# Patient Record
Sex: Male | Born: 1950
Health system: Southern US, Community
[De-identification: ages and names within clinical notes are randomized; demographics above are authoritative.]

## PROBLEM LIST (undated history)

## (undated) DIAGNOSIS — H9319 Tinnitus, unspecified ear: Secondary | ICD-10-CM

## (undated) DIAGNOSIS — E0590 Thyrotoxicosis, unspecified without thyrotoxic crisis or storm: Secondary | ICD-10-CM

## (undated) DIAGNOSIS — R0600 Dyspnea, unspecified: Secondary | ICD-10-CM

## (undated) DIAGNOSIS — I4892 Unspecified atrial flutter: Secondary | ICD-10-CM

## (undated) DIAGNOSIS — Z923 Personal history of irradiation: Secondary | ICD-10-CM

## (undated) DIAGNOSIS — C801 Malignant (primary) neoplasm, unspecified: Secondary | ICD-10-CM

## (undated) DIAGNOSIS — I499 Cardiac arrhythmia, unspecified: Secondary | ICD-10-CM

## (undated) DIAGNOSIS — K219 Gastro-esophageal reflux disease without esophagitis: Secondary | ICD-10-CM

## (undated) DIAGNOSIS — G4700 Insomnia, unspecified: Secondary | ICD-10-CM

## (undated) DIAGNOSIS — E785 Hyperlipidemia, unspecified: Secondary | ICD-10-CM

## (undated) DIAGNOSIS — Z87442 Personal history of urinary calculi: Secondary | ICD-10-CM

## (undated) DIAGNOSIS — R200 Anesthesia of skin: Secondary | ICD-10-CM

## (undated) DIAGNOSIS — G43909 Migraine, unspecified, not intractable, without status migrainosus: Secondary | ICD-10-CM

## (undated) DIAGNOSIS — K589 Irritable bowel syndrome without diarrhea: Secondary | ICD-10-CM

## (undated) DIAGNOSIS — F419 Anxiety disorder, unspecified: Secondary | ICD-10-CM

## (undated) DIAGNOSIS — R42 Dizziness and giddiness: Secondary | ICD-10-CM

## (undated) DIAGNOSIS — M1990 Unspecified osteoarthritis, unspecified site: Secondary | ICD-10-CM

## (undated) DIAGNOSIS — I4891 Unspecified atrial fibrillation: Secondary | ICD-10-CM

## (undated) DIAGNOSIS — R7303 Prediabetes: Secondary | ICD-10-CM

## (undated) DIAGNOSIS — I10 Essential (primary) hypertension: Secondary | ICD-10-CM

## (undated) DIAGNOSIS — N400 Enlarged prostate without lower urinary tract symptoms: Secondary | ICD-10-CM

## (undated) DIAGNOSIS — T148XXA Other injury of unspecified body region, initial encounter: Secondary | ICD-10-CM

## (undated) DIAGNOSIS — K649 Unspecified hemorrhoids: Secondary | ICD-10-CM

## (undated) DIAGNOSIS — C61 Malignant neoplasm of prostate: Secondary | ICD-10-CM

## (undated) DIAGNOSIS — R079 Chest pain, unspecified: Secondary | ICD-10-CM

## (undated) DIAGNOSIS — R202 Paresthesia of skin: Secondary | ICD-10-CM

## (undated) HISTORY — DX: Insomnia, unspecified: G47.00

## (undated) HISTORY — PX: RADIOACTIVE SEED IMPLANT: SHX5150

## (undated) HISTORY — DX: Tinnitus, unspecified ear: H93.19

## (undated) HISTORY — DX: Unspecified atrial fibrillation: I48.91

## (undated) HISTORY — DX: Hyperlipidemia, unspecified: E78.5

## (undated) HISTORY — DX: Anxiety disorder, unspecified: F41.9

## (undated) HISTORY — DX: Irritable bowel syndrome, unspecified: K58.9

## (undated) HISTORY — DX: Migraine, unspecified, not intractable, without status migrainosus: G43.909

## (undated) HISTORY — DX: Essential (primary) hypertension: I10

## (undated) HISTORY — DX: Unspecified atrial flutter: I48.92

## (undated) HISTORY — DX: Unspecified osteoarthritis, unspecified site: M19.90

## (undated) HISTORY — DX: Chest pain, unspecified: R07.9

## (undated) HISTORY — DX: Personal history of irradiation: Z92.3

---

## 1980-07-24 HISTORY — PX: KIDNEY SURGERY: SHX687

## 2002-10-14 ENCOUNTER — Emergency Department (HOSPITAL_COMMUNITY): Admission: EM | Admit: 2002-10-14 | Discharge: 2002-10-14 | Payer: Self-pay

## 2007-03-19 ENCOUNTER — Ambulatory Visit (HOSPITAL_COMMUNITY): Admission: RE | Admit: 2007-03-19 | Discharge: 2007-03-19 | Payer: Self-pay | Admitting: Internal Medicine

## 2010-09-23 ENCOUNTER — Ambulatory Visit (INDEPENDENT_AMBULATORY_CARE_PROVIDER_SITE_OTHER): Payer: Self-pay | Admitting: Urology

## 2010-09-23 DIAGNOSIS — R972 Elevated prostate specific antigen [PSA]: Secondary | ICD-10-CM

## 2010-09-23 DIAGNOSIS — N529 Male erectile dysfunction, unspecified: Secondary | ICD-10-CM

## 2010-09-23 DIAGNOSIS — N400 Enlarged prostate without lower urinary tract symptoms: Secondary | ICD-10-CM

## 2010-10-10 ENCOUNTER — Encounter (HOSPITAL_COMMUNITY): Payer: Self-pay | Attending: Urology

## 2010-10-10 ENCOUNTER — Other Ambulatory Visit: Payer: Self-pay | Admitting: Urology

## 2010-10-10 DIAGNOSIS — Z01810 Encounter for preprocedural cardiovascular examination: Secondary | ICD-10-CM | POA: Insufficient documentation

## 2010-10-10 DIAGNOSIS — Z01812 Encounter for preprocedural laboratory examination: Secondary | ICD-10-CM | POA: Insufficient documentation

## 2010-10-10 LAB — BASIC METABOLIC PANEL
BUN: 10 mg/dL (ref 6–23)
CO2: 30 mEq/L (ref 19–32)
Calcium: 9.6 mg/dL (ref 8.4–10.5)
Chloride: 100 mEq/L (ref 96–112)
Creatinine, Ser: 0.95 mg/dL (ref 0.4–1.5)
GFR calc Af Amer: >60 mL/min (ref >60)
GFR calc non Af Amer: >60 mL/min (ref >60)
Glucose, Bld: 108 mg/dL — ABNORMAL HIGH (ref 70–99)
Potassium: 2.9 mEq/L — ABNORMAL LOW (ref 3.5–5.1)
Sodium: 142 mEq/L (ref 135–145)

## 2010-10-10 LAB — SURGICAL PCR SCREEN
MRSA, PCR: NEGATIVE
Staphylococcus aureus: NEGATIVE

## 2010-10-10 LAB — HEMOGLOBIN AND HEMATOCRIT, BLOOD
HCT: 42.8 % (ref 39.0–52.0)
Hemoglobin: 14.7 g/dL (ref 13.0–17.0)

## 2010-10-13 ENCOUNTER — Encounter (HOSPITAL_COMMUNITY): Payer: Self-pay | Attending: Urology

## 2010-10-13 ENCOUNTER — Other Ambulatory Visit: Payer: Self-pay | Admitting: Urology

## 2010-10-13 DIAGNOSIS — Z01812 Encounter for preprocedural laboratory examination: Secondary | ICD-10-CM | POA: Insufficient documentation

## 2010-10-13 DIAGNOSIS — R972 Elevated prostate specific antigen [PSA]: Secondary | ICD-10-CM | POA: Insufficient documentation

## 2010-10-13 LAB — POTASSIUM: Potassium: 3.1 mEq/L — ABNORMAL LOW (ref 3.5–5.1)

## 2010-10-14 ENCOUNTER — Other Ambulatory Visit: Payer: Self-pay | Admitting: Urology

## 2010-10-14 ENCOUNTER — Ambulatory Visit (HOSPITAL_COMMUNITY): Payer: Self-pay

## 2010-10-14 ENCOUNTER — Ambulatory Visit (HOSPITAL_COMMUNITY)
Admission: RE | Admit: 2010-10-14 | Discharge: 2010-10-14 | Disposition: A | Discharge status: Home / Self Care | Payer: Self-pay | Source: Ambulatory Visit | Attending: Urology | Admitting: Urology

## 2010-10-14 DIAGNOSIS — C61 Malignant neoplasm of prostate: Secondary | ICD-10-CM | POA: Insufficient documentation

## 2010-10-14 DIAGNOSIS — I10 Essential (primary) hypertension: Secondary | ICD-10-CM | POA: Insufficient documentation

## 2010-10-14 DIAGNOSIS — Z79899 Other long term (current) drug therapy: Secondary | ICD-10-CM | POA: Insufficient documentation

## 2010-10-14 DIAGNOSIS — R972 Elevated prostate specific antigen [PSA]: Secondary | ICD-10-CM | POA: Insufficient documentation

## 2010-10-14 DIAGNOSIS — Z7982 Long term (current) use of aspirin: Secondary | ICD-10-CM | POA: Insufficient documentation

## 2010-10-18 NOTE — Op Note (Signed)
  NAME:  Steven [name], Steven [name]                ACCOUNT NO.:  [id number]  MEDICAL RECORD NO.:  [id number]           PATIENT TYPE:  O  LOCATION:  DAYP                          FACILITY:  [facility]  PHYSICIAN:  Excell Seltzer. Annabell Howells, M.D.    DATE OF BIRTH:  [birth year]  DATE OF PROCEDURE:  10/14/2010 DATE OF DISCHARGE:                              OPERATIVE REPORT   PROCEDURE:  Prostate ultrasound with ultrasound-guided prostate biopsy.  PREOPERATIVE DIAGNOSIS:  Elevated PSA.  POSTOPERATIVE DIAGNOSIS:  Elevated PSA.  SURGEON:  Excell Seltzer. Annabell Howells, MD  ANESTHESIA:  MAC.  SPECIMEN:  Twelve prostate cores.  COMPLICATIONS:  None.  BLOOD LOSS:  Minimal.  INDICATIONS:  [name] is a 60 year old African American male with an elevated PSA of 4.73 who is to undergo an ultrasound-guided prostate biopsy.  FINDINGS OF PROCEDURE:  He was given preoperative antibiotics with Cipro 400 mg IV and gentamicin 160 mg IV.  He was taken to the operating room where he was placed in lithotomy position and MAC anesthesia was induced.  The ultrasound probe was assembled, imbricated and inserted. Scanning was performed.  The study revealed normal seminal vesicles. There was some minor cystic changes in the apical transitional zone but no other hypoechoic lesions or calcifications were noted.  He had only a small middle lobe.  His prostatic dimensions were at length of 3.95 cm, a height of 3.67 cm, and with 5.49 cm with a volume of 41.7 mL.  After completion of diagnostic scan, he underwent 12-core biopsies in the standard configuration with lateral and medial biopsies from the base, mid and apical prostate on each side.  There was minimal bleeding from the rectum associated with the biopsies and after completion of the biopsies, the probe was removed.  The patient was taken down from lithotomy position. His anesthetic was reversed.  He was made the recovery room in stable condition.  There were no complications.     Excell Seltzer.  Annabell Howells, M.D.     JJW/MEDQ  D:  10/14/2010  T:  10/14/2010  Job:  [id number]  cc:   [facility] Department  Electronically Signed by Bjorn Pippin M.D. on 10/18/2010 11:21:19 AM

## 2010-11-11 ENCOUNTER — Ambulatory Visit (INDEPENDENT_AMBULATORY_CARE_PROVIDER_SITE_OTHER): Payer: Self-pay | Admitting: Urology

## 2010-11-11 DIAGNOSIS — C61 Malignant neoplasm of prostate: Secondary | ICD-10-CM

## 2010-11-21 ENCOUNTER — Ambulatory Visit: Payer: Self-pay | Admitting: Radiation Oncology

## 2010-12-12 ENCOUNTER — Ambulatory Visit
Admission: RE | Admit: 2010-12-12 | Discharge: 2010-12-12 | Disposition: A | Discharge status: Home / Self Care | Payer: Self-pay | Source: Ambulatory Visit | Attending: Radiation Oncology | Admitting: Radiation Oncology

## 2010-12-12 DIAGNOSIS — C61 Malignant neoplasm of prostate: Secondary | ICD-10-CM | POA: Insufficient documentation

## 2010-12-12 DIAGNOSIS — M129 Arthropathy, unspecified: Secondary | ICD-10-CM | POA: Insufficient documentation

## 2010-12-12 DIAGNOSIS — Z7982 Long term (current) use of aspirin: Secondary | ICD-10-CM | POA: Insufficient documentation

## 2010-12-12 DIAGNOSIS — Z87891 Personal history of nicotine dependence: Secondary | ICD-10-CM | POA: Insufficient documentation

## 2010-12-12 DIAGNOSIS — I10 Essential (primary) hypertension: Secondary | ICD-10-CM | POA: Insufficient documentation

## 2010-12-12 DIAGNOSIS — Z8042 Family history of malignant neoplasm of prostate: Secondary | ICD-10-CM | POA: Insufficient documentation

## 2010-12-12 DIAGNOSIS — Z79899 Other long term (current) drug therapy: Secondary | ICD-10-CM | POA: Insufficient documentation

## 2011-04-10 ENCOUNTER — Ambulatory Visit
Admission: RE | Admit: 2011-04-10 | Discharge: 2011-04-10 | Disposition: A | Discharge status: Home / Self Care | Payer: Self-pay | Source: Ambulatory Visit | Attending: Radiation Oncology | Admitting: Radiation Oncology

## 2011-07-19 ENCOUNTER — Telehealth: Payer: Self-pay | Admitting: *Deleted

## 2011-07-19 NOTE — Telephone Encounter (Signed)
[name] from [facility] stating they were putting in gold seeds their office today, will have pt call Steven [name] to have follow up with [name] 4:37 PM

## 2011-07-21 ENCOUNTER — Telehealth: Payer: Self-pay | Admitting: *Deleted

## 2011-07-26 ENCOUNTER — Ambulatory Visit: Admission: RE | Admit: 2011-07-26 | Payer: Self-pay | Source: Ambulatory Visit

## 2011-07-26 ENCOUNTER — Ambulatory Visit: Admission: RE | Admit: 2011-07-26 | Payer: Self-pay | Source: Ambulatory Visit | Admitting: Radiation Oncology

## 2011-08-09 ENCOUNTER — Ambulatory Visit
Admission: RE | Admit: 2011-08-09 | Discharge: 2011-08-09 | Disposition: A | Discharge status: Home / Self Care | Payer: Self-pay | Source: Ambulatory Visit | Attending: Radiation Oncology | Admitting: Radiation Oncology

## 2011-08-09 DIAGNOSIS — F419 Anxiety disorder, unspecified: Secondary | ICD-10-CM

## 2011-08-09 DIAGNOSIS — F411 Generalized anxiety disorder: Secondary | ICD-10-CM | POA: Insufficient documentation

## 2011-08-09 DIAGNOSIS — C61 Malignant neoplasm of prostate: Secondary | ICD-10-CM

## 2011-08-09 MED ORDER — LORAZEPAM 1 MG PO TABS
1.0000 mg | ORAL_TABLET | Freq: Once | ORAL | Status: AC
  Administered 2011-08-09: 1 mg via SUBLINGUAL
  Filled 2011-08-09: qty 1

## 2011-08-09 NOTE — Progress Notes (Signed)
Met with patient to discuss RO billing.   Gave patient epp and another MCD application.  Patient stated he has applied previously, but was denied.  Patient is very concerned about bills. Will return paperwork on return visit 11/28/152.  Called Kathrin Penner, CSW, to meet with patient to discuss any further transportation options.

## 2011-08-09 NOTE — Progress Notes (Signed)
When pt was leaving at 1225, met pt at the elevator,  pt ambulating with  steady gait, alert oriented x 3, thanked me &  said"I got through that ok, that medication helped a lot, " son,&  wife at pts side,2:22 PM

## 2011-08-13 DIAGNOSIS — C61 Malignant neoplasm of prostate: Secondary | ICD-10-CM

## 2011-08-13 NOTE — Progress Notes (Signed)
DIAGNOSIS:  Prostate cancer.  NARRATIVE:  On August 08, 2010 Mr. Morikawa underwent treatment planning to begin radiation therapy directed at the prostate area.  The patient was placed on the CT simulator table. Prior to this, the patient was given 0.5 mg of Ativan sublingual in light of his anxiety.  The patient had setup of a customized alpha cradle for positioning purposes. Patient then had a rectal tube placed with contrast instilled into the rectal vault.  The patient also had a bladder catheterization with contrast instilled into the bladder and urethra.  The patient then proceeded to undergo CT scan in the treatment position.  Under virtual simulation, the patient's prostate was noted and outlined as well as the fiducial markers.  The bladder as well as rectum and femoral head and neck areas were also outlined.  Patient then had generation of a PTV 7800 cGy, which will encompass the prostate with an 8 mm margin except on the rectal interface that being 5 mm.  The patient will proceed with planning using IMRT (volumetric arc therapy).  TREATMENT PLAN:  The patient is to proceed with IMRT using the volumetric arc therapy on the TrueBeam STx.  The patient be treated with 2 RapidArc using 6 or 10 mg photons.  The patient will receive 40 treatments at 195 cGy per day for a cumulative dose of 7800 cGy.    ______________________________ Billie Lade, Ph.D., M.D. JDK/MEDQ  D:  08/13/2011  T:  08/13/2011  Job:  [id number]

## 2011-08-21 ENCOUNTER — Telehealth: Payer: Self-pay | Admitting: Radiation Oncology

## 2011-08-21 ENCOUNTER — Ambulatory Visit
Admission: RE | Admit: 2011-08-21 | Discharge: 2011-08-21 | Disposition: A | Discharge status: Home / Self Care | Payer: Self-pay | Source: Ambulatory Visit | Attending: Radiation Oncology | Admitting: Radiation Oncology

## 2011-08-21 NOTE — Telephone Encounter (Signed)
INDIGENT APPROVED 100%  Patient recvd a $50 gas card today. Pt has no insurance.  [facility]  [phone]

## 2011-08-22 ENCOUNTER — Ambulatory Visit
Admission: RE | Admit: 2011-08-22 | Discharge: 2011-08-22 | Disposition: A | Discharge status: Home / Self Care | Payer: Self-pay | Source: Ambulatory Visit | Attending: Radiation Oncology | Admitting: Radiation Oncology

## 2011-08-22 VITALS — Wt 208.2 lb

## 2011-08-22 DIAGNOSIS — C61 Malignant neoplasm of prostate: Secondary | ICD-10-CM

## 2011-08-22 NOTE — Progress Notes (Signed)
Post sim ed done, gave pt "Radiation and You" booklet. All questions answered.

## 2011-08-23 ENCOUNTER — Ambulatory Visit
Admission: RE | Admit: 2011-08-23 | Discharge: 2011-08-23 | Disposition: A | Discharge status: Home / Self Care | Payer: Self-pay | Source: Ambulatory Visit | Attending: Radiation Oncology | Admitting: Radiation Oncology

## 2011-08-23 NOTE — Progress Notes (Signed)
CC:   Excell Seltzer. Annabell Howells, M.D. Health Department [facility]  DIAGNOSIS:  Prostate cancer.  NARRATIVE:  Mr. Dotson is seen today for weekly assessment.  He has completed 2/40 planned treatments directed at the prostate area (390 cGy of a planned 7800 cGy).  The patient is tolerating his treatments well at this time without any side effects.  EXAMINATION:  The patient's weight is 208.2 pounds.  Lungs:  The lungs are clear.  Heart:  The heart has a regular rhythm and rate.  Abdomen: Soft and nontender with normal bowel sounds.  IMPRESSION AND PLAN:  The patient is tolerating his radiation treatments well.  His radiation fields are setting up accurately.  The patient's radiation chart was checked today.  Plan is to continue to a cumulative dose of 7800 cGy.    ______________________________ Billie Lade, Ph.D., M.D. JDK/MEDQ  D:  08/22/2011  T:  08/22/2011  Job:  [id number]

## 2011-08-24 ENCOUNTER — Ambulatory Visit
Admission: RE | Admit: 2011-08-24 | Discharge: 2011-08-24 | Disposition: A | Discharge status: Home / Self Care | Payer: Self-pay | Source: Ambulatory Visit | Attending: Radiation Oncology | Admitting: Radiation Oncology

## 2011-08-25 ENCOUNTER — Ambulatory Visit
Admission: RE | Admit: 2011-08-25 | Discharge: 2011-08-25 | Disposition: A | Discharge status: Home / Self Care | Payer: Self-pay | Source: Ambulatory Visit | Attending: Radiation Oncology | Admitting: Radiation Oncology

## 2011-08-28 ENCOUNTER — Ambulatory Visit
Admission: RE | Admit: 2011-08-28 | Discharge: 2011-08-28 | Disposition: A | Discharge status: Home / Self Care | Payer: Self-pay | Source: Ambulatory Visit | Attending: Radiation Oncology | Admitting: Radiation Oncology

## 2011-08-29 ENCOUNTER — Ambulatory Visit
Admission: RE | Admit: 2011-08-29 | Discharge: 2011-08-29 | Disposition: A | Discharge status: Home / Self Care | Payer: Self-pay | Source: Ambulatory Visit | Attending: Radiation Oncology | Admitting: Radiation Oncology

## 2011-08-29 ENCOUNTER — Encounter: Payer: Self-pay | Admitting: Radiation Oncology

## 2011-08-29 VITALS — BP 116/71 | HR 69 | Temp 97.3°F | Resp 20 | Wt 205.6 lb

## 2011-08-29 DIAGNOSIS — C61 Malignant neoplasm of prostate: Secondary | ICD-10-CM

## 2011-08-29 NOTE — Progress Notes (Signed)
Pt gets up 1 x night to void,  up at 5 am, then voids q 2 hours, not emptying bladder at at times completely, having trouble constipation, takes miralax daily prn, pt wanting b/p checked, wnl,  .now

## 2011-08-30 ENCOUNTER — Ambulatory Visit
Admission: RE | Admit: 2011-08-30 | Discharge: 2011-08-30 | Disposition: A | Discharge status: Home / Self Care | Payer: Self-pay | Source: Ambulatory Visit | Attending: Radiation Oncology | Admitting: Radiation Oncology

## 2011-08-30 NOTE — Progress Notes (Signed)
DIAGNOSIS:  Prostate cancer.  NARRATIVE:  Steven [name] is seen today for weekly assessment.  He has completed 7/40 planned treatments directed at the prostate area (1365 cGy of a planned 7800 cGy).  The patient is tolerating his treatments well.  He has his usual nocturia which varies between one and four times per evening.  The patient does sometimes sleep, however, 12 hours in light of his late shift schedule.  The patient denies any dysuria or bowel complaints.  He has actually had some intermittent mild constipation.  The patient denies any fatigue at this time.  PHYSICAL EXAMINATION:  Vital Signs:  The patient's temperature is 97.3, pulse 69, respirations 20, blood pressure is 116/71.  The patient's weight is 205.6 pounds.  The lungs are clear.  The heart has a regular rhythm and rate.  The abdomen is soft and nontender with normal bowel sounds.  IMPRESSION AND PLAN:  The patient is tolerating his treatments well at this time.  The patient's radiation fields are setting up accurately. The patient's radiation chart was checked today.  The plan is to continue with image-guided IMRT to a cumulative dose of 7800 cGy.    ______________________________ Billie Lade, Ph.D., M.D. JDK/MEDQ  D:  08/29/2011  T:  08/30/2011  Job:  [id number]

## 2011-08-31 ENCOUNTER — Ambulatory Visit
Admission: RE | Admit: 2011-08-31 | Discharge: 2011-08-31 | Disposition: A | Discharge status: Home / Self Care | Payer: Self-pay | Source: Ambulatory Visit | Attending: Radiation Oncology | Admitting: Radiation Oncology

## 2011-09-01 ENCOUNTER — Ambulatory Visit
Admission: RE | Admit: 2011-09-01 | Discharge: 2011-09-01 | Disposition: A | Discharge status: Home / Self Care | Payer: Self-pay | Source: Ambulatory Visit | Attending: Radiation Oncology | Admitting: Radiation Oncology

## 2011-09-04 ENCOUNTER — Ambulatory Visit
Admission: RE | Admit: 2011-09-04 | Discharge: 2011-09-04 | Disposition: A | Discharge status: Home / Self Care | Payer: Self-pay | Source: Ambulatory Visit | Attending: Radiation Oncology | Admitting: Radiation Oncology

## 2011-09-05 ENCOUNTER — Ambulatory Visit
Admission: RE | Admit: 2011-09-05 | Discharge: 2011-09-05 | Disposition: A | Discharge status: Home / Self Care | Payer: Self-pay | Source: Ambulatory Visit | Attending: Radiation Oncology | Admitting: Radiation Oncology

## 2011-09-05 ENCOUNTER — Encounter: Payer: Self-pay | Admitting: Radiation Oncology

## 2011-09-05 DIAGNOSIS — C61 Malignant neoplasm of prostate: Secondary | ICD-10-CM

## 2011-09-05 NOTE — Progress Notes (Signed)
[facility]    Radiation Oncology [address] [place]     Maryln Gottron, M.D. [place], [place] [address]               Billie Lade, M.D., Ph.D. Phone: [phone]      Molli Hazard A. Kathrynn Running, M.D. Fax: [phone]      Radene Gunning, M.D., Ph.D.         Lurline Hare, M.D.         Grayland Jack, M.D Weekly Treatment Management Note  Name: Steven [name]     MRN: [id number]        CSN: [id number] Date: 09/05/2011      DOB: 1951-05-26  CC: Steven Kelch, NP, NP         [name]    Status: Outpatient  Diagnosis: The encounter diagnosis was Prostate cancer.  Current Dose: 2340 cGy  Current Fraction: 12  Planned Dose: 7800  Narrative: Steven [name] was seen today for weekly treatment management. The chart was checked and KV/KV  were reviewed. Pt has noticed some bladder pressure with urination, mild increased frequency.  No bowel c/o's.  Continues to work at night.  Review of patient's allergies indicates no known allergies. No current outpatient prescriptions on file.   Labs:  Lab Results  Component Value Date   HGB 14.7 10/10/2010   HCT 42.8 10/10/2010   Lab Results  Component Value Date   CREATININE 0.95 10/10/2010   BUN 10 10/10/2010   NA 142 10/10/2010   K 3.1* 10/13/2010   CL 100 10/10/2010   CO2 30 10/10/2010   No results found for this basename: ALT, AST, GGT, ALK, PHOS, BILITOT    Physical Examination:  vitals were not taken for this visit.   Wt Readings from Last 3 Encounters:  09/05/11 201 lb (91.173 kg)  08/29/11 205 lb 9.6 oz (93.26 kg)  08/22/11 208 lb 3.2 oz (94.439 kg)     Lungs - Normal respiratory effort, chest expands symmetrically. Lungs are clear to auscultation, no crackles or wheezes.  Heart has regular rhythm and rate  Abdomen is soft and non tender with normal bowel sounds  Assessment:  Patient tolerating treatments well  Plan: Continue treatment per original radiation prescription

## 2011-09-05 NOTE — Progress Notes (Signed)
C/o mild pain in bladder area, kinda like a pressure.  frequency and urgency.

## 2011-09-06 ENCOUNTER — Ambulatory Visit
Admission: RE | Admit: 2011-09-06 | Discharge: 2011-09-06 | Disposition: A | Discharge status: Home / Self Care | Payer: Self-pay | Source: Ambulatory Visit | Attending: Radiation Oncology | Admitting: Radiation Oncology

## 2011-09-07 ENCOUNTER — Ambulatory Visit
Admission: RE | Admit: 2011-09-07 | Discharge: 2011-09-07 | Disposition: A | Discharge status: Home / Self Care | Payer: Self-pay | Source: Ambulatory Visit | Attending: Radiation Oncology | Admitting: Radiation Oncology

## 2011-09-08 ENCOUNTER — Ambulatory Visit
Admission: RE | Admit: 2011-09-08 | Discharge: 2011-09-08 | Disposition: A | Discharge status: Home / Self Care | Payer: Self-pay | Source: Ambulatory Visit | Attending: Radiation Oncology | Admitting: Radiation Oncology

## 2011-09-11 ENCOUNTER — Ambulatory Visit
Admission: RE | Admit: 2011-09-11 | Discharge: 2011-09-11 | Disposition: A | Discharge status: Home / Self Care | Payer: Self-pay | Source: Ambulatory Visit | Attending: Radiation Oncology | Admitting: Radiation Oncology

## 2011-09-12 ENCOUNTER — Ambulatory Visit
Admission: RE | Admit: 2011-09-12 | Discharge: 2011-09-12 | Disposition: A | Discharge status: Home / Self Care | Payer: Self-pay | Source: Ambulatory Visit | Attending: Radiation Oncology | Admitting: Radiation Oncology

## 2011-09-12 ENCOUNTER — Encounter: Payer: Self-pay | Admitting: Radiation Oncology

## 2011-09-12 VITALS — Wt 209.0 lb

## 2011-09-12 DIAGNOSIS — C61 Malignant neoplasm of prostate: Secondary | ICD-10-CM

## 2011-09-12 NOTE — Progress Notes (Signed)
Pt gets up at 3am to void and then q 2 hours,  No burning, stinging, just frequency,urgency, bowel mon=vements ok, still takes stool softner off and on, no other c/o's 4:00 PM

## 2011-09-12 NOTE — Progress Notes (Signed)
CC:   Steven [name]. Annabell Howells, M.D.  DIAGNOSIS:  Prostate cancer.  NARRATIVE:  [name] is seen today for weekly assessment. He has completed 3315 cGy of a planned 7800 cGy directed at the prostate area. The patient has started noticing some urinary frequency with no dysuria. In addition, he is having nocturia approximately every 2 hours.  Due to limited funds, I have asked the patient speak with Dr. Annabell Howells to see if there may be some samples he could obtain for his nocturia.  The patient continues to work his usual evening schedule.  The patient denies any hematuria, or bowel complaints at this time.  EXAMINATION:  The patient's weight is 209 pounds.  This seems to be stable since late January.  Examination of the lungs reveals them to be clear.  The heart has a regular rhythm and rate.  The abdomen is soft and nontender with normal bowel sounds.  IMPRESSION AND PLAN:  The patient is tolerating his treatments reasonably well except for issues as above.  The patient's radiation fields are setting up accurately.  The patient's radiation chart was checked today.  Plan is to continue with image-guided IMRT to a cumulative dose of 7800 cGy.    ______________________________ Steven [name], Ph.D., M.D. JDK/MEDQ  D:  09/12/2011  T:  09/12/2011  Job:  [id number]

## 2011-09-13 ENCOUNTER — Ambulatory Visit
Admission: RE | Admit: 2011-09-13 | Discharge: 2011-09-13 | Disposition: A | Discharge status: Home / Self Care | Payer: Self-pay | Source: Ambulatory Visit | Attending: Radiation Oncology | Admitting: Radiation Oncology

## 2011-09-14 ENCOUNTER — Ambulatory Visit
Admission: RE | Admit: 2011-09-14 | Discharge: 2011-09-14 | Disposition: A | Discharge status: Home / Self Care | Payer: Self-pay | Source: Ambulatory Visit | Attending: Radiation Oncology | Admitting: Radiation Oncology

## 2011-09-15 ENCOUNTER — Ambulatory Visit
Admission: RE | Admit: 2011-09-15 | Discharge: 2011-09-15 | Disposition: A | Discharge status: Home / Self Care | Payer: Self-pay | Source: Ambulatory Visit | Attending: Radiation Oncology | Admitting: Radiation Oncology

## 2011-09-15 NOTE — Telephone Encounter (Signed)
xxx

## 2011-09-18 ENCOUNTER — Ambulatory Visit
Admission: RE | Admit: 2011-09-18 | Discharge: 2011-09-18 | Disposition: A | Discharge status: Home / Self Care | Payer: Self-pay | Source: Ambulatory Visit | Attending: Radiation Oncology | Admitting: Radiation Oncology

## 2011-09-19 ENCOUNTER — Ambulatory Visit
Admission: RE | Admit: 2011-09-19 | Discharge: 2011-09-19 | Disposition: A | Discharge status: Home / Self Care | Payer: Self-pay | Source: Ambulatory Visit | Attending: Radiation Oncology | Admitting: Radiation Oncology

## 2011-09-19 ENCOUNTER — Encounter: Payer: Self-pay | Admitting: Radiation Oncology

## 2011-09-19 VITALS — Wt 208.1 lb

## 2011-09-19 DIAGNOSIS — C61 Malignant neoplasm of prostate: Secondary | ICD-10-CM

## 2011-09-19 NOTE — Progress Notes (Signed)
DIAGNOSIS:  Prostate cancer.  NARRATIVE:  Steven [name] was seen today for weekly assessment.  He has completed 22/40 planned treatments directed at the prostate gland (4290 cGy of a planned 7800 cGy).  The patient was having increased urinary frequency and urgency last week.  He did see Dr. Annabell Howells and was given a prescription for medicine to address this issue.  I have asked the patient to bring this in tomorrow for evaluation.  The patient feels this has helped some with his urinary frequency and urgency.  The patient has nocturia 1-2 times.  He denies any hematuria.  His medication is turning his urine a bluish color.  The patient has noticed some flare-up of his hemorrhoidal discomfort and started using Preparation H for this issue.  The patient denies any rectal bleeding. 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  PHYSICAL EXAMINATION:  The patient's weight is 208 pounds which is stable.  The lungs are clear.  The heart has regular rhythm and rate. Abdomen:  Soft, nontender with normal bowel sounds.  IMPRESSION AND PLAN:  The patient is tolerating his radiation treatments well at this time.  The patient's cone beam CT scans are showing accurate placement.  The patient's radiation chart was checked today. Plan is to continue with definitive course of radiation therapy to a cumulative dose of 7800 cGy using image-guided IMRT.    ______________________________ Billie Lade, Ph.D., M.D. JDK/MEDQ  D:  09/19/2011  T:  09/19/2011  Job:  [id number]

## 2011-09-19 NOTE — Progress Notes (Signed)
Here for routine weekly MD visit for prostate ca. Has completed 22 of 40 radiation treatments to pelvis. Nocturia x 2. Denies burning. Has occasional frequency and urgency and feeling of pressure on completion of urinating. Has started medication (urobel) not sure of spelling per Dr.Wrenn on 09/14/2011. Will bring in med tomorrow for proper documentation.

## 2011-09-20 ENCOUNTER — Ambulatory Visit
Admission: RE | Admit: 2011-09-20 | Discharge: 2011-09-20 | Disposition: A | Discharge status: Home / Self Care | Payer: Self-pay | Source: Ambulatory Visit | Attending: Radiation Oncology | Admitting: Radiation Oncology

## 2011-09-21 ENCOUNTER — Telehealth: Payer: Self-pay

## 2011-09-21 ENCOUNTER — Ambulatory Visit
Admission: RE | Admit: 2011-09-21 | Discharge: 2011-09-21 | Disposition: A | Discharge status: Home / Self Care | Payer: Self-pay | Source: Ambulatory Visit | Attending: Radiation Oncology | Admitting: Radiation Oncology

## 2011-09-22 ENCOUNTER — Ambulatory Visit
Admission: RE | Admit: 2011-09-22 | Discharge: 2011-09-22 | Disposition: A | Discharge status: Home / Self Care | Payer: Self-pay | Source: Ambulatory Visit | Attending: Radiation Oncology | Admitting: Radiation Oncology

## 2011-09-25 ENCOUNTER — Ambulatory Visit
Admission: RE | Admit: 2011-09-25 | Discharge: 2011-09-25 | Disposition: A | Discharge status: Home / Self Care | Payer: Self-pay | Source: Ambulatory Visit | Attending: Radiation Oncology | Admitting: Radiation Oncology

## 2011-09-26 ENCOUNTER — Ambulatory Visit
Admission: RE | Admit: 2011-09-26 | Discharge: 2011-09-26 | Disposition: A | Discharge status: Home / Self Care | Payer: Self-pay | Source: Ambulatory Visit | Attending: Radiation Oncology | Admitting: Radiation Oncology

## 2011-09-26 ENCOUNTER — Encounter: Payer: Self-pay | Admitting: Radiation Oncology

## 2011-09-26 ENCOUNTER — Telehealth: Payer: Self-pay

## 2011-09-26 VITALS — Wt 208.8 lb

## 2011-09-26 DIAGNOSIS — C61 Malignant neoplasm of prostate: Secondary | ICD-10-CM

## 2011-09-26 NOTE — Telephone Encounter (Signed)
e

## 2011-09-26 NOTE — Progress Notes (Signed)
Here for routine weekly Md visit for radiation of prostate.Has completed 27 of 40 treatments. Continues to have frequency through out day but has nocturia every 2.5 hours. Forgot to bring in medication. Denies pain.Bowels normal at least every morning.

## 2011-09-26 NOTE — Progress Notes (Signed)
DIAGNOSIS:  Prostate cancer.  NARRATIVE:  Mr. Staron was seen today for weekly assessment.  He has completed 5265 cGy of a planned 7800 cGy.  The patient does have some urinary frequency which has helped with his samples from Dr. Wilson Singer.  He has minimal dysuria at this time.  The patient is having more frequent bowel movements and has noticed some irritation to his hemorrhoids.  He is using Preparation H on occasion.  The patient denies any rectal bleeding or obvious hematuria.  The patient does have some fatigue but continues to work his usual schedule.  EXAMINATION:  Lungs:  Clear.  Heart:  Regular rhythm and rate.  Abdomen: Soft and nontender with normal bowel sounds.  IMPRESSION AND PLAN:  The patient is tolerating his radiation treatments reasonably well at this time.  The patient's radiation fields are setting up accurately.  The patient's radiation chart was checked today. Plan is to continue to a cumulative dose of 7800 cGy with image-guided IMRT.    ______________________________ Billie Lade, Ph.D., M.D. JDK/MEDQ  D:  09/26/2011  T:  09/26/2011  Job:  [id number]

## 2011-09-26 NOTE — Telephone Encounter (Signed)
error 

## 2011-09-27 ENCOUNTER — Ambulatory Visit
Admission: RE | Admit: 2011-09-27 | Discharge: 2011-09-27 | Disposition: A | Discharge status: Home / Self Care | Payer: Self-pay | Source: Ambulatory Visit | Attending: Radiation Oncology | Admitting: Radiation Oncology

## 2011-09-27 NOTE — Letter (Signed)
September 26, 2011  Faxed 09/27/2011/bjp  [facility]old[facility] Laws [facility] Fax #[phone]  NAME:  Steven [name], Steven [name] MRN:  [id number] DOB:  30-Oct-1950  Steven [name] is currently under my care for early stage prostate cancer (stage T1c).  Patient is receiving a curative course of radiation therapy extending over 8 weeks (40 treatments).  The patient is tolerating his treatments well and continues to work his usual schedule.  Steven [name] has a good prognosis and I am hopeful for cure with his planned course of radiation therapy.  I hope this information is helpful.  Please let me know if you have any additional questions.   Billie Lade, Ph.D., M.D.  JDK/MEDQ  D:  09/26/2011  T:  09/26/2011  Job:  [id number]

## 2011-09-28 ENCOUNTER — Ambulatory Visit
Admission: RE | Admit: 2011-09-28 | Discharge: 2011-09-28 | Disposition: A | Discharge status: Home / Self Care | Payer: Self-pay | Source: Ambulatory Visit | Attending: Radiation Oncology | Admitting: Radiation Oncology

## 2011-09-29 ENCOUNTER — Ambulatory Visit
Admission: RE | Admit: 2011-09-29 | Discharge: 2011-09-29 | Disposition: A | Discharge status: Home / Self Care | Payer: Self-pay | Source: Ambulatory Visit | Attending: Radiation Oncology | Admitting: Radiation Oncology

## 2011-10-02 ENCOUNTER — Ambulatory Visit
Admission: RE | Admit: 2011-10-02 | Discharge: 2011-10-02 | Disposition: A | Discharge status: Home / Self Care | Payer: Self-pay | Source: Ambulatory Visit | Attending: Radiation Oncology | Admitting: Radiation Oncology

## 2011-10-03 ENCOUNTER — Ambulatory Visit
Admission: RE | Admit: 2011-10-03 | Discharge: 2011-10-03 | Disposition: A | Discharge status: Home / Self Care | Payer: Self-pay | Source: Ambulatory Visit | Attending: Radiation Oncology | Admitting: Radiation Oncology

## 2011-10-03 ENCOUNTER — Encounter: Payer: Self-pay | Admitting: Radiation Oncology

## 2011-10-03 VITALS — Wt 206.4 lb

## 2011-10-03 DIAGNOSIS — C61 Malignant neoplasm of prostate: Secondary | ICD-10-CM

## 2011-10-03 NOTE — Progress Notes (Signed)
Pt reports nocturia x 2, daytime freq q 2hrs. Denies dysuria. BM's more freq, nl consistency. C/o painful hemorrhoids today, uses Prep H. Increased flatus.  Pt states he is occass having "general all over chest pains". He feels it may be "stress or gas". No hx heart issues, has had stress test in past WNL. Advised he seek medical attention if chest pains cont, worsen, and /or are associated w/tingling, numbness, other pain.

## 2011-10-03 NOTE — Progress Notes (Signed)
Weekly Management Note Current Dose: 62.4  Gy  Projected Dose: 78 Gy   Narrative:  The patient presents for routine under treatment assessment.  CBCT/MVCT images/Port film x-rays were reviewed.  The chart was checked. No dysuria. Some frequency but tolerable. 3-4 bm per day. Loose but no watery diarrhea.  Has had chest pressure at night Once every 2 weeks. Not present during day or when he is up and moving around.  No coronary history.  Wonders if it is due to mold he has in his bathroom. No chronic cough, hemotypsis or fevers.   Physical Findings: Weight: 206 lb 6.4 oz (93.622 kg). Alert awake. No distress  Impression:  The patient is tolerating radiation.  Plan:  Continue treatment as planned. Discuss increased ventilation of bathroom. No symptoms of chest infection. Continue treatment.

## 2011-10-04 ENCOUNTER — Ambulatory Visit
Admission: RE | Admit: 2011-10-04 | Discharge: 2011-10-04 | Disposition: A | Discharge status: Home / Self Care | Payer: Self-pay | Source: Ambulatory Visit | Attending: Radiation Oncology | Admitting: Radiation Oncology

## 2011-10-05 ENCOUNTER — Ambulatory Visit
Admission: RE | Admit: 2011-10-05 | Discharge: 2011-10-05 | Disposition: A | Discharge status: Home / Self Care | Payer: Self-pay | Source: Ambulatory Visit | Attending: Radiation Oncology | Admitting: Radiation Oncology

## 2011-10-06 ENCOUNTER — Ambulatory Visit: Payer: Self-pay

## 2011-10-06 ENCOUNTER — Ambulatory Visit
Admission: RE | Admit: 2011-10-06 | Discharge: 2011-10-06 | Disposition: A | Discharge status: Home / Self Care | Payer: Self-pay | Source: Ambulatory Visit | Attending: Radiation Oncology | Admitting: Radiation Oncology

## 2011-10-06 DIAGNOSIS — C61 Malignant neoplasm of prostate: Secondary | ICD-10-CM | POA: Insufficient documentation

## 2011-10-09 ENCOUNTER — Ambulatory Visit
Admission: RE | Admit: 2011-10-09 | Discharge: 2011-10-09 | Disposition: A | Discharge status: Home / Self Care | Payer: Self-pay | Source: Ambulatory Visit | Attending: Radiation Oncology | Admitting: Radiation Oncology

## 2011-10-09 DIAGNOSIS — F411 Generalized anxiety disorder: Secondary | ICD-10-CM | POA: Insufficient documentation

## 2011-10-10 ENCOUNTER — Ambulatory Visit
Admission: RE | Admit: 2011-10-10 | Discharge: 2011-10-10 | Disposition: A | Discharge status: Home / Self Care | Payer: Self-pay | Source: Ambulatory Visit | Attending: Radiation Oncology | Admitting: Radiation Oncology

## 2011-10-10 ENCOUNTER — Encounter: Payer: Self-pay | Admitting: Radiation Oncology

## 2011-10-10 VITALS — Wt 208.7 lb

## 2011-10-10 DIAGNOSIS — C61 Malignant neoplasm of prostate: Secondary | ICD-10-CM

## 2011-10-10 NOTE — Progress Notes (Signed)
[facility]    Radiation Oncology [address] [place]     Maryln Gottron, M.D. [place], [place] [address]               Billie Lade, M.D., Ph.D. Phone: [phone]      Molli Hazard A. Kathrynn Running, M.D. Fax: [phone]      Radene Gunning, M.D., Ph.D.         Lurline Hare, M.D.         Grayland Jack, M.D Weekly Treatment Management Note  Name: Steven [name]     MRN: [id number]        CSN: [id number] Date: 10/10/2011      DOB: 1951/03/04  CC: Steven Kelch, NP, NP         Steven [name]    Status: Outpatient  Diagnosis: The encounter diagnosis was Prostate cancer.  Current Dose: 7215 cGy  Current Fraction: 37  Planned Dose: 7800  Narrative: Steven [name] was seen today for weekly treatment management. The chart was checked and KV/KV  were reviewed. The the patient continues to tolerate his treatment well. Patient has nocturia approximately every 2 hours but sleeps well in between. Patient has increased bowel frequency but no problems with loose bowels or diarrhea. This continues to work full-time the evening shift.  Review of patient's allergies indicates no known allergies.  Current Outpatient Prescriptions  Medication Sig Dispense Refill  . amLODipine (NORVASC) 10 MG tablet Take 10 mg by mouth daily.      Marland Kitchen aspirin 81 MG tablet Take 81 mg by mouth daily.       Labs:  Lab Results  Component Value Date   HGB 14.7 10/10/2010   HCT 42.8 10/10/2010   Lab Results  Component Value Date   CREATININE 0.95 10/10/2010   BUN 10 10/10/2010   NA 142 10/10/2010   K 3.1* 10/13/2010   CL 100 10/10/2010   CO2 30 10/10/2010   No results found for this basename: ALT, AST, GGT, ALK, PHOS, BILITOT    Physical Examination:  weight is 208 lb 11.2 oz (94.666 kg).    Wt Readings from Last 3 Encounters:  10/10/11 208 lb 11.2 oz (94.666 kg)  10/03/11 206 lb 6.4 oz (93.622 kg)  09/26/11 208 lb 12.8 oz (94.711 kg)     Lungs - Normal respiratory effort, chest expands symmetrically.  Lungs are clear to auscultation, no crackles or wheezes.  Heart has regular rhythm and rate  Abdomen is soft and non tender with normal bowel sounds  Assessment:  Patient tolerating treatments well  Plan: Continue treatment per original radiation prescription

## 2011-10-10 NOTE — Progress Notes (Signed)
Pt alert and orient x 3, denies dysuria, but still gets up q 2 hours to void, states"every now and then he gets a pain on his left rib side,woke him up Saturday morning when he was asleep,lasted 10-12 seconds,"", It happens every now and then ", off urebel ,37/40 rad txs completed so far, not getting enough sleep 3:33 PM

## 2011-10-11 ENCOUNTER — Ambulatory Visit
Admission: RE | Admit: 2011-10-11 | Discharge: 2011-10-11 | Disposition: A | Discharge status: Home / Self Care | Payer: Self-pay | Source: Ambulatory Visit | Attending: Radiation Oncology | Admitting: Radiation Oncology

## 2011-10-12 ENCOUNTER — Ambulatory Visit
Admission: RE | Admit: 2011-10-12 | Discharge: 2011-10-12 | Disposition: A | Discharge status: Home / Self Care | Payer: Self-pay | Source: Ambulatory Visit | Attending: Radiation Oncology | Admitting: Radiation Oncology

## 2011-10-13 ENCOUNTER — Ambulatory Visit
Admission: RE | Admit: 2011-10-13 | Discharge: 2011-10-13 | Disposition: A | Discharge status: Home / Self Care | Payer: Self-pay | Source: Ambulatory Visit | Attending: Radiation Oncology | Admitting: Radiation Oncology

## 2011-11-03 NOTE — Progress Notes (Signed)
CC:   Steven [name]. Steven [name], M.D. Health Department [facility]  DIAGNOSIS:  Stage T1c Gleason 7 adenocarcinoma of the prostate.  INDICATION FOR THERAPY:  Definitive treatment.  TREATMENT DATES:  August 22, 2011, through October 13, 2011.  SITE/DOSE:  Prostate, 7800 cGy in 40 fractions (195 cGy per fraction).  ENERGY/FIELD:  The patient was treated with IMRT using the volumetric arc therapy treatments.  The patient was treated with 2 dynamic conformal arcs using 6 MV photons.  NARRATIVE:  [name] tolerated his treatments quite well.  He was able to continue working his full-time schedule in the evening hours after his treatment.  The patient had minimal urinary symptoms and bowel complaints during the course of his treatments.  FOLLOWUP APPOINTMENT:  One month.    ______________________________ Billie Lade, Ph.D., M.D. JDK/MEDQ  D:  11/02/2011  T:  11/03/2011  Job:  [id number]

## 2011-11-03 NOTE — Progress Notes (Signed)
DIAGNOSIS:  Prostate cancer.  NARRATIVE:  On August 18, 2011, Steven [name] had completion of his IMRT plan directed at the prostate area.  IMRT was chosen over conventional or conformal treatment to more accurately target the area of concern and to limit dose to normal surrounding critical structures.  Dose-volume histograms of the critical structures, as well as target areas are reviewed, accepted, and placed in the patient's chart.  Plan is for the patient to receive 40 treatments at 195 cGy per day for a cumulative dose of 7800 cGy.  6 MV photons will be used to deliver the patient's treatments.  The patient will be treated with 2 dynamic conformal arcs.    ______________________________ Billie Lade, Ph.D., M.D. JDK/MEDQ  D:  11/02/2011  T:  11/03/2011  Job:  [id number]

## 2011-11-06 NOTE — Procedures (Signed)
DIAGNOSIS:  Prostate cancer.  NARRATIVE:  On January 28th, Mr. Kazmierczak had construction of his IMRT device.  The patient will be treated with 2 dynamic conformal arcs using 6 MV photons.  This constitutes 1 IMRT device.    ______________________________ Billie Lade, Ph.D., M.D. JDK/MEDQ  D:  11/02/2011  T:  11/03/2011  Job:  [id number]

## 2011-11-10 ENCOUNTER — Encounter: Payer: Self-pay | Admitting: Radiation Oncology

## 2011-11-13 ENCOUNTER — Ambulatory Visit
Admission: RE | Admit: 2011-11-13 | Discharge: 2011-11-13 | Disposition: A | Discharge status: Home / Self Care | Payer: Self-pay | Source: Ambulatory Visit | Attending: Radiation Oncology | Admitting: Radiation Oncology

## 2011-11-13 ENCOUNTER — Encounter: Payer: Self-pay | Admitting: Radiation Oncology

## 2011-11-13 VITALS — BP 130/75 | HR 67 | Temp 98.2°F | Wt 204.9 lb

## 2011-11-13 DIAGNOSIS — C61 Malignant neoplasm of prostate: Secondary | ICD-10-CM

## 2011-11-13 NOTE — Progress Notes (Signed)
CC:   Steven [name]. Annabell Howells, M.D. Health Department [facility]  DIAGNOSIS:  Prostate cancer.  INTERVAL SINCE RADIATION THERAPY:  1 month.  NARRATIVE:  [name] comes in today for routine followup.  He completed IMRT back on October 13, 2011.  Clinically, the patient seems to be doing well.  He denies any significant urinary symptoms at this time. The patient continues to work full-time with [employer].  The patient has had some problems with hemorrhoidal discomfort and has been using Tucks as well as hemorrhoidal creams.  I have also given the patient a prescription for Anusol HC suppositories if he has a flare of this problem.  Overall, these issues seem to have improved over the past week or so.  The patient denies any significant rectal bleeding.  EXAMINATION:  Vital Signs:  Weight is down approximately 4 pounds since his weighing last month to 204 pounds.  Temperature 98.2, pulse 67, blood pressure is 130/75.  Examination of the neck and supraclavicular region reveals no evidence of adenopathy.  The axillary areas are free of adenopathy.  Examination of the lungs reveals them to be clear. Heart:  Regular rhythm and rate.  Examination the abdomen reveals it to be soft and nontender with normal bowel sounds.  There is no obvious hepatosplenomegaly.  Rectal exam is not performed in light of patient's recent completion of treatment.  IMPRESSION AND PLAN:  Clinically stable status post intensity- modulation/image-guided radiation therapy treatment for early stage prostate cancer.  I have recommended the patient follow up with Dr. Annabell Howells in 2 months for exam and PSA blood test.  Routine followup in Radiation Oncology in 6 months.    ______________________________ Billie Lade, Ph.D., M.D. JDK/MEDQ  D:  11/13/2011  T:  11/13/2011  Job:  [id number]

## 2011-11-13 NOTE — Progress Notes (Signed)
Here for routine follow up post completion of radiation to pelvis for prostate ca. Completed treatment October 13, 2011.Having occasional hemorrhoid discomfort and bleeding. Denies burning on urination. Sometimes has some hesitation and pressure on initiating stream. A little down today.Mother passed this past Saturday.

## 2011-11-15 ENCOUNTER — Encounter: Payer: Self-pay | Admitting: *Deleted

## 2011-11-30 NOTE — Progress Notes (Signed)
PATIENT CALLED TO SAY THAT HE WAS HAVING PAIN IN HIS HANDS AND PRETTY MUCH ALL OVER.  HE SAID [name] SOMETIMES IT WAS THROBBING AND SOMETIMES IT WAS LIKE SHARPE PAINS.  HE WAS TREATED FOR PROSTATE WHICH WAS EARLIE DISEASE PER DR. KINARD.  I SPOKE WITH DR. KINARD TO LET HIM KNOW WHAT WAS GOING ON WITH THIS PT.  HE SAID THAT HE DIDN'T FEEL THAT IT WAS FORM HIS PROSTATE CANCER BECAUSE IT WAS EARLY DISEASE.  HE SAID THAT THE PT NEEDED TO GO TO HIS PCP FOR THIS.  I CALLED HIM BACK AND RELAYED THIS TO HIM AND HE ASKED ME IF I THOUGHT IT WOULD BE OK TO TAKE ALEVE, I TOLD HIM THAT IT SHOULD HELP HIM, TO TRY THIS.  HE WAS GOOD WITH THIS AND THANKED ME.

## 2012-01-30 ENCOUNTER — Emergency Department (HOSPITAL_COMMUNITY)
Admission: EM | Admit: 2012-01-30 | Discharge: 2012-01-31 | Disposition: A | Discharge status: Home / Self Care | Payer: Self-pay | Attending: Emergency Medicine | Admitting: Emergency Medicine

## 2012-01-30 ENCOUNTER — Encounter (HOSPITAL_COMMUNITY): Payer: Self-pay | Admitting: Emergency Medicine

## 2012-01-30 DIAGNOSIS — R600 Localized edema: Secondary | ICD-10-CM

## 2012-01-30 DIAGNOSIS — R609 Edema, unspecified: Secondary | ICD-10-CM | POA: Insufficient documentation

## 2012-01-30 DIAGNOSIS — Z8546 Personal history of malignant neoplasm of prostate: Secondary | ICD-10-CM | POA: Insufficient documentation

## 2012-01-30 DIAGNOSIS — Z923 Personal history of irradiation: Secondary | ICD-10-CM | POA: Insufficient documentation

## 2012-01-30 DIAGNOSIS — M7989 Other specified soft tissue disorders: Secondary | ICD-10-CM | POA: Insufficient documentation

## 2012-01-30 DIAGNOSIS — I10 Essential (primary) hypertension: Secondary | ICD-10-CM | POA: Insufficient documentation

## 2012-01-30 HISTORY — DX: Malignant (primary) neoplasm, unspecified: C80.1

## 2012-01-30 NOTE — ED Notes (Signed)
Patient c/o bilateral foot and ankle swelling.  States has had this before, but not this bad.

## 2012-01-31 LAB — BASIC METABOLIC PANEL
BUN: 11 mg/dL (ref 6–23)
CO2: 27 mEq/L (ref 19–32)
Calcium: 9.2 mg/dL (ref 8.4–10.5)
Chloride: 105 mEq/L (ref 96–112)
Creatinine, Ser: 0.98 mg/dL (ref 0.50–1.35)
GFR calc Af Amer: >90 mL/min (ref >90)
GFR calc non Af Amer: 88 mL/min — ABNORMAL LOW (ref >90)
Glucose, Bld: 104 mg/dL — ABNORMAL HIGH (ref 70–99)
Potassium: 3.5 mEq/L (ref 3.5–5.1)
Sodium: 140 mEq/L (ref 135–145)

## 2012-01-31 MED ORDER — FUROSEMIDE 40 MG PO TABS
20.0000 mg | ORAL_TABLET | Freq: Once | ORAL | Status: AC
  Administered 2012-01-31: 20 mg via ORAL
  Filled 2012-01-31: qty 1

## 2012-01-31 MED ORDER — HYDROCHLOROTHIAZIDE 25 MG PO TABS: 25.0000 mg | ORAL_TABLET | Freq: Every day | ORAL | Status: DC

## 2012-01-31 MED ORDER — POTASSIUM CHLORIDE CRYS ER 20 MEQ PO TBCR: 20.0000 meq | EXTENDED_RELEASE_TABLET | Freq: Every day | ORAL | Status: DC

## 2012-01-31 NOTE — ED Provider Notes (Signed)
History     CSN: [id number]  Arrival date & time 01/30/12  2308   First MD Initiated Contact with Patient 01/31/12 0001      Chief Complaint  Patient presents with  . Foot Swelling  . Joint Swelling    (Consider location/radiation/quality/duration/timing/severity/associated sxs/prior treatment) HPI  Steven [name] is a 61 y.o. male with prostate cancer s/p radiation therapy  who presents to the Emergency Department complaining of bilateral foot and ankle edema that began two days ago and has become worse. He has a new job requiring he stand for 10 hours a day. He has had lower extremity edema in the past and was on HCTZ. He developed hypokalemia and his urologist took him off the diuretic 6 months ago.   [facility] Dr. Annabell Howells Oncologist [name]   Past Medical History  Diagnosis Date  . Hypertension   . Arthritis   . Cancer     History reviewed. No pertinent past surgical history.  No family history on file.  History  Substance Use Topics  . Smoking status: Former Smoker -- 1.0 packs/day for 20 years    Types: Cigarettes    Quit date: 07/13/2011  . Smokeless tobacco: Not on file  . Alcohol Use: Yes     rarely      Review of Systems  Constitutional: Negative for fever.       10 Systems reviewed and are negative for acute change except as noted in the HPI.  HENT: Negative for congestion.   Eyes: Negative for discharge and redness.  Respiratory: Negative for cough and shortness of breath.   Cardiovascular: Negative for chest pain.  Gastrointestinal: Negative for vomiting and abdominal pain.  Musculoskeletal: Negative for back pain.       Swelling of feet and ankles  Skin: Negative for rash.  Neurological: Negative for syncope, numbness and headaches.  Psychiatric/Behavioral:       No behavior change.    Allergies  Review of patient's allergies indicates no known allergies.  Home Medications   Current Outpatient Rx  Name Route Sig  Dispense Refill  . AMLODIPINE BESYLATE 10 MG PO TABS Oral Take 10 mg by mouth daily.    . ASPIRIN 81 MG PO TABS Oral Take 81 mg by mouth daily.    Marland Kitchen HYDROCORTISONE ACETATE 25 MG RE SUPP Rectal Place 25 mg rectally 2 (two) times daily. 1 supp per rectum bid x 5 days/2 refills qty 10    . HYDROCHLOROTHIAZIDE 25 MG PO TABS Oral Take 1 tablet (25 mg total) by mouth daily. 30 tablet 3  . POTASSIUM CHLORIDE CRYS ER 20 MEQ PO TBCR Oral Take 1 tablet (20 mEq total) by mouth daily. 20 tablet 0    BP 142/77  Pulse 60  Temp 98.2 F (36.8 C) (Oral)  Resp 16  Ht 5\' 6"  (1.676 m)  Wt 210 lb (95.255 kg)  BMI 33.89 kg/m2  SpO2 98%  Physical Exam  ED Course  Procedures (including critical care time) Results for orders placed during the hospital encounter of 01/30/12  BASIC METABOLIC PANEL      Component Value Range   Sodium 140  135 - 145 mEq/L   Potassium 3.5  3.5 - 5.1 mEq/L   Chloride 105  96 - 112 mEq/L   CO2 27  19 - 32 mEq/L   Glucose, Bld 104 (*) 70 - 99 mg/dL   BUN 11  6 - 23 mg/dL   Creatinine, Ser 8.29  0.50 - 1.35 mg/dL   Calcium 9.2  8.4 - 16.1 mg/dL   GFR calc non Af Amer 88 (*) >90 mL/min   GFR calc Af Amer >90  >90 mL/min      1. Edema extremities       MDM  Patient with h/o prostate cancer s/p radiation therapy here with bilateral foot and ankle edema. Given Lasix while here and Rx for HCTX and potassium. He is to follow up with PCP.Pt stable in ED with no significant deterioration in condition.The patient appears reasonably screened and/or stabilized for discharge and I doubt any other medical condition or other [facility] requiring further screening, evaluation, or treatment in the ED at this time prior to discharge.  MDM Reviewed: nursing note and vitals Interpretation: labs           Nicoletta Dress. Colon Branch, MD 01/31/12 [phone]

## 2012-02-23 ENCOUNTER — Encounter (HOSPITAL_COMMUNITY): Payer: Self-pay

## 2012-02-23 ENCOUNTER — Emergency Department (INDEPENDENT_AMBULATORY_CARE_PROVIDER_SITE_OTHER)
Admission: EM | Admit: 2012-02-23 | Discharge: 2012-02-23 | Disposition: A | Discharge status: Home / Self Care | Payer: Self-pay | Source: Home / Self Care | Attending: Emergency Medicine | Admitting: Emergency Medicine

## 2012-02-23 DIAGNOSIS — J029 Acute pharyngitis, unspecified: Secondary | ICD-10-CM

## 2012-02-23 LAB — POCT RAPID STREP A: Streptococcus, Group A Screen (Direct): NEGATIVE

## 2012-02-23 MED ORDER — FAMOTIDINE 20 MG PO TABS: 20.0000 mg | ORAL_TABLET | Freq: Two times a day (BID) | ORAL | Status: DC

## 2012-02-23 MED ORDER — PANTOPRAZOLE SODIUM 40 MG PO TBEC: 40.0000 mg | DELAYED_RELEASE_TABLET | Freq: Every day | ORAL | Status: DC

## 2012-02-23 MED ORDER — SUCRALFATE 1 GM/10ML PO SUSP: 1.0000 g | Freq: Four times a day (QID) | ORAL | Status: DC

## 2012-02-23 NOTE — ED Provider Notes (Signed)
History     CSN: [id number]  Arrival date & time 02/23/12  2440   First MD Initiated Contact with Patient 02/23/12 1919      Chief Complaint  Patient presents with  . Sore Throat    (Consider location/radiation/quality/duration/timing/severity/associated sxs/prior treatment) HPI Comments: Patient reports increased belching, sore throat which is worse at night when he lies down and in the morning for the past week. Has a cough at night. No wheezing, chest pain, shortness of breath.. Reports mild fatigue. No nausea, vomiting, fevers, drooling, trismus voice changes, sensation of throat swelling shut.. No abdominal pain, waterbrash. No known sick contacts. He has been taking Alka-Seltzer with significant relief. He is not a smoker.   ROS as noted in HPI. All other ROS negative.   Patient is a 61 y.o. male presenting with pharyngitis. The history is provided by the patient. No language interpreter was used.  Sore Throat This is a new problem. The current episode started more than 1 week ago. The problem occurs daily. The problem has not changed since onset.Pertinent negatives include no chest pain, no abdominal pain and no shortness of breath. Exacerbated by: llying down. Relieved by: alkaseltzer. He has tried ASA for the symptoms. The treatment provided moderate relief.    Past Medical History  Diagnosis Date  . Hypertension   . Arthritis   . Cancer     prostate s/p radiation Mar 2013    Past Surgical History  Procedure Date  . Kidney surgery     History reviewed. No pertinent family history.  History  Substance Use Topics  . Smoking status: Former Smoker -- 1.0 packs/day for 20 years    Types: Cigarettes    Quit date: 07/13/2011  . Smokeless tobacco: Not on file  . Alcohol Use: No     rarely      Review of Systems  Respiratory: Negative for shortness of breath.   Cardiovascular: Negative for chest pain.  Gastrointestinal: Negative for abdominal pain.     Allergies  Review of patient's allergies indicates no known allergies.  Home Medications   Current Outpatient Rx  Name Route Sig Dispense Refill  . AMLODIPINE BESYLATE 10 MG PO TABS Oral Take 10 mg by mouth daily.    . ASPIRIN 81 MG PO TABS Oral Take 81 mg by mouth daily.    . ASPIRIN EFFERVESCENT 325 MG PO TBEF Oral Take 325 mg by mouth every 6 (six) hours as needed.    Marland Kitchen HYDROCHLOROTHIAZIDE 25 MG PO TABS Oral Take 1 tablet (25 mg total) by mouth daily. 30 tablet 3  . POTASSIUM CHLORIDE CRYS ER 20 MEQ PO TBCR Oral Take 1 tablet (20 mEq total) by mouth daily. 20 tablet 0  . FAMOTIDINE 20 MG PO TABS Oral Take 1 tablet (20 mg total) by mouth 2 (two) times daily. 40 tablet 0  . HYDROCORTISONE ACETATE 25 MG RE SUPP Rectal Place 25 mg rectally 2 (two) times daily. 1 supp per rectum bid x 5 days/2 refills qty 10    . PANTOPRAZOLE SODIUM 40 MG PO TBEC Oral Take 1 tablet (40 mg total) by mouth daily. 20 tablet 0  . SUCRALFATE 1 GM/10ML PO SUSP Oral Take 10 mLs (1 g total) by mouth 4 (four) times daily. 10 mL before meals and before bedtime 240 mL 0    BP 140/73  Pulse 70  Temp 98.7 F (37.1 C) (Oral)  Resp 20  SpO2 100%  Physical Exam  Nursing note and vitals  reviewed. Constitutional: He is oriented to person, place, and time. He appears well-developed and well-nourished.  HENT:  Head: Normocephalic and atraumatic. No trismus in the jaw.  Right Ear: Tympanic membrane normal.  Left Ear: Tympanic membrane normal.  Nose: Nose normal. Right sinus exhibits no maxillary sinus tenderness and no frontal sinus tenderness. Left sinus exhibits no maxillary sinus tenderness and no frontal sinus tenderness.  Mouth/Throat: Uvula is midline and oropharynx is clear and moist. No uvula swelling. No oropharyngeal exudate.       Erythematous oropharynx. Tonsils normal.  Eyes: Conjunctivae and EOM are normal.  Neck: Normal range of motion.  Cardiovascular: Normal rate, regular rhythm and normal heart  sounds.   Pulmonary/Chest: Effort normal and breath sounds normal. No respiratory distress.  Abdominal: He exhibits no distension.  Musculoskeletal: Normal range of motion.  Lymphadenopathy:    He has no cervical adenopathy.  Neurological: He is alert and oriented to person, place, and time. Coordination normal.  Skin: Skin is warm and dry.  Psychiatric: He has a normal mood and affect. His behavior is normal. Judgment and thought content normal.    ED Course  Procedures (including critical care time)   Labs Reviewed  POCT RAPID STREP A ([facility] ONLY)   No results found.   1. Reflux pharyngitis     Results for orders placed during the hospital encounter of 02/23/12  POCT RAPID STREP A ([facility] ONLY)      Component Value Range   Streptococcus, Group A Screen (Direct) NEGATIVE  NEGATIVE     MDM  H&P most consistent with reflux pharyngitis. Will start on Carafate, protonix, Pepcid. Discussed signs and symptoms that should prompt his return. Patient agrees with plan.  Luiz Blare, MD 02/23/12 2152

## 2012-02-23 NOTE — ED Notes (Signed)
C/o sore throat, sneezing, cough (especially at night) for 1 week.  Denies fever

## 2012-05-10 ENCOUNTER — Encounter: Payer: Self-pay | Admitting: Radiation Oncology

## 2012-05-13 ENCOUNTER — Ambulatory Visit
Admission: RE | Admit: 2012-05-13 | Discharge: 2012-05-13 | Disposition: A | Discharge status: Home / Self Care | Payer: Self-pay | Source: Ambulatory Visit | Attending: Radiation Oncology | Admitting: Radiation Oncology

## 2012-05-13 ENCOUNTER — Encounter: Payer: Self-pay | Admitting: Radiation Oncology

## 2012-05-13 VITALS — BP 135/83 | HR 72 | Temp 97.5°F | Wt 206.1 lb

## 2012-05-13 DIAGNOSIS — C61 Malignant neoplasm of prostate: Secondary | ICD-10-CM

## 2012-05-13 NOTE — Progress Notes (Signed)
  Radiation Oncology         [phone] ________________________________  Name: Steven [name] MRN: [id number]  Date: 05/13/2012  DOB: 08-Sep-1950  Follow-Up Visit Note  CC: [name], NP  Anner Crete, MD  Diagnosis:   Prostate cancer  Interval Since Last Radiation:  7 months  Narrative:  The patient returns today for routine follow-up.  He seems to be be doing well at this time he continues to work his usual schedule. Patient denies any hematuria dysuria or rectal bleeding. He has nocturia x1.  According to the patient his PSA is down to approximately 2.                              ALLERGIES:   has no known allergies.  Meds: Current Outpatient Prescriptions  Medication Sig Dispense Refill  . amLODipine (NORVASC) 10 MG tablet Take 10 mg by mouth daily.      Marland Kitchen aspirin 81 MG tablet Take 81 mg by mouth daily.      . hydrochlorothiazide (HYDRODIURIL) 25 MG tablet Take 1 tablet (25 mg total) by mouth daily.  30 tablet  3  . sucralfate (CARAFATE) 1 GM/10ML suspension Take 10 mLs (1 g total) by mouth 4 (four) times daily. 10 mL before meals and before bedtime  240 mL  0    Physical Findings: The patient is in no acute distress. Patient is alert and oriented.  weight is 206 lb 1.6 oz (93.486 kg). His temperature is 97.5 F (36.4 C). His blood pressure is 135/83 and his pulse is 72. Marland Kitchen  No palpable cervical supraclavicular or axillary adenopathy. The lungs are clear to auscultation. The heart has a regular rhythm and rate. a prostate exam as not performed in light of upcoming exam by Dr. Annabell Howells.  Lab Findings: Lab Results  Component Value Date   HGB 14.7 10/10/2010   HCT 42.8 10/10/2010    @LASTCHEM @  Radiographic Findings: No results found.  Impression:  The patient is recovering from the effects of radiation.  Per reports his PSA is dropping nicely.  Plan:  When necessary followup. The patient will continue close followup with Dr.  Annabell Howells.  _____________________________________   Billie Lade, PhD, MD

## 2012-05-13 NOTE — Progress Notes (Signed)
Patient here completion of radiation October 13, 2011.Denies urinary burning.Has nocturia up to 1 times nightly.

## 2012-05-27 ENCOUNTER — Encounter: Payer: Self-pay | Admitting: Cardiovascular Disease

## 2012-05-29 ENCOUNTER — Encounter: Payer: Self-pay | Admitting: Cardiology

## 2012-05-30 ENCOUNTER — Encounter: Payer: Self-pay | Admitting: *Deleted

## 2012-05-30 ENCOUNTER — Encounter: Payer: Self-pay | Admitting: Cardiovascular Disease

## 2012-05-30 ENCOUNTER — Ambulatory Visit (INDEPENDENT_AMBULATORY_CARE_PROVIDER_SITE_OTHER): Payer: Self-pay | Admitting: Cardiovascular Disease

## 2012-05-30 VITALS — BP 120/70 | HR 72 | Ht 66.0 in | Wt 206.0 lb

## 2012-05-30 DIAGNOSIS — R002 Palpitations: Secondary | ICD-10-CM

## 2012-05-30 DIAGNOSIS — C61 Malignant neoplasm of prostate: Secondary | ICD-10-CM

## 2012-05-30 DIAGNOSIS — E785 Hyperlipidemia, unspecified: Secondary | ICD-10-CM | POA: Insufficient documentation

## 2012-05-30 DIAGNOSIS — R0989 Other specified symptoms and signs involving the circulatory and respiratory systems: Secondary | ICD-10-CM

## 2012-05-30 DIAGNOSIS — E669 Obesity, unspecified: Secondary | ICD-10-CM

## 2012-05-30 DIAGNOSIS — R0600 Dyspnea, unspecified: Secondary | ICD-10-CM

## 2012-05-30 DIAGNOSIS — R079 Chest pain, unspecified: Secondary | ICD-10-CM | POA: Insufficient documentation

## 2012-05-30 NOTE — Assessment & Plan Note (Signed)
Recently started on statin F/U labs 3 months [facility]

## 2012-05-30 NOTE — Assessment & Plan Note (Signed)
Refer to nutritionist for low carb diet.  Poor insight  Discussed meal portions, frequency and carb content

## 2012-05-30 NOTE — Assessment & Plan Note (Signed)
F/U Dr Annabell Howells  PSA down.  Symptoms improved

## 2012-05-30 NOTE — Assessment & Plan Note (Signed)
Seems functional echo as part of stress testing

## 2012-05-30 NOTE — Patient Instructions (Signed)
Your physician recommends that you schedule a follow-up appointment in: As needed   Your physician has requested that you have a stress echocardiogram. For further information please visit [email or web address]. Please follow instruction sheet as given.    

## 2012-05-30 NOTE — Assessment & Plan Note (Signed)
Atypical given dyspnea will f/u with stress echo

## 2012-05-30 NOTE — Progress Notes (Signed)
Patient ID: Steven [name], male   DOB: [birth year], 61 y.o.   MRN: [id number] 61 yo [facility]  Has HTN elevated lipids.  "Hot Flash" getting OOB in ana.m.  Recently put on doxycycline by Dr Margo Aye for skin issues.  Has "fluttering" in chest on occasion. Lasts minutes NO ppt factors no syncope.  Has some exertional dyspnea.  SSCP intermit last few months. Thought it is related to GERD and helped by antacid.  Some stress this past year with prostate CA.  Had seed implants and PSA down to 2 . F/U with Dr Annabell Howells December.  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  Exertional dyspnea.  Discussed low carb diet for obesity and he needs to see a nutritionist as he has poor insight.  Quit smoking a year ago  Reviewed labs  LDL 137 normal LFTls done 84/13/24  ROS: Denies fever, malais, weight loss, blurry vision, decreased visual acuity, cough, sputum, SOB, hemoptysis, pleuritic pain, palpitaitons, heartburn, abdominal pain, melena, lower extremity edema, claudication, or rash.  All other systems reviewed and negative   General: Affect appropriate Overweight black male HEENT: normal Neck supple with no adenopathy JVP normal no bruits no thyromegaly Lungs clear with no wheezing and good diaphragmatic motion Heart:  S1/S2 no murmur,rub, gallop or click PMI normal Abdomen: benighn, BS positve, no tenderness, no AAA no bruit.  No HSM or HJR Distal pulses intact with no bruits No edema Neuro non-focal Skin warm and dry No muscular weakness  Medications Current Outpatient Prescriptions  Medication Sig Dispense Refill  . amLODipine (NORVASC) 10 MG tablet Take 10 mg by mouth daily.      Marland Kitchen aspirin 81 MG tablet Take 81 mg by mouth daily.      . fish oil-omega-3 fatty acids 1000 MG capsule Take 2 g by mouth daily.      . metoprolol tartrate (LOPRESSOR) 25 MG tablet Take 25 mg by mouth 2 (two) times daily.      . minocycline (DYNACIN) 100 MG tablet Take 100 mg by mouth 2 (two) times daily.      .  pravastatin (PRAVACHOL) 20 MG tablet Take 20 mg by mouth daily.      . tadalafil (CIALIS) 10 MG tablet Take 10 mg by mouth daily as needed.        Allergies Review of patient's allergies indicates no known allergies.  Family History: No family history on file.  Social History: History   Social History  . Marital Status: Single    Spouse Name: N/A    Number of Children: N/A  . 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 Smoking status: Former Smoker -- 1.0 packs/day for 20 years    Types: Cigarettes    Quit date: 07/13/2011  . Smokeless tobacco: Not on file  . Alcohol Use: No     Comment: rarely  . Drug Use: No  . Sexually Active: Not on file   Other Topics Concern  . Not on file   Social History Narrative   Divorced    Electrocardiogram:  05/14/12  NSR rate 64 T wave inversion lead 3 otherwise normal  Assessment and Plan

## 2012-06-05 ENCOUNTER — Ambulatory Visit (HOSPITAL_COMMUNITY)
Admission: RE | Admit: 2012-06-05 | Discharge: 2012-06-05 | Disposition: A | Discharge status: Home / Self Care | Payer: Self-pay | Source: Ambulatory Visit | Attending: Cardiovascular Disease | Admitting: Cardiovascular Disease

## 2012-06-05 DIAGNOSIS — R002 Palpitations: Secondary | ICD-10-CM

## 2012-06-05 DIAGNOSIS — E785 Hyperlipidemia, unspecified: Secondary | ICD-10-CM

## 2012-06-05 DIAGNOSIS — R079 Chest pain, unspecified: Secondary | ICD-10-CM

## 2012-06-05 NOTE — Progress Notes (Signed)
Stress Lab Nurses Notes - Steven [name] 06/05/2012 Reason for doing test: Chest Pain and Dyspnea Type of test: Stress Echo Nurse performing test: Parke Poisson, RN Nuclear Medicine Tech: Not Applicable Echo Tech: Karrie Doffing MD performing test: R. Rothbart & Joni Reining NP Family MD: [name] NP Test explained and consent signed: yes IV started: No IV started Symptoms: Fatigue in legs & SOB Treatment/Intervention: None Reason test stopped: fatigue After recovery IV was: NA Patient to return to Nuc. Med at :NA Patient discharged: Home Patient's Condition upon discharge was: stable Comments: Unable to reach THR while on TM. Took Lopressor this AM. Unable to do ECHO.  During test peak BP 163/80 & HR 118.  Recovery BP 126/55 & HR 72.  Symptoms resolved in recovery. Steven [name]

## 2012-06-07 NOTE — CV Procedure (Signed)
Graded Exercise Test  Steven [name]  06/05/2012  Reason for doing test: Chest Pain and Dyspnea  Type of test: Stress Echo - echocardiographic portion not completed Nurse performing test: Steven Poisson, RN  Nuclear Medicine Tech: Not Applicable  Echo Tech: Steven Doffing  MD performing test: Steven [name] & Steven Reining NP  Family MD: [name] NP  Test explained and consent signed: yes  IV started: No IV started  Symptoms: Fatigue in legs & SOB  Treatment/Intervention: None  Reason test stopped: fatigue  After recovery IV was: NA  Patient to return to Nuc. Med at :NA  Patient discharged: Home  Patient's Condition upon discharge was: stable  Comments: Unable to reach THR while on TM. Took Lopressor this AM. Unable to do ECHO. During test peak BP 163/80 & HR 118. Recovery BP 126/55 & HR 72. Symptoms resolved in recovery.   Erskine Speed T  Graded Exercise Test-Interpretation      Graded exercise performed to a work load of 10.1 METs and a heart rate of 125, 78 % of age-predicted maximum.  Exercise discontinued due to dyspnea and fatigue; no chest discomfort reported.  Blood pressure increased from a resting value of 120/75 to 165/80 at peak exercise, a normal response.  No arrhythmias noted.  EKG: Sinus bradycardia at a rate of 54 bpm; minor J-point elevation; delayed R-wave progression Stress EKG:  Interpretation impaired by artifact. At peak exercise, there was insignificant upsloping ST segment depression; however, earlier in the test, computer average tracings suggested that borderline significant flat ST segment depression might be present. This likely is an artifact. Impression:  Probably negative submaximal graded exercise test.  Other findings as noted.   AFB Bing, M.D.

## 2012-06-11 ENCOUNTER — Telehealth: Payer: Self-pay | Admitting: *Deleted

## 2012-06-11 DIAGNOSIS — R079 Chest pain, unspecified: Secondary | ICD-10-CM

## 2012-06-11 DIAGNOSIS — R0600 Dyspnea, unspecified: Secondary | ICD-10-CM

## 2012-06-11 NOTE — Telephone Encounter (Signed)
..  left message to have patient return my call per MD RR gave verbal instruction that pt will need to have another STRESS ECHO performed per noted at 06-05-12 S.E. Testing the portion for SE was not performed as requested unfortunately  Called to advise apt date/time/instructions as follows:  Patient is rescheduled for 06/18/12. Needs to register @ 0930 @ Main Entrance of hospital. Please advise patient to not take his BP meds(Metoprolol ) that morning prior to test.

## 2012-06-12 ENCOUNTER — Encounter: Payer: Self-pay | Admitting: *Deleted

## 2012-06-12 NOTE — Telephone Encounter (Signed)
Mailed letter to pt address noted in chart with instructions to Stress Echo, advised to HOLD Metoprolol the morning of the test

## 2012-06-12 NOTE — Telephone Encounter (Signed)
.  left message to have patient return my call. Placed orders in chart for Stress Echo

## 2012-06-17 NOTE — Telephone Encounter (Signed)
Left detailed messages for pt on both mobile and home numbers as well as mailed pt instructions for testing scheduled tomorrow 06-18-12 at 10:30am, advised on vm today that it is imperative that the contact our office to clarify that he is aware of his test and if he is planning to attend.

## 2012-06-18 ENCOUNTER — Encounter (HOSPITAL_COMMUNITY): Payer: Self-pay | Admitting: Cardiology

## 2012-06-18 ENCOUNTER — Ambulatory Visit (HOSPITAL_COMMUNITY)
Admission: RE | Admit: 2012-06-18 | Discharge: 2012-06-18 | Disposition: A | Discharge status: Home / Self Care | Payer: Self-pay | Source: Ambulatory Visit | Attending: Cardiovascular Disease | Admitting: Cardiovascular Disease

## 2012-06-18 DIAGNOSIS — Z87891 Personal history of nicotine dependence: Secondary | ICD-10-CM | POA: Insufficient documentation

## 2012-06-18 DIAGNOSIS — R079 Chest pain, unspecified: Secondary | ICD-10-CM

## 2012-06-18 DIAGNOSIS — R072 Precordial pain: Secondary | ICD-10-CM | POA: Insufficient documentation

## 2012-06-18 DIAGNOSIS — R002 Palpitations: Secondary | ICD-10-CM

## 2012-06-18 NOTE — Progress Notes (Signed)
*  PRELIMINARY RESULTS* Echocardiogram Echocardiogram Stress Test has been performed.  Steven [name] 06/18/2012, 10:34 AM

## 2012-06-18 NOTE — Progress Notes (Signed)
Stress Lab Nurses Notes - Steven [name] 06/18/2012 Reason for doing test: Chest Pain and Palpitation Type of test: Stress Echo Nurse performing test: Parke Poisson, RN Nuclear Medicine Tech: Not Applicable Echo Tech: Karrie Doffing MD performing test: R. Rothbart Family MD: [facility] Test explained and consent signed: yes IV started: No IV started Symptoms: Fatigue in legs Treatment/Intervention: None Reason test stopped: reached target HR After recovery IV was: NA Patient to return to Nuc. Med at :NA Patient discharged: Home Patient's Condition upon discharge was: stable Comments: During test Peak BP 173/70 & HR 144.  Recovery BP 134/49 & HR 75.  Symptoms resolved in recovery. Steven [name]

## 2012-06-18 NOTE — Telephone Encounter (Signed)
Tried to call pt home phone and voicemail noted the mailbox was full and can not accept any messages at this time. Contacted Karena Addison in [employer] and was advised the pt did come in for the Echo this morning. Advised that the MD will result as the final report comes through and the pt will be notified

## 2012-07-05 ENCOUNTER — Ambulatory Visit (INDEPENDENT_AMBULATORY_CARE_PROVIDER_SITE_OTHER): Payer: Self-pay | Admitting: Urology

## 2012-07-05 DIAGNOSIS — C61 Malignant neoplasm of prostate: Secondary | ICD-10-CM

## 2012-08-03 ENCOUNTER — Telehealth (HOSPITAL_COMMUNITY): Payer: Self-pay | Admitting: Family Medicine

## 2012-08-03 ENCOUNTER — Encounter (HOSPITAL_COMMUNITY): Payer: Self-pay | Admitting: Emergency Medicine

## 2012-08-03 ENCOUNTER — Emergency Department (INDEPENDENT_AMBULATORY_CARE_PROVIDER_SITE_OTHER)
Admission: EM | Admit: 2012-08-03 | Discharge: 2012-08-03 | Disposition: A | Discharge status: Home / Self Care | Payer: Self-pay | Source: Home / Self Care | Attending: Family Medicine | Admitting: Family Medicine

## 2012-08-03 ENCOUNTER — Emergency Department (INDEPENDENT_AMBULATORY_CARE_PROVIDER_SITE_OTHER): Payer: Self-pay

## 2012-08-03 DIAGNOSIS — M545 Low back pain, unspecified: Secondary | ICD-10-CM

## 2012-08-03 DIAGNOSIS — M5380 Other specified dorsopathies, site unspecified: Secondary | ICD-10-CM

## 2012-08-03 DIAGNOSIS — G8929 Other chronic pain: Secondary | ICD-10-CM

## 2012-08-03 DIAGNOSIS — M62830 Muscle spasm of back: Secondary | ICD-10-CM

## 2012-08-03 LAB — POCT URINALYSIS DIP (DEVICE)
Bilirubin Urine: NEGATIVE
Glucose, UA: NEGATIVE mg/dL
Hgb urine dipstick: NEGATIVE
Ketones, ur: NEGATIVE mg/dL
Leukocytes, UA: NEGATIVE
Nitrite: NEGATIVE
Protein, ur: NEGATIVE mg/dL
Specific Gravity, Urine: 1.02 (ref 1.005–1.030)
Urobilinogen, UA: 1 mg/dL (ref 0.0–1.0)
pH: 7 (ref 5.0–8.0)

## 2012-08-03 MED ORDER — CELECOXIB 100 MG PO CAPS: 100.0000 mg | ORAL_CAPSULE | Freq: Two times a day (BID) | ORAL | Status: DC

## 2012-08-03 MED ORDER — CYCLOBENZAPRINE HCL 10 MG PO TABS: 10.0000 mg | ORAL_TABLET | Freq: Two times a day (BID) | ORAL | Status: DC | PRN

## 2012-08-03 MED ORDER — TRAMADOL HCL 50 MG PO TABS: 50.0000 mg | ORAL_TABLET | Freq: Four times a day (QID) | ORAL | Status: DC | PRN

## 2012-08-03 NOTE — ED Notes (Signed)
Arlys John from [facility] called stating that the pt could not afford the celebrex that cost $90... Per Dr. Alfonse Ras, switched to Nabumetone 500mg  BID x10 days; 20 to be dispensed w/no refills.

## 2012-08-03 NOTE — ED Provider Notes (Signed)
History     CSN: [id number]  Arrival date & time 08/03/12  1108   First MD Initiated Contact with Patient 08/03/12 1119      Chief Complaint  Patient presents with  . Back Pain    (Consider location/radiation/quality/duration/timing/severity/associated sxs/prior treatment) HPI Comments: 62 year old male with history of hypertension, prostate cancer (diagnosed and treated with radiation on 2012) among other comorbidities. Here complaining of recurrent low back pain for about a year. Reports that he feels his back stiff. Patient reports that he's been told he has scoliosis. Pain worse if heavy lifting, sitting or laying flat for long periods. States his pain gets better with walking and movement. Denies dysuria or hematuria. Denies chills or fever. Denies radiation to his legs. Patient for has not taken any medication for his symptoms and is being having exacerbation of his low back pain for the last 2-3 days.   Past Medical History  Diagnosis Date  . Hypertension   . Arthritis   . Cancer     prostate s/p radiation Mar 2013  . History of radiation therapy 08/22/11-10/13/11    prostate  . Chest pain   . Hyperlipemia     Past Surgical History  Procedure Date  . Kidney surgery     No family history on file.  History  Substance Use Topics  . Smoking status: Former Smoker -- 1.0 packs/day for 20 years    Types: Cigarettes    Quit date: 07/13/2011  . Smokeless tobacco: Not on file  . Alcohol Use: No     Comment: rarely      Review of Systems  Constitutional: Negative for fever, chills, fatigue and unexpected weight change.  Gastrointestinal: Negative for abdominal pain.  Genitourinary: Negative for dysuria and frequency.  Musculoskeletal: Positive for back pain.  Skin: Negative for rash.  Neurological: Negative for weakness and numbness.    Allergies  Review of patient's allergies indicates no known allergies.  Home Medications   Current Outpatient Rx  Name   Route  Sig  Dispense  Refill  . AMLODIPINE BESYLATE 10 MG PO TABS   Oral   Take 10 mg by mouth daily.         . ASPIRIN 81 MG PO TABS   Oral   Take 81 mg by mouth daily.         Marland Kitchen HYDROCHLOROTHIAZIDE 25 MG PO TABS   Oral   Take 25 mg by mouth daily.         . CELECOXIB 100 MG PO CAPS   Oral   Take 1 capsule (100 mg total) by mouth 2 (two) times daily with a meal. As needed   20 capsule   0   . CYCLOBENZAPRINE HCL 10 MG PO TABS   Oral   Take 1 tablet (10 mg total) by mouth 2 (two) times daily as needed for muscle spasms.   20 tablet   0   . OMEGA-3 FATTY ACIDS 1000 MG PO CAPS   Oral   Take 2 g by mouth daily.         Marland Kitchen METOPROLOL TARTRATE 25 MG PO TABS   Oral   Take 25 mg by mouth 2 (two) times daily.         Marland Kitchen MINOCYCLINE HCL 100 MG PO TABS   Oral   Take 100 mg by mouth 2 (two) times daily.         Marland Kitchen PRAVASTATIN SODIUM 20 MG PO TABS   Oral  Take 20 mg by mouth daily.         Marland Kitchen TADALAFIL 10 MG PO TABS   Oral   Take 10 mg by mouth daily as needed.         Marland Kitchen TRAMADOL HCL 50 MG PO TABS   Oral   Take 1 tablet (50 mg total) by mouth every 6 (six) hours as needed for pain.   15 tablet   0     BP 127/80  Pulse 71  Temp 98.4 F (36.9 C) (Oral)  Resp 18  SpO2 98%  Physical Exam  Nursing note and vitals reviewed. Constitutional: He is oriented to person, place, and time. He appears well-developed and well-nourished. No distress.  HENT:  Head: Normocephalic and atraumatic.  Cardiovascular: Normal heart sounds.   Pulmonary/Chest: Breath sounds normal.  Abdominal: Soft. There is no tenderness.       No CVT  Musculoskeletal:       Low spine scoliosis towards right side. Fair anterior flexion and posterior extension. Patient able to walk on tip toes and heels with no difficulty foot drop or pain exacerbation. No bone prominence tenderness. Tenderness to palpation in bilateral lumbar paravertebral muscles.  Negative straight leg test bilateral.  Intact sensation and symmetric + DTRs (rotullian and achillean) in low extremities.    Neurological: He is alert and oriented to person, place, and time.  Skin: No rash noted. He is not diaphoretic.    ED Course  Procedures (including critical care time)   Labs Reviewed  POCT URINALYSIS DIP (DEVICE)  CBC   Dg Lumbar Spine Complete  08/03/2012  *RADIOLOGY REPORT*  Clinical Data: Low back pain.  History of prostate cancer.  LUMBAR SPINE - COMPLETE 4+ VIEW  Comparison: None.  Findings: Dextroconvex thoracolumbar scoliosis is present with the apex at T12-L1.  There is no fracture or acute osseous abnormality. There are no pars defects identified.  Vertebral body height is preserved.  Lower thoracic spine DISH.  Intervertebral disc spaces appear preserved.  L4-L5 mild facet arthrosis.  Surgical clips and a present in the anatomic pelvis.  IMPRESSION: No acute osseous abnormality.  Mild lumbar spondylosis and a moderate dextroconvex thoracolumbar scoliosis.   Original Report Authenticated By: Andreas Newport, M.D.      1. Chronic low back pain   2. Back muscle spasm       MDM  X-rays consistent with scoliosis. No punched out or lytic lesions. Physical exam impress of muscle strain/spasms. Prescribed tramadol, Flexeril and Celebrex. Supportive care including rehabilitation exercises and red flags that should prompt his return to medical attention discussed with patient and provided in writing.       Sharin Grave, MD 08/05/12 [phone]

## 2012-08-03 NOTE — ED Notes (Signed)
Pt c/o back stiffness x1 year Doesn't hurt but it becomes stiff after sitting for long periods or laying down Back improves w/activitiy Denies: urinary problems; saw urologist last month and no problems  He is alert w/no signs of acute distress.

## 2012-11-29 ENCOUNTER — Encounter (HOSPITAL_COMMUNITY): Payer: Self-pay | Admitting: Emergency Medicine

## 2012-11-29 ENCOUNTER — Emergency Department (HOSPITAL_COMMUNITY)
Admission: EM | Admit: 2012-11-29 | Discharge: 2012-11-29 | Disposition: A | Discharge status: Home / Self Care | Payer: BC Managed Care – PPO | Attending: Emergency Medicine | Admitting: Emergency Medicine

## 2012-11-29 DIAGNOSIS — Z7982 Long term (current) use of aspirin: Secondary | ICD-10-CM | POA: Insufficient documentation

## 2012-11-29 DIAGNOSIS — Z8546 Personal history of malignant neoplasm of prostate: Secondary | ICD-10-CM | POA: Insufficient documentation

## 2012-11-29 DIAGNOSIS — I10 Essential (primary) hypertension: Secondary | ICD-10-CM | POA: Insufficient documentation

## 2012-11-29 DIAGNOSIS — K625 Hemorrhage of anus and rectum: Secondary | ICD-10-CM | POA: Insufficient documentation

## 2012-11-29 DIAGNOSIS — E785 Hyperlipidemia, unspecified: Secondary | ICD-10-CM | POA: Insufficient documentation

## 2012-11-29 DIAGNOSIS — Z79899 Other long term (current) drug therapy: Secondary | ICD-10-CM | POA: Insufficient documentation

## 2012-11-29 DIAGNOSIS — M129 Arthropathy, unspecified: Secondary | ICD-10-CM | POA: Insufficient documentation

## 2012-11-29 DIAGNOSIS — Z87891 Personal history of nicotine dependence: Secondary | ICD-10-CM | POA: Insufficient documentation

## 2012-11-29 LAB — CBC WITH DIFFERENTIAL/PLATELET
Basophils Absolute: 0 10*3/uL (ref 0.0–0.1)
Basophils Relative: 1 % (ref 0–1)
Eosinophils Absolute: 0.1 10*3/uL (ref 0.0–0.7)
Eosinophils Relative: 3 % (ref 0–5)
HCT: 42.1 % (ref 39.0–52.0)
Hemoglobin: 14.7 g/dL (ref 13.0–17.0)
Lymphocytes Relative: 50 % — ABNORMAL HIGH (ref 12–46)
Lymphs Abs: 2.1 10*3/uL (ref 0.7–4.0)
MCH: 29.6 pg (ref 26.0–34.0)
MCHC: 34.9 g/dL (ref 30.0–36.0)
MCV: 84.9 fL (ref 78.0–100.0)
Monocytes Absolute: 0.4 10*3/uL (ref 0.1–1.0)
Monocytes Relative: 8 % (ref 3–12)
Neutro Abs: 1.7 10*3/uL (ref 1.7–7.7)
Neutrophils Relative %: 39 % — ABNORMAL LOW (ref 43–77)
Platelets: 222 10*3/uL (ref 150–400)
RBC: 4.96 MIL/uL (ref 4.22–5.81)
RDW: 12.9 % (ref 11.5–15.5)
WBC: 4.3 10*3/uL (ref 4.0–10.5)

## 2012-11-29 MED ORDER — HYDROCORTISONE ACETATE 25 MG RE SUPP: 25.0000 mg | Freq: Three times a day (TID) | RECTAL | Status: DC

## 2012-11-29 NOTE — ED Provider Notes (Signed)
History     This chart was scribed for Donnetta Hutching, MD, MD by Jiles Prows, ED Scribe. The patient was seen in room APA04/APA04 and the patient's care was started at 7:45am.   CSN: [id number]  Arrival date & time 11/29/12  4098    Chief Complaint  Patient presents with  . Rectal Bleeding    The history is provided by the patient and medical records. No language interpreter was used.   HPI Comments: Steven [name] is a 62 y.o. male who presents to the Emergency Department complaining of intermittent, mild blood in stool beginning 1 day ago.  The stool was streaked with blood.  Pt has had approximately 4 total mild episodes within past 1.5 weeks. Pt denies fever, chills, nausea, vomiting, diarrhea, weakness, cough, SOB and any other pain. Pt has appointment with urologist Dr. Chauncy Lean in June for prostate cancer checkup.  Pt has been treated with radiation for this cancer.  Past Medical History  Diagnosis Date  . Hypertension   . Arthritis   . Cancer     prostate s/p radiation Mar 2013  . History of radiation therapy 08/22/11-10/13/11    prostate  . Chest pain   . Hyperlipemia     Past Surgical History  Procedure Laterality Date  . Kidney surgery      History reviewed. No pertinent family history.  History  Substance Use Topics  . Smoking status: Former Smoker -- 1.00 packs/day for 20 years    Types: Cigarettes    Quit date: 07/13/2011  . Smokeless tobacco: Not on file  . Alcohol Use: No     Comment: rarely      Review of Systems 10 Systems reviewed and all are negative for acute change except as noted in the HPI.  Allergies  Review of patient's allergies indicates no known allergies.  Home Medications   Current Outpatient Rx  Name  Route  Sig  Dispense  Refill  . amLODipine (NORVASC) 10 MG tablet   Oral   Take 10 mg by mouth daily.         Marland Kitchen aspirin 81 MG tablet   Oral   Take 81 mg by mouth daily.         . fish oil-omega-3 fatty acids 1000 MG capsule    Oral   Take 2 g by mouth daily.         . hydrochlorothiazide (HYDRODIURIL) 25 MG tablet   Oral   Take 25 mg by mouth daily.         . minocycline (DYNACIN) 100 MG tablet   Oral   Take 100 mg by mouth 2 (two) times daily.         . pravastatin (PRAVACHOL) 20 MG tablet   Oral   Take 20 mg by mouth daily.         . tadalafil (CIALIS) 10 MG tablet   Oral   Take 10 mg by mouth daily as needed.         . celecoxib (CELEBREX) 100 MG capsule   Oral   Take 1 capsule (100 mg total) by mouth 2 (two) times daily with a meal. As needed   20 capsule   0   . cyclobenzaprine (FLEXERIL) 10 MG tablet   Oral   Take 1 tablet (10 mg total) by mouth 2 (two) times daily as needed for muscle spasms.   20 tablet   0   . metoprolol tartrate (LOPRESSOR) 25 MG tablet  Oral   Take 25 mg by mouth 2 (two) times daily.         . traMADol (ULTRAM) 50 MG tablet   Oral   Take 1 tablet (50 mg total) by mouth every 6 (six) hours as needed for pain.   15 tablet   0     BP 160/91  Pulse 71  Temp(Src) 97.9 F (36.6 C) (Oral)  Resp 18  Ht 5\' 6"  (1.676 m)  Wt 210 lb (95.255 kg)  BMI 33.91 kg/m2  SpO2 100%  Physical Exam  Nursing note and vitals reviewed. Constitutional: He is oriented to person, place, and time. He appears well-developed and well-nourished.  HENT:  Head: Normocephalic and atraumatic.  Eyes: Conjunctivae and EOM are normal. Pupils are equal, round, and reactive to light.  Neck: Normal range of motion. Neck supple.  Cardiovascular: Normal rate, regular rhythm and normal heart sounds.   Pulmonary/Chest: Effort normal and breath sounds normal.  Abdominal: Soft. Bowel sounds are normal.  Genitourinary:  No masses.  No gross blood.  Hemoccult positive.  Musculoskeletal: Normal range of motion.  Neurological: He is alert and oriented to person, place, and time.  Skin: Skin is warm and dry.  Psychiatric: He has a normal mood and affect.    ED Course  Procedures  (including critical care time) DIAGNOSTIC STUDIES: Oxygen Saturation is 100% on RA, normal by my interpretation.    COORDINATION OF CARE: 7:51 AM Discussed ED treatment with pt including rectal exam and pt agrees.   Results for orders placed during the hospital encounter of 11/29/12  CBC WITH DIFFERENTIAL      Result Value Range   WBC 4.3  4.0 - 10.5 K/uL   RBC 4.96  4.22 - 5.81 MIL/uL   Hemoglobin 14.7  13.0 - 17.0 g/dL   HCT 16.1  09.6 - 04.5 %   MCV 84.9  78.0 - 100.0 fL   MCH 29.6  26.0 - 34.0 pg   MCHC 34.9  30.0 - 36.0 g/dL   RDW 40.9  81.1 - 91.4 %   Platelets 222  150 - 400 K/uL   Neutrophils Relative 39 (*) 43 - 77 %   Neutro Abs 1.7  1.7 - 7.7 K/uL   Lymphocytes Relative 50 (*) 12 - 46 %   Lymphs Abs 2.1  0.7 - 4.0 K/uL   Monocytes Relative 8  3 - 12 %   Monocytes Absolute 0.4  0.1 - 1.0 K/uL   Eosinophils Relative 3  0 - 5 %   Eosinophils Absolute 0.1  0.0 - 0.7 K/uL   Basophils Relative 1  0 - 1 %   Basophils Absolute 0.0  0.0 - 0.1 K/uL        No results found.   No diagnosis found.    MDM  Patient is hemodynamic stable.   Hemoglobin 14.7.     Rectal heme positive.     Referral to gastroenterology      I personally performed the services described in this documentation, which was scribed in my presence. The recorded information has been reviewed and is accurate.    Donnetta Hutching, MD 11/29/12 1112

## 2012-11-29 NOTE — ED Notes (Signed)
Pt resting in bed, awaiting disposition.  

## 2012-11-29 NOTE — ED Notes (Signed)
Rectal exam performed by dr.cook, results +, rn present throughout.

## 2012-11-29 NOTE — ED Notes (Signed)
Pt awaiting results, from md.  Dr. Adriana Simas notified.   Charge nurse aware.

## 2012-11-29 NOTE — ED Notes (Signed)
Pt c/o intermittent blood in stool over the last week. Pt states yesterday his stools were dark.

## 2012-11-29 NOTE — ED Notes (Signed)
Pt states he has noticed some blood in stool for the past few weeks. Noticed more dark blood yesterday in stool. Pt states no PCP & has never had a colonoscopy. Did undergo prostrate radiation last year.

## 2012-12-02 LAB — OCCULT BLOOD, POC DEVICE: Fecal Occult Bld: POSITIVE — AB

## 2012-12-10 ENCOUNTER — Encounter (HOSPITAL_COMMUNITY): Payer: Self-pay | Admitting: Pharmacy Technician

## 2012-12-10 ENCOUNTER — Ambulatory Visit (INDEPENDENT_AMBULATORY_CARE_PROVIDER_SITE_OTHER): Payer: BC Managed Care – PPO | Admitting: Gastroenterology

## 2012-12-10 ENCOUNTER — Encounter: Payer: Self-pay | Admitting: Gastroenterology

## 2012-12-10 ENCOUNTER — Other Ambulatory Visit: Payer: Self-pay | Admitting: Internal Medicine

## 2012-12-10 VITALS — BP 129/72 | HR 64 | Temp 97.8°F | Ht 66.0 in | Wt 215.2 lb

## 2012-12-10 DIAGNOSIS — K625 Hemorrhage of anus and rectum: Secondary | ICD-10-CM

## 2012-12-10 MED ORDER — PEG 3350-KCL-NA BICARB-NACL 420 G PO SOLR: 4000.0000 mL | ORAL | Status: DC

## 2012-12-10 NOTE — Progress Notes (Signed)
Cc PCP 

## 2012-12-10 NOTE — Assessment & Plan Note (Signed)
62 year old gentleman with intermittent rectal bleeding over the last several weeks. No prior colonoscopy. History of radiation treatments for prostate cancer and being in early 2013. Suspect benign anorectal bleeding versus proctitis. Differential also includes malignancy however. Recommend colonoscopy in the near future.  I have discussed the risks, alternatives, benefits with regards to but not limited to the risk of reaction to medication, bleeding, infection, perforation and the patient is agreeable to proceed. Written consent to be obtained.

## 2012-12-10 NOTE — Patient Instructions (Addendum)
1. We have scheduled you for a colonoscopy with Dr. Jena Gauss. Please see separate instructions.

## 2012-12-10 NOTE — Progress Notes (Signed)
Primary Care Physician:  Pershing Proud  Primary Gastroenterologist:  Roetta Sessions, MD  Chief Complaint  Patient presents with  . Rectal Bleeding    HPI:  Steven [name] is a 62 y.o. male here for recent small volume hematochezia over the last several weeks. Usually has BM QOD in past but for last 2-3 years will go more often. Now about two stools per day. No rectal pain. No abdominal pain. No heartburn. No dysphagia. No weight loss. Appetite good. Finished radiation for prostate cancer in 09/2011. Followed by Dr. Annabell Howells. No prior colonoscopy.  Current Outpatient Prescriptions  Medication Sig Dispense Refill  . amLODipine (NORVASC) 10 MG tablet Take 10 mg by mouth daily.      . fish oil-omega-3 fatty acids 1000 MG capsule Take 2 g by mouth daily.      . hydrochlorothiazide (HYDRODIURIL) 25 MG tablet Take 25 mg by mouth daily.      . hydrocortisone (ANUSOL-HC) 25 MG suppository Place 1 suppository (25 mg total) rectally 3 (three) times daily. For 7 days  20 suppository  1  . tadalafil (CIALIS) 10 MG tablet Take 10 mg by mouth daily as needed.       No current facility-administered medications for this visit.    Allergies as of 12/10/2012  . (No Known Allergies)    Past Medical History  Diagnosis Date  . Hypertension   . Arthritis   . Cancer     prostate s/p radiation Mar 2013  . History of radiation therapy 08/22/11-10/13/11    prostate  . Chest pain   . Hyperlipemia     Past Surgical History  Procedure Laterality Date  . Kidney surgery  1982    Family History  Problem Relation Age of Onset  . Colon cancer Neg Hx     History   Social History  . Marital Status: Single    Spouse Name: N/A    Number of Children: N/A  . 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 Smoking status: Former Smoker -- 1.00 packs/day for 20 years    Types: Cigarettes    Quit date: 07/13/2011  . Smokeless  tobacco: Not on file  . Alcohol Use: No     Comment: rarely  . Drug Use: No  . Sexually Active: Not on file   Other Topics Concern  . Not on file   Social History Narrative   Divorced      ROS:  General: Negative for anorexia, weight loss, fever, chills, fatigue, weakness. Eyes: Negative for vision changes.  ENT: Negative for hoarseness, difficulty swallowing , nasal congestion. CV: Negative for chest pain, angina, palpitations, dyspnea on exertion, peripheral edema.  Respiratory: Negative for dyspnea at rest, dyspnea on exertion, cough, sputum, wheezing.  GI: See history of present illness. GU:  Negative for dysuria, hematuria, urinary incontinence, urinary frequency, nocturnal urination.  MS: Negative for joint pain, low back pain.  Derm: Negative for rash or itching.  Neuro: Negative for weakness, abnormal sensation, seizure, frequent headaches, memory loss, confusion.  Psych: Negative for anxiety, depression, suicidal ideation, hallucinations.  Endo: Negative for unusual weight change.  Heme: Negative for bruising or bleeding. Allergy: Negative for rash or hives.    Physical Examination:  BP 129/72  Pulse 64  Temp(Src) 97.8 F (36.6 C) (Oral)  Ht 5\' 6"  (1.676 m)  Wt 215 lb 3.2 oz (97.614 kg)  BMI 34.75 kg/m2  General: Well-nourished, well-developed in no acute distress.  Head: Normocephalic, atraumatic.   Eyes: Conjunctiva pink, no icterus. Mouth: Oropharyngeal mucosa moist and pink , no lesions erythema or exudate. Neck: Supple without thyromegaly, masses, or lymphadenopathy.  Lungs: Clear to auscultation bilaterally.  Heart: Regular rate and rhythm, no murmurs rubs or gallops.  Abdomen: Bowel sounds are normal, nontender, nondistended, no hepatosplenomegaly or masses, no abdominal bruits or    hernia , no rebound or guarding.   Rectal: not performed Extremities: No lower extremity edema. No clubbing or deformities.  Neuro: Alert and oriented x 4 , grossly  normal neurologically.  Skin: Warm and dry, no rash or jaundice.   Psych: Alert and cooperative, normal mood and affect.  Labs: Lab Results  Component Value Date   WBC 4.3 11/29/2012   HGB 14.7 11/29/2012   HCT 42.1 11/29/2012   MCV 84.9 11/29/2012   PLT 222 11/29/2012     Imaging Studies: No results found.

## 2012-12-19 ENCOUNTER — Encounter (HOSPITAL_COMMUNITY): Payer: Self-pay

## 2012-12-19 ENCOUNTER — Encounter (HOSPITAL_COMMUNITY)
Admission: RE | Disposition: A | Discharge status: Home / Self Care | Payer: Self-pay | Source: Ambulatory Visit | Attending: Internal Medicine

## 2012-12-19 ENCOUNTER — Ambulatory Visit (HOSPITAL_COMMUNITY)
Admission: RE | Admit: 2012-12-19 | Discharge: 2012-12-19 | Disposition: A | Discharge status: Home / Self Care | Payer: BC Managed Care – PPO | Source: Ambulatory Visit | Attending: Internal Medicine | Admitting: Internal Medicine

## 2012-12-19 DIAGNOSIS — Y842 Radiological procedure and radiotherapy as the cause of abnormal reaction of the patient, or of later complication, without mention of misadventure at the time of the procedure: Secondary | ICD-10-CM | POA: Insufficient documentation

## 2012-12-19 DIAGNOSIS — K625 Hemorrhage of anus and rectum: Secondary | ICD-10-CM

## 2012-12-19 DIAGNOSIS — C61 Malignant neoplasm of prostate: Secondary | ICD-10-CM | POA: Insufficient documentation

## 2012-12-19 DIAGNOSIS — K6289 Other specified diseases of anus and rectum: Secondary | ICD-10-CM

## 2012-12-19 DIAGNOSIS — K921 Melena: Secondary | ICD-10-CM | POA: Insufficient documentation

## 2012-12-19 DIAGNOSIS — K648 Other hemorrhoids: Secondary | ICD-10-CM | POA: Insufficient documentation

## 2012-12-19 DIAGNOSIS — I10 Essential (primary) hypertension: Secondary | ICD-10-CM | POA: Insufficient documentation

## 2012-12-19 HISTORY — PX: COLONOSCOPY: SHX5424

## 2012-12-19 SURGERY — COLONOSCOPY
Anesthesia: Moderate Sedation

## 2012-12-19 MED ORDER — MEPERIDINE HCL 100 MG/ML IJ SOLN
INTRAMUSCULAR | Status: AC
  Filled 2012-12-19: qty 1

## 2012-12-19 MED ORDER — MEPERIDINE HCL 100 MG/ML IJ SOLN
INTRAMUSCULAR | Status: DC | PRN
  Administered 2012-12-19 (×2): 25 mg via INTRAVENOUS
  Administered 2012-12-19: 50 mg via INTRAVENOUS
  Administered 2012-12-19: 25 mg via INTRAVENOUS

## 2012-12-19 MED ORDER — MIDAZOLAM HCL 5 MG/5ML IJ SOLN
INTRAMUSCULAR | Status: DC | PRN
  Administered 2012-12-19: 1 mg via INTRAVENOUS
  Administered 2012-12-19: 2 mg via INTRAVENOUS
  Administered 2012-12-19 (×2): 1 mg via INTRAVENOUS
  Administered 2012-12-19: 2 mg via INTRAVENOUS

## 2012-12-19 MED ORDER — MIDAZOLAM HCL 5 MG/5ML IJ SOLN
INTRAMUSCULAR | Status: AC
  Filled 2012-12-19: qty 10

## 2012-12-19 MED ORDER — ONDANSETRON HCL 4 MG/2ML IJ SOLN
INTRAMUSCULAR | Status: DC | PRN
  Administered 2012-12-19: 4 mg via INTRAVENOUS

## 2012-12-19 MED ORDER — ONDANSETRON HCL 4 MG/2ML IJ SOLN
INTRAMUSCULAR | Status: AC
  Filled 2012-12-19: qty 2

## 2012-12-19 MED ORDER — STERILE WATER FOR IRRIGATION IR SOLN
Status: DC | PRN
  Administered 2012-12-19: 14:00:00

## 2012-12-19 MED ORDER — SODIUM CHLORIDE 0.9 % IV SOLN: INTRAVENOUS | Status: DC

## 2012-12-19 NOTE — Interval H&P Note (Signed)
History and Physical Interval Note:  12/19/2012 1:40 PM  Steven [name]  has presented today for surgery, with the diagnosis of RECTAL BLEEDING  The various methods of treatment have been discussed with the patient and family. After consideration of risks, benefits and other options for treatment, the patient has consented to  Procedure(s) with comments: COLONOSCOPY (N/A) - 12:45 as a surgical intervention .  The patient's history has been reviewed, patient examined, no change in status, stable for surgery.  I have reviewed the patient's chart and labs.  Questions were answered to the patient's satisfaction.     Eula Listen  Colonoscopy per plan.The risks, benefits, limitations, alternatives and imponderables have been reviewed with the patient. Questions have been answered. All parties are agreeable.

## 2012-12-19 NOTE — Op Note (Signed)
[facility] [address] [place], [address]   COLONOSCOPY PROCEDURE REPORT  PATIENT: [name], [name]  MR#:         [id number] BIRTHDATE: 1951/05/29 , 62  yrs. old GENDER: Male ENDOSCOPIST: R.  Roetta Sessions, MD FACP FACG REFERRED BY:  Terie Purser, P.A. PROCEDURE DATE:  12/19/2012 PROCEDURE:     Colonoscopy with bleeding controlled therapy  INDICATIONS: hematochezia  INFORMED CONSENT:  The risks, benefits, alternatives and imponderables including but not limited to bleeding, perforation as well as the possibility of a missed lesion have been reviewed.  The potential for biopsy, lesion removal, etc. have also been discussed.  Questions have been answered.  All parties agreeable. Please see the history and physical in the medical record for more information.  MEDICATIONS: Versed 7 mg IV and Demerol 125 mg IV in divided doses. Zofran 4 mg IV  DESCRIPTION OF PROCEDURE:  After a digital rectal exam was performed, the EC-[id number] ([id number])  colonoscope was advanced from the anus through the rectum and colon to the area of the cecum, ileocecal valve and appendiceal orifice.  The cecum was deeply intubated.  These structures were well-seen and photographed for the record.  From the level of the cecum and ileocecal valve, the scope was slowly and cautiously withdrawn.  The mucosal surfaces were carefully surveyed utilizing scope tip deflection to facilitate fold flattening as needed.  The scope was pulled down into the rectum where a thorough examination including retroflexion was performed.    FINDINGS:  neovascular changes involving the anterior rectal mucosa with active oozing. Changes consistent with radiation induced proctitis. Internal hemorrhoids..  Otherwise, the remainder of the rectum  appeared normal. Normal-appearing colonic mucosa.  THERAPEUTIC / DIAGNOSTIC MANEUVERS PERFORMED:  the bleeding neovascular vessels in the rectum were sealed with  multiple applications of the APC circular probe on rectal setting.  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 Normal appearing colon.  RECOMMENDATIONS:    Stool softener x1 month. Office followup with [place] in 6 weeks.   _______________________________ eSigned:  R. Roetta Sessions, MD FACP [facility] 12/19/2012 2:30 PM   CC:    PATIENT NAME:  Steven [name], Steven [name] MR#: [id number]

## 2012-12-19 NOTE — H&P (View-Only) (Signed)
Primary Care Physician:  JACKSON,SAMANTHA, PA-C  Primary Gastroenterologist:  Michael Rourk, MD  Chief Complaint  Patient presents with  . Rectal Bleeding    HPI:  Steven [name] is a 62 y.o. male here for recent small volume hematochezia over the last several weeks. Usually has BM QOD in past but for last 2-3 years will go more often. Now about two stools per day. No rectal pain. No abdominal pain. No heartburn. No dysphagia. No weight loss. Appetite good. Finished radiation for prostate cancer in 09/2011. Followed by Dr. Wrenn. No prior colonoscopy.  Current Outpatient Prescriptions  Medication Sig Dispense Refill  . amLODipine (NORVASC) 10 MG tablet Take 10 mg by mouth daily.      . fish oil-omega-3 fatty acids 1000 MG capsule Take 2 g by mouth daily.      . hydrochlorothiazide (HYDRODIURIL) 25 MG tablet Take 25 mg by mouth daily.      . hydrocortisone (ANUSOL-HC) 25 MG suppository Place 1 suppository (25 mg total) rectally 3 (three) times daily. For 7 days  20 suppository  1  . tadalafil (CIALIS) 10 MG tablet Take 10 mg by mouth daily as needed.       No current facility-administered medications for this visit.    Allergies as of 12/10/2012  . (No Known Allergies)    Past Medical History  Diagnosis Date  . Hypertension   . Arthritis   . Cancer     prostate s/p radiation Mar 2013  . History of radiation therapy 08/22/11-10/13/11    prostate  . Chest pain   . Hyperlipemia     Past Surgical History  Procedure Laterality Date  . Kidney surgery  1982    Family History  Problem Relation Age of Onset  . Colon cancer Neg Hx     History   Social History  . Marital Status: Single    Spouse Name: N/A    Number of Children: N/A  . 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 Smoking status: Former Smoker -- 1.00 packs/day for 20 years    Types: Cigarettes    Quit date: 07/13/2011  . Smokeless  tobacco: Not on file  . Alcohol Use: No     Comment: rarely  . Drug Use: No  . Sexually Active: Not on file   Other Topics Concern  . Not on file   Social History Narrative   Divorced      ROS:  General: Negative for anorexia, weight loss, fever, chills, fatigue, weakness. Eyes: Negative for vision changes.  ENT: Negative for hoarseness, difficulty swallowing , nasal congestion. CV: Negative for chest pain, angina, palpitations, dyspnea on exertion, peripheral edema.  Respiratory: Negative for dyspnea at rest, dyspnea on exertion, cough, sputum, wheezing.  GI: See history of present illness. GU:  Negative for dysuria, hematuria, urinary incontinence, urinary frequency, nocturnal urination.  MS: Negative for joint pain, low back pain.  Derm: Negative for rash or itching.  Neuro: Negative for weakness, abnormal sensation, seizure, frequent headaches, memory loss, confusion.  Psych: Negative for anxiety, depression, suicidal ideation, hallucinations.  Endo: Negative for unusual weight change.  Heme: Negative for bruising or bleeding. Allergy: Negative for rash or hives.    Physical Examination:  BP 129/72  Pulse 64  Temp(Src) 97.8 F (36.6 C) (Oral)  Ht 5' 6" (1.676 m)  Wt 215 lb 3.2 oz (97.614 kg)  BMI 34.75 kg/m2     General: Well-nourished, well-developed in no acute distress.  Head: Normocephalic, atraumatic.   Eyes: Conjunctiva pink, no icterus. Mouth: Oropharyngeal mucosa moist and pink , no lesions erythema or exudate. Neck: Supple without thyromegaly, masses, or lymphadenopathy.  Lungs: Clear to auscultation bilaterally.  Heart: Regular rate and rhythm, no murmurs rubs or gallops.  Abdomen: Bowel sounds are normal, nontender, nondistended, no hepatosplenomegaly or masses, no abdominal bruits or    hernia , no rebound or guarding.   Rectal: not performed Extremities: No lower extremity edema. No clubbing or deformities.  Neuro: Alert and oriented x 4 , grossly  normal neurologically.  Skin: Warm and dry, no rash or jaundice.   Psych: Alert and cooperative, normal mood and affect.  Labs: Lab Results  Component Value Date   WBC 4.3 11/29/2012   HGB 14.7 11/29/2012   HCT 42.1 11/29/2012   MCV 84.9 11/29/2012   PLT 222 11/29/2012     Imaging Studies: No results found.    

## 2012-12-20 ENCOUNTER — Encounter (HOSPITAL_COMMUNITY): Payer: Self-pay | Admitting: Internal Medicine

## 2013-01-03 ENCOUNTER — Ambulatory Visit (INDEPENDENT_AMBULATORY_CARE_PROVIDER_SITE_OTHER): Payer: BC Managed Care – PPO | Admitting: Urology

## 2013-01-03 DIAGNOSIS — C61 Malignant neoplasm of prostate: Secondary | ICD-10-CM

## 2013-01-03 DIAGNOSIS — N529 Male erectile dysfunction, unspecified: Secondary | ICD-10-CM

## 2013-01-29 ENCOUNTER — Encounter: Payer: Self-pay | Admitting: Internal Medicine

## 2013-01-30 ENCOUNTER — Encounter: Payer: Self-pay | Admitting: Gastroenterology

## 2013-01-30 ENCOUNTER — Ambulatory Visit (INDEPENDENT_AMBULATORY_CARE_PROVIDER_SITE_OTHER): Payer: BC Managed Care – PPO | Admitting: Gastroenterology

## 2013-01-30 VITALS — BP 132/79 | HR 68 | Temp 97.6°F | Ht 66.0 in | Wt 220.8 lb

## 2013-01-30 DIAGNOSIS — K627 Radiation proctitis: Secondary | ICD-10-CM | POA: Insufficient documentation

## 2013-01-30 DIAGNOSIS — K6289 Other specified diseases of anus and rectum: Secondary | ICD-10-CM

## 2013-01-30 MED ORDER — MESALAMINE 1000 MG RE SUPP: 1000.0000 mg | Freq: Every day | RECTAL | Status: DC

## 2013-01-30 NOTE — Progress Notes (Signed)
Cc PCP 

## 2013-01-30 NOTE — Progress Notes (Signed)
Primary Care Physician: Pershing Proud  Primary Gastroenterologist:  Roetta Sessions, MD   Chief Complaint  Patient presents with  . Follow-up    HPI: Steven [name] is a 62 y.o. male here for followup of recent colonoscopy. Colonoscopy was performed due to small volume hematochezia. 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 He also had internal hemorrhoids. He completed radiation therapy for prostate cancer back in March of 2013.  Has had about 3-4 episodes of small volume brbpr since the TCS with APC ablation. No melena. No straining or having hard stools. Some fecal urgency. Couple of stools per day. Used to have more constipation before. Denies any upper GI symptoms.  Current Outpatient Prescriptions  Medication Sig Dispense Refill  . amLODipine (NORVASC) 10 MG tablet Take 10 mg by mouth daily.      . fish oil-omega-3 fatty acids 1000 MG capsule Take 2 g by mouth daily.      . hydrochlorothiazide (HYDRODIURIL) 25 MG tablet Take 25 mg by mouth daily.      . tadalafil (CIALIS) 10 MG tablet Take 10 mg by mouth daily as needed for erectile dysfunction.        No current facility-administered medications for this visit.    Allergies as of 01/30/2013  . (No Known Allergies)    ROS:  General: Negative for anorexia, weight loss, fever, chills, fatigue, weakness. ENT: Negative for hoarseness, difficulty swallowing , nasal congestion. CV: Negative for chest pain, angina, palpitations, dyspnea on exertion, peripheral edema.  Respiratory: Negative for dyspnea at rest, dyspnea on exertion, cough, sputum, wheezing.  GI: See history of present illness. GU:  Negative for dysuria, hematuria, urinary incontinence, urinary frequency, nocturnal urination.  Endo: Negative for unusual weight change.    Physical Examination:   BP 132/79  Pulse 68  Temp(Src) 97.6 F (36.4 C) (Oral)  Ht 5\' 6"  (1.676 m)  Wt 220 lb 12.8 oz  (100.154 kg)  BMI 35.65 kg/m2  General: Well-nourished, well-developed in no acute distress.  Eyes: No icterus. Mouth: Oropharyngeal mucosa moist and pink , no lesions erythema or exudate. Lungs: Clear to auscultation bilaterally.  Heart: Regular rate and rhythm, no murmurs rubs or gallops.  Abdomen: Bowel sounds are normal, nontender, nondistended, no hepatosplenomegaly or masses, no abdominal bruits or hernia , no rebound or guarding.   Extremities: No lower extremity edema. No clubbing or deformities. Neuro: Alert and oriented x 4   Skin: Warm and dry, no jaundice.   Psych: Alert and cooperative, normal mood and affect.  Labs:  Lab Results  Component Value Date   WBC 4.3 11/29/2012   HGB 14.7 11/29/2012   HCT 42.1 11/29/2012   MCV 84.9 11/29/2012   PLT 222 11/29/2012    Imaging Studies: No results found.

## 2013-01-30 NOTE — Assessment & Plan Note (Signed)
Clinically improved. Bleeding has lessened, he does have some tenesmus. Trial of Canasa 1000 mg per rectum at bedtime for 30 days. Office visit as needed.

## 2013-01-30 NOTE — Patient Instructions (Addendum)
1. Place 1 suppository per rectum at bedtime for 30 days. Samples provided. Rebate card for name brand did Canasa provided. May be cheaper than generic with a rebate card, please check with your pharmacy. 2. Office visit as needed.

## 2013-02-26 ENCOUNTER — Telehealth: Payer: Self-pay

## 2013-02-26 NOTE — Telephone Encounter (Signed)
Pt aware. Samples at the front, pt will come by today to pick them up.  Susan,please schedule ov in 3 weeks.

## 2013-02-26 NOTE — Telephone Encounter (Signed)
Give two more weeks of Canasa suppositories samples. One PR at bedtime.  OV in 3 weeks to reassess. ?needs repeat APC.

## 2013-02-26 NOTE — Telephone Encounter (Signed)
Pt called- left voicemail- he used the canasa suppositories that [facility] gave him and went to get the rx and it was still $700.00. Pt cannot afford that. He also stated he was still having some bleeding at times. He wants to know what he should do.

## 2013-02-26 NOTE — Telephone Encounter (Signed)
Pt is aware of OV on 8/27 at 0945 with [facility] and appt card was given along with his samples

## 2013-02-26 NOTE — Telephone Encounter (Signed)
Pt said he would call back around 4 to check with JL if she has heard anything back from provider. He is at work and his cell phone is charging at the moment.

## 2013-03-18 ENCOUNTER — Telehealth: Payer: Self-pay

## 2013-03-18 NOTE — Telephone Encounter (Signed)
Pt came by the office and asked for samples of Canasa. I gave him one box of 3 ( 1000 mg) suppositories. He has appt with Tana Coast, PA tomorrow.

## 2013-03-19 ENCOUNTER — Encounter: Payer: Self-pay | Admitting: Gastroenterology

## 2013-03-19 ENCOUNTER — Ambulatory Visit (INDEPENDENT_AMBULATORY_CARE_PROVIDER_SITE_OTHER): Payer: BC Managed Care – PPO | Admitting: Gastroenterology

## 2013-03-19 VITALS — BP 128/75 | HR 64 | Temp 97.8°F | Ht 66.0 in | Wt 221.4 lb

## 2013-03-19 DIAGNOSIS — K625 Hemorrhage of anus and rectum: Secondary | ICD-10-CM

## 2013-03-19 DIAGNOSIS — K6289 Other specified diseases of anus and rectum: Secondary | ICD-10-CM

## 2013-03-19 DIAGNOSIS — K627 Radiation proctitis: Secondary | ICD-10-CM

## 2013-03-19 MED ORDER — MESALAMINE 1000 MG RE SUPP: 1000.0000 mg | RECTAL | Status: DC

## 2013-03-19 NOTE — Patient Instructions (Addendum)
1. Continue Canasa suppositories, every other night for the next 30 days. 2. If you continue to have bleeding and/or you decide to pursue APC treatment, please let me know. At this time, we cannot rule out bleeding due to your hemorrhoids.

## 2013-03-19 NOTE — Progress Notes (Signed)
CC'd to PCP 

## 2013-03-19 NOTE — Progress Notes (Signed)
Primary Care Physician: Pershing Proud  Primary Gastroenterologist:  Roetta Sessions, MD   Chief Complaint  Patient presents with  . Follow-up    HPI: Steven [name] is a 62 y.o. male here for followup visit. His last seen in July 2014. 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 On 12/19/2012 he underwent APC ablation. He has been on Canasa suppositories off-and-on since the last office visit. He also had internal hemorrhoids.  Used Canasa for two weeks. Could not afford RX. Given samples twice. Therapy was interrupted. Last 3-4 BMs no bleeding. Has had intermittent brbpr. No tenesmus. BM 1-2 per day. Occasional QOD. Formed. No abdominal pain/rectal pain.   Current Outpatient Prescriptions  Medication Sig Dispense Refill  . amLODipine (NORVASC) 10 MG tablet Take 10 mg by mouth daily.      Marland Kitchen aspirin 81 MG tablet Take 81 mg by mouth daily.      . hydrochlorothiazide (HYDRODIURIL) 25 MG tablet Take 25 mg by mouth daily.      . mesalamine (CANASA) 1000 MG suppository Place 1 suppository (1,000 mg total) rectally at bedtime.  30 suppository  1  . minocycline (DYNACIN) 100 MG tablet Take 100 mg by mouth 2 (two) times daily.      . simvastatin (ZOCOR) 10 MG tablet Take 20 mg by mouth at bedtime.       . tadalafil (CIALIS) 10 MG tablet Take 10 mg by mouth daily as needed for erectile dysfunction.        No current facility-administered medications for this visit.    Allergies as of 03/19/2013  . (No Known Allergies)    ROS:  General: Negative for anorexia, weight loss, fever, chills, fatigue, weakness. ENT: Negative for hoarseness, difficulty swallowing , nasal congestion. CV: Negative for chest pain, angina, palpitations, dyspnea on exertion, peripheral edema.  Respiratory: Negative for dyspnea at rest, dyspnea on exertion, cough, sputum, wheezing.  GI: See history of present illness. GU:  Negative for dysuria, hematuria, urinary incontinence,  urinary frequency, nocturnal urination.  Endo: Negative for unusual weight change.    Physical Examination:   BP 128/75  Pulse 64  Temp(Src) 97.8 F (36.6 C) (Oral)  Ht 5\' 6"  (1.676 m)  Wt 221 lb 6.4 oz (100.426 kg)  BMI 35.75 kg/m2  General: Well-nourished, well-developed in no acute distress.  Eyes: No icterus. Mouth: Oropharyngeal mucosa moist and pink , no lesions erythema or exudate. Lungs: Clear to auscultation bilaterally.  Heart: Regular rate and rhythm, no murmurs rubs or gallops.  Abdomen: Bowel sounds are normal, nontender, nondistended, no hepatosplenomegaly or masses, no abdominal bruits or hernia , no rebound or guarding.   Extremities: No lower extremity edema. No clubbing or deformities. Neuro: Alert and oriented x 4   Skin: Warm and dry, no jaundice.   Psych: Alert and cooperative, normal mood and affect.

## 2013-03-19 NOTE — Assessment & Plan Note (Signed)
Discussed at length with patient. Likely bleeding from proctitis but cannot exclude hemorrhoidal bleeding. Offered repeat APC treatment which would assist in making a diagnosis as well. Patient not interested at this time. Trial of Canasa 1 g per rectum every other day for 30 days. Unfortunately patient cannot afford the prescription and samples are limited. He will call if ongoing problems or if he decides to pursue APC

## 2013-07-11 ENCOUNTER — Ambulatory Visit: Payer: BC Managed Care – PPO | Admitting: Urology

## 2013-08-22 ENCOUNTER — Ambulatory Visit (INDEPENDENT_AMBULATORY_CARE_PROVIDER_SITE_OTHER): Payer: BC Managed Care – PPO | Admitting: Urology

## 2013-08-22 DIAGNOSIS — C61 Malignant neoplasm of prostate: Secondary | ICD-10-CM

## 2013-08-22 DIAGNOSIS — N529 Male erectile dysfunction, unspecified: Secondary | ICD-10-CM

## 2014-11-13 ENCOUNTER — Ambulatory Visit (INDEPENDENT_AMBULATORY_CARE_PROVIDER_SITE_OTHER): Payer: BC Managed Care – PPO | Admitting: Urology

## 2014-11-13 DIAGNOSIS — C61 Malignant neoplasm of prostate: Secondary | ICD-10-CM | POA: Diagnosis not present

## 2014-11-13 DIAGNOSIS — N5201 Erectile dysfunction due to arterial insufficiency: Secondary | ICD-10-CM | POA: Diagnosis not present

## 2014-11-23 ENCOUNTER — Other Ambulatory Visit (HOSPITAL_COMMUNITY): Payer: Self-pay | Admitting: Family Medicine

## 2014-11-23 ENCOUNTER — Ambulatory Visit (HOSPITAL_COMMUNITY)
Admission: RE | Admit: 2014-11-23 | Discharge: 2014-11-23 | Disposition: A | Discharge status: Home / Self Care | Payer: BC Managed Care – PPO | Source: Ambulatory Visit | Attending: Family Medicine | Admitting: Family Medicine

## 2014-11-23 DIAGNOSIS — R05 Cough: Secondary | ICD-10-CM

## 2014-11-23 DIAGNOSIS — R059 Cough, unspecified: Secondary | ICD-10-CM

## 2014-11-23 DIAGNOSIS — R0989 Other specified symptoms and signs involving the circulatory and respiratory systems: Secondary | ICD-10-CM | POA: Insufficient documentation

## 2015-05-14 ENCOUNTER — Ambulatory Visit: Payer: BC Managed Care – PPO | Admitting: Urology

## 2015-07-02 ENCOUNTER — Ambulatory Visit (INDEPENDENT_AMBULATORY_CARE_PROVIDER_SITE_OTHER): Payer: BC Managed Care – PPO | Admitting: Urology

## 2015-07-02 DIAGNOSIS — N5201 Erectile dysfunction due to arterial insufficiency: Secondary | ICD-10-CM

## 2015-07-02 DIAGNOSIS — C61 Malignant neoplasm of prostate: Secondary | ICD-10-CM | POA: Diagnosis not present

## 2015-07-02 DIAGNOSIS — R351 Nocturia: Secondary | ICD-10-CM

## 2015-08-17 ENCOUNTER — Encounter (HOSPITAL_COMMUNITY): Payer: Self-pay | Admitting: Emergency Medicine

## 2015-08-17 ENCOUNTER — Emergency Department (HOSPITAL_COMMUNITY): Payer: Worker's Compensation

## 2015-08-17 ENCOUNTER — Observation Stay (HOSPITAL_COMMUNITY)
Admission: EM | Admit: 2015-08-17 | Discharge: 2015-08-19 | Disposition: A | Discharge status: Home / Self Care | Payer: Worker's Compensation | Attending: Surgery | Admitting: Surgery

## 2015-08-17 DIAGNOSIS — W08XXXA Fall from other furniture, initial encounter: Secondary | ICD-10-CM | POA: Diagnosis not present

## 2015-08-17 DIAGNOSIS — M50323 Other cervical disc degeneration at C6-C7 level: Secondary | ICD-10-CM | POA: Diagnosis not present

## 2015-08-17 DIAGNOSIS — Y9389 Activity, other specified: Secondary | ICD-10-CM | POA: Diagnosis not present

## 2015-08-17 DIAGNOSIS — M1990 Unspecified osteoarthritis, unspecified site: Secondary | ICD-10-CM | POA: Diagnosis not present

## 2015-08-17 DIAGNOSIS — T148XXA Other injury of unspecified body region, initial encounter: Secondary | ICD-10-CM

## 2015-08-17 DIAGNOSIS — S52022B Displaced fracture of olecranon process without intraarticular extension of left ulna, initial encounter for open fracture type I or II: Secondary | ICD-10-CM | POA: Diagnosis not present

## 2015-08-17 DIAGNOSIS — I10 Essential (primary) hypertension: Secondary | ICD-10-CM | POA: Insufficient documentation

## 2015-08-17 DIAGNOSIS — M50322 Other cervical disc degeneration at C5-C6 level: Secondary | ICD-10-CM | POA: Insufficient documentation

## 2015-08-17 DIAGNOSIS — E669 Obesity, unspecified: Secondary | ICD-10-CM | POA: Diagnosis not present

## 2015-08-17 DIAGNOSIS — Z923 Personal history of irradiation: Secondary | ICD-10-CM | POA: Insufficient documentation

## 2015-08-17 DIAGNOSIS — S2249XA Multiple fractures of ribs, unspecified side, initial encounter for closed fracture: Secondary | ICD-10-CM

## 2015-08-17 DIAGNOSIS — Z6835 Body mass index (BMI) 35.0-35.9, adult: Secondary | ICD-10-CM | POA: Insufficient documentation

## 2015-08-17 DIAGNOSIS — E785 Hyperlipidemia, unspecified: Secondary | ICD-10-CM | POA: Insufficient documentation

## 2015-08-17 DIAGNOSIS — M47812 Spondylosis without myelopathy or radiculopathy, cervical region: Secondary | ICD-10-CM | POA: Insufficient documentation

## 2015-08-17 DIAGNOSIS — S2242XA Multiple fractures of ribs, left side, initial encounter for closed fracture: Principal | ICD-10-CM | POA: Insufficient documentation

## 2015-08-17 DIAGNOSIS — Z87891 Personal history of nicotine dependence: Secondary | ICD-10-CM | POA: Insufficient documentation

## 2015-08-17 DIAGNOSIS — S42402B Unspecified fracture of lower end of left humerus, initial encounter for open fracture: Secondary | ICD-10-CM | POA: Diagnosis present

## 2015-08-17 DIAGNOSIS — S2239XA Fracture of one rib, unspecified side, initial encounter for closed fracture: Secondary | ICD-10-CM | POA: Diagnosis present

## 2015-08-17 DIAGNOSIS — Z8546 Personal history of malignant neoplasm of prostate: Secondary | ICD-10-CM | POA: Diagnosis not present

## 2015-08-17 DIAGNOSIS — W19XXXA Unspecified fall, initial encounter: Secondary | ICD-10-CM | POA: Diagnosis present

## 2015-08-17 HISTORY — DX: Multiple fractures of ribs, unspecified side, initial encounter for closed fracture: S22.49XA

## 2015-08-17 HISTORY — DX: Other injury of unspecified body region, initial encounter: T14.8XXA

## 2015-08-17 LAB — CBC WITH DIFFERENTIAL/PLATELET
Basophils Absolute: 0 10*3/uL (ref 0.0–0.1)
Basophils Relative: 0 %
Eosinophils Absolute: 0.1 10*3/uL (ref 0.0–0.7)
Eosinophils Relative: 1 %
HCT: 45 % (ref 39.0–52.0)
Hemoglobin: 15.1 g/dL (ref 13.0–17.0)
Lymphocytes Relative: 22 %
Lymphs Abs: 1.7 10*3/uL (ref 0.7–4.0)
MCH: 29.2 pg (ref 26.0–34.0)
MCHC: 33.6 g/dL (ref 30.0–36.0)
MCV: 87 fL (ref 78.0–100.0)
Monocytes Absolute: 0.3 10*3/uL (ref 0.1–1.0)
Monocytes Relative: 4 %
Neutro Abs: 5.7 10*3/uL (ref 1.7–7.7)
Neutrophils Relative %: 73 %
Platelets: 226 10*3/uL (ref 150–400)
RBC: 5.17 MIL/uL (ref 4.22–5.81)
RDW: 12.9 % (ref 11.5–15.5)
WBC: 7.9 10*3/uL (ref 4.0–10.5)

## 2015-08-17 LAB — I-STAT CHEM 8, ED
BUN: 12 mg/dL (ref 6–20)
Calcium, Ion: 1.18 mmol/L (ref 1.13–1.30)
Chloride: 100 mmol/L — ABNORMAL LOW (ref 101–111)
Creatinine, Ser: 1 mg/dL (ref 0.61–1.24)
Glucose, Bld: 111 mg/dL — ABNORMAL HIGH (ref 65–99)
HCT: 47 % (ref 39.0–52.0)
Hemoglobin: 16 g/dL (ref 13.0–17.0)
Potassium: 3.1 mmol/L — ABNORMAL LOW (ref 3.5–5.1)
Sodium: 144 mmol/L (ref 135–145)
TCO2: 30 mmol/L (ref 0–100)

## 2015-08-17 LAB — COMPREHENSIVE METABOLIC PANEL
ALT: 23 U/L (ref 17–63)
AST: 25 U/L (ref 15–41)
Albumin: 4.3 g/dL (ref 3.5–5.0)
Alkaline Phosphatase: 68 U/L (ref 38–126)
Anion gap: 11 (ref 5–15)
BUN: 10 mg/dL (ref 6–20)
CO2: 29 mmol/L (ref 22–32)
Calcium: 9.8 mg/dL (ref 8.9–10.3)
Chloride: 104 mmol/L (ref 101–111)
Creatinine, Ser: 1.05 mg/dL (ref 0.61–1.24)
GFR calc Af Amer: >60 mL/min (ref >60)
GFR calc non Af Amer: >60 mL/min (ref >60)
Glucose, Bld: 117 mg/dL — ABNORMAL HIGH (ref 65–99)
Potassium: 3.2 mmol/L — ABNORMAL LOW (ref 3.5–5.1)
Sodium: 144 mmol/L (ref 135–145)
Total Bilirubin: 0.6 mg/dL (ref 0.3–1.2)
Total Protein: 7.6 g/dL (ref 6.5–8.1)

## 2015-08-17 MED ORDER — FENTANYL CITRATE (PF) 100 MCG/2ML IJ SOLN
75.0000 ug | Freq: Once | INTRAMUSCULAR | Status: AC
  Administered 2015-08-17: 75 ug via INTRAVENOUS
  Filled 2015-08-17: qty 2

## 2015-08-17 MED ORDER — SODIUM CHLORIDE 0.9% FLUSH: 3.0000 mL | INTRAVENOUS | Status: DC | PRN

## 2015-08-17 MED ORDER — IOHEXOL 300 MG/ML  SOLN
100.0000 mL | Freq: Once | INTRAMUSCULAR | Status: AC | PRN
  Administered 2015-08-17: 100 mL via INTRAVENOUS

## 2015-08-17 MED ORDER — ONDANSETRON HCL 4 MG/2ML IJ SOLN
4.0000 mg | Freq: Four times a day (QID) | INTRAMUSCULAR | Status: DC | PRN
  Administered 2015-08-18 – 2015-08-19 (×2): 4 mg via INTRAVENOUS
  Filled 2015-08-17 (×2): qty 2

## 2015-08-17 MED ORDER — LIDOCAINE HCL (PF) 1 % IJ SOLN
10.0000 mL | Freq: Once | INTRAMUSCULAR | Status: AC
  Administered 2015-08-17: 10 mL via INTRADERMAL
  Filled 2015-08-17: qty 10

## 2015-08-17 MED ORDER — ENOXAPARIN SODIUM 40 MG/0.4ML ~~LOC~~ SOLN
40.0000 mg | SUBCUTANEOUS | Status: DC
Start: 2015-08-18 — End: 2015-08-18

## 2015-08-17 MED ORDER — HYDROCHLOROTHIAZIDE 25 MG PO TABS
25.0000 mg | ORAL_TABLET | Freq: Every day | ORAL | Status: DC
  Administered 2015-08-18 – 2015-08-19 (×2): 25 mg via ORAL
  Filled 2015-08-17 (×2): qty 1

## 2015-08-17 MED ORDER — AMLODIPINE BESYLATE 10 MG PO TABS
10.0000 mg | ORAL_TABLET | Freq: Every day | ORAL | Status: DC
  Administered 2015-08-18 – 2015-08-19 (×2): 10 mg via ORAL
  Filled 2015-08-17 (×2): qty 1

## 2015-08-17 MED ORDER — HYDROMORPHONE HCL 1 MG/ML IJ SOLN
1.0000 mg | Freq: Once | INTRAMUSCULAR | Status: AC
  Administered 2015-08-17: 1 mg via INTRAVENOUS
  Filled 2015-08-17: qty 1

## 2015-08-17 MED ORDER — ONDANSETRON HCL 4 MG PO TABS: 4.0000 mg | ORAL_TABLET | Freq: Four times a day (QID) | ORAL | Status: DC | PRN

## 2015-08-17 MED ORDER — SODIUM CHLORIDE 0.9 % IV SOLN: 250.0000 mL | INTRAVENOUS | Status: DC | PRN

## 2015-08-17 MED ORDER — KETOROLAC TROMETHAMINE 15 MG/ML IJ SOLN
15.0000 mg | Freq: Once | INTRAMUSCULAR | Status: DC
  Administered 2015-08-17: 15 mg via INTRAVENOUS
  Filled 2015-08-17: qty 1

## 2015-08-17 MED ORDER — HYDROMORPHONE HCL 1 MG/ML IJ SOLN
1.0000 mg | INTRAMUSCULAR | Status: DC | PRN
  Administered 2015-08-18 (×2): 1 mg via INTRAVENOUS
  Filled 2015-08-17 (×2): qty 1

## 2015-08-17 MED ORDER — OXYCODONE HCL 5 MG PO TABS: 10.0000 mg | ORAL_TABLET | ORAL | Status: DC | PRN

## 2015-08-17 MED ORDER — SODIUM CHLORIDE 0.9% FLUSH
3.0000 mL | Freq: Two times a day (BID) | INTRAVENOUS | Status: DC
  Administered 2015-08-18 – 2015-08-19 (×2): 3 mL via INTRAVENOUS

## 2015-08-17 NOTE — ED Notes (Signed)
Pt brought to ED after falling at work from a moving table, pt got hit mostly on his left side, pt has small laceration behind his left ear, on his left elbow and is c/o 9/10 left side rib cage, respiration is clear at this time. VS BP 180 palpable, HR 72, respiration 26, SPO2 96% RA, CBG 96.

## 2015-08-17 NOTE — H&P (Signed)
History   Steven [name] is an 65 y.o. male.   Chief Complaint:  Chief Complaint  Patient presents with  . Fall  Pt fell off desk replacing ceiling tile.  Complains of left chest wall pain  And left elbow pain.  Denies abdominal pain,  Back pain or neck pain.    Fall This is a new problem. The current episode started today. The problem has been unchanged. Associated symptoms include chest pain. The treatment provided moderate relief.    Past Medical History  Diagnosis Date  . Hypertension   . Arthritis   . Cancer [facility])     prostate s/p radiation Mar 2013  . History of radiation therapy 08/22/11-10/13/11    prostate  . Chest pain   . Hyperlipemia     Past Surgical History  Procedure Laterality Date  . Kidney surgery  1982  . Colonoscopy N/A 12/19/2012    [id number] 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 Normal appearing colon    Family History  Problem Relation Age of Onset  . Colon cancer Neg Hx    Social History:  reports that he quit smoking about 4 years ago. His smoking use included Cigarettes. He has a 20 pack-year smoking history. He does not have any smokeless tobacco history on file. He reports that he does not drink alcohol or use illicit drugs.  Allergies  No Known Allergies  Home Medications   (Not in a hospital admission)  Trauma Course   Results for orders placed or performed during the hospital encounter of 08/17/15 (from the past 48 hour(s))  CBC with Differential     Status: None   Collection Time: 08/17/15  8:22 PM  Result Value Ref Range   WBC 7.9 4.0 - 10.5 K/uL   RBC 5.17 4.22 - 5.81 MIL/uL   Hemoglobin 15.1 13.0 - 17.0 g/dL   HCT 45.0 39.0 - 52.0 %   MCV 87.0 78.0 - 100.0 fL   MCH 29.2 26.0 - 34.0 pg   MCHC 33.6 30.0 - 36.0 g/dL   RDW 12.9 11.5 - 15.5 %   Platelets 226 150 - 400 K/uL   Neutrophils Relative % 73 %   Neutro Abs 5.7 1.7 - 7.7 K/uL   Lymphocytes Relative 22 %   Lymphs Abs 1.7 0.7 - 4.0 K/uL   Monocytes Relative 4 %   Monocytes Absolute 0.3 0.1 - 1.0 K/uL   Eosinophils Relative 1 %   Eosinophils Absolute 0.1 0.0 - 0.7 K/uL   Basophils Relative 0 %   Basophils Absolute 0.0 0.0 - 0.1 K/uL  Comprehensive metabolic panel     Status: Abnormal   Collection Time: 08/17/15  8:22 PM  Result Value Ref Range   Sodium 144 135 - 145 mmol/L   Potassium 3.2 (L) 3.5 - 5.1 mmol/L   Chloride 104 101 - 111 mmol/L   CO2 29 22 - 32 mmol/L   Glucose, Bld 117 (H) 65 - 99 mg/dL   BUN 10 6 - 20 mg/dL   Creatinine, Ser 1.05 0.61 - 1.24 mg/dL   Calcium 9.8 8.9 - 10.3 mg/dL   Total Protein 7.6 6.5 - 8.1 g/dL   Albumin 4.3 3.5 - 5.0 g/dL   AST 25 15 - 41 U/L   ALT 23 17 - 63 U/L   Alkaline Phosphatase 68 38 - 126 U/L   Total Bilirubin 0.6 0.3 - 1.2 mg/dL   GFR calc non Af Amer >  60 >60 mL/min   GFR calc Af Amer >60 >60 mL/min    Comment: (NOTE) The eGFR has been calculated using the CKD EPI equation. This calculation has not been validated in all clinical situations. eGFR's persistently <60 mL/min signify possible Chronic Kidney Disease.    Anion gap 11 5 - 15  I-Stat Chem 8, ED     Status: Abnormal   Collection Time: 08/17/15  8:24 PM  Result Value Ref Range   Sodium 144 135 - 145 mmol/L   Potassium 3.1 (L) 3.5 - 5.1 mmol/L   Chloride 100 (L) 101 - 111 mmol/L   BUN 12 6 - 20 mg/dL   Creatinine, Ser 1.00 0.61 - 1.24 mg/dL   Glucose, Bld 111 (H) 65 - 99 mg/dL   Calcium, Ion 1.18 1.13 - 1.30 mmol/L   TCO2 30 0 - 100 mmol/L   Hemoglobin 16.0 13.0 - 17.0 g/dL   HCT 47.0 39.0 - 52.0 %   Dg Chest 1 View  08/17/2015  CLINICAL DATA:  Status post fall from a table today with a blow to the left side. Pain. Initial encounter. EXAM: CHEST 1 VIEW COMPARISON:  PA and lateral chest 11/23/2014. FINDINGS: The lungs are clear. Heart size is normal. No pneumothorax or pleural effusion. No focal bony abnormality. Thoracic spondylosis noted. IMPRESSION: No acute disease.  Electronically Signed   By: Inge Rise M.D.   On: 08/17/2015 21:15   Dg Elbow Complete Left  08/17/2015  CLINICAL DATA:  Status post fall while standing on computer table. Left elbow pain, with laceration over the olecranon process. Initial encounter. EXAM: LEFT ELBOW - COMPLETE 3+ VIEW COMPARISON:  None. FINDINGS: There appears to be a minimally displaced fracture of the enthesophyte arising at the olecranon, with mild overlying soft swelling. Scattered soft tissue air reflects the associated laceration. The olecranon itself appears grossly intact. The visualized joint spaces are preserved. No significant joint effusion is identified. The soft tissues are unremarkable in appearance. IMPRESSION: Apparent minimally displaced fracture of the enthesophyte arising at the olecranon, with mild overlying soft tissue swelling. Scattered soft tissue air reflects the associated laceration. The olecranon itself appears grossly intact. Electronically Signed   By: Garald Balding M.D.   On: 08/17/2015 21:15   Ct Head Wo Contrast  08/17/2015  CLINICAL DATA:  65 year old male with acute head and cervical spine injury today with pain following fall. Initial encounter. EXAM: CT HEAD WITHOUT CONTRAST CT CERVICAL SPINE WITHOUT CONTRAST TECHNIQUE: Multidetector CT imaging of the head and cervical spine was performed following the standard protocol without intravenous contrast. Multiplanar CT image reconstructions of the cervical spine were also generated. COMPARISON:  None. FINDINGS: CT HEAD FINDINGS Mild generalized cerebral volume loss noted. No acute intracranial abnormalities are identified, including mass lesion or mass effect, hydrocephalus, extra-axial fluid collection, midline shift, hemorrhage, or acute infarction. The visualized bony calvarium is unremarkable. CT CERVICAL SPINE FINDINGS There is no evidence of acute fracture, subluxation or prevertebral soft tissue swelling. Mild degenerative disc disease and  spondylosis at C5-6 and C6-7 identified. The soft tissue structures are unremarkable. The lung apices are clear. IMPRESSION: No evidence of acute intracranial abnormality. No static evidence of acute injury to the cervical spine. Mild degenerative changes at C5-6 and C6-7. Electronically Signed   By: Margarette Canada M.D.   On: 08/17/2015 21:55   Ct Chest W Contrast  08/17/2015  CLINICAL DATA:  Patient fell out of a chair today. Pain in the left side of the abdomen  and in the ribs. EXAM: CT CHEST, ABDOMEN, AND PELVIS WITH CONTRAST TECHNIQUE: Multidetector CT imaging of the chest, abdomen and pelvis was performed following the standard protocol during bolus administration of intravenous contrast. CONTRAST:  161m OMNIPAQUE IOHEXOL 300 MG/ML  SOLN COMPARISON:  None. FINDINGS: CT CHEST FINDINGS Mediastinum/Lymph Nodes: No masses, pathologically enlarged lymph nodes, or other significant abnormality. Lungs/Pleura: Atelectasis in the lung bases. No focal consolidation. No pneumothorax. No pleural effusions. Musculoskeletal: Thoracolumbar scoliosis convex towards the right. Mild degenerative changes in the spine. Sternum appears intact. Nondisplaced fractures of the left posterior tenth, ninth, eighth, and seventh ribs. CT ABDOMEN PELVIS FINDINGS Hepatobiliary: Scattered sub cm low-attenuation lesions in the liver are too small to characterize but likely represent small cysts or hemangiomas. Cholelithiasis without inflammatory changes. No bile duct dilatation. Pancreas: No mass, inflammatory changes, or other significant abnormality. Spleen: Within normal limits in size and appearance. Adrenals/Urinary Tract: No masses identified. No evidence of hydronephrosis. Stomach/Bowel: No evidence of obstruction, inflammatory process, or abnormal fluid collections. Vascular/Lymphatic: No pathologically enlarged lymph nodes. No evidence of abdominal aortic aneurysm. Reproductive: No mass or other significant abnormality. Other: No  free air.  Abdominal wall musculature appears intact. Musculoskeletal:  No suspicious bone lesions identified. IMPRESSION: Multiple nondisplaced left rib fractures. Atelectasis in the left lung base. No evidence of acute mediastinal injury. No acute posttraumatic changes demonstrated in the abdomen or pelvis. No evidence of solid organ injury or bowel perforation. Nonspecific low-attenuation lesions in the liver are likely represent small cysts or hemangiomas. Electronically Signed   By: WLucienne CapersM.D.   On: 08/17/2015 22:06   Ct Cervical Spine Wo Contrast  08/17/2015  CLINICAL DATA:  65year old male with acute head and cervical spine injury today with pain following fall. Initial encounter. EXAM: CT HEAD WITHOUT CONTRAST CT CERVICAL SPINE WITHOUT CONTRAST TECHNIQUE: Multidetector CT imaging of the head and cervical spine was performed following the standard protocol without intravenous contrast. Multiplanar CT image reconstructions of the cervical spine were also generated. COMPARISON:  None. FINDINGS: CT HEAD FINDINGS Mild generalized cerebral volume loss noted. No acute intracranial abnormalities are identified, including mass lesion or mass effect, hydrocephalus, extra-axial fluid collection, midline shift, hemorrhage, or acute infarction. The visualized bony calvarium is unremarkable. CT CERVICAL SPINE FINDINGS There is no evidence of acute fracture, subluxation or prevertebral soft tissue swelling. Mild degenerative disc disease and spondylosis at C5-6 and C6-7 identified. The soft tissue structures are unremarkable. The lung apices are clear. IMPRESSION: No evidence of acute intracranial abnormality. No static evidence of acute injury to the cervical spine. Mild degenerative changes at C5-6 and C6-7. Electronically Signed   By: JMargarette CanadaM.D.   On: 08/17/2015 21:55   Ct Abdomen Pelvis W Contrast  08/17/2015  CLINICAL DATA:  Patient fell out of a chair today. Pain in the left side of the  abdomen and in the ribs. EXAM: CT CHEST, ABDOMEN, AND PELVIS WITH CONTRAST TECHNIQUE: Multidetector CT imaging of the chest, abdomen and pelvis was performed following the standard protocol during bolus administration of intravenous contrast. CONTRAST:  1071mOMNIPAQUE IOHEXOL 300 MG/ML  SOLN COMPARISON:  None. FINDINGS: CT CHEST FINDINGS Mediastinum/Lymph Nodes: No masses, pathologically enlarged lymph nodes, or other significant abnormality. Lungs/Pleura: Atelectasis in the lung bases. No focal consolidation. No pneumothorax. No pleural effusions. Musculoskeletal: Thoracolumbar scoliosis convex towards the right. Mild degenerative changes in the spine. Sternum appears intact. Nondisplaced fractures of the left posterior tenth, ninth, eighth, and seventh ribs. CT ABDOMEN PELVIS FINDINGS Hepatobiliary:  Scattered sub cm low-attenuation lesions in the liver are too small to characterize but likely represent small cysts or hemangiomas. Cholelithiasis without inflammatory changes. No bile duct dilatation. Pancreas: No mass, inflammatory changes, or other significant abnormality. Spleen: Within normal limits in size and appearance. Adrenals/Urinary Tract: No masses identified. No evidence of hydronephrosis. Stomach/Bowel: No evidence of obstruction, inflammatory process, or abnormal fluid collections. Vascular/Lymphatic: No pathologically enlarged lymph nodes. No evidence of abdominal aortic aneurysm. Reproductive: No mass or other significant abnormality. Other: No free air.  Abdominal wall musculature appears intact. Musculoskeletal:  No suspicious bone lesions identified. IMPRESSION: Multiple nondisplaced left rib fractures. Atelectasis in the left lung base. No evidence of acute mediastinal injury. No acute posttraumatic changes demonstrated in the abdomen or pelvis. No evidence of solid organ injury or bowel perforation. Nonspecific low-attenuation lesions in the liver are likely represent small cysts or  hemangiomas. Electronically Signed   By: Lucienne Capers M.D.   On: 08/17/2015 22:06    Review of Systems  Constitutional: Negative.   HENT: Negative.   Eyes: Negative.   Respiratory: Negative.   Cardiovascular: Positive for chest pain.  Gastrointestinal: Negative.   Genitourinary: Negative.   Musculoskeletal: Positive for falls.  Skin: Negative.   Neurological: Positive for dizziness and loss of consciousness.  Endo/Heme/Allergies: Does not bruise/bleed easily.  Psychiatric/Behavioral: Negative.     Blood pressure 132/77, pulse 73, temperature 97.9 F (36.6 C), temperature source Oral, resp. rate 16, height 5' 6"  (1.676 m), weight 100.245 kg (221 lb), SpO2 96 %. Physical Exam  Constitutional: He appears well-developed and well-nourished.  HENT:  Head: Normocephalic.  Hematoma left side of scalp  Eyes: EOM are normal. Pupils are equal, round, and reactive to light.  Neck: Normal range of motion and full passive range of motion without pain. Neck supple. No spinous process tenderness and no muscular tenderness present.  Cardiovascular: Normal rate, regular rhythm and intact distal pulses.   Pulses:      Radial pulses are 3+ on the right side, and 3+ on the left side.  Respiratory: Effort normal and breath sounds normal. He exhibits tenderness.  GI: Soft. Bowel sounds are normal. He exhibits mass. He exhibits no distension. There is no tenderness. There is no rebound and no guarding.  Musculoskeletal:       Arms: Neurological: He is alert. He has normal strength. He is not disoriented. No cranial nerve deficit or sensory deficit. GCS eye subscore is 4. GCS verbal subscore is 5. GCS motor subscore is 6.  Skin: Skin is warm and dry.     Assessment/Plan Multiple left rib fractures  Admit for pain control and pulmonary toilet Possible open left elbow fracture  EDP has called ortho to evaluate  Tiffny Gemmer A. 08/17/2015, 11:18 PM   Procedures

## 2015-08-17 NOTE — ED Provider Notes (Signed)
I saw and evaluated the patient, reviewed the resident's note and I agree with the findings and plan.   EKG Interpretation None      65 year old male presenting after mechanical fall off a desk onto his left side while trying to fix some ceiling tiles. Imaging significant for an enthesophyte fracture at olecranon. There is an associated laceration overlying it. Suspect may be open to it, but not sure if this would actually need formal washout. Will discuss with orthopedics. Multiple L sided rib factures. No pneumothorax. No hx of underlying lung diease, but former smoker with 20 pack year smoking history. Spleen ok. Has remained HD stable. H/H normal. No blood thinners. Rest of imaging ok. Will discuss with trauma.   Dg Chest 1 View  08/17/2015  CLINICAL DATA:  Status post fall from a table today with a blow to the left side. Pain. Initial encounter. EXAM: CHEST 1 VIEW COMPARISON:  PA and lateral chest 11/23/2014. FINDINGS: The lungs are clear. Heart size is normal. No pneumothorax or pleural effusion. No focal bony abnormality. Thoracic spondylosis noted. IMPRESSION: No acute disease. Electronically Signed   By: Inge Rise M.D.   On: 08/17/2015 21:15   Dg Elbow Complete Left  08/17/2015  CLINICAL DATA:  Status post fall while standing on computer table. Left elbow pain, with laceration over the olecranon process. Initial encounter. EXAM: LEFT ELBOW - COMPLETE 3+ VIEW COMPARISON:  None. FINDINGS: There appears to be a minimally displaced fracture of the enthesophyte arising at the olecranon, with mild overlying soft swelling. Scattered soft tissue air reflects the associated laceration. The olecranon itself appears grossly intact. The visualized joint spaces are preserved. No significant joint effusion is identified. The soft tissues are unremarkable in appearance. IMPRESSION: Apparent minimally displaced fracture of the enthesophyte arising at the olecranon, with mild overlying soft tissue  swelling. Scattered soft tissue air reflects the associated laceration. The olecranon itself appears grossly intact. Electronically Signed   By: Garald Balding M.D.   On: 08/17/2015 21:15   Ct Head Wo Contrast  08/17/2015  CLINICAL DATA:  65 year old male with acute head and cervical spine injury today with pain following fall. Initial encounter. EXAM: CT HEAD WITHOUT CONTRAST CT CERVICAL SPINE WITHOUT CONTRAST TECHNIQUE: Multidetector CT imaging of the head and cervical spine was performed following the standard protocol without intravenous contrast. Multiplanar CT image reconstructions of the cervical spine were also generated. COMPARISON:  None. FINDINGS: CT HEAD FINDINGS Mild generalized cerebral volume loss noted. No acute intracranial abnormalities are identified, including mass lesion or mass effect, hydrocephalus, extra-axial fluid collection, midline shift, hemorrhage, or acute infarction. The visualized bony calvarium is unremarkable. CT CERVICAL SPINE FINDINGS There is no evidence of acute fracture, subluxation or prevertebral soft tissue swelling. Mild degenerative disc disease and spondylosis at C5-6 and C6-7 identified. The soft tissue structures are unremarkable. The lung apices are clear. IMPRESSION: No evidence of acute intracranial abnormality. No static evidence of acute injury to the cervical spine. Mild degenerative changes at C5-6 and C6-7. Electronically Signed   By: Margarette Canada M.D.   On: 08/17/2015 21:55   Ct Chest W Contrast  08/17/2015  CLINICAL DATA:  Patient fell out of a chair today. Pain in the left side of the abdomen and in the ribs. EXAM: CT CHEST, ABDOMEN, AND PELVIS WITH CONTRAST TECHNIQUE: Multidetector CT imaging of the chest, abdomen and pelvis was performed following the standard protocol during bolus administration of intravenous contrast. CONTRAST:  173mL OMNIPAQUE IOHEXOL 300 MG/ML  SOLN COMPARISON:  None. FINDINGS: CT CHEST FINDINGS Mediastinum/Lymph Nodes: No  masses, pathologically enlarged lymph nodes, or other significant abnormality. Lungs/Pleura: Atelectasis in the lung bases. No focal consolidation. No pneumothorax. No pleural effusions. Musculoskeletal: Thoracolumbar scoliosis convex towards the right. Mild degenerative changes in the spine. Sternum appears intact. Nondisplaced fractures of the left posterior tenth, ninth, eighth, and seventh ribs. CT ABDOMEN PELVIS FINDINGS Hepatobiliary: Scattered sub cm low-attenuation lesions in the liver are too small to characterize but likely represent small cysts or hemangiomas. Cholelithiasis without inflammatory changes. No bile duct dilatation. Pancreas: No mass, inflammatory changes, or other significant abnormality. Spleen: Within normal limits in size and appearance. Adrenals/Urinary Tract: No masses identified. No evidence of hydronephrosis. Stomach/Bowel: No evidence of obstruction, inflammatory process, or abnormal fluid collections. Vascular/Lymphatic: No pathologically enlarged lymph nodes. No evidence of abdominal aortic aneurysm. Reproductive: No mass or other significant abnormality. Other: No free air.  Abdominal wall musculature appears intact. Musculoskeletal:  No suspicious bone lesions identified. IMPRESSION: Multiple nondisplaced left rib fractures. Atelectasis in the left lung base. No evidence of acute mediastinal injury. No acute posttraumatic changes demonstrated in the abdomen or pelvis. No evidence of solid organ injury or bowel perforation. Nonspecific low-attenuation lesions in the liver are likely represent small cysts or hemangiomas. Electronically Signed   By: Lucienne Capers M.D.   On: 08/17/2015 22:06   Ct Cervical Spine Wo Contrast  08/17/2015  CLINICAL DATA:  65 year old male with acute head and cervical spine injury today with pain following fall. Initial encounter. EXAM: CT HEAD WITHOUT CONTRAST CT CERVICAL SPINE WITHOUT CONTRAST TECHNIQUE: Multidetector CT imaging of the head and  cervical spine was performed following the standard protocol without intravenous contrast. Multiplanar CT image reconstructions of the cervical spine were also generated. COMPARISON:  None. FINDINGS: CT HEAD FINDINGS Mild generalized cerebral volume loss noted. No acute intracranial abnormalities are identified, including mass lesion or mass effect, hydrocephalus, extra-axial fluid collection, midline shift, hemorrhage, or acute infarction. The visualized bony calvarium is unremarkable. CT CERVICAL SPINE FINDINGS There is no evidence of acute fracture, subluxation or prevertebral soft tissue swelling. Mild degenerative disc disease and spondylosis at C5-6 and C6-7 identified. The soft tissue structures are unremarkable. The lung apices are clear. IMPRESSION: No evidence of acute intracranial abnormality. No static evidence of acute injury to the cervical spine. Mild degenerative changes at C5-6 and C6-7. Electronically Signed   By: Margarette Canada M.D.   On: 08/17/2015 21:55   Ct Abdomen Pelvis W Contrast  08/17/2015  CLINICAL DATA:  Patient fell out of a chair today. Pain in the left side of the abdomen and in the ribs. EXAM: CT CHEST, ABDOMEN, AND PELVIS WITH CONTRAST TECHNIQUE: Multidetector CT imaging of the chest, abdomen and pelvis was performed following the standard protocol during bolus administration of intravenous contrast. CONTRAST:  143mL OMNIPAQUE IOHEXOL 300 MG/ML  SOLN COMPARISON:  None. FINDINGS: CT CHEST FINDINGS Mediastinum/Lymph Nodes: No masses, pathologically enlarged lymph nodes, or other significant abnormality. Lungs/Pleura: Atelectasis in the lung bases. No focal consolidation. No pneumothorax. No pleural effusions. Musculoskeletal: Thoracolumbar scoliosis convex towards the right. Mild degenerative changes in the spine. Sternum appears intact. Nondisplaced fractures of the left posterior tenth, ninth, eighth, and seventh ribs. CT ABDOMEN PELVIS FINDINGS Hepatobiliary: Scattered sub cm  low-attenuation lesions in the liver are too small to characterize but likely represent small cysts or hemangiomas. Cholelithiasis without inflammatory changes. No bile duct dilatation. Pancreas: No mass, inflammatory changes, or other significant abnormality. Spleen: Within normal limits  in size and appearance. Adrenals/Urinary Tract: No masses identified. No evidence of hydronephrosis. Stomach/Bowel: No evidence of obstruction, inflammatory process, or abnormal fluid collections. Vascular/Lymphatic: No pathologically enlarged lymph nodes. No evidence of abdominal aortic aneurysm. Reproductive: No mass or other significant abnormality. Other: No free air.  Abdominal wall musculature appears intact. Musculoskeletal:  No suspicious bone lesions identified. IMPRESSION: Multiple nondisplaced left rib fractures. Atelectasis in the left lung base. No evidence of acute mediastinal injury. No acute posttraumatic changes demonstrated in the abdomen or pelvis. No evidence of solid organ injury or bowel perforation. Nonspecific low-attenuation lesions in the liver are likely represent small cysts or hemangiomas. Electronically Signed   By: Lucienne Capers M.D.   On: 08/17/2015 22:06    Virgel Manifold, MD 08/17/15 [id number]

## 2015-08-17 NOTE — ED Notes (Signed)
EDP suturing elbow laceration on his room.

## 2015-08-17 NOTE — ED Notes (Signed)
Patient transported to X-ray 

## 2015-08-17 NOTE — ED Provider Notes (Signed)
CSN: [id number]     Arrival date & time 08/17/15  1947 History   First MD Initiated Contact with Patient 08/17/15 1948     Chief Complaint  Patient presents with  . Fall     (Consider location/radiation/quality/duration/timing/severity/associated sxs/prior Treatment) Patient is a 65 y.o. male presenting with fall.  Fall Associated symptoms include chest pain. Pertinent negatives include no abdominal pain, chills, coughing, fever, headaches, nausea, rash or vomiting.  65 year old male with no significant past medical history was standing on top of a desk that wheels on it when he fell off, landing on his left side.  no LOC, is having severe left-sided chest wall pain that is sharp, non-radiating, worse with taking deep breath or movement as well as left elbow pain working this is a small cut.  Past Medical History  Diagnosis Date  . Hypertension   . Arthritis   . Cancer [facility])     prostate s/p radiation Mar 2013  . History of radiation therapy 08/22/11-10/13/11    prostate  . Chest pain   . Hyperlipemia    Past Surgical History  Procedure Laterality Date  . Kidney surgery  1982  . 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 Normal appearing colon   Family History  Problem Relation Age of Onset  . Colon cancer Neg Hx    Social History  Substance Use Topics  . Smoking status: Former Smoker -- 1.00 packs/day for 20 years    Types: Cigarettes    Quit date: 07/13/2011  . Smokeless tobacco: None     Comment: Quit smoking x 2 years  . Alcohol Use: No     Comment: rarely    Review of Systems  Constitutional: Negative for fever and chills.  Eyes: Negative for redness.  Respiratory: Negative for cough and shortness of breath.   Cardiovascular: Positive for chest pain.  Gastrointestinal: Negative for nausea, vomiting, abdominal pain and diarrhea.  Genitourinary: Negative for dysuria.   Skin: Negative for rash.  Neurological: Negative for headaches.  All other systems reviewed and are negative.     Allergies  Review of patient's allergies indicates no known allergies.  Home Medications   Prior to Admission medications   Medication Sig Start Date End Date Taking? Authorizing Provider  amLODipine (NORVASC) 10 MG tablet Take 10 mg by mouth daily.   Yes Historical Provider, MD  aspirin 81 MG tablet Take 81 mg by mouth daily.   Yes Historical Provider, MD  hydrochlorothiazide (HYDRODIURIL) 25 MG tablet Take 25 mg by mouth daily.   Yes Historical Provider, MD  ibuprofen (ADVIL,MOTRIN) 200 MG tablet Take 200 mg by mouth every 6 (six) hours as needed for headache, mild pain or moderate pain.   Yes Historical Provider, MD  Potassium (POTASSIMIN PO) Take 1 tablet by mouth daily.   Yes Historical Provider, MD  VITAMIN E PO Take 1 tablet by mouth daily.   Yes Historical Provider, MD  mesalamine (CANASA) 1000 MG suppository Place 1 suppository (1,000 mg total) rectally every other day. Patient not taking: Reported on 08/17/2015 03/19/13   Mahala Menghini, PA-C   BP 133/118 mmHg  Pulse 79  Temp(Src) 97.9 F (36.6 C) (Oral)  Resp 16  Ht 5\' 6"  (1.676 m)  Wt 100.245 kg  BMI 35.69 kg/m2  SpO2 96% Physical Exam  Constitutional: He is oriented to person, place, and time. No distress.  HENT:  Head: Normocephalic  and atraumatic.  Eyes: EOM are normal. Pupils are equal, round, and reactive to light.  Neck: Normal range of motion. Neck supple.  Cardiovascular: Normal rate.   Pulmonary/Chest: Effort normal. No respiratory distress. He exhibits tenderness (L).  Abdominal: Soft. There is no tenderness.  Musculoskeletal: Normal range of motion.  1.5 cm lac L elbow.  L hand nvi  Neurological: He is alert and oriented to person, place, and time.  Skin: No rash noted. He is not diaphoretic.  Psychiatric: He has a normal mood and affect.    ED Course  .Marland KitchenLaceration Repair Date/Time:  08/18/2015 1:03 AM Performed by: Jarome Matin Authorized by: Jarome Matin Consent: Verbal consent obtained. Risks and benefits: risks, benefits and alternatives were discussed Consent given by: patient Patient identity confirmed: verbally with patient Body area: upper extremity Location details: left elbow Laceration length: 1.5 cm Foreign bodies: no foreign bodies Tendon involvement: none Nerve involvement: none Anesthesia: local infiltration Local anesthetic: lidocaine 1% without epinephrine Anesthetic total: 3 ml Irrigation solution: saline Irrigation method: jet lavage Amount of cleaning: standard Debridement: none Degree of undermining: none Number of sutures: 4 Comments: Closed with vicryl rapide   (including critical care time) Labs Review Labs Reviewed  COMPREHENSIVE METABOLIC PANEL - Abnormal; Notable for the following:    Potassium 3.2 (*)    Glucose, Bld 117 (*)    All other components within normal limits  I-STAT CHEM 8, ED - Abnormal; Notable for the following:    Potassium 3.1 (*)    Chloride 100 (*)    Glucose, Bld 111 (*)    All other components within normal limits  CBC WITH DIFFERENTIAL/PLATELET  CBC  CREATININE, SERUM    Imaging Review Dg Chest 1 View  08/17/2015  CLINICAL DATA:  Status post fall from a table today with a blow to the left side. Pain. Initial encounter. EXAM: CHEST 1 VIEW COMPARISON:  PA and lateral chest 11/23/2014. FINDINGS: The lungs are clear. Heart size is normal. No pneumothorax or pleural effusion. No focal bony abnormality. Thoracic spondylosis noted. IMPRESSION: No acute disease. Electronically Signed   By: Inge Rise M.D.   On: 08/17/2015 21:15   Dg Elbow Complete Left  08/17/2015  CLINICAL DATA:  Status post fall while standing on computer table. Left elbow pain, with laceration over the olecranon process. Initial encounter. EXAM: LEFT ELBOW - COMPLETE 3+ VIEW COMPARISON:  None. FINDINGS: There appears to be a minimally  displaced fracture of the enthesophyte arising at the olecranon, with mild overlying soft swelling. Scattered soft tissue air reflects the associated laceration. The olecranon itself appears grossly intact. The visualized joint spaces are preserved. No significant joint effusion is identified. The soft tissues are unremarkable in appearance. IMPRESSION: Apparent minimally displaced fracture of the enthesophyte arising at the olecranon, with mild overlying soft tissue swelling. Scattered soft tissue air reflects the associated laceration. The olecranon itself appears grossly intact. Electronically Signed   By: Garald Balding M.D.   On: 08/17/2015 21:15   Ct Head Wo Contrast  08/17/2015  CLINICAL DATA:  65 year old male with acute head and cervical spine injury today with pain following fall. Initial encounter. EXAM: CT HEAD WITHOUT CONTRAST CT CERVICAL SPINE WITHOUT CONTRAST TECHNIQUE: Multidetector CT imaging of the head and cervical spine was performed following the standard protocol without intravenous contrast. Multiplanar CT image reconstructions of the cervical spine were also generated. COMPARISON:  None. FINDINGS: CT HEAD FINDINGS Mild generalized cerebral volume loss noted. No acute intracranial abnormalities are identified, including  mass lesion or mass effect, hydrocephalus, extra-axial fluid collection, midline shift, hemorrhage, or acute infarction. The visualized bony calvarium is unremarkable. CT CERVICAL SPINE FINDINGS There is no evidence of acute fracture, subluxation or prevertebral soft tissue swelling. Mild degenerative disc disease and spondylosis at C5-6 and C6-7 identified. The soft tissue structures are unremarkable. The lung apices are clear. IMPRESSION: No evidence of acute intracranial abnormality. No static evidence of acute injury to the cervical spine. Mild degenerative changes at C5-6 and C6-7. Electronically Signed   By: Margarette Canada M.D.   On: 08/17/2015 21:55   Ct Chest W  Contrast  08/17/2015  CLINICAL DATA:  Patient fell out of a chair today. Pain in the left side of the abdomen and in the ribs. EXAM: CT CHEST, ABDOMEN, AND PELVIS WITH CONTRAST TECHNIQUE: Multidetector CT imaging of the chest, abdomen and pelvis was performed following the standard protocol during bolus administration of intravenous contrast. CONTRAST:  157mL OMNIPAQUE IOHEXOL 300 MG/ML  SOLN COMPARISON:  None. FINDINGS: CT CHEST FINDINGS Mediastinum/Lymph Nodes: No masses, pathologically enlarged lymph nodes, or other significant abnormality. Lungs/Pleura: Atelectasis in the lung bases. No focal consolidation. No pneumothorax. No pleural effusions. Musculoskeletal: Thoracolumbar scoliosis convex towards the right. Mild degenerative changes in the spine. Sternum appears intact. Nondisplaced fractures of the left posterior tenth, ninth, eighth, and seventh ribs. CT ABDOMEN PELVIS FINDINGS Hepatobiliary: Scattered sub cm low-attenuation lesions in the liver are too small to characterize but likely represent small cysts or hemangiomas. Cholelithiasis without inflammatory changes. No bile duct dilatation. Pancreas: No mass, inflammatory changes, or other significant abnormality. Spleen: Within normal limits in size and appearance. Adrenals/Urinary Tract: No masses identified. No evidence of hydronephrosis. Stomach/Bowel: No evidence of obstruction, inflammatory process, or abnormal fluid collections. Vascular/Lymphatic: No pathologically enlarged lymph nodes. No evidence of abdominal aortic aneurysm. Reproductive: No mass or other significant abnormality. Other: No free air.  Abdominal wall musculature appears intact. Musculoskeletal:  No suspicious bone lesions identified. IMPRESSION: Multiple nondisplaced left rib fractures. Atelectasis in the left lung base. No evidence of acute mediastinal injury. No acute posttraumatic changes demonstrated in the abdomen or pelvis. No evidence of solid organ injury or bowel  perforation. Nonspecific low-attenuation lesions in the liver are likely represent small cysts or hemangiomas. Electronically Signed   By: Lucienne Capers M.D.   On: 08/17/2015 22:06   Ct Cervical Spine Wo Contrast  08/17/2015  CLINICAL DATA:  65 year old male with acute head and cervical spine injury today with pain following fall. Initial encounter. EXAM: CT HEAD WITHOUT CONTRAST CT CERVICAL SPINE WITHOUT CONTRAST TECHNIQUE: Multidetector CT imaging of the head and cervical spine was performed following the standard protocol without intravenous contrast. Multiplanar CT image reconstructions of the cervical spine were also generated. COMPARISON:  None. FINDINGS: CT HEAD FINDINGS Mild generalized cerebral volume loss noted. No acute intracranial abnormalities are identified, including mass lesion or mass effect, hydrocephalus, extra-axial fluid collection, midline shift, hemorrhage, or acute infarction. The visualized bony calvarium is unremarkable. CT CERVICAL SPINE FINDINGS There is no evidence of acute fracture, subluxation or prevertebral soft tissue swelling. Mild degenerative disc disease and spondylosis at C5-6 and C6-7 identified. The soft tissue structures are unremarkable. The lung apices are clear. IMPRESSION: No evidence of acute intracranial abnormality. No static evidence of acute injury to the cervical spine. Mild degenerative changes at C5-6 and C6-7. Electronically Signed   By: Margarette Canada M.D.   On: 08/17/2015 21:55   Ct Abdomen Pelvis W Contrast  08/17/2015  CLINICAL DATA:  Patient fell out of a chair today. Pain in the left side of the abdomen and in the ribs. EXAM: CT CHEST, ABDOMEN, AND PELVIS WITH CONTRAST TECHNIQUE: Multidetector CT imaging of the chest, abdomen and pelvis was performed following the standard protocol during bolus administration of intravenous contrast. CONTRAST:  132mL OMNIPAQUE IOHEXOL 300 MG/ML  SOLN COMPARISON:  None. FINDINGS: CT CHEST FINDINGS Mediastinum/Lymph  Nodes: No masses, pathologically enlarged lymph nodes, or other significant abnormality. Lungs/Pleura: Atelectasis in the lung bases. No focal consolidation. No pneumothorax. No pleural effusions. Musculoskeletal: Thoracolumbar scoliosis convex towards the right. Mild degenerative changes in the spine. Sternum appears intact. Nondisplaced fractures of the left posterior tenth, ninth, eighth, and seventh ribs. CT ABDOMEN PELVIS FINDINGS Hepatobiliary: Scattered sub cm low-attenuation lesions in the liver are too small to characterize but likely represent small cysts or hemangiomas. Cholelithiasis without inflammatory changes. No bile duct dilatation. Pancreas: No mass, inflammatory changes, or other significant abnormality. Spleen: Within normal limits in size and appearance. Adrenals/Urinary Tract: No masses identified. No evidence of hydronephrosis. Stomach/Bowel: No evidence of obstruction, inflammatory process, or abnormal fluid collections. Vascular/Lymphatic: No pathologically enlarged lymph nodes. No evidence of abdominal aortic aneurysm. Reproductive: No mass or other significant abnormality. Other: No free air.  Abdominal wall musculature appears intact. Musculoskeletal:  No suspicious bone lesions identified. IMPRESSION: Multiple nondisplaced left rib fractures. Atelectasis in the left lung base. No evidence of acute mediastinal injury. No acute posttraumatic changes demonstrated in the abdomen or pelvis. No evidence of solid organ injury or bowel perforation. Nonspecific low-attenuation lesions in the liver are likely represent small cysts or hemangiomas. Electronically Signed   By: Lucienne Capers M.D.   On: 08/17/2015 22:06   I have personally reviewed and evaluated these images and lab results as part of my medical decision-making.   EKG Interpretation None      MDM   Final diagnoses:  1.  Fall 2.  Multiple rib fractures, left 3.  Skin laceration    65 year old male no significant past  medical history, no history of lung disease, comes in after a mechanical fall. Exam as above obtained plain films of the left elbow will obtain full trauma intervention given his distress from his left-sided chest wall pain. We'll obtain CT head, C-spine, chest abdomen pelvis.  Imaging shows 4 left-sided rib fractures without any associated pneumothorax, he also has a enthesophyte avulsion fracture on his left olecranon appears to possibly be open, discussed with orthopedics feel that given the minor nature of this fracture they recommend bedside irrigation and closure, will irrigate at bedside and primarily close the wound. Given multiple rib fractures and the patient's age we'll consult trauma surgery.  Have closed elbow lac.  Trauma will admit for obs.  Jarome Matin, MD 08/18/15 [id number]  Virgel Manifold, MD 08/25/15 [phone]

## 2015-08-17 NOTE — ED Notes (Signed)
Pt still on XR.

## 2015-08-18 DIAGNOSIS — S42402B Unspecified fracture of lower end of left humerus, initial encounter for open fracture: Secondary | ICD-10-CM | POA: Diagnosis present

## 2015-08-18 DIAGNOSIS — W19XXXA Unspecified fall, initial encounter: Secondary | ICD-10-CM | POA: Diagnosis present

## 2015-08-18 HISTORY — DX: Unspecified fracture of lower end of left humerus, initial encounter for open fracture: S42.402B

## 2015-08-18 LAB — CBC
HCT: 40.1 % (ref 39.0–52.0)
Hemoglobin: 13.4 g/dL (ref 13.0–17.0)
MCH: 29 pg (ref 26.0–34.0)
MCHC: 33.4 g/dL (ref 30.0–36.0)
MCV: 86.8 fL (ref 78.0–100.0)
Platelets: 210 10*3/uL (ref 150–400)
RBC: 4.62 MIL/uL (ref 4.22–5.81)
RDW: 12.9 % (ref 11.5–15.5)
WBC: 8 10*3/uL (ref 4.0–10.5)

## 2015-08-18 LAB — CREATININE, SERUM
Creatinine, Ser: 0.88 mg/dL (ref 0.61–1.24)
GFR calc Af Amer: >60 mL/min (ref >60)
GFR calc non Af Amer: >60 mL/min (ref >60)

## 2015-08-18 MED ORDER — OXYCODONE HCL 5 MG PO TABS
5.0000 mg | ORAL_TABLET | ORAL | Status: DC | PRN
  Administered 2015-08-18: 10 mg via ORAL
  Administered 2015-08-18: 15 mg via ORAL
  Administered 2015-08-18 – 2015-08-19 (×3): 10 mg via ORAL
  Filled 2015-08-18 (×3): qty 2
  Filled 2015-08-18: qty 3
  Filled 2015-08-18: qty 2

## 2015-08-18 MED ORDER — ENOXAPARIN SODIUM 30 MG/0.3ML ~~LOC~~ SOLN
30.0000 mg | Freq: Two times a day (BID) | SUBCUTANEOUS | Status: DC
  Administered 2015-08-18 – 2015-08-19 (×3): 30 mg via SUBCUTANEOUS
  Filled 2015-08-18 (×3): qty 0.3

## 2015-08-18 MED ORDER — DOCUSATE SODIUM 100 MG PO CAPS
100.0000 mg | ORAL_CAPSULE | Freq: Two times a day (BID) | ORAL | Status: DC
  Administered 2015-08-18 – 2015-08-19 (×3): 100 mg via ORAL
  Filled 2015-08-18 (×3): qty 1

## 2015-08-18 MED ORDER — POLYETHYLENE GLYCOL 3350 17 G PO PACK
17.0000 g | PACK | Freq: Every day | ORAL | Status: DC
  Administered 2015-08-18 – 2015-08-19 (×2): 17 g via ORAL
  Filled 2015-08-18 (×2): qty 1

## 2015-08-18 MED ORDER — NAPROXEN 250 MG PO TABS
500.0000 mg | ORAL_TABLET | Freq: Two times a day (BID) | ORAL | Status: DC
  Administered 2015-08-18 – 2015-08-19 (×3): 500 mg via ORAL
  Filled 2015-08-18 (×3): qty 2

## 2015-08-18 MED ORDER — METHOCARBAMOL 750 MG PO TABS
750.0000 mg | ORAL_TABLET | Freq: Three times a day (TID) | ORAL | Status: DC
  Administered 2015-08-18 – 2015-08-19 (×4): 750 mg via ORAL
  Filled 2015-08-18 (×4): qty 1

## 2015-08-18 MED ORDER — HYDROMORPHONE HCL 1 MG/ML IJ SOLN: 0.5000 mg | INTRAMUSCULAR | Status: DC | PRN

## 2015-08-18 MED ORDER — BACITRACIN ZINC 500 UNIT/GM EX OINT
TOPICAL_OINTMENT | Freq: Two times a day (BID) | CUTANEOUS | Status: DC
  Administered 2015-08-18: 1 via TOPICAL
  Administered 2015-08-18: 12:00:00 via TOPICAL
  Administered 2015-08-19: 15.5556 via TOPICAL
  Filled 2015-08-18 (×2): qty 28.35

## 2015-08-18 NOTE — Progress Notes (Signed)
Patient ID: Steven [name], male   DOB: 08-May-1951, 65 y.o.   MRN: [id number]  LOS: 2 days  Subjective: Hurting, nothing unexpected.   Objective: Vital signs in last 24 hours: Temp:  [97.9 F (36.6 C)-99.3 F (37.4 C)] 99.3 F (37.4 C) (01/25 [id number]) Pulse Rate:  [69-84] 74 (01/25 0613) Resp:  [15-20] 18 (01/25 0613) BP: (119-162)/(65-118) 130/67 mmHg (01/25 0613) SpO2:  [95 %-100 %] 98 % (01/25 [id number]) Weight:  [100.245 kg (221 lb)] 100.245 kg (221 lb) (01/24 1956) Last BM Date: 08/17/15   IS: 1080ml   Laboratory  CBC  Recent Labs  08/17/15 2022 08/17/15 2024 08/18/15 0049  WBC 7.9  --  8.0  HGB 15.1 16.0 13.4  HCT 45.0 47.0 40.1  PLT 226  --  210   BMET  Recent Labs  08/17/15 2022 08/17/15 2024 08/18/15 0049  NA 144 144  --   K 3.2* 3.1*  --   CL 104 100*  --   CO2 29  --   --   GLUCOSE 117* 111*  --   BUN 10 12  --   CREATININE 1.05 1.00 0.88  CALCIUM 9.8  --   --     Physical Exam General appearance: alert and no distress Resp: clear to auscultation bilaterally Cardio: regular rate and rhythm   Assessment/Plan: Fall Multiple left rib fxs -- Pulmonary toilet Left elbow fx/laceration s/p I&D, closure -- Local care Multiple medical problems -- Home meds FEN -- Encourage orals for pain VTE -- SCD's, Lovenox Dispo -- PT/OT. Lives with fiancee who works so will need to be mod I to go home.    Lisette Abu, PA-C Pager: [phone] General Trauma PA Pager: [phone]  08/18/2015

## 2015-08-18 NOTE — Evaluation (Signed)
Physical Therapy Evaluation Patient Details Name: Steven [name] MRN: [id number] DOB: 1950/11/08 Today's Date: 08/18/2015   History of Present Illness  65 y.o. male who reports falling off a table at  work and landing on his left side. The patient has been diagnosed with multiple Lt rib fractures and Lt elbow laceration. PMH: HTN, prostate cancer, arthritis.   Clinical Impression  Pt admitted with above diagnosis. Pt currently with functional limitations due to the deficits listed below (see PT Problem List).  Pt will benefit from skilled PT to increase their independence and safety with mobility to allow discharge to the venue listed below.  At this time the patient is requiring min assist with bed mobility and min guard to supervision with transfers. Ambulation was performed initially with hand held assist and min assist X25 feet then X25 feet with rw and supervision. Unable to tolerated attempting stairs at this time. PT to continue to follow to progress mobility. Patient reports that they are trying to arrange for friends/family to be able to check on him while home to provide intermittent supervision.      Follow Up Recommendations No PT follow up;Supervision for mobility/OOB    Equipment Recommendations  Other (comment) (to be determined based on mobility progression. )    Recommendations for Other Services       Precautions / Restrictions Precautions Precautions: Other (comment) Precaution Comments: ribs Restrictions Weight Bearing Restrictions: No      Mobility  Bed Mobility Overal bed mobility: Needs Assistance Bed Mobility: Rolling;Sidelying to Sit Rolling: Supervision Sidelying to sit: Min assist       General bed mobility comments: cues for log roll technique to right.   Transfers Overall transfer level: Needs assistance Equipment used: None;Rolling walker (2 wheeled) Transfers: Sit to/from Stand Sit to Stand: Min guard;Supervision         General transfer  comment: First attempt performed with hand held assist and min physical assistance. Repeated with rw later in session and performed with supervision.   Ambulation/Gait Ambulation/Gait assistance: Supervision;Min guard Ambulation Distance (Feet): 50 Feet (25 feet X2) Assistive device: 1 person hand held assist;Rolling walker (2 wheeled) (first attempt with HHA, second with rw) Gait Pattern/deviations: Step-through pattern;Decreased step length - right;Decreased step length - left Gait velocity: decreased   General Gait Details: guarded pattern with bilateral short strides. Decreasing support as session progressed.   Stairs Stairs:  (not attempted)          Wheelchair Mobility    Modified Rankin (Stroke Patients Only)       Balance Overall balance assessment: Needs assistance Sitting-balance support: Feet supported Sitting balance-Leahy Scale: Good     Standing balance support: No upper extremity supported Standing balance-Leahy Scale: Fair Standing balance comment: static                             Pertinent Vitals/Pain Pain Assessment: 0-10 Pain Score: 6  Pain Location: Lt chest Pain Descriptors / Indicators: Sharp Pain Intervention(s): Limited activity within patient's tolerance;Monitored during session    Home Living Family/patient expects to be discharged to:: Private residence Living Arrangements: Spouse/significant other Available Help at Discharge: Friend(s);Family;Available PRN/intermittently Type of Home: Apartment Home Access: Stairs to enter   Entrance Stairs-Number of Steps: 3 or 6 (3 no rail or 6 with rail) Home Layout: One level Home Equipment: None Additional Comments: Fiance has to work during the day, trying to arrange for friends/family to stop in for  intermittant supervision.     Prior Function Level of Independence: Independent               Hand Dominance        Extremity/Trunk Assessment   Upper Extremity  Assessment:  (limited by chest pain)           Lower Extremity Assessment: Overall WFL for tasks assessed         Communication   Communication: No difficulties  Cognition Arousal/Alertness: Awake/alert Behavior During Therapy: WFL for tasks assessed/performed Overall Cognitive Status: Within Functional Limits for tasks assessed                      General Comments General comments (skin integrity, edema, etc.): Patient with improving mobility as session progress but patient remaining guarded throughout session.    Exercises        Assessment/Plan    PT Assessment Patient needs continued PT services  PT Diagnosis Difficulty walking   PT Problem List Decreased activity tolerance;Decreased mobility;Pain  PT Treatment Interventions DME instruction;Gait training;Stair training;Functional mobility training;Therapeutic activities;Therapeutic exercise;Patient/family education   PT Goals (Current goals can be found in the Care Plan section) Acute Rehab PT Goals Patient Stated Goal: be able to have less pain and move better PT Goal Formulation: With patient Time For Goal Achievement: 09/01/15 Potential to Achieve Goals: Good    Frequency Min 3X/week   Barriers to discharge Decreased caregiver support      Co-evaluation               End of Session Equipment Utilized During Treatment: Gait belt Activity Tolerance: Patient limited by fatigue Patient left: in chair;with call bell/phone within reach;with family/visitor present Nurse Communication: Mobility status;Precautions         Time: [id number] PT Time Calculation (min) (ACUTE ONLY): 44 min   Charges:   PT Evaluation $PT Eval Moderate Complexity: 1 Procedure PT Treatments $Gait Training: 8-22 mins $Therapeutic Activity: 8-22 mins   PT G Codes:        Cassell Clement, PT, CSCS Pager [phone] Office [phone]  08/18/2015, 4:09 PM

## 2015-08-19 ENCOUNTER — Encounter (HOSPITAL_COMMUNITY): Payer: Self-pay | Admitting: General Practice

## 2015-08-19 MED ORDER — DOCUSATE SODIUM 100 MG PO CAPS: 100.0000 mg | ORAL_CAPSULE | Freq: Two times a day (BID) | ORAL | Status: DC

## 2015-08-19 MED ORDER — OXYCODONE-ACETAMINOPHEN 7.5-325 MG PO TABS: 1.0000 | ORAL_TABLET | ORAL | Status: DC | PRN

## 2015-08-19 MED ORDER — POLYETHYLENE GLYCOL 3350 17 G PO PACK: 17.0000 g | PACK | Freq: Every day | ORAL | Status: DC

## 2015-08-19 MED ORDER — ONDANSETRON HCL 4 MG PO TABS: 4.0000 mg | ORAL_TABLET | Freq: Four times a day (QID) | ORAL | Status: DC | PRN

## 2015-08-19 MED ORDER — MAGNESIUM CITRATE PO SOLN: 1.0000 | Freq: Every day | ORAL | Status: DC | PRN

## 2015-08-19 MED ORDER — NAPROXEN 500 MG PO TABS
500.0000 mg | ORAL_TABLET | Freq: Two times a day (BID) | ORAL | Status: DC
Start: 2015-08-19 — End: 2017-08-02

## 2015-08-19 MED ORDER — METHOCARBAMOL 500 MG PO TABS: 1000.0000 mg | ORAL_TABLET | Freq: Four times a day (QID) | ORAL | Status: DC | PRN

## 2015-08-19 MED ORDER — METHOCARBAMOL 500 MG PO TABS: 1000.0000 mg | ORAL_TABLET | Freq: Four times a day (QID) | ORAL | Status: DC

## 2015-08-19 NOTE — Progress Notes (Signed)
Physical Therapy Treatment Patient Details Name: Steven [name] MRN: [id number] DOB: 10-11-1950 Today's Date: 08/19/2015    History of Present Illness 65 y.o. male who reports falling off a table at  work and landing on his left side. The patient has been diagnosed with multiple Lt rib fractures and Lt elbow laceration. PMH: HTN, prostate cancer, arthritis.     PT Comments    Pt making excellent progress with mobility; He is able to tol amb ~120' with supervision,  up/down 5 stairs, and pain improving with mobility as well;  Pt will need SPC for home use to improve gait pattern and stability  Follow Up Recommendations  No PT follow up;Supervision for mobility/OOB     Equipment Recommendations  Cane (straight cane)    Recommendations for Other Services       Precautions / Restrictions Restrictions Weight Bearing Restrictions: No    Mobility  Bed Mobility Overal bed mobility: Needs Assistance Bed Mobility: Rolling;Sidelying to Sit Rolling: Supervision Sidelying to sit: Min guard       General bed mobility comments: cues for rolling--pt does only partial roll, HOB lowered to 10*; min/guard to bring trunk to upright, incr time, effortful transition   Transfers Overall transfer level: Needs assistance Equipment used: None;Rolling walker (2 wheeled) Transfers: Sit to/from Stand Sit to Stand: Supervision;Modified independent (Device/Increase time)         General transfer comment: incr time, cues to push with R UE only for better pain control  Ambulation/Gait Ambulation/Gait assistance: Supervision Ambulation Distance (Feet): 120 Feet Assistive device: Rolling walker (2 wheeled);None (rail touch  for ~10') Gait Pattern/deviations: Step-through pattern;Decreased stride length     General Gait Details: initally guarded gait with decr step length bil, improved pattern and incr speed/confidence wtih incr distance; discussed use of cane being similiar to rail touch in  hallway and pt agrees this is appropriate   Stairs Stairs: Yes Stairs assistance: Supervision Stair Management: One rail Right;Step to pattern;Alternating pattern;Forwards;Sideways Number of Stairs: 5 General stair comments: cues for sequence, use of R handrail only   Wheelchair Mobility    Modified Rankin (Stroke Patients Only)       Balance             Standing balance-Leahy Scale: Fair Standing balance comment: pt is limited in reaching out of his BOS by pain             High level balance activites: Side stepping;Backward walking;Direction changes;Turns;Head turns (no LOB with above, close supervision to min/guard)      Cognition Arousal/Alertness: Awake/alert Behavior During Therapy: WFL for tasks assessed/performed Overall Cognitive Status: Within Functional Limits for tasks assessed                      Exercises      General Comments        Pertinent Vitals/Pain Pain Assessment: 0-10 Pain Score: 4  Pain Location: L sided chest Pain Descriptors / Indicators: Grimacing;Sore Pain Intervention(s): Limited activity within patient's tolerance;Monitored during session;Premedicated before session;Repositioned    Home Living                      Prior Function            PT Goals (current goals can now be found in the care plan section) Acute Rehab PT Goals Patient Stated Goal: be able to have less pain and move better PT Goal Formulation: With patient Time For Goal Achievement: 09/01/15  Potential to Achieve Goals: Good Progress towards PT goals: Progressing toward goals    Frequency  Min 3X/week    PT Plan Current plan remains appropriate    Co-evaluation             End of Session   Activity Tolerance: Patient tolerated treatment well Patient left: with call bell/phone within reach;Other (comment) (EOB)     Time: [id number] PT Time Calculation (min) (ACUTE ONLY): 22 min  Charges:  $Gait Training: 8-22  mins                    G Codes:      Steven [name] 09/07/2015, 9:36 AM

## 2015-08-19 NOTE — Care Management Note (Signed)
Case Management Note  Patient Details  Name: Steven [name] MRN: [id number] Date of Birth: 09/11/1950  Subjective/Objective:     Pt admitted on 08/17/15 s/p fall with multiple rib fractures.  PTA, pt independent of ADLS.  PT recommending no OP follow up, DME.                Action/Plan: Ordered cane and 3 in 1 BSC for home use from [facility], to be delivered to pt's room prior to dc.  [employer] recommended, but is not covered by insurance.  Advised to purchase at [facility] or hospital gift shop.   Family members at bedside to assist at dc.    Expected Discharge Date:   08/19/15               Expected Discharge Plan:  Home/Self Care  In-House Referral:     Discharge planning Services  CM Consult  Post Acute Care Choice:    Choice offered to:     DME Arranged:  3-N-1, Cane DME Agency:  [place]:    [place]:     Status of Service:  Completed, signed off  Medicare Important Message Given:    Date Medicare IM Given:    Medicare IM give by:    Date Additional Medicare IM Given:    Additional Medicare Important Message give by:     If discussed at [place] of Stay Meetings, dates discussed:    Additional Comments:  Reinaldo Raddle, RN, BSN  Trauma/Neuro ICU Case Manager [phone]

## 2015-08-19 NOTE — Progress Notes (Signed)
Patient ID: Steven [name], male   DOB: [birth year], 65 y.o.   MRN: [id number]  LOS: 3 days  Subjective: Doing better. Apprehensive about going home.   Objective: Vital signs in last 24 hours: Temp:  [98.1 F (36.7 C)-98.2 F (36.8 C)] 98.1 F (36.7 C) (01/25 2009) Pulse Rate:  [62-65] 62 (01/25 2009) Resp:  [18] 18 (01/25 2009) BP: (121-132)/(70-72) 132/70 mmHg (01/26 0800) SpO2:  [97 %-98 %] 98 % (01/25 2009) Last BM Date: 08/17/15   Physical Exam General appearance: alert and no distress Resp: clear to auscultation bilaterally Cardio: regular rate and rhythm GI: normal findings: bowel sounds normal and soft, non-tender   Assessment/Plan: Fall Multiple left rib fxs -- Pulmonary toilet Left elbow fx/laceration s/p I&D, closure -- Local care Multiple medical problems -- Home meds FEN -- Encourage orals for pain VTE -- SCD's, Lovenox Dispo -- PT/OT. Lives with fiancee who works so will need to be mod I to go home. I suspect that can be this afternoon after PT.    Lisette Abu, PA-C Pager: [phone] General Trauma PA Pager: [phone]  08/19/2015

## 2015-08-19 NOTE — Discharge Instructions (Signed)
No driving while taking oxycodone. ° °Increase activity as pain allows. °

## 2015-08-19 NOTE — Discharge Summary (Signed)
Physician Discharge Summary  Patient ID: Steven [name] MRN: [id number] DOB/AGE: [birth year] 65 y.o.  Admit date: 08/17/2015 Discharge date: 08/19/2015  Discharge Diagnoses Patient Active Problem List   Diagnosis Date Noted  . Fall 08/18/2015  . Open fracture of left elbow 08/18/2015  . Rib fractures 08/17/2015  . Radiation proctitis 01/30/2013  . Rectal bleeding 12/10/2012  . Chest pain 05/30/2012  . Elevated lipids 05/30/2012  . Palpitations 05/30/2012  . Dyspnea 05/30/2012  . Obese 05/30/2012  . Prostate cancer ([place]) 08/13/2011    Consultants None   Procedures 1/24 -- I&D and closure of left elbow laceration by Dr. Jarome Matin   HPI: Saquan was standing on a rolling desk when he slipped and fell, hitting the side of the desk and the floor with his chest. He complained of chest and left elbow pain. His workup included CT scans of the head, cervical spine, chest, abdomen, and pelvis as well as extremity x-rays which showed the above-mentioned injuries. Orthopedic surgery was consulted and just recommended I&D and closure of the elbow wound which was done in the ED. Trauma was asked to admit for pain control and pulmonary toilet.   Hospital Course: The patient's pain was brought under control with oral medications. He was mobilized with physical and occupational therapies and did well. They recommended discharge home with intermittent supervision. He was discharged there in good condition.     Medication List    STOP taking these medications        ibuprofen 200 MG tablet  Commonly known as:  ADVIL,MOTRIN     mesalamine 1000 MG suppository  Commonly known as:  CANASA      TAKE these medications        amLODipine 10 MG tablet  Commonly known as:  NORVASC  Take 10 mg by mouth daily.     aspirin 81 MG tablet  Take 81 mg by mouth daily.     docusate sodium 100 MG capsule  Commonly known as:  COLACE  Take 1 capsule (100 mg total) by mouth 2 (two) times daily.     hydrochlorothiazide 25 MG tablet  Commonly known as:  HYDRODIURIL  Take 25 mg by mouth daily.     magnesium citrate Soln  Take 296 mLs (1 Bottle total) by mouth daily as needed for severe constipation.     methocarbamol 500 MG tablet  Commonly known as:  ROBAXIN  Take 2 tablets (1,000 mg total) by mouth every 6 (six) hours as needed for muscle spasms.     naproxen 500 MG tablet  Commonly known as:  NAPROSYN  Take 1 tablet (500 mg total) by mouth 2 (two) times daily with a meal.     ondansetron 4 MG tablet  Commonly known as:  ZOFRAN  Take 1 tablet (4 mg total) by mouth every 6 (six) hours as needed for nausea.     oxyCODONE-acetaminophen 7.5-325 MG tablet  Commonly known as:  PERCOCET  Take 1-2 tablets by mouth every 4 (four) hours as needed.     polyethylene glycol packet  Commonly known as:  MIRALAX / GLYCOLAX  Take 17 g by mouth daily.     POTASSIMIN PO  Take 1 tablet by mouth daily.     VITAMIN E PO  Take 1 tablet by mouth daily.             Follow-up Information    Call [facility].   Why:  As needed  Contact information:   [address] [id number] [place] [phone]       Signed: Lisette Abu, PA-C Pager: [phone] General Trauma PA Pager: [phone] 08/19/2015, 3:28 PM

## 2015-08-19 NOTE — Progress Notes (Signed)
Occupational Therapy Evaluation/Discharge Patient Details Name: Steven [name] MRN: [id number] DOB: Nov 10, 1950 Today's Date: 08/19/2015    History of Present Illness 65 y.o. male who reports falling off a table at  work and landing on his left side. The patient has been diagnosed with multiple Lt rib fractures and Lt elbow laceration. PMH: HTN, prostate cancer, arthritis.    Clinical Impression   PTA, pt was independent with all ADLs and mobility. Pt completed all functional transfers at supervision level and educated pt on compensatory strategies for LB ADLs due to inability to bend without severe L chest pain. Educated pt on AE Management consultant) use, energy conservation, breathing techniques during pain, and fall prevention strategies as well. Pt plans to d/c home with intermittent assistance from daughter and friends. All education has been completed and pt has no further questions. OT signing off. Thank you for this referral.    Follow Up Recommendations  No OT follow up;Supervision - Intermittent    Equipment Recommendations  3 in 1 bedside comode;Other (comment) (Adaptive equipment - reacher)    Recommendations for Other Services       Precautions / Restrictions Precautions Precautions: Other (comment) Precaution Comments: ribs Restrictions Weight Bearing Restrictions: No      Mobility Bed Mobility Overal bed mobility: Needs Assistance Bed Mobility: Rolling;Sidelying to Sit;Sit to Supine Rolling: Supervision Sidelying to sit: Min guard   Sit to supine: Min guard   General bed mobility comments: HOB flat, no use of bedrails to simulate home environment. Attempted log roll to  exit bed with pt reporting increased L side pain. Pt able to enter bed not using log roll technique with min guard assist and reported less pain.  Transfers Overall transfer level: Needs assistance Equipment used: None Transfers: Sit to/from Stand Sit to Stand: Supervision         General transfer  comment: Supervision for safety. Increased time and effort due to L rib pain. Good demonstration only using RUE to push up from surfaces    Balance Overall balance assessment: Needs assistance Sitting-balance support: No upper extremity supported;Feet supported Sitting balance-Leahy Scale: Good Sitting balance - Comments: Slight limitations due to pain   Standing balance support: No upper extremity supported;During functional activity Standing balance-Leahy Scale: Good Standing balance comment: Maintained balance well without UE support - cannot reach outside of BOS due to pain                            ADL Overall ADL's : Needs assistance/impaired     Grooming: Wash/dry hands;Oral care;Wash/dry face;Modified independent;Standing   Upper Body Bathing: Supervision/ safety;Sitting   Lower Body Bathing: Supervison/ safety;Cueing for compensatory techniques;Sit to/from stand Lower Body Bathing Details (indicate cue type and reason): Able to cross ankle-over-knee to reach LB Upper Body Dressing : Supervision/safety;Sitting   Lower Body Dressing: Supervision/safety;Cueing for compensatory techniques;Sit to/from stand Lower Body Dressing Details (indicate cue type and reason): Able to cross ankle-over-knee to reach LB for socks, shoes, pants, and underwear Toilet Transfer: Supervision/safety;Ambulation;BSC Toilet Transfer Details (indicate cue type and reason): BSC over toilet Toileting- Clothing Manipulation and Hygiene: Supervision/safety;Sit to/from stand   Tub/ Shower Transfer: Tub transfer;Supervision/safety;Ambulation;3 in 1 Tub/Shower Transfer Details (indicate cue type and reason): 3in1 in tub Functional mobility during ADLs: Supervision/safety General ADL Comments: Practiced all bathroom transfers with supervision assist and use of 3in1 over toilet and in shower. Educated/practiced with reacher since pt unable to bend and reach down to retrieve  items due to severe L  rib pain. Educated pt on energy conservation, breathing techniques during pain, and fall prevention strategies.     Vision Vision Assessment?: No apparent visual deficits   Perception     Praxis      Pertinent Vitals/Pain Pain Assessment: 0-10 Pain Score: 7  Pain Location: L ribs Pain Descriptors / Indicators: Aching;Grimacing;Sore Pain Intervention(s): Limited activity within patient's tolerance;Monitored during session;Repositioned;Ice applied     Hand Dominance Right   Extremity/Trunk Assessment Upper Extremity Assessment Upper Extremity Assessment: Overall WFL for tasks assessed (Limited with LUE due to L rib pain)   Lower Extremity Assessment Lower Extremity Assessment: Overall WFL for tasks assessed   Cervical / Trunk Assessment Cervical / Trunk Assessment: Normal   Communication Communication Communication: No difficulties   Cognition Arousal/Alertness: Awake/alert Behavior During Therapy: WFL for tasks assessed/performed Overall Cognitive Status: Within Functional Limits for tasks assessed                     General Comments       Exercises       Shoulder Instructions      Home Living Family/patient expects to be discharged to:: Private residence Living Arrangements: Spouse/significant other Available Help at Discharge: Family;Friend(s);Available PRN/intermittently ("Someone will be there most of the time") Type of Home: Apartment Home Access: Stairs to enter Entrance Stairs-Number of Steps: 3 or 6 Entrance Stairs-Rails: Right Home Layout: One level     Bathroom Shower/Tub: Tub/shower unit;Curtain Shower/tub characteristics: Architectural technologist: Standard Bathroom Accessibility: Yes How Accessible: Accessible via walker Home Equipment: None   Additional Comments: Daughter works from home and will be able to stay during the day for 3-4 days. 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    OT Diagnosis: Acute pain   OT Problem List: Decreased strength;Decreased range of motion;Decreased activity tolerance;Impaired balance (sitting and/or standing);Decreased coordination;Decreased safety awareness;Decreased knowledge of use of DME or AE;Decreased knowledge of precautions;Pain   OT Treatment/Interventions:      OT Goals(Current goals can be found in the care plan section) Acute Rehab OT Goals Patient Stated Goal: be able to have less pain and move better OT Goal Formulation: With patient Time For Goal Achievement: 09/02/15 Potential to Achieve Goals: Good  OT Frequency:     Barriers to D/C:            Co-evaluation              End of Session Equipment Utilized During Treatment: Gait belt Nurse Communication: Mobility status;Precautions  Activity Tolerance: Patient tolerated treatment well Patient left: in bed;with call bell/phone within reach   Time: 1426-1514 OT Time Calculation (min): 48 min Charges:  OT General Charges $OT Visit: 1 Procedure OT Evaluation $OT Eval Moderate Complexity: 1 Procedure OT Treatments $Self Care/Home Management : 23-37 mins G-Codes: OT G-codes **NOT FOR INPATIENT CLASS** Functional Assessment Tool Used: clinical judgement Functional Limitation: Self care Self Care Current Status [id number]): At least 1 percent but less than 20 percent impaired, limited or restricted Self Care Goal Status [id number]): At least 1 percent but less than 20 percent impaired, limited or restricted Self Care Discharge Status [phone]): At least 1 percent but less than 20 percent impaired, limited or restricted  Redmond Baseman, OTR/L Pager: [phone] 08/19/2015, 3:29 PM

## 2015-08-23 ENCOUNTER — Telehealth (HOSPITAL_COMMUNITY): Payer: Self-pay | Admitting: Orthopedic Surgery

## 2015-08-24 NOTE — Telephone Encounter (Signed)
Answered general questions about recovery and return to work. He asked me to mail him copies of his AVS and worker's comp paperwork.

## 2015-08-24 NOTE — Progress Notes (Signed)
Late entry, missed G-code.   08/18/15 1614  PT G-Codes **NOT FOR INPATIENT CLASS**  Functional Assessment Tool Used clinical judgment  Functional Limitation Mobility: Walking and moving around  Mobility: Walking and Moving Around Current Status [id number]) CJ  Mobility: Walking and Moving Around Goal Status [id number]) CI  Cassell Clement, PT, CSCS Pager [phone] Office [phone]

## 2015-09-08 ENCOUNTER — Telehealth (HOSPITAL_COMMUNITY): Payer: Self-pay | Admitting: Orthopedic Surgery

## 2015-09-09 NOTE — Telephone Encounter (Signed)
Gave pt fax number at [facility] to fax paperwork. His main continued c/o is left elbow pain and swelling. I told him that if it persisted into March we should probably have him see someone.

## 2015-10-09 ENCOUNTER — Emergency Department (HOSPITAL_COMMUNITY)
Admission: EM | Admit: 2015-10-09 | Discharge: 2015-10-09 | Disposition: A | Discharge status: Home / Self Care | Payer: BC Managed Care – PPO | Attending: Emergency Medicine | Admitting: Emergency Medicine

## 2015-10-09 ENCOUNTER — Encounter (HOSPITAL_COMMUNITY): Payer: Self-pay

## 2015-10-09 DIAGNOSIS — E876 Hypokalemia: Secondary | ICD-10-CM | POA: Diagnosis not present

## 2015-10-09 DIAGNOSIS — Z87891 Personal history of nicotine dependence: Secondary | ICD-10-CM | POA: Diagnosis not present

## 2015-10-09 DIAGNOSIS — Z8546 Personal history of malignant neoplasm of prostate: Secondary | ICD-10-CM | POA: Insufficient documentation

## 2015-10-09 DIAGNOSIS — I10 Essential (primary) hypertension: Secondary | ICD-10-CM | POA: Insufficient documentation

## 2015-10-09 DIAGNOSIS — R509 Fever, unspecified: Secondary | ICD-10-CM | POA: Diagnosis present

## 2015-10-09 DIAGNOSIS — K529 Noninfective gastroenteritis and colitis, unspecified: Secondary | ICD-10-CM | POA: Insufficient documentation

## 2015-10-09 DIAGNOSIS — R0981 Nasal congestion: Secondary | ICD-10-CM | POA: Diagnosis not present

## 2015-10-09 DIAGNOSIS — E785 Hyperlipidemia, unspecified: Secondary | ICD-10-CM | POA: Diagnosis not present

## 2015-10-09 LAB — COMPREHENSIVE METABOLIC PANEL
ALT: 24 U/L (ref 17–63)
AST: 21 U/L (ref 15–41)
Albumin: 3.6 g/dL (ref 3.5–5.0)
Alkaline Phosphatase: 67 U/L (ref 38–126)
Anion gap: 7 (ref 5–15)
BUN: 12 mg/dL (ref 6–20)
CO2: 25 mmol/L (ref 22–32)
Calcium: 8.3 mg/dL — ABNORMAL LOW (ref 8.9–10.3)
Chloride: 103 mmol/L (ref 101–111)
Creatinine, Ser: 0.8 mg/dL (ref 0.61–1.24)
GFR calc Af Amer: >60 mL/min (ref >60)
GFR calc non Af Amer: >60 mL/min (ref >60)
Glucose, Bld: 115 mg/dL — ABNORMAL HIGH (ref 65–99)
Potassium: 2.7 mmol/L — CL (ref 3.5–5.1)
Sodium: 135 mmol/L (ref 135–145)
Total Bilirubin: 1.2 mg/dL (ref 0.3–1.2)
Total Protein: 7 g/dL (ref 6.5–8.1)

## 2015-10-09 LAB — CBC WITH DIFFERENTIAL/PLATELET
Basophils Absolute: 0 10*3/uL (ref 0.0–0.1)
Basophils Relative: 0 %
Eosinophils Absolute: 0 10*3/uL (ref 0.0–0.7)
Eosinophils Relative: 0 %
HCT: 41.8 % (ref 39.0–52.0)
Hemoglobin: 14.2 g/dL (ref 13.0–17.0)
Lymphocytes Relative: 14 %
Lymphs Abs: 0.6 10*3/uL — ABNORMAL LOW (ref 0.7–4.0)
MCH: 29 pg (ref 26.0–34.0)
MCHC: 34 g/dL (ref 30.0–36.0)
MCV: 85.5 fL (ref 78.0–100.0)
Monocytes Absolute: 0.3 10*3/uL (ref 0.1–1.0)
Monocytes Relative: 6 %
Neutro Abs: 3.4 10*3/uL (ref 1.7–7.7)
Neutrophils Relative %: 80 %
Platelets: 180 10*3/uL (ref 150–400)
RBC: 4.89 MIL/uL (ref 4.22–5.81)
RDW: 12.6 % (ref 11.5–15.5)
WBC: 4.3 10*3/uL (ref 4.0–10.5)

## 2015-10-09 MED ORDER — MAGNESIUM SULFATE 2 GM/50ML IV SOLN
2.0000 g | Freq: Once | INTRAVENOUS | Status: AC
  Administered 2015-10-09: 2 g via INTRAVENOUS
  Filled 2015-10-09: qty 50

## 2015-10-09 MED ORDER — POTASSIUM CHLORIDE CRYS ER 20 MEQ PO TBCR
60.0000 meq | EXTENDED_RELEASE_TABLET | Freq: Once | ORAL | Status: AC
  Administered 2015-10-09: 60 meq via ORAL
  Filled 2015-10-09: qty 3

## 2015-10-09 MED ORDER — POTASSIUM CHLORIDE CRYS ER 20 MEQ PO TBCR
40.0000 meq | EXTENDED_RELEASE_TABLET | Freq: Once | ORAL | Status: AC
  Administered 2015-10-09: 40 meq via ORAL
  Filled 2015-10-09: qty 2

## 2015-10-09 MED ORDER — LOPERAMIDE HCL 2 MG PO CAPS: 4.0000 mg | ORAL_CAPSULE | Freq: Four times a day (QID) | ORAL | Status: DC | PRN

## 2015-10-09 MED ORDER — ACETAMINOPHEN 500 MG PO TABS
1000.0000 mg | ORAL_TABLET | Freq: Once | ORAL | Status: AC
  Administered 2015-10-09: 1000 mg via ORAL
  Filled 2015-10-09: qty 2

## 2015-10-09 MED ORDER — CIPROFLOXACIN HCL 250 MG PO TABS
500.0000 mg | ORAL_TABLET | Freq: Once | ORAL | Status: AC
  Administered 2015-10-09: 500 mg via ORAL
  Filled 2015-10-09: qty 2

## 2015-10-09 MED ORDER — CIPROFLOXACIN HCL 500 MG PO TABS: 500.0000 mg | ORAL_TABLET | Freq: Once | ORAL | Status: DC

## 2015-10-09 NOTE — ED Notes (Signed)
Pt c/o fever that started this morning with diarrhea, no vomiting.  Pt also c/o headache and generalized pain

## 2015-10-09 NOTE — Discharge Instructions (Signed)
Take the antibiotics for the suspected colitis. Imodium to be taken at night time or for severe diarrhea causing dehydration.  Return to the ER if there is any signs of dehydration, bloody stools, fainting, palpitations of the heart.   Colitis Colitis is inflammation of the colon. Colitis may last a short time (acute) or it may last a long time (chronic). CAUSES This condition may be caused by:  Viruses.  Bacteria.  Reactions to medicine.  Certain autoimmune diseases, such as Crohn disease or ulcerative colitis. SYMPTOMS Symptoms of this condition include:  Diarrhea.  Passing bloody or tarry stool.  Pain.  Fever.  Vomiting.  Tiredness (fatigue).  Weight loss.  Bloating.  Sudden increase in abdominal pain.  Having fewer bowel movements than usual. DIAGNOSIS This condition is diagnosed with a stool test or a blood test. You may also have other tests, including X-rays, a CT scan, or a colonoscopy. TREATMENT Treatment may include:  Resting the bowel. This involves not eating or drinking for a period of time.  Fluids that are given through an IV tube.  Medicine for pain and diarrhea.  Antibiotic medicines.  Cortisone medicines.  Surgery. HOME CARE INSTRUCTIONS Eating and Drinking  Follow instructions from your health care provider about eating or drinking restrictions.  Drink enough fluid to keep your urine clear or pale yellow.  Work with a dietitian to determine which foods cause your condition to flare up.  Avoid foods that cause flare-ups.  Eat a well-balanced diet. Medicines  Take over-the-counter and prescription medicines only as told by your health care provider.  If you were prescribed an antibiotic medicine, take it as told by your health care provider. Do not stop taking the antibiotic even if you start to feel better. General Instructions  Keep all follow-up visits as told by your health care provider. This is important. SEEK MEDICAL  CARE IF:  Your symptoms do not go away.  You develop new symptoms. SEEK IMMEDIATE MEDICAL CARE IF:  You have a fever that does not go away with treatment.  You develop chills.  You have extreme weakness, fainting, or dehydration.  You have repeated vomiting.  You develop severe pain in your abdomen.  You pass bloody or tarry stool.   This information is not intended to replace advice given to you by your health care provider. Make sure you discuss any questions you have with your health care provider.   Document Released: 08/17/2004 Document Revised: 03/31/2015 Document Reviewed: 11/02/2014 Elsevier Interactive Patient Education 2016 Elsevier Inc. Dehydration, Adult Dehydration is a condition in which you do not have enough fluid or water in your body. It happens when you take in less fluid than you lose. Vital organs such as the kidneys, brain, and heart cannot function without a proper amount of fluids. Any loss of fluids from the body can cause dehydration.  Dehydration can range from mild to severe. This condition should be treated right away to help prevent it from becoming severe. CAUSES  This condition may be caused by:  Vomiting.  Diarrhea.  Excessive sweating, such as when exercising in hot or humid weather.  Not drinking enough fluid during strenuous exercise or during an illness.  Excessive urine output.  Fever.  Certain medicines. RISK FACTORS This condition is more likely to develop in:  People who are taking certain medicines that cause the body to lose excess fluid (diuretics).   People who have a chronic illness, such as diabetes, that may increase urination.  Older  adults.   People who live at high altitudes.   People who participate in endurance sports.  SYMPTOMS  Mild Dehydration  Thirst.  Dry lips.  Slightly dry mouth.  Dry, warm skin. Moderate Dehydration  Very dry mouth.   Muscle cramps.   Dark urine and decreased  urine production.   Decreased tear production.   Headache.   Light-headedness, especially when you stand up from a sitting position.  Severe Dehydration  Changes in skin.   Cold and clammy skin.   Skin does not spring back quickly when lightly pinched and released.   Changes in body fluids.   Extreme thirst.   No tears.   Not able to sweat when body temperature is high, such as in hot weather.   Minimal urine production.   Changes in vital signs.   Rapid, weak pulse (more than 100 beats per minute when you are sitting still).   Rapid breathing.   Low blood pressure.   Other changes.   Sunken eyes.   Cold hands and feet.   Confusion.  Lethargy and difficulty being awakened.  Fainting (syncope).   Short-term weight loss.   Unconsciousness. DIAGNOSIS  This condition may be diagnosed based on your symptoms. You may also have tests to determine how severe your dehydration is. These tests may include:   Urine tests.   Blood tests.  TREATMENT  Treatment for this condition depends on the severity. Mild or moderate dehydration can often be treated at home. Treatment should be started right away. Do not wait until dehydration becomes severe. Severe dehydration needs to be treated at the hospital. Treatment for Mild Dehydration  Drinking plenty of water to replace the fluid you have lost.   Replacing minerals in your blood (electrolytes) that you may have lost.  Treatment for Moderate Dehydration  Consuming oral rehydration solution (ORS). Treatment for Severe Dehydration  Receiving fluid through an IV tube.   Receiving electrolyte solution through a feeding tube that is passed through your nose and into your stomach (nasogastric tube or NG tube).  Correcting any abnormalities in electrolytes. HOME CARE INSTRUCTIONS   Drink enough fluid to keep your urine clear or pale yellow.   Drink water or fluid slowly by taking small  sips. You can also try sucking on ice cubes.  Have food or beverages that contain electrolytes. Examples include bananas and sports drinks.  Take over-the-counter and prescription medicines only as told by your health care provider.   Prepare ORS according to the manufacturer's instructions. Take sips of ORS every 5 minutes until your urine returns to normal.  If you have vomiting or diarrhea, continue to try to drink water, ORS, or both.   If you have diarrhea, avoid:   Beverages that contain caffeine.   Fruit juice.   Milk.   Carbonated soft drinks.  Do not take salt tablets. This can lead to the condition of having too much sodium in your body (hypernatremia).  SEEK MEDICAL CARE IF:  You cannot eat or drink without vomiting.  You have had moderate diarrhea during a period of more than 24 hours.  You have a fever. SEEK IMMEDIATE MEDICAL CARE IF:   You have extreme thirst.  You have severe diarrhea.  You have not urinated in 6-8 hours, or you have urinated only a small amount of very dark urine.  You have shriveled skin.  You are dizzy, confused, or both.   This information is not intended to replace advice given to you  by your health care provider. Make sure you discuss any questions you have with your health care provider.   Document Released: 07/10/2005 Document Revised: 03/31/2015 Document Reviewed: 11/25/2014 Elsevier Interactive Patient Education [employer].

## 2015-10-09 NOTE — ED Notes (Signed)
CRITICAL VALUE ALERT  Critical value received:  Potassium 2.7  Date of notification: 3/18  Time of notification: [id number]  Critical value read back:Yes.    Nurse who received alert:  S. Frye-Yarbrough  MD notified (1st page):  201  Time of first page:  201   Responding MD:  Kathrynn Humble

## 2015-10-09 NOTE — ED Notes (Signed)
Pharmacy called about Cipro RX written by Dr Kathrynn Humble last night. Dr Sabra Heck consulted and RX changed to BID x 10 days. Scott at [facility] notified of change, verified change with read-back verification.

## 2015-10-09 NOTE — ED Provider Notes (Signed)
CSN: [id number]     Arrival date & time 10/09/15  0015 History  By signing my name below, I, Helane Gunther, attest that this documentation has been prepared under the direction and in the presence of Varney Biles, MD. Electronically Signed: Helane Gunther, ED Scribe. 10/09/2015. 1:05 AM.    Chief Complaint  Patient presents with  . Fever   The history is provided by the patient. No language interpreter was used.   HPI Comments: Steven [name] is a 65 y.o. male former smoker with a PMHx of HTN, HLD, cancer, and arthritis who presents to the Emergency Department complaining of subjective fever onset this morning. He reports associated HA (minimal, behind the eyes), diarrhea (watery), loss of appetite, weakness, generalized myalgias, mild nasal congestion, feeling cold, and mild mid-abdominal pain. He states he has had more than 5 BM's today. He notes he has been taking pain medication for arthritis, but no antibiotics. He denies any recent hospital visits or unusual foods. He denies any recent trauma or sick contacts. Pt denies n/v, CP, cough, and SOB.  Past Medical History  Diagnosis Date  . Hypertension   . Arthritis   . Cancer [facility])     prostate s/p radiation Mar 2013  . History of radiation therapy 08/22/11-10/13/11    prostate  . Chest pain   . Hyperlipemia   . Fracture 08/17/2015    MULTIPLE RIB FRACTURES     FROM FALL    Past Surgical History  Procedure Laterality Date  . Kidney surgery  1982  . 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 Normal appearing colon   Family History  Problem Relation Age of Onset  . Colon cancer Neg Hx    Social History  Substance Use Topics  . Smoking status: Former Smoker -- 1.00 packs/day for 20 years    Types: Cigarettes    Quit date: 07/13/2011  . Smokeless tobacco: Current User     Comment: Quit smoking x 2 years    07/2015   SOMETIIMES i  USE VAPOR   . Alcohol Use: No     Comment: rarely    Review of Systems  Constitutional: Positive for fever and appetite change.  HENT: Positive for congestion.   Respiratory: Negative for cough and shortness of breath.   Cardiovascular: Negative for chest pain.  Gastrointestinal: Positive for abdominal pain and diarrhea. Negative for nausea and vomiting.  Musculoskeletal: Positive for myalgias.  Neurological: Positive for weakness and headaches.    Allergies  Review of patient's allergies indicates no known allergies.  Home Medications   Prior to Admission medications   Medication Sig Start Date End Date Taking? Authorizing Provider  amLODipine (NORVASC) 10 MG tablet Take 10 mg by mouth daily.    Historical Provider, MD  aspirin 81 MG tablet Take 81 mg by mouth daily.    Historical Provider, MD  ciprofloxacin (CIPRO) 500 MG tablet Take 1 tablet (500 mg total) by mouth once. 10/09/15   Varney Biles, MD  docusate sodium (COLACE) 100 MG capsule Take 1 capsule (100 mg total) by mouth 2 (two) times daily. 08/19/15   Lisette Abu, PA-C  hydrochlorothiazide (HYDRODIURIL) 25 MG tablet Take 25 mg by mouth daily.    Historical Provider, MD  loperamide (IMODIUM) 2 MG capsule Take 2 capsules (4 mg total) by mouth 4 (four) times daily as needed for diarrhea or loose stools. 10/09/15   Zorian Gunderman  Kathrynn Humble, MD  magnesium citrate SOLN Take 296 mLs (1 Bottle total) by mouth daily as needed for severe constipation. 08/19/15   Lisette Abu, PA-C  methocarbamol (ROBAXIN) 500 MG tablet Take 2 tablets (1,000 mg total) by mouth every 6 (six) hours as needed for muscle spasms. 08/19/15   Lisette Abu, PA-C  naproxen (NAPROSYN) 500 MG tablet Take 1 tablet (500 mg total) by mouth 2 (two) times daily with a meal. 08/19/15   Lisette Abu, PA-C  ondansetron (ZOFRAN) 4 MG tablet Take 1 tablet (4 mg total) by mouth every 6 (six) hours as needed for nausea. 08/19/15   Lisette Abu, PA-C   oxyCODONE-acetaminophen (PERCOCET) 7.5-325 MG tablet Take 1-2 tablets by mouth every 4 (four) hours as needed. 08/19/15   Lisette Abu, PA-C  polyethylene glycol [facility]) packet Take 17 g by mouth daily. 08/19/15   Lisette Abu, PA-C  Potassium (POTASSIMIN PO) Take 1 tablet by mouth daily.    Historical Provider, MD  VITAMIN E PO Take 1 tablet by mouth daily.    Historical Provider, MD   BP 122/64 mmHg  Pulse 98  Temp(Src) 101.8 F (38.8 C) (Oral)  Resp 20  Ht 5\' 6"  (1.676 m)  Wt 216 lb (97.977 kg)  BMI 34.88 kg/m2  SpO2 98% Physical Exam  Constitutional: He is oriented to person, place, and time. He appears well-developed and well-nourished.  HENT:  Head: Normocephalic and atraumatic.  Eyes: Conjunctivae are normal. Right eye exhibits no discharge. Left eye exhibits no discharge.  Cardiovascular: Normal rate, regular rhythm and normal heart sounds.   Pulmonary/Chest: Effort normal. No respiratory distress.  Abdominal: Soft. There is no tenderness.  Lymphadenopathy:    He has no cervical adenopathy.  Neurological: He is alert and oriented to person, place, and time. Coordination normal.  Skin: Skin is warm and dry. No rash noted. He is not diaphoretic. No erythema.  Psychiatric: He has a normal mood and affect.  Nursing note and vitals reviewed.   ED Course  Procedures  DIAGNOSTIC STUDIES: Oxygen Saturation is 98% on RA, normal by my interpretation.    COORDINATION OF CARE: 1:02 AM - Discussed plans to order diagnostic studies. Pt advised of plan for treatment and pt agrees. 3:00: results discussed. Pt will get antibiotics here - as he has a fever with > 5 loose BM. Stool studies ordered. 4:50 : potassium repleted and pt was asymptomatic with the low K. Mag given.  Labs Review Labs Reviewed  CBC WITH DIFFERENTIAL/PLATELET - Abnormal; Notable for the following:    Lymphs Abs 0.6 (*)    All other components within normal limits  COMPREHENSIVE METABOLIC  PANEL - Abnormal; Notable for the following:    Potassium 2.7 (*)    Glucose, Bld 115 (*)    Calcium 8.3 (*)    All other components within normal limits  GASTROINTESTINAL PANEL BY PCR, STOOL (REPLACES STOOL CULTURE)  URINALYSIS, ROUTINE W REFLEX MICROSCOPIC (NOT AT [facility])    Imaging Review No results found. I have personally reviewed and evaluated these images and lab results as part of my medical decision-making.   EKG Interpretation None      MDM   Final diagnoses:  Colitis, acute  Hypokalemia    I personally performed the services described in this documentation, which was scribed in my presence. The recorded information has been reviewed and is accurate.  Pt comes in with cc of diarrhea, fevers. Abd exam is non peritoneal.  No bloody stools. No recent antibiotics. Pt is immunocompetent. He has no significant abd tenderness, and has no emesis.  Will get basic labs and get stool studies. Will give antibiotics for presumed colitis for fevers and loose BM > 5 times.  K will be replaced. Mag to be given empirically in the setting of pretty low K that is likely due to diarrhea.  Strict ER return precautions have been discussed, and patient is agreeing with the plan and is comfortable with the workup done and the recommendations from the ER.   Varney Biles, MD 10/09/15 [phone]

## 2015-10-10 LAB — GASTROINTESTINAL PANEL BY PCR, STOOL (REPLACES STOOL CULTURE)
Adenovirus F40/41: NOT DETECTED
Astrovirus: NOT DETECTED
Campylobacter species: NOT DETECTED
Cryptosporidium: NOT DETECTED
Cyclospora cayetanensis: NOT DETECTED
E. coli O157: NOT DETECTED
Entamoeba histolytica: NOT DETECTED
Enteroaggregative E coli (EAEC): NOT DETECTED
Enteropathogenic E coli (EPEC): NOT DETECTED
Enterotoxigenic E coli (ETEC): NOT DETECTED
Giardia lamblia: NOT DETECTED
Norovirus GI/GII: DETECTED — AB
Plesimonas shigelloides: NOT DETECTED
Rotavirus A: NOT DETECTED
Salmonella species: NOT DETECTED
Sapovirus (I, II, IV, and V): NOT DETECTED
Shiga like toxin producing E coli (STEC): NOT DETECTED
Shigella/Enteroinvasive E coli (EIEC): NOT DETECTED
Vibrio cholerae: NOT DETECTED
Vibrio species: NOT DETECTED
Yersinia enterocolitica: NOT DETECTED

## 2015-10-10 NOTE — ED Notes (Signed)
Lab called and reports pt positive for norovirus.  Notified Dr. Thurnell Garbe.

## 2015-10-10 NOTE — ED Notes (Signed)
Notified pt he was positive for norovirus, instructed pt to wash hands frequently.

## 2016-03-03 ENCOUNTER — Ambulatory Visit (INDEPENDENT_AMBULATORY_CARE_PROVIDER_SITE_OTHER): Payer: BC Managed Care – PPO | Admitting: Urology

## 2016-03-03 DIAGNOSIS — Z8546 Personal history of malignant neoplasm of prostate: Secondary | ICD-10-CM | POA: Diagnosis not present

## 2016-03-03 DIAGNOSIS — N5201 Erectile dysfunction due to arterial insufficiency: Secondary | ICD-10-CM

## 2016-11-16 ENCOUNTER — Ambulatory Visit (INDEPENDENT_AMBULATORY_CARE_PROVIDER_SITE_OTHER): Payer: BC Managed Care – PPO | Admitting: Podiatry

## 2016-11-16 ENCOUNTER — Encounter: Payer: Self-pay | Admitting: Podiatry

## 2016-11-16 VITALS — Resp 16 | Ht 66.0 in | Wt 213.0 lb

## 2016-11-16 DIAGNOSIS — M7661 Achilles tendinitis, right leg: Secondary | ICD-10-CM

## 2016-11-16 DIAGNOSIS — L600 Ingrowing nail: Secondary | ICD-10-CM

## 2016-11-16 NOTE — Patient Instructions (Addendum)
Soak Instructions    THE DAY AFTER THE PROCEDURE  Place 1/4 cup of epsom salts in a quart of warm tap water.  Submerge your foot or feet with outer bandage intact for the initial soak; this will allow the bandage to become moist and wet for easy lift off.  Once you remove your bandage, continue to soak in the solution for 20 minutes.  This soak should be done twice a day.  Next, remove your foot or feet from solution, blot dry the affected area and cover.  You may use a band aid large enough to cover the area or use gauze and tape.  Apply other medications to the area as directed by the doctor such as polysporin neosporin.  IF YOUR SKIN BECOMES IRRITATED WHILE USING THESE INSTRUCTIONS, IT IS OKAY TO SWITCH TO  WHITE VINEGAR AND WATER. Or you may use antibacterial soap and water to keep the toe clean  Monitor for any signs/symptoms of infection. Call the office immediately if any occur or go directly to the emergency room. Call with any questions/concerns.    Long Term Care Instructions-Post Nail Surgery  You have had your ingrown toenail and root treated with a chemical.  This chemical causes a burn that will drain and ooze like a blister.  This can drain for 6-8 weeks or longer.  It is important to keep this area clean, covered, and follow the soaking instructions dispensed at the time of your surgery.  This area will eventually dry and form a scab.  Once the scab forms you no longer need to soak or apply a dressing.  If at any time you experience an increase in pain, redness, swelling, or drainage, you should contact the office as soon as possible.  Achilles Tendinitis  with Rehab Achilles tendinitis is a disorder of the Achilles tendon. The Achilles tendon connects the large calf muscles (Gastrocnemius and Soleus) to the heel bone (calcaneus). This tendon is sometimes called the heel cord. It is important for pushing-off and standing on your toes and is important for walking, running, or  jumping. Tendinitis is often caused by overuse and repetitive microtrauma. SYMPTOMS  Pain, tenderness, swelling, warmth, and redness may occur over the Achilles tendon even at rest.  Pain with pushing off, or flexing or extending the ankle.  Pain that is worsened after or during activity. CAUSES   Overuse sometimes seen with rapid increase in exercise programs or in sports requiring running and jumping.  Poor physical conditioning (strength and flexibility or endurance).  Running sports, especially training running down hills.  Inadequate warm-up before practice or play or failure to stretch before participation.  Injury to the tendon. PREVENTION   Warm up and stretch before practice or competition.  Allow time for adequate rest and recovery between practices and competition.  Keep up conditioning.  Keep up ankle and leg flexibility.  Improve or keep muscle strength and endurance.  Improve cardiovascular fitness.  Use proper technique.  Use proper equipment (shoes, skates).  To help prevent recurrence, taping, protective strapping, or an adhesive bandage may be recommended for several weeks after healing is complete. PROGNOSIS   Recovery may take weeks to several months to heal.  Longer recovery is expected if symptoms have been prolonged.  Recovery is usually quicker if the inflammation is due to a direct blow as compared with overuse or sudden strain. RELATED COMPLICATIONS   Healing time will be prolonged if the condition is not correctly treated. The injury must be   given plenty of time to heal.  Symptoms can reoccur if activity is resumed too soon.  Untreated, tendinitis may increase the risk of tendon rupture requiring additional time for recovery and possibly surgery. TREATMENT   The first treatment consists of rest anti-inflammatory medication, and ice to relieve the pain.  Stretching and strengthening exercises after resolution of pain will likely help  reduce the risk of recurrence. Referral to a physical therapist or athletic trainer for further evaluation and treatment may be helpful.  A walking boot or cast may be recommended to rest the Achilles tendon. This can help break the cycle of inflammation and microtrauma.  Arch supports (orthotics) may be prescribed or recommended by your caregiver as an adjunct to therapy and rest.  Surgery to remove the inflamed tendon lining or degenerated tendon tissue is rarely necessary and has shown less than predictable results. MEDICATION   Nonsteroidal anti-inflammatory medications, such as aspirin and ibuprofen, may be used for pain and inflammation relief. Do not take within 7 days before surgery. Take these as directed by your caregiver. Contact your caregiver immediately if any bleeding, stomach upset, or signs of allergic reaction occur. Other minor pain relievers, such as acetaminophen, may also be used.  Pain relievers may be prescribed as necessary by your caregiver. Do not take prescription pain medication for longer than 4 to 7 days. Use only as directed and only as much as you need.  Cortisone injections are rarely indicated. Cortisone injections may weaken tendons and predispose to rupture. It is better to give the condition more time to heal than to use them. HEAT AND COLD  Cold is used to relieve pain and reduce inflammation for acute and chronic Achilles tendinitis. Cold should be applied for 10 to 15 minutes every 2 to 3 hours for inflammation and pain and immediately after any activity that aggravates your symptoms. Use ice packs or an ice massage.  Heat may be used before performing stretching and strengthening activities prescribed by your caregiver. Use a heat pack or a warm soak. SEEK MEDICAL CARE IF:  Symptoms get worse or do not improve in 2 weeks despite treatment.  New, unexplained symptoms develop. Drugs used in treatment may produce side effects.  EXERCISES:  RANGE OF  MOTION (ROM) AND STRETCHING EXERCISES - Achilles Tendinitis  These exercises may help you when beginning to rehabilitate your injury. Your symptoms may resolve with or without further involvement from your physician, physical therapist or athletic trainer. While completing these exercises, remember:   Restoring tissue flexibility helps normal motion to return to the joints. This allows healthier, less painful movement and activity.  An effective stretch should be held for at least 30 seconds.  A stretch should never be painful. You should only feel a gentle lengthening or release in the stretched tissue.  STRETCH  Gastroc, Standing   Place hands on wall.  Extend right / left leg, keeping the front knee somewhat bent.  Slightly point your toes inward on your back foot.  Keeping your right / left heel on the floor and your knee straight, shift your weight toward the wall, not allowing your back to arch.  You should feel a gentle stretch in the right / left calf. Hold this position for 10 seconds. Repeat 3 times. Complete this stretch 2 times per day.  STRETCH  Soleus, Standing   Place hands on wall.  Extend right / left leg, keeping the other knee somewhat bent.  Slightly point your toes inward on   your back foot.  Keep your right / left heel on the floor, bend your back knee, and slightly shift your weight over the back leg so that you feel a gentle stretch deep in your back calf.  Hold this position for 10 seconds. Repeat 3 times. Complete this stretch 2 times per day.  STRETCH  Gastrocsoleus, Standing  Note: This exercise can place a lot of stress on your foot and ankle. Please complete this exercise only if specifically instructed by your caregiver.   Place the ball of your right / left foot on a step, keeping your other foot firmly on the same step.  Hold on to the wall or a rail for balance.  Slowly lift your other foot, allowing your body weight to press your heel down  over the edge of the step.  You should feel a stretch in your right / left calf.  Hold this position for 10 seconds.  Repeat this exercise with a slight bend in your knee. Repeat 3 times. Complete this stretch 2 times per day.   STRENGTHENING EXERCISES - Achilles Tendinitis These exercises may help you when beginning to rehabilitate your injury. They may resolve your symptoms with or without further involvement from your physician, physical therapist or athletic trainer. While completing these exercises, remember:   Muscles can gain both the endurance and the strength needed for everyday activities through controlled exercises.  Complete these exercises as instructed by your physician, physical therapist or athletic trainer. Progress the resistance and repetitions only as guided.  You may experience muscle soreness or fatigue, but the pain or discomfort you are trying to eliminate should never worsen during these exercises. If this pain does worsen, stop and make certain you are following the directions exactly. If the pain is still present after adjustments, discontinue the exercise until you can discuss the trouble with your clinician.  STRENGTH - Plantar-flexors   Sit with your right / left leg extended. Holding onto both ends of a rubber exercise band/tubing, loop it around the ball of your foot. Keep a slight tension in the band.  Slowly push your toes away from you, pointing them downward.  Hold this position for 10 seconds. Return slowly, controlling the tension in the band/tubing. Repeat 3 times. Complete this exercise 2 times per day.   STRENGTH - Plantar-flexors   Stand with your feet shoulder width apart. Steady yourself with a wall or table using as little support as needed.  Keeping your weight evenly spread over the width of your feet, rise up on your toes.*  Hold this position for 10 seconds. Repeat 3 times. Complete this exercise 2 times per day.  *If this is too  easy, shift your weight toward your right / left leg until you feel challenged. Ultimately, you may be asked to do this exercise with your right / left foot only.  STRENGTH  Plantar-flexors, Eccentric  Note: This exercise can place a lot of stress on your foot and ankle. Please complete this exercise only if specifically instructed by your caregiver.   Place the balls of your feet on a step. With your hands, use only enough support from a wall or rail to keep your balance.  Keep your knees straight and rise up on your toes.  Slowly shift your weight entirely to your right / left toes and pick up your opposite foot. Gently and with controlled movement, lower your weight through your right / left foot so that your heel drops below   the level of the step. You will feel a slight stretch in the back of your calf at the end position.  Use the healthy leg to help rise up onto the balls of both feet, then lower weight only on the right / left leg again. Build up to 15 repetitions. Then progress to 3 consecutive sets of 15 repetitions.*  After completing the above exercise, complete the same exercise with a slight knee bend (about 30 degrees). Again, build up to 15 repetitions. Then progress to 3 consecutive sets of 15 repetitions.* Perform this exercise 2 times per day.  *When you easily complete 3 sets of 15, your physician, physical therapist or athletic trainer may advise you to add resistance by wearing a backpack filled with additional weight.  STRENGTH - Plantar Flexors, Seated   Sit on a chair that allows your feet to rest flat on the ground. If necessary, sit at the edge of the chair.  Keeping your toes firmly on the ground, lift your right / left heel as far as you can without increasing any discomfort in your ankle. Repeat 3 times. Complete this exercise 2 times a day.  

## 2016-11-16 NOTE — Progress Notes (Signed)
   Subjective:    Patient ID: Steven [name], male    DOB: May 22, 1951, 66 y.o.   MRN: [id number]  HPI Chief Complaint  Patient presents with  . Nail Problem    Right foot; great toe-medial side; x1.5 months; Bilateral nail discoloration; nails need to get to checked for nail fungus      Review of Systems  Musculoskeletal: Positive for arthralgias.  All other systems reviewed and are negative.      Objective:   Physical Exam        Assessment & Plan:

## 2016-11-18 NOTE — Progress Notes (Signed)
Subjective:    Patient ID: Steven [name], male   DOB: 66 y.o.   MRN: [id number]   HPI patient presents with painful ingrown toenail deformity right hallux medial border that he's not been able to cut out himself and it's been present for several months and nail discoloration    Review of Systems  All other systems reviewed and are negative.       Objective:  Physical Exam  Cardiovascular: Intact distal pulses.   Musculoskeletal: Normal range of motion.  Neurological: He is alert.  Skin: Skin is warm.  Nursing note and vitals reviewed.  neurovascular status intact muscle strength was adequate range of motion within normal limits with patient found to have incurvated medial border of the right hallux that's painful when pressed and making shoe gear difficult. Patient is also noted to have yellow discoloration of adjacent nails with no other pathology noted     Assessment:     Inflammatory ingrown toenail deformity right hallux medial border and mycotic nail infection    Plan:    H&P conditions reviewed and recommended correction of ingrown toenail. Discussed fungus and other treatments we may consider including laser and we will focus on that later and I did explain risk of procedure. Today I infiltrated the right hallux 60 mg Xylocaine Marcaine mixture removed the medial border exposed matrix and applied phenol 3 applications 30 seconds followed by alcohol lavage and sterile dressing gave instructions on soaks and reappoint

## 2016-11-20 ENCOUNTER — Ambulatory Visit: Payer: BC Managed Care – PPO | Admitting: Podiatry

## 2016-12-01 ENCOUNTER — Ambulatory Visit (INDEPENDENT_AMBULATORY_CARE_PROVIDER_SITE_OTHER): Payer: BC Managed Care – PPO | Admitting: Urology

## 2016-12-01 DIAGNOSIS — Z8546 Personal history of malignant neoplasm of prostate: Secondary | ICD-10-CM

## 2016-12-01 DIAGNOSIS — N5201 Erectile dysfunction due to arterial insufficiency: Secondary | ICD-10-CM | POA: Diagnosis not present

## 2016-12-01 DIAGNOSIS — R350 Frequency of micturition: Secondary | ICD-10-CM

## 2017-01-19 ENCOUNTER — Ambulatory Visit: Payer: Self-pay | Admitting: Urology

## 2017-06-01 ENCOUNTER — Ambulatory Visit: Payer: BC Managed Care – PPO | Admitting: Urology

## 2017-06-01 DIAGNOSIS — Z8546 Personal history of malignant neoplasm of prostate: Secondary | ICD-10-CM

## 2017-06-01 DIAGNOSIS — N5201 Erectile dysfunction due to arterial insufficiency: Secondary | ICD-10-CM

## 2017-08-02 ENCOUNTER — Ambulatory Visit: Payer: BC Managed Care – PPO | Admitting: Nurse Practitioner

## 2017-08-02 ENCOUNTER — Encounter: Payer: Self-pay | Admitting: Nurse Practitioner

## 2017-08-02 VITALS — BP 146/82 | HR 62 | Temp 97.0°F | Ht 66.0 in | Wt 217.8 lb

## 2017-08-02 DIAGNOSIS — K649 Unspecified hemorrhoids: Secondary | ICD-10-CM | POA: Diagnosis not present

## 2017-08-02 DIAGNOSIS — K625 Hemorrhage of anus and rectum: Secondary | ICD-10-CM

## 2017-08-02 DIAGNOSIS — K5900 Constipation, unspecified: Secondary | ICD-10-CM

## 2017-08-02 MED ORDER — HYDROCORTISONE 2.5 % RE CREA: 1.0000 "application " | TOPICAL_CREAM | Freq: Two times a day (BID) | RECTAL | 1 refills | Status: DC | PRN

## 2017-08-02 NOTE — Progress Notes (Signed)
Primary Care Physician:  Scherrie Bateman Primary Gastroenterologist:  Dr. Gala Romney  Chief Complaint  Patient presents with  . Blood In Stools    3 weeks ago  . Constipation    Bm's 1-2 days if he takes miralax    HPI:   Steven [name] is a 67 y.o. male who presents with constipation and blood in stool.  The patient has not been seen by our office since 03/19/2013.  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  Also noted internal hemorrhoids.  Recommended a stool softener for 1 month and follow-up in 6 weeks in the outpatient setting.  At his last visit he had been noted to be on Canasa suppositories on and off since his previous visit.  He used to [employer] for 2 weeks but could not afford the prescription and rely on samples.  His last 3-4 bowel movements had no bleeding although intermittent bright red blood per rectum.  1-2 bowel movements a day which are formed with no rectal pain.  He was offered repeat APC treatment but was not interested in this at that time.  Trial of Canasa 1 g every other day for 30 days with attempt to you samples because he could not afford the prescription.  Today he states he's doing well overall. Had some rectal bleeding about 3 weeks ago for 1 episode, scant toilet tissue hematochezia. Was constipated at that time. No further bleeding. Stays constipated unless he takes MiraLAX. He seems like he doesn't want to use MiraLAX chronically but states his urologist recommended he continue it. If he doesn't use it he will have dry, hard stools and painful defecation. Has some increased gas on MiraLAX. Overall he feels it works well. He is wanting a one time treatment/pill to fix his constipation. Has some intermittent morning time abdominal pain before using the bathroom, pain relieves with bowel movement. Denies N/V, melena,  fever, chills, unintentional weight loss. Denies chest pain, dyspnea, dizziness, lightheadedness, syncope, near syncope. Denies any other upper or lower GI symptoms.  Past Medical History:  Diagnosis Date  . Arthritis   . Cancer [facility])    prostate s/p radiation Mar 2013  . Chest pain   . Fracture 08/17/2015   MULTIPLE RIB FRACTURES     FROM FALL   . History of radiation therapy 08/22/11-10/13/11   prostate  . Hyperlipemia   . Hypertension     Past Surgical History:  Procedure Laterality Date  . COLONOSCOPY N/A 12/19/2012   LZJ:QBHALP 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 Normal appearing colon  . KIDNEY SURGERY  1982    Current Outpatient Medications  Medication Sig Dispense Refill  . amLODipine (NORVASC) 10 MG tablet Take 10 mg by mouth daily.    Marland Kitchen aspirin 81 MG tablet Take 81 mg by mouth daily.    . hydrochlorothiazide (HYDRODIURIL) 25 MG tablet Take 25 mg by mouth daily.    . polyethylene glycol (MIRALAX / GLYCOLAX) packet Take 17 g by mouth daily. (Patient taking differently: Take 17 g by mouth as needed. )    . Potassium (POTASSIMIN PO) Take 1 tablet by mouth daily.    Marland Kitchen VITAMIN E PO Take 1 tablet by mouth daily.     No current facility-administered medications for this visit.     Allergies as of 08/02/2017  . (  No Known Allergies)    Family History  Problem Relation Age of Onset  . Colon cancer Neg Hx     Social History   Socioeconomic History  . Marital status: Single    Spouse name: Not on file  . Number of children: Not on file  . Years of education: Not on file  . Highest education level: Not on file  Social Needs  . Financial resource strain: Not on file  . Food insecurity - worry: Not on file  . Food insecurity - inability: Not on file  . Transportation needs - medical: Not on file  . Transportation needs - non-medical: Not on file  Occupational History  . Occupation: Custodian      Employer: [employer]  Tobacco Use  . Smoking status: Former Smoker    Packs/day: 1.00    Years: 20.00    Pack years: 20.00    Types: Cigarettes    Last attempt to quit: 07/13/2011    Years since quitting: 6.0  . Smokeless tobacco: Current User  . Tobacco comment: Quit smoking x 2 years    07/2015   SOMETIIMES i USE VAPOR   Substance and Sexual Activity  . Alcohol use: No    Comment: rarely  . Drug use: No  . Sexual activity: Not on file  Other Topics Concern  . Not on file  Social History Narrative   Divorced    Review of Systems: General: Negative for anorexia, weight loss, fever, chills, fatigue, weakness. ENT: Negative for hoarseness, difficulty swallowing , nasal congestion. CV: Negative for chest pain, angina, palpitations, dyspnea on exertion, peripheral edema.  Respiratory: Negative for dyspnea at rest, dyspnea on exertion, cough, sputum, wheezing.  GI: See history of present illness. MS: Negative for joint pain, low back pain.  Derm: Negative for rash or itching.  Endo: Negative for unusual weight change.  Heme: Negative for bruising or bleeding. Allergy: Negative for rash or hives.    Physical Exam: BP (!) 146/82   Pulse 62   Temp (!) 97 F (36.1 C) (Oral)   Ht 5\' 6"  (1.676 m)   Wt 217 lb 12.8 oz (98.8 kg)   BMI 35.15 kg/m  General:   Obese male. Alert and oriented. Pleasant and cooperative. Well-nourished and well-developed.  Head:  Normocephalic and atraumatic. Eyes:  Without icterus, sclera clear and conjunctiva pink.  Ears:  Normal auditory acuity. Cardiovascular:  S1, S2 present without murmurs appreciated. Extremities without clubbing or edema. Respiratory:  Clear to auscultation bilaterally. No wheezes, rales, or rhonchi. No distress.  Gastrointestinal:  +BS, rounded but soft, non-tender and non-distended. No HSM noted. No guarding or rebound. No masses appreciated.  Rectal:  Deferred  Musculoskalatal:  Symmetrical without gross  deformities. Neurologic:  Alert and oriented x4;  grossly normal neurologically. Psych:  Alert and cooperative. Normal mood and affect. Heme/Lymph/Immune: No excessive bruising noted.    08/02/2017 10:06 AM   Disclaimer: This note was dictated with voice recognition software. Similar sounding words can inadvertently be transcribed and may not be corrected upon review.

## 2017-08-02 NOTE — Patient Instructions (Addendum)
1. Continue taking MiraLAX every day. 2. I have sent in a prescription of Anusol rectal cream for when you have itching and irritation in your rectum.  You can use this for up to 10 days at a time as needed. 3. Return for follow-up as needed for any more rectal bleeding. 4. Call us if you have any questions or concerns.

## 2017-08-02 NOTE — Assessment & Plan Note (Signed)
Patient has chronic constipation.  His constipation is very well managed on MiraLAX.  However, he does not like taking it every day.  He is asking for a brief/one-time treatment to fix his constipation.  I discussed that there is generally no medications or treatments that can indefinitely fix constipation.  If MiraLAX manages his symptoms well continue taking MiraLAX.  This will prevent worsening constipation which seems to be the cause of his scant toilet tissue hematochezia.  Return for follow-up as needed for any further rectal bleeding.

## 2017-08-02 NOTE — Assessment & Plan Note (Signed)
History of radiation proctitis.  Does have constipation.  Has had one episode of scant toilet tissue hematochezia while significantly constipated.  He does not like taking MiraLAX every day.  Recommended he continue MiraLAX to prevent constipation as when he has not constipated he does not have any rectal bleeding.  Return for follow-up for any further rectal bleeding noted.  He is due for colonoscopy in 2024.

## 2017-08-02 NOTE — Assessment & Plan Note (Signed)
Patient notes intermittent itching and irritation in his rectum, specifically when he is constipated.  He likely has hemorrhoids as a possible source of his symptoms as well as his rectal bleeding scant toilet tissue hematochezia while significantly constipated.  I will send in Anusol rectal cream for him.  Follow-up as needed for any further rectal bleeding.

## 2017-08-02 NOTE — Progress Notes (Signed)
CC'D TO PCP °

## 2017-12-14 ENCOUNTER — Ambulatory Visit: Payer: BC Managed Care – PPO | Admitting: Urology

## 2017-12-14 DIAGNOSIS — R351 Nocturia: Secondary | ICD-10-CM

## 2017-12-14 DIAGNOSIS — N5201 Erectile dysfunction due to arterial insufficiency: Secondary | ICD-10-CM | POA: Diagnosis not present

## 2017-12-14 DIAGNOSIS — R9721 Rising PSA following treatment for malignant neoplasm of prostate: Secondary | ICD-10-CM | POA: Diagnosis not present

## 2017-12-14 DIAGNOSIS — Z8546 Personal history of malignant neoplasm of prostate: Secondary | ICD-10-CM

## 2018-02-08 ENCOUNTER — Ambulatory Visit: Payer: BC Managed Care – PPO | Admitting: Podiatry

## 2018-03-26 ENCOUNTER — Other Ambulatory Visit: Payer: Self-pay

## 2018-03-26 ENCOUNTER — Encounter (HOSPITAL_COMMUNITY): Payer: Self-pay

## 2018-03-26 ENCOUNTER — Emergency Department (HOSPITAL_COMMUNITY)
Admission: EM | Admit: 2018-03-26 | Discharge: 2018-03-26 | Disposition: A | Discharge status: Home / Self Care | Payer: BC Managed Care – PPO | Attending: Emergency Medicine | Admitting: Emergency Medicine

## 2018-03-26 DIAGNOSIS — Z7982 Long term (current) use of aspirin: Secondary | ICD-10-CM | POA: Diagnosis not present

## 2018-03-26 DIAGNOSIS — Z79899 Other long term (current) drug therapy: Secondary | ICD-10-CM | POA: Insufficient documentation

## 2018-03-26 DIAGNOSIS — Z87891 Personal history of nicotine dependence: Secondary | ICD-10-CM | POA: Diagnosis not present

## 2018-03-26 DIAGNOSIS — I10 Essential (primary) hypertension: Secondary | ICD-10-CM

## 2018-03-26 DIAGNOSIS — Z8546 Personal history of malignant neoplasm of prostate: Secondary | ICD-10-CM | POA: Diagnosis not present

## 2018-03-26 LAB — BASIC METABOLIC PANEL
Anion gap: 6 (ref 5–15)
BUN: 10 mg/dL (ref 8–23)
CO2: 32 mmol/L (ref 22–32)
Calcium: 9.2 mg/dL (ref 8.9–10.3)
Chloride: 104 mmol/L (ref 98–111)
Creatinine, Ser: 0.88 mg/dL (ref 0.61–1.24)
GFR calc Af Amer: >60 mL/min (ref >60)
GFR calc non Af Amer: >60 mL/min (ref >60)
Glucose, Bld: 104 mg/dL — ABNORMAL HIGH (ref 70–99)
Potassium: 3.1 mmol/L — ABNORMAL LOW (ref 3.5–5.1)
Sodium: 142 mmol/L (ref 135–145)

## 2018-03-26 LAB — CBC WITH DIFFERENTIAL/PLATELET
Basophils Absolute: 0 10*3/uL (ref 0.0–0.1)
Basophils Relative: 0 %
Eosinophils Absolute: 0.2 10*3/uL (ref 0.0–0.7)
Eosinophils Relative: 4 %
HCT: 42.4 % (ref 39.0–52.0)
Hemoglobin: 14.2 g/dL (ref 13.0–17.0)
Lymphocytes Relative: 43 %
Lymphs Abs: 2 10*3/uL (ref 0.7–4.0)
MCH: 29.5 pg (ref 26.0–34.0)
MCHC: 33.5 g/dL (ref 30.0–36.0)
MCV: 88 fL (ref 78.0–100.0)
Monocytes Absolute: 0.4 10*3/uL (ref 0.1–1.0)
Monocytes Relative: 9 %
Neutro Abs: 2 10*3/uL (ref 1.7–7.7)
Neutrophils Relative %: 44 %
Platelets: 220 10*3/uL (ref 150–400)
RBC: 4.82 MIL/uL (ref 4.22–5.81)
RDW: 13 % (ref 11.5–15.5)
WBC: 4.6 10*3/uL (ref 4.0–10.5)

## 2018-03-26 LAB — TROPONIN I: Troponin I: <0.03 ng/mL (ref <0.03)

## 2018-03-26 MED ORDER — POTASSIUM CHLORIDE CRYS ER 20 MEQ PO TBCR
40.0000 meq | EXTENDED_RELEASE_TABLET | Freq: Once | ORAL | Status: AC
  Administered 2018-03-26: 40 meq via ORAL
  Filled 2018-03-26: qty 2

## 2018-03-26 NOTE — Discharge Instructions (Signed)
Keep log of blood pressure once daily when not stressed or in pain. Take medications regularly and reassess with pcp.  Return for chest pain, shortness of breath, stroke symptoms or new / concerning symptoms.

## 2018-03-26 NOTE — ED Triage Notes (Signed)
Pt reports waking up and feeling a little dizzy, so he checked his  BP and it read 200/140. Pt says he feels a "little wobbly still, but feeling better and not as dizzy." Pt reports his Dr recently switched his BP medication from amlodipine to lisinopril. Pt says he took lisinopril yesterday, and took an amlodipine before coming to ED.

## 2018-04-02 NOTE — ED Provider Notes (Signed)
[facility] EMERGENCY DEPARTMENT Provider Note   CSN: [id number] Arrival date & time: 03/26/18  0451     History   Chief Complaint Chief Complaint  Patient presents with  . Hypertension    150/82    HPI Steven [name] is a 67 y.o. male.  Patient presents for evaluation from elevated blood pressure 678 systolic.  Patient felt a little dizzy however currently no significant symptoms.  Patient has seen his doctor recently and had a change his blood pressure medication from amlodipine to lisinopril and he feels he might have to go back.  Currently no chest pain or shortness of breath.  No stroke symptoms.  Patient has history of high blood pressure and past cigarette smoker.     Past Medical History:  Diagnosis Date  . Arthritis   . Cancer [facility])    prostate s/p radiation Mar 2013  . Chest pain   . Fracture 08/17/2015   MULTIPLE RIB FRACTURES     FROM FALL   . History of radiation therapy 08/22/11-10/13/11   prostate  . Hyperlipemia   . Hypertension     Patient Active Problem List   Diagnosis Date Noted  . Constipation 08/02/2017  . Hemorrhoids 08/02/2017  . Fall 08/18/2015  . Open fracture of left elbow 08/18/2015  . Rib fractures 08/17/2015  . Radiation proctitis 01/30/2013  . Rectal bleeding 12/10/2012  . Chest pain 05/30/2012  . Elevated lipids 05/30/2012  . Palpitations 05/30/2012  . Dyspnea 05/30/2012  . Obese 05/30/2012  . Prostate cancer ([place]) 08/13/2011    Past Surgical History:  Procedure Laterality Date  . COLONOSCOPY N/A 12/19/2012   LFY:BOFBPZ 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 Normal appearing colon  . KIDNEY SURGERY  1982        Home Medications    Prior to Admission medications   Medication Sig Start Date End Date Taking? Authorizing Provider  amLODipine (NORVASC) 10 MG tablet Take 10 mg by mouth daily.    [provider]  aspirin 81 MG tablet Take 81 mg by mouth  daily.    [provider]  hydrochlorothiazide (HYDRODIURIL) 25 MG tablet Take 25 mg by mouth daily.    [provider]  hydrocortisone (ANUSOL-HC) 2.5 % rectal cream Place 1 application rectally 2 (two) times daily as needed for hemorrhoids or anal itching. up to 10 days at a time for rectal symptoms 08/02/17   Carlis Stable, NP  polyethylene glycol (MIRALAX / GLYCOLAX) packet Take 17 g by mouth daily. Patient taking differently: Take 17 g by mouth as needed.  08/19/15   Lisette Abu, PA-C  Potassium (POTASSIMIN PO) Take 1 tablet by mouth daily.    [provider]  VITAMIN E PO Take 1 tablet by mouth daily.    [provider]    Family History Family History  Problem Relation Age of Onset  . Colon cancer Neg Hx     Social History Social History   Tobacco Use  . Smoking status: Former Smoker    Packs/day: 1.00    Years: 20.00    Pack years: 20.00    Types: Cigarettes    Last attempt to quit: 07/13/2011    Years since quitting: 6.7  . Smokeless tobacco: Current User  . Tobacco comment: Quit smoking x 2 years    07/2015   SOMETIIMES i USE VAPOR   Substance Use Topics  . Alcohol use: No  Comment: rarely  . Drug use: No     Allergies   Patient has no known allergies.   Review of Systems Review of Systems  Constitutional: Negative for chills and fever.  HENT: Negative for congestion.   Eyes: Negative for visual disturbance.  Respiratory: Negative for shortness of breath.   Cardiovascular: Negative for chest pain.  Gastrointestinal: Negative for abdominal pain and vomiting.  Genitourinary: Negative for dysuria and flank pain.  Musculoskeletal: Negative for back pain, neck pain and neck stiffness.  Skin: Negative for rash.  Neurological: Positive for dizziness. Negative for light-headedness and headaches.     Physical Exam Updated Vital Signs BP (!) 150/86 (BP Location: Right Arm)   Pulse (!) 52   Temp 98.1 F (36.7 C) (Oral)    Resp 14   Ht 5\' 6"  (1.676 m)   Wt 98.9 kg   SpO2 99%   BMI 35.19 kg/m   Physical Exam  Constitutional: He is oriented to person, place, and time. He appears well-developed and well-nourished.  HENT:  Head: Normocephalic and atraumatic.  Eyes: Conjunctivae are normal. Right eye exhibits no discharge. Left eye exhibits no discharge.  Neck: Normal range of motion. Neck supple. No tracheal deviation present.  Cardiovascular: Normal rate and regular rhythm.  Pulmonary/Chest: Effort normal and breath sounds normal.  Abdominal: Soft. He exhibits no distension. There is no tenderness. There is no guarding.  Musculoskeletal: He exhibits no edema.  Neurological: He is alert and oriented to person, place, and time. No cranial nerve deficit.  Skin: Skin is warm. No rash noted.  Psychiatric: He has a normal mood and affect.  Nursing note and vitals reviewed.    ED Treatments / Results  Labs (all labs ordered are listed, but only abnormal results are displayed) Labs Reviewed  BASIC METABOLIC PANEL - Abnormal; Notable for the following components:      Result Value   Potassium 3.1 (*)    Glucose, Bld 104 (*)    All other components within normal limits  CBC WITH DIFFERENTIAL/PLATELET  TROPONIN I    EKG EKG Interpretation  Date/Time:  Tuesday March 26 2018 06:27:28 EDT Ventricular Rate:  56 PR Interval:    QRS Duration: 96 QT Interval:  433 QTC Calculation: 418 R Axis:   16 Text Interpretation:  Sinus rhythm No previous ECGs available Confirmed by Ezequiel Essex [phone]) on 03/26/2018 6:40:51 AM   Radiology No results found.  Procedures Procedures (including critical care time)  Medications Ordered in ED Medications  potassium chloride SA (K-DUR,KLOR-CON) CR tablet 40 mEq (40 mEq Oral Given 03/26/18 0803)     Initial Impression / Assessment and Plan / ED Course  I have reviewed the triage vital signs and the nursing notes.  Pertinent labs & imaging results that  were available during my care of the patient were reviewed by me and considered in my medical decision making (see chart for details).    Patient presents with blood pressure more elevated than typical.  Patient blood pressure improved in the ER with observation.  Patient has screening blood work done including normal blood work/troponin, K mild low,  EKG no acute findings however no old to compare.  Patient comfortable to close outpatient follow-up. No stroke sxs, cp or sob.   Results and differential diagnosis were discussed with the patient/parent/guardian. Xrays were independently reviewed by myself.  Close follow up outpatient was discussed, comfortable with the plan.   Medications  potassium chloride SA (K-DUR,KLOR-CON) CR tablet 40 mEq (40  mEq Oral Given 03/26/18 0803)    Vitals:   03/26/18 0615 03/26/18 0630 03/26/18 0700 03/26/18 0841  BP:  (!) 151/75 (!) 142/74 (!) 150/86  Pulse: (!) 57 (!) 55 (!) 51 (!) 52  Resp:  19 (!) 28 14  Temp:      TempSrc:      SpO2: 100% 99% 98% 99%  Weight:      Height:        Final diagnoses:  Essential hypertension     Final Clinical Impressions(s) / ED Diagnoses   Final diagnoses:  Essential hypertension    ED Discharge Orders    None       Elnora Morrison, MD 04/02/18 0201

## 2018-06-14 ENCOUNTER — Ambulatory Visit: Payer: BC Managed Care – PPO | Admitting: Urology

## 2018-06-14 DIAGNOSIS — R9721 Rising PSA following treatment for malignant neoplasm of prostate: Secondary | ICD-10-CM | POA: Diagnosis not present

## 2018-06-14 DIAGNOSIS — N5201 Erectile dysfunction due to arterial insufficiency: Secondary | ICD-10-CM

## 2018-06-14 DIAGNOSIS — R351 Nocturia: Secondary | ICD-10-CM | POA: Diagnosis not present

## 2018-06-14 DIAGNOSIS — Z8546 Personal history of malignant neoplasm of prostate: Secondary | ICD-10-CM

## 2018-09-30 ENCOUNTER — Other Ambulatory Visit (HOSPITAL_COMMUNITY): Payer: Self-pay | Admitting: Internal Medicine

## 2018-09-30 ENCOUNTER — Ambulatory Visit (HOSPITAL_COMMUNITY)
Admission: RE | Admit: 2018-09-30 | Discharge: 2018-09-30 | Disposition: A | Discharge status: Home / Self Care | Payer: BC Managed Care – PPO | Source: Ambulatory Visit | Attending: Internal Medicine | Admitting: Internal Medicine

## 2018-09-30 DIAGNOSIS — M542 Cervicalgia: Secondary | ICD-10-CM

## 2018-12-20 ENCOUNTER — Ambulatory Visit (INDEPENDENT_AMBULATORY_CARE_PROVIDER_SITE_OTHER): Payer: BC Managed Care – PPO | Admitting: Urology

## 2018-12-20 DIAGNOSIS — N5201 Erectile dysfunction due to arterial insufficiency: Secondary | ICD-10-CM

## 2018-12-20 DIAGNOSIS — Z8546 Personal history of malignant neoplasm of prostate: Secondary | ICD-10-CM | POA: Diagnosis not present

## 2018-12-20 DIAGNOSIS — R9721 Rising PSA following treatment for malignant neoplasm of prostate: Secondary | ICD-10-CM

## 2019-02-11 ENCOUNTER — Telehealth: Payer: Self-pay | Admitting: Internal Medicine

## 2019-02-11 DIAGNOSIS — K649 Unspecified hemorrhoids: Secondary | ICD-10-CM

## 2019-02-11 MED ORDER — HYDROCORTISONE (PERIANAL) 2.5 % EX CREA: 1.0000 "application " | TOPICAL_CREAM | Freq: Two times a day (BID) | CUTANEOUS | 2 refills | Status: DC

## 2019-02-11 NOTE — Telephone Encounter (Signed)
Pt would like a refill on his Anusol Hc 2.5 % Cream sent to [facility].

## 2019-02-11 NOTE — Telephone Encounter (Signed)
Pt notified that Rx was sent into his pharmacy.

## 2019-02-11 NOTE — Telephone Encounter (Signed)
[phone]  PLEASE CALL PATIENT,  HE WANTS TO KNOW IF WE CAN CALL HIM IN SOMETHING FOR HIS HEMORRHOIDS,  HAVING IRRITATION  SAID HE USES [facility]

## 2019-02-11 NOTE — Telephone Encounter (Signed)
Rx sent to pharmacy   

## 2019-02-11 NOTE — Addendum Note (Signed)
Addended by: Gordy Levan, ERIC A on: 02/11/2019 12:44 PM   Modules accepted: Orders

## 2019-03-01 ENCOUNTER — Observation Stay (HOSPITAL_COMMUNITY)
Admission: EM | Admit: 2019-03-01 | Discharge: 2019-03-02 | Disposition: A | Discharge status: Home / Self Care | Payer: BC Managed Care – PPO | Attending: Internal Medicine | Admitting: Internal Medicine

## 2019-03-01 ENCOUNTER — Other Ambulatory Visit: Payer: Self-pay

## 2019-03-01 ENCOUNTER — Encounter (HOSPITAL_COMMUNITY): Payer: Self-pay | Admitting: Emergency Medicine

## 2019-03-01 ENCOUNTER — Emergency Department (HOSPITAL_COMMUNITY): Payer: BC Managed Care – PPO

## 2019-03-01 DIAGNOSIS — R7989 Other specified abnormal findings of blood chemistry: Secondary | ICD-10-CM

## 2019-03-01 DIAGNOSIS — Z20828 Contact with and (suspected) exposure to other viral communicable diseases: Secondary | ICD-10-CM | POA: Diagnosis not present

## 2019-03-01 DIAGNOSIS — I482 Chronic atrial fibrillation, unspecified: Secondary | ICD-10-CM | POA: Diagnosis present

## 2019-03-01 DIAGNOSIS — I4892 Unspecified atrial flutter: Secondary | ICD-10-CM | POA: Diagnosis present

## 2019-03-01 DIAGNOSIS — Z7982 Long term (current) use of aspirin: Secondary | ICD-10-CM | POA: Insufficient documentation

## 2019-03-01 DIAGNOSIS — I10 Essential (primary) hypertension: Secondary | ICD-10-CM | POA: Insufficient documentation

## 2019-03-01 DIAGNOSIS — I480 Paroxysmal atrial fibrillation: Principal | ICD-10-CM | POA: Insufficient documentation

## 2019-03-01 DIAGNOSIS — R778 Other specified abnormalities of plasma proteins: Secondary | ICD-10-CM

## 2019-03-01 DIAGNOSIS — E876 Hypokalemia: Secondary | ICD-10-CM | POA: Diagnosis present

## 2019-03-01 DIAGNOSIS — Z8546 Personal history of malignant neoplasm of prostate: Secondary | ICD-10-CM | POA: Diagnosis not present

## 2019-03-01 DIAGNOSIS — Z79899 Other long term (current) drug therapy: Secondary | ICD-10-CM | POA: Insufficient documentation

## 2019-03-01 DIAGNOSIS — R42 Dizziness and giddiness: Secondary | ICD-10-CM | POA: Diagnosis present

## 2019-03-01 DIAGNOSIS — F17210 Nicotine dependence, cigarettes, uncomplicated: Secondary | ICD-10-CM | POA: Insufficient documentation

## 2019-03-01 LAB — TROPONIN I (HIGH SENSITIVITY)
Troponin I (High Sensitivity): 56 ng/L — ABNORMAL HIGH (ref <18)
Troponin I (High Sensitivity): 74 ng/L — ABNORMAL HIGH (ref <18)

## 2019-03-01 LAB — CBC WITH DIFFERENTIAL/PLATELET
Abs Immature Granulocytes: 0.01 10*3/uL (ref 0.00–0.07)
Basophils Absolute: 0 10*3/uL (ref 0.0–0.1)
Basophils Relative: 1 %
Eosinophils Absolute: 0.1 10*3/uL (ref 0.0–0.5)
Eosinophils Relative: 1 %
HCT: 49.3 % (ref 39.0–52.0)
Hemoglobin: 15.9 g/dL (ref 13.0–17.0)
Immature Granulocytes: 0 %
Lymphocytes Relative: 36 %
Lymphs Abs: 1.8 10*3/uL (ref 0.7–4.0)
MCH: 28.8 pg (ref 26.0–34.0)
MCHC: 32.3 g/dL (ref 30.0–36.0)
MCV: 89.2 fL (ref 80.0–100.0)
Monocytes Absolute: 0.3 10*3/uL (ref 0.1–1.0)
Monocytes Relative: 6 %
Neutro Abs: 2.8 10*3/uL (ref 1.7–7.7)
Neutrophils Relative %: 56 %
Platelets: 229 10*3/uL (ref 150–400)
RBC: 5.53 MIL/uL (ref 4.22–5.81)
RDW: 12.8 % (ref 11.5–15.5)
WBC: 4.9 10*3/uL (ref 4.0–10.5)
nRBC: 0 % (ref 0.0–0.2)

## 2019-03-01 LAB — COMPREHENSIVE METABOLIC PANEL
ALT: 22 U/L (ref 0–44)
AST: 25 U/L (ref 15–41)
Albumin: 4.4 g/dL (ref 3.5–5.0)
Alkaline Phosphatase: 60 U/L (ref 38–126)
Anion gap: 11 (ref 5–15)
BUN: 10 mg/dL (ref 8–23)
CO2: 26 mmol/L (ref 22–32)
Calcium: 9.8 mg/dL (ref 8.9–10.3)
Chloride: 105 mmol/L (ref 98–111)
Creatinine, Ser: 0.93 mg/dL (ref 0.61–1.24)
GFR calc Af Amer: >60 mL/min (ref >60)
GFR calc non Af Amer: >60 mL/min (ref >60)
Glucose, Bld: 88 mg/dL (ref 70–99)
Potassium: 3.3 mmol/L — ABNORMAL LOW (ref 3.5–5.1)
Sodium: 142 mmol/L (ref 135–145)
Total Bilirubin: 1 mg/dL (ref 0.3–1.2)
Total Protein: 8 g/dL (ref 6.5–8.1)

## 2019-03-01 LAB — D-DIMER, QUANTITATIVE: D-Dimer, Quant: <0.27 ug/mL-FEU (ref 0.00–0.50)

## 2019-03-01 MED ORDER — ACETAMINOPHEN 325 MG PO TABS: 650.0000 mg | ORAL_TABLET | Freq: Four times a day (QID) | ORAL | Status: DC | PRN

## 2019-03-01 MED ORDER — POTASSIUM CHLORIDE IN NACL 20-0.9 MEQ/L-% IV SOLN
INTRAVENOUS | Status: DC
  Filled 2019-03-01: qty 1000

## 2019-03-01 MED ORDER — ACETAMINOPHEN 650 MG RE SUPP: 650.0000 mg | Freq: Four times a day (QID) | RECTAL | Status: DC | PRN

## 2019-03-01 NOTE — ED Provider Notes (Signed)
[facility] EMERGENCY DEPARTMENT Provider Note   CSN: [id number] Arrival date & time: 03/01/19  1524    History   Chief Complaint Chief Complaint  Patient presents with   Shortness of Breath    HPI Steven [name] is a 68 y.o. male.  Who presents emergency department chief complaint of lightheadedness and shortness of breath.  Patient states that he was "piddling around in the yard" this afternoon.  He sat down to help his brother thread a weedeater.  He states that when he sat up he suddenly felt very lightheaded like he was going to pass out and got very short of breath.  He says that the symptoms lasted for about 10 minutes.  He went inside to cool off, drink some cold water and put a cold rag on his forehead.  He states that this is is worried him and he came in.  He denies racing heart palpitations, melena, hematochezia.  He states that he was very hot sweaty and thought maybe he just got overheated.  He denies use of blood thinner.  He has a past medical history of prostate cancer, hypertension, hyper hyperlipidemia.     HPI  Past Medical History:  Diagnosis Date   Arthritis    Cancer [facility])    prostate s/p radiation Mar 2013   Chest pain    Fracture 08/17/2015   MULTIPLE RIB FRACTURES     FROM FALL    History of radiation therapy 08/22/11-10/13/11   prostate   Hyperlipemia    Hypertension     Patient Active Problem List   Diagnosis Date Noted   Constipation 08/02/2017   Hemorrhoids 08/02/2017   Fall 08/18/2015   Open fracture of left elbow 08/18/2015   Rib fractures 08/17/2015   Radiation proctitis 01/30/2013   Rectal bleeding 12/10/2012   Chest pain 05/30/2012   Elevated lipids 05/30/2012   Palpitations 05/30/2012   Dyspnea 05/30/2012   Obese 05/30/2012   Prostate cancer ([place]) 08/13/2011    Past Surgical History:  Procedure Laterality Date   COLONOSCOPY N/A 12/19/2012   XMI:WOEHOZ bleeding secondary to radiation induced proctitis  -  status post APC ablation; internal hemorrhoids. Normal appearing colon   KIDNEY SURGERY  1982        Home Medications    Prior to Admission medications   Medication Sig Start Date End Date Taking? Authorizing Provider  amLODipine (NORVASC) 10 MG tablet Take 10 mg by mouth daily.   Yes [provider]  atorvastatin (LIPITOR) 20 MG tablet Take 1 tablet by mouth daily. 12/17/18  Yes [provider]  hydrocortisone (ANUSOL-HC) 2.5 % rectal cream Place 1 application rectally 2 (two) times daily. Use up to 10 days at a time. 02/11/19  Yes Carlis Stable, NP  losartan-hydrochlorothiazide (HYZAAR) 100-25 MG tablet Take 1 tablet by mouth daily. 02/11/19  Yes [provider]  potassium chloride SA (K-DUR) 20 MEQ tablet Take 1 tablet by mouth daily. 10/29/18  Yes [provider]  VITAMIN E PO Take 1 tablet by mouth daily.   Yes [provider]  aspirin 81 MG tablet Take 81 mg by mouth daily.    [provider]  hydrochlorothiazide (HYDRODIURIL) 25 MG tablet Take 25 mg by mouth daily.    [provider]  polyethylene glycol (MIRALAX / GLYCOLAX) packet Take 17 g by mouth daily. Patient taking differently: Take 17 g by mouth as needed.  08/19/15   Lisette Abu, PA-C    Family History  Family History  Problem Relation Age of Onset   Colon cancer Neg Hx     Social History Social History   Tobacco Use   Smoking status: Current Every Day Smoker    Packs/day: 1.00    Years: 20.00    Pack years: 20.00    Types: Cigarettes   Smokeless tobacco: Former Systems developer   Tobacco comment: Quit smoking x 2 years    07/2015   SOMETIIMES i USE VAPOR   Substance Use Topics   Alcohol use: Yes    Comment: rarely   Drug use: No     Allergies   Patient has no known allergies.   Review of Systems Review of Systems  Ten systems reviewed and are negative for acute change, except as noted in the HPI.   Physical Exam Updated Vital Signs BP  (!) 150/87    Pulse 73    Temp 98.1 F (36.7 C) (Oral)    Resp 15    Ht 5\' 6"  (1.676 m)    Wt 98.9 kg    SpO2 99%    BMI 35.19 kg/m   Physical Exam Vitals signs and nursing note reviewed.  Constitutional:      General: He is not in acute distress.    Appearance: He is well-developed. He is not diaphoretic.  HENT:     Head: Normocephalic and atraumatic.  Eyes:     General: No scleral icterus.    Conjunctiva/sclera: Conjunctivae normal.  Neck:     Musculoskeletal: Normal range of motion and neck supple.  Cardiovascular:     Rate and Rhythm: Normal rate and regular rhythm.     Heart sounds: Normal heart sounds.  Pulmonary:     Effort: Pulmonary effort is normal. No respiratory distress.     Breath sounds: Normal breath sounds.  Abdominal:     Palpations: Abdomen is soft.     Tenderness: There is no abdominal tenderness.  Skin:    General: Skin is warm and dry.  Neurological:     Mental Status: He is alert.  Psychiatric:        Behavior: Behavior normal.      ED Treatments / Results  Labs (all labs ordered are listed, but only abnormal results are displayed) Labs Reviewed  COMPREHENSIVE METABOLIC PANEL - Abnormal; Notable for the following components:      Result Value   Potassium 3.3 (*)    All other components within normal limits  TROPONIN I (HIGH SENSITIVITY) - Abnormal; Notable for the following components:   Troponin I (High Sensitivity) 56 (*)    All other components within normal limits  CBC WITH DIFFERENTIAL/PLATELET  TROPONIN I (HIGH SENSITIVITY)    EKG None   ED ECG REPORT   Date: 03/01/2019  Rate: 138  Rhythm: sinus tachycardia  QRS Axis: normal  Intervals: normal  ST/T Wave abnormalities: normal  Conduction Disutrbances:none  Narrative Interpretation:   Old EKG Reviewed: changes noted   I have personally reviewed the EKG tracing and disagree with the computerized printout which reads 2:1 A-V block with A flutter as P waves are visible as  noted.  Radiology Dg Chest 2 View  Result Date: 03/01/2019 CLINICAL DATA:  Shortness of breath. EXAM: CHEST - 2 VIEW COMPARISON:  August 17, 2015 FINDINGS: The heart size and mediastinal contours are within normal limits. Both lungs are clear. The visualized skeletal structures are unremarkable. IMPRESSION: No active cardiopulmonary disease. Electronically Signed   By: Jamie Kato.D.  On: 03/01/2019 16:46    Procedures Procedures (including critical care time)  Medications Ordered in ED Medications - No data to display   Initial Impression / Assessment and Plan / ED Course  I have reviewed the triage vital signs and the nursing notes.  Pertinent labs & imaging results that were available during my care of the patient were reviewed by me and considered in my medical decision making (see chart for details).        BX:[id number] and SOB VS: HDS- tachycardic- resolved without intervention XJ:DBZMCEY is gathered by patietn  and EMR. DDX:The emergent differential diagnosis for shortness of breath includes, but is not limited to, Pulmonary edema, bronchoconstriction, Pneumonia, Pulmonary embolism, Pneumotherax/ Hemothorax, Dysrythmia, ACS.  Labs: I reviewed the labs which show elevated troponin needing delta Imaging: I personally reviewed the images (2 view chest x-ray) which show(s) no acute abnormalities EKG: EKG showed sinus tachycardia MDM: Patient here with symptoms concerning for cardiac event.  He has a slightly elevated troponin and will need a second.  Have given signout to Dr. Lacinda Axon will assume care of the patient    Final Clinical Impressions(s) / ED Diagnoses   Final diagnoses:  Elevated troponin    ED Discharge Orders    None       Margarita Mail, PA-C 03/05/19 1205    Nat Christen, MD 03/17/19 1553

## 2019-03-01 NOTE — ED Triage Notes (Signed)
Patient c/o brief episode shortness of breath and dizziness after mowing grass today. Patient states episode lasted approx 10 minutes. Per patient sat in air conditioner and drank water. Patient states felt better afterwards and denies any shortness of breath or dizziness now. Patient states "I was just concerned and wanted to get checked out."

## 2019-03-01 NOTE — ED Notes (Signed)
ekg done in triage and handed to Dr. Lacinda Axon

## 2019-03-01 NOTE — ED Provider Notes (Signed)
Delta troponin minimally elevated.  Discussed care with cardiology fellow at [facility].  Will admit to observation.  Can always transfer to [facility] if symptomatology worsens.  Discussed with Dr. Maudie Mercury.   Nat Christen, MD 03/01/19 [phone]

## 2019-03-02 ENCOUNTER — Observation Stay (HOSPITAL_BASED_OUTPATIENT_CLINIC_OR_DEPARTMENT_OTHER): Payer: BC Managed Care – PPO

## 2019-03-02 ENCOUNTER — Observation Stay (HOSPITAL_COMMUNITY): Payer: BC Managed Care – PPO

## 2019-03-02 DIAGNOSIS — I482 Chronic atrial fibrillation, unspecified: Secondary | ICD-10-CM | POA: Diagnosis present

## 2019-03-02 DIAGNOSIS — R7989 Other specified abnormal findings of blood chemistry: Secondary | ICD-10-CM | POA: Diagnosis not present

## 2019-03-02 DIAGNOSIS — R42 Dizziness and giddiness: Secondary | ICD-10-CM | POA: Diagnosis present

## 2019-03-02 DIAGNOSIS — I480 Paroxysmal atrial fibrillation: Secondary | ICD-10-CM | POA: Diagnosis not present

## 2019-03-02 DIAGNOSIS — E876 Hypokalemia: Secondary | ICD-10-CM

## 2019-03-02 DIAGNOSIS — Z7982 Long term (current) use of aspirin: Secondary | ICD-10-CM | POA: Diagnosis not present

## 2019-03-02 DIAGNOSIS — I4891 Unspecified atrial fibrillation: Secondary | ICD-10-CM | POA: Diagnosis not present

## 2019-03-02 DIAGNOSIS — I10 Essential (primary) hypertension: Secondary | ICD-10-CM | POA: Diagnosis not present

## 2019-03-02 DIAGNOSIS — Z8546 Personal history of malignant neoplasm of prostate: Secondary | ICD-10-CM | POA: Diagnosis not present

## 2019-03-02 DIAGNOSIS — F17210 Nicotine dependence, cigarettes, uncomplicated: Secondary | ICD-10-CM | POA: Diagnosis not present

## 2019-03-02 DIAGNOSIS — I4892 Unspecified atrial flutter: Secondary | ICD-10-CM | POA: Diagnosis present

## 2019-03-02 DIAGNOSIS — Z20828 Contact with and (suspected) exposure to other viral communicable diseases: Secondary | ICD-10-CM | POA: Diagnosis not present

## 2019-03-02 DIAGNOSIS — Z79899 Other long term (current) drug therapy: Secondary | ICD-10-CM | POA: Diagnosis not present

## 2019-03-02 LAB — CBC
HCT: 43.7 % (ref 39.0–52.0)
Hemoglobin: 14.1 g/dL (ref 13.0–17.0)
MCH: 29.3 pg (ref 26.0–34.0)
MCHC: 32.3 g/dL (ref 30.0–36.0)
MCV: 90.7 fL (ref 80.0–100.0)
Platelets: 223 10*3/uL (ref 150–400)
RBC: 4.82 MIL/uL (ref 4.22–5.81)
RDW: 12.6 % (ref 11.5–15.5)
WBC: 4.7 10*3/uL (ref 4.0–10.5)
nRBC: 0 % (ref 0.0–0.2)

## 2019-03-02 LAB — ECHOCARDIOGRAM COMPLETE
Height: 66 in
Weight: 3488 oz

## 2019-03-02 LAB — COMPREHENSIVE METABOLIC PANEL
ALT: 20 U/L (ref 0–44)
AST: 20 U/L (ref 15–41)
Albumin: 3.6 g/dL (ref 3.5–5.0)
Alkaline Phosphatase: 51 U/L (ref 38–126)
Anion gap: 7 (ref 5–15)
BUN: 11 mg/dL (ref 8–23)
CO2: 26 mmol/L (ref 22–32)
Calcium: 9.1 mg/dL (ref 8.9–10.3)
Chloride: 108 mmol/L (ref 98–111)
Creatinine, Ser: 0.76 mg/dL (ref 0.61–1.24)
GFR calc Af Amer: >60 mL/min (ref >60)
GFR calc non Af Amer: >60 mL/min (ref >60)
Glucose, Bld: 117 mg/dL — ABNORMAL HIGH (ref 70–99)
Potassium: 3.1 mmol/L — ABNORMAL LOW (ref 3.5–5.1)
Sodium: 141 mmol/L (ref 135–145)
Total Bilirubin: 0.6 mg/dL (ref 0.3–1.2)
Total Protein: 6.8 g/dL (ref 6.5–8.1)

## 2019-03-02 LAB — URINALYSIS, ROUTINE W REFLEX MICROSCOPIC
Bilirubin Urine: NEGATIVE
Glucose, UA: NEGATIVE mg/dL
Hgb urine dipstick: NEGATIVE
Ketones, ur: NEGATIVE mg/dL
Leukocytes,Ua: NEGATIVE
Nitrite: NEGATIVE
Protein, ur: NEGATIVE mg/dL
Specific Gravity, Urine: 1.019 (ref 1.005–1.030)
pH: 6 (ref 5.0–8.0)

## 2019-03-02 LAB — TSH: TSH: 0.843 u[IU]/mL (ref 0.350–4.500)

## 2019-03-02 LAB — MAGNESIUM: Magnesium: 2 mg/dL (ref 1.7–2.4)

## 2019-03-02 LAB — SARS CORONAVIRUS 2 (TAT 6-24 HRS): SARS Coronavirus 2: NEGATIVE

## 2019-03-02 LAB — LIPID PANEL
Cholesterol: 142 mg/dL (ref 0–200)
HDL: 32 mg/dL — ABNORMAL LOW (ref >40)
LDL Cholesterol: 92 mg/dL (ref 0–99)
Total CHOL/HDL Ratio: 4.4 RATIO
Triglycerides: 89 mg/dL (ref <150)
VLDL: 18 mg/dL (ref 0–40)

## 2019-03-02 LAB — TROPONIN I (HIGH SENSITIVITY)
Troponin I (High Sensitivity): 37 ng/L — ABNORMAL HIGH (ref <18)
Troponin I (High Sensitivity): 28 ng/L — ABNORMAL HIGH (ref <18)

## 2019-03-02 MED ORDER — RIVAROXABAN 20 MG PO TABS
20.0000 mg | ORAL_TABLET | Freq: Every day | ORAL | Status: DC
  Administered 2019-03-02: 20 mg via ORAL
  Filled 2019-03-02: qty 1

## 2019-03-02 MED ORDER — POLYETHYLENE GLYCOL 3350 17 G PO PACK
17.0000 g | PACK | Freq: Every day | ORAL | Status: DC
  Administered 2019-03-02: 17 g via ORAL
  Filled 2019-03-02: qty 1

## 2019-03-02 MED ORDER — ATORVASTATIN CALCIUM 80 MG PO TABS
80.0000 mg | ORAL_TABLET | Freq: Every day | ORAL | Status: DC
  Administered 2019-03-02: 80 mg via ORAL
  Filled 2019-03-02: qty 1

## 2019-03-02 MED ORDER — POLYETHYLENE GLYCOL 3350 17 G PO PACK: 17.0000 g | PACK | ORAL | Status: DC | PRN

## 2019-03-02 MED ORDER — PERFLUTREN LIPID MICROSPHERE
1.0000 mL | INTRAVENOUS | Status: AC | PRN
  Administered 2019-03-02: 2 mL via INTRAVENOUS
  Filled 2019-03-02: qty 10

## 2019-03-02 MED ORDER — POTASSIUM CHLORIDE CRYS ER 20 MEQ PO TBCR
20.0000 meq | EXTENDED_RELEASE_TABLET | Freq: Every day | ORAL | Status: DC
  Administered 2019-03-02: 20 meq via ORAL
  Filled 2019-03-02: qty 1

## 2019-03-02 MED ORDER — METOPROLOL TARTRATE 25 MG PO TABS: 12.5000 mg | ORAL_TABLET | Freq: Two times a day (BID) | ORAL | 1 refills | Status: DC

## 2019-03-02 MED ORDER — ENOXAPARIN SODIUM 100 MG/ML ~~LOC~~ SOLN
1.0000 mg/kg | Freq: Once | SUBCUTANEOUS | Status: AC
  Administered 2019-03-02: 100 mg via SUBCUTANEOUS
  Filled 2019-03-02: qty 1

## 2019-03-02 MED ORDER — LOSARTAN POTASSIUM 50 MG PO TABS
100.0000 mg | ORAL_TABLET | Freq: Every day | ORAL | Status: DC
  Administered 2019-03-02: 100 mg via ORAL
  Filled 2019-03-02: qty 2

## 2019-03-02 MED ORDER — LOSARTAN POTASSIUM-HCTZ 100-25 MG PO TABS: 1.0000 | ORAL_TABLET | Freq: Every day | ORAL | Status: DC

## 2019-03-02 MED ORDER — HYDRALAZINE HCL 20 MG/ML IJ SOLN: 5.0000 mg | Freq: Four times a day (QID) | INTRAMUSCULAR | Status: DC | PRN

## 2019-03-02 MED ORDER — RIVAROXABAN (XARELTO) EDUCATION KIT FOR AFIB PATIENTS
PACK | Freq: Once | Status: AC
  Administered 2019-03-02: 17:00:00
  Filled 2019-03-02: qty 1

## 2019-03-02 MED ORDER — POTASSIUM CHLORIDE CRYS ER 20 MEQ PO TBCR
40.0000 meq | EXTENDED_RELEASE_TABLET | ORAL | Status: AC
  Administered 2019-03-02 (×2): 40 meq via ORAL
  Filled 2019-03-02 (×2): qty 2

## 2019-03-02 MED ORDER — RIVAROXABAN 20 MG PO TABS: 20.0000 mg | ORAL_TABLET | Freq: Every day | ORAL | 1 refills | Status: DC

## 2019-03-02 MED ORDER — AMLODIPINE BESYLATE 10 MG PO TABS
10.0000 mg | ORAL_TABLET | Freq: Every day | ORAL | Status: DC
  Filled 2019-03-02: qty 1

## 2019-03-02 MED ORDER — ASPIRIN 81 MG PO CHEW
81.0000 mg | CHEWABLE_TABLET | Freq: Every day | ORAL | Status: DC
  Administered 2019-03-02: 08:00:00 81 mg via ORAL
  Filled 2019-03-02: qty 1

## 2019-03-02 MED ORDER — HYDROCHLOROTHIAZIDE 25 MG PO TABS
25.0000 mg | ORAL_TABLET | Freq: Every day | ORAL | Status: DC
  Administered 2019-03-02: 25 mg via ORAL
  Filled 2019-03-02: qty 1

## 2019-03-02 MED ORDER — METOPROLOL TARTRATE 12.5 MG HALF TABLET
12.5000 mg | ORAL_TABLET | Freq: Two times a day (BID) | ORAL | Status: DC
  Administered 2019-03-02: 12.5 mg via ORAL
  Filled 2019-03-02 (×2): qty 1

## 2019-03-02 NOTE — ED Notes (Signed)
Pt very agitated. Sates that he keeps getting different answers from everyone but states we are "all on point when it comes to getting his money". Was in room to collect pt signature for transport to [facility] and to inform him of wait time for transport as [facility] stated they were going to be "tied up for next several hrs". Pt states first doctor told him he was going to admit him, second doctor stated he had to go to [facility]  Which I explained was d/t no Cardiology here. Pt kept on reverting back to money and how we are sure to collect his insurance card, etc.. Pt even angry because no one asked him to remove his shoes for the "6 hrs he has been here". Charge nurse made aware of pt's concerns.

## 2019-03-02 NOTE — Progress Notes (Signed)
Asked by Dr Grandville Silos to discuss anticoagulation with patient He is hesitant to start His CHADVASC is 2 He was unaware of PAF and in retrospect thinks he's Had episodes like this "dizziness" before He has no contraindications to anticoagulation   This patients CHA2DS2-VASc Score and unadjusted Ischemic Stroke Rate (% per year) is equal to 2.2 % stroke rate/year from a score of 2  Above score calculated as 1 point each if present [CHF, HTN, DM, Vascular=MI/PAD/Aortic Plaque, Age if 65-74, or Male] Above score calculated as 2 points each if present [Age > 75, or Stroke/TIA/TE]  Alternative would be to get 21 day monitor and document further PAF but  His next episode could result in stroke   Willing to start DOAC. Prefers once daily dosing Will start [place] CT negative  Jenkins Rouge

## 2019-03-02 NOTE — Progress Notes (Signed)
  Echocardiogram 2D Echocardiogram has been performed.  Jennette Dubin 03/02/2019, 1:21 PM

## 2019-03-02 NOTE — Discharge Instructions (Signed)
Information on my medicine - XARELTO (Rivaroxaban)  This medication education was reviewed with me or my healthcare representative as part of my discharge preparation.  The pharmacist that spoke with me during my hospital stay was:  Minda Ditto, [facility]  Why was Xarelto prescribed for you? Xarelto was prescribed for you to reduce the risk of a blood clot forming that can cause a stroke if you have a medical condition called atrial fibrillation (a type of irregular heartbeat).  What do you need to know about xarelto ? Take your Xarelto ONCE DAILY at the same time every day with your evening meal. If you have difficulty swallowing the tablet whole, you may crush it and mix in applesauce just prior to taking your dose.  Take Xarelto exactly as prescribed by your doctor and DO NOT stop taking Xarelto without talking to the doctor who prescribed the medication.  Stopping without other stroke prevention medication to take the place of Xarelto may increase your risk of developing a clot that causes a stroke.  Refill your prescription before you run out.  After discharge, you should have regular check-up appointments with your healthcare provider that is prescribing your Xarelto.  In the future your dose may need to be changed if your kidney function or weight changes by a significant amount.  What do you do if you miss a dose? If you are taking Xarelto ONCE DAILY and you miss a dose, take it as soon as you remember on the same day then continue your regularly scheduled once daily regimen the next day. Do not take two doses of Xarelto at the same time or on the same day.   Important Safety Information A possible side effect of Xarelto is bleeding. You should call your healthcare provider right away if you experience any of the following: ? Bleeding from an injury or your nose that does not stop. ? Unusual colored urine (red or dark brown) or unusual colored stools (red or black). ? Unusual  bruising for unknown reasons. ? A serious fall or if you hit your head (even if there is no bleeding).  Some medicines may interact with Xarelto and might increase your risk of bleeding while on Xarelto. To help avoid this, consult your healthcare provider or pharmacist prior to using any new prescription or non-prescription medications, including herbals, vitamins, non-steroidal anti-inflammatory drugs (NSAIDs) and supplements.  Patient states he takes large dose of Vitamin E, I suggested reducing to 400 units/day or discontinue Vitamin E to avoid interaction with Xarelto  This website has more information on Xarelto: [email or web address]

## 2019-03-02 NOTE — ED Notes (Signed)
ED TO INPATIENT HANDOFF REPORT  ED Nurse Name and Phone #:  Antony Blackbird., RN [phone]  S Name/Age/Gender Steven [name] 68 y.o. male Room/Bed: APA04/APA04  Code Status   Code Status: Full Code  Home/SNF/Other Home Patient oriented to: self, place, time and situation Is this baseline? Yes   Triage Complete: Triage complete  Chief Complaint Feels Faint (since yesterday)  Triage Note Patient c/o brief episode shortness of breath and dizziness after mowing grass today. Patient states episode lasted approx 10 minutes. Per patient sat in air conditioner and drank water. Patient states felt better afterwards and denies any shortness of breath or dizziness now. Patient states "I was just concerned and wanted to get checked out."   Allergies No Known Allergies  Level of Care/Admitting Diagnosis ED Disposition    ED Disposition Condition [place]: [place] [100100]  Level of Care: Telemetry Cardiac [103]  I expect the patient will be discharged within 24 hours: No (not a candidate for 5C-Observation unit)  Covid Evaluation: Asymptomatic Screening Protocol (No Symptoms)  Diagnosis: Elevated troponin [315400]  Admitting Physician: Jani Gravel [[id number]]  Attending Physician: Jani Gravel [[id number]]  PT Class (Do Not Modify): Observation [104]  PT Acc Code (Do Not Modify): Observation [10022]       B Medical/Surgery History Past Medical History:  Diagnosis Date  . Arthritis   . Cancer [facility])    prostate s/p radiation Mar 2013  . Chest pain   . Fracture 08/17/2015   MULTIPLE RIB FRACTURES     FROM FALL   . History of radiation therapy 08/22/11-10/13/11   prostate  . Hyperlipemia   . Hypertension    Past Surgical History:  Procedure Laterality Date  . 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 Normal appearing colon  . [place]  .  RADIOACTIVE SEED IMPLANT     Prostate     A IV Location/Drains/Wounds Patient Lines/Drains/Airways Status   Active Line/Drains/Airways    Name:   Placement date:   Placement time:   Site:   Days:   Peripheral IV 03/02/19 Left Forearm   03/02/19    0108    Forearm   less than 1          Intake/Output Last 24 hours  Intake/Output Summary (Last 24 hours) at 03/02/2019 0330 Last data filed at 03/02/2019 0159 Gross per 24 hour  Intake 64.61 ml  Output -  Net 64.61 ml    Labs/Imaging Results for orders placed or performed during the hospital encounter of 03/01/19 (from the past 48 hour(s))  CBC with Differential     Status: None   Collection Time: 03/01/19  5:24 PM  Result Value Ref Range   WBC 4.9 4.0 - 10.5 K/uL   RBC 5.53 4.22 - 5.81 MIL/uL   Hemoglobin 15.9 13.0 - 17.0 g/dL   HCT 49.3 39.0 - 52.0 %   MCV 89.2 80.0 - 100.0 fL   MCH 28.8 26.0 - 34.0 pg   MCHC 32.3 30.0 - 36.0 g/dL   RDW 12.8 11.5 - 15.5 %   Platelets 229 150 - 400 K/uL   nRBC 0.0 0.0 - 0.2 %   Neutrophils Relative % 56 %   Neutro Abs 2.8 1.7 - 7.7 K/uL   Lymphocytes Relative 36 %   Lymphs Abs 1.8 0.7 - 4.0 K/uL   Monocytes Relative 6 %  Monocytes Absolute 0.3 0.1 - 1.0 K/uL   Eosinophils Relative 1 %   Eosinophils Absolute 0.1 0.0 - 0.5 K/uL   Basophils Relative 1 %   Basophils Absolute 0.0 0.0 - 0.1 K/uL   Immature Granulocytes 0 %   Abs Immature Granulocytes 0.01 0.00 - 0.07 K/uL    Comment: Performed at [facility], [address] [place], [place] [address]  Comprehensive metabolic panel     Status: Abnormal   Collection Time: 03/01/19  5:24 PM  Result Value Ref Range   Sodium 142 135 - 145 mmol/L   Potassium 3.3 (L) 3.5 - 5.1 mmol/L   Chloride 105 98 - 111 mmol/L   CO2 26 22 - 32 mmol/L   Glucose, Bld 88 70 - 99 mg/dL   BUN 10 8 - 23 mg/dL   Creatinine, Ser 0.93 0.61 - 1.24 mg/dL   Calcium 9.8 8.9 - 10.3 mg/dL   Total Protein 8.0 6.5 - 8.1 g/dL   Albumin 4.4 3.5 - 5.0 g/dL   AST 25 15 -  41 U/L   ALT 22 0 - 44 U/L   Alkaline Phosphatase 60 38 - 126 U/L   Total Bilirubin 1.0 0.3 - 1.2 mg/dL   GFR calc non Af Amer >60 >60 mL/min   GFR calc Af Amer >60 >60 mL/min   Anion gap 11 5 - 15    Comment: Performed at [facility], [address] [place], [place] [address]  Troponin I (High Sensitivity)     Status: Abnormal   Collection Time: 03/01/19  5:24 PM  Result Value Ref Range   Troponin I (High Sensitivity) 56 (H) <18 ng/L    Comment: (NOTE) Elevated high sensitivity troponin I (hsTnI) values and significant  changes across serial measurements may suggest ACS but many other  chronic and acute conditions are known to elevate hsTnI results.  Refer to the "Links" section for chest pain algorithms and additional  guidance. Performed at [facility], [address], [place], [place] [address]   D-dimer, quantitative (not at [facility])     Status: None   Collection Time: 03/01/19  5:24 PM  Result Value Ref Range   D-Dimer, Quant <0.27 0.00 - 0.50 ug/mL-FEU    Comment: (NOTE) At the manufacturer cut-off of 0.50 ug/mL FEU, this assay has been documented to exclude PE with a sensitivity and negative predictive value of 97 to 99%.  At this time, this assay has not been approved by the FDA to exclude DVT/VTE. Results should be correlated with clinical presentation. Performed at [facility], [address], [place], [place] [address]   Troponin I (High Sensitivity)     Status: Abnormal   Collection Time: 03/01/19  9:28 PM  Result Value Ref Range   Troponin I (High Sensitivity) 74 (H) <18 ng/L    Comment: (NOTE) Elevated high sensitivity troponin I (hsTnI) values and significant  changes across serial measurements may suggest ACS but many other  chronic and acute conditions are known to elevate hsTnI results.  Refer to the "Links" section for chest pain algorithms and additional  guidance. Performed at [facility], [address] [place], [place] [address]   Urinalysis, Routine  w reflex microscopic     Status: Abnormal   Collection Time: 03/02/19 12:35 AM  Result Value Ref Range   Color, Urine YELLOW YELLOW   APPearance HAZY (A) CLEAR   Specific Gravity, Urine 1.019 1.005 - 1.030   pH 6.0 5.0 - 8.0  Glucose, UA NEGATIVE NEGATIVE mg/dL   Hgb urine dipstick NEGATIVE NEGATIVE   Bilirubin Urine NEGATIVE NEGATIVE   Ketones, ur NEGATIVE NEGATIVE mg/dL   Protein, ur NEGATIVE NEGATIVE mg/dL   Nitrite NEGATIVE NEGATIVE   Leukocytes,Ua NEGATIVE NEGATIVE    Comment: Performed at [facility], [address] [place], [place] [address]   Dg Chest 2 View  Result Date: 03/01/2019 CLINICAL DATA:  Shortness of breath. EXAM: CHEST - 2 VIEW COMPARISON:  August 17, 2015 FINDINGS: The heart size and mediastinal contours are within normal limits. Both lungs are clear. The visualized skeletal structures are unremarkable. IMPRESSION: No active cardiopulmonary disease. Electronically Signed   By: Constance Holster M.D.   On: 03/01/2019 16:46    Pending Labs Unresulted Labs (From admission, onward)    Start     Ordered   03/02/19 0500  Comprehensive metabolic panel  Tomorrow morning,   R     03/01/19 2313   03/02/19 0500  CBC  Tomorrow morning,   R     03/01/19 2313   03/02/19 0500  Lipid panel  Tomorrow morning,   R     03/02/19 0042   03/01/19 2312  HIV antibody (Routine Testing)  Once,   STAT     03/01/19 2313   03/01/19 2309  SARS CORONAVIRUS 2 Nasal Swab Aptima Multi Swab  (Asymptomatic/Tier 2 Patients Labs)  Once,   STAT    Question Answer Comment  Is this test for diagnosis or screening Screening   Symptomatic for COVID-19 as defined by CDC No   Hospitalized for COVID-19 No   Admitted to ICU for COVID-19 No   Previously tested for COVID-19 No   Resident in a congregate (group) care setting No   Employed in healthcare setting No      03/01/19 2311          Vitals/Pain Today's Vitals   03/02/19 0130 03/02/19 0200 03/02/19 0230 03/02/19 0300  BP: (!) 153/90  (!) 152/93 (!) 146/85 133/79  Pulse: (!) 59 (!) 57 (!) 56 (!) 56  Resp: 14 13 16 17   Temp:      TempSrc:      SpO2: 99% 100% 99% 100%  Weight:      Height:      PainSc:        Isolation Precautions No active isolations  Medications Medications  0.9 % NaCl with KCl 20 mEq/ L  infusion ( Intravenous Rate/Dose Verify 03/02/19 0159)  acetaminophen (TYLENOL) tablet 650 mg (has no administration in time range)    Or  acetaminophen (TYLENOL) suppository 650 mg (has no administration in time range)  metoprolol tartrate (LOPRESSOR) tablet 12.5 mg (12.5 mg Oral Given 03/02/19 0058)  hydrALAZINE (APRESOLINE) injection 5 mg (has no administration in time range)  polyethylene glycol (MIRALAX / GLYCOLAX) packet 17 g (has no administration in time range)  aspirin tablet 81 mg (has no administration in time range)  amLODipine (NORVASC) tablet 10 mg (has no administration in time range)  losartan-hydrochlorothiazide (HYZAAR) 100-25 MG per tablet 1 tablet (has no administration in time range)  potassium chloride SA (K-DUR) CR tablet 20 mEq (has no administration in time range)  atorvastatin (LIPITOR) tablet 80 mg (80 mg Oral Not Given 03/02/19 0056)  enoxaparin (LOVENOX) injection 100 mg (100 mg Subcutaneous Given 03/02/19 0058)    Mobility walks Low fall risk   Focused Assessments    R Recommendations: See Admitting Provider Note  Report given to:   Additional Notes:

## 2019-03-02 NOTE — H&P (Signed)
TRH H&P    Patient Demographics:    Steven [name], is a 68 y.o. male  MRN: [id number]  DOB - 1950-09-04  Admit Date - 03/01/2019  Referring MD/NP/PA:  Nat Christen  Outpatient Primary MD for the patient is Jake Samples, PA-C  Patient coming from: home  Chief complaint- dizziness   HPI:    Steven [name]  is a 68 y.o. male,  w hypertension, hyperlipidemia, apparently presents with c/o dizziness "woozy"  this afternoon.  He was working outdoors when it occurred. It occurred while he was trying to stand up.  Pt went indoors and sat down, episode of dizziness lasted about 5 minutes.  Pt was concerned and thus presented to ED. Pt notes slight dry cough at time.    Pt denies vertigo, fever, chills, chest pain, palp, n/v, abd pain, diarrhea, brbpr, black stool , dysuria, hematuria.   Pt noted slight headache after a nurse got him upset ?  In ED,  T 98.1, P 79 R 21, Bp 157/98  Pox 96% on RA  CXR IMPRESSION: No active cardiopulmonary disease.  12 lead  EKG at 4pm 03/01/2019  => ? Aflutter at 138, borderline LAD,  ST depression in 2, 3, avf, and v5,6  Called cardiology to review EKG, they asked me to review the rhythm strip and apparently it didn't start recording til 5:13pm at which time the patient was in sinus rhythm at 80.   Cardiology fellow thinks that he is in [place].   Pt will be admitted for dizziness, (not vertigo), as well as possible aflutter    Review of systems:    In addition to the HPI above,  No Fever-chills, No Headache, No changes with Vision or hearing, No problems swallowing food or Liquids, No Chest pain, Cough or Shortness of Breath, No Abdominal pain, No Nausea or Vomiting, bowel movements are regular, No Blood in stool or Urine, No dysuria, No new skin rashes or bruises, No new joints pains-aches,  No new weakness, tingling, numbness in any extremity, No recent weight  gain or loss, No polyuria, polydypsia or polyphagia, No significant Mental Stressors.  All other systems reviewed and are negative.    Past History of the following :    Past Medical History:  Diagnosis Date  . Arthritis   . Cancer [facility])    prostate s/p radiation Mar 2013  . Chest pain   . Fracture 08/17/2015   MULTIPLE RIB FRACTURES     FROM FALL   . History of radiation therapy 08/22/11-10/13/11   prostate  . Hyperlipemia   . Hypertension       Past Surgical History:  Procedure Laterality Date  . 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 Normal appearing colon  . [place]  . RADIOACTIVE SEED IMPLANT     Prostate      Social History:      Social History   Tobacco Use  . Smoking status: Current Every  Day Smoker    Packs/day: 1.00    Years: 20.00    Pack years: 20.00    Types: Cigarettes  . Smokeless tobacco: Former Systems developer  . Tobacco comment: Quit smoking x 2 years    07/2015   SOMETIIMES i USE VAPOR   Substance Use Topics  . Alcohol use: Yes    Comment: rarely       Family History :     Family History  Problem Relation Age of Onset  . Aneurysm Mother   . Prostate cancer Father   . Colon cancer Neg Hx        Home Medications:   Prior to Admission medications   Medication Sig Start Date End Date Taking? Authorizing Provider  amLODipine (NORVASC) 10 MG tablet Take 10 mg by mouth daily.   Yes [provider]  atorvastatin (LIPITOR) 20 MG tablet Take 1 tablet by mouth daily. 12/17/18  Yes [provider]  hydrocortisone (ANUSOL-HC) 2.5 % rectal cream Place 1 application rectally 2 (two) times daily. Use up to 10 days at a time. 02/11/19  Yes Carlis Stable, NP  losartan-hydrochlorothiazide (HYZAAR) 100-25 MG tablet Take 1 tablet by mouth daily. 02/11/19  Yes [provider]  potassium chloride SA (K-DUR) 20 MEQ tablet Take 1  tablet by mouth daily. 10/29/18  Yes [provider]  VITAMIN E PO Take 1 tablet by mouth daily.   Yes [provider]  aspirin 81 MG tablet Take 81 mg by mouth daily.    [provider]  hydrochlorothiazide (HYDRODIURIL) 25 MG tablet Take 25 mg by mouth daily.    [provider]  polyethylene glycol (MIRALAX / GLYCOLAX) packet Take 17 g by mouth daily. Patient taking differently: Take 17 g by mouth as needed.  08/19/15   Lisette Abu, PA-C     Allergies:    No Known Allergies   Physical Exam:   Vitals  Blood pressure (!) 146/90, pulse 69, temperature 98.1 F (36.7 C), temperature source Oral, resp. rate 13, height 5\' 6"  (1.676 m), weight 98.9 kg, SpO2 100 %.  1.  General: axoxo3  2. Psychiatric: euthymic  3. Neurologic: cn2-12 intact, reflexes 2+ symmetric, diffuse with no clonus, motor 5/5 in all 4 ext  4. HEENMT:  Anicteric, pupils 1.31mm symmetric, direct, consensual, near intact  5. Respiratory : CTAB  6. Cardiovascular : rrr s1, s2, no m/g/r  7. Gastrointestinal:  Abd: soft, nt, nd, +bs  8. Skin:  Ext: no c/c/e,  No rash  9.Musculoskeletal:  Good ROM,  No adenopathy    Data Review:    CBC Recent Labs  Lab 03/01/19 1724  WBC 4.9  HGB 15.9  HCT 49.3  PLT 229  MCV 89.2  MCH 28.8  MCHC 32.3  RDW 12.8  LYMPHSABS 1.8  MONOABS 0.3  EOSABS 0.1  BASOSABS 0.0   ------------------------------------------------------------------------------------------------------------------  Results for orders placed or performed during the hospital encounter of 03/01/19 (from the past 48 hour(s))  CBC with Differential     Status: None   Collection Time: 03/01/19  5:24 PM  Result Value Ref Range   WBC 4.9 4.0 - 10.5 K/uL   RBC 5.53 4.22 - 5.81 MIL/uL   Hemoglobin 15.9 13.0 - 17.0 g/dL   HCT 49.3 39.0 - 52.0 %   MCV 89.2 80.0 - 100.0 fL   MCH 28.8 26.0 - 34.0 pg   MCHC 32.3 30.0 - 36.0 g/dL   RDW 12.8 11.5 - 15.5 %  Platelets 229 150 - 400 K/uL   nRBC 0.0 0.0 - 0.2 %   Neutrophils Relative % 56 %   Neutro Abs 2.8 1.7 - 7.7 K/uL   Lymphocytes Relative 36 %   Lymphs Abs 1.8 0.7 - 4.0 K/uL   Monocytes Relative 6 %   Monocytes Absolute 0.3 0.1 - 1.0 K/uL   Eosinophils Relative 1 %   Eosinophils Absolute 0.1 0.0 - 0.5 K/uL   Basophils Relative 1 %   Basophils Absolute 0.0 0.0 - 0.1 K/uL   Immature Granulocytes 0 %   Abs Immature Granulocytes 0.01 0.00 - 0.07 K/uL    Comment: Performed at [facility], [address], [place], [place] [address]  Comprehensive metabolic panel     Status: Abnormal   Collection Time: 03/01/19  5:24 PM  Result Value Ref Range   Sodium 142 135 - 145 mmol/L   Potassium 3.3 (L) 3.5 - 5.1 mmol/L   Chloride 105 98 - 111 mmol/L   CO2 26 22 - 32 mmol/L   Glucose, Bld 88 70 - 99 mg/dL   BUN 10 8 - 23 mg/dL   Creatinine, Ser 0.93 0.61 - 1.24 mg/dL   Calcium 9.8 8.9 - 10.3 mg/dL   Total Protein 8.0 6.5 - 8.1 g/dL   Albumin 4.4 3.5 - 5.0 g/dL   AST 25 15 - 41 U/L   ALT 22 0 - 44 U/L   Alkaline Phosphatase 60 38 - 126 U/L   Total Bilirubin 1.0 0.3 - 1.2 mg/dL   GFR calc non Af Amer >60 >60 mL/min   GFR calc Af Amer >60 >60 mL/min   Anion gap 11 5 - 15    Comment: Performed at [facility], [address] [place], [place] [address]  Troponin I (High Sensitivity)     Status: Abnormal   Collection Time: 03/01/19  5:24 PM  Result Value Ref Range   Troponin I (High Sensitivity) 56 (H) <18 ng/L    Comment: (NOTE) Elevated high sensitivity troponin I (hsTnI) values and significant  changes across serial measurements may suggest ACS but many other  chronic and acute conditions are known to elevate hsTnI results.  Refer to the "Links" section for chest pain algorithms and additional  guidance. Performed at [facility], [address], [place], [place] [address]   D-dimer, quantitative (not at [facility])     Status: None   Collection Time: 03/01/19  5:24 PM  Result Value Ref Range    D-Dimer, Quant <0.27 0.00 - 0.50 ug/mL-FEU    Comment: (NOTE) At the manufacturer cut-off of 0.50 ug/mL FEU, this assay has been documented to exclude PE with a sensitivity and negative predictive value of 97 to 99%.  At this time, this assay has not been approved by the FDA to exclude DVT/VTE. Results should be correlated with clinical presentation. Performed at [facility], [address], [place], [place] [address]   Troponin I (High Sensitivity)     Status: Abnormal   Collection Time: 03/01/19  9:28 PM  Result Value Ref Range   Troponin I (High Sensitivity) 74 (H) <18 ng/L    Comment: (NOTE) Elevated high sensitivity troponin I (hsTnI) values and significant  changes across serial measurements may suggest ACS but many other  chronic and acute conditions are known to elevate hsTnI results.  Refer to the "Links" section for chest pain algorithms and additional  guidance. Performed at [facility], [address], [place], [place] [address]  Chemistries  Recent Labs  Lab 03/01/19 1724  NA 142  K 3.3*  CL 105  CO2 26  GLUCOSE 88  BUN 10  CREATININE 0.93  CALCIUM 9.8  AST 25  ALT 22  ALKPHOS 60  BILITOT 1.0   ------------------------------------------------------------------------------------------------------------------  ------------------------------------------------------------------------------------------------------------------ GFR: Estimated Creatinine Clearance: 84.8 mL/min (by C-G formula based on SCr of 0.93 mg/dL). Liver Function Tests: Recent Labs  Lab 03/01/19 1724  AST 25  ALT 22  ALKPHOS 60  BILITOT 1.0  PROT 8.0  ALBUMIN 4.4   No results for input(s): LIPASE, AMYLASE in the last 168 hours. No results for input(s): AMMONIA in the last 168 hours. Coagulation Profile: No results for input(s): INR, PROTIME in the last 168 hours. Cardiac Enzymes: No results for input(s): CKTOTAL, CKMB, CKMBINDEX, TROPONINI in the last 168 hours. BNP (last  3 results) No results for input(s): PROBNP in the last 8760 hours. HbA1C: No results for input(s): HGBA1C in the last 72 hours. CBG: No results for input(s): GLUCAP in the last 168 hours. Lipid Profile: No results for input(s): CHOL, HDL, LDLCALC, TRIG, CHOLHDL, LDLDIRECT in the last 72 hours. Thyroid Function Tests: No results for input(s): TSH, T4TOTAL, FREET4, T3FREE, THYROIDAB in the last 72 hours. Anemia Panel: No results for input(s): VITAMINB12, FOLATE, FERRITIN, TIBC, IRON, RETICCTPCT in the last 72 hours.  --------------------------------------------------------------------------------------------------------------- Urine analysis:    Component Value Date/Time   LABSPEC 1.020 08/03/2012 1156   PHURINE 7.0 08/03/2012 1156   GLUCOSEU NEGATIVE 08/03/2012 1156   HGBUR NEGATIVE 08/03/2012 1156   BILIRUBINUR NEGATIVE 08/03/2012 1156   KETONESUR NEGATIVE 08/03/2012 1156   PROTEINUR NEGATIVE 08/03/2012 1156   UROBILINOGEN 1.0 08/03/2012 1156   NITRITE NEGATIVE 08/03/2012 1156   LEUKOCYTESUR NEGATIVE 08/03/2012 1156      Imaging Results:    Dg Chest 2 View  Result Date: 03/01/2019 CLINICAL DATA:  Shortness of breath. EXAM: CHEST - 2 VIEW COMPARISON:  August 17, 2015 FINDINGS: The heart size and mediastinal contours are within normal limits. Both lungs are clear. The visualized skeletal structures are unremarkable. IMPRESSION: No active cardiopulmonary disease. Electronically Signed   By: Constance Holster M.D.   On: 03/01/2019 16:46       Assessment & Plan:    Active Problems:   Elevated troponin   Hypokalemia  ? Aflutter per cardiology fellow Tele Trop I in am Check TSH Check cardiac echo  Elevated troponin,  ? Ischemic heart disease  Repeat troponin  Check lipid Check cardiac echo as above Cont Aspirin Cont Losartan/ hydrochlorothiazide 100/25mg  po qday Cont Amlodipine 10mg  po qday Start metoprolol 12.5mg  po bid Increase Lipitor to 80mg  po qhs Please  consult cardiology in AM since unclear about when patient will [facility]  Hypokalemia Replete Check cmp in am  Hypertension Cont Losartan/ hydrochlorothiazide Cont Amlodipine Cont Metoprolol as above Hydralazine 5mg  iv q6h prn sbp >160      DVT Prophylaxis-   Lovenox  1mg / kg 1 time dose, - SCDs   AM Labs Ordered, also please review Full Orders  Family Communication: Admission, patients condition and plan of care including tests being ordered have been discussed with the patient  who indicate understanding and agree with the plan and Code Status.  Code Status:  FULL CODE<  Spoke with his phiance, that pt will be admitted to [facility] for observation  Admission status: Observation : Based on patients clinical presentation and evaluation of above clinical data, I have made determination that patient meets observation criteria at  this time.  Time spent in minutes : 70   Jani Gravel M.D on 03/02/2019 at 12:11 AM

## 2019-03-02 NOTE — Progress Notes (Addendum)
ANTICOAGULATION CONSULT NOTE - Initial Consult  Pharmacy Consult for Xarelto Indication: atrial fibrillation  No Known Allergies  Patient Measurements: Height: 5\' 6"  (167.6 cm) Weight: 218 lb (98.9 kg) IBW/kg (Calculated) : 63.8  Vital Signs: Temp: 98.4 F (36.9 C) (08/09 0437) Temp Source: Oral (08/09 0437) BP: 174/90 (08/09 0437) Pulse Rate: 62 (08/09 0437)  Labs: Recent Labs    03/01/19 1724 03/01/19 2128 03/02/19 0329 03/02/19 0441  HGB 15.9  --  14.1  --   HCT 49.3  --  43.7  --   PLT 229  --  223  --   CREATININE 0.93  --  0.76  --   TROPONINIHS 56* 74* 37* 28*    Estimated Creatinine Clearance: 98.6 mL/min (by C-G formula based on SCr of 0.76 mg/dL). CrCl with Total Body Weight ~ 132 mL/min  Medical History: Past Medical History:  Diagnosis Date  . Arthritis   . Cancer [facility])    prostate s/p radiation Mar 2013  . Chest pain   . Fracture 08/17/2015   MULTIPLE RIB FRACTURES     FROM FALL   . History of radiation therapy 08/22/11-10/13/11   prostate  . Hyperlipemia   . Hypertension     Medications:  Scheduled:  . amLODipine  10 mg Oral Daily  . atorvastatin  80 mg Oral q1800  . losartan  100 mg Oral Daily   And  . hydrochlorothiazide  25 mg Oral Daily  . metoprolol tartrate  12.5 mg Oral BID  . potassium chloride SA  20 mEq Oral Daily  . potassium chloride  40 mEq Oral Q4H  . rivaroxaban  20 mg Oral Q supper    Assessment: Pt  is a 68 y.o. male with PMH of hypertension, hyperlipidemia, who presented with c/o dizziness this afternoon. Pt noted to be in rapid a-fib/flutter (no anticoagulation PTA) in the setting of hypokalemia and elevated troponins. Pharmacy consulted to dose Xarelto for nonvalvular A-fib.   Pt's Hg/Hct are normal at 14.1/43.7. Plts are WNLs at 223. Pt has a CHA2DS2Vasc score of 2. Patient received one dose of lovenox 100 mg at 0130 on 08/09. Okay to start Xarelto later today.  Goal of Therapy:  Monitor platelets by  anticoagulation protocol: Yes   Plan:  Start Xarelto 20 mg PO Daily with supper. Continue to monitor CBC and signs/symptoms of bleeding  Sherren Kerns, PharmD PGY1 Acute Care Pharmacy Resident 03/02/2019,12:21 PM

## 2019-03-02 NOTE — Plan of Care (Signed)
Pt discharged to home  via private vehicle, verbal and printed education provided regarding atrial fibrilation, stroke prevention, and new medications (xarelto, metoprolol) verbal understanding received from patient

## 2019-03-02 NOTE — Discharge Summary (Signed)
Physician Discharge Summary  Steven [name] ZJQ:[id number] DOB: Apr 24, 1951 DOA: 03/01/2019  PCP: Jake Samples, PA-C  Admit date: 03/01/2019 Discharge date: 03/02/2019  Time spent: 55 minutes  Recommendations for Outpatient Follow-up:  1. Follow-up with [facility] heart care in [place] in 1 to 2 weeks for follow-up on paroxysmal atrial flutter/atrial fibrillation.  On follow-up patient will need a basic metabolic profile done to follow-up on electrolytes and renal function as well as a magnesium level. 2. Follow-up with Jake Samples, PA-C in 2 weeks.     Discharge Diagnoses:  Principal Problem:   Atrial flutter, paroxysmal (HCC) Active Problems:   Elevated troponin   Hypokalemia   HTN (hypertension)   Discharge Condition: Stable and improved.  Diet recommendation: Heart healthy  Filed Weights   03/01/19 1600 03/01/19 1603  Weight: 99.8 kg 98.9 kg    History of present illness:  HPI per Dr. Jani [name] Adkison  is a 68 y.o. male,  w hypertension, hyperlipidemia, apparently presents with c/o dizziness "woozy"  this afternoon.  He was working outdoors when it occurred. It occurred while he was trying to stand up.  Pt went indoors and sat down, episode of dizziness lasted about 5 minutes.  Pt was concerned and thus presented to ED. Pt notes slight dry cough at time.    Pt denies vertigo, fever, chills, chest pain, palp, n/v, abd pain, diarrhea, brbpr, black stool , dysuria, hematuria.   Pt noted slight headache after a nurse got him upset ?  In ED,  T 98.1, P 79 R 21, Bp 157/98  Pox 96% on RA  CXR IMPRESSION: No active cardiopulmonary disease.  12 lead  EKG at 4pm 03/01/2019  => ? Aflutter at 138, borderline LAD,  ST depression in 2, 3, avf, and v5,6  Called cardiology to review EKG, they asked me to review the rhythm strip and apparently it didn't start recording til 5:13pm at which time the patient was in sinus rhythm at 80.   Cardiology fellow thinks that he is  in [place].   Pt will be admitted for dizziness, (not vertigo), as well as possible aflutter  Hospital Course:  1 paroxysmal atrial flutter/atrial fibrillation with RVR CHA2VASC score 2 Patient admitted with complaints of dizziness and wooziness while working outdoors.  Patient presented to the ED and noted on EKG to be in atrial flutter with a rate of 138.  Cardiology was called to review EKG and strip and was felt patient likely in atrial flutter.  Patient was admitted for observation and further evaluation by cardiology.  Patient was placed on oral metoprolol as well as given a dose of Lovenox.  Patient was seen by cardiology and patient noted to have converted spontaneously back to normal sinus rhythm.  Head CT was done which was negative.  Was recommended that patient be started on anticoagulation due to increased risk of stroke.  2D echo which was obtained with a EF of 60 to 65%, no wall motion abnormalities, mildly thickening of mitral valve leaflet, tricuspid aortic valve.  Cardiology spoke with patient concerning anticoagulation and patient would prefer once daily dosing and as such Xarelto was recommended.  Patient received his first dose of Xarelto prior to discharge.  Patient remained in normal sinus rhythm.  Patient was cleared by cardiology for discharge.  Outpatient follow-up with cardiology.  2.  Hypokalemia Potassium repleted.  Outpatient follow-up.  3.  Hypertension Patient maintained on home regimen of losartan/HCTZ, Norvasc.  Metoprolol was added to patient's  regimen secondary to problem #1.  Blood pressure remained stable.  Outpatient follow-up.  4.  Elevated troponin Likely secondary to problem #1.  Troponins were flattened.  2D echo with normal EF with no wall motion abnormalities.  Patient was seen in consultation by cardiology.  Per cardiology patient with minimal to no chest pain.  Patient noted to have a normal stress echo in 2013.  No further ischemic work-up needed at  this time.  Procedures:  2D echo 03/02/2019  CT head 03/02/2019  Chest x-ray 03/01/2019  Consultations:  Cardiology: Dr. Johnsie Cancel 03/02/2019  Discharge Exam: Vitals:   03/02/19 0437 03/02/19 1244  BP: (!) 174/90 135/87  Pulse: 62 (!) 59  Resp: 20 20  Temp: 98.4 F (36.9 C) 97.6 F (36.4 C)  SpO2: 100% 100%    General: NAD Cardiovascular: RRR Respiratory: CTAB  Discharge Instructions   Discharge Instructions    Diet - low sodium heart healthy   Complete by: As directed    Increase activity slowly   Complete by: As directed      Allergies as of 03/02/2019   No Known Allergies     Medication List    STOP taking these medications   hydrochlorothiazide 25 MG tablet Commonly known as: HYDRODIURIL     TAKE these medications   amLODipine 10 MG tablet Commonly known as: NORVASC Take 10 mg by mouth daily.   aspirin 81 MG tablet Take 81 mg by mouth daily.   atorvastatin 20 MG tablet Commonly known as: LIPITOR Take 1 tablet by mouth daily.   hydrocortisone 2.5 % rectal cream Commonly known as: ANUSOL-HC Place 1 application rectally 2 (two) times daily. Use up to 10 days at a time.   losartan-hydrochlorothiazide 100-25 MG tablet Commonly known as: HYZAAR Take 1 tablet by mouth daily.   metoprolol tartrate 25 MG tablet Commonly known as: LOPRESSOR Take 0.5 tablets (12.5 mg total) by mouth 2 (two) times daily.   polyethylene glycol 17 g packet Commonly known as: MIRALAX / GLYCOLAX Take 17 g by mouth daily. What changed:   when to take this  reasons to take this   potassium chloride SA 20 MEQ tablet Commonly known as: K-DUR Take 1 tablet by mouth daily.   rivaroxaban 20 MG Tabs tablet Commonly known as: XARELTO Take 1 tablet (20 mg total) by mouth daily with supper.   VITAMIN E PO Take 1 tablet by mouth daily.      No Known Allergies Follow-up Information    Jake Samples, PA-C. Schedule an appointment as soon as possible for a visit in 2  week(s).   Specialty: Family Medicine Contact information: [address] [place] [address] [phone]        [place]. Schedule an appointment as soon as possible for a visit in 1 week(s).   Why: Follow-up in 1 to 2 weeks.  Office will call with appointment time. Contact information: [place] [address] [phone]           The results of significant diagnostics from this hospitalization (including imaging, microbiology, ancillary and laboratory) are listed below for reference.    Significant Diagnostic Studies: Dg Chest 2 View  Result Date: 03/01/2019 CLINICAL DATA:  Shortness of breath. EXAM: CHEST - 2 VIEW COMPARISON:  August 17, 2015 FINDINGS: The heart size and mediastinal contours are within normal limits. Both lungs are clear. The visualized skeletal structures are unremarkable. IMPRESSION: No active cardiopulmonary disease.  Electronically Signed   By: Constance Holster M.D.   On: 03/01/2019 16:46   Ct Head Wo Contrast  Result Date: 03/02/2019 CLINICAL DATA:  TIA.  Headache normal neuro exam EXAM: CT HEAD WITHOUT CONTRAST TECHNIQUE: Contiguous axial images were obtained from the base of the skull through the vertex without intravenous contrast. COMPARISON:  None. FINDINGS: Brain: No evidence of acute infarction, hemorrhage, hydrocephalus, extra-axial collection or mass lesion/mass effect. 11 mm cyst associated with the superior sagittal sinus posteriorly. This appears to be located posterior to the dural sinus and causes remodeling of the calvarium. No bony destruction. This has a benign appearance. Vascular: Negative for hyperdense vessel Skull: No acute abnormality Sinuses/Orbits: Negative Other: None IMPRESSION: No acute intracranial abnormality 11 mm benign-appearing cyst associated with the superior sagittal sinus posteriorly, consistent with arachnoid granulation.  Electronically Signed   By: Franchot Gallo M.D.   On: 03/02/2019 11:03    Microbiology: Recent Results (from the past 240 hour(s))  SARS CORONAVIRUS 2 Nasal Swab Aptima Multi Swab     Status: None   Collection Time: 03/01/19 11:15 PM   Specimen: Aptima Multi Swab; Nasal Swab  Result Value Ref Range Status   SARS Coronavirus 2 NEGATIVE NEGATIVE Final    Comment: (NOTE) SARS-CoV-2 target nucleic acids are NOT DETECTED. The SARS-CoV-2 RNA is generally detectable in upper and lower respiratory specimens during the acute phase of infection. Negative results do not preclude SARS-CoV-2 infection, do not rule out co-infections with other pathogens, and should not be used as the sole basis for treatment or other patient management decisions. Negative results must be combined with clinical observations, patient history, and epidemiological information. The expected result is Negative. Fact Sheet for Patients: SugarRoll.be Fact Sheet for Healthcare Providers: https://www.woods-mathews.com/ This test is not yet approved or cleared by the [place] FDA and  has been authorized for detection and/or diagnosis of SARS-CoV-2 by FDA under an Emergency Use Authorization (EUA). This EUA will remain  in effect (meaning this test can be used) for the duration of the COVID-19 declaration under Section 56 4(b)(1) of the Act, 21 U.S.C. section 360bbb-3(b)(1), unless the authorization is terminated or revoked sooner. Performed at [facility], [place] [address], [place], [place] [address]      Labs: Basic Metabolic Panel: Recent Labs  Lab 03/01/19 1724 03/02/19 0329 03/02/19 1000  NA 142 141  --   K 3.3* 3.1*  --   CL 105 108  --   CO2 26 26  --   GLUCOSE 88 117*  --   BUN 10 11  --   CREATININE 0.93 0.76  --   CALCIUM 9.8 9.1  --   MG  --   --  2.0   Liver Function Tests: Recent Labs  Lab 03/01/19 1724 03/02/19 0329  AST 25 20  ALT 22  20  ALKPHOS 60 51  BILITOT 1.0 0.6  PROT 8.0 6.8  ALBUMIN 4.4 3.6   No results for input(s): LIPASE, AMYLASE in the last 168 hours. No results for input(s): AMMONIA in the last 168 hours. CBC: Recent Labs  Lab 03/01/19 1724 03/02/19 0329  WBC 4.9 4.7  NEUTROABS 2.8  --   HGB 15.9 14.1  HCT 49.3 43.7  MCV 89.2 90.7  PLT 229 223   Cardiac Enzymes: No results for input(s): CKTOTAL, CKMB, CKMBINDEX, TROPONINI in the last 168 hours. BNP: BNP (last 3 results) No results for input(s): BNP in the last 8760 hours.  ProBNP (last  3 results) No results for input(s): PROBNP in the last 8760 hours.  CBG: No results for input(s): GLUCAP in the last 168 hours.     Signed:  Irine Seal MD.  [facility] 03/02/2019, 5:21 PM

## 2019-03-02 NOTE — Consult Note (Signed)
CARDIOLOGY CONSULT NOTE       Patient ID: Steven [name] MRN: [id number] DOB/AGE: 1950-09-11 68 y.o.  Admit date: 03/01/2019 Referring Physician: Grandville Silos Primary Physician: Scherrie Bateman Primary Cardiologist: Fidel Levy  Reason for Consultation: Flutter  Active Problems:   Elevated troponin   Hypokalemia   HPI:  68 y.o. obese black male History of HTN and HLD. Last seen by [place] in 2013 with normal stress echo Felt dizzy yesterday am. Got hot working outside. Lasted 5 minutes No chest pain palpitations or dyspnea No vertiginous symptoms Mild headache Noted to be in rapid fib/flutter Has since converted to NSR rates 60's with no long pauses. R/O CXR NAD K low at 3.1 and Troponin 56-74-37.  Post conversion ECG normal with no ischemic changes   This patients CHA2DS2-VASc Score and unadjusted Ischemic Stroke Rate (% per year) is equal to 2.2 % stroke rate/year from a score of 2  Above score calculated as 1 point each if present [CHF, HTN, DM, Vascular=MI/PAD/Aortic Plaque, Age if 65-74, or Male] Above score calculated as 2 points each if present [Age > 75, or Stroke/TIA/TE]   ROS All other systems reviewed and negative except as noted above  Past Medical History:  Diagnosis Date  . Arthritis   . [facility])    prostate s/p radiation Mar 2013  . Chest pain   . Fracture 08/17/2015   MULTIPLE RIB FRACTURES     FROM FALL   . History of radiation therapy 08/22/11-10/13/11   prostate  . Hyperlipemia   . Hypertension     Family History  Problem Relation Age of Onset  . Aneurysm Mother   . Prostate cancer Father   . Colon cancer Neg Hx     Social History   Socioeconomic History  . Marital status: Single    Spouse name: Not on file  . Number of children: Not on file  . Years of education: Not on file  . Highest education level: Not on file  Occupational History  . Occupation: Custodian     Employer: [employer]  Social Needs  . Financial  resource strain: Not on file  . Food insecurity    Worry: Not on file    Inability: Not on file  . Transportation needs    Medical: Not on file    Non-medical: Not on file  Tobacco Use  . Smoking status: Current Every Day Smoker    Packs/day: 1.00    Years: 20.00    Pack years: 20.00    Types: Cigarettes  . Smokeless tobacco: Former Systems developer  . Tobacco comment: Quit smoking x 2 years    07/2015   SOMETIIMES i USE VAPOR   Substance and Sexual Activity  . Alcohol use: Yes    Comment: rarely  . Drug use: No  . Sexual activity: Not on file  Lifestyle  . Physical activity    Days per week: Not on file    Minutes per session: Not on file  . Stress: Not on file  Relationships  . Social Herbalist on phone: Not on file    Gets together: Not on file    Attends religious service: Not on file    Active member of club or organization: Not on file    Attends meetings of clubs or organizations: Not on file    Relationship status: Not on file  . Intimate partner violence    Fear of current or ex partner:  Not on file    Emotionally abused: Not on file    Physically abused: Not on file    Forced sexual activity: Not on file  Other Topics Concern  . Not on file  Social History Narrative   Divorced    Past Surgical History:  Procedure Laterality Date  . 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 Normal appearing colon  . [place]  . RADIOACTIVE SEED IMPLANT     Prostate     . amLODipine  10 mg Oral Daily  . aspirin  81 mg Oral Daily  . atorvastatin  80 mg Oral q1800  . losartan  100 mg Oral Daily   And  . hydrochlorothiazide  25 mg Oral Daily  . metoprolol tartrate  12.5 mg Oral BID  . potassium chloride SA  20 mEq Oral Daily  . potassium chloride  40 mEq Oral Q4H     Physical Exam: Blood pressure (!) 174/90, pulse 62, temperature 98.4 F (36.9 C),  temperature source Oral, resp. rate 20, height 5\' 6"  (1.676 m), weight 98.9 kg, SpO2 100 %.    Affect appropriate Overweight black male  HEENT: normal Neck supple with no adenopathy JVP normal no bruits no thyromegaly Lungs clear with no wheezing and good diaphragmatic motion Heart:  S1/S2 no murmur, no rub, gallop or click PMI normal Abdomen: benighn, BS positve, no tenderness, no AAA no bruit.  No HSM or HJR Distal pulses intact with no bruits No edema Neuro non-focal Skin warm and dry No muscular weakness   Labs:   Lab Results  Component Value Date   WBC 4.7 03/02/2019   HGB 14.1 03/02/2019   HCT 43.7 03/02/2019   MCV 90.7 03/02/2019   PLT 223 03/02/2019    Recent Labs  Lab 03/02/19 0329  NA 141  K 3.1*  CL 108  CO2 26  BUN 11  CREATININE 0.76  CALCIUM 9.1  PROT 6.8  BILITOT 0.6  ALKPHOS 51  ALT 20  AST 20  GLUCOSE 117*   Lab Results  Component Value Date   TROPONINI <0.03 03/26/2018    Lab Results  Component Value Date   CHOL 142 03/02/2019   Lab Results  Component Value Date   HDL 32 (L) 03/02/2019   Lab Results  Component Value Date   LDLCALC 92 03/02/2019   Lab Results  Component Value Date   TRIG 89 03/02/2019   Lab Results  Component Value Date   CHOLHDL 4.4 03/02/2019   No results found for: LDLDIRECT    Radiology: Dg Chest 2 View  Result Date: 03/01/2019 CLINICAL DATA:  Shortness of breath. EXAM: CHEST - 2 VIEW COMPARISON:  August 17, 2015 FINDINGS: The heart size and mediastinal contours are within normal limits. Both lungs are clear. The visualized skeletal structures are unremarkable. IMPRESSION: No active cardiopulmonary disease. Electronically Signed   By: Constance Holster M.D.   On: 03/01/2019 16:46    EKG: see HPI  Rapid fib/flutter rates 130 -> NSR no acute changes    ASSESSMENT AND PLAN:   PAF:  CHADVASC 2 converted spontaneously started on lopressor start eliquis if CT head negative Would check this given  headache and r/o embolic event Echo pending for today Can have outpatient 21 day monitor to r/o further paroxysms. F/U cardiology in [place]   HTN:  Continue amlodipine, lopressor , losartan and HCTZ may need daily K  added. He indicates seeing primary [place] last week and BP was fine   Elevated Troponin:  Minimal no chest pain normal post conversion ECG normal stress echo 2013 no indication for stress testing unless echo shows poor EF   HLD:  LDL 92 no documented vascular disease continue lipitor   Probably ok for d/c in am if echo done and normal and telemetry no further episodes of PAF Have ordered head CT   Signed: Jenkins Rouge 03/02/2019, 9:54 AM

## 2019-03-03 LAB — HIV ANTIBODY (ROUTINE TESTING W REFLEX): HIV Screen 4th Generation wRfx: NONREACTIVE

## 2019-03-04 ENCOUNTER — Telehealth: Payer: Self-pay

## 2019-03-04 NOTE — Telephone Encounter (Signed)
Pt came to office. He has been using Anusol HC 2-3x/day. Hemorrhoids are some better, no bleeding that he has seen. Occ discomfort. BM is soft. He thinks the pain from hemorrhoids has been affecting his BP. BP has ranged from approx 155/84 to 180/85. He seen PCP and BP was 127/74. Wants to know if there is any oral medication that can be given for hemorrhoids?

## 2019-03-05 ENCOUNTER — Telehealth: Payer: Self-pay

## 2019-03-05 DIAGNOSIS — I4892 Unspecified atrial flutter: Secondary | ICD-10-CM

## 2019-03-05 NOTE — Telephone Encounter (Signed)
-----   Message from Orinda Kenner sent at 03/05/2019  9:29 AM EDT ----- F/U appt scheduled for 9/3. Please order monitor.  Thanks, Coralyn Mark ----- Message ----- From: Josue Hector, MD Sent: 03/02/2019   1:31 PM EDT To: Manson Passey  Needs post hospital f/u with [place] for PAF. Started on xarelto Needs 21 day event monitor

## 2019-03-05 NOTE — Telephone Encounter (Signed)
Unfortunately there is no oral medication for hemorrhoids. I would think that "occasional discomfort" wouldn't be enough to elevate his BP that high. He may want to verify the machine he is using to monitor his BP (especially since his PCP got a reading of 127/74)

## 2019-03-05 NOTE — Telephone Encounter (Signed)
Called and informed pt. Advised him if he continues to have problems to schedule OV since last OV was 07/2017. Verbalized understanding.

## 2019-03-06 ENCOUNTER — Telehealth: Payer: Self-pay | Admitting: Cardiovascular Disease

## 2019-03-06 NOTE — Telephone Encounter (Signed)
Patient states he wants a virtual phone apt on 8/19 1045 am with Dr.Nishan to discuss recent hospitalization and he needs to be taken out of work. He denies any dizziness at this time.

## 2019-03-06 NOTE — Telephone Encounter (Signed)
New message   STAT if patient feels like he/she is going to faint   1) Are you dizzy now?no   2) Do you feel faint or have you passed out?no   3) Do you have any other symptoms?none   4) Have you checked your HR and BP (record if available)?144/76

## 2019-03-11 ENCOUNTER — Encounter: Payer: Self-pay | Admitting: Cardiovascular Disease

## 2019-03-11 NOTE — Progress Notes (Signed)
Virtual Visit via Telephone Note   This visit type was conducted due to national recommendations for restrictions regarding the COVID-19 Pandemic (e.g. social distancing) in an effort to limit this patient's exposure and mitigate transmission in our community.  Due to his co-morbid illnesses, this patient is at least at moderate risk for complications without adequate follow up.  This format is felt to be most appropriate for this patient at this time.  The patient did not have access to video technology/had technical difficulties with video requiring transitioning to audio format only (telephone).  All issues noted in this document were discussed and addressed.  No physical exam could be performed with this format.  Please refer to the patient's chart for his  consent to telehealth for [facility]   Date:  03/12/2019   ID:  Steven [name], DOB [birth year], MRN [id number]  Patient Location: Home Provider Location: Office  PCP:  Scherrie Bateman  Cardiologist:   Johnsie Cancel Electrophysiologist:  None   Evaluation Performed:  Follow-Up Visit  Chief Complaint:  PAF  History of Present Illness:    68 y.o. seen consult [facility] on 03/02/19.  History of HTN and HLD. Had dizzy spell working outside in heat and noted to have rapid afib in ER. CHA2DS2-Vasc 2 for age and HTN.  Reluctant but started on xarelto prior to d/c Had spontaneous conversion. TTE reviewed from 03/02/19 EF 60-65% no significant valve disease LA diameter 3.4 cm.  Outpatient event monitor ordered  No history of CAD  He is having difficulty with his monitor and will go to [place] office to sort Still having vertigo like symptoms CT in hospital of head negative except for benign cyst  The patient  does not have symptoms concerning for COVID-19 infection (fever, chills, cough, or new shortness of breath).    Past Medical History:  Diagnosis Date  . Arthritis   . Cancer [facility])    prostate s/p radiation Mar 2013  .  Chest pain   . Fracture 08/17/2015   MULTIPLE RIB FRACTURES     FROM FALL   . History of radiation therapy 08/22/11-10/13/11   prostate  . Hyperlipemia   . Hypertension    Past Surgical History:  Procedure Laterality Date  . 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 Normal appearing colon  . [place]  . RADIOACTIVE SEED IMPLANT     Prostate     Current Meds  Medication Sig  . amLODipine (NORVASC) 10 MG tablet Take 10 mg by mouth daily.  Marland Kitchen aspirin 81 MG tablet Take 81 mg by mouth daily.  Marland Kitchen atorvastatin (LIPITOR) 20 MG tablet Take 1 tablet by mouth daily.  . hydrocortisone (ANUSOL-HC) 2.5 % rectal cream Place 1 application rectally 2 (two) times daily. Use up to 10 days at a time.  Marland Kitchen losartan-hydrochlorothiazide (HYZAAR) 100-25 MG tablet Take 1 tablet by mouth daily.  . metoprolol tartrate (LOPRESSOR) 25 MG tablet Take 0.5 tablets (12.5 mg total) by mouth 2 (two) times daily.  . polyethylene glycol (MIRALAX / GLYCOLAX) packet Take 17 g by mouth daily. (Patient taking differently: Take 17 g by mouth as needed. )  . potassium chloride SA (K-DUR) 20 MEQ tablet Take 1 tablet by mouth daily.  . rivaroxaban (XARELTO) 20 MG TABS tablet Take 1 tablet (20 mg total) by mouth daily with supper.  Marland Kitchen VITAMIN E PO Take 1 tablet by  mouth daily.     Allergies:   Patient has no known allergies.   Social History   Tobacco Use  . Smoking status: Current Every Day Smoker    Packs/day: 1.00    Years: 20.00    Pack years: 20.00    Types: Cigarettes  . Smokeless tobacco: Former Systems developer  . Tobacco comment: Quit smoking x 2 years    07/2015   SOMETIIMES i USE VAPOR   Substance Use Topics  . Alcohol use: Yes    Comment: rarely  . Drug use: No     Family Hx: The patient's family history includes Aneurysm in his mother; Prostate cancer in his father. There is no history of Colon cancer.  ROS:    Please see the history of present illness.     All other systems reviewed and are negative.   Prior CV studies:   The following studies were reviewed today:  Echo 03/02/19  Labs/Other Tests and Data Reviewed:    EKG:  Rapid fib flutter normal ST segments 03/03/19   Recent Labs: 03/02/2019: ALT 20; BUN 11; Creatinine, Ser 0.76; Hemoglobin 14.1; Magnesium 2.0; Platelets 223; Potassium 3.1; Sodium 141; TSH 0.843   Recent Lipid Panel Lab Results  Component Value Date/Time   CHOL 142 03/02/2019 03:29 AM   TRIG 89 03/02/2019 03:29 AM   HDL 32 (L) 03/02/2019 03:29 AM   CHOLHDL 4.4 03/02/2019 03:29 AM   LDLCALC 92 03/02/2019 03:29 AM    Wt Readings from Last 3 Encounters:  03/12/19 218 lb (98.9 kg)  03/01/19 218 lb (98.9 kg)  03/26/18 218 lb (98.9 kg)     Objective:    Vital Signs:  Ht 5\' 6"  (1.676 m)   Wt 218 lb (98.9 kg)   BMI 35.19 kg/m    Telephone visit no exam   ASSESSMENT & PLAN:    1. PAF continue xarelto and metoprolol event monitor to be sorted  2. HTN:  Well controlled.  Continue current medications and low sodium Dash type diet.   3. HLD:  Continue statin labs with [place] primary  4. Vertigo:  F/u primary or neurology   COVID-19 Education: The signs and symptoms of COVID-19 were discussed with the patient and how to seek care for testing (follow up with PCP or arrange E-visit).  The importance of social distancing was discussed today.  Time:   Today, I have spent 30 minutes with the patient with telehealth technology discussing the above problems.     Medication Adjustments/Labs and Tests Ordered: Current medicines are reviewed at length with the patient today.  Concerns regarding medicines are outlined above.   Tests Ordered:  Event monitor   Medication Changes:  None   Disposition:  Follow up in a year   Signed, Jenkins Rouge, MD  03/12/2019 11:09 AM    [place]

## 2019-03-12 ENCOUNTER — Telehealth (INDEPENDENT_AMBULATORY_CARE_PROVIDER_SITE_OTHER): Payer: BC Managed Care – PPO | Admitting: Cardiovascular Disease

## 2019-03-12 ENCOUNTER — Other Ambulatory Visit: Payer: Self-pay

## 2019-03-12 ENCOUNTER — Encounter: Payer: Self-pay | Admitting: Internal Medicine

## 2019-03-12 ENCOUNTER — Telehealth: Payer: Self-pay | Admitting: Cardiovascular Disease

## 2019-03-12 ENCOUNTER — Encounter: Payer: Self-pay | Admitting: Cardiovascular Disease

## 2019-03-12 DIAGNOSIS — I4891 Unspecified atrial fibrillation: Secondary | ICD-10-CM | POA: Diagnosis not present

## 2019-03-12 NOTE — Telephone Encounter (Signed)
Pt aware of recommendations will forward to [place] PA to see if 03/27/19 appt needs to be bumped out another week as pt will only have monitor for 2 weeks./cy

## 2019-03-12 NOTE — Telephone Encounter (Signed)
It is my understanding pt came by office to get help with wearing it an how to use it today.

## 2019-03-12 NOTE — Telephone Encounter (Signed)
New Message   Patient states that he was told in his appointment with Dr. Johnsie Cancel to go see his PCP due to his symptoms seeming to be Vertigo. Patient would like to know if he needs to continue taking the medication prescribed by Dr. Johnsie Cancel since his PCP will be giving him medicine for Vertigo. Please give patient a call back.

## 2019-03-12 NOTE — Telephone Encounter (Signed)
Yes see [place] continue meds hopefully monitor will be back

## 2019-03-12 NOTE — Telephone Encounter (Signed)
   Appears this was ordered on 8/12. Do we know how long he has been wearing it? Was ordered for 21-days. If he did not start wearing until recently, can push out 1-2 weeks until monitor has resulted.   Signed, Erma Heritage, PA-C 03/12/2019, 1:27 PM Pager: [phone]

## 2019-03-12 NOTE — Telephone Encounter (Signed)
Moved appt to 04/09/19 .Steven [name]

## 2019-03-12 NOTE — Telephone Encounter (Signed)
I spoke with pt who is asking if he should continue metoprolol and Xarelto.  I told pt he should continue taking these medications. He is asking about follow up--Should he see Bernerd Pho, [place] in [place] on September 3 as scheduled?

## 2019-03-18 ENCOUNTER — Telehealth: Payer: Self-pay | Admitting: Cardiovascular Disease

## 2019-03-18 NOTE — Telephone Encounter (Signed)
Lpm 8/25

## 2019-03-18 NOTE — Telephone Encounter (Signed)
° ° °  Patient calling to request letter to be out of work

## 2019-03-20 NOTE — Telephone Encounter (Signed)
I spoke to the patient who reached out to his PCP for work letter, because of his Vertigo symptoms.  He thanked [place] for the call.

## 2019-03-27 ENCOUNTER — Ambulatory Visit: Payer: BC Managed Care – PPO | Admitting: Student

## 2019-04-02 ENCOUNTER — Telehealth: Payer: Self-pay | Admitting: Cardiovascular Disease

## 2019-04-02 NOTE — Telephone Encounter (Signed)
Pt called to report that he has been having on/off chest pressure when he lays down to sleep at night.. he denies any problems during the day.. he does not have problems when exerting himself.. he has some heart "fluttering" with it. He denies dizziness with the fluttering and no sob.. no headache. He says the pressure only last about a minute and goes away on it's own.    He has an appt with B Strader PA in [place] 04/09/19.. he is due to return his monitor today... last night was the end of it. I encouraged him to mail it in as soon as he can.   He has been taking his Xarelto every day without missed doses. He is still having some dizziness when standing for too long and fast head movements not related to the fluttering... he spoke with Dr. Johnsie Cancel about it at his Televisit 02/2019... he spoke with his PCP and they are referring him to a Neurologist for vertigo.. they are calling today to make him his appt.   I asked the pt to continue to monitor and to call if any worsening symptoms... he says he is not ready to return to work.. I advised him to keep his follow up 04/09/19... will call after Dr. Johnsie Cancel reviews and if he has any other recommendations.

## 2019-04-02 NOTE — Telephone Encounter (Signed)
° ° °  Patient calling to report bleeding from mouth in the mornings Dizziness Chest Pain at times, coming and going,mostly at night. Patient also has questions about heart monitor  Pt c/o of Chest Pain: STAT if CP now or developed within 24 hours  1. Are you having CP right now? NO  2. Are you experiencing any other symptoms (ex. SOB, nausea, vomiting, sweating)? dizziness  3. How long have you been experiencing CP? 1 week  4. Is your CP continuous or coming and going? Coming and going  5. Have you taken Nitroglycerin? no ?

## 2019-04-02 NOTE — Telephone Encounter (Signed)
Pt advised and will keep his appt 04/09/19 and will call if anything changes.

## 2019-04-02 NOTE — Telephone Encounter (Signed)
F/u is fine he does not appear to want to go back to work

## 2019-04-08 ENCOUNTER — Ambulatory Visit: Payer: BC Managed Care – PPO | Admitting: Gastroenterology

## 2019-04-08 ENCOUNTER — Encounter: Payer: Self-pay | Admitting: Gastroenterology

## 2019-04-08 ENCOUNTER — Other Ambulatory Visit: Payer: Self-pay

## 2019-04-08 VITALS — BP 178/86 | HR 61 | Temp 97.0°F | Ht 66.0 in | Wt 223.2 lb

## 2019-04-08 DIAGNOSIS — K5900 Constipation, unspecified: Secondary | ICD-10-CM | POA: Diagnosis not present

## 2019-04-08 DIAGNOSIS — K625 Hemorrhage of anus and rectum: Secondary | ICD-10-CM

## 2019-04-08 NOTE — Progress Notes (Signed)
Referring Provider: Jake Samples, PA* Primary Care Physician:  Jake Samples, PA-C  Primary GI: Dr. Gala Romney   Chief Complaint  Patient presents with  . Hemorrhoids    rectal bleeding when he wipes  . Constipation    having to take miralax, straing some with BM, small BM's    HPI:   Steven [name] is a 68 y.o. male presenting today with a history of rectal bleeding in remote past due to radiation proctitis. Last colonoscopy in 2014. Presenting today at request of Delman Cheadle, [place], due to concerns for hemorrhoids, constipation, and rectal bleeding.   Taking Miralax each evening. Constipation off on and for past few years. No prescription medication tried. Will have BM once the next day following Miralax evening prior. BMs are coming out small. Feeling unproductive. Has gas more than he wants. Rectal bleeding with wiping. Has seen some in his shorts as well. If constipated will see a bit more bleeding. Mild rectal discomfort. Has to use an ointment after BMs. No prolapsing tissue. Has had rectal itching, some soreness. Feels something around the rectum. Has soaked, sitz bath, witch hazel, home remedies. Symptoms manageable currently. Bleeding onset was prior to Xarelto. Bleeding about a month.   Recently diagnosed with paroxysmal atrial flutter/afib Aug 2020. Started on Xarelto.   Wants to hold off on colonoscopy right now.   Past Medical History:  Diagnosis Date  . Arthritis   . Atrial fibrillation ([place])   . Atrial flutter ([place])   . Cancer [facility])    prostate s/p radiation Mar 2013  . Chest pain   . Fracture 08/17/2015   MULTIPLE RIB FRACTURES     FROM FALL   . History of radiation therapy 08/22/11-10/13/11   prostate  . Hyperlipemia   . Hypertension     Past Surgical History:  Procedure Laterality Date  . 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 Normal appearing  colon  . [place]  . RADIOACTIVE SEED IMPLANT     Prostate    Current Outpatient Medications  Medication Sig Dispense Refill  . amLODipine (NORVASC) 10 MG tablet Take 10 mg by mouth daily.    Marland Kitchen aspirin 81 MG tablet Take 81 mg by mouth daily.    Marland Kitchen atorvastatin (LIPITOR) 20 MG tablet Take 1 tablet by mouth daily.    . hydrocortisone (ANUSOL-HC) 2.5 % rectal cream Place 1 application rectally 2 (two) times daily. Use up to 10 days at a time. 30 g 2  . losartan-hydrochlorothiazide (HYZAAR) 100-25 MG tablet Take 1 tablet by mouth daily.    . metoprolol tartrate (LOPRESSOR) 25 MG tablet Take 0.5 tablets (12.5 mg total) by mouth 2 (two) times daily. 60 tablet 1  . polyethylene glycol (MIRALAX / GLYCOLAX) packet Take 17 g by mouth daily.    . potassium chloride SA (K-DUR) 20 MEQ tablet Take 1 tablet by mouth daily.    . rivaroxaban (XARELTO) 20 MG TABS tablet Take 1 tablet (20 mg total) by mouth daily with supper. 30 tablet 1  . VITAMIN E PO Take 1 tablet by mouth daily.     No current facility-administered medications for this visit.     Allergies as of 04/08/2019  . (No Known Allergies)    Family History  Problem Relation Age of Onset  . Aneurysm Mother   . Prostate cancer Father   . Colon cancer Neg  Hx   . Colon polyps Neg Hx     Social History   Socioeconomic History  . Marital status: Single    Spouse name: Not on file  . Number of children: Not on file  . Years of education: Not on file  . Highest education level: Not on file  Occupational History  . Occupation: Custodian     Employer: [employer]  Social Needs  . Financial resource strain: Not on file  . Food insecurity    Worry: Not on file    Inability: Not on file  . Transportation needs    Medical: Not on file    Non-medical: Not on file  Tobacco Use  . Smoking status: Former Smoker    Packs/day: 1.00    Years: 20.00    Pack years: 20.00    Types: Cigarettes  . Smokeless tobacco:  Former Systems developer  . Tobacco comment: Quit smoking x 2 years    07/2015   SOMETIIMES i USE VAPOR   Substance and Sexual Activity  . Alcohol use: Yes    Comment: rarely  . Drug use: No  . Sexual activity: Not on file  Lifestyle  . Physical activity    Days per week: Not on file    Minutes per session: Not on file  . Stress: Not on file  Relationships  . Social Herbalist on phone: Not on file    Gets together: Not on file    Attends religious service: Not on file    Active member of club or organization: Not on file    Attends meetings of clubs or organizations: Not on file    Relationship status: Not on file  Other Topics Concern  . Not on file  Social History Narrative   Divorced    Review of Systems: Gen: Denies fever, chills, anorexia. Denies fatigue, weakness, weight loss.  CV: Denies chest pain, palpitations, syncope, peripheral edema, and claudication. Resp: Denies dyspnea at rest, cough, wheezing, coughing up blood, and pleurisy. GI: see HPI  Derm: Denies rash, itching, dry skin Psych: Denies depression, anxiety, memory loss, confusion. No homicidal or suicidal ideation.  Heme: see HPI  Physical Exam: BP (!) 188/89   Pulse 61   Temp (!) 97 F (36.1 C) (Oral)   Ht 5\' 6"  (1.676 m)   Wt 223 lb 3.2 oz (101.2 kg)   BMI 36.03 kg/m  General:   Alert and oriented. No distress noted. Pleasant and cooperative.  Head:  Normocephalic and atraumatic. Eyes:  Conjuctiva clear without scleral icterus. Mouth:  Oral mucosa pink and moist.  Lungs: clear bilaterally Cardiac: regular, S1 S2 present Abdomen:  +BS, soft, non-tender and non-distended. No rebound or guarding. No HSM or masses noted. Rectal: no external hemorrhoids or obvious fissure. Difficult to complete DRE due to patient's difficulty remaining still.  Msk:  Symmetrical without gross deformities. Normal posture. Extremities:  Without edema. Neurologic:  Alert and  oriented x4 Psych:  Alert and cooperative.  Normal mood and affect.

## 2019-04-08 NOTE — Patient Instructions (Addendum)
I would like for you to try Linzess one capsule each morning, 30 minutes before breakfast. It is normal to have loose stool starting out the first 4-5 days, but it should improve. If it doesn't, please call. Also, we have room to increase this if needed. Please let me know how it works so we can adjust the medication.  Continue Anusol per rectum twice a day. Call me if symptoms don't improve or worsen.  I recommend taking Benefiber 2 teaspoons a day and increase to twice a day if tolerated. This helps with productive bowel movements and formed bowel movements.   I will see you in 6 weeks! I recommend a colonoscopy in the near future.  It was a pleasure to see you today. I want to create trusting relationships with patients to provide genuine, compassionate, and quality care. I value your feedback. If you receive a survey regarding your visit,  I greatly appreciate you taking time to fill this out.   Annitta Needs, PhD, ANP-BC [facility]    Hemorrhoids Hemorrhoids are swollen veins in and around the rectum or anus. There are two types of hemorrhoids:  Internal hemorrhoids. These occur in the veins that are just inside the rectum. They may poke through to the outside and become irritated and painful.  External hemorrhoids. These occur in the veins that are outside the anus and can be felt as a painful swelling or hard lump near the anus. Most hemorrhoids do not cause serious problems, and they can be managed with home treatments such as diet and lifestyle changes. If home treatments do not help the symptoms, procedures can be done to shrink or remove the hemorrhoids. What are the causes? This condition is caused by increased pressure in the anal area. This pressure may result from various things, including:  Constipation.  Straining to have a bowel movement.  Diarrhea.  Pregnancy.  Obesity.  Sitting for long periods of time.  Heavy lifting or other activity that  causes you to strain.  Anal sex.  Riding a bike for a long period of time. What are the signs or symptoms? Symptoms of this condition include:  Pain.  Anal itching or irritation.  Rectal bleeding.  Leakage of stool (feces).  Anal swelling.  One or more lumps around the anus. How is this diagnosed? This condition can often be diagnosed through a visual exam. Other exams or tests may also be done, such as:  An exam that involves feeling the rectal area with a gloved hand (digital rectal exam).  An exam of the anal canal that is done using a small tube (anoscope).  A blood test, if you have lost a significant amount of blood.  A test to look inside the colon using a flexible tube with a camera on the end (sigmoidoscopy or colonoscopy). How is this treated? This condition can usually be treated at home. However, various procedures may be done if dietary changes, lifestyle changes, and other home treatments do not help your symptoms. These procedures can help make the hemorrhoids smaller or remove them completely. Some of these procedures involve surgery, and others do not. Common procedures include:  Rubber band ligation. Rubber bands are placed at the base of the hemorrhoids to cut off their blood supply.  Sclerotherapy. Medicine is injected into the hemorrhoids to shrink them.  Infrared coagulation. A type of light energy is used to get rid of the hemorrhoids.  Hemorrhoidectomy surgery. The hemorrhoids are surgically removed, and the veins  that supply them are tied off.  Stapled hemorrhoidopexy surgery. The surgeon staples the base of the hemorrhoid to the rectal wall. Follow these instructions at home: Eating and drinking   Eat foods that have a lot of fiber in them, such as whole grains, beans, nuts, fruits, and vegetables.  Ask your health care provider about taking products that have added fiber (fiber supplements).  Reduce the amount of fat in your diet. You can do  this by eating low-fat dairy products, eating less red meat, and avoiding processed foods.  Drink enough fluid to keep your urine pale yellow. Managing pain and swelling   Take warm sitz baths for 20 minutes, 3-4 times a day to ease pain and discomfort. You may do this in a bathtub or using a portable sitz bath that fits over the toilet.  If directed, apply ice to the affected area. Using ice packs between sitz baths may be helpful. ? Put ice in a plastic bag. ? Place a towel between your skin and the bag. ? Leave the ice on for 20 minutes, 2-3 times a day. General instructions  Take over-the-counter and prescription medicines only as told by your health care provider.  Use medicated creams or suppositories as told.  Get regular exercise. Ask your health care provider how much and what kind of exercise is best for you. In general, you should do moderate exercise for at least 30 minutes on most days of the week (150 minutes each week). This can include activities such as walking, biking, or yoga.  Go to the bathroom when you have the urge to have a bowel movement. Do not wait.  Avoid straining to have bowel movements.  Keep the anal area dry and clean. Use wet toilet paper or moist towelettes after a bowel movement.  Do not sit on the toilet for long periods of time. This increases blood pooling and pain.  Keep all follow-up visits as told by your health care provider. This is important. Contact a health care provider if you have:  Increasing pain and swelling that are not controlled by treatment or medicine.  Difficulty having a bowel movement, or you are unable to have a bowel movement.  Pain or inflammation outside the area of the hemorrhoids. Get help right away if you have:  Uncontrolled bleeding from your rectum. Summary  Hemorrhoids are swollen veins in and around the rectum or anus.  Most hemorrhoids can be managed with home treatments such as diet and lifestyle  changes.  Taking warm sitz baths can help ease pain and discomfort.  In severe cases, procedures or surgery can be done to shrink or remove the hemorrhoids. This information is not intended to replace advice given to you by your health care provider. Make sure you discuss any questions you have with your health care provider. Document Released: 07/07/2000 Document Revised: 07/18/2018 Document Reviewed: 11/29/2017 Elsevier Patient Education  2020 [employer].

## 2019-04-08 NOTE — Assessment & Plan Note (Addendum)
Trial Linzess 72 mcg once daily. Add supplemental fiber. Call with update. May need dose increase. 6 week return.

## 2019-04-08 NOTE — Progress Notes (Signed)
Cardiology Office Note    Date:  04/09/2019   ID:  Steven [name], DOB 08-20-1950, MRN [id number]  PCP:  Jake Samples, PA-C  Cardiologist: Jenkins Rouge, MD    Chief Complaint  Patient presents with  . Follow-up    1 month visit    History of Present Illness:    Steven [name] is a 68 y.o. male with past medical history of paroxysmal atrial flutter/fibrillation, HTN, and HLD who presents to the office today for 1 month follow-up.  He was most recently admitted to [facility] in 02/2019 and found to be in atrial flutter with RVR. He spontaneously converted back to normal sinus rhythm and was started on Xarelto for anticoagulation.  He had a telehealth follow-up visit with Dr. Johnsie Cancel on 03/12/2019 and denied any recurrent dyspnea or palpitations but was having episodes of vertigo. A 21-day event monitor had been recommended following discharge but he had issues obtaining this, therefore it was recommended he come to the office to have assistance placing the monitor. The EOS is in [place] but it has not been officially resulted. By review of the EOS he did not have any recurrent episodes of atrial fibrillation.  Did have episodes of sinus bradycardia at times and 1 critical event due to syncope but the patient reports he did not lose consciousness and must have accidentally pushed the button.   In talking with the patient today, he reports having intermittent episodes of dizziness and headaches since his hospitalization but reports this was actually occurring prior to his diagnosis of atrial fibrillation. He is scheduled to see Neurology later this month as a referral was entered by his PCP. He denies any specific palpitations and reports his breathing continues to improve. He has been doing some yard work and is trying to walk daily for exercise to improve his endurance. He denies any recent chest pain, orthopnea, PND, or lower extremity edema.  BP is elevated at 159/77 during today's visit  and he reports elevated readings when checked at home as well. He is listed as taking both Amlodipine and Hyzaar but says he has not taken Amlodipine in several months as this was discontinued by his PCP due to lower extremity swelling.  Past Medical History:  Diagnosis Date  . Arthritis   . Atrial fibrillation ([place])   . Atrial flutter ([place])   . Cancer [facility])    prostate s/p radiation Mar 2013  . Chest pain   . Fracture 08/17/2015   MULTIPLE RIB FRACTURES     FROM FALL   . History of radiation therapy 08/22/11-10/13/11   prostate  . Hyperlipemia   . Hypertension     Past Surgical History:  Procedure Laterality Date  . 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 Normal appearing colon  . [place]  . RADIOACTIVE SEED IMPLANT     Prostate    Current Medications: Outpatient Medications Prior to Visit  Medication Sig Dispense Refill  . aspirin 81 MG tablet Take 81 mg by mouth daily.    Marland Kitchen atorvastatin (LIPITOR) 20 MG tablet Take 1 tablet by mouth daily.    . hydrocortisone (ANUSOL-HC) 2.5 % rectal cream Place 1 application rectally 2 (two) times daily. Use up to 10 days at a time. 30 g 2  . losartan-hydrochlorothiazide (HYZAAR) 100-25 MG tablet Take 1 tablet by mouth daily.    . metoprolol tartrate (LOPRESSOR)  25 MG tablet Take 0.5 tablets (12.5 mg total) by mouth 2 (two) times daily. 60 tablet 1  . polyethylene glycol (MIRALAX / GLYCOLAX) packet Take 17 g by mouth daily.    . potassium chloride SA (K-DUR) 20 MEQ tablet Take 1 tablet by mouth daily.    . rivaroxaban (XARELTO) 20 MG TABS tablet Take 1 tablet (20 mg total) by mouth daily with supper. 30 tablet 1  . VITAMIN E PO Take 1 tablet by mouth daily.    Marland Kitchen amLODipine (NORVASC) 10 MG tablet Take 10 mg by mouth daily.     No facility-administered medications prior to visit.      Allergies:   Patient has no known allergies.    Social History   Socioeconomic History  . Marital status: Single    Spouse name: Not on file  . Number of children: Not on file  . Years of education: Not on file  . Highest education level: Not on file  Occupational History  . Occupation: Custodian     Employer: [employer]  Social Needs  . Financial resource strain: Not on file  . Food insecurity    Worry: Not on file    Inability: Not on file  . Transportation needs    Medical: Not on file    Non-medical: Not on file  Tobacco Use  . Smoking status: Former Smoker    Packs/day: 1.00    Years: 20.00    Pack years: 20.00    Types: Cigarettes  . Smokeless tobacco: Former Systems developer  . Tobacco comment: Quit smoking x 2 years    07/2015   SOMETIIMES i USE VAPOR   Substance and Sexual Activity  . Alcohol use: Yes    Comment: rarely  . Drug use: No  . Sexual activity: Not on file  Lifestyle  . Physical activity    Days per week: Not on file    Minutes per session: Not on file  . Stress: Not on file  Relationships  . Social Herbalist on phone: Not on file    Gets together: Not on file    Attends religious service: Not on file    Active member of club or organization: Not on file    Attends meetings of clubs or organizations: Not on file    Relationship status: Not on file  Other Topics Concern  . Not on file  Social History Narrative   Divorced     Family History:  The patient's family history includes Aneurysm in his mother; Prostate cancer in his father.   Review of Systems:   Please see the history of present illness.     General:  No chills, fever, night sweats or weight changes.  Cardiovascular:  No chest pain, dyspnea on exertion, edema, orthopnea, palpitations, paroxysmal nocturnal dyspnea. Dermatological: No rash, lesions/masses Respiratory: No cough, dyspnea Urologic: No hematuria, dysuria Abdominal:   No nausea, vomiting, diarrhea, bright red blood per rectum, melena, or hematemesis  Neurologic:  No visual changes, wkns, changes in mental status. Positive for dizziness and headaches.   All other systems reviewed and are otherwise negative except as noted above.   Physical Exam:    VS:  BP (!) 159/77   Pulse 62   Temp 98 F (36.7 C)   Ht 5\' 6"  (1.676 m)   Wt 222 lb (100.7 kg)   SpO2 94%   BMI 35.83 kg/m    General: Well developed, well nourished,male appearing in  no acute distress. Head: Normocephalic, atraumatic, sclera non-icteric, no xanthomas, nares are without discharge.  Neck: No carotid bruits. JVD not elevated.  Lungs: Respirations regular and unlabored, without wheezes or rales.  Heart: Regular rate and rhythm. No S3 or S4.  No murmur, no rubs, or gallops appreciated. Abdomen: Soft, non-tender, non-distended with normoactive bowel sounds. No hepatomegaly. No rebound/guarding. No obvious abdominal masses. Msk:  Strength and tone appear normal for age. No joint deformities or effusions. Extremities: No clubbing or cyanosis. No lower extremity edema.  Distal pedal pulses are 2+ bilaterally. Neuro: Alert and oriented X 3. Moves all extremities spontaneously. No focal deficits noted. Psych:  Responds to questions appropriately with a normal affect. Skin: No rashes or lesions noted  Wt Readings from Last 3 Encounters:  04/09/19 222 lb (100.7 kg)  04/08/19 223 lb 3.2 oz (101.2 kg)  03/12/19 218 lb (98.9 kg)     Studies/Labs Reviewed:   EKG:  EKG is not ordered today.   Recent Labs: 03/02/2019: ALT 20; BUN 11; Creatinine, Ser 0.76; Hemoglobin 14.1; Magnesium 2.0; Platelets 223; Potassium 3.1; Sodium 141; TSH 0.843   Lipid Panel    Component Value Date/Time   CHOL 142 03/02/2019 0329   TRIG 89 03/02/2019 0329   HDL 32 (L) 03/02/2019 0329   CHOLHDL 4.4 03/02/2019 0329   VLDL 18 03/02/2019 0329   LDLCALC 92 03/02/2019 0329    Additional studies/ records that were reviewed today include:   Echocardiogram: 03/02/2019 IMPRESSIONS   1. The left  ventricle has normal systolic function with an ejection fraction of 60-65%. The cavity size was normal. Left ventricular diastolic parameters were normal.  2. The right ventricle has normal systolic function. The cavity was normal. There is no increase in right ventricular wall thickness.  3. Mild thickening of the mitral valve leaflet.  4. The aortic valve is tricuspid. Mild thickening of the aortic valve. Mild calcification of the aortic valve.  5. The aorta is normal in size and structure.  Assessment:    1. Atrial flutter, paroxysmal ([place])   2. Essential hypertension   3. Hyperlipidemia, unspecified hyperlipidemia type      Plan:   In order of problems listed above:  1. Paroxysmal Atrial Flutter - He experienced spontaneous conversion to normal sinus rhythm during recent admission last month and maintained normal rhythm by recent event monitor. He is anxious to discontinue anticoagulation but I reviewed with the patient today that given his recent event would continue for now and he is in agreement at this time. He does report occasional nosebleeds and I recommended he stop ASA given the need for anticoagulation. - Continue Lopressor 12.5 mg twice daily. Would not further titrate given episodes of bradycardia by event monitor.  2. HTN - BP is elevated at 159/77 during today's visit and he reports similar readings when checked at home. He is currently on Losartan-HCTZ 100-25mg  daily and Lopressor 12.5mg  BID. He is listed as taking Amlodipine but says he has not taken this in several months. Given the intermittent episodes of bradycardia on his event monitor, would not further titrate beta-blocker therapy at this time. Will restart Amlodipine at a lower dose of 5 mg daily. I have encouraged him to keep a BP log. If he develops recurrent lower extremity edema with this, would consider transition to Hydralazine.  3. HLD - followed by PCP. FLP during recent admission showed total cholesterol  142, triglycerides 89, HDL 32, and LDL 92. He remains on Atorvastatin 20 mg  daily.  Medication Adjustments/Labs and Tests Ordered: Current medicines are reviewed at length with the patient today.  Concerns regarding medicines are outlined above.  Medication changes, Labs and Tests ordered today are listed in the Patient Instructions below. Patient Instructions  Medication Instructions:  Your physician has recommended you make the following change in your medication:  Stop Taking Aspirin  Start Taking Amlodipine 5 mg daily   If you need a refill on your cardiac medications before your next appointment, please call your pharmacy.   Lab work: NONE  If you have labs (blood work) drawn today and your tests are completely normal, you will receive your results only by: Marland Kitchen MyChart Message (if you have MyChart) OR . A paper copy in the mail If you have any lab test that is abnormal or we need to change your treatment, we will call you to review the results.  Testing/Procedures: NONE   Follow-Up: At [facility], you and your health needs are our priority.  As part of our continuing mission to provide you with exceptional heart care, we have created designated Provider Care Teams.  These Care Teams include your primary Cardiologist (physician) and Advanced Practice Providers (APPs -  Physician Assistants and Nurse Practitioners) who all work together to provide you with the care you need, when you need it. You will need a follow up appointment in 3 months.  Please call our office 2 months in advance to schedule this appointment.  You may see Jenkins Rouge, MD or one of the following Advanced Practice Providers on your designated Care Team:   Bernerd Pho, PA-C [facility]) . Ermalinda Barrios, PA-C ([place])  Any Other Special Instructions Will Be Listed Below (If Applicable). Your physician has requested that you regularly monitor and record your blood pressure readings at home.  Please use the same machine at the same time of day to check your readings and record them to bring to your follow-up visit.   Thank you for choosing [facility]!      Signed, Erma Heritage, PA-C  04/09/2019 5:09 PM    [place] [address] [place], [place] [address] Phone: [phone] Fax: [phone]

## 2019-04-08 NOTE — Assessment & Plan Note (Signed)
In setting of constipation. History of radiation proctitis s/p APC therapy in 2014, known history of internal hemorrhoids. Symptomatically, seems to be dealing more with hemorrhoids. Unable to exclude component of proctitis. DRE attempted today but incomplete due to patient having difficulty remaining still. Recommend colonoscopy as last was in 2014. However, he would like to hold off on this currently and reassess in next few weeks due to recent health issues (new onset afib/aflutter now on Xarelto).   Anusol BID Bowel regimen, address constipation May need Canasa suppositories Colonoscopy in near future when patient willing to pursue Does not seem to be significant GI bleeding: continue Xarelto for now and monitor for worsening. Hgb stable. 6 week return

## 2019-04-09 ENCOUNTER — Encounter: Payer: Self-pay | Admitting: Student

## 2019-04-09 ENCOUNTER — Ambulatory Visit (INDEPENDENT_AMBULATORY_CARE_PROVIDER_SITE_OTHER): Payer: BC Managed Care – PPO

## 2019-04-09 ENCOUNTER — Ambulatory Visit (INDEPENDENT_AMBULATORY_CARE_PROVIDER_SITE_OTHER): Payer: BC Managed Care – PPO | Admitting: Student

## 2019-04-09 VITALS — BP 159/77 | HR 62 | Temp 98.0°F | Ht 66.0 in | Wt 222.0 lb

## 2019-04-09 DIAGNOSIS — E785 Hyperlipidemia, unspecified: Secondary | ICD-10-CM | POA: Diagnosis not present

## 2019-04-09 DIAGNOSIS — I4892 Unspecified atrial flutter: Secondary | ICD-10-CM

## 2019-04-09 DIAGNOSIS — I10 Essential (primary) hypertension: Secondary | ICD-10-CM

## 2019-04-09 MED ORDER — AMLODIPINE BESYLATE 5 MG PO TABS: 5.0000 mg | ORAL_TABLET | Freq: Every day | ORAL | 3 refills | Status: DC

## 2019-04-09 NOTE — Patient Instructions (Addendum)
Medication Instructions:  Your physician has recommended you make the following change in your medication:  Stop Taking Aspirin  Start Taking Amlodipine 5 mg daily   If you need a refill on your cardiac medications before your next appointment, please call your pharmacy.   Lab work: NONE  If you have labs (blood work) drawn today and your tests are completely normal, you will receive your results only by: Marland Kitchen MyChart Message (if you have MyChart) OR . A paper copy in the mail If you have any lab test that is abnormal or we need to change your treatment, we will call you to review the results.  Testing/Procedures: NONE   Follow-Up: At [facility], you and your health needs are our priority.  As part of our continuing mission to provide you with exceptional heart care, we have created designated Provider Care Teams.  These Care Teams include your primary Cardiologist (physician) and Advanced Practice Providers (APPs -  Physician Assistants and Nurse Practitioners) who all work together to provide you with the care you need, when you need it. You will need a follow up appointment in 3 months.  Please call our office 2 months in advance to schedule this appointment.  You may see Jenkins Rouge, MD or one of the following Advanced Practice Providers on your designated Care Team:   Bernerd Pho, PA-C [facility]) . Ermalinda Barrios, PA-C ([place])  Any Other Special Instructions Will Be Listed Below (If Applicable). Your physician has requested that you regularly monitor and record your blood pressure readings at home. Please use the same machine at the same time of day to check your readings and record them to bring to your follow-up visit.   Thank you for choosing [facility]!

## 2019-04-13 NOTE — Progress Notes (Signed)
CC'ED TO PCP 

## 2019-04-17 ENCOUNTER — Encounter (HOSPITAL_COMMUNITY): Payer: Self-pay | Admitting: Emergency Medicine

## 2019-04-17 ENCOUNTER — Observation Stay (HOSPITAL_COMMUNITY)
Admission: EM | Admit: 2019-04-17 | Discharge: 2019-04-18 | Disposition: A | Discharge status: Home / Self Care | Payer: BC Managed Care – PPO | Attending: Family Medicine | Admitting: Family Medicine

## 2019-04-17 ENCOUNTER — Emergency Department (HOSPITAL_COMMUNITY): Payer: BC Managed Care – PPO

## 2019-04-17 ENCOUNTER — Other Ambulatory Visit: Payer: Self-pay

## 2019-04-17 DIAGNOSIS — I480 Paroxysmal atrial fibrillation: Secondary | ICD-10-CM | POA: Diagnosis not present

## 2019-04-17 DIAGNOSIS — Z8042 Family history of malignant neoplasm of prostate: Secondary | ICD-10-CM | POA: Insufficient documentation

## 2019-04-17 DIAGNOSIS — R7989 Other specified abnormal findings of blood chemistry: Secondary | ICD-10-CM | POA: Insufficient documentation

## 2019-04-17 DIAGNOSIS — M1990 Unspecified osteoarthritis, unspecified site: Secondary | ICD-10-CM | POA: Diagnosis not present

## 2019-04-17 DIAGNOSIS — I482 Chronic atrial fibrillation, unspecified: Secondary | ICD-10-CM | POA: Diagnosis present

## 2019-04-17 DIAGNOSIS — R42 Dizziness and giddiness: Secondary | ICD-10-CM

## 2019-04-17 DIAGNOSIS — I4892 Unspecified atrial flutter: Secondary | ICD-10-CM | POA: Diagnosis not present

## 2019-04-17 DIAGNOSIS — Z8546 Personal history of malignant neoplasm of prostate: Secondary | ICD-10-CM | POA: Diagnosis not present

## 2019-04-17 DIAGNOSIS — Z7901 Long term (current) use of anticoagulants: Secondary | ICD-10-CM | POA: Insufficient documentation

## 2019-04-17 DIAGNOSIS — Z923 Personal history of irradiation: Secondary | ICD-10-CM | POA: Diagnosis not present

## 2019-04-17 DIAGNOSIS — Z79899 Other long term (current) drug therapy: Secondary | ICD-10-CM | POA: Diagnosis not present

## 2019-04-17 DIAGNOSIS — I10 Essential (primary) hypertension: Secondary | ICD-10-CM | POA: Diagnosis not present

## 2019-04-17 DIAGNOSIS — E876 Hypokalemia: Secondary | ICD-10-CM | POA: Insufficient documentation

## 2019-04-17 DIAGNOSIS — E785 Hyperlipidemia, unspecified: Secondary | ICD-10-CM | POA: Diagnosis not present

## 2019-04-17 DIAGNOSIS — Z87891 Personal history of nicotine dependence: Secondary | ICD-10-CM | POA: Diagnosis not present

## 2019-04-17 DIAGNOSIS — I491 Atrial premature depolarization: Secondary | ICD-10-CM | POA: Insufficient documentation

## 2019-04-17 LAB — CBC WITH DIFFERENTIAL/PLATELET
Abs Immature Granulocytes: 0.01 10*3/uL (ref 0.00–0.07)
Basophils Absolute: 0 10*3/uL (ref 0.0–0.1)
Basophils Relative: 1 %
Eosinophils Absolute: 0.1 10*3/uL (ref 0.0–0.5)
Eosinophils Relative: 2 %
HCT: 47.1 % (ref 39.0–52.0)
Hemoglobin: 15.4 g/dL (ref 13.0–17.0)
Immature Granulocytes: 0 %
Lymphocytes Relative: 38 %
Lymphs Abs: 1.7 10*3/uL (ref 0.7–4.0)
MCH: 29.1 pg (ref 26.0–34.0)
MCHC: 32.7 g/dL (ref 30.0–36.0)
MCV: 89 fL (ref 80.0–100.0)
Monocytes Absolute: 0.4 10*3/uL (ref 0.1–1.0)
Monocytes Relative: 8 %
Neutro Abs: 2.2 10*3/uL (ref 1.7–7.7)
Neutrophils Relative %: 51 %
Platelets: 235 10*3/uL (ref 150–400)
RBC: 5.29 MIL/uL (ref 4.22–5.81)
RDW: 12.5 % (ref 11.5–15.5)
WBC: 4.4 10*3/uL (ref 4.0–10.5)
nRBC: 0 % (ref 0.0–0.2)

## 2019-04-17 LAB — URINALYSIS, ROUTINE W REFLEX MICROSCOPIC
Bilirubin Urine: NEGATIVE
Glucose, UA: NEGATIVE mg/dL
Hgb urine dipstick: NEGATIVE
Ketones, ur: NEGATIVE mg/dL
Leukocytes,Ua: NEGATIVE
Nitrite: NEGATIVE
Protein, ur: NEGATIVE mg/dL
Specific Gravity, Urine: 1.003 — ABNORMAL LOW (ref 1.005–1.030)
pH: 7 (ref 5.0–8.0)

## 2019-04-17 LAB — BASIC METABOLIC PANEL
Anion gap: 11 (ref 5–15)
BUN: 12 mg/dL (ref 8–23)
CO2: 26 mmol/L (ref 22–32)
Calcium: 9.7 mg/dL (ref 8.9–10.3)
Chloride: 103 mmol/L (ref 98–111)
Creatinine, Ser: 0.94 mg/dL (ref 0.61–1.24)
GFR calc Af Amer: >60 mL/min (ref >60)
GFR calc non Af Amer: >60 mL/min (ref >60)
Glucose, Bld: 110 mg/dL — ABNORMAL HIGH (ref 70–99)
Potassium: 3.3 mmol/L — ABNORMAL LOW (ref 3.5–5.1)
Sodium: 140 mmol/L (ref 135–145)

## 2019-04-17 LAB — TROPONIN I (HIGH SENSITIVITY)
Troponin I (High Sensitivity): 11 ng/L (ref <18)
Troponin I (High Sensitivity): 55 ng/L — ABNORMAL HIGH (ref <18)
Troponin I (High Sensitivity): 58 ng/L — ABNORMAL HIGH (ref <18)

## 2019-04-17 LAB — PROTIME-INR
INR: 1.2 (ref 0.8–1.2)
Prothrombin Time: 15.1 seconds (ref 11.4–15.2)

## 2019-04-17 LAB — MAGNESIUM: Magnesium: 2.2 mg/dL (ref 1.7–2.4)

## 2019-04-17 MED ORDER — RIVAROXABAN 20 MG PO TABS
20.0000 mg | ORAL_TABLET | Freq: Once | ORAL | Status: AC
  Administered 2019-04-17: 20 mg via ORAL
  Filled 2019-04-17: qty 1

## 2019-04-17 MED ORDER — SODIUM CHLORIDE 0.9 % IV BOLUS
1000.0000 mL | Freq: Once | INTRAVENOUS | Status: AC
  Administered 2019-04-17: 1000 mL via INTRAVENOUS

## 2019-04-17 MED ORDER — METOPROLOL TARTRATE 5 MG/5ML IV SOLN
5.0000 mg | Freq: Once | INTRAVENOUS | Status: DC
  Filled 2019-04-17: qty 5

## 2019-04-17 MED ORDER — METOPROLOL TARTRATE 25 MG PO TABS
25.0000 mg | ORAL_TABLET | Freq: Once | ORAL | Status: AC
  Administered 2019-04-17: 25 mg via ORAL
  Filled 2019-04-17: qty 1

## 2019-04-17 NOTE — ED Triage Notes (Signed)
Pt got dizzy, lightheaded  and sweaty working outside.  Felt like he was going to pass out.

## 2019-04-17 NOTE — H&P (Signed)
History and Physical    Steven [name] [id number] DOB: 16-Mar-1951 DOA: 04/17/2019  PCP: Jake Samples, PA-C   Patient coming from: Home  I have personally briefly reviewed patient's old medical records in [place]  Chief Complaint: Dizziness  HPI: Steven [name] is a 68 y.o. male with medical history significant for hypertension, prostate cancer, recent a flutter diagnosis.  Patient was working outside today when he suddenly became dizzy, and sweaty.  Similar to the episode he had earlier this month when he was diagnosed with atrial fibrillation/flutter.  Patient is on anticoagulation with Xarelto.  He reports compliance.  But he did not take his metoprolol dose this morning. Patient denies chest pain, no difficulty breathing.  Reports alcohol intake on social occasions only.  No fever no chills.  Recent hospitalization 8/8 - 8/9-for paroxysmal atrial flutter/atrial fibrillation.  Chads Vasc Score 2.  Patient self converted to sinus rhythm spontaneously at that time also.  EF 60 - 65% without wall motion abnormalities.  Patient was started on metoprolol and Xarelto prior to discharge.  Since discharge patient has been doing well, he followed up with cardiology 03/12/2019.  ED Course: Heart rate initially 146, patient subsequently self converted to sinus rhythm with rate 60s to 70s, without intervention.  Blood pressure systolic Q000111Q to [id number].  At bedtime troponin 11 >> 55. EDP talked to cardiology on call, Dr. Martin-recommendations here for an observation admission.  Trend troponin overnight.  Elevated troponin likely consistent with RVR but need to rule out ACS.  Review of Systems: As per HPI all other systems reviewed and negative.  Past Medical History:  Diagnosis Date  . Arthritis   . Atrial fibrillation ([place])   . Atrial flutter ([place])   . Cancer [facility])    prostate s/p radiation Mar 2013  . Chest pain   . Fracture 08/17/2015   MULTIPLE RIB FRACTURES     FROM FALL    . History of radiation therapy 08/22/11-10/13/11   prostate  . Hyperlipemia   . Hypertension     Past Surgical History:  Procedure Laterality Date  . 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 Normal appearing colon  . [place]  . RADIOACTIVE SEED IMPLANT     Prostate     reports that he has quit smoking. His smoking use included cigarettes. He has a 20.00 pack-year smoking history. He has quit using smokeless tobacco. He reports current alcohol use. He reports that he does not use drugs.  No Known Allergies  Family History  Problem Relation Age of Onset  . Aneurysm Mother   . Prostate cancer Father   . Colon cancer Neg Hx   . Colon polyps Neg Hx     Prior to Admission medications   Medication Sig Start Date End Date Taking? Authorizing Provider  amLODipine (NORVASC) 5 MG tablet Take 1 tablet (5 mg total) by mouth daily. 04/09/19 07/08/19 Yes Strader, Fransisco Hertz, PA-C  atorvastatin (LIPITOR) 20 MG tablet Take 1 tablet by mouth daily. 12/17/18  Yes [provider]  Cholecalciferol (VITAMIN D3) 125 MCG (5000 UT) CAPS Take 1 capsule by mouth daily.   Yes [provider]  Cyanocobalamin (B-12 PO) Take 1 tablet by mouth daily.   Yes [provider]  hydrocortisone (ANUSOL-HC) 2.5 % rectal cream Place 1 application rectally 2 (two) times daily. Use up to 10 days at a time.  Patient taking differently: Place 1 application rectally 2 (two) times daily as needed for hemorrhoids or anal itching. Use up to 10 days at a time. 02/11/19  Yes Carlis Stable, NP  losartan-hydrochlorothiazide (HYZAAR) 100-25 MG tablet Take 1 tablet by mouth daily. 02/11/19  Yes [provider]  metoprolol tartrate (LOPRESSOR) 25 MG tablet Take 0.5 tablets (12.5 mg total) by mouth 2 (two) times daily. 03/02/19  Yes Eugenie Filler, MD  Omega-3 Fatty Acids (FISH OIL) 1000 MG CAPS  Take 1 capsule by mouth daily.   Yes [provider]  potassium chloride SA (K-DUR) 20 MEQ tablet Take 1 tablet by mouth daily. 10/29/18  Yes [provider]  rivaroxaban (XARELTO) 20 MG TABS tablet Take 1 tablet (20 mg total) by mouth daily with supper. 03/02/19  Yes Eugenie Filler, MD  vitamin C (ASCORBIC ACID) 500 MG tablet Take 500 mg by mouth daily.   Yes [provider]  VITAMIN E PO Take 1 tablet by mouth daily.   Yes [provider]    Physical Exam: Vitals:   04/17/19 2030 04/17/19 2100 04/17/19 2117 04/17/19 2130  BP: (!) 145/88 (!) 162/93  (!) 171/88  Pulse: 60 61 72 69  Resp: 16 15  19   Temp:      TempSrc:      SpO2: 100% 100%  100%  Weight:      Height:        Constitutional: NAD, calm, comfortable Vitals:   04/17/19 2030 04/17/19 2100 04/17/19 2117 04/17/19 2130  BP: (!) 145/88 (!) 162/93  (!) 171/88  Pulse: 60 61 72 69  Resp: 16 15  19   Temp:      TempSrc:      SpO2: 100% 100%  100%  Weight:      Height:       Eyes: PERRL, lids and conjunctivae normal ENMT: Mucous membranes are moist. Posterior pharynx clear of any exudate or lesions.  Neck: normal, supple, no masses, no thyromegaly Respiratory: clear to auscultation bilaterally, no wheezing, no crackles. Normal respiratory effort. No accessory muscle use.  Cardiovascular: Regular rate and rhythm, no murmurs / rubs / gallops. No extremity edema. 2+ pedal pulses.  Abdomen: no tenderness, no masses palpated. No hepatosplenomegaly. Bowel sounds positive.  Musculoskeletal: no clubbing / cyanosis. No joint deformity upper and lower extremities. Good ROM, no contractures. Normal muscle tone.  Skin: no rashes, lesions, ulcers. No induration Neurologic: CN 2-12 grossly intact.  Strength 5/5 in all 4.  Psychiatric: Normal judgment and insight. Alert and oriented x 3. Normal mood.   Labs on Admission: I have personally reviewed following labs and imaging studies  CBC: Recent Labs   Lab 04/17/19 1743  WBC 4.4  NEUTROABS 2.2  HGB 15.4  HCT 47.1  MCV 89.0  PLT [id number]   Basic Metabolic Panel: Recent Labs  Lab 04/17/19 1743 04/17/19 1950  NA 140  --   K 3.3*  --   CL 103  --   CO2 26  --   GLUCOSE 110*  --   BUN 12  --   CREATININE 0.94  --   CALCIUM 9.7  --   MG  --  2.2   Coagulation Profile: Recent Labs  Lab 04/17/19 1743  INR 1.2   Urine analysis:    Component Value Date/Time   COLORURINE STRAW (A) 04/17/2019 1913   APPEARANCEUR CLEAR 04/17/2019 1913   LABSPEC 1.003 (L) 04/17/2019 1913   PHURINE 7.0 04/17/2019 1913  GLUCOSEU NEGATIVE 04/17/2019 1913   HGBUR NEGATIVE 04/17/2019 [place] 04/17/2019 [place] 04/17/2019 1913   PROTEINUR NEGATIVE 04/17/2019 1913   UROBILINOGEN 1.0 08/03/2012 1156   NITRITE NEGATIVE 04/17/2019 [place] 04/17/2019 1913    Radiological Exams on Admission: Dg Chest 2 View  Result Date: 04/17/2019 CLINICAL DATA:  Near-syncope. EXAM: CHEST - 2 VIEW COMPARISON:  Chest x-ray dated March 01, 2019. FINDINGS: The heart size and mediastinal contours are within normal limits. Both lungs are clear. The visualized skeletal structures are unremarkable. IMPRESSION: No active cardiopulmonary disease. Electronically Signed   By: Titus Dubin M.D.   On: 04/17/2019 19:08    EKG: Independently reviewed.  Initial EKG shows atrial flutter rate 143 subsequent EKG showed sinus rhythm.  Assessment/Plan Active Problems:   Atrial flutter (H[facility])    Paroxysmal atrial flutter-symptomatic with dizziness.  Initial rate 140s, self converted to sinus rhythm.  Rates now in the 60s to 46s.  Reports compliance with his Xarelto.  Missed dose of metoprolol this a.m.  Magnesium 2.2.  Recent TSH 0.8.   Hs troponin 11 > 55.  Likely demand from RVR.  No chest pain.  No difficulty breathing. EDP talked to cardiologist on-call, Dr. Hassell Done was to follow-up with EP on an urgent basis, but with  increase in troponin, recommended obs admission, trend troponin.  -Resume home Xarelto and metoprolol. -Trend troponin x2 -Arrange urgent follow-up with cardiology and EP on discharge  Hypertension-systolic Q000111Q to [id number]. -Resume Norvasc 5 mg daily, losartan HCTZ 100- 25 mg, metoprolol 12.5 mg twice daily.  History of prostate cancer- s/p radiation therapy 2013.   DVT prophylaxis:  Xarelto Code Status: Full Family Communication: None at bedside Disposition Plan: 1 day Consults called: None Admission status: Obs, Tele   Bethena Roys MD [facility]  04/17/2019, 10:26 PM

## 2019-04-17 NOTE — ED Provider Notes (Signed)
[facility] EMERGENCY DEPARTMENT Provider Note   CSN: [id number] Arrival date & time: 04/17/19  1644     History   Chief Complaint Chief Complaint  Patient presents with   Tachycardia    HPI Steven [name] is a 68 y.o. male.     68 y.o male with a PMH of Atrial Flutter, HTN presents to the ED with a chief complaint of dizziness.  Patient reports he was outside wiping of his car today when he began to feel lightheaded for a couple minutes, reports going inside his mouth and broke out into cold sweats.  He reports a similar episode earlier this month when he was first diagnosed with A. fib.  Patient was placed on trouble, Xarelto to help with his rate control and placed on anticoagulation.  He reports compliance with the medication, last took his Xarelto last night between 6 and 7 PM with supper.  He reports he thinks he likely missed his metoprolol a.m. dose.  He reports his symptoms improve after arriving into the emergency department, reports feeling slightly better.  He denies any chest pain, shortness of breath, abdominal pain, fevers, back pain.       Past Medical History:  Diagnosis Date   Arthritis    Atrial fibrillation [facility])    Atrial flutter ([place])    Cancer [facility])    prostate s/p radiation Mar 2013   Chest pain    Fracture 08/17/2015   MULTIPLE RIB FRACTURES     FROM FALL    History of radiation therapy 08/22/11-10/13/11   prostate   Hyperlipemia    Hypertension     Patient Active Problem List   Diagnosis Date Noted   Atrial flutter, paroxysmal ([place]) 03/02/2019   HTN (hypertension) 03/02/2019   Elevated troponin 03/01/2019   Hypokalemia 03/01/2019   Constipation 08/02/2017   Hemorrhoids 08/02/2017   Fall 08/18/2015   Open fracture of left elbow 08/18/2015   Rib fractures 08/17/2015   Radiation proctitis 01/30/2013   Rectal bleeding 12/10/2012   Chest pain 05/30/2012   Elevated lipids 05/30/2012   Palpitations 05/30/2012    Dyspnea 05/30/2012   Obese 05/30/2012   Prostate cancer ([place]) 08/13/2011    Past Surgical History:  Procedure Laterality Date   COLONOSCOPY N/A 12/19/2012   [id number] 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 Normal appearing colon   KIDNEY SURGERY  1982   RADIOACTIVE SEED IMPLANT     Prostate        Home Medications    Prior to Admission medications   Medication Sig Start Date End Date Taking? Authorizing Provider  amLODipine (NORVASC) 5 MG tablet Take 1 tablet (5 mg total) by mouth daily. 04/09/19 07/08/19 Yes Strader, Fransisco Hertz, PA-C  atorvastatin (LIPITOR) 20 MG tablet Take 1 tablet by mouth daily. 12/17/18  Yes [provider]  Cholecalciferol (VITAMIN D3) 125 MCG (5000 UT) CAPS Take 1 capsule by mouth daily.   Yes [provider]  Cyanocobalamin (B-12 PO) Take 1 tablet by mouth daily.   Yes [provider]  hydrocortisone (ANUSOL-HC) 2.5 % rectal cream Place 1 application rectally 2 (two) times daily. Use up to 10 days at a time. Patient taking differently: Place 1 application rectally 2 (two) times daily as needed for hemorrhoids or anal itching. Use up to 10 days at a time. 02/11/19  Yes Carlis Stable, NP  losartan-hydrochlorothiazide (HYZAAR) 100-25 MG tablet Take 1 tablet by mouth daily. 02/11/19  Yes [provider]  metoprolol tartrate (LOPRESSOR) 25 MG tablet Take 0.5 tablets (12.5 mg total) by mouth 2 (two) times daily. 03/02/19  Yes Eugenie Filler, MD  Omega-3 Fatty Acids (FISH OIL) 1000 MG CAPS Take 1 capsule by mouth daily.   Yes [provider]  potassium chloride SA (K-DUR) 20 MEQ tablet Take 1 tablet by mouth daily. 10/29/18  Yes [provider]  rivaroxaban (XARELTO) 20 MG TABS tablet Take 1 tablet (20 mg total) by mouth daily with supper. 03/02/19  Yes Eugenie Filler, MD  vitamin C (ASCORBIC ACID) 500 MG tablet Take 500 mg by mouth daily.   Yes  [provider]  VITAMIN E PO Take 1 tablet by mouth daily.   Yes [provider]    Family History Family History  Problem Relation Age of Onset   Aneurysm Mother    Prostate cancer Father    Colon cancer Neg Hx    Colon polyps Neg Hx     Social History Social History   Tobacco Use   Smoking status: Former Smoker    Packs/day: 1.00    Years: 20.00    Pack years: 20.00    Types: Cigarettes   Smokeless tobacco: Former Systems developer   Tobacco comment: Quit smoking x 2 years    07/2015   SOMETIIMES i USE VAPOR   Substance Use Topics   Alcohol use: Yes    Comment: rarely   Drug use: No     Allergies   Patient has no known allergies.   Review of Systems Review of Systems  Constitutional: Negative for chills and fever.  HENT: Negative for ear pain and sore throat.   Eyes: Negative for pain and visual disturbance.  Respiratory: Negative for cough and shortness of breath.   Cardiovascular: Positive for palpitations. Negative for chest pain.  Gastrointestinal: Negative for abdominal pain, nausea and vomiting.  Genitourinary: Negative for dysuria and hematuria.  Musculoskeletal: Negative for arthralgias and back pain.  Skin: Negative for color change and rash.  Neurological: Negative for seizures and syncope.  All other systems reviewed and are negative.    Physical Exam Updated Vital Signs BP (!) 156/97    Pulse 63    Temp 98.4 F (36.9 C) (Oral)    Resp 16    Ht 5\' 6"  (1.676 m)    Wt 100.2 kg    SpO2 99%    BMI 35.67 kg/m   Physical Exam Vitals signs and nursing note reviewed.  Constitutional:      General: He is in acute distress.     Appearance: Normal appearance. He is well-developed. He is not ill-appearing.  HENT:     Head: Normocephalic and atraumatic.  Eyes:     General: No scleral icterus.    Pupils: Pupils are equal, round, and reactive to light.  Neck:     Musculoskeletal: Normal range of motion.  Cardiovascular:     Rate and  Rhythm: Tachycardia present. Rhythm irregular.     Heart sounds: Normal heart sounds.     Comments: No bilateral pitting edema. Pulmonary:     Effort: Pulmonary effort is normal.     Breath sounds: Normal breath sounds. No wheezing.     Comments: Lungs are clear to auscultation. Chest:     Chest wall: No tenderness.  Abdominal:     General: Bowel sounds are normal. There is no distension.     Palpations: Abdomen is soft.     Tenderness:  There is no abdominal tenderness.  Musculoskeletal:        General: No tenderness or deformity.     Right lower leg: No edema.     Left lower leg: No edema.  Skin:    General: Skin is warm and dry.  Neurological:     Mental Status: He is alert and oriented to person, place, and time.      ED Treatments / Results  Labs (all labs ordered are listed, but only abnormal results are displayed) Labs Reviewed  BASIC METABOLIC PANEL - Abnormal; Notable for the following components:      Result Value   Potassium 3.3 (*)    Glucose, Bld 110 (*)    All other components within normal limits  URINALYSIS, ROUTINE W REFLEX MICROSCOPIC - Abnormal; Notable for the following components:   Color, Urine STRAW (*)    Specific Gravity, Urine 1.003 (*)    All other components within normal limits  CBC WITH DIFFERENTIAL/PLATELET  PROTIME-INR  MAGNESIUM  TROPONIN I (HIGH SENSITIVITY)  TROPONIN I (HIGH SENSITIVITY)    EKG EKG Interpretation  Date/Time:  Thursday April 17 2019 20:02:53 EDT Ventricular Rate:  64 PR Interval:    QRS Duration: 86 QT Interval:  406 QTC Calculation: 419 R Axis:   3 Text Interpretation:  Sinus rhythm Atrial premature complex Anteroseptal infarct, old Borderline T abnormalities, inferior leads When compared with ECG of 03/26/2018 No significant change was found Confirmed by Francine Graven [phone]) on 04/17/2019 8:16:03 PM   Radiology Dg Chest 2 View  Result Date: 04/17/2019 CLINICAL DATA:  Near-syncope. EXAM: CHEST - 2  VIEW COMPARISON:  Chest x-ray dated March 01, 2019. FINDINGS: The heart size and mediastinal contours are within normal limits. Both lungs are clear. The visualized skeletal structures are unremarkable. IMPRESSION: No active cardiopulmonary disease. Electronically Signed   By: Titus Dubin M.D.   On: 04/17/2019 19:08    Procedures Procedures (including critical care time)  Medications Ordered in ED Medications  metoprolol tartrate (LOPRESSOR) tablet 25 mg (25 mg Oral Not Given 04/17/19 2022)  sodium chloride 0.9 % bolus 1,000 mL (1,000 mLs Intravenous New Bag/Given 04/17/19 1912)  rivaroxaban (XARELTO) tablet 20 mg (20 mg Oral Given 04/17/19 2004)     Initial Impression / Assessment and Plan / ED Course  I have reviewed the triage vital signs and the nursing notes.  Pertinent labs & imaging results that were available during my care of the patient were reviewed by me and considered in my medical decision making (see chart for details).       Patient with a recent diagnosis of atrial flutter presents to the ED with complaints of dizziness, tachycardia.  Patient reports he was outside this evening briefly when he began to feel dizzy, broke out in a cold sweat upon entering his home.  He was recently diagnosed with atrial flutter was placed on Xarelto, metoprolol reports he missed his dose of metoprolol this morning but has been taking Xarelto daily.  He arrived in the ED with a rate of 146.  During my primary evaluation patient is in acute distress, he reports feeling dizzy along with has his eyes closed during my interview.  His lungs are clear to auscultation.  EKG was remarkable for atrial flutter.  We will provide him with a metoprolol push 5 mg to help rate control him.  Nursing staff reported patient's rate has come down to the 70s without any medication, due to shift change Metroprolol push was  never given.  BMP show hypokalemia, this is chronic for patient and he reports being on  supplementation.  Kidney function is within normal limits no other electrolyte derangement.  CBC without any leukocytosis, no hemoglobin.  PT and INR within normal limits.  First troponin was negative, will delta troponin.  UA without any nitrites, leukocytes, denies any urinary symptoms at this time.  Magnesium remains pending.  7:50 PM Spoke to cardiology on call who recommended patient likely arrive in atrial flutter, will need a repeat EKG along with a referral to EP lab on an urgent basis.  Patient will be scheduled to follow-up with them in office next week.  Of note, patient had a visit last week, he reports he has been doing well aside from this episode which occurred today.   Repeat EKG showed sinus rhythm, patient has yet to receive his metoprolol while in the ED.  Delta troponin was noted to be 55, this is significantly elevated since his previous 1 2 hours ago that was 11.  Will place call for cardiology pending their recommendations.  Magnesium level within normal limits.  9:22 PM Spoke to Dr. Hassell Done cardiologist who reported positive troponin likely consistent with RVR.  However, will trend troponin down with overnight obs.  Will place call for hospitalist admission.   9:50 PM spoke to hospitalist service who will admit patient for a chest pain observation.  Patient has been updated of this plan.  Agrees with management.   Portions of this note were generated with Lobbyist. Dictation errors may occur despite best attempts at proofreading.  Final Clinical Impressions(s) / ED Diagnoses   Final diagnoses:  Atrial flutter, unspecified type [facility])  Dizziness    ED Discharge Orders    None       Janeece Fitting, PA-C 04/17/19 2151    Francine Graven, DO 04/22/19 1115

## 2019-04-17 NOTE — Discharge Instructions (Addendum)
1) stop HCTZ/hydrochlorthiazide due to recurrent and persistent low potassium- 2) take potassium supplements as follows- Take 2 tablets (40 Meq) daily for the next 3 days then 1 tablet (20 Meq) daily for the next 5 days and stop the potassium supplements 3) increase Metoprolol to 25 mg twice a day for better heart rate and blood pressure control--- call if dizziness or shortness of breath with activity 4)Please continue Losartan and Amlodipine for blood pressure control 5)You are taking Xarelto for blood thinner so Avoid ibuprofen/Advil/Aleve/Motrin/Goody Powders/Naproxen/BC powders/Meloxicam/Diclofenac/Indomethacin and other Nonsteroidal anti-inflammatory medications as these will make you more likely to bleed and can cause stomach ulcers, can also cause Kidney problems.  6)Follow-up with primary care physician or Cardiology provider in about 1 week.

## 2019-04-18 ENCOUNTER — Encounter (HOSPITAL_COMMUNITY): Payer: Self-pay

## 2019-04-18 DIAGNOSIS — I10 Essential (primary) hypertension: Secondary | ICD-10-CM | POA: Diagnosis not present

## 2019-04-18 DIAGNOSIS — E876 Hypokalemia: Secondary | ICD-10-CM

## 2019-04-18 DIAGNOSIS — I4892 Unspecified atrial flutter: Secondary | ICD-10-CM | POA: Diagnosis not present

## 2019-04-18 LAB — TROPONIN I (HIGH SENSITIVITY): Troponin I (High Sensitivity): 43 ng/L — ABNORMAL HIGH (ref <18)

## 2019-04-18 MED ORDER — ACETAMINOPHEN 650 MG RE SUPP: 650.0000 mg | Freq: Four times a day (QID) | RECTAL | Status: DC | PRN

## 2019-04-18 MED ORDER — LOSARTAN POTASSIUM 50 MG PO TABS
100.0000 mg | ORAL_TABLET | Freq: Every day | ORAL | Status: DC
  Administered 2019-04-18: 100 mg via ORAL
  Filled 2019-04-18: qty 4

## 2019-04-18 MED ORDER — ONDANSETRON HCL 4 MG/2ML IJ SOLN: 4.0000 mg | Freq: Four times a day (QID) | INTRAMUSCULAR | Status: DC | PRN

## 2019-04-18 MED ORDER — POTASSIUM CHLORIDE CRYS ER 20 MEQ PO TBCR
40.0000 meq | EXTENDED_RELEASE_TABLET | Freq: Once | ORAL | Status: AC
  Administered 2019-04-18: 11:00:00 40 meq via ORAL
  Filled 2019-04-18: qty 2

## 2019-04-18 MED ORDER — ACETAMINOPHEN 325 MG PO TABS: 650.0000 mg | ORAL_TABLET | Freq: Four times a day (QID) | ORAL | Status: DC | PRN

## 2019-04-18 MED ORDER — ATORVASTATIN CALCIUM 20 MG PO TABS: 20.0000 mg | ORAL_TABLET | ORAL | 5 refills | Status: DC

## 2019-04-18 MED ORDER — AMLODIPINE BESYLATE 5 MG PO TABS
5.0000 mg | ORAL_TABLET | Freq: Every day | ORAL | Status: DC
  Administered 2019-04-18: 5 mg via ORAL
  Filled 2019-04-18: qty 1

## 2019-04-18 MED ORDER — METOPROLOL TARTRATE 25 MG PO TABS: 25.0000 mg | ORAL_TABLET | Freq: Two times a day (BID) | ORAL | 2 refills | Status: DC

## 2019-04-18 MED ORDER — POTASSIUM CHLORIDE CRYS ER 20 MEQ PO TBCR: 20.0000 meq | EXTENDED_RELEASE_TABLET | ORAL | 0 refills | Status: DC

## 2019-04-18 MED ORDER — POLYETHYLENE GLYCOL 3350 17 G PO PACK
17.0000 g | PACK | Freq: Every day | ORAL | Status: DC | PRN
  Administered 2019-04-18: 09:00:00 17 g via ORAL
  Filled 2019-04-18: qty 1

## 2019-04-18 MED ORDER — LOSARTAN POTASSIUM-HCTZ 100-25 MG PO TABS: 1.0000 | ORAL_TABLET | Freq: Every day | ORAL | Status: DC

## 2019-04-18 MED ORDER — ONDANSETRON HCL 4 MG PO TABS: 4.0000 mg | ORAL_TABLET | Freq: Four times a day (QID) | ORAL | Status: DC | PRN

## 2019-04-18 MED ORDER — METOPROLOL TARTRATE 25 MG PO TABS
12.5000 mg | ORAL_TABLET | Freq: Two times a day (BID) | ORAL | Status: DC
  Administered 2019-04-18: 09:00:00 12.5 mg via ORAL
  Filled 2019-04-18: qty 1

## 2019-04-18 MED ORDER — LOSARTAN POTASSIUM 100 MG PO TABS: 100.0000 mg | ORAL_TABLET | Freq: Every day | ORAL | 4 refills | Status: DC

## 2019-04-18 MED ORDER — HYDROCHLOROTHIAZIDE 25 MG PO TABS
25.0000 mg | ORAL_TABLET | Freq: Every day | ORAL | Status: DC
  Administered 2019-04-18: 25 mg via ORAL
  Filled 2019-04-18: qty 1

## 2019-04-18 MED ORDER — POTASSIUM CHLORIDE CRYS ER 20 MEQ PO TBCR
20.0000 meq | EXTENDED_RELEASE_TABLET | Freq: Every day | ORAL | Status: DC
  Administered 2019-04-18: 09:00:00 20 meq via ORAL
  Filled 2019-04-18: qty 1

## 2019-04-18 MED ORDER — RIVAROXABAN 20 MG PO TABS
20.0000 mg | ORAL_TABLET | Freq: Every day | ORAL | Status: DC
  Filled 2019-04-18 (×3): qty 1

## 2019-04-18 NOTE — Discharge Summary (Signed)
Steven [name], is a 68 y.o. male  DOB 1950/08/31  MRN [id number].  Admission date:  04/17/2019  Admitting Physician  Steven Roys, MD  Discharge Date:  04/18/2019   Primary MD  Steven Samples, PA-C  Recommendations for primary care physician for things to follow:   - 1) stop HCTZ/hydrochlorthiazide due to recurrent and persistent low potassium- 2) take potassium supplements as follows- Take 2 tablets (40 Meq) daily for the next 3 days then 1 tablet (20 Meq) daily for the next 5 days and stop the potassium supplements 3) increase Metoprolol to 25 mg twice a day for better heart rate and blood pressure control--- call if dizziness or shortness of breath with activity 4)Please continue Losartan and Amlodipine for blood pressure control 5)You are taking Xarelto for blood thinner so Avoid ibuprofen/Advil/Aleve/Motrin/Goody Powders/Naproxen/BC powders/Meloxicam/Diclofenac/Indomethacin and other Nonsteroidal anti-inflammatory medications as these will make you more likely to bleed and can cause stomach ulcers, can also cause Kidney problems.  6)Follow-up with primary care physician or Cardiology provider in about 1 week.   Admission Diagnosis  Dizziness [R42] Atrial flutter, unspecified type [name]) [I48.92]  Discharge Diagnosis  Dizziness [R42] Atrial flutter, unspecified type ([name]) [I48.92]    Active Problems:   Atrial flutter, paroxysmal ([facility])   HTN (hypertension)   Hypokalemia      Past Medical History:  Diagnosis Date  . Arthritis   . Atrial fibrillation ([place])   . Atrial flutter ([place])   . Cancer [facility])    prostate s/p radiation Mar 2013  . Chest pain   . Fracture 08/17/2015   MULTIPLE RIB FRACTURES     FROM FALL   . History of radiation therapy 08/22/11-10/13/11   prostate  . Hyperlipemia   . Hypertension     Past Surgical History:  Procedure Laterality Date  . COLONOSCOPY N/A  12/19/2012   [id number] 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 Normal appearing colon  . [place]  . RADIOACTIVE SEED IMPLANT     Prostate     HPI  from the history and physical done on the day of admission:    - Chief Complaint: Dizziness  HPI: Steven [name] is a 68 y.o. male with medical history significant for hypertension, prostate cancer, recent a flutter diagnosis.  Patient was working outside today when he suddenly became dizzy, and sweaty.  Similar to the episode he had earlier this month when he was diagnosed with atrial fibrillation/flutter.  Patient is on anticoagulation with Xarelto.  He reports compliance.  But he did not take his metoprolol dose this morning. Patient denies chest pain, no difficulty breathing.  Reports alcohol intake on social occasions only.  No fever no chills.  Recent hospitalization 8/8 - 8/9-for paroxysmal atrial flutter/atrial fibrillation.  Chads Vasc Score 2.  Patient self converted to sinus rhythm spontaneously at that time also.  EF 60 - 65% without wall motion abnormalities.  Patient was started on metoprolol and Xarelto prior to  discharge.  Since discharge patient has been doing well, he followed up with cardiology 03/12/2019.  ED Course: Heart rate initially 146, patient subsequently self converted to sinus rhythm with rate 60s to 70s, without intervention.  Blood pressure systolic [id number] to [id number].  At bedtime troponin 11 >> 55. EDP talked to cardiology on call, Steven [name]-recommendations here for an observation admission.  Trend troponin overnight.  Elevated troponin likely consistent with RVR but need to rule out ACS.  Review of Systems: As per HPI all other systems reviewed and negative   Hospital Course:     1)Paroxysmal Atrial flutter/PAFib---in the setting of persistent hypokalemia, --patient has been compliant with Xarelto he did miss metoprolol x1 time`.  Patient is  back in sinus rhythm again (spontaneously converted).  Stop HCTZ due to persistent hypokalemia, -Increase metoprolol to 25 mg twice daily from 12.5 mg twice daily -Magnesium is normal , replace potassium -Follow-up with cardiology post discharge for recheck and reevaluation -Chads Vasc Score 2, -Continue Xarelto  2) elevated troponin--suspect demand ischemia in the setting of atrial flutter with RVR--- troponins have trended down (55>58>43).  -EKG without ischemic type changes -EchoCardiogram from 03/02/2019 with EF of 60 to 65% -Patient is chest pain-free, no dyspnea no dizziness  3)HTN--stop HCTZ due to persistent hypokalemia in the setting of atrial tachyarrhythmia, continue amlodipine and losartan increase metoprolol to 25 mg p.o. twice daily -Follow-up with cardiologist for evaluation  4)H/o prostate cancer--status post previous radiation in 2013  Discharge Condition:  stable  Follow UP  Follow-up Information    Schedule an appointment as soon as possible for a visit  with Steven Hector, MD.   Specialty: Cardiology Contact information: [id number] [place] [address] [phone]           Diet and Activity recommendation:  As advised  Discharge Instructions    Discharge Instructions    Call MD for:  difficulty breathing, headache or visual disturbances   Complete by: As directed    Call MD for:  persistant dizziness or light-headedness   Complete by: As directed    Call MD for:  persistant nausea and vomiting   Complete by: As directed    Call MD for:  severe uncontrolled pain   Complete by: As directed    Call MD for:  temperature >100.4   Complete by: As directed    Diet - low sodium heart healthy   Complete by: As directed    Discharge instructions   Complete by: As directed    1) stop HCTZ/hydrochlorthiazide due to recurrent and persistent low potassium- 2) take potassium supplements as follows- Take 2 tablets (40 Meq) daily for the  next 3 days then 1 tablet (20 Meq) daily for the next 5 days and stop the potassium supplements 3) increase Metoprolol to 25 mg twice a day for better heart rate and blood pressure control--- call if dizziness or shortness of breath with activity 4)Please continue Losartan and Amlodipine for blood pressure control 5)You are taking Xarelto for blood thinner so Avoid ibuprofen/Advil/Aleve/Motrin/Goody Powders/Naproxen/BC powders/Meloxicam/Diclofenac/Indomethacin and other Nonsteroidal anti-inflammatory medications as these will make you more likely to bleed and can cause stomach ulcers, can also cause Kidney problems.  6)Follow-up with primary care physician or Cardiology provider in about 1 week.   Increase activity slowly   Complete by: As directed       Discharge Medications     Allergies as of 04/18/2019   No Known Allergies  Medication List    STOP taking these medications   losartan-hydrochlorothiazide 100-25 MG tablet Commonly known as: HYZAAR     TAKE these medications   amLODipine 5 MG tablet Commonly known as: NORVASC Take 1 tablet (5 mg total) by mouth daily.   atorvastatin 20 MG tablet Commonly known as: LIPITOR Take 1 tablet (20 mg total) by mouth every Tuesday, Thursday, Saturday, and Sunday. Start taking on: April 19, 2019 What changed: when to take this   B-12 PO Take 1 tablet by mouth daily.   Fish Oil 1000 MG Caps Take 1 capsule by mouth daily.   hydrocortisone 2.5 % rectal cream Commonly known as: ANUSOL-HC Place 1 application rectally 2 (two) times daily. Use up to 10 days at a time. What changed:   when to take this  reasons to take this   losartan 100 MG tablet Commonly known as: COZAAR Take 1 tablet (100 mg total) by mouth daily. Start taking on: April 19, 2019   metoprolol tartrate 25 MG tablet Commonly known as: LOPRESSOR Take 1 tablet (25 mg total) by mouth 2 (two) times daily. What changed: how much to take   potassium  chloride SA 20 MEQ tablet Commonly known as: K-DUR Take 1 tablet (20 mEq total) by mouth See admin instructions. Take 2 tablets (40 Meq) daily for the next 3 days then 1 tablet (20 Meq) daily for the next 5 days and stop What changed:   when to take this  additional instructions   rivaroxaban 20 MG Tabs tablet Commonly known as: XARELTO Take 1 tablet (20 mg total) by mouth daily with supper.   vitamin C 500 MG tablet Commonly known as: ASCORBIC ACID Take 500 mg by mouth daily.   Vitamin D3 125 MCG (5000 UT) Caps Take 1 capsule by mouth daily.   VITAMIN E PO Take 1 tablet by mouth daily.      Major procedures and Radiology Reports - PLEASE review detailed and final reports for all details, in brief -   Dg Chest 2 View  Result Date: 04/17/2019 CLINICAL DATA:  Near-syncope. EXAM: CHEST - 2 VIEW COMPARISON:  Chest x-ray dated March 01, 2019. FINDINGS: The heart size and mediastinal contours are within normal limits. Both lungs are clear. The visualized skeletal structures are unremarkable. IMPRESSION: No active cardiopulmonary disease. Electronically Signed   By: Titus Dubin M.D.   On: 04/17/2019 19:08    Micro Results   No results found for this or any previous visit (from the past 240 hour(s)).  Today   Subjective  [name] today has no new concerns ---      Patient is chest pain-free, no dyspnea no dizziness -He is asymptomatic with ambulation   Patient has been seen and examined prior to discharge   Objective   Blood pressure (!) 154/82, pulse (!) 57, temperature 98.1 F (36.7 C), temperature source Oral, resp. rate 18, height 5\' 6"  (1.676 m), weight 100.3 kg, SpO2 99 %.   Intake/Output Summary (Last 24 hours) at 04/18/2019 1103 Last data filed at 04/18/2019 1000 Gross per 24 hour  Intake 1000 ml  Output 400 ml  Net 600 ml   Exam Gen:- Awake Alert, no acute distress  HEENT:- [place].AT, No sclera icterus Neck-Supple Neck,No JVD,.  Lungs-  CTAB , good air  movement bilaterally  CV- S1, S2 normal, regular Abd-  +ve B.Sounds, Abd Soft, No tenderness,    Extremity/Skin:- No  edema,   good pulses Psych-affect is appropriate, oriented  x3 Neuro-no new focal deficits, no tremors    Data Review   CBC w Diff:  Lab Results  Component Value Date   WBC 4.4 04/17/2019   HGB 15.4 04/17/2019   HCT 47.1 04/17/2019   PLT 235 04/17/2019   LYMPHOPCT 38 04/17/2019   MONOPCT 8 04/17/2019   EOSPCT 2 04/17/2019   BASOPCT 1 04/17/2019   CMP:  Lab Results  Component Value Date   NA 140 04/17/2019   K 3.3 (L) 04/17/2019   CL 103 04/17/2019   CO2 26 04/17/2019   BUN 12 04/17/2019   CREATININE 0.94 04/17/2019   PROT 6.8 03/02/2019   ALBUMIN 3.6 03/02/2019   BILITOT 0.6 03/02/2019   ALKPHOS 51 03/02/2019   AST 20 03/02/2019   ALT 20 03/02/2019  .  Total Discharge time is about 33 minutes  Roxan Hockey M.D on 04/18/2019 at 11:03 AM  Go to [email or web address] -  for contact info  [facility] - Office  [phone]

## 2019-04-18 NOTE — Progress Notes (Signed)
Pt IV removed, WNL. D/C instructions given to pt. Verbalized understanding. Awaiting ride home.

## 2019-04-26 NOTE — Progress Notes (Signed)
Cardiology Office Note    Date:  04/29/2019   ID:  SACARIO REETZ, DOB May 06, 1951, MRN [id number]  PCP:  Jake Samples, PA-C  Cardiologist: Jenkins Rouge, MD    No chief complaint on file.   History of Present Illness:    Steven [name] is a 68 y.o. male with past medical history of paroxysmal atrial flutter/fibrillation, HTN, and HLD who presents to the office today for follow-up.He has had PAF with spontaneous conversions most recently 04/17/19 and before that in August. He has intermittent dizziness not always related to arrhythmia Event monitor in September before admission showed no recurrent PAF dated 04/09/19 and reviewed Post hospital diuretic was d/c due to hypokalemia CRF;s include HLD And HTN.  Amlodipine causes edema echo done 03/02/19 showed EF 60-65% with no valve disease and normal LA size at 34 mm CHADVASC is 2 He has not had ischemic testing   Multitude of somatic complaints and anxiety. Headaches and dizziness persist despite no arrhythmias GI issues with IBS and nausea with epigastric pain and constipation Feels weak and tired     Past Medical History:  Diagnosis Date  . Arthritis   . Atrial fibrillation ([place])   . Atrial flutter ([place])   . Cancer [facility])    prostate s/p radiation Mar 2013  . Chest pain   . Fracture 08/17/2015   MULTIPLE RIB FRACTURES     FROM FALL   . History of radiation therapy 08/22/11-10/13/11   prostate  . Hyperlipemia   . Hypertension     Past Surgical History:  Procedure Laterality Date  . 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 Normal appearing colon  . [place]  . RADIOACTIVE SEED IMPLANT     Prostate    Current Medications: Outpatient Medications Prior to Visit  Medication Sig Dispense Refill  . amLODipine (NORVASC) 10 MG tablet Take 1 tablet (10 mg total) by mouth daily. 30 tablet 6  . atorvastatin (LIPITOR) 20 MG  tablet Take 1 tablet (20 mg total) by mouth every Tuesday, Thursday, Saturday, and Sunday. 20 tablet 5  . Cholecalciferol (VITAMIN D3) 125 MCG (5000 UT) CAPS Take 1 capsule by mouth daily.    . Cyanocobalamin (B-12 PO) Take 1 tablet by mouth daily.    . hydrocortisone (ANUSOL-HC) 2.5 % rectal cream Place 1 application rectally 2 (two) times daily. Use up to 10 days at a time. (Patient taking differently: Place 1 application rectally 2 (two) times daily as needed for hemorrhoids or anal itching. Use up to 10 days at a time.) 30 g 2  . losartan (COZAAR) 100 MG tablet Take 1 tablet (100 mg total) by mouth daily. 30 tablet 4  . metoprolol tartrate (LOPRESSOR) 25 MG tablet Take 1 tablet (25 mg total) by mouth 2 (two) times daily. 60 tablet 2  . Omega-3 Fatty Acids (FISH OIL) 1000 MG CAPS Take 1 capsule by mouth daily.    . potassium chloride SA (KLOR-CON) 20 MEQ tablet Take 1 tablet (20 mEq total) by mouth daily. 30 tablet 0  . rivaroxaban (XARELTO) 20 MG TABS tablet Take 1 tablet (20 mg total) by mouth daily with supper. 30 tablet 1  . vitamin C (ASCORBIC ACID) 500 MG tablet Take 500 mg by mouth daily.    Marland Kitchen VITAMIN E PO Take 1 tablet by mouth daily.     No facility-administered medications prior to visit.  Allergies:   Patient has no known allergies.   Social History   Socioeconomic History  . Marital status: Single    Spouse name: Not on file  . Number of children: Not on file  . Years of education: Not on file  . Highest education level: Not on file  Occupational History  . Occupation: Custodian     Employer: [employer]  Social Needs  . Financial resource strain: Not on file  . Food insecurity    Worry: Not on file    Inability: Not on file  . Transportation needs    Medical: Not on file    Non-medical: Not on file  Tobacco Use  . Smoking status: Former Smoker    Packs/day: 1.00    Years: 20.00    Pack years: 20.00    Types: Cigarettes  . Smokeless tobacco:  Former Systems developer  . Tobacco comment: Quit smoking x 2 years    07/2015   SOMETIIMES i USE VAPOR   Substance and Sexual Activity  . Alcohol use: Yes    Comment: rarely  . Drug use: No  . Sexual activity: Not on file  Lifestyle  . Physical activity    Days per week: Not on file    Minutes per session: Not on file  . Stress: Not on file  Relationships  . Social Herbalist on phone: Not on file    Gets together: Not on file    Attends religious service: Not on file    Active member of club or organization: Not on file    Attends meetings of clubs or organizations: Not on file    Relationship status: Not on file  Other Topics Concern  . Not on file  Social History Narrative   Divorced     Family History:  The patient's family history includes Aneurysm in his mother; Prostate cancer in his father.   Review of Systems:   Please see the history of present illness.     General:  No chills, fever, night sweats or weight changes.  Cardiovascular:  No chest pain, dyspnea on exertion, edema, orthopnea, palpitations, paroxysmal nocturnal dyspnea. Dermatological: No rash, lesions/masses Respiratory: No cough, dyspnea Urologic: No hematuria, dysuria Abdominal:   No nausea, vomiting, diarrhea, bright red blood per rectum, melena, or hematemesis Neurologic:  No visual changes, wkns, changes in mental status. Positive for dizziness and headaches.   All other systems reviewed and are otherwise negative except as noted above.   Physical Exam:    VS:  BP (!) 160/84   Pulse 66   Temp (!) 97.1 F (36.2 C) (Temporal)   Ht 5\' 6"  (1.676 m)   Wt 219 lb (99.3 kg)   SpO2 98%   BMI 35.35 kg/m    Affect appropriate Healthy:  appears stated age HEENT: normal Neck supple with no adenopathy JVP normal no bruits no thyromegaly Lungs clear with no wheezing and good diaphragmatic motion Heart:  S1/S2 no murmur, no rub, gallop or click PMI normal Abdomen: benighn, BS positve, no tenderness,  no AAA no bruit.  No HSM or HJR Distal pulses intact with no bruits No edema Neuro non-focal Skin warm and dry No muscular weakness   Wt Readings from Last 3 Encounters:  04/29/19 219 lb (99.3 kg)  04/27/19 220 lb (99.8 kg)  04/18/19 221 lb 1.9 oz (100.3 kg)     Studies/Labs Reviewed:   EKG:  04/18/19 SR broad P wave LVH rate 56  with PVC   Recent Labs: 03/02/2019: ALT 20; TSH 0.843 04/17/2019: Magnesium 2.2 04/27/2019: BUN 11; Creatinine, Ser 0.76; Hemoglobin 14.1; Platelets 218; Potassium 3.2; Sodium 136   Lipid Panel    Component Value Date/Time   CHOL 142 03/02/2019 0329   TRIG 89 03/02/2019 0329   HDL 32 (L) 03/02/2019 0329   CHOLHDL 4.4 03/02/2019 0329   VLDL 18 03/02/2019 0329   LDLCALC 92 03/02/2019 0329    Additional studies/ records that were reviewed today include:   Echocardiogram: 03/02/2019 IMPRESSIONS   1. The left ventricle has normal systolic function with an ejection fraction of 60-65%. The cavity size was normal. Left ventricular diastolic parameters were normal.  2. The right ventricle has normal systolic function. The cavity was normal. There is no increase in right ventricular wall thickness.  3. Mild thickening of the mitral valve leaflet.  4. The aortic valve is tricuspid. Mild thickening of the aortic valve. Mild calcification of the aortic valve.  5. The aorta is normal in size and structure.  Monitor:  04/09/19 NSR no PAF   Assessment:    PAF  Plan:   In order of problems listed above:  1. Paroxysmal Atrial Flutter - continue xarelto Lopressor increased to 25 bid recently stable   2. HTN - resume diuretic with K supplementation  3. HLD - followed by PCP. FLP during recent admission showed total cholesterol 142, triglycerides 89, HDL 32, and LDL 92. He remains on Atorvastatin 20 mg daily. 4. GI:  Patient has multiple somatic complaints that sound like IBS. He has nausea with epigastric pain and constipation wanting to use enema. Mild  RUQ/epigastric pain on exam will order abdominal CT to r/o pathology Needs f/u with primary  5. Neuro continues to have headaches and dizziness that are not related to his heart or arrhythmia needs f/u with primary and neuro CT head 03/02/19 no acute issues benign 11 mm cyst in superior sagittal sinus   Will order exercise myovue to r/o CAD in context of dizziness and cardiac arrhythmias  F/u with me or Bernerd Pho PA in 6 months     Signed, Jenkins Rouge, MD  04/29/2019 9:43 AM    [facility]. [address] [place], [place] [address] Phone: [phone] Fax: [phone]

## 2019-04-27 ENCOUNTER — Other Ambulatory Visit: Payer: Self-pay

## 2019-04-27 ENCOUNTER — Encounter (HOSPITAL_COMMUNITY): Payer: Self-pay | Admitting: *Deleted

## 2019-04-27 ENCOUNTER — Emergency Department (HOSPITAL_COMMUNITY)
Admission: EM | Admit: 2019-04-27 | Discharge: 2019-04-27 | Disposition: A | Discharge status: Home / Self Care | Payer: BC Managed Care – PPO | Attending: Emergency Medicine | Admitting: Emergency Medicine

## 2019-04-27 DIAGNOSIS — I10 Essential (primary) hypertension: Secondary | ICD-10-CM | POA: Insufficient documentation

## 2019-04-27 DIAGNOSIS — E876 Hypokalemia: Secondary | ICD-10-CM | POA: Insufficient documentation

## 2019-04-27 DIAGNOSIS — Z79899 Other long term (current) drug therapy: Secondary | ICD-10-CM | POA: Diagnosis not present

## 2019-04-27 DIAGNOSIS — Z8546 Personal history of malignant neoplasm of prostate: Secondary | ICD-10-CM | POA: Diagnosis not present

## 2019-04-27 DIAGNOSIS — R030 Elevated blood-pressure reading, without diagnosis of hypertension: Secondary | ICD-10-CM | POA: Diagnosis present

## 2019-04-27 DIAGNOSIS — Z87891 Personal history of nicotine dependence: Secondary | ICD-10-CM | POA: Diagnosis not present

## 2019-04-27 LAB — BASIC METABOLIC PANEL
Anion gap: 7 (ref 5–15)
BUN: 11 mg/dL (ref 8–23)
CO2: 25 mmol/L (ref 22–32)
Calcium: 9 mg/dL (ref 8.9–10.3)
Chloride: 104 mmol/L (ref 98–111)
Creatinine, Ser: 0.76 mg/dL (ref 0.61–1.24)
GFR calc Af Amer: >60 mL/min (ref >60)
GFR calc non Af Amer: >60 mL/min (ref >60)
Glucose, Bld: 106 mg/dL — ABNORMAL HIGH (ref 70–99)
Potassium: 3.2 mmol/L — ABNORMAL LOW (ref 3.5–5.1)
Sodium: 136 mmol/L (ref 135–145)

## 2019-04-27 LAB — CBC
HCT: 43.1 % (ref 39.0–52.0)
Hemoglobin: 14.1 g/dL (ref 13.0–17.0)
MCH: 29.2 pg (ref 26.0–34.0)
MCHC: 32.7 g/dL (ref 30.0–36.0)
MCV: 89.2 fL (ref 80.0–100.0)
Platelets: 218 10*3/uL (ref 150–400)
RBC: 4.83 MIL/uL (ref 4.22–5.81)
RDW: 12.4 % (ref 11.5–15.5)
WBC: 5.3 10*3/uL (ref 4.0–10.5)
nRBC: 0 % (ref 0.0–0.2)

## 2019-04-27 MED ORDER — POTASSIUM CHLORIDE CRYS ER 20 MEQ PO TBCR: 20.0000 meq | EXTENDED_RELEASE_TABLET | Freq: Every day | ORAL | 0 refills | Status: DC

## 2019-04-27 MED ORDER — CLONIDINE HCL 0.2 MG PO TABS
0.2000 mg | ORAL_TABLET | Freq: Once | ORAL | Status: AC
  Administered 2019-04-27: 03:00:00 0.2 mg via ORAL
  Filled 2019-04-27: qty 1

## 2019-04-27 MED ORDER — AMLODIPINE BESYLATE 10 MG PO TABS: 10.0000 mg | ORAL_TABLET | Freq: Every day | ORAL | 6 refills | Status: DC

## 2019-04-27 NOTE — ED Provider Notes (Signed)
[facility] EMERGENCY DEPARTMENT Provider Note   CSN: [id number] Arrival date & time: 04/27/19  0255     History   Chief Complaint Chief Complaint  Patient presents with  . Hypertension    HPI Steven [name] is a 68 y.o. male.     Patient presents to the emergency department for evaluation of elevated blood pressure.  Patient reports that his blood pressure has been going up and down since yesterday.  He reports that he gets swimmy headed and mild nausea when his blood pressure goes up.  No associated chest pain, heart palpitations or shortness of breath.  He was recently hospitalized for atrial fibrillation with rapid ventricular response, had his Lopressor increased and his hydrochlorothiazide stopped because of hypokalemia.  Pressures have been irregular since then.     Past Medical History:  Diagnosis Date  . Arthritis   . Atrial fibrillation ([place])   . Atrial flutter ([place])   . Cancer [facility])    prostate s/p radiation Mar 2013  . Chest pain   . Fracture 08/17/2015   MULTIPLE RIB FRACTURES     FROM FALL   . History of radiation therapy 08/22/11-10/13/11   prostate  . Hyperlipemia   . Hypertension     Patient Active Problem List   Diagnosis Date Noted  . Atrial flutter ([place]) 04/17/2019  . Atrial flutter, paroxysmal ([place]) 03/02/2019  . HTN (hypertension) 03/02/2019  . Elevated troponin 03/01/2019  . Hypokalemia 03/01/2019  . Constipation 08/02/2017  . Hemorrhoids 08/02/2017  . Fall 08/18/2015  . Open fracture of left elbow 08/18/2015  . Rib fractures 08/17/2015  . Radiation proctitis 01/30/2013  . Rectal bleeding 12/10/2012  . Chest pain 05/30/2012  . Elevated lipids 05/30/2012  . Palpitations 05/30/2012  . Dyspnea 05/30/2012  . Obese 05/30/2012  . Prostate cancer ([place]) 08/13/2011    Past Surgical History:  Procedure Laterality Date  . COLONOSCOPY N/A 12/19/2012   [id number] bleeding secondary to radiation induced proctitis  - status post APC ablation;  internal hemorrhoids. Normal appearing colon  . [place]  . RADIOACTIVE SEED IMPLANT     Prostate        Home Medications    Prior to Admission medications   Medication Sig Start Date End Date Taking? Authorizing Provider  amLODipine (NORVASC) 10 MG tablet Take 1 tablet (10 mg total) by mouth daily. 04/27/19   [name], MD  atorvastatin (LIPITOR) 20 MG tablet Take 1 tablet (20 mg total) by mouth every Tuesday, Thursday, Saturday, and Sunday. 04/19/19   Roxan Hockey, MD  Cholecalciferol (VITAMIN D3) 125 MCG (5000 UT) CAPS Take 1 capsule by mouth daily.    [provider]  Cyanocobalamin (B-12 PO) Take 1 tablet by mouth daily.    [provider]  hydrocortisone (ANUSOL-HC) 2.5 % rectal cream Place 1 application rectally 2 (two) times daily. Use up to 10 days at a time. Patient taking differently: Place 1 application rectally 2 (two) times daily as needed for hemorrhoids or anal itching. Use up to 10 days at a time. 02/11/19   Carlis Stable, NP  losartan (COZAAR) 100 MG tablet Take 1 tablet (100 mg total) by mouth daily. 04/19/19   Roxan Hockey, MD  metoprolol tartrate (LOPRESSOR) 25 MG tablet Take 1 tablet (25 mg total) by mouth 2 (two) times daily. 04/18/19   Roxan Hockey, MD  Omega-3 Fatty Acids (FISH OIL) 1000 MG CAPS Take 1 capsule by mouth daily.    [provider]  potassium chloride SA (KLOR-CON) 20 MEQ tablet Take 1 tablet (20 mEq total) by mouth daily. 04/27/19   [name], MD  rivaroxaban (XARELTO) 20 MG TABS tablet Take 1 tablet (20 mg total) by mouth daily with supper. 03/02/19   Eugenie Filler, MD  vitamin C (ASCORBIC ACID) 500 MG tablet Take 500 mg by mouth daily.    [provider]  VITAMIN E PO Take 1 tablet by mouth daily.    [provider]    Family History Family History  Problem Relation Age of Onset  . Aneurysm Mother   . Prostate cancer Father   . Colon cancer Neg Hx   .  Colon polyps Neg Hx     Social History Social History   Tobacco Use  . Smoking status: Former Smoker    Packs/day: 1.00    Years: 20.00    Pack years: 20.00    Types: Cigarettes  . Smokeless tobacco: Former Systems developer  . Tobacco comment: Quit smoking x 2 years    07/2015   SOMETIIMES i USE VAPOR   Substance Use Topics  . Alcohol use: Yes    Comment: rarely  . Drug use: No     Allergies   Patient has no known allergies.   Review of Systems Review of Systems  Cardiovascular: Negative for chest pain.  Gastrointestinal: Positive for nausea.  Neurological: Positive for dizziness.  All other systems reviewed and are negative.    Physical Exam Updated Vital Signs BP 127/76   Pulse (!) 49   Temp 97.7 F (36.5 C) (Oral)   Resp 16   Ht 5\' 6"  (1.676 m)   Wt 99.8 kg   SpO2 97%   BMI 35.51 kg/m   Physical Exam Vitals signs and nursing note reviewed.  Constitutional:      General: He is not in acute distress.    Appearance: Normal appearance. He is well-developed.  HENT:     Head: Normocephalic and atraumatic.     Right Ear: Hearing normal.     Left Ear: Hearing normal.     Nose: Nose normal.  Eyes:     Conjunctiva/sclera: Conjunctivae normal.     Pupils: Pupils are equal, round, and reactive to light.  Neck:     Musculoskeletal: Normal range of motion and neck supple.  Cardiovascular:     Rate and Rhythm: Regular rhythm.     Heart sounds: S1 normal and S2 normal. No murmur. No friction rub. No gallop.   Pulmonary:     Effort: Pulmonary effort is normal. No respiratory distress.     Breath sounds: Normal breath sounds.  Chest:     Chest wall: No tenderness.  Abdominal:     General: Bowel sounds are normal.     Palpations: Abdomen is soft.     Tenderness: There is no abdominal tenderness. There is no guarding or rebound. Negative signs include Murphy's sign and McBurney's sign.     Hernia: No hernia is present.  Musculoskeletal: Normal range of motion.  Skin:     General: Skin is warm and dry.     Findings: No rash.  Neurological:     Mental Status: He is alert and oriented to person, place, and time.     GCS: GCS eye subscore is 4. GCS verbal subscore is 5. GCS motor subscore is 6.     Cranial Nerves: No cranial nerve deficit.     Sensory: No sensory deficit.  Coordination: Coordination normal.  Psychiatric:        Speech: Speech normal.        Behavior: Behavior normal.        Thought Content: Thought content normal.      ED Treatments / Results  Labs (all labs ordered are listed, but only abnormal results are displayed) Labs Reviewed  BASIC METABOLIC PANEL - Abnormal; Notable for the following components:      Result Value   Potassium 3.2 (*)    Glucose, Bld 106 (*)    All other components within normal limits  CBC    EKG EKG Interpretation  Date/Time:  Sunday April 27 2019 03:26:57 EDT Ventricular Rate:  53 PR Interval:    QRS Duration: 92 QT Interval:  432 QTC Calculation: 406 R Axis:   15 Text Interpretation:  Sinus rhythm Ventricular premature complex Borderline T wave abnormalities No significant change since last tracing Confirmed by Marios Gaiser J ([id number]) on 04/27/2019 3:49:32 AM   Radiology No results found.  Procedures Procedures (including critical care time)  Medications Ordered in ED Medications  cloNIDine (CATAPRES) tablet 0.2 mg (0.2 mg Oral Given 04/27/19 0321)     Initial Impression / Assessment and Plan / ED Course  I have reviewed the triage vital signs and the nursing notes.  Pertinent labs & imaging results that were available during my care of the patient were reviewed by me and considered in my medical decision making (see chart for details).        Patient presents to the emergency department for evaluation of elevated blood pressure.  Patient has symptoms of dizziness and mild nausea when his blood pressure goes up.  He then checks it and it has been running consistently  elevated.  Patient was recently hospitalized for atrial fibrillation with rapid ventricular response.  He is in sinus rhythm today.  When he was discharged he had his metoprolol dose increased and had Norvasc added.  Hydrochlorothiazide was discontinued because of hypokalemia.  Patient is bradycardic from the Lopressor, no more room to increase beta-blocker.  We will therefore increase Norvasc to 10 mg daily and have a repeat check in 1 week with his primary doctor.  He is still hypokalemic, given a 30-day supply of potassium 20 mEq.  Final Clinical Impressions(s) / ED Diagnoses   Final diagnoses:  Essential hypertension  Hypokalemia    ED Discharge Orders         Ordered    potassium chloride SA (KLOR-CON) 20 MEQ tablet  Daily     04/27/19 0506    amLODipine (NORVASC) 10 MG tablet  Daily     10 /04/20 0506           [name], MD 04/27/19 [id number]

## 2019-04-27 NOTE — Discharge Instructions (Addendum)
Take 2 of the Norvasc (amlodipine) tablets a day instead of 1 until they run out, then use the new prescription for a total of 10 mg a day.  You were given clonidine 0.2 mg here in the ER today and had very good response with your blood pressure.  Your doctor may want to give you a prescription for these to be used as needed for elevated blood pressure if blood pressure remains difficult to control.

## 2019-04-27 NOTE — ED Triage Notes (Signed)
Pt c/o elevated blood pressure, reports that his blood pressure and been "going up and down" since yesterday, when blood pressure is elevated pt reports that he feels nauseous and lightheaded,

## 2019-04-29 ENCOUNTER — Other Ambulatory Visit: Payer: Self-pay

## 2019-04-29 ENCOUNTER — Encounter: Payer: Self-pay | Admitting: Cardiovascular Disease

## 2019-04-29 ENCOUNTER — Ambulatory Visit: Payer: BC Managed Care – PPO | Admitting: Cardiovascular Disease

## 2019-04-29 ENCOUNTER — Ambulatory Visit (HOSPITAL_COMMUNITY)
Admission: RE | Admit: 2019-04-29 | Discharge: 2019-04-29 | Disposition: A | Discharge status: Home / Self Care | Payer: BC Managed Care – PPO | Source: Ambulatory Visit | Attending: Cardiovascular Disease | Admitting: Cardiovascular Disease

## 2019-04-29 VITALS — BP 160/84 | HR 66 | Temp 97.1°F | Ht 66.0 in | Wt 219.0 lb

## 2019-04-29 DIAGNOSIS — K8020 Calculus of gallbladder without cholecystitis without obstruction: Secondary | ICD-10-CM | POA: Diagnosis not present

## 2019-04-29 DIAGNOSIS — R109 Unspecified abdominal pain: Secondary | ICD-10-CM

## 2019-04-29 DIAGNOSIS — R079 Chest pain, unspecified: Secondary | ICD-10-CM

## 2019-04-29 NOTE — Patient Instructions (Signed)
Medication Instructions:  Your physician recommends that you continue on your current medications as directed. Please refer to the Current Medication list given to you today.   Labwork: NONE  Testing/Procedures: Your physician has requested that you have en exercise stress myoview. For further information please visit [email or web address]. Please follow instruction sheet, as given.  CT ABDOMEN   Follow-Up: Your physician recommends that you schedule a follow-up appointment in: 6-8 WEEKS    Any Other Special Instructions Will Be Listed Below (If Applicable).     If you need a refill on your cardiac medications before your next appointment, please call your pharmacy.

## 2019-05-06 ENCOUNTER — Telehealth: Payer: Self-pay | Admitting: Cardiovascular Disease

## 2019-05-06 NOTE — Telephone Encounter (Signed)
Pt states that he is having some continuous dizziness. His blood pressure has been running 140/80 and heart rates in the mid 60's. He is not sure if his dizziness is coming from medication, but he is taking all medications as prescribed. He is also having a lot of headaches. He is wondering if his dizziness is coming form his heart arrhythmia. Please advise.

## 2019-05-06 NOTE — Telephone Encounter (Signed)
Pt made aware, voiced understanding. 

## 2019-05-06 NOTE — Telephone Encounter (Signed)
Patient states he is still having problems w/ dizziness. Please advise. / tg

## 2019-05-06 NOTE — Telephone Encounter (Signed)
No his dizziness is chronic and not from his heart f/u neuro/ primary

## 2019-05-07 ENCOUNTER — Encounter (HOSPITAL_COMMUNITY): Payer: BC Managed Care – PPO

## 2019-05-15 ENCOUNTER — Telehealth: Payer: Self-pay

## 2019-05-15 NOTE — Telephone Encounter (Signed)
Pt called in stating that he has been SOB over the past few days. He went outside earlier today to get his trash can and almost couldn't make it back in the house. He stated that he is dizzy headed and weak. He has taken his blood pressure, 128/58 and is unsure what his pulse rate is. He is not having chest pain at this time, but is having a "weird feeling" in his chest. He stated he has been having some fluttering off and on. He has taken all medications as prescribed. He states he just does not feel right and would like to be seen. I informed of our next office visit, but he stated he didn't want to wait that long. I advised him he could go to to the ED or urgent care to be evaluated.

## 2019-05-19 ENCOUNTER — Ambulatory Visit (INDEPENDENT_AMBULATORY_CARE_PROVIDER_SITE_OTHER): Payer: BC Managed Care – PPO | Admitting: Otolaryngology

## 2019-05-20 ENCOUNTER — Encounter (HOSPITAL_COMMUNITY): Payer: BC Managed Care – PPO

## 2019-05-20 ENCOUNTER — Encounter (HOSPITAL_COMMUNITY): Admission: RE | Admit: 2019-05-20 | Payer: BC Managed Care – PPO | Source: Ambulatory Visit

## 2019-05-22 ENCOUNTER — Telehealth: Payer: Self-pay | Admitting: Internal Medicine

## 2019-05-22 ENCOUNTER — Ambulatory Visit (INDEPENDENT_AMBULATORY_CARE_PROVIDER_SITE_OTHER): Payer: BC Managed Care – PPO | Admitting: Otolaryngology

## 2019-05-22 ENCOUNTER — Encounter: Payer: Self-pay | Admitting: Internal Medicine

## 2019-05-22 ENCOUNTER — Other Ambulatory Visit: Payer: Self-pay

## 2019-05-22 ENCOUNTER — Ambulatory Visit: Payer: BC Managed Care – PPO | Admitting: Gastroenterology

## 2019-05-22 DIAGNOSIS — R519 Headache, unspecified: Secondary | ICD-10-CM | POA: Diagnosis not present

## 2019-05-22 DIAGNOSIS — R42 Dizziness and giddiness: Secondary | ICD-10-CM

## 2019-05-22 NOTE — Telephone Encounter (Signed)
PATIENT WAS A NO SHOW AND LETTER SENT  °

## 2019-05-30 ENCOUNTER — Other Ambulatory Visit: Payer: Self-pay

## 2019-05-30 ENCOUNTER — Encounter: Payer: Self-pay | Admitting: Gastroenterology

## 2019-05-30 ENCOUNTER — Ambulatory Visit: Payer: BC Managed Care – PPO | Admitting: Gastroenterology

## 2019-05-30 VITALS — BP 128/75 | HR 58 | Temp 96.5°F | Ht 66.0 in | Wt 215.2 lb

## 2019-05-30 DIAGNOSIS — K625 Hemorrhage of anus and rectum: Secondary | ICD-10-CM | POA: Diagnosis not present

## 2019-05-30 DIAGNOSIS — K5900 Constipation, unspecified: Secondary | ICD-10-CM

## 2019-05-30 DIAGNOSIS — R110 Nausea: Secondary | ICD-10-CM | POA: Insufficient documentation

## 2019-05-30 DIAGNOSIS — R112 Nausea with vomiting, unspecified: Secondary | ICD-10-CM | POA: Insufficient documentation

## 2019-05-30 MED ORDER — PANTOPRAZOLE SODIUM 40 MG PO TBEC: 40.0000 mg | DELAYED_RELEASE_TABLET | Freq: Every day | ORAL | 3 refills | Status: DC

## 2019-05-30 NOTE — Progress Notes (Signed)
Referring Provider: Jake Samples, PA* Primary Care Physician:  Jake Samples, PA-C Primary GI: Dr. Gala Romney   Chief Complaint  Patient presents with  . Abdominal Pain    states he has gallstones  . Nausea  . Rectal Bleeding    sees small amt on tissue at times    HPI:   Steven [name] is a 68 y.o. male presenting today with a history of rectal bleeding in the past due to radiation proctitis, last colonoscopy in 2014. Recently diagnosed with paroxysmal atrial flutter/afib Aug 2020. Started on Xarelto. At last visit was dealing with recurrent bleeding in setting of constipation but wanted to hold off on colonoscopy. Presenting today with abdominal pain and nausea. CT abd without contrast Oct 2020 in interim from last visit with cholelithiasis, no associated inflammatory changes. Ordered by cardiology due to pain on exam at office visit. Gallstones also noted on prior imaging in 2017.   Given samples at visit in Sept 2020 for Linzess 72 mcg. Softened stool up. Handful of stool. Recommended Benefiber as well. Soft small balls for BMs. Denies abdominal pain today. When questioned about abdominal pain, states he doesn't really have any; however, states every now and then will have sharp LLQ discomfort, left lateral discomfort. Sometimes LUQ discomfort, vague reports, "not unbearable". Nauseated at night when relaxing, mostly while in the bed. After laying down for an hour or two will start having some nausea. Not waiting 2-3 hours after eating to lay down. Eats at 5 then lays down at 6pm. Occasional indigestion. States seems like may be coming from gallstones. Fixated on gallstones. No postprandial pain. Denies significant reflux during the day. Sometimes feels like he needs to belch. Prescibed Xifaxan by PCP for IBS. Patient without diarrhea and predominantly constipation. Taking Bentyl at night. Concerned about gallbladder.    Had tea that his daughter mixed up yesterday and had loose  stools. Rectum was burning. Small amount of blood on tissue but improved. Used Anusol prn. Rectal bleeding improved from prior visit. Declining colonoscopy right now.   Very concerned that he will have a gallbladder attack.   Difficult historian.  Past Medical History:  Diagnosis Date  . Arthritis   . Atrial fibrillation ([place])   . Atrial flutter ([place])   . Cancer [facility])    prostate s/p radiation Mar 2013  . Chest pain   . Fracture 08/17/2015   MULTIPLE RIB FRACTURES     FROM FALL   . History of radiation therapy 08/22/11-10/13/11   prostate  . Hyperlipemia   . Hypertension     Past Surgical History:  Procedure Laterality Date  . 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 Normal appearing colon  . [place]  . RADIOACTIVE SEED IMPLANT     Prostate    Current Outpatient Medications  Medication Sig Dispense Refill  . amLODipine (NORVASC) 10 MG tablet Take 1 tablet (10 mg total) by mouth daily. 30 tablet 6  . atorvastatin (LIPITOR) 20 MG tablet Take 1 tablet (20 mg total) by mouth every Tuesday, Thursday, Saturday, and Sunday. 20 tablet 5  . Cholecalciferol (VITAMIN D3) 125 MCG (5000 UT) CAPS Take 1 capsule by mouth daily.    . Cyanocobalamin (B-12 PO) Take 1 tablet by mouth daily.    . diazepam (VALIUM) 5 MG tablet Take 5 mg by mouth daily as needed.    . dicyclomine (BENTYL)  10 MG capsule Take 1 capsule by mouth 4 (four) times daily as needed.    . hydrocortisone (ANUSOL-HC) 2.5 % rectal cream Place 1 application rectally 2 (two) times daily. Use up to 10 days at a time. (Patient taking differently: Place 1 application rectally 2 (two) times daily as needed for hemorrhoids or anal itching. Use up to 10 days at a time.) 30 g 2  . losartan (COZAAR) 100 MG tablet Take 1 tablet (100 mg total) by mouth daily. 30 tablet 4  . metoprolol tartrate (LOPRESSOR) 25 MG tablet Take 1  tablet (25 mg total) by mouth 2 (two) times daily. 60 tablet 2  . Omega-3 Fatty Acids (FISH OIL) 1000 MG CAPS Take 1 capsule by mouth daily.    . potassium chloride SA (KLOR-CON) 20 MEQ tablet Take 1 tablet (20 mEq total) by mouth daily. 30 tablet 0  . rivaroxaban (XARELTO) 20 MG TABS tablet Take 1 tablet (20 mg total) by mouth daily with supper. 30 tablet 1  . vitamin C (ASCORBIC ACID) 500 MG tablet Take 500 mg by mouth daily.    Marland Kitchen VITAMIN E PO Take 1 tablet by mouth daily.    Marland Kitchen XIFAXAN 550 MG TABS tablet Take 1 tablet by mouth 3 (three) times daily.    . pantoprazole (PROTONIX) 40 MG tablet Take 1 tablet (40 mg total) by mouth daily. 30 minutes before breakfast 30 tablet 3   No current facility-administered medications for this visit.     Allergies as of 05/30/2019  . (No Known Allergies)    Family History  Problem Relation Age of Onset  . Aneurysm Mother   . Prostate cancer Father   . Colon cancer Neg Hx   . Colon polyps Neg Hx     Social History   Socioeconomic History  . Marital status: Single    Spouse name: Not on file  . Number of children: Not on file  . Years of education: Not on file  . Highest education level: Not on file  Occupational History  . Occupation: Custodian     Employer: [employer]  Social Needs  . Financial resource strain: Not on file  . Food insecurity    Worry: Not on file    Inability: Not on file  . Transportation needs    Medical: Not on file    Non-medical: Not on file  Tobacco Use  . Smoking status: Former Smoker    Packs/day: 1.00    Years: 20.00    Pack years: 20.00    Types: Cigarettes  . Smokeless tobacco: Former Systems developer  . Tobacco comment: Quit smoking x 2 years    07/2015   SOMETIIMES i USE VAPOR   Substance and Sexual Activity  . Alcohol use: Not Currently    Comment: rarely  . Drug use: No  . Sexual activity: Not on file  Lifestyle  . Physical activity    Days per week: Not on file    Minutes per session: Not  on file  . Stress: Not on file  Relationships  . Social Herbalist on phone: Not on file    Gets together: Not on file    Attends religious service: Not on file    Active member of club or organization: Not on file    Attends meetings of clubs or organizations: Not on file    Relationship status: Not on file  Other Topics Concern  . Not on file  Social  History Narrative   Divorced    Review of Systems: Gen: Denies fever, chills, anorexia. Denies fatigue, weakness, weight loss.  CV: Denies chest pain, palpitations, syncope, peripheral edema, and claudication. Resp: Denies dyspnea at rest, cough, wheezing, coughing up blood, and pleurisy. GI: Denies vomiting blood, jaundice, and fecal incontinence.   Denies dysphagia or odynophagia. Derm: Denies rash, itching, dry skin Psych: Denies depression, anxiety, memory loss, confusion. No homicidal or suicidal ideation.  Heme: Denies bruising, bleeding, and enlarged lymph nodes.  Physical Exam: BP 128/75   Pulse (!) 58   Temp (!) 96.5 F (35.8 C) (Temporal)   Ht 5\' 6"  (1.676 m)   Wt 215 lb 3.2 oz (97.6 kg)   BMI 34.73 kg/m  General:   Alert and oriented. No distress noted. Pleasant and cooperative.  Head:  Normocephalic and atraumatic. Eyes:  Conjuctiva clear without scleral icterus. Mouth:  Oral mucosa pink and moist. Good dentition. No lesions. Abdomen:  +BS, soft, non-tender and non-distended. No rebound or guarding. No HSM or masses noted. Msk:  Symmetrical without gross deformities. Normal posture. Extremities:  Without edema. Neurologic:  Alert and  oriented x4 Psych:  Alert and cooperative. Normal mood and affect.  ASSESSMENT: CRIAG FURNISH is a 68 y.o. male presenting today with multiple vague complaints including fleeting lower abdominal discomfort, nausea, indigestion, and constipation. He is fixated on his gallbladder and concerned his symptoms are stemming from cholelithiasis; recent CT with cholelithiasis but  no acute findings. Gallstones also noted several years ago in imaging. His symptoms are not typical of biliary etiology, as he denies postprandial pain, RUQ Pain, N/V, etc. I feel his symptoms are multifactorial with underlying anxiety and fixation on biliary etiology. We discussed at length the following:  Constipation: avoid Bentyl as this can contribute. Anticipate abdominal fleeting discomfort will improve with appropriate bowel regimen. Currently with unproductive stool, some improvement with Linzess 72 mcg but did not continue this. Will increase to Linzess 145, provide samples, and contact him next week as I am concerned that he will not keep [place] updated on how he is doing after samples.   Nausea: with GERD, precipitated after laying down s/p eating a meal. Discussed dietary/behavior modification. Start Protonix once daily. No alarm signs/symptoms. Handout provided.   Rectal bleeding: improved. Rectal discomfort improved. Continues to decline colonoscopy; last was in 2014. Known history of radiation proctitis as possible culprit +/- hemorrhoids.    PLAN:   Start Linzess 145 mcg samples. Will follow-up with patient in next week or so.   Protonix 40 mg once each morning sent to pharmacy  Benefiber daily  GERD diet/behavior modification, avoid laying down after eating  Avoid Bentyl  Recommend colonoscopy when patient is able  Return in 3 months   Annitta Needs, PhD, [facility] Gastroenterology

## 2019-05-30 NOTE — Patient Instructions (Addendum)
I feel you are dealing with constipation and some reflux that is causing your symptoms. I don't feel that gallstones are symptomatic, and it is likely just an incidental finding. You actually had gallstones in 2017 as well, so I don't feel this is the concern.  I do feel we need to get your bowel habits better. Try the Benefiber 2 teaspoons daily and increase if needed. I have given you Linzess samples (the middle dose), to take once each morning, 30 minutes before breakfast on an empty stomach. Please touch base with [place] next week as we may need to adjust this.  I have also sent in Protonix to take once each morning, 30 minutes before breakfast. This is to help with any reflux symptoms, too much acid, etc. It is important to not eat heavy meals in the evening and to avoid laying down for at least 2-3 hours after eating. If you lay down right after eating or shortly after, you will probably have that nausea.   Dicyclomine (Bentyl): this was given by your primary care for pain. This should just be used sparingly, as it can cause constipation, dizziness, drowsiness, dry mouth. I would avoid it for now.  Still recommend a colonoscopy when you are able to proceed due to rectal bleeding. Call us if you would like to pursue this soon!  We will see you in 3 months!  I enjoyed seeing you again today! As you know, I value our relationship and want to provide genuine, compassionate, and quality care. I welcome your feedback. If you receive a survey regarding your visit,  I greatly appreciate you taking time to fill this out. See you next time!  Annitta Needs, PhD, ANP-BC [facility] Gastroenterology   Food Choices for Gastroesophageal Reflux Disease, Adult When you have gastroesophageal reflux disease (GERD), the foods you eat and your eating habits are very important. Choosing the right foods can help ease your discomfort. Think about working with a nutrition specialist (dietitian) to help you make good  choices. What are tips for following this plan?  Meals  Choose healthy foods that are low in fat, such as fruits, vegetables, whole grains, low-fat dairy products, and lean meat, fish, and poultry.  Eat small meals often instead of 3 large meals a day. Eat your meals slowly, and in a place where you are relaxed. Avoid bending over or lying down until 2-3 hours after eating.  Avoid eating meals 2-3 hours before bed.  Avoid drinking a lot of liquid with meals.  Cook foods using methods other than frying. Bake, grill, or broil food instead.  Avoid or limit: ? Chocolate. ? Peppermint or spearmint. ? Alcohol. ? Pepper. ? Black and decaffeinated coffee. ? Black and decaffeinated tea. ? Bubbly (carbonated) soft drinks. ? Caffeinated energy drinks and soft drinks.  Limit high-fat foods such as: ? Fatty meat or fried foods. ? Whole milk, cream, butter, or ice cream. ? Nuts and nut butters. ? Pastries, donuts, and sweets made with butter or shortening.  Avoid foods that cause symptoms. These foods may be different for everyone. Common foods that cause symptoms include: ? Tomatoes. ? Oranges, lemons, and limes. ? Peppers. ? Spicy food. ? Onions and garlic. ? Vinegar. Lifestyle  Maintain a healthy weight. Ask your doctor what weight is healthy for you. If you need to lose weight, work with your doctor to do so safely.  Exercise for at least 30 minutes for 5 or more days each week, or as told  by your doctor.  Wear loose-fitting clothes.  Do not smoke. If you need help quitting, ask your doctor.  Sleep with the head of your bed higher than your feet. Use a wedge under the mattress or blocks under the bed frame to raise the head of the bed. Summary  When you have gastroesophageal reflux disease (GERD), food and lifestyle choices are very important in easing your symptoms.  Eat small meals often instead of 3 large meals a day. Eat your meals slowly, and in a place where you are  relaxed.  Limit high-fat foods such as fatty meat or fried foods.  Avoid bending over or lying down until 2-3 hours after eating.  Avoid peppermint and spearmint, caffeine, alcohol, and chocolate. This information is not intended to replace advice given to you by your health care provider. Make sure you discuss any questions you have with your health care provider. Document Released: 01/09/2012 Document Revised: 10/31/2018 Document Reviewed: 08/15/2016 Elsevier Patient Education  2020 [employer].

## 2019-05-30 NOTE — Progress Notes (Signed)
cc'ed to pcp °

## 2019-06-05 ENCOUNTER — Ambulatory Visit (INDEPENDENT_AMBULATORY_CARE_PROVIDER_SITE_OTHER): Payer: BC Managed Care – PPO | Admitting: Otolaryngology

## 2019-06-09 ENCOUNTER — Telehealth: Payer: Self-pay | Admitting: Internal Medicine

## 2019-06-09 NOTE — Telephone Encounter (Signed)
Spoke with pt. He is having some pain on the lt side of his rectum. Pain is sometimes sharp and his bowel movements are soft. Pain is on the left side even if he has diarrhea. Pt is taking Linzess 145mg  and may need to decrease to 72 mcg since he is taking fiber. Pt didn't get the Benefiber due to the cost but pt is using the metamucil he had at some. Pt noticed a little blood on the tissue when he wiped. Pt wants to know if there is a suppository that can help with the soreness. Pt is using the topical RX  Anusol cr as directed. Pt also states if he needs to continue this cr, he needs a refill.

## 2019-06-09 NOTE — Telephone Encounter (Signed)
PATIENT CALLED TO SAY THAT HE IS STILL HAVING PAIN WHEN GOING TO THE BATHROOM AND SOMETIMES THERE IS A LITTLE BLOOD,  WANTED TO KNOW IF HE NEEDED A SUPPOSITORY  [phone]

## 2019-06-10 ENCOUNTER — Telehealth: Payer: Self-pay | Admitting: Internal Medicine

## 2019-06-10 ENCOUNTER — Ambulatory Visit: Payer: BC Managed Care – PPO | Admitting: Student

## 2019-06-10 ENCOUNTER — Encounter: Payer: Self-pay | Admitting: Student

## 2019-06-10 ENCOUNTER — Other Ambulatory Visit: Payer: Self-pay

## 2019-06-10 VITALS — BP 137/72 | HR 58 | Temp 97.0°F | Ht 66.0 in | Wt 216.0 lb

## 2019-06-10 DIAGNOSIS — I4892 Unspecified atrial flutter: Secondary | ICD-10-CM

## 2019-06-10 DIAGNOSIS — E785 Hyperlipidemia, unspecified: Secondary | ICD-10-CM | POA: Diagnosis not present

## 2019-06-10 DIAGNOSIS — Z79899 Other long term (current) drug therapy: Secondary | ICD-10-CM

## 2019-06-10 DIAGNOSIS — I10 Essential (primary) hypertension: Secondary | ICD-10-CM | POA: Diagnosis not present

## 2019-06-10 DIAGNOSIS — R079 Chest pain, unspecified: Secondary | ICD-10-CM

## 2019-06-10 NOTE — Telephone Encounter (Signed)
Pt asked for the nurse to call him regarding his medications. [phone]

## 2019-06-10 NOTE — Telephone Encounter (Signed)
Pt is aware that a message was sent to AB yesterday. I will contact pt when we hear back from the provider.

## 2019-06-10 NOTE — Patient Instructions (Addendum)
Would try reducing Lopressor to 12.5mg  twice daily for 2-3 days to see if this helps with dizziness. If no improvement, go back to 25mg  twice daily.      *If you need a refill on your cardiac medications before your next appointment, please call your pharmacy*  Lab Work:  CBC now  If you have labs (blood work) drawn today and your tests are completely normal, you will receive your results only by: Marland Kitchen MyChart Message (if you have MyChart) OR . A paper copy in the mail If you have any lab test that is abnormal or we need to change your treatment, we will call you to review the results.  Testing/Procedures: None ordered today.call us if you wish to reschedule your test.  Follow-Up: At [facility], you and your health needs are our priority.  As part of our continuing mission to provide you with exceptional heart care, we have created designated Provider Care Teams.  These Care Teams include your primary Cardiologist (physician) and Advanced Practice Providers (APPs -  Physician Assistants and Nurse Practitioners) who all work together to provide you with the care you need, when you need it.  Your next appointment:     December 22 at 3 pm with Dr.Nishan      Thank you for choosing [facility] !

## 2019-06-10 NOTE — Progress Notes (Signed)
Cardiology Office Note    Date:  06/10/2019   ID:  Steven [name], DOB Jul 22, 1951, MRN [id number]  PCP:  Steven Samples, PA-C  Cardiologist: Steven Rouge, MD    Chief Complaint  Patient presents with  . Follow-up    6 week visit    History of Present Illness:    Steven [name] is a 68 y.o. male with past medical history of paroxysmal atrial fibrillation/flutter, HTN, and HLD who presents to the office today for 6-week follow-up.   He was last examined by Dr. Johnsie Cancel in 04/2019 following a recent admission for recurrent atrial fibrillation in the setting of hypokalemia. HCTZ had been discontinued at the time of discharge. He denied any recurrent palpitations but was experiencing multiple somatic complaints thought to be concerning for IBS. Was recommended to have a CT Abdomen to rule-out any acute findings along with a Treadmill Myoview to rule-out ischemia. Abdominal imaging showed cholelithiasis without associated inflammatory changes and no intrahepatic or extrahepatic ductal dilatation. Was also noted to have bilateral nonobstructing lower pole renal calculi measuring up to 83mm but no hydronephrosis. Exercise Myoview was scheduled twice in 04/2019 but never performed due to the patient canceling.   In talking with the patient today, he reports a variety of symptoms. He feels like his abdominal pain has improved but he has been having recurrent issues with hemorrhoids and is being followed by GI. Reports no improvement with OTC remedies. He is still having dizziness and was evaluated by ENT and given a list of exercises to perform to improve his balance. He has scheduled follow-up with Neurology later this week as well and says they plan to perform an EEG.   He denies any recurrent chest pain or dyspnea on exertion. He has been doing yard work without anginal symptoms. No recent orthopnea, PND, lower extremity edema or palpitations.    Past Medical History:  Diagnosis Date  .  Arthritis   . Atrial fibrillation ([place])   . Atrial flutter ([place])   . Cancer [facility])    prostate s/p radiation Mar 2013  . Chest pain   . Fracture 08/17/2015   MULTIPLE RIB FRACTURES     FROM FALL   . History of radiation therapy 08/22/11-10/13/11   prostate  . Hyperlipemia   . Hypertension     Past Surgical History:  Procedure Laterality Date  . 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 Normal appearing colon  . [place]  . RADIOACTIVE SEED IMPLANT     Prostate    Current Medications: Outpatient Medications Prior to Visit  Medication Sig Dispense Refill  . amLODipine (NORVASC) 10 MG tablet Take 1 tablet (10 mg total) by mouth daily. 30 tablet 6  . atorvastatin (LIPITOR) 20 MG tablet Take 1 tablet (20 mg total) by mouth every Tuesday, Thursday, Saturday, and Sunday. 20 tablet 5  . Cholecalciferol (VITAMIN D3) 125 MCG (5000 UT) CAPS Take 1 capsule by mouth daily.    . Cyanocobalamin (B-12 PO) Take 1 tablet by mouth daily.    . diazepam (VALIUM) 5 MG tablet Take 5 mg by mouth daily as needed.    . dicyclomine (BENTYL) 10 MG capsule Take 1 capsule by mouth 4 (four) times daily as needed.    . hydrocortisone (ANUSOL-HC) 2.5 % rectal cream Place 1 application rectally 2 (two) times daily. Use up to 10 days at a time. (Patient  taking differently: Place 1 application rectally 2 (two) times daily as needed for hemorrhoids or anal itching. Use up to 10 days at a time.) 30 g 2  . losartan (COZAAR) 100 MG tablet Take 1 tablet (100 mg total) by mouth daily. 30 tablet 4  . metoprolol tartrate (LOPRESSOR) 25 MG tablet Take 1 tablet (25 mg total) by mouth 2 (two) times daily. 60 tablet 2  . Omega-3 Fatty Acids (FISH OIL) 1000 MG CAPS Take 1 capsule by mouth daily.    . pantoprazole (PROTONIX) 40 MG tablet Take 1 tablet (40 mg total) by mouth daily. 30 minutes before breakfast 30 tablet 3  .  potassium chloride SA (KLOR-CON) 20 MEQ tablet Take 1 tablet (20 mEq total) by mouth daily. 30 tablet 0  . rivaroxaban (XARELTO) 20 MG TABS tablet Take 1 tablet (20 mg total) by mouth daily with supper. 30 tablet 1  . vitamin C (ASCORBIC ACID) 500 MG tablet Take 500 mg by mouth daily.    Marland Kitchen VITAMIN E PO Take 1 tablet by mouth daily.    Marland Kitchen XIFAXAN 550 MG TABS tablet Take 1 tablet by mouth 3 (three) times daily.     No facility-administered medications prior to visit.      Allergies:   Patient has no known allergies.   Social History   Socioeconomic History  . Marital status: Single    Spouse name: Not on file  . Number of children: Not on file  . Years of education: Not on file  . Highest education level: Not on file  Occupational History  . Occupation: Custodian     Employer: [employer]  Social Needs  . Financial resource strain: Not on file  . Food insecurity    Worry: Not on file    Inability: Not on file  . Transportation needs    Medical: Not on file    Non-medical: Not on file  Tobacco Use  . Smoking status: Former Smoker    Packs/day: 1.00    Years: 20.00    Pack years: 20.00    Types: Cigarettes  . Smokeless tobacco: Former Systems developer  . Tobacco comment: Quit smoking x 2 years    07/2015   SOMETIIMES i USE VAPOR   Substance and Sexual Activity  . Alcohol use: Not Currently    Comment: rarely  . Drug use: No  . Sexual activity: Not on file  Lifestyle  . Physical activity    Days per week: Not on file    Minutes per session: Not on file  . Stress: Not on file  Relationships  . Social Herbalist on phone: Not on file    Gets together: Not on file    Attends religious service: Not on file    Active member of club or organization: Not on file    Attends meetings of clubs or organizations: Not on file    Relationship status: Not on file  Other Topics Concern  . Not on file  Social History Narrative   Divorced     Family History:  The  patient's family history includes Aneurysm in his mother; Prostate cancer in his father.   Review of Systems:   Please see the history of present illness.     General:  No chills, fever, night sweats or weight changes.  Cardiovascular:  No chest pain, dyspnea on exertion, edema, orthopnea, palpitations, paroxysmal nocturnal dyspnea. Dermatological: No rash, lesions/masses Respiratory: No cough, dyspnea Urologic: No  hematuria, dysuria Abdominal:   No nausea, vomiting, diarrhea, melena, or hematemesis. Positive for BRBPR.  Neurologic:  No visual changes, wkns, changes in mental status. Positive for dizziness.    All other systems reviewed and are otherwise negative except as noted above.   Physical Exam:    VS:  BP 137/72   Pulse (!) 58   Temp (!) 97 F (36.1 C)   Ht 5\' 6"  (1.676 m)   Wt 216 lb (98 kg)   SpO2 96%   BMI 34.86 kg/m    General: Well developed, well nourished,male appearing in no acute distress. Head: Normocephalic, atraumatic, sclera non-icteric, no xanthomas, nares are without discharge.  Neck: No carotid bruits. JVD not elevated.  Lungs: Respirations regular and unlabored, without wheezes or rales.  Heart: Regular rate and rhythm. No S3 or S4.  No murmur, no rubs, or gallops appreciated. Abdomen: Soft, non-tender, non-distended with normoactive bowel sounds. No hepatomegaly. No rebound/guarding. No obvious abdominal masses. Msk:  Strength and tone appear normal for age. No joint deformities or effusions. Extremities: No clubbing or cyanosis. No lower extremity edema.  Distal pedal pulses are 2+ bilaterally. Neuro: Alert and oriented X 3. Moves all extremities spontaneously. No focal deficits noted. Psych:  Responds to questions appropriately with a normal affect. Skin: No rashes or lesions noted  Wt Readings from Last 3 Encounters:  06/10/19 216 lb (98 kg)  05/30/19 215 lb 3.2 oz (97.6 kg)  04/29/19 219 lb (99.3 kg)     Studies/Labs Reviewed:   EKG:  EKG  is not ordered today.    Recent Labs: 03/02/2019: ALT 20; TSH 0.843 04/17/2019: Magnesium 2.2 04/27/2019: BUN 11; Creatinine, Ser 0.76; Hemoglobin 14.1; Platelets 218; Potassium 3.2; Sodium 136   Lipid Panel    Component Value Date/Time   CHOL 142 03/02/2019 0329   TRIG 89 03/02/2019 0329   HDL 32 (L) 03/02/2019 0329   CHOLHDL 4.4 03/02/2019 0329   VLDL 18 03/02/2019 0329   LDLCALC 92 03/02/2019 0329    Additional studies/ records that were reviewed today include:   Echocardiogram: 02/2019 IMPRESSIONS    1. The left ventricle has normal systolic function with an ejection fraction of 60-65%. The cavity size was normal. Left ventricular diastolic parameters were normal.  2. The right ventricle has normal systolic function. The cavity was normal. There is no increase in right ventricular wall thickness.  3. Mild thickening of the mitral valve leaflet.  4. The aortic valve is tricuspid. Mild thickening of the aortic valve. Mild calcification of the aortic valve.  5. The aorta is normal in size and structure.   Event Monitor: 03/2019 NSR  No significant arrhythmia No PAF  Assessment:    1. Atrial flutter, paroxysmal ([place])   2. Medication management   3. Essential hypertension   4. Hyperlipidemia, unspecified hyperlipidemia type   5. Chest pain, unspecified type      Plan:   In order of problems listed above:  1. Paroxysmal Atrial Fibrillation/Flutter - recent event monitor demonstrated no recurrence but he was hospitalized at the end of 03/2019 for recurrent atrial fibrillation and spontaneously converted back to NSR. He continues to experience dizziness and I suspect this is not secondary to a cardiac etiology but given his HR in the 50's, I recommended he reduce his Lopressor from 25mg  BID to 12.5mg  BID for a few days to see if this helps with his symptoms. If no improvement, I recommended going back to his current dose.  - he  does report having active hemorrhoids with  associated hematochezia. Given the use of anticoagulation, will recheck CBC. Hgb was stable at 14.1 in 04/2019.  2. HTN - BP is at 137/72 during today's visit. Continue current regimen with Amlodipine and Losartan with dose adjustment of Lopressor as outlined above.   3. HLD - followed by PCP. Continue Atorvastatin at current dosing.   4. Dizziness/Chest Pain - he continues to have dizziness which is being worked-up by Neurology and ENT. He had previously been scheduled for a Myoview given his symptoms of dizziness and chest pain but canceled the study. Denies any recurrent chest pain in the interim. He wishes to call back to reschedule this within the next few weeks pending results of his Neurology evaluation.    The patient wishes to keep his scheduled follow-up visit with Dr. Johnsie Cancel for next month.     Medication Adjustments/Labs and Tests Ordered: Current medicines are reviewed at length with the patient today.  Concerns regarding medicines are outlined above.  Medication changes, Labs and Tests ordered today are listed in the Patient Instructions below. Patient Instructions  Would try reducing Lopressor to 12.5mg  twice daily for 2-3 days to see if this helps with dizziness. If no improvement, go back to 25mg  twice daily.      *If you need a refill on your cardiac medications before your next appointment, please call your pharmacy*  Lab Work:  CBC now  If you have labs (blood work) drawn today and your tests are completely normal, you will receive your results only by: Marland Kitchen MyChart Message (if you have MyChart) OR . A paper copy in the mail If you have any lab test that is abnormal or we need to change your treatment, we will call you to review the results.  Testing/Procedures: None ordered today.call us if you wish to reschedule your test.  Follow-Up: At [facility], you and your health needs are our priority.  As part of our continuing mission to provide you with  exceptional heart care, we have created designated Provider Care Teams.  These Care Teams include your primary Cardiologist (physician) and Advanced Practice Providers (APPs -  Physician Assistants and Nurse Practitioners) who all work together to provide you with the care you need, when you need it.  Your next appointment:    December 22 at 3 pm with Dr.Nishan   Thank you for choosing [facility] !         Signed, Erma Heritage, PA-C  06/10/2019 7:01 PM    [place] [address] [place], [place] [address] Phone: [phone] Fax: [phone]

## 2019-06-11 ENCOUNTER — Telehealth: Payer: Self-pay | Admitting: Internal Medicine

## 2019-06-11 NOTE — Telephone Encounter (Signed)
Pt was calling again to see what the provider recommends about his hemorrhoid and his medication. He said it was painful and he needed some kind of medicine. I told him the nurse was aware and would call him as soon as she heard from AB.

## 2019-06-11 NOTE — Telephone Encounter (Signed)
Pt does have sharp pains when having a bowel movement and passing gas. Pt wants to try which ever medication will work well for him. Pt didn't like the cost of of the CP cream but is willing to get it if AB feels it will work best.

## 2019-06-11 NOTE — Telephone Encounter (Signed)
I'm sorry for the delay: the note had become "unhilighted" in my box.   History of radiation proctitis. I had recommend colonoscopy, but he is declining currently. Could be dealing with an occult anal fissure in light of his symptoms. Please ask if he would be willing to try the [place] hemorrhoid cream compounded with lidocaine and nitroglycerin. This is run through insurance but may stil be about 40-50$ at most out of pocket. If not, will consider [place]asa suppositories.

## 2019-06-11 NOTE — Telephone Encounter (Signed)
I will call into [place]. They will call him for insurance info and discuss costs at that time. He can decide if he wants to pursue or not at that point. Please keep [place] updated.   I called in the cream. Use 4 times a day. Refill provided. Use gloves while applying and wash hands thereafter. May experience dizziness or headache: monitor for this. They will call him to go over insurance. Don't have him pick up till they let him know it's ready. Thanks!

## 2019-06-11 NOTE — Telephone Encounter (Signed)
Please see phone note. Any knife-like pain with BMs?

## 2019-06-11 NOTE — Telephone Encounter (Signed)
Noted, pt is aware 

## 2019-06-11 NOTE — Telephone Encounter (Signed)
See documentation in other phone note.  

## 2019-06-11 NOTE — Telephone Encounter (Signed)
Routing to AB. See previous notes.

## 2019-06-12 ENCOUNTER — Ambulatory Visit (INDEPENDENT_AMBULATORY_CARE_PROVIDER_SITE_OTHER): Payer: BC Managed Care – PPO | Admitting: Otolaryngology

## 2019-06-13 ENCOUNTER — Ambulatory Visit (INDEPENDENT_AMBULATORY_CARE_PROVIDER_SITE_OTHER): Payer: BC Managed Care – PPO | Admitting: Urology

## 2019-06-13 DIAGNOSIS — R9721 Rising PSA following treatment for malignant neoplasm of prostate: Secondary | ICD-10-CM

## 2019-06-13 DIAGNOSIS — Z8546 Personal history of malignant neoplasm of prostate: Secondary | ICD-10-CM

## 2019-06-16 ENCOUNTER — Telehealth: Payer: Self-pay | Admitting: Cardiovascular Disease

## 2019-06-16 DIAGNOSIS — I4892 Unspecified atrial flutter: Secondary | ICD-10-CM

## 2019-06-16 DIAGNOSIS — R0600 Dyspnea, unspecified: Secondary | ICD-10-CM

## 2019-06-16 NOTE — Telephone Encounter (Signed)
     Did his symptoms improve when we reduced his Lopressor from 25mg  BID to 12.5mg  BID at his last office visit? If BP has been stable, I would not think of his other medications contributing to this.   He has already undergone workup by ENT and Neurology. Yes, it was recommended by Dr. Johnsie Cancel for him to have a Myoview and this can be scheduled if he now wishes to proceed. If having lightheadedness, would do a Lexiscan.   Signed, Erma Heritage, PA-C 06/16/2019, 12:52 PM Pager: [phone]

## 2019-06-16 NOTE — Telephone Encounter (Signed)
Pt states that he did not notice a change in symptome decreasing his lopressor to 12.5 mg bid.He states that he will have his stress test. Will forward to front office to schedule.

## 2019-06-16 NOTE — Telephone Encounter (Signed)
Will forward to Brittany Strader, PA. To advise.  

## 2019-06-16 NOTE — Telephone Encounter (Signed)
Patient adds to take a look at his medicines and see if one of the meds he is on is causing his symptoms. Would like [place] or [place] to respond.

## 2019-06-23 ENCOUNTER — Other Ambulatory Visit (HOSPITAL_COMMUNITY)
Admission: RE | Admit: 2019-06-23 | Discharge: 2019-06-23 | Disposition: A | Discharge status: Home / Self Care | Payer: BC Managed Care – PPO | Source: Ambulatory Visit | Attending: Student | Admitting: Student

## 2019-06-23 DIAGNOSIS — Z79899 Other long term (current) drug therapy: Secondary | ICD-10-CM | POA: Insufficient documentation

## 2019-06-23 LAB — CBC
HCT: 44.1 % (ref 39.0–52.0)
Hemoglobin: 14.4 g/dL (ref 13.0–17.0)
MCH: 29.4 pg (ref 26.0–34.0)
MCHC: 32.7 g/dL (ref 30.0–36.0)
MCV: 90 fL (ref 80.0–100.0)
Platelets: 225 10*3/uL (ref 150–400)
RBC: 4.9 MIL/uL (ref 4.22–5.81)
RDW: 13 % (ref 11.5–15.5)
WBC: 4.8 10*3/uL (ref 4.0–10.5)
nRBC: 0 % (ref 0.0–0.2)

## 2019-06-25 ENCOUNTER — Encounter (HOSPITAL_COMMUNITY): Payer: Self-pay

## 2019-06-25 ENCOUNTER — Other Ambulatory Visit: Payer: Self-pay

## 2019-06-25 ENCOUNTER — Encounter (HOSPITAL_BASED_OUTPATIENT_CLINIC_OR_DEPARTMENT_OTHER)
Admission: RE | Admit: 2019-06-25 | Discharge: 2019-06-25 | Disposition: A | Discharge status: Home / Self Care | Payer: BC Managed Care – PPO | Source: Ambulatory Visit | Attending: Student | Admitting: Student

## 2019-06-25 ENCOUNTER — Encounter (HOSPITAL_COMMUNITY)
Admission: RE | Admit: 2019-06-25 | Discharge: 2019-06-25 | Disposition: A | Discharge status: Home / Self Care | Payer: BC Managed Care – PPO | Source: Ambulatory Visit | Attending: Student | Admitting: Student

## 2019-06-25 DIAGNOSIS — I4892 Unspecified atrial flutter: Secondary | ICD-10-CM | POA: Diagnosis not present

## 2019-06-25 DIAGNOSIS — R0600 Dyspnea, unspecified: Secondary | ICD-10-CM | POA: Insufficient documentation

## 2019-06-25 LAB — NM MYOCAR MULTI W/SPECT W/WALL MOTION / EF
LV dias vol: 99 mL (ref 62–150)
LV sys vol: 37 mL
Peak HR: 90 {beats}/min
RATE: 0.29
Rest HR: 53 {beats}/min
SDS: 3
SRS: 1
SSS: 4
TID: 0.99

## 2019-06-25 MED ORDER — TECHNETIUM TC 99M TETROFOSMIN IV KIT
30.0000 | PACK | Freq: Once | INTRAVENOUS | Status: AC | PRN
  Administered 2019-06-25: 31.6 via INTRAVENOUS

## 2019-06-25 MED ORDER — TECHNETIUM TC 99M TETROFOSMIN IV KIT
10.0000 | PACK | Freq: Once | INTRAVENOUS | Status: AC | PRN
  Administered 2019-06-25: 11 via INTRAVENOUS

## 2019-06-25 MED ORDER — REGADENOSON 0.4 MG/5ML IV SOLN
INTRAVENOUS | Status: AC
  Administered 2019-06-25: 0.4 mg via INTRAVENOUS
  Filled 2019-06-25: qty 5

## 2019-06-25 MED ORDER — SODIUM CHLORIDE FLUSH 0.9 % IV SOLN
INTRAVENOUS | Status: AC
  Administered 2019-06-25: 10 mL via INTRAVENOUS
  Filled 2019-06-25: qty 10

## 2019-06-26 ENCOUNTER — Ambulatory Visit (INDEPENDENT_AMBULATORY_CARE_PROVIDER_SITE_OTHER): Payer: BC Managed Care – PPO | Admitting: Otolaryngology

## 2019-06-30 ENCOUNTER — Ambulatory Visit (INDEPENDENT_AMBULATORY_CARE_PROVIDER_SITE_OTHER): Payer: BC Managed Care – PPO | Admitting: Otolaryngology

## 2019-06-30 ENCOUNTER — Other Ambulatory Visit: Payer: Self-pay

## 2019-07-09 NOTE — Progress Notes (Signed)
Cardiology Office Note    Date:  07/15/2019   ID:  Steven [name], DOB 1951/06/29, MRN [id number]  PCP:  Steven Samples, PA-C  Cardiologist: Jenkins Rouge, MD    No chief complaint on file.   History of Present Illness:    Steven [name] is a 68 y.o. male with past medical history of paroxysmal atrial fibrillation/flutter, HTN, and HLD F/U Cardiac monitor done 04/09/19 for dizziness showed no long pauses and no more PAF. Myovue done 06/25/19 with no  Ischemia EF 62% Echo 03/02/19 EF 60-65% no valve disease Lopressor was decreased last visit by PA but no change In dizzy symptoms.   In talking with the patient today, he reports a variety of symptoms. He feels like his abdominal pain has improved but he has been having recurrent issues with hemorrhoids and is being followed by GI. Reports no improvement with OTC remedies. He is still having dizziness and was evaluated by ENT and given a list of exercises to perform to improve his balance. He has scheduled follow-up with Neurology [facility] and says they plan to perform an EEG. \  He still feels swimmy headed after waking in am He inquired about Watchman and I told him since he has no contraindication to anticoagulation He would not necessarily be a candidate and that the procedure has risks including death  Past Medical History:  Diagnosis Date  . Arthritis   . Atrial fibrillation ([place])   . Atrial flutter ([place])   . Cancer [facility])    prostate s/p radiation Mar 2013  . Chest pain   . Fracture 08/17/2015   MULTIPLE RIB FRACTURES     FROM FALL   . History of radiation therapy 08/22/11-10/13/11   prostate  . Hyperlipemia   . Hypertension     Past Surgical History:  Procedure Laterality Date  . 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 Normal appearing colon  . [place]  . RADIOACTIVE SEED IMPLANT     Prostate     Current Medications: Outpatient Medications Prior to Visit  Medication Sig Dispense Refill  . amLODipine (NORVASC) 10 MG tablet Take 1 tablet (10 mg total) by mouth daily. 30 tablet 6  . atorvastatin (LIPITOR) 20 MG tablet Take 1 tablet (20 mg total) by mouth every Tuesday, Thursday, Saturday, and Sunday. 20 tablet 5  . Cholecalciferol (VITAMIN D3) 125 MCG (5000 UT) CAPS Take 1 capsule by mouth daily.    . Cyanocobalamin (B-12 PO) Take 1 tablet by mouth daily.    . diazepam (VALIUM) 5 MG tablet Take 5 mg by mouth daily as needed.    . dicyclomine (BENTYL) 10 MG capsule Take 1 capsule by mouth 4 (four) times daily as needed.    . hydrocortisone (ANUSOL-HC) 2.5 % rectal cream Place 1 application rectally 2 (two) times daily. Use up to 10 days at a time. (Patient taking differently: Place 1 application rectally 2 (two) times daily as needed for hemorrhoids or anal itching. Use up to 10 days at a time.) 30 g 2  . losartan (COZAAR) 100 MG tablet Take 1 tablet (100 mg total) by mouth daily. 30 tablet 4  . metoprolol tartrate (LOPRESSOR) 25 MG tablet Take 1 tablet (25 mg total) by mouth 2 (two) times daily. 60 tablet 2  . Omega-3 Fatty Acids (FISH OIL) 1000 MG CAPS Take 1 capsule by mouth daily.    Marland Kitchen  pantoprazole (PROTONIX) 40 MG tablet Take 1 tablet (40 mg total) by mouth daily. 30 minutes before breakfast 30 tablet 3  . potassium chloride SA (KLOR-CON) 20 MEQ tablet Take 1 tablet (20 mEq total) by mouth daily. 30 tablet 0  . rivaroxaban (XARELTO) 20 MG TABS tablet Take 1 tablet (20 mg total) by mouth daily with supper. 30 tablet 1  . vitamin C (ASCORBIC ACID) 500 MG tablet Take 500 mg by mouth daily.    Marland Kitchen VITAMIN E PO Take 1 tablet by mouth daily.    Marland Kitchen XIFAXAN 550 MG TABS tablet Take 1 tablet by mouth 3 (three) times daily.     No facility-administered medications prior to visit.     Allergies:   Patient has no known allergies.   Social History   Socioeconomic History  . Marital status:  Single    Spouse name: Not on file  . Number of children: Not on file  . Years of education: Not on file  . Highest education level: Not on file  Occupational History  . Occupation: Custodian     Employer: [employer]  Tobacco Use  . Smoking status: Former Smoker    Packs/day: 1.00    Years: 20.00    Pack years: 20.00    Types: Cigarettes  . Smokeless tobacco: Former Systems developer  . Tobacco comment: Quit smoking x 2 years    07/2015   SOMETIIMES i USE VAPOR   Substance and Sexual Activity  . Alcohol use: Not Currently    Comment: rarely  . Drug use: No  . Sexual activity: Not on file  Other Topics Concern  . Not on file  Social History Narrative   Divorced   Social Determinants of Health   Financial Resource Strain:   . Difficulty of Paying Living Expenses: Not on file  Food Insecurity:   . Worried About Charity fundraiser in the Last Year: Not on file  . Ran Out of Food in the Last Year: Not on file  Transportation Needs:   . Lack of Transportation (Medical): Not on file  . Lack of Transportation (Non-Medical): Not on file  Physical Activity:   . Days of Exercise per Week: Not on file  . Minutes of Exercise per Session: Not on file  Stress:   . Feeling of Stress : Not on file  Social Connections:   . Frequency of Communication with Friends and Family: Not on file  . Frequency of Social Gatherings with Friends and Family: Not on file  . Attends Religious Services: Not on file  . Active Member of Clubs or Organizations: Not on file  . Attends Archivist Meetings: Not on file  . Marital Status: Not on file     Family History:  The patient's family history includes Aneurysm in his mother; Prostate cancer in his father.   Review of Systems:   Please see the history of present illness.     General:  No chills, fever, night sweats or weight changes.  Cardiovascular:  No chest pain, dyspnea on exertion, edema, orthopnea, palpitations, paroxysmal  nocturnal dyspnea. Dermatological: No rash, lesions/masses Respiratory: No cough, dyspnea Urologic: No hematuria, dysuria Abdominal:   No nausea, vomiting, diarrhea, melena, or hematemesis. Positive for BRBPR.  Neurologic:  No visual changes, wkns, changes in mental status. Positive for dizziness.    All other systems reviewed and are otherwise negative except as noted above.   Physical Exam:    VS:  BP 112/69  Pulse (!) 54   Temp (!) 96.8 F (36 C) (Skin)   Ht 5\' 6"  (1.676 m)   Wt 214 lb (97.1 kg)   SpO2 97%   BMI 34.54 kg/m     Affect appropriate Healthy:  appears stated age 70: normal Neck supple with no adenopathy JVP normal no bruits no thyromegaly Lungs clear with no wheezing and good diaphragmatic motion Heart:  S1/S2 no murmur, no rub, gallop or click PMI normal Abdomen: benighn, BS positve, no tenderness, no AAA no bruit.  No HSM or HJR Distal pulses intact with no bruits No edema Neuro non-focal Skin warm and dry No muscular weakness   Wt Readings from Last 3 Encounters:  07/15/19 214 lb (97.1 kg)  06/10/19 216 lb (98 kg)  05/30/19 215 lb 3.2 oz (97.6 kg)     Studies/Labs Reviewed:   EKG:  EKG is not ordered today.    Recent Labs: 03/02/2019: ALT 20; TSH 0.843 04/17/2019: Magnesium 2.2 04/27/2019: BUN 11; Creatinine, Ser 0.76; Potassium 3.2; Sodium 136 06/23/2019: Hemoglobin 14.4; Platelets 225   Lipid Panel    Component Value Date/Time   CHOL 142 03/02/2019 0329   TRIG 89 03/02/2019 0329   HDL 32 (L) 03/02/2019 0329   CHOLHDL 4.4 03/02/2019 0329   VLDL 18 03/02/2019 0329   LDLCALC 92 03/02/2019 0329    Additional studies/ records that were reviewed today include:   Echocardiogram: 02/2019 IMPRESSIONS    1. The left ventricle has normal systolic function with an ejection fraction of 60-65%. The cavity size was normal. Left ventricular diastolic parameters were normal.  2. The right ventricle has normal systolic function. The cavity  was normal. There is no increase in right ventricular wall thickness.  3. Mild thickening of the mitral valve leaflet.  4. The aortic valve is tricuspid. Mild thickening of the aortic valve. Mild calcification of the aortic valve.  5. The aorta is normal in size and structure.   Event Monitor: 03/2019 NSR  No significant arrhythmia No PAF  Myovue 06/25/19:  EF 62% soft tissue attenuation no ischemia   Assessment:    No diagnosis found.   Plan:   In order of problems listed above:  1. Paroxysmal Atrial Fibrillation/Flutter - recent event monitor demonstrated no recurrence but he was hospitalized at the end of 03/2019 for recurrent atrial fibrillation and spontaneously converted back to NSR. He continues to experience dizziness and I suspect this is not secondary to a cardiac etiology He had no improvement in his dizziness with decreasing his lopressor to 12.5 mg bid  2. HTN - Well controlled.  Continue current medications and low sodium Dash type diet.    3. HLD - followed by PCP. Continue Atorvastatin at current dosing.   4. Dizziness/Chest Pain - he continues to have dizziness which is being worked-up by Neurology and ENT. Had myovue 06/25/19 that was normal With no ischemia , soft tissue attenuation EF 62% low risk study. Do not feel that further cardiac w/u needed at this time .    F/u with cards in a year     Signed, Jenkins Rouge, MD  07/15/2019 3:22 PM    [facility]. [address] [place], [place] [address] Phone: [phone] Fax: [phone]

## 2019-07-15 ENCOUNTER — Encounter: Payer: Self-pay | Admitting: Cardiovascular Disease

## 2019-07-15 ENCOUNTER — Ambulatory Visit (INDEPENDENT_AMBULATORY_CARE_PROVIDER_SITE_OTHER): Payer: BC Managed Care – PPO | Admitting: Cardiovascular Disease

## 2019-07-15 ENCOUNTER — Other Ambulatory Visit: Payer: Self-pay

## 2019-07-15 VITALS — BP 112/69 | HR 54 | Temp 96.8°F | Ht 66.0 in | Wt 214.0 lb

## 2019-07-15 DIAGNOSIS — I480 Paroxysmal atrial fibrillation: Secondary | ICD-10-CM

## 2019-07-15 NOTE — Patient Instructions (Signed)

## 2019-08-04 ENCOUNTER — Other Ambulatory Visit: Payer: Self-pay

## 2019-08-04 ENCOUNTER — Telehealth: Payer: Self-pay | Admitting: Cardiovascular Disease

## 2019-08-04 DIAGNOSIS — Z8546 Personal history of malignant neoplasm of prostate: Secondary | ICD-10-CM

## 2019-08-04 NOTE — Telephone Encounter (Signed)
Returned call to pt. No answer. Left msg to call back.  

## 2019-08-04 NOTE — Telephone Encounter (Signed)
Please give pt a call-- states he's still having problems w/ dizziness, SOB, and some chest pain.

## 2019-08-05 NOTE — Telephone Encounter (Signed)
Pt returned call

## 2019-08-05 NOTE — Telephone Encounter (Signed)
Called pt., no answer. Left message for pt to return call.  

## 2019-08-07 NOTE — Telephone Encounter (Signed)
F/u with primary and neuro this is a chronic issue and not his heart

## 2019-08-07 NOTE — Telephone Encounter (Addendum)
Spoke with pt who states that he feels dizzy when he drives but not when he walks. Pt report feeling palpitations at night when he lays down. Last around an hour has cough with it.  Pt denies any syncope with dizzy spells. Please advise

## 2019-08-07 NOTE — Telephone Encounter (Signed)
Pt notified and voiced understanding 

## 2019-08-08 ENCOUNTER — Other Ambulatory Visit: Payer: Self-pay

## 2019-08-08 DIAGNOSIS — Z20822 Contact with and (suspected) exposure to covid-19: Secondary | ICD-10-CM

## 2019-08-09 LAB — NOVEL CORONAVIRUS, NAA: SARS-CoV-2, NAA: NOT DETECTED

## 2019-08-18 ENCOUNTER — Telehealth: Payer: Self-pay | Admitting: Internal Medicine

## 2019-08-18 DIAGNOSIS — K649 Unspecified hemorrhoids: Secondary | ICD-10-CM

## 2019-08-18 NOTE — Telephone Encounter (Signed)
[phone]  PLEASE CALL PATIENT, HE IS OUT OF HIS MEDICATION AND STATING HE IS HAVING BLOOD IN HIS STOOL

## 2019-08-18 NOTE — Telephone Encounter (Signed)
Spoke with pt. Pt is out of his topical cream from [place]. Pt is having some blood in his stool. Pt noticed 1 teaspoon 2 days ago and today it was 1/2 a teaspoon. Pt notices blood when he wipes as well. Pt doesn't noticed a pouring out. Pt feels his hemorrhoid on his lt side is sore. Pt is taking warm baths to help. Pt uses topical creams each time he has a bowel movement like preparation H or left over Anusol cr that pt has. Pt feels it helps some. Pt's hemorrhoid pain starts 30 mins after his bowel movement.

## 2019-08-19 MED ORDER — HYDROCORTISONE (PERIANAL) 2.5 % EX CREA: 1.0000 "application " | TOPICAL_CREAM | Freq: Four times a day (QID) | CUTANEOUS | 2 refills | Status: DC

## 2019-08-19 NOTE — Addendum Note (Signed)
Addended by: Annitta Needs on: 08/19/2019 12:02 PM   Modules accepted: Orders

## 2019-08-19 NOTE — Telephone Encounter (Signed)
There should be a refill for [employer] cream on file. I have sent in a prescription in case it is not there. Needs to use this 4 times a day, wear gloves while applying and wash hands thereafter. We need to see him back in the office. I continue to highly recommend a colonoscopy as last was in 2014. In light of rectal bleeding, needs diagnostic evaluation.

## 2019-08-19 NOTE — Telephone Encounter (Signed)
Noted. Spoke with pt. Pt is aware of AB's recommendations. Pt has an apt this Thursday 08/11/19. Pt is aware that a new script was sent into his pharmacy and he should have a refill.

## 2019-08-21 ENCOUNTER — Other Ambulatory Visit: Payer: Self-pay

## 2019-08-21 ENCOUNTER — Encounter: Payer: Self-pay | Admitting: Gastroenterology

## 2019-08-21 ENCOUNTER — Ambulatory Visit (INDEPENDENT_AMBULATORY_CARE_PROVIDER_SITE_OTHER): Payer: BC Managed Care – PPO | Admitting: Gastroenterology

## 2019-08-21 VITALS — BP 135/68 | HR 65 | Temp 96.6°F | Ht 66.0 in | Wt 210.4 lb

## 2019-08-21 DIAGNOSIS — K5900 Constipation, unspecified: Secondary | ICD-10-CM

## 2019-08-21 DIAGNOSIS — K625 Hemorrhage of anus and rectum: Secondary | ICD-10-CM | POA: Diagnosis not present

## 2019-08-21 DIAGNOSIS — K6289 Other specified diseases of anus and rectum: Secondary | ICD-10-CM | POA: Insufficient documentation

## 2019-08-21 MED ORDER — LINACLOTIDE 145 MCG PO CAPS: 145.0000 ug | ORAL_CAPSULE | Freq: Every day | ORAL | 5 refills | Status: DC

## 2019-08-21 MED ORDER — LINACLOTIDE 72 MCG PO CAPS: 72.0000 ug | ORAL_CAPSULE | Freq: Every day | ORAL | 3 refills | Status: DC

## 2019-08-21 NOTE — Progress Notes (Signed)
Referring Provider: Jake Samples, PA* Primary Care Physician:  Jake Samples, PA-C Primary GI: Dr. Gala Romney   Chief Complaint  Patient presents with  . Rectal Bleeding    HPI:   Steven [name] is a 69 y.o. male presenting today with a history of rectal bleeding in the past due to radiation proctitis, last colonoscopy in 2014. Recently diagnosed with paroxysmal atrial flutter/afib Aug 2020. Started on Xarelto. CT abd without contrast Oct 2020 with cholelithiasis, no associated inflammatory changes. Constipation in Nov 2020 when seen at office visit. Declined colonoscopy in past. Declined rectal exam at last visit. GERD chronic, started Protonix at visit in Nov 2020. [place] hemorrhoid cream sent to pharmacy to address possible occult anorectal fissure.  Linzess 145 mcg helps with constipation. Finished last sample this morning. Worked quickly but almost "Too well". BMs had been coming out about size of finger. Had to strain. Rectal bleeding "checked up some" with cream. Picked up [facility] cream, using it every time he has a BM. Sometimes BM will cause a sharp pain when passing. Feels like a popcorn husk coming out. Feels like it's on the left side.   GERD is improved with Protonix. Occasional indigestion when laying down.    Past Medical History:  Diagnosis Date  . Arthritis   . Atrial fibrillation ([place])   . Atrial flutter ([place])   . Cancer [facility])    prostate s/p radiation Mar 2013  . Chest pain   . Fracture 08/17/2015   MULTIPLE RIB FRACTURES     FROM FALL   . History of radiation therapy 08/22/11-10/13/11   prostate  . Hyperlipemia   . Hypertension     Past Surgical History:  Procedure Laterality Date  . 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 Normal appearing colon  . [place]  . RADIOACTIVE SEED IMPLANT     Prostate    Current  Outpatient Medications  Medication Sig Dispense Refill  . amLODipine (NORVASC) 10 MG tablet Take 1 tablet (10 mg total) by mouth daily. 30 tablet 6  . atorvastatin (LIPITOR) 20 MG tablet Take 1 tablet (20 mg total) by mouth every Tuesday, Thursday, Saturday, and Sunday. 20 tablet 5  . Cholecalciferol (VITAMIN D3) 125 MCG (5000 UT) CAPS Take 1 capsule by mouth daily.    . Cyanocobalamin (B-12 PO) Take 1 tablet by mouth daily.    . diazepam (VALIUM) 5 MG tablet Take 5 mg by mouth daily as needed.    Marland Kitchen losartan (COZAAR) 100 MG tablet Take 1 tablet (100 mg total) by mouth daily. 30 tablet 4  . metoprolol tartrate (LOPRESSOR) 25 MG tablet Take 1 tablet (25 mg total) by mouth 2 (two) times daily. 60 tablet 2  . pantoprazole (PROTONIX) 40 MG tablet Take 1 tablet (40 mg total) by mouth daily. 30 minutes before breakfast 30 tablet 3  . potassium chloride SA (KLOR-CON) 20 MEQ tablet Take 1 tablet (20 mEq total) by mouth daily. 30 tablet 0  . rivaroxaban (XARELTO) 20 MG TABS tablet Take 1 tablet (20 mg total) by mouth daily with supper. 30 tablet 1  . vitamin C (ASCORBIC ACID) 500 MG tablet Take 500 mg by mouth daily.    Marland Kitchen VITAMIN E PO Take 1 tablet by mouth daily.    . hydrocortisone (ANUSOL-HC) 2.5 % rectal cream Place 1 application rectally 4 (four) times daily. 30 g  2  . linaclotide (LINZESS) 72 MCG capsule Take 1 capsule (72 mcg total) by mouth daily before breakfast. 90 capsule 3   No current facility-administered medications for this visit.    Allergies as of 08/21/2019  . (No Known Allergies)    Family History  Problem Relation Age of Onset  . Aneurysm Mother   . Prostate cancer Father   . Colon cancer Neg Hx   . Colon polyps Neg Hx     Social History   Socioeconomic History  . Marital status: Single    Spouse name: Not on file  . Number of children: Not on file  . Years of education: Not on file  . Highest education level: Not on file  Occupational History  . Occupation:  Custodian     Employer: [employer]  Tobacco Use  . Smoking status: Former Smoker    Packs/day: 1.00    Years: 20.00    Pack years: 20.00    Types: Cigarettes  . Smokeless tobacco: Former Systems developer  . Tobacco comment: Quit smoking x 2 years    07/2015   SOMETIIMES i USE VAPOR   Substance and Sexual Activity  . Alcohol use: Not Currently    Comment: rarely  . Drug use: No  . Sexual activity: Not on file  Other Topics Concern  . Not on file  Social History Narrative   Divorced   Social Determinants of Health   Financial Resource Strain:   . Difficulty of Paying Living Expenses: Not on file  Food Insecurity:   . Worried About Charity fundraiser in the Last Year: Not on file  . Ran Out of Food in the Last Year: Not on file  Transportation Needs:   . Lack of Transportation (Medical): Not on file  . Lack of Transportation (Non-Medical): Not on file  Physical Activity:   . Days of Exercise per Week: Not on file  . Minutes of Exercise per Session: Not on file  Stress:   . Feeling of Stress : Not on file  Social Connections:   . Frequency of Communication with Friends and Family: Not on file  . Frequency of Social Gatherings with Friends and Family: Not on file  . Attends Religious Services: Not on file  . Active Member of Clubs or Organizations: Not on file  . Attends Archivist Meetings: Not on file  . Marital Status: Not on file    Review of Systems: Gen: see HPI CV: Denies chest pain, palpitations, syncope, peripheral edema, and claudication. Resp: Denies dyspnea at rest, cough, wheezing, coughing up blood, and pleurisy. GI: see HPI Derm: Denies rash, itching, dry skin Psych: Denies depression, anxiety, memory loss, confusion. No homicidal or suicidal ideation.  Heme: see HPI  Physical Exam: BP 135/68   Pulse 65   Temp (!) 96.6 F (35.9 C) (Temporal)   Ht 5\' 6"  (1.676 m)   Wt 210 lb 6.4 oz (95.4 kg)   BMI 33.96 kg/m  General:   Alert and  oriented. No distress noted. Pleasant and cooperative.  Head:  Normocephalic and atraumatic. Eyes:  Conjuctiva clear without scleral icterus. Lungs: clear bilaterally Cardiac:  S1 S2 present without murmurs Abdomen:  +BS, soft, non-tender and non-distended. No rebound or guarding. No HSM or masses noted. Rectal: no obvious fissure. Unable to complete internal exam due to discomfort.  Msk:  Symmetrical without gross deformities. Normal posture. Extremities:  Without edema. Neurologic:  Alert and  oriented x4 Psych:  Alert and cooperative. Normal mood and affect.  ASSESSMENT: Steven [name] is a 69 y.o. male presenting today with remote history of radiation proctitis, rectal bleeding, constipation, concern for occult anorectal fissure, on Xarelto with history of aflutter/afib.   Rectal bleeding: last colonoscopy in 2014 with radiation proctitis. Continues with rectal bleeding, rectal pain and discomfort that seems consistent with possible occult fissure. I was unable to complete DRE today due to discomfort. Symptomatic improvement historically with [facility] cream. Could very well be dealing with chronic radiation proctitis as well; previously, he declined diagnostic colonoscopy but is willing to pursue now. Will arrange this in very near future. Will need to hold Xarelto X 2 days prior.  Constipation: continue with Linzess 72 mcg, as Linzess 145 mcg overshoots the mark. Miralax on any given day needed in addition.   GERD: Protonix once daily.   PLAN:   Proceed with diagnostic TCS with Dr. Gala Romney in near future: the risks, benefits, and alternatives have been discussed with the patient in detail. The patient states understanding and desires to proceed. Propofol due to polypharmacy.  HOLD XARELTO 2 days prior  Linzess 72 mcg daily, Miralax daily as needed  Continue with [facility] cream compounded with Nitro for now as he has noted some improvement with this. Await  findings of colonoscopy.   Continue Protonix daily.  Follow-up thereafter  Annitta Needs, PhD, ANP-BC [facility] Gastroenterology

## 2019-08-21 NOTE — Patient Instructions (Addendum)
Let's keep the Linzess dosing at the smaller amount, as you are having more loose stool with the higher dosage. In order to avoid constipation, take Miralax 1 capful each day if you do not have a good bowel movement that day.   Continue cream 3-4 times per day. Make sure to wash hands after well, as the cream has nitroglycerin in it and can cause dizziness, headache.  We are arranging a colonoscopy with Dr. Gala Romney in the near future. You will need to stop XARELTO for 2 days before this. We are making sure this is cleared with cardiology.  Further recommendations to follow!  I enjoyed seeing you again today! As you know, I value our relationship and want to provide genuine, compassionate, and quality care. I welcome your feedback. If you receive a survey regarding your visit,  I greatly appreciate you taking time to fill this out. See you next time!  Annitta Needs, PhD, ANP-BC [facility] Gastroenterology

## 2019-08-26 NOTE — Progress Notes (Signed)
Cc'ed to pcp °

## 2019-08-28 ENCOUNTER — Telehealth: Payer: Self-pay

## 2019-08-28 NOTE — Telephone Encounter (Signed)
Patient has number to call health dept and he will ask a family member to help him go on line.  His blood pressure varys at different times of the day.he is going to keep a log for a couple weeks and report back

## 2019-08-28 NOTE — Telephone Encounter (Signed)
Pt questioning if he can get COVID vaccine Pt questioning why is his BP up and down  Please call [phone]  Thanks renee

## 2019-09-03 ENCOUNTER — Ambulatory Visit: Payer: BC Managed Care – PPO | Admitting: Gastroenterology

## 2019-09-03 ENCOUNTER — Telehealth: Payer: Self-pay | Admitting: Internal Medicine

## 2019-09-03 NOTE — Telephone Encounter (Signed)
[phone] please call patient  He has questions about scheduling a procedure

## 2019-09-03 NOTE — Telephone Encounter (Signed)
Elmo Putt, have you checked with cardiology re: holding Xarelto 48 hours before TCS? He is also having concerns "with nerve in hand when he has BM". Doesn't see any bleeding at this time. Wants refill on cream AB gave him.

## 2019-09-04 ENCOUNTER — Other Ambulatory Visit: Payer: Self-pay

## 2019-09-04 ENCOUNTER — Telehealth: Payer: Self-pay

## 2019-09-04 MED ORDER — SUPREP BOWEL PREP KIT 17.5-3.13-1.6 GM/177ML PO SOLN: 1.0000 | ORAL | 0 refills | Status: DC

## 2019-09-04 NOTE — Telephone Encounter (Signed)
Ok to hold xarelto for 48 hours for colonoscopy

## 2019-09-04 NOTE — Telephone Encounter (Signed)
Sent message to pt's cardiologist. Waiting on a response. Spoke with pt, Pt is aware that he has refills on his topical cream at [facility].  AB- I spoke with pt about the nerve in his lt hand when he has a BM. I asked pt does he feel nerve pain in his hand when he isn't having a BM. Pt states he injured his elbow sometime ago and his hand is starting to bother him. Pt was advised to discuss with his PCP.

## 2019-09-04 NOTE — Telephone Encounter (Signed)
Noted. Routing to MB

## 2019-09-04 NOTE — Telephone Encounter (Signed)
Steven [name], pt also mentioned a spasm on the lt side abdomen. Pt feels the lt elbow pain, lt hand pain and spasm on lt abdomen may be connected. Pt advised to discuss elbow and hand again with PCP.

## 2019-09-04 NOTE — Telephone Encounter (Signed)
Called pt, TCS w/Propofol w/RMR scheduled for 11/17/19 at 1:45pm. Pt aware to hold Xarelto for 48 hours. Rx for prep sent to pharmacy. Orders entered.

## 2019-09-04 NOTE — Telephone Encounter (Signed)
Spoke with pt. Pt is aware that he needs to speak with his PCP about the injury of his lt elbow/hand.   Pt wanted to mention to AB that he gets diarrhea with the Linzess 72 mcg and took Pepto to stop the diarrhea. Pt had a total of 4 bowel movement yesterday. Pt said his stool turned darker due to the Pepto. Pt takes Miralax at night on the days he doesn't take the Linzess and says he has to take medication to help him have a bowel movement. At this time pt isn't taking Linzess 72 mcg due to the diarrhea it caused.

## 2019-09-04 NOTE — Telephone Encounter (Signed)
Agree 

## 2019-09-04 NOTE — Telephone Encounter (Signed)
Dr. Johnsie Cancel, Pt has a hx of rectal bleeding and Roseanne Kaufman, NP would like to schedule a colonoscopy with Dr. Gala Romney. Is it ok for pt to hold Xarelto 48 hours prior to his procedure? Please advise.

## 2019-09-05 NOTE — Telephone Encounter (Signed)
Pre-op and COVID test appt letter mailed with procedure instructions.

## 2019-09-08 ENCOUNTER — Other Ambulatory Visit: Payer: Self-pay

## 2019-09-08 ENCOUNTER — Telehealth: Payer: Self-pay

## 2019-09-08 MED ORDER — CLENPIQ 10-3.5-12 MG-GM -GM/160ML PO SOLN: 1.0000 | Freq: Once | ORAL | 0 refills | Status: AC

## 2019-09-08 NOTE — Telephone Encounter (Signed)
Received fax from [facility], insurance plan doesn't cover [place]. Clenpiq listed as an alternative. Clenpiq rx sent to pharmacy. New instructions mailed with letter.

## 2019-09-12 ENCOUNTER — Telehealth: Payer: Self-pay | Admitting: Orthopedic Surgery

## 2019-09-12 NOTE — Telephone Encounter (Signed)
Patient called to inquire about scheduling appointment for an elbow injury, which he states was a worker's comp/work-related injury; therefore, a second opinion evaluation. Discussed requesting Dr's notes, MRI report (Xray report and film is in Epic system) and MRI film. Also, if seen by another orthopaedist for final evaluation per worker's comp, that record as well. Aware to let us know when he has obtained this information so that he may bring in for Dr Aline Brochure to review.

## 2019-09-15 ENCOUNTER — Ambulatory Visit (INDEPENDENT_AMBULATORY_CARE_PROVIDER_SITE_OTHER): Payer: BC Managed Care – PPO | Admitting: Gastroenterology

## 2019-09-15 ENCOUNTER — Other Ambulatory Visit: Payer: Self-pay

## 2019-09-15 ENCOUNTER — Encounter: Payer: Self-pay | Admitting: Gastroenterology

## 2019-09-15 VITALS — BP 136/66 | HR 71 | Temp 98.0°F | Ht 66.0 in | Wt 203.4 lb

## 2019-09-15 DIAGNOSIS — K5900 Constipation, unspecified: Secondary | ICD-10-CM | POA: Diagnosis not present

## 2019-09-15 DIAGNOSIS — K625 Hemorrhage of anus and rectum: Secondary | ICD-10-CM | POA: Diagnosis not present

## 2019-09-15 DIAGNOSIS — K6289 Other specified diseases of anus and rectum: Secondary | ICD-10-CM

## 2019-09-15 NOTE — Progress Notes (Signed)
Referring Provider: Jake Samples, PA* Primary Care Physician:  Jake Samples, PA-C Primary GI Physician: Dr. Gala Romney  Chief Complaint  Patient presents with   Rectal Pain   Constipation    HPI:   Steven [name] is a 69 y.o. male presenting today for rectal pain.  History of rectal bleeding in the past due to radiation proctitis identified on colonoscopy in 2014 s/p APC therapy. Also with history of constipation and GERD. Recently diagnosed with paroxysmal atrial flutter/afib Aug 2020. Started on Xarelto. CT abd without contrast Oct 2020 with cholelithiasis, no associated inflammatory changes.   Last seen in our office on 08/21/2019.  GERD had improved on Protonix daily.  Constipation improved on Linzess 145 mcg but felt this almost worked "too well."  Rectal bleeding improved some with [employer] cream.  Reported sharp pain when passing a BM at times.  Unable to complete DRE due to discomfort.  He has advised to continue [place] [facility] cream compounded with nitro, decrease Linzess to 72 mcg daily, add MiraLAX daily as needed, and proceed with colonoscopy in the near future.  Colonoscopy scheduled for 11/17/2019.  Today: Had a rough weekend. Almost went to the ED. Used the bathroom and had a flare of rectal pain. Stool wasn't too hard. He was straining. Rectum is still very sore. Using [place] [facility] cream. Uses it 3-4 times a day. This is the only thing that stops the pain. Used Tucks with lidocaine and this caused significant burning.   Took one Linzess 72 mcg and this caused diarrhea. Hasn't taken Linzess since then. Has been taking MiraLAX daily. Worked initially but seems to have stopped. Over the last few days he is feeling constipated. Having a BM daily but having to strain. Stools are moderately hard. Stools are small and incomplete. Making him nauseated. No vomiting. Nausea resolves after having a BM. Some sharp rectal pain when having a BM. Will have  aching pain after a BM as well. Also with aching after walking/standing a lot. Sitting on the toilet no more than 5-6 minutes. Tried benefiber and metamucil in the past and didn't feel like it helped any more than MiraLAX. No prolapsing hemorrhoid tissue. Had toilet tissue hematochezia this morning. This has been off and on for a while.  Typically in the setting of constipation.  Tried sitz baths. States warm shower does just as well.   States when he sits on the toilet, he can see his blood pressure rising in his eyes. Occasional lightheadedness with [place] [facility] hemorrhoid cream with nitro. Denies pre-syncope or syncope. No chest pain or heart palpitations.  No shortness of breath or cough.   Past Medical History:  Diagnosis Date   Arthritis    Atrial fibrillation [facility])    Atrial flutter ([facility])    Cancer [facility])    prostate s/p radiation Mar 2013   Chest pain    Fracture 08/17/2015   MULTIPLE RIB FRACTURES     FROM FALL    History of radiation therapy 08/22/11-10/13/11   prostate   Hyperlipemia    Hypertension     Past Surgical History:  Procedure Laterality Date   COLONOSCOPY N/A 12/19/2012   TXH:FSFSEL 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 Normal appearing colon   KIDNEY SURGERY  1982   RADIOACTIVE SEED IMPLANT     Prostate    Current Outpatient Medications  Medication Sig Dispense Refill   amLODipine (NORVASC) 10 MG tablet  Take 1 tablet (10 mg total) by mouth daily. 30 tablet 6   Cholecalciferol (VITAMIN D3) 125 MCG (5000 UT) CAPS Take 1 capsule by mouth daily.     Cyanocobalamin (B-12 PO) Take 1 tablet by mouth daily.     diazepam (VALIUM) 5 MG tablet Take 5 mg by mouth daily as needed.     hydrocortisone (ANUSOL-HC) 2.5 % rectal cream Place 1 application rectally 4 (four) times daily. 30 g 2   losartan (COZAAR) 100 MG tablet Take 1 tablet (100 mg total) by mouth daily. 30 tablet 4    metoprolol tartrate (LOPRESSOR) 25 MG tablet Take 1 tablet (25 mg total) by mouth 2 (two) times daily. 60 tablet 2   pantoprazole (PROTONIX) 40 MG tablet Take 1 tablet (40 mg total) by mouth daily. 30 minutes before breakfast 30 tablet 3   potassium chloride SA (KLOR-CON) 20 MEQ tablet Take 1 tablet (20 mEq total) by mouth daily. 30 tablet 0   rivaroxaban (XARELTO) 20 MG TABS tablet Take 1 tablet (20 mg total) by mouth daily with supper. 30 tablet 1   rosuvastatin (CRESTOR) 10 MG tablet Take 10 mg by mouth at bedtime.     topiramate (TOPAMAX) 100 MG tablet Take 100 mg by mouth daily.     vitamin C (ASCORBIC ACID) 500 MG tablet Take 500 mg by mouth daily.     VITAMIN E PO Take 1 tablet by mouth daily.     Na Sulfate-K Sulfate-Mg Sulf (SUPREP BOWEL PREP KIT) 17.5-3.13-1.6 GM/177ML SOLN Take 1 kit by mouth as directed. 354 mL 0   No current facility-administered medications for this visit.    Allergies as of 09/15/2019   (No Known Allergies)    Family History  Problem Relation Age of Onset   Aneurysm Mother    Prostate cancer Father    Colon cancer Neg Hx    Colon polyps Neg Hx     Social History   Socioeconomic History   Marital status: Single    Spouse name: Not on file   Number of children: Not on file   Years of education: Not on file   Highest education level: Not on file  Occupational History   Occupation: Custodian     Employer: [employer]  Tobacco Use   Smoking status: Former Smoker    Packs/day: 1.00    Years: 20.00    Pack years: 20.00    Types: Cigarettes   Smokeless tobacco: Former Systems developer   Tobacco comment: Quit smoking x 2 years    07/2015   SOMETIIMES i USE VAPOR   Substance and Sexual Activity   Alcohol use: Not Currently    Comment: rarely   Drug use: No   Sexual activity: Not on file  Other Topics Concern   Not on file  Social History Narrative   Divorced   Social Determinants of Health   Financial Resource  Strain:    Difficulty of Paying Living Expenses: Not on file  Food Insecurity:    Worried About Charity fundraiser in the Last Year: Not on file   [employer] of Food in the Last Year: Not on file  Transportation Needs:    Lack of Transportation (Medical): Not on file   Lack of Transportation (Non-Medical): Not on file  Physical Activity:    Days of Exercise per Week: Not on file   Minutes of Exercise per Session: Not on file  Stress:    Feeling of Stress :  Not on file  Social Connections:    Frequency of Communication with Friends and Family: Not on file   Frequency of Social Gatherings with Friends and Family: Not on file   Attends Religious Services: Not on file   Active Member of Clubs or Organizations: Not on file   Attends Archivist Meetings: Not on file   Marital Status: Not on file    Review of Systems: Gen: Denies fever, chills. CV: See HPI Resp: See HPI GI: See HPI Derm: Denies rash Heme: See HPI   Physical Exam: BP 136/66    Pulse 71    Temp 98 F (36.7 C) (Temporal)    Ht 5' 6"  (1.676 m)    Wt 203 lb 6.4 oz (92.3 kg)    BMI 32.83 kg/m  General:   Alert and oriented. No distress noted. Pleasant and cooperative.  Head:  Normocephalic and atraumatic. Eyes:  Conjuctiva clear without scleral icterus. Heart:  S1, S2 present without murmurs appreciated. Lungs:  Clear to auscultation bilaterally. No wheezes, rales, or rhonchi. No distress.  Abdomen:  +BS, soft, non-tender and non-distended. No rebound or guarding. No HSM or masses noted. Rectal: No obvious anal fissure, external hemorrhoids, or prolapsing internal hemorrhoids. Unable to complete internal exam due to discomfort.  Msk:  Symmetrical without gross deformities. Normal posture. Extremities:  Without edema. Neurologic:  Alert and  oriented x4 Psych:  Alert and cooperative. Normal mood and affect.

## 2019-09-15 NOTE — Patient Instructions (Signed)
We will try to get your colonoscopy moved up.  Continue using the [place] [facility] hemorrhoid cream with nitroglycerin 4 times daily for now.  I am going to call [facility] to see about getting you a different cream.  I will call and let you know about this.  I will provide samples of Amitiza 8 mcg for constipation.  Start taking this once daily with breakfast.  If this does not cause significant diarrhea, increase this to twice daily with breakfast and dinner as this is the ideal dosing.  Call with a progress report in 2 weeks.  If this works well, I will send in a prescription for you.  Try not to strain.  Do not spend more than 2-3 minutes on the toilet.  Continue sits baths 2-3 times a day.  Add Benefiber or Metamucil daily.  We will follow up with you after your colonoscopy.  Call if you have questions or concerns prior.  [name], PA-C [facility]

## 2019-09-15 NOTE — Assessment & Plan Note (Addendum)
Rectal pain has been present since November 2020. Acutely worsened over the weekend following a BM. Reports sharp pain with BMs. Also with aching pain after a BM or walking/standing for long periods. Constipation not adequately managed only on MiraLAX daily as Linzess 72 mcg was too strong. BMs are daily, but small, incomplete, and require straining. History of radiation proctitis s/p APC therapy in 2014 and known internal hemorrhoids. He has been on [place] [facility] hemorrhoid cream compounded with 0.125% nitroglycerin 4 times daily since November 2020 without significant improvement. Notes the cream helps with the intense pain but is not resolving his symptoms.  Unable to complete DRE today due to pain during exam.  No obvious anal fissure, external hemorrhoids, or prolapsing internal hemorrhoids.   Rectal pain may be secondary to hemorrhoids or possible nonhealing anal fissure.  He may also have chronic radiation proctitis.  Patient needs colonoscopy for further evaluation.  He is currently scheduled for April 2021 but we will try to get this moved up.  Continue with [place] [facility] hemorrhoid cream compounded with 0.125% nitroglycerin 4 times daily for now.  I will call [facility] to see about getting compounded nifedipine cream to try instead. Hesitant to increase nitroglycerine strength as he reports some lightheadedness at times when applying the cream. We will try to get his colonoscopy moved up. Hold Xarelto 48 hours prior to procedure.  Try Amitiza 8 mcg daily.  If this does not cause diarrhea, he will increase to twice daily.  Progress report in 2 weeks.  If this works well, will send in a prescription. Add Benefiber or Metamucil daily. Limit toilet x2-3 minutes. Avoid straining. Sitz bath 2-3 times daily. Follow-up after colonoscopy.  Call if questions or concerns prior.

## 2019-09-15 NOTE — Assessment & Plan Note (Addendum)
Intermittent toilet tissue hematochezia in the setting of constipation that is not adequately managed at this time.  Also with rectal pain that has been present for several months now.  He continues to using [employer] hemorrhoid cream compounded with 0.125% nitroglycerin 3 times daily-4 times daily for suspected anal fissure which she has been using for several months without any significant improvement. The cream helps when applied but overall the pain isn't resolving. History of radiation proctitis s/p APC therapy in 2014 with internal hemorrhoids noted at that time.  DRE today incomplete due to reported pain on exam. He is currently scheduled for colonoscopy in April 2021.   Rectal bleeding may be secondary to internal hemorrhoids or anal fissure.  May also have component of chronic radiation proctitis.   He will continue [place] [facility] hemorrhoid cream compounded with 0.125% nitroglycerin 4 times daily for now.  I will call [place] [facility] to see about getting nifedipine compounded.  Hesitant to increase strength of nitroglycerin as patient reports occasional lightheadedness. Try Amitiza 8 mcg for constipation.  He will start taking this once daily.  If this does not cause significant diarrhea as Linzess has in the past, he can increase to twice daily.  Advised to call with a progress report in 2 weeks.  We will send in a prescription if this works well. Limit toilet time to 2-3 minutes. Avoid straining. 2-3 sits baths daily. Add Benefiber or Metamucil daily. We will try to get colonoscopy moved up. Hold Xarelto 48 hours prior to procedure.  Follow-up after procedure.

## 2019-09-15 NOTE — Assessment & Plan Note (Signed)
Not adequately managed. Linzess at the lowest dose also too strong. Continues of MiraLAX and passes small incomplete BMs daily that require straining. Trial of Amitiza 8 mcg. He will start with taking this once daily with food. If no diarrhea, will increase to twice daily. He is to call with a progress report in 2 weeks. Will send in prescription if this works well.   Of note, we are trying to get his TCS moved up (currently scheduled for April 2021) as he continues with rectal pain and intermittent toilet tissue hematochezia which may be secondary to internal hemorrhoids, anal fissure, or chronic radiation proctitis as discussed above and below.   Follow-up after TCS. Call if questions or concerns prior.

## 2019-09-16 ENCOUNTER — Telehealth: Payer: Self-pay

## 2019-09-16 ENCOUNTER — Encounter: Payer: Self-pay | Admitting: Gastroenterology

## 2019-09-16 NOTE — Telephone Encounter (Signed)
Pt called from [place] asking about his topical rectal cr. Pt was seen yesterday by [facility] and was notified at his apt that our office would contact pt when [facility] spoke with [facility] about a topical rectal cr. [facility] has spoken with [facility] and they are filling pts topical cream today. Pt is aware.

## 2019-09-16 NOTE — Telephone Encounter (Signed)
Called pt, TCS w/Prop w/RMR moved up to 10/23/19 at 2:45pm. Endo scheduler informed. Pre-op and COVID test 10/20/19. Letter mailed with new procedure instructions.

## 2019-09-17 ENCOUNTER — Telehealth: Payer: Self-pay | Admitting: Internal Medicine

## 2019-09-17 NOTE — Telephone Encounter (Signed)
Pt said he was returning a call from yesterday. AM said it was probably [facility]. I told patient someone would be calling him back. [phone]

## 2019-09-17 NOTE — Telephone Encounter (Signed)
Spoke with pt. Pt wanted to mention that he took Linzess yesterday after feeling Amitiza 8 mcg once daily didn't help much. Pt also wanted to know if he was using the cream [facility] called in or if it was Hc.2.5% cr. Called [place] and pt was given the new cr given by [facility]. Pt was advised to use it as directed and taking Amitiza 8 mcg bid with food per [facility]. Pt will call back if needed.

## 2019-09-17 NOTE — Telephone Encounter (Signed)
I have not called patient today. Looks like last communication was with Elmo Putt letting him know [employer] was filling his rectal cream.   Elmo Putt, let me know if anything further is needed.

## 2019-09-17 NOTE — Telephone Encounter (Signed)
I spoke to pt yesterday, not expecting return call.

## 2019-09-18 NOTE — Progress Notes (Signed)
Cc'ed to pcp °

## 2019-09-19 ENCOUNTER — Telehealth: Payer: Self-pay

## 2019-09-19 MED ORDER — APIXABAN 5 MG PO TABS: 5.0000 mg | ORAL_TABLET | Freq: Two times a day (BID) | ORAL | 5 refills | Status: DC

## 2019-09-19 NOTE — Telephone Encounter (Signed)
Pt called with c/o diarrhea. Pt took two Amitiza pills yesterday along with a cup of Prune juice since he hadn't had a bowel movement yet. Pt had a good bowel movement yesterday evening and after that bowel movement, diarrhea started. Pt is aware that his diarrhea is most likely coming from taking Prune Juice with 2 Amitiza pills. Pt is concerned with the knife like pain he feels when having a bowel movement and is apply the new topical cream that [facility] prescribed through [place]. Pt wants to know if there is anything the providers can do to fix that prior to the TCS. Pt also mentioned that he's read info on colitis and feels that he may have colitis. Pt hasn't started any new medications. As mentioned in note on 09/17/2019, pt took a Linzess pill when he though he wasn't going to have a BM.

## 2019-09-19 NOTE — Telephone Encounter (Signed)
09-19-19/10:57am Pt states 30-40 mins after taking coumadin he taste blood in his mouth. Please [phone]  Thanks renee

## 2019-09-19 NOTE — Telephone Encounter (Addendum)
Per our records, pt is on Xarelto not Coumadin. He is on correct dose of 20mg  daily based on CrCl > 50 (his is 184mL/min). He should remain on anticoagulation for afib due to CHADS2VASc score of 2 (age, HTN).  Left message for pt to discuss. Need to inquire if pt actually has blood in his mouth or if he just has a funny taste in his mouth. Xarelto is not fully absorbed 30 minutes after taking a dose.

## 2019-09-19 NOTE — Telephone Encounter (Signed)
Can change to eliquis 5 bid to see if helps Should f/u with primary for post nasal drip  He has a lot of somatic complaints

## 2019-09-19 NOTE — Telephone Encounter (Signed)
Continue with Amitiza twice a day with food. Continue with cream from [place], using 4 times per day. Will take some time to heal if dealing with fissure. Can use lidocaine ointment from over-the-counter as needed. Only take Amitiza, not Linzess at this time.

## 2019-09-19 NOTE — Telephone Encounter (Signed)
Patient called back. States that he get the feeling like there is something running back of his throat and when he goes to spit it out, it is blood. States it happens mostly 30-45 min after taking his mediation, but sometimes in the AM. States its happened ever since he started Xarelto. Was hoping it would go away. Thought it was acid reflux but states its red when he spits it up.  Suggested that maybe we try a different blood thinner, such as Eliquis, but I would send message to Dr. Johnsie Cancel for advice.

## 2019-09-19 NOTE — Telephone Encounter (Signed)
Returned call to pt. He is agreeable to changing to Eliquis 5mg  BID. Rx has been sent to his local [facility], advised him to start Eliquis 24 hours after his last dose of Xarelto and emphasized that it needs to be taken twice daily about 12 hours apart. He has Pharmacist, community and qualifies for $10/month copay card. This has been activated and sent to pharmacy.

## 2019-09-20 DIAGNOSIS — G4730 Sleep apnea, unspecified: Secondary | ICD-10-CM | POA: Insufficient documentation

## 2019-09-20 DIAGNOSIS — G43719 Chronic migraine without aura, intractable, without status migrainosus: Secondary | ICD-10-CM | POA: Insufficient documentation

## 2019-09-20 DIAGNOSIS — G4700 Insomnia, unspecified: Secondary | ICD-10-CM | POA: Insufficient documentation

## 2019-09-20 DIAGNOSIS — G4733 Obstructive sleep apnea (adult) (pediatric): Secondary | ICD-10-CM | POA: Insufficient documentation

## 2019-09-20 DIAGNOSIS — R42 Dizziness and giddiness: Secondary | ICD-10-CM | POA: Insufficient documentation

## 2019-09-20 DIAGNOSIS — F5104 Psychophysiologic insomnia: Secondary | ICD-10-CM | POA: Insufficient documentation

## 2019-09-21 ENCOUNTER — Emergency Department (HOSPITAL_COMMUNITY)
Admission: EM | Admit: 2019-09-21 | Discharge: 2019-09-21 | Disposition: A | Discharge status: Home / Self Care | Payer: BC Managed Care – PPO | Attending: Emergency Medicine | Admitting: Emergency Medicine

## 2019-09-21 ENCOUNTER — Encounter (HOSPITAL_COMMUNITY): Payer: Self-pay | Admitting: *Deleted

## 2019-09-21 ENCOUNTER — Other Ambulatory Visit: Payer: Self-pay

## 2019-09-21 DIAGNOSIS — I10 Essential (primary) hypertension: Secondary | ICD-10-CM | POA: Diagnosis not present

## 2019-09-21 DIAGNOSIS — K602 Anal fissure, unspecified: Secondary | ICD-10-CM | POA: Insufficient documentation

## 2019-09-21 DIAGNOSIS — F17290 Nicotine dependence, other tobacco product, uncomplicated: Secondary | ICD-10-CM | POA: Diagnosis not present

## 2019-09-21 DIAGNOSIS — Z7901 Long term (current) use of anticoagulants: Secondary | ICD-10-CM | POA: Diagnosis not present

## 2019-09-21 DIAGNOSIS — K640 First degree hemorrhoids: Secondary | ICD-10-CM | POA: Insufficient documentation

## 2019-09-21 DIAGNOSIS — Z79899 Other long term (current) drug therapy: Secondary | ICD-10-CM | POA: Diagnosis not present

## 2019-09-21 DIAGNOSIS — K6289 Other specified diseases of anus and rectum: Secondary | ICD-10-CM | POA: Diagnosis present

## 2019-09-21 MED ORDER — HYDROCORTISONE ACETATE 25 MG RE SUPP: 25.0000 mg | Freq: Two times a day (BID) | RECTAL | 0 refills | Status: DC

## 2019-09-21 MED ORDER — ACETAMINOPHEN 500 MG PO TABS
500.0000 mg | ORAL_TABLET | Freq: Once | ORAL | Status: AC
  Administered 2019-09-21: 500 mg via ORAL
  Filled 2019-09-21: qty 1

## 2019-09-21 NOTE — ED Triage Notes (Signed)
Pt is here due to rectal pain from hemorrhoids which is sharp and severe.  Pt can not tell me the size of his hemorrhoids, he states that he did not see any blood today.

## 2019-09-21 NOTE — ED Provider Notes (Addendum)
[facility] EMERGENCY DEPARTMENT Provider Note   CSN: [id number] Arrival date & time: 09/21/19  1723     History Chief Complaint  Patient presents with  . Hemorrhoids    Steven [name] is a 69 y.o. male with PMH significant for paroxysmal atrial fibrillation on Eliquis, HTN, GERD, HLD, and prostate CA with resultant radiation proctitis who presents to the ED with complaints of painful hemorrhoids.  Patient states that his painful hemorrhoids have been persistent for over 2 months. He also endorses constipation for nearly 10 years despite taking MiraLAX and watching his diet closely. Patient is followed by [facility], Dr. Rourk, and has a colonoscopy scheduled for 10/23/2019. Patient was evaluated by Kristen Harper, PA-C, 09/15/2019 for these same complaints and is in process of trying to move up his scheduled colonoscopy. In the interim, he is encouraged to continue using his nitroglycerin hemorrhoid cream four times daily and Amitiza 8 mcg daily, with plan to increase to twice daily if fails to induce loose stools. She is also added Benefiber/Metamucil daily and encouraged 2-3 sitz bath daily. Patient states that he simply cannot wait until 10/23/2019 and that his pain is becoming increasingly intolerable and affecting his quality of life despite his adherence to prescribed regimen. He denies any fevers or chills, headache or dizziness, chest pain or difficulty breathing, vomiting, hematochezia, melena, or urinary symptoms.  HPI     Past Medical History:  Diagnosis Date  . Arthritis   . Atrial fibrillation (HCC)   . Atrial flutter (HCC)   . Cancer (HCC)    prostate s/p radiation Mar 2013  . Chest pain   . Fracture 08/17/2015   MULTIPLE RIB FRACTURES     FROM FALL   . History of radiation therapy 08/22/11-10/13/11   prostate  . Hyperlipemia   . Hypertension     Patient Active Problem List   Diagnosis Date Noted  . Rectal pain 08/21/2019  . Nausea without  vomiting 05/30/2019  . Atrial flutter (HCC) 04/17/2019  . Atrial flutter, paroxysmal (HCC) 03/02/2019  . HTN (hypertension) 03/02/2019  . Elevated troponin 03/01/2019  . Hypokalemia 03/01/2019  . Constipation 08/02/2017  . Hemorrhoids 08/02/2017  . Fall 08/18/2015  . Open fracture of left elbow 08/18/2015  . Rib fractures 08/17/2015  . Radiation proctitis 01/30/2013  . Rectal bleeding 12/10/2012  . Chest pain 05/30/2012  . Elevated lipids 05/30/2012  . Palpitations 05/30/2012  . Dyspnea 05/30/2012  . Obese 05/30/2012  . Prostate cancer (HCC) 08/13/2011    Past Surgical History:  Procedure Laterality Date  . COLONOSCOPY N/A 12/19/2012   RMR:Rectal 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 Normal appearing colon  . KIDNEY SURGERY  1982  . RADIOACTIVE SEED IMPLANT     Prostate       Family History  Problem Relation Age of Onset  . Aneurysm Mother   . Prostate cancer Father   . Colon cancer Neg Hx   . Colon polyps Neg Hx     Social History   Tobacco Use  . Smoking status: Former Smoker    Packs/day: 1.00    Years: 20.00    Pack years: 20.00    Types: Cigarettes  . Smokeless tobacco: Former User  . Tobacco comment: Quit smoking x 2 years    07/2015   SOMETIIMES i USE VAPOR   Substance Use Topics  . Alcohol use: Not Currently    Comment: rarely  . Drug use:   No    Home Medications Prior to Admission medications   Medication Sig Start Date End Date Taking? Authorizing Provider  amLODipine (NORVASC) 10 MG tablet Take 1 tablet (10 mg total) by mouth daily. 04/27/19   Pollina, Christopher J, MD  apixaban (ELIQUIS) 5 MG TABS tablet Take 1 tablet (5 mg total) by mouth 2 (two) times daily. 09/19/19   Nishan, Peter C, MD  Cholecalciferol (VITAMIN D3) 125 MCG (5000 UT) CAPS Take 1 capsule by mouth daily.    [provider]  Cyanocobalamin (B-12 PO) Take 1 tablet by mouth daily.    [provider]    diazepam (VALIUM) 5 MG tablet Take 5 mg by mouth daily as needed. 05/13/19   [provider]  hydrocortisone (ANUSOL-HC) 2.5 % rectal cream Place 1 application rectally 4 (four) times daily. 08/19/19   Boone, Anna W, NP  hydrocortisone (ANUSOL-HC) 25 MG suppository Place 1 suppository (25 mg total) rectally 2 (two) times daily. 09/21/19   ,  L, PA-C  losartan (COZAAR) 100 MG tablet Take 1 tablet (100 mg total) by mouth daily. 04/19/19   Emokpae, Courage, MD  metoprolol tartrate (LOPRESSOR) 25 MG tablet Take 1 tablet (25 mg total) by mouth 2 (two) times daily. 04/18/19   Emokpae, Courage, MD  Na Sulfate-K Sulfate-Mg Sulf (SUPREP BOWEL PREP KIT) 17.5-3.13-1.6 GM/177ML SOLN Take 1 kit by mouth as directed. 09/04/19   Rourk, Robert M, MD  pantoprazole (PROTONIX) 40 MG tablet Take 1 tablet (40 mg total) by mouth daily. 30 minutes before breakfast 05/30/19   Boone, Anna W, NP  potassium chloride SA (KLOR-CON) 20 MEQ tablet Take 1 tablet (20 mEq total) by mouth daily. 04/27/19   Pollina, Christopher J, MD  rosuvastatin (CRESTOR) 10 MG tablet Take 10 mg by mouth at bedtime. 08/22/19   [provider]  topiramate (TOPAMAX) 100 MG tablet Take 100 mg by mouth daily. 09/04/19   [provider]  vitamin C (ASCORBIC ACID) 500 MG tablet Take 500 mg by mouth daily.    [provider]  VITAMIN E PO Take 1 tablet by mouth daily.    [provider]    Allergies    Patient has no known allergies.  Review of Systems   Review of Systems  Constitutional: Negative for fever.  Gastrointestinal: Positive for constipation and rectal pain. Negative for abdominal pain and vomiting.  Genitourinary: Negative for difficulty urinating.    Physical Exam Updated Vital Signs BP (!) 184/101   Pulse 92   Temp 98.3 F (36.8 C) (Oral)   Resp 16   Wt 92.1 kg   SpO2 100%   BMI 32.77 kg/m   Physical Exam Vitals and nursing note reviewed. Exam conducted with a chaperone  present.  Constitutional:      General: He is not in acute distress.    Appearance: Normal appearance. He is not ill-appearing.  HENT:     Head: Normocephalic and atraumatic.  Eyes:     General: No scleral icterus.    Conjunctiva/sclera: Conjunctivae normal.  Cardiovascular:     Rate and Rhythm: Normal rate and regular rhythm.     Pulses: Normal pulses.     Heart sounds: Normal heart sounds.  Pulmonary:     Effort: Pulmonary effort is normal.     Breath sounds: Normal breath sounds.  Abdominal:     Comments: Soft, nondistended. No focal TTP. No guarding. No overlying skin changes. Bowel sounds normoactive.  Genitourinary:    Comments: Rectum:   Overlying white-cream. Evidence of posterior anal fissure. Perform digital exam and appreciate an internal hemorrhoid however no significant stool in rectal vault. No gross melena or hematochezia on exam. Skin:    General: Skin is dry.  Neurological:     Mental Status: He is alert and oriented to person, place, and time.     GCS: GCS eye subscore is 4. GCS verbal subscore is 5. GCS motor subscore is 6.  Psychiatric:        Mood and Affect: Mood normal.        Behavior: Behavior normal.        Thought Content: Thought content normal.       ED Results / Procedures / Treatments   Labs (all labs ordered are listed, but only abnormal results are displayed) Labs Reviewed - No data to display  EKG None  Radiology No results found.  Procedures Procedures (including critical care time)  Medications Ordered in ED Medications  acetaminophen (TYLENOL) tablet 500 mg (500 mg Oral Given 09/21/19 1916)    ED Course  I have reviewed the triage vital signs and the nursing notes.  Pertinent labs & imaging results that were available during my care of the patient were reviewed by me and considered in my medical decision making (see chart for details).    MDM Rules/Calculators/A&P                      Patient is presented to the ED for a  62-monthhistory of rectal pain secondary to hemorrhoids and slow healing posterior anal fissure.  Physical exam performed was benign.  No abdominal tenderness palpation.  Rectal exam was significant for posterior anal fissure and hemorrhoid noted on digital exam.  No stool in rectal vault concerning for impaction.  Patient is endorsing ongoing, chronic rectal pain.  I reviewed patient's medical record and he was evaluated by his gastroenterology PA 6 days ago and given instructions to increase his Amitiza to 8 mcg twice daily if he is experiencing continued constipation.  I would encourage him to follow those instructions of increased Amitiza as well as prescribed an Anusol suppository in addition to his other hemorrhoid regimen.  Patient is not in any acute distress and I have low suspicion for thrombosed or irreducible hemorrhoid based on my benign physical exam.  Patient is denying any urinary symptoms, fevers or chills, abdominal pain, vomiting, inability to eat or drink, or any other concerning history.  Plan for him to follow-up with his gastroenterologist tomorrow given his ED encounter to see if they can work with him about moving up his colonoscopy, which was his primary complaint.  Strict ED return precautions discussed with the patient.  Patient voiced understanding and is agreeable to the plan.  7:34 PM After discharge, patient is no longer agreeable and states that he wants to be admitted because his pain continues to persist.  He cites his atrial fibrillation, for which he takes Eliquis, as the reason for admission.  He wants emergent colonoscopy because he is frustrated with the duration for which he has dealt with his hemorrhoids and fissure.  Discussed case with Dr. MSabra Heckwho is in agreement with assessment and plan.    Final Clinical Impression(s) / ED Diagnoses Final diagnoses:  Grade I hemorrhoids  Anal fissure    Rx / DC Orders ED Discharge Orders         Ordered     hydrocortisone (ANUSOL-HC) 25 MG suppository  2 times  daily     09/21/19 1852           Corena Herter, PA-C 09/21/19 1849    Corena Herter, PA-C 09/21/19 1934    Noemi Chapel, MD 09/22/19 1459

## 2019-09-21 NOTE — ED Notes (Signed)
Pt very unhappy about being discharged. EDP aware. Pt feels like "we just took his money and are putting him out". Pt concerned about BP being elevated and having A-fib and "stroking out" due to it. Pt worried that his BP is only going to "go up d/t his pain". Pt calling his ride and telling them to "get him the [place] out of this place". States he "cannot believe that he is treated like this in his own town". Pt signed discharge paperwork and was given a wheelchair ride to the front. Pt's ride showed up and wife stated "we are going to report them".

## 2019-09-21 NOTE — Discharge Instructions (Signed)
Your gastroenterologist would like you to take Amitiza 8 mcg twice daily if your constipation had failed to improve despite once daily treatment.  Please adhere to those instructions as well as continue with your other medications, as prescribed.  I have also prescribed you Anusol suppository which you can take for additional rectal relief.  Please follow-up with your gastroenterologist as well as her primary care provider regarding today's encounter.  Please return to the ED or seek immediate medical attention for any new or worsening symptoms.

## 2019-09-22 ENCOUNTER — Telehealth: Payer: Self-pay | Admitting: Internal Medicine

## 2019-09-22 NOTE — Telephone Encounter (Addendum)
[facility] clinical pool: please arrange CT pelvis with contrast due to persistent rectal pain.   Alicia: please have him avoid any enemas at this point. Try to steer clear of Pepto as then he could become constipated. Amitiza 8 mcg BID, add Miralax on any given day he does not have a bowel movement. Difficult to sort out what his regimen is currently.   History of radiation proctitis in the past. Please arrange CT pelvis ASAP. Needs to be done this week. Further recommendations to follow after review of CT.

## 2019-09-22 NOTE — Telephone Encounter (Signed)
If his rectal pain is persistent and occurring even without BM, then we need to exclude any co-existing etiologies. Recommend CT pelvis with contrast to assess for any complicating features. [facility] cream was sent in 1 week ago; he should have plenty of this. Has he picked it up?

## 2019-09-22 NOTE — Telephone Encounter (Signed)
Pt called back and said he had more questions for the nurse and was asking about samples. I told him that AM wasn't available at the moment and I would let her know that he had called back. [phone]

## 2019-09-22 NOTE — Telephone Encounter (Signed)
Samples of Amitiza 8 mcg are ready for pickup per pts request.

## 2019-09-22 NOTE — Telephone Encounter (Signed)
AB, I was going to give pt the info. Pt called this morning. Pt seen recently by [facility] and was in the ER last night with hemorrhoid pain and wanted to speak with the nurse. He also said he needed more of the RX cream called into [place]. Please review ED note and let me know if anything needs to be added to the previous message sent. Please advise. [phone]

## 2019-09-22 NOTE — Telephone Encounter (Signed)
Pt seen recently by [facility] and was in the ER last night with hemorrhoid pain and wanted to speak with the nurse. He also said he needed more of the RX cream called into [place]. Please advise. [phone]

## 2019-09-22 NOTE — Telephone Encounter (Signed)
Spoke with pt. Pt wasn't happy about his ED visit yesterday. Pt states his blood pressure was high and he was sent home. Pt is aware that we are requesting a CT scan. Pt is very concerned and was advised to follow recommendations by AB. Pt is ok to have the CT scan. Pt did pickup the cream but when the diarrhea started, he had to reapply the cream. Pt is wondering if his TCS can't be moved up, can he be referred to [facility] to have it done. Pt states he is getting weaker and weaker and doesn't feel he can take the pain. Pt states the pain caused his BP to be elevated at the ED last night. Pt had diarrhea Friday and Saturday and felt constipated on Sunday. Pt took Amitiza as directed and added a fleet enema, metamucil and benefiber. Pt also mentioned when he had diarrhea, he used Pepto. Pt was advised to stick with the Amitiza and rectal cream prescribed.

## 2019-09-22 NOTE — Telephone Encounter (Signed)
Pt was advised to stay away from enemas at this time. Pt can use Miralax on the days he doesn't have a bowel movement.

## 2019-09-23 ENCOUNTER — Other Ambulatory Visit: Payer: Self-pay

## 2019-09-23 ENCOUNTER — Other Ambulatory Visit (HOSPITAL_COMMUNITY)
Admission: RE | Admit: 2019-09-23 | Discharge: 2019-09-23 | Disposition: A | Discharge status: Home / Self Care | Payer: BC Managed Care – PPO | Source: Ambulatory Visit | Attending: Gastroenterology | Admitting: Gastroenterology

## 2019-09-23 DIAGNOSIS — K6289 Other specified diseases of anus and rectum: Secondary | ICD-10-CM | POA: Diagnosis not present

## 2019-09-23 LAB — CREATININE, SERUM
Creatinine, Ser: 1.04 mg/dL (ref 0.61–1.24)
GFR calc Af Amer: >60 mL/min (ref >60)
GFR calc non Af Amer: >60 mL/min (ref >60)

## 2019-09-23 NOTE — Telephone Encounter (Signed)
Pt called office, informed him of CT appt and details. Creatinine order faxed to [facility] lab.

## 2019-09-23 NOTE — Telephone Encounter (Signed)
CT pelvis scheduled for 09/25/19 at 10:30am, arrive at 10:15am. Needs creatinine. Pick up contrast prior to appt. NPO 4 hours prior to test.  Tried to call pt, no answer, LMOVM for return call.

## 2019-09-23 NOTE — Telephone Encounter (Signed)
CT approved. Order ID: [id number], valid 09/23/19-03/20/20.

## 2019-09-23 NOTE — Telephone Encounter (Signed)
Routing to CM 

## 2019-09-23 NOTE — Telephone Encounter (Signed)
There are two notes open for this pt. This was addressed and documented in the other phone note.

## 2019-09-23 NOTE — Telephone Encounter (Signed)
Noted  

## 2019-09-25 ENCOUNTER — Telehealth: Payer: Self-pay | Admitting: Internal Medicine

## 2019-09-25 ENCOUNTER — Ambulatory Visit (HOSPITAL_COMMUNITY)
Admission: RE | Admit: 2019-09-25 | Discharge: 2019-09-25 | Disposition: A | Discharge status: Home / Self Care | Payer: BC Managed Care – PPO | Source: Ambulatory Visit | Attending: Gastroenterology | Admitting: Gastroenterology

## 2019-09-25 ENCOUNTER — Other Ambulatory Visit: Payer: Self-pay

## 2019-09-25 DIAGNOSIS — K6289 Other specified diseases of anus and rectum: Secondary | ICD-10-CM | POA: Insufficient documentation

## 2019-09-25 MED ORDER — IOHEXOL 300 MG/ML  SOLN
100.0000 mL | Freq: Once | INTRAMUSCULAR | Status: AC | PRN
  Administered 2019-09-25: 100 mL via INTRAVENOUS

## 2019-09-25 NOTE — Telephone Encounter (Signed)
Reviewed CT result. No findings to explain his rectal pain. No inflammation, wall thickening, or masses noted. Again, I suspect he has an anal fissure. He should continue applying the cream I prescribed QID, sitz baths, limit toilet time to 2-3 minutes.   How is his constipation? I could also send in Lidocaine ointment to see if this will help relieve some of his pain.   [facility] Clinical Pool: Any cancellations where patients TCS could be moved up?

## 2019-09-25 NOTE — Telephone Encounter (Signed)
[phone] patient called with some questions and would like to speak to the nurse

## 2019-09-25 NOTE — Telephone Encounter (Signed)
FYI Pt called with c/o rectal pain. Pt had his CT scan done this morning. He asked if our office would call with the results and pt is aware that we will call when results are available. Pt is having the same sensations (knifelike). Pt advised to keep applying the topical cream prescribed by [facility] and further direction results return. Pt states he will go to the ED if pain worsens.

## 2019-09-26 ENCOUNTER — Other Ambulatory Visit: Payer: Self-pay | Admitting: Gastroenterology

## 2019-09-26 DIAGNOSIS — K6289 Other specified diseases of anus and rectum: Secondary | ICD-10-CM

## 2019-09-26 MED ORDER — TRAMADOL HCL 50 MG PO TABS: 50.0000 mg | ORAL_TABLET | Freq: Four times a day (QID) | ORAL | 0 refills | Status: DC | PRN

## 2019-09-26 NOTE — Telephone Encounter (Signed)
Pt called back to see if [facility] responded to the message that was sent. Pt is aware that our office will contact him back when [facility] responds to message. Pt wants to be referred to a surgeon for eval of the fissure. Pt also wants to know if he needs an antibiotic or pain meds.

## 2019-09-26 NOTE — Telephone Encounter (Signed)
Currently do not have any cancellations

## 2019-09-26 NOTE — Telephone Encounter (Signed)
Lmom, waiting on a return call.  

## 2019-09-26 NOTE — Telephone Encounter (Signed)
Spoke with pt. Pt bought some otc Lidocaine yesterday and it gives him close to 2 hours of relief. Pt is taking Tylenol and was asked not to take Ibuprofen by his PCP. Pt isn't sure if there is a stronger Tylenol or pain medication he can take. Pt was advised to continue the topical cream given by [place] qid, sitz bath several times a day, limit toilet time to 2-3 mins. Pt states his bowels having been moving loosely this morning and he's had 6 BM's this morning. Pt asked are we going to fix the fissure. Per ME, there aren't any sooner apts for a TCS.

## 2019-09-26 NOTE — Telephone Encounter (Signed)
I am also sending in limited supply of tramadol to help with rectal pain. Will have [place] fax to patients pharmacy.

## 2019-09-26 NOTE — Telephone Encounter (Signed)
Spoke with pt and [place]. He admitted to taking Linzess because he felt he needed to have a BM. Earlier pt stated he had several bowel movements and when I called pt back he stated that he hasn't been able to have a BM and he took a Linzess. Pt is aware that Referral is going to be placed for possible anal fissure and we will try to move his apt up for his TCS. Spoke with [facility] about pain medication to help with the constant pain pt is having. Tramadol RX has been faxed to pts pharmacy per [facility].

## 2019-09-26 NOTE — Telephone Encounter (Signed)
I am ok with sending him to surgery for evaluation. I suspect he has an anal fissure that is not healing. Can't rule out other causes of rectal pain which is why he needs the colonoscopy.   What is he taking for Constipation? I had provided Amitiza 8 mcg samples at his last visit. He was supposed to let me know how this was working and if he felt this worked better than [place]. If he is taking Amitiza, he can decrease it to once a day.   [facility] Clinical Pool: Please place referral to surgery for possible non-healing anal fissure.   Camille: This patient really needs his TCS ASAP due to ongoing rectal pain. Is there anything that can be done to get his procedure moved up?

## 2019-09-28 ENCOUNTER — Ambulatory Visit (HOSPITAL_COMMUNITY)
Admission: EM | Admit: 2019-09-28 | Discharge: 2019-09-28 | Disposition: A | Discharge status: Home / Self Care | Payer: BC Managed Care – PPO | Attending: Emergency Medicine | Admitting: Emergency Medicine

## 2019-09-28 ENCOUNTER — Other Ambulatory Visit: Payer: Self-pay

## 2019-09-28 ENCOUNTER — Encounter (HOSPITAL_COMMUNITY): Payer: Self-pay

## 2019-09-28 DIAGNOSIS — K625 Hemorrhage of anus and rectum: Secondary | ICD-10-CM

## 2019-09-28 DIAGNOSIS — K6289 Other specified diseases of anus and rectum: Secondary | ICD-10-CM

## 2019-09-28 MED ORDER — LIDOCAINE (ANORECTAL) 5 % EX GEL: 1.0000 "application " | Freq: Three times a day (TID) | CUTANEOUS | 0 refills | Status: DC | PRN

## 2019-09-28 MED ORDER — HYDROCORTISONE ACETATE 25 MG RE SUPP: 25.0000 mg | Freq: Two times a day (BID) | RECTAL | 0 refills | Status: DC | PRN

## 2019-09-28 NOTE — ED Triage Notes (Signed)
Pt is here due to rectal pain from hemorrhoids, pt has been having issue with this for over some time now per pt. Pt also states that he has lost a lot of blood today.

## 2019-09-28 NOTE — ED Provider Notes (Signed)
[place]    CSN: [id number] Arrival date & time: 09/28/19  1248      History   Chief Complaint Chief Complaint  Patient presents with  . Hemorrhoids    HPI Steven [name] is a 69 y.o. male.   Steven [name] presents with complaints of persistent rectal pain as well as intermittent bleeding. He has been following with GI related to this. Was seen in the ER for this on 2/28. He was provided tramadol on 3/5 for his pain. He also has Linzess as well as Amitiza for constipation prevention. He states he also takes miralax and benefiber regularly. Approximately 4 days ago states he had loose stool following taking Linzess, passed approximately 6 bm's in a day. Since then has had a more difficult time passing stool. At time of bowel movement he feels nauseous, sweaty, light headed and like his heart rate goes up. States if he stands too long his rectum aches, he has been having to stay at home over the past two weeks due to discomfort. Today with his bowel movement he noted red blood with the stool, as well as BRB with wiping. No abdominal pain. Uses topical rectal cream prescribed from GI. Today after he had a bm he did use anusol suppository. Had a pelvic/abdominal CT on 3/4 which was normal. History of prostate cancer. He is scheduled to have colonoscopy 4/7, gi has been actively trying to reschedule this sooner. He was also referred to general surgery for evaluation, he is awaiting call for scheduling of this appointment.   Past Medical History:  Diagnosis Date  . Arthritis   . Atrial fibrillation ([place])   . Atrial flutter ([place])   . Cancer [facility])    prostate s/p radiation Mar 2013  . Chest pain   . Fracture 08/17/2015   MULTIPLE RIB FRACTURES     FROM FALL   . History of radiation therapy 08/22/11-10/13/11   prostate  . Hyperlipemia   . Hypertension     Patient Active Problem List   Diagnosis Date Noted  . Rectal pain 08/21/2019  . Nausea without vomiting 05/30/2019  .  Atrial flutter ([place]) 04/17/2019  . Atrial flutter, paroxysmal ([place]) 03/02/2019  . HTN (hypertension) 03/02/2019  . Elevated troponin 03/01/2019  . Hypokalemia 03/01/2019  . Constipation 08/02/2017  . Hemorrhoids 08/02/2017  . Fall 08/18/2015  . Open fracture of left elbow 08/18/2015  . Rib fractures 08/17/2015  . Radiation proctitis 01/30/2013  . Rectal bleeding 12/10/2012  . Chest pain 05/30/2012  . Elevated lipids 05/30/2012  . Palpitations 05/30/2012  . Dyspnea 05/30/2012  . Obese 05/30/2012  . Prostate cancer ([place]) 08/13/2011    Past Surgical History:  Procedure Laterality Date  . COLONOSCOPY N/A 12/19/2012   MOQ:HUTMLY 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 Normal appearing colon  . [place]  . RADIOACTIVE SEED IMPLANT     Prostate       Home Medications    Prior to Admission medications   Medication Sig Start Date End Date Taking? Authorizing Provider  amLODipine (NORVASC) 10 MG tablet Take 1 tablet (10 mg total) by mouth daily. 04/27/19   [name], MD  apixaban (ELIQUIS) 5 MG TABS tablet Take 1 tablet (5 mg total) by mouth 2 (two) times daily. 09/19/19   Josue Hector, MD  Cholecalciferol (VITAMIN D3) 125 MCG (5000 UT) CAPS Take 1 capsule by mouth daily.  [provider]  Cyanocobalamin (B-12 PO) Take 1 tablet by mouth daily.    [provider]  diazepam (VALIUM) 5 MG tablet Take 5 mg by mouth daily as needed. 05/13/19   [provider]  hydrocortisone (ANUSOL-HC) 2.5 % rectal cream Place 1 application rectally 4 (four) times daily. 08/19/19   Annitta Needs, NP  hydrocortisone (ANUSOL-HC) 25 MG suppository Place 1 suppository (25 mg total) rectally 2 (two) times daily as needed for hemorrhoids or anal itching. 09/28/19   Augusto Gamble B, NP  Lidocaine, Anorectal, 5 % GEL Apply 1 application topically 3 (three) times daily as needed (rectal pain). 09/28/19    Zigmund Gottron, NP  losartan (COZAAR) 100 MG tablet Take 1 tablet (100 mg total) by mouth daily. 04/19/19   Roxan Hockey, MD  metoprolol tartrate (LOPRESSOR) 25 MG tablet Take 1 tablet (25 mg total) by mouth 2 (two) times daily. 04/18/19   Emokpae, Courage, MD  Na Sulfate-K Sulfate-Mg Sulf (SUPREP BOWEL PREP KIT) 17.5-3.13-1.6 GM/177ML SOLN Take 1 kit by mouth as directed. 09/04/19   Rourk, Cristopher Estimable, MD  pantoprazole (PROTONIX) 40 MG tablet Take 1 tablet (40 mg total) by mouth daily. 30 minutes before breakfast 05/30/19   Annitta Needs, NP  potassium chloride SA (KLOR-CON) 20 MEQ tablet Take 1 tablet (20 mEq total) by mouth daily. 04/27/19   [name], MD  rosuvastatin (CRESTOR) 10 MG tablet Take 10 mg by mouth at bedtime. 08/22/19   [provider]  topiramate (TOPAMAX) 100 MG tablet Take 100 mg by mouth daily. 09/04/19   [provider]  traMADol (ULTRAM) 50 MG tablet Take 1 tablet (50 mg total) by mouth every 6 (six) hours as needed. 09/26/19 09/25/20  Erenest Rasher, PA-C  vitamin C (ASCORBIC ACID) 500 MG tablet Take 500 mg by mouth daily.    [provider]  VITAMIN E PO Take 1 tablet by mouth daily.    [provider]    Family History Family History  Problem Relation Age of Onset  . Aneurysm Mother   . Prostate cancer Father   . Colon cancer Neg Hx   . Colon polyps Neg Hx     Social History Social History   Tobacco Use  . Smoking status: Former Smoker    Packs/day: 1.00    Years: 20.00    Pack years: 20.00    Types: Cigarettes  . Smokeless tobacco: Former Systems developer  . Tobacco comment: Quit smoking x 2 years    07/2015   SOMETIIMES i USE VAPOR   Substance Use Topics  . Alcohol use: Not Currently    Comment: rarely  . Drug use: No     Allergies   Patient has no known allergies.   Review of Systems Review of Systems   Physical Exam Triage Vital Signs ED Triage Vitals  Enc Vitals Group     BP 09/28/19 1312 126/86      Pulse Rate 09/28/19 1312 71     Resp 09/28/19 1312 18     Temp 09/28/19 1312 98.4 F (36.9 C)     Temp Source 09/28/19 1312 Oral     SpO2 09/28/19 1312 100 %     Weight --      Height --      Head Circumference --      Peak Flow --      Pain Score 09/28/19 1313 10     Pain Loc --  Pain Edu? --      Excl. in [place]? --    No data found.  Updated Vital Signs BP 126/86 (BP Location: Left Arm)   Pulse 71   Temp 98.4 F (36.9 C) (Oral)   Resp 18   SpO2 100%    Physical Exam Constitutional:      General: He is not in acute distress.    Appearance: He is well-developed.     Comments: Uncomfortable appearing   Cardiovascular:     Rate and Rhythm: Normal rate.  Pulmonary:     Effort: Pulmonary effort is normal.  Abdominal:     Tenderness: There is no abdominal tenderness.  Genitourinary:    Rectum: Internal hemorrhoid present.     Comments: Much excess cream to rectum and buttocks with red rash noted and tenderness; no visible fissure on exam, much tenderness with DRE with likely palpable internal hemorrhoid; no external hemorrhoid present; no gross blood noted, no palpable stool Skin:    General: Skin is warm and dry.  Neurological:     Mental Status: He is alert and oriented to person, place, and time.      UC Treatments / Results  Labs (all labs ordered are listed, but only abnormal results are displayed) Labs Reviewed - No data to display  EKG   Radiology No results found.  Procedures Procedures (including critical care time)  Medications Ordered in UC Medications - No data to display  Initial Impression / Assessment and Plan / UC Course  I have reviewed the triage vital signs and the nursing notes.  Pertinent labs & imaging results that were available during my care of the patient were reviewed by me and considered in my medical decision making (see chart for details).     Persistent rectal pain and bleeding which is uncomfortable for Steven [name]. No red  flag findings here in urgent care. Sitz baths encouraged, continued supportive cares until colonoscopy for better visualization of rectum/ anus. Bowel habits may very well be contributing as it sound like he is waxing and waning between constipation and diarrhea. Return precautions provided. Encouraged continued management with GI and surgery as referred. Patient verbalized understanding and agreeable to plan. Ambulatory out of clinic without difficulty.     Final Clinical Impressions(s) / UC Diagnoses   Final diagnoses:  Rectal pain  Rectal bleeding     Discharge Instructions     Continue with previously prescribed regimen to promote regular bowel movements.  Drink plenty of water regularly as this helps with constipation as well.  Use of anusol suppository as needed if bleeding.  Topical lidocaine as needed for pain, as well as previously prescribed rectal creams. It does appear you now have a rash from too much moisture near the rectum, likely from the cream use, so do try to allow skin to dry in between use.  Please continue to follow up with your gastroenterologist for recheck and follow up.  Go to ER for any worsening of symptoms.     ED Prescriptions    Medication Sig Dispense Auth. Provider   hydrocortisone (ANUSOL-HC) 25 MG suppository Place 1 suppository (25 mg total) rectally 2 (two) times daily as needed for hemorrhoids or anal itching. 12 suppository Steven [name] B, NP   Lidocaine, Anorectal, 5 % GEL Apply 1 application topically 3 (three) times daily as needed (rectal pain). 113 g Zigmund Gottron, NP     PDMP not reviewed this encounter.   Zigmund Gottron, NP 09/28/19  1407  

## 2019-09-28 NOTE — Discharge Instructions (Signed)
Continue with previously prescribed regimen to promote regular bowel movements.  Drink plenty of water regularly as this helps with constipation as well.  Use of anusol suppository as needed if bleeding.  Topical lidocaine as needed for pain, as well as previously prescribed rectal creams. It does appear you now have a rash from too much moisture near the rectum, likely from the cream use, so do try to allow skin to dry in between use.  Please continue to follow up with your gastroenterologist for recheck and follow up.  Go to ER for any worsening of symptoms.

## 2019-09-29 ENCOUNTER — Telehealth: Payer: Self-pay | Admitting: Internal Medicine

## 2019-09-29 NOTE — Addendum Note (Signed)
Addended by: Cheron Every on: 09/29/2019 04:30 PM   Modules accepted: Orders

## 2019-09-29 NOTE — Telephone Encounter (Signed)
Routing message to [place]( surgery referral and CM. See KH's note.

## 2019-09-29 NOTE — Telephone Encounter (Signed)
Lmom, waiting on a return call.  

## 2019-09-29 NOTE — Telephone Encounter (Signed)
[phone] PATIENT NEEDS TO SPEAK TO A NURSE, HE IS SCHEDULED FOR UPCOMING PROCEDURE BUT STATES HE IS HAVING HEMORRHOIDS WITH BLEEDING,

## 2019-09-29 NOTE — Telephone Encounter (Signed)
Referral placed to surgery 

## 2019-09-29 NOTE — Telephone Encounter (Signed)
Pt returned call. Pt is having constipation today. Pt is straining and was advised not to. Pt states he has to get his BMs out. Pt took Miralax once daily and Amitiza as directed. Pt is aware that he can take Miralax twice daily if needed for constipation. Pt was advised not to strain and to come back to have a BM when his body is ready. Pt mentioned that he did see some blood and it's coming from straining. Pt is aware that the surgical referral was made and they will contact his soon. Continue the Amitiza bid as well and don't take any medication to stop the diarrhea if it starts. If symptoms worsen, pt should go to the [facility]. [place] is aware of this call.

## 2019-09-30 ENCOUNTER — Telehealth: Payer: Self-pay | Admitting: Internal Medicine

## 2019-09-30 ENCOUNTER — Other Ambulatory Visit: Payer: Self-pay

## 2019-09-30 DIAGNOSIS — K625 Hemorrhage of anus and rectum: Secondary | ICD-10-CM

## 2019-09-30 DIAGNOSIS — R42 Dizziness and giddiness: Secondary | ICD-10-CM

## 2019-09-30 LAB — CBC WITH DIFFERENTIAL/PLATELET
Absolute Monocytes: 475 cells/uL (ref 200–950)
Basophils Absolute: 48 cells/uL (ref 0–200)
Basophils Relative: 1.1 %
Eosinophils Absolute: 62 cells/uL (ref 15–500)
Eosinophils Relative: 1.4 %
HCT: 42.3 % (ref 38.5–50.0)
Hemoglobin: 14.4 g/dL (ref 13.2–17.1)
Lymphs Abs: 1844 cells/uL (ref 850–3900)
MCH: 29 pg (ref 27.0–33.0)
MCHC: 34 g/dL (ref 32.0–36.0)
MCV: 85.3 fL (ref 80.0–100.0)
MPV: 10.4 fL (ref 7.5–12.5)
Monocytes Relative: 10.8 %
Neutro Abs: 1971 cells/uL (ref 1500–7800)
Neutrophils Relative %: 44.8 %
Platelets: 265 10*3/uL (ref 140–400)
RBC: 4.96 10*6/uL (ref 4.20–5.80)
RDW: 12.5 % (ref 11.0–15.0)
Total Lymphocyte: 41.9 %
WBC: 4.4 10*3/uL (ref 3.8–10.8)

## 2019-09-30 NOTE — Telephone Encounter (Signed)
Noted. Spoke with Dr. Arnoldo Morale. States he or Dr. Constance Haw would be able to see patient on Thursday 10/02/19. Spoke with patient, and he is agreeable to seeing surgery on 3/11. Unfortunately, Dr. Arnoldo Morale' office is closed at this time. I will call their office back in the morning to arrange OV.   Dr. Gala Romney, [facility]

## 2019-09-30 NOTE — Telephone Encounter (Signed)
Spoke with [employer]. States patient has received 6 refills since January. Filled hemorrhoid cream compounded with nifedipine on 3/1 then filled hemorrhoid cream with nitroglycerine on 3/5. He should not be out of the cream already. He should only be using a small pea sized amount per his rectum 4 times daily.   Please verify which cream he is using and I will call in a refill. He should not be using both nifedipine and nitroglycerine cream at the same time.

## 2019-09-30 NOTE — Telephone Encounter (Signed)
Noted. Spoke with [place]. She is going to reach out to pt about his cream.

## 2019-09-30 NOTE — Telephone Encounter (Signed)
noted 

## 2019-09-30 NOTE — Telephone Encounter (Signed)
Spoke with pt about [facility] recommendations. Pt was at a neurology appointment and didn't go to the ED. Pt is aware that if he is lightheaded ect, he needs to go to the ED. Lab orders were placed for pt and he was advised to have them done at [facility] or [facility] if he decides to go to the ED.  Pt advised to d/c any straining.   [place] Pt would like a refill on topical cream sent to his pharmacy, pt states he's almost out of cream.

## 2019-09-30 NOTE — Telephone Encounter (Signed)
Reviewed

## 2019-09-30 NOTE — Telephone Encounter (Signed)
PATIENT CALLED AGAIN, FROM ANOTHER DOCTORS APPOINTMENT, STATING HE KNOWS SOMEONE CALLED HIM.  SAID HE DID NOT LISTEN TO A MESSAGE, BUT HE KNEW SOMEONE CALLED HIM

## 2019-09-30 NOTE — Telephone Encounter (Signed)
Routing to KH 

## 2019-09-30 NOTE — Telephone Encounter (Signed)
Pt returned call. Please contact pt. [phone]

## 2019-09-30 NOTE — Telephone Encounter (Signed)
Pt has called multiple times today. He said someone tried calling him while he was at the lab. Please call him at [phone]

## 2019-09-30 NOTE — Telephone Encounter (Signed)
See other phone notes.

## 2019-09-30 NOTE — Telephone Encounter (Signed)
Called pt. Advised of cancellation for Thursday. He states with all the pain he is in, he is not going to be able to prep for the procedure. He reports having BM's makes him feel like he is going to pass out. He states he gets dizzy, lightheaded. He reports he is still passing blood. He thought he was going to see the surgeon before he had procedure done. He reports when he passes gas he gets nauseated. He kept repeating he just doesn't think he can prep for that. He also states he has AFIB and wondering if he needs to go to ED. He reports he is having some swelling and feeling "hot". I spoke with AM and advised patient he needs to go to ED for evaluation.   Patient is requesting to also have rectal cream called in. Please advise Steven [name] thanks

## 2019-09-30 NOTE — Telephone Encounter (Signed)
Called patient to discuss below. He reports someone has already called and told him. He is going to have blood work done. I advised again that [facility] recommends TCS Thursday to further eval his pain. He reports he is at the doctors office right now and he will call back when he is done

## 2019-09-30 NOTE — Telephone Encounter (Signed)
Since I saw patient on 2/22, he was seen in the ED on 2/28 and Urgent care on 3/7 for the same symptoms. ED prescribed anusol suppositories and urgent care prescribed Anusol suppositories and Lidocaine 5% cream. He has had a CT that was unrevealing and I prescribed tramadol for ongoing pain on 09/26/19. I would encourage him to have TCS completed Thursday as we need direct visualization to determine the cause of his ongoing rectal pain. When I saw him in the office, he also reported feeling lightheaded when having to have a BM. He needs to be sure he isn't straining when having a BM or sitting on the toilet more than 2-3 minutes. If he is having significnat lightheadedness, dizziness, or feeling he will pass out, would recommend proceeding to the ED. We can update CBC to ensure no developing anemia. Please arrange.   Dr. Gala Romney, would you be ok with pursuing flex sig rather than TCS at this point as patient doesn't feel he can tolerate prep. Maybe circle back to TCS at a later date?

## 2019-09-30 NOTE — Telephone Encounter (Signed)
LMOVM for pt. Cancellation for thursday

## 2019-09-30 NOTE — Telephone Encounter (Signed)
Lab orders placed and faxed to [facility].

## 2019-09-30 NOTE — Telephone Encounter (Signed)
When I spoke with patient earlier, he admitted to using both nifedipine and nitroglycerine cream. Also using more than he should at one time. Suspect this could be contributing to some of his dizziness. He was advised to stop nitroglycerine compound and only use nifedipine compound. Also educated on how to apply the cream. I spoke with [place] and requested they completely discontinue the nitroglycerine cream and only refill the nifedipine cream.

## 2019-09-30 NOTE — Telephone Encounter (Signed)
Lmom, waiting on a return call.  

## 2019-09-30 NOTE — Telephone Encounter (Signed)
Noted  

## 2019-09-30 NOTE — Telephone Encounter (Signed)
Communication noted; recent OV reviewed.  Given his urgent sx, he most likely has a symptomatic anal fissure.  FS/TCS not going to really help and he would likely not tolerate. Needs to go to Dr. Aviva Signs for examination under general anesthesia and definitive treatment of an anal fissure as appropriat.  We can circle back around for a TCS after he see Dr. Arnoldo Morale.

## 2019-10-01 ENCOUNTER — Telehealth: Payer: Self-pay

## 2019-10-01 NOTE — Telephone Encounter (Signed)
Noted  

## 2019-10-01 NOTE — Telephone Encounter (Signed)
Spoke with Dr. Arnoldo Morale' office this morning. Stated they would make patient an appointment for Thursday 10/02/19 and call the patient.   Steven [name]

## 2019-10-01 NOTE — Telephone Encounter (Signed)
Patient called to confirm appointment. Address provided-arrive at 10 am to fill out paperwork.

## 2019-10-02 ENCOUNTER — Telehealth: Payer: Self-pay | Admitting: Internal Medicine

## 2019-10-02 ENCOUNTER — Encounter: Payer: Self-pay | Admitting: General Surgery

## 2019-10-02 ENCOUNTER — Ambulatory Visit (INDEPENDENT_AMBULATORY_CARE_PROVIDER_SITE_OTHER): Payer: BC Managed Care – PPO | Admitting: General Surgery

## 2019-10-02 ENCOUNTER — Other Ambulatory Visit: Payer: Self-pay

## 2019-10-02 VITALS — BP 150/82 | HR 78 | Temp 98.3°F | Resp 14 | Ht 66.0 in | Wt 193.0 lb

## 2019-10-02 DIAGNOSIS — K6289 Other specified diseases of anus and rectum: Secondary | ICD-10-CM

## 2019-10-02 NOTE — Telephone Encounter (Signed)
Fowarding to [facility]. Dr. Arnoldo Morale note is not done yet.

## 2019-10-02 NOTE — Telephone Encounter (Signed)
[phone] patient called and said that he needs to move his procedure up. Had an appointment with dr. Arnoldo Morale this morning and was told nothing could be done until he had his tcs.

## 2019-10-02 NOTE — Patient Instructions (Signed)
Proctalgia Fugax  Proctalgia fugax is a condition that involves short episodes of intense pain in the rectum. The rectum is the last part of the large intestine. The pain can last from seconds to minutes. Episodes often occur during the night and wake the person from sleep. This condition is not a sign of cancer, but your health care provider may want to rule out other conditions. What are the causes? The exact cause of this condition is not known. One possible cause may be spasm of the pelvic muscles or spasms of the lowest part of the large intestine. What are the signs or symptoms? The only symptom of this condition is rectal pain. The pain may:  Be intense or severe.  Last for only a few seconds, or it may last up to 30 minutes.  Occur at night and wake you up from sleep. How is this diagnosed? This condition may be diagnosed based on your medical history, a physical exam, and by ruling out other problems that could cause the pain. You may have various tests, such as:  Anoscopy. In this test, a lighted scope is put into the rectum to look for abnormalities.  Barium enema. In this test, X-rays are taken after a white, chalky substance called barium is put into the colon. The barium shows up well on the X-rays and makes it easier to see any problems.  Blood tests to rule out infections or other problems. How is this treated? There is no specific treatment for this condition. This condition may be managed with:  Medicines.  Warm baths.  Relaxation techniques, such as deep breathing exercises or gentle yoga.  Gentle massage of the painful area.  Biofeedback. Follow these instructions at home:   Take over-the-counter and prescription medicines only as told by your health care provider.  Follow instructions from your health care provider about eating or drinking restrictions.  Ask your health care provider what activities are safe for you.  Try warm baths, massaging the area,  or progressive relaxation techniques as told by your health care provider.  Keep all follow-up visits as told by your health care provider. This is important. Contact a health care provider if:  Your pain gets worse.  You develop new symptoms.  You have anal pain along with a fever. Get help right away if you:  Have blood in your stool or blood coming from the rectal area. Summary  Proctalgia fugax is a condition that involves short episodes of intense pain in the rectum. Episodes often occur during the night and wake the person from sleep.  The cause of this condition is not known.  Medicines, warm baths, massage, relaxation techniques, and biofeedback may help to manage the pain.  Get help right away if you have blood in your stool or blood coming from the rectal area. This information is not intended to replace advice given to you by your health care provider. Make sure you discuss any questions you have with your health care provider. Document Revised: 02/20/2018 Document Reviewed: 02/20/2018 Elsevier Patient Education  2020 [employer]  

## 2019-10-03 NOTE — Telephone Encounter (Signed)
Reviewed Dr. Adline Mango note. Stated he didn't appreciate an anal fissure on DRE although exam was limited due to pain. Recommended to proceed with having a colonoscopy by Dr. Gala Romney.  Should he continue to have proctalgia, referral to colorectal surgery for further evaluation and treatment may be needed.  He give him Rectiv samples to relieve the pain. Stated he may benefit from rectal diltiazem cream as needed.  I would recommend we go ahead and place referral to [facility] ASAP for rectal pain.   [facility] Clinical Pool: Please place referral to [facility] ASAP for rectal pain, likely needs exam under general anesthesia, unable to tolerate colon prep.

## 2019-10-03 NOTE — Progress Notes (Signed)
Steven [name]; [id number]; Dec 03, 1950   HPI Patient is a 69 year old black male who was referred to my care by Delman Cheadle and Dr. Gala Romney for evaluation treatment of rectal pain.  Patient states he has had rectal pain for many months.  He states his stool can come out narrowed.  He has to strain to move his bowels at times.  He does report some blood per rectum.  He states his rectal pain radiates throughout his body and causes him to have headaches.  He has tried various creams and suppositories.  He is on Eliquis for atrial fibrillation/flutter.  He states he notices blood on the toilet paper when he wipes himself.  He is very anxious about the rectal pain.  He is scheduled to have a colonoscopy soon by Dr. Gala Romney.  He currently denies a rectal pain. Past Medical History:  Diagnosis Date  . Arthritis   . Atrial fibrillation ([place])   . Atrial flutter ([place])   . Cancer [facility])    prostate s/p radiation Mar 2013  . Chest pain   . Fracture 08/17/2015   MULTIPLE RIB FRACTURES     FROM FALL   . History of radiation therapy 08/22/11-10/13/11   prostate  . Hyperlipemia   . Hypertension     Past Surgical History:  Procedure Laterality Date  . COLONOSCOPY N/A 12/19/2012   XBM:[id number] 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 Normal appearing colon  . [place]  . RADIOACTIVE SEED IMPLANT     Prostate    Family History  Problem Relation Age of Onset  . Aneurysm Mother   . Prostate cancer Father   . Colon cancer Neg Hx   . Colon polyps Neg Hx     Current Outpatient Medications on File Prior to Visit  Medication Sig Dispense Refill  . amLODipine (NORVASC) 10 MG tablet Take 1 tablet (10 mg total) by mouth daily. 30 tablet 6  . amLODipine (NORVASC) 5 MG tablet Take 5 mg by mouth daily.    Marland Kitchen apixaban (ELIQUIS) 5 MG TABS tablet Take 1 tablet (5 mg total) by mouth 2 (two) times daily. 60 tablet 5  . Cholecalciferol (VITAMIN  D3) 125 MCG (5000 UT) CAPS Take 1 capsule by mouth daily.    . Cyanocobalamin (B-12 PO) Take 1 tablet by mouth daily.    . diazepam (VALIUM) 5 MG tablet Take 5 mg by mouth daily as needed.    . hydrochlorothiazide (HYDRODIURIL) 25 MG tablet Take 25 mg by mouth daily.    . hydrocortisone (ANUSOL-HC) 2.5 % rectal cream Place 1 application rectally 4 (four) times daily. 30 g 2  . hydrocortisone (ANUSOL-HC) 25 MG suppository Place 1 suppository (25 mg total) rectally 2 (two) times daily as needed for hemorrhoids or anal itching. 12 suppository 0  . Lidocaine, Anorectal, 5 % GEL Apply 1 application topically 3 (three) times daily as needed (rectal pain). 113 g 0  . losartan (COZAAR) 100 MG tablet Take 1 tablet (100 mg total) by mouth daily. 30 tablet 4  . metoprolol tartrate (LOPRESSOR) 25 MG tablet Take 1 tablet (25 mg total) by mouth 2 (two) times daily. 60 tablet 2  . Na Sulfate-K Sulfate-Mg Sulf (SUPREP BOWEL PREP KIT) 17.5-3.13-1.6 GM/177ML SOLN Take 1 kit by mouth as directed. 354 mL 0  . pantoprazole (PROTONIX) 40 MG tablet Take 1 tablet (40 mg total) by mouth daily. 30 minutes before breakfast 30 tablet 3  .  polyethylene glycol powder (GLYCOLAX/MIRALAX) 17 GM/SCOOP powder Take by mouth.    . potassium chloride SA (KLOR-CON) 20 MEQ tablet Take 1 tablet (20 mEq total) by mouth daily. 30 tablet 0  . predniSONE (STERAPRED UNI-PAK 21 TAB) 10 MG (21) TBPK tablet See admin instructions. follow package directions    . rosuvastatin (CRESTOR) 10 MG tablet Take 10 mg by mouth at bedtime.    . topiramate (TOPAMAX) 100 MG tablet Take 100 mg by mouth daily.    Marland Kitchen topiramate (TOPAMAX) 25 MG tablet Take 100 mg by mouth 2 (two) times daily.    . traMADol (ULTRAM) 50 MG tablet Take 1 tablet (50 mg total) by mouth every 6 (six) hours as needed. 15 tablet 0  . vitamin C (ASCORBIC ACID) 500 MG tablet Take 500 mg by mouth daily.    Marland Kitchen VITAMIN E PO Take 1 tablet by mouth daily.     No current facility-administered  medications on file prior to visit.    No Known Allergies  Social History   Substance and Sexual Activity  Alcohol Use Not Currently   Comment: rarely    Social History   Tobacco Use  Smoking Status Former Smoker  . Packs/day: 1.00  . Years: 20.00  . Pack years: 20.00  . Types: Cigarettes  . Quit date: 2020  . Years since quitting: 1.1  Smokeless Tobacco Former Systems developer  Tobacco Comment   Quit smoking x 2 years    07/2015   SOMETIIMES i USE VAPOR     Review of Systems  Constitutional: Negative.   HENT: Negative.   Eyes: Negative.   Respiratory: Positive for shortness of breath.   Cardiovascular: Negative.   Gastrointestinal: Positive for abdominal pain.  Genitourinary: Negative.   Musculoskeletal: Negative.   Skin: Negative.   Neurological: Negative.   Endo/Heme/Allergies: Negative.   Psychiatric/Behavioral: The patient is nervous/anxious.     Objective   Vitals:   10/02/19 1024  BP: (!) 150/82  Pulse: 78  Resp: 14  Temp: 98.3 F (36.8 C)  SpO2: 94%    Physical Exam Vitals reviewed.  Constitutional:      Appearance: Normal appearance. He is not ill-appearing.  HENT:     Head: Normocephalic.  Cardiovascular:     Rate and Rhythm: Normal rate and regular rhythm.     Heart sounds: Normal heart sounds. No murmur. No friction rub. No gallop.   Pulmonary:     Effort: Pulmonary effort is normal. No respiratory distress.     Breath sounds: Normal breath sounds. No stridor. No wheezing, rhonchi or rales.  Genitourinary:    Comments: Digital rectal examination was limited secondary to patient pain.  I was able to use my fifth finger to examine the rectal vault.  I did not appreciate an anal fissure.  His sphincter tone was tight.  It did somewhat relax during the examination.  No external hemorrhoids are noted.  Difficult to a certain for internal hemorrhoids.  No blood was noted. Skin:    General: Skin is warm and dry.  Neurological:     Mental Status: He is  alert and oriented to person, place, and time.     Assessment  Rectal pain, proctalgia.  At this point, I do not have a surgical etiology for the patient's rectal pain. Plan   I told him that he should proceed with having a colonoscopy by Dr. Gala Romney.  Should he continue to have proctalgia, referral to colorectal surgery for further evaluation and treatment  may be needed.  I did give him Rectiv samples to relieve the pain.  May benefit from rectal diltiazem cream as needed.

## 2019-10-05 ENCOUNTER — Ambulatory Visit
Admission: EM | Admit: 2019-10-05 | Discharge: 2019-10-05 | Disposition: A | Discharge status: Home / Self Care | Payer: BC Managed Care – PPO

## 2019-10-05 ENCOUNTER — Other Ambulatory Visit: Payer: Self-pay

## 2019-10-05 ENCOUNTER — Encounter: Payer: Self-pay | Admitting: *Deleted

## 2019-10-05 DIAGNOSIS — K649 Unspecified hemorrhoids: Secondary | ICD-10-CM

## 2019-10-05 DIAGNOSIS — R110 Nausea: Secondary | ICD-10-CM

## 2019-10-05 HISTORY — DX: Unspecified hemorrhoids: K64.9

## 2019-10-05 MED ORDER — HYDROCORT-PRAMOXINE (PERIANAL) 1-1 % EX FOAM: 1.0000 | Freq: Two times a day (BID) | CUTANEOUS | 1 refills | Status: DC

## 2019-10-05 MED ORDER — DICYCLOMINE HCL 20 MG PO TABS: 20.0000 mg | ORAL_TABLET | Freq: Two times a day (BID) | ORAL | 0 refills | Status: DC

## 2019-10-05 MED ORDER — ONDANSETRON HCL 4 MG PO TABS: 4.0000 mg | ORAL_TABLET | Freq: Three times a day (TID) | ORAL | 0 refills | Status: DC | PRN

## 2019-10-05 NOTE — Discharge Instructions (Addendum)
Perform sitz water baths Eat a diet high if fiber and drink plenty of water Prescribed proctofoam.   Use medication as prescribed for symptomatic relief Follow up with PCP if symptoms persists Return or go to the ER if you have any new or worsening symptoms

## 2019-10-05 NOTE — ED Provider Notes (Signed)
[facility]    CSN: [id number] Arrival date & time: 10/05/19  1259      History   Chief Complaint Chief Complaint  Patient presents with  . Hemorrhoids    HPI Steven [name] is a 69 y.o. male.   History of IBS  presents to the urgent care with a  complaint of nausea and worsening hemorrhoid since February.  Denies a precipitating event, or specific injury.  Described hemorrhoid pain as burning.  Patient reports  he was having diarrhea with 5 stools yesterday. Denied bloody stool.  Has tried OTC medications without relief.  Symptoms are made worse with sitting.  Reports similar symptoms in the past that resolved with treatment.  Denies fever, chills, appetite change, weight change, chest pain changes in bowel or bladder habits.  The history is provided by the patient. No language interpreter was used.    Past Medical History:  Diagnosis Date  . Arthritis   . Atrial fibrillation ([place])   . Atrial flutter ([place])   . Cancer [facility])    prostate s/p radiation Mar 2013  . Chest pain   . Fracture 08/17/2015   MULTIPLE RIB FRACTURES     FROM FALL   . Hemorrhoids   . History of radiation therapy 08/22/11-10/13/11   prostate  . Hyperlipemia   . Hypertension     Patient Active Problem List   Diagnosis Date Noted  . Rectal pain 08/21/2019  . Nausea without vomiting 05/30/2019  . Atrial flutter ([place]) 04/17/2019  . Atrial flutter, paroxysmal ([place]) 03/02/2019  . HTN (hypertension) 03/02/2019  . Elevated troponin 03/01/2019  . Hypokalemia 03/01/2019  . Constipation 08/02/2017  . Hemorrhoids 08/02/2017  . Fall 08/18/2015  . Open fracture of left elbow 08/18/2015  . Rib fractures 08/17/2015  . Radiation proctitis 01/30/2013  . Rectal bleeding 12/10/2012  . Chest pain 05/30/2012  . Elevated lipids 05/30/2012  . Palpitations 05/30/2012  . Dyspnea 05/30/2012  . Obese 05/30/2012  . Prostate cancer ([place]) 08/13/2011    Past Surgical History:  Procedure Laterality Date   . COLONOSCOPY N/A 12/19/2012   OMB:TDHRCB 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 Normal appearing colon  . [place]  . RADIOACTIVE SEED IMPLANT     Prostate       Home Medications    Prior to Admission medications   Medication Sig Start Date End Date Taking? Authorizing Provider  amLODipine (NORVASC) 10 MG tablet Take 1 tablet (10 mg total) by mouth daily. 04/27/19  Yes Pollina, Gwenyth Allegra, MD  apixaban (ELIQUIS) 5 MG TABS tablet Take 1 tablet (5 mg total) by mouth 2 (two) times daily. 09/19/19  Yes Josue Hector, MD  Cholecalciferol (VITAMIN D3) 125 MCG (5000 UT) CAPS Take 1 capsule by mouth daily.   Yes [provider]  Cyanocobalamin (B-12 PO) Take 1 tablet by mouth daily.   Yes [provider]  diazepam (VALIUM) 5 MG tablet Take 5 mg by mouth daily as needed. 05/13/19  Yes [provider]  hydrochlorothiazide (HYDRODIURIL) 25 MG tablet Take 25 mg by mouth daily. 07/28/19  Yes [provider]  hydrocortisone (ANUSOL-HC) 2.5 % rectal cream Place 1 application rectally 4 (four) times daily. 08/19/19  Yes Annitta Needs, NP  hydrocortisone (ANUSOL-HC) 25 MG suppository Place 1 suppository (25 mg total) rectally 2 (two) times daily as needed for hemorrhoids or anal itching. 09/28/19  Yes Zigmund Gottron, NP  Lidocaine,  Anorectal, 5 % GEL Apply 1 application topically 3 (three) times daily as needed (rectal pain). 09/28/19  Yes Augusto Gamble B, NP  losartan (COZAAR) 100 MG tablet Take 1 tablet (100 mg total) by mouth daily. 04/19/19  Yes Emokpae, Courage, MD  metoprolol tartrate (LOPRESSOR) 25 MG tablet Take 1 tablet (25 mg total) by mouth 2 (two) times daily. 04/18/19  Yes Emokpae, Courage, MD  pantoprazole (PROTONIX) 40 MG tablet Take 1 tablet (40 mg total) by mouth daily. 30 minutes before breakfast 05/30/19  Yes Annitta Needs, NP  polyethylene glycol powder [facility]) 17  GM/SCOOP powder Take by mouth. 08/19/15  Yes [provider]  potassium chloride SA (KLOR-CON) 20 MEQ tablet Take 1 tablet (20 mEq total) by mouth daily. 04/27/19  Yes Pollina, Gwenyth Allegra, MD  predniSONE (STERAPRED UNI-PAK 21 TAB) 10 MG (21) TBPK tablet See admin instructions. follow package directions 09/30/19  Yes [provider]  rosuvastatin (CRESTOR) 10 MG tablet Take 10 mg by mouth at bedtime. 08/22/19  Yes [provider]  topiramate (TOPAMAX) 100 MG tablet Take 100 mg by mouth daily. 09/04/19  Yes [provider]  topiramate (TOPAMAX) 25 MG tablet Take 100 mg by mouth 2 (two) times daily. 07/09/19  Yes [provider]  traMADol (ULTRAM) 50 MG tablet Take 1 tablet (50 mg total) by mouth every 6 (six) hours as needed. 09/26/19 09/25/20 Yes Harper, Tivis Ringer, PA-C  UNKNOWN TO PATIENT Stool softener   Yes [provider]  vitamin C (ASCORBIC ACID) 500 MG tablet Take 500 mg by mouth daily.   Yes [provider]  VITAMIN E PO Take 1 tablet by mouth daily.   Yes [provider]  amLODipine (NORVASC) 5 MG tablet Take 5 mg by mouth daily. 07/06/19   [provider]  dicyclomine (BENTYL) 20 MG tablet Take 1 tablet (20 mg total) by mouth 2 (two) times daily. 10/05/19   Albert Hersch, Darrelyn Hillock, FNP  hydrocortisone-pramoxine (PROCTOFOAM-HC) rectal foam Place 1 applicator rectally 2 (two) times daily. 10/05/19   Sydell Prowell, Darrelyn Hillock, FNP  Na Sulfate-K Sulfate-Mg Sulf (SUPREP BOWEL PREP KIT) 17.5-3.13-1.6 GM/177ML SOLN Take 1 kit by mouth as directed. 09/04/19   Rourk, Cristopher Estimable, MD  ondansetron (ZOFRAN) 4 MG tablet Take 1 tablet (4 mg total) by mouth every 8 (eight) hours as needed for nausea or vomiting. 10/05/19   Jamisha Hoeschen, Darrelyn Hillock, FNP    Family History Family History  Problem Relation Age of Onset  . Aneurysm Mother   . Prostate cancer Father   . Colon cancer Neg Hx   . Colon polyps Neg Hx     Social History Social History    Tobacco Use  . Smoking status: Former Smoker    Packs/day: 1.00    Years: 20.00    Pack years: 20.00    Types: Cigarettes    Quit date: 2020    Years since quitting: 1.2  . Smokeless tobacco: Former Systems developer  . Tobacco comment: Quit smoking x 2 years    07/2015   SOMETIIMES i USE VAPOR   Substance Use Topics  . Alcohol use: Never  . Drug use: Never     Allergies   Patient has no known allergies.   Review of Systems Review of Systems  Constitutional: Negative.   Respiratory: Negative.   Cardiovascular: Negative.   Gastrointestinal: Positive for nausea.       Rectal pain  All other systems reviewed and are negative.    Physical  Exam Triage Vital Signs ED Triage Vitals  Enc Vitals Group     BP 10/05/19 1310 122/75     Pulse Rate 10/05/19 1310 65     Resp 10/05/19 1310 16     Temp 10/05/19 1310 98.4 F (36.9 C)     Temp Source 10/05/19 1310 Oral     SpO2 10/05/19 1310 98 %     Weight --      Height --      Head Circumference --      Peak Flow --      Pain Score 10/05/19 1318 4     Pain Loc --      Pain Edu? --      Excl. in [place]? --    No data found.  Updated Vital Signs BP 122/75 (BP Location: Right Arm)   Pulse 65   Temp 98.4 F (36.9 C) (Oral)   Resp 16   SpO2 98%   Visual Acuity Right Eye Distance:   Left Eye Distance:   Bilateral Distance:    Right Eye Near:   Left Eye Near:    Bilateral Near:     Physical Exam Vitals and nursing note reviewed.  Constitutional:      General: He is not in acute distress.    Appearance: Normal appearance. He is normal weight. He is not ill-appearing or toxic-appearing.  Cardiovascular:     Rate and Rhythm: Normal rate and regular rhythm.     Pulses: Normal pulses.     Heart sounds: Normal heart sounds. No murmur.  Pulmonary:     Effort: Pulmonary effort is normal. No respiratory distress.     Breath sounds: Normal breath sounds. No stridor. No wheezing, rhonchi or rales.  Chest:     Chest wall: No  tenderness.  Abdominal:     General: Abdomen is flat. Bowel sounds are normal. There is no distension.     Palpations: There is no mass.     Tenderness: There is no abdominal tenderness. There is no right CVA tenderness, left CVA tenderness, guarding or rebound.     Hernia: No hernia is present.  Genitourinary:    Comments: Rectum: few hemorrhoids present Neurological:     Mental Status: He is alert.      UC Treatments / Results  Labs (all labs ordered are listed, but only abnormal results are displayed) Labs Reviewed - No data to display  EKG   Radiology No results found.  Procedures Procedures (including critical care time)  Medications Ordered in UC Medications - No data to display  Initial Impression / Assessment and Plan / UC Course  I have reviewed the triage vital signs and the nursing notes.  Pertinent labs & imaging results that were available during my care of the patient were reviewed by me and considered in my medical decision making (see chart for details).     Patient stable at discharge. Bentyl was prescribed for IBS Zofran was prescribed for nausea Proctofoam was prescribed for hemorrhoid Advised patient to follow-up with primary care to be referred to see GI Return for worsening of symptoms   Final Clinical Impressions(s) / UC Diagnoses   Final diagnoses:  Nausea without vomiting  Hemorrhoids, unspecified hemorrhoid type     Discharge Instructions     Perform sitz water baths Eat a diet high if fiber and drink plenty of water Prescribed proctofoam.   Use medication as prescribed for symptomatic relief Follow up with PCP if symptoms persists  Return or go to the ER if you have any new or worsening symptoms     ED Prescriptions    Medication Sig Dispense Auth. Provider   hydrocortisone-pramoxine [facility]) rectal foam Place 1 applicator rectally 2 (two) times daily. 10 g ,  S, FNP   ondansetron (ZOFRAN) 4 MG tablet  Take 1 tablet (4 mg total) by mouth every 8 (eight) hours as needed for nausea or vomiting. 20 tablet , Darrelyn Hillock, FNP   dicyclomine (BENTYL) 20 MG tablet Take 1 tablet (20 mg total) by mouth 2 (two) times daily. 20 tablet , Darrelyn Hillock, FNP     PDMP not reviewed this encounter.   Emerson Monte, FNP 10/05/19 1356

## 2019-10-05 NOTE — ED Triage Notes (Signed)
Pt c/o having hemorroids "for a while" with bleeding "off and on".  States under care of physician for same and may have to have surgery.  Reports hemorrhoids getting "more severe since Feb".

## 2019-10-06 ENCOUNTER — Telehealth: Payer: Self-pay

## 2019-10-06 NOTE — Telephone Encounter (Signed)
Reviewed urgent care note. Stated patient was having diarrhea, hemorrhoid related pain and nausea. Was prescribed proctofoam, bentyl, and zofran.   He should not take bentyl as he primarily has constipation. This medication will worsen constipation.  Ok to hold Amitiza as this was causing nausea. Continue  MiraLAX 1 capful in 8 ounces daily and may try increasing to twice daily.  He may also try Colace daily if he would like.  Okay to continue prune juice.  We can discuss hemorrhoid treatment when we see him back in the office. He may have internal hemorrhoids. I did not see any external hemorrhoids on exam when I saw him in office. However, his symptoms of rectal pain are not consistent with hemorrhoids.  After his colonoscopy, we will be able to determine the type and severity of hemorrhoids if any are present.  We do not perform laser therapy; we offer rubber band ligation. We do not surgically remove any hemorrhoids in this office. That is through surgery.

## 2019-10-06 NOTE — Telephone Encounter (Signed)
Referral sent to [facility] via [facility].  Cyril Mourning, do we need to cancel TCS scheduled for 10/23/19?

## 2019-10-06 NOTE — Telephone Encounter (Signed)
Lmom, waiting on a return call.  

## 2019-10-06 NOTE — Telephone Encounter (Signed)
Noted  

## 2019-10-06 NOTE — Telephone Encounter (Signed)
Pt called and states he found out what was causing his nausea and making him feel bad. Pt states the Amitiza was cuasing sever nausea and he d/c it on Saturday. Pt states it makes him stay in the bed. Pt went to urgent care yesterday to have a doctor look at him. Pts hemorrhoids seemed like they were getting better but the constipation started again. Pt would like to know if he has internal or external hemorrhoids. Pt said the urgent care doctor felt he has more internal hemorrhoids. Pt would like to discuss having laser treatments so he doesn't have to get his hemorrhoids cut? Pt would also like to know if he can take otc colace 100 mg since he can't take the Amitiza. Pt is currently taking Miralax and prune juice.  Pt had a BM yesterday and he has continued to have a BM. Pt states the BM comes out in small portions and he has to push to get it out. Pt switched to Preparation H and feels that it helps some. Pt feels the other creams prescribed haven't helped much.

## 2019-10-06 NOTE — Addendum Note (Signed)
Addended by: Hassan Rowan on: 10/06/2019 08:08 AM   Modules accepted: Orders

## 2019-10-06 NOTE — Telephone Encounter (Signed)
Spoke with pt. Pt notified of [facility] recommendations. Pt was advised to d/c the Dicyclomine if he is taking it so it doesn't cause constipation. Pt will hold Amitiza due to the nausea. Pt is aware that it's ok to continue Mrialax qd-bid, prune juice and add colace 100 mg if wanted.

## 2019-10-06 NOTE — Telephone Encounter (Signed)
We will leave him scheduled for now.Dr. Arnoldo Morale started him on Rectiv. Not sure whether this will help his rectal pain enough for him to tolerate the prep. Would like to give it some time. Went ahead and placed surgical referral as I did not want to put this off any longer in the instance his rectal pain doesn't improve.

## 2019-10-07 ENCOUNTER — Telehealth: Payer: Self-pay | Admitting: Internal Medicine

## 2019-10-07 NOTE — Telephone Encounter (Signed)
Pt called asking to speak with the nurse. I told him she was with patients and would have to call him back. He said the medication was making him nauseated [phone]

## 2019-10-07 NOTE — Telephone Encounter (Signed)
Noted  

## 2019-10-07 NOTE — Telephone Encounter (Signed)
Spoke with pt. Pt feels the Colace is causing nausea. Pt states he took it before he went to be and woke up around 5 am with nausea. Pt is taking Miralax and prune just. Pt would like to know if there are any milder medications otc that help with constipation.

## 2019-10-07 NOTE — Telephone Encounter (Signed)
Spoke with patient. No nausea at this time. He is only taking MiraLAX once a day and prune juice as needed. Had 2 good BMs yesterday and feels like he can have a BM now. Advised he discontinue colace and increase MiraLAX to twice a day. Once in the morning and once in the evening. He was prescribed Zofran on 10/05/19 when he was seen at urgent care. Advised he use this as needed for nausea.   Candace Cruise

## 2019-10-08 ENCOUNTER — Telehealth: Payer: Self-pay | Admitting: *Deleted

## 2019-10-08 ENCOUNTER — Telehealth: Payer: Self-pay

## 2019-10-08 NOTE — Telephone Encounter (Signed)
[place] at [facility] called office, she spoke to pt and scheduled appt 10/28/19. 10/28/19 is 1st available and she added him to cancellation list. She also sent a message to the nurse since he is in so much pain. Pt wants to know about TCS scheduled for 10/23/19.  Cyril Mourning, do you want TCS to be placed on hold for now until he can be seen by surgery?

## 2019-10-08 NOTE — Telephone Encounter (Signed)
Yes, we can place TCS on hold for now and follow-up after evaluation with surgery.

## 2019-10-08 NOTE — Telephone Encounter (Signed)
Patient called in requesting to speak with [facility]. He wanted to discuss his referral to [facility]. I advised patient we spoke with them today and his records are in review. They will call him to schedule an appt. Nothing further needed

## 2019-10-09 ENCOUNTER — Telehealth: Payer: Self-pay | Admitting: *Deleted

## 2019-10-09 NOTE — Telephone Encounter (Signed)
Pt called in and wants to know if he can use suppositories for rectal bleeding.  He says that he is wiping blood after bm's.  Pt is aware that [facility] has left for the day so it may be tomorrow before we can call with recommendations.  Pt voiced understanding.

## 2019-10-09 NOTE — Telephone Encounter (Signed)
Pt called office, informed him TCS for 10/23/19 needs to be cancelled for now until he is evaluated by surgery. LMOVM for endo scheduler.

## 2019-10-09 NOTE — Telephone Encounter (Signed)
Tried to call pt, no answer, LMOVM for return call.  

## 2019-10-10 ENCOUNTER — Other Ambulatory Visit: Payer: Self-pay | Admitting: Gastroenterology

## 2019-10-10 DIAGNOSIS — K625 Hemorrhage of anus and rectum: Secondary | ICD-10-CM

## 2019-10-10 DIAGNOSIS — K649 Unspecified hemorrhoids: Secondary | ICD-10-CM

## 2019-10-10 MED ORDER — HYDROCORTISONE ACETATE 25 MG RE SUPP: 25.0000 mg | Freq: Two times a day (BID) | RECTAL | 1 refills | Status: DC | PRN

## 2019-10-10 NOTE — Telephone Encounter (Signed)
Called pt and informed him to continue using suppositories as needed.  He is aware that he can use them up to twice a day.  Pt is aware that RX has been sent.  Pt voiced understanding.

## 2019-10-10 NOTE — Telephone Encounter (Signed)
Looks like he was prescribed Anusol suppositories when he was seen in urgent care on 09/28/2019.  Has he used the suppositories?  Did he feel they helped?  If they felt they helped, I would be happy to send in a refill if needed.

## 2019-10-10 NOTE — Telephone Encounter (Signed)
Pt said that the suppositories seem to cause more bm's.  He wants to know if they help the bleeding.  He took one yesterday but  bm's yet.  Can we advise what to do to help with rectal bleeding?  Pt says that he is running low on suppositories so he would like a refill.  [facility].

## 2019-10-10 NOTE — Telephone Encounter (Signed)
Ok to continue using suppositories as needed. May use up to twice a day. I will send in a refill.

## 2019-10-13 NOTE — Telephone Encounter (Signed)
Pt called this morning 10/13/19. Pt called with c/o fissure. Pt states he didn't have any rest last night. Pt is using otc preparation H and feel it helps the best. Pt asked is there any other creams that can be called in. Pt is aware that several creams have have been called in for him. Pt was advised to try the cream [facility] called in for him recently. Pt states that he is using the suppositories that were called in. Pt is also asking for pain medication. [place] called in Tramadol and pts concerns were constipation. Pt is aware that any pain medication can cause constipation. Pt is aware that [facility] isn't in the office today.

## 2019-10-13 NOTE — Telephone Encounter (Signed)
Lmom, waiting on a return call.  

## 2019-10-13 NOTE — Telephone Encounter (Signed)
Use Tramadol prn as prescribed. Make sure to take stool softeners twice a day (morning and evening) if tolerated (previously had noted nausea with this). Can get this over the counter. As Steven [name] has said previously, take Miralax twice a day. We want to keep stools soft, avoid constipation, no straining. Limit toilet time to 2-3 minutes.

## 2019-10-13 NOTE — Telephone Encounter (Signed)
Communication noted. Agree with AB recommendations.

## 2019-10-13 NOTE — Telephone Encounter (Signed)
Patient called back. I made him aware of AB recs below. He states we had advised him of this before. I advised again of the recs per AB. Nothing further needed

## 2019-10-15 ENCOUNTER — Telehealth: Payer: Self-pay

## 2019-10-15 NOTE — Telephone Encounter (Signed)
Pt called. Pt went to [facility] and states the doctor didn't know why the pt was here. Pt isn't sure what needs to be done. The surgeon prescribed a topical rectal cream for pt to use. Pt called CP and it's the same cream that the pt was previously used. Per pt the surgeon wants him to use this topical cream for several weeks and f/u. Pt isn't sure what the next step is.   Manuela Schwartz, can you request records from [facility].

## 2019-10-16 ENCOUNTER — Other Ambulatory Visit: Payer: Self-pay | Admitting: *Deleted

## 2019-10-16 ENCOUNTER — Telehealth: Payer: Self-pay | Admitting: *Deleted

## 2019-10-16 NOTE — Telephone Encounter (Signed)
Medication Adjustments for Procedure: Hold Eliquis x 48 hours prior to procedure.

## 2019-10-16 NOTE — Telephone Encounter (Signed)
Communication noted.  Cyril Mourning, may be in this case, it would be best to get the hospitalist to help prep him overnight?

## 2019-10-16 NOTE — Telephone Encounter (Signed)
Patient called back. Agreeable to being admitted for TCS prep. Aware he needs to hold eliquis starting 3/29 and he will need to start clear liquids after lunch 3/29. Advised will leave instruction letter for pick up at front desk. Also handed phone to daughter and informed her as well.   FYI to [facility] as reminder to call hospitalist monday

## 2019-10-16 NOTE — Telephone Encounter (Signed)
Lmovm for pt make aware of plan

## 2019-10-16 NOTE — Telephone Encounter (Signed)
Yes, I agree with hospital admission for prep overnight due to patient's significant concerns about possibly passing out at home during prep.  I have discussed this with Mindy.  We will need to discuss with hospitalist on Monday about admission on Tuesday.  Sending FYI to Roseanne Kaufman, NP as she is in the hospital on Monday.

## 2019-10-16 NOTE — Telephone Encounter (Signed)
[facility] faxed records yesterday and placed in [facility] box.

## 2019-10-16 NOTE — Telephone Encounter (Signed)
Noted  

## 2019-10-16 NOTE — Telephone Encounter (Signed)
FYI to [facility] and AM. I have called pt and LMOVM to schedule TCS

## 2019-10-16 NOTE — Telephone Encounter (Signed)
Patient called back. Explained to him Dr. Dema Severin and Dr. Gala Romney requests TCS to be done to ensure nothing else was going on. Patient keep repeating he didn't think he could do this. I advised patient to try his best to have this done. He is scheduled for 10/22/2019 at 2:45pm. Patient aware will need pre-op/covid test done. I advised will call him with this appt. I advised will resend rx to pharmacy and fax instructions to them. Patient kept repeating he doesn't think he can prep bc he almost passes out while having BM. I advised will inform Dr. Gala Romney and [facility].   Also he states he is not on xarelto anymore he is now on Eliquis. Please advise regarding holding instructions. Thanks

## 2019-10-16 NOTE — Telephone Encounter (Signed)
Patient left a VM to call as he had questions.   Called pt. He wanted to if he needed surgery while admitted who would do that. I advised pt that he is ONLY being admitted for TCS prep. He is having a colonoscopy done. He voiced understanding. He wanted to make sure no one was going to "cut" on him. I reiterated to patient he starts clear liquids Monday after lunch and hold eliquis as well. He will be called from admitting on Tuesday when his bed is ready for admission. He just kept repeating "right".

## 2019-10-16 NOTE — Telephone Encounter (Signed)
Per RMR patient needs ASAP TCS with propofol  Called pt and LMOVM

## 2019-10-16 NOTE — Telephone Encounter (Signed)
-----   Message from Daneil Dolin, MD sent at 10/15/2019  8:34 PM EDT ----- Regarding: RE: Surveillance colonoscopy Hello Gerald Stabs:  Thanks for seeing him.  Yes, will get him back on the schedule for a diagnostic colonoscopy ASAP.  Trini Soldo, he'll need propofol.  Milton Ferguson ----- Message ----- From: Ileana Roup, MD Sent: 10/15/2019   5:01 PM EDT To: Daneil Dolin, MD Subject: Surveillance colonoscopy                       Hey Dr. Gala Romney - Annye English with [facility]. We have a mutual patient I wanted to see if you'd consider proceeding with colonoscopy on. He appears to have an anterior midline anal fissure which we are working on treating. I saw his history including radiation proctitis (2/2 prostate ca) for which he has had APC 2014. I believe he was scheduled for colonoscopy 4/1 but this was cancelled. Given his symptoms and history,  I think it would be reasonable and helpful to repeat to ensure nothing else is going on. Let me know what you think. I appreciate your help and referral.  Gerald Stabs

## 2019-10-17 ENCOUNTER — Telehealth: Payer: Self-pay | Admitting: Internal Medicine

## 2019-10-17 NOTE — Telephone Encounter (Signed)
Reviewed [facility] OV note. Reports there is evidence of anterior midline anal fissure. Plans for nifedipine QID x 6-8 weeks- at least 4 weeks past resolution of symptoms. Discontinue all foreign bodies per anus. Advised to pursue TCS to ensure nothing else was going on. Plan to follow- up in 2 months or sooner if necessary.   Dr. Dema Severin communicated with Dr. Gala Romney. See other telephone notes. Patient has been scheduled for a colonoscopy with propofol on  10/22/19 with inpatient prep. See other telephone notes for documentation.   No further recommendations at this time.

## 2019-10-17 NOTE — Telephone Encounter (Signed)
Lmom, waiting on a return call.  

## 2019-10-17 NOTE — Telephone Encounter (Signed)
Patient has called again today asking for the nurse. He said he can't use the bathroom and doesn't know why. Please call him back. [phone]

## 2019-10-20 ENCOUNTER — Encounter (HOSPITAL_COMMUNITY): Payer: BC Managed Care – PPO

## 2019-10-20 ENCOUNTER — Other Ambulatory Visit (HOSPITAL_COMMUNITY): Payer: BC Managed Care – PPO

## 2019-10-20 ENCOUNTER — Telehealth: Payer: Self-pay

## 2019-10-20 NOTE — Telephone Encounter (Signed)
Noted  

## 2019-10-20 NOTE — Telephone Encounter (Signed)
Pt called to ask what things are safe to eat and drink at noon today. Went over instruction sheet with pt.

## 2019-10-20 NOTE — Telephone Encounter (Signed)
Called bed placement. They will call patient tomorrow once bed is available for admission.

## 2019-10-20 NOTE — Telephone Encounter (Signed)
Called patient and reminded him that the hospital will be calling in the AM when his bed is ready for admission.

## 2019-10-20 NOTE — Telephone Encounter (Signed)
I spoke with Dr. Roxan Hockey regarding patient's pending admission.

## 2019-10-21 ENCOUNTER — Encounter (HOSPITAL_COMMUNITY): Payer: Self-pay | Admitting: Family Medicine

## 2019-10-21 ENCOUNTER — Observation Stay (HOSPITAL_COMMUNITY)
Admission: RE | Admit: 2019-10-21 | Discharge: 2019-10-23 | Disposition: A | Discharge status: Home / Self Care | Payer: BC Managed Care – PPO | Source: Ambulatory Visit | Attending: Family Medicine | Admitting: Family Medicine

## 2019-10-21 ENCOUNTER — Other Ambulatory Visit: Payer: Self-pay

## 2019-10-21 DIAGNOSIS — C61 Malignant neoplasm of prostate: Secondary | ICD-10-CM | POA: Diagnosis present

## 2019-10-21 DIAGNOSIS — E0590 Thyrotoxicosis, unspecified without thyrotoxic crisis or storm: Secondary | ICD-10-CM

## 2019-10-21 DIAGNOSIS — K5900 Constipation, unspecified: Secondary | ICD-10-CM | POA: Diagnosis present

## 2019-10-21 DIAGNOSIS — K581 Irritable bowel syndrome with constipation: Secondary | ICD-10-CM | POA: Insufficient documentation

## 2019-10-21 DIAGNOSIS — I480 Paroxysmal atrial fibrillation: Secondary | ICD-10-CM | POA: Insufficient documentation

## 2019-10-21 DIAGNOSIS — E785 Hyperlipidemia, unspecified: Secondary | ICD-10-CM | POA: Diagnosis not present

## 2019-10-21 DIAGNOSIS — I4892 Unspecified atrial flutter: Secondary | ICD-10-CM | POA: Diagnosis not present

## 2019-10-21 DIAGNOSIS — R42 Dizziness and giddiness: Secondary | ICD-10-CM | POA: Insufficient documentation

## 2019-10-21 DIAGNOSIS — K627 Radiation proctitis: Secondary | ICD-10-CM | POA: Diagnosis present

## 2019-10-21 DIAGNOSIS — K921 Melena: Principal | ICD-10-CM | POA: Insufficient documentation

## 2019-10-21 DIAGNOSIS — M1990 Unspecified osteoarthritis, unspecified site: Secondary | ICD-10-CM | POA: Insufficient documentation

## 2019-10-21 DIAGNOSIS — K641 Second degree hemorrhoids: Secondary | ICD-10-CM | POA: Insufficient documentation

## 2019-10-21 DIAGNOSIS — K625 Hemorrhage of anus and rectum: Secondary | ICD-10-CM

## 2019-10-21 DIAGNOSIS — Z8546 Personal history of malignant neoplasm of prostate: Secondary | ICD-10-CM | POA: Diagnosis not present

## 2019-10-21 DIAGNOSIS — Z79899 Other long term (current) drug therapy: Secondary | ICD-10-CM | POA: Insufficient documentation

## 2019-10-21 DIAGNOSIS — I482 Chronic atrial fibrillation, unspecified: Secondary | ICD-10-CM | POA: Diagnosis present

## 2019-10-21 DIAGNOSIS — K6289 Other specified diseases of anus and rectum: Secondary | ICD-10-CM | POA: Diagnosis present

## 2019-10-21 DIAGNOSIS — Z87891 Personal history of nicotine dependence: Secondary | ICD-10-CM | POA: Diagnosis not present

## 2019-10-21 DIAGNOSIS — K644 Residual hemorrhoidal skin tags: Secondary | ICD-10-CM | POA: Diagnosis not present

## 2019-10-21 DIAGNOSIS — Z20822 Contact with and (suspected) exposure to covid-19: Secondary | ICD-10-CM | POA: Diagnosis not present

## 2019-10-21 DIAGNOSIS — R634 Abnormal weight loss: Secondary | ICD-10-CM | POA: Diagnosis not present

## 2019-10-21 DIAGNOSIS — Z6829 Body mass index (BMI) 29.0-29.9, adult: Secondary | ICD-10-CM | POA: Diagnosis not present

## 2019-10-21 DIAGNOSIS — I10 Essential (primary) hypertension: Secondary | ICD-10-CM | POA: Diagnosis present

## 2019-10-21 DIAGNOSIS — Z7901 Long term (current) use of anticoagulants: Secondary | ICD-10-CM | POA: Diagnosis not present

## 2019-10-21 DIAGNOSIS — K5520 Angiodysplasia of colon without hemorrhage: Secondary | ICD-10-CM | POA: Diagnosis not present

## 2019-10-21 DIAGNOSIS — K589 Irritable bowel syndrome without diarrhea: Secondary | ICD-10-CM | POA: Diagnosis present

## 2019-10-21 DIAGNOSIS — Z923 Personal history of irradiation: Secondary | ICD-10-CM | POA: Diagnosis not present

## 2019-10-21 DIAGNOSIS — K649 Unspecified hemorrhoids: Secondary | ICD-10-CM | POA: Diagnosis present

## 2019-10-21 LAB — SARS CORONAVIRUS 2 (TAT 6-24 HRS): SARS Coronavirus 2: NEGATIVE

## 2019-10-21 MED ORDER — PANTOPRAZOLE SODIUM 40 MG PO TBEC
40.0000 mg | DELAYED_RELEASE_TABLET | Freq: Every day | ORAL | Status: DC
  Administered 2019-10-21 – 2019-10-22 (×2): 40 mg via ORAL
  Filled 2019-10-21 (×2): qty 1

## 2019-10-21 MED ORDER — AMLODIPINE BESYLATE 5 MG PO TABS
5.0000 mg | ORAL_TABLET | Freq: Every day | ORAL | Status: DC
  Administered 2019-10-22 – 2019-10-23 (×2): 5 mg via ORAL
  Filled 2019-10-21 (×2): qty 1

## 2019-10-21 MED ORDER — SODIUM CHLORIDE 0.9% FLUSH: 3.0000 mL | INTRAVENOUS | Status: DC | PRN

## 2019-10-21 MED ORDER — ACETAMINOPHEN 325 MG PO TABS
650.0000 mg | ORAL_TABLET | Freq: Four times a day (QID) | ORAL | Status: DC | PRN
  Administered 2019-10-22 – 2019-10-23 (×4): 650 mg via ORAL
  Filled 2019-10-21 (×4): qty 2

## 2019-10-21 MED ORDER — POLYETHYLENE GLYCOL 3350 17 G PO PACK: 17.0000 g | PACK | Freq: Every day | ORAL | Status: DC | PRN

## 2019-10-21 MED ORDER — METOPROLOL TARTRATE 25 MG PO TABS
25.0000 mg | ORAL_TABLET | Freq: Two times a day (BID) | ORAL | Status: DC
  Administered 2019-10-21 – 2019-10-22 (×3): 25 mg via ORAL
  Filled 2019-10-21 (×4): qty 1

## 2019-10-21 MED ORDER — SODIUM CHLORIDE 0.9% FLUSH
3.0000 mL | Freq: Two times a day (BID) | INTRAVENOUS | Status: DC
  Administered 2019-10-21 – 2019-10-23 (×3): 3 mL via INTRAVENOUS

## 2019-10-21 MED ORDER — PEG 3350-KCL-NA BICARB-NACL 420 G PO SOLR
4000.0000 mL | Freq: Once | ORAL | Status: DC
  Administered 2019-10-21: 4000 mL via ORAL

## 2019-10-21 MED ORDER — ROSUVASTATIN CALCIUM 10 MG PO TABS
10.0000 mg | ORAL_TABLET | Freq: Every day | ORAL | Status: DC
  Administered 2019-10-22: 10 mg via ORAL
  Filled 2019-10-21: qty 1

## 2019-10-21 MED ORDER — TRAZODONE HCL 50 MG PO TABS
50.0000 mg | ORAL_TABLET | Freq: Every evening | ORAL | Status: DC | PRN
  Administered 2019-10-22: 50 mg via ORAL
  Filled 2019-10-21 (×2): qty 1

## 2019-10-21 MED ORDER — MORPHINE SULFATE (PF) 2 MG/ML IV SOLN
2.0000 mg | INTRAVENOUS | Status: DC | PRN
  Administered 2019-10-22: 2 mg via INTRAVENOUS
  Filled 2019-10-21: qty 1

## 2019-10-21 MED ORDER — ONDANSETRON HCL 4 MG/2ML IJ SOLN
4.0000 mg | Freq: Four times a day (QID) | INTRAMUSCULAR | Status: DC | PRN
  Administered 2019-10-22: 4 mg via INTRAVENOUS
  Filled 2019-10-21: qty 2

## 2019-10-21 MED ORDER — ONDANSETRON HCL 4 MG PO TABS: 4.0000 mg | ORAL_TABLET | Freq: Four times a day (QID) | ORAL | Status: DC | PRN

## 2019-10-21 MED ORDER — ACETAMINOPHEN 650 MG RE SUPP: 650.0000 mg | Freq: Four times a day (QID) | RECTAL | Status: DC | PRN

## 2019-10-21 MED ORDER — PEG 3350-KCL-NA BICARB-NACL 420 G PO SOLR: 4000.0000 mL | Freq: Once | ORAL | Status: DC

## 2019-10-21 MED ORDER — ALBUTEROL SULFATE (2.5 MG/3ML) 0.083% IN NEBU: 2.5000 mg | INHALATION_SOLUTION | RESPIRATORY_TRACT | Status: DC | PRN

## 2019-10-21 MED ORDER — SODIUM CHLORIDE 0.9 % IV SOLN
250.0000 mL | INTRAVENOUS | Status: DC | PRN
  Administered 2019-10-22: 250 mL via INTRAVENOUS

## 2019-10-21 MED ORDER — TOPIRAMATE 100 MG PO TABS
100.0000 mg | ORAL_TABLET | Freq: Two times a day (BID) | ORAL | Status: DC
  Administered 2019-10-21: 100 mg via ORAL
  Filled 2019-10-21 (×2): qty 1

## 2019-10-21 NOTE — H&P (Signed)
Patient Demographics:    Steven [name], is a 69 y.o. male  MRN: [id number]   DOB - 11/22/1950  Admit Date - 10/21/2019  Outpatient Primary MD for the patient is Jake Samples, PA-C   Assessment & Plan:    Principal Problem:   Weight loss Active Problems:   Rectal bleeding   Atrial flutter, paroxysmal (HCC)   Radiation proctitis   Constipation   Hemorrhoids   Rectal pain   Prostate cancer (HCC)   HTN (hypertension)   IBS (irritable bowel syndrome)  A/p 1)Rectal pain, constipation and Weight loss--- patient was evaluated by GI service as outpatient ,plan is for colonoscopy on 10/22/2019 -Due to significant rectal pain, chronic constipation and recurrent rectal bleeding GI service requested overnight observation to ensure proper colonoscopy prep --Last colonoscopy was 2014 without significant abnormalities at that time -Intermittent rectal bleeding most likely multifactorial in the setting of radiation proctitis, known hemorrhoids and known anal fissure --About 20 pound weight loss in a couple months may be related to the patient recently changing his diet due to concerns about persistent constipation  2)PAFib/Flutter --- admiited 03/01/2019 thru 03/02/2019 and again on 04/17/2019 for PAFib/Flutter--PTA was on Eliquis for stroke prophylaxis -Last dose of Eliquis was 10/19/2019 in the evening -Chads vascular score is 2 -Continue to hold Eliquis to allow for colonoscopy -Echo from 03/13/2019 with EF of 60 to 22% without diastolic dysfunction- -continue metoprolol 25 mg twice daily for rate control  3) history of prostate cancer ---s/p prostate Radiation in 2013 which radiology in radiation proctitis--patient uses a compounded cream from [place] as well as nifedipine cream  4)Cholelithiasis---  asymptomatic, expectant management  5) dizziness and concerns about absence seizures--PTA was on Topamax, previously evaluated by cardiology and neurology -Stress test from 07/13/2019 was read as low risk study with EF on gated images of 62% -Continue Topamax post discharge  6)HTN--stable, amlodipine and metoprolol as ordered   With History of - Reviewed by me  Past Medical History:  Diagnosis Date  . Arthritis   . Atrial fibrillation ([place])   . Atrial flutter ([place])   . Cancer [facility])    prostate s/p radiation Mar 2013  . Chest pain   . Fracture 08/17/2015   MULTIPLE RIB FRACTURES     FROM FALL   . Hemorrhoids   . History of radiation therapy 08/22/11-10/13/11   prostate  . Hyperlipemia   . Hypertension       Past Surgical History:  Procedure Laterality Date  . COLONOSCOPY N/A 12/19/2012   [facility]:NOIBBC 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 Normal appearing colon  . [place]  . RADIOACTIVE SEED IMPLANT     Prostate   No chief complaint on file.     HPI:    Steven [name]  is a 69 y.o. male  Reformed smoker with PMHx relevant for PAFib on Eliquis PTA, IBS with  chronic constipation and hemorrhoids, GERD, HLD, HTN and h/o Prostate Ca with resultant radiation proctitis who has had weight loss, rectal pain presents with  complaints of painful hemorrhoids for several months.  ---He also endorses constipation for over 10 years despite taking MiraLAX and watching his diet closely. Patient is followed by [facility], he has tried hemorrhoid cream   and Amitiza  , as well as   Benefiber/Metamucil daily and  2-3 sitz bath daily. -- Due to significant rectal pain, chronic constipation and recurrent rectal bleeding GI service requested overnight observation to ensure proper colonoscopy prep  --Patient endorses about a 20 pound weight gain over the last couple months however he said he had  changed his diet significantly to try to address the chronic constipation issues  No fever  Or chills  -Denies vomiting --No hematuria  -- -Last dose of Eliquis was 10/19/2019 in the evening   Review of systems:    In addition to the HPI above,   A full Review of  Systems was done, all other systems reviewed are negative except as noted above in HPI , .    Social History:  Reviewed by me    Social History   Tobacco Use  . Smoking status: Former Smoker    Packs/day: 1.00    Years: 20.00    Pack years: 20.00    Types: Cigarettes    Quit date: 2020    Years since quitting: 1.2  . Smokeless tobacco: Former Systems developer  . Tobacco comment: Quit smoking x 2 years    07/2015   SOMETIIMES i USE VAPOR   Substance Use Topics  . Alcohol use: Never     Family History :  Reviewed by me    Family History  Problem Relation Age of Onset  . Aneurysm Mother   . Prostate cancer Father   . Colon cancer Neg Hx   . Colon polyps Neg Hx      Home Medications:   Prior to Admission medications   Medication Sig Start Date End Date Taking? Authorizing Provider  amLODipine (NORVASC) 10 MG tablet Take 1 tablet (10 mg total) by mouth daily. 04/27/19   [name], MD  amLODipine (NORVASC) 5 MG tablet Take 5 mg by mouth daily. 07/06/19   [provider]  apixaban (ELIQUIS) 5 MG TABS tablet Take 1 tablet (5 mg total) by mouth 2 (two) times daily. 09/19/19   Josue Hector, MD  Cholecalciferol (VITAMIN D3) 125 MCG (5000 UT) CAPS Take 1 capsule by mouth daily.    [provider]  Cyanocobalamin (B-12 PO) Take 1 tablet by mouth daily.    [provider]  diazepam (VALIUM) 5 MG tablet Take 5 mg by mouth daily as needed. 05/13/19   [provider]  dicyclomine (BENTYL) 20 MG tablet Take 1 tablet (20 mg total) by mouth 2 (two) times daily. 10/05/19   Avegno, Darrelyn Hillock, FNP  hydrochlorothiazide (HYDRODIURIL) 25 MG tablet Take 25 mg by mouth daily. 07/28/19    [provider]  hydrocortisone (ANUSOL-HC) 2.5 % rectal cream Place 1 application rectally 4 (four) times daily. 08/19/19   Annitta Needs, NP  hydrocortisone (ANUSOL-HC) 25 MG suppository Place 1 suppository (25 mg total) rectally 2 (two) times daily as needed for hemorrhoids or anal itching. 10/10/19   Erenest Rasher, PA-C  hydrocortisone-pramoxine [facility]) rectal foam Place 1 applicator rectally 2 (two) times daily. 10/05/19   Avegno, Darrelyn Hillock, FNP  Lidocaine, Anorectal,  5 % GEL Apply 1 application topically 3 (three) times daily as needed (rectal pain). 09/28/19   Zigmund Gottron, NP  losartan (COZAAR) 100 MG tablet Take 1 tablet (100 mg total) by mouth daily. 04/19/19   Roxan Hockey, MD  metoprolol tartrate (LOPRESSOR) 25 MG tablet Take 1 tablet (25 mg total) by mouth 2 (two) times daily. 04/18/19   Will Heinkel, MD  Na Sulfate-K Sulfate-Mg Sulf (SUPREP BOWEL PREP KIT) 17.5-3.13-1.6 GM/177ML SOLN Take 1 kit by mouth as directed. 09/04/19   Rourk, Cristopher Estimable, MD  ondansetron (ZOFRAN) 4 MG tablet Take 1 tablet (4 mg total) by mouth every 8 (eight) hours as needed for nausea or vomiting. 10/05/19   Avegno, Darrelyn Hillock, FNP  pantoprazole (PROTONIX) 40 MG tablet Take 1 tablet (40 mg total) by mouth daily. 30 minutes before breakfast 05/30/19   Annitta Needs, NP  polyethylene glycol powder [facility]) 17 GM/SCOOP powder Take by mouth. 08/19/15   [provider]  potassium chloride SA (KLOR-CON) 20 MEQ tablet Take 1 tablet (20 mEq total) by mouth daily. 04/27/19   [name], MD  predniSONE (STERAPRED UNI-PAK 21 TAB) 10 MG (21) TBPK tablet See admin instructions. follow package directions 09/30/19   [provider]  rosuvastatin (CRESTOR) 10 MG tablet Take 10 mg by mouth at bedtime. 08/22/19   [provider]  topiramate (TOPAMAX) 100 MG tablet Take 100 mg by mouth daily. 09/04/19   [provider]  topiramate (TOPAMAX) 25 MG tablet Take  100 mg by mouth 2 (two) times daily. 07/09/19   [provider]  traMADol (ULTRAM) 50 MG tablet Take 1 tablet (50 mg total) by mouth every 6 (six) hours as needed. 09/26/19 09/25/20  Erenest Rasher, PA-C  UNKNOWN TO PATIENT Stool softener    [provider]  vitamin C (ASCORBIC ACID) 500 MG tablet Take 500 mg by mouth daily.    [provider]  VITAMIN E PO Take 1 tablet by mouth daily.    [provider]     Allergies:    No Known Allergies   Physical Exam:   Vitals  Blood pressure 118/65, pulse (!) 50, temperature 98.5 F (36.9 C), temperature source Oral, resp. rate 18, height 5' 6"  (1.676 m), weight 82 kg, SpO2 100 %.  Physical Examination: General appearance - alert, well appearing, and in no distress  Mental status - alert, oriented to person, place, and time,  Eyes - sclera anicteric Neck - supple, no JVD elevation , Chest - clear  to auscultation bilaterally, symmetrical air movement,  Heart - S1 and S2 normal, regular (H/o Afib/Flutter) Abdomen - soft, nontender, nondistended, healed midline laparotomy scar  neurological - screening mental status exam normal, neck supple without rigidity, cranial nerves II through XII intact, DTR's normal and symmetric Extremities - no pedal edema noted, intact peripheral pulses  Skin - warm, dry   Data Review:    CBC No results for input(s): WBC, HGB, HCT, PLT, MCV, MCH, MCHC, RDW, LYMPHSABS, MONOABS, EOSABS, BASOSABS, BANDABS in the last 168 hours.  Invalid input(s): NEUTRABS, BANDSABD ------------------------------------------------------------------------------------------------------------------  Chemistries  No results for input(s): NA, K, CL, CO2, GLUCOSE, BUN, CREATININE, CALCIUM, MG, AST, ALT, ALKPHOS, BILITOT in the last 168 hours.  Invalid input(s): GFRCGP ------------------------------------------------------------------------------------------------------------------ CrCl cannot be  calculated (Patient's most recent lab result is older than the maximum 21 days allowed.). ------------------------------------------------------------------------------------------------------------------ No results for input(s): TSH, T4TOTAL, T3FREE, THYROIDAB in the last 72 hours.  Invalid input(s): FREET3  Coagulation profile No results for input(s): INR, PROTIME in the last 168 hours. ------------------------------------------------------------------------------------------------------------------- No results for input(s): DDIMER in the last 72 hours. -------------------------------------------------------------------------------------------------------------------  Cardiac Enzymes No results for input(s): CKMB, TROPONINI, MYOGLOBIN in the last 168 hours.  Invalid input(s): CK ------------------------------------------------------------------------------------------------------------------ No results found for: BNP   Urinalysis    Component Value Date/Time   COLORURINE STRAW (A) 04/17/2019 1913   APPEARANCEUR CLEAR 04/17/2019 1913   LABSPEC 1.003 (L) 04/17/2019 1913   PHURINE 7.0 04/17/2019 1913   GLUCOSEU NEGATIVE 04/17/2019 1913   HGBUR NEGATIVE 04/17/2019 [place] 04/17/2019 1913   KETONESUR NEGATIVE 04/17/2019 1913   PROTEINUR NEGATIVE 04/17/2019 1913   UROBILINOGEN 1.0 08/03/2012 1156   NITRITE NEGATIVE 04/17/2019 1913   LEUKOCYTESUR NEGATIVE 04/17/2019 1913    ----------------------------------------------------------------------------------------------------------------   Imaging Results:    No results found.  Radiological Exams on Admission: No results found.  DVT Prophylaxis -SCD  /TEDs (Hold Eliquis) AM Labs Ordered, also please review Full Orders  Family Communication: Admission, patients condition and plan of care including tests being ordered have been discussed with the patient who indicate understanding and agree with the plan    Code Status - Full Code  Likely DC to home in 1 to 2 days post colonoscopy  Condition   Stable  Roxan Hockey M.D on 10/21/2019 at 2:21 PM Go to [email or web address] -  for contact info  [facility] - Office  [phone]

## 2019-10-21 NOTE — Consult Note (Signed)
Referring Provider: [facility] Primary Care Physician:  Scherrie Bateman Primary Gastroenterologist:  Dr. Gala Romney  Date of Admission: 10/21/19 Date of Consultation: 10/21/19 Reason for Consultation: Admission for colonoscopy prep.  HPI:  Steven [name] is a 69 y.o. year old male presenting for hospital admission for colonoscopy prep.  History of rectal bleeding in the past due to radiation proctitis identified on colonoscopy in 2014 s/p APC therapy. Also with history of constipation and GERD. Recently diagnosed with paroxysmal atrial flutter/afib Aug 2020 on Eliquis.  Over the last few months, patient has had persistent knifelike rectal pain with bowel movements, burning and stinging thereafter.  Also with ongoing rectal bleeding with BMs and fluctuations between constipation and diarrhea.  Has been tried on multiple rectal creams including [place] [facility] hemorrhoid cream compounded with nitroglycerin then later tried with compounded nifedipine neither of which seem to have helped rectal pain.  He has also been provided lidocaine ointment, tramadol, and Anusol suppositories over the course of the last few months.  Ultimately, referred him to general surgery for further evaluation of suspected anal fissure.  Dr.Jenkins saw patient on 10/02/2019.  Rectiv samples were provided to relieve rectal pain. He was advised to proceed with colonoscopy.  Recommend referral to colorectal surgery for further evaluation if he continues with proctalgia.  Patient did not feel he could tolerate bowel prep and referred to [facility] surgery.  Dr. Dema Severin saw patient.  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  Plans for nifedipine 4 times daily x6-8 weeks and follow-up in 2 months.  Advised to pursue colonoscopy for further evaluation to ensure nothing else was going on.  Dr. Elta Guadeloupe medicated with Dr. Buford Dresser and ultimately colonoscopy was scheduled for 10/22/2019.  Due to patient's concerns for  inability to tolerate prep due to significant rectal pain and lightheadedness/dizziness when having a BM, it was decided patient's prep should be completed inpatient.  Last colonoscopy 12/19/2012 with rectal bleeding secondary to radiation-induced proctitis s/p APC ablation, internal hemorrhoids, normal-appearing colon.  Today:  He continues with knifelike rectal pain with bowel movements and burning and stinging thereafter.  Mild lower abdominal pressure prior to BMs.  Some nausea prior to BMs.  No vomiting.  States he has not been eating as much as the pain is causing a decrease in his appetite and he is not sure what he should eat due to having trouble with his bowels.  Noticed that he has been losing weight. Documented 20 lb weight loss over the last month. No BM today but states he did have a BM yesterday.  Reports his bowel movements are small in caliber and he has to sit on the toilet a while to get them to come out.  He does continue to have rectal bleeding.  Reports bright red blood on toilet tissue and mixed into the stool.  No melena. States he is not having as much lightheadedness anymore with bowel movements.  Last dose of Eliquis was Sunday night.  Reports he was on clear liquids yesterday and today.  Past Medical History:  Diagnosis Date  . Arthritis   . Atrial fibrillation ([place])   . Atrial flutter ([place])   . Cancer [facility])    prostate s/p radiation Mar 2013  . Chest pain   . Fracture 08/17/2015   MULTIPLE RIB FRACTURES     FROM FALL   . Hemorrhoids   . History of radiation therapy 08/22/11-10/13/11   prostate  . Hyperlipemia   . Hypertension  Past Surgical History:  Procedure Laterality Date  . COLONOSCOPY N/A 12/19/2012   [id number] 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 Normal appearing colon  . [place]  . RADIOACTIVE SEED IMPLANT     Prostate    Prior to Admission medications   Medication  Sig Start Date End Date Taking? Authorizing Provider  amLODipine (NORVASC) 10 MG tablet Take 1 tablet (10 mg total) by mouth daily. 04/27/19  Yes Pollina, Gwenyth Allegra, MD  apixaban (ELIQUIS) 5 MG TABS tablet Take 1 tablet (5 mg total) by mouth 2 (two) times daily. 09/19/19  Yes Josue Hector, MD  Cholecalciferol (VITAMIN D3) 125 MCG (5000 UT) CAPS Take 1 capsule by mouth daily.   Yes [provider]  Cyanocobalamin (B-12 PO) Take 1 tablet by mouth daily.   Yes [provider]  hydrochlorothiazide (HYDRODIURIL) 25 MG tablet Take 25 mg by mouth daily. 07/28/19  Yes [provider]  diazepam (VALIUM) 5 MG tablet Take 5 mg by mouth daily as needed. 05/13/19   [provider]  dicyclomine (BENTYL) 20 MG tablet Take 1 tablet (20 mg total) by mouth 2 (two) times daily. Patient not taking: Reported on 10/21/2019 10/05/19   Emerson Monte, FNP  hydrocortisone (ANUSOL-HC) 2.5 % rectal cream Place 1 application rectally 4 (four) times daily. 08/19/19   Annitta Needs, NP  hydrocortisone (ANUSOL-HC) 25 MG suppository Place 1 suppository (25 mg total) rectally 2 (two) times daily as needed for hemorrhoids or anal itching. 10/10/19   Erenest Rasher, PA-C  hydrocortisone-pramoxine [facility]) rectal foam Place 1 applicator rectally 2 (two) times daily. 10/05/19   Avegno, Darrelyn Hillock, FNP  Lidocaine, Anorectal, 5 % GEL Apply 1 application topically 3 (three) times daily as needed (rectal pain). 09/28/19   Zigmund Gottron, NP  losartan (COZAAR) 100 MG tablet Take 1 tablet (100 mg total) by mouth daily. 04/19/19   Roxan Hockey, MD  metoprolol tartrate (LOPRESSOR) 25 MG tablet Take 1 tablet (25 mg total) by mouth 2 (two) times daily. 04/18/19   Emokpae, Courage, MD  Na Sulfate-K Sulfate-Mg Sulf (SUPREP BOWEL PREP KIT) 17.5-3.13-1.6 GM/177ML SOLN Take 1 kit by mouth as directed. 09/04/19   Rourk, Cristopher Estimable, MD  ondansetron (ZOFRAN) 4 MG tablet Take 1 tablet (4 mg total) by mouth  every 8 (eight) hours as needed for nausea or vomiting. 10/05/19   Avegno, Darrelyn Hillock, FNP  pantoprazole (PROTONIX) 40 MG tablet Take 1 tablet (40 mg total) by mouth daily. 30 minutes before breakfast 05/30/19   Annitta Needs, NP  polyethylene glycol powder [facility]) 17 GM/SCOOP powder Take by mouth. 08/19/15   [provider]  potassium chloride SA (KLOR-CON) 20 MEQ tablet Take 1 tablet (20 mEq total) by mouth daily. 04/27/19   [name], MD  predniSONE (STERAPRED UNI-PAK 21 TAB) 10 MG (21) TBPK tablet See admin instructions. follow package directions 09/30/19   [provider]  rosuvastatin (CRESTOR) 10 MG tablet Take 10 mg by mouth at bedtime. 08/22/19   [provider]  topiramate (TOPAMAX) 100 MG tablet Take 100 mg by mouth daily. 09/04/19   [provider]  topiramate (TOPAMAX) 25 MG tablet Take 100 mg by mouth 2 (two) times daily. 07/09/19   [provider]  traMADol (ULTRAM) 50 MG tablet Take 1 tablet (50 mg total) by mouth every 6 (six) hours as needed. 09/26/19 09/25/20  Erenest Rasher, PA-C  UNKNOWN  TO PATIENT Stool softener    [provider]  vitamin C (ASCORBIC ACID) 500 MG tablet Take 500 mg by mouth daily.    [provider]  VITAMIN E PO Take 1 tablet by mouth daily.    [provider]    Current Facility-Administered Medications  Medication Dose Route Frequency Provider Last Rate Last Admin  . 0.9 %  sodium chloride infusion  250 mL Intravenous PRN Emokpae, Courage, MD      . acetaminophen (TYLENOL) tablet 650 mg  650 mg Oral Q6H PRN Emokpae, Courage, MD       Or  . acetaminophen (TYLENOL) suppository 650 mg  650 mg Rectal Q6H PRN Emokpae, Courage, MD      . albuterol (PROVENTIL) (2.5 MG/3ML) 0.083% nebulizer solution 2.5 mg  2.5 mg Nebulization Q2H PRN Emokpae, Courage, MD      . amLODipine (NORVASC) tablet 5 mg  5 mg Oral Daily Emokpae, Courage, MD      . metoprolol tartrate (LOPRESSOR)  tablet 25 mg  25 mg Oral BID Emokpae, Courage, MD      . morphine 2 MG/ML injection 2 mg  2 mg Intravenous Q4H PRN Emokpae, Courage, MD      . ondansetron (ZOFRAN) tablet 4 mg  4 mg Oral Q6H PRN Emokpae, Courage, MD       Or  . ondansetron (ZOFRAN) injection 4 mg  4 mg Intravenous Q6H PRN Emokpae, Courage, MD      . pantoprazole (PROTONIX) EC tablet 40 mg  40 mg Oral Daily Emokpae, Courage, MD   40 mg at 10/21/19 1316  . polyethylene glycol (MIRALAX / GLYCOLAX) packet 17 g  17 g Oral Daily PRN Emokpae, Courage, MD      . rosuvastatin (CRESTOR) tablet 10 mg  10 mg Oral QHS Emokpae, Courage, MD      . sodium chloride flush (NS) 0.9 % injection 3 mL  3 mL Intravenous Q12H Emokpae, Courage, MD      . sodium chloride flush (NS) 0.9 % injection 3 mL  3 mL Intravenous PRN Emokpae, Courage, MD      . topiramate (TOPAMAX) tablet 100 mg  100 mg Oral BID Emokpae, Courage, MD      . traZODone (DESYREL) tablet 50 mg  50 mg Oral QHS PRN Roxan Hockey, MD        Allergies as of 10/20/2019  . (No Known Allergies)    Family History  Problem Relation Age of Onset  . Aneurysm Mother   . Prostate cancer Father   . Colon cancer Neg Hx   . Colon polyps Neg Hx     Social History   Socioeconomic History  . Marital status: Single    Spouse name: Not on file  . Number of children: Not on file  . Years of education: Not on file  . Highest education level: Not on file  Occupational History  . Occupation: Custodian     Employer: [employer]  Tobacco Use  . Smoking status: Former Smoker    Packs/day: 1.00    Years: 20.00    Pack years: 20.00    Types: Cigarettes    Quit date: 2020    Years since quitting: 1.2  . Smokeless tobacco: Former Systems developer  . Tobacco comment: Quit smoking x 2 years    07/2015   SOMETIIMES i USE VAPOR   Substance and Sexual Activity  . Alcohol use: Never  . Drug use: Never  . Sexual activity:  Not on file  Other Topics Concern  . Not on file  Social History  Narrative   Divorced   Social Determinants of Health   Financial Resource Strain:   . Difficulty of Paying Living Expenses:   Food Insecurity:   . Worried About Charity fundraiser in the Last Year:   . Arboriculturist in the Last Year:   Transportation Needs:   . Film/video editor (Medical):   Marland Kitchen Lack of Transportation (Non-Medical):   Physical Activity:   . Days of Exercise per Week:   . Minutes of Exercise per Session:   Stress:   . Feeling of Stress :   Social Connections:   . Frequency of Communication with Friends and Family:   . Frequency of Social Gatherings with Friends and Family:   . Attends Religious Services:   . Active Member of Clubs or Organizations:   . Attends Archivist Meetings:   Marland Kitchen Marital Status:   Intimate Partner Violence:   . Fear of Current or Ex-Partner:   . Emotionally Abused:   Marland Kitchen Physically Abused:   . Sexually Abused:     Review of Systems: Gen: Denies fever, chills.  CV: Denies chest pain, heart palpitations.  Resp: Denies shortness of breath with rest, cough.  GI: See HPI  Heme: See HPI  Physical Exam: Vital signs in last 24 hours: Temp:  [98.5 F (36.9 C)] 98.5 F (36.9 C) (03/30 1300) Pulse Rate:  [50] 50 (03/30 1300) Resp:  [18] 18 (03/30 1300) BP: (118)/(65) 118/65 (03/30 1300) SpO2:  [100 %] 100 % (03/30 1300) Weight:  [82 kg] 82 kg (03/30 1300)   General:   Alert,  Well-developed, well-nourished, pleasant and cooperative in NAD Head:  Normocephalic and atraumatic. Eyes:  Sclera clear, no icterus.   Conjunctiva pink. Ears:  Normal auditory acuity. Lungs:  Clear throughout to auscultation.   No wheezes, crackles, or rhonchi. No acute distress. Heart:  Regular rate and rhythm; no murmurs, clicks, rubs,  or gallops. Abdomen:  Soft, nontender and nondistended. No masses, hepatosplenomegaly or hernias noted. Normal bowel sounds, without guarding, and without rebound.   Rectal:  Deferred until time of colonoscopy.    Msk:  Symmetrical without gross deformities. Normal posture. Extremities:  Without edema. Neurologic:  Alert and  oriented x4;  grossly normal neurologically. Skin:  Intact without significant lesions or rashes. Psych:  Normal mood and affect.   Impression: 69 year old male with history of atrial fibrillation on Eliquis and radiation proctitis s/p APC therapy in 2014 who has had ongoing rectal bleeding, rectal pain with BMs failing outpatient management, alternating constipation and diarrhea, small caliber stools, and now with documented 20 lb weight loss over the last month presenting today for colon prep with plans for colonoscopy tomorrow. He was brought inpatient due to concerns of severe pain at home and lightheadedness/dizziness with BMs which he reports has improved.   Is been suspected that patient has an anal fissure as a cause of his rectal pain and rectal bleeding however he has failed multiple topical therapies.  He has been seen by Dr. Arnoldo Morale with general surgery as well as Dr. Dema Severin with [facility] most recently.  Appears patient does have a anterior midline fissure and was prescribed nifedipine ointment 4 times daily.  Ultimately, it was recommended that patient pursue colonoscopy to evaluate for any other causes of rectal pain and rectal bleeding.    Differentials include bleeding from prior radiation proctitis, hemorrhoids,  anal fissure, or possible malignancy.  Plan: Complete colon prep with GoLYTELY today. Tapwater enemas x2 tomorrow morning. Colonoscopy with propofol with Dr. Gala Romney tomorrow as scheduled.  Will need to reassess in the morning to ensure good prep.   LOS: 1 day    10/21/2019, 3:42 PM   [name], [facility]

## 2019-10-21 NOTE — Plan of Care (Signed)

## 2019-10-22 ENCOUNTER — Ambulatory Visit (HOSPITAL_COMMUNITY): Admit: 2019-10-22 | Payer: BC Managed Care – PPO | Admitting: Internal Medicine

## 2019-10-22 ENCOUNTER — Encounter (HOSPITAL_COMMUNITY): Payer: Self-pay

## 2019-10-22 ENCOUNTER — Telehealth: Payer: Self-pay

## 2019-10-22 DIAGNOSIS — K921 Melena: Secondary | ICD-10-CM | POA: Diagnosis not present

## 2019-10-22 DIAGNOSIS — I4892 Unspecified atrial flutter: Secondary | ICD-10-CM | POA: Diagnosis not present

## 2019-10-22 DIAGNOSIS — C61 Malignant neoplasm of prostate: Secondary | ICD-10-CM

## 2019-10-22 DIAGNOSIS — K649 Unspecified hemorrhoids: Secondary | ICD-10-CM

## 2019-10-22 DIAGNOSIS — K5900 Constipation, unspecified: Secondary | ICD-10-CM | POA: Diagnosis not present

## 2019-10-22 DIAGNOSIS — R634 Abnormal weight loss: Secondary | ICD-10-CM | POA: Diagnosis not present

## 2019-10-22 DIAGNOSIS — K6289 Other specified diseases of anus and rectum: Secondary | ICD-10-CM

## 2019-10-22 DIAGNOSIS — K627 Radiation proctitis: Secondary | ICD-10-CM

## 2019-10-22 DIAGNOSIS — K625 Hemorrhage of anus and rectum: Secondary | ICD-10-CM

## 2019-10-22 LAB — BASIC METABOLIC PANEL
Anion gap: 8 (ref 5–15)
Anion gap: 11 (ref 5–15)
BUN: 5 mg/dL — ABNORMAL LOW (ref 8–23)
BUN: <5 mg/dL — ABNORMAL LOW (ref 8–23)
CO2: 28 mmol/L (ref 22–32)
CO2: 26 mmol/L (ref 22–32)
Calcium: 9.4 mg/dL (ref 8.9–10.3)
Calcium: 9.6 mg/dL (ref 8.9–10.3)
Chloride: 103 mmol/L (ref 98–111)
Chloride: 102 mmol/L (ref 98–111)
Creatinine, Ser: 0.8 mg/dL (ref 0.61–1.24)
Creatinine, Ser: 0.82 mg/dL (ref 0.61–1.24)
GFR calc Af Amer: >60 mL/min (ref >60)
GFR calc Af Amer: >60 mL/min (ref >60)
GFR calc non Af Amer: >60 mL/min (ref >60)
GFR calc non Af Amer: >60 mL/min (ref >60)
Glucose, Bld: 100 mg/dL — ABNORMAL HIGH (ref 70–99)
Glucose, Bld: 103 mg/dL — ABNORMAL HIGH (ref 70–99)
Potassium: 2.8 mmol/L — ABNORMAL LOW (ref 3.5–5.1)
Potassium: 3.1 mmol/L — ABNORMAL LOW (ref 3.5–5.1)
Sodium: 139 mmol/L (ref 135–145)
Sodium: 139 mmol/L (ref 135–145)

## 2019-10-22 LAB — CBC
HCT: 40.6 % (ref 39.0–52.0)
Hemoglobin: 13.3 g/dL (ref 13.0–17.0)
MCH: 29 pg (ref 26.0–34.0)
MCHC: 32.8 g/dL (ref 30.0–36.0)
MCV: 88.5 fL (ref 80.0–100.0)
Platelets: 241 10*3/uL (ref 150–400)
RBC: 4.59 MIL/uL (ref 4.22–5.81)
RDW: 12.8 % (ref 11.5–15.5)
WBC: 5 10*3/uL (ref 4.0–10.5)
nRBC: 0 % (ref 0.0–0.2)

## 2019-10-22 LAB — MAGNESIUM: Magnesium: 1.9 mg/dL (ref 1.7–2.4)

## 2019-10-22 LAB — TSH: TSH: 0.282 u[IU]/mL — ABNORMAL LOW (ref 0.350–4.500)

## 2019-10-22 LAB — T4, FREE: Free T4: 1.37 ng/dL — ABNORMAL HIGH (ref 0.61–1.12)

## 2019-10-22 SURGERY — COLONOSCOPY WITH PROPOFOL
Anesthesia: Monitor Anesthesia Care

## 2019-10-22 MED ORDER — TOPIRAMATE 100 MG PO TABS
100.0000 mg | ORAL_TABLET | Freq: Every evening | ORAL | Status: DC | PRN
  Administered 2019-10-22: 100 mg via ORAL
  Filled 2019-10-22 (×2): qty 1

## 2019-10-22 MED ORDER — POTASSIUM CHLORIDE 20 MEQ PO PACK
40.0000 meq | PACK | Freq: Two times a day (BID) | ORAL | Status: DC
  Administered 2019-10-22 (×2): 40 meq via ORAL
  Filled 2019-10-22 (×2): qty 2

## 2019-10-22 MED ORDER — PROPOFOL 10 MG/ML IV BOLUS
INTRAVENOUS | Status: AC
  Filled 2019-10-22: qty 20

## 2019-10-22 MED ORDER — POTASSIUM CHLORIDE 10 MEQ/100ML IV SOLN
10.0000 meq | INTRAVENOUS | Status: AC
  Administered 2019-10-22 (×4): 10 meq via INTRAVENOUS
  Filled 2019-10-22 (×4): qty 100

## 2019-10-22 MED ORDER — GABAPENTIN 300 MG PO CAPS
300.0000 mg | ORAL_CAPSULE | Freq: Three times a day (TID) | ORAL | Status: DC | PRN
  Administered 2019-10-22: 300 mg via ORAL
  Filled 2019-10-22: qty 1

## 2019-10-22 MED ORDER — TOPIRAMATE 100 MG PO TABS: 100.0000 mg | ORAL_TABLET | Freq: Every day | ORAL | Status: DC

## 2019-10-22 MED ORDER — KETAMINE HCL 50 MG/5ML IJ SOSY
PREFILLED_SYRINGE | INTRAMUSCULAR | Status: AC
  Filled 2019-10-22: qty 5

## 2019-10-22 NOTE — Progress Notes (Addendum)
Entered patients room and informed him that the doctor had ordered 2 tap water enemas for this morning as part of his bowel prep for colonoscopy later today.  Patient absolutely refused the enemas stating that we were trying to kill him and that he dosen't feel like he needs them.  Explained to the patient the importance of the enemas as part of his prep and patient still refused.  Patients stool is currently clear, yellow liquid. Will pass to day shift to follow up with Dr. Gala Romney.  Will continue to monitor.

## 2019-10-22 NOTE — Telephone Encounter (Signed)
Noted  

## 2019-10-22 NOTE — Progress Notes (Signed)
PROGRESS NOTE [facility]   TALLIS KEMNA  [id number]  DOB: 04/22/1951  DOA: 10/21/2019 PCP: Jake Samples, PA-C   Brief Admission Hx: 69 year old male former smoker admitted directly from the GI office for planned colonoscopy preparation as a work-up for rectal pain, chronic constipation and weight loss.  Patient was noted to have severe hypokalemia and has required potassium replacement.  MDM/Assessment & Plan:   1. Abnormal weight loss as patient is being worked up with scheduled colonoscopy which is planned for tomorrow for October 23, 2019. 2. Rectal pain-patient is being evaluated and managed by GI and they have placed him on a laxative regimen and plan colonoscopy as noted above. 3. Rectal bleeding-colonoscopy tomorrow as ordered. 4. Hypokalemia-she received 4 runs of IV potassium but is potassium level remains low at 3.1 and he is unable to have anesthesia.  Further replacement of potassium ordered by GI and magnesium will be supplemented if needed. 5. PAF-patient is currently having his apixaban placed on hold in the setting of preparation for colonoscopy.  He is on metoprolol 25 mg twice daily for rate control. 6. History of prostate cancer status post radiation therapy in 2013 and history of radiation proctitis. 7. Hypertension-blood pressures controlled.  Follow.  DVT prophylaxis: SCDs Code Status: *Full Family Communication: Patient updated about care plan at bedside and verbalized understanding Disposition Plan: Home tomorrow after procedure   Consultants:  GI  Procedures:  Tentative colonoscopy planned for October 23, 2019  Antimicrobials:     Subjective: Patient has multiple complaints about not receiving his medications exactly as he takes at home however he did complete the bowel prep and is complaining about that as well.  He has no abdominal pain he does have chronic rectal pain.  Objective: Vitals:   10/21/19 2108 10/22/19 0407 10/22/19  1401 10/22/19 1401  BP: (!) 156/73 126/78 131/70 131/70  Pulse: 64 63 61 61  Resp: 20 19 18 18   Temp: 97.6 F (36.4 C) 98.8 F (37.1 C) 98 F (36.7 C) 98 F (36.7 C)  TempSrc: Oral Oral Oral Oral  SpO2: 100% 100%  100%  Weight:      Height:        Intake/Output Summary (Last 24 hours) at 10/22/2019 1536 Last data filed at 10/22/2019 1500 Gross per 24 hour  Intake 0 ml  Output 2000 ml  Net -2000 ml   Filed Weights   10/21/19 1300  Weight: 82 kg     REVIEW OF SYSTEMS  As per history otherwise all reviewed and reported negative  Exam:  General exam: Elderly gentleman sitting up in the bed he is awake and alert no distress. Respiratory system: Clear. No increased work of breathing. Cardiovascular system: S1 & S2 heard. No JVD, murmurs, gallops, clicks or pedal edema. Gastrointestinal system: Abdomen is nondistended, soft and nontender. Normal bowel sounds heard. Central nervous system: Alert and oriented. No focal neurological deficits. Extremities: no CCE.  Data Reviewed: Basic Metabolic Panel: Recent Labs  Lab 10/22/19 0507 10/22/19 1420  NA 139 139  K 2.8* 3.1*  CL 103 102  CO2 28 26  GLUCOSE 100* 103*  BUN 5* <5*  CREATININE 0.80 0.82  CALCIUM 9.4 9.6  MG 1.9  --    Liver Function Tests: No results for input(s): AST, ALT, ALKPHOS, BILITOT, PROT, ALBUMIN in the last 168 hours. No results for input(s): LIPASE, AMYLASE in the last 168 hours. No results for input(s): AMMONIA in the last 168 hours. CBC: Recent  Labs  Lab 10/22/19 0507  WBC 5.0  HGB 13.3  HCT 40.6  MCV 88.5  PLT 241   Cardiac Enzymes: No results for input(s): CKTOTAL, CKMB, CKMBINDEX, TROPONINI in the last 168 hours. CBG (last 3)  No results for input(s): GLUCAP in the last 72 hours. Recent Results (from the past 240 hour(s))  SARS CORONAVIRUS 2 (TAT 6-24 HRS) Nasopharyngeal Nasopharyngeal Swab     Status: None   Collection Time: 10/21/19  1:08 PM   Specimen: Nasopharyngeal Swab    Result Value Ref Range Status   SARS Coronavirus 2 NEGATIVE NEGATIVE Final    Comment: (NOTE) SARS-CoV-2 target nucleic acids are NOT DETECTED. The SARS-CoV-2 RNA is generally detectable in upper and lower respiratory specimens during the acute phase of infection. Negative results do not preclude SARS-CoV-2 infection, do not rule out co-infections with other pathogens, and should not be used as the sole basis for treatment or other patient management decisions. Negative results must be combined with clinical observations, patient history, and epidemiological information. The expected result is Negative. Fact Sheet for Patients: SugarRoll.be Fact Sheet for Healthcare Providers: https://www.woods-mathews.com/ This test is not yet approved or cleared by the [place] FDA and  has been authorized for detection and/or diagnosis of SARS-CoV-2 by FDA under an Emergency Use Authorization (EUA). This EUA will remain  in effect (meaning this test can be used) for the duration of the COVID-19 declaration under Section 56 4(b)(1) of the Act, 21 U.S.C. section 360bbb-3(b)(1), unless the authorization is terminated or revoked sooner. Performed at [facility], [place] [address] [place], [place] [address]      Studies: No results found.   Scheduled Meds: . amLODipine  5 mg Oral Daily  . metoprolol tartrate  25 mg Oral BID  . pantoprazole  40 mg Oral Daily  . potassium chloride  40 mEq Oral BID  . rosuvastatin  10 mg Oral QHS  . sodium chloride flush  3 mL Intravenous Q12H  . [START ON 10/23/2019] topiramate  100 mg Oral QHS   Continuous Infusions: . sodium chloride 250 mL (10/22/19 0925)    Principal Problem:   Weight loss Active Problems:   Prostate cancer (HCC)   Rectal bleeding   Radiation proctitis   Constipation   Hemorrhoids   Atrial flutter, paroxysmal (HCC)   HTN (hypertension)   Rectal pain   IBS (irritable bowel  syndrome)   Time spent:   Irwin Brakeman, MD [facility] 10/22/2019, 3:36 PM    LOS: 1 day  How to contact the [facility] Attending or Consulting provider [place] or covering provider during after hours [place], for this patient?  1. Check the care team in [facility] and look for a) attending/consulting [facility] provider listed and b) the [facility] team listed 2. Log into [email or web address] and use 's universal password to access. If you do not have the password, please contact the hospital operator. 3. Locate the [facility] provider you are looking for under [facility] and page to a number that you can be directly reached. 4. If you still have difficulty reaching the provider, please page the [facility] (Director on Call) for the Hospitalists listed on amion for assistance.

## 2019-10-22 NOTE — Progress Notes (Addendum)
Patient completed prep appropriately but refusing enemas. Output is clear. Notes rectal pain, which has been ongoing. Focused on his left arm tingling, elbow tenderness, which is CHRONIC. Fixated on this and difficult to keep on track regarding plan for today.   I discussed again risks and benefits of colonoscopy with patient. States understanding. Last dose of Eliquis Sunday night. NPO except sips with meds.   Potassium 2.8 this morning. Informed Jeralyn Bennett, RN, in Endo. IV potassium has been ordered. I discussed with nursing staff as well.   Covid negative.   He should be appropriate for discharge home after colonoscopy.   Annitta Needs, PhD, ANP-BC [facility]

## 2019-10-22 NOTE — Telephone Encounter (Signed)
Pt called. Pt states his potassium was down and his procedure changed to tomorrow 10/23/2019. Pt would like a call on his cell to touch basis with him before his procedure.

## 2019-10-22 NOTE — Anesthesia Preprocedure Evaluation (Addendum)
Anesthesia Evaluation  Patient identified by MRN, date of birth, ID band Patient awake    Reviewed: Allergy & Precautions, NPO status , Patient's Chart, lab work & pertinent test results, reviewed documented beta blocker date and time   Airway Mallampati: II  TM Distance: >3 FB Neck ROM: Full    Dental  (+) Dental Advisory Given, Missing   Pulmonary shortness of breath and with exertion, former smoker,    Pulmonary exam normal breath sounds clear to auscultation       Cardiovascular Exercise Tolerance: Good hypertension, Pt. on medications and Pt. on home beta blockers + dysrhythmias Atrial Fibrillation  Rhythm:Regular Rate:Normal  27-Apr-2019 03:26:57 [facility]-ER ROUTINE RECORD Sinus rhythm Ventricular premature complex Borderline T wave abnormalities No significant change since last tracing Confirmed by [name] [phone]) on 04/27/2019 3:49:32 AM   Neuro/Psych    GI/Hepatic Neg liver ROS, Bowel prep,  Endo/Other  negative endocrine ROS  Renal/GU negative Renal ROS     Musculoskeletal  (+) Arthritis ,   Abdominal   Peds  Hematology negative hematology ROS (+)   Anesthesia Other Findings   Reproductive/Obstetrics                           Anesthesia Physical Anesthesia Plan  ASA: III  Anesthesia Plan: General   Post-op Pain Management:    Induction: Intravenous  PONV Risk Score and Plan: 2 and TIVA and Treatment may vary due to age or medical condition  Airway Management Planned: Nasal Cannula, Natural Airway and Simple Face Mask  Additional Equipment:   Intra-op Plan:   Post-operative Plan:   Informed Consent: I have reviewed the patients History and Physical, chart, labs and discussed the procedure including the risks, benefits and alternatives for the proposed anesthesia with the patient or authorized representative who has indicated his/her  understanding and acceptance.     Dental advisory given  Plan Discussed with: CRNA  Anesthesia Plan Comments:       Anesthesia Quick Evaluation

## 2019-10-22 NOTE — Progress Notes (Addendum)
Colonoscopy postponed till tomorrow. Needs additional potassium replacement. May have clear liquids remainder of today but NPO after midnight. Oral potassium ordered. Nursing staff aware.   Attending note: Agree with plans for holding off on colonoscopy until tomorrow.

## 2019-10-22 NOTE — Telephone Encounter (Signed)
Our team is at the hospital following him along. No need for me to call him today as I am not working in the hospital today or tomorrow. Steven [name] saw him this morning. I reviewed her note. His potassium was low. He has received supplementation with plans for TCS tomorrow. He will be checked on again tomorrow morning prior to his procedure. I am unable to provide any changes to his plan while he in inpatient. Would defer recommendations to the team rounding in the hospital or the hospitalitis over his care.

## 2019-10-22 NOTE — Progress Notes (Signed)
Colonoscopy postponed until tomorrow due to potassium levels.  Received 4 k riders today as well as po dose.  Patient concerned about not receiving all of home meds.  Neurontin ordred prn and just received dose.  Hydrochlorithiazide not ordred but receiving other bp meds and bp ok.  Not receiving eliquis. Dr. Wynetta Emery aware.

## 2019-10-22 NOTE — Progress Notes (Signed)
Patients stool is now clear yellow with minimum sediment.

## 2019-10-23 ENCOUNTER — Encounter (HOSPITAL_COMMUNITY): Payer: Self-pay | Admitting: Family Medicine

## 2019-10-23 ENCOUNTER — Ambulatory Visit (HOSPITAL_COMMUNITY): Admit: 2019-10-23 | Payer: BC Managed Care – PPO | Admitting: Internal Medicine

## 2019-10-23 ENCOUNTER — Encounter (HOSPITAL_COMMUNITY): Payer: Self-pay

## 2019-10-23 ENCOUNTER — Encounter (HOSPITAL_COMMUNITY)
Admission: RE | Disposition: A | Discharge status: Home / Self Care | Payer: Self-pay | Source: Ambulatory Visit | Attending: Family Medicine

## 2019-10-23 ENCOUNTER — Observation Stay (HOSPITAL_COMMUNITY): Payer: BC Managed Care – PPO | Admitting: Anesthesiology

## 2019-10-23 DIAGNOSIS — K5520 Angiodysplasia of colon without hemorrhage: Secondary | ICD-10-CM

## 2019-10-23 DIAGNOSIS — K649 Unspecified hemorrhoids: Secondary | ICD-10-CM | POA: Diagnosis not present

## 2019-10-23 DIAGNOSIS — R634 Abnormal weight loss: Secondary | ICD-10-CM | POA: Diagnosis not present

## 2019-10-23 DIAGNOSIS — I4892 Unspecified atrial flutter: Secondary | ICD-10-CM | POA: Diagnosis not present

## 2019-10-23 DIAGNOSIS — K921 Melena: Secondary | ICD-10-CM | POA: Diagnosis not present

## 2019-10-23 DIAGNOSIS — K5900 Constipation, unspecified: Secondary | ICD-10-CM | POA: Diagnosis not present

## 2019-10-23 HISTORY — PX: HOT HEMOSTASIS: SHX5433

## 2019-10-23 HISTORY — PX: COLONOSCOPY WITH PROPOFOL: SHX5780

## 2019-10-23 LAB — BASIC METABOLIC PANEL
Anion gap: 10 (ref 5–15)
BUN: <5 mg/dL — ABNORMAL LOW (ref 8–23)
CO2: 24 mmol/L (ref 22–32)
Calcium: 9.8 mg/dL (ref 8.9–10.3)
Chloride: 108 mmol/L (ref 98–111)
Creatinine, Ser: 0.9 mg/dL (ref 0.61–1.24)
GFR calc Af Amer: >60 mL/min (ref >60)
GFR calc non Af Amer: >60 mL/min (ref >60)
Glucose, Bld: 103 mg/dL — ABNORMAL HIGH (ref 70–99)
Potassium: 3.1 mmol/L — ABNORMAL LOW (ref 3.5–5.1)
Sodium: 142 mmol/L (ref 135–145)

## 2019-10-23 LAB — MAGNESIUM: Magnesium: 2 mg/dL (ref 1.7–2.4)

## 2019-10-23 SURGERY — COLONOSCOPY WITH PROPOFOL
Anesthesia: Monitor Anesthesia Care

## 2019-10-23 SURGERY — COLONOSCOPY WITH PROPOFOL
Anesthesia: General

## 2019-10-23 MED ORDER — MUSCLE RUB 10-15 % EX CREA
TOPICAL_CREAM | CUTANEOUS | Status: DC | PRN
  Filled 2019-10-23: qty 85

## 2019-10-23 MED ORDER — LIDOCAINE 2% (20 MG/ML) 5 ML SYRINGE
INTRAMUSCULAR | Status: AC
  Filled 2019-10-23: qty 5

## 2019-10-23 MED ORDER — APIXABAN 5 MG PO TABS: 5.0000 mg | ORAL_TABLET | Freq: Two times a day (BID) | ORAL | 5 refills | Status: DC

## 2019-10-23 MED ORDER — DOCUSATE SODIUM 100 MG PO CAPS
100.0000 mg | ORAL_CAPSULE | Freq: Two times a day (BID) | ORAL | Status: DC
  Filled 2019-10-23: qty 1

## 2019-10-23 MED ORDER — PROPOFOL 10 MG/ML IV BOLUS
INTRAVENOUS | Status: AC
  Filled 2019-10-23: qty 40

## 2019-10-23 MED ORDER — POTASSIUM CHLORIDE 10 MEQ/100ML IV SOLN
10.0000 meq | INTRAVENOUS | Status: AC
  Administered 2019-10-23 (×2): 10 meq via INTRAVENOUS
  Filled 2019-10-23: qty 100

## 2019-10-23 MED ORDER — DOCUSATE SODIUM 100 MG PO CAPS: 100.0000 mg | ORAL_CAPSULE | Freq: Two times a day (BID) | ORAL | 0 refills | Status: DC

## 2019-10-23 MED ORDER — FENTANYL CITRATE (PF) 100 MCG/2ML IJ SOLN
INTRAMUSCULAR | Status: AC
  Filled 2019-10-23: qty 2

## 2019-10-23 MED ORDER — PROPOFOL 10 MG/ML IV BOLUS
INTRAVENOUS | Status: DC | PRN
  Administered 2019-10-23 (×2): 50 mg via INTRAVENOUS

## 2019-10-23 MED ORDER — PROPOFOL 500 MG/50ML IV EMUL
INTRAVENOUS | Status: DC | PRN
  Administered 2019-10-23: 150 ug/kg/min via INTRAVENOUS

## 2019-10-23 MED ORDER — FENTANYL CITRATE (PF) 100 MCG/2ML IJ SOLN
50.0000 ug | INTRAMUSCULAR | Status: AC | PRN
  Administered 2019-10-23 (×2): 50 ug via INTRAVENOUS

## 2019-10-23 MED ORDER — LACTATED RINGERS IV SOLN: INTRAVENOUS | Status: DC | PRN

## 2019-10-23 NOTE — Discharge Summary (Signed)
Physician Discharge Summary  Steven [name] YPP:[id number] DOB: 02/25/1951 DOA: 10/21/2019  PCP: Jake Samples, PA-C GI: [facility]  Admit date: 10/21/2019 Discharge date: 10/23/2019  Admitted From:  HOME  Disposition: HOME   Recommendations for Outpatient Follow-up:  1. Follow up with PCP in 2 weeks 2. Follow-up with [facility] in 6 weeks 3. Establish care with endocrinology in 2 weeks to follow-up abnormal thyroid levels  Discharge Condition: STABLE   CODE STATUS: FULL    Brief Hospitalization Summary: Please see all hospital notes, images, labs for full details of the hospitalization. ADMISSION HPI:  Steven [name]  is a 69 y.o. male  Reformed smoker with PMHx relevant for PAFib on Eliquis PTA, IBS with chronic constipation and hemorrhoids, GERD, HLD, HTN and h/o Prostate Ca with resultant radiation proctitis who has had weight loss, rectal pain presents with  complaints of painful hemorrhoids for several months.  ---He also endorses constipation for over 10 years despite taking MiraLAX and watching his diet closely. Patient is followed by [facility], he has tried hemorrhoid cream   and Amitiza  , as well as   Benefiber/Metamucil daily and  2-3 sitz bath daily. -- Due to significant rectal pain, chronic constipation and recurrent rectal bleeding GI service requested overnight observation to ensure proper colonoscopy prep  --Patient endorses about a 20 pound weight gain over the last couple months however he said he had changed his diet significantly to try to address the chronic constipation issues  No fever  Or chills  -Denies vomiting --No hematuria  -- -Last dose of Eliquis was 10/19/2019 in the evening  Brief Admission Hx: 69 year old male former smoker admitted directly from the GI office for planned colonoscopy preparation as a work-up for rectal pain, chronic constipation and weight loss.  Patient was noted to have severe hypokalemia  and has required potassium replacement.  MDM/Assessment & Plan:   1. Abnormal weight loss -mildly elevated free T4 and suppressed TSH will warrant endocrinology follow-up.  Ambulatory referral to endocrinology made. 2. Rectal pain-patient is being evaluated and managed by GI and they have placed him on a laxative regimen and plan colonoscopy as noted above.  Dr. Gala Romney recommends Colace 100 mg twice daily. 3. Rectal bleeding-colonoscopy revealed findings of internal and external hemorrhoids and radiation proctitis.  GI reports the patient okay to discharge home.  Follow-up with GI outpatient in 6 weeks. 4. Hypokalemia-He received additional IV potassium today.  5. PAF-patient is currently having his apixaban placed on hold in the setting of preparation for colonoscopy.  He is on metoprolol 25 mg twice daily for rate control.  Per Dr. Gala Romney patient can restart apixaban on October 27, 2019. 6. Subclinical hyperthyroidism-mildly elevated free T4 and suppressed TSH.  Patient is under stress in the hospital now.  I am recommending that he establish care with our local endocrinologist to have labs retested. 7. History of prostate cancer status post radiation therapy in 2013 and history of radiation proctitis. 8. Hypertension-blood pressures controlled.    Resume home medications.  DVT prophylaxis: SCDs Code Status: Full Family Communication: Patient updated about care plan at bedside and verbalized understanding Disposition Plan: Home   Consultants:  GI  Procedures: Colonoscopy October 23, 2019 with Dr. Gala Romney   Discharge Diagnoses:  Principal Problem:   Weight loss Active Problems:   Prostate cancer [facility])   Rectal bleeding   Radiation proctitis   Constipation   Hemorrhoids   Atrial flutter, paroxysmal (HCC)   HTN (hypertension)  Rectal pain   IBS (irritable bowel syndrome)   Discharge Instructions: Discharge Instructions    Ambulatory referral to Endocrinology   Complete by: As  directed    Increase activity slowly   Complete by: As directed      Allergies as of 10/23/2019   No Known Allergies     Medication List    STOP taking these medications   polyethylene glycol powder 17 GM/SCOOP powder Commonly known as: GLYCOLAX/MIRALAX   predniSONE 10 MG (21) Tbpk tablet Commonly known as: STERAPRED UNI-PAK 21 TAB   Suprep Bowel Prep Kit 17.5-3.13-1.6 GM/177ML Soln Generic drug: Na Sulfate-K Sulfate-Mg Sulf     TAKE these medications   amLODipine 10 MG tablet Commonly known as: NORVASC Take 1 tablet (10 mg total) by mouth daily.   apixaban 5 MG Tabs tablet Commonly known as: Eliquis Take 1 tablet (5 mg total) by mouth 2 (two) times daily. Start taking on: October 27, 2019 What changed: These instructions start on October 27, 2019. If you are unsure what to do until then, ask your doctor or other care provider.   B-12 PO Take 1 tablet by mouth daily.   diazepam 5 MG tablet Commonly known as: VALIUM Take 5 mg by mouth daily as needed for anxiety.   docusate sodium 100 MG capsule Commonly known as: COLACE Take 1 capsule (100 mg total) by mouth 2 (two) times daily.   gabapentin 300 MG capsule Commonly known as: NEURONTIN Take 300 mg by mouth 3 (three) times daily.   hydrochlorothiazide 25 MG tablet Commonly known as: HYDRODIURIL Take 25 mg by mouth daily.   hydrocortisone 2.5 % rectal cream Commonly known as: ANUSOL-HC Place 1 application rectally 4 (four) times daily.   hydrocortisone 25 MG suppository Commonly known as: ANUSOL-HC Place 1 suppository (25 mg total) rectally 2 (two) times daily as needed for hemorrhoids or anal itching.   Lidocaine (Anorectal) 5 % Gel Apply 1 application topically 3 (three) times daily as needed (rectal pain).   losartan 100 MG tablet Commonly known as: COZAAR Take 1 tablet (100 mg total) by mouth daily.   metoprolol tartrate 25 MG tablet Commonly known as: LOPRESSOR Take 1 tablet (25 mg total) by mouth 2  (two) times daily.   ondansetron 4 MG tablet Commonly known as: Zofran Take 1 tablet (4 mg total) by mouth every 8 (eight) hours as needed for nausea or vomiting.   pantoprazole 40 MG tablet Commonly known as: PROTONIX Take 1 tablet (40 mg total) by mouth daily. 30 minutes before breakfast   potassium chloride SA 20 MEQ tablet Commonly known as: KLOR-CON Take 1 tablet (20 mEq total) by mouth daily.   psyllium 58.6 % powder Commonly known as: METAMUCIL Take 1 packet by mouth daily.   rosuvastatin 10 MG tablet Commonly known as: CRESTOR Take 10 mg by mouth every morning.   topiramate 100 MG tablet Commonly known as: TOPAMAX Take 100 mg by mouth at bedtime.   traMADol 50 MG tablet Commonly known as: Ultram Take 1 tablet (50 mg total) by mouth every 6 (six) hours as needed.   vitamin C 500 MG tablet Commonly known as: ASCORBIC ACID Take 500 mg by mouth daily.   Vitamin D3 125 MCG (5000 UT) Caps Take 1 capsule by mouth daily.   VITAMIN E PO Take 1 tablet by mouth daily.      Follow-up Information    [facility]. Schedule an appointment as soon as possible for a visit in  6 week(s).   Why: Hospital follow-up Contact information: [address] [place] [phone]       Jake Samples, [place]. Schedule an appointment as soon as possible for a visit in 2 week(s).   Specialty: Family Medicine Contact information: [address] [place] [address] [phone]        Josue Hector, MD .   Specialty: Cardiology Contact information: [phone] [address] [place] [address] [phone]        Cassandria Anger, MD. Schedule an appointment as soon as possible for a visit in 2 week(s).   Specialty: Endocrinology Why: ESTABLISH CARE REGARDING THYROID Contact information: [place] [address] [phone]          No Known Allergies Allergies as of  10/23/2019   No Known Allergies     Medication List    STOP taking these medications   polyethylene glycol powder 17 GM/SCOOP powder Commonly known as: GLYCOLAX/MIRALAX   predniSONE 10 MG (21) Tbpk tablet Commonly known as: STERAPRED UNI-PAK 21 TAB   Suprep Bowel Prep Kit 17.5-3.13-1.6 GM/177ML Soln Generic drug: Na Sulfate-K Sulfate-Mg Sulf     TAKE these medications   amLODipine 10 MG tablet Commonly known as: NORVASC Take 1 tablet (10 mg total) by mouth daily.   apixaban 5 MG Tabs tablet Commonly known as: Eliquis Take 1 tablet (5 mg total) by mouth 2 (two) times daily. Start taking on: October 27, 2019 What changed: These instructions start on October 27, 2019. If you are unsure what to do until then, ask your doctor or other care provider.   B-12 PO Take 1 tablet by mouth daily.   diazepam 5 MG tablet Commonly known as: VALIUM Take 5 mg by mouth daily as needed for anxiety.   docusate sodium 100 MG capsule Commonly known as: COLACE Take 1 capsule (100 mg total) by mouth 2 (two) times daily.   gabapentin 300 MG capsule Commonly known as: NEURONTIN Take 300 mg by mouth 3 (three) times daily.   hydrochlorothiazide 25 MG tablet Commonly known as: HYDRODIURIL Take 25 mg by mouth daily.   hydrocortisone 2.5 % rectal cream Commonly known as: ANUSOL-HC Place 1 application rectally 4 (four) times daily.   hydrocortisone 25 MG suppository Commonly known as: ANUSOL-HC Place 1 suppository (25 mg total) rectally 2 (two) times daily as needed for hemorrhoids or anal itching.   Lidocaine (Anorectal) 5 % Gel Apply 1 application topically 3 (three) times daily as needed (rectal pain).   losartan 100 MG tablet Commonly known as: COZAAR Take 1 tablet (100 mg total) by mouth daily.   metoprolol tartrate 25 MG tablet Commonly known as: LOPRESSOR Take 1 tablet (25 mg total) by mouth 2 (two) times daily.   ondansetron 4 MG tablet Commonly known as: Zofran Take 1 tablet (4 mg  total) by mouth every 8 (eight) hours as needed for nausea or vomiting.   pantoprazole 40 MG tablet Commonly known as: PROTONIX Take 1 tablet (40 mg total) by mouth daily. 30 minutes before breakfast   potassium chloride SA 20 MEQ tablet Commonly known as: KLOR-CON Take 1 tablet (20 mEq total) by mouth daily.   psyllium 58.6 % powder Commonly known as: METAMUCIL Take 1 packet by mouth daily.   rosuvastatin 10 MG tablet Commonly known as: CRESTOR Take 10 mg by mouth every morning.   topiramate 100 MG tablet Commonly known as: TOPAMAX Take 100 mg by mouth at  bedtime.   traMADol 50 MG tablet Commonly known as: Ultram Take 1 tablet (50 mg total) by mouth every 6 (six) hours as needed.   vitamin C 500 MG tablet Commonly known as: ASCORBIC ACID Take 500 mg by mouth daily.   Vitamin D3 125 MCG (5000 UT) Caps Take 1 capsule by mouth daily.   VITAMIN E PO Take 1 tablet by mouth daily.       Procedures/Studies: Colonoscopy 10/23/19 Impression:      - Non-bleeding external and internal hemorrhoids.                            Abnormal rectal blood vessels consistent with radiation proctitis. Status post APC treatment.                           - No specimens collected. Moderate Sedation:      Moderate (conscious) sedation was personally administered by an       anesthesia professional. The following parameters were monitored: oxygen       saturation, heart rate, blood pressure, respiratory rate, EKG, adequacy       of pulmonary ventilation, and response to care.  Recommendation:  - Patient has a contact number available for                            emergencies. The signs and symptoms of potential delayed complications were discussed with the                            patient. Return to normal activities tomorrow.                            Written discharge instructions were provided to the patient.                           - Return patient to hospital ward for possible  discharge same day.                           -Heart healthy diet.                           - Continue present medications. Begin Colace 100 mg wice daily. Resume Eliquis April 5. Office visit with [place] in 6 weeks.    From a GI standpoint, patient could be discharged later today. Hospitalist assistance with management greatly appreciated.  CT PELVIS W CONTRAST  Result Date: 09/25/2019 CLINICAL DATA:  69 year old male with rectal pain and blood on toilet paper beginning 3 weeks ago. History of prostate cancer radiation treatment. EXAM: CT PELVIS WITH CONTRAST TECHNIQUE: Multidetector CT imaging of the pelvis was performed using the standard protocol following the bolus administration of intravenous contrast. CONTRAST:  182m OMNIPAQUE IOHEXOL 300 MG/ML  SOLN COMPARISON:  CT Abdomen and Pelvis 08/18/2015. FINDINGS: Urinary Tract: IV contrast is being excreted in both ureters to the urinary bladder. Questionable small anterior bladder wall diverticulum on series 3, image 24, but otherwise negative. Bowel: Oral contrast is present in the visible bowel to the rectum. The rectum is distended with contrast. No rectal wall thickening or perirectal inflammation is identified. The sigmoid colon  is decompressed and negative. Negative visible descending colon, cecum, appendix, and small bowel. Vascular/Lymphatic: Aortoiliac calcified atherosclerosis. No pelvic or inguinal lymphadenopathy. Reproductive: Several small metallic prostate probable brachytherapy seeds are redemonstrated and appear stable since 2017. Otherwise negative. Other:  No pelvic free fluid. Musculoskeletal: Lower lumbar facet arthropathy, severe on the left. No acute or suspicious osseous lesion. IMPRESSION: 1. Rectum and other visible large bowel loops appear within normal limits, no explanation for bright red blood. 2. Stable appearance of prostate brachytherapy since 2017. 3. Possible small bladder diverticulum, significance doubtful. Electronically  Signed   By: Genevie Ann M.D.   On: 09/25/2019 16:31      Subjective: Pt reports no complaints.  Tolerated colonoscopy.   Discharge Exam: Vitals:   10/23/19 1630 10/23/19 1634  BP:  136/84  Pulse: 74 70  Resp: (!) 24 (!) 21  Temp:    SpO2:     Vitals:   10/23/19 1440 10/23/19 1615 10/23/19 1630 10/23/19 1634  BP: (!) 142/79 123/86  136/84  Pulse: 74  74 70  Resp: 18  (!) 24 (!) 21  Temp: 97.8 F (36.6 C) 98.2 F (36.8 C)    TempSrc: Oral     SpO2: 100%     Weight:      Height:       General: Pt is alert, awake, not in acute distress Cardiovascular: RRR, S1/S2 +, no rubs, no gallops Respiratory: CTA bilaterally, no wheezing, no rhonchi Abdominal: Soft, NT, ND, bowel sounds + Extremities: no edema, no cyanosis   The results of significant diagnostics from this hospitalization (including imaging, microbiology, ancillary and laboratory) are listed below for reference.     Microbiology: Recent Results (from the past 240 hour(s))  SARS CORONAVIRUS 2 (TAT 6-24 HRS) Nasopharyngeal Nasopharyngeal Swab     Status: None   Collection Time: 10/21/19  1:08 PM   Specimen: Nasopharyngeal Swab  Result Value Ref Range Status   SARS Coronavirus 2 NEGATIVE NEGATIVE Final    Comment: (NOTE) SARS-CoV-2 target nucleic acids are NOT DETECTED. The SARS-CoV-2 RNA is generally detectable in upper and lower respiratory specimens during the acute phase of infection. Negative results do not preclude SARS-CoV-2 infection, do not rule out co-infections with other pathogens, and should not be used as the sole basis for treatment or other patient management decisions. Negative results must be combined with clinical observations, patient history, and epidemiological information. The expected result is Negative. Fact Sheet for Patients: SugarRoll.be Fact Sheet for Healthcare Providers: https://www.woods-mathews.com/ This test is not yet approved or cleared  by the [place] FDA and  has been authorized for detection and/or diagnosis of SARS-CoV-2 by FDA under an Emergency Use Authorization (EUA). This EUA will remain  in effect (meaning this test can be used) for the duration of the COVID-19 declaration under Section 56 4(b)(1) of the Act, 21 U.S.C. section 360bbb-3(b)(1), unless the authorization is terminated or revoked sooner. Performed at [facility], [place] [address], [place], [place] [address]      Labs: BNP (last 3 results) No results for input(s): BNP in the last 8760 hours. Basic Metabolic Panel: Recent Labs  Lab 10/22/19 0507 10/22/19 1420 10/23/19 0529  NA 139 139 142  K 2.8* 3.1* 3.1*  CL 103 102 108  CO2 '28 26 24  '$ GLUCOSE 100* 103* 103*  BUN 5* <5* <5*  CREATININE 0.80 0.82 0.90  CALCIUM 9.4 9.6 9.8  MG 1.9  --  2.0   Liver Function Tests: No  results for input(s): AST, ALT, ALKPHOS, BILITOT, PROT, ALBUMIN in the last 168 hours. No results for input(s): LIPASE, AMYLASE in the last 168 hours. No results for input(s): AMMONIA in the last 168 hours. CBC: Recent Labs  Lab 10/22/19 0507  WBC 5.0  HGB 13.3  HCT 40.6  MCV 88.5  PLT 241   Cardiac Enzymes: No results for input(s): CKTOTAL, CKMB, CKMBINDEX, TROPONINI in the last 168 hours. BNP: Invalid input(s): POCBNP CBG: No results for input(s): GLUCAP in the last 168 hours. D-Dimer No results for input(s): DDIMER in the last 72 hours. Hgb A1c No results for input(s): HGBA1C in the last 72 hours. Lipid Profile No results for input(s): CHOL, HDL, LDLCALC, TRIG, CHOLHDL, LDLDIRECT in the last 72 hours. Thyroid function studies Recent Labs    10/22/19 0507  TSH 0.282*   Anemia work up No results for input(s): VITAMINB12, FOLATE, FERRITIN, TIBC, IRON, RETICCTPCT in the last 72 hours. Urinalysis    Component Value Date/Time   COLORURINE STRAW (A) 04/17/2019 1913   APPEARANCEUR CLEAR 04/17/2019 1913   LABSPEC 1.003 (L) 04/17/2019 1913    PHURINE 7.0 04/17/2019 1913   GLUCOSEU NEGATIVE 04/17/2019 1913   HGBUR NEGATIVE 04/17/2019 [place] 04/17/2019 1913   KETONESUR NEGATIVE 04/17/2019 1913   PROTEINUR NEGATIVE 04/17/2019 1913   UROBILINOGEN 1.0 08/03/2012 1156   NITRITE NEGATIVE 04/17/2019 1913   LEUKOCYTESUR NEGATIVE 04/17/2019 1913   Sepsis Labs Invalid input(s): PROCALCITONIN,  WBC,  LACTICIDVEN Microbiology Recent Results (from the past 240 hour(s))  SARS CORONAVIRUS 2 (TAT 6-24 HRS) Nasopharyngeal Nasopharyngeal Swab     Status: None   Collection Time: 10/21/19  1:08 PM   Specimen: Nasopharyngeal Swab  Result Value Ref Range Status   SARS Coronavirus 2 NEGATIVE NEGATIVE Final    Comment: (NOTE) SARS-CoV-2 target nucleic acids are NOT DETECTED. The SARS-CoV-2 RNA is generally detectable in upper and lower respiratory specimens during the acute phase of infection. Negative results do not preclude SARS-CoV-2 infection, do not rule out co-infections with other pathogens, and should not be used as the sole basis for treatment or other patient management decisions. Negative results must be combined with clinical observations, patient history, and epidemiological information. The expected result is Negative. Fact Sheet for Patients: SugarRoll.be Fact Sheet for Healthcare Providers: https://www.woods-mathews.com/ This test is not yet approved or cleared by the [place] FDA and  has been authorized for detection and/or diagnosis of SARS-CoV-2 by FDA under an Emergency Use Authorization (EUA). This EUA will remain  in effect (meaning this test can be used) for the duration of the COVID-19 declaration under Section 56 4(b)(1) of the Act, 21 U.S.C. section 360bbb-3(b)(1), unless the authorization is terminated or revoked sooner. Performed at [facility], [place] [address] [place], [place] [address]    Time coordinating discharge:    SIGNED:  Irwin Brakeman, MD  [facility] 10/23/2019, 4:38 PM How to contact the [facility] Attending or Consulting provider [place] or covering provider during after hours [place], for this patient?  1. Check the care team in [facility] and look for a) attending/consulting [facility] provider listed and b) the [facility] team listed 2. Log into [email or web address] and use [place]'s universal password to access. If you do not have the password, please contact the hospital operator. 3. Locate the [facility] provider you are looking for under [facility] and page to a number that you can be directly reached. 4. If you still have difficulty reaching the  provider, please page the [facility] (Director on Call) for the Hospitalists listed on amion for assistance.

## 2019-10-23 NOTE — Transfer of Care (Signed)
Immediate Anesthesia Transfer of Care Note  Patient: Steven [name]  Procedure(s) Performed: COLONOSCOPY WITH PROPOFOL (N/A ) HOT HEMOSTASIS (ARGON PLASMA COAGULATION/BICAP)  Patient Location: PACU  Anesthesia Type:General  Level of Consciousness: awake, alert , oriented and sedated  Airway & Oxygen Therapy: Patient Spontanous Breathing and Patient connected to nasal cannula oxygen  Post-op Assessment: Report given to RN and Post -op Vital signs reviewed and stable  Post vital signs: Reviewed and stable  Last Vitals:  Vitals Value Taken Time  BP 123/86 10/23/19 1610  Temp 98.2   Pulse 89 10/23/19 1614  Resp 16 10/23/19 1614  SpO2 100 % 10/23/19 1614  Vitals shown include unvalidated device data.  Last Pain:  Vitals:   10/23/19 1440  TempSrc: Oral  PainSc: 0-No pain      Patients Stated Pain Goal: 6 ([id number])  Complications: No apparent anesthesia complications

## 2019-10-23 NOTE — Progress Notes (Signed)
Relative Robin Searing notified via telephone at [phone] and advised of patient request "Tell her to pick up my compound medication for my bottom at [facility] and bring it to me".  Robin Searing verbalized understanding.

## 2019-10-23 NOTE — Progress Notes (Signed)
Patient is complaining of left shoulder stiffness, ongoing x 1 week. Patient is asking for ben-gay cream. Dr Wynetta Emery notified via secure chat. Awaiting new orders.

## 2019-10-23 NOTE — Progress Notes (Signed)
Nsg Discharge Note  Admit Date:  10/21/2019 Discharge date: 10/23/2019   Steven [name] to be D/C'd Home per MD order.  AVS completed.  Copy for chart, and copy for patient signed, and dated. Removed IV-clean, dry, intact. Reviewed d/c paperwork with patient and cousin. Reviewed new meds and med changes. Had some questions I could not answer so I advised him to call Dr. Roseanne Kaufman office in the morning. Wheeled stable patient and belongings to main entrance where he was picked up by his cousin to d/c to home. Patient/caregiver able to verbalize understanding.  Discharge Medication: Allergies as of 10/23/2019   No Known Allergies     Medication List    STOP taking these medications   polyethylene glycol powder 17 GM/SCOOP powder Commonly known as: GLYCOLAX/MIRALAX   predniSONE 10 MG (21) Tbpk tablet Commonly known as: STERAPRED UNI-PAK 21 TAB   Suprep Bowel Prep Kit 17.5-3.13-1.6 GM/177ML Soln Generic drug: Na Sulfate-K Sulfate-Mg Sulf     TAKE these medications   amLODipine 10 MG tablet Commonly known as: NORVASC Take 1 tablet (10 mg total) by mouth daily.   apixaban 5 MG Tabs tablet Commonly known as: Eliquis Take 1 tablet (5 mg total) by mouth 2 (two) times daily. Start taking on: October 27, 2019 What changed: These instructions start on October 27, 2019. If you are unsure what to do until then, ask your doctor or other care provider.   B-12 PO Take 1 tablet by mouth daily.   diazepam 5 MG tablet Commonly known as: VALIUM Take 5 mg by mouth daily as needed for anxiety.   docusate sodium 100 MG capsule Commonly known as: COLACE Take 1 capsule (100 mg total) by mouth 2 (two) times daily.   gabapentin 300 MG capsule Commonly known as: NEURONTIN Take 300 mg by mouth 3 (three) times daily.   hydrochlorothiazide 25 MG tablet Commonly known as: HYDRODIURIL Take 25 mg by mouth daily.   hydrocortisone 2.5 % rectal cream Commonly known as: ANUSOL-HC Place 1 application rectally 4  (four) times daily.   hydrocortisone 25 MG suppository Commonly known as: ANUSOL-HC Place 1 suppository (25 mg total) rectally 2 (two) times daily as needed for hemorrhoids or anal itching.   Lidocaine (Anorectal) 5 % Gel Apply 1 application topically 3 (three) times daily as needed (rectal pain).   losartan 100 MG tablet Commonly known as: COZAAR Take 1 tablet (100 mg total) by mouth daily.   metoprolol tartrate 25 MG tablet Commonly known as: LOPRESSOR Take 1 tablet (25 mg total) by mouth 2 (two) times daily.   ondansetron 4 MG tablet Commonly known as: Zofran Take 1 tablet (4 mg total) by mouth every 8 (eight) hours as needed for nausea or vomiting.   pantoprazole 40 MG tablet Commonly known as: PROTONIX Take 1 tablet (40 mg total) by mouth daily. 30 minutes before breakfast   potassium chloride SA 20 MEQ tablet Commonly known as: KLOR-CON Take 1 tablet (20 mEq total) by mouth daily.   psyllium 58.6 % powder Commonly known as: METAMUCIL Take 1 packet by mouth daily.   rosuvastatin 10 MG tablet Commonly known as: CRESTOR Take 10 mg by mouth every morning.   topiramate 100 MG tablet Commonly known as: TOPAMAX Take 100 mg by mouth at bedtime.   traMADol 50 MG tablet Commonly known as: Ultram Take 1 tablet (50 mg total) by mouth every 6 (six) hours as needed.   vitamin C 500 MG tablet Commonly known as: ASCORBIC ACID  Take 500 mg by mouth daily.   Vitamin D3 125 MCG (5000 UT) Caps Take 1 capsule by mouth daily.   VITAMIN E PO Take 1 tablet by mouth daily.       Discharge Assessment: Vitals:   10/23/19 1652 10/23/19 1700  BP:  (!) 146/84  Pulse: 66 64  Resp: 12 15  Temp:    SpO2:  100%   Skin clean, dry and intact without evidence of skin break down, no evidence of skin tears noted. IV catheter discontinued intact. Site without signs and symptoms of complications - no redness or edema noted at insertion site, patient denies c/o pain - only slight  tenderness at site.  Dressing with slight pressure applied.  D/c Instructions-Education: Discharge instructions given to patient/family with verbalized understanding. D/c education completed with patient/family including follow up instructions, medication list, d/c activities limitations if indicated, with other d/c instructions as indicated by MD - patient able to verbalize understanding, all questions fully answered. Patient instructed to return to ED, call 911, or call MD for any changes in condition.  Patient escorted via [place], and D/C home via private auto.  Santa Lighter, RN 10/23/2019 8:04 PM

## 2019-10-23 NOTE — Anesthesia Postprocedure Evaluation (Signed)
Anesthesia Post Note  Patient: Steven [name]  Procedure(s) Performed: COLONOSCOPY WITH PROPOFOL (N/A ) HOT HEMOSTASIS (ARGON PLASMA COAGULATION/BICAP)  Patient location during evaluation: PACU Anesthesia Type: General Level of consciousness: awake and alert and oriented Pain management: pain level controlled Vital Signs Assessment: post-procedure vital signs reviewed and stable Respiratory status: spontaneous breathing and respiratory function stable Cardiovascular status: blood pressure returned to baseline Postop Assessment: no apparent nausea or vomiting Anesthetic complications: no     Last Vitals:  Vitals:   10/23/19 1645 10/23/19 1652  BP: 134/79   Pulse: 69 66  Resp: 20 12  Temp:    SpO2: 98%     Last Pain:  Vitals:   10/23/19 1645  TempSrc:   PainSc: 5                  Deiona Hooper C Schuyler Behan

## 2019-10-23 NOTE — Discharge Instructions (Signed)
Start Colace 100 mg twice daily for constipation and hemorrhoids.  Follow up with GI office in 6 weeks. START BACK ELIQUIS (apixaban) ON October 27, 2019.  ESTABLISH CARE WITH ENDOCRINOLOGY TO FOLLOW UP THYROID IN 2 WEEKS.      IMPORTANT INFORMATION: PAY CLOSE ATTENTION   PHYSICIAN DISCHARGE INSTRUCTIONS  Follow with Primary care provider  Jake Samples, PA-C  and other consultants as instructed by your Hospitalist Physician  [place] IF SYMPTOMS COME BACK, WORSEN OR NEW PROBLEM DEVELOPS   Please note: You were cared for by a hospitalist during your hospital stay. Every effort will be made to forward records to your primary care provider.  You can request that your primary care provider send for your hospital records if they have not received them.  Once you are discharged, your primary care physician will handle any further medical issues. Please note that NO REFILLS for any discharge medications will be authorized once you are discharged, as it is imperative that you return to your primary care physician (or establish a relationship with a primary care physician if you do not have one) for your post hospital discharge needs so that they can reassess your need for medications and monitor your lab values.  Please get a complete blood count and chemistry panel checked by your Primary MD at your next visit, and again as instructed by your Primary MD.  Get Medicines reviewed and adjusted: Please take all your medications with you for your next visit with your Primary MD  Laboratory/radiological data: Please request your Primary MD to go over all hospital tests and procedure/radiological results at the follow up, please ask your primary care provider to get all Hospital records sent to his/her office.  In some cases, they will be blood work, cultures and biopsy results pending at the time of your discharge. Please request that your primary care provider  follow up on these results.  If you are diabetic, please bring your blood sugar readings with you to your follow up appointment with primary care.    Please call and make your follow up appointments as soon as possible.    Also Note the following: If you experience worsening of your admission symptoms, develop shortness of breath, life threatening emergency, suicidal or homicidal thoughts you must seek medical attention immediately by calling 911 or calling your MD immediately  if symptoms less severe.  You must read complete instructions/literature along with all the possible adverse reactions/side effects for all the Medicines you take and that have been prescribed to you. Take any new Medicines after you have completely understood and accpet all the possible adverse reactions/side effects.   Do not drive when taking Pain medications or sleeping medications (Benzodiazepines)  Do not take more than prescribed Pain, Sleep and Anxiety Medications. It is not advisable to combine anxiety,sleep and pain medications without talking with your primary care practitioner  Special Instructions: If you have smoked or chewed Tobacco  in the last 2 yrs please stop smoking, stop any regular Alcohol  and or any Recreational drug use.  Wear Seat belts while driving.  Do not drive if taking any narcotic, mind altering or controlled substances or recreational drugs or alcohol.

## 2019-10-23 NOTE — Op Note (Signed)
[facility] Patient Name: Steven [name] Procedure Date: 10/23/2019 3:23 PM MRN: [id number] Date of Birth: 1950-10-01 Attending MD: Norvel Richards , MD CSN: [id number] Age: 69 Admit Type: Inpatient Procedure:                Colonoscopy Indications:              Hematochezia Providers:                Norvel Richards, MD, Gwynneth Albright RN,                            RN, Charlsie Quest Theda Sers RN, RN Referring MD:              Medicines:                Propofol per Anesthesia Complications:            No immediate complications. Estimated Blood Loss:     Estimated blood loss: none. Procedure:                Pre-Anesthesia Assessment:                           - Prior to the procedure, a History and Physical                            was performed, and patient medications and                            allergies were reviewed. The patient's tolerance of                            previous anesthesia was also reviewed. The risks                            and benefits of the procedure and the sedation                            options and risks were discussed with the patient.                            All questions were answered, and informed consent                            was obtained. Prior Anticoagulants: The patient                            last took Eliquis (apixaban) 3 days prior to the                            procedure. ASA Grade Assessment: III - A patient                            with severe systemic disease. After reviewing the  risks and benefits, the patient was deemed in                            satisfactory condition to undergo the procedure.                           After obtaining informed consent, the colonoscope                            was passed under direct vision. Throughout the                            procedure, the patient's blood pressure, pulse, and                            oxygen saturations were  monitored continuously. The                            [id number]) scope was introduced through                            the anus and advanced to the the cecum, identified                            by appendiceal orifice and ileocecal valve. The                            colonoscopy was performed without difficulty. The                            patient tolerated the procedure well. The quality                            of the bowel preparation was adequate. Scope In: 3:43:06 PM Scope Out: 4:01:48 PM Scope Withdrawal Time: 0 hours 11 minutes 55 seconds  Total Procedure Duration: 0 hours 18 minutes 42 seconds  Findings:      The perianal and digital rectal examinations were normal.      Non-bleeding external and internal hemorrhoids were found during       retroflexion. The hemorrhoids were moderate, medium-sized and Grade II       (internal hemorrhoids that prolapse but reduce spontaneously).      Multiple areas of superficial neovascular blood vessels clustered in the       3 o'clock position in the distal rectum. Please see photos. Marked       friability. Findings consistent with radiation proctitis. The remainder       of the colonic mucosa appeared normal aside from hemorrhoids. The prep       was marginal. Copious washing and lavage was required throughout the       colon to gain adequate visualization of the mucosal surfaces. Utilizing       the APC circular probe the area neovascular changes in the rectum or       ablated with multiple applications of APC at 40 W each. Hemostasis. No       apparent complications.  Impression:               - Non-bleeding external and internal hemorrhoids.                            Abnormal rectal blood vessels consistent with                            radiation proctitis. Status post APC treatment.                           - No specimens collected. Moderate Sedation:      Moderate (conscious) sedation was personally administered  by an       anesthesia professional. The following parameters were monitored: oxygen       saturation, heart rate, blood pressure, respiratory rate, EKG, adequacy       of pulmonary ventilation, and response to care. Recommendation:           - Patient has a contact number available for                            emergencies. The signs and symptoms of potential                            delayed complications were discussed with the                            patient. Return to normal activities tomorrow.                            Written discharge instructions were provided to the                            patient.                           - Return patient to hospital ward for possible                            discharge same day.                           -Heart healthy diet.                           - Continue present medications. Begin Colace 100 mg                            twice daily. Resume Eliquis April 5. Office visit                            with [place] in 6 weeks. From a GI standpoint, patient                            could be discharged later today. Hospitalist  assistance with management greatly appreciated. Procedure Code(s):        --- Professional ---                           [phone], Colonoscopy, flexible; diagnostic, including                            collection of specimen(s) by brushing or washing,                            when performed (separate procedure) Diagnosis Code(s):        --- Professional ---                           K55.20, Angiodysplasia of colon without hemorrhage                           K64.1, Second degree hemorrhoids                           K92.1, Melena (includes Hematochezia) CPT copyright 2019 American Medical Association. All rights reserved. The codes documented in this report are preliminary and upon coder review may  be revised to meet current compliance requirements. Cristopher Estimable. Fareed Fung, MD Norvel Richards, MD 10/23/2019 4:19:22 PM This report has been signed electronically. Number of Addenda: 0

## 2019-10-23 NOTE — Progress Notes (Signed)
Patient's potassium is still low at 3.1. I have ordered additional potassium IV 44meq X 2.   Laureen Ochs. Bernarda Caffey [facility] [phone] 4/1/20217:47 AM

## 2019-10-23 NOTE — H&P (Signed)
_0 @   Primary Care Physician:  Jake Samples, PA-C Primary Gastroenterologist:  Dr. Gala Romney  Pre-Procedure History & Physical: HPI:  Steven [name] is a 69 y.o. male here for here for further evaluation of proctalgia and rectal bleeding.  History history of radiation proctitis treated previously.   Potassium much improved at 3.1.  Gust with anesthesia.  No Eliquis since her 3/29.  Past Medical History:  Diagnosis Date  . Arthritis   . Atrial fibrillation ([place])   . Atrial flutter ([place])   . Cancer [facility])    prostate s/p radiation Mar 2013  . Chest pain   . Fracture 08/17/2015   MULTIPLE RIB FRACTURES     FROM FALL   . Hemorrhoids   . History of radiation therapy 08/22/11-10/13/11   prostate  . Hyperlipemia   . Hypertension     Past Surgical History:  Procedure Laterality Date  . COLONOSCOPY N/A 12/19/2012   HDQ:QIWLNL 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 Normal appearing colon  . [place]  . RADIOACTIVE SEED IMPLANT     Prostate    Prior to Admission medications   Medication Sig Start Date End Date Taking? Authorizing Provider  amLODipine (NORVASC) 10 MG tablet Take 1 tablet (10 mg total) by mouth daily. 04/27/19  Yes Pollina, Gwenyth Allegra, MD  apixaban (ELIQUIS) 5 MG TABS tablet Take 1 tablet (5 mg total) by mouth 2 (two) times daily. 09/19/19  Yes Josue Hector, MD  Cholecalciferol (VITAMIN D3) 125 MCG (5000 UT) CAPS Take 1 capsule by mouth daily.   Yes [provider]  Cyanocobalamin (B-12 PO) Take 1 tablet by mouth daily.   Yes [provider]  diazepam (VALIUM) 5 MG tablet Take 5 mg by mouth daily as needed for anxiety.  05/13/19  Yes [provider]  gabapentin (NEURONTIN) 300 MG capsule Take 300 mg by mouth 3 (three) times daily.   Yes [provider]  hydrochlorothiazide (HYDRODIURIL) 25 MG tablet Take 25 mg by mouth daily. 07/28/19  Yes [provider]  hydrocortisone (ANUSOL-HC) 2.5 % rectal cream Place 1 application rectally 4 (four) times daily. 08/19/19  Yes Annitta Needs, NP  hydrocortisone (ANUSOL-HC) 25 MG suppository Place 1 suppository (25 mg total) rectally 2 (two) times daily as needed for hemorrhoids or anal itching. 10/10/19  Yes [name] S, PA-C  Lidocaine, Anorectal, 5 % GEL Apply 1 application topically 3 (three) times daily as needed (rectal pain). 09/28/19  Yes Augusto Gamble B, NP  losartan (COZAAR) 100 MG tablet Take 1 tablet (100 mg total) by mouth daily. 04/19/19  Yes Emokpae, Courage, MD  metoprolol tartrate (LOPRESSOR) 25 MG tablet Take 1 tablet (25 mg total) by mouth 2 (two) times daily. 04/18/19  Yes Emokpae, Courage, MD  Na Sulfate-K Sulfate-Mg Sulf (SUPREP BOWEL PREP KIT) 17.5-3.13-1.6 GM/177ML SOLN Take 1 kit by mouth as directed. 09/04/19  Yes Anber Mckiver, Cristopher Estimable, MD  ondansetron (ZOFRAN) 4 MG tablet Take 1 tablet (4 mg total) by mouth every 8 (eight) hours as needed for nausea or vomiting. 10/05/19  Yes Avegno, Darrelyn Hillock, FNP  pantoprazole (PROTONIX) 40 MG tablet Take 1 tablet (40 mg total) by mouth daily. 30 minutes before breakfast 05/30/19  Yes Annitta Needs, NP  polyethylene glycol powder (GLYCOLAX/MIRALAX) 17 GM/SCOOP powder Take 17 g by mouth daily.  08/19/15  Yes [provider]  potassium chloride SA (KLOR-CON) 20 MEQ tablet Take 1  tablet (20 mEq total) by mouth daily. 04/27/19  Yes Pollina, Gwenyth Allegra, MD  psyllium (METAMUCIL) 58.6 % powder Take 1 packet by mouth daily.   Yes [provider]  rosuvastatin (CRESTOR) 10 MG tablet Take 10 mg by mouth every morning.  08/22/19  Yes [provider]  topiramate (TOPAMAX) 100 MG tablet Take 100 mg by mouth at bedtime.  09/04/19  Yes [provider]  vitamin C (ASCORBIC ACID) 500 MG tablet Take 500 mg by mouth daily.   Yes [provider]  VITAMIN E PO Take 1 tablet by mouth daily.   Yes [provider]   predniSONE (STERAPRED UNI-PAK 21 TAB) 10 MG (21) TBPK tablet Take 10-60 mg by mouth See admin instructions. follow package directions for 6 days starting on 09/30/2019 09/30/19   [provider]  traMADol (ULTRAM) 50 MG tablet Take 1 tablet (50 mg total) by mouth every 6 (six) hours as needed. 09/26/19 09/25/20  Erenest Rasher, PA-C    Allergies as of 10/20/2019  . (No Known Allergies)    Family History  Problem Relation Age of Onset  . Aneurysm Mother   . Prostate cancer Father   . Colon cancer Neg Hx   . Colon polyps Neg Hx     Social History   Socioeconomic History  . Marital status: Single    Spouse name: Not on file  . Number of children: Not on file  . Years of education: Not on file  . Highest education level: Not on file  Occupational History  . Occupation: Custodian     Employer: [employer]  Tobacco Use  . Smoking status: Former Smoker    Packs/day: 1.00    Years: 20.00    Pack years: 20.00    Types: Cigarettes    Quit date: 2020    Years since quitting: 1.2  . Smokeless tobacco: Former Systems developer  . Tobacco comment: Quit smoking x 2 years    07/2015   SOMETIIMES i USE VAPOR   Substance and Sexual Activity  . Alcohol use: Never  . Drug use: Never  . Sexual activity: Not on file  Other Topics Concern  . Not on file  Social History Narrative   Divorced   Social Determinants of Health   Financial Resource Strain:   . Difficulty of Paying Living Expenses:   Food Insecurity:   . Worried About Charity fundraiser in the Last Year:   . Arboriculturist in the Last Year:   Transportation Needs:   . Film/video editor (Medical):   Marland Kitchen Lack of Transportation (Non-Medical):   Physical Activity:   . Days of Exercise per Week:   . Minutes of Exercise per Session:   Stress:   . Feeling of Stress :   Social Connections:   . Frequency of Communication with Friends and Family:   . Frequency of Social Gatherings with Friends and Family:   . Attends  Religious Services:   . Active Member of Clubs or Organizations:   . Attends Archivist Meetings:   Marland Kitchen Marital Status:   Intimate Partner Violence:   . Fear of Current or Ex-Partner:   . Emotionally Abused:   Marland Kitchen Physically Abused:   . Sexually Abused:     Review of Systems: See HPI, otherwise negative ROS  Physical Exam: BP (!) 142/79   Pulse 74   Temp 97.8 F (36.6 C) (Oral)   Resp 18   Ht  _0  (1.676 m)   Wt 82 kg   SpO2 100%   BMI 29.18 kg/m  General:   Alert,  Well-developed, well-nourished, pleasant and cooperative in NAD Mouth:  No deformity or lesions. Neck:  Supple; no masses or thyromegaly. No significant cervical adenopathy. Lungs:  Clear throughout to auscultation.   No wheezes, crackles, or rhonchi. No acute distress. Heart:  Regular rate and rhythm; no murmurs, clicks, rubs,  or gallops. Abdomen: Non-distended, normal bowel sounds.  Soft and nontender without appreciable mass or hepatosplenomegaly.  Pulses:  Normal pulses noted. Extremities:  Without clubbing or edema.  Impression/Plan: 69 year old gentleman with proctalgia and intermittent rectal bleeding.  History of radiation proctitis.  Here for colonoscopy to further evaluate. The risks, benefits, limitations, alternatives and imponderables have been reviewed with the patient. Questions have been answered. All parties are agreeable.      Notice: This dictation was prepared with Dragon dictation along with smaller phrase technology. Any transcriptional errors that result from this process are unintentional and may not be corrected upon review.

## 2019-10-27 ENCOUNTER — Telehealth: Payer: Self-pay | Admitting: Internal Medicine

## 2019-10-27 NOTE — Telephone Encounter (Signed)
Called pt back. Pt is having c/o some rectal pain at a level 5 and pressure at his testicles that goes to the rectum. Pt said he was told he had an infection of the prostate when he had his procedure done.  Pt is wondering if antibiotics would speed up the healing and pain of is rectum. Pt states he has a BM 3-4 times a day and he still gets only a little bit out at a time. Pt is aware that results aren't available at this time.

## 2019-10-27 NOTE — Telephone Encounter (Signed)
Tried calling pt. Mailbox is full. Will call pt back. FYI [place]

## 2019-10-27 NOTE — Telephone Encounter (Signed)
Pt was returning call. I told him [place] was with patients and the nurse was at lunch and there's a phone note from this morning when he called and someone will call him back.

## 2019-10-27 NOTE — Telephone Encounter (Signed)
Pt called this morning asking for [facility]. I told him [facility] was seeing patients and I could take a message. He said it was very important that someone calls him back. He said he has the same symptoms as before prior to his colonoscopy, he was hurting and having problems with having a BM. He is requesting antibiotics. Please call him at [phone]

## 2019-10-27 NOTE — Addendum Note (Signed)
Addendum  created 10/27/19 0935 by Jonna Munro, CRNA   Charge Capture section accepted

## 2019-10-27 NOTE — Telephone Encounter (Signed)
He doesn't have an infection of the prostate. He changes in his rectum related to prior radiation which was called proctitis. I suspect this is what he is thinking about. No need for antibiotics. I am not sure that his rectal pain is secondary to the proctitis. He had been on Canasa suppositories in the past for this. Patient wasn't able to afford and we provided samples. Do we have any Canasa samples?   Additionally, he should be using the nifedipine cream prescribed by [facility] for an anal fissure. They are managing this. They had plans to follow-up in 2 months but noted he could be seen sooner if needed.   Regarding his constipation, Dr. Gala Romney recommended colace twice daily at the time of his colonoscopy.

## 2019-10-27 NOTE — Telephone Encounter (Signed)
Closing note out. Another phone note was sent to provider.

## 2019-10-28 NOTE — Telephone Encounter (Signed)
Spoke with pt. Pt is aware of Kh's recommendations. Pt is aware that previously he had proctitis. Pt was advised to used nifedipine cr prescribed by [facility]  and continue Colace bid as directed per RMR. Results from procedure will be given to pt.

## 2019-10-30 ENCOUNTER — Telehealth: Payer: Self-pay | Admitting: *Deleted

## 2019-10-30 NOTE — Telephone Encounter (Signed)
We can recheck his potassium. Please arrange BMP. We are going to need to regroup in the office for further evaluation/management of his lack of appetite. If he feels like he needs to be seen sooner than later, ok to schedule with the next available provider.   If he has significant weakness or feels like he will pass out he should proceed to the ED.

## 2019-10-30 NOTE — Telephone Encounter (Signed)
Patient called in requesting his colonoscopy results. Reports he feels he is getting worse instead of better. He is feeling weak, no appetite (reports has been like this for a while). He saw PCP but states they didn't do anything. He is wantingt o know if he should also have his potassium rechecked?

## 2019-10-31 NOTE — Telephone Encounter (Signed)
Communication noted.  

## 2019-10-31 NOTE — Telephone Encounter (Signed)
Lmom for pt to call me back. 

## 2019-11-03 ENCOUNTER — Other Ambulatory Visit: Payer: Self-pay

## 2019-11-03 ENCOUNTER — Other Ambulatory Visit: Payer: Self-pay | Admitting: Gastroenterology

## 2019-11-03 ENCOUNTER — Telehealth: Payer: Self-pay | Admitting: Internal Medicine

## 2019-11-03 DIAGNOSIS — Z79899 Other long term (current) drug therapy: Secondary | ICD-10-CM

## 2019-11-03 DIAGNOSIS — K6289 Other specified diseases of anus and rectum: Secondary | ICD-10-CM

## 2019-11-03 DIAGNOSIS — R531 Weakness: Secondary | ICD-10-CM

## 2019-11-03 DIAGNOSIS — K649 Unspecified hemorrhoids: Secondary | ICD-10-CM

## 2019-11-03 MED ORDER — HYDROCORTISONE (PERIANAL) 2.5 % EX CREA: 1.0000 "application " | TOPICAL_CREAM | Freq: Two times a day (BID) | CUTANEOUS | 2 refills | Status: DC

## 2019-11-03 NOTE — Telephone Encounter (Signed)
Pt said he needed a refill on his Ansol HC 2.5% rectal cream ASAP and he uses [facility]. He would like to get it today. [phone]

## 2019-11-03 NOTE — Telephone Encounter (Signed)
Management of his anal fissure will be deferred to [facility]. They prescribed nitroglycerine cream at his last OV. Was he able to get this refilled. If not, we can refill this, but he will need to contact [facility] for further recommendations.

## 2019-11-03 NOTE — Addendum Note (Signed)
Addended by: Derrick Ravel on: [id number] AM   Modules accepted: Orders

## 2019-11-03 NOTE — Telephone Encounter (Signed)
Routing message. [place], I left pt a detailed message about his topical cr. I also explained to pt earlier to contact [place] [facility] for refills. Pt is asking for an Anusol refill.

## 2019-11-03 NOTE — Telephone Encounter (Signed)
Noted  

## 2019-11-03 NOTE — Telephone Encounter (Signed)
Pt returned call from Friday this morning at 8:00 AM. Pt had several concerns and feelings of his body breaking down. Pt is aware that per [facility], it's ok for lab orders to be placed to check a BMP & potassium. Orders placed. Pt is still concerned with his change of appetite and was scheduled with [place] on 11/06/19 @ 8:00 AM. Pt wanted to see [facility] or [place]. [facility] currently has nothing available this week. Pt states the pharmacy contacted our office and [facility] for a refill of his medication. Pt pt says he is completely out of cream and it's the only thing that helps him. Pt would like to know if there is anything else our office and [facility] can do to treat the fissure or rectal pain he is having. Pt states his body is breaking completely down. Pt spoke with a friend and they mentioned to him another cream called Anamanthle HC Rectal Gel. Pt is aware that the appetite will be discussed at this appointment.

## 2019-11-03 NOTE — Telephone Encounter (Signed)
Left a detailed message for pt. Pt notified of [place] recommendations. If pt isn't able to get a refill, pt can call our office back for one refill.

## 2019-11-03 NOTE — Telephone Encounter (Signed)
Anusol cream refilled. Rx sent to [facility]

## 2019-11-04 LAB — POTASSIUM: Potassium: 3.1 mmol/L — ABNORMAL LOW (ref 3.5–5.3)

## 2019-11-05 ENCOUNTER — Other Ambulatory Visit: Payer: Self-pay | Admitting: Gastroenterology

## 2019-11-05 DIAGNOSIS — E876 Hypokalemia: Secondary | ICD-10-CM

## 2019-11-05 MED ORDER — POTASSIUM CHLORIDE ER 10 MEQ PO TBCR: 40.0000 meq | EXTENDED_RELEASE_TABLET | Freq: Every day | ORAL | 0 refills | Status: DC

## 2019-11-05 NOTE — Progress Notes (Signed)
Referring Provider: Jake Samples, PA* Primary Care Physician:  Jake Samples, PA-C Primary GI Physician: Dr. Gala Romney  Chief Complaint  Patient presents with  . Rectal Bleeding  . Weakness  . Rectal Pain    HPI:   Steven [name] is a 69 y.o. male presenting today with a history of GERD, constipation, radiation proctitis, and rectal pain. Most recently, patient has been struggling with ongoing rectal pain unrelieved by topical therapy. Had consult with [place] [facility] in March 2021 reporting anterior midline anal fissure with plans for nifedipine 4 times daily x6-8 weeks continuing at least 4 weeks post resolution of symptoms. Recommended discontinuing all foreign bodies per anus and pursuing colonoscopy. Colonoscopy 10/23/19 with external and grade 2 internal hemorrhoids, abnormal rectal blood vessels consistent with radiation proctitis s/p APC therapy, otherwise normal exam. Prep was marginal. Advised to begin Colace 100 mg twice daily. Of note, while hospitalized for colon prep, potassium was noted to be 2.8. This was supplemented. Recent recheck on 4/1 with K 3.1(L), 40 mEq has been ordered x 3 days then he will continue his daily supplementation of 20 mEq.   He presents today for follow-up of constipation, rectal pain, rectal bleeding and concerns for weight loss.   Constipation/rectal pain: 2 BMs this morning. Stools are hard to pass. Come out thin/small.  [place] size. Has to push them out. At one time rectal pain had started to subside but he got constipated last week after stopping MiraLAX for a few days and rectal pain returned.  Feels like a knife.  If he passes gas he also has burning/pain.  Continues to use nitroglycerin cream 4 times a day.  Taking Benefiber, eating regimen, and drinking prune juice for constipation.  Started MiraLAX back this morning.  Not taking Colace as this made him nauseated.  When he called to request a refill on his nitroglycerin  ointment from [facility] surgery, he did not ask for an earlier appointment to follow-up on his rectal pain. Occasional rectal bleeding when wiping. Mininmal amount in the stool on occasion.  Rectal bleeding is not with every BM. Occurs a couple times a week.   Weight Loss:  Weights had been fluctuating around 220lbs but began to decline after October 2020. 219 lbs October 2020, 203 lbs 09/15/2019 and 185 lbs today.    Minimal nausea.  This has improved/essentially resolved.  Had pain in his lower abdomen 2 weeks ago. Thinks he may have been constipated. This has resolved. No vomiting.  States he is not eating any solid foods due to IBS.  Rarely eating meats. Few vegetables. Cereal for breakfast. Doesn't eat lunch. Appetite is decreased due to pain in his rectum. Vegetables for dinner.  He has decreased his intake over the last several months due to "trying to eat the right things for my IBS."  States he cannot figure out what he is supposed to do.  Has decreased his intake over the last several months.  Doing this because he is trying to help his symptoms.   At night he feels his heart is beating fast or skipping a beat. States he can hear his pulse. Will have to sit himself up.  States he tried to see PCP last week.. Had to go as a walk in but wasn't able to be seen because it was so busy. Has intermittent "little pains" in his chest that do not last long. Feels like he is panting when his heart is beating fast.  No cold or flu like symptoms.  Intermittent leg cramping.  Mild weakness. Lightheadedness has improved.  States he has upcoming appointment with endocrinology regarding his thyroid dysfunction.   Past Medical History:  Diagnosis Date  . Arthritis   . Atrial fibrillation ([place])   . Atrial flutter ([place])   . Cancer [facility])    prostate s/p radiation Mar 2013  . Chest pain   . Fracture 08/17/2015   MULTIPLE RIB FRACTURES     FROM FALL   . Hemorrhoids   . History of radiation therapy  08/22/11-10/13/11   prostate  . Hyperlipemia   . Hypertension     Past Surgical History:  Procedure Laterality Date  . 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 Normal appearing colon  . COLONOSCOPY WITH PROPOFOL N/A 10/23/2019   Procedure: COLONOSCOPY WITH PROPOFOL;  Surgeon: Daneil Dolin, MD; external and grade 2 internal hemorrhoids, abnormal rectal blood vessels consistent with radiation proctitis s/p APC therapy, otherwise normal exam.  . HOT HEMOSTASIS  10/23/2019   Procedure: HOT HEMOSTASIS (ARGON PLASMA COAGULATION/BICAP);  Surgeon: Daneil Dolin, MD;  Location: [facility] END[facility] SUITE;  Service: Endoscopy;;  apc rectal proctitis    . [place]  . RADIOACTIVE SEED IMPLANT     Prostate    Current Outpatient Medications  Medication Sig Dispense Refill  . amLODipine (NORVASC) 10 MG tablet Take 1 tablet (10 mg total) by mouth daily. 30 tablet 6  . apixaban (ELIQUIS) 5 MG TABS tablet Take 1 tablet (5 mg total) by mouth 2 (two) times daily. 60 tablet 5  . Cholecalciferol (VITAMIN D3) 125 MCG (5000 UT) CAPS Take 1 capsule by mouth daily.    . Cyanocobalamin (B-12 PO) Take 1 tablet by mouth daily.    . diazepam (VALIUM) 5 MG tablet Take 5 mg by mouth daily as needed for anxiety.     . gabapentin (NEURONTIN) 300 MG capsule Take 300 mg by mouth 3 (three) times daily.    . hydrochlorothiazide (HYDRODIURIL) 25 MG tablet Take 25 mg by mouth daily.    . hydrocortisone (ANUSOL-HC) 2.5 % rectal cream Place 1 application rectally 2 (two) times daily. 30 g 2  . Lidocaine, Anorectal, 5 % GEL Apply 1 application topically 3 (three) times daily as needed (rectal pain). 113 g 0  . losartan (COZAAR) 100 MG tablet Take 1 tablet (100 mg total) by mouth daily. 30 tablet 4  . metoprolol tartrate (LOPRESSOR) 25 MG tablet Take 1 tablet (25 mg total) by mouth 2 (two) times daily. 60 tablet 2  . ondansetron  (ZOFRAN) 4 MG tablet Take 1 tablet (4 mg total) by mouth every 8 (eight) hours as needed for nausea or vomiting. 20 tablet 0  . pantoprazole (PROTONIX) 40 MG tablet Take 1 tablet (40 mg total) by mouth daily. 30 minutes before breakfast 30 tablet 3  . potassium chloride (KLOR-CON) 10 MEQ tablet Take 4 tablets (40 mEq total) by mouth daily. 12 tablet 0  . potassium chloride SA (KLOR-CON) 20 MEQ tablet Take 1 tablet (20 mEq total) by mouth daily. 30 tablet 0  . psyllium (METAMUCIL) 58.6 % powder Take 1 packet by mouth daily.    . rosuvastatin (CRESTOR) 10 MG tablet Take 10 mg by mouth every morning.     . topiramate (TOPAMAX) 100 MG tablet Take 100 mg by mouth at bedtime.     . traMADol (ULTRAM) 50 MG  tablet Take 1 tablet (50 mg total) by mouth every 6 (six) hours as needed. 15 tablet 0  . vitamin C (ASCORBIC ACID) 500 MG tablet Take 500 mg by mouth daily.    Marland Kitchen VITAMIN E PO Take 1 tablet by mouth daily.     No current facility-administered medications for this visit.    Allergies as of 11/06/2019  . (No Known Allergies)    Family History  Problem Relation Age of Onset  . Aneurysm Mother   . Prostate cancer Father   . Colon cancer Neg Hx   . Colon polyps Neg Hx     Social History   Socioeconomic History  . Marital status: Single    Spouse name: Not on file  . Number of children: Not on file  . Years of education: Not on file  . Highest education level: Not on file  Occupational History  . Occupation: Custodian     Employer: [employer]  Tobacco Use  . Smoking status: Former Smoker    Packs/day: 1.00    Years: 20.00    Pack years: 20.00    Types: Cigarettes    Quit date: 2020    Years since quitting: 1.2  . Smokeless tobacco: Former Systems developer  . Tobacco comment: Quit smoking x 2 years    07/2015   SOMETIIMES i USE VAPOR   Substance and Sexual Activity  . Alcohol use: Never  . Drug use: Never  . Sexual activity: Not Currently  Other Topics Concern  . Not on file    Social History Narrative   Divorced   Social Determinants of Health   Financial Resource Strain:   . Difficulty of Paying Living Expenses:   Food Insecurity:   . Worried About Charity fundraiser in the Last Year:   . Arboriculturist in the Last Year:   Transportation Needs:   . Film/video editor (Medical):   Marland Kitchen Lack of Transportation (Non-Medical):   Physical Activity:   . Days of Exercise per Week:   . Minutes of Exercise per Session:   Stress:   . Feeling of Stress :   Social Connections:   . Frequency of Communication with Friends and Family:   . Frequency of Social Gatherings with Friends and Family:   . Attends Religious Services:   . Active Member of Clubs or Organizations:   . Attends Archivist Meetings:   Marland Kitchen Marital Status:     Review of Systems: Gen: Denies fever, chills, cold or flulike symptoms. CV: See HPI Resp: See HPI GI: See HPI  Heme: See HPI  Physical Exam: BP 140/84   Pulse 97   Temp (!) 97.1 F (36.2 C) (Oral)   Ht 5\' 6"  (1.676 m)   Wt 185 lb 6.4 oz (84.1 kg)   BMI 29.92 kg/m  General:   Alert and oriented.  Laying on exam table.  No acute distress noted.   Head:  Normocephalic and atraumatic. Eyes:  Conjuctiva clear without scleral icterus. Heart:  S1, S2 present without murmurs appreciated. Lungs:  Clear to auscultation bilaterally. No wheezes, rales, or rhonchi. No distress.  Abdomen:  +BS, soft, non-tender and non-distended. No rebound or guarding. No HSM or masses noted. Msk:  Symmetrical without gross deformities. Normal posture. Extremities:  Without edema. Neurologic:  Alert and  oriented x4 Psych:  Normal mood and affect.  Total time spent: 45 min.

## 2019-11-05 NOTE — Progress Notes (Signed)
Potassium sent to [facility]

## 2019-11-06 ENCOUNTER — Encounter: Payer: Self-pay | Admitting: Gastroenterology

## 2019-11-06 ENCOUNTER — Other Ambulatory Visit: Payer: Self-pay | Admitting: Gastroenterology

## 2019-11-06 ENCOUNTER — Telehealth: Payer: Self-pay | Admitting: Gastroenterology

## 2019-11-06 ENCOUNTER — Ambulatory Visit (INDEPENDENT_AMBULATORY_CARE_PROVIDER_SITE_OTHER): Payer: BC Managed Care – PPO | Admitting: Gastroenterology

## 2019-11-06 ENCOUNTER — Other Ambulatory Visit: Payer: Self-pay

## 2019-11-06 ENCOUNTER — Emergency Department (HOSPITAL_COMMUNITY)
Admission: EM | Admit: 2019-11-06 | Discharge: 2019-11-06 | Disposition: A | Discharge status: Home / Self Care | Payer: BC Managed Care – PPO | Attending: Emergency Medicine | Admitting: Emergency Medicine

## 2019-11-06 ENCOUNTER — Telehealth: Payer: Self-pay | Admitting: Internal Medicine

## 2019-11-06 ENCOUNTER — Encounter (HOSPITAL_COMMUNITY): Payer: Self-pay

## 2019-11-06 VITALS — BP 140/84 | HR 97 | Temp 97.1°F | Ht 66.0 in | Wt 185.4 lb

## 2019-11-06 DIAGNOSIS — R634 Abnormal weight loss: Secondary | ICD-10-CM | POA: Diagnosis not present

## 2019-11-06 DIAGNOSIS — K649 Unspecified hemorrhoids: Secondary | ICD-10-CM | POA: Insufficient documentation

## 2019-11-06 DIAGNOSIS — K6289 Other specified diseases of anus and rectum: Secondary | ICD-10-CM | POA: Diagnosis not present

## 2019-11-06 DIAGNOSIS — Z5321 Procedure and treatment not carried out due to patient leaving prior to being seen by health care provider: Secondary | ICD-10-CM | POA: Diagnosis not present

## 2019-11-06 DIAGNOSIS — E876 Hypokalemia: Secondary | ICD-10-CM

## 2019-11-06 DIAGNOSIS — K625 Hemorrhage of anus and rectum: Secondary | ICD-10-CM

## 2019-11-06 DIAGNOSIS — K5900 Constipation, unspecified: Secondary | ICD-10-CM

## 2019-11-06 NOTE — ED Triage Notes (Signed)
Pt arrives via POV c/o hemorrhoids that bleed worse sometimes then other when he has a bowel movement. Pt was seen recently for this problem and is being followed [facility]. Pt reports using sitz baths, hemorrhoid ointment without any relief from pain and soreness.

## 2019-11-06 NOTE — Assessment & Plan Note (Signed)
Chronic constipation.  Linzess at its lowest dose was too strong.  Failed Amitiza 8 mcg.  Colace caused nausea.  Has been doing well with Benefiber and MiraLAX daily most recently.  Unfortunately, patient is not always compliant with our recommendations and decided to stop MiraLAX on his own which resulted in return of constipation and also worsening of his chronic rectal pain with known anal fissure and hemorrhoids.  Additionally, he reports having to strain with bowel movements and that his stools are thin in caliber.  Suspect this is likely secondary to his anal fissure and possible spasm of the anal sphincter.  Colonoscopy up-to-date on 10/23/2019 with external and grade 2 internal hemorrhoids, abnormal rectal blood vessels consistent with radiation proctitis s/p APC therapy, otherwise normal exam.  We discussed extensively today the importance of continuing MiraLAX every day and preventing constipation from returning in order for his anal fissure to heal.  Resume MiraLAX 1 capful (17 g) in 8 ounces of water every day.  May increase it to twice daily if needed. Take Benefiber every day. Drink 64 ounces of water daily. Limit toilet time 2-3 minutes. Use Tucks pads when wiping. Continue using Anusol cream twice daily as needed. Continue using nifedipine 4 times daily for anal fissure as prescribed by [facility] surgery. Follow-up in 2 months.

## 2019-11-06 NOTE — Assessment & Plan Note (Signed)
Intermittent toilet tissue hematochezia and occasional blood in the stool in the setting of constipation, hemorrhoids, and anal fissure.  Colonoscopy up-to-date on 10/23/2019 with external and grade 2 internal hemorrhoids, abnormal rectal blood vessels consistent with radiation proctitis s/p APC therapy, otherwise normal exam.  CBC 10/22/2019 with hemoglobin 13.3.  Discussed the importance of preventing constipation in order to help prevent and limit rectal bleeding.  Hemorrhoid banding would not be appropriate at this time due to significant rectal pain likely secondary to unhealed anal fissure as discussed below for which she is following with [place] [facility].  Continue using Anusol cream twice daily as needed. Continue using nifedipine 4 times daily as prescribed by [facility] surgery. Limit toilet time to 2-3 minutes. Use Tucks pads when wiping. Resume taking MiraLAX 1 capful (17 g) in 8 ounces of water daily.  May increase to twice daily if needed. Take Benefiber every day. Follow-up in 2 months.

## 2019-11-06 NOTE — ED Notes (Signed)
Pt called this nurse into room and stated "I need to go, I have to have a bm and I will need to get into the bath after to help with the pain."

## 2019-11-06 NOTE — Telephone Encounter (Signed)
Spoke with pt. Pt is ware of how to take potassium tabs. They are 10 meq tabs and pt will take 4 pills x 3 days only and resume back to his regular potassium RX.

## 2019-11-06 NOTE — Patient Instructions (Addendum)
Please pick up your prescription for potassium medicine yesterday.  You should take 40 mEq for the next 3 days and continue your daily dose of 20 mEq daily.  Have CT completed   Have your labs rechecked on Monday.  Please follow-up with your PCP ASAP regarding your abnormal heart rate and low potassium.  Please call Dr. Orest Dikes office with [facility] to make an earlier appointment regarding your rectal pain.  Keep your upcoming appointment with endocrinology to evaluate your thyroid dysfunction.  Continue using nifedipine 4 times daily.  Continue using Anusol cream twice daily.  Take Benefiber every day.  Use MiraLAX 1 capful (see 17 g) in 8 ounces of water every day.  If you need to take this twice a day you can.  Drink 64 ounces of water daily.  Limit toilet time to 2-3 minutes.  Use Tucks pads when wiping.  Please work on increasing your daily oral intake to have a more balanced diet.  There is no need to restrict your diet. Be sure you are eating 3 meals a day. Add meats back in to your diet. Eat lean meats that are baked, boiled, or broiled.  Drink 2 ensure or boost daily.   We will see you back in 2 months.   [name], PA-C [facility]

## 2019-11-06 NOTE — Assessment & Plan Note (Addendum)
Chronic rectal pain present since November 2020 that has been unrelieved by topical therapy.  He had consult with [place] [facility] in March 2021 reporting anterior midline anal fissure with plans for nifedipine 4 times daily x6-8 weeks with plans to continue least 4 weeks post resolution of symptoms.  Recommended to discontinue all foreign bodies per anus and pursuing colonoscopy.  Colonoscopy 10/23/2019 with external grade 2 internal hemorrhoids, abnormal rectal blood vessels consistent with radiation proctitis s/p APC therapy, otherwise normal exam.  Most recently, patient reports he started to have improvement in his rectal pain but had acute worsening last week in the setting of constipation as he stopped taking MiraLAX for a few days.  Currently with knifelike pain with BMs.  Unfortunately, patient is not very compliant with his bowel regimen and tends to take what ever he feels is best at the time rather than following our recommendations.   Suspect his rectal pain is secondary to anal fissure that continues to be exacerbated in the setting of intermittent constipation.  Hemorrhoids may be playing a role but suspect majority of his pain is coming from the unhealed fissure.  Discussed extensively with patient today that it is important that he does not stop taking MiraLAX as we have to prevent constipation in order for his fissure to heal.  Recommend he call Dr. Orest Dikes office for [place] [facility] to make an earlier appointment regarding his rectal pain. Continue using topical nifedipine 4 times daily as prescribed by [facility] surgery. Continue Anusol cream twice daily as needed. Take Benefiber every day. Take MiraLAX 1 capful (17 g) in 8 ounces of water every day.  May take this twice daily if needed. Drink 64 ounces of water daily. Limit toilet time 2-3 minutes. Use Tucks pads when wiping. Follow-up in 2 months.

## 2019-11-06 NOTE — Assessment & Plan Note (Signed)
Hypokalemia first noted during hospitalization for bowel prep with potassium 2.8.  This was supplemented in the hospital and potassium came up to 3.1.  Most recent recheck on 11/04/2019 with potassium again 3.1.  He has been prescribed potassium 40 mEq x 3 days and then will resume his usual 20 mEq dosing.  Plans to update BMP on 11/10/2019.  Otherwise, he was advised to follow-up with his PCP.

## 2019-11-06 NOTE — Assessment & Plan Note (Signed)
Weight loss of 34 pounds in the last 6 months.  Letter talking with patient prior today, I suspect this may be related to intentional decrease in oral intake secondary to patient "trying to eat the right things for my IBS."  Patient is hyper focused on all of his GI symptoms and states he has not been eating many solid foods because he does not know what he should eat.  This has been going on over the last several months.  Additionally, new diagnosis of hyperthyroidism for which she has not seen endocrinology yet which may also be playing a role. However, considering significant weight loss and history of prostate cancer, I would like to go ahead and update CT abdomen and pelvis to ensure we are not missing an occult malignancy.  CT pelvis in March 2021 without any significant findings.  Colonoscopy up-to-date on 10/23/2019 with external and grade 2 internal hemorrhoids, radiation proctitis s/p APC therapy, otherwise normal exam.  Proceed with CT abdomen pelvis with contrast. Discussed the the fact that there is no need to restrict his diet.  He was advised to increase his daily oral intake to have a more balanced diet.   Eat at least 3 meals a day. Add lean meats back into diet that are baked, boiled, or broiled.  Drink 2 ensure or boost daily.  Follow-up in 2 months.

## 2019-11-06 NOTE — Telephone Encounter (Signed)
On patient encounter form from office visit 11/06/2019, I had requested for a CT abdomen with contrast be arranged for weight loss.  I do not see that this has been done.  Just wanted to double check on this.

## 2019-11-06 NOTE — Telephone Encounter (Signed)
Pt called because he is confused about how to take his Potassium. Please call him at [phone]

## 2019-11-07 ENCOUNTER — Emergency Department (HOSPITAL_COMMUNITY)
Admission: EM | Admit: 2019-11-07 | Discharge: 2019-11-08 | Disposition: A | Discharge status: Home / Self Care | Payer: BC Managed Care – PPO | Attending: Emergency Medicine | Admitting: Emergency Medicine

## 2019-11-07 ENCOUNTER — Encounter (HOSPITAL_COMMUNITY): Payer: Self-pay | Admitting: Pediatrics

## 2019-11-07 ENCOUNTER — Emergency Department (HOSPITAL_COMMUNITY): Payer: BC Managed Care – PPO

## 2019-11-07 ENCOUNTER — Other Ambulatory Visit: Payer: Self-pay

## 2019-11-07 DIAGNOSIS — K5900 Constipation, unspecified: Secondary | ICD-10-CM | POA: Diagnosis not present

## 2019-11-07 DIAGNOSIS — K649 Unspecified hemorrhoids: Secondary | ICD-10-CM | POA: Insufficient documentation

## 2019-11-07 DIAGNOSIS — K589 Irritable bowel syndrome without diarrhea: Secondary | ICD-10-CM | POA: Diagnosis not present

## 2019-11-07 DIAGNOSIS — Z8546 Personal history of malignant neoplasm of prostate: Secondary | ICD-10-CM | POA: Diagnosis not present

## 2019-11-07 DIAGNOSIS — Z923 Personal history of irradiation: Secondary | ICD-10-CM | POA: Insufficient documentation

## 2019-11-07 NOTE — Addendum Note (Signed)
Addended by: Hassan Rowan on: 11/07/2019 08:04 AM   Modules accepted: Orders

## 2019-11-07 NOTE — ED Triage Notes (Signed)
C/o hemorrhoids and unable to have a bowel movement. Stated he had 6 bowel movements yesterday. Stated he was recently seen at [facility] and had a colonoscopy and it looked good.

## 2019-11-07 NOTE — Telephone Encounter (Signed)
CT abdomen w/contrast scheduled for 11/25/19 at 8:00am, arrive at 7:45am. NPO 4 hours prior to test. Pickup contrast before day of test.  Tried to call pt, no answer, left detailed message on VM to inform him of CT appt and instructions. Letter mailed.

## 2019-11-07 NOTE — Telephone Encounter (Signed)
PA for CT submitted via [employer] website. CT approved. Order ID: [id number], valid 11/07/19-05/04/20.

## 2019-11-08 NOTE — ED Provider Notes (Signed)
Emergency Department Provider Note   I have reviewed the triage vital signs and the nursing notes.   HISTORY  Chief Complaint Constipation and Hemorrhoids   HPI AKASHDEEP TRIMNAL is a 69 y.o. male with multiple medical problems as document below who presents the emerge department today for hemorrhoids.  Patient's been seen multiple times in the emergency room, urgent care and by gastroenterology for hemorrhoids.  Patient states he had some decreased bowel movements a day after having a significant amount of bowel movements.  He presented here because he thought he was developing obstruction.  He had to evaluate well in waiting room.  Patient feels better now.  Does have soreness of his hemorrhoids and rectum.  Patient defers further examination along with request to be discharged secondary to resolution of symptoms.   No other associated or modifying symptoms.    Past Medical History:  Diagnosis Date  . Arthritis   . Atrial fibrillation ([place])   . Atrial flutter ([place])   . Cancer [facility])    prostate s/p radiation Mar 2013  . Chest pain   . Fracture 08/17/2015   MULTIPLE RIB FRACTURES     FROM FALL   . Hemorrhoids   . History of radiation therapy 08/22/11-10/13/11   prostate  . Hyperlipemia   . Hypertension     Patient Active Problem List   Diagnosis Date Noted  . Weight loss 10/21/2019  . IBS (irritable bowel syndrome) 10/21/2019  . Rectal pain 08/21/2019  . Nausea without vomiting 05/30/2019  . Atrial flutter ([place]) 04/17/2019  . Atrial flutter, paroxysmal ([place]) 03/02/2019  . HTN (hypertension) 03/02/2019  . Elevated troponin 03/01/2019  . Hypokalemia 03/01/2019  . Constipation 08/02/2017  . Hemorrhoids 08/02/2017  . Fall 08/18/2015  . Open fracture of left elbow 08/18/2015  . Rib fractures 08/17/2015  . Radiation proctitis 01/30/2013  . Rectal bleeding 12/10/2012  . Chest pain 05/30/2012  . Elevated lipids 05/30/2012  . Palpitations 05/30/2012  . Dyspnea 05/30/2012   . Obese 05/30/2012  . Prostate cancer ([place]) 08/13/2011    Past Surgical History:  Procedure Laterality Date  . 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 Normal appearing colon  . COLONOSCOPY WITH PROPOFOL N/A 10/23/2019   Procedure: COLONOSCOPY WITH PROPOFOL;  Surgeon: Daneil Dolin, MD; external and grade 2 internal hemorrhoids, abnormal rectal blood vessels consistent with radiation proctitis s/p APC therapy, otherwise normal exam.  . HOT HEMOSTASIS  10/23/2019   Procedure: HOT HEMOSTASIS (ARGON PLASMA COAGULATION/BICAP);  Surgeon: Daneil Dolin, MD;  Location: [facility] ENDO SUITE;  Service: Endoscopy;;  apc rectal proctitis    . [place]  . RADIOACTIVE SEED IMPLANT     Prostate    Current Outpatient Rx  . Order #: [id number] Class: Normal  . Order #: [id number] Class: No Print  . Order #: [id number] Class: Historical Med  . Order #: [id number] Class: Historical Med  . Order #: [id number] Class: Historical Med  . Order #: [id number] Class: Historical Med  . Order #: [id number] Class: Historical Med  . Order #: [id number] Class: Normal  . Order #: [id number] Class: Normal  . Order #: [id number] Class: Normal  . Order #: [id number] Class: Normal  . Order #: [id number] Class: Normal  . Order #: [id number] Class: Normal  . Order #: [id number] Class: Normal  . Order #: [id number] Class: Normal  . Order #: [id number] Class: Historical Med  . Order #: [id number] Class: Historical Med  . Order #: [id number] Class: Historical Med  .  Order #: [id number] Class: Print  . Order #: [id number] Class: Historical Med  . Order #: [id number] Class: Historical Med    Allergies Patient has no known allergies.  Family History  Problem Relation Age of Onset  . Aneurysm Mother   . Prostate cancer Father   . Colon cancer Neg Hx   . Colon polyps Neg Hx     Social History Social History   Tobacco Use  . Smoking status:  Former Smoker    Packs/day: 1.00    Years: 20.00    Pack years: 20.00    Types: Cigarettes    Quit date: 2020    Years since quitting: 1.2  . Smokeless tobacco: Former Systems developer  . Tobacco comment: Quit smoking x 2 years    07/2015   SOMETIIMES i USE VAPOR   Substance Use Topics  . Alcohol use: Never  . Drug use: Never    Review of Systems  All other systems negative except as documented in the HPI. All pertinent positives and negatives as reviewed in the HPI. ____________________________________________   PHYSICAL EXAM:  VITAL SIGNS: ED Triage Vitals  Enc Vitals Group     BP 11/07/19 1628 132/83     Pulse Rate 11/07/19 1628 75     Resp 11/07/19 1628 16     Temp 11/07/19 1628 98.6 F (37 C)     Temp Source 11/07/19 1628 Oral     SpO2 11/07/19 1628 98 %     Weight 11/07/19 1628 185 lb (83.9 kg)     Height 11/07/19 1628 5\' 6"  (1.676 m)     Head Circumference --      Peak Flow --      Pain Score 11/07/19 1639 4     Pain Loc --      Pain Edu? --      Excl. in [place]? --     Constitutional: Alert and oriented. Well appearing and in no acute distress. Eyes: Conjunctivae are normal. PERRL. EOMI. Head: Atraumatic. Nose: No congestion/rhinnorhea. Mouth/Throat: Mucous membranes are moist.  Oropharynx non-erythematous. Neck: No stridor.  No meningeal signs.   Cardiovascular: Normal rate, regular rhythm. Good peripheral circulation. Grossly normal heart sounds.   Respiratory: Normal respiratory effort.  No retractions. Lungs CTAB. Gastrointestinal: Soft and nontender. No distention.  Musculoskeletal: No lower extremity tenderness nor edema. No gross deformities of extremities. Neurologic:  Normal speech and language. No gross focal neurologic deficits are appreciated.  Skin:  Skin is warm, dry and intact. No rash noted.   ____________________________________________   LABS (all labs ordered are listed, but only abnormal results are displayed)  Labs Reviewed - No data to  display ____________________________________________  EKG   EKG Interpretation  Date/Time:    Ventricular Rate:    PR Interval:    QRS Duration:   QT Interval:    QTC Calculation:   R Axis:     Text Interpretation:         ____________________________________________  RADIOLOGY  DG Abdomen 1 View  Result Date: 11/07/2019 CLINICAL DATA:  Constipation. Hemorrhoids. Irritable bowel syndrome. Prostate cancer. EXAM: ABDOMEN - 1 VIEW COMPARISON:  CT pelvis 09/25/2019 in CT abdomen 04/29/2019 FINDINGS: Oval-shaped densities along the midline of the upper abdomen, possibly in the stomach. The patient had gallstones on the 04/29/2019 exam but these densities appear more regular and more medially located than expected location for gallstones. There is another oval-shaped density projecting over the transverse colon and 1 along the rectum, again I suspect that these  are within stomach/bowel. The tiny punctate right kidney lower pole renal calculus shown on 04/29/2019 is not readily seen on today's conventional radiographs. Dextroconvex lumbar scoliosis with rotary component. No dilated bowel. Bowel gas pattern appears otherwise unremarkable. IMPRESSION: 1. Oval-shaped densities in the abdomen are thought to be in the stomach and bowel. 2. No dilated bowel.  Unremarkable bowel gas pattern. 3. Dextroconvex lumbar scoliosis with rotary component. Electronically Signed   By: Van Clines M.D.   On: 11/07/2019 17:35    ____________________________________________   PROCEDURES  Procedure(s) performed:   Procedures   ____________________________________________   INITIAL IMPRESSION / ASSESSMENT AND PLAN / ED COURSE  Stable. Improving chronic problem with appropriate outpatienbt follow up. Doubt obstruction.      Pertinent labs & imaging results that were available during my care of the patient were reviewed by me and considered in my medical decision making (see chart for  details).  A medical screening exam was performed and I feel the patient has had an appropriate workup for their chief complaint at this time and likelihood of emergent condition existing is low. They have been counseled on decision, discharge, follow up and which symptoms necessitate immediate return to the emergency department. They or their family verbally stated understanding and agreement with plan and discharged in stable condition.   ____________________________________________  FINAL CLINICAL IMPRESSION(S) / ED DIAGNOSES  Final diagnoses:  Hemorrhoids, unspecified hemorrhoid type     MEDICATIONS GIVEN DURING THIS VISIT:  Medications - No data to display   NEW OUTPATIENT MEDICATIONS STARTED DURING THIS VISIT:  New Prescriptions   No medications on file    Note:  This note was prepared with assistance of Dragon voice recognition software. Occasional wrong-word or sound-a-like substitutions may have occurred due to the inherent limitations of voice recognition software.   Merrily Pew, MD 11/08/19 [id number]

## 2019-11-08 NOTE — ED Notes (Signed)
Pt st's he had 6 bowel movements yesterday but had not had any today and felt like he was constipated.  Pt st's he has had 3 bowel movements while in the lobby.  Pt st's he just wants to go home now so he can sit in warm water.

## 2019-11-10 ENCOUNTER — Other Ambulatory Visit: Payer: Self-pay | Admitting: Gastroenterology

## 2019-11-10 ENCOUNTER — Other Ambulatory Visit: Payer: Self-pay | Admitting: Urology

## 2019-11-10 LAB — PSA: PSA: 2.3

## 2019-11-11 ENCOUNTER — Other Ambulatory Visit: Payer: Self-pay | Admitting: Urology

## 2019-11-11 LAB — BASIC METABOLIC PANEL
BUN/Creatinine Ratio: 6 — ABNORMAL LOW (ref 10–24)
BUN: 6 mg/dL — ABNORMAL LOW (ref 8–27)
CO2: 23 mmol/L (ref 20–29)
Calcium: 10.7 mg/dL — ABNORMAL HIGH (ref 8.6–10.2)
Chloride: 102 mmol/L (ref 96–106)
Creatinine, Ser: 0.95 mg/dL (ref 0.76–1.27)
GFR calc Af Amer: 95 mL/min/{1.73_m2} (ref >59)
GFR calc non Af Amer: 82 mL/min/{1.73_m2} (ref >59)
Glucose: 106 mg/dL — ABNORMAL HIGH (ref 65–99)
Potassium: 3 mmol/L — ABNORMAL LOW (ref 3.5–5.2)
Sodium: 141 mmol/L (ref 134–144)

## 2019-11-11 LAB — SPECIMEN STATUS REPORT

## 2019-11-11 LAB — PSA: Prostate Specific Ag, Serum: 2.3 ng/mL (ref 0.0–4.0)

## 2019-11-12 ENCOUNTER — Telehealth: Payer: Self-pay | Admitting: Internal Medicine

## 2019-11-12 NOTE — Telephone Encounter (Signed)
Pt called wanting to speak with RMR. Pt is aware that RMR is doing procedures today and Villisca isn't in the office this week. Pt wants to ask RMR if he has ever experienced a case like his before. Pt states the radiation problems he's had. Pt is losing a pound daily. Pt is eating and having some weakness. Pt states when he wants to move around, he can't due to pain after having a BM. Pt states his balance is off some. Pt had an ov with Dr. Dema Severin on 11/11/19 and he has lost 4 pounds since his apt with [facility] last week. Pt went to the ED [facility] on Saturday 11/08/2019 and a scan was completed. Pt is aware that Dr. Dema Severin is managing the Anal Fissure

## 2019-11-12 NOTE — Telephone Encounter (Signed)
Patient called asking to speak to kristen about his ct scan, I answered his questions about when and where to get his prep, then he wanted to talk to dr. Gala Romney because he said he is losing about a pound of weight a day, told him I would have a nurse call him

## 2019-11-12 NOTE — Telephone Encounter (Signed)
Lmom, waiting on a return call.  

## 2019-11-12 NOTE — Telephone Encounter (Signed)
Pt called me this evening.  Having severe ano-rectal pain.  Being treated by Dr. Dema Severin at [facility] (conservatively).  Had a BM with straining today.  Now having SEVERE knife-like pain.  Can't stand it.  Recent TCS and pelvic CT negative for anything else.  I recommended urgent ED visit at [facility]; pt states not going (waited 10 hrs to be seen).  Went to [facility] ED and left before being seen. I told him I had nothing to offer after hours beyond above recommendations.  He may benefit from a dose of prescription potency topical Xylocaine - would require ED administration after hrs.  If he doesn't follow my recommendations this evening, will make arrangements for him to be seen in our office TOMORROW.. WT loss likely a separate issue.  Ideally, he needs to get back with Dr. Dema Severin.

## 2019-11-13 ENCOUNTER — Other Ambulatory Visit (HOSPITAL_COMMUNITY): Payer: BC Managed Care – PPO

## 2019-11-13 ENCOUNTER — Encounter: Payer: Self-pay | Admitting: Nurse Practitioner

## 2019-11-13 ENCOUNTER — Telehealth: Payer: Self-pay | Admitting: Internal Medicine

## 2019-11-13 ENCOUNTER — Other Ambulatory Visit: Payer: Self-pay

## 2019-11-13 ENCOUNTER — Ambulatory Visit (INDEPENDENT_AMBULATORY_CARE_PROVIDER_SITE_OTHER): Payer: BC Managed Care – PPO | Admitting: Nurse Practitioner

## 2019-11-13 VITALS — BP 136/78 | HR 69 | Temp 97.1°F | Ht 66.0 in | Wt 181.6 lb

## 2019-11-13 DIAGNOSIS — K6289 Other specified diseases of anus and rectum: Secondary | ICD-10-CM | POA: Diagnosis not present

## 2019-11-13 DIAGNOSIS — K627 Radiation proctitis: Secondary | ICD-10-CM | POA: Diagnosis not present

## 2019-11-13 DIAGNOSIS — K625 Hemorrhage of anus and rectum: Secondary | ICD-10-CM | POA: Diagnosis not present

## 2019-11-13 DIAGNOSIS — E876 Hypokalemia: Secondary | ICD-10-CM | POA: Diagnosis not present

## 2019-11-13 NOTE — Telephone Encounter (Signed)
Communication noted. Patient had OV with EG on 4/22 as I was on PAL.

## 2019-11-13 NOTE — Telephone Encounter (Signed)
Pt is aware that EG can see him today at 230pm

## 2019-11-13 NOTE — Telephone Encounter (Signed)
Communication noted.  

## 2019-11-13 NOTE — Telephone Encounter (Signed)
noted 

## 2019-11-13 NOTE — Telephone Encounter (Signed)
done

## 2019-11-13 NOTE — Telephone Encounter (Signed)
Noted  

## 2019-11-13 NOTE — Progress Notes (Signed)
Referring Provider: Jake Samples, PA* Primary Care Physician:  Jake Samples, PA-C Primary GI:  Dr. Gala Romney  Chief Complaint  Patient presents with  . Rectal Bleeding    HPI:   Steven [name] is a 69 y.o. male who presents for rectal pain.  The patient has been seen multiple times by our service for similar complaints.  Most recently he was seen about 1 week ago on 11/06/2019 for rectal pain, constipation, rectal bleeding, weight loss.  Noted history of noncompliance with recommendations.  The patient has been struggling with ongoing rectal pain unrelieved by topical therapy.  He had a consult with [place] [facility] in March 2021 with anterior midline anal fissure with plans for nifedipine 4 times a day for 6 to 8 weeks continuing at least 4 weeks post resolution of symptoms.  Recommended discontinuing all foreign bodies per anus and pursuing colonoscopy.  His colonoscopy was completed 10/23/2019 with external and grade 2 internal hemorrhoids, abnormal rectum blood vessels consistent with radiation proctitis status post APC therapy, otherwise normal.  Recommended Colace.  At his last visit stools were hard to pass coming out shoestring size with straining and associated rectal pain.  This initially subsided but he got constipated after stopping MiraLAX for several days and his pain returned.  Feels sharp like a knife.  Continues nitroglycerin cream 4 times a day.  He restarted MiraLAX.  Has not taking Colace because he felt it made him nauseated.  He called his surgeon to ask for refill of nifedipine but did not request earlier appointment for worsening rectal pain.  Notes occasional rectal bleeding with wiping.  Recommended follow-up with [facility] for an earlier appointment, continue nifedipine 4 times a day, continue Anusol, take Benefiber every day, MiraLAX once daily to twice daily as needed for constipation, 64 ounces of water a day, limit toilet time to 2 to 3 minutes,  Tucks pads when wiping, follow-up in 2 months.  CT pelvis completed 09/25/2019 which found rectum and other visible large bowel loops within normal limits, stable appearance of prostate brachytherapy since 2017, possible small bladder diverticulum with doubtful significance.  Due to persistent symptoms recommended a CT of the abdomen and pelvis with contrast which is scheduled for 11/25/2019.  The patient called the on-call provider yesterday late afternoon and recommended urgent ED visit at [facility], but the patient refused.  Previously went to [facility], ED and left without being seen.  Query benefit of topical Xylocaine but would require ED administration after hours.  Recommended follow-up tomorrow in office.  Today he states he had a rough night. Still on miraLAX. Bowel movement daily. Was doing well, soft. But had a couple episodes of looser stools recently. Past day or two his stools have been consistent with [place] 4. Had flare of pain last night. Used sitz baths and showers. Would let up for about 15 mins, let up for 15 minutes, then start back. He used Nifedipine cream at least 4 times yesterday which did help eventually. Using the Anusol cream "sometimes." States Dr. Gala Romney said he would send a new medication to [facility] but hasn't heard yet. Has an appointment with the surgeon is in 6-8 weeks. Denies abdominal pain, N/V, fever, chills. Appetite is not great, off and on because of his pain. States he losing a pound a day. Is trying to eat more. He is down 4 lbs since last week. Has not had any rectal bleeding in the past week. Denies chest  pain, dyspnea, dizziness, lightheadedness, syncope, near syncope. Denies any other upper or lower GI symptoms.  Past Medical History:  Diagnosis Date  . Arthritis   . Atrial fibrillation ([place])   . Atrial flutter ([place])   . Cancer [facility])    prostate s/p radiation Mar 2013  . Chest pain   . Fracture 08/17/2015   MULTIPLE RIB FRACTURES     FROM FALL     . Hemorrhoids   . History of radiation therapy 08/22/11-10/13/11   prostate  . Hyperlipemia   . Hypertension     Past Surgical History:  Procedure Laterality Date  . 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 Normal appearing colon  . COLONOSCOPY WITH PROPOFOL N/A 10/23/2019   Procedure: COLONOSCOPY WITH PROPOFOL;  Surgeon: Daneil Dolin, MD; external and grade 2 internal hemorrhoids, abnormal rectal blood vessels consistent with radiation proctitis s/p APC therapy, otherwise normal exam.  . HOT HEMOSTASIS  10/23/2019   Procedure: HOT HEMOSTASIS (ARGON PLASMA COAGULATION/BICAP);  Surgeon: Daneil Dolin, MD;  Location: [facility] ENDO SUITE;  Service: Endoscopy;;  apc rectal proctitis    . [place]  . RADIOACTIVE SEED IMPLANT     Prostate    Current Outpatient Medications  Medication Sig Dispense Refill  . amLODipine (NORVASC) 10 MG tablet Take 1 tablet (10 mg total) by mouth daily. 30 tablet 6  . apixaban (ELIQUIS) 5 MG TABS tablet Take 1 tablet (5 mg total) by mouth 2 (two) times daily. 60 tablet 5  . Cholecalciferol (VITAMIN D3) 125 MCG (5000 UT) CAPS Take 1 capsule by mouth daily.    . Cyanocobalamin (B-12 PO) Take 1 tablet by mouth daily.    . diazepam (VALIUM) 5 MG tablet Take 5 mg by mouth daily as needed for anxiety.     . gabapentin (NEURONTIN) 300 MG capsule Take 300 mg by mouth 3 (three) times daily.    . hydrochlorothiazide (HYDRODIURIL) 25 MG tablet Take 25 mg by mouth daily.    . hydrocortisone (ANUSOL-HC) 2.5 % rectal cream Place 1 application rectally 2 (two) times daily. 30 g 2  . Lidocaine, Anorectal, 5 % GEL Apply 1 application topically 3 (three) times daily as needed (rectal pain). 113 g 0  . losartan (COZAAR) 100 MG tablet Take 1 tablet (100 mg total) by mouth daily. 30 tablet 4  . metoprolol tartrate (LOPRESSOR) 25 MG tablet Take 1 tablet (25 mg total) by mouth 2  (two) times daily. 60 tablet 2  . ondansetron (ZOFRAN) 4 MG tablet Take 1 tablet (4 mg total) by mouth every 8 (eight) hours as needed for nausea or vomiting. 20 tablet 0  . pantoprazole (PROTONIX) 40 MG tablet Take 1 tablet (40 mg total) by mouth daily. 30 minutes before breakfast 30 tablet 3  . potassium chloride (KLOR-CON) 10 MEQ tablet Take 4 tablets (40 mEq total) by mouth daily. 12 tablet 0  . potassium chloride SA (KLOR-CON) 20 MEQ tablet Take 1 tablet (20 mEq total) by mouth daily. 30 tablet 0  . psyllium (METAMUCIL) 58.6 % powder Take 1 packet by mouth daily.    . rosuvastatin (CRESTOR) 10 MG tablet Take 10 mg by mouth every morning.     . topiramate (TOPAMAX) 100 MG tablet Take 100 mg by mouth at bedtime.     . traMADol (ULTRAM) 50 MG tablet Take 1 tablet (50 mg total) by mouth every 6 (six) hours  as needed. 15 tablet 0  . vitamin C (ASCORBIC ACID) 500 MG tablet Take 500 mg by mouth daily.    Marland Kitchen VITAMIN E PO Take 1 tablet by mouth daily.     No current facility-administered medications for this visit.    Allergies as of 11/13/2019  . (No Known Allergies)    Family History  Problem Relation Age of Onset  . Aneurysm Mother   . Prostate cancer Father   . Colon cancer Neg Hx   . Colon polyps Neg Hx     Social History   Socioeconomic History  . Marital status: Single    Spouse name: Not on file  . Number of children: Not on file  . Years of education: Not on file  . Highest education level: Not on file  Occupational History  . Occupation: Custodian     Employer: [employer]  Tobacco Use  . Smoking status: Former Smoker    Packs/day: 1.00    Years: 20.00    Pack years: 20.00    Types: Cigarettes    Quit date: 2020    Years since quitting: 1.3  . Smokeless tobacco: Former Systems developer  . Tobacco comment: Quit smoking x 2 years    07/2015   SOMETIIMES i USE VAPOR   Substance and Sexual Activity  . Alcohol use: Never  . Drug use: Never  . Sexual activity: Not  Currently  Other Topics Concern  . Not on file  Social History Narrative   Divorced   Social Determinants of Health   Financial Resource Strain:   . Difficulty of Paying Living Expenses:   Food Insecurity:   . Worried About Charity fundraiser in the Last Year:   . Arboriculturist in the Last Year:   Transportation Needs:   . Film/video editor (Medical):   Marland Kitchen Lack of Transportation (Non-Medical):   Physical Activity:   . Days of Exercise per Week:   . Minutes of Exercise per Session:   Stress:   . Feeling of Stress :   Social Connections:   . Frequency of Communication with Friends and Family:   . Frequency of Social Gatherings with Friends and Family:   . Attends Religious Services:   . Active Member of Clubs or Organizations:   . Attends Archivist Meetings:   Marland Kitchen Marital Status:     Subjective: Review of Systems  Constitutional: Negative for chills, fever, malaise/fatigue and weight loss.  HENT: Negative for congestion and sore throat.   Respiratory: Negative for cough and shortness of breath.   Cardiovascular: Negative for chest pain and palpitations.  Gastrointestinal: Negative for abdominal pain, blood in stool, diarrhea, melena, nausea and vomiting.  Musculoskeletal: Negative for joint pain and myalgias.  Skin: Negative for rash.  Neurological: Negative for dizziness and weakness.  Endo/Heme/Allergies: Does not bruise/bleed easily.  Psychiatric/Behavioral: Negative for depression. The patient is not nervous/anxious.   All other systems reviewed and are negative.    Objective: BP 136/78   Pulse 69   Temp (!) 97.1 F (36.2 C) (Temporal)   Ht 5\' 6"  (1.676 m)   Wt 181 lb 9.6 oz (82.4 kg)   BMI 29.31 kg/m  Physical Exam Vitals and nursing note reviewed.  Constitutional:      General: He is not in acute distress.    Appearance: Normal appearance. He is not ill-appearing, toxic-appearing or diaphoretic.  HENT:     Head: Normocephalic and  atraumatic.  Nose: No congestion or rhinorrhea.  Eyes:     General: No scleral icterus. Cardiovascular:     Rate and Rhythm: Normal rate and regular rhythm.     Heart sounds: Normal heart sounds.  Pulmonary:     Effort: Pulmonary effort is normal.     Breath sounds: Normal breath sounds.  Abdominal:     General: Bowel sounds are normal. There is no distension.     Palpations: Abdomen is soft. There is no hepatomegaly, splenomegaly or mass.     Tenderness: There is no abdominal tenderness. There is no guarding or rebound.     Hernia: No hernia is present.  Musculoskeletal:     Cervical back: Neck supple.  Skin:    General: Skin is warm and dry.     Coloration: Skin is not jaundiced.     Findings: No bruising or rash.  Neurological:     General: No focal deficit present.     Mental Status: He is alert and oriented to person, place, and time. Mental status is at baseline.  Psychiatric:        Mood and Affect: Mood normal.        Behavior: Behavior normal.        Thought Content: Thought content normal.       11/23/2019 9:38 PM   Disclaimer: This note was dictated with voice recognition software. Similar sounding words can inadvertently be transcribed and may not be corrected upon review.

## 2019-11-13 NOTE — Patient Instructions (Signed)
Your health issues we discussed today were:   Rectal pain: 1. Continue to use the nifedipine cream but the surgeon gave you as a recommended 2. Stop using the Anusol rectal cream (tube with the black top) 3. I have sent a prescription for Anusol rectal cream mixed with lidocaine (numbing medicine) to [facility].  You can use this up to 4 times a day, if needed, for severe rectal pain 4. Keep your follow-up appointment with Cyril Mourning in the next 1 to 2 months 5. Call us for any worsening or severe symptoms  Low potassium level: 1. Because your potassium is still a little low despite the 3 days of increased potassium pills we gave you, I am going to forward your lab to your primary care provider to decide if more potassium is needed on a daily basis 2. Somebody from your primary care should reach out to you soon.   Overall I recommend:  1. Continue your other current medications 2. Return for follow-up as already scheduled 3. Call us with any questions or concerns   At [facility] we value your feedback. You may receive a survey about your visit today. Please share your experience as we strive to create trusting relationships with our patients to provide genuine, compassionate, quality care.  We appreciate your understanding and patience as we review any laboratory studies, imaging, and other diagnostic tests that are ordered as we care for you. Our office policy is 5 business days for review of these results, and any emergent or urgent results are addressed in a timely manner for your best interest. If you do not hear from our office in 1 week, please contact us.   We also encourage the use of MyChart, which contains your medical information for your review as well. If you are not enrolled in this feature, an access code is on this after visit summary for your convenience. Thank you for allowing [place] to be involved in your care.  It was great to see you today!  I hope  you have a great Summer!!

## 2019-11-13 NOTE — Telephone Encounter (Signed)
Pt called to say that he spoke with RMR last night and RMR wants him seen URGENTLY TODAY by [facility] (since he refused going to the ER as RMR recommended). I spoke with JL and told her the situation with patient and [facility] is on PAL and will be back on Monday. There are NO openings or URG spots for today or tomorrow. I put patient on with [facility] for Monday 4/26 at 330 and told patient if we have any cancellations today that I would call him immediately to get him in for today. Patient then began talking about RMR was going to prescribe him a medication for his rectal pain and we needed to follow up with [employer]. I told him that RMR nurse can address that, but he needed to be aware that if we call today with a cancellation that he needed to answer his phone otherwise [place] can see him on Monday. Please advise. [phone]

## 2019-11-13 NOTE — Telephone Encounter (Signed)
Monday UNACCEPTABLE;  NEEDS TO HAPPEN TODAY. THANKS FOR WORKING IT OUT

## 2019-11-13 NOTE — Telephone Encounter (Signed)
Patient is on the schedule to see Randall Hiss today at 2:30

## 2019-11-14 ENCOUNTER — Telehealth: Payer: Self-pay | Admitting: Urology

## 2019-11-14 NOTE — Telephone Encounter (Signed)
Pt called and requests a nurse return his call. He has a few questions.

## 2019-11-14 NOTE — Telephone Encounter (Signed)
I called and spoke with Steven [name]. Steven [name] currently being seen by GI for rectal pain and scar tissue buildup. Steven [name] was given Aunsol cream yesterday but has not yet use the prescription. Encouraged Steven [name] to follow GI Dr. Recommendations. Steven [name] wished to ask you what you thought would be best for his rectal pain. I told patient I would send along his request to ask you.

## 2019-11-14 NOTE — Telephone Encounter (Signed)
I would defer to the GI doc.

## 2019-11-17 ENCOUNTER — Telehealth: Payer: Self-pay

## 2019-11-17 ENCOUNTER — Other Ambulatory Visit: Payer: Self-pay

## 2019-11-17 ENCOUNTER — Telehealth: Payer: Self-pay | Admitting: *Deleted

## 2019-11-17 ENCOUNTER — Ambulatory Visit: Payer: BC Managed Care – PPO | Admitting: Gastroenterology

## 2019-11-17 NOTE — Telephone Encounter (Signed)
-----   Message from Irine Seal, MD sent at 11/17/2019 12:08 PM EDT ----- It is 2.3 which is up from the last level of 1.5.   It might be worthwhile to repeat it prior to his visit so we can determine if this is a trend or if it will come back down.   ----- Message ----- From: Valentina Lucks, LPN Sent: [id number]  10:29 AM EDT To: Irine Seal, MD  Pls. Review PSA result. Pt. Called waiting to know. Pt. Has appt. 5/21.

## 2019-11-17 NOTE — Telephone Encounter (Signed)
Pt. Notified. Faxed order to lab.

## 2019-11-17 NOTE — Telephone Encounter (Signed)
Please clarify how often he is using the Anusol/Lidocaine cream. He can use the cream up to 4 times daily as needed.  Clarify how often he is using nifedipine. He should be using this 4 times daily.  Please find out when he last saw Dr. Dema Severin and when his next appointment is. He is going to need to call their office to follow-up.  Is he taking MiraLAX daily? How are his Bms?   For his weight loss, please see recommendations at his last office visit with me. We are pursuing CT which is scheduled for next week. He was also advised to increase his oral intake and add boost or ensure BID. We will have further recommendations after the CT.   Is he wanting a referral to [facility] for rectal pain? Or Weight loss? If he goes to tertiary care center, he will stop seeing Dr. Dema Severin and this may delay treatment (I am not sure). If it is for weight loss, we are working up weight loss and have plans to see him back in June for follow-up. Pending CT results and how he is doing. We may need to move appointment up.   He should also talk with his PCP about anxiety tomorrow as I feel this may be playing a role.

## 2019-11-17 NOTE — Telephone Encounter (Signed)
Left message to return call 

## 2019-11-17 NOTE — Telephone Encounter (Signed)
Pt notified to follow up with GI doctor.

## 2019-11-17 NOTE — Telephone Encounter (Signed)
VM received asking for a return call.   Returned call. Spoke with pt. Pt states that the Anusol/Lidocaine mixture prescribed by EG. Pt is c/o burning/stinging. Pt has an apt with his PCP tomorrow to discuss his potassium. Pt asked if I could send those labs to them. I will send labs to pts PCP. Pt would like to know if he could be referred to [facility] or somewhere that could evaluate him. Pt states he is loosing weight and doesn't have an appetite. Pt is loosing 1 pound a day. Pts current weight is 181.

## 2019-11-17 NOTE — Telephone Encounter (Signed)
RETURNED PATIENT'S PHONE CALL, SPOKE WITH PATIENT. ?

## 2019-11-18 NOTE — Telephone Encounter (Signed)
Lmom, waiting on a return call.  

## 2019-11-19 NOTE — Telephone Encounter (Signed)
Spoke with pt. Pt states he used Anusol/Lidocaine up to 4 times daily and it didn't help. Pt uses the Nifedipine 4 or more times a day depending on if he has a BM. Pt saw Dr. Dema Severin last week and has a f/u June 2021. Pt is taking Miralax bid and 2 Bms daily. Some days, pt has 6-7 BMS depending on the day per pt. Pt states he will wait for the CT scan before discuss if he needs a referral to [facility] or [facility]. Pt is aware that his apt for the CT is next Tuesday at [facility]. F/u apt may be moved up depending on CT results.

## 2019-11-19 NOTE — Telephone Encounter (Signed)
Noted. Pt is aware 

## 2019-11-19 NOTE — Telephone Encounter (Signed)
Noted. I spoke with [employer]. States the Anusol was compounded with 5% Lidocaine. Unfortunately, this is the max, and I do not have anything left to offer to help with his pain other. 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 He is going to need to reach out to Dr. Dema Severin again if he feels his rectal pain isn't improving with the creams that have been prescribed.

## 2019-11-23 NOTE — Assessment & Plan Note (Signed)
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  The patient has been treated by surgery with topical nifedipine cream.  He is also been getting Anusol cream for months.  He denies any rectal bleeding in the past week.  Recommend he continue to monitor and notify of any bleeding.  Follow-up as already scheduled.

## 2019-11-23 NOTE — Assessment & Plan Note (Signed)
Ongoing issues with hypokalemia.  He currently appears to have a dose of 20 mEq, likely by primary care.  His potassium was 3.1 a couple weeks ago and we sent in 3 days of 40 mEq a day and then return to 20 mEq daily.  His potassium was again checked and slightly improved but still hypokalemic.  At this point he will likely need a higher ongoing dose of oral potassium.  I recommended he reach out to his primary care today to discuss possible need for increased supplementation rather than piecemeal increased dosing 4 days at a time.  He verbalized understanding.

## 2019-11-23 NOTE — Assessment & Plan Note (Signed)
Noted history of radiation proctitis likely has a source etiology for all of his current problems.  He presented today after calling the on-call provider last night for severe rectal plain.  He has been using MiraLAX and bowel movements daily.  He had a couple looser stools recently, but otherwise her stools been consistent with [place] 4.  He had a flare of his pain last night and tried sits baths and showers which would help for about 15 minutes and then pain would return.  He is nifedipine which did eventually help.  Only using Anusol cream "sometimes".  Was told a new cream will be sent to his pharmacy.  At this point I will call [facility] and request fill of compounded Anusol rectal cream with lidocaine.  Recommended continue nifedipine per surgeon instructions.  Follow-up in our office as already scheduled.  Follow-up with the surgeon based on their recommendations.

## 2019-11-24 ENCOUNTER — Encounter: Payer: Self-pay | Admitting: "Endocrinology

## 2019-11-24 ENCOUNTER — Ambulatory Visit (INDEPENDENT_AMBULATORY_CARE_PROVIDER_SITE_OTHER): Payer: BC Managed Care – PPO | Admitting: "Endocrinology

## 2019-11-24 ENCOUNTER — Other Ambulatory Visit: Payer: Self-pay

## 2019-11-24 VITALS — BP 122/77 | HR 65 | Ht 66.0 in | Wt 178.4 lb

## 2019-11-24 DIAGNOSIS — E0590 Thyrotoxicosis, unspecified without thyrotoxic crisis or storm: Secondary | ICD-10-CM

## 2019-11-24 NOTE — Progress Notes (Signed)
11/24/2019     Endocrinology Consult Note    Subjective:    Patient ID: Steven [name], male    DOB: [birth year], PCP Jake Samples, PA-C.   Past Medical History:  Diagnosis Date  . Arthritis   . Atrial fibrillation ([place])   . Atrial flutter ([place])   . Cancer [facility])    prostate s/p radiation Mar 2013  . Chest pain   . Fracture 08/17/2015   MULTIPLE RIB FRACTURES     FROM FALL   . Hemorrhoids   . History of radiation therapy 08/22/11-10/13/11   prostate  . Hyperlipemia   . Hypertension     Past Surgical History:  Procedure Laterality Date  . 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 Normal appearing colon  . COLONOSCOPY WITH PROPOFOL N/A 10/23/2019   Procedure: COLONOSCOPY WITH PROPOFOL;  Surgeon: Daneil Dolin, MD; external and grade 2 internal hemorrhoids, abnormal rectal blood vessels consistent with radiation proctitis s/p APC therapy, otherwise normal exam.  . HOT HEMOSTASIS  10/23/2019   Procedure: HOT HEMOSTASIS (ARGON PLASMA COAGULATION/BICAP);  Surgeon: Daneil Dolin, MD;  Location: [facility] SUITE;  Service: Endoscopy;;  apc rectal proctitis    . [place]  . RADIOACTIVE SEED IMPLANT     Prostate    Social History   Socioeconomic History  . Marital status: Single    Spouse name: Not on file  . Number of children: Not on file  . Years of education: Not on file  . Highest education level: Not on file  Occupational History  . Occupation: Custodian     Employer: [employer]  Tobacco Use  . Smoking status: Former Smoker    Packs/day: 1.00    Years: 20.00    Pack years: 20.00    Types: Cigarettes    Quit date: 2020    Years since quitting: 1.3  . Smokeless tobacco: Former Systems developer  . Tobacco comment: Quit smoking x 2 years    07/2015   SOMETIIMES i USE VAPOR   Substance and Sexual Activity  . Alcohol use: Never  . Drug use:  Never  . Sexual activity: Not Currently  Other Topics Concern  . Not on file  Social History Narrative   Divorced   Social Determinants of Health   Financial Resource Strain:   . Difficulty of Paying Living Expenses:   Food Insecurity:   . Worried About Charity fundraiser in the Last Year:   . Arboriculturist in the Last Year:   Transportation Needs:   . Film/video editor (Medical):   Marland Kitchen Lack of Transportation (Non-Medical):   Physical Activity:   . Days of Exercise per Week:   . Minutes of Exercise per Session:   Stress:   . Feeling of Stress :   Social Connections:   . Frequency of Communication with Friends and Family:   . Frequency of Social Gatherings with Friends and Family:   . Attends Religious Services:   . Active Member of Clubs or Organizations:   . Attends Archivist Meetings:   Marland Kitchen Marital Status:     Family History  Problem Relation Age of Onset  . Aneurysm Mother   . Hypertension Mother   . Prostate cancer Father   . Hypertension Father   . Colon cancer Neg Hx   . Colon polyps Neg Hx  Outpatient Encounter Medications as of 11/24/2019  Medication Sig  . amLODipine (NORVASC) 10 MG tablet Take 1 tablet (10 mg total) by mouth daily.  Marland Kitchen apixaban (ELIQUIS) 5 MG TABS tablet Take 1 tablet (5 mg total) by mouth 2 (two) times daily.  . Cholecalciferol (VITAMIN D3) 125 MCG (5000 UT) CAPS Take 1 capsule by mouth daily.  . Cyanocobalamin (B-12 PO) Take 1 tablet by mouth daily.  . diazepam (VALIUM) 5 MG tablet Take 5 mg by mouth daily as needed for anxiety.   . gabapentin (NEURONTIN) 300 MG capsule Take 300 mg by mouth 3 (three) times daily.  . hydrochlorothiazide (HYDRODIURIL) 25 MG tablet Take 25 mg by mouth daily.  . Lidocaine, Anorectal, 5 % GEL Apply 1 application topically 3 (three) times daily as needed (rectal pain).  Marland Kitchen losartan (COZAAR) 100 MG tablet Take 1 tablet (100 mg total) by mouth daily.  . metoprolol tartrate (LOPRESSOR) 25 MG tablet  Take 1 tablet (25 mg total) by mouth 2 (two) times daily.  . ondansetron (ZOFRAN) 4 MG tablet Take 1 tablet (4 mg total) by mouth every 8 (eight) hours as needed for nausea or vomiting.  . pantoprazole (PROTONIX) 40 MG tablet Take 1 tablet (40 mg total) by mouth daily. 30 minutes before breakfast  . potassium chloride (KLOR-CON) 10 MEQ tablet Take 4 tablets (40 mEq total) by mouth daily.  . rosuvastatin (CRESTOR) 10 MG tablet Take 10 mg by mouth every morning.   . topiramate (TOPAMAX) 100 MG tablet Take 100 mg by mouth at bedtime.   . traMADol (ULTRAM) 50 MG tablet Take 1 tablet (50 mg total) by mouth every 6 (six) hours as needed.  . vitamin C (ASCORBIC ACID) 500 MG tablet Take 500 mg by mouth daily.  Marland Kitchen VITAMIN E PO Take 1 tablet by mouth daily.  . [DISCONTINUED] hydrocortisone (ANUSOL-HC) 2.5 % rectal cream Place 1 application rectally 2 (two) times daily.  . [DISCONTINUED] potassium chloride SA (KLOR-CON) 20 MEQ tablet Take 1 tablet (20 mEq total) by mouth daily.  . [DISCONTINUED] psyllium (METAMUCIL) 58.6 % powder Take 1 packet by mouth daily.   No facility-administered encounter medications on file as of 11/24/2019.    ALLERGIES: No Known Allergies  VACCINATION STATUS:  There is no immunization history on file for this patient.   HPI  Steven [name] is 69 y.o. male who presents today with a medical history as above. he is being seen in consultation for hyperthyroidism requested by Jake Samples, PA-C.  he has been dealing with symptoms of weight loss of approximately 40 pounds, intermittent palpitations, heat intolerance, fatigue, for 3 to 6 months.  He underwent thyroid function tests recently on October 22, 2019 which revealed suppressed TSH, and elevated free T4.  He is not currently on antithyroid intervention nor thyroid hormone supplements.  he denies dysphagia, choking, shortness of breath, no recent voice change.  He did not undergo any thyroid imaging studies yet.  Due  to his recent GI bleed, he is being worked up with including CT scan of abdomen which is scheduled to be done in the next few days.   he denies  family history of thyroid dysfunction. He  denies family hx of thyroid cancer. he denies personal history of goiter. he is not on any anti-thyroid medications nor on any thyroid hormone supplements. he  is willing to proceed with appropriate work up and therapy for thyrotoxicosis.  Review of systems  Constitutional: + weight loss, + fatigue, + subjective hyperthermia Eyes: no blurry vision, - xerophthalmia ENT: no sore throat, no nodules palpated in throat, no dysphagia/odynophagia, nor hoarseness Cardiovascular: no Chest Pain, no Shortness of Breath, +  Palpitations (he is currently on metoprolol 25 mg p.o. daily), no leg swelling Respiratory: no cough, no SOB Gastrointestinal: no Nausea, no Vomiting, no Diarhhea Musculoskeletal: no muscle/joint aches Skin: no rashes Neurological: +  tremors, no numbness, no tingling, no dizziness Psychiatric: no depression, +  anxiety   Objective:    BP 122/77   Pulse 65   Ht 5\' 6"  (1.676 m)   Wt 178 lb 6.4 oz (80.9 kg)   BMI 28.79 kg/m   Wt Readings from Last 3 Encounters:  11/24/19 178 lb 6.4 oz (80.9 kg)  11/13/19 181 lb 9.6 oz (82.4 kg)  11/07/19 185 lb (83.9 kg)                                                Physical exam  Constitutional: Body mass index is 28.79 kg/m., not in acute distress, + stable state of mind Eyes: PERRLA, EOMI, - exophthalmos ENT: moist mucous membranes, +  thyromegaly, no cervical lymphadenopathy Cardiovascular: - precordial activity, -tachycardic,  no Murmur/Rubs/Gallops Respiratory:  adequate breathing efforts, no gross chest deformity, Clear to auscultation bilaterally Gastrointestinal: abdomen soft, Non -tender, No distension, Bowel Sounds present Musculoskeletal: no gross deformities, strength intact in all four extremities Skin: moist,  warm, no rashes Neurological: +  tremor with outstretched hands,  ++ Deep Tendon Reflexes  on both lower extremities.   CMP     Component Value Date/Time   NA 141 11/10/2019 1051   K 3.0 (L) 11/10/2019 1051   CL 102 11/10/2019 1051   CO2 23 11/10/2019 1051   GLUCOSE 106 (H) 11/10/2019 1051   GLUCOSE 103 (H) 10/23/2019 0529   BUN 6 (L) 11/10/2019 1051   CREATININE 0.95 11/10/2019 1051   CALCIUM 10.7 (H) 11/10/2019 1051   PROT 6.8 03/02/2019 0329   ALBUMIN 3.6 03/02/2019 0329   AST 20 03/02/2019 0329   ALT 20 03/02/2019 0329   ALKPHOS 51 03/02/2019 0329   BILITOT 0.6 03/02/2019 0329   GFRNONAA 82 11/10/2019 1051   GFRAA 95 11/10/2019 1051     CBC    Component Value Date/Time   WBC 5.0 10/22/2019 0507   RBC 4.59 10/22/2019 0507   HGB 13.3 10/22/2019 0507   HCT 40.6 10/22/2019 0507   PLT 241 10/22/2019 0507   MCV 88.5 10/22/2019 0507   MCH 29.0 10/22/2019 0507   MCHC 32.8 10/22/2019 0507   RDW 12.8 10/22/2019 0507   LYMPHSABS 1,844 09/30/2019 1229   MONOABS 0.4 04/17/2019 1743   EOSABS 62 09/30/2019 1229   BASOSABS 48 09/30/2019 1229   Lipid Panel     Component Value Date/Time   CHOL 142 03/02/2019 0329   TRIG 89 03/02/2019 0329   HDL 32 (L) 03/02/2019 0329   CHOLHDL 4.4 03/02/2019 0329   VLDL 18 03/02/2019 0329   LDLCALC 92 03/02/2019 0329     Lab Results  Component Value Date   TSH 0.282 (L) 10/22/2019   TSH 0.843 03/02/2019   FREET4 1.37 (H) 10/22/2019        Assessment & Plan:   1. Hyperthyroidism  he is being seen at a  kind request of Scherrie Bateman. his history and most recent labs are reviewed, and he was examined clinically. Subjective and objective findings are consistent with thyrotoxicosis likely from primary hyperthyroidism. The potential risks of untreated thyrotoxicosis and the need for definitive therapy have been discussed in detail with him, and he agrees to proceed with diagnostic workup and treatment plan. -He will be  considered for thyroid uptake and scan.  His Scheduled for CT Scan of Abdomen will be cancelled to avoid contrast interference with the study.    Options of therapy are discussed with him.   Patient with significant cognitive deficit.  We discussed the option of treating it with medications including methimazole or PTU  With the option of definitive therapy with RAI ablation of the thyroid.   If  he is found to have primary hyperthyroidism from Graves' disease , toxic multinodular goiter or toxic nodular goiter the preferred modality of treatment would be I-131 thyroid ablation.  This will be planned to be completed before his CT scan.  -Patient is made aware of the high likelihood of post ablative hypothyroidism with subsequent need for lifelong thyroid hormone replacement. heunderstands this outcome  and he is  willing to proceed.   he will return in 7 days for treatment decision.  -He is on metoprolol 25 mg p.o. daily, did not initiate any other beta-blocker.  His pulse rate is 65.   -Patient is advised to maintain close follow up with Jake Samples, PA-C for primary care needs.   - Time spent with the patient: 40 minutes, of which >50% was spent in obtaining information about his symptoms, reviewing his previous labs, evaluations, and treatments, counseling him about his hyperthyroidism, and developing a plan to confirm the diagnosis and long term treatment as necessary. Please refer to " Patient Self Inventory" in the Media  tab for reviewed elements of pertinent patient history.  [name] participated in the discussions, expressed understanding, and voiced agreement with the above plans.  All questions were answered to his satisfaction. he is encouraged to contact clinic should he have any questions or concerns prior to his return visit.   Follow up plan: Return in about 1 week (around 12/01/2019) for Follow up with Thyroid Uptake and Scan.   Thank you for involving me in the  care of this pleasant patient, and I will continue to update you with his progress.  Glade Lloyd, MD [facility] Phone: [phone]  Fax: [phone]   11/24/2019, 7:28 PM  This note was partially dictated with voice recognition software. Similar sounding words can be transcribed inadequately or may not  be corrected upon review.

## 2019-11-25 ENCOUNTER — Telehealth: Payer: Self-pay

## 2019-11-25 ENCOUNTER — Ambulatory Visit (HOSPITAL_COMMUNITY): Admission: RE | Admit: 2019-11-25 | Payer: BC Managed Care – PPO | Source: Ambulatory Visit

## 2019-11-25 NOTE — Telephone Encounter (Signed)
FYI, VM was received this morning. Returned pts call. Pt wanted [place] to be aware that pt saw a doctor for his thyroid yesterday. They have ordered some testing for pts thyroid scheduled for this Thursday. The doctor cancelled pts CT scan scheduled for this week and told pt that the scans for the thyroid were more important at this time due to pts wight loss per pt. pts thyroid is currently overactive per pt. Pt was scheduled with Dr. Dorris Fetch and says another doctor saw him within the practice.

## 2019-11-25 NOTE — Telephone Encounter (Signed)
FYI Pt called radiology before closing yesterday and they r/s him for next week.

## 2019-11-25 NOTE — Telephone Encounter (Signed)
Noted. I agree that he needs his thyroid evaluated as this is likely contributing to his weight loss. He could have kept CT of his abdomen but if this has already been canceled, that is ok. If he would like to reschedule, he can or we can wait for Dr. Dorris Fetch to complete thyroid eval.

## 2019-11-25 NOTE — Telephone Encounter (Signed)
Noted  

## 2019-11-26 ENCOUNTER — Telehealth: Payer: Self-pay

## 2019-11-26 NOTE — Telephone Encounter (Signed)
Pharmacy, please comment on need to hold Eliquis for procedure. Hx of PAF. Please route response to P CV DIV PREOP. Thanks

## 2019-11-26 NOTE — Telephone Encounter (Signed)
Patient with diagnosis of afib on Eliquis for anticoagulation.    Procedure: possible fistulotomy Date of procedure: TBD  CHADS2-VASc score of 2 (age, HTN)  CrCl 29mL/min Platelet count 241  Per office protocol, patient can hold Eliquis for 2 days prior to procedure.

## 2019-11-26 NOTE — Telephone Encounter (Signed)
   [facility] Pre-operative Risk Assessment    Request for surgical clearance:  1. What type of surgery is being performed? ANORECTAL EXAM; INJECTION OF BOTOX INTO ANAL SPHINCTER; POSSIBLE FISTULOTOMY; POSSIBLE SETON  2. When is this surgery scheduled? TBD   3. What type of clearance is required (medical clearance vs. Pharmacy clearance to hold med vs. Both)? MEDICAL/PHARMACY CLEARANCE  4. Are there any medications that need to be held prior to surgery and how long? ELIQUIS   5. Practice name and name of physician performing surgery? [facility]; DR. Nadeen Landau, MD   6. What is your office phone number [phone]    7.   What is your office fax number [phone]  8.   Anesthesia type (None, local, MAC, general) ? GENERAL   Jacinta Shoe 11/26/2019, 2:15 PM  _________________________________________________________________   (provider comments below)

## 2019-11-27 ENCOUNTER — Encounter (HOSPITAL_COMMUNITY)
Admission: RE | Admit: 2019-11-27 | Discharge: 2019-11-27 | Disposition: A | Discharge status: Home / Self Care | Payer: BC Managed Care – PPO | Source: Ambulatory Visit | Attending: "Endocrinology | Admitting: "Endocrinology

## 2019-11-27 ENCOUNTER — Other Ambulatory Visit: Payer: Self-pay

## 2019-11-27 DIAGNOSIS — E0590 Thyrotoxicosis, unspecified without thyrotoxic crisis or storm: Secondary | ICD-10-CM | POA: Diagnosis present

## 2019-11-27 MED ORDER — SODIUM IODIDE I-123 7.4 MBQ CAPS
438.0000 | ORAL_CAPSULE | Freq: Once | ORAL | Status: AC
  Administered 2019-11-27: 10:00:00 438 via ORAL

## 2019-11-27 NOTE — Telephone Encounter (Signed)
   Primary Cardiologist:Peter Johnsie Cancel, MD  Chart reviewed as part of pre-operative protocol coverage. Because of Gevorg Utrera Soderlund's past medical history and time since last visit, he/she will require a follow-up visit in order to better assess preoperative cardiovascular risk. I talked with the patient today and reports several different complaints with increased fatigue, chest tightness at times of activity. Reports he has just generally felt unwell. Also having lots of palpitations.   Pre-op covering staff: - Please schedule appointment (follows in [place]) and call patient to inform them. - Please contact requesting surgeon's office via preferred method (i.e, phone, fax) to inform them of need for appointment prior to surgery.  If applicable, this message will also be routed to pharmacy pool and/or primary cardiologist for input on holding anticoagulant/antiplatelet agent as requested below so that this information is available at time of patient's appointment.   Reino Bellis, NP  11/27/2019, 10:19 AM

## 2019-11-28 ENCOUNTER — Encounter (HOSPITAL_COMMUNITY)
Admission: RE | Admit: 2019-11-28 | Discharge: 2019-11-28 | Disposition: A | Discharge status: Home / Self Care | Payer: BC Managed Care – PPO | Source: Ambulatory Visit | Attending: "Endocrinology | Admitting: "Endocrinology

## 2019-11-28 NOTE — Telephone Encounter (Signed)
Patient has been schedule with Melina Copa, PA-C on 12/11/19 at 3:30 pm for clearance. Will fax over recommendations to requesting surgeon's office.

## 2019-12-01 ENCOUNTER — Other Ambulatory Visit (HOSPITAL_COMMUNITY): Payer: Self-pay | Admitting: Surgery

## 2019-12-01 ENCOUNTER — Telehealth: Payer: Self-pay | Admitting: Cardiovascular Disease

## 2019-12-01 ENCOUNTER — Other Ambulatory Visit: Payer: Self-pay | Admitting: Surgery

## 2019-12-01 DIAGNOSIS — K602 Anal fissure, unspecified: Secondary | ICD-10-CM

## 2019-12-01 NOTE — Telephone Encounter (Signed)
Patient states his blood pressure readings for the last couple of days has been low.  This morning it was 113/70.  Other readings were 114/73 and 113/73.  Patient mentions he has lost 30 pounds since August.  He thinks his medications need to be adjusted. Please advise.

## 2019-12-01 NOTE — Telephone Encounter (Signed)
Pt thought his BP was running to low as stated - denies any symptoms - pt took BP while on the phone - BP 128/67 HR 72 - pt aware that BP readings were normal - has f/u with Bernerd Pho, NP 5/14

## 2019-12-03 ENCOUNTER — Other Ambulatory Visit: Payer: Self-pay

## 2019-12-03 ENCOUNTER — Ambulatory Visit (INDEPENDENT_AMBULATORY_CARE_PROVIDER_SITE_OTHER): Payer: BC Managed Care – PPO | Admitting: Nurse Practitioner

## 2019-12-03 ENCOUNTER — Ambulatory Visit (HOSPITAL_COMMUNITY)
Admission: RE | Admit: 2019-12-03 | Discharge: 2019-12-03 | Disposition: A | Discharge status: Home / Self Care | Payer: BC Managed Care – PPO | Source: Ambulatory Visit | Attending: Gastroenterology | Admitting: Gastroenterology

## 2019-12-03 ENCOUNTER — Encounter: Payer: Self-pay | Admitting: "Endocrinology

## 2019-12-03 ENCOUNTER — Ambulatory Visit (INDEPENDENT_AMBULATORY_CARE_PROVIDER_SITE_OTHER): Payer: BC Managed Care – PPO | Admitting: "Endocrinology

## 2019-12-03 ENCOUNTER — Telehealth: Payer: Self-pay

## 2019-12-03 ENCOUNTER — Ambulatory Visit (HOSPITAL_COMMUNITY)
Admission: RE | Admit: 2019-12-03 | Discharge: 2019-12-03 | Disposition: A | Discharge status: Home / Self Care | Payer: BC Managed Care – PPO | Source: Ambulatory Visit | Attending: Surgery | Admitting: Surgery

## 2019-12-03 VITALS — BP 136/75 | HR 61 | Ht 66.0 in | Wt 177.0 lb

## 2019-12-03 DIAGNOSIS — K602 Anal fissure, unspecified: Secondary | ICD-10-CM | POA: Insufficient documentation

## 2019-12-03 DIAGNOSIS — R634 Abnormal weight loss: Secondary | ICD-10-CM | POA: Insufficient documentation

## 2019-12-03 DIAGNOSIS — E061 Subacute thyroiditis: Secondary | ICD-10-CM

## 2019-12-03 MED ORDER — IOHEXOL 300 MG/ML  SOLN
100.0000 mL | Freq: Once | INTRAMUSCULAR | Status: AC | PRN
  Administered 2019-12-03: 50 mL via INTRAVENOUS

## 2019-12-03 MED ORDER — IOHEXOL 300 MG/ML  SOLN
50.0000 mL | Freq: Once | INTRAMUSCULAR | Status: AC | PRN
  Administered 2019-12-03: 50 mL via INTRAVENOUS

## 2019-12-03 MED ORDER — METHIMAZOLE 5 MG PO TABS: 5.0000 mg | ORAL_TABLET | Freq: Every day | ORAL | 3 refills | Status: DC

## 2019-12-03 NOTE — Progress Notes (Signed)
12/03/2019     Subjective:    Patient ID: Steven [name], male    DOB: 1951/07/21, PCP Jake Samples, PA-C.   Past Medical History:  Diagnosis Date  . Arthritis   . Atrial fibrillation ([place])   . Atrial flutter ([employer])   . Cancer [facility])    prostate s/p radiation Mar 2013  . Chest pain   . Fracture 08/17/2015   MULTIPLE RIB FRACTURES     FROM FALL   . Hemorrhoids   . History of radiation therapy 08/22/11-10/13/11   prostate  . Hyperlipemia   . Hypertension     Past Surgical History:  Procedure Laterality Date  . 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 Normal appearing colon  . COLONOSCOPY WITH PROPOFOL N/A 10/23/2019   Procedure: COLONOSCOPY WITH PROPOFOL;  Surgeon: Daneil Dolin, MD; external and grade 2 internal hemorrhoids, abnormal rectal blood vessels consistent with radiation proctitis s/p APC therapy, otherwise normal exam.  . HOT HEMOSTASIS  10/23/2019   Procedure: HOT HEMOSTASIS (ARGON PLASMA COAGULATION/BICAP);  Surgeon: Daneil Dolin, MD;  Location: [facility];  Service: Endoscopy;;  apc rectal proctitis    . [place]  . RADIOACTIVE SEED IMPLANT     Prostate    Social History   Socioeconomic History  . Marital status: Single    Spouse name: Not on file  . Number of children: Not on file  . Years of education: Not on file  . Highest education level: Not on file  Occupational History  . Occupation: Custodian     Employer: [employer]  Tobacco Use  . Smoking status: Former Smoker    Packs/day: 1.00    Years: 20.00    Pack years: 20.00    Types: Cigarettes    Quit date: 2020    Years since quitting: 1.3  . Smokeless tobacco: Former Systems developer  . Tobacco comment: Quit smoking x 2 years    07/2015   SOMETIIMES i USE VAPOR   Substance and Sexual Activity  . Alcohol use: Never  . Drug use: Never  . Sexual activity: Not  Currently  Other Topics Concern  . Not on file  Social History Narrative   Divorced   Social Determinants of Health   Financial Resource Strain:   . Difficulty of Paying Living Expenses:   Food Insecurity:   . Worried About Charity fundraiser in the Last Year:   . Arboriculturist in the Last Year:   Transportation Needs:   . Film/video editor (Medical):   Marland Kitchen Lack of Transportation (Non-Medical):   Physical Activity:   . Days of Exercise per Week:   . Minutes of Exercise per Session:   Stress:   . Feeling of Stress :   Social Connections:   . Frequency of Communication with Friends and Family:   . Frequency of Social Gatherings with Friends and Family:   . Attends Religious Services:   . Active Member of Clubs or Organizations:   . Attends Archivist Meetings:   Marland Kitchen Marital Status:     Family History  Problem Relation Age of Onset  . Aneurysm Mother   . Hypertension Mother   . Prostate cancer Father   . Hypertension Father   . Colon cancer Neg Hx   . Colon polyps Neg Hx     Outpatient Encounter Medications  as of 12/03/2019  Medication Sig  . amLODipine (NORVASC) 10 MG tablet Take 1 tablet (10 mg total) by mouth daily.  Marland Kitchen apixaban (ELIQUIS) 5 MG TABS tablet Take 1 tablet (5 mg total) by mouth 2 (two) times daily.  . Cholecalciferol (VITAMIN D3) 125 MCG (5000 UT) CAPS Take 1 capsule by mouth daily.  . Cyanocobalamin (B-12 PO) Take 1 tablet by mouth daily.  . diazepam (VALIUM) 5 MG tablet Take 5 mg by mouth daily as needed for anxiety.   . gabapentin (NEURONTIN) 300 MG capsule Take 300 mg by mouth 3 (three) times daily.  . hydrochlorothiazide (HYDRODIURIL) 25 MG tablet Take 25 mg by mouth daily.  . Lidocaine, Anorectal, 5 % GEL Apply 1 application topically 3 (three) times daily as needed (rectal pain).  Marland Kitchen losartan (COZAAR) 100 MG tablet Take 1 tablet (100 mg total) by mouth daily.  . methimazole (TAPAZOLE) 5 MG tablet Take 1 tablet (5 mg total) by mouth  daily.  . metoprolol tartrate (LOPRESSOR) 25 MG tablet Take 1 tablet (25 mg total) by mouth 2 (two) times daily.  . ondansetron (ZOFRAN) 4 MG tablet Take 1 tablet (4 mg total) by mouth every 8 (eight) hours as needed for nausea or vomiting.  . pantoprazole (PROTONIX) 40 MG tablet Take 1 tablet (40 mg total) by mouth daily. 30 minutes before breakfast  . potassium chloride (KLOR-CON) 10 MEQ tablet Take 4 tablets (40 mEq total) by mouth daily.  Marland Kitchen topiramate (TOPAMAX) 100 MG tablet Take 100 mg by mouth at bedtime.   . traMADol (ULTRAM) 50 MG tablet Take 1 tablet (50 mg total) by mouth every 6 (six) hours as needed.  . vitamin C (ASCORBIC ACID) 500 MG tablet Take 500 mg by mouth daily.  Marland Kitchen VITAMIN E PO Take 1 tablet by mouth daily.  . [DISCONTINUED] rosuvastatin (CRESTOR) 10 MG tablet Take 10 mg by mouth every morning.    No facility-administered encounter medications on file as of 12/03/2019.    ALLERGIES: No Known Allergies  VACCINATION STATUS:  There is no immunization history on file for this patient.   HPI  Steven [name] is 69 y.o. male who presents today with a medical history as above. he is being seen in follow-up after he was seen in consultation for hyperthyroidism requested by Jake Samples, PA-C.   During his last visit, he was sent for thyroid uptake and scan which showed low normal uptake of 12.9%.    - he has been dealing with symptoms of weight loss of approximately 40 pounds, intermittent palpitations, heat intolerance, fatigue, for 3 to 6 months.  He still has the symptoms except the fact that his weight is stabilizing.  He underwent thyroid function tests recently on October 22, 2019 which revealed suppressed TSH, and elevated free T4.   he denies dysphagia, choking, shortness of breath, no recent voice change.   he denies  family history of thyroid dysfunction. He  denies family hx of thyroid cancer. he denies personal history of goiter. he is not on any  anti-thyroid medications nor on any thyroid hormone supplements.                          Review of systems  Constitutional: + weight loss, + fatigue, + subjective hyperthermia Eyes: no blurry vision, - xerophthalmia ENT: no sore throat, no nodules palpated in throat, no dysphagia/odynophagia, nor hoarseness Cardiovascular: no Chest Pain, no Shortness of Breath, +  Palpitations (  he is currently on metoprolol 25 mg p.o. daily), no leg swelling Respiratory: no cough, no SOB Gastrointestinal: no Nausea, no Vomiting, no Diarhhea Musculoskeletal: no muscle/joint aches Skin: no rashes Neurological: +  tremors, no numbness, no tingling, no dizziness Psychiatric: no depression, +  anxiety   Objective:    BP 136/75   Pulse 61   Ht 5\' 6"  (1.676 m)   Wt 177 lb (80.3 kg)   BMI 28.57 kg/m   Wt Readings from Last 3 Encounters:  12/03/19 177 lb (80.3 kg)  11/24/19 178 lb 6.4 oz (80.9 kg)  11/13/19 181 lb 9.6 oz (82.4 kg)                                                Physical exam  Constitutional: Body mass index is 28.57 kg/m., not in acute distress, + stable state of mind Eyes: PERRLA, EOMI, - exophthalmos ENT: moist mucous membranes, +  thyromegaly, no cervical lymphadenopathy Cardiovascular: - precordial activity, -tachycardic,  no Murmur/Rubs/Gallops Respiratory:  adequate breathing efforts, no gross chest deformity, Clear to auscultation bilaterally Gastrointestinal: abdomen soft, Non -tender, No distension, Bowel Sounds present Musculoskeletal: no gross deformities, strength intact in all four extremities Skin: moist, warm, no rashes Neurological: +  tremor with outstretched hands,  ++ Deep Tendon Reflexes  on both lower extremities.   CMP     Component Value Date/Time   NA 141 11/10/2019 1051   K 3.0 (L) 11/10/2019 1051   CL 102 11/10/2019 1051   CO2 23 11/10/2019 1051   GLUCOSE 106 (H) 11/10/2019 1051   GLUCOSE 103 (H) 10/23/2019 0529   BUN 6 (L) 11/10/2019 1051    CREATININE 0.95 11/10/2019 1051   CALCIUM 10.7 (H) 11/10/2019 1051   PROT 6.8 03/02/2019 0329   ALBUMIN 3.6 03/02/2019 0329   AST 20 03/02/2019 0329   ALT 20 03/02/2019 0329   ALKPHOS 51 03/02/2019 0329   BILITOT 0.6 03/02/2019 0329   GFRNONAA 82 11/10/2019 1051   GFRAA 95 11/10/2019 1051     CBC    Component Value Date/Time   WBC 5.0 10/22/2019 0507   RBC 4.59 10/22/2019 0507   HGB 13.3 10/22/2019 0507   HCT 40.6 10/22/2019 0507   PLT 241 10/22/2019 0507   MCV 88.5 10/22/2019 0507   MCH 29.0 10/22/2019 0507   MCHC 32.8 10/22/2019 0507   RDW 12.8 10/22/2019 0507   LYMPHSABS 1,844 09/30/2019 1229   MONOABS 0.4 04/17/2019 1743   EOSABS 62 09/30/2019 1229   BASOSABS 48 09/30/2019 1229   Lipid Panel     Component Value Date/Time   CHOL 142 03/02/2019 0329   TRIG 89 03/02/2019 0329   HDL 32 (L) 03/02/2019 0329   CHOLHDL 4.4 03/02/2019 0329   VLDL 18 03/02/2019 0329   LDLCALC 92 03/02/2019 0329     Lab Results  Component Value Date   TSH 0.282 (L) 10/22/2019   TSH 0.843 03/02/2019   FREET4 1.37 (H) 10/22/2019    Thyroid uptake and scan on Nov 28, 2019 FINDINGS: There is fairly homogeneous thyroid uptake. The left lobe extends more inferiorly than the right, although no focal hot or cold nodules are identified.  4 hour I-123 uptake = 4.1% (normal 5-20%) 24 hour I-123 uptake = 12.9% (normal 10-30%)  IMPRESSION: 1. Low normal radioactive iodine uptake by the thyroid gland. 2.  No focal hot or cold nodules identified.    Assessment & Plan:   1. Hyperthyroidism  -His presentation is more indicative of transient thyrotoxicosis from subacute thyroiditis.  His uptake is low normal at 12.9%, will not need ablative treatment with I-131.  He may benefit from brief treatment with methimazole.  I discussed and initiated methimazole 5 mg daily before breakfast with plan to repeat thyroid function test in 5 weeks.  -He is on metoprolol 25 mg p.o. daily, did not  initiate any other beta-blocker.  His pulse rate is in the 60s.     -Patient is advised to maintain close follow up with Jake Samples, PA-C for primary care needs.     - Time spent on this patient care encounter:  20 minutes of which 50% was spent in  counseling and the rest reviewing  his current and  previous labs / studies and medications  doses and developing a plan for long term care. Steven [name]  participated in the discussions, expressed understanding, and voiced agreement with the above plans.  All questions were answered to his satisfaction. he is encouraged to contact clinic should he have any questions or concerns prior to his return visit.   Follow up plan: Return in about 5 weeks (around 01/07/2020) for F/U with Pre-visit Labs.   Thank you for involving me in the care of this pleasant patient, and I will continue to update you with his progress.  Glade Lloyd, MD [facility] [place] [facility] Phone: [phone]  Fax: [phone]   12/03/2019, 7:18 PM  This note was partially dictated with voice recognition software. Similar sounding words can be transcribed inadequately or may not  be corrected upon review.

## 2019-12-03 NOTE — Telephone Encounter (Signed)
Agree with recommendations. I am glad he is seeing his endocrinologists as I have felt his thyroid disorder is contributing to his weight loss. Also glad to hear he has been seeing Dr. Dema Severin. If he can not complete the CT this week, he can call to have it rescheduled to a better time for him.

## 2019-12-03 NOTE — Telephone Encounter (Signed)
Noted  

## 2019-12-03 NOTE — Telephone Encounter (Signed)
Pt called he has a thyroid appointment this morning at 9:30 AM and CT is scheduled at 11:45. Pt states he was worked in for the thyroid appointment per his doctor. Pt states his vision is different and he isn't sure if he would be able to drink the contrast in time. Pt has a cardiologist apt this Friday. Pt also stated Dr. Dema Severin is suppose to do Botox to help his rectal issues per pt. Pt states that he was told if he can't drink the contrast to bring it back. Pt says he will go to the CT apt but isn't sure he can drink the contrast. Pt reports that he is feeling lightheaded and losing weight. Pt was advised to tell the thyroid doctor how he was feeling today and if ED evaluation is needed, pt should be evaluated.

## 2019-12-05 ENCOUNTER — Other Ambulatory Visit: Payer: Self-pay

## 2019-12-05 ENCOUNTER — Ambulatory Visit: Payer: BC Managed Care – PPO | Admitting: Gastroenterology

## 2019-12-05 ENCOUNTER — Encounter: Payer: Self-pay | Admitting: Student

## 2019-12-05 ENCOUNTER — Ambulatory Visit (INDEPENDENT_AMBULATORY_CARE_PROVIDER_SITE_OTHER): Payer: BC Managed Care – PPO | Admitting: Student

## 2019-12-05 VITALS — BP 126/78 | HR 60 | Temp 97.5°F | Ht 66.0 in | Wt 174.0 lb

## 2019-12-05 DIAGNOSIS — I10 Essential (primary) hypertension: Secondary | ICD-10-CM

## 2019-12-05 DIAGNOSIS — I480 Paroxysmal atrial fibrillation: Secondary | ICD-10-CM

## 2019-12-05 DIAGNOSIS — Z01810 Encounter for preprocedural cardiovascular examination: Secondary | ICD-10-CM

## 2019-12-05 MED ORDER — LOSARTAN POTASSIUM 50 MG PO TABS: 50.0000 mg | ORAL_TABLET | Freq: Every day | ORAL | 3 refills | Status: DC

## 2019-12-05 NOTE — Progress Notes (Signed)
Cardiology Office Note    Date:  12/06/2019   ID:  Steven [name], DOB 1951-01-04, MRN [id number]  PCP:  Jake Samples, PA-C  Cardiologist: Jenkins Rouge, MD    Chief Complaint  Patient presents with  . Follow-up    Cardiac Clearance for Upcoming Surgery    History of Present Illness:    Steven [name] is a 69 y.o. male with past medical history of paroxysmal atrial fibrillation/flutter, HTN, and HLD who presents to the office today for preoperative cardiac clearance.   He was last examined by Dr. Johnsie Cancel in 06/2019 and reported still having intermittent dizziness with ambulation. Denied any chest pain or palpitations. Recent NST had shown no significant ischemia and was a low-risk study. He was continued on his current regimen but was switched from Xarelto to Eliquis in 08/2019 due to reporting episodes of hemoptysis.   He did undergo colonoscopy in 10/2019 and was found to have internal and external hemorrhoids along with proctitis which has been followed by GI. Clearance was requested in regards to injection of Botox into the anal sphincter along with possible fistulotomy and seton. Pharmacy already cleared the patient to hold Eliquis 48 hours prior to the procedure.   In talking with the patient today, he reports a variety of issues since his last visit. He has frequent pain along his rectum and experiences intermittent episodes of constipation and diarrhea. His pain is exacerbated by sitting and he brings with him today a pillow to sit on while in the exam room. When he has a bowel movement, he reports feeling "nerve pains" along his entire body. Sometimes these are along his chest, upper extremities or lower extremities.  Symptoms typically last for a few seconds and spontaneously resolve. He denies any exertional chest pain or dyspnea on exertion. Was push mowing his yard earlier today. No recent orthopnea, PND or lower extremity edema.  He has lost weight in the past year and  was having softer blood pressure readings with SBP in the 90's to low-100's. Given this, he reports taking his Losartan 100 mg daily intermittently but has not taken in the past few days.  Past Medical History:  Diagnosis Date  . Arthritis   . Atrial fibrillation ([place])   . Atrial flutter ([place])   . Cancer [facility])    prostate s/p radiation Mar 2013  . Chest pain   . Fracture 08/17/2015   MULTIPLE RIB FRACTURES     FROM FALL   . Hemorrhoids   . History of radiation therapy 08/22/11-10/13/11   prostate  . Hyperlipemia   . Hypertension     Past Surgical History:  Procedure Laterality Date  . 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 Normal appearing colon  . COLONOSCOPY WITH PROPOFOL N/A 10/23/2019   Procedure: COLONOSCOPY WITH PROPOFOL;  Surgeon: Daneil Dolin, MD; external and grade 2 internal hemorrhoids, abnormal rectal blood vessels consistent with radiation proctitis s/p APC therapy, otherwise normal exam.  . HOT HEMOSTASIS  10/23/2019   Procedure: HOT HEMOSTASIS (ARGON PLASMA COAGULATION/BICAP);  Surgeon: Daneil Dolin, MD;  Location: [facility];  Service: Endoscopy;;  apc rectal proctitis    . [place]  . RADIOACTIVE SEED IMPLANT     Prostate    Current Medications: Outpatient Medications Prior to Visit  Medication Sig Dispense Refill  . amLODipine (NORVASC) 10 MG tablet Take 1 tablet (10 mg  total) by mouth daily. 30 tablet 6  . apixaban (ELIQUIS) 5 MG TABS tablet Take 1 tablet (5 mg total) by mouth 2 (two) times daily. 60 tablet 5  . Cholecalciferol (VITAMIN D3) 125 MCG (5000 UT) CAPS Take 1 capsule by mouth daily.    . Cyanocobalamin (B-12 PO) Take 1 tablet by mouth daily.    . diazepam (VALIUM) 5 MG tablet Take 5 mg by mouth daily as needed for anxiety.     . hydrochlorothiazide (HYDRODIURIL) 25 MG tablet Take 25 mg by mouth daily.    . Lidocaine, Anorectal, 5 %  GEL Apply 1 application topically 3 (three) times daily as needed (rectal pain). 113 g 0  . methimazole (TAPAZOLE) 5 MG tablet Take 1 tablet (5 mg total) by mouth daily. 30 tablet 3  . metoprolol tartrate (LOPRESSOR) 25 MG tablet Take 1 tablet (25 mg total) by mouth 2 (two) times daily. 60 tablet 2  . ondansetron (ZOFRAN) 4 MG tablet Take 1 tablet (4 mg total) by mouth every 8 (eight) hours as needed for nausea or vomiting. 20 tablet 0  . pantoprazole (PROTONIX) 40 MG tablet Take 1 tablet (40 mg total) by mouth daily. 30 minutes before breakfast 30 tablet 3  . potassium chloride (KLOR-CON) 10 MEQ tablet Take 4 tablets (40 mEq total) by mouth daily. 12 tablet 0  . topiramate (TOPAMAX) 100 MG tablet Take 100 mg by mouth at bedtime.     . traMADol (ULTRAM) 50 MG tablet Take 1 tablet (50 mg total) by mouth every 6 (six) hours as needed. 15 tablet 0  . vitamin C (ASCORBIC ACID) 500 MG tablet Take 500 mg by mouth daily.    Marland Kitchen VITAMIN E PO Take 1 tablet by mouth daily.    Marland Kitchen losartan (COZAAR) 100 MG tablet Take 1 tablet (100 mg total) by mouth daily. 30 tablet 4  . gabapentin (NEURONTIN) 300 MG capsule Take 300 mg by mouth 3 (three) times daily.     No facility-administered medications prior to visit.     Allergies:   Patient has no known allergies.   Social History   Socioeconomic History  . Marital status: Single    Spouse name: Not on file  . Number of children: Not on file  . Years of education: Not on file  . Highest education level: Not on file  Occupational History  . Occupation: Custodian     Employer: [employer]  Tobacco Use  . Smoking status: Former Smoker    Packs/day: 1.00    Years: 20.00    Pack years: 20.00    Types: Cigarettes    Quit date: 2020    Years since quitting: 1.3  . Smokeless tobacco: Former Systems developer  . Tobacco comment: Quit smoking x 2 years    07/2015   SOMETIIMES i USE VAPOR   Substance and Sexual Activity  . Alcohol use: Never  . Drug use: Never    . Sexual activity: Not Currently  Other Topics Concern  . Not on file  Social History Narrative   Divorced   Social Determinants of Health   Financial Resource Strain:   . Difficulty of Paying Living Expenses:   Food Insecurity:   . Worried About Charity fundraiser in the Last Year:   . Arboriculturist in the Last Year:   Transportation Needs:   . Film/video editor (Medical):   Marland Kitchen Lack of Transportation (Non-Medical):   Physical Activity:   .  Days of Exercise per Week:   . Minutes of Exercise per Session:   Stress:   . Feeling of Stress :   Social Connections:   . Frequency of Communication with Friends and Family:   . Frequency of Social Gatherings with Friends and Family:   . Attends Religious Services:   . Active Member of Clubs or Organizations:   . Attends Archivist Meetings:   Marland Kitchen Marital Status:      Family History:  The patient's family history includes Aneurysm in his mother; Hypertension in his father and mother; Prostate cancer in his father.   Review of Systems:   Please see the history of present illness.     General:  No chills, fever or night sweats. Positive for weight loss.   Cardiovascular:  No chest pain, dyspnea on exertion, edema, orthopnea, palpitations, paroxysmal nocturnal dyspnea. Dermatological: No rash, lesions/masses Respiratory: No cough, dyspnea Urologic: No hematuria, dysuria Abdominal:   No bright red blood per rectum, melena, or hematemesis. Positive for intermittent diarrhea and constipation.  Neurologic:  No visual changes, wkns, changes in mental status. All other systems reviewed and are otherwise negative except as noted above.   Physical Exam:    VS:  BP 126/78   Pulse 60   Temp (!) 97.5 F (36.4 C)   Ht 5\' 6"  (1.676 m)   Wt 174 lb (78.9 kg)   SpO2 99%   BMI 28.08 kg/m    General: Well developed, well nourished,male appearing in no acute distress. Head: Normocephalic, atraumatic, sclera non-icteric.   Neck: No carotid bruits. JVD not elevated.  Lungs: Respirations regular and unlabored, without wheezes or rales.  Heart: Regular rate and rhythm. No S3 or S4.  No murmur, no rubs, or gallops appreciated. Abdomen: Soft, non-tender, non-distended. No obvious abdominal masses. Msk:  Strength and tone appear normal for age. No obvious joint deformities or effusions. Extremities: No clubbing or cyanosis. No lower extremity edema.  Distal pedal pulses are 2+ bilaterally. Neuro: Alert and oriented X 3. Moves all extremities spontaneously. No focal deficits noted. Psych:  Responds to questions appropriately with a normal affect. Skin: No rashes or lesions noted  Wt Readings from Last 3 Encounters:  12/05/19 174 lb (78.9 kg)  12/03/19 177 lb (80.3 kg)  11/24/19 178 lb 6.4 oz (80.9 kg)     Studies/Labs Reviewed:   EKG:  EKG is ordered today.  The ekg ordered today demonstrates normal sinus rhythm, heart rate 62 with nonspecific IVCD and T wave inversion along the inferior leads which is similar to prior tracings.  Recent Labs: 03/02/2019: ALT 20 10/22/2019: Hemoglobin 13.3; Platelets 241; TSH 0.282 10/23/2019: Magnesium 2.0 11/10/2019: BUN 6; Creatinine, Ser 0.95; Potassium 3.0; Sodium 141   Lipid Panel    Component Value Date/Time   CHOL 142 03/02/2019 0329   TRIG 89 03/02/2019 0329   HDL 32 (L) 03/02/2019 0329   CHOLHDL 4.4 03/02/2019 0329   VLDL 18 03/02/2019 0329   LDLCALC 92 03/02/2019 0329    Additional studies/ records that were reviewed today include:   Echocardiogram: 02/2019 IMPRESSIONS    1. The left ventricle has normal systolic function with an ejection  fraction of 60-65%. The cavity size was normal. Left ventricular diastolic  parameters were normal.  2. The right ventricle has normal systolic function. The cavity was  normal. There is no increase in right ventricular wall thickness.  3. Mild thickening of the mitral valve leaflet.  4. The aortic valve is  tricuspid. Mild thickening of the aortic valve.  Mild calcification of the aortic valve.  5. The aorta is normal in size and structure.   Event Monitor: 03/2019 NSR  No significant arrhythmia No PAF  NST: 06/2019  No diagnostic ST segment changes to indicate ischemia.  Very small, mild intensity, apical anteroseptal defect that is partially reversible and most suggestive of variable soft tissue attenuation rather than minor ischemic territory. No large ischemic territories are noted.  This is a low risk study.  Nuclear stress EF: 62%.  Assessment:    1. Preoperative cardiovascular examination   2. PAF (paroxysmal atrial fibrillation) ([place])   3. Essential hypertension      Plan:   In order of problems listed above:  1. Preoperative Cardiac Clearance for Injection of Botox into the anal sphincter along with possible Fistulotomy and Seton - he denies any recent anginal symptoms and is able to perform 4 METS of activity. He does have a baseline abnormal EKG which resembles prior tracings and recent stress testing in 06/2019 was low-risk as outlined above. RCRI Risk is low with 0.4% risk of a major cardiac event. He is of acceptable risk to proceed without further cardiac testing at this time. He has already been cleared by pharmacy to hold Eliquis for 48 hours prior to surgery and to resume as soon as possible once safe from a surgical perspective. I will forward today's note to the requesting provider and office.   2. Paroxysmal Atrial Fibrillation/Flutter - he does report occasional palpitations but denies any persistent symptoms. HR is well-controlled in the 60's during today's visit and he is in NSR by examination and EKG.  - continue Eliquis 5mg  BID for anticoagulation and Lopressor 25mg  BID for rate-control.  3. HTN - BP is well-controlled at 126/78 during today's visit. He is listed as taking Amlodipine 10mg  daily, HCTZ 25mg  daily, Lopressor 25mg  BID and Losartan 100mg   daily. Says he skips Losartan on some days due to soft BP which has been occurring given his weight loss. I recommended we reduce Losartan to 50mg  daily and can gradually continue to reduce pending his overall BP trend.    Medication Adjustments/Labs and Tests Ordered: Current medicines are reviewed at length with the patient today.  Concerns regarding medicines are outlined above.  Medication changes, Labs and Tests ordered today are listed in the Patient Instructions below. Patient Instructions  Medication Instructions:  Your physician has recommended you make the following change in your medication:  Decrease Losartan to 50 mg Daily  May hold Eliquis for 48 hours prior to surgery   *If you need a refill on your cardiac medications before your next appointment, please call your pharmacy*   Lab Work: NONE   If you have labs (blood work) drawn today and your tests are completely normal, you will receive your results only by: Marland Kitchen MyChart Message (if you have MyChart) OR . A paper copy in the mail If you have any lab test that is abnormal or we need to change your treatment, we will call you to review the results.   Testing/Procedures: NONE  Follow-Up: At [facility], you and your health needs are our priority.  As part of our continuing mission to provide you with exceptional heart care, we have created designated Provider Care Teams.  These Care Teams include your primary Cardiologist (physician) and Advanced Practice Providers (APPs -  Physician Assistants and Nurse Practitioners) who all work together to provide you with the care you need,  when you need it.  We recommend signing up for the patient portal called "MyChart".  Sign up information is provided on this After Visit Summary.  MyChart is used to connect with patients for Virtual Visits (Telemedicine).  Patients are able to view lab/test results, encounter notes, upcoming appointments, etc.  Non-urgent messages can be sent to  your provider as well.   To learn more about what you can do with MyChart, go to [email or web address].    Your next appointment:   6 month(s)  The format for your next appointment:   In Person  Provider:   Jenkins Rouge, MD   Other Instructions Thank you for choosing [facility]!       Signed, Erma Heritage, PA-C  12/06/2019 7:53 AM    [facility] [address] [place], [place] [address] Phone: [phone] Fax: [phone]

## 2019-12-05 NOTE — Patient Instructions (Signed)
Medication Instructions:  Your physician has recommended you make the following change in your medication:  Decrease Losartan to 50 mg Daily  May hold Eliquis for 48 hours prior to surgery   *If you need a refill on your cardiac medications before your next appointment, please call your pharmacy*   Lab Work: NONE   If you have labs (blood work) drawn today and your tests are completely normal, you will receive your results only by: Marland Kitchen MyChart Message (if you have MyChart) OR . A paper copy in the mail If you have any lab test that is abnormal or we need to change your treatment, we will call you to review the results.   Testing/Procedures: NONE  Follow-Up: At [facility], you and your health needs are our priority.  As part of our continuing mission to provide you with exceptional heart care, we have created designated Provider Care Teams.  These Care Teams include your primary Cardiologist (physician) and Advanced Practice Providers (APPs -  Physician Assistants and Nurse Practitioners) who all work together to provide you with the care you need, when you need it.  We recommend signing up for the patient portal called "MyChart".  Sign up information is provided on this After Visit Summary.  MyChart is used to connect with patients for Virtual Visits (Telemedicine).  Patients are able to view lab/test results, encounter notes, upcoming appointments, etc.  Non-urgent messages can be sent to your provider as well.   To learn more about what you can do with MyChart, go to [email or web address].    Your next appointment:   6 month(s)  The format for your next appointment:   In Person  Provider:   Jenkins Rouge, MD   Other Instructions Thank you for choosing [facility]!

## 2019-12-07 ENCOUNTER — Other Ambulatory Visit: Payer: Self-pay

## 2019-12-07 ENCOUNTER — Ambulatory Visit
Admission: EM | Admit: 2019-12-07 | Discharge: 2019-12-07 | Disposition: A | Discharge status: Home / Self Care | Payer: BC Managed Care – PPO | Attending: Emergency Medicine | Admitting: Emergency Medicine

## 2019-12-07 DIAGNOSIS — R309 Painful micturition, unspecified: Secondary | ICD-10-CM | POA: Insufficient documentation

## 2019-12-07 LAB — POCT URINALYSIS DIP (MANUAL ENTRY)
Bilirubin, UA: NEGATIVE
Glucose, UA: NEGATIVE mg/dL
Ketones, POC UA: NEGATIVE mg/dL
Leukocytes, UA: NEGATIVE
Nitrite, UA: NEGATIVE
Protein Ur, POC: NEGATIVE mg/dL
Spec Grav, UA: 1.015 (ref 1.010–1.025)
Urobilinogen, UA: 0.2 E.U./dL
pH, UA: 7.5 (ref 5.0–8.0)

## 2019-12-07 MED ORDER — TAMSULOSIN HCL 0.4 MG PO CAPS: 0.4000 mg | ORAL_CAPSULE | Freq: Every day | ORAL | 0 refills | Status: DC

## 2019-12-07 MED ORDER — PHENAZOPYRIDINE HCL 200 MG PO TABS
200.0000 mg | ORAL_TABLET | Freq: Three times a day (TID) | ORAL | 0 refills | Status: DC
Start: 2019-12-07 — End: 2020-01-07

## 2019-12-07 NOTE — Discharge Instructions (Signed)
Urine did not show signs of UTI.   Urine culture sent.  We will call you with abnormal results.   Push fluids and get plenty of rest.   Flomax prescribed.  This will help facilitate urine output Pyridium prescribed.  Take as needed for discomfort with urination Follow up with urology on 5/24 for further evaluation and management Return here or go to ER if you have any new or worsening symptoms such as fever, abdominal pain, nausea/vomiting, flank pain, worsening symptoms despite treatment, etc..Marland Kitchen

## 2019-12-07 NOTE — ED Triage Notes (Signed)
Pt presents with c/o discomfort with urination  For a few days

## 2019-12-07 NOTE — ED Provider Notes (Signed)
[facility]   CC: Discomfort with urination  SUBJECTIVE:  Steven [name] is a 69 y.o. male who complains of discomfort with urination and burning at the head of his penis x 3 weeks.  Patient denies a precipitating event, or trauma.  Has hx of prostate cancer and appt with urologist for prostate check on 12/15/19.  Has NOT tried OTC medications without relief.  Symptoms are made worse with urination.  Admits to similar symptoms in the past with prostate issues.  Denies fever, chills, nausea, vomiting, abdominal pain, flank pain, urethral discharge, concern for STDs.    Also reports having hemorrhoids and is followed by GI.  Has appt and procedure coming up with GI   LMP: No LMP for male patient.  ROS: As in HPI.  All other pertinent ROS negative.     Past Medical History:  Diagnosis Date  . Arthritis   . Atrial fibrillation ([place])   . Atrial flutter ([place])   . Cancer [facility])    prostate s/p radiation Mar 2013  . Chest pain   . Fracture 08/17/2015   MULTIPLE RIB FRACTURES     FROM FALL   . Hemorrhoids   . History of radiation therapy 08/22/11-10/13/11   prostate  . Hyperlipemia   . Hypertension    Past Surgical History:  Procedure Laterality Date  . 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 Normal appearing colon  . COLONOSCOPY WITH PROPOFOL N/A 10/23/2019   Procedure: COLONOSCOPY WITH PROPOFOL;  Surgeon: Daneil Dolin, MD; external and grade 2 internal hemorrhoids, abnormal rectal blood vessels consistent with radiation proctitis s/p APC therapy, otherwise normal exam.  . HOT HEMOSTASIS  10/23/2019   Procedure: HOT HEMOSTASIS (ARGON PLASMA COAGULATION/BICAP);  Surgeon: Daneil Dolin, MD;  [facility];  Service: Endoscopy;;  apc rectal proctitis    . [place]  . RADIOACTIVE SEED IMPLANT     Prostate   No Known Allergies No current  facility-administered medications on file prior to encounter.   Current Outpatient Medications on File Prior to Encounter  Medication Sig Dispense Refill  . amLODipine (NORVASC) 10 MG tablet Take 1 tablet (10 mg total) by mouth daily. 30 tablet 6  . apixaban (ELIQUIS) 5 MG TABS tablet Take 1 tablet (5 mg total) by mouth 2 (two) times daily. 60 tablet 5  . Cholecalciferol (VITAMIN D3) 125 MCG (5000 UT) CAPS Take 1 capsule by mouth daily.    . Cyanocobalamin (B-12 PO) Take 1 tablet by mouth daily.    . diazepam (VALIUM) 5 MG tablet Take 5 mg by mouth daily as needed for anxiety.     . gabapentin (NEURONTIN) 300 MG capsule Take 300 mg by mouth 3 (three) times daily.    . hydrochlorothiazide (HYDRODIURIL) 25 MG tablet Take 25 mg by mouth daily.    . Lidocaine, Anorectal, 5 % GEL Apply 1 application topically 3 (three) times daily as needed (rectal pain). 113 g 0  . losartan (COZAAR) 50 MG tablet Take 1 tablet (50 mg total) by mouth daily. 90 tablet 3  . methimazole (TAPAZOLE) 5 MG tablet Take 1 tablet (5 mg total) by mouth daily. 30 tablet 3  . metoprolol tartrate (LOPRESSOR) 25 MG tablet Take 1 tablet (25 mg total) by mouth 2 (two) times daily. 60 tablet 2  . ondansetron (ZOFRAN) 4 MG tablet Take 1 tablet (4 mg total) by mouth every  8 (eight) hours as needed for nausea or vomiting. 20 tablet 0  . pantoprazole (PROTONIX) 40 MG tablet Take 1 tablet (40 mg total) by mouth daily. 30 minutes before breakfast 30 tablet 3  . potassium chloride (KLOR-CON) 10 MEQ tablet Take 4 tablets (40 mEq total) by mouth daily. 12 tablet 0  . topiramate (TOPAMAX) 100 MG tablet Take 100 mg by mouth at bedtime.     . traMADol (ULTRAM) 50 MG tablet Take 1 tablet (50 mg total) by mouth every 6 (six) hours as needed. 15 tablet 0  . vitamin C (ASCORBIC ACID) 500 MG tablet Take 500 mg by mouth daily.    Marland Kitchen VITAMIN E PO Take 1 tablet by mouth daily.     Social History   Socioeconomic History  . Marital status: Single     Spouse name: Not on file  . Number of children: Not on file  . Years of education: Not on file  . Highest education level: Not on file  Occupational History  . Occupation: Custodian     Employer: [employer]  Tobacco Use  . Smoking status: Former Smoker    Packs/day: 1.00    Years: 20.00    Pack years: 20.00    Types: Cigarettes    Quit date: 2020    Years since quitting: 1.3  . Smokeless tobacco: Former Systems developer  . Tobacco comment: Quit smoking x 2 years    07/2015   SOMETIIMES i USE VAPOR   Substance and Sexual Activity  . Alcohol use: Never  . Drug use: Never  . Sexual activity: Not Currently  Other Topics Concern  . Not on file  Social History Narrative   Divorced   Social Determinants of Health   Financial Resource Strain:   . Difficulty of Paying Living Expenses:   Food Insecurity:   . Worried About Charity fundraiser in the Last Year:   . Arboriculturist in the Last Year:   Transportation Needs:   . Film/video editor (Medical):   Marland Kitchen Lack of Transportation (Non-Medical):   Physical Activity:   . Days of Exercise per Week:   . Minutes of Exercise per Session:   Stress:   . Feeling of Stress :   Social Connections:   . Frequency of Communication with Friends and Family:   . Frequency of Social Gatherings with Friends and Family:   . Attends Religious Services:   . Active Member of Clubs or Organizations:   . Attends Archivist Meetings:   Marland Kitchen Marital Status:   Intimate Partner Violence:   . Fear of Current or Ex-Partner:   . Emotionally Abused:   Marland Kitchen Physically Abused:   . Sexually Abused:    Family History  Problem Relation Age of Onset  . Aneurysm Mother   . Hypertension Mother   . Prostate cancer Father   . Hypertension Father   . Colon cancer Neg Hx   . Colon polyps Neg Hx     OBJECTIVE:  Vitals:   12/07/19 1222  BP: 116/74  Pulse: (!) 58  Resp: 18  Temp: 98.3 F (36.8 C)  SpO2: 98%   General appearance: Alert in no  acute distress HEENT: NCAT.  Oropharynx clear.  Lungs: clear to auscultation bilaterally without adventitious breath sounds Heart: regular rate and rhythm.   Abdomen: soft; non-distended; no tenderness; bowel sounds present; no guarding Back: no CVA tenderness Rectal: Declines Extremities: no edema; symmetrical with no gross deformities  Skin: warm and dry Neurologic: Ambulates from chair to exam table without difficulty Psychological: alert and cooperative; normal mood and affect; patient has hard time giving hx of present illness, needs to be redirected  Labs Reviewed  POCT URINALYSIS DIP (MANUAL ENTRY) - Abnormal; Notable for the following components:      Result Value   Blood, UA trace-intact (*)    All other components within normal limits    ASSESSMENT & PLAN:  1. Painful urination     Meds ordered this encounter  Medications  . tamsulosin (FLOMAX) 0.4 MG CAPS capsule    Sig: Take 1 capsule (0.4 mg total) by mouth daily after breakfast.    Dispense:  10 capsule    Refill:  0    Order Specific Question:   Supervising Provider    Answer:   Raylene Everts [id number]  . phenazopyridine (PYRIDIUM) 200 MG tablet    Sig: Take 1 tablet (200 mg total) by mouth 3 (three) times daily.    Dispense:  20 tablet    Refill:  0    Order Specific Question:   Supervising Provider    Answer:   Raylene Everts [id number]   Urine did not show signs of UTI.   Urine culture sent.  We will call you with abnormal results.   Push fluids and get plenty of rest.   Flomax prescribed.  This will help facilitate urine output Pyridium prescribed.  Take as needed for discomfort with urination Follow up with urology on 5/24 for further evaluation and management Return here or go to ER if you have any new or worsening symptoms such as fever, abdominal pain, nausea/vomiting, flank pain, worsening symptoms despite treatment, etc...  Outlined signs and symptoms indicating need for more acute  intervention. Patient verbalized understanding. After Visit Summary given.     Lestine Box, PA-C 12/07/19 1314

## 2019-12-08 ENCOUNTER — Telehealth: Payer: Self-pay | Admitting: Urology

## 2019-12-08 NOTE — Telephone Encounter (Signed)
Pt called to confirm MD would be able to see his CT report. Ct image in is epic and pt has appt scheduled for may 21st.

## 2019-12-08 NOTE — Telephone Encounter (Signed)
Requests a nurse return his call.

## 2019-12-08 NOTE — Telephone Encounter (Signed)
Called pt. No answer. No way to leave message.  

## 2019-12-09 ENCOUNTER — Ambulatory Visit: Payer: Medicare Other | Admitting: "Endocrinology

## 2019-12-09 LAB — URINE CULTURE: Special Requests: NORMAL

## 2019-12-10 ENCOUNTER — Other Ambulatory Visit: Payer: Self-pay

## 2019-12-10 ENCOUNTER — Other Ambulatory Visit: Payer: Self-pay | Admitting: Urology

## 2019-12-10 DIAGNOSIS — Z8546 Personal history of malignant neoplasm of prostate: Secondary | ICD-10-CM

## 2019-12-10 LAB — PSA: PSA: 1.8 ng/mL (ref <=4.0)

## 2019-12-11 ENCOUNTER — Ambulatory Visit: Payer: BC Managed Care – PPO | Admitting: Physician Assistant

## 2019-12-12 ENCOUNTER — Telehealth: Payer: Self-pay

## 2019-12-12 ENCOUNTER — Other Ambulatory Visit: Payer: Self-pay

## 2019-12-12 ENCOUNTER — Ambulatory Visit (INDEPENDENT_AMBULATORY_CARE_PROVIDER_SITE_OTHER): Payer: BC Managed Care – PPO | Admitting: Urology

## 2019-12-12 ENCOUNTER — Encounter: Payer: Self-pay | Admitting: Urology

## 2019-12-12 VITALS — BP 124/76 | HR 65 | Temp 98.2°F | Ht 66.0 in | Wt 170.0 lb

## 2019-12-12 DIAGNOSIS — R3912 Poor urinary stream: Secondary | ICD-10-CM | POA: Diagnosis not present

## 2019-12-12 DIAGNOSIS — N138 Other obstructive and reflux uropathy: Secondary | ICD-10-CM | POA: Diagnosis not present

## 2019-12-12 DIAGNOSIS — Z8546 Personal history of malignant neoplasm of prostate: Secondary | ICD-10-CM | POA: Diagnosis not present

## 2019-12-12 DIAGNOSIS — N401 Enlarged prostate with lower urinary tract symptoms: Secondary | ICD-10-CM | POA: Diagnosis not present

## 2019-12-12 LAB — POCT URINALYSIS DIPSTICK
Bilirubin, UA: NEGATIVE
Blood, UA: NEGATIVE
Glucose, UA: NEGATIVE
Ketones, UA: NEGATIVE
Leukocytes, UA: NEGATIVE
Nitrite, UA: NEGATIVE
Protein, UA: NEGATIVE
Spec Grav, UA: <=1.005 — AB (ref 1.010–1.025)
Urobilinogen, UA: NEGATIVE E.U./dL — AB
pH, UA: 7.5 (ref 5.0–8.0)

## 2019-12-12 MED ORDER — TAMSULOSIN HCL 0.4 MG PO CAPS: 0.4000 mg | ORAL_CAPSULE | Freq: Every day | ORAL | 3 refills | Status: DC

## 2019-12-12 NOTE — Patient Instructions (Signed)
Prostate Cancer  The prostate is a male gland that helps make semen. Prostate cancer is when abnormal cells grow in this gland. Follow these instructions at home:  Take over-the-counter and prescription medicines only as told by your doctor.  Eat a healthy diet.  Get plenty of sleep.  Ask your doctor for help to find a support group for men with prostate cancer.  Keep all follow-up visits as told by your doctor. This is important.  If you have to go to the hospital, let your cancer doctor (oncologist) know.  Touch, hold, hug, and caress your partner to continue to show sexual feelings. Contact a doctor if:  You have trouble peeing (urinating).  You have blood in your pee (urine).  You have pain in your hips, back, or chest. Get help right away if:  You have weakness in your legs.  You lose feeling (have numbness) in your legs.  You cannot control your pee or your poop (stool).  You have trouble breathing.  You have sudden pain in your chest.  You have chills or a fever. Summary  The prostate is a male gland that helps make semen. Prostate cancer is when abnormal cells grow in this gland.  Ask your doctor for help to find a support group for men with prostate cancer.  Contact a doctor if you have problems peeing or have any new pain that you did not have before. This information is not intended to replace advice given to you by your health care provider. Make sure you discuss any questions you have with your health care provider. Document Revised: 06/22/2017 Document Reviewed: 03/20/2016 Elsevier Patient Education  2020 Elsevier Inc.  

## 2019-12-12 NOTE — Telephone Encounter (Signed)
Pt left office voice mail his feet and hands are numb. Pt also complaining of weakness.  Please call [phone]   Thanks renee

## 2019-12-12 NOTE — Progress Notes (Signed)

## 2019-12-12 NOTE — Progress Notes (Signed)
12/12/2019 10:58 AM   [name] 10/15/1950 [id number]  Referring provider: Jake Samples, PA-C [address] [place],  [place] [address]  Prostate cancer  HPI: [name] is a [phone] here for followup for prostate cancer. PSA 1 month was 2.3. Since last visit he developed an anal fissure likely related to radiation therapy. Radiation therapy was completed in 2013. No worsening LUTS. He has nocturia 1-2x. Intermittent weak stream.  No hematuria. He underwent CT and/pelvic 12/04/2019 which showed a thick walled bladder. He was given rx for flomax but has not started the medication.    His records from [facility] are as follows: [name] returns today in f/u. He has a history of T1c Gleason 7(3+4) prostate cancer and completed radiation therapy in 3/13. His PSA was up to 1.9 at his last visit but he didn't get one prior to this visit. It was 1.2 before that. His PSA was 4.9 pretreatment. He is voiding ok with nocturia x 1 most nights. IPSS is 1. He has been using Tadalafil for ED and has sinus congestion but it isn't working now. The brand name Cialis did work. He had headaches with sildenafil. He was diagnosed with afib in August and has been in the ED 3 x with tachycardia, fatigue, weakness and sweats. He has also had issues with dizziness that has been difficult to determine the underlying cause. He has no other associated signs or symptoms.     PMH: Past Medical History:  Diagnosis Date  . Arthritis   . Atrial fibrillation ([place])   . Atrial flutter ([place])   . Cancer [facility])    prostate s/p radiation Mar 2013  . Chest pain   . Fracture 08/17/2015   MULTIPLE RIB FRACTURES     FROM FALL   . Hemorrhoids   . History of radiation therapy 08/22/11-10/13/11   prostate  . Hyperlipemia   . Hypertension     Surgical History: Past Surgical History:  Procedure Laterality Date  . COLONOSCOPY N/A 12/19/2012   [id number] bleeding secondary to radiation induced proctitis  - status post APC  ablation; internal hemorrhoids. Normal appearing colon  . COLONOSCOPY WITH PROPOFOL N/A 10/23/2019   Procedure: COLONOSCOPY WITH PROPOFOL;  Surgeon: Daneil Dolin, MD; external and grade 2 internal hemorrhoids, abnormal rectal blood vessels consistent with radiation proctitis s/p APC therapy, otherwise normal exam.  . HOT HEMOSTASIS  10/23/2019   Procedure: HOT HEMOSTASIS (ARGON PLASMA COAGULATION/BICAP);  Surgeon: Daneil Dolin, MD;  [facility];  Service: Endoscopy;;  apc rectal proctitis    . [place]  . RADIOACTIVE SEED IMPLANT     Prostate    Home Medications:  Allergies as of 12/12/2019   No Known Allergies     Medication List       Accurate as of Dec 12, 2019 10:58 AM. If you have any questions, ask your nurse or doctor.        amLODipine 10 MG tablet Commonly known as: NORVASC Take 1 tablet (10 mg total) by mouth daily.   amLODipine 10 MG tablet Commonly known as: NORVASC   Eliquis 5 MG Tabs tablet Generic drug: apixaban   apixaban 5 MG Tabs tablet Commonly known as: Eliquis Take 1 tablet (5 mg total) by mouth 2 (two) times daily.   B-12 PO Take 1 tablet by mouth daily.   ciprofloxacin 500 MG tablet Commonly known as: CIPRO Take 500 mg by mouth 2 (two) times daily.   diazepam  5 MG tablet Commonly known as: VALIUM Take 5 mg by mouth daily as needed for anxiety.   dicyclomine 20 MG tablet Commonly known as: BENTYL   gabapentin 300 MG capsule Commonly known as: NEURONTIN Take 300 mg by mouth 3 (three) times daily.   gabapentin 300 MG capsule Commonly known as: NEURONTIN   hydrochlorothiazide 25 MG tablet Commonly known as: HYDRODIURIL Take 25 mg by mouth daily.   hydrochlorothiazide 25 MG tablet Commonly known as: HYDRODIURIL Take by mouth.   hydrocortisone 2.5 % cream   Lidocaine (Anorectal) 5 % Gel Apply 1 application topically 3 (three) times daily as needed (rectal pain).   Linzess 72 MCG capsule Generic drug:  linaclotide Take 72 mcg by mouth every morning.   losartan 50 MG tablet Commonly known as: COZAAR Take 1 tablet (50 mg total) by mouth daily.   methimazole 5 MG tablet Commonly known as: TAPAZOLE Take 1 tablet (5 mg total) by mouth daily.   metoprolol tartrate 25 MG tablet Commonly known as: LOPRESSOR Take 1 tablet (25 mg total) by mouth 2 (two) times daily.   Nexletol 180 MG Tabs Generic drug: Bempedoic Acid Take 1 tablet by mouth daily.   ondansetron 4 MG tablet Commonly known as: Zofran Take 1 tablet (4 mg total) by mouth every 8 (eight) hours as needed for nausea or vomiting.   pantoprazole 40 MG tablet Commonly known as: PROTONIX Take 1 tablet (40 mg total) by mouth daily. 30 minutes before breakfast   phenazopyridine 200 MG tablet Commonly known as: PYRIDIUM Take 1 tablet (200 mg total) by mouth 3 (three) times daily.   potassium chloride 10 MEQ tablet Commonly known as: KLOR-CON Take 4 tablets (40 mEq total) by mouth daily.   potassium chloride SA 20 MEQ tablet Commonly known as: KLOR-CON Take 20 mEq by mouth 2 (two) times daily.   tamsulosin 0.4 MG Caps capsule Commonly known as: Flomax Take 1 capsule (0.4 mg total) by mouth daily after breakfast.   topiramate 100 MG tablet Commonly known as: TOPAMAX Take 100 mg by mouth at bedtime.   traMADol 50 MG tablet Commonly known as: Ultram Take 1 tablet (50 mg total) by mouth every 6 (six) hours as needed.   vitamin C 500 MG tablet Commonly known as: ASCORBIC ACID Take 500 mg by mouth daily.   Vitamin D3 125 MCG (5000 UT) Caps Take 1 capsule by mouth daily.   VITAMIN E PO Take 1 tablet by mouth daily.       Allergies: No Known Allergies  Family History: Family History  Problem Relation Age of Onset  . Aneurysm Mother   . Hypertension Mother   . Prostate cancer Father   . Hypertension Father   . Colon cancer Neg Hx   . Colon polyps Neg Hx     Social History:  reports that he quit smoking  about 16 months ago. His smoking use included cigarettes. He has a 20.00 pack-year smoking history. He has quit using smokeless tobacco. He reports that he does not drink alcohol or use drugs.  ROS: All other review of systems were reviewed and are negative except what is noted above in HPI  Physical Exam: BP 124/76   Pulse 65   Temp 98.2 F (36.8 C)   Ht 5\' 6"  (1.676 m)   Wt 170 lb (77.1 kg)   BMI 27.44 kg/m   Constitutional:  Alert and oriented, No acute distress. HEENT: [place] AT, moist mucus membranes.  Trachea midline, no  masses. Cardiovascular: No clubbing, cyanosis, or edema. Respiratory: Normal respiratory effort, no increased work of breathing. GI: Abdomen is soft, nontender, nondistended, no abdominal masses GU: No CVA tenderness.  Lymph: No cervical or inguinal lymphadenopathy. Skin: No rashes, bruises or suspicious lesions. Neurologic: Grossly intact, no focal deficits, moving all 4 extremities. Psychiatric: Normal mood and affect.  Laboratory Data: Lab Results  Component Value Date   WBC 5.0 10/22/2019   HGB 13.3 10/22/2019   HCT 40.6 10/22/2019   MCV 88.5 10/22/2019   PLT 241 10/22/2019    Lab Results  Component Value Date   CREATININE 0.95 11/10/2019    Lab Results  Component Value Date   PSA 2.3 11/10/2019    No results found for: TESTOSTERONE  No results found for: HGBA1C  Urinalysis    Component Value Date/Time   COLORURINE STRAW (A) 04/17/2019 1913   APPEARANCEUR CLEAR 04/17/2019 1913   LABSPEC 1.003 (L) 04/17/2019 1913   PHURINE 7.0 04/17/2019 1913   GLUCOSEU NEGATIVE 04/17/2019 1913   HGBUR NEGATIVE 04/17/2019 1913   BILIRUBINUR negative 12/07/2019 1237   KETONESUR negative 12/07/2019 [place] 04/17/2019 1913   PROTEINUR negative 12/07/2019 1237   PROTEINUR NEGATIVE 04/17/2019 1913   UROBILINOGEN 0.2 12/07/2019 1237   UROBILINOGEN 1.0 08/03/2012 1156   NITRITE Negative 12/07/2019 1237   NITRITE NEGATIVE 04/17/2019  1913   LEUKOCYTESUR Negative 12/07/2019 1237   LEUKOCYTESUR NEGATIVE 04/17/2019 1913    No results found for: LABMICR, [place], RBCUA, LABEPIT, MUCUS, BACTERIA  Pertinent Imaging: CT abd/pelvic 5/13: Images reviewed and discussed with the patient Results for orders placed during the hospital encounter of 11/07/19  DG Abdomen 1 View   Narrative CLINICAL DATA:  Constipation. Hemorrhoids. Irritable bowel syndrome. Prostate cancer.  EXAM: ABDOMEN - 1 VIEW  COMPARISON:  CT pelvis 09/25/2019 in CT abdomen 04/29/2019  FINDINGS: Oval-shaped densities along the midline of the upper abdomen, possibly in the stomach. The patient had gallstones on the 04/29/2019 exam but these densities appear more regular and more medially located than expected location for gallstones. There is another oval-shaped density projecting over the transverse colon and 1 along the rectum, again I suspect that these are within stomach/bowel.  The tiny punctate right kidney lower pole renal calculus shown on 04/29/2019 is not readily seen on today's conventional radiographs.  Dextroconvex lumbar scoliosis with rotary component. No dilated bowel. Bowel gas pattern appears otherwise unremarkable.  IMPRESSION: 1. Oval-shaped densities in the abdomen are thought to be in the stomach and bowel. 2. No dilated bowel.  Unremarkable bowel gas pattern. 3. Dextroconvex lumbar scoliosis with rotary component.   Electronically Signed   By: Van Clines M.D.   On: 11/07/2019 17:35    No results found for this or any previous visit. No results found for this or any previous visit. No results found for this or any previous visit. No results found for this or any previous visit. No results found for this or any previous visit. No results found for this or any previous visit. No results found for this or any previous visit.  Assessment & Plan:    1. Personal history of malignant neoplasm of prostate -CT did  not show evidence of metastatic disease. We will see him back in 3 months with PSA  2. BPH with LUTS, weak stream -Patient instructed to start flomax 0.4mg  daily   No follow-ups on file.  Nicolette Bang, MD  [facility]

## 2019-12-12 NOTE — Telephone Encounter (Signed)
Patient c/o several weeks of left hip, back and foot pain .He describes as burning sensation, worsened after he gets out of bath tub after sitting for long times in bath tub sitz bath for his hemorrhoids . Says he read topamax can cause tingling sensation so he will call his neurologist.    I will FYi Dr.Nishan

## 2019-12-15 ENCOUNTER — Telehealth: Payer: Self-pay | Admitting: Urology

## 2019-12-15 NOTE — Telephone Encounter (Signed)
Left VM for a nurse to return his call. He has questions about his last appt .

## 2019-12-15 NOTE — Telephone Encounter (Signed)
Pt called and stated he has questions about whether he should take the Rx Dr. Alyson Ingles had given him on his last appt. The mixture of the two Rx's that he has is making him feel weak. (Did not know the names of Rx). He also wanted to know his last PSA results.

## 2019-12-15 NOTE — Telephone Encounter (Signed)
fyi  Pt reports he  has been placed on antibiotic by his GI doctor that pt reports " they told me would make me feel week" per pt he was instructed to drink 64 oz of water daily and feels like some of his urinary issues are related to increase of fluids. Pt states he is unsure if the flomax that was written this past ov is any benefit to him. Encouraged pt to continue to medication a little bit longer before deciding to quit. Also recent psa number given to pt again ( he could not remember what Dr. Alyson Ingles had told him his last OV of 1.8) pt will return in 3 months with psa to see Dr. Jeffie Pollock as ordered per Dr. Alyson Ingles

## 2019-12-16 ENCOUNTER — Telehealth: Payer: Self-pay | Admitting: Internal Medicine

## 2019-12-16 NOTE — Telephone Encounter (Signed)
Pt returned call. Pt wanted [place] to know that [facility] doctor [facility] wants to do botox therapy to treat his rectal issues. Pt states the treatment last for 6-8 weeks. Pt is still losing weight an appetite isn't that good. Pt wants to make sure his potassium level is ok for him to have the botox treatment since pt will have some sedation. FYI pt wanted to mention that his PCP has him on an antibiotic due to pt feeling he had a UTI. Pts urologist explained to pt that his prostate was enlarged and his PSA level had been elevated in the past. Pt had his PDA level rechecked and it was better than it had been. Pt is now taking Flomax which was given by his Urologist to help with the prostate.

## 2019-12-16 NOTE — Telephone Encounter (Signed)
Lmom, waiting on a return call.  

## 2019-12-16 NOTE — Telephone Encounter (Signed)
Pt called asking for nurse. I told him the nurse was with a patient. Then he said he wanted to speak with [facility]. I told him that [facility] was not in the office today and the nurse would call him back. [phone]

## 2019-12-16 NOTE — Telephone Encounter (Signed)
I can not comment on his potassium level as we have not checked his potassium in several weeks. He will need to follow-up with his PCP as they should be monitoring his electrolytes. Additionally, the surgeon should be aware of his electrolyte abnormalities in the past, and they often have labs completed prior to procedures. He should follow-up with PCP and discuss his concerns with the surgeon performing the procedure.

## 2019-12-16 NOTE — Telephone Encounter (Signed)
Spoke with pt. Pt is aware of [place] recommendations to f/u with his PCP and the surgeon with any questions or concerns.

## 2019-12-19 ENCOUNTER — Telehealth: Payer: Self-pay | Admitting: Internal Medicine

## 2019-12-19 NOTE — Telephone Encounter (Signed)
I spoke with the patient and he stated that he had concerns that Dr. Orest Dikes office charged him a $300 deposit.  I made him aware that it is possible that he may have a deductible and he should call Dr. Orest Dikes office if he had more questions regarding the deposit he paid.  He did go on and stated that he paid the deposit for his procedure and his rectal pain is not as bad as it was in the past, however he is still going to have his procedure.   Routing to [place] as a FYI.

## 2019-12-19 NOTE — Telephone Encounter (Signed)
Pt called insisting that [facility] speak to him because it was very important and has questions for her. I told him several times that [facility] was with patients and the nurse was not available at the moment because we have patients being seen. I offered to take a message and have nurse call, but he kept saying he needed to speak with [facility]. I transferred call to nurse's VM. Please call him back at [phone]

## 2019-12-19 NOTE — Telephone Encounter (Signed)
Noted  

## 2019-12-26 ENCOUNTER — Telehealth: Payer: Self-pay

## 2019-12-26 NOTE — Telephone Encounter (Signed)
Pt has numbness in left foot and leg   Please call [phone]   Thanks renee

## 2019-12-26 NOTE — Telephone Encounter (Signed)
I have no input for this problem

## 2019-12-26 NOTE — Telephone Encounter (Signed)
Patient has apt with Neuro on 12/31/19, states he has no swelling, just tingling in left foot,notices after getting out of bath. He now believes his rectal fistula is causing this pain throughout his body .   I will FYI Dr Johnsie Cancel as patient wants input from his doctors.

## 2019-12-31 ENCOUNTER — Ambulatory Visit: Payer: Self-pay | Admitting: Surgery

## 2019-12-31 ENCOUNTER — Telehealth: Payer: Self-pay | Admitting: "Endocrinology

## 2019-12-31 NOTE — H&P (Signed)
CC: Anal pain, history of anal fissure  HPI: Steven [name] is a pleasant 35yoM whom has hx of HTN, Afib (on Eliquis), HLD, constipation, prostate ca (s/p brachytherapy XRT remotely) who is referred to our office from gastroenterology, Dr. Roseanne Kaufman PA, for evaluation of anal pain. He reports a personal history of anal fissure in the past. For the last 3-4 weeks, he has had sharp knifelike anal pains particularly when having bowel movements. There is some associated bright red blood per rectum with bowel movements. He has a history of atrial fibrillation and is on anticoagulation for this. He reports he is tried some topical creams as well as instilling foreign bodies in the anal canal to instill Preparation H. He reports that he was recently prescribed a topical compound medication-diltiazem-and had applied this 3-4 times a day for a week or 2. He denies any history of fever/chills. He has multiple complaints today including numbness in his left hand and feet when he has bowel movements. He denies nausea/vomiting or abdominal bloating.  By record review, he does have history of neovascular changes in the anterior rectal mucosa that were reported to be secondary to radiation-induced proctopathy. This was treated into 2014 with APC to the anterior rectal wall.  He underwent colonoscopy with Dr Gala Romney 10/23/19 which demonstrated multiple areas of radiation proctopathy in the distal rectum that were treated with APC. Remainder of the exam was unremarkable aside from having small hemorrhoids. He has been applying the topical nifedipine but recently ran out. Of until a week ago, he is still struggling with significant constipation and difficulty passing stool which was formed and hard. Every time he passes a hard bowel movement here to have anal pain. Starting 7 days ago, he was finally able to increase his MiraLAX to 2 caps of MiraLAX a day and is now having softer easier to pass bowel movements. He'll  still have intermittent knifelike sensations in the anal area when passing stool. He denies any fever/chills or persistent/prolonged perianal pain that doesn't subside after bowel movements.  INTERVAL HX He returns today for follow-up. His symptoms overall have improved some and he believes the nifedipine ointment has been helping a lot. He still has significant pain in the perianal area and also complains of more generalized issues including pain in his left hand, pain in his back, pain in his legs. He denies fever/chills. He denies any perianal pain persists more than 1 or 2 hours after bowel movements. He denies any blood per rectum  PMH: HTN (well controlled on oral antihypertensive), Afib (maintained on Eliquis), HLD, constipation, prostate ca (s/p brachytherapy 2017)  PSH: He denies any prior anorectal surgery/procedures  FHx: Denies FHx of malignancy  Social: Denies use of tobacco/EtOH/drugs  ROS: A comprehensive 10 system review of systems was completed with the patient and pertinent findings as noted above.  The patient is a 70 year old male.   Problem List/Past Medical Steven [name], CMA; 11/26/2019 9:42 AM) ANAL FISSURE (K60.2)  Steven [name] is a pleasant [phone] with hx of anal fissure, HTN, Afib (on Eliquis), HLD, constipation here with intermittent sharp anal pain particularly with defecation and evidence of beginnings of anterior midline anal fissure  Cscope 10/23/19 showed radiation proctopathy, treated with APC ATRIAL FIBRILLATION (I48.91)  RADIATION PROCTITIS (K62.7)  PROSTATE CANCER (C61)   Past Surgical History Steven [name], CMA; 11/26/2019 9:42 AM) No pertinent past surgical history   Diagnostic Studies History Steven [name], CMA; 11/26/2019 9:42 AM) Colonoscopy  5-10 years  ago  Allergies Steven [name], CMA; 11/26/2019 9:42 AM) No Known Allergies  [11/11/2019]: No Known Drug Allergies  [10/15/2019]:  Medication History Steven [name], CMA; 11/26/2019 9:44 AM) Anucort-HC (25MG  Suppository, Rectal) Active. Proctofoam HC (1-1% Foam, External) Active. predniSONE (10MG  (21) Tab Ther Pack, Oral) Active. hydroCHLOROthiazide (25MG  Tablet, Oral) Active. Dicyclomine HCl (20MG  Tablet, Oral) Active. Ondansetron HCl (4MG  Tablet, Oral) Active. Potassium Chloride Crys ER (20MEQ Tablet ER, Oral) Active. Losartan Potassium (100MG  Tablet, Oral) Active. Topiramate (25MG  Tablet, Oral) Active. MiraLax (17GM Packet, Oral) Active. Gabapentin (Oral) Specific strength unknown - Active. Metoprolol Tartrate (25MG  Tablet, Oral) Active. Eliquis (5MG  Tablet, Oral) Active. Gabapentin (300MG  Capsule, Oral) Active. amLODIPine Besylate (5MG  Tablet, Oral) Active. Medications Reconciled  Social History Steven [name], CMA; 11/26/2019 9:42 AM) Alcohol use  Recently quit alcohol use.  Family History Steven [name], CMA; 11/26/2019 9:42 AM) Hypertension  Father, Mother. Prostate Cancer  Father.  Other Problems Steven [name], CMA; 11/26/2019 9:42 AM) High blood pressure     Review of Systems Steven [name]; 11/26/2019 10:12 AM) General Present- Appetite Loss, Fatigue and Night Sweats. Not Present- Chills, Fever, Weight Gain and Weight Loss. Skin Not Present- Change in Wart/Mole, Dryness, Hives, Jaundice, New Lesions, Non-Healing Wounds, Rash and Ulcer. HEENT Not Present- Earache, Hearing Loss, Hoarseness, Nose Bleed, Oral Ulcers, Ringing in the Ears, Seasonal Allergies, Sinus Pain, Sore Throat, Visual Disturbances, Wears glasses/contact lenses and Yellow Eyes. Cardiovascular Present- Difficulty Breathing Lying Down and Rapid Heart Rate. Not Present- Chest Pain, Leg Cramps, Palpitations, Shortness of Breath and Swelling of Extremities. Gastrointestinal Present- Constipation, Hemorrhoids and Rectal Pain. Not Present- Abdominal Pain, Bloating, Bloody Stool, Change in Bowel Habits, Chronic diarrhea, Difficulty  Swallowing, Excessive gas, Gets full quickly at meals, Indigestion, Nausea and Vomiting. Male Genitourinary Not Present- Blood in Urine, Change in Urinary Stream, Frequency, Impotence, Nocturia, Painful Urination, Urgency and Urine Leakage. Musculoskeletal Not Present- Back Pain, Joint Pain, Joint Stiffness, Muscle Pain, Muscle Weakness and Swelling of Extremities. Neurological Present- Numbness and Tingling. Not Present- Decreased Memory, Fainting, Headaches, Seizures, Tremor, Trouble walking and Weakness. Psychiatric Not Present- Anxiety, Bipolar, Change in Sleep Pattern, Depression, Fearful and Frequent crying. Endocrine Not Present- Cold Intolerance, Excessive Hunger, Hair Changes, Heat Intolerance and New Diabetes. Hematology Present- Blood Thinners. Not Present- Easy Bruising, Excessive bleeding, Gland problems, HIV and Persistent Infections.  Vitals [employer] R. [name] CMA; 11/26/2019 9:41 AM) 11/26/2019 9:40 AM Weight: 177.25 lb Height: 66in Body Surface Area: 1.9 m Body Mass Index: 28.61 kg/m  Temp.: 98.86F (Oral)  Pulse: 62 (Regular)  BP: 128/80(Sitting, Left Arm, Standard)       Physical Exam Steven Gave M. Jamarea Selner [name]; 11/26/2019 10:15 AM) The physical exam findings are as follows: Note: Constitutional: No acute distress; conversant; wearing mask Eyes: Moist conjunctiva; no lid lag; anicteric sclerae Lungs: Normal respiratory effort CV: irreg rate/rhythm; no palpable thrill; no pitting edema Anorectal: Normal appearing perianal skin with tiny ext tags which are soft and flat. On gentle effacement, beginnings of anterior midline anal fissure noted. Dimple of granulation in posterior midline, query external opening of fistula? No erythema. No fluctuance. No tenderness to perianal palpation. After much discussion, opted to allow limited DRE which I could not palpate a mass with but tags as noted on colonoscopy retroflexion. MSK: Normal gait Psychiatric: Appropriate  affect; alert and oriented 3 **A chaperone, [employer], was present for this encounter    Assessment & Plan Steven Gave M. Ahsan Esterline [name]; 11/26/2019 10:19 AM) ANAL FISSURE (K60.2) Story:  Mr. Strohecker is a pleasant [phone] with hx of anal fissure, HTN, Afib (on Eliquis), HLD, constipation here with intermittent sharp anal pain particularly with defecation and evidence of beginnings of anterior midline anal fissure  Cscope 10/23/19 showed radiation proctopathy, treated with APC Impression: -Given his symptoms although somewhat improved but persistent, I recommended he proceed with anorectal exam under anesthesia. Initially, he did not want to pursue any sort of surgical intervention including an exam under anesthesia despite [place] discussing our concerns including the fact that this is not behaving like a typical anal fissure and potential for underlying neoplasm, although, his colonoscopy had a good evaluation of the remaining including a view in retroflexion. -He did however agree to proceed with surgery after we discussed that he is tried conservative management now for 6+ weeks and is still having significant issues and pain. He is scheduled for a CT abdomen next week at [facility] - we will plan to add a CT pelvis to be done with this. -The anatomy and physiology of the anal canal was discussed at length with the patient. The pathophysiology of anal fissure, anal fistula was discussed at length with associated pictures and illustrations. -We discussed anorectal exam under anesthesia, injection of Botox, possible fistulotomy, possible seton (based on intraop findings) -The planned procedure, material risks (including, but not limited to, pain, bleeding, infection, scarring, need for blood transfusion, damage to anal sphincter, incontinence of gas and/or stool, need for additional procedures, recurrence, pneumonia, heart attack, stroke, death) benefits and alternatives to surgery were discussed at length.  The patient's questions were answered to their satisfaction, they voiced understanding and elected to proceed with surgery. Additionally, we discussed typical postoperative expectations and the recovery process.  -Cardiac clearance, hold Eliquis perioperatively if ok with cardiologist  This patient encounter took 36 minutes today to perform the following: take history, perform exam, review outside records, interpret imaging, counsel the patient on their diagnosis and document encounter, findings & plan in the EHR  Signed by Ileana Roup, [name] (11/26/2019 10:19 AM)

## 2019-12-31 NOTE — Telephone Encounter (Signed)
Received pt's lab work results from [place], reviewed with Dr.Nida. Advised pt he needs to have lab work to check TSH, T4 free and T3 free per Dr.Nida and keep his appointment with [place] on 6-16. Pt voiced understanding.

## 2019-12-31 NOTE — Telephone Encounter (Signed)
Patient called and states [facility] is supposed to be faxing over his lab results that he got done and states his potassium is still high. He is requesting nurse to call him back. [phone]

## 2020-01-01 ENCOUNTER — Other Ambulatory Visit: Payer: Self-pay

## 2020-01-01 DIAGNOSIS — E061 Subacute thyroiditis: Secondary | ICD-10-CM

## 2020-01-02 LAB — TSH: TSH: 0.301 u[IU]/mL — ABNORMAL LOW (ref 0.450–4.500)

## 2020-01-02 LAB — T3, FREE: T3, Free: 2.8 pg/mL (ref 2.0–4.4)

## 2020-01-02 LAB — T4, FREE: Free T4: 1.37 ng/dL (ref 0.82–1.77)

## 2020-01-05 ENCOUNTER — Telehealth: Payer: Self-pay

## 2020-01-05 NOTE — Telephone Encounter (Signed)
Agree with recommendations to discuss urinary trouble with urology and or PCP and tingling sensation in his arm with PCP. Also agree with recommendations to discuss his concerns about Botox with [facility]. I do think this would be beneficial for him however.   Regarding constipation, he should continue MiraLAX BID and daily probiotic. He can try adding benefiber or metamucil to see if this helps his BMs feels more complete.

## 2020-01-05 NOTE — Telephone Encounter (Signed)
Pt called with c/o constipation. Pt states that he has constipation and is taking Miralax bid with a daily probiotic. Pt is having a BM 3 times daily. Pt feels when he has a BM, all of it doesn't come out. Stool isn't hard and comes out soft. Pt mention he is having some sensations with his urine and was asked to discuss those concerns with his urologist. Pt also mentioned that he is concerned with getting the Botox in his rectum and pt was advised to discuss this with [facility]. Pt said his arm has some tingling sensations and pt was advised to discuss this with his PCP. Pt says he feels all of this is related and he can feel the burning sensations when he urinates, in his rectum ect.

## 2020-01-05 NOTE — Telephone Encounter (Signed)
VM received this morning 01/05/20. Returned pts call and lmom. Waiting on a return call from pt.

## 2020-01-05 NOTE — Telephone Encounter (Signed)
Spoke with pt. Pt notified of [place] recommendations. Pt states he wants to stop going so much. Pt was advised to skip a dose of the Miralax to see if that helps. If pt is having a BM several times a day and wants to stop having so many Bms, pt may want to cut back on the Miralax.

## 2020-01-05 NOTE — Telephone Encounter (Signed)
Lmom, waiting on a return call.  

## 2020-01-06 ENCOUNTER — Encounter (HOSPITAL_COMMUNITY): Payer: Self-pay

## 2020-01-06 NOTE — Progress Notes (Addendum)
COVID Vaccine Completed: Yes Date COVID Vaccine completed: 11/2019 COVID vaccine manufacturer:    Moderna     PCP - Delman Cheadle P.A Cardiologist - Dr. Jenkins Rouge Last office visit with B. Strader 12/05/2019  Chest x-ray - 04/17/19 in epic EKG - 12/05/2019 in epic Stress Test - greater than 2 year ECHO - 03/02/2019 in epic Cardiac Cath - N/A  Sleep Study - N/A CPAP - N/A  Fasting Blood Sugar - N/A Checks Blood Sugar __N/A___ times a day  Blood Thinner Instructions: Eliquis last dose 01/11/20 Aspirin Instructions: N/A Last Dose: N/A  Anesthesia review: Atrial flutter, Atrial Fib, occ SOB laying down  Patient denies fever, cough and chest pain at PAT appointment   Patient verbalized understanding of instructions that were given to them at the PAT appointment. Patient was also instructed that they will need to review over the PAT instructions again at home before surgery.

## 2020-01-06 NOTE — Patient Instructions (Addendum)
DUE TO COVID-19 ONLY ONE VISITOR ARE ALLOWED TO COME WITH YOU AND STAY IN THE WAITING ROOM ONLY DURING PRE OP AND PROCEDURE. THEN TWO VISITORS MAY VISIT WITH YOU IN YOUR PRIVATE ROOM DURING VISITING HOURS ONLY!!   COVID SWAB TESTING MUST BE COMPLETED ON:   Saturday, January 10, 2020  At 12:15PM [address], [facility] pre surgical [facility] line (Must self quarantine after testing. Follow instructions on handout.)             Your procedure is scheduled on: Wednesday, January 14, 2020   Report to [facility] Main  Entrance    Report to admitting at 7:00 AM   Call this number if you have problems the morning of surgery [phone]   Do not eat food or drink liquids :After Midnight.  Oral Hygiene is also important to reduce your risk of infection.                                    Remember - BRUSH YOUR TEETH THE MORNING OF SURGERY WITH YOUR REGULAR TOOTHPASTE   Do NOT smoke after Midnight   Take these medicines the morning of surgery with A SIP OF WATER: Amlodipine, Methimazole, Metoprolol, Nexletol, Pantoprazole, Pregablin                               You may not have any metal on your body including  jewelry, and body piercings             Do not wear lotions, powders, perfumes/cologne, or deodorant                          Men may shave face and neck.   Do not bring valuables to the hospital. [place].   Contacts, dentures or bridgework may not be worn into surgery.    Patients discharged the day of surgery will not be allowed to drive home.   Special Instructions: Bring a copy of your healthcare power of attorney and living will documents         the day of surgery if you haven't scanned them in before.              Please read over the following fact sheets you were given: IF YOU HAVE QUESTIONS ABOUT YOUR PRE OP INSTRUCTIONS PLEASE CALL [phone]   [place] - Preparing  for Surgery Before surgery, you can play an important role.  Because skin is not sterile, your skin needs to be as free of germs as possible.  You can reduce the number of germs on your skin by washing with CHG (chlorahexidine gluconate) soap before surgery.  CHG is an antiseptic cleaner which kills germs and bonds with the skin to continue killing germs even after washing. Please DO NOT use if you have an allergy to CHG or antibacterial soaps.  If your skin becomes reddened/irritated stop using the CHG and inform your nurse when you arrive at Short Stay. Do not shave (including legs and underarms) for at least 48 hours prior to the first CHG shower.  You may shave your face/neck.  Please follow these instructions carefully:  1.  Shower  with CHG Soap the night before surgery and the  morning of surgery.  2.  If you choose to wash your hair, wash your hair first as usual with your normal  shampoo.  3.  After you shampoo, rinse your hair and body thoroughly to remove the shampoo.                             4.  Use CHG as you would any other liquid soap.  You can apply chg directly to the skin and wash.  Gently with a scrungie or clean washcloth.  5.  Apply the CHG Soap to your body ONLY FROM THE NECK DOWN.   Do   not use on face/ open                           Wound or open sores. Avoid contact with eyes, ears mouth and   genitals (private parts).                       Wash face,  Genitals (private parts) with your normal soap.             6.  Wash thoroughly, paying special attention to the area where your    surgery  will be performed.  7.  Thoroughly rinse your body with warm water from the neck down.  8.  DO NOT shower/wash with your normal soap after using and rinsing off the CHG Soap.                9.  Pat yourself dry with a clean towel.            10.  Wear clean pajamas.            11.  Place clean sheets on your bed the night of your first shower and do not  sleep with pets. Day of  Surgery : Do not apply any lotions/deodorants the morning of surgery.  Please wear clean clothes to the hospital/surgery center.  FAILURE TO FOLLOW THESE INSTRUCTIONS MAY RESULT IN THE CANCELLATION OF YOUR SURGERY  PATIENT SIGNATURE_________________________________  NURSE SIGNATURE__________________________________  ________________________________________________________________________

## 2020-01-07 ENCOUNTER — Other Ambulatory Visit: Payer: Self-pay

## 2020-01-07 ENCOUNTER — Ambulatory Visit (INDEPENDENT_AMBULATORY_CARE_PROVIDER_SITE_OTHER): Payer: BC Managed Care – PPO | Admitting: "Endocrinology

## 2020-01-07 ENCOUNTER — Telehealth: Payer: Self-pay | Admitting: Cardiovascular Disease

## 2020-01-07 ENCOUNTER — Encounter (HOSPITAL_COMMUNITY): Payer: Self-pay

## 2020-01-07 ENCOUNTER — Encounter (HOSPITAL_COMMUNITY)
Admission: RE | Admit: 2020-01-07 | Discharge: 2020-01-07 | Disposition: A | Discharge status: Home / Self Care | Payer: BC Managed Care – PPO | Source: Ambulatory Visit | Attending: Surgery | Admitting: Surgery

## 2020-01-07 ENCOUNTER — Encounter: Payer: Self-pay | Admitting: "Endocrinology

## 2020-01-07 VITALS — BP 143/78 | HR 56 | Ht 66.0 in | Wt 174.0 lb

## 2020-01-07 DIAGNOSIS — E061 Subacute thyroiditis: Secondary | ICD-10-CM | POA: Insufficient documentation

## 2020-01-07 HISTORY — DX: Thyrotoxicosis, unspecified without thyrotoxic crisis or storm: E05.90

## 2020-01-07 HISTORY — DX: Benign prostatic hyperplasia without lower urinary tract symptoms: N40.0

## 2020-01-07 HISTORY — DX: Dizziness and giddiness: R42

## 2020-01-07 HISTORY — DX: Personal history of urinary calculi: Z87.442

## 2020-01-07 HISTORY — DX: Anesthesia of skin: R20.0

## 2020-01-07 HISTORY — DX: Dyspnea, unspecified: R06.00

## 2020-01-07 HISTORY — DX: Gastro-esophageal reflux disease without esophagitis: K21.9

## 2020-01-07 HISTORY — DX: Anesthesia of skin: R20.2

## 2020-01-07 HISTORY — DX: Malignant neoplasm of prostate: C61

## 2020-01-07 MED ORDER — METHIMAZOLE 5 MG PO TABS: 5.0000 mg | ORAL_TABLET | Freq: Every day | ORAL | 3 refills | Status: DC

## 2020-01-07 NOTE — Telephone Encounter (Signed)
Patient states the tingling in his hands and feet are getting worse.  Was offered an appointment with Dr. Johnsie Cancel for tomorrow.  Wanted to speak to nurse first.

## 2020-01-07 NOTE — Progress Notes (Signed)
01/07/2020    Endocrinology follow-up note   Subjective:    Patient ID: [name], male    DOB: April 09, 1951, PCP Jake Samples, PA-C.   Past Medical History:  Diagnosis Date  . Arthritis   . Atrial fibrillation ([place])   . Atrial flutter ([place])   . BPH (benign prostatic hyperplasia)   . Cancer [facility])    prostate s/p radiation Mar 2013  . Chest pain   . Fracture 08/17/2015   MULTIPLE RIB FRACTURES     FROM FALL   . Hemorrhoids   . History of radiation therapy 08/22/11-10/13/11   prostate  . Hyperlipemia   . Hypertension     Past Surgical History:  Procedure Laterality Date  . 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 Normal appearing colon  . COLONOSCOPY WITH PROPOFOL N/A 10/23/2019   Procedure: COLONOSCOPY WITH PROPOFOL;  Surgeon: Daneil Dolin, MD; external and grade 2 internal hemorrhoids, abnormal rectal blood vessels consistent with radiation proctitis s/p APC therapy, otherwise normal exam.  . HOT HEMOSTASIS  10/23/2019   Procedure: HOT HEMOSTASIS (ARGON PLASMA COAGULATION/BICAP);  Surgeon: Daneil Dolin, MD;  Location: [facility];  Service: Endoscopy;;  apc rectal proctitis    . [place]  . RADIOACTIVE SEED IMPLANT     Prostate    Social History   Socioeconomic History  . Marital status: Single    Spouse name: Not on file  . Number of children: Not on file  . Years of education: Not on file  . Highest education level: Not on file  Occupational History  . Occupation: Custodian     Employer: [employer]  Tobacco Use  . Smoking status: Former Smoker    Packs/day: 1.00    Years: 20.00    Pack years: 20.00    Types: Cigarettes    Quit date: 2020    Years since quitting: 1.4  . Smokeless tobacco: Former Systems developer  . Tobacco comment: Quit smoking x 2 years    07/2015   SOMETIIMES i USE VAPOR   Vaping Use  . Vaping Use:  Never used  Substance and Sexual Activity  . Alcohol use: Never  . Drug use: Never  . Sexual activity: Not Currently  Other Topics Concern  . Not on file  Social History Narrative   Divorced   Social Determinants of Health   Financial Resource Strain:   . Difficulty of Paying Living Expenses:   Food Insecurity:   . Worried About Charity fundraiser in the Last Year:   . Arboriculturist in the Last Year:   Transportation Needs:   . Film/video editor (Medical):   Marland Kitchen Lack of Transportation (Non-Medical):   Physical Activity:   . Days of Exercise per Week:   . Minutes of Exercise per Session:   Stress:   . Feeling of Stress :   Social Connections:   . Frequency of Communication with Friends and Family:   . Frequency of Social Gatherings with Friends and Family:   . Attends Religious Services:   . Active Member of Clubs or Organizations:   . Attends Archivist Meetings:   Marland Kitchen Marital Status:     Family History  Problem Relation Age of Onset  . Aneurysm Mother   . Hypertension Mother   . Prostate cancer Father   . Hypertension Father   .  Colon cancer Neg Hx   . Colon polyps Neg Hx     Outpatient Encounter Medications as of 01/07/2020  Medication Sig  . amLODipine (NORVASC) 10 MG tablet Take 1 tablet (10 mg total) by mouth daily.  Marland Kitchen apixaban (ELIQUIS) 5 MG TABS tablet Take 1 tablet (5 mg total) by mouth 2 (two) times daily.  . Cholecalciferol (VITAMIN D3) 125 MCG (5000 UT) CAPS Take 1 capsule by mouth daily.  . Coenzyme Q10 (COQ10) 100 MG CAPS Take 100 mg by mouth daily.  . Cyanocobalamin (B-12 PO) Take 1 tablet by mouth daily.  . diazepam (VALIUM) 5 MG tablet Take 5 mg by mouth daily as needed for anxiety.   . hydrochlorothiazide (HYDRODIURIL) 25 MG tablet Take 25 mg by mouth daily.  . Lidocaine, Anorectal, 5 % GEL Apply 1 application topically 3 (three) times daily as needed (rectal pain). (Patient not taking: Reported on 12/30/2019)  . losartan (COZAAR) 50  MG tablet Take 1 tablet (50 mg total) by mouth daily. (Patient not taking: Reported on 12/30/2019)  . methimazole (TAPAZOLE) 5 MG tablet Take 1 tablet (5 mg total) by mouth daily.  . metoprolol tartrate (LOPRESSOR) 25 MG tablet Take 1 tablet (25 mg total) by mouth 2 (two) times daily.  Marland Kitchen NEXLETOL 180 MG TABS Take 180 mg by mouth daily.   . ondansetron (ZOFRAN) 4 MG tablet Take 1 tablet (4 mg total) by mouth every 8 (eight) hours as needed for nausea or vomiting. (Patient not taking: Reported on 12/30/2019)  . pantoprazole (PROTONIX) 40 MG tablet Take 1 tablet (40 mg total) by mouth daily. 30 minutes before breakfast  . potassium chloride SA (KLOR-CON) 20 MEQ tablet Take 20 mEq by mouth 2 (two) times daily.  . pregabalin (LYRICA) 75 MG capsule Take 75 mg by mouth 2 (two) times daily.  . Probiotic Product (PROBIOTIC-10 PO) Take 1 capsule by mouth daily.  Marland Kitchen topiramate (TOPAMAX) 100 MG tablet Take 100 mg by mouth at bedtime.   . traMADol (ULTRAM) 50 MG tablet Take 1 tablet (50 mg total) by mouth every 6 (six) hours as needed. (Patient not taking: Reported on 12/30/2019)  . VITAMIN E PO Take 1 tablet by mouth daily.  Marland Kitchen zinc gluconate 50 MG tablet Take 50 mg by mouth daily.  . [DISCONTINUED] methimazole (TAPAZOLE) 5 MG tablet Take 1 tablet (5 mg total) by mouth daily.  . [DISCONTINUED] phenazopyridine (PYRIDIUM) 200 MG tablet Take 1 tablet (200 mg total) by mouth 3 (three) times daily. (Patient not taking: Reported on 12/30/2019)  . [DISCONTINUED] tamsulosin (FLOMAX) 0.4 MG CAPS capsule Take 1 capsule (0.4 mg total) by mouth daily after breakfast. (Patient not taking: Reported on 12/30/2019)   No facility-administered encounter medications on file as of 01/07/2020.    ALLERGIES: No Known Allergies  VACCINATION STATUS:  There is no immunization history on file for this patient.   HPI  Steven [name] is 69 y.o. male who presents today with a medical history as above. he is being seen in follow-up after he  was seen in consultation for hyperthyroidism requested by Jake Samples, PA-C.   During his last visit, he was sent for thyroid uptake and scan which showed low normal uptake of 12.9%.    - he has been dealing with symptoms of weight loss of approximately 40 pounds, intermittent palpitations, heat intolerance, fatigue, for 3 to 6 months.  He was started on low-dose methimazole during his last visit, reports improvement in most of the  he still has the symptoms except the fact that his weight is stabilizing.  She has gained 4 pounds since last visit.   he denies dysphagia, choking, shortness of breath, no recent voice change.  He still has pain on the back of his neck.  he denies  family history of thyroid dysfunction. He  denies family hx of thyroid cancer. he denies personal history of goiter. he is not on any anti-thyroid medications nor on any thyroid hormone supplements.                          Review of systems  Constitutional: + Fluctuating body weight,  + fatigue, + subjective hyperthermia Eyes: no blurry vision, - xerophthalmia ENT: no sore throat, no nodules palpated in throat, no dysphagia/odynophagia, nor hoarseness Cardiovascular: no Chest Pain, no Shortness of Breath, +  Palpitations (he is currently on metoprolol 25 mg p.o. daily), no leg swelling Respiratory: no cough, no SOB Gastrointestinal: no Nausea, no Vomiting, no Diarhhea Musculoskeletal: no muscle/joint aches Skin: no rashes Neurological: +  tremors, no numbness, no tingling, no dizziness Psychiatric: no depression, +  anxiety   Objective:    BP (!) 143/78   Pulse (!) 56   Ht 5\' 6"  (1.676 m)   Wt 174 lb (78.9 kg)   BMI 28.08 kg/m   Wt Readings from Last 3 Encounters:  01/07/20 174 lb (78.9 kg)  12/12/19 170 lb (77.1 kg)  12/05/19 174 lb (78.9 kg)                                                Physical exam  Constitutional: Body mass index is 28.08 kg/m., not in acute distress, + stable state  of mind Eyes: PERRLA, EOMI, - exophthalmos ENT: moist mucous membranes, +  thyromegaly, no cervical lymphadenopathy Cardiovascular: - precordial activity, -tachycardic,  no Murmur/Rubs/Gallops Respiratory:  adequate breathing efforts, no gross chest deformity, Clear to auscultation bilaterally Gastrointestinal: abdomen soft, Non -tender, No distension, Bowel Sounds present Musculoskeletal: no gross deformities, strength intact in all four extremities Skin: moist, warm, no rashes Neurological: +  tremor with outstretched hands,  + Deep Tendon Reflexes  on both lower extremities.   CMP     Component Value Date/Time   NA 141 11/10/2019 1051   K 3.0 (L) 11/10/2019 1051   CL 102 11/10/2019 1051   CO2 23 11/10/2019 1051   GLUCOSE 106 (H) 11/10/2019 1051   GLUCOSE 103 (H) 10/23/2019 0529   BUN 6 (L) 11/10/2019 1051   CREATININE 0.95 11/10/2019 1051   CALCIUM 10.7 (H) 11/10/2019 1051   PROT 6.8 03/02/2019 0329   ALBUMIN 3.6 03/02/2019 0329   AST 20 03/02/2019 0329   ALT 20 03/02/2019 0329   ALKPHOS 51 03/02/2019 0329   BILITOT 0.6 03/02/2019 0329   GFRNONAA 82 11/10/2019 1051   GFRAA 95 11/10/2019 1051     CBC    Component Value Date/Time   WBC 5.0 10/22/2019 0507   RBC 4.59 10/22/2019 0507   HGB 13.3 10/22/2019 0507   HCT 40.6 10/22/2019 0507   PLT 241 10/22/2019 0507   MCV 88.5 10/22/2019 0507   MCH 29.0 10/22/2019 0507   MCHC 32.8 10/22/2019 0507   RDW 12.8 10/22/2019 0507   LYMPHSABS 1,844 09/30/2019 1229   MONOABS 0.4 04/17/2019 1743   EOSABS  62 09/30/2019 1229   BASOSABS 48 09/30/2019 1229   Lipid Panel     Component Value Date/Time   CHOL 142 03/02/2019 0329   TRIG 89 03/02/2019 0329   HDL 32 (L) 03/02/2019 0329   CHOLHDL 4.4 03/02/2019 0329   VLDL 18 03/02/2019 0329   LDLCALC 92 03/02/2019 0329     Lab Results  Component Value Date   TSH 0.301 (L) 01/01/2020   TSH 0.282 (L) 10/22/2019   TSH 0.843 03/02/2019   FREET4 1.37 01/01/2020   FREET4 1.37  (H) 10/22/2019    Thyroid uptake and scan on Nov 28, 2019 FINDINGS: There is fairly homogeneous thyroid uptake. The left lobe extends more inferiorly than the right, although no focal hot or cold nodules are identified.  4 hour I-123 uptake = 4.1% (normal 5-20%) 24 hour I-123 uptake = 12.9% (normal 10-30%)  IMPRESSION: 1. Low normal radioactive iodine uptake by the thyroid gland. 2. No focal hot or cold nodules identified.    Assessment & Plan:   1. Hyperthyroidism  -His presentation is more indicative of transient thyrotoxicosis from subacute thyroiditis.  His uptake is low normal at 12.9%, will not need ablative treatment with I-131.  His previsit thyroid function tests are consistent with treatment response and improvement.  Will be continued on Metformin 5 mg p.o. daily with breakfast and plan to repeat thyroid function test in 3 months.   -He is on metoprolol 25 mg p.o. daily, did not initiate any other beta-blocker.  His pulse rate is in the 60s.    -He will be considered for thyroid/neck ultrasound after his next visit.   -Patient is advised to maintain close follow up with Jake Samples, PA-C for primary care needs.      - Time spent on this patient care encounter:  20 minutes of which 50% was spent in  counseling and the rest reviewing  his current and  previous labs / studies and medications  doses and developing a plan for long term care. [name]  participated in the discussions, expressed understanding, and voiced agreement with the above plans.  All questions were answered to his satisfaction. he is encouraged to contact clinic should he have any questions or concerns prior to his return visit.    Follow up plan: Return in about 4 months (around 05/08/2020) for F/U with Pre-visit Labs.   Thank you for involving me in the care of this pleasant patient, and I will continue to update you with his progress.  Glade Lloyd, MD [facility]  [facility] Phone: [phone]  Fax: [phone]   01/07/2020, 9:56 AM  This note was partially dictated with voice recognition software. Similar sounding words can be transcribed inadequately or may not  be corrected upon review.

## 2020-01-08 ENCOUNTER — Encounter (HOSPITAL_COMMUNITY)
Admission: RE | Admit: 2020-01-08 | Discharge: 2020-01-08 | Disposition: A | Discharge status: Home / Self Care | Payer: BC Managed Care – PPO | Source: Ambulatory Visit | Attending: Surgery | Admitting: Surgery

## 2020-01-08 DIAGNOSIS — Z01812 Encounter for preprocedural laboratory examination: Secondary | ICD-10-CM | POA: Diagnosis not present

## 2020-01-08 LAB — CBC WITH DIFFERENTIAL/PLATELET
Abs Immature Granulocytes: 0.01 10*3/uL (ref 0.00–0.07)
Basophils Absolute: 0 10*3/uL (ref 0.0–0.1)
Basophils Relative: 1 %
Eosinophils Absolute: 0.1 10*3/uL (ref 0.0–0.5)
Eosinophils Relative: 2 %
HCT: 40.9 % (ref 39.0–52.0)
Hemoglobin: 13.2 g/dL (ref 13.0–17.0)
Immature Granulocytes: 0 %
Lymphocytes Relative: 38 %
Lymphs Abs: 1.6 10*3/uL (ref 0.7–4.0)
MCH: 29.7 pg (ref 26.0–34.0)
MCHC: 32.3 g/dL (ref 30.0–36.0)
MCV: 92.1 fL (ref 80.0–100.0)
Monocytes Absolute: 0.3 10*3/uL (ref 0.1–1.0)
Monocytes Relative: 8 %
Neutro Abs: 2.1 10*3/uL (ref 1.7–7.7)
Neutrophils Relative %: 51 %
Platelets: 240 10*3/uL (ref 150–400)
RBC: 4.44 MIL/uL (ref 4.22–5.81)
RDW: 12.7 % (ref 11.5–15.5)
WBC: 4.1 10*3/uL (ref 4.0–10.5)
nRBC: 0 % (ref 0.0–0.2)

## 2020-01-08 LAB — COMPREHENSIVE METABOLIC PANEL
ALT: 15 U/L (ref 0–44)
AST: 18 U/L (ref 15–41)
Albumin: 4.3 g/dL (ref 3.5–5.0)
Alkaline Phosphatase: 47 U/L (ref 38–126)
Anion gap: 8 (ref 5–15)
BUN: 11 mg/dL (ref 8–23)
CO2: 27 mmol/L (ref 22–32)
Calcium: 9.8 mg/dL (ref 8.9–10.3)
Chloride: 106 mmol/L (ref 98–111)
Creatinine, Ser: 0.87 mg/dL (ref 0.61–1.24)
GFR calc Af Amer: >60 mL/min (ref >60)
GFR calc non Af Amer: >60 mL/min (ref >60)
Glucose, Bld: 105 mg/dL — ABNORMAL HIGH (ref 70–99)
Potassium: 4.2 mmol/L (ref 3.5–5.1)
Sodium: 141 mmol/L (ref 135–145)
Total Bilirubin: 0.8 mg/dL (ref 0.3–1.2)
Total Protein: 7.4 g/dL (ref 6.5–8.1)

## 2020-01-08 LAB — APTT: aPTT: 36 seconds (ref 24–36)

## 2020-01-09 NOTE — Progress Notes (Signed)
Anesthesia Chart Review   Case: [id number] Date/Time: 01/14/20 0845   Procedures:      ANORECTAL EXAM UNDER ANESTHESIA WITH POSSIBLE FISTULOTOMY, POSSIBLE SETON (N/A )     INJECTION OF BOTOX INTO ANAL SPHINCTER (N/A )   Anesthesia type: General   Pre-op diagnosis: ANAL PAIN, ANAL FISSURE   Location: [place] / [facility] ORS   Surgeons: Ileana Roup, MD      DISCUSSION:69 y.o. former smoker (20 pack years, quit 07/24/18) with h/o HTN, GERD, a-fib (on Eliquis), hyperthyroidism, prostate cancer s/p radiation, anal pain, anal fissure scheduled for above procedure 01/14/2020 with Dr. Nadeen Landau.   Pt last seen by cardiology 12/05/2018 for preoperative evaluation.  Per OV note, "he denies any recent anginal symptoms and is able to perform 4 METS of activity. He does have a baseline abnormal EKG which resembles prior tracings and recent stress testing in 06/2019 was low-risk as outlined above. RCRI Risk is low with 0.4% risk of a major cardiac event. He is of acceptable risk to proceed without further cardiac testing at this time. He has already been cleared by pharmacy to hold Eliquis for 48 hours prior to surgery and to resume as soon as possible once safe from a surgical perspective. I will forward today's note to the requesting provider and office."  Anticipate pt can proceed with planned procedure barring acute status change.   VS: BP 118/75   Pulse (!) 56   Temp 36.8 C (Oral)   Resp 16   Ht 5\' 6"  (1.676 m)   Wt 78 kg   SpO2 100%   BMI 27.76 kg/m   PROVIDERS: Jake Samples, PA-C is PCP   Jenkins Rouge, MD is Cardiologist  LABS: Labs reviewed: Acceptable for surgery. (all labs ordered are listed, but only abnormal results are displayed)  Labs Reviewed  COMPREHENSIVE METABOLIC PANEL - Abnormal; Notable for the following components:      Result Value   Glucose, Bld 105 (*)    All other components within normal limits  APTT  CBC WITH DIFFERENTIAL/PLATELET      IMAGES:   EKG: 12/05/2019 Rate 62 bpm Sinus  Rhythm  Low voltage in precordial leads.   -Decreasing R-wave progression -may be secondary to pulmonary disease   consider old anterior infarct.   -  T-abnormality  CV: Stress Test 06/25/2019  No diagnostic ST segment changes to indicate ischemia.  Very small, mild intensity, apical anteroseptal defect that is partially reversible and most suggestive of variable soft tissue attenuation rather than minor ischemic territory. No large ischemic territories are noted.  This is a low risk study.  Nuclear stress EF: 62%.  Echo 03/02/2019 IMPRESSIONS    1. The left ventricle has normal systolic function with an ejection  fraction of 60-65%. The cavity size was normal. Left ventricular diastolic  parameters were normal.  2. The right ventricle has normal systolic function. The cavity was  normal. There is no increase in right ventricular wall thickness.  3. Mild thickening of the mitral valve leaflet.  4. The aortic valve is tricuspid. Mild thickening of the aortic valve.  Mild calcification of the aortic valve.  5. The aorta is normal in size and structure. Past Medical History:  Diagnosis Date  . Arthritis   . Atrial fibrillation ([place])   . Atrial flutter ([place])   . BPH (benign prostatic hyperplasia)   . Chest pain   . Dizziness   . Dyspnea    laying down occ  .  Fracture 08/17/2015   MULTIPLE RIB FRACTURES     FROM FALL   . GERD (gastroesophageal reflux disease)   . Hemorrhoids   . History of kidney stones    noted on CT scan  . History of radiation therapy 08/22/11-10/13/11   prostate  . Hyperlipemia   . Hypertension   . Hyperthyroidism   . Light headedness   . Numbness and tingling in left arm   . Numbness and tingling of both legs   . Prostate cancer [facility])    prostate s/p radiation Mar 2013    Past Surgical History:  Procedure Laterality Date  . COLONOSCOPY N/A 12/19/2012   [id number] 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 Normal appearing colon  . COLONOSCOPY WITH PROPOFOL N/A 10/23/2019   Procedure: COLONOSCOPY WITH PROPOFOL;  Surgeon: Daneil Dolin, MD; external and grade 2 internal hemorrhoids, abnormal rectal blood vessels consistent with radiation proctitis s/p APC therapy, otherwise normal exam.  . HOT HEMOSTASIS  10/23/2019   Procedure: HOT HEMOSTASIS (ARGON PLASMA COAGULATION/BICAP);  Surgeon: Daneil Dolin, MD;  [facility];  Service: Endoscopy;;  apc rectal proctitis    . KIDNEY SURGERY  1982   kidney tube collapse repair  . RADIOACTIVE SEED IMPLANT     Prostate    MEDICATIONS: . amLODipine (NORVASC) 10 MG tablet  . apixaban (ELIQUIS) 5 MG TABS tablet  . Cholecalciferol (VITAMIN D3) 125 MCG (5000 UT) CAPS  . Coenzyme Q10 (COQ10) 100 MG CAPS  . Cyanocobalamin (B-12 PO)  . diazepam (VALIUM) 5 MG tablet  . hydrochlorothiazide (HYDRODIURIL) 25 MG tablet  . Lidocaine, Anorectal, 5 % GEL  . losartan (COZAAR) 50 MG tablet  . methimazole (TAPAZOLE) 5 MG tablet  . metoprolol tartrate (LOPRESSOR) 25 MG tablet  . NEXLETOL 180 MG TABS  . ondansetron (ZOFRAN) 4 MG tablet  . pantoprazole (PROTONIX) 40 MG tablet  . potassium chloride SA (KLOR-CON) 20 MEQ tablet  . pregabalin (LYRICA) 75 MG capsule  . Probiotic Product (PROBIOTIC-10 PO)  . topiramate (TOPAMAX) 100 MG tablet  . traMADol (ULTRAM) 50 MG tablet  . VITAMIN E PO  . zinc gluconate 50 MG tablet   No current facility-administered medications for this encounter.

## 2020-01-09 NOTE — Anesthesia Preprocedure Evaluation (Addendum)
Anesthesia Evaluation  Patient identified by MRN, date of birth, ID band Patient awake    Reviewed: Allergy & Precautions, NPO status , Patient's Chart, lab work & pertinent test results, reviewed documented beta blocker date and time   History of Anesthesia Complications Negative for: history of anesthetic complications  Airway Mallampati: II  TM Distance: >3 FB Neck ROM: Full    Dental no notable dental hx.    Pulmonary former smoker,    Pulmonary exam normal        Cardiovascular hypertension, Pt. on medications and Pt. on home beta blockers Normal cardiovascular exam+ dysrhythmias (on Eliquis) Atrial Fibrillation   Stress echo 06/2019: no diagnostic ST segment changes to indicate ischemia, very small, mild intensity, apical anteroseptal defect that is partially reversible and most suggestive of variable soft tissue attenuation rather than minor ischemic territory. No large ischemic territories are noted, low risk study. EF 62%    Neuro/Psych negative neurological ROS  negative psych ROS   GI/Hepatic Neg liver ROS, GERD  Medicated and Controlled,ANAL FISSURE   Endo/Other  Hyperthyroidism   Renal/GU negative Renal ROS   Hx of prostate ca s/p radiation    Musculoskeletal  (+) Arthritis ,   Abdominal   Peds  Hematology negative hematology ROS (+)   Anesthesia Other Findings Day of surgery medications reviewed with patient.  Reproductive/Obstetrics negative OB ROS                           Anesthesia Physical Anesthesia Plan  ASA: II  Anesthesia Plan: General   Post-op Pain Management:    Induction: Intravenous  PONV Risk Score and Plan: 3 and Treatment may vary due to age or medical condition, Ondansetron, Dexamethasone and Midazolam  Airway Management Planned: LMA  Additional Equipment: None  Intra-op Plan:   Post-operative Plan: Extubation in OR  Informed Consent: I have  reviewed the patients History and Physical, chart, labs and discussed the procedure including the risks, benefits and alternatives for the proposed anesthesia with the patient or authorized representative who has indicated his/her understanding and acceptance.     Dental advisory given  Plan Discussed with: CRNA  Anesthesia Plan Comments: (See PAT note 01/08/2020, Konrad Felix, PA-C)     Anesthesia Quick Evaluation

## 2020-01-10 ENCOUNTER — Other Ambulatory Visit (HOSPITAL_COMMUNITY): Payer: BC Managed Care – PPO

## 2020-01-12 ENCOUNTER — Other Ambulatory Visit (HOSPITAL_COMMUNITY)
Admission: RE | Admit: 2020-01-12 | Discharge: 2020-01-12 | Disposition: A | Discharge status: Home / Self Care | Payer: BC Managed Care – PPO | Source: Ambulatory Visit | Attending: Surgery | Admitting: Surgery

## 2020-01-12 DIAGNOSIS — Z01812 Encounter for preprocedural laboratory examination: Secondary | ICD-10-CM | POA: Diagnosis present

## 2020-01-12 DIAGNOSIS — Z20822 Contact with and (suspected) exposure to covid-19: Secondary | ICD-10-CM | POA: Insufficient documentation

## 2020-01-12 LAB — SARS CORONAVIRUS 2 (TAT 6-24 HRS): SARS Coronavirus 2: NEGATIVE

## 2020-01-13 MED ORDER — BUPIVACAINE LIPOSOME 1.3 % IJ SUSP
20.0000 mL | Freq: Once | INTRAMUSCULAR | Status: DC
  Filled 2020-01-13: qty 20

## 2020-01-14 ENCOUNTER — Ambulatory Visit (HOSPITAL_COMMUNITY): Payer: BC Managed Care – PPO | Admitting: Physician Assistant

## 2020-01-14 ENCOUNTER — Ambulatory Visit (HOSPITAL_COMMUNITY): Payer: BC Managed Care – PPO | Admitting: Certified Registered Nurse Anesthetist

## 2020-01-14 ENCOUNTER — Encounter (HOSPITAL_COMMUNITY): Payer: Self-pay | Admitting: Surgery

## 2020-01-14 ENCOUNTER — Encounter (HOSPITAL_COMMUNITY)
Admission: RE | Disposition: A | Discharge status: Home / Self Care | Payer: Self-pay | Source: Home / Self Care | Attending: Surgery

## 2020-01-14 ENCOUNTER — Ambulatory Visit (HOSPITAL_COMMUNITY)
Admission: RE | Admit: 2020-01-14 | Discharge: 2020-01-14 | Disposition: A | Discharge status: Home / Self Care | Payer: BC Managed Care – PPO | Attending: Surgery | Admitting: Surgery

## 2020-01-14 DIAGNOSIS — K219 Gastro-esophageal reflux disease without esophagitis: Secondary | ICD-10-CM | POA: Insufficient documentation

## 2020-01-14 DIAGNOSIS — E0590 Thyrotoxicosis, unspecified without thyrotoxic crisis or storm: Secondary | ICD-10-CM | POA: Diagnosis not present

## 2020-01-14 DIAGNOSIS — I4891 Unspecified atrial fibrillation: Secondary | ICD-10-CM | POA: Insufficient documentation

## 2020-01-14 DIAGNOSIS — Z87442 Personal history of urinary calculi: Secondary | ICD-10-CM | POA: Diagnosis not present

## 2020-01-14 DIAGNOSIS — I10 Essential (primary) hypertension: Secondary | ICD-10-CM | POA: Diagnosis not present

## 2020-01-14 DIAGNOSIS — K5900 Constipation, unspecified: Secondary | ICD-10-CM | POA: Diagnosis not present

## 2020-01-14 DIAGNOSIS — Z87891 Personal history of nicotine dependence: Secondary | ICD-10-CM | POA: Insufficient documentation

## 2020-01-14 DIAGNOSIS — Z923 Personal history of irradiation: Secondary | ICD-10-CM | POA: Insufficient documentation

## 2020-01-14 DIAGNOSIS — M1990 Unspecified osteoarthritis, unspecified site: Secondary | ICD-10-CM | POA: Insufficient documentation

## 2020-01-14 DIAGNOSIS — Z8249 Family history of ischemic heart disease and other diseases of the circulatory system: Secondary | ICD-10-CM | POA: Diagnosis not present

## 2020-01-14 DIAGNOSIS — K602 Anal fissure, unspecified: Secondary | ICD-10-CM | POA: Insufficient documentation

## 2020-01-14 DIAGNOSIS — Z7901 Long term (current) use of anticoagulants: Secondary | ICD-10-CM | POA: Insufficient documentation

## 2020-01-14 DIAGNOSIS — E785 Hyperlipidemia, unspecified: Secondary | ICD-10-CM | POA: Insufficient documentation

## 2020-01-14 DIAGNOSIS — K625 Hemorrhage of anus and rectum: Secondary | ICD-10-CM | POA: Diagnosis not present

## 2020-01-14 DIAGNOSIS — N400 Enlarged prostate without lower urinary tract symptoms: Secondary | ICD-10-CM | POA: Diagnosis not present

## 2020-01-14 DIAGNOSIS — Z8042 Family history of malignant neoplasm of prostate: Secondary | ICD-10-CM | POA: Insufficient documentation

## 2020-01-14 DIAGNOSIS — R200 Anesthesia of skin: Secondary | ICD-10-CM | POA: Insufficient documentation

## 2020-01-14 DIAGNOSIS — Z8546 Personal history of malignant neoplasm of prostate: Secondary | ICD-10-CM | POA: Insufficient documentation

## 2020-01-14 HISTORY — PX: EVALUATION UNDER ANESTHESIA WITH ANAL FISTULECTOMY: SHX5621

## 2020-01-14 HISTORY — PX: BOTOX INJECTION: SHX5754

## 2020-01-14 LAB — PROTIME-INR
INR: 1 (ref 0.8–1.2)
Prothrombin Time: 13.1 seconds (ref 11.4–15.2)

## 2020-01-14 SURGERY — EXAM UNDER ANESTHESIA WITH ANAL FISTULECTOMY
Anesthesia: General

## 2020-01-14 MED ORDER — TRAMADOL HCL 50 MG PO TABS: 50.0000 mg | ORAL_TABLET | Freq: Four times a day (QID) | ORAL | 0 refills | Status: AC | PRN

## 2020-01-14 MED ORDER — ORAL CARE MOUTH RINSE: 15.0000 mL | Freq: Once | OROMUCOSAL | Status: AC

## 2020-01-14 MED ORDER — ACETAMINOPHEN 500 MG PO TABS
1000.0000 mg | ORAL_TABLET | ORAL | Status: AC
  Administered 2020-01-14: 1000 mg via ORAL

## 2020-01-14 MED ORDER — BUPIVACAINE-EPINEPHRINE (PF) 0.5% -1:200000 IJ SOLN
INTRAMUSCULAR | Status: DC | PRN
  Administered 2020-01-14: 10 mL

## 2020-01-14 MED ORDER — ONDANSETRON HCL 4 MG/2ML IJ SOLN
INTRAMUSCULAR | Status: DC | PRN
  Administered 2020-01-14: 4 mg via INTRAVENOUS

## 2020-01-14 MED ORDER — BUPIVACAINE LIPOSOME 1.3 % IJ SUSP
INTRAMUSCULAR | Status: DC | PRN
  Administered 2020-01-14: 20 mL

## 2020-01-14 MED ORDER — ONDANSETRON HCL 4 MG/2ML IJ SOLN
INTRAMUSCULAR | Status: AC
  Filled 2020-01-14: qty 2

## 2020-01-14 MED ORDER — CHLORHEXIDINE GLUCONATE CLOTH 2 % EX PADS: 6.0000 | MEDICATED_PAD | Freq: Once | CUTANEOUS | Status: DC

## 2020-01-14 MED ORDER — ONABOTULINUMTOXINA 100 UNITS IJ SOLR: 100.0000 [IU] | Freq: Once | INTRAMUSCULAR | Status: DC

## 2020-01-14 MED ORDER — ONABOTULINUMTOXINA 100 UNITS IJ SOLR
INTRAMUSCULAR | Status: DC | PRN
  Administered 2020-01-14: 100 [IU] via INTRAMUSCULAR

## 2020-01-14 MED ORDER — MIDAZOLAM HCL 5 MG/5ML IJ SOLN
INTRAMUSCULAR | Status: DC | PRN
  Administered 2020-01-14: 2 mg via INTRAVENOUS

## 2020-01-14 MED ORDER — PROPOFOL 10 MG/ML IV BOLUS
INTRAVENOUS | Status: AC
  Filled 2020-01-14: qty 20

## 2020-01-14 MED ORDER — FENTANYL CITRATE (PF) 100 MCG/2ML IJ SOLN
INTRAMUSCULAR | Status: DC | PRN
  Administered 2020-01-14 (×2): 50 ug via INTRAVENOUS

## 2020-01-14 MED ORDER — PROPOFOL 10 MG/ML IV BOLUS
INTRAVENOUS | Status: DC | PRN
  Administered 2020-01-14: 150 mg via INTRAVENOUS

## 2020-01-14 MED ORDER — BUPIVACAINE HCL (PF) 0.5 % IJ SOLN
INTRAMUSCULAR | Status: AC
  Filled 2020-01-14: qty 30

## 2020-01-14 MED ORDER — SODIUM CHLORIDE (PF) 0.9 % IJ SOLN
INTRAMUSCULAR | Status: AC
  Filled 2020-01-14: qty 10

## 2020-01-14 MED ORDER — CHLORHEXIDINE GLUCONATE 0.12 % MT SOLN
15.0000 mL | Freq: Once | OROMUCOSAL | Status: AC
  Administered 2020-01-14: 15 mL via OROMUCOSAL

## 2020-01-14 MED ORDER — ONABOTULINUMTOXINA 100 UNITS IJ SOLR
INTRAMUSCULAR | Status: AC
  Filled 2020-01-14: qty 100

## 2020-01-14 MED ORDER — DEXAMETHASONE SODIUM PHOSPHATE 10 MG/ML IJ SOLN
INTRAMUSCULAR | Status: AC
  Filled 2020-01-14: qty 1

## 2020-01-14 MED ORDER — ACETAMINOPHEN 500 MG PO TABS
1000.0000 mg | ORAL_TABLET | Freq: Once | ORAL | Status: DC
  Filled 2020-01-14: qty 2

## 2020-01-14 MED ORDER — MIDAZOLAM HCL 2 MG/2ML IJ SOLN
INTRAMUSCULAR | Status: AC
  Filled 2020-01-14: qty 2

## 2020-01-14 MED ORDER — FENTANYL CITRATE (PF) 100 MCG/2ML IJ SOLN
INTRAMUSCULAR | Status: AC
  Filled 2020-01-14: qty 2

## 2020-01-14 MED ORDER — EPHEDRINE SULFATE-NACL 50-0.9 MG/10ML-% IV SOSY
PREFILLED_SYRINGE | INTRAVENOUS | Status: DC | PRN
  Administered 2020-01-14: 10 mg via INTRAVENOUS

## 2020-01-14 MED ORDER — SODIUM CHLORIDE (PF) 0.9 % IJ SOLN
INTRAMUSCULAR | Status: DC | PRN
  Administered 2020-01-14: 2 mL

## 2020-01-14 MED ORDER — BUPIVACAINE-EPINEPHRINE (PF) 0.5% -1:200000 IJ SOLN
INTRAMUSCULAR | Status: AC
  Filled 2020-01-14: qty 30

## 2020-01-14 MED ORDER — DEXAMETHASONE SODIUM PHOSPHATE 10 MG/ML IJ SOLN
INTRAMUSCULAR | Status: DC | PRN
  Administered 2020-01-14: 6 mg via INTRAVENOUS

## 2020-01-14 MED ORDER — LACTATED RINGERS IV SOLN: INTRAVENOUS | Status: DC

## 2020-01-14 MED ORDER — LIDOCAINE 2% (20 MG/ML) 5 ML SYRINGE
INTRAMUSCULAR | Status: DC | PRN
  Administered 2020-01-14: 100 mg via INTRAVENOUS

## 2020-01-14 SURGICAL SUPPLY — 37 items
BENZOIN TINCTURE PRP APPL 2/3 (GAUZE/BANDAGES/DRESSINGS) IMPLANT
BINDER ABDOMINAL 12 ML 46-62 (SOFTGOODS) ×3 IMPLANT
BLADE SURG 15 STRL LF DISP TIS (BLADE) ×1 IMPLANT
BLADE SURG 15 STRL SS (BLADE) ×2
BRIEF STRETCH FOR OB PAD LRG (UNDERPADS AND DIAPERS) ×3 IMPLANT
CNTNR URN SCR LID CUP LEK RST (MISCELLANEOUS) IMPLANT
CONT SPEC 4OZ STRL OR WHT (MISCELLANEOUS)
COVER SURGICAL LIGHT HANDLE (MISCELLANEOUS) ×3 IMPLANT
COVER WAND RF STERILE (DRAPES) IMPLANT
DRAPE LAPAROTOMY T 102X78X121 (DRAPES) ×3 IMPLANT
DRSG PAD ABDOMINAL 8X10 ST (GAUZE/BANDAGES/DRESSINGS) IMPLANT
ELECT REM PT RETURN 15FT ADLT (MISCELLANEOUS) ×3 IMPLANT
GAUZE 4X4 16PLY RFD (DISPOSABLE) ×3 IMPLANT
GAUZE SPONGE 4X4 12PLY STRL (GAUZE/BANDAGES/DRESSINGS) ×3 IMPLANT
GLOVE BIO SURGEON STRL SZ7.5 (GLOVE) ×3 IMPLANT
GLOVE INDICATOR 8.0 STRL GRN (GLOVE) ×3 IMPLANT
GOWN STRL REUS W/TWL XL LVL3 (GOWN DISPOSABLE) ×6 IMPLANT
KIT BASIN (CUSTOM PROCEDURE TRAY) ×3 IMPLANT
KIT TURNOVER KIT A (KITS) IMPLANT
LEGGING LITHOTOMY PAIR STRL (DRAPES) ×3 IMPLANT
LOOP VESSEL MAXI BLUE (MISCELLANEOUS) ×3 IMPLANT
NEEDLE HYPO 22GX1.5 SAFETY (NEEDLE) ×3 IMPLANT
PACK BASIC VI WITH GOWN DISP (CUSTOM PROCEDURE TRAY) ×3 IMPLANT
PENCIL SMOKE EVACUATOR (MISCELLANEOUS) ×3 IMPLANT
SHEARS HARMONIC 9CM CVD (BLADE) IMPLANT
SURGILUBE 2OZ TUBE FLIPTOP (MISCELLANEOUS) ×3 IMPLANT
SUT CHROMIC 2 0 SH (SUTURE) ×3 IMPLANT
SUT CHROMIC 3 0 SH 27 (SUTURE) IMPLANT
SUT VIC AB 2-0 SH 27 (SUTURE)
SUT VIC AB 2-0 SH 27X BRD (SUTURE) IMPLANT
SUT VIC AB 2-0 UR6 27 (SUTURE) IMPLANT
SYR 20ML LL LF (SYRINGE) ×3 IMPLANT
SYR 27GX1/2 1ML LL SAFETY (SYRINGE) ×3 IMPLANT
SYR 3ML LL SCALE MARK (SYRINGE) ×3 IMPLANT
TOWEL OR 17X26 10 PK STRL BLUE (TOWEL DISPOSABLE) ×3 IMPLANT
TOWEL OR NON WOVEN STRL DISP B (DISPOSABLE) ×3 IMPLANT
YANKAUER SUCT BULB TIP 10FT TU (MISCELLANEOUS) ×3 IMPLANT

## 2020-01-14 NOTE — Transfer of Care (Addendum)
Immediate Anesthesia Transfer of Care Note  Patient: Steven [name]  Procedure(s) Performed: ANORECTAL EXAM UNDER ANESTHESIA (N/A ) INJECTION OF BOTOX INTO ANAL SPHINCTER (N/A )  Patient Location: PACU  Anesthesia Type:General  Level of Consciousness: awake, drowsy,cooperative.  Airway & Oxygen Therapy: Patient Spontanous Breathing and Patient connected to face mask oxygen  Post-op Assessment: Report given to RN and Post -op Vital signs reviewed and stable  Post vital signs: Reviewed and stable  Last Vitals:  Vitals Value Taken Time  BP 118/66 01/14/20 0811  Temp    Pulse 55 01/14/20 0813  Resp 12 01/14/20 0813  SpO2 100 % 01/14/20 0813  Vitals shown include unvalidated device data.  Last Pain:  Vitals:   01/14/20 0617  TempSrc:   PainSc: 0-No pain         Complications: No complications documented.

## 2020-01-14 NOTE — Op Note (Signed)
01/14/2020  8:24 AM  PATIENT:  Steven [name]  69 y.o. male  Patient Care Team: Scherrie Bateman as PCP - General (Family Medicine) Josue Hector, MD as PCP - Cardiology (Cardiology) Gala Romney Cristopher Estimable, MD as Attending Physician (Gastroenterology)  PRE-OPERATIVE DIAGNOSIS:  Anal fissure  POST-OPERATIVE DIAGNOSIS:  Same  PROCEDURE:   1. Injection of perianal botox 2. Anorectal exam under anesthesia  SURGEON:  Surgeon(s): Ileana Roup, MD   ANESTHESIA:   local and general  SPECIMEN:  No Specimen  DISPOSITION OF SPECIMEN:  N/A  COUNTS:  Sponge, needle, and instrument counts were reported correct x2 at conclusion.  EBL: 1 mL  Drains: None  PLAN OF CARE: Discharge to home after PACU  PATIENT DISPOSITION:  PACU - hemodynamically stable.  INDICATION: Mr. Shapley is a pleasant 69yoM history of prostate cancer who underwent brachytherapy and XRT remotely he was referred to our office from Dr. Gala Romney, for evaluation of anal pain.  He has been seen multiple times in our office over the last couple of months.  He has tried numerous different therapies including diltiazem.  He was found to have an anal fissure in both the posterior midline and anterior midline.  He had undergone colonoscopy which demonstrated no evidence of masses or concerning findings within the rectum.  He does have a history of radiation proctopathy that was managed with APC in 2014 following his radiation.  He did experience gradual improvement with diltiazem but not with complete resolution.  He had ongoing issues with anal spasms and pain.  We had obtained a CT scan which demonstrated no evidence of abscess or other concerning features.  He had no palpable masses on exam.  He did have a focus of punctate granulation tissue near the posterior midline.  We discussed options going forward and he opted to pursue surgery.  Please refer to notes elsewhere for details regarding this discussion.  OR FINDINGS:  Almost completely healed posterior midline anal fissure.  No evident anterior anal fissure at this time.  No other concerning finding on circumferential anoscopy.  There was a punctate area of some granulation tissue that was probed and interrogated multiple times.  There is no evident fistula or communication to the anal canal.  There is no palpable tract between this and the anal canal. Ultimately, 100 U of botox diluted to 2 cc with saline was injected circumferentially into the internal sphincter muscle.  DESCRIPTION: The patient was identified in the preoperative holding area and taken to the OR where he was placed on the operating room table. SCDs were placed.  General anesthesia was induced without difficulty. The patient was then positioned in lithotomy position with Allen stirrups. Pressure points were then evaluated and padded. He was then prepped and draped in usual sterile fashion.  A surgical timeout was performed indicating the correct patient, procedure, positioning and need for preoperative antibiotics.  A rectal block was performed using Marcaine with epinephrine and Exparel.    A well lubricated digital rectal exam was performed which demonstrated no palpable abnormalities.  A Hill-Ferguson anoscope was into the anal canal and circumferential inspection demonstrated almost healed posterior midline anal fissure, no evident anterior anal fissure, small skin tags.  Tiny area of nodule/tissue in posterior midline was cannulated and probed carefully multiple times and did not tract anywhere nor communicate with anal canal with saline instillation. There was also no palpable tract. No other abnormalities were seen. Botox was then injected - 100U diluted in  2 cc of saline into the internal sphincter muscle circumferentially. Hemostasis was achieved. Dressing consisting of 4x4s, ABD was placed. He was then taken out of lithotomy, awakened from anesthesia and transferred to a stretcher for transport to  PACU in satisfactory condition.  DISPOSITION:PACU in satisfactory condition.

## 2020-01-14 NOTE — Discharge Instructions (Signed)
ANORECTAL SURGERY: POST OP INSTRUCTIONS  1. DIET: Follow a light bland diet the first 24 hours after arrival home, such as soup, liquids, crackers, etc.  Be sure to include lots of fluids daily.  Avoid fast food or heavy meals as your are more likely to get nauseated.  Eat a low fat diet the next few days after surgery.    2. Take your usually prescribed home medications unless otherwise directed.  3. PAIN CONTROL: a. It is helpful to take an over-the-counter pain medication regularly for the first few days/weeks.  Choose from the following that works best for you: i. Ibuprofen (Advil, etc) Three 228m tabs every 6 hours as needed. ii. Acetaminophen (Tylenol, etc) 500-6585mevery 6 hours as needed iii. NOTE: You may take both of these medications together - most patients find it most helpful when alternating between the two (i.e. Ibuprofen at 6am, tylenol at 9am, ibuprofen at 12pm ...) b. A  prescription for pain medication may have been prescribed for you at discharge.  Take your pain medication as prescribed.  i. If you are having problems/concerns with the prescription medicine, please call us[place]or further advice.  4. Avoid getting constipated.  Between the surgery and the pain medications, it is common to experience some constipation.  Increasing fluid intake (64oz of water per day) and taking a fiber supplement (such as Metamucil, Citrucel, FiberCon) 1-2 times a day regularly will usually help prevent this problem from occurring.  Take Miralax (over the counter) 1-2x/day while taking a narcotic pain medication. If no bowel movement after 48hours, you may additionally take a laxative like a bottle of Milk of Magnesia which can be purchased over the counter. Avoid enemas if possible as these are often painful.   5. Watch out for diarrhea.  If you have many loose bowel movements, simplify your diet to bland foods.  Stop any stool softeners and decrease your fiber supplement. If this worsens or does  not improve, please call us[place] 6. Wash / shower every day.  If you were discharged with a dressing, you may remove this the day after your surgery. You may shower normally, getting soap/water on your wound, particularly after bowel movements.  7. Soaking in a warm bath filled a couple inches ("Sitz bath") is a great way to clean the area after a bowel movement and many patients find it is a way to soothe the area.  8. ACTIVITIES as tolerated:   a. You may resume regular (light) daily activities beginning the next day--such as daily self-care, walking, climbing stairs--gradually increasing activities as tolerated.  If you can walk 30 minutes without difficulty, it is safe to try more intense activity such as jogging, treadmill, bicycling, low-impact aerobics, etc. b. Refrain from any heavy lifting or straining for the first 2 weeks after your procedure, particularly if your surgery was for hemorrhoids. c. Avoid activities that make your pain worse d. You may drive when you are no longer taking prescription pain medication, you can comfortably wear a seatbelt, and you can safely maneuver your car and apply brakes.  9. FOLLOW UP in our office a. Please call [facility] at [phone] to set up an appointment to see your surgeon in the office for a follow-up appointment approximately 2 weeks after your surgery. b. Make sure that you call for this appointment the day you arrive home to insure a convenient appointment time.  9. If you have disability or family leave forms that need to be completed,  you may have them completed by your primary care physician's office; for return to work instructions, please ask our office staff and they will be happy to assist you in obtaining this documentation   When to call us [phone]: 1. Poor pain control 2. Reactions / problems with new medications (rash/itching, etc)  3. Fever over 101.5 F (38.5 C) 4. Inability to urinate 5. Nausea/vomiting 6. Worsening  swelling or bruising 7. Continued bleeding from incision. 8. Increased pain, redness, or drainage from the incision  The clinic staff is available to answer your questions during regular business hours (8:30am-5pm).  Please don't hesitate to call and ask to speak to one of our nurses for clinical concerns.   A surgeon from [facility] is always on call at the hospitals   If you have a medical emergency, go to the nearest emergency room or call 911.   [facility], [place], [place], [place], [place]  [address] ? MAIN: [phone] FAX [phone] [email or web address]

## 2020-01-14 NOTE — H&P (Signed)
CC: Anal pain, history of anal fissure  HPI: Steven [name] is a pleasant 70yoM whom has hx of HTN, Afib (on Eliquis), HLD, constipation, prostate ca (s/p brachytherapy XRT remotely) who is referred to our office from gastroenterology, Dr. Roseanne Kaufman PA, for evaluation of anal pain. He reports a personal history of anal fissure in the past. For the last 3-4 weeks, he has had sharp knifelike anal pains particularly when having bowel movements. There is some associated bright red blood per rectum with bowel movements. He has a history of atrial fibrillation and is on anticoagulation for this. He reports he is tried some topical creams as well as instilling foreign bodies in the anal canal to instill Preparation H. He reports that he was recently prescribed a topical compound medication-diltiazem-and had applied this 3-4 times a day for a week or 2. He denies any history of fever/chills. He has multiple complaints today including numbness in his left hand and feet when he has bowel movements. He denies nausea/vomiting or abdominal bloating.  By record review, he does have history of neovascular changes in the anterior rectal mucosa that were reported to be secondary to radiation-induced proctopathy. This was treated into 2014 with APC to the anterior rectal wall.  He underwent colonoscopy with Dr Gala Romney 10/23/19 which demonstrated multiple areas of radiation proctopathy in the distal rectum that were treated with APC. Remainder of the exam was unremarkable aside from having small hemorrhoids. He has been applying the topical nifedipine but recently ran out. Of until a week ago, he is still struggling with significant constipation and difficulty passing stool which was formed and hard. Every time he passes a hard bowel movement here to have anal pain. Starting 7 days ago, he was finally able to increase his MiraLAX to 2 caps of MiraLAX a day and is now having softer easier to pass bowel movements. He'll  still have intermittent knifelike sensations in the anal area when passing stool. He denies any fever/chills or persistent/prolonged perianal pain that doesn't subside after bowel movements.  Today, he reports slow improvement but persistence in symptoms of anal pain. He still has significant pain in the perianal area. He denies fever/chills. He denies any perianal pain persists more than 1 or 2 hours after bowel movements. He denies any blood per rectum  He has been off Eliquis now since Sunday PM (last dose). Denies any other complaints today.  PMH: HTN (well controlled on oral antihypertensive), Afib (maintained on Eliquis), HLD, constipation, prostate ca (s/p brachytherapy 2017)  PSH: He denies any prior anorectal surgery/procedures  FHx: Denies FHx of malignancy  Social: Denies use of tobacco/EtOH/drugs  ROS: A comprehensive 10 system review of systems was completed with the patient and pertinent findings as noted above.  Past Medical History:  Diagnosis Date  . Arthritis   . Atrial fibrillation ([place])   . Atrial flutter ([place])   . BPH (benign prostatic hyperplasia)   . Chest pain   . Dizziness   . Dyspnea    laying down occ  . Fracture 08/17/2015   MULTIPLE RIB FRACTURES     FROM FALL   . GERD (gastroesophageal reflux disease)   . Hemorrhoids   . History of kidney stones    noted on CT scan  . History of radiation therapy 08/22/11-10/13/11   prostate  . Hyperlipemia   . Hypertension   . Hyperthyroidism   . Light headedness   . Numbness and tingling in left arm   . Numbness and tingling  of both legs   . Prostate cancer [facility])    prostate s/p radiation Mar 2013    Past Surgical History:  Procedure Laterality Date  . COLONOSCOPY N/A 12/19/2012   HEN:IDPOEU 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 Normal appearing colon  . COLONOSCOPY WITH PROPOFOL N/A 10/23/2019   Procedure: COLONOSCOPY WITH PROPOFOL;   Surgeon: Steven Dolin, MD; external and grade 2 internal hemorrhoids, abnormal rectal blood vessels consistent with radiation proctitis s/p APC therapy, otherwise normal exam.  . HOT HEMOSTASIS  10/23/2019   Procedure: HOT HEMOSTASIS (ARGON PLASMA COAGULATION/BICAP);  Surgeon: Steven Dolin, MD;  [facility];  Service: Endoscopy;;  apc rectal proctitis    . KIDNEY SURGERY  1982   kidney tube collapse repair  . RADIOACTIVE SEED IMPLANT     Prostate    Family History  Problem Relation Age of Onset  . Aneurysm Mother   . Hypertension Mother   . Prostate cancer Father   . Hypertension Father   . Colon cancer Neg Hx   . Colon polyps Neg Hx     Social:  reports that he quit smoking about 17 months ago. His smoking use included cigarettes. He has a 20.00 pack-year smoking history. He has quit using smokeless tobacco. He reports that he does not drink alcohol and does not use drugs.  Allergies: No Known Allergies  Medications: I have reviewed the patient's current medications.  Results for orders placed or performed during the hospital encounter of 01/14/20 (from the past 48 hour(s))  Protime-INR     Status: None   Collection Time: 01/14/20  6:23 AM  Result Value Ref Range   Prothrombin Time 13.1 11.4 - 15.2 seconds   INR 1.0 0.8 - 1.2    Comment: (NOTE) INR goal varies based on device and disease states. Performed at [facility], [place] [address], [place], [place] [address]     No results found.  ROS - all of the below systems have been reviewed with the patient and positives are indicated with bold text General: chills, fever or night sweats Eyes: blurry vision or double vision ENT: epistaxis or sore throat Allergy/Immunology: itchy/watery eyes or nasal congestion Hematologic/Lymphatic: bleeding problems, blood clots or swollen lymph nodes Endocrine: temperature intolerance or unexpected weight changes Breast: new or changing breast lumps or  nipple discharge Resp: cough, shortness of breath, or wheezing CV: chest pain or dyspnea on exertion GI: as per HPI GU: dysuria, trouble voiding, or hematuria MSK: joint pain or joint stiffness Neuro: TIA or stroke symptoms Derm: pruritus and skin lesion changes Psych: anxiety and depression  PE Blood pressure (!) 145/67, pulse (!) 53, temperature 98 F (36.7 C), temperature source Oral, resp. rate 18, weight 78 kg, SpO2 100 %. Constitutional: NAD; conversant; no deformities Neck: Trachea midline Lungs: Normal respiratory effort CV: RRR GI: Abd soft, NT/ND Psychiatric: Appropriate affect; alert and oriented x3  Results for orders placed or performed during the hospital encounter of 01/14/20 (from the past 48 hour(s))  Protime-INR     Status: None   Collection Time: 01/14/20  6:23 AM  Result Value Ref Range   Prothrombin Time 13.1 11.4 - 15.2 seconds   INR 1.0 0.8 - 1.2    Comment: (NOTE) INR goal varies based on device and disease states. Performed at [facility], [place] [address] [place], [place] [address]     No results found.  A/P: Mr.  Gowan is a pleasant [phone] with hx of anal fissure, HTN, Afib (on Eliquis), HLD, constipation here with intermittent sharp anal pain particularly with defecation and evidence of beginnings of anterior midline anal fissure  Cscope 10/23/19 showed radiation proctopathy, treated with APC  -Given his symptoms although somewhat improved but persistent, I recommended he proceed with anorectal exam under anesthesia. Initially, he did not want to pursue any sort of surgical intervention including an exam under anesthesia despite [place] discussing our concerns including the fact that this is not behaving like a typical anal fissure and potential for underlying neoplasm, although, his colonoscopy had a good evaluation of the remaining including a view in retroflexion. -He did however agree to proceed with surgery after we discussed that  he is tried conservative management now for 6+ weeks and is still having significant issues and pain. He is scheduled for a CT abdomen next week at [facility] - we will plan to add a CT pelvis to be done with this. -The anatomy and physiology of the anal canal was discussed at length with the patient. The pathophysiology of anal fissure, anal fistula was discussed at length with associated pictures and illustrations. -We discussed anorectal exam under anesthesia, injection of Botox, possible fistulotomy, possible seton (based on intraop findings) -The planned procedure, material risks (including, but not limited to, pain, bleeding, infection, scarring, need for blood transfusion, damage to anal sphincter, incontinence of gas and/or stool, need for additional procedures, recurrence, pneumonia, heart attack, stroke, death) benefits and alternatives to surgery were discussed at length. The patient's questions were answered to their satisfaction, they voiced understanding and elected to proceed with surgery. Additionally, we discussed typical postoperative expectations and the recovery process.  Sharon Mt. Dema Severin, M.D. [facility] Use [email or web address] to contact on call provider

## 2020-01-14 NOTE — Anesthesia Procedure Notes (Signed)
Procedure Name: LMA Insertion Date/Time: 01/14/2020 7:25 AM Performed by: West Pugh, CRNA Pre-anesthesia Checklist: Patient identified, Emergency Drugs available, Suction available, Patient being monitored and Timeout performed Patient Re-evaluated:Patient Re-evaluated prior to induction Oxygen Delivery Method: Circle system utilized Preoxygenation: Pre-oxygenation with 100% oxygen Induction Type: IV induction LMA: LMA with gastric port inserted LMA Size: 4.0 Number of attempts: 1 Placement Confirmation: positive ETCO2 and breath sounds checked- equal and bilateral Tube secured with: Tape Dental Injury: Teeth and Oropharynx as per pre-operative assessment

## 2020-01-15 ENCOUNTER — Encounter (HOSPITAL_COMMUNITY): Payer: Self-pay | Admitting: Surgery

## 2020-01-15 ENCOUNTER — Telehealth: Payer: Self-pay

## 2020-01-15 NOTE — Telephone Encounter (Signed)
FYI, Spoke with pt. Pt cancelled apt for tomorrow due to having procedure with [facility]. Pt was r/s to 03/15/20.  Please add pt to cancellation list.

## 2020-01-15 NOTE — Telephone Encounter (Signed)
Noted  

## 2020-01-15 NOTE — Anesthesia Postprocedure Evaluation (Signed)
Anesthesia Post Note  Patient: Steven [name]  Procedure(s) Performed: ANORECTAL EXAM UNDER ANESTHESIA (N/A ) INJECTION OF BOTOX INTO ANAL SPHINCTER (N/A )     Patient location during evaluation: PACU Anesthesia Type: General Level of consciousness: awake and alert and oriented Pain management: pain level controlled Vital Signs Assessment: post-procedure vital signs reviewed and stable Respiratory status: spontaneous breathing, nonlabored ventilation and respiratory function stable Cardiovascular status: blood pressure returned to baseline Postop Assessment: no apparent nausea or vomiting Anesthetic complications: no   No complications documented.  Last Vitals:  Vitals:   01/14/20 0845 01/14/20 0900  BP: 128/67 127/74  Pulse: (!) 58 (!) 54  Resp: 18 13  Temp:    SpO2: 100% 100%    Last Pain:  Vitals:   01/14/20 0900  TempSrc:   PainSc: 0-No pain   Pain Goal:                   Brennan Bailey

## 2020-01-16 ENCOUNTER — Ambulatory Visit: Payer: BC Managed Care – PPO | Admitting: Gastroenterology

## 2020-01-16 ENCOUNTER — Other Ambulatory Visit: Payer: Self-pay | Admitting: Gastroenterology

## 2020-01-16 ENCOUNTER — Telehealth: Payer: Self-pay | Admitting: Emergency Medicine

## 2020-01-16 NOTE — Telephone Encounter (Signed)
Called pt [phone] . Number is not in service

## 2020-01-16 NOTE — Telephone Encounter (Signed)
Called number back [phone] and number was working, I was able to lmom

## 2020-01-16 NOTE — Telephone Encounter (Signed)
Pt called and verified name and dob stated he is having pain in his lower left abd area, below his rib cage it has been going on for about 1 month. Denies any other problems.

## 2020-01-16 NOTE — Telephone Encounter (Signed)
Will need some additional information before I can offer any recommendations.   When did LLQ pain start? Is it constant or intermittent? Severity? Is he having BMs daily? N/V/fever/chills? Any urinary symptoms?

## 2020-01-19 NOTE — Telephone Encounter (Signed)
Spoke with patient. Mild LLQ abdominal pain associated with certain movements/increasing intraabdominal pressure. Some days he has no pain. No association with meals or Bms. Has been present for about 1 month. Doesn't remember every straining himself, lifting anything heavy, or falling. No N/V or fever. He is already taking tylenol and tramadol s/p botox on 6/23. Notes burning with urinating requesting his urine to be checked. When asking if he has pain when pressing on his abdomen, he states he can't seem to locate the pain at this time and is not having any pain with palpation.   Symptoms are most consistent with MSK etiology. Recommend he take it [place] for the next several weeks, avoid heavy lifting/straining, continue current medications for pain as needed. We can go ahead and recheck his urine to ensure no UTI. Doubt this as prior evaluation was negative. Advised he continue to monitor for worsening pain and let us know if this occurs.   Magda Paganini, please arrange for UA and Urine culture. Dx: Dysuria.

## 2020-01-19 NOTE — Telephone Encounter (Signed)
Called to discuss symptoms with patient. No answer. [facility] requesting return call.   Brett Fairy

## 2020-01-19 NOTE — Telephone Encounter (Addendum)
Pt called back and is aware that [facility] will call him back at 5:00.  He voiced understanding.

## 2020-01-19 NOTE — Telephone Encounter (Signed)
Pain started 1 month ago. The pain is  intermittent and on a scale from 0-10 the pain is a 3. Pt states he is having about 2 bm's a day. Pt denies n/v/fever/ chills and that the pain is only on his left side. Pt states he is voiding a lot but that he has increased his water and he is taking a fluid pill.

## 2020-01-20 ENCOUNTER — Other Ambulatory Visit: Payer: Self-pay | Admitting: Emergency Medicine

## 2020-01-20 DIAGNOSIS — R300 Dysuria: Secondary | ICD-10-CM

## 2020-01-20 NOTE — Telephone Encounter (Signed)
Orders placed for ua and urine culture for dysuria. Sent to [facility]. Pt notified

## 2020-01-20 NOTE — Telephone Encounter (Signed)
Noted  

## 2020-01-21 ENCOUNTER — Telehealth: Payer: Self-pay | Admitting: Internal Medicine

## 2020-01-21 NOTE — Telephone Encounter (Signed)
Lmom, waiting on a return call.  

## 2020-01-21 NOTE — Telephone Encounter (Signed)
Pt called from Urgent Care. Pt says he would have to call our office back. Pt is having rt hand/nerve pain.

## 2020-01-21 NOTE — Telephone Encounter (Signed)
[phone] please call patient, his side is more sore and he was not able to go to the lab yesterday due to the pain, he said that he may go to urgent care later on today   I asked him if he was going to also go to the lab and he said he would try

## 2020-01-22 ENCOUNTER — Other Ambulatory Visit: Payer: Self-pay | Admitting: "Endocrinology

## 2020-01-23 LAB — URINALYSIS
Bilirubin Urine: NEGATIVE
Glucose, UA: NEGATIVE
Hgb urine dipstick: NEGATIVE
Ketones, ur: NEGATIVE
Leukocytes,Ua: NEGATIVE
Nitrite: NEGATIVE
Protein, ur: NEGATIVE
Specific Gravity, Urine: 1.008 (ref 1.001–1.03)
pH: 6.5 (ref 5.0–8.0)

## 2020-01-23 LAB — URINE CULTURE
MICRO NUMBER:: 10658913
Result:: NO GROWTH
SPECIMEN QUALITY:: ADEQUATE

## 2020-01-27 ENCOUNTER — Telehealth: Payer: Self-pay | Admitting: Cardiovascular Disease

## 2020-01-27 ENCOUNTER — Telehealth: Payer: Self-pay | Admitting: Urology

## 2020-01-27 NOTE — Telephone Encounter (Signed)
Patient called and would like for a nurse to call him back. [phone]

## 2020-01-27 NOTE — Telephone Encounter (Signed)
Pt left a VM stating he wanted a nurse to call him back

## 2020-01-27 NOTE — Telephone Encounter (Signed)
Returned call to pt and pt complained of his side hurting from a shot he received from another office. Pt stated that he is voiding fine and is not having any urinary issues. Pt informed to call the office that gave him the shot he is concerned about and to call our office with any of his urinary and/or prostate issues. Pt voiced understating.

## 2020-01-28 ENCOUNTER — Telehealth: Payer: Self-pay | Admitting: Cardiovascular Disease

## 2020-01-28 ENCOUNTER — Telehealth: Payer: Self-pay

## 2020-01-28 NOTE — Telephone Encounter (Signed)
Pt returned call. Pt is aware of [place] recommendations. Pt is requesting a call from [facility] to discuss changing his doctor from [place]. Pt states Dr. Dema Severin is asking pt to see his PCP due to the severe pain from the Botox. Pt said there is nothing the PCP can help with. Pt said he was told to take benefiber, Miralax and go to the ED. Pt said it's been two week since he had his Botox treatment.

## 2020-01-28 NOTE — Telephone Encounter (Signed)
Pt called and would like a topical cream for his rectum. Pt states he is hurting for 30 mins after he has his BM. Pt states he isn't able to get any cr from [facility]. Pt said he was given a topical for spasm but it isn't helping. Pt is asking for Nifedipine ointment. Pt states this is the only cream that helps ease the pain.

## 2020-01-28 NOTE — Telephone Encounter (Signed)
Recommend cream come from [facility] as they have recently completed botox injection. I do not want to possibly prescribe something that they have recommended against.

## 2020-01-28 NOTE — Telephone Encounter (Signed)
Pt states a Nurse returned his call on yesterday, he missed the call, but doesn't know who the Nurse was.   PLease call again [phone]   thanks renee

## 2020-01-28 NOTE — Telephone Encounter (Signed)
Returned call to pt. Pt reports that he has pain with having a BM. States that the pain last for 30 min after the BM. He is requesting NTG for his rectum. Informed pt that he would have to call PCP or GI for medication for his rectum.

## 2020-01-28 NOTE — Telephone Encounter (Signed)
Tried to call patient to discuss further. Went to [employer]. Will try again tomorrow.

## 2020-01-28 NOTE — Telephone Encounter (Signed)
Lmom, waiting on a return call.  

## 2020-01-29 ENCOUNTER — Telehealth: Payer: Self-pay | Admitting: Internal Medicine

## 2020-01-29 NOTE — Telephone Encounter (Signed)
Tried to call Pt x 2 and no answer. Will try again tomorrow when I return to the office. Did not leave voicemail as I was calling from personal phone due to being out of the office.   [place], [place]

## 2020-01-29 NOTE — Telephone Encounter (Signed)
See separate telephone note.

## 2020-01-29 NOTE — Telephone Encounter (Signed)
Patient returned call. Please call patient back

## 2020-01-29 NOTE — Telephone Encounter (Signed)
Tried calling patient again today. Went to [employer]. Left a message requesting return call. Let him know I would be leaving the office soon but would return tomorrow at 8 am.

## 2020-01-29 NOTE — Telephone Encounter (Signed)
Patient called back. He was upset that Cyril Mourning was not in when he called back. He reports he is not getting any answers from anyone. He doesn't understand why we can't get him when we call him because he gets everyone else's calls.  He is not happy with Dr. Dema Severin. He reports he does not answer his questions and sends him to his PCP. He is seeing PCP today for an appointment but doesn't understand what PCP is going to do. He states this is "ridiculous" and states "i'm just frustrated". Reports rectum is so sore and after BM he has to go straight to bed after a BM to recuperate.   Message sent to [facility]

## 2020-01-30 ENCOUNTER — Other Ambulatory Visit: Payer: Self-pay | Admitting: Gastroenterology

## 2020-01-30 NOTE — Progress Notes (Signed)
Called in 0.2% nitroglycerine cream with instructions to apply up to 4 times daily as needed to [employer].

## 2020-01-30 NOTE — Telephone Encounter (Signed)
Spoke with patient.  States he has not been able to talk to Dr. Dema Severin directly.  He has received recommendations from nurses.  They continue to tell him to use MiraLAX and Benefiber and sitz bath's.  Dr. Dema Severin had prescribed diltiazem right upper surgery which provided no relief of rectal pain.  He is currently reporting 15-20 minutes of rectal spasms after bowel movements and rectal aching if standing very long.  Currently not using any sort of topical medications to help with symptoms other than lidocaine which is not providing much relief.  He states several times that he is not satisfied with his care with Dr. Dema Severin and would like a referral.  After discussing referral to [facility] health and making patient aware that if he were to establish with [facility], Dr. Dema Severin would likely no longer see him, patient then states he was told that Botox could take up to 3 or 4 weeks to provide improvement and would like to hold off on a referral at this time.  For symptomatic relief, I will prescribe nitroglycerin 0.2% up to 4 times daily.  Otherwise, I have advised that he contact Dr. Orest Dikes office directly to discuss further.  If he would like a referral to [place], he will let us know.

## 2020-01-31 ENCOUNTER — Ambulatory Visit
Admission: EM | Admit: 2020-01-31 | Discharge: 2020-01-31 | Disposition: A | Discharge status: Home / Self Care | Payer: BC Managed Care – PPO | Source: Home / Self Care

## 2020-01-31 ENCOUNTER — Encounter (HOSPITAL_COMMUNITY): Payer: Self-pay

## 2020-01-31 ENCOUNTER — Emergency Department (HOSPITAL_COMMUNITY)
Admission: EM | Admit: 2020-01-31 | Discharge: 2020-01-31 | Disposition: A | Discharge status: Home / Self Care | Payer: BC Managed Care – PPO | Attending: Emergency Medicine | Admitting: Emergency Medicine

## 2020-01-31 ENCOUNTER — Other Ambulatory Visit: Payer: Self-pay

## 2020-01-31 DIAGNOSIS — I10 Essential (primary) hypertension: Secondary | ICD-10-CM | POA: Insufficient documentation

## 2020-01-31 DIAGNOSIS — Z8546 Personal history of malignant neoplasm of prostate: Secondary | ICD-10-CM | POA: Diagnosis not present

## 2020-01-31 DIAGNOSIS — Z87891 Personal history of nicotine dependence: Secondary | ICD-10-CM | POA: Insufficient documentation

## 2020-01-31 DIAGNOSIS — G8929 Other chronic pain: Secondary | ICD-10-CM

## 2020-01-31 DIAGNOSIS — M791 Myalgia, unspecified site: Secondary | ICD-10-CM | POA: Diagnosis present

## 2020-01-31 NOTE — ED Notes (Signed)
Pt states he is to see surgeon on Tuesday. Pt with tenderness to left lower abd that comes and goes.

## 2020-01-31 NOTE — ED Triage Notes (Signed)
Advised to go to the ER for further workup.

## 2020-01-31 NOTE — ED Notes (Signed)
Pt more concerned with nerve pain to left hand with BM's

## 2020-01-31 NOTE — ED Provider Notes (Signed)
[facility] Emergency Department Provider Note MRN:  [id number]  Arrival date & time: 01/31/20     Chief Complaint   Generalized Body Pain   History of Present Illness   Steven [name] is a 69 y.o. year-old male with a history of A. fib, hypertension presenting to the ED with chief complaint of body pain.  Patient has multiple complaints today.  Has been struggling with rectal pain due to an anal fissure for several months.  Received Botox injection a few weeks ago, does not seem to be helping.  Patient also has a "nerve pain" in his left hand when he has bowel movements.  He endorses occasional lower abdominal pain but not currently.  Denies fever, no vomiting, no chest pain, no shortness of breath, no dysuria, no neck or back pain, no numbness or weakness to the arms or legs.  Symptoms are mild to moderate, intermittent.  Rectal pain worse with bowel movements.  Review of Systems  A complete 10 system review of systems was obtained and all systems are negative except as noted in the HPI and PMH.   Patient's Health History    Past Medical History:  Diagnosis Date  . Arthritis   . Atrial fibrillation ([place])   . Atrial flutter ([place])   . BPH (benign prostatic hyperplasia)   . Chest pain   . Dizziness   . Dyspnea    laying down occ  . Fracture 08/17/2015   MULTIPLE RIB FRACTURES     FROM FALL   . GERD (gastroesophageal reflux disease)   . Hemorrhoids   . History of kidney stones    noted on CT scan  . History of radiation therapy 08/22/11-10/13/11   prostate  . Hyperlipemia   . Hypertension   . Hyperthyroidism   . Light headedness   . Numbness and tingling in left arm   . Numbness and tingling of both legs   . Prostate cancer [facility])    prostate s/p radiation Mar 2013    Past Surgical History:  Procedure Laterality Date  . BOTOX INJECTION N/A 01/14/2020   Procedure: INJECTION OF BOTOX INTO ANAL SPHINCTER;  Surgeon: Ileana Roup, MD;  Location:  [facility] ORS;  Service: General;  Laterality: N/A;  . 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 Normal appearing colon  . COLONOSCOPY WITH PROPOFOL N/A 10/23/2019   Procedure: COLONOSCOPY WITH PROPOFOL;  Surgeon: Daneil Dolin, MD; external and grade 2 internal hemorrhoids, abnormal rectal blood vessels consistent with radiation proctitis s/p APC therapy, otherwise normal exam.  . EVALUATION UNDER ANESTHESIA WITH ANAL FISTULECTOMY N/A 01/14/2020   Procedure: ANORECTAL EXAM UNDER ANESTHESIA;  Surgeon: Ileana Roup, MD;  Location: [facility] ORS;  Service: General;  Laterality: N/A;  . HOT HEMOSTASIS  10/23/2019   Procedure: HOT HEMOSTASIS (ARGON PLASMA COAGULATION/BICAP);  Surgeon: Daneil Dolin, MD;  Location: [facility];  Service: Endoscopy;;  apc rectal proctitis    . KIDNEY SURGERY  1982   kidney tube collapse repair  . RADIOACTIVE SEED IMPLANT     Prostate    Family History  Problem Relation Age of Onset  . Aneurysm Mother   . Hypertension Mother   . Prostate cancer Father   . Hypertension Father   . Colon cancer Neg Hx   . Colon polyps Neg Hx     Social History   Socioeconomic History  . Marital status: Single  Spouse name: Not on file  . Number of children: Not on file  . Years of education: Not on file  . Highest education level: Not on file  Occupational History  . Occupation: Custodian     Employer: [employer]  Tobacco Use  . Smoking status: Former Smoker    Packs/day: 1.00    Years: 20.00    Pack years: 20.00    Types: Cigarettes    Quit date: 2020    Years since quitting: 1.5  . Smokeless tobacco: Former Systems developer  . Tobacco comment: Quit smoking x 2 years    07/2015   SOMETIIMES i USE VAPOR   Vaping Use  . Vaping Use: Never used  Substance and Sexual Activity  . Alcohol use: Never  . Drug use: Never  . Sexual activity: Not Currently  Other Topics  Concern  . Not on file  Social History Narrative   Divorced   Social Determinants of Health   Financial Resource Strain:   . Difficulty of Paying Living Expenses:   Food Insecurity:   . Worried About Charity fundraiser in the Last Year:   . Arboriculturist in the Last Year:   Transportation Needs:   . Film/video editor (Medical):   Marland Kitchen Lack of Transportation (Non-Medical):   Physical Activity:   . Days of Exercise per Week:   . Minutes of Exercise per Session:   Stress:   . Feeling of Stress :   Social Connections:   . Frequency of Communication with Friends and Family:   . Frequency of Social Gatherings with Friends and Family:   . Attends Religious Services:   . Active Member of Clubs or Organizations:   . Attends Archivist Meetings:   Marland Kitchen Marital Status:   Intimate Partner Violence:   . Fear of Current or Ex-Partner:   . Emotionally Abused:   Marland Kitchen Physically Abused:   . Sexually Abused:      Physical Exam   Vitals:   01/31/20 1625 01/31/20 1630  BP: 139/70 139/70  Pulse: 63 63  Resp: 16 16  Temp: 97.9 F (36.6 C) 98 F (36.7 C)  SpO2: 100% 100%    CONSTITUTIONAL: Well-appearing, NAD NEURO:  Alert and oriented x 3, no focal deficits EYES:  eyes equal and reactive ENT/NECK:  no LAD, no JVD CARDIO: Regular rate, well-perfused, normal S1 and S2 PULM:  CTAB no wheezing or rhonchi GI/GU:  normal bowel sounds, non-distended, non-tender MSK/SPINE:  No gross deformities, no edema SKIN:  no rash, atraumatic PSYCH:  Appropriate speech and behavior  *Additional and/or pertinent findings included in MDM below  Diagnostic and Interventional Summary    EKG Interpretation  Date/Time:    Ventricular Rate:    PR Interval:    QRS Duration:   QT Interval:    QTC Calculation:   R Axis:     Text Interpretation:        Labs Reviewed - No data to display  No orders to display    Medications - No data to display   Procedures  /  Critical  Care Procedures  ED Course and Medical Decision Making  I have reviewed the triage vital signs, the nursing notes, and pertinent available records from the EMR.  Listed above are laboratory and imaging tests that I personally ordered, reviewed, and interpreted and then considered in my medical decision making (see below for details).      Patient largely has no  acute issues today, he has normal vital signs, a completely benign abdomen with no tenderness, currently without pain.  He has a number of chronic issues that needs primary care follow-up.  Highly doubt emergent condition today, appropriate for discharge.    Barth Kirks. Sedonia Small, [place] [email or web address]  Final Clinical Impressions(s) / ED Diagnoses     ICD-10-CM   1. Other chronic pain  G89.29     ED Discharge Orders    None       Discharge Instructions Discussed with and Provided to Patient:     Discharge Instructions     You were evaluated in the Emergency Department and after careful evaluation, we did not find any emergent condition requiring admission or further testing in the hospital.  Your exam/testing today was overall reassuring.  Please return to the Emergency Department if you experience any worsening of your condition.  We encourage you to follow up with your primary care provider.  Thank you for allowing [place] to be a part of your care.       Maudie Flakes, MD 01/31/20 Tresa Moore

## 2020-01-31 NOTE — ED Triage Notes (Signed)
Pt c/o pain to left arm, left groin left abdomen, and left leg x1 month.   Pt states "I just need some tests run. When I have a bowel movement I get pain in my left hand caused by nerve problems."  Pt states pain worsened on June 23 after Botox surgery for anal fistula.   Pt states Urgent Care sent pt here because they did not have resources.

## 2020-01-31 NOTE — Discharge Instructions (Addendum)
You were evaluated in the Emergency Department and after careful evaluation, we did not find any emergent condition requiring admission or further testing in the hospital.  Your exam/testing today was overall reassuring.  Please return to the Emergency Department if you experience any worsening of your condition.  We encourage you to follow up with your primary care provider.  Thank you for allowing [place] to be a part of your care.

## 2020-02-05 NOTE — Telephone Encounter (Signed)
Spoke with pt. Pt is aware that he doesn't have UC. Pt is having 1-2 bowel movements daily. Some days, pt may go 4-5 times. Pt is taking 2 teaspoons of Benefiber and Miralax bid. Pt wants something mild for what he calls "his IBS". Pt is having gas after eating. Pt says his PCP told him his thyroid is doing better and his medication was reduced to once daily.

## 2020-02-05 NOTE — Telephone Encounter (Signed)
Pt called and wants a call from [facility]. Pt says he is continuing to lose weight, not sleep at night, and pt is having IBS flares. Pt lost 2 more pounds from Saturday 01/31/20 to Monday 02/02/20.  Pt is having constipation more than diarrhea. sometimes loose stool comes behind the formed stool. Pt is taking Benefiber and Miralax. Pt is wondering if he has UC. Pt saw his PCP a few days ago and the blood work was ok other than the calcium level was elevated. Pt will repeat calcium soon. Pt felt flutters last night but didn't feel he was going to pass out. Pt mentioned an ED visit over the weekend due to his lt side pain. Pt called Dr. Orest Dikes office and pt was asked to call our office per pt.

## 2020-02-05 NOTE — Telephone Encounter (Signed)
Spoke with pt. Pt was notified of [place] recommendations.

## 2020-02-05 NOTE — Telephone Encounter (Signed)
He can try adding a daily probiotic to help with the bloating/gas. Align, digestive advantage, or Philips colon health are good options. He can also try beano before meals to prevent gas or simethicone as needed for gas.

## 2020-02-05 NOTE — Telephone Encounter (Signed)
Reviewed ED visit. No labs or imaging ordered. Patient was not having pain when he was actually seen in the ED and his abdominal exam was completely benign. He does not have UC. Recent colonoscopy with normal appearing colon other than changes from prior radiation due to prostate cancer.   Regarding constipation:  Is he having a BM daily?  Is he taking MiraLAX daily or twice daily? How much benefiber is he taking and how often?  Regarding weight loss:  We will have to discuss this further at his next appointment. We have completed CT scans and updated his colonoscopy without any explanation of weight loss. He did have an overactive thyroid for which he is not seeing Dr. Dorris Fetch. His thyroid abnormalities may very well be contributing to his weight loss.   Does he have any nausea or vomiting?

## 2020-02-13 ENCOUNTER — Telehealth: Payer: Self-pay | Admitting: Internal Medicine

## 2020-02-13 NOTE — Telephone Encounter (Signed)
[phone] PATIENT CALLED AND SAID HE IS STILL SORE FROM THE BOTOX INJECTION, DID NOT KNOW IF WE COULD DO ANYTHING ABOUT IT

## 2020-02-16 NOTE — Telephone Encounter (Signed)
Called lmom

## 2020-02-16 NOTE — Telephone Encounter (Signed)
Pt states this has been going on for a month. The pain is when he has bm's or when he stands for a long period of time. Pt states he is having pain in his lower abd area but does not last. Pt states he is having a little diarrhea but it's not everyday. denies blood in his stool, n/v. Pt stated he did have a normal bm today but he did have to strain a little

## 2020-02-16 NOTE — Telephone Encounter (Signed)
The patient should call the provider who did the botox procedure if he is having issues afterward. I believe it was Dr. While Sales promotion account executive) in [place]

## 2020-02-17 DIAGNOSIS — C61 Malignant neoplasm of prostate: Secondary | ICD-10-CM | POA: Insufficient documentation

## 2020-02-17 NOTE — Telephone Encounter (Signed)
Notified pt of provider recommendations. Pt voiced understanding and thanked me for the call

## 2020-02-18 ENCOUNTER — Emergency Department (HOSPITAL_COMMUNITY)
Admission: EM | Admit: 2020-02-18 | Discharge: 2020-02-18 | Discharge status: Against Medical Advice | Payer: BC Managed Care – PPO

## 2020-02-18 ENCOUNTER — Other Ambulatory Visit: Payer: Self-pay

## 2020-02-19 ENCOUNTER — Emergency Department (HOSPITAL_COMMUNITY): Payer: BC Managed Care – PPO

## 2020-02-19 ENCOUNTER — Other Ambulatory Visit: Payer: Self-pay

## 2020-02-19 ENCOUNTER — Emergency Department (HOSPITAL_COMMUNITY)
Admission: EM | Admit: 2020-02-19 | Discharge: 2020-02-19 | Disposition: A | Discharge status: Home / Self Care | Payer: BC Managed Care – PPO | Attending: Emergency Medicine | Admitting: Emergency Medicine

## 2020-02-19 ENCOUNTER — Telehealth: Payer: Self-pay

## 2020-02-19 ENCOUNTER — Encounter (HOSPITAL_COMMUNITY): Payer: Self-pay | Admitting: *Deleted

## 2020-02-19 DIAGNOSIS — R200 Anesthesia of skin: Secondary | ICD-10-CM | POA: Diagnosis present

## 2020-02-19 DIAGNOSIS — M4802 Spinal stenosis, cervical region: Secondary | ICD-10-CM | POA: Diagnosis not present

## 2020-02-19 DIAGNOSIS — I10 Essential (primary) hypertension: Secondary | ICD-10-CM | POA: Diagnosis not present

## 2020-02-19 DIAGNOSIS — Z87891 Personal history of nicotine dependence: Secondary | ICD-10-CM | POA: Diagnosis not present

## 2020-02-19 DIAGNOSIS — Z79899 Other long term (current) drug therapy: Secondary | ICD-10-CM | POA: Insufficient documentation

## 2020-02-19 DIAGNOSIS — R202 Paresthesia of skin: Secondary | ICD-10-CM | POA: Diagnosis not present

## 2020-02-19 DIAGNOSIS — N401 Enlarged prostate with lower urinary tract symptoms: Secondary | ICD-10-CM

## 2020-02-19 DIAGNOSIS — Z8546 Personal history of malignant neoplasm of prostate: Secondary | ICD-10-CM

## 2020-02-19 DIAGNOSIS — Z7901 Long term (current) use of anticoagulants: Secondary | ICD-10-CM | POA: Insufficient documentation

## 2020-02-19 DIAGNOSIS — N138 Other obstructive and reflux uropathy: Secondary | ICD-10-CM

## 2020-02-19 LAB — CBC WITH DIFFERENTIAL/PLATELET
Abs Immature Granulocytes: 0.01 10*3/uL (ref 0.00–0.07)
Basophils Absolute: 0 10*3/uL (ref 0.0–0.1)
Basophils Relative: 1 %
Eosinophils Absolute: 0 10*3/uL (ref 0.0–0.5)
Eosinophils Relative: 1 %
HCT: 40 % (ref 39.0–52.0)
Hemoglobin: 13.6 g/dL (ref 13.0–17.0)
Immature Granulocytes: 0 %
Lymphocytes Relative: 40 %
Lymphs Abs: 1.4 10*3/uL (ref 0.7–4.0)
MCH: 30.7 pg (ref 26.0–34.0)
MCHC: 34 g/dL (ref 30.0–36.0)
MCV: 90.3 fL (ref 80.0–100.0)
Monocytes Absolute: 0.3 10*3/uL (ref 0.1–1.0)
Monocytes Relative: 7 %
Neutro Abs: 1.8 10*3/uL (ref 1.7–7.7)
Neutrophils Relative %: 51 %
Platelets: 250 10*3/uL (ref 150–400)
RBC: 4.43 MIL/uL (ref 4.22–5.81)
RDW: 12.3 % (ref 11.5–15.5)
WBC: 3.5 10*3/uL — ABNORMAL LOW (ref 4.0–10.5)
nRBC: 0 % (ref 0.0–0.2)

## 2020-02-19 LAB — BASIC METABOLIC PANEL
Anion gap: 8 (ref 5–15)
BUN: 8 mg/dL (ref 8–23)
CO2: 28 mmol/L (ref 22–32)
Calcium: 9.9 mg/dL (ref 8.9–10.3)
Chloride: 103 mmol/L (ref 98–111)
Creatinine, Ser: 0.98 mg/dL (ref 0.61–1.24)
GFR calc Af Amer: >60 mL/min (ref >60)
GFR calc non Af Amer: >60 mL/min (ref >60)
Glucose, Bld: 112 mg/dL — ABNORMAL HIGH (ref 70–99)
Potassium: 3.4 mmol/L — ABNORMAL LOW (ref 3.5–5.1)
Sodium: 139 mmol/L (ref 135–145)

## 2020-02-19 MED ORDER — PREDNISONE 50 MG PO TABS: 50.0000 mg | ORAL_TABLET | Freq: Every day | ORAL | 0 refills | Status: AC

## 2020-02-19 NOTE — ED Provider Notes (Signed)
[facility] EMERGENCY DEPARTMENT Provider Note   CSN: [id number] Arrival date & time: 02/19/20  1316     History Chief Complaint  Patient presents with  . Numbness    Steven [name] is a 69 y.o. male with PMHx A fib on Eliquis, HTN, HLD, Hyperthyroidism, Hx of prostate cancer s/p radiation (2013) who presents to the ED today with complaint of intermittent "numbness" in his left hand x 4-5 months. Pt also complains of intermittent numbness/tingling in his toes on the left side x several months as well. He states he has been prescribed gabapentin 300 mg TID however he recently ran out and sometimes only takes it twice a day if that. Pt reports he feels a "shock" sensation in his left hand that radiates up his forearm anytime he has a bowel movement. He reports recently receiving a botox injection in his rectum s/2 anal pain (06/23) and since then he has been having more frequent pains in his left hand. Pt is unsure if it is related to the botox injection he received (even though his symptoms were present beforehand) or if it related to a fall off a table he had in 2017 when he "fractured his left elbow." He reports he went to his PCP Dr. Gerarda Fraction today and was told to come to the ED for an MRI of his neck to make sure he "wasn't having a stroke." Pt denies any confusion, speech changes, unilateral weakness, severe headache, facial droop, or any other associated symptoms. He denies missing any doses of his Eliquis.   Additional information received directly from Dr. Gerarda Fraction - pt presented to the office yesterday sometime as a walk in with symptoms including tingling/numbness in his left hand and neck pain. Was unable to be evaluated yesterday in the clinic however recommended pt come to ED to rule out any possible stroke like symptoms.   The history is provided by the patient and medical records.       Past Medical History:  Diagnosis Date  . Arthritis   . Atrial fibrillation ([place])   . Atrial  flutter ([place])   . BPH (benign prostatic hyperplasia)   . Chest pain   . Dizziness   . Dyspnea    laying down occ  . Fracture 08/17/2015   MULTIPLE RIB FRACTURES     FROM FALL   . GERD (gastroesophageal reflux disease)   . Hemorrhoids   . History of kidney stones    noted on CT scan  . History of radiation therapy 08/22/11-10/13/11   prostate  . Hyperlipemia   . Hypertension   . Hyperthyroidism   . Light headedness   . Numbness and tingling in left arm   . Numbness and tingling of both legs   . Prostate cancer [facility])    prostate s/p radiation Mar 2013    Patient Active Problem List   Diagnosis Date Noted  . Subacute thyroiditis 01/07/2020  . Personal history of malignant neoplasm of prostate 12/12/2019  . Benign prostatic hyperplasia with urinary obstruction 12/12/2019  . Weak urinary stream 12/12/2019  . Hyperthyroidism 11/24/2019  . Weight loss 10/21/2019  . IBS (irritable bowel syndrome) 10/21/2019  . Rectal pain 08/21/2019  . Nausea without vomiting 05/30/2019  . Atrial flutter ([place]) 04/17/2019  . Atrial flutter, paroxysmal ([place]) 03/02/2019  . HTN (hypertension) 03/02/2019  . Elevated troponin 03/01/2019  . Hypokalemia 03/01/2019  . Constipation 08/02/2017  . Hemorrhoids 08/02/2017  . Fall 08/18/2015  . Open fracture of left elbow  08/18/2015  . Rib fractures 08/17/2015  . Radiation proctitis 01/30/2013  . Rectal bleeding 12/10/2012  . Chest pain 05/30/2012  . Elevated lipids 05/30/2012  . Palpitations 05/30/2012  . Dyspnea 05/30/2012  . Obese 05/30/2012  . Prostate cancer ([place]) 08/13/2011    Past Surgical History:  Procedure Laterality Date  . BOTOX INJECTION N/A 01/14/2020   Procedure: INJECTION OF BOTOX INTO ANAL SPHINCTER;  Surgeon: Ileana Roup, MD;  Location: [facility] ORS;  Service: General;  Laterality: N/A;  . COLONOSCOPY N/A 12/19/2012   ALP:FXTKWI 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  Normal appearing colon  . COLONOSCOPY WITH PROPOFOL N/A 10/23/2019   Procedure: COLONOSCOPY WITH PROPOFOL;  Surgeon: Daneil Dolin, MD; external and grade 2 internal hemorrhoids, abnormal rectal blood vessels consistent with radiation proctitis s/p APC therapy, otherwise normal exam.  . EVALUATION UNDER ANESTHESIA WITH ANAL FISTULECTOMY N/A 01/14/2020   Procedure: ANORECTAL EXAM UNDER ANESTHESIA;  Surgeon: Ileana Roup, MD;  Location: [facility] ORS;  Service: General;  Laterality: N/A;  . HOT HEMOSTASIS  10/23/2019   Procedure: HOT HEMOSTASIS (ARGON PLASMA COAGULATION/BICAP);  Surgeon: Daneil Dolin, MD;  Location: [facility];  Service: Endoscopy;;  apc rectal proctitis    . KIDNEY SURGERY  1982   kidney tube collapse repair  . RADIOACTIVE SEED IMPLANT     Prostate       Family History  Problem Relation Age of Onset  . Aneurysm Mother   . Hypertension Mother   . Prostate cancer Father   . Hypertension Father   . Colon cancer Neg Hx   . Colon polyps Neg Hx     Social History   Tobacco Use  . Smoking status: Former Smoker    Packs/day: 1.00    Years: 20.00    Pack years: 20.00    Types: Cigarettes    Quit date: 2020    Years since quitting: 1.5  . Smokeless tobacco: Former Systems developer  . Tobacco comment: Quit smoking x 2 years    07/2015   SOMETIIMES i USE VAPOR   Vaping Use  . Vaping Use: Never used  Substance Use Topics  . Alcohol use: Never  . Drug use: Never    Home Medications Prior to Admission medications   Medication Sig Start Date End Date Taking? Authorizing Provider  amLODipine (NORVASC) 10 MG tablet Take 1 tablet (10 mg total) by mouth daily. 04/27/19   [name], MD  apixaban (ELIQUIS) 5 MG TABS tablet Take 1 tablet (5 mg total) by mouth 2 (two) times daily. 10/27/19   Johnson, Clanford L, MD  Cholecalciferol (VITAMIN D3) 125 MCG (5000 UT) CAPS Take 1 capsule by mouth daily.    [provider]  Coenzyme Q10 (COQ10) 100 MG CAPS Take 100 mg by  mouth daily.    [provider]  Cyanocobalamin (B-12 PO) Take 1 tablet by mouth daily.    [provider]  diazepam (VALIUM) 5 MG tablet Take 5 mg by mouth daily as needed for anxiety.  05/13/19   [provider]  hydrochlorothiazide (HYDRODIURIL) 25 MG tablet Take 25 mg by mouth daily. 07/28/19   [provider]  methimazole (TAPAZOLE) 5 MG tablet TAKE ONE TABLET BY MOUTH ONCE DAILY. 01/22/20   Cassandria Anger, MD  metoprolol tartrate (LOPRESSOR) 25 MG tablet Take 1 tablet (25 mg total) by mouth 2 (two) times daily. 04/18/19   Roxan Hockey, MD  NEXLETOL 180  MG TABS Take 180 mg by mouth daily.  12/03/19   [provider]  pantoprazole (PROTONIX) 40 MG tablet TAKE 1 TABLET BY MOUTH DAILY 30 MINUTES BEFORE BREAKFAST 01/16/20   [name] S, PA-C  potassium chloride SA (KLOR-CON) 20 MEQ tablet Take 20 mEq by mouth 2 (two) times daily. 12/09/19   [provider]  predniSONE (DELTASONE) 50 MG tablet Take 1 tablet (50 mg total) by mouth daily for 5 days. 02/19/20 02/24/20  Eustaquio Maize, PA-C  pregabalin (LYRICA) 75 MG capsule Take 75 mg by mouth 2 (two) times daily.    [provider]  Probiotic Product (PROBIOTIC-10 PO) Take 1 capsule by mouth daily.    [provider]  topiramate (TOPAMAX) 100 MG tablet Take 100 mg by mouth at bedtime.  09/04/19   [provider]  VITAMIN E PO Take 1 tablet by mouth daily.    [provider]  zinc gluconate 50 MG tablet Take 50 mg by mouth daily.    [provider]    Allergies    Patient has no known allergies.  Review of Systems   Review of Systems  Constitutional: Negative for chills and fever.  Eyes: Negative for visual disturbance.  Neurological: Negative for dizziness, speech difficulty, weakness, light-headedness and headaches.       + paresthesias  Psychiatric/Behavioral: Negative for confusion.  All other systems reviewed and are  negative.   Physical Exam Updated Vital Signs BP (!) 131/74 (BP Location: Right Arm)   Pulse 55   Temp 98.3 F (36.8 C) (Oral)   Resp 18   Ht 5\' 6"  (1.676 m)   Wt 76 kg   SpO2 100%   BMI 27.04 kg/m   Physical Exam Vitals and nursing note reviewed.  Constitutional:      Appearance: He is not ill-appearing or diaphoretic.  HENT:     Head: Normocephalic and atraumatic.  Eyes:     Conjunctiva/sclera: Conjunctivae normal.  Neck:     Comments: Negative spurling's test Cardiovascular:     Rate and Rhythm: Normal rate and regular rhythm.     Pulses: Normal pulses.  Pulmonary:     Effort: Pulmonary effort is normal.     Breath sounds: Normal breath sounds. No wheezing, rhonchi or rales.  Abdominal:     Palpations: Abdomen is soft.     Tenderness: There is no abdominal tenderness. There is no guarding or rebound.  Musculoskeletal:     Cervical back: Neck supple. No tenderness.     Comments: No C, T, or L midline spinal TTP. MAE without difficulty. Strength and sensation intact, equal bilaterally.   No obvious TTP to LUE including all joints. ROM intact to all joints of LUE. Negative Tinel's and Phalen's test. No TTP to the medial and lateral epicondyles. 2+ radial pulse.   Cap refill < 2 seconds to all toes of L foot. Able to wiggle toes without difficulty. Sensation intact. Strength 5/5 with plantarflexion and dorsiflexion.   Skin:    General: Skin is warm and dry.  Neurological:     Mental Status: He is alert.     Comments: CN 3-12 grossly intact A&O x4 GCS 15 Sensation and strength intact to BUE and BLEs Gait nonataxic including with tandem walking Coordination with finger-to-nose WNL Neg romberg, neg pronator drift     ED Results / Procedures / Treatments   Labs (all labs ordered are listed, but only abnormal results are displayed) Labs Reviewed  BASIC METABOLIC  PANEL - Abnormal; Notable for the following components:      Result Value   Potassium 3.4 (*)     Glucose, Bld 112 (*)    All other components within normal limits  CBC WITH DIFFERENTIAL/PLATELET - Abnormal; Notable for the following components:   WBC 3.5 (*)    All other components within normal limits    EKG None  Radiology CT Head Wo Contrast  Result Date: 02/19/2020 CLINICAL DATA:  69 year old male with cervical radiculopathy and left arm numbness. EXAM: CT HEAD WITHOUT CONTRAST CT CERVICAL SPINE WITHOUT CONTRAST TECHNIQUE: Multidetector CT imaging of the head and cervical spine was performed following the standard protocol without intravenous contrast. Multiplanar CT image reconstructions of the cervical spine were also generated. COMPARISON:  CT of the head and cervical spine dated 08/17/2015. FINDINGS: CT HEAD FINDINGS Brain: The ventricles and sulci appropriate size for patient's age. The gray-white matter discrimination is preserved. There is no acute intracranial hemorrhage. No mass effect or midline shift. No extra-axial fluid collection. Vascular: No hyperdense vessel or unexpected calcification. Skull: Normal. Negative for fracture or focal lesion. Sinuses/Orbits: No acute finding. Other: None CT CERVICAL SPINE FINDINGS Alignment: Normal. Skull base and vertebrae: No acute fracture Soft tissues and spinal canal: No prevertebral fluid or swelling. No visible canal hematoma. Disc levels: Mild degenerative changes. There is spurring and mild neural foramina narrowing at left C3-C4 and right C6-C7. Upper chest: Negative. Other: None IMPRESSION: 1. Unremarkable noncontrast CT of the brain. 2. No acute/traumatic cervical spine pathology. Mild degenerative changes and neural foramina narrowing at C3-C4 on the left and C6-C7 on the right. Electronically Signed   By: Anner Crete M.D.   On: 02/19/2020 17:01   CT Cervical Spine Wo Contrast  Result Date: 02/19/2020 CLINICAL DATA:  69 year old male with cervical radiculopathy and left arm numbness. EXAM: CT HEAD WITHOUT CONTRAST CT CERVICAL  SPINE WITHOUT CONTRAST TECHNIQUE: Multidetector CT imaging of the head and cervical spine was performed following the standard protocol without intravenous contrast. Multiplanar CT image reconstructions of the cervical spine were also generated. COMPARISON:  CT of the head and cervical spine dated 08/17/2015. FINDINGS: CT HEAD FINDINGS Brain: The ventricles and sulci appropriate size for patient's age. The gray-white matter discrimination is preserved. There is no acute intracranial hemorrhage. No mass effect or midline shift. No extra-axial fluid collection. Vascular: No hyperdense vessel or unexpected calcification. Skull: Normal. Negative for fracture or focal lesion. Sinuses/Orbits: No acute finding. Other: None CT CERVICAL SPINE FINDINGS Alignment: Normal. Skull base and vertebrae: No acute fracture Soft tissues and spinal canal: No prevertebral fluid or swelling. No visible canal hematoma. Disc levels: Mild degenerative changes. There is spurring and mild neural foramina narrowing at left C3-C4 and right C6-C7. Upper chest: Negative. Other: None IMPRESSION: 1. Unremarkable noncontrast CT of the brain. 2. No acute/traumatic cervical spine pathology. Mild degenerative changes and neural foramina narrowing at C3-C4 on the left and C6-C7 on the right. Electronically Signed   By: Anner Crete M.D.   On: 02/19/2020 17:01    Procedures Procedures (including critical care time)  Medications Ordered in ED Medications - No data to display  ED Course  I have reviewed the triage vital signs and the nursing notes.  Pertinent labs & imaging results that were available during my care of the patient were reviewed by me and considered in my medical decision making (see chart for details).    MDM Rules/Calculators/A&P  69 year old male presenting to the ED with chronic intermittent complaints of tingling/shock type pain in his left hand and tingling of all toes on L foot x several  months, worsened since having a botox injection into the rectum 5 weeks ago. Reports pain is present when he is having a BM. States he is here for an MRI however it does appear that pt presented to his PCP's office yesterday, unable to be evaluated, but was told to come to the ED to ensure pt was not having stroke like symptoms. He has no focal neuro deficits on exam today. He is anticoagulated with eliquis and denies missing any doses. Considering his sx have been present for months I do not suspect stroke at this time. It does appear pt has hx of intermittent left sided neck pain, xray in 2020 March with findings of cervical spondylosis. Question if pt is having cervical radiculopathy type sx; given he is here today will obtain CT C spine and CT Head (rule out any obvious bleed or intracranial abnormality). Will obtain screening labs. Pt has been out of his gabapentin which it appears he takes for neuropathy of his feet; will refill.   CT Head negative. CT C spine with foraminal stenosis left C3-C4 and right C6-C7; pt not complaining of any similar sensation in his R hand. Question if this is causing pt's symptoms today. Will plan for steroid dose pack and follow up with neurosurgery. Pt also requesting refill of his gabapentin; it appears we have lyrica listed on his med list and pt is not sure if he is supposed to be on the lyrica or gabapentin - have recommended he discuss this with PCP. Pt to be discharged with prednisone at this time and neuro follow up. He is in agreement with plan and stable for discharge home.   This note was prepared using Dragon voice recognition software and may include unintentional dictation errors due to the inherent limitations of voice recognition software.  This note was prepared using Dragon voice recognition software and may include unintentional dictation errors due to the inherent limitations of voice recognition software.  Final Clinical Impression(s) / ED  Diagnoses Final diagnoses:  Paresthesias  Foraminal stenosis of cervical region    Rx / DC Orders ED Discharge Orders         Ordered    predniSONE (DELTASONE) 50 MG tablet  Daily     Discontinue  Reprint     02/19/20 1736           Discharge Instructions     Please pick up prednisone and take as prescribed. Follow up with [facility] given the narrowing seen on the CT scan today.   Please discuss your medications including whether you are supposed to be on gabapentin or lyrica with your PCP   Return to the ED IMMEDIATELY for any worsening symptoms       Eustaquio Maize, PA-C 02/19/20 [place], Ankit, MD 02/19/20 2319

## 2020-02-19 NOTE — Telephone Encounter (Signed)
Noted  

## 2020-02-19 NOTE — ED Triage Notes (Signed)
Requesting an MRI

## 2020-02-19 NOTE — Addendum Note (Signed)
Addended by: Dorisann Frames on: 02/19/2020 03:18 PM   Modules accepted: Orders

## 2020-02-19 NOTE — Discharge Instructions (Signed)
Please pick up prednisone and take as prescribed. Follow up with [facility] given the narrowing seen on the CT scan today.   Please discuss your medications including whether you are supposed to be on gabapentin or lyrica with your PCP   Return to the ED IMMEDIATELY for any worsening symptoms

## 2020-02-19 NOTE — Telephone Encounter (Signed)
Pt called from ED with c/o rectal pain. Pt went to see Dr. Dema Severin and was told his fissure should be healed now per pt. Pt said he was asked to call our office. He would like to know if there is a cream otc he can use or prescription to help with the rectal pain. Pt is currently at the ED.

## 2020-02-19 NOTE — ED Triage Notes (Signed)
Left arm and left leg numbness intermittent for months

## 2020-02-19 NOTE — Telephone Encounter (Signed)
Patient with several ED visits. Current one unrelated to his rectal pain which is chronic.   Ok to save for input from [employer].

## 2020-02-24 ENCOUNTER — Telehealth: Payer: Self-pay | Admitting: Internal Medicine

## 2020-02-24 DIAGNOSIS — Z8719 Personal history of other diseases of the digestive system: Secondary | ICD-10-CM

## 2020-02-24 NOTE — Telephone Encounter (Signed)
He has seen Dr. Nadeen Landau for the anal pain and had botox for this in the past.   He would need to contact his GI or Dr. Dema Severin for this issue.

## 2020-02-24 NOTE — Telephone Encounter (Signed)
Spoke with pt. Sunday night pt started hurting when he had a BM. Pt states that he has stinging, burning, knife like pain with spasms before and after a BM. Pt went to see Dr. Dema Severin last week and was told that the fissure was healed. Dr. Dema Severin has recommended pelvic therapy for pt. Pt hasn't started therapy as of yet. Pt says Dr. Dema Severin didn't want pt to use topical cr on his rectum. Pt is asking if our office can recommend anything. Pt is taking Benefiber daily and backed off of the Miralax due to have several Bms a day.

## 2020-02-24 NOTE — Telephone Encounter (Signed)
[phone] PATIENT CALLED AND SAID HE IS HAVING PAIN AGAIN, PLEASE CALL

## 2020-02-24 NOTE — Telephone Encounter (Signed)
Patient notified, he will keep follow up here as scheduled

## 2020-02-25 NOTE — Telephone Encounter (Signed)
Pt called back. Pt said his pain in the rectum hasn't gotten any better. C/o burning, stinging, acing, throbbing. Pt called Dr. Dema Severin and there isn't anything else he is able to do per pt. Pt is sure if our office needs to refer him to a Gastro doctor at [facility] if there isn't another cream that can help him.

## 2020-02-26 NOTE — Telephone Encounter (Signed)
Pt called and said he is having the same pain, burning,  in his rectum. Pt noticed when he wiped, he saw a little blood on the tissue. Pt is currently using Deliazem cream on rectum tid. Pt says rectum burns some after applying cream.  Pt would also like to know if our office could write him out of work. Pt didn't specify of the amount of time needed. Per pt, he isn't able to work with the condition he is in due to not being able to stand for long periods of time.

## 2020-02-27 ENCOUNTER — Telehealth: Payer: Self-pay | Admitting: Student

## 2020-02-27 NOTE — Telephone Encounter (Signed)
Pt has questions for Nurse   Please call [phone]   Thanks renee

## 2020-02-27 NOTE — Telephone Encounter (Signed)
I notified patient of B.Strader's message.

## 2020-02-27 NOTE — Telephone Encounter (Signed)
Patient asks that we keep him out of work because he is sick. 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 His neurologist had written him out of work but will no longer do so.He also states his pcp "passed the buck" and will not help him. He is now asking [place].

## 2020-02-27 NOTE — Telephone Encounter (Signed)
     From a cardiac perspective, he only has a history of paroxysmal atrial fibrillation and this was well-controlled at the time from his last visit in 11/2019 prior to his surgery. By review of phone notes, he has been having GI issues. Unfortunately, I do not have anything to offer from a cardiac perspective in relation to this.   [place],  [place]

## 2020-03-01 ENCOUNTER — Telehealth: Payer: Self-pay

## 2020-03-01 ENCOUNTER — Other Ambulatory Visit (HOSPITAL_COMMUNITY): Payer: Self-pay | Admitting: Neurology

## 2020-03-01 NOTE — Telephone Encounter (Signed)
It has been very difficult to manage his rectal symptoms. We have exhausted essentially all topical medications to help with this. At this point, I am sure he has several ractal creams at home that he may be using. However, they are not all effective for the same thing.   If he is having knife like pain with BMs, he can continue using the diltiazem cream for now. He should only be applying a pea sized amount to his rectum QID. If he is just having some burning/discomfort, he can use the Anusol cream BID x7 days.   I agree that patient should pursue biofeedback therapy. I had reviewed patients recent note with Dr. While and sent it for scanning. I do not see that it has been scanned yet. Can we request this again, so I can review one more time before referring for biofeedback therapy? We can also refer him to [facility] if he desires.

## 2020-03-01 NOTE — Telephone Encounter (Signed)
Spoke with pt. Pt is aware that Per [name], PA surgery recommended biofeedback to help. Walden Field, NP would recommend starting there. If this is not helpful, at this point he may need to be referred to tertiary care. Message was sent to [facility] for further recommendations.

## 2020-03-01 NOTE — Telephone Encounter (Signed)
Pt left a voice mail  Returned pt call. Pt called with severe rectal pain.( on going issue)  Asking to see MD ASAP.  Dr. Alyson Ingles schedule is open tomorrow and agreed to see pt ( currently treated by Dr. Jeffie Pollock) appt given to pt.

## 2020-03-01 NOTE — Telephone Encounter (Signed)
From what I can see we have tried diltiazem, anusol, lidocaine creams. If these creams haven't helped, I am not sure they would at this point. Per [name], PA surgery recommended biofeedback to help. I would recommend starting there. If this is not helpful, at this point he may need to be referred to tertiary care.  Cc: to [name] for any other feedback she may have as she knows the patient well.

## 2020-03-02 ENCOUNTER — Other Ambulatory Visit: Payer: Self-pay

## 2020-03-02 ENCOUNTER — Encounter: Payer: Self-pay | Admitting: Gastroenterology

## 2020-03-02 ENCOUNTER — Other Ambulatory Visit: Payer: BC Managed Care – PPO

## 2020-03-02 ENCOUNTER — Ambulatory Visit (HOSPITAL_COMMUNITY): Payer: BC Managed Care – PPO | Attending: Surgery | Admitting: Physical Therapy

## 2020-03-02 ENCOUNTER — Ambulatory Visit (INDEPENDENT_AMBULATORY_CARE_PROVIDER_SITE_OTHER): Payer: BC Managed Care – PPO | Admitting: Urology

## 2020-03-02 VITALS — BP 128/67 | HR 54 | Temp 97.8°F | Ht 66.0 in | Wt 167.6 lb

## 2020-03-02 DIAGNOSIS — N401 Enlarged prostate with lower urinary tract symptoms: Secondary | ICD-10-CM | POA: Diagnosis not present

## 2020-03-02 DIAGNOSIS — G8929 Other chronic pain: Secondary | ICD-10-CM | POA: Diagnosis present

## 2020-03-02 DIAGNOSIS — R262 Difficulty in walking, not elsewhere classified: Secondary | ICD-10-CM | POA: Diagnosis present

## 2020-03-02 DIAGNOSIS — R3912 Poor urinary stream: Secondary | ICD-10-CM

## 2020-03-02 DIAGNOSIS — R102 Pelvic and perineal pain: Secondary | ICD-10-CM | POA: Diagnosis not present

## 2020-03-02 DIAGNOSIS — N138 Other obstructive and reflux uropathy: Secondary | ICD-10-CM

## 2020-03-02 DIAGNOSIS — M7918 Myalgia, other site: Secondary | ICD-10-CM | POA: Insufficient documentation

## 2020-03-02 LAB — URINALYSIS, ROUTINE W REFLEX MICROSCOPIC
Bilirubin, UA: NEGATIVE
Glucose, UA: NEGATIVE
Ketones, UA: NEGATIVE
Leukocytes,UA: NEGATIVE
Nitrite, UA: NEGATIVE
Protein,UA: NEGATIVE
Specific Gravity, UA: <1.005 — ABNORMAL LOW (ref 1.005–1.030)
Urobilinogen, Ur: 0.2 mg/dL (ref 0.2–1.0)
pH, UA: 6.5 (ref 5.0–7.5)

## 2020-03-02 LAB — MICROSCOPIC EXAMINATION
Bacteria, UA: NONE SEEN
Epithelial Cells (non renal): NONE SEEN /hpf (ref 0–10)
Renal Epithel, UA: NONE SEEN /hpf
WBC, UA: NONE SEEN /hpf (ref 0–5)

## 2020-03-02 MED ORDER — ALFUZOSIN HCL ER 10 MG PO TB24: 10.0000 mg | ORAL_TABLET | Freq: Every day | ORAL | 11 refills | Status: DC

## 2020-03-02 NOTE — Telephone Encounter (Signed)
Spoke with pt. He is currently at an appointment and will call our office back.

## 2020-03-02 NOTE — Telephone Encounter (Signed)
Noted. Will defer additional recommendations regarding disability to [place]: Please arrange referral to [facility]. Dx: Chronic rectal pain, history of anal fissure s/p botox therapy

## 2020-03-02 NOTE — Progress Notes (Signed)
Urological Symptom Review  Patient is experiencing the following symptoms: Erection problems (male only)   Review of Systems  Gastrointestinal (upper)  : Negative for upper GI symptoms  Gastrointestinal (lower) : Constipation  Constitutional : Weight loss  Skin: Negative for skin symptoms  Eyes: Negative for eye symptoms  Ear/Nose/Throat : Negative for Ear/Nose/Throat symptoms  Hematologic/Lymphatic: Negative for Hematologic/Lymphatic symptoms  Cardiovascular : Negative for cardiovascular symptoms  Respiratory : Negative for respiratory symptoms  Endocrine: Negative for endocrine symptoms  Musculoskeletal: Negative for musculoskeletal symptoms  Neurological: Negative for neurological symptoms  Psychologic: Negative for psychiatric symptoms

## 2020-03-02 NOTE — Progress Notes (Signed)
03/02/2020 2:56 PM   Steven [name] [birth year] [id number]  Referring provider: Jake Samples, PA-C [address] [place],  [place] [address]  Perineal pain  HPI: Mr Banfill is a 69yo here for evaluation of perinela pain. He was seen by Dr. Jeffie Pollock for prostate cancer and BPH in 05/2019. He has a hx of IMRT in 09/2011. He has had issues with rectal pain worse with bowel movements. He has been evaluated by GI and Dr. Dema Severin at [place]. He had perianal botox in 12/2019 with mild improvement in his perineal/rectal pain/burning.  He has intermittent weak stream, urinary urgency, and frequency. He was previously treated with flomax which caused lower extremity weakness.   Dr. Dema Severin referred him to pelvic physical therapy.    PMH: Past Medical History:  Diagnosis Date  . Arthritis   . Atrial fibrillation ([place])   . Atrial flutter ([place])   . BPH (benign prostatic hyperplasia)   . Chest pain   . Dizziness   . Dyspnea    laying down occ  . Fracture 08/17/2015   MULTIPLE RIB FRACTURES     FROM FALL   . GERD (gastroesophageal reflux disease)   . Hemorrhoids   . History of kidney stones    noted on CT scan  . History of radiation therapy 08/22/11-10/13/11   prostate  . Hyperlipemia   . Hypertension   . Hyperthyroidism   . Light headedness   . Numbness and tingling in left arm   . Numbness and tingling of both legs   . Prostate cancer [facility])    prostate s/p radiation Mar 2013    Surgical History: Past Surgical History:  Procedure Laterality Date  . BOTOX INJECTION N/A 01/14/2020   Procedure: INJECTION OF BOTOX INTO ANAL SPHINCTER;  Surgeon: Ileana Roup, MD;  [facility]: [facility];  Service: General;  Laterality: N/A;  . COLONOSCOPY N/A 12/19/2012   XBM:[id number] 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 Normal appearing colon  . COLONOSCOPY WITH PROPOFOL N/A 10/23/2019   Procedure: COLONOSCOPY WITH PROPOFOL;  Surgeon:  Daneil Dolin, MD; external and grade 2 internal hemorrhoids, abnormal rectal blood vessels consistent with radiation proctitis s/p APC therapy, otherwise normal exam.  . EVALUATION UNDER ANESTHESIA WITH ANAL FISTULECTOMY N/A 01/14/2020   Procedure: ANORECTAL EXAM UNDER ANESTHESIA;  Surgeon: Ileana Roup, MD;  [facility];  Service: General;  Laterality: N/A;  . HOT HEMOSTASIS  10/23/2019   Procedure: HOT HEMOSTASIS (ARGON PLASMA COAGULATION/BICAP);  Surgeon: Daneil Dolin, MD;  [facility];  Service: Endoscopy;;  apc rectal proctitis    . KIDNEY SURGERY  1982   kidney tube collapse repair  . RADIOACTIVE SEED IMPLANT     Prostate    Home Medications:  Allergies as of 03/02/2020   No Known Allergies     Medication List       Accurate as of March 02, 2020  2:56 PM. If you have any questions, ask your nurse or doctor.        amLODipine 10 MG tablet Commonly known as: NORVASC Take 1 tablet (10 mg total) by mouth daily.   amLODipine 5 MG tablet Commonly known as: NORVASC Take 5 mg by mouth daily.   apixaban 5 MG Tabs tablet Commonly known as: Eliquis Take 1 tablet (5 mg total) by mouth 2 (two) times daily.   B-12 PO Take 1 tablet by mouth daily.   CoQ10 100 MG Caps Take  100 mg by mouth daily.   diazepam 5 MG tablet Commonly known as: VALIUM Take 5 mg by mouth daily as needed for anxiety.   gabapentin 300 MG capsule Commonly known as: NEURONTIN Take 300 mg by mouth 3 (three) times daily.   hydrochlorothiazide 25 MG tablet Commonly known as: HYDRODIURIL Take 25 mg by mouth daily.   hydrocortisone 2.5 % cream hydrocortisone 2.5 % topical cream   methimazole 5 MG tablet Commonly known as: TAPAZOLE TAKE ONE TABLET BY MOUTH ONCE DAILY.   metoprolol tartrate 25 MG tablet Commonly known as: LOPRESSOR Take 1 tablet (25 mg total) by mouth 2 (two) times daily.   Nexletol 180 MG Tabs Generic drug: Bempedoic Acid Take 180 mg by mouth daily.    pantoprazole 40 MG tablet Commonly known as: PROTONIX TAKE 1 TABLET BY MOUTH DAILY 30 MINUTES BEFORE BREAKFAST   potassium chloride 10 MEQ tablet Commonly known as: KLOR-CON potassium chloride ER 10 mEq tablet,extended release   potassium chloride SA 20 MEQ tablet Commonly known as: KLOR-CON Take 20 mEq by mouth 2 (two) times daily.   pregabalin 75 MG capsule Commonly known as: LYRICA Take 75 mg by mouth 2 (two) times daily.   PROBIOTIC-10 PO Take 1 capsule by mouth daily.   rosuvastatin 10 MG tablet Commonly known as: CRESTOR Take 10 mg by mouth at bedtime.   topiramate 100 MG tablet Commonly known as: TOPAMAX Take 100 mg by mouth at bedtime.   traMADol 50 MG tablet Commonly known as: ULTRAM tramadol 50 mg tablet  TAKE 1 TABLET BY MOUTH EVERY 6 HOURS AS NEEDED FOR UP TO 5 DAYS FOR SEVERE POSTOP PAIN NOT CONTROLLED WITH TYLENO/IBUPROFEN   Vitamin D3 125 MCG (5000 UT) Caps Take 1 capsule by mouth daily.   VITAMIN E PO Take 1 tablet by mouth daily.   zinc gluconate 50 MG tablet Take 50 mg by mouth daily.       Allergies: No Known Allergies  Family History: Family History  Problem Relation Age of Onset  . Aneurysm Mother   . Hypertension Mother   . Prostate cancer Father   . Hypertension Father   . Colon cancer Neg Hx   . Colon polyps Neg Hx     Social History:  reports that he quit smoking about 19 months ago. His smoking use included cigarettes. He has a 20.00 pack-year smoking history. He has quit using smokeless tobacco. He reports that he does not drink alcohol and does not use drugs.  ROS: All other review of systems were reviewed and are negative except what is noted above in HPI  Physical Exam: BP 128/67   Pulse (!) 54   Temp 97.8 F (36.6 C)   Ht 5\' 6"  (1.676 m)   Wt 167 lb 8.8 oz (76 kg)   BMI 27.04 kg/m   Constitutional:  Alert and oriented, No acute distress. HEENT: Basile AT, moist mucus membranes.  Trachea midline, no  masses. Cardiovascular: No clubbing, cyanosis, or edema. Respiratory: Normal respiratory effort, no increased work of breathing. GI: Abdomen is soft, nontender, nondistended, no abdominal masses GU: No CVA tenderness. Lymph: No cervical or inguinal lymphadenopathy. Skin: No rashes, bruises or suspicious lesions. Neurologic: Grossly intact, no focal deficits, moving all 4 extremities. Psychiatric: Normal mood and affect.  Laboratory Data: Lab Results  Component Value Date   WBC 3.5 (L) 02/19/2020   HGB 13.6 02/19/2020   HCT 40.0 02/19/2020   MCV 90.3 02/19/2020   PLT 250 02/19/2020  Lab Results  Component Value Date   CREATININE 0.98 02/19/2020    Lab Results  Component Value Date   PSA 1.8 12/10/2019   PSA 2.3 11/10/2019    No results found for: TESTOSTERONE  No results found for: HGBA1C  Urinalysis    Component Value Date/Time   COLORURINE YELLOW 01/22/2020 0957   APPEARANCEUR Clear 03/02/2020 1443   LABSPEC 1.008 01/22/2020 0957   PHURINE 6.5 01/22/2020 0957   GLUCOSEU Negative 03/02/2020 1443   HGBUR NEGATIVE 01/22/2020 0957   BILIRUBINUR Negative 03/02/2020 1443   KETONESUR NEGATIVE 01/22/2020 0957   PROTEINUR Negative 03/02/2020 1443   PROTEINUR NEGATIVE 01/22/2020 0957   UROBILINOGEN negative (A) 12/12/2019 1113   UROBILINOGEN 1.0 08/03/2012 1156   NITRITE Negative 03/02/2020 1443   NITRITE NEGATIVE 01/22/2020 0957   LEUKOCYTESUR Negative 03/02/2020 1443   LEUKOCYTESUR NEGATIVE 01/22/2020 0957    Lab Results  Component Value Date   LABMICR See below: 03/02/2020   WBCUA None seen 03/02/2020   LABEPIT None seen 03/02/2020   BACTERIA None seen 03/02/2020    Pertinent Imaging:  Results for orders placed during the hospital encounter of 11/07/19  DG Abdomen 1 View  Narrative CLINICAL DATA:  Constipation. Hemorrhoids. Irritable bowel syndrome. Prostate cancer.  EXAM: ABDOMEN - 1 VIEW  COMPARISON:  CT pelvis 09/25/2019 in CT abdomen  04/29/2019  FINDINGS: Oval-shaped densities along the midline of the upper abdomen, possibly in the stomach. The patient had gallstones on the 04/29/2019 exam but these densities appear more regular and more medially located than expected location for gallstones. There is another oval-shaped density projecting over the transverse colon and 1 along the rectum, again I suspect that these are within stomach/bowel.  The tiny punctate right kidney lower pole renal calculus shown on 04/29/2019 is not readily seen on today's conventional radiographs.  Dextroconvex lumbar scoliosis with rotary component. No dilated bowel. Bowel gas pattern appears otherwise unremarkable.  IMPRESSION: 1. Oval-shaped densities in the abdomen are thought to be in the stomach and bowel. 2. No dilated bowel.  Unremarkable bowel gas pattern. 3. Dextroconvex lumbar scoliosis with rotary component.   Electronically Signed By: Van Clines M.D. On: 11/07/2019 17:35  No results found for this or any previous visit.  No results found for this or any previous visit.  No results found for this or any previous visit.  No results found for this or any previous visit.  No results found for this or any previous visit.  No results found for this or any previous visit.  No results found for this or any previous visit.   Assessment & Plan:    1. Benign prostatic hyperplasia with urinary obstruction, weak stream -We will trial uroxatral 10mg  qhs. - PSA - Urinalysis, Routine w reflex microscopic  2. Perineal pain -We discussed the natural hx of perineal pain and the various causes. We will treat him for chronic prostatitis.    No follow-ups on file.  Nicolette Bang, MD  [facility] [place]

## 2020-03-02 NOTE — Telephone Encounter (Signed)
Noted, before pt is called. Pt is going to ask about being written out of work. Pt states his disability forms are getting ready to run out and he needs a physician to write him out of work. Pt states he can't stand for long periods of time and his PCP and other physicians, will no longer provide approval for disability. Please advise.

## 2020-03-02 NOTE — Telephone Encounter (Signed)
Pt called back and informed him of [name], PA's recommendations.  He said that we must have misunderstood.  He said that he has been out of work since last August due to pain in rectum and A fib.  Pt said that PCP will do disability for next 6 months but wants to know if we will agree to helping him with disability after that if he still has GI issues.  Pt is interested in referral to [place], [place].

## 2020-03-02 NOTE — Telephone Encounter (Signed)
We can provide a note to have him excused for the next 4-6 weeks. I do not have anything else to offer for further management of his chronic symptoms. He is going to need to see [facility] for further evaluation/management, and they can then provide additional work notes if needed.

## 2020-03-02 NOTE — Therapy (Signed)
Steven [name], [place], [address] Phone: [phone]   Fax:  [phone]  Physical Therapy Evaluation  Patient Details  Name: Steven [name] MRN: [id number] Date of Birth: April 15, 1951 Referring Provider (PT): Steven [name]   Encounter Date: 03/02/2020   PT End of Session - 03/02/20 1145    Visit Number 1    Number of Visits 4    Date for PT Re-Evaluation 04/01/20    Authorization Type BCBS    Progress Note Due on Visit 4    PT Start Time 1125    PT Stop Time 1210    PT Time Calculation (min) 45 min           Past Medical History:  Diagnosis Date  . Arthritis   . Atrial fibrillation ([place])   . Atrial flutter ([place])   . BPH (benign prostatic hyperplasia)   . Chest pain   . Dizziness   . Dyspnea    laying down occ  . Fracture 08/17/2015   MULTIPLE RIB FRACTURES     FROM FALL   . GERD (gastroesophageal reflux disease)   . Hemorrhoids   . History of kidney stones    noted on CT scan  . History of radiation therapy 08/22/11-10/13/11   prostate  . Hyperlipemia   . Hypertension   . Hyperthyroidism   . Light headedness   . Numbness and tingling in left arm   . Numbness and tingling of both legs   . Prostate cancer [facility])    prostate s/p radiation Mar 2013    Past Surgical History:  Procedure Laterality Date  . BOTOX INJECTION N/A 01/14/2020   Procedure: INJECTION OF BOTOX INTO ANAL SPHINCTER;  Surgeon: Steven Roup, MD;  [facility];  Service: General;  Laterality: N/A;  . COLONOSCOPY N/A 12/19/2012   [id number]:[id number] 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 Normal appearing colon  . COLONOSCOPY WITH PROPOFOL N/A 10/23/2019   Procedure: COLONOSCOPY WITH PROPOFOL;  Surgeon: Steven Dolin, MD; external and grade 2 internal hemorrhoids, abnormal rectal blood vessels consistent with radiation proctitis s/p APC therapy, otherwise normal exam.    . EVALUATION UNDER ANESTHESIA WITH ANAL FISTULECTOMY N/A 01/14/2020   Procedure: ANORECTAL EXAM UNDER ANESTHESIA;  Surgeon: Steven Roup, MD;  [facility];  Service: General;  Laterality: N/A;  . HOT HEMOSTASIS  10/23/2019   Procedure: HOT HEMOSTASIS (ARGON PLASMA COAGULATION/BICAP);  Surgeon: Steven Dolin, MD;  [facility];  Service: Endoscopy;;  apc rectal proctitis    . KIDNEY SURGERY  1982   kidney tube collapse repair  . RADIOACTIVE SEED IMPLANT     Prostate    There were no vitals filed for this visit.    Subjective Assessment - 03/02/20 1124    Subjective Steven [name] states that he has been bothered by having pain in his rectum for a year, it got bad in April.  He has been to a GI MD then referred to a specialist in [place] who stated that he had an anal fissure which was treated by increasing his water intake and changing his diet and using an external cream.  He did this for eight weeks but it did not help.  He then did a botox procedure on June 23rd.  This did not help his pain, however, the MD stated that the fissure is healed and discharged him.  He states that his rectum  is sore, burns and aches after he has a BM.  He his doing Sit baths, he is sitting on pillows.  The only thing that really helps is if he is off his feet.  He has to be off his feet about 6 hours a day, He can only be on his feet for about 6-8 minutes at a time.  He states that he is doubling up on his laxatives.  He states that if he had to have a bowel movement if he is out he has to go home because the pain is so bad.  He had radiation in 2012 for prostrate cancer and has been told that he has scar tissue from this. PT states that everytime he has a bowel movement his Lt hand has electric type shock states that he takes gabapentin which helps this.    Pertinent History prostate cancer; irritable bowel syndrome; carpal tunnel; 2017 fx radius LT    How long can you sit comfortably?  sometimes 15 minutes with his pillow other times he can go pretty good but not after a BM, after a BM he has to get off his feet.    How long can you stand comfortably? 6-8 minutes    How long can you walk comfortably? changes 10-15  minutes on a good day    Patient Stated Goals Pt is having 1-2  bowel movemetns a day when he does he has to lie down for 6-8 hrs a day.    Currently in Pain? Yes    Pain Score 3    after bowel movement 10/10   Pain Location Buttocks    Pain Descriptors / Indicators Burning    Pain Type Chronic pain    Pain Onset More than a month ago    Pain Frequency Intermittent    Aggravating Factors  bowel movement    Pain Relieving Factors lying down    Effect of Pain on Daily Activities kimits              [facility] PT Assessment - 03/02/20 0001      Assessment   Medical Diagnosis levator ani Syndrome    Referring Provider (PT) Steven [name]    Onset Date/Surgical Date 02/22/19    Next MD Visit no follow up     Prior Therapy none      Precautions   Precautions None      Restrictions   Weight Bearing Restrictions No      Balance Screen   Has the patient fallen in the past 6 months No      [place] residence      Prior Function   Level of Independence Independent      Cognition   Overall Cognitive Status Within Functional Limits for tasks assessed                      Objective measurements completed on examination: See above findings.       [place] Adult PT Treatment/Exercise - 03/02/20 0001      Exercises   Exercises Lumbar      Lumbar Exercises: Seated   Other Seated Lumbar Exercises Kegal 2 hold/4 rest; Kegal 10 hold 20 rest x 10     Other Seated Lumbar Exercises glut sets 10 second hold / 20 seconds rest                   PT Education - 03/02/20 1202  Education Details HEP    Person(s) Educated Patient    Methods Explanation;Handout    Comprehension Verbalized  understanding            PT Short Term Goals - 03/02/20 1216      PT SHORT TERM GOAL #1   Title Pt pain in rectum after bowel movement to be no greater than a 5/10 to allow pt to not have to rest any longer than 3 hours    Time 2    Period Weeks    Status New    Target Date 03/16/20             PT Long Term Goals - 03/02/20 1217      PT LONG TERM GOAL #1   Title Pt to state that his pain after bowel movement to be no greater than a 3/10  and only have lie down for an hour following a bowel movement .    Time 4    Period Weeks    Status New    Target Date 03/30/20      PT LONG TERM GOAL #2   Title PT to be able to sit for 30-40  minutes in comfort to be able to enjoy a meal    Time 4    Period Weeks    Status New      PT LONG TERM GOAL #3   Title Pt to be able to stand for 15 minutes without increased pain to be able to make a quick meal and complete self grooming tasks.    Time 4    Period Weeks    Status New      PT LONG TERM GOAL #4   Title PT to be able to walk for 30 minutes to be able to complete short shopping trips.    Time 4    Period Weeks    Status New                  Plan - 03/02/20 1146    Clinical Impression Statement PT is a 69 yo male who has been experiencing pain in his rectum and has tingling in his Lt hand whenever he has a bowel movement.  He has such intense pain that he has to lie down for 6-8 hours.  He has been to specialists and nobody can explain his symptoms.  He is now being referred to skilled PT>  Evaluation demonstrates decreased activity tolerance and increased pain.  He will benefit from skilled PT to address these issues and attempg to improve his functional ability.    Examination-Activity Limitations Sit;Locomotion Level;Stand;Toileting    Examination-Participation Restrictions Church;Cleaning;Community Activity;Shop;Yard Work    [employer] Evolving/Moderate complexity    Clinical Decision  Making Moderate    Rehab Potential Fair    PT Frequency 1x / week    PT Duration 4 weeks    PT Treatment/Interventions Patient/family education;Therapeutic exercise;Manual techniques    PT Next Visit Plan begin manual with gun to glutes, progress pelvic floor exercises.    PT Home Exercise Plan Kegal both fast and slow twitch           Patient will benefit from skilled therapeutic intervention in order to improve the following deficits and impairments:  Pain, Increased muscle spasms, Decreased activity tolerance  Visit Diagnosis: Difficulty in walking, not elsewhere classified - Plan: PT plan of care cert/re-cert  Chronic buttock pain - Plan: PT plan of care cert/re-cert  Problem List Patient Active Problem List   Diagnosis Date Noted  . Subacute thyroiditis 01/07/2020  . Personal history of malignant neoplasm of prostate 12/12/2019  . Benign prostatic hyperplasia with urinary obstruction 12/12/2019  . Weak urinary stream 12/12/2019  . Hyperthyroidism 11/24/2019  . Weight loss 10/21/2019  . IBS (irritable bowel syndrome) 10/21/2019  . Rectal pain 08/21/2019  . Nausea without vomiting 05/30/2019  . Atrial flutter ([place]) 04/17/2019  . Atrial flutter, paroxysmal ([place]) 03/02/2019  . HTN (hypertension) 03/02/2019  . Elevated troponin 03/01/2019  . Hypokalemia 03/01/2019  . Constipation 08/02/2017  . Hemorrhoids 08/02/2017  . Fall 08/18/2015  . Open fracture of left elbow 08/18/2015  . Rib fractures 08/17/2015  . Radiation proctitis 01/30/2013  . Rectal bleeding 12/10/2012  . Chest pain 05/30/2012  . Elevated lipids 05/30/2012  . Palpitations 05/30/2012  . Dyspnea 05/30/2012  . Obese 05/30/2012  . Prostate cancer [facility]) 08/13/2011    Rayetta Humphrey, PT CLT [phone] 03/02/2020, 12:26 PM  [place] [address] [place], [place], [address] Phone: [phone]   Fax:  [phone]  Name: Steven [name] MRN:  [id number] Date of Birth: 11/21/1950

## 2020-03-02 NOTE — Patient Instructions (Signed)
Prostatitis  Prostatitis is swelling of the prostate gland. The prostate helps to make semen. It is below a man's bladder, in front of the rectum. There are different types of prostatitis. Follow these instructions at home:   Take over-the-counter and prescription medicines only as told by your doctor.  If you were prescribed an antibiotic medicine, take it as told by your doctor. Do not stop taking the antibiotic even if you start to feel better.  If your doctor prescribed exercises, do them as directed.  Take sitz baths as told by your doctor. To take a sitz bath, sit in warm water that is deep enough to cover your hips and butt.  Keep all follow-up visits as told by your doctor. This is important. Contact a doctor if:  Your symptoms get worse.  You have a fever. Get help right away if:  You have chills.  You feel sick to your stomach (nauseous).  You throw up (vomit).  You feel light-headed.  You feel like you might pass out (faint).  You cannot pee (urinate).  You have blood or clumps of blood (blood clots) in your pee (urine). This information is not intended to replace advice given to you by your health care provider. Make sure you discuss any questions you have with your health care provider. Document Revised: 06/22/2017 Document Reviewed: 03/30/2016 Elsevier Patient Education  2020 Elsevier Inc.  

## 2020-03-03 LAB — PSA: Prostate Specific Ag, Serum: 2.4 ng/mL (ref 0.0–4.0)

## 2020-03-03 NOTE — Progress Notes (Signed)
Letter sent via my chart

## 2020-03-03 NOTE — Telephone Encounter (Signed)
Called [facility]. Was advised they have [place] office and I need to fax the referral to them for review to [phone]. Records/referral faxed.

## 2020-03-03 NOTE — Addendum Note (Signed)
Addended by: Cheron Every on: 03/03/2020 07:57 AM   Modules accepted: Orders

## 2020-03-04 DIAGNOSIS — R202 Paresthesia of skin: Secondary | ICD-10-CM | POA: Insufficient documentation

## 2020-03-05 ENCOUNTER — Other Ambulatory Visit: Payer: Self-pay | Admitting: Neurosurgery

## 2020-03-05 ENCOUNTER — Other Ambulatory Visit (HOSPITAL_COMMUNITY): Payer: Self-pay | Admitting: Neurosurgery

## 2020-03-05 DIAGNOSIS — M5412 Radiculopathy, cervical region: Secondary | ICD-10-CM

## 2020-03-05 NOTE — Telephone Encounter (Signed)
Per [facility] referral notes: "We received a referral for history of anal fissure. I left a message on patient's voicemail to schedule NPV at [facility] with either Rolene Course, Dr. Jamey Reas, or Dr. Jerene Pitch. AC, when patient calls back please schedule with one of these providers, first available.  Electronically signed by Eustace Quail at 03/03/2020 12:19 PM EDT "

## 2020-03-08 ENCOUNTER — Other Ambulatory Visit: Payer: Self-pay | Admitting: Cardiovascular Disease

## 2020-03-08 NOTE — Telephone Encounter (Signed)
Pt's age 69, wt 50 kg, SCr 0.98, CrCl 77.55, last ov w/ BS 12/05/19.

## 2020-03-09 ENCOUNTER — Telehealth: Payer: Self-pay | Admitting: Cardiovascular Disease

## 2020-03-09 ENCOUNTER — Telehealth: Payer: Self-pay | Admitting: Internal Medicine

## 2020-03-09 NOTE — Telephone Encounter (Signed)
Pt concerned that his BP is low and would like to speak with a Nurse    Please call [phone]    Thanks renee

## 2020-03-09 NOTE — Telephone Encounter (Signed)
Patient decided to reduce amlodipine to 5 mg because he considered his BP too low.109/64, 113/60,119/64   He states he will watch BP and let us know if his readings increase.   He also states he wanted off Eliquis and just take aspirin but I informed him ASA alone will not protect him from stroke like Eliquis does.

## 2020-03-09 NOTE — Telephone Encounter (Signed)
Agree with talking with pharmacy about refill. They can send [place] the request if needed. I am not sure what cream he is referring to as there have been several prescribed.   Regarding his upcoming appt. He has an appt with me on 8/23 and an appt with [facility] on 9/2. If he prefers to reschedule our office visit to follow his office visit with [facility], that will be fine. I am not sure when the next available appointment is.

## 2020-03-09 NOTE — Telephone Encounter (Signed)
Spoke with pt. Pt was asking for a Refill of his topical cream for his rectum. Pt has one refill left on the bottle. Pt was asked to call the pharmacy to get the cream filled if needed.   Pt is schedule for an appointment next week with [name], PA. Pt would like to know if he needs to keep this appointment since he has the referral to the other office? Please advise.

## 2020-03-09 NOTE — Telephone Encounter (Signed)
Lmom, waiting on a return call.  

## 2020-03-09 NOTE — Telephone Encounter (Signed)
Pt called asking for Steven [name] or Steven [name]. I told him both were with patients and I would have to take a message. He wants to speak with them about a medication and he might need a refill. I told him if he needed a refill to have his pharmacy fax [place] a refill request. [phone]

## 2020-03-10 ENCOUNTER — Other Ambulatory Visit: Payer: Self-pay

## 2020-03-10 ENCOUNTER — Ambulatory Visit (INDEPENDENT_AMBULATORY_CARE_PROVIDER_SITE_OTHER): Payer: BC Managed Care – PPO | Admitting: Nurse Practitioner

## 2020-03-10 ENCOUNTER — Encounter (INDEPENDENT_AMBULATORY_CARE_PROVIDER_SITE_OTHER): Payer: Self-pay

## 2020-03-10 NOTE — Telephone Encounter (Signed)
Left a detailed message for pt. Pt returned call and is aware that his pharmacy can send a request to our office for a refill and if pt wants to push his apt out, it's ok. Pt says he'll call back once he decides if he wants to keep apt or push it out.

## 2020-03-11 ENCOUNTER — Ambulatory Visit (HOSPITAL_COMMUNITY): Payer: BC Managed Care – PPO | Admitting: Physical Therapy

## 2020-03-11 ENCOUNTER — Encounter (HOSPITAL_COMMUNITY): Payer: Self-pay

## 2020-03-11 ENCOUNTER — Ambulatory Visit (HOSPITAL_COMMUNITY): Payer: BC Managed Care – PPO

## 2020-03-11 DIAGNOSIS — R262 Difficulty in walking, not elsewhere classified: Secondary | ICD-10-CM

## 2020-03-11 DIAGNOSIS — G8929 Other chronic pain: Secondary | ICD-10-CM

## 2020-03-11 DIAGNOSIS — M7918 Myalgia, other site: Secondary | ICD-10-CM

## 2020-03-11 NOTE — Therapy (Signed)
[place], [place], [address] Phone: [phone]   Fax:  [phone]  Physical Therapy Treatment  Patient Details  Name: Steven [name] MRN: [id number] Date of Birth: 06/30/1951 Referring Provider (Steven): Nadeen Landau   Encounter Date: 03/11/2020   Steven End of Session - 03/11/20 1148    Visit Number 2    Number of Visits 4    Date for Steven Re-Evaluation 04/01/20    Authorization Type BCBS    Authorization - Number of Visits 2    Progress Note Due on Visit 4    Steven Start Time 2725    Steven Stop Time 1215    Steven Time Calculation (min) 40 min           Past Medical History:  Diagnosis Date  . Arthritis   . Atrial fibrillation ([place])   . Atrial flutter ([place])   . BPH (benign prostatic hyperplasia)   . Chest pain   . Dizziness   . Dyspnea    laying down occ  . Fracture 08/17/2015   MULTIPLE RIB FRACTURES     FROM FALL   . GERD (gastroesophageal reflux disease)   . Hemorrhoids   . History of kidney stones    noted on CT scan  . History of radiation therapy 08/22/11-10/13/11   prostate  . Hyperlipemia   . Hypertension   . Hyperthyroidism   . Light headedness   . Numbness and tingling in left arm   . Numbness and tingling of both legs   . Prostate cancer [facility])    prostate s/p radiation Mar 2013    Past Surgical History:  Procedure Laterality Date  . BOTOX INJECTION N/A 01/14/2020   Procedure: INJECTION OF BOTOX INTO ANAL SPHINCTER;  Surgeon: Ileana Roup, MD;  Location: [facility];  Service: General;  Laterality: N/A;  . COLONOSCOPY N/A 12/19/2012   [id number] 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 Normal appearing colon  . COLONOSCOPY WITH PROPOFOL N/A 10/23/2019   Procedure: COLONOSCOPY WITH PROPOFOL;  Surgeon: Daneil Dolin, MD; external and grade 2 internal hemorrhoids, abnormal rectal blood vessels consistent with radiation proctitis s/p  APC therapy, otherwise normal exam.  . EVALUATION UNDER ANESTHESIA WITH ANAL FISTULECTOMY N/A 01/14/2020   Procedure: ANORECTAL EXAM UNDER ANESTHESIA;  Surgeon: Ileana Roup, MD;  Location: [facility];  Service: General;  Laterality: N/A;  . HOT HEMOSTASIS  10/23/2019   Procedure: HOT HEMOSTASIS (ARGON PLASMA COAGULATION/BICAP);  Surgeon: Daneil Dolin, MD;  Location[facility];  Service: Endoscopy;;  apc rectal proctitis    . KIDNEY SURGERY  1982   kidney tube collapse repair  . RADIOACTIVE SEED IMPLANT     Prostate    There were no vitals filed for this visit.   Subjective Assessment - 03/11/20 1139    Subjective Steven states that he almost did not make it today as he had a bowel movement and usually he can not go out after this occurs.  He has been doing his exercises.    Pertinent History prostate cancer; irritable bowel syndrome; carpal tunnel; 2017 fx radius LT    How long can you sit comfortably? sometimes 15 minutes with his pillow other times he can go pretty good but not after a BM, after a BM he has to get off his feet.    How long can you stand comfortably? 6-8 minutes    How long  can you walk comfortably? changes 10-15  minutes on a good day    Patient Stated Goals Steven is having 1-2  bowel movemetns a day when he does he has to lie down for 6-8 hrs a day.                             [place] Adult Steven Treatment/Exercise - 03/11/20 0001      Exercises   Exercises Lumbar      Lumbar Exercises: Stretches   Single Knee to Chest Stretch Right;Left    Single Knee to Chest Stretch Limitations 3 x 30"    Piriformis Stretch Right;Left;3 reps;30 seconds      Lumbar Exercises: Seated   Other Seated Lumbar Exercises Kegal 2 hold/4 rest; Kegal 20 "hold 40" rest x 10       Lumbar Exercises: Supine   Bridge 10 reps    Bridge Limitations hold 5 seconds                     Steven Short Term Goals - 03/02/20 1216      Steven SHORT TERM GOAL #1   Title Steven  pain in rectum after bowel movement to be no greater than a 5/10 to allow Steven to not have to rest any longer than 3 hours    Time 2    Period Weeks    Status New    Target Date 03/16/20             Steven Long Term Goals - 03/02/20 1217      Steven LONG TERM GOAL #1   Title Steven to state that his pain after bowel movement to be no greater than a 3/10  and only have lie down for an hour following a bowel movement .    Time 4    Period Weeks    Status New    Target Date 03/30/20      Steven LONG TERM GOAL #2   Title Steven to be able to sit for 30-40  minutes in comfort to be able to enjoy a meal    Time 4    Period Weeks    Status New      Steven LONG TERM GOAL #3   Title Steven to be able to stand for 15 minutes without increased pain to be able to make a quick meal and complete self grooming tasks.    Time 4    Period Weeks    Status New      Steven LONG TERM GOAL #4   Title Steven to be able to walk for 30 minutes to be able to complete short shopping trips.    Time 4    Period Weeks    Status New                 Plan - 03/11/20 1148    Clinical Impression Statement Added buttock stretching to Steven HEP as well as bridges with good form noted after explaination.  Steven continues to have significant pain following bowel movemets although he was able to come to therapy following BM today.    Examination-Activity Limitations Sit;Locomotion Level;Stand;Toileting    Examination-Participation Restrictions Church;Cleaning;Community Activity;Shop;Yard Work    Merchant navy officer Evolving/Moderate complexity    Rehab Potential Fair    Steven Frequency 1x / week    Steven Duration 4 weeks    Steven Treatment/Interventions Patient/family education;Therapeutic exercise;Manual  techniques    Steven Next Visit Plan begin manual with gun to glutes, progress pelvic floor exercises.    Steven Home Exercise Plan Kegal both fast and slow twitch; single knee to chest, piriformis, double knee to chest, bridge.            Patient will benefit from skilled therapeutic intervention in order to improve the following deficits and impairments:  Pain, Increased muscle spasms, Decreased activity tolerance  Visit Diagnosis: Difficulty in walking, not elsewhere classified  Chronic buttock pain     Problem List Patient Active Problem List   Diagnosis Date Noted  . Perineal pain 03/02/2020  . Subacute thyroiditis 01/07/2020  . Personal history of malignant neoplasm of prostate 12/12/2019  . Benign prostatic hyperplasia with urinary obstruction 12/12/2019  . Weak urinary stream 12/12/2019  . Hyperthyroidism 11/24/2019  . Weight loss 10/21/2019  . IBS (irritable bowel syndrome) 10/21/2019  . Rectal pain 08/21/2019  . Nausea without vomiting 05/30/2019  . Atrial flutter ([place]) 04/17/2019  . Atrial flutter, paroxysmal ([place]) 03/02/2019  . HTN (hypertension) 03/02/2019  . Elevated troponin 03/01/2019  . Hypokalemia 03/01/2019  . Constipation 08/02/2017  . Hemorrhoids 08/02/2017  . Fall 08/18/2015  . Open fracture of left elbow 08/18/2015  . Rib fractures 08/17/2015  . Radiation proctitis 01/30/2013  . Rectal bleeding 12/10/2012  . Chest pain 05/30/2012  . Elevated lipids 05/30/2012  . Palpitations 05/30/2012  . Dyspnea 05/30/2012  . Obese 05/30/2012  . Prostate cancer [facility]) 08/13/2011    Steven [name], Steven [name] [phone] 03/11/2020, 12:30 PM  Steven [name] [address] [place], [place], [address] Phone: [phone]   Fax:  [phone]  Name: JHALEN ELEY MRN: [id number] Date of Birth: [birth year]

## 2020-03-12 ENCOUNTER — Ambulatory Visit: Payer: BC Managed Care – PPO | Admitting: Urology

## 2020-03-14 NOTE — Progress Notes (Signed)
Referring Provider: Jake Samples, PA* Primary Care Physician:  Doree Albee, MD Primary GI Physician: Dr. Gala Romney  Chief Complaint  Patient presents with   Rectal Pain    HPI:   Steven [name] is a 69 y.o. male presenting today with a history of rectal pain, anal fissure failing topical therapy and botox therapy with [facility] [place] [facility] now referred to [facility] with appt scheduled for 9/2. Also with history of constipation, rectal bleeding, weight loss. Colonoscopy was completed 10/23/2019 with external and grade 2 internal hemorrhoids, abnormal rectum blood vessels consistent with radiation proctitis status post APC therapy, otherwise normal.  CT pelvis with contrast 09/25/2019 with no acute findings, stable appearance of prostate brachytherapy since 2017.  CT abdomen with contrast for weight loss 12/03/2019 with questionable mild segmental circumferential wall thickening in lower rectum likely due to underdistention with recent colonoscopy, chronic mild diffuse bladder wall thickening unchanged, no findings to explain weight loss. Diagnosed with subacute thyroiditis following with Dr. Dorris Fetch and started on methimazole.  TSH remains low at 0.301 but T3 and T4 within normal limits on 01/01/2020.  Follow-up with Dr. Dorris Fetch 05/10/2020.  Patient has now established with physical therapy , referred by Dr. Dema Severin with [facility], due to ongoing rectal pain with decreased walking/standing tolerance. Working diagnosis of levator ani syndrome.   Today:  Rectal Pain: Continues with rectal pain and pressure. Can feel pressure in his rectum [name] when he urinates. Rectal pain isn't as bad as it used to be.  Moderate/tolerable rectal pain all day but can worsen after BMs. If standing for long periods, pressure/pain worsens. Has good days and bad days. Has started physical therapy. States he isn't sure if this is helping or going against him. Intermittent spasms.  Primary trouble right now is intermittent sharp  pain and rectal spasms.  No significant burning at this time.  He continues using diltiazem cream and feels this is working well for him.  Reports a small rash at his rectum. Asking if I can take a look.   Constipation: Taking mineral oil at night to help with constipation. BMs 1-2 times a day. Typically soft and formed. MiraLAX once daily, occasionally twice a day. Benefiber once to twice a day. Minimal amount of rectal bleeding yesterday on toilet tissue. Had a small amount of blood on the stool last week in setting of a larger caliber BM. No brbpr this morning. No black stool.   No nausea or vomiting. No GERD symptoms. No dysphagia. Passes a lot of gas in the evening. No pain. Had squash and spinach for dinner. He was eating spinach daily. He is starting to decrease this. Drinking almond milk now.   Eating 3 meals a day. Appetite is good. States his appetite has been good for the last month. Taste has improved.   Weights: 04/08/2019: 223 LBS 08/21/2019: 210 LBS 11/06/2019: 185 LBS 02/19/2020: 167 LBS 03/15/2020: 170 LBS  Past Medical History:  Diagnosis Date   Arthritis    Atrial fibrillation ([facility])    Atrial flutter ([facility])    BPH (benign prostatic hyperplasia)    Chest pain    Dizziness    Dyspnea    laying down occ   Fracture 08/17/2015   MULTIPLE RIB FRACTURES     FROM FALL    GERD (gastroesophageal reflux disease)    Hemorrhoids    History of kidney stones    noted on CT scan   History of radiation therapy 08/22/11-10/13/11   prostate  Hyperlipemia    Hypertension    Hyperthyroidism    Light headedness    Numbness and tingling in left arm    Numbness and tingling of both legs    Prostate cancer [facility])    prostate s/p radiation Mar 2013    Past Surgical History:  Procedure Laterality Date   BOTOX INJECTION N/A 01/14/2020   Procedure: INJECTION OF BOTOX INTO ANAL SPHINCTER;  Surgeon: Ileana Roup, MD;  Location[facility];  Service: General;   Laterality: N/A;   COLONOSCOPY N/A 12/19/2012   [id number] 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 Normal appearing colon   COLONOSCOPY WITH PROPOFOL N/A 10/23/2019   Procedure: COLONOSCOPY WITH PROPOFOL;  Surgeon: Daneil Dolin, MD; external and grade 2 internal hemorrhoids, abnormal rectal blood vessels consistent with radiation proctitis s/p APC therapy, otherwise normal exam.   EVALUATION UNDER ANESTHESIA WITH ANAL FISTULECTOMY N/A 01/14/2020   Procedure: ANORECTAL EXAM UNDER ANESTHESIA;  Surgeon: Ileana Roup, MD;  Location: [facility];  Service: General;  Laterality: N/A;   HOT HEMOSTASIS  10/23/2019   Procedure: HOT HEMOSTASIS (ARGON PLASMA COAGULATION/BICAP);  Surgeon: Daneil Dolin, MD;  [facility];  Service: Endoscopy;;  apc rectal proctitis     KIDNEY SURGERY  1982   kidney tube collapse repair   RADIOACTIVE SEED IMPLANT     Prostate    Current Outpatient Medications  Medication Sig Dispense Refill   alfuzosin (UROXATRAL) 10 MG 24 hr tablet Take 1 tablet (10 mg total) by mouth daily with breakfast. 30 tablet 11   amLODipine (NORVASC) 5 MG tablet Take 5 mg by mouth daily.     Cholecalciferol (VITAMIN D3) 125 MCG (5000 UT) CAPS Take 1 capsule by mouth.      Coenzyme Q10 (COQ10) 100 MG CAPS Take 100 mg by mouth.      Cyanocobalamin (B-12 PO) Take 1 tablet by mouth.      diazepam (VALIUM) 5 MG tablet Take 5 mg by mouth daily as needed for anxiety.      ELIQUIS 5 MG TABS tablet TAKE 1 TABLET(5 MG) BY MOUTH TWICE DAILY 60 tablet 5   gabapentin (NEURONTIN) 300 MG capsule Take 300 mg by mouth 3 (three) times daily.     hydrochlorothiazide (HYDRODIURIL) 25 MG tablet Take 25 mg by mouth daily.     hydrocortisone 2.5 % cream hydrocortisone 2.5 % topical cream     methimazole (TAPAZOLE) 5 MG tablet TAKE ONE TABLET BY MOUTH ONCE DAILY. 90 tablet 0   metoprolol tartrate (LOPRESSOR) 25 MG tablet  Take 1 tablet (25 mg total) by mouth 2 (two) times daily. 60 tablet 2   NEXLETOL 180 MG TABS Take 180 mg by mouth daily.      pantoprazole (PROTONIX) 40 MG tablet TAKE 1 TABLET BY MOUTH DAILY 30 MINUTES BEFORE BREAKFAST 30 tablet 3   potassium chloride SA (KLOR-CON) 20 MEQ tablet Take 20 mEq by mouth 2 (two) times daily.     Probiotic Product (PROBIOTIC-10 PO) Take 1 capsule by mouth daily.     topiramate (TOPAMAX) 100 MG tablet Take 50 mg by mouth at bedtime.      traMADol (ULTRAM) 50 MG tablet tramadol 50 mg tablet  TAKE 1 TABLET BY MOUTH EVERY 6 HOURS AS NEEDED FOR UP TO 5 DAYS FOR SEVERE POSTOP PAIN NOT CONTROLLED WITH TYLENO/IBUPROFEN     VITAMIN E PO Take 1 tablet by mouth.  zinc gluconate 50 MG tablet Take 50 mg by mouth.      pregabalin (LYRICA) 75 MG capsule Take 75 mg by mouth 2 (two) times daily. (Patient not taking: Reported on 03/15/2020)     No current facility-administered medications for this visit.    Allergies as of 03/15/2020   (No Known Allergies)    Family History  Problem Relation Age of Onset   Aneurysm Mother    Hypertension Mother    Prostate cancer Father    Hypertension Father    Colon cancer Neg Hx    Colon polyps Neg Hx     Social History   Socioeconomic History   Marital status: Single    Spouse name: Not on file   Number of children: Not on file   Years of education: Not on file   Highest education level: Not on file  Occupational History   Occupation: Custodian     Employer: [employer]  Tobacco Use   Smoking status: Former Smoker    Packs/day: 1.00    Years: 20.00    Pack years: 20.00    Types: Cigarettes    Quit date: 2020    Years since quitting: 1.6   Smokeless tobacco: Former Systems developer   Tobacco comment: Quit smoking x 2 years    07/2015   SOMETIIMES i USE VAPOR   Vaping Use   Vaping Use: Never used  Substance and Sexual Activity   Alcohol use: Never   Drug use: Never   Sexual activity:  Not Currently  Other Topics Concern   Not on file  Social History Narrative   Divorced   Social Determinants of Health   Financial Resource Strain:    Difficulty of Paying Living Expenses: Not on file  Food Insecurity:    Worried About Charity fundraiser in the Last Year: Not on file   [employer] of Food in the Last Year: Not on file  Transportation Needs:    Lack of Transportation (Medical): Not on file   Lack of Transportation (Non-Medical): Not on file  Physical Activity:    Days of Exercise per Week: Not on file   Minutes of Exercise per Session: Not on file  Stress:    Feeling of Stress : Not on file  Social Connections:    Frequency of Communication with Friends and Family: Not on file   Frequency of Social Gatherings with Friends and Family: Not on file   Attends Religious Services: Not on file   Active Member of Clubs or Organizations: Not on file   Attends Archivist Meetings: Not on file   Marital Status: Not on file    Review of Systems: Gen: Denies fever, chills, cold or flulike symptoms, lightheadedness, dizziness, presyncope, syncope. CV: Denies chest pain or palpitations Resp: Denies dyspnea or cough.  GI:  See HPI Heme: See HPI  Physical Exam: BP 113/68    Pulse (!) 54    Temp (!) 97.3 F (36.3 C) (Temporal)    Ht 5\' 6"  (1.676 m)    Wt 170 lb 6.4 oz (77.3 kg)    BMI 27.50 kg/m  General:   Alert and oriented. No distress noted. Pleasant and cooperative.  Head:  Normocephalic and atraumatic. Eyes:  Conjuctiva clear without scleral icterus. Heart:  S1, S2 present without murmurs appreciated. Lungs:  Clear to auscultation bilaterally. No wheezes, rales, or rhonchi. No distress.  Abdomen:  +BS, soft, non-tender and non-distended. No rebound or guarding. No  HSM or masses noted. Rectal: External exam only. Appears patient has an ingrown hair/folliculitis just behind the anus with mild erythema.  No drainage.  No significant induration or  warmth.  Does not seem consistent with an abscess.  Mild tenderness. Msk:  Symmetrical without gross deformities. Normal posture. Extremities:  Without edema.  Neurologic:  Alert and  oriented x4 Psych:  Normal mood and affect.

## 2020-03-15 ENCOUNTER — Encounter: Payer: Self-pay | Admitting: Gastroenterology

## 2020-03-15 ENCOUNTER — Ambulatory Visit (INDEPENDENT_AMBULATORY_CARE_PROVIDER_SITE_OTHER): Payer: BC Managed Care – PPO | Admitting: Gastroenterology

## 2020-03-15 ENCOUNTER — Other Ambulatory Visit: Payer: Self-pay

## 2020-03-15 VITALS — BP 113/68 | HR 54 | Temp 97.3°F | Ht 66.0 in | Wt 170.4 lb

## 2020-03-15 DIAGNOSIS — R634 Abnormal weight loss: Secondary | ICD-10-CM | POA: Diagnosis not present

## 2020-03-15 DIAGNOSIS — K5900 Constipation, unspecified: Secondary | ICD-10-CM | POA: Diagnosis not present

## 2020-03-15 DIAGNOSIS — K625 Hemorrhage of anus and rectum: Secondary | ICD-10-CM | POA: Diagnosis not present

## 2020-03-15 DIAGNOSIS — K6289 Other specified diseases of anus and rectum: Secondary | ICD-10-CM | POA: Diagnosis not present

## 2020-03-15 NOTE — Patient Instructions (Addendum)
Keep your upcoming appointment with [facility].  You are scheduled on 9/2 at [facility].  You can call to verify time and place at [phone]  It looks like you may have a small ingrown hair just behind your anus.  I recommend sitz bath's 3 times daily. Be sure to keep the area clean and dry. I recommend you limit the amount of rectal cream you are using daily.  You should only be using a very small pea-sized amount and applying it directly to your rectum.  Please only use one type of cream.  As you are currently using diltiazem cream and feel this is working fairly well, you may continue this twice daily.  Do not use nitroglycerin cream with diltiazem.  Continue MiraLAX 17 g in 8 ounces of water daily to twice daily.  Continue Benefiber daily to twice daily.  We will plan to follow-up with you in the office in 4 months.  [name], PA-C [facility]

## 2020-03-16 ENCOUNTER — Encounter: Payer: Self-pay | Admitting: Gastroenterology

## 2020-03-16 NOTE — Assessment & Plan Note (Addendum)
History of intermittent bright red blood per rectum likely multifactorial in the setting of hemorrhoids, radiation proctitis, and anal fissure.  Colonoscopy up-to-date in April 2021 s/p APC therapy for radiation proctitis.  He continues to note intermittent low-volume toilet tissue hematochezia if he has larger caliber stools.  No melena.  He continues with chronic rectal pain failing topical therapy and botox therapy. Currently scheduled to see [facility] on 9/2.  He is using diltiazem cream and feels this is working fairly well for his rectal pain. Of note, appears patient has an ingrown hair/folliculitis with mild erythema and tenderness just behind anus. This could drain and also contribute to toilet tissue hematochezia.   Plan: Continue to monitor rectal bleeding. Continue current bowel regimen with MiraLAX daily to twice daily, Benefiber daily to twice daily, and mineral oil as this seems to be working well for constipation. Continue using diltiazem cream for now as this is controlling his rectal pain fairly well. Sitz baths TID.  Keep follow-up with [facility]. Follow-up in 4 months.  Call with questions or concerns prior.

## 2020-03-16 NOTE — Assessment & Plan Note (Addendum)
Chronic history of rectal pain in setting of hemorrhoids and anal fissure.  He has failed topical therapy as well as Botox therapy with [place] surgery, now referred to [facility] with appointment scheduled for 9/2.  He is also currently enrolled in physical therapy due to persistent rectal pain with working diagnosis of levator ani syndrome.  He continues with intermittent sharp pain and rectal spasms with no significant burning.  Currently using diltiazem which he feels is working the best for him right now.  He also reports a rash.  On external exam today, it appears patient has an ingrown hair/folliculitis with mild erythema.  No findings to suggest abscess.  No drainage on exam today.   Plan: Keep upcoming appointment with [facility]. Continue using diltiazem cream.  Advised he minimize the amount of cream that he is using as he had quite a bit of cream on his exam today.  Recommended pea-sized amount. Otherwise, keep area of ingrown hair clean and dry.  Sitz baths TID.  Follow-up in 4 months.

## 2020-03-16 NOTE — Assessment & Plan Note (Signed)
Chronic.  Current bowel regimen of MiraLAX daily to twice daily, Benefiber daily to twice daily, and mineral oil at night seems to be controlling constipation well.  Intermittent low-volume toilet tissue hematochezia if he has larger caliber stools likely multifactorial in the setting of internal hemorrhoids, history of radiation proctitis, and history of anal fissure.  Colonoscopy up-to-date in April 2021.  Plan: Continue current bowel regimen. Monitor rectal bleeding. Follow-up in 4 months.

## 2020-03-16 NOTE — Assessment & Plan Note (Signed)
Patient has had 50 pound weight loss over the last year.  Weight loss is likely multifactorial.  Patient has been restricting his intake and was not eating solid foods due to concerns of IBS.  Stated he did not know what he should eat.  He was also diagnosed with hyperthyroidism/subacute thyroiditis following with Dr. Dorris Fetch.  He was started on methimazole.  TSH remains low at 0.301 in June 2021.  GI evaluation with colonoscopy up-to-date in April 2021 without polyps or masses.  CT abdomen and CT pelvis with no findings to explain weight loss.  Per patient, reports his appetite is good and he is eating well. This has picked up over the last month. Appears he has gained 3 lbs in the last month. No significant upper GI symptoms.   As weight is starting to improve, we will continue to monitor.  Hold off on EGD.  If weight does not continue to improve, we will need to evaluate his upper GI tract with an EGD to rule out occult processes.

## 2020-03-18 ENCOUNTER — Telehealth: Payer: Self-pay | Admitting: Internal Medicine

## 2020-03-18 NOTE — Telephone Encounter (Signed)
Pt returned call. Pt is having some burning and stinging at his rectum where he puts his topical ointment. Pt also has some burning when his bowel movements are ending with spasms.  Pt says he has been taking a teaspoon of mineral oil daily to help his bowels. Pt researched on goggle about pen worms. Pt wants to know if he is having soreness due to possible pen worms? Pt has been doing warm water soaks and hasn't been putting anything in his water to soak with like he use too. Pt says if he needs another apt, he will.

## 2020-03-18 NOTE — Telephone Encounter (Signed)
I doubt patient has pinworms. His burning and itching are likely secondary to known hemorrhoids. Has he seen anything in his stool that look like worms? If not, we do not need to check his stool. He should continue using his cream and follow-up with [facility] as scheduled.

## 2020-03-18 NOTE — Telephone Encounter (Signed)
Lmom, waiting on a return call.  

## 2020-03-18 NOTE — Telephone Encounter (Signed)
Patient called asking to speak with [name], PA. He said it was very important that he speaks with her regarding his rectum issues. He was seen in the office recently. [phone]

## 2020-03-19 ENCOUNTER — Ambulatory Visit: Payer: BC Managed Care – PPO | Admitting: Urology

## 2020-03-23 ENCOUNTER — Ambulatory Visit (HOSPITAL_COMMUNITY): Payer: BC Managed Care – PPO

## 2020-03-24 ENCOUNTER — Ambulatory Visit (HOSPITAL_COMMUNITY): Payer: BC Managed Care – PPO | Attending: Surgery | Admitting: Physical Therapy

## 2020-03-24 ENCOUNTER — Encounter (HOSPITAL_COMMUNITY): Payer: Self-pay | Admitting: Physical Therapy

## 2020-03-24 ENCOUNTER — Other Ambulatory Visit: Payer: Self-pay

## 2020-03-24 DIAGNOSIS — R262 Difficulty in walking, not elsewhere classified: Secondary | ICD-10-CM | POA: Diagnosis not present

## 2020-03-24 DIAGNOSIS — G8929 Other chronic pain: Secondary | ICD-10-CM | POA: Insufficient documentation

## 2020-03-24 DIAGNOSIS — M7918 Myalgia, other site: Secondary | ICD-10-CM | POA: Diagnosis present

## 2020-03-24 NOTE — Therapy (Signed)
[facility] [place], [place], [address] Phone: [phone]   Fax:  [phone]  Physical Therapy Treatment  Patient Details  Name: Steven [name] MRN: [id number] Date of Birth: 09/26/1950 Referring Provider (PT): Nadeen Landau   Encounter Date: 03/24/2020   PT End of Session - 03/24/20 1429    Visit Number 3    Number of Visits 4    Date for PT Re-Evaluation 04/01/20    Authorization Type BC[employer]    Authorization - Number of Visits 3    Progress Note Due on Visit 4    PT Start Time 3149   pt 91' late   PT Stop Time 1445    PT Time Calculation (min) 30 min    Activity Tolerance Patient tolerated treatment well           Past Medical History:  Diagnosis Date  . Arthritis   . Atrial fibrillation ([place])   . Atrial flutter ([place])   . BPH (benign prostatic hyperplasia)   . Chest pain   . Dizziness   . Dyspnea    laying down occ  . Fracture 08/17/2015   MULTIPLE RIB FRACTURES     FROM FALL   . GERD (gastroesophageal reflux disease)   . Hemorrhoids   . History of kidney stones    noted on CT scan  . History of radiation therapy 08/22/11-10/13/11   prostate  . Hyperlipemia   . Hypertension   . Hyperthyroidism   . Light headedness   . Numbness and tingling in left arm   . Numbness and tingling of both legs   . Prostate cancer [facility])    prostate s/p radiation Mar 2013    Past Surgical History:  Procedure Laterality Date  . BOTOX INJECTION N/A 01/14/2020   Procedure: INJECTION OF BOTOX INTO ANAL SPHINCTER;  Surgeon: Ileana Roup, MD;  Location: [facility];  Service: General;  Laterality: N/A;  . COLONOSCOPY N/A 12/19/2012   [id number] 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 Normal appearing colon  . COLONOSCOPY WITH PROPOFOL N/A 10/23/2019   Procedure: COLONOSCOPY WITH PROPOFOL;  Surgeon: Daneil Dolin, MD; external and grade 2 internal hemorrhoids,  abnormal rectal blood vessels consistent with radiation proctitis s/p APC therapy, otherwise normal exam.  . EVALUATION UNDER ANESTHESIA WITH ANAL FISTULECTOMY N/A 01/14/2020   Procedure: ANORECTAL EXAM UNDER ANESTHESIA;  Surgeon: Ileana Roup, MD;  Location: [facility];  Service: General;  Laterality: N/A;  . HOT HEMOSTASIS  10/23/2019   Procedure: HOT HEMOSTASIS (ARGON PLASMA COAGULATION/BICAP);  Surgeon: Daneil Dolin, MD;  [facility];  Service: Endoscopy;;  apc rectal proctitis    . KIDNEY SURGERY  1982   kidney tube collapse repair  . RADIOACTIVE SEED IMPLANT     Prostate    There were no vitals filed for this visit.   Subjective Assessment - 03/24/20 1424    Subjective PT states that he feels that his problem is scar tissue.  He is going to see a specialist at [facility].  States that he is moving around a little bit more.    Pertinent History prostate cancer; irritable bowel syndrome; carpal tunnel; 2017 fx radius LT    How long can you sit comfortably? sometimes 15 minutes with his pillow other times he can go pretty good but not after a BM, after a BM he has to get off his feet.  How long can you stand comfortably? 15-20 minuest was 6-8 minutes    How long can you walk comfortably? one day he walked 25 minutes was  10-15  minutes on a good day    Currently in Pain? Yes    Pain Score 4     Pain Location Buttocks    Pain Descriptors / Indicators Aching    Pain Type Chronic pain    Pain Onset More than a month ago    Pain Frequency Intermittent    Aggravating Factors  bowel movement    Pain Relieving Factors lying down    Effect of Pain on Daily Activities limits                             [facility] Adult PT Treatment/Exercise - 03/24/20 0001      Exercises   Exercises Lumbar      Lumbar Exercises: Standing   Shoulder Extension Strengthening;10 reps;Theraband    Shoulder Extension Limitations RT arm to Lt hip; Lt arm to Rt hip       Lumbar Exercises: Sidelying   Hip Abduction 10 reps      Lumbar Exercises: Prone   Straight Leg Raise 10 reps                    PT Short Term Goals - 03/24/20 1437      PT SHORT TERM GOAL #1   Title Pt pain in rectum after bowel movement to be no greater than a 5/10 to allow pt to not have to rest any longer than 3 hours    Baseline an hour now    Time 2    Period Weeks    Status Achieved    Target Date 03/16/20             PT Long Term Goals - 03/24/20 1438      PT LONG TERM GOAL #1   Title Pt to state that his pain after bowel movement to be no greater than a 3/10  and only have lie down for an hour following a bowel movement .    Time 4    Period Weeks    Status Achieved      PT LONG TERM GOAL #2   Title PT to be able to sit for 30-40  minutes in comfort to be able to enjoy a meal    Baseline depends on the chair can sit up to an hour    Time 4    Period Weeks    Status Achieved      PT LONG TERM GOAL #3   Title Pt to be able to stand for 15 minutes without increased pain to be able to make a quick meal and complete self grooming tasks.    Time 4    Period Weeks    Status Achieved      PT LONG TERM GOAL #4   Title PT to be able to walk for 30 minutes to be able to complete short shopping trips.    Time 4    Period Weeks    Status On-going                 Plan - 03/24/20 1430    Clinical Impression Statement Pt instructed in Tband exercises; sidelying abduction and prone extension;    Examination-Activity Limitations Sit;Locomotion Level;Stand;Toileting    Examination-Participation Restrictions Church;Cleaning;Community Activity;Shop;Yard Work  Stability/Clinical Decision Making Evolving/Moderate complexity    Clinical Decision Making Moderate    Rehab Potential Fair    PT Frequency 1x / week    PT Duration 4 weeks    PT Treatment/Interventions Patient/family education;Therapeutic exercise;Manual techniques    PT Next Visit Plan  progress pelvic floor exercises.    PT Home Exercise Plan [place] both fast and slow twitch; single knee to chest, piriformis, double knee to chest, bridge.; tband exercises; sidelying abduction and prone hip extension.           Patient will benefit from skilled therapeutic intervention in order to improve the following deficits and impairments:  Pain, Increased muscle spasms, Decreased activity tolerance  Visit Diagnosis: Difficulty in walking, not elsewhere classified  Chronic buttock pain     Problem List Patient Active Problem List   Diagnosis Date Noted  . Perineal pain 03/02/2020  . Subacute thyroiditis 01/07/2020  . Personal history of malignant neoplasm of prostate 12/12/2019  . Benign prostatic hyperplasia with urinary obstruction 12/12/2019  . Weak urinary stream 12/12/2019  . Hyperthyroidism 11/24/2019  . Weight loss 10/21/2019  . IBS (irritable bowel syndrome) 10/21/2019  . Rectal pain 08/21/2019  . Nausea without vomiting 05/30/2019  . Atrial flutter ([place]) 04/17/2019  . Atrial flutter, paroxysmal ([place]) 03/02/2019  . HTN (hypertension) 03/02/2019  . Elevated troponin 03/01/2019  . Hypokalemia 03/01/2019  . Constipation 08/02/2017  . Hemorrhoids 08/02/2017  . Fall 08/18/2015  . Open fracture of left elbow 08/18/2015  . Rib fractures 08/17/2015  . Radiation proctitis 01/30/2013  . Rectal bleeding 12/10/2012  . Chest pain 05/30/2012  . Elevated lipids 05/30/2012  . Palpitations 05/30/2012  . Dyspnea 05/30/2012  . Obese 05/30/2012  . Prostate cancer [facility]) 08/13/2011    Rayetta Humphrey, PT CLT [phone] 03/24/2020, 2:44 PM  [facility] [address] [place], [place], [address] Phone: [phone]   Fax:  [phone]  Name: Steven [name] MRN: [id number] Date of Birth: 1951/02/19

## 2020-03-31 ENCOUNTER — Ambulatory Visit (HOSPITAL_COMMUNITY): Payer: BC Managed Care – PPO | Admitting: Physical Therapy

## 2020-03-31 ENCOUNTER — Encounter (HOSPITAL_COMMUNITY): Payer: Self-pay | Admitting: Physical Therapy

## 2020-03-31 ENCOUNTER — Ambulatory Visit: Payer: BC Managed Care – PPO | Admitting: Urology

## 2020-03-31 ENCOUNTER — Ambulatory Visit (INDEPENDENT_AMBULATORY_CARE_PROVIDER_SITE_OTHER): Payer: BC Managed Care – PPO | Admitting: Urology

## 2020-03-31 ENCOUNTER — Encounter: Payer: Self-pay | Admitting: Urology

## 2020-03-31 ENCOUNTER — Other Ambulatory Visit: Payer: Self-pay

## 2020-03-31 ENCOUNTER — Encounter (HOSPITAL_COMMUNITY): Payer: Self-pay

## 2020-03-31 VITALS — BP 114/71 | HR 56 | Temp 97.7°F | Ht 66.0 in | Wt 170.4 lb

## 2020-03-31 DIAGNOSIS — N138 Other obstructive and reflux uropathy: Secondary | ICD-10-CM | POA: Diagnosis not present

## 2020-03-31 DIAGNOSIS — N401 Enlarged prostate with lower urinary tract symptoms: Secondary | ICD-10-CM | POA: Diagnosis not present

## 2020-03-31 DIAGNOSIS — N411 Chronic prostatitis: Secondary | ICD-10-CM | POA: Diagnosis not present

## 2020-03-31 LAB — URINALYSIS, ROUTINE W REFLEX MICROSCOPIC
Bilirubin, UA: NEGATIVE
Glucose, UA: NEGATIVE
Ketones, UA: NEGATIVE
Leukocytes,UA: NEGATIVE
Nitrite, UA: NEGATIVE
Protein,UA: NEGATIVE
RBC, UA: NEGATIVE
Specific Gravity, UA: <1.005 — ABNORMAL LOW (ref 1.005–1.030)
Urobilinogen, Ur: 0.2 mg/dL (ref 0.2–1.0)
pH, UA: 6.5 (ref 5.0–7.5)

## 2020-03-31 MED ORDER — ALFUZOSIN HCL ER 10 MG PO TB24: 10.0000 mg | ORAL_TABLET | Freq: Every day | ORAL | 3 refills | Status: DC

## 2020-03-31 NOTE — Patient Instructions (Signed)
Prostatitis  Prostatitis is swelling of the prostate gland. The prostate helps to make semen. It is below a man's bladder, in front of the rectum. There are different types of prostatitis. Follow these instructions at home:   Take over-the-counter and prescription medicines only as told by your doctor.  If you were prescribed an antibiotic medicine, take it as told by your doctor. Do not stop taking the antibiotic even if you start to feel better.  If your doctor prescribed exercises, do them as directed.  Take sitz baths as told by your doctor. To take a sitz bath, sit in warm water that is deep enough to cover your hips and butt.  Keep all follow-up visits as told by your doctor. This is important. Contact a doctor if:  Your symptoms get worse.  You have a fever. Get help right away if:  You have chills.  You feel sick to your stomach (nauseous).  You throw up (vomit).  You feel light-headed.  You feel like you might pass out (faint).  You cannot pee (urinate).  You have blood or clumps of blood (blood clots) in your pee (urine). This information is not intended to replace advice given to you by your health care provider. Make sure you discuss any questions you have with your health care provider. Document Revised: 06/22/2017 Document Reviewed: 03/30/2016 Elsevier Patient Education  2020 Elsevier Inc.  

## 2020-03-31 NOTE — Progress Notes (Signed)
Urological Symptom Review  Patient is experiencing the following symptoms: none   Review of Systems  Gastrointestinal (upper)  : Negative for upper GI symptoms  Gastrointestinal (lower) : Constipation  Constitutional : Weight loss  Skin: Negative for skin symptoms  Eyes: Negative for eye symptoms  Ear/Nose/Throat : Negative for Ear/Nose/Throat symptoms  Hematologic/Lymphatic: Negative for Hematologic/Lymphatic symptoms  Cardiovascular : Negative for cardiovascular symptoms  Respiratory : Negative for respiratory symptoms  Endocrine: Negative for endocrine symptoms  Musculoskeletal: Negative for musculoskeletal symptoms  Neurological: Negative for neurological symptoms  Psychologic: Negative for psychiatric symptoms

## 2020-03-31 NOTE — Progress Notes (Signed)
03/31/2020 11:13 AM   Steven [name] Aug 18, 1950 [id number]  Referring provider: Jake Samples, PA-C [address] [place],  [place] [address]  Chronic prostatitis  HPI: Steven [name] is a 69yo here for followup for chronic prostatitis. Last visit he was started on uroxatral 10mg . He notes improvement in his stream, less urgency and frequency, nocturia 0-1x. The perineal pain has improved since last visit. Dysuria is rare. He is seeing a physician at [facility] for his anal fissure.    PMH: Past Medical History:  Diagnosis Date  . Arthritis   . Atrial fibrillation ([place])   . Atrial flutter ([place])   . BPH (benign prostatic hyperplasia)   . Chest pain   . Dizziness   . Dyspnea    laying down occ  . Fracture 08/17/2015   MULTIPLE RIB FRACTURES     FROM FALL   . GERD (gastroesophageal reflux disease)   . Hemorrhoids   . History of kidney stones    noted on CT scan  . History of radiation therapy 08/22/11-10/13/11   prostate  . Hyperlipemia   . Hypertension   . Hyperthyroidism   . Light headedness   . Numbness and tingling in left arm   . Numbness and tingling of both legs   . Prostate cancer [facility])    prostate s/p radiation Mar 2013    Surgical History: Past Surgical History:  Procedure Laterality Date  . BOTOX INJECTION N/A 01/14/2020   Procedure: INJECTION OF BOTOX INTO ANAL SPHINCTER;  Surgeon: Steven Roup, MD;  [facility];  Service: General;  Laterality: N/A;  . COLONOSCOPY N/A 12/19/2012   [id number] 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 Normal appearing colon  . COLONOSCOPY WITH PROPOFOL N/A 10/23/2019   Procedure: COLONOSCOPY WITH PROPOFOL;  Surgeon: Steven Dolin, MD; external and grade 2 internal hemorrhoids, abnormal rectal blood vessels consistent with radiation proctitis s/p APC therapy, otherwise normal exam.  . EVALUATION UNDER ANESTHESIA WITH ANAL FISTULECTOMY N/A 01/14/2020     Procedure: ANORECTAL EXAM UNDER ANESTHESIA;  Surgeon: Steven Roup, MD;  [facility];  Service: General;  Laterality: N/A;  . HOT HEMOSTASIS  10/23/2019   Procedure: HOT HEMOSTASIS (ARGON PLASMA COAGULATION/BICAP);  Surgeon: Steven Dolin, MD;  [facility];  Service: Endoscopy;;  apc rectal proctitis    . KIDNEY SURGERY  1982   kidney tube collapse repair  . RADIOACTIVE SEED IMPLANT     Prostate    Home Medications:  Allergies as of 03/31/2020      Reactions   Latex Rash   Possible reaction to latex gloves per patient      Medication List       Accurate as of March 31, 2020 11:13 AM. If you have any questions, ask your nurse or doctor.        alfuzosin 10 MG 24 hr tablet Commonly known as: UROXATRAL Take 1 tablet (10 mg total) by mouth daily with breakfast.   amLODipine 5 MG tablet Commonly known as: NORVASC Take 5 mg by mouth daily.   B-12 PO Take 1 tablet by mouth.   CoQ10 100 MG Caps Take 100 mg by mouth.   diazepam 5 MG tablet Commonly known as: VALIUM Take 5 mg by mouth daily as needed for anxiety.   diclofenac 50 MG tablet Commonly known as: CATAFLAM 2 times daily.   Eliquis 5 MG Tabs tablet Generic drug: apixaban TAKE 1  TABLET(5 MG) BY MOUTH TWICE DAILY   gabapentin 300 MG capsule Commonly known as: NEURONTIN Take 300 mg by mouth 3 (three) times daily.   gabapentin 600 MG tablet Commonly known as: NEURONTIN Take 600 mg by mouth 3 (three) times daily.   hydrochlorothiazide 25 MG tablet Commonly known as: HYDRODIURIL Take 25 mg by mouth daily.   hydrocortisone 2.5 % cream hydrocortisone 2.5 % topical cream   hyoscyamine 0.125 MG SL tablet Commonly known as: LEVSIN SL Place under the tongue every 4 (four) hours.   hyoscyamine 0.375 MG 12 hr tablet Commonly known as: LEVBID Take by mouth.   losartan 50 MG tablet Commonly known as: COZAAR Take 50 mg by mouth daily.   methimazole 5 MG tablet Commonly known as:  TAPAZOLE TAKE ONE TABLET BY MOUTH ONCE DAILY.   metoprolol tartrate 25 MG tablet Commonly known as: LOPRESSOR Take 1 tablet (25 mg total) by mouth 2 (two) times daily.   Nexletol 180 MG Tabs Generic drug: Bempedoic Acid Take 180 mg by mouth daily.   pantoprazole 40 MG tablet Commonly known as: PROTONIX TAKE 1 TABLET BY MOUTH DAILY 30 MINUTES BEFORE BREAKFAST   polyethylene glycol powder 17 GM/SCOOP powder Commonly known as: GLYCOLAX/MIRALAX Take by mouth.   potassium chloride SA 20 MEQ tablet Commonly known as: KLOR-CON Take 20 mEq by mouth 2 (two) times daily.   pregabalin 75 MG capsule Commonly known as: LYRICA Take 75 mg by mouth 2 (two) times daily.   PROBIOTIC-10 PO Take 1 capsule by mouth daily.   tamsulosin 0.4 MG Caps capsule Commonly known as: FLOMAX Take 0.4 mg by mouth daily.   topiramate 100 MG tablet Commonly known as: TOPAMAX Take 50 mg by mouth at bedtime.   traMADol 50 MG tablet Commonly known as: ULTRAM tramadol 50 mg tablet  TAKE 1 TABLET BY MOUTH EVERY 6 HOURS AS NEEDED FOR UP TO 5 DAYS FOR SEVERE POSTOP PAIN NOT CONTROLLED WITH TYLENO/IBUPROFEN   Vitamin D3 125 MCG (5000 UT) Caps Take 1 capsule by mouth.   VITAMIN E PO Take 1 tablet by mouth.   zinc gluconate 50 MG tablet Take 50 mg by mouth.       Allergies:  Allergies  Allergen Reactions  . Latex Rash    Possible reaction to latex gloves per patient    Family History: Family History  Problem Relation Age of Onset  . Aneurysm Mother   . Hypertension Mother   . Prostate cancer Father   . Hypertension Father   . Colon cancer Neg Hx   . Colon polyps Neg Hx     Social History:  reports that he quit smoking about 20 months ago. His smoking use included cigarettes. He has a 20.00 pack-year smoking history. He has quit using smokeless tobacco. He reports that he does not drink alcohol and does not use drugs.  ROS: All other review of systems were reviewed and are negative  except what is noted above in HPI  Physical Exam: Ht 5\' 6"  (1.676 m)   Wt 170 lb 6.4 oz (77.3 kg)   BMI 27.50 kg/m   Constitutional:  Alert and oriented, No acute distress. HEENT: [place] AT, moist mucus membranes.  Trachea midline, no masses. Cardiovascular: No clubbing, cyanosis, or edema. Respiratory: Normal respiratory effort, no increased work of breathing. GI: Abdomen is soft, nontender, nondistended, no abdominal masses GU: No CVA tenderness.  Lymph: No cervical or inguinal lymphadenopathy. Skin: No rashes, bruises or suspicious lesions. Neurologic: Grossly intact,  no focal deficits, moving all 4 extremities. Psychiatric: Normal mood and affect.  Laboratory Data: Lab Results  Component Value Date   WBC 3.5 (L) 02/19/2020   HGB 13.6 02/19/2020   HCT 40.0 02/19/2020   MCV 90.3 02/19/2020   PLT 250 02/19/2020    Lab Results  Component Value Date   CREATININE 0.98 02/19/2020    Lab Results  Component Value Date   PSA 1.8 12/10/2019   PSA 2.3 11/10/2019    No results found for: TESTOSTERONE  No results found for: HGBA1C  Urinalysis    Component Value Date/Time   COLORURINE YELLOW 01/22/2020 0957   APPEARANCEUR Clear 03/02/2020 1443   LABSPEC 1.008 01/22/2020 0957   PHURINE 6.5 01/22/2020 0957   GLUCOSEU Negative 03/02/2020 1443   HGBUR NEGATIVE 01/22/2020 0957   BILIRUBINUR Negative 03/02/2020 1443   KETONESUR NEGATIVE 01/22/2020 0957   PROTEINUR Negative 03/02/2020 1443   PROTEINUR NEGATIVE 01/22/2020 0957   UROBILINOGEN negative (A) 12/12/2019 1113   UROBILINOGEN 1.0 08/03/2012 1156   NITRITE Negative 03/02/2020 1443   NITRITE NEGATIVE 01/22/2020 0957   LEUKOCYTESUR Negative 03/02/2020 1443   LEUKOCYTESUR NEGATIVE 01/22/2020 0957    Lab Results  Component Value Date   LABMICR See below: 03/02/2020   WBCUA None seen 03/02/2020   LABEPIT None seen 03/02/2020   BACTERIA None seen 03/02/2020    Pertinent Imaging:  Results for orders placed during  the hospital encounter of 11/07/19  DG Abdomen 1 View  Narrative CLINICAL DATA:  Constipation. Hemorrhoids. Irritable bowel syndrome. Prostate cancer.  EXAM: ABDOMEN - 1 VIEW  COMPARISON:  CT pelvis 09/25/2019 in CT abdomen 04/29/2019  FINDINGS: Oval-shaped densities along the midline of the upper abdomen, possibly in the stomach. The patient had gallstones on the 04/29/2019 exam but these densities appear more regular and more medially located than expected location for gallstones. There is another oval-shaped density projecting over the transverse colon and 1 along the rectum, again I suspect that these are within stomach/bowel.  The tiny punctate right kidney lower pole renal calculus shown on 04/29/2019 is not readily seen on today's conventional radiographs.  Dextroconvex lumbar scoliosis with rotary component. No dilated bowel. Bowel gas pattern appears otherwise unremarkable.  IMPRESSION: 1. Oval-shaped densities in the abdomen are thought to be in the stomach and bowel. 2. No dilated bowel.  Unremarkable bowel gas pattern. 3. Dextroconvex lumbar scoliosis with rotary component.   Electronically Signed By: Van Clines M.D. On: 11/07/2019 17:35  No results found for this or any previous visit.  No results found for this or any previous visit.  No results found for this or any previous visit.  No results found for this or any previous visit.  No results found for this or any previous visit.  No results found for this or any previous visit.  No results found for this or any previous visit.   Assessment & Plan:    1. Chronic prostatitis -We will continue uroxatral 10mg   -RTC 3 months    No follow-ups on file.  Nicolette Bang, MD  [facility]

## 2020-04-02 NOTE — Therapy (Signed)
[facility] [place], [place], [address] Phone: [phone]   Fax:  [phone]  Patient Details  Name: Steven [name] MRN: [id number] Date of Birth: [birth year] Referring Provider:  Jake Samples, PA*  Encounter Date: 03/31/2020    Pt arrived but stated he had to leave prior to any therapy starting due to GI issues.  Rayetta Humphrey, PT CLT [phone] 04/02/2020, 10:24 AM  [facility] [address] [place], [place], [address] Phone: [phone]   Fax:  [phone]

## 2020-04-05 ENCOUNTER — Ambulatory Visit: Payer: BC Managed Care – PPO | Admitting: Urology

## 2020-04-06 ENCOUNTER — Other Ambulatory Visit: Payer: Self-pay

## 2020-04-06 ENCOUNTER — Ambulatory Visit (HOSPITAL_COMMUNITY)
Admission: RE | Admit: 2020-04-06 | Discharge: 2020-04-06 | Disposition: A | Discharge status: Home / Self Care | Payer: BC Managed Care – PPO | Source: Ambulatory Visit | Attending: Neurosurgery | Admitting: Neurosurgery

## 2020-04-06 DIAGNOSIS — M5412 Radiculopathy, cervical region: Secondary | ICD-10-CM | POA: Insufficient documentation

## 2020-04-07 ENCOUNTER — Ambulatory Visit (HOSPITAL_COMMUNITY): Payer: BC Managed Care – PPO | Admitting: Physical Therapy

## 2020-04-07 DIAGNOSIS — M7918 Myalgia, other site: Secondary | ICD-10-CM

## 2020-04-07 DIAGNOSIS — R262 Difficulty in walking, not elsewhere classified: Secondary | ICD-10-CM

## 2020-04-07 NOTE — Therapy (Signed)
[place], [place], [address] Phone: [phone]   Fax:  [phone]  Physical Therapy Treatment  Patient Details  Name: Steven [name] MRN: [id number] Date of Birth: 06-07-1951 Referring Provider (PT): Nadeen Landau  PHYSICAL THERAPY DISCHARGE SUMMARY  Visits from Start of Care: 4  Current functional level related to goals / functional outcomes: I with exercises all goals met    Remaining deficits: Still fatigued for an hour following BM   Education / Equipment: HEP Plan: Patient agrees to discharge.  Patient goals were met. Patient is being discharged due to meeting the stated rehab goals.  ?????     Encounter Date: 04/07/2020   PT End of Session - 04/07/20 1211    Visit Number 4    Number of Visits 4    Date for PT Re-Evaluation 04/01/20    Authorization Type BCBS    Authorization - Visit Number 4    Authorization - Number of Visits 4    Progress Note Due on Visit 4    PT Start Time 1694    PT Stop Time 1220    PT Time Calculation (min) 45 min    Activity Tolerance Patient tolerated treatment well           Past Medical History:  Diagnosis Date  . Arthritis   . Atrial fibrillation ([place])   . Atrial flutter ([place])   . BPH (benign prostatic hyperplasia)   . Chest pain   . Dizziness   . Dyspnea    laying down occ  . Fracture 08/17/2015   MULTIPLE RIB FRACTURES     FROM FALL   . GERD (gastroesophageal reflux disease)   . Hemorrhoids   . History of kidney stones    noted on CT scan  . History of radiation therapy 08/22/11-10/13/11   prostate  . Hyperlipemia   . Hypertension   . Hyperthyroidism   . Light headedness   . Numbness and tingling in left arm   . Numbness and tingling of both legs   . Prostate cancer [facility])    prostate s/p radiation Mar 2013    Past Surgical History:  Procedure Laterality Date  . BOTOX INJECTION N/A 01/14/2020   Procedure: INJECTION OF BOTOX INTO ANAL  SPHINCTER;  Surgeon: Ileana Roup, MD;  Location: [facility];  Service: General;  Laterality: N/A;  . COLONOSCOPY N/A 12/19/2012   [id number] 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 Normal appearing colon  . COLONOSCOPY WITH PROPOFOL N/A 10/23/2019   Procedure: COLONOSCOPY WITH PROPOFOL;  Surgeon: Daneil Dolin, MD; external and grade 2 internal hemorrhoids, abnormal rectal blood vessels consistent with radiation proctitis s/p APC therapy, otherwise normal exam.  . EVALUATION UNDER ANESTHESIA WITH ANAL FISTULECTOMY N/A 01/14/2020   Procedure: ANORECTAL EXAM UNDER ANESTHESIA;  Surgeon: Ileana Roup, MD;  Location: [facility];  Service: General;  Laterality: N/A;  . HOT HEMOSTASIS  10/23/2019   Procedure: HOT HEMOSTASIS (ARGON PLASMA COAGULATION/BICAP);  Surgeon: Daneil Dolin, MD;  [facility];  Service: Endoscopy;;  apc rectal proctitis    . KIDNEY SURGERY  1982   kidney tube collapse repair  . RADIOACTIVE SEED IMPLANT     Prostate    There were no vitals filed for this visit.   Subjective Assessment - 04/07/20 1146    Subjective Pt states that he has not been doing his exercises.  PT states that  he has an irritaion area above his anus. He has gotten some cream from his MD.  States that he is not near as bad as he was after the bowel movements now he has discomfort for about 1 hour after he has the bowel movemetn.    Pertinent History prostate cancer; irritable bowel syndrome; carpal tunnel; 2017 fx radius LT    How long can you sit comfortably? 30 minutes was  15 minutes with his pillow other times he can go pretty good but not after a BM, after a BM he has to get off his feet.    How long can you stand comfortably? 15-20 minuest was 6-8 minutes    How long can you walk comfortably? one day he walked 25 minutes was  10-15  minutes on a good day    Patient Stated Goals Pt is having 1-2  bowel movemetns a day when  he does he has to lie down for 6-8 hrs a day.    Currently in Pain? --   rectal pain   Pain Score 4     Pain Location Buttocks    Pain Descriptors / Indicators Burning    Pain Type Chronic pain    Pain Onset More than a month ago    Pain Frequency Intermittent                             [facility] Adult PT Treatment/Exercise - 04/07/20 0001      Exercises   Exercises Lumbar      Lumbar Exercises: Stretches   Single Knee to Chest Stretch Right;Left;3 reps;30 seconds    Double Knee to Chest Stretch 3 reps;30 seconds    Piriformis Stretch Right;Left;3 reps      Lumbar Exercises: Standing   Shoulder Extension Strengthening;10 reps;Theraband      Lumbar Exercises: Seated   Other Seated Lumbar Exercises Kegal 2 hold/4 rest; Kegal 20 "hold 40" rest x 10       Lumbar Exercises: Supine   Bridge 10 reps      Lumbar Exercises: Sidelying   Hip Abduction 10 reps      Lumbar Exercises: Prone   Straight Leg Raise 10 reps                    PT Short Term Goals - 03/24/20 1437      PT SHORT TERM GOAL #1   Title Pt pain in rectum after bowel movement to be no greater than a 5/10 to allow pt to not have to rest any longer than 3 hours    Baseline an hour now    Time 2    Period Weeks    Status Achieved    Target Date 03/16/20             PT Long Term Goals - 04/07/20 1151      PT LONG TERM GOAL #1   Title Pt to state that his pain after bowel movement to be no greater than a 3/10  and only have lie down for an hour following a bowel movement .    Time 4    Period Weeks    Status Achieved      PT LONG TERM GOAL #2   Title PT to be able to sit for 30-40  minutes in comfort to be able to enjoy a meal    Baseline depends on the chair can sit up  to an hour    Time 4    Period Weeks    Status Achieved      PT LONG TERM GOAL #3   Title Pt to be able to stand for 15 minutes without increased pain to be able to make a quick meal and complete self  grooming tasks.    Time 4    Period Weeks    Status Achieved      PT LONG TERM GOAL #4   Title PT to be able to walk for 30 minutes to be able to complete short shopping trips.    Time 4    Period Weeks    Status Achieved                 Plan - 04/07/20 1201    Clinical Impression Statement Reviewed all exercises with pt  with not questions.  All goals have been met and pt is I in exercises.  PT request new exercise sheets as he has lost his.  Therapist introduced massage gun to pt and given [place] information if he is interested in purchasing one.    Examination-Activity Limitations Sit;Locomotion Level;Stand;Toileting    Examination-Participation Restrictions Church;Cleaning;Community Activity;Shop;Yard Work    Merchant navy officer Evolving/Moderate complexity    Rehab Potential Fair    PT Frequency 1x / week    PT Duration 4 weeks    PT Treatment/Interventions Patient/family education;Therapeutic exercise;Manual techniques    PT Next Visit Plan Discharge to HEP    PT Home Exercise Plan Kegal both fast and slow twitch; single knee to chest, piriformis, double knee to chest, bridge.; tband exercises; sidelying abduction and prone hip extension.           Patient will benefit from skilled therapeutic intervention in order to improve the following deficits and impairments:  Pain, Increased muscle spasms, Decreased activity tolerance  Visit Diagnosis: Difficulty in walking, not elsewhere classified  Chronic buttock pain     Problem List Patient Active Problem List   Diagnosis Date Noted  . Perineal pain 03/02/2020  . Subacute thyroiditis 01/07/2020  . Personal history of malignant neoplasm of prostate 12/12/2019  . Benign prostatic hyperplasia with urinary obstruction 12/12/2019  . Weak urinary stream 12/12/2019  . Hyperthyroidism 11/24/2019  . Weight loss 10/21/2019  . IBS (irritable bowel syndrome) 10/21/2019  . Rectal pain 08/21/2019  . Nausea  without vomiting 05/30/2019  . Atrial flutter ([place]) 04/17/2019  . Atrial flutter, paroxysmal ([place]) 03/02/2019  . HTN (hypertension) 03/02/2019  . Elevated troponin 03/01/2019  . Hypokalemia 03/01/2019  . Constipation 08/02/2017  . Hemorrhoids 08/02/2017  . Fall 08/18/2015  . Open fracture of left elbow 08/18/2015  . Rib fractures 08/17/2015  . Radiation proctitis 01/30/2013  . Rectal bleeding 12/10/2012  . Chest pain 05/30/2012  . Elevated lipids 05/30/2012  . Palpitations 05/30/2012  . Dyspnea 05/30/2012  . Obese 05/30/2012  . Prostate cancer [facility]) 08/13/2011    Rayetta Humphrey, PT CLT [phone] 04/07/2020, 12:22 PM  [place] [address] [place], [place], [address] Phone: [phone]   Fax:  [phone]  Name: Steven [name] MRN: [id number] Date of Birth: 1951-03-27

## 2020-04-16 ENCOUNTER — Telehealth: Payer: Self-pay

## 2020-04-16 LAB — TSH: TSH: 5.2 (ref 0.41–5.90)

## 2020-04-16 LAB — LIPID PANEL
Cholesterol: 142 (ref 0–200)
HDL: 48 (ref 35–70)
LDL Cholesterol: 79
Triglycerides: 74 (ref 40–160)

## 2020-04-16 LAB — BASIC METABOLIC PANEL
BUN: 8 (ref 4–21)
Creatinine: 1 (ref 0.6–1.3)

## 2020-04-16 NOTE — Telephone Encounter (Signed)
Pt called c/o dizziness and walking difficulty for 2 weeks. Wanted to know if the Alfuzosin could be causing these. Pt said the med is working well for his urinary sx. Because pt said he was attempting to see pcp today and  he has been on the med for several months with it working I advised him to talk with them to rule out any other possible causes then call us back if needed.

## 2020-04-19 ENCOUNTER — Telehealth: Payer: Self-pay | Admitting: Cardiovascular Disease

## 2020-04-19 ENCOUNTER — Other Ambulatory Visit: Payer: Self-pay

## 2020-04-19 ENCOUNTER — Telehealth: Payer: Self-pay | Admitting: "Endocrinology

## 2020-04-19 NOTE — Telephone Encounter (Signed)
States that he has had this chest pain off/on x 1 month.    IBS bothering lately due to eating some chinese food, so just been laying around.    C/O of pain in left leg - states he has had this in the past - feels like may be nerve pain.    States that urologist told him to stop the Alfusozin for a while to see if that helps.    Been on the Gabapentin x 6 weeks.    Starting seeing spine doctor about a week ago.

## 2020-04-19 NOTE — Telephone Encounter (Signed)
Patient came into the office and states he has been feeling weak and light headed off and on and that his primary care doctor did labs on him and states his thyroid is overacting again. Patient is unsure if he needs a medication adjust or not.   [phone]

## 2020-04-19 NOTE — Telephone Encounter (Signed)
New message     Patient called again his family doctor said to continue taking the 300 mg of the Gabapentin for nerve pain   PCP said it might be the alfuzosin (UROXATRAL) 10 MG 24 hr tablet that is causing him weakness and fatigue - because he has only been taking this for a few weak

## 2020-04-19 NOTE — Telephone Encounter (Signed)
He has no documented CAD with low risk myovue December 06/2019 other complaints non cardiac Should start with f/u primary , urology and whoever prescribed his gabapentin

## 2020-04-19 NOTE — Telephone Encounter (Signed)
Left message to return call 

## 2020-04-19 NOTE — Telephone Encounter (Signed)
New message    Pt c/o medication issue:  1. Name of Medication:  gabapentin (NEURONTIN) 300 MG capsule Take 300 mg by mouth 3 (three) times daily.   gabapentin (NEURONTIN) 600 MG tablet    2. How are you currently taking this medication (dosage and times per day)? ? Message was left on voicmail   3. Are you having a reaction (difficulty breathing--STAT)?  Yes - patient stated he was having chest pains , since starting this medication   4. What is your medication issue?  Patient has questions about the symptoms he is having since starting taking this

## 2020-04-20 ENCOUNTER — Telehealth (INDEPENDENT_AMBULATORY_CARE_PROVIDER_SITE_OTHER): Payer: BC Managed Care – PPO | Admitting: "Endocrinology

## 2020-04-20 ENCOUNTER — Encounter: Payer: Self-pay | Admitting: "Endocrinology

## 2020-04-20 VITALS — BP 120/78 | Ht 66.0 in | Wt 170.0 lb

## 2020-04-20 DIAGNOSIS — E0590 Thyrotoxicosis, unspecified without thyrotoxic crisis or storm: Secondary | ICD-10-CM

## 2020-04-20 NOTE — Telephone Encounter (Signed)
I spoke with patient.he has apt with PCP on October 8th and will discuss issues then.

## 2020-04-20 NOTE — Telephone Encounter (Signed)
Pt had labs performed at [place], will get those results.

## 2020-04-20 NOTE — Telephone Encounter (Signed)
Can we work  him on for phone visit today?

## 2020-04-20 NOTE — Progress Notes (Signed)
04/20/2020                                   Endocrinology Telehealth Visit Follow up Note -During COVID -19 Pandemic  This visit type was conducted  via telephone due to national recommendations for restrictions regarding the COVID-19 Pandemic  in an effort to limit this patient's exposure and mitigate transmission of the corona virus.   I connected with Steven [name] on 04/20/2020   by telephone and verified that I am speaking with the correct person using two identifiers. Steven [name], [birth year]. he has verbally consented to this visit.  I was in my office and patient was in his residence. No other persons were with me during the encounter. All issues noted in this document were discussed and addressed. The format was not optimal for physical exam.   Subjective:    Patient ID: Steven [name], male    DOB: 1951/01/11, PCP Jake Samples, PA-C.   Past Medical History:  Diagnosis Date  . Arthritis   . Atrial fibrillation ([place])   . Atrial flutter ([place])   . BPH (benign prostatic hyperplasia)   . Chest pain   . Dizziness   . Dyspnea    laying down occ  . Fracture 08/17/2015   MULTIPLE RIB FRACTURES     FROM FALL   . GERD (gastroesophageal reflux disease)   . Hemorrhoids   . History of kidney stones    noted on CT scan  . History of radiation therapy 08/22/11-10/13/11   prostate  . Hyperlipemia   . Hypertension   . Hyperthyroidism   . Light headedness   . Numbness and tingling in left arm   . Numbness and tingling of both legs   . Prostate cancer [facility])    prostate s/p radiation Mar 2013    Past Surgical History:  Procedure Laterality Date  . BOTOX INJECTION N/A 01/14/2020   Procedure: INJECTION OF BOTOX INTO ANAL SPHINCTER;  Surgeon: Ileana Roup, MD;  [facility];  Service: General;  Laterality: N/A;  . COLONOSCOPY N/A 12/19/2012   [id number] 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  Normal appearing colon  . COLONOSCOPY WITH PROPOFOL N/A 10/23/2019   Procedure: COLONOSCOPY WITH PROPOFOL;  Surgeon: Daneil Dolin, MD; external and grade 2 internal hemorrhoids, abnormal rectal blood vessels consistent with radiation proctitis s/p APC therapy, otherwise normal exam.  . EVALUATION UNDER ANESTHESIA WITH ANAL FISTULECTOMY N/A 01/14/2020   Procedure: ANORECTAL EXAM UNDER ANESTHESIA;  Surgeon: Ileana Roup, MD;  [facility];  Service: General;  Laterality: N/A;  . HOT HEMOSTASIS  10/23/2019   Procedure: HOT HEMOSTASIS (ARGON PLASMA COAGULATION/BICAP);  Surgeon: Daneil Dolin, MD;  [facility];  Service: Endoscopy;;  apc rectal proctitis    . KIDNEY SURGERY  1982   kidney tube collapse repair  . RADIOACTIVE SEED IMPLANT     Prostate    Social History   Socioeconomic History  . Marital status: Single    Spouse name: Not on file  . Number of children: Not on file  . Years of education: Not on file  . Highest education level: Not on file  Occupational History  . Occupation: Custodian     Employer: [employer]  Tobacco Use  . Smoking status: Former Smoker    Packs/day: 1.00  Years: 20.00    Pack years: 20.00    Types: Cigarettes    Quit date: 2020    Years since quitting: 1.7  . Smokeless tobacco: Former Systems developer  . Tobacco comment: Quit smoking x 2 years    07/2015   SOMETIIMES i USE VAPOR   Vaping Use  . Vaping Use: Never used  Substance and Sexual Activity  . Alcohol use: Never  . Drug use: Never  . Sexual activity: Not Currently  Other Topics Concern  . Not on file  Social History Narrative   Divorced   Social Determinants of Health   Financial Resource Strain:   . Difficulty of Paying Living Expenses: Not on file  Food Insecurity:   . Worried About Charity fundraiser in the Last Year: Not on file  . Ran Out of Food in the Last Year: Not on file  Transportation Needs:   . Lack of Transportation (Medical): Not on file   . Lack of Transportation (Non-Medical): Not on file  Physical Activity:   . Days of Exercise per Week: Not on file  . Minutes of Exercise per Session: Not on file  Stress:   . Feeling of Stress : Not on file  Social Connections:   . Frequency of Communication with Friends and Family: Not on file  . Frequency of Social Gatherings with Friends and Family: Not on file  . Attends Religious Services: Not on file  . Active Member of Clubs or Organizations: Not on file  . Attends Archivist Meetings: Not on file  . Marital Status: Not on file    Family History  Problem Relation Age of Onset  . Aneurysm Mother   . Hypertension Mother   . Prostate cancer Father   . Hypertension Father   . Colon cancer Neg Hx   . Colon polyps Neg Hx     Outpatient Encounter Medications as of 04/20/2020  Medication Sig  . alfuzosin (UROXATRAL) 10 MG 24 hr tablet Take 1 tablet (10 mg total) by mouth daily with breakfast.  . amLODipine (NORVASC) 5 MG tablet Take 5 mg by mouth daily.  . Cholecalciferol (VITAMIN D3) 125 MCG (5000 UT) CAPS Take 1 capsule by mouth.   . Coenzyme Q10 (COQ10) 100 MG CAPS Take 100 mg by mouth.   . Cyanocobalamin (B-12 PO) Take 1 tablet by mouth.   . diazepam (VALIUM) 5 MG tablet Take 5 mg by mouth daily as needed for anxiety.   . diclofenac (CATAFLAM) 50 MG tablet 2 times daily.  Marland Kitchen ELIQUIS 5 MG TABS tablet TAKE 1 TABLET(5 MG) BY MOUTH TWICE DAILY  . gabapentin (NEURONTIN) 300 MG capsule Take 300 mg by mouth 3 (three) times daily.  . hydrochlorothiazide (HYDRODIURIL) 25 MG tablet Take 25 mg by mouth daily.  . metoprolol tartrate (LOPRESSOR) 25 MG tablet Take 1 tablet (25 mg total) by mouth 2 (two) times daily.  Marland Kitchen NEXLETOL 180 MG TABS Take 180 mg by mouth daily. Pt taking 2-3 times a week  . pantoprazole (PROTONIX) 40 MG tablet TAKE 1 TABLET BY MOUTH DAILY 30 MINUTES BEFORE BREAKFAST  . polyethylene glycol powder (GLYCOLAX/MIRALAX) 17 GM/SCOOP powder Take by mouth.  .  potassium chloride SA (KLOR-CON) 20 MEQ tablet Take 20 mEq by mouth 2 (two) times daily.  . Probiotic Product (PROBIOTIC-10 PO) Take 1 capsule by mouth daily.  Marland Kitchen topiramate (TOPAMAX) 100 MG tablet Take 50 mg by mouth at bedtime.   . traMADol (ULTRAM) 50 MG tablet  tramadol 50 mg tablet  TAKE 1 TABLET BY MOUTH EVERY 6 HOURS AS NEEDED FOR UP TO 5 DAYS FOR SEVERE POSTOP PAIN NOT CONTROLLED WITH TYLENO/IBUPROFEN  . VITAMIN E PO Take 1 tablet by mouth.   . zinc gluconate 50 MG tablet Take 50 mg by mouth.   . [DISCONTINUED] gabapentin (NEURONTIN) 600 MG tablet Take 600 mg by mouth 3 (three) times daily.  . [DISCONTINUED] hydrocortisone 2.5 % cream hydrocortisone 2.5 % topical cream  . [DISCONTINUED] hyoscyamine (LEVBID) 0.375 MG 12 hr tablet Take by mouth.  . [DISCONTINUED] hyoscyamine (LEVSIN SL) 0.125 MG SL tablet Place under the tongue every 4 (four) hours.  . [DISCONTINUED] losartan (COZAAR) 50 MG tablet Take 50 mg by mouth daily.  . [DISCONTINUED] methimazole (TAPAZOLE) 5 MG tablet TAKE ONE TABLET BY MOUTH ONCE DAILY.  . [DISCONTINUED] pregabalin (LYRICA) 75 MG capsule Take 75 mg by mouth 2 (two) times daily.    No facility-administered encounter medications on file as of 04/20/2020.    ALLERGIES: Allergies  Allergen Reactions  . Latex Rash    Possible reaction to latex gloves per patient    VACCINATION STATUS:  There is no immunization history on file for this patient.   HPI  AJAY STRUBEL is 69 y.o. male who presents today with a medical history as above. he is being seen in follow-up after he was seen in consultation for hyperthyroidism requested by Jake Samples, PA-C.  Due to symptomatic presentation from transient hyperthyroidism secondary to subacute thyroiditis he was put on low-dose methimazole during his last visit.  His previsit labs show treatment effect with iatrogenic hypothyroidism. -He reports resolution of his previous symptoms.  He has steady weight.   he  denies dysphagia, choking, shortness of breath, no recent voice change.  He still has pain on the back of his neck.  he denies  family history of thyroid dysfunction. He  denies family hx of thyroid cancer. he denies personal history of goiter.                           Review of systems  Limited as above.   Objective:    BP 120/78   Ht 5\' 6"  (1.676 m)   Wt 170 lb (77.1 kg)   BMI 27.44 kg/m   Wt Readings from Last 3 Encounters:  04/20/20 170 lb (77.1 kg)  03/31/20 170 lb 6.4 oz (77.3 kg)  03/15/20 170 lb 6.4 oz (77.3 kg)       CMP     Component Value Date/Time   NA 139 02/19/2020 1622   NA 141 11/10/2019 1051   K 3.4 (L) 02/19/2020 1622   CL 103 02/19/2020 1622   CO2 28 02/19/2020 1622   GLUCOSE 112 (H) 02/19/2020 1622   BUN 8 04/16/2020 0000   CREATININE 1.0 04/16/2020 0000   CREATININE 0.98 02/19/2020 1622   CALCIUM 9.9 02/19/2020 1622   PROT 7.4 01/08/2020 1513   ALBUMIN 4.3 01/08/2020 1513   AST 18 01/08/2020 1513   ALT 15 01/08/2020 1513   ALKPHOS 47 01/08/2020 1513   BILITOT 0.8 01/08/2020 1513   GFRNONAA >60 02/19/2020 1622   GFRAA >60 02/19/2020 1622     CBC    Component Value Date/Time   WBC 3.5 (L) 02/19/2020 1622   RBC 4.43 02/19/2020 1622   HGB 13.6 02/19/2020 1622   HCT 40.0 02/19/2020 1622   PLT 250 02/19/2020 1622   MCV 90.3  02/19/2020 1622   MCH 30.7 02/19/2020 1622   MCHC 34.0 02/19/2020 1622   RDW 12.3 02/19/2020 1622   LYMPHSABS 1.4 02/19/2020 1622   MONOABS 0.3 02/19/2020 1622   EOSABS 0.0 02/19/2020 1622   BASOSABS 0.0 02/19/2020 1622   Lipid Panel     Component Value Date/Time   CHOL 142 04/16/2020 0000   TRIG 74 04/16/2020 0000   HDL 48 04/16/2020 0000   CHOLHDL 4.4 03/02/2019 0329   VLDL 18 03/02/2019 0329   LDLCALC 79 04/16/2020 0000     Lab Results  Component Value Date   TSH 5.20 04/16/2020   TSH 0.301 (L) 01/01/2020   TSH 0.282 (L) 10/22/2019   TSH 0.843 03/02/2019   FREET4 1.37 01/01/2020   FREET4 1.37  (H) 10/22/2019    Thyroid uptake and scan on Nov 28, 2019 FINDINGS: There is fairly homogeneous thyroid uptake. The left lobe extends more inferiorly than the right, although no focal hot or cold nodules are identified.  4 hour I-123 uptake = 4.1% (normal 5-20%) 24 hour I-123 uptake = 12.9% (normal 10-30%)  IMPRESSION: 1. Low normal radioactive iodine uptake by the thyroid gland. 2. No focal hot or cold nodules identified.    Assessment & Plan:   1. Hyperthyroidism-resolved His previsit labs are consistent with treatment effect and iatrogenic hypothyroidism.  He is advised to discontinue methimazole at this time.  He will have repeat thyroid function test and office visit in 3 months.  He has metoprolol 25 mg p.o. daily, amlodipine 10 mg p.o. daily as well as HCTZ 25 mg p.o. daily for blood pressure management.  Due to his reported dizziness, he was advised to discontinue amlodipine, however patient insists to keep it for now.  -He will be considered for thyroid/neck ultrasound after his next visit.   -Patient is advised to maintain close follow up with Jake Samples, PA-C for primary care needs.      - Time spent on this patient care encounter:  20 minutes of which 50% was spent in  counseling and the rest reviewing  his current and  previous labs / studies and medications  doses and developing a plan for long term care. Steven [name]  participated in the discussions, expressed understanding, and voiced agreement with the above plans.  All questions were answered to his satisfaction. he is encouraged to contact clinic should he have any questions or concerns prior to his return visit.    Follow up plan: Return in about 3 months (around 07/20/2020) for F/U with Pre-visit Labs.   Thank you for involving me in the care of this pleasant patient, and I will continue to update you with his progress.  Glade Lloyd, MD [facility] [place]  [facility] Phone: [phone]  Fax: [phone]   04/20/2020, 5:30 PM  This note was partially dictated with voice recognition software. Similar sounding words can be transcribed inadequately or may not  be corrected upon review.

## 2020-04-20 NOTE — Telephone Encounter (Signed)
Pt scheduled for phone visit this afternoon.

## 2020-05-10 ENCOUNTER — Other Ambulatory Visit: Payer: Self-pay | Admitting: Gastroenterology

## 2020-05-10 ENCOUNTER — Ambulatory Visit: Payer: BC Managed Care – PPO | Admitting: "Endocrinology

## 2020-05-14 ENCOUNTER — Telehealth (HOSPITAL_COMMUNITY): Payer: Self-pay | Admitting: Physical Therapy

## 2020-05-14 ENCOUNTER — Ambulatory Visit (HOSPITAL_COMMUNITY): Payer: BC Managed Care – PPO | Admitting: Physical Therapy

## 2020-05-14 ENCOUNTER — Emergency Department (HOSPITAL_COMMUNITY)
Admission: EM | Admit: 2020-05-14 | Discharge: 2020-05-14 | Disposition: A | Discharge status: Home / Self Care | Payer: BC Managed Care – PPO | Attending: Emergency Medicine | Admitting: Emergency Medicine

## 2020-05-14 ENCOUNTER — Telehealth: Payer: Self-pay

## 2020-05-14 ENCOUNTER — Encounter (HOSPITAL_COMMUNITY): Payer: Self-pay

## 2020-05-14 ENCOUNTER — Other Ambulatory Visit: Payer: Self-pay

## 2020-05-14 DIAGNOSIS — Z8546 Personal history of malignant neoplasm of prostate: Secondary | ICD-10-CM | POA: Diagnosis not present

## 2020-05-14 DIAGNOSIS — Z91040 Latex allergy status: Secondary | ICD-10-CM | POA: Diagnosis not present

## 2020-05-14 DIAGNOSIS — Z923 Personal history of irradiation: Secondary | ICD-10-CM | POA: Insufficient documentation

## 2020-05-14 DIAGNOSIS — I10 Essential (primary) hypertension: Secondary | ICD-10-CM | POA: Insufficient documentation

## 2020-05-14 DIAGNOSIS — Z79899 Other long term (current) drug therapy: Secondary | ICD-10-CM | POA: Insufficient documentation

## 2020-05-14 DIAGNOSIS — Z87891 Personal history of nicotine dependence: Secondary | ICD-10-CM | POA: Diagnosis not present

## 2020-05-14 DIAGNOSIS — R42 Dizziness and giddiness: Secondary | ICD-10-CM | POA: Diagnosis present

## 2020-05-14 DIAGNOSIS — I160 Hypertensive urgency: Secondary | ICD-10-CM

## 2020-05-14 LAB — BASIC METABOLIC PANEL
Anion gap: 8 (ref 5–15)
BUN: 10 mg/dL (ref 8–23)
CO2: 29 mmol/L (ref 22–32)
Calcium: 9.5 mg/dL (ref 8.9–10.3)
Chloride: 103 mmol/L (ref 98–111)
Creatinine, Ser: 0.88 mg/dL (ref 0.61–1.24)
GFR, Estimated: >60 mL/min (ref >60)
Glucose, Bld: 81 mg/dL (ref 70–99)
Potassium: 3.2 mmol/L — ABNORMAL LOW (ref 3.5–5.1)
Sodium: 140 mmol/L (ref 135–145)

## 2020-05-14 LAB — CBC WITH DIFFERENTIAL/PLATELET
Abs Immature Granulocytes: 0.01 10*3/uL (ref 0.00–0.07)
Basophils Absolute: 0 10*3/uL (ref 0.0–0.1)
Basophils Relative: 1 %
Eosinophils Absolute: 0.1 10*3/uL (ref 0.0–0.5)
Eosinophils Relative: 3 %
HCT: 39.5 % (ref 39.0–52.0)
Hemoglobin: 12.8 g/dL — ABNORMAL LOW (ref 13.0–17.0)
Immature Granulocytes: 0 %
Lymphocytes Relative: 55 %
Lymphs Abs: 2.1 10*3/uL (ref 0.7–4.0)
MCH: 29.8 pg (ref 26.0–34.0)
MCHC: 32.4 g/dL (ref 30.0–36.0)
MCV: 92.1 fL (ref 80.0–100.0)
Monocytes Absolute: 0.4 10*3/uL (ref 0.1–1.0)
Monocytes Relative: 10 %
Neutro Abs: 1.2 10*3/uL — ABNORMAL LOW (ref 1.7–7.7)
Neutrophils Relative %: 31 %
Platelets: 201 10*3/uL (ref 150–400)
RBC: 4.29 MIL/uL (ref 4.22–5.81)
RDW: 12.3 % (ref 11.5–15.5)
WBC: 3.8 10*3/uL — ABNORMAL LOW (ref 4.0–10.5)
nRBC: 0 % (ref 0.0–0.2)

## 2020-05-14 LAB — TSH: TSH: 2.276 u[IU]/mL (ref 0.350–4.500)

## 2020-05-14 NOTE — ED Triage Notes (Signed)
Pt reports coming in for hypertension. Pt says his bp at home was 170/100 and he took a BP pill (amlodipine 5mg ). Pt also states he has been dizzy and wants his potassium, thyroid, and cholesterol checked.

## 2020-05-14 NOTE — Discharge Instructions (Addendum)
Continue medications as previously prescribed.  Keep a record of your blood pressures and take this with you to your next doctor's appointment.   

## 2020-05-14 NOTE — Telephone Encounter (Signed)
pt is not feeling well and had to go to the ER

## 2020-05-14 NOTE — ED Provider Notes (Signed)
[facility] EMERGENCY DEPARTMENT Provider Note   CSN: [id number] Arrival date & time: 05/14/20  0430     History Chief Complaint  Patient presents with  . Hypertension    Steven [name] is a 69 y.o. male.  Patient is a 69 year old male with past medical history of atrial fibrillation, atrial flutter, hypertension, hyperthyroidism, hyperlipidemia.  He presents today for evaluation of dizziness and generalized malaise for the past 3 days.  Patient describes feeling as though he has little energy and has felt near syncopal on several occasions.  He checked his blood pressure this evening and it was 170/100.  This was concerning to him so he presents for evaluation of this.  He denies any chest pain or difficulty breathing.  He denies any headache, bloody or black stool.  He took an extra dose of his amlodipine prior to coming here and blood pressures are reading lower than they were at home.  The history is provided by the patient.       Past Medical History:  Diagnosis Date  . Arthritis   . Atrial fibrillation ([place])   . Atrial flutter ([place])   . BPH (benign prostatic hyperplasia)   . Chest pain   . Dizziness   . Dyspnea    laying down occ  . Fracture 08/17/2015   MULTIPLE RIB FRACTURES     FROM FALL   . GERD (gastroesophageal reflux disease)   . Hemorrhoids   . History of kidney stones    noted on CT scan  . History of radiation therapy 08/22/11-10/13/11   prostate  . Hyperlipemia   . Hypertension   . Hyperthyroidism   . Light headedness   . Numbness and tingling in left arm   . Numbness and tingling of both legs   . Prostate cancer [facility])    prostate s/p radiation Mar 2013    Patient Active Problem List   Diagnosis Date Noted  . Perineal pain 03/02/2020  . Subacute thyroiditis 01/07/2020  . Personal history of malignant neoplasm of prostate 12/12/2019  . Benign prostatic hyperplasia with urinary obstruction 12/12/2019  . Weak urinary stream 12/12/2019  .  Hyperthyroidism 11/24/2019  . Weight loss 10/21/2019  . IBS (irritable bowel syndrome) 10/21/2019  . Rectal pain 08/21/2019  . Nausea without vomiting 05/30/2019  . Atrial flutter ([place]) 04/17/2019  . Atrial flutter, paroxysmal ([place]) 03/02/2019  . HTN (hypertension) 03/02/2019  . Elevated troponin 03/01/2019  . Hypokalemia 03/01/2019  . Constipation 08/02/2017  . Hemorrhoids 08/02/2017  . Fall 08/18/2015  . Open fracture of left elbow 08/18/2015  . Rib fractures 08/17/2015  . Radiation proctitis 01/30/2013  . Rectal bleeding 12/10/2012  . Chest pain 05/30/2012  . Elevated lipids 05/30/2012  . Palpitations 05/30/2012  . Dyspnea 05/30/2012  . Obese 05/30/2012  . Prostate cancer ([place]) 08/13/2011    Past Surgical History:  Procedure Laterality Date  . BOTOX INJECTION N/A 01/14/2020   Procedure: INJECTION OF BOTOX INTO ANAL SPHINCTER;  Surgeon: Ileana Roup, MD;  Location: [facility] ORS;  Service: General;  Laterality: N/A;  . COLONOSCOPY N/A 12/19/2012   [id number] 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 Normal appearing colon  . COLONOSCOPY WITH PROPOFOL N/A 10/23/2019   Procedure: COLONOSCOPY WITH PROPOFOL;  Surgeon: Daneil Dolin, MD; external and grade 2 internal hemorrhoids, abnormal rectal blood vessels consistent with radiation proctitis s/p APC therapy, otherwise normal exam.  . EVALUATION UNDER ANESTHESIA WITH ANAL FISTULECTOMY N/A 01/14/2020  Procedure: ANORECTAL EXAM UNDER ANESTHESIA;  Surgeon: Ileana Roup, MD;  Location: [facility]S;  Service: General;  Laterality: N/A;  . HOT HEMOSTASIS  10/23/2019   Procedure: HOT HEMOSTASIS (ARGON PLASMA COAGULATION/BICAP);  Surgeon: Daneil Dolin, MD;  [facility];  Service: Endoscopy;;  apc rectal proctitis    . KIDNEY SURGERY  1982   kidney tube collapse repair  . RADIOACTIVE SEED IMPLANT     Prostate       Family History  Problem Relation Age of  Onset  . Aneurysm Mother   . Hypertension Mother   . Prostate cancer Father   . Hypertension Father   . Colon cancer Neg Hx   . Colon polyps Neg Hx     Social History   Tobacco Use  . Smoking status: Former Smoker    Packs/day: 1.00    Years: 20.00    Pack years: 20.00    Types: Cigarettes    Quit date: 2020    Years since quitting: 1.8  . Smokeless tobacco: Former Systems developer  . Tobacco comment: Quit smoking x 2 years    07/2015   SOMETIIMES i USE VAPOR   Vaping Use  . Vaping Use: Never used  Substance Use Topics  . Alcohol use: Never  . Drug use: Never    Home Medications Prior to Admission medications   Medication Sig Start Date End Date Taking? Authorizing Provider  alfuzosin (UROXATRAL) 10 MG 24 hr tablet Take 1 tablet (10 mg total) by mouth daily with breakfast. 03/31/20   [name], Candee Furbish, MD  amLODipine (NORVASC) 5 MG tablet Take 5 mg by mouth daily. 01/01/20   [provider]  Cholecalciferol (VITAMIN D3) 125 MCG (5000 UT) CAPS Take 1 capsule by mouth.     [provider]  Coenzyme Q10 (COQ10) 100 MG CAPS Take 100 mg by mouth.     [provider]  Cyanocobalamin (B-12 PO) Take 1 tablet by mouth.     [provider]  diazepam (VALIUM) 5 MG tablet Take 5 mg by mouth daily as needed for anxiety.  05/13/19   [provider]  diclofenac (CATAFLAM) 50 MG tablet 2 times daily.    [provider]  ELIQUIS 5 MG TABS tablet TAKE 1 TABLET(5 MG) BY MOUTH TWICE DAILY 03/08/20   Josue Hector, MD  gabapentin (NEURONTIN) 300 MG capsule Take 300 mg by mouth 3 (three) times daily. 02/24/20   [provider]  hydrochlorothiazide (HYDRODIURIL) 25 MG tablet Take 25 mg by mouth daily. 07/28/19   [provider]  metoprolol tartrate (LOPRESSOR) 25 MG tablet Take 1 tablet (25 mg total) by mouth 2 (two) times daily. 04/18/19   Emokpae, Courage, MD  NEXLETOL 180 MG TABS Take 180 mg by mouth daily. Pt taking 2-3 times a week 12/03/19    [provider]  pantoprazole (PROTONIX) 40 MG tablet TAKE 1 TABLET BY MOUTH DAILY 30 MINUTES BEFORE BREAKFAST 05/11/20   Annitta Needs, NP  polyethylene glycol powder (GLYCOLAX/MIRALAX) 17 GM/SCOOP powder Take by mouth.    [provider]  potassium chloride SA (KLOR-CON) 20 MEQ tablet Take 20 mEq by mouth 2 (two) times daily. 12/09/19   [provider]  Probiotic Product (PROBIOTIC-10 PO) Take 1 capsule by mouth daily.    [provider]  topiramate (TOPAMAX) 100 MG tablet Take 50 mg by mouth at bedtime.  09/04/19   [provider]  traMADol (ULTRAM) 50 MG tablet tramadol 50  mg tablet  TAKE 1 TABLET BY MOUTH EVERY 6 HOURS AS NEEDED FOR UP TO 5 DAYS FOR SEVERE POSTOP PAIN NOT CONTROLLED WITH TYLENO/IBUPROFEN    [provider]  VITAMIN E PO Take 1 tablet by mouth.     [provider]  zinc gluconate 50 MG tablet Take 50 mg by mouth.     [provider]    Allergies    Latex  Review of Systems   Review of Systems  All other systems reviewed and are negative.   Physical Exam Updated Vital Signs BP (!) 150/65 (BP Location: Left Arm)   Pulse (!) 52   Temp 98.8 F (37.1 C) (Oral)   Resp 18   Ht 5\' 6"  (1.676 m)   Wt 77.1 kg   SpO2 100%   BMI 27.44 kg/m   Physical Exam Vitals and nursing note reviewed.  Constitutional:      General: He is not in acute distress.    Appearance: He is well-developed. He is not diaphoretic.  HENT:     Head: Normocephalic and atraumatic.  Cardiovascular:     Rate and Rhythm: Normal rate and regular rhythm.     Heart sounds: No murmur heard.  No friction rub.  Pulmonary:     Effort: Pulmonary effort is normal. No respiratory distress.     Breath sounds: Normal breath sounds. No wheezing or rales.  Abdominal:     General: Bowel sounds are normal. There is no distension.     Palpations: Abdomen is soft.     Tenderness: There is no abdominal tenderness.  Musculoskeletal:         General: Normal range of motion.     Cervical back: Normal range of motion and neck supple.  Skin:    General: Skin is warm and dry.  Neurological:     General: No focal deficit present.     Mental Status: He is alert and oriented to person, place, and time.     Cranial Nerves: No cranial nerve deficit.     Coordination: Coordination normal.     ED Results / Procedures / Treatments   Labs (all labs ordered are listed, but only abnormal results are displayed) Labs Reviewed - No data to display  EKG EKG Interpretation  Date/Time:  Friday May 14 2020 05:31:46 EDT Ventricular Rate:  47 PR Interval:    QRS Duration: 100 QT Interval:  422 QTC Calculation: 373 R Axis:   59 Text Interpretation: Sinus bradycardia Posterior infarct, old Borderline repolarization abnormality No significant change since 04/27/2019 Confirmed by Veryl Speak [phone]) on 05/14/2020 5:43:12 AM   Radiology No results found.  Procedures Procedures (including critical care time)  Medications Ordered in ED Medications - No data to display  ED Course  I have reviewed the triage vital signs and the nursing notes.  Pertinent labs & imaging results that were available during my care of the patient were reviewed by me and considered in my medical decision making (see chart for details).    MDM Rules/Calculators/A&P  Patient is a 69 year old male presenting with complaints of feeling weak for the past few days.  His blood pressure was elevated this morning and presents for evaluation of it.  Laboratory studies are unremarkable and EKG is unchanged.  Patient's blood pressure has been 154 at his highest while here in the ER and he appears very comfortable.  Patient to keep a record of his blood pressures at home and follow-up with his primary  doctor.  TSH is pending.  Final Clinical Impression(s) / ED Diagnoses Final diagnoses:  None    Rx / DC Orders ED Discharge Orders    None       Veryl Speak, MD 05/14/20 [phone]

## 2020-05-17 ENCOUNTER — Telehealth: Payer: Self-pay | Admitting: Urology

## 2020-05-17 NOTE — Telephone Encounter (Signed)
Lft pt message to tell him to see PCP.

## 2020-05-17 NOTE — Telephone Encounter (Signed)
Uroxatral could cause dizzineess but his hypertension could also cause dizziness. Have him see his PCP Monday and we will go from there

## 2020-05-17 NOTE — Telephone Encounter (Signed)
Pt called and wanted to speak with a nurse. Please call when you can.

## 2020-05-17 NOTE — Telephone Encounter (Signed)
Pt had called back but said he did not remember calling this morning. I found a note from Dr. Alyson Ingles saying meds rxed from him but could also be bp meds . He advised pt to go to pcp first then call us back if needed.

## 2020-05-18 ENCOUNTER — Telehealth: Payer: Self-pay

## 2020-05-20 ENCOUNTER — Ambulatory Visit: Payer: BC Managed Care – PPO | Admitting: Neurology

## 2020-05-25 NOTE — Telephone Encounter (Signed)
See prior tasks.

## 2020-05-31 ENCOUNTER — Ambulatory Visit (HOSPITAL_COMMUNITY): Payer: BC Managed Care – PPO | Attending: Surgery | Admitting: Physical Therapy

## 2020-05-31 ENCOUNTER — Encounter (HOSPITAL_COMMUNITY): Payer: Self-pay | Admitting: Physical Therapy

## 2020-05-31 DIAGNOSIS — R29898 Other symptoms and signs involving the musculoskeletal system: Secondary | ICD-10-CM | POA: Insufficient documentation

## 2020-05-31 DIAGNOSIS — M542 Cervicalgia: Secondary | ICD-10-CM

## 2020-05-31 NOTE — Therapy (Signed)
[place], [place], [address] Phone: [phone]   Fax:  [phone]  Physical Therapy Evaluation  Patient Details  Name: Steven [name] MRN: [id number] Date of Birth: 30-Jan-1951 Referring Provider (PT): Duffy Rhody MD   Encounter Date: 05/31/2020   PT End of Session - 05/31/20 1130    Visit Number 1    Number of Visits 8    Date for PT Re-Evaluation 06/28/20    Authorization Type Primary BCBS Secondary Medicare    PT Start Time 1045    PT Stop Time 1126    PT Time Calculation (min) 41 min    Activity Tolerance Patient tolerated treatment well    Behavior During Therapy [facility] for tasks assessed/performed           Past Medical History:  Diagnosis Date  . Arthritis   . Atrial fibrillation ([place])   . Atrial flutter ([place])   . BPH (benign prostatic hyperplasia)   . Chest pain   . Dizziness   . Dyspnea    laying down occ  . Fracture 08/17/2015   MULTIPLE RIB FRACTURES     FROM FALL   . GERD (gastroesophageal reflux disease)   . Hemorrhoids   . History of kidney stones    noted on CT scan  . History of radiation therapy 08/22/11-10/13/11   prostate  . Hyperlipemia   . Hypertension   . Hyperthyroidism   . Light headedness   . Numbness and tingling in left arm   . Numbness and tingling of both legs   . Prostate cancer [facility])    prostate s/p radiation Mar 2013    Past Surgical History:  Procedure Laterality Date  . BOTOX INJECTION N/A 01/14/2020   Procedure: INJECTION OF BOTOX INTO ANAL SPHINCTER;  Surgeon: Ileana Roup, MD;  Location: [facility];  Service: General;  Laterality: N/A;  . COLONOSCOPY N/A 12/19/2012   [id number] 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 Normal appearing colon  . COLONOSCOPY WITH PROPOFOL N/A 10/23/2019   Procedure: COLONOSCOPY WITH PROPOFOL;  Surgeon: Daneil Dolin, MD; external and grade 2 internal  hemorrhoids, abnormal rectal blood vessels consistent with radiation proctitis s/p APC therapy, otherwise normal exam.  . EVALUATION UNDER ANESTHESIA WITH ANAL FISTULECTOMY N/A 01/14/2020   Procedure: ANORECTAL EXAM UNDER ANESTHESIA;  Surgeon: Ileana Roup, MD;  Location[facility];  Service: General;  Laterality: N/A;  . HOT HEMOSTASIS  10/23/2019   Procedure: HOT HEMOSTASIS (ARGON PLASMA COAGULATION/BICAP);  Surgeon: Daneil Dolin, MD;  [facility];  Service: Endoscopy;;  apc rectal proctitis    . KIDNEY SURGERY  1982   kidney tube collapse repair  . RADIOACTIVE SEED IMPLANT     Prostate    There were no vitals filed for this visit.    Subjective Assessment - 05/31/20 1051    Subjective Patient is a 69 y.o. male who presents to physical therapy with c/o neck pain. Patient states his neck does not hurt to bad. At night it wants to hurt sometimes. His neck pops sometimes but it's not painful. He has a lot of other health conditions going on too. He has burning going into his arms sometimes that comes and goes. It tends to happen late at night when going to bed and he states it has burning in his neck and heads. He also has popping in low back and sometimes muscle spasms. He  also has popping in his shoulder bones. He had an MRI done about a month ago. He is going to a spine specialist who said he had a bulging disc that wasn't serious enough for an operation. He has increased LUE symptoms when he has to go to the bathroom. He went to a chiropractor last week. He was given some pain meds to and told to do physical therapy.    Pertinent History prostate cancer; irritable bowel syndrome; carpal tunnel; 2017 fx radius LT    Currently in Pain? No/denies    Pain Score 1     Pain Location Hand    Pain Descriptors / Indicators Tingling              [facility] PT Assessment - 05/31/20 0001      Assessment   Medical Diagnosis Cervical Radiculopathy    Referring Provider (PT) Duffy Rhody MD    Onset Date/Surgical Date 01/29/20    Next MD Visit no follow up scheduled    Prior Therapy yes      Precautions   Precautions None      Restrictions   Weight Bearing Restrictions No      Balance Screen   Has the patient fallen in the past 6 months No      [place] residence      Prior Function   Level of Independence Independent      Cognition   Overall Cognitive Status Within Functional Limits for tasks assessed      Observation/Other Assessments   Observations slouched in seated    Focus on Therapeutic Outcomes (FOTO)  not completed today secondary to time constraints      Sensation   Light Touch Appears Intact      ROM / Strength   AROM / PROM / Strength AROM;Strength      AROM   Overall AROM Comments no change in UE syptoms    AROM Assessment Site Cervical    Cervical Flexion 0% limited    Cervical Extension 25% limited    Cervical - Right Side Bend 25% limited    Cervical - Left Side Bend 25% limited, muscle stretch in L UT    Cervical - Right Rotation 0% limited    Cervical - Left Rotation 0% limited; strain/ stiffness in L c/sp      Strength   Strength Assessment Site Shoulder;Elbow;Wrist;Hand    Right/Left Shoulder Right;Left    Right Shoulder Flexion 5/5    Right Shoulder Extension 5/5    Left Shoulder Flexion 5/5    Left Shoulder Extension 5/5    Right/Left Elbow Right;Left    Right Elbow Flexion 5/5    Right Elbow Extension 5/5    Left Elbow Flexion 5/5    Left Elbow Extension 5/5    Right/Left Wrist Right;Left    Right Wrist Flexion 5/5    Right Wrist Extension 5/5    Left Wrist Flexion 5/5    Left Wrist Extension 5/5    Right/Left hand Right;Left    Right Hand Gross Grasp Functional    Left Hand Gross Grasp Functional      Palpation   Spinal mobility grossly hypomobile cervical spine    Palpation comment TTP cervical paraspinals R>L, hyperactive musculature, no tenderness in bilateral UE       Special Tests   Other special tests Traction: no change in symptoms  Objective measurements completed on examination: See above findings.       [facility] Adult PT Treatment/Exercise - 05/31/20 0001      Exercises   Exercises Neck      Neck Exercises: Supine   Neck Retraction 10 reps;3 secs    Neck Retraction Limitations 2 sets                  PT Education - 05/31/20 1101    Education Details Patient educated on exam findings, POC, Scope of PT, initial HEP    Person(s) Educated Patient    Methods Explanation;Demonstration;Handout    Comprehension Verbalized understanding;Returned demonstration            PT Short Term Goals - 05/31/20 1140      PT SHORT TERM GOAL #1   Title Patient will be independent with HEP in order to improve functional outcomes.    Time 2    Period Weeks    Status New    Target Date 06/14/20      PT SHORT TERM GOAL #2   Title Patient will report at least 25% improvement in symptoms for improved quality of life.    Time 2    Period Weeks    Status New    Target Date 06/14/20             PT Long Term Goals - 05/31/20 1140      PT LONG TERM GOAL #1   Title Patient will report at least 75% improvement in symptoms for improved quality of life.    Time 4    Period Weeks    Status New    Target Date 06/28/20      PT LONG TERM GOAL #2   Title Patient will be able to sleep with centralized UE symptoms no greater than 2/10 for improved quality of rest.    Time 4    Period Weeks    Status New    Target Date 06/28/20      PT LONG TERM GOAL #3   Title Patient will demonstrate at least 25% improvement in cervical ROM in all restricted planes for improved ability to move head while at completing ADL.    Time 4    Period Weeks    Status New    Target Date 06/28/20                  Plan - 05/31/20 1130    Clinical Impression Statement Patient is a 70 y.o. male who presents to physical  therapy with c/o neck pain and bilateral UE symptoms. He presents with pain limited deficits in cervical spine ROM, endurance, postural impairments, spinal mobility, hyperactive and tender musculature and functional mobility with ADL. He is having to modify and restrict ADL as indicated by subjective information and objective measures which is affecting overall participation. Patient will benefit from skilled physical therapy in order to improve function and reduce impairment.    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/Uncomplicated    Designer, jewellery Low    Rehab Potential Fair    PT Frequency 2x / week    PT Duration 4 weeks    PT Treatment/Interventions Patient/family education;Therapeutic exercise;Manual techniques;ADLs/Self Care Home Management;Aquatic Therapy;Cryotherapy;Electrical Stimulation;Iontophoresis 4mg /ml Dexamethasone;Moist Heat;Traction;Ultrasound;Stair training;DME Instruction;Functional mobility training;Gait training;Therapeutic activities;Balance training;Neuromuscular re-education;Orthotic Fit/Training;Dry needling;Energy conservation;Splinting;Spinal  Manipulations;Joint Manipulations    PT Next Visit Plan complete FOTO, f/u with cervical retraction in supine, Possibly begin manual for pain/ mobility to cervical spine, postural strengtheing, cervcial and thoracic mobility,  transition to HEP as able with possible decrease to 1x/week due to lack of transporation    PT Home Exercise Plan 11/8 cervical retraction in supine    Consulted and Agree with Plan of Care Patient           Patient will benefit from skilled therapeutic intervention in order to  improve the following deficits and impairments:  Pain, Increased muscle spasms, Decreased activity tolerance, Decreased mobility, Decreased range of motion, Hypomobility, Improper body mechanics, Postural dysfunction, Decreased endurance, Decreased strength  Visit Diagnosis: Cervicalgia  Other symptoms and signs involving the musculoskeletal system     Problem List Patient Active Problem List   Diagnosis Date Noted  . Perineal pain 03/02/2020  . Subacute thyroiditis 01/07/2020  . Personal history of malignant neoplasm of prostate 12/12/2019  . Benign prostatic hyperplasia with urinary obstruction 12/12/2019  . Weak urinary stream 12/12/2019  . Hyperthyroidism 11/24/2019  . Weight loss 10/21/2019  . IBS (irritable bowel syndrome) 10/21/2019  . Rectal pain 08/21/2019  . Nausea without vomiting 05/30/2019  . Atrial flutter ([place]) 04/17/2019  . Atrial flutter, paroxysmal ([place]) 03/02/2019  . HTN (hypertension) 03/02/2019  . Elevated troponin 03/01/2019  . Hypokalemia 03/01/2019  . Constipation 08/02/2017  . Hemorrhoids 08/02/2017  . Fall 08/18/2015  . Open fracture of left elbow 08/18/2015  . Rib fractures 08/17/2015  . Radiation proctitis 01/30/2013  . Rectal bleeding 12/10/2012  . Chest pain 05/30/2012  . Elevated lipids 05/30/2012  . Palpitations 05/30/2012  . Dyspnea 05/30/2012  . Obese 05/30/2012  . Prostate cancer ([place]) 08/13/2011   11:44 AM, 05/31/20 Mearl Latin PT, DPT Physical Therapist at [place], [place], [address] Phone: [phone]   Fax:  [phone]  Name: FINNEUS KANESHIRO MRN: [id number] Date of Birth: July 15, 1951

## 2020-05-31 NOTE — Patient Instructions (Signed)
Access Code: [email or web address] URL: [email or web address] Date: 05/31/2020 Prepared by: Mitzi Hansen Jaedynn Bohlken  Exercises Supine Chin Tuck - 2 x daily - 7 x weekly - 2 sets - 10 reps - 2 second hold

## 2020-06-02 ENCOUNTER — Ambulatory Visit (HOSPITAL_COMMUNITY): Payer: BC Managed Care – PPO

## 2020-06-02 ENCOUNTER — Telehealth (HOSPITAL_COMMUNITY): Payer: Self-pay

## 2020-06-02 NOTE — Telephone Encounter (Signed)
pt called to cx this appt due to he is not feeling well 

## 2020-06-04 ENCOUNTER — Other Ambulatory Visit: Payer: Self-pay

## 2020-06-04 ENCOUNTER — Emergency Department (HOSPITAL_COMMUNITY): Payer: BC Managed Care – PPO

## 2020-06-04 ENCOUNTER — Encounter (HOSPITAL_COMMUNITY): Payer: Self-pay

## 2020-06-04 ENCOUNTER — Emergency Department (HOSPITAL_COMMUNITY)
Admission: EM | Admit: 2020-06-04 | Discharge: 2020-06-04 | Disposition: A | Discharge status: Home / Self Care | Payer: BC Managed Care – PPO | Attending: Emergency Medicine | Admitting: Emergency Medicine

## 2020-06-04 DIAGNOSIS — Z87891 Personal history of nicotine dependence: Secondary | ICD-10-CM | POA: Diagnosis not present

## 2020-06-04 DIAGNOSIS — M79604 Pain in right leg: Secondary | ICD-10-CM | POA: Diagnosis not present

## 2020-06-04 DIAGNOSIS — M79622 Pain in left upper arm: Secondary | ICD-10-CM | POA: Insufficient documentation

## 2020-06-04 DIAGNOSIS — M79605 Pain in left leg: Secondary | ICD-10-CM | POA: Insufficient documentation

## 2020-06-04 DIAGNOSIS — M79621 Pain in right upper arm: Secondary | ICD-10-CM | POA: Insufficient documentation

## 2020-06-04 DIAGNOSIS — I10 Essential (primary) hypertension: Secondary | ICD-10-CM | POA: Insufficient documentation

## 2020-06-04 DIAGNOSIS — Z8546 Personal history of malignant neoplasm of prostate: Secondary | ICD-10-CM | POA: Insufficient documentation

## 2020-06-04 DIAGNOSIS — Z91040 Latex allergy status: Secondary | ICD-10-CM | POA: Diagnosis not present

## 2020-06-04 DIAGNOSIS — R519 Headache, unspecified: Secondary | ICD-10-CM | POA: Diagnosis present

## 2020-06-04 NOTE — Discharge Instructions (Signed)
You have been seen in the Emergency Department (ED) for a headache.  Please use Tylenol as needed for symptoms, but only as written on the box.  As we have discussed, please follow up with your primary care doctor as soon as possible regarding today's Emergency Department (ED) visit and your headache symptoms.    Call your doctor or return to the ED if you have a worsening headache, sudden and severe headache, confusion, slurred speech, facial droop, weakness or numbness in any arm or leg, extreme fatigue, vision problems, or other symptoms that concern you.  

## 2020-06-04 NOTE — ED Provider Notes (Signed)
Emergency Department Provider Note   I have reviewed the triage vital signs and the nursing notes.   HISTORY  Chief Complaint Headache   HPI Steven [name] is a 69 y.o. male with past medical history reviewed below presents to the emergency department with recurrent headache symptoms.  He describes the headaches as burning and intermittent.  He is taking Tylenol which resolved symptoms temporarily but then they returned.  He has associated burning pain in his bilateral upper and lower extremities as well.  Symptoms have worsened over the past several weeks. He he follows with Dr. Merlene Laughter who he saw yesterday for HA. He has been taking his Topamax and Gabapentin as prescribed. He denies fever, chills, unilateral weakness/numbness. No sudden onset, maximal intensity HA symptoms. No radiation of symptoms or modifying factors.    Past Medical History:  Diagnosis Date   Arthritis    Atrial fibrillation (HCC)    Atrial flutter (HCC)    BPH (benign prostatic hyperplasia)    Chest pain    Dizziness    Dyspnea    laying down occ   Fracture 08/17/2015   MULTIPLE RIB FRACTURES     FROM FALL    GERD (gastroesophageal reflux disease)    Hemorrhoids    History of kidney stones    noted on CT scan   History of radiation therapy 08/22/11-10/13/11   prostate   Hyperlipemia    Hypertension    Hyperthyroidism    Light headedness    Numbness and tingling in left arm    Numbness and tingling of both legs    Prostate cancer [facility])    prostate s/p radiation Mar 2013    Patient Active Problem List   Diagnosis Date Noted   Perineal pain 03/02/2020   Subacute thyroiditis 01/07/2020   Personal history of malignant neoplasm of prostate 12/12/2019   Benign prostatic hyperplasia with urinary obstruction 12/12/2019   Weak urinary stream 12/12/2019   Hyperthyroidism 11/24/2019   Weight loss 10/21/2019   IBS (irritable bowel syndrome) 10/21/2019   Rectal pain  08/21/2019   Nausea without vomiting 05/30/2019   Atrial flutter ([place]) 04/17/2019   Atrial flutter, paroxysmal ([place]) 03/02/2019   HTN (hypertension) 03/02/2019   Elevated troponin 03/01/2019   Hypokalemia 03/01/2019   Constipation 08/02/2017   Hemorrhoids 08/02/2017   Fall 08/18/2015   Open fracture of left elbow 08/18/2015   Rib fractures 08/17/2015   Radiation proctitis 01/30/2013   Rectal bleeding 12/10/2012   Chest pain 05/30/2012   Elevated lipids 05/30/2012   Palpitations 05/30/2012   Dyspnea 05/30/2012   Obese 05/30/2012   Prostate cancer ([place]) 08/13/2011    Past Surgical History:  Procedure Laterality Date   BOTOX INJECTION N/A 01/14/2020   Procedure: INJECTION OF BOTOX INTO ANAL SPHINCTER;  Surgeon: Ileana Roup, MD;  Location: [facility];  Service: General;  Laterality: N/A;   COLONOSCOPY N/A 12/19/2012   YQM:VHQION 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 Normal appearing colon   COLONOSCOPY WITH PROPOFOL N/A 10/23/2019   Procedure: COLONOSCOPY WITH PROPOFOL;  Surgeon: Daneil Dolin, MD; external and grade 2 internal hemorrhoids, abnormal rectal blood vessels consistent with radiation proctitis s/p APC therapy, otherwise normal exam.   EVALUATION UNDER ANESTHESIA WITH ANAL FISTULECTOMY N/A 01/14/2020   Procedure: ANORECTAL EXAM UNDER ANESTHESIA;  Surgeon: Ileana Roup, MD;  Location: [facility];  Service: General;  Laterality: N/A;   HOT HEMOSTASIS  10/23/2019   Procedure: HOT  HEMOSTASIS (ARGON PLASMA COAGULATION/BICAP);  Surgeon: Daneil Dolin, MD;  [facility];  Service: Endoscopy;;  apc rectal proctitis     KIDNEY SURGERY  1982   kidney tube collapse repair   RADIOACTIVE SEED IMPLANT     Prostate    Allergies Latex  Family History  Problem Relation Age of Onset   Aneurysm Mother    Hypertension Mother    Prostate cancer Father    Hypertension Father     Colon cancer Neg Hx    Colon polyps Neg Hx     Social History Social History   Tobacco Use   Smoking status: Former Smoker    Packs/day: 1.00    Years: 20.00    Pack years: 20.00    Types: Cigarettes    Quit date: 2020    Years since quitting: 1.8   Smokeless tobacco: Former Systems developer   Tobacco comment: Quit smoking x 2 years    07/2015   SOMETIIMES i USE VAPOR   Vaping Use   Vaping Use: Never used  Substance Use Topics   Alcohol use: Never   Drug use: Never    Review of Systems  Constitutional: No fever/chills Eyes: No visual changes. ENT: No sore throat. Cardiovascular: Denies chest pain. Respiratory: Denies shortness of breath. Gastrointestinal: No abdominal pain.  No nausea, no vomiting.  No diarrhea.  No constipation. Genitourinary: Negative for dysuria. Musculoskeletal: Negative for back pain. Skin: Negative for rash. Neurological: Negative for focal weakness or numbness. Positive intermittent HA.   10-point ROS otherwise negative.  ____________________________________________   PHYSICAL EXAM:  VITAL SIGNS: ED Triage Vitals [06/04/20 0936]  Enc Vitals Group     BP (!) 146/69     Pulse Rate (!) 55     Resp 16     Temp 98.6 F (37 C)     Temp Source Oral     SpO2 99 %     Weight 175 lb (79.4 kg)     Height 5\' 6"  (1.676 m)   Constitutional: Alert and oriented. Well appearing and in no acute distress. Eyes: Conjunctivae are normal. PERRL. EOMI. Head: Atraumatic. Ears:  Healthy appearing ear canals and TMs bilaterally Nose: No congestion/rhinnorhea. Mouth/Throat: Mucous membranes are moist.   Neck: No stridor. Cardiovascular: Good peripheral circulation. Respiratory: Normal respiratory effort.   Gastrointestinal: No distention.  Musculoskeletal: No gross deformities of extremities. Neurologic:  Normal speech and language. No facial asymmetry. 5/5 strength in the upper and lower extremities. Normal gait.  Skin:  Skin is warm, dry and intact.  No rash noted.  ____________________________________________  RADIOLOGY  CT Head Wo Contrast  Result Date: 06/04/2020 CLINICAL DATA:  Headache EXAM: CT HEAD WITHOUT CONTRAST TECHNIQUE: Contiguous axial images were obtained from the base of the skull through the vertex without intravenous contrast. COMPARISON:  CT 02/19/2020 FINDINGS: Brain: No evidence of acute infarction, hemorrhage, hydrocephalus, extra-axial collection or mass lesion/mass effect. Vascular: No hyperdense vessel or unexpected calcification. Skull: Normal. Negative for fracture or focal lesion. Sinuses/Orbits: No acute finding. Other: None IMPRESSION: Negative non contrasted CT appearance of the brain. Electronically Signed   By: Donavan Foil M.D.   On: 06/04/2020 17:49    ____________________________________________   PROCEDURES  Procedure(s) performed:   Procedures  None  ____________________________________________   INITIAL IMPRESSION / ASSESSMENT AND PLAN / ED COURSE  Pertinent labs & imaging results that were available during my care of the patient were reviewed by me and considered in my medical decision making (see  chart for details).   Patient presents to the emergency department with acute on chronic headache worsening over the past 2 weeks.  Last had imaging in July 2020 one of the head with noncontrast CT scan which is normal.  My suspicion for subarachnoid hemorrhage, intracranial mass, other emergent cause for headache is low but the patient is greater than 65 with history of prostate cancer.  He is describing sudden worsening in his headache pattern and symptoms.  Plan for noncontrast CT imaging in the ED.  He follows closely with his neurologist Dr. Merlene Laughter.   CT head negative. Plan for Neurology follow up. No HA currently in the ED. No weakness or radicular symptoms requiring emergent MRI of the head/c spine.  ____________________________________________  FINAL CLINICAL IMPRESSION(S) / ED  DIAGNOSES  Final diagnoses:  Acute nonintractable headache, unspecified headache type    Note:  This document was prepared using Dragon voice recognition software and may include unintentional dictation errors.  Nanda Quinton, MD, [facility] Emergency Medicine    Collen Vincent, [facility], MD 06/05/20 1242

## 2020-06-04 NOTE — ED Triage Notes (Signed)
Pt presents to ED with recurrent headache. Pt states he can take tylenol and headache comes back every 4 hours. Pt states he seen neurologist yesterday for this.

## 2020-06-04 NOTE — ED Notes (Signed)
ED Provider at bedside. 

## 2020-06-07 NOTE — Progress Notes (Signed)
Cardiology Office Note    Date:  06/15/2020   ID:  Steven [name], DOB 20-Oct-1950, MRN [id number]  PCP:  Jake Samples, PA-C  Cardiologist: Jenkins Rouge, MD    No chief complaint on file.   History of Present Illness:    Steven [name] is a 69 y.o. male with past medical history of paroxysmal atrial fibrillation/flutter, HTN, and HLD F/U Cardiac monitor done 04/09/19 for dizziness showed no long pauses and no more PAF. Myovue done 06/25/19 with no  Ischemia EF 62% Echo 03/02/19 EF 60-65% no valve disease Lopressor was decreased with no change in dizziness  Still has dizziness, swimmy headed feeling  Now on eliquis for anticoagulation BP only 989 systolic and w/u negative  Seen in ER 05/14/20 with elevated BP and malaise with dizziness  He sees Dr Merlene Laughter for headaches and was seen in ER for this 06/04/20 Takes Topamax and Gabapentin CT head negative for bleed or other pathology   Continues to have his issues with likely panic attacks with headaches, pounding in head and dizziness   Past Medical History:  Diagnosis Date  . Arthritis   . Atrial fibrillation ([place])   . Atrial flutter ([place])   . BPH (benign prostatic hyperplasia)   . Chest pain   . Dizziness   . Dyspnea    laying down occ  . Fracture 08/17/2015   MULTIPLE RIB FRACTURES     FROM FALL   . GERD (gastroesophageal reflux disease)   . Hemorrhoids   . History of kidney stones    noted on CT scan  . History of radiation therapy 08/22/11-10/13/11   prostate  . Hyperlipemia   . Hypertension   . Hyperthyroidism   . Light headedness   . Numbness and tingling in left arm   . Numbness and tingling of both legs   . Prostate cancer [facility])    prostate s/p radiation Mar 2013    Past Surgical History:  Procedure Laterality Date  . BOTOX INJECTION N/A 01/14/2020   Procedure: INJECTION OF BOTOX INTO ANAL SPHINCTER;  Surgeon: Ileana Roup, MD;  Location: [facility];  Service: General;  Laterality: N/A;  .  COLONOSCOPY N/A 12/19/2012   QJJ:HERDEY 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 Normal appearing colon  . COLONOSCOPY WITH PROPOFOL N/A 10/23/2019   Procedure: COLONOSCOPY WITH PROPOFOL;  Surgeon: Daneil Dolin, MD; external and grade 2 internal hemorrhoids, abnormal rectal blood vessels consistent with radiation proctitis s/p APC therapy, otherwise normal exam.  . EVALUATION UNDER ANESTHESIA WITH ANAL FISTULECTOMY N/A 01/14/2020   Procedure: ANORECTAL EXAM UNDER ANESTHESIA;  Surgeon: Ileana Roup, MD;  Location: [facility];  Service: General;  Laterality: N/A;  . HOT HEMOSTASIS  10/23/2019   Procedure: HOT HEMOSTASIS (ARGON PLASMA COAGULATION/BICAP);  Surgeon: Daneil Dolin, MD;  [facility];  Service: Endoscopy;;  apc rectal proctitis    . KIDNEY SURGERY  1982   kidney tube collapse repair  . RADIOACTIVE SEED IMPLANT     Prostate    Current Medications: Outpatient Medications Prior to Visit  Medication Sig Dispense Refill  . alfuzosin (UROXATRAL) 10 MG 24 hr tablet Take 1 tablet (10 mg total) by mouth daily with breakfast. 90 tablet 3  . amitriptyline (ELAVIL) 10 MG tablet TAKE 2 TABLETS BY MOUTH EVERY DAY AT BEDTIME    . amLODipine (NORVASC) 10 MG tablet Take 10 mg by mouth daily.    Marland Kitchen  Cholecalciferol (VITAMIN D3) 125 MCG (5000 UT) CAPS Take 1 capsule by mouth.     . Cyanocobalamin (B-12 PO) Take 1 tablet by mouth.     . diazepam (VALIUM) 5 MG tablet Take 5 mg by mouth daily as needed for anxiety.     . diclofenac (CATAFLAM) 50 MG tablet 2 times daily.    Marland Kitchen ELIQUIS 5 MG TABS tablet TAKE 1 TABLET(5 MG) BY MOUTH TWICE DAILY 60 tablet 5  . gabapentin (NEURONTIN) 300 MG capsule Take 300 mg by mouth 3 (three) times daily.    . hydrochlorothiazide (HYDRODIURIL) 25 MG tablet Take 25 mg by mouth daily.    . metoprolol tartrate (LOPRESSOR) 25 MG tablet Take 1 tablet (25 mg total) by mouth 2 (two) times daily. 60 tablet 2   . NEXLETOL 180 MG TABS Take 180 mg by mouth daily. Pt taking 2-3 times a week    . ondansetron (ZOFRAN ODT) 4 MG disintegrating tablet Take 1 tablet (4 mg total) by mouth every 8 (eight) hours as needed for nausea or vomiting. 30 tablet 0  . pantoprazole (PROTONIX) 40 MG tablet TAKE 1 TABLET BY MOUTH DAILY 30 MINUTES BEFORE BREAKFAST 90 tablet 3  . polyethylene glycol powder (GLYCOLAX/MIRALAX) 17 GM/SCOOP powder Take by mouth.    . potassium chloride SA (KLOR-CON) 20 MEQ tablet Take 20 mEq by mouth 2 (two) times daily.    . Probiotic Product (PROBIOTIC-10 PO) Take 1 capsule by mouth daily.    . traMADol (ULTRAM) 50 MG tablet tramadol 50 mg tablet  TAKE 1 TABLET BY MOUTH EVERY 6 HOURS AS NEEDED FOR UP TO 5 DAYS FOR SEVERE POSTOP PAIN NOT CONTROLLED WITH TYLENO/IBUPROFEN    . amLODipine (NORVASC) 5 MG tablet Take 5 mg by mouth daily.    . Coenzyme Q10 (COQ10) 100 MG CAPS Take 100 mg by mouth.     Marland Kitchen VITAMIN E PO Take 1 tablet by mouth.     . zinc gluconate 50 MG tablet Take 50 mg by mouth.     . topiramate (TOPAMAX) 100 MG tablet Take 50 mg by mouth at bedtime.      No facility-administered medications prior to visit.     Allergies:   Latex   Social History   Socioeconomic History  . Marital status: Single    Spouse name: Not on file  . Number of children: Not on file  . Years of education: Not on file  . Highest education level: Not on file  Occupational History  . Occupation: Custodian     Employer: [employer]  Tobacco Use  . Smoking status: Former Smoker    Packs/day: 1.00    Years: 20.00    Pack years: 20.00    Types: Cigarettes    Quit date: 2020    Years since quitting: 1.8  . Smokeless tobacco: Former Systems developer  . Tobacco comment: Quit smoking x 2 years    07/2015   SOMETIIMES i USE VAPOR   Vaping Use  . Vaping Use: Never used  Substance and Sexual Activity  . Alcohol use: Never  . Drug use: Never  . Sexual activity: Not Currently  Other Topics Concern  .  Not on file  Social History Narrative   Divorced   Social Determinants of Health   Financial Resource Strain:   . Difficulty of Paying Living Expenses: Not on file  Food Insecurity:   . Worried About Charity fundraiser in the Last Year: Not on file  .  Ran Out of Food in the Last Year: Not on file  Transportation Needs:   . Lack of Transportation (Medical): Not on file  . Lack of Transportation (Non-Medical): Not on file  Physical Activity:   . Days of Exercise per Week: Not on file  . Minutes of Exercise per Session: Not on file  Stress:   . Feeling of Stress : Not on file  Social Connections:   . Frequency of Communication with Friends and Family: Not on file  . Frequency of Social Gatherings with Friends and Family: Not on file  . Attends Religious Services: Not on file  . Active Member of Clubs or Organizations: Not on file  . Attends Archivist Meetings: Not on file  . Marital Status: Not on file     Family History:  The patient's family history includes Aneurysm in his mother; Prostate cancer in his father.   Review of Systems:   Please see the history of present illness.     General:  No chills, fever, night sweats or weight changes.  Cardiovascular:  No chest pain, dyspnea on exertion, edema, orthopnea, palpitations, paroxysmal nocturnal dyspnea. Dermatological: No rash, lesions/masses Respiratory: No cough, dyspnea Urologic: No hematuria, dysuria Abdominal:   No nausea, vomiting, diarrhea, melena, or hematemesis. Positive for BRBPR.  Neurologic:  No visual changes, wkns, changes in mental status. Positive for dizziness.    All other systems reviewed and are otherwise negative except as noted above.   Physical Exam:    VS:  BP 116/70   Pulse (!) 58   Ht 5\' 6"  (1.676 m)   Wt 79.8 kg   SpO2 98%   BMI 28.41 kg/m     Affect appropriate Healthy:  appears stated age 36: normal Neck supple with no adenopathy JVP normal no bruits no  thyromegaly Lungs clear with no wheezing and good diaphragmatic motion Heart:  S1/S2 no murmur, no rub, gallop or click PMI normal Abdomen: benighn, BS positve, no tenderness, no AAA no bruit.  No HSM or HJR Distal pulses intact with no bruits No edema Neuro non-focal Skin warm and dry No muscular weakness   Wt Readings from Last 3 Encounters:  06/15/20 79.8 kg  06/08/20 79.8 kg  06/08/20 79.8 kg     Studies/Labs Reviewed:   EKG:  EKG is not ordered today.    Recent Labs: 01/08/2020: ALT 15 05/14/2020: TSH 2.276 06/08/2020: B Natriuretic Peptide 15.0; BUN 11; Creatinine, Ser 1.06; Hemoglobin 13.6; Magnesium 2.0; Platelets 197; Potassium 2.9; Sodium 135   Lipid Panel    Component Value Date/Time   CHOL 142 04/16/2020 0000   TRIG 74 04/16/2020 0000   HDL 48 04/16/2020 0000   CHOLHDL 4.4 03/02/2019 0329   VLDL 18 03/02/2019 0329   LDLCALC 79 04/16/2020 0000    Additional studies/ records that were reviewed today include:   Echocardiogram: 02/2019 IMPRESSIONS    1. The left ventricle has normal systolic function with an ejection fraction of 60-65%. The cavity size was normal. Left ventricular diastolic parameters were normal.  2. The right ventricle has normal systolic function. The cavity was normal. There is no increase in right ventricular wall thickness.  3. Mild thickening of the mitral valve leaflet.  4. The aortic valve is tricuspid. Mild thickening of the aortic valve. Mild calcification of the aortic valve.  5. The aorta is normal in size and structure.   Event Monitor: 03/2019 NSR  No significant arrhythmia No PAF  Myovue 06/25/19:  EF  62% soft tissue attenuation no ischemia   Assessment:    No diagnosis found.   Plan:   In order of problems listed above:  1. Paroxysmal Atrial Fibrillation/Flutter - Event monitor demonstrated no recurrence 04/09/19  but he was hospitalized at the end of 03/2019 for recurrent atrial fibrillation and  spontaneously converted back to NSR. He continues to experience dizziness and I suspect this is not secondary to a cardiac etiology He had no improvement in his dizziness with decreasing his lopressor to 12.5 mg bid  2. HTN - Well controlled.  Continue current medications and low sodium Dash type diet.    3. HLD - followed by PCP. ? Now on Nexlitol previously on statin   4. Dizziness/Chest Pain - he continues to have dizziness which is being worked-up by Neurology and ENT. Had myovue 06/25/19 that was normal With no ischemia , soft tissue attenuation EF 62% low risk study. Do not feel that further cardiac w/u needed at this time . CT head 06/04/20 negative   He has had panic attacks in past and I suspect this is also playing a role in his frequent ER visits   F/u with cards in a year     Signed, Jenkins Rouge, MD  06/15/2020 9:22 AM    [facility]. [address] [place], [place] [address] Phone: [phone] Fax: [phone]

## 2020-06-08 ENCOUNTER — Emergency Department (HOSPITAL_COMMUNITY)
Admission: EM | Admit: 2020-06-08 | Discharge: 2020-06-08 | Disposition: A | Discharge status: Home / Self Care | Payer: BC Managed Care – PPO | Attending: Emergency Medicine | Admitting: Emergency Medicine

## 2020-06-08 ENCOUNTER — Telehealth (HOSPITAL_COMMUNITY): Payer: Self-pay | Admitting: Physical Therapy

## 2020-06-08 ENCOUNTER — Emergency Department (HOSPITAL_COMMUNITY): Payer: BC Managed Care – PPO

## 2020-06-08 ENCOUNTER — Other Ambulatory Visit: Payer: Self-pay

## 2020-06-08 ENCOUNTER — Ambulatory Visit
Admission: EM | Admit: 2020-06-08 | Discharge: 2020-06-08 | Disposition: A | Discharge status: Home / Self Care | Payer: BC Managed Care – PPO | Attending: Emergency Medicine | Admitting: Emergency Medicine

## 2020-06-08 ENCOUNTER — Encounter (HOSPITAL_COMMUNITY): Payer: Self-pay | Admitting: Emergency Medicine

## 2020-06-08 ENCOUNTER — Ambulatory Visit (HOSPITAL_COMMUNITY): Payer: BC Managed Care – PPO | Admitting: Physical Therapy

## 2020-06-08 DIAGNOSIS — R42 Dizziness and giddiness: Secondary | ICD-10-CM

## 2020-06-08 DIAGNOSIS — I10 Essential (primary) hypertension: Secondary | ICD-10-CM | POA: Insufficient documentation

## 2020-06-08 DIAGNOSIS — Z7901 Long term (current) use of anticoagulants: Secondary | ICD-10-CM | POA: Insufficient documentation

## 2020-06-08 DIAGNOSIS — Z1152 Encounter for screening for COVID-19: Secondary | ICD-10-CM | POA: Diagnosis not present

## 2020-06-08 DIAGNOSIS — R0602 Shortness of breath: Secondary | ICD-10-CM

## 2020-06-08 DIAGNOSIS — Z8546 Personal history of malignant neoplasm of prostate: Secondary | ICD-10-CM | POA: Insufficient documentation

## 2020-06-08 DIAGNOSIS — R519 Headache, unspecified: Secondary | ICD-10-CM

## 2020-06-08 DIAGNOSIS — E039 Hypothyroidism, unspecified: Secondary | ICD-10-CM | POA: Insufficient documentation

## 2020-06-08 DIAGNOSIS — I499 Cardiac arrhythmia, unspecified: Secondary | ICD-10-CM

## 2020-06-08 DIAGNOSIS — Z79899 Other long term (current) drug therapy: Secondary | ICD-10-CM | POA: Diagnosis not present

## 2020-06-08 DIAGNOSIS — Z87891 Personal history of nicotine dependence: Secondary | ICD-10-CM | POA: Diagnosis not present

## 2020-06-08 DIAGNOSIS — Z91040 Latex allergy status: Secondary | ICD-10-CM | POA: Insufficient documentation

## 2020-06-08 LAB — BASIC METABOLIC PANEL
Anion gap: 8 (ref 5–15)
BUN: 11 mg/dL (ref 8–23)
CO2: 28 mmol/L (ref 22–32)
Calcium: 9.7 mg/dL (ref 8.9–10.3)
Chloride: 99 mmol/L (ref 98–111)
Creatinine, Ser: 1.06 mg/dL (ref 0.61–1.24)
GFR, Estimated: >60 mL/min (ref >60)
Glucose, Bld: 98 mg/dL (ref 70–99)
Potassium: 2.9 mmol/L — ABNORMAL LOW (ref 3.5–5.1)
Sodium: 135 mmol/L (ref 135–145)

## 2020-06-08 LAB — CBC WITH DIFFERENTIAL/PLATELET
Abs Immature Granulocytes: 0.01 10*3/uL (ref 0.00–0.07)
Basophils Absolute: 0 10*3/uL (ref 0.0–0.1)
Basophils Relative: 1 %
Eosinophils Absolute: 0 10*3/uL (ref 0.0–0.5)
Eosinophils Relative: 1 %
HCT: 41.4 % (ref 39.0–52.0)
Hemoglobin: 13.6 g/dL (ref 13.0–17.0)
Immature Granulocytes: 0 %
Lymphocytes Relative: 34 %
Lymphs Abs: 1.1 10*3/uL (ref 0.7–4.0)
MCH: 29.9 pg (ref 26.0–34.0)
MCHC: 32.9 g/dL (ref 30.0–36.0)
MCV: 91 fL (ref 80.0–100.0)
Monocytes Absolute: 0.3 10*3/uL (ref 0.1–1.0)
Monocytes Relative: 8 %
Neutro Abs: 1.9 10*3/uL (ref 1.7–7.7)
Neutrophils Relative %: 56 %
Platelets: 197 10*3/uL (ref 150–400)
RBC: 4.55 MIL/uL (ref 4.22–5.81)
RDW: 11.9 % (ref 11.5–15.5)
WBC: 3.4 10*3/uL — ABNORMAL LOW (ref 4.0–10.5)
nRBC: 0 % (ref 0.0–0.2)

## 2020-06-08 LAB — BRAIN NATRIURETIC PEPTIDE: B Natriuretic Peptide: 15 pg/mL (ref 0.0–100.0)

## 2020-06-08 LAB — TROPONIN I (HIGH SENSITIVITY)
Troponin I (High Sensitivity): 37 ng/L — ABNORMAL HIGH (ref <18)
Troponin I (High Sensitivity): 39 ng/L — ABNORMAL HIGH (ref <18)

## 2020-06-08 LAB — MAGNESIUM: Magnesium: 2 mg/dL (ref 1.7–2.4)

## 2020-06-08 LAB — D-DIMER, QUANTITATIVE: D-Dimer, Quant: <0.27 ug/mL-FEU (ref 0.00–0.50)

## 2020-06-08 MED ORDER — ONDANSETRON 4 MG PO TBDP: 4.0000 mg | ORAL_TABLET | Freq: Three times a day (TID) | ORAL | 0 refills | Status: DC | PRN

## 2020-06-08 MED ORDER — SODIUM CHLORIDE 0.9 % IV SOLN: INTRAVENOUS | Status: DC

## 2020-06-08 MED ORDER — POTASSIUM CHLORIDE CRYS ER 20 MEQ PO TBCR
40.0000 meq | EXTENDED_RELEASE_TABLET | Freq: Once | ORAL | Status: AC
  Administered 2020-06-08: 40 meq via ORAL
  Filled 2020-06-08: qty 2

## 2020-06-08 NOTE — ED Provider Notes (Signed)
[place]   [id number] 06/08/20 Arrival Time: 1053  WJ:[id number]  SUBJECTIVE:  Steven [name] is a 69 y.o. male who presented to the urgent care with a complaint of headache, and nausea for the past 1 week.  Patient localizes his pain to the generalized head.  Describes the pain as constant and achy in character.  Patient has tried OTC Tylenol with mild relief.  He was seen at the ER on 06/05/19 with a negative CT scan.  He was discharged with no medication.  He states  COVID-19 test was not done for rule out.  Denies aggravating or worsening symptoms.  Denies similar symptoms in the past. This is not the worst headache of his life.    Denies fever, chills, nausea, vomiting, aura, rhinorrhea, watery eyes, chest pain, SOB, abdominal pain, weakness, numbness or tingling, slurred speech.     ROS: As per HPI.  All other pertinent ROS negative.      Past Medical History:  Diagnosis Date   Arthritis    Atrial fibrillation (HCC)    Atrial flutter (HCC)    BPH (benign prostatic hyperplasia)    Chest pain    Dizziness    Dyspnea    laying down occ   Fracture 08/17/2015   MULTIPLE RIB FRACTURES     FROM FALL    GERD (gastroesophageal reflux disease)    Hemorrhoids    History of kidney stones    noted on CT scan   History of radiation therapy 08/22/11-10/13/11   prostate   Hyperlipemia    Hypertension    Hyperthyroidism    Light headedness    Numbness and tingling in left arm    Numbness and tingling of both legs    Prostate cancer [facility])    prostate s/p radiation Mar 2013   Past Surgical History:  Procedure Laterality Date   BOTOX INJECTION N/A 01/14/2020   Procedure: INJECTION OF BOTOX INTO ANAL SPHINCTER;  Surgeon: Ileana Roup, MD;  Location: [facility];  Service: General;  Laterality: N/A;   COLONOSCOPY N/A 12/19/2012   FAO:[id number] 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 Normal  appearing colon   COLONOSCOPY WITH PROPOFOL N/A 10/23/2019   Procedure: COLONOSCOPY WITH PROPOFOL;  Surgeon: Daneil Dolin, MD; external and grade 2 internal hemorrhoids, abnormal rectal blood vessels consistent with radiation proctitis s/p APC therapy, otherwise normal exam.   EVALUATION UNDER ANESTHESIA WITH ANAL FISTULECTOMY N/A 01/14/2020   Procedure: ANORECTAL EXAM UNDER ANESTHESIA;  Surgeon: Ileana Roup, MD;  Location: [facility];  Service: General;  Laterality: N/A;   HOT HEMOSTASIS  10/23/2019   Procedure: HOT HEMOSTASIS (ARGON PLASMA COAGULATION/BICAP);  Surgeon: Daneil Dolin, MD;  [facility];  Service: Endoscopy;;  apc rectal proctitis     KIDNEY SURGERY  1982   kidney tube collapse repair   RADIOACTIVE SEED IMPLANT     Prostate   Allergies  Allergen Reactions   Latex Rash    Possible reaction to latex gloves per patient   No current facility-administered medications on file prior to encounter.   Current Outpatient Medications on File Prior to Encounter  Medication Sig Dispense Refill   alfuzosin (UROXATRAL) 10 MG 24 hr tablet Take 1 tablet (10 mg total) by mouth daily with breakfast. 90 tablet 3   amLODipine (NORVASC) 5 MG tablet Take 5 mg by mouth daily.     Cholecalciferol (VITAMIN D3) 125 MCG (5000 UT) CAPS Take  1 capsule by mouth.      Coenzyme Q10 (COQ10) 100 MG CAPS Take 100 mg by mouth.      Cyanocobalamin (B-12 PO) Take 1 tablet by mouth.      diazepam (VALIUM) 5 MG tablet Take 5 mg by mouth daily as needed for anxiety.      diclofenac (CATAFLAM) 50 MG tablet 2 times daily.     ELIQUIS 5 MG TABS tablet TAKE 1 TABLET(5 MG) BY MOUTH TWICE DAILY 60 tablet 5   gabapentin (NEURONTIN) 300 MG capsule Take 300 mg by mouth 3 (three) times daily.     hydrochlorothiazide (HYDRODIURIL) 25 MG tablet Take 25 mg by mouth daily.     metoprolol tartrate (LOPRESSOR) 25 MG tablet Take 1 tablet (25 mg total) by mouth 2 (two) times daily. 60 tablet 2     NEXLETOL 180 MG TABS Take 180 mg by mouth daily. Pt taking 2-3 times a week     pantoprazole (PROTONIX) 40 MG tablet TAKE 1 TABLET BY MOUTH DAILY 30 MINUTES BEFORE BREAKFAST 90 tablet 3   polyethylene glycol powder (GLYCOLAX/MIRALAX) 17 GM/SCOOP powder Take by mouth.     potassium chloride SA (KLOR-CON) 20 MEQ tablet Take 20 mEq by mouth 2 (two) times daily.     Probiotic Product (PROBIOTIC-10 PO) Take 1 capsule by mouth daily.     topiramate (TOPAMAX) 100 MG tablet Take 50 mg by mouth at bedtime.      traMADol (ULTRAM) 50 MG tablet tramadol 50 mg tablet  TAKE 1 TABLET BY MOUTH EVERY 6 HOURS AS NEEDED FOR UP TO 5 DAYS FOR SEVERE POSTOP PAIN NOT CONTROLLED WITH TYLENO/IBUPROFEN     VITAMIN E PO Take 1 tablet by mouth.      zinc gluconate 50 MG tablet Take 50 mg by mouth.      Social History   Socioeconomic History   Marital status: Single    Spouse name: Not on file   Number of children: Not on file   Years of education: Not on file   Highest education level: Not on file  Occupational History   Occupation: Custodian     Employer: [employer]  Tobacco Use   Smoking status: Former Smoker    Packs/day: 1.00    Years: 20.00    Pack years: 20.00    Types: Cigarettes    Quit date: 2020    Years since quitting: 1.8   Smokeless tobacco: Former Systems developer   Tobacco comment: Quit smoking x 2 years    07/2015   SOMETIIMES i USE VAPOR   Vaping Use   Vaping Use: Never used  Substance and Sexual Activity   Alcohol use: Never   Drug use: Never   Sexual activity: Not Currently  Other Topics Concern   Not on file  Social History Narrative   Divorced   Social Determinants of Health   Financial Resource Strain:    Difficulty of Paying Living Expenses: Not on file  Food Insecurity:    Worried About Charity fundraiser in the Last Year: Not on file   [place] in the Last Year: Not on file  Transportation Needs:    Lack of Transportation (Medical):  Not on file   Lack of Transportation (Non-Medical): Not on file  Physical Activity:    Days of Exercise per Week: Not on file   Minutes of Exercise per Session: Not on file  Stress:    Feeling of Stress : Not  on file  Social Connections:    Frequency of Communication with Friends and Family: Not on file   Frequency of Social Gatherings with Friends and Family: Not on file   Attends Religious Services: Not on file   Active Member of Clubs or Organizations: Not on file   Attends Archivist Meetings: Not on file   Marital Status: Not on file  Intimate Partner Violence:    Fear of Current or Ex-Partner: Not on file   Emotionally Abused: Not on file   Physically Abused: Not on file   Sexually Abused: Not on file   Family History  Problem Relation Age of Onset   Aneurysm Mother    Hypertension Mother    Prostate cancer Father    Hypertension Father    Colon cancer Neg Hx    Colon polyps Neg Hx     OBJECTIVE:  Vitals:   06/08/20 1115 06/08/20 1119  BP:  106/66  Pulse:  (!) 54  Temp:  (!) 96.5 F (35.8 C)  TempSrc:  Temporal  SpO2:  97%  Weight: 176 lb (79.8 kg)   Height: 5\' 6"  (1.676 m)     General appearance: alert; no distress Eyes: PERRLA; EOMI HENT: normocephalic; atraumatic Neck: supple with FROM Lungs: clear to auscultation bilaterally Heart: regular rate and rhythm.  Radial pulses 2+ symmetrical bilaterally Extremities: no edema; symmetrical with no gross deformities Skin: warm and dry Neurologic: CN 2-12 grossly intact; finger to nose without difficulty; normal gait; strength and sensation intact bilaterally about the upper and lower extremities; negative pronator drift Psychological: alert and cooperative; normal mood and affect   ASSESSMENT & PLAN:  1. Acute nonintractable headache, unspecified headache type   2. Encounter for screening for COVID-19     Meds ordered this encounter  Medications   ondansetron (ZOFRAN ODT)  4 MG disintegrating tablet    Sig: Take 1 tablet (4 mg total) by mouth every 8 (eight) hours as needed for nausea or vomiting.    Dispense:  30 tablet    Refill:  0   Discharge instruction   COVID-19 test was completed.  Will take 2 to 7 days for results to return.  Someone will call if your result is positive.  Continue to take Tylenol as needed for pain Zofran was prescribed/take as directed Rest and drink plenty of fluids Use OTC medications as needed for symptomatic relief Follow up with PCP if symptoms persists Return or go to the ER if you have any new or worsening symptoms such as fever, chills, nausea, vomiting, chest pain, shortness of breath, cough, vision changes, worsening headache despite treatment, slurred speech, facial asymmetry, weakness in arms or legs, etc...  Reviewed expectations re: course of current medical issues. Questions answered. Outlined signs and symptoms indicating need for more acute intervention. Patient verbalized understanding. After Visit Summary given.   Emerson Monte, [place] 06/08/20 1210

## 2020-06-08 NOTE — Telephone Encounter (Signed)
pt cancelled appt because he is not feeling well

## 2020-06-08 NOTE — ED Triage Notes (Signed)
Pt states that he has been having headaches that make him nauseated and light headed x1 week. Pt states that he is vaccinated.

## 2020-06-08 NOTE — ED Provider Notes (Signed)
[facility] EMERGENCY DEPARTMENT Provider Note   CSN: [id number] Arrival date & time: 06/08/20  1530     History Chief Complaint  Patient presents with  . Shortness of Breath    Steven [name] is a 69 y.o. male with PMH significant for insomnia, chronic paresthesia of upper limbs, chronic intractable migraine without aura, A. fib on Eliquis, BPH, prostate cancer status post radiation in 2013, GERD, hyperlipidemia, hypertension, hypothyroidism presenting with shortness of breath and dizziness after blowing leaves in his yard.  He notes that he was fine while doing the leaves for 1-2 hours but then developed "a flutter feeling in his chest" and got short of breath and dizzy. He notes that this similar thing happened yesterday after he sneezed a few times. It lasts about 20-30 seconds and then improves. Denies any chest pain with these events or with exertion. He endorses chronic intermittent episodes of dizziness when he walks. Denies any nausea or vomiting. Notes that he was diagnosed with a-fib last year. Has intermittent moments of arrhythmia, but recently this has been happening more often and with more severe symptoms. He also endorses these headaches that are become more frequent and severe. They do respond well to tylenol. Denies any focal weakness, slurred speech, or facial drooping. Follows with neurology who is in the process of working this up and treating.   He was seen in urgent care on 11/12 for headache. Head CT was negative at that time. He was also seen in UC today for headache and was treated with tylenol and zofran and obtained covid testing.   Denies any infectious symptoms. Unsure if she snores although endores poor sleep at night. Sleeps on 2 pillows but denies difficulty breathing when lying flat or symptoms concerning for PND.     Past Medical History:  Diagnosis Date  . Arthritis   . Atrial fibrillation ([place])   . Atrial flutter ([place])   . BPH (benign prostatic  hyperplasia)   . Chest pain   . Dizziness   . Dyspnea    laying down occ  . Fracture 08/17/2015   MULTIPLE RIB FRACTURES     FROM FALL   . GERD (gastroesophageal reflux disease)   . Hemorrhoids   . History of kidney stones    noted on CT scan  . History of radiation therapy 08/22/11-10/13/11   prostate  . Hyperlipemia   . Hypertension   . Hyperthyroidism   . Light headedness   . Numbness and tingling in left arm   . Numbness and tingling of both legs   . Prostate cancer [facility])    prostate s/p radiation Mar 2013    Patient Active Problem List   Diagnosis Date Noted  . Perineal pain 03/02/2020  . Subacute thyroiditis 01/07/2020  . Personal history of malignant neoplasm of prostate 12/12/2019  . Benign prostatic hyperplasia with urinary obstruction 12/12/2019  . Weak urinary stream 12/12/2019  . Hyperthyroidism 11/24/2019  . Weight loss 10/21/2019  . IBS (irritable bowel syndrome) 10/21/2019  . Rectal pain 08/21/2019  . Nausea without vomiting 05/30/2019  . Atrial flutter ([place]) 04/17/2019  . Atrial flutter, paroxysmal ([place]) 03/02/2019  . HTN (hypertension) 03/02/2019  . Elevated troponin 03/01/2019  . Hypokalemia 03/01/2019  . Constipation 08/02/2017  . Hemorrhoids 08/02/2017  . Fall 08/18/2015  . Open fracture of left elbow 08/18/2015  . Rib fractures 08/17/2015  . Radiation proctitis 01/30/2013  . Rectal bleeding 12/10/2012  . Chest pain 05/30/2012  . Elevated lipids 05/30/2012  .  Palpitations 05/30/2012  . Dyspnea 05/30/2012  . Obese 05/30/2012  . Prostate cancer ([place]) 08/13/2011    Past Surgical History:  Procedure Laterality Date  . BOTOX INJECTION N/A 01/14/2020   Procedure: INJECTION OF BOTOX INTO ANAL SPHINCTER;  Surgeon: Ileana Roup, MD;  Location: [facility];  Service: General;  Laterality: N/A;  . COLONOSCOPY N/A 12/19/2012   ALP:[id number] 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 Normal  appearing colon  . COLONOSCOPY WITH PROPOFOL N/A 10/23/2019   Procedure: COLONOSCOPY WITH PROPOFOL;  Surgeon: Daneil Dolin, MD; external and grade 2 internal hemorrhoids, abnormal rectal blood vessels consistent with radiation proctitis s/p APC therapy, otherwise normal exam.  . EVALUATION UNDER ANESTHESIA WITH ANAL FISTULECTOMY N/A 01/14/2020   Procedure: ANORECTAL EXAM UNDER ANESTHESIA;  Surgeon: Ileana Roup, MD;  Location[facility];  Service: General;  Laterality: N/A;  . HOT HEMOSTASIS  10/23/2019   Procedure: HOT HEMOSTASIS (ARGON PLASMA COAGULATION/BICAP);  Surgeon: Daneil Dolin, MD;  [facility];  Service: Endoscopy;;  apc rectal proctitis    . KIDNEY SURGERY  1982   kidney tube collapse repair  . RADIOACTIVE SEED IMPLANT     Prostate       Family History  Problem Relation Age of Onset  . Aneurysm Mother   . Hypertension Mother   . Prostate cancer Father   . Hypertension Father   . Colon cancer Neg Hx   . Colon polyps Neg Hx     Social History   Tobacco Use  . Smoking status: Former Smoker    Packs/day: 1.00    Years: 20.00    Pack years: 20.00    Types: Cigarettes    Quit date: 2020    Years since quitting: 1.8  . Smokeless tobacco: Former Systems developer  . Tobacco comment: Quit smoking x 2 years    07/2015   SOMETIIMES i USE VAPOR   Vaping Use  . Vaping Use: Never used  Substance Use Topics  . Alcohol use: Never  . Drug use: Never    Home Medications Prior to Admission medications   Medication Sig Start Date End Date Taking? Authorizing Provider  alfuzosin (UROXATRAL) 10 MG 24 hr tablet Take 1 tablet (10 mg total) by mouth daily with breakfast. 03/31/20   [name], Candee Furbish, MD  amLODipine (NORVASC) 5 MG tablet Take 5 mg by mouth daily. 01/01/20   [provider]  Cholecalciferol (VITAMIN D3) 125 MCG (5000 UT) CAPS Take 1 capsule by mouth.     [provider]  Coenzyme Q10 (COQ10) 100 MG CAPS Take 100 mg by mouth.     [provider]  Cyanocobalamin (B-12 PO) Take 1 tablet by mouth.     [provider]  diazepam (VALIUM) 5 MG tablet Take 5 mg by mouth daily as needed for anxiety.  05/13/19   [provider]  diclofenac (CATAFLAM) 50 MG tablet 2 times daily.    [provider]  ELIQUIS 5 MG TABS tablet TAKE 1 TABLET(5 MG) BY MOUTH TWICE DAILY 03/08/20   Josue Hector, MD  gabapentin (NEURONTIN) 300 MG capsule Take 300 mg by mouth 3 (three) times daily. 02/24/20   [provider]  hydrochlorothiazide (HYDRODIURIL) 25 MG tablet Take 25 mg by mouth daily. 07/28/19   [provider]  metoprolol tartrate (LOPRESSOR) 25 MG tablet Take 1 tablet (25 mg total) by mouth 2 (two) times daily. 04/18/19   Emokpae,  Courage, MD  NEXLETOL 180 MG TABS Take 180 mg by mouth daily. Pt taking 2-3 times a week 12/03/19   [provider]  ondansetron (ZOFRAN ODT) 4 MG disintegrating tablet Take 1 tablet (4 mg total) by mouth every 8 (eight) hours as needed for nausea or vomiting. 06/08/20   Avegno, Darrelyn Hillock, FNP  pantoprazole (PROTONIX) 40 MG tablet TAKE 1 TABLET BY MOUTH DAILY 30 MINUTES BEFORE BREAKFAST 05/11/20   Annitta Needs, NP  polyethylene glycol powder (GLYCOLAX/MIRALAX) 17 GM/SCOOP powder Take by mouth.    [provider]  potassium chloride SA (KLOR-CON) 20 MEQ tablet Take 20 mEq by mouth 2 (two) times daily. 12/09/19   [provider]  Probiotic Product (PROBIOTIC-10 PO) Take 1 capsule by mouth daily.    [provider]  topiramate (TOPAMAX) 100 MG tablet Take 50 mg by mouth at bedtime.  09/04/19   [provider]  traMADol (ULTRAM) 50 MG tablet tramadol 50 mg tablet  TAKE 1 TABLET BY MOUTH EVERY 6 HOURS AS NEEDED FOR UP TO 5 DAYS FOR SEVERE POSTOP PAIN NOT CONTROLLED WITH TYLENO/IBUPROFEN    [provider]  VITAMIN E PO Take 1 tablet by mouth.     [provider]  zinc gluconate 50 MG tablet Take 50 mg by mouth.      [provider]    Allergies    Latex  Review of Systems   Review of Systems  Constitutional: Negative for chills, diaphoresis and fever.  HENT: Negative for congestion, postnasal drip, rhinorrhea, sinus pressure and sore throat.   Respiratory: Positive for shortness of breath. Negative for cough, chest tightness and wheezing.   Cardiovascular: Positive for palpitations. Negative for chest pain.  Gastrointestinal: Negative for abdominal pain, constipation, diarrhea, nausea and vomiting.  Neurological: Positive for dizziness, light-headedness and headaches. Negative for seizures, facial asymmetry, speech difficulty and weakness.    Physical Exam Updated Vital Signs BP (!) 150/90   Pulse 62   Temp 98.3 F (36.8 C) (Oral)   Resp (!) 21   Ht 5\' 6"  (1.676 m)   Wt 79.8 kg   SpO2 100%   BMI 28.41 kg/m   Physical Exam Constitutional:      General: He is not in acute distress.    Appearance: He is well-developed. He is not ill-appearing, toxic-appearing or diaphoretic.  HENT:     Head: Normocephalic and atraumatic.  Cardiovascular:     Rate and Rhythm: Regular rhythm. Bradycardia present.     Pulses: Normal pulses.     Heart sounds: Normal heart sounds. No murmur heard.   Pulmonary:     Effort: Pulmonary effort is normal.     Breath sounds: Normal breath sounds. No decreased breath sounds.  Abdominal:     General: Bowel sounds are normal.     Palpations: Abdomen is soft.  Musculoskeletal:     Right lower leg: No edema.     Left lower leg: No edema.  Skin:    General: Skin is warm and dry.  Neurological:     Mental Status: He is alert.     ED Results / Procedures / Treatments   Labs (all labs ordered are listed, but only abnormal results are displayed) Labs Reviewed  CBC WITH DIFFERENTIAL/PLATELET - Abnormal; Notable for the following components:      Result Value   WBC 3.4 (*)    All other components within normal limits  BASIC METABOLIC PANEL -  Abnormal; Notable for the  following components:   Potassium 2.9 (*)    All other components within normal limits  TROPONIN I (HIGH SENSITIVITY) - Abnormal; Notable for the following components:   Troponin I (High Sensitivity) 37 (*)    All other components within normal limits  TROPONIN I (HIGH SENSITIVITY) - Abnormal; Notable for the following components:   Troponin I (High Sensitivity) 39 (*)    All other components within normal limits  MAGNESIUM  D-DIMER, QUANTITATIVE (NOT AT [facility])  BRAIN NATRIURETIC PEPTIDE    EKG None  Radiology DG Chest 2 View  Result Date: 06/08/2020 CLINICAL DATA:  Shortness of breath and dizziness. EXAM: CHEST - 2 VIEW COMPARISON:  04/17/2019 FINDINGS: Normal sized heart. Mildly tortuous and calcified thoracic aorta. Clear lungs with normal vascularity. Thoracic spine degenerative changes and mild scoliosis. IMPRESSION: No acute abnormality. Electronically Signed   By: Claudie Revering M.D.   On: 06/08/2020 16:34    Procedures Procedures (including critical care time)  Medications Ordered in ED Medications  potassium chloride SA (KLOR-CON) CR tablet 40 mEq (40 mEq Oral Given 06/08/20 1948)    ED Course  MDM Rules/Calculators/A&P I have reviewed the triage vital signs and the nursing notes.  Pertinent labs & imaging results that were available during my care of the patient were reviewed by me and considered in my medical decision making (see chart for details).  Steven [name] is a 69 y.o. male with PMH significant for insomnia, chronic paresthesia of upper limbs, chronic intractable migraine without aura, A. fib on Eliquis, BPH, prostate cancer status post radiation in 2013, GERD, hyperlipidemia, hypertension, hypothyroidism presenting with 2 episodes of palpitations, heart flutters, SOB and dizziness after activity that lasted 20-30 seconds, then self resolved. No associated chest pain or focal deficits.   On presentation he is afebrile and  hemodynamically stable on room air with resolution of symptoms. He initially had soft blood pressures of 105/68, P 74, and 100% O2 sats on room air. Physical exam unremarkable.    Work up included EKG notable for sinus arrhythmia but no signs of ischemia. BNP of 15, D-dimer negative. BMP with K of 2.9 (chronic, on home Kdur 20mg  BID) and Mag of 2.0. CBC normal. Trop trended flat at 37>39.  CXR clear.   Patient's potassium was repleted. Suspect his symptoms are secondary to paroxysmal arrhythmia given his known a-fib. Heart failure, PE, infection have been ruled out.   Discussed results with patient. Recommended close follow up with cardiology for further evaluation. Patient is established with cardiologist on the outpatient basis.  At this time, patient is stable and safe for discharge home with close follow up with cardiology. Patient should continue his home potassium supplement as prescribed and follow up with his PCP further monitoring.    Final Clinical Impression(s) / ED Diagnoses Final diagnoses:  Cardiac arrhythmia, unspecified cardiac arrhythmia type  Shortness of breath  Lightheadedness    Rx / DC Orders ED Discharge Orders    None       Danna Hefty, DO 06/08/20 2138    Elnora Morrison, MD 06/12/20 [phone]

## 2020-06-08 NOTE — Discharge Instructions (Addendum)
COVID-19 test was completed.  Will take 2 to 7 days for results to return.  Someone will call if your result is positive.  Continue to take Tylenol as needed for pain Zofran was prescribed/take as directed Rest and drink plenty of fluids Use OTC medications as needed for symptomatic relief Follow up with PCP if symptoms persists Return or go to the ER if you have any new or worsening symptoms such as fever, chills, nausea, vomiting, chest pain, shortness of breath, cough, vision changes, worsening headache despite treatment, slurred speech, facial asymmetry, weakness in arms or legs, etc..Marland Kitchen

## 2020-06-08 NOTE — Discharge Instructions (Addendum)
I suspect your symptoms are from your abnormal heart rate (arrhythmia) causing you to have symptoms.You lab work and imaging were all reassuring for something very serious.  I highly recommend you follow up with your cardiologist for further evaluation.  Please be sure to continue your Eliquis and potassium supplement as prescribed. Please follow up with your family doctor for close monitoring.

## 2020-06-08 NOTE — ED Notes (Signed)
Date and time results received: 06/08/20 6:41 PM    Test: Troponin Critical Value: 37  Name of Provider Notified: Dr. Reather Converse  Orders Received? Or Actions Taken?: see orders

## 2020-06-08 NOTE — ED Triage Notes (Signed)
Pt states he was blowing leaves and he became short of breath, and dizzy. Denies pain.

## 2020-06-09 ENCOUNTER — Telehealth: Payer: Self-pay | Admitting: Cardiovascular Disease

## 2020-06-09 NOTE — Telephone Encounter (Signed)
Pt states he was seen yesterday for headache. On yesterday he was working in the yard and started having chest pain, SOB. Pt was seen in the ED and told he has low potassium. Pt states that he has been dizzy since he was dx with afib. Please advise

## 2020-06-09 NOTE — Telephone Encounter (Signed)
New message     Pt c/o of Chest Pain: STAT if CP now or developed within 24 hours  1. Are you having CP right now? no  2. Are you experiencing any other symptoms (ex. SOB, nausea, vomiting, sweating)? Severe headache, dizziness   3. How long have you been experiencing CP? Comes and goes was in ER yesterday was out in yard working and he got a severe chest pain and he fell to his knees , went to ER   4. Is your CP continuous or coming and going? Comes and goes   5. Have you taken Nitroglycerin? no ?   Today his heart rate fills like its going up and down, heart beat was rapid and he was dizzy and weak , having a severe headache again today

## 2020-06-09 NOTE — Telephone Encounter (Signed)
He always has a myriad of symptoms mostly non cardiac should f/u with primary for these His heart has been fine

## 2020-06-10 ENCOUNTER — Telehealth (HOSPITAL_COMMUNITY): Payer: Self-pay

## 2020-06-10 ENCOUNTER — Ambulatory Visit (HOSPITAL_COMMUNITY): Payer: BC Managed Care – PPO

## 2020-06-10 LAB — NOVEL CORONAVIRUS, NAA: SARS-CoV-2, NAA: NOT DETECTED

## 2020-06-10 LAB — SARS-COV-2, NAA 2 DAY TAT

## 2020-06-10 NOTE — Telephone Encounter (Signed)
pt cancelled appt because he is at the MD office

## 2020-06-10 NOTE — Telephone Encounter (Signed)
Spoke with patient, he has an apt next week with Dr.Nishan and will discuss issues then.

## 2020-06-14 ENCOUNTER — Encounter (HOSPITAL_COMMUNITY): Payer: BC Managed Care – PPO

## 2020-06-15 ENCOUNTER — Encounter: Payer: Self-pay | Admitting: Cardiovascular Disease

## 2020-06-15 ENCOUNTER — Other Ambulatory Visit: Payer: Self-pay

## 2020-06-15 ENCOUNTER — Ambulatory Visit: Payer: BC Managed Care – PPO | Admitting: Cardiovascular Disease

## 2020-06-15 ENCOUNTER — Ambulatory Visit (INDEPENDENT_AMBULATORY_CARE_PROVIDER_SITE_OTHER): Payer: BC Managed Care – PPO | Admitting: Cardiovascular Disease

## 2020-06-15 VITALS — BP 116/70 | HR 58 | Ht 66.0 in | Wt 176.0 lb

## 2020-06-15 DIAGNOSIS — I480 Paroxysmal atrial fibrillation: Secondary | ICD-10-CM

## 2020-06-15 NOTE — Patient Instructions (Signed)
Medication Instructions:  Your physician recommends that you continue on your current medications as directed. Please refer to the Current Medication list given to you today.  *If you need a refill on your cardiac medications before your next appointment, please call your pharmacy*   Lab Work: None today If you have labs (blood work) drawn today and your tests are completely normal, you will receive your results only by: . MyChart Message (if you have MyChart) OR . A paper copy in the mail If you have any lab test that is abnormal or we need to change your treatment, we will call you to review the results.   Testing/Procedures: None today   Follow-Up: At [facility], you and your health needs are our priority.  As part of our continuing mission to provide you with exceptional heart care, we have created designated Provider Care Teams.  These Care Teams include your primary Cardiologist (physician) and Advanced Practice Providers (APPs -  Physician Assistants and Nurse Practitioners) who all work together to provide you with the care you need, when you need it.  We recommend signing up for the patient portal called "MyChart".  Sign up information is provided on this After Visit Summary.  MyChart is used to connect with patients for Virtual Visits (Telemedicine).  Patients are able to view lab/test results, encounter notes, upcoming appointments, etc.  Non-urgent messages can be sent to your provider as well.   To learn more about what you can do with MyChart, go to [email or web address].    Your next appointment:   12 month(s)  The format for your next appointment:   In Person  Provider:   Peter Nishan, MD   Other Instructions None      Thank you for choosing [facility] !         

## 2020-06-16 ENCOUNTER — Ambulatory Visit (HOSPITAL_COMMUNITY): Payer: BC Managed Care – PPO | Admitting: Physical Therapy

## 2020-06-16 DIAGNOSIS — R29898 Other symptoms and signs involving the musculoskeletal system: Secondary | ICD-10-CM

## 2020-06-16 DIAGNOSIS — M542 Cervicalgia: Secondary | ICD-10-CM

## 2020-06-16 NOTE — Therapy (Signed)
[place], [place], [address] Phone: [phone]   Fax:  [phone]  Physical Therapy Treatment  Patient Details  Name: Steven [name] MRN: [id number] Date of Birth: May 09, 1951 Referring Provider (PT): Duffy Rhody MD   Encounter Date: 06/16/2020   PT End of Session - 06/16/20 1115    Visit Number 2    Number of Visits 8    Date for PT Re-Evaluation 06/28/20    Authorization Type Primary BCBS Secondary Medicare    PT Start Time 1100    PT Stop Time 1130    PT Time Calculation (min) 30 min    Activity Tolerance Patient tolerated treatment well    Behavior During Therapy [facility] for tasks assessed/performed           Past Medical History:  Diagnosis Date  . Arthritis   . Atrial fibrillation ([place])   . Atrial flutter ([place])   . BPH (benign prostatic hyperplasia)   . Chest pain   . Dizziness   . Dyspnea    laying down occ  . Fracture 08/17/2015   MULTIPLE RIB FRACTURES     FROM FALL   . GERD (gastroesophageal reflux disease)   . Hemorrhoids   . History of kidney stones    noted on CT scan  . History of radiation therapy 08/22/11-10/13/11   prostate  . Hyperlipemia   . Hypertension   . Hyperthyroidism   . Light headedness   . Numbness and tingling in left arm   . Numbness and tingling of both legs   . Prostate cancer [facility])    prostate s/p radiation Mar 2013    Past Surgical History:  Procedure Laterality Date  . BOTOX INJECTION N/A 01/14/2020   Procedure: INJECTION OF BOTOX INTO ANAL SPHINCTER;  Surgeon: Ileana Roup, MD;  Location: [facility];  Service: General;  Laterality: N/A;  . COLONOSCOPY N/A 12/19/2012   IRS:WNIOEV 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 Normal appearing colon  . COLONOSCOPY WITH PROPOFOL N/A 10/23/2019   Procedure: COLONOSCOPY WITH PROPOFOL;  Surgeon: Daneil Dolin, MD; external and grade 2 internal  hemorrhoids, abnormal rectal blood vessels consistent with radiation proctitis s/p APC therapy, otherwise normal exam.  . EVALUATION UNDER ANESTHESIA WITH ANAL FISTULECTOMY N/A 01/14/2020   Procedure: ANORECTAL EXAM UNDER ANESTHESIA;  Surgeon: Ileana Roup, MD;  Location: [facility];  Service: General;  Laterality: N/A;  . HOT HEMOSTASIS  10/23/2019   Procedure: HOT HEMOSTASIS (ARGON PLASMA COAGULATION/BICAP);  Surgeon: Daneil Dolin, MD;  Location: [facility] SU[facility];  Service: Endoscopy;;  apc rectal proctitis    . KIDNEY SURGERY  1982   kidney tube collapse repair  . RADIOACTIVE SEED IMPLANT     Prostate    There were no vitals filed for this visit.   Subjective Assessment - 06/16/20 1106    Subjective PT arrived 15 minutes late.  Reports he has been having medical issues and this is why he has not returned in over 2 weeks.  STates his neck has been good but he's having "crunching" in his lower back and weakness.    Currently in Pain? No/denies                             [facility] Adult PT Treatment/Exercise - 06/16/20 0001      Exercises   Exercises Neck  Neck Exercises: Seated   Neck Retraction 5 secs;10 reps    Cervical Rotation 5 reps    Lateral Flexion 5 reps    Other Seated Exercise thoracic excursions with UE movements                  PT Education - 06/16/20 1113    Education Details Goals, HEP and POC moving forward.  Postural education.    Person(s) Educated Patient    Methods Explanation    Comprehension Verbalized understanding            PT Short Term Goals - 05/31/20 1140      PT SHORT TERM GOAL #1   Title Patient will be independent with HEP in order to improve functional outcomes.    Time 2    Period Weeks    Status New    Target Date 06/14/20      PT SHORT TERM GOAL #2   Title Patient will report at least 25% improvement in symptoms for improved quality of life.    Time 2    Period Weeks    Status New    Target Date  06/14/20             PT Long Term Goals - 05/31/20 1140      PT LONG TERM GOAL #1   Title Patient will report at least 75% improvement in symptoms for improved quality of life.    Time 4    Period Weeks    Status New    Target Date 06/28/20      PT LONG TERM GOAL #2   Title Patient will be able to sleep with centralized UE symptoms no greater than 2/10 for improved quality of rest.    Time 4    Period Weeks    Status New    Target Date 06/28/20      PT LONG TERM GOAL #3   Title Patient will demonstrate at least 25% improvement in cervical ROM in all restricted planes for improved ability to move head while at completing ADL.    Time 4    Period Weeks    Status New    Target Date 06/28/20                 Plan - 06/16/20 1131    Clinical Impression Statement Pt arrived late and has missed past 2 weeks of therapy due to other medical issues.  Reports non compliance with exercise given for HEP.  States he rarely has neck pain at this point but still with occasional pain that runs down into Lt UE.  Appears to be more concerned with his lumbar region at this point; recommended return to MD regarding this.  FOTO completed with 68% functional status.  Postural education completed as well as goal review and POC moving forward.  PT given thoracic exursions for HEP.    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/Uncomplicated    Rehab Potential Fair    PT Frequency 2x / week    PT Duration 4 weeks    PT Treatment/Interventions Patient/family education;Therapeutic exercise;Manual techniques;ADLs/Self Care Home Management;Aquatic Therapy;Cryotherapy;Electrical  Stimulation;Iontophoresis 4mg /ml Dexamethasone;Moist Heat;Traction;Ultrasound;Stair training;DME Instruction;Functional mobility training;Gait training;Therapeutic activities;Balance training;Neuromuscular re-education;Orthotic Fit/Training;Dry needling;Energy conservation;Splinting;Spinal Manipulations;Joint Manipulations    PT Next Visit Plan Possibly begin manual for pain/ mobility to  cervical spine, postural strengtheing, cervcial and thoracic mobility,  transition to HEP as able with possible decrease to 1x/week due to lack of transporation    PT Home Exercise Plan 11/8 cervical retraction in supine   11/24:  Thoracic excursions    Consulted and Agree with Plan of Care Patient           Patient will benefit from skilled therapeutic intervention in order to improve the following deficits and impairments:  Pain, Increased muscle spasms, Decreased activity tolerance, Decreased mobility, Decreased range of motion, Hypomobility, Improper body mechanics, Postural dysfunction, Decreased endurance, Decreased strength  Visit Diagnosis: Other symptoms and signs involving the musculoskeletal system  Cervicalgia     Problem List Patient Active Problem List   Diagnosis Date Noted  . Perineal pain 03/02/2020  . Subacute thyroiditis 01/07/2020  . Personal history of malignant neoplasm of prostate 12/12/2019  . Benign prostatic hyperplasia with urinary obstruction 12/12/2019  . Weak urinary stream 12/12/2019  . Hyperthyroidism 11/24/2019  . Weight loss 10/21/2019  . IBS (irritable bowel syndrome) 10/21/2019  . Rectal pain 08/21/2019  . Nausea without vomiting 05/30/2019  . Atrial flutter ([place]) 04/17/2019  . Atrial flutter, paroxysmal ([place]) 03/02/2019  . HTN (hypertension) 03/02/2019  . Elevated troponin 03/01/2019  . Hypokalemia 03/01/2019  . Constipation 08/02/2017  . Hemorrhoids 08/02/2017  . Fall 08/18/2015  . Open fracture of left elbow 08/18/2015  . Rib fractures 08/17/2015  .  Radiation proctitis 01/30/2013  . Rectal bleeding 12/10/2012  . Chest pain 05/30/2012  . Elevated lipids 05/30/2012  . Palpitations 05/30/2012  . Dyspnea 05/30/2012  . Obese 05/30/2012  . Prostate cancer ([place]) 08/13/2011   Teena Irani, PTA/CLT [phone]  Teena Irani 06/16/2020, 11:33 AM  [place] [address] [place], [place], [address] Phone: [phone]   Fax:  [phone]  Name: Steven [name] MRN: [id number] Date of Birth: [birth year]

## 2020-06-22 ENCOUNTER — Ambulatory Visit (HOSPITAL_COMMUNITY): Payer: BC Managed Care – PPO | Admitting: Physical Therapy

## 2020-06-22 ENCOUNTER — Telehealth (HOSPITAL_COMMUNITY): Payer: Self-pay | Admitting: Physical Therapy

## 2020-06-22 NOTE — Telephone Encounter (Signed)
pt called to cx this appt due to he is not feeling well 

## 2020-06-23 ENCOUNTER — Emergency Department (HOSPITAL_COMMUNITY): Payer: BC Managed Care – PPO

## 2020-06-23 ENCOUNTER — Encounter (HOSPITAL_COMMUNITY): Payer: Self-pay | Admitting: Emergency Medicine

## 2020-06-23 ENCOUNTER — Emergency Department (HOSPITAL_COMMUNITY)
Admission: EM | Admit: 2020-06-23 | Discharge: 2020-06-23 | Disposition: A | Discharge status: Home / Self Care | Payer: BC Managed Care – PPO | Attending: Emergency Medicine | Admitting: Emergency Medicine

## 2020-06-23 ENCOUNTER — Other Ambulatory Visit: Payer: Self-pay

## 2020-06-23 DIAGNOSIS — M79602 Pain in left arm: Secondary | ICD-10-CM | POA: Insufficient documentation

## 2020-06-23 DIAGNOSIS — Z923 Personal history of irradiation: Secondary | ICD-10-CM | POA: Diagnosis not present

## 2020-06-23 DIAGNOSIS — R0602 Shortness of breath: Secondary | ICD-10-CM | POA: Insufficient documentation

## 2020-06-23 DIAGNOSIS — R42 Dizziness and giddiness: Secondary | ICD-10-CM | POA: Insufficient documentation

## 2020-06-23 DIAGNOSIS — F419 Anxiety disorder, unspecified: Secondary | ICD-10-CM | POA: Diagnosis not present

## 2020-06-23 DIAGNOSIS — Z79899 Other long term (current) drug therapy: Secondary | ICD-10-CM | POA: Diagnosis not present

## 2020-06-23 DIAGNOSIS — Z87891 Personal history of nicotine dependence: Secondary | ICD-10-CM | POA: Diagnosis not present

## 2020-06-23 DIAGNOSIS — Z8546 Personal history of malignant neoplasm of prostate: Secondary | ICD-10-CM | POA: Insufficient documentation

## 2020-06-23 DIAGNOSIS — R001 Bradycardia, unspecified: Secondary | ICD-10-CM | POA: Diagnosis not present

## 2020-06-23 DIAGNOSIS — Z91040 Latex allergy status: Secondary | ICD-10-CM | POA: Insufficient documentation

## 2020-06-23 DIAGNOSIS — Z7901 Long term (current) use of anticoagulants: Secondary | ICD-10-CM | POA: Insufficient documentation

## 2020-06-23 LAB — BASIC METABOLIC PANEL
Anion gap: 5 (ref 5–15)
BUN: 13 mg/dL (ref 8–23)
CO2: 31 mmol/L (ref 22–32)
Calcium: 9.5 mg/dL (ref 8.9–10.3)
Chloride: 102 mmol/L (ref 98–111)
Creatinine, Ser: 0.91 mg/dL (ref 0.61–1.24)
GFR, Estimated: >60 mL/min (ref >60)
Glucose, Bld: 100 mg/dL — ABNORMAL HIGH (ref 70–99)
Potassium: 3.6 mmol/L (ref 3.5–5.1)
Sodium: 138 mmol/L (ref 135–145)

## 2020-06-23 LAB — URINALYSIS, ROUTINE W REFLEX MICROSCOPIC
Bilirubin Urine: NEGATIVE
Glucose, UA: NEGATIVE mg/dL
Hgb urine dipstick: NEGATIVE
Ketones, ur: NEGATIVE mg/dL
Leukocytes,Ua: NEGATIVE
Nitrite: NEGATIVE
Protein, ur: NEGATIVE mg/dL
Specific Gravity, Urine: 1.004 — ABNORMAL LOW (ref 1.005–1.030)
pH: 6 (ref 5.0–8.0)

## 2020-06-23 LAB — CBC
HCT: 39.5 % (ref 39.0–52.0)
Hemoglobin: 12.8 g/dL — ABNORMAL LOW (ref 13.0–17.0)
MCH: 29.9 pg (ref 26.0–34.0)
MCHC: 32.4 g/dL (ref 30.0–36.0)
MCV: 92.3 fL (ref 80.0–100.0)
Platelets: 191 10*3/uL (ref 150–400)
RBC: 4.28 MIL/uL (ref 4.22–5.81)
RDW: 12 % (ref 11.5–15.5)
WBC: 3.6 10*3/uL — ABNORMAL LOW (ref 4.0–10.5)
nRBC: 0 % (ref 0.0–0.2)

## 2020-06-23 LAB — CBG MONITORING, ED: Glucose-Capillary: 93 mg/dL (ref 70–99)

## 2020-06-23 LAB — TROPONIN I (HIGH SENSITIVITY)
Troponin I (High Sensitivity): 6 ng/L (ref <18)
Troponin I (High Sensitivity): 6 ng/L (ref <18)

## 2020-06-23 NOTE — ED Triage Notes (Signed)
Pt c/o shortness of breath, dizziness, and left arm pain.  Pt was seen here 06/08/20 for the same.

## 2020-06-23 NOTE — ED Notes (Signed)
Pt in restroom 

## 2020-06-23 NOTE — ED Provider Notes (Signed)
[facility] EMERGENCY DEPARTMENT Provider Note   CSN: [id number] Arrival date & time: 06/23/20  1256     History Chief Complaint  Patient presents with  . Shortness of Breath    Steven [name] is a 69 y.o. male here with c/o lightheadedness.  This is been ongoing for about a year.  Patient states that he had a preop eval this morning and they said that there was something wrong with his G he decided to come in.  He also states that last night he felt sharp left pain in his left arm and something "that just went across my chest request."  He states that he has never had anything like that.  He claims that he feels winded and nauseous when he has to listen to people talk or talk for a long time himself.  He states "I wish him I would just figure out what is going on."  He is apparently had extensive work-up in the outpatient setting with cardiology, neurology and PCP.  He denies melena, hematochezia, racing or skipping in his heart, active chest pain.  He states that he continuously feels lightheaded.  HPI     Past Medical History:  Diagnosis Date  . Arthritis   . Atrial fibrillation ([place])   . Atrial flutter ([place])   . BPH (benign prostatic hyperplasia)   . Chest pain   . Dizziness   . Dyspnea    laying down occ  . Fracture 08/17/2015   MULTIPLE RIB FRACTURES     FROM FALL   . GERD (gastroesophageal reflux disease)   . Hemorrhoids   . History of kidney stones    noted on CT scan  . History of radiation therapy 08/22/11-10/13/11   prostate  . Hyperlipemia   . Hypertension   . Hyperthyroidism   . Light headedness   . Numbness and tingling in left arm   . Numbness and tingling of both legs   . Prostate cancer [facility])    prostate s/p radiation Mar 2013    Patient Active Problem List   Diagnosis Date Noted  . Perineal pain 03/02/2020  . Subacute thyroiditis 01/07/2020  . Personal history of malignant neoplasm of prostate 12/12/2019  . Benign prostatic hyperplasia with urinary  obstruction 12/12/2019  . Weak urinary stream 12/12/2019  . Hyperthyroidism 11/24/2019  . Weight loss 10/21/2019  . IBS (irritable bowel syndrome) 10/21/2019  . Rectal pain 08/21/2019  . Nausea without vomiting 05/30/2019  . Atrial flutter ([place]) 04/17/2019  . Atrial flutter, paroxysmal ([place]) 03/02/2019  . HTN (hypertension) 03/02/2019  . Elevated troponin 03/01/2019  . Hypokalemia 03/01/2019  . Constipation 08/02/2017  . Hemorrhoids 08/02/2017  . Fall 08/18/2015  . Open fracture of left elbow 08/18/2015  . Rib fractures 08/17/2015  . Radiation proctitis 01/30/2013  . Rectal bleeding 12/10/2012  . Chest pain 05/30/2012  . Elevated lipids 05/30/2012  . Palpitations 05/30/2012  . Dyspnea 05/30/2012  . Obese 05/30/2012  . Prostate cancer ([place]) 08/13/2011    Past Surgical History:  Procedure Laterality Date  . BOTOX INJECTION N/A 01/14/2020   Procedure: INJECTION OF BOTOX INTO ANAL SPHINCTER;  Surgeon: Ileana Roup, MD;  Location: [facility];  Service: General;  Laterality: N/A;  . COLONOSCOPY N/A 12/19/2012   HUD:JSHFWY 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 Normal appearing colon  . COLONOSCOPY WITH PROPOFOL N/A 10/23/2019   Procedure: COLONOSCOPY WITH PROPOFOL;  Surgeon: Daneil Dolin, MD; external and  grade 2 internal hemorrhoids, abnormal rectal blood vessels consistent with radiation proctitis s/p APC therapy, otherwise normal exam.  . EVALUATION UNDER ANESTHESIA WITH ANAL FISTULECTOMY N/A 01/14/2020   Procedure: ANORECTAL EXAM UNDER ANESTHESIA;  Surgeon: Ileana Roup, MD;  Location: [facility];  Service: General;  Laterality: N/A;  . HOT HEMOSTASIS  10/23/2019   Procedure: HOT HEMOSTASIS (ARGON PLASMA COAGULATION/BICAP);  Surgeon: Daneil Dolin, MD;  Location: [facility]O [facility];  Service: Endoscopy;;  apc rectal proctitis    . KIDNEY SURGERY  1982   kidney tube collapse repair  . RADIOACTIVE SEED IMPLANT      Prostate       Family History  Problem Relation Age of Onset  . Aneurysm Mother   . Hypertension Mother   . Prostate cancer Father   . Hypertension Father   . Colon cancer Neg Hx   . Colon polyps Neg Hx     Social History   Tobacco Use  . Smoking status: Former Smoker    Packs/day: 1.00    Years: 20.00    Pack years: 20.00    Types: Cigarettes    Quit date: 2020    Years since quitting: 1.9  . Smokeless tobacco: Former Systems developer  . Tobacco comment: Quit smoking x 2 years    07/2015   SOMETIIMES i USE VAPOR   Vaping Use  . Vaping Use: Never used  Substance Use Topics  . Alcohol use: Never  . Drug use: Never    Home Medications Prior to Admission medications   Medication Sig Start Date End Date Taking? Authorizing Provider  alfuzosin (UROXATRAL) 10 MG 24 hr tablet Take 1 tablet (10 mg total) by mouth daily with breakfast. 03/31/20   [name], Candee Furbish, MD  amitriptyline (ELAVIL) 10 MG tablet TAKE 2 TABLETS BY MOUTH EVERY DAY AT BEDTIME 06/09/20   [provider]  amLODipine (NORVASC) 10 MG tablet Take 10 mg by mouth daily.    [provider]  Cholecalciferol (VITAMIN D3) 125 MCG (5000 UT) CAPS Take 1 capsule by mouth.     [provider]  Cyanocobalamin (B-12 PO) Take 1 tablet by mouth.     [provider]  diazepam (VALIUM) 5 MG tablet Take 5 mg by mouth daily as needed for anxiety.  05/13/19   [provider]  diclofenac (CATAFLAM) 50 MG tablet 2 times daily.    [provider]  ELIQUIS 5 MG TABS tablet TAKE 1 TABLET(5 MG) BY MOUTH TWICE DAILY 03/08/20   Josue Hector, MD  gabapentin (NEURONTIN) 300 MG capsule Take 300 mg by mouth 3 (three) times daily. 02/24/20   [provider]  hydrochlorothiazide (HYDRODIURIL) 25 MG tablet Take 25 mg by mouth daily. 07/28/19   [provider]  metoprolol tartrate (LOPRESSOR) 25 MG tablet Take 1 tablet (25 mg total) by mouth 2 (two) times daily. 04/18/19   Emokpae, Courage,  MD  NEXLETOL 180 MG TABS Take 180 mg by mouth daily. Pt taking 2-3 times a week 12/03/19   [provider]  ondansetron (ZOFRAN ODT) 4 MG disintegrating tablet Take 1 tablet (4 mg total) by mouth every 8 (eight) hours as needed for nausea or vomiting. 06/08/20   Avegno, Darrelyn Hillock, FNP  pantoprazole (PROTONIX) 40 MG tablet TAKE 1 TABLET BY MOUTH DAILY 30 MINUTES BEFORE BREAKFAST 05/11/20   Annitta Needs, NP  polyethylene glycol powder (GLYCOLAX/MIRALAX) 17 GM/SCOOP powder Take by mouth.    [provider]  potassium chloride  SA (KLOR-CON) 20 MEQ tablet Take 20 mEq by mouth 2 (two) times daily. 12/09/19   [provider]  Probiotic Product (PROBIOTIC-10 PO) Take 1 capsule by mouth daily.    [provider]  traMADol (ULTRAM) 50 MG tablet tramadol 50 mg tablet  TAKE 1 TABLET BY MOUTH EVERY 6 HOURS AS NEEDED FOR UP TO 5 DAYS FOR SEVERE POSTOP PAIN NOT CONTROLLED WITH TYLENO/IBUPROFEN    [provider]  zinc gluconate 50 MG tablet Take 50 mg by mouth.     [provider]    Allergies    Latex  Review of Systems   Review of Systems Ten systems reviewed and are negative for acute change, except as noted in the HPI.  Physical Exam Updated Vital Signs BP 127/77 (BP Location: Right Arm)   Pulse (!) 55   Temp 98.6 F (37 C) (Oral)   Resp 18   Ht 5\' 6"  (1.676 m)   Wt 79.8 kg   SpO2 100%   BMI 28.41 kg/m   Physical Exam Vitals and nursing note reviewed.  Constitutional:      General: He is not in acute distress.    Appearance: He is well-developed. He is not diaphoretic.  HENT:     Head: Normocephalic and atraumatic.  Eyes:     General: No scleral icterus.    Conjunctiva/sclera: Conjunctivae normal.  Cardiovascular:     Rate and Rhythm: Normal rate and regular rhythm.     Heart sounds: Normal heart sounds.  Pulmonary:     Effort: Pulmonary effort is normal. No respiratory distress.     Breath sounds: Normal breath sounds.    Abdominal:     Palpations: Abdomen is soft.     Tenderness: There is no abdominal tenderness.  Musculoskeletal:     Cervical back: Normal range of motion and neck supple.  Skin:    General: Skin is warm and dry.  Neurological:     Mental Status: He is alert.  Psychiatric:        Mood and Affect: Mood is anxious.        Behavior: Behavior normal.     ED Results / Procedures / Treatments   Labs (all labs ordered are listed, but only abnormal results are displayed) Labs Reviewed  CBC - Abnormal; Notable for the following components:      Result Value   WBC 3.6 (*)    Hemoglobin 12.8 (*)    All other components within normal limits  BASIC METABOLIC PANEL  URINALYSIS, ROUTINE W REFLEX MICROSCOPIC  CBG MONITORING, ED  TROPONIN I (HIGH SENSITIVITY)    EKG None Sinus bradycardia at a rate of 52  Radiology DG Chest 2 View  Result Date: 06/23/2020 CLINICAL DATA:  Shortness of breath EXAM: CHEST - 2 VIEW COMPARISON:  None. FINDINGS: The heart size and mediastinal contours are within normal limits. Both lungs are clear. No pleural effusion or pneumothorax. No acute osseous abnormality. IMPRESSION: No acute process in the chest. Electronically Signed   By: Macy Mis M.D.   On: 06/23/2020 13:45    Procedures Procedures (including critical care time)  Medications Ordered in ED Medications - No data to display  ED Course  I have reviewed the triage vital signs and the nursing notes.  Pertinent labs & imaging results that were available during my care of the patient were reviewed by me and considered in my medical decision making (see chart for details).    MDM Rules/Calculators/A&P  CC: Lightheadedness VS:  Vitals:   06/23/20 1600 06/23/20 1730 06/23/20 1800 06/23/20 1819  BP: 135/76 128/79 130/71 130/71  Pulse: (!) 49 (!) 53 64 (!) 51  Resp: 14 16 15 18   Temp:      TempSrc:      SpO2: 99% 99% 100% 99%  Weight:      Height:         HW:EXHBZJI is gathered by patient and EMR. Previous records obtained and reviewed. DDX:The patient's complaint of lightheadedness and weakness involves an extensive number of diagnostic and treatment options, and is a complaint that carries with it a high risk of complications, morbidity, and potential mortality. Given the large differential diagnosis, medical decision making is of high complexity. The differential diagnosis of weakness includes but is not limited to neurologic causes (GBS, myasthenia gravis, CVA, MS, ALS, transverse myelitis, spinal cord injury, CVA, botulism, ) and other causes: ACS, Arrhythmia, syncope, orthostatic hypotension, sepsis, hypoglycemia, electrolyte disturbance, hypothyroidism, respiratory failure, symptomatic anemia, dehydration, heat injury, polypharmacy, malignancy.  Labs: I ordered reviewed and interpreted labs which include CBC which shows mildly low white blood cell count, hemoglobin 12.8 with normocytic anemia of insignificant value, urine without infection.  BMP with mildly elevated blood glucose which is insignificant, troponin within normal limits x2  Imaging: I ordered and reviewed images which included 2 view chest x-rays. I independently visualized and interpreted all imaging.There are no acute, significant findings on today's images. RCV:ELFYB bradycardia at a rate of 52 Consults: MDM: Patient here with complaint of months of lightheadedness.  He has all has multiple complaints and concerns.  I have some concern that the patient has a bit of somatic disorder, however he has objective findings of bradycardia here.  His heart rate at times has been into the 40s and I am concerned that his bradycardia may be were the cause of his of his chronic weakness and lightheadedness.  At this point I plan to have him cut his dose in half and take 12.5 mg daily has close follow-up with his cardiologist and I have advised him to discuss the symptoms.  He has had multiple  previous work-ups with neurology and cardiology without findings of his lightheadedness.  Patient's work-up today is reassuring and appears appropriate for discharge at this time Patient disposition:The patient appears reasonably stabilized for admission considering the current resources, flow, and capabilities available in the ED at this time, and I doubt any other [facility] requiring further screening and/or treatment in the ED prior to admission.        Final Clinical Impression(s) / ED Diagnoses Final diagnoses:  None    Rx / DC Orders ED Discharge Orders    None       Margarita Mail, PA-C 06/23/20 Lorri Frederick, MD 06/24/20 1112

## 2020-06-23 NOTE — Discharge Instructions (Signed)
Cut your metoprolol in half. Please take 12.5 mg (1/2 tablet) twice daily. Call Dr. Johnsie Cancel  to follow up and get some guidance on your heart rate and metoprolol.   Get help right away if: You faint. You have: An irregular heartbeat (palpitations). Chest pain. Trouble breathing.

## 2020-06-24 ENCOUNTER — Ambulatory Visit (HOSPITAL_COMMUNITY): Payer: BC Managed Care – PPO | Admitting: Physical Therapy

## 2020-06-25 ENCOUNTER — Other Ambulatory Visit: Payer: Self-pay

## 2020-06-25 ENCOUNTER — Emergency Department (HOSPITAL_COMMUNITY)
Admission: EM | Admit: 2020-06-25 | Discharge: 2020-06-25 | Disposition: A | Discharge status: Home / Self Care | Payer: BC Managed Care – PPO | Attending: Emergency Medicine | Admitting: Emergency Medicine

## 2020-06-25 ENCOUNTER — Encounter (HOSPITAL_COMMUNITY): Payer: Self-pay | Admitting: *Deleted

## 2020-06-25 DIAGNOSIS — Z8546 Personal history of malignant neoplasm of prostate: Secondary | ICD-10-CM | POA: Insufficient documentation

## 2020-06-25 DIAGNOSIS — Z91040 Latex allergy status: Secondary | ICD-10-CM | POA: Insufficient documentation

## 2020-06-25 DIAGNOSIS — R55 Syncope and collapse: Secondary | ICD-10-CM | POA: Insufficient documentation

## 2020-06-25 DIAGNOSIS — R001 Bradycardia, unspecified: Secondary | ICD-10-CM | POA: Diagnosis not present

## 2020-06-25 DIAGNOSIS — Z79899 Other long term (current) drug therapy: Secondary | ICD-10-CM | POA: Insufficient documentation

## 2020-06-25 DIAGNOSIS — I10 Essential (primary) hypertension: Secondary | ICD-10-CM | POA: Insufficient documentation

## 2020-06-25 DIAGNOSIS — Z87891 Personal history of nicotine dependence: Secondary | ICD-10-CM | POA: Insufficient documentation

## 2020-06-25 DIAGNOSIS — R42 Dizziness and giddiness: Secondary | ICD-10-CM | POA: Diagnosis present

## 2020-06-25 DIAGNOSIS — Z923 Personal history of irradiation: Secondary | ICD-10-CM | POA: Insufficient documentation

## 2020-06-25 NOTE — Discharge Instructions (Addendum)
Your lightheadedness may be due to the current blood pressure medication that you are on.  Please hold off on taking your amlodipine,, and follow-up closely with your primary care doctor within the next few days for medication readjustment and further managements of your condition.

## 2020-06-25 NOTE — ED Provider Notes (Signed)
[facility] Provider Note   CSN: [id number] Arrival date & time: 06/25/20  1028     History Chief Complaint  Patient presents with  . Dizziness    Steven [name] is a 69 y.o. male.  The history is provided by the patient and medical records. No language interpreter was used.  Dizziness    69 year old male significant history of recurrent dizziness lightheadedness, hypertension, prostate cancer, atrial fibrillation presenting here with complaints of lightheadedness and dizziness.  Patient mention he has had trouble with lightheadedness dizziness for more than a year however within the past 2 weeks he noticed worsening of his symptoms.  He described his symptoms more as lightheadedness worse with positional change.  He also endorsed associated nausea with the symptoms and feeling "sick".  He was seen in the ED 2 days ago for his symptoms and had a full work-up.  He was told to decrease one of his blood pressure medication ?metoprolol.  He did reach out to his PCP yesterday in regards to his symptoms.  He mentioned that at nighttime he notes that his blood pressure is high in the 725 systolic.  He was told to increase his amlodipine to 25 mg.  His symptoms still persist prompting this ER visit.  Patient otherwise denies having any fever chills.  No worsening chest pain no headache, focal numbness or focal weakness.  Past Medical History:  Diagnosis Date  . Arthritis   . Atrial fibrillation ([place])   . Atrial flutter ([place])   . BPH (benign prostatic hyperplasia)   . Chest pain   . Dizziness   . Dyspnea    laying down occ  . Fracture 08/17/2015   MULTIPLE RIB FRACTURES     FROM FALL   . GERD (gastroesophageal reflux disease)   . Hemorrhoids   . History of kidney stones    noted on CT scan  . History of radiation therapy 08/22/11-10/13/11   prostate  . Hyperlipemia   . Hypertension   . Hyperthyroidism   . Light headedness   . Numbness and tingling in left arm     . Numbness and tingling of both legs   . Prostate cancer [facility])    prostate s/p radiation Mar 2013    Patient Active Problem List   Diagnosis Date Noted  . Perineal pain 03/02/2020  . Subacute thyroiditis 01/07/2020  . Personal history of malignant neoplasm of prostate 12/12/2019  . Benign prostatic hyperplasia with urinary obstruction 12/12/2019  . Weak urinary stream 12/12/2019  . Hyperthyroidism 11/24/2019  . Weight loss 10/21/2019  . IBS (irritable bowel syndrome) 10/21/2019  . Rectal pain 08/21/2019  . Nausea without vomiting 05/30/2019  . Atrial flutter ([place]) 04/17/2019  . Atrial flutter, paroxysmal ([place]) 03/02/2019  . HTN (hypertension) 03/02/2019  . Elevated troponin 03/01/2019  . Hypokalemia 03/01/2019  . Constipation 08/02/2017  . Hemorrhoids 08/02/2017  . Fall 08/18/2015  . Open fracture of left elbow 08/18/2015  . Rib fractures 08/17/2015  . Radiation proctitis 01/30/2013  . Rectal bleeding 12/10/2012  . Chest pain 05/30/2012  . Elevated lipids 05/30/2012  . Palpitations 05/30/2012  . Dyspnea 05/30/2012  . Obese 05/30/2012  . Prostate cancer ([place]) 08/13/2011    Past Surgical History:  Procedure Laterality Date  . BOTOX INJECTION N/A 01/14/2020   Procedure: INJECTION OF BOTOX INTO ANAL SPHINCTER;  Surgeon: Ileana Roup, MD;  Location: [facility];  Service: General;  Laterality: N/A;  . COLONOSCOPY N/A 12/19/2012   [id number] bleeding  secondary to radiation induced proctitis  - status post APC ablation; internal hemorrhoids. Normal appearing colon  . COLONOSCOPY WITH PROPOFOL N/A 10/23/2019   Procedure: COLONOSCOPY WITH PROPOFOL;  Surgeon: Daneil Dolin, MD; external and grade 2 internal hemorrhoids, abnormal rectal blood vessels consistent with radiation proctitis s/p APC therapy, otherwise normal exam.  . EVALUATION UNDER ANESTHESIA WITH ANAL FISTULECTOMY N/A 01/14/2020   Procedure: ANORECTAL EXAM UNDER ANESTHESIA;  Surgeon: Ileana Roup, MD;   Location: [facility];  Service: General;  Laterality: N/A;  . HOT HEMOSTASIS  10/23/2019   Procedure: HOT HEMOSTASIS (ARGON PLASMA COAGULATION/BICAP);  Surgeon: Daneil Dolin, MD;  [facility];  Service: Endoscopy;;  apc rectal proctitis    . KIDNEY SURGERY  1982   kidney tube collapse repair  . RADIOACTIVE SEED IMPLANT     Prostate       Family History  Problem Relation Age of Onset  . Aneurysm Mother   . Hypertension Mother   . Prostate cancer Father   . Hypertension Father   . Colon cancer Neg Hx   . Colon polyps Neg Hx     Social History   Tobacco Use  . Smoking status: Former Smoker    Packs/day: 1.00    Years: 20.00    Pack years: 20.00    Types: Cigarettes    Quit date: 2020    Years since quitting: 1.9  . Smokeless tobacco: Former Systems developer  . Tobacco comment: Quit smoking x 2 years    07/2015   SOMETIIMES i USE VAPOR   Vaping Use  . Vaping Use: Never used  Substance Use Topics  . Alcohol use: Never  . Drug use: Never    Home Medications Prior to Admission medications   Medication Sig Start Date End Date Taking? Authorizing Provider  alfuzosin (UROXATRAL) 10 MG 24 hr tablet Take 1 tablet (10 mg total) by mouth daily with breakfast. 03/31/20   [name], Candee Furbish, MD  amitriptyline (ELAVIL) 10 MG tablet Take 10 mg by mouth at bedtime.  06/09/20   [provider]  amLODipine (NORVASC) 10 MG tablet Take 10 mg by mouth daily.    [provider]  Ascorbic Acid (VITAMIN C) 1000 MG tablet Take 1,000 mg by mouth daily.    [provider]  Cholecalciferol (VITAMIN D3) 125 MCG (5000 UT) CAPS Take 1 capsule by mouth daily.     [provider]  Cyanocobalamin (B-12 PO) Take 1 tablet by mouth daily.     [provider]  diazepam (VALIUM) 5 MG tablet Take 5 mg by mouth daily as needed for anxiety.  05/13/19   [provider]  diclofenac (CATAFLAM) 50 MG tablet Take 50 mg by mouth 2 (two) times daily.     [provider]  ELIQUIS 5 MG TABS tablet TAKE 1 TABLET(5 MG) BY MOUTH TWICE DAILY Patient taking differently: Take 5 mg by mouth 2 (two) times daily.  03/08/20   Josue Hector, MD  Erenumab-aooe (AIMOVIG) 70 MG/ML SOAJ Inject 1 mL into the skin every 30 (thirty) days.     [provider]  gabapentin (NEURONTIN) 300 MG capsule Take 300 mg by mouth 3 (three) times daily. 02/24/20   [provider]  gabapentin (NEURONTIN) 600 MG tablet Take 600 mg by mouth 3 (three) times daily. 05/29/20   [provider]  hydrochlorothiazide (HYDRODIURIL) 25 MG tablet Take 25 mg by mouth daily. 07/28/19   [provider]  hyoscyamine (  LEVSIN SL) 0.125 MG SL tablet Place under the tongue every 4 (four) hours. Patient not taking: Reported on 06/23/2020 04/30/20   [provider]  ketorolac (TORADOL) 10 MG tablet Take 10 mg by mouth every 8 (eight) hours as needed.     [provider]  metoprolol tartrate (LOPRESSOR) 25 MG tablet Take 1 tablet (25 mg total) by mouth 2 (two) times daily. 04/18/19   Emokpae, Courage, MD  NEXLETOL 180 MG TABS Take 180 mg by mouth daily. Pt taking 2-3 times a week 12/03/19   [provider]  ondansetron (ZOFRAN ODT) 4 MG disintegrating tablet Take 1 tablet (4 mg total) by mouth every 8 (eight) hours as needed for nausea or vomiting. 06/08/20   Avegno, Darrelyn Hillock, FNP  pantoprazole (PROTONIX) 40 MG tablet TAKE 1 TABLET BY MOUTH DAILY 30 MINUTES BEFORE BREAKFAST Patient taking differently: Take 40 mg by mouth See admin instructions. Take 1 tablet by mouth daily 30 minutes before breakfast 05/11/20   Annitta Needs, NP  polyethylene glycol powder (GLYCOLAX/MIRALAX) 17 GM/SCOOP powder Take by mouth.    [provider]  potassium chloride SA (KLOR-CON) 20 MEQ tablet Take 20 mEq by mouth 2 (two) times daily. 12/09/19   [provider]  Probiotic Product (PROBIOTIC-10 PO) Take 1 capsule by mouth daily.    [provider]  traMADol (ULTRAM) 50 MG tablet Take 50 mg by mouth every 6 (six) hours as needed.     [provider]  Wheat Dextrin (BENEFIBER DRINK MIX PO) Take 1 oz by mouth daily.     [provider]  zinc gluconate 50 MG tablet Take 50 mg by mouth daily.     [provider]    Allergies    Latex  Review of Systems   Review of Systems  Neurological: Positive for dizziness.  All other systems reviewed and are negative.   Physical Exam Updated Vital Signs BP 118/65   Pulse (!) 50   Temp 98.1 F (36.7 C) (Oral)   Resp 14   SpO2 97%   Physical Exam Vitals and nursing note reviewed.  Constitutional:      General: He is not in acute distress.    Appearance: He is well-developed.  HENT:     Head: Atraumatic.     Mouth/Throat:     Mouth: Mucous membranes are moist.  Eyes:     Conjunctiva/sclera: Conjunctivae normal.  Cardiovascular:     Rate and Rhythm: Bradycardia present.  Pulmonary:     Effort: Pulmonary effort is normal.     Breath sounds: Normal breath sounds.  Abdominal:     Palpations: Abdomen is soft.     Tenderness: There is no abdominal tenderness.  Musculoskeletal:     Cervical back: Neck supple.     Comments: 5 out of 5 strength in all 4 extremities.  Skin:    Findings: No rash.  Neurological:     Mental Status: He is alert. Mental status is at baseline.  Psychiatric:        Mood and Affect: Mood normal.     ED Results / Procedures / Treatments   Labs (all labs ordered are listed, but only abnormal results are displayed) Labs Reviewed - No data to display  EKG None   Date: 06/25/2020  Rate: 58  Rhythm: sinus bradycardia  QRS Axis: normal  Intervals: normal  ST/T Wave abnormalities: normal  Conduction Disutrbances: none  Narrative Interpretation:   Old EKG Reviewed: No  significant changes noted     Radiology DG Chest 2 View  Result Date: 06/23/2020 CLINICAL DATA:  Shortness of breath EXAM: CHEST - 2 VIEW COMPARISON:   None. FINDINGS: The heart size and mediastinal contours are within normal limits. Both lungs are clear. No pleural effusion or pneumothorax. No acute osseous abnormality. IMPRESSION: No acute process in the chest. Electronically Signed   By: Macy Mis M.D.   On: 06/23/2020 13:45    Procedures Procedures (including critical care time)  Medications Ordered in ED Medications - No data to display  ED Course  I have reviewed the triage vital signs and the nursing notes.  Pertinent labs & imaging results that were available during my care of the patient were reviewed by me and considered in my medical decision making (see chart for details).    MDM Rules/Calculators/A&P                          BP 130/71   Pulse (!) 49   Temp 98.1 F (36.7 C) (Oral)   Resp 16   SpO2 100%   Final Clinical Impression(s) / ED Diagnoses Final diagnoses:  Near syncope  Bradycardia    Rx / DC Orders ED Discharge Orders    None     1:51 PM Patient here with recurrent lightheadedness and dizziness which has been ongoing issue for more than a year but worse in the past 2 weeks.  Was seen several days ago for similar complaint and has a fairly thorough work-up at that time without any obvious finding except signs of bradycardia which can contribute to symptoms.  He was told to decrease his metoprolol however it appears he had a conversation with his PCP yesterday and was voicing concern that his blood pressure is in the 409W systolic at nighttime therefore his amlodipine was increased to 25 mg.  At this time, I felt that his symptom is likely due to symptomatic bradycardia and I felt that his medication may contribute to his symptoms.  Blood pressure in the room is 119 systolic with heart rate of 52.  Encourage patient to decrease his blood pressure medication and follow-up closely with PCP in person for further evaluation.  He may also benefit from a thyroid function test outpatient as well.  EKG without  heart block or concerning arrhythmia.  Normal orthostatic vital sign.  CXR unremarkable.  Care discussed with Dr. Alvino Chapel.   Pt was seen by cardiology <1 month ago and they felt his sxs is non cardiac related.  Also no vertigo to suggest stroke.    Felt bradycardia 2/2 BP medication may contribute to his sxs.     Domenic Moras, PA-C 06/25/20 1713    Davonna Belling, MD 06/26/20 [phone]

## 2020-06-25 NOTE — ED Triage Notes (Signed)
Dizziness for 2 weeks

## 2020-06-29 ENCOUNTER — Ambulatory Visit (HOSPITAL_COMMUNITY): Payer: BC Managed Care – PPO | Attending: Surgery | Admitting: Physical Therapy

## 2020-06-29 ENCOUNTER — Encounter (HOSPITAL_COMMUNITY): Payer: Self-pay | Admitting: Physical Therapy

## 2020-06-29 ENCOUNTER — Other Ambulatory Visit: Payer: Self-pay

## 2020-06-29 DIAGNOSIS — M542 Cervicalgia: Secondary | ICD-10-CM | POA: Diagnosis not present

## 2020-06-29 DIAGNOSIS — R29898 Other symptoms and signs involving the musculoskeletal system: Secondary | ICD-10-CM | POA: Insufficient documentation

## 2020-06-29 NOTE — Patient Instructions (Addendum)
Access Code: [facility] URL: [email or web address] Date: 06/29/2020 Prepared by: Mitzi Hansen Steven [name]  Exercises Supine Chin Tuck - 2 x daily - 7 x weekly - 2 sets - 10 reps Seated Cervical Rotation AROM - 1 x daily - 7 x weekly - 10 reps Seated Cervical Sidebending AROM - 1 x daily - 7 x weekly - 10 reps

## 2020-06-29 NOTE — Therapy (Signed)
[facility] [place], [place], [address] Phone: [phone]   Fax:  [phone]  Physical Therapy Treatment/Discharge Summary  Patient Details  Name: Steven [name] MRN: [id number] Date of Birth: 24-Aug-1950 Referring Provider (PT): Duffy Rhody MD   Encounter Date: 06/29/2020  PHYSICAL THERAPY DISCHARGE SUMMARY  Visits from Start of Care: 3  Current functional level related to goals / functional outcomes: See below   Remaining deficits: See below   Education / Equipment: See below  Plan: Patient agrees to discharge.  Patient goals were not met. Patient is being discharged due to lack of progress.  ?????        PT End of Session - 06/29/20 1054    Visit Number 3    Number of Visits 8    Date for PT Re-Evaluation 06/28/20    Authorization Type Primary BCBS Secondary Medicare    PT Start Time 1055   arrives late   PT Stop Time 1125    PT Time Calculation (min) 30 min    Activity Tolerance Patient tolerated treatment well    Behavior During Therapy WFL for tasks assessed/performed           Past Medical History:  Diagnosis Date  . Arthritis   . Atrial fibrillation ([place])   . Atrial flutter ([place])   . BPH (benign prostatic hyperplasia)   . Chest pain   . Dizziness   . Dyspnea    laying down occ  . Fracture 08/17/2015   MULTIPLE RIB FRACTURES     FROM FALL   . GERD (gastroesophageal reflux disease)   . Hemorrhoids   . History of kidney stones    noted on CT scan  . History of radiation therapy 08/22/11-10/13/11   prostate  . Hyperlipemia   . Hypertension   . Hyperthyroidism   . Light headedness   . Numbness and tingling in left arm   . Numbness and tingling of both legs   . Prostate cancer [facility])    prostate s/p radiation Mar 2013    Past Surgical History:  Procedure Laterality Date  . BOTOX INJECTION N/A 01/14/2020   Procedure: INJECTION OF BOTOX INTO ANAL SPHINCTER;  Surgeon: Ileana Roup, MD;  [facility];  Service: General;  Laterality: N/A;  . COLONOSCOPY N/A 12/19/2012   EHO:ZYYQMG 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 Normal appearing colon  . COLONOSCOPY WITH PROPOFOL N/A 10/23/2019   Procedure: COLONOSCOPY WITH PROPOFOL;  Surgeon: Daneil Dolin, MD; external and grade 2 internal hemorrhoids, abnormal rectal blood vessels consistent with radiation proctitis s/p APC therapy, otherwise normal exam.  . EVALUATION UNDER ANESTHESIA WITH ANAL FISTULECTOMY N/A 01/14/2020   Procedure: ANORECTAL EXAM UNDER ANESTHESIA;  Surgeon: Ileana Roup, MD;  [facility];  Service: General;  Laterality: N/A;  . HOT HEMOSTASIS  10/23/2019   Procedure: HOT HEMOSTASIS (ARGON PLASMA COAGULATION/BICAP);  Surgeon: Daneil Dolin, MD;  [facility];  Service: Endoscopy;;  apc rectal proctitis    . KIDNEY SURGERY  1982   kidney tube collapse repair  . RADIOACTIVE SEED IMPLANT     Prostate    There were no vitals filed for this visit.   Subjective Assessment - 06/29/20 1057    Subjective Patient states his neck hasn't been too bad. He has been having headaches and dizziness. Patient has not really been doing home exercises he was given but he  was doing some other old exercises he was given. He hasnt really been having any neck pain.    Pertinent History prostate cancer; irritable bowel syndrome; carpal tunnel; 2017 fx radius LT    Currently in Pain? No/denies              [facility] PT Assessment - 06/29/20 0001      Assessment   Medical Diagnosis Cervical Radiculopathy    Referring Provider (PT) Duffy Rhody MD    Onset Date/Surgical Date 01/29/20    Next MD Visit no follow up scheduled    Prior Therapy yes      Precautions   Precautions None      Restrictions   Weight Bearing Restrictions No      Balance Screen   Has the patient fallen in the past 6 months No      [place] residence      Prior Function   Level of Independence Independent      Cognition   Overall Cognitive Status Within Functional Limits for tasks assessed      Observation/Other Assessments   Observations slouched in seated    Focus on Therapeutic Outcomes (FOTO)  not completed today secondary to time constraints      Sensation   Light Touch Appears Intact      AROM   Cervical Flexion 0% limited    Cervical Extension 25% limited    Cervical - Right Side Bend 25% limited    Cervical - Left Side Bend 25% limited    Cervical - Right Rotation 0% limited    Cervical - Left Rotation 0% limited                         [facility] Adult PT Treatment/Exercise - 06/29/20 0001      Neck Exercises: Supine   Neck Retraction 10 reps;3 secs    Neck Retraction Limitations 2 sets                  PT Education - 06/29/20 1056    Education Details Patient educated on HEP, exercise mechanics, posture, returning to PT if needed    Person(s) Educated Patient    Methods Explanation;Handout    Comprehension Verbalized understanding;Returned demonstration            PT Short Term Goals - 06/29/20 1103      PT SHORT TERM GOAL #1   Title Patient will be independent with HEP in order to improve functional outcomes.    Time 2    Period Weeks    Status Not Met    Target Date 06/14/20      PT SHORT TERM GOAL #2   Title Patient will report at least 25% improvement in symptoms for improved quality of life.    Time 2    Period Weeks    Status Not Met    Target Date 06/14/20             PT Long Term Goals - 06/29/20 1109      PT LONG TERM GOAL #1   Title Patient will report at least 75% improvement in symptoms for improved quality of life.    Time 4    Period Weeks    Status Not Met      PT LONG TERM GOAL #2   Title Patient will be able to sleep with centralized UE symptoms no greater than 2/10  for improved quality of rest.     Time 4    Period Weeks    Status Not Met      PT LONG TERM GOAL #3   Title Patient will demonstrate at least 25% improvement in cervical ROM in all restricted planes for improved ability to move head while at completing ADL.    Time 4    Period Weeks    Status Not Met                 Plan - 06/29/20 1055    Clinical Impression Statement Patient has not met any short or long term goals at this time secondary to attending limited number of sessions and numerous health conditions going on currently. Patient showing minimal improvements in ROM but continues to be limited by intermittent increase in symptoms, headaches. Patient has not been compliant with HEP. Patient shown how to complete cervical retractions properly and requires manual, tactile, and verbal cueing. Checked patients blood pressure at 132/76. Reviewed HEP with patient and patient will work on HEP while he is busy with numerous other appointments and he will return if necessary. Patient educated on importance of posture on neck/headache symptoms. Patient discharged at this time from physical therapy.    Personal Factors and Comorbidities Behavior Pattern;Social Background;Time since onset of injury/illness/exacerbation;Transportation;Fitness;Past/Current Experience    Examination-Activity Limitations Sleep;Lift;Reach Overhead    Examination-Participation Restrictions Church;Cleaning;Community Activity;Shop;Yard Work    Stability/Clinical Decision Making Stable/Uncomplicated    Rehab Potential Fair    PT Frequency --    PT Duration --    PT Treatment/Interventions Patient/family education;Therapeutic exercise;Manual techniques;ADLs/Self Care Home Management;Aquatic Therapy;Cryotherapy;Electrical Stimulation;Iontophoresis 21m/ml Dexamethasone;Moist Heat;Traction;Ultrasound;Stair training;DME Instruction;Functional mobility training;Gait training;Therapeutic activities;Balance training;Neuromuscular re-education;Orthotic  Fit/Training;Dry needling;Energy conservation;Splinting;Spinal Manipulations;Joint Manipulations    PT Next Visit Plan --    PT Home Exercise Plan 11/8 cervical retraction in supine   11/24:  Thoracic excursions    Consulted and Agree with Plan of Care Patient           Patient will benefit from skilled therapeutic intervention in order to improve the following deficits and impairments:  Pain, Increased muscle spasms, Decreased activity tolerance, Decreased mobility, Decreased range of motion, Hypomobility, Improper body mechanics, Postural dysfunction, Decreased endurance, Decreased strength  Visit Diagnosis: Cervicalgia  Other symptoms and signs involving the musculoskeletal system     Problem List Patient Active Problem List   Diagnosis Date Noted  . Perineal pain 03/02/2020  . Subacute thyroiditis 01/07/2020  . Personal history of malignant neoplasm of prostate 12/12/2019  . Benign prostatic hyperplasia with urinary obstruction 12/12/2019  . Weak urinary stream 12/12/2019  . Hyperthyroidism 11/24/2019  . Weight loss 10/21/2019  . IBS (irritable bowel syndrome) 10/21/2019  . Rectal pain 08/21/2019  . Nausea without vomiting 05/30/2019  . Atrial flutter ([place] 04/17/2019  . Atrial flutter, paroxysmal ([place] 03/02/2019  . HTN (hypertension) 03/02/2019  . Elevated troponin 03/01/2019  . Hypokalemia 03/01/2019  . Constipation 08/02/2017  . Hemorrhoids 08/02/2017  . Fall 08/18/2015  . Open fracture of left elbow 08/18/2015  . Rib fractures 08/17/2015  . Radiation proctitis 01/30/2013  . Rectal bleeding 12/10/2012  . Chest pain 05/30/2012  . Elevated lipids 05/30/2012  . Palpitations 05/30/2012  . Dyspnea 05/30/2012  . Obese 05/30/2012  . [place] 08/13/2011    11:24 AM, 06/29/20 AMearl LatinPT, DPT [facility] Therap[facility]  [place],  [address] Phone: [phone]   Fax:  [phone]  Name: CHIDUBEM CHAIRES MRN: [id number] Date of Birth: 1951-03-09

## 2020-06-29 NOTE — Addendum Note (Signed)
Addended by: Mearl Latin on: 06/29/2020 01:23 PM   Modules accepted: Orders

## 2020-07-02 ENCOUNTER — Ambulatory Visit: Payer: BC Managed Care – PPO | Admitting: Urology

## 2020-07-02 ENCOUNTER — Telehealth: Payer: Self-pay | Admitting: Cardiovascular Disease

## 2020-07-02 NOTE — Telephone Encounter (Signed)
New message     STAT if patient feels like he/she is going to faint   1) Are you dizzy now?  Yes   2) Do you feel faint or have you passed out? No   3) Do you have any other symptoms? Dizziness, spinning, nausea   4) Have you checked your HR and BP (record if available)? No not keeping a record   He went to the ER they wanted to adjust his medication  , they told him to take half of the metoprolol and stop the amlodipine , but his bp shot up when he stopped the amlodipine , so he took it and only half the metoprolol , his hr is staying in the 50's    bp 133/83 right now

## 2020-07-02 NOTE — Telephone Encounter (Signed)
Steven [name] saw his PCP last week and was given Antivert and states hit helped with his dizziness "some" He admits he has only taken a dose per day.i encouraged him to take as directed and follow up with PCP.His current BP is normal as is his heart rate.He states he will monitor BP more often at home since he did not the stop the amlodipine as directed by ED.

## 2020-07-05 ENCOUNTER — Telehealth: Payer: Self-pay | Admitting: Internal Medicine

## 2020-07-05 NOTE — Telephone Encounter (Signed)
Called pt, number is busy. Will call pt back.

## 2020-07-05 NOTE — Telephone Encounter (Signed)
Pt wants to speak with nurse. He said he was having some problems. [phone]

## 2020-07-06 NOTE — Telephone Encounter (Signed)
Pt called with c/o a rash of fungal infection around his anus that's itching and burning.  Pt used an otc antifungal cr and neosporin. Pt has an apt 07/21/20. Pt mentioned that he see's Dr. Donnella Bi in [place] (GI Doctor) that's a part of [place]. Per pt our office referred him to [facility].

## 2020-07-06 NOTE — Telephone Encounter (Signed)
As I am not able to see this "fungal infection" it is difficult for me to say what this is truly. To treat possible fungal infection, he can apply a pea sized amount of OTC clotrimazole cream (Lotrimin) BID to his rectum for the next 7 days.   He has follow-up with Dr. Morton Stall on 12/21 who can also take a look at that time.

## 2020-07-06 NOTE — Telephone Encounter (Signed)
Noted. Spoke with pt. Pt will try otc Clotrimazole cr bid x 7 days and f/u with his Dr. Morton Stall on 12/21. Cyril Mourning, should pt cancel his apt with you on 12/29?

## 2020-07-06 NOTE — Telephone Encounter (Signed)
Lmom, waiting on a return call.  

## 2020-07-07 ENCOUNTER — Ambulatory Visit (INDEPENDENT_AMBULATORY_CARE_PROVIDER_SITE_OTHER): Payer: BC Managed Care – PPO | Admitting: Internal Medicine

## 2020-07-07 ENCOUNTER — Other Ambulatory Visit: Payer: Self-pay

## 2020-07-07 ENCOUNTER — Encounter: Payer: Self-pay | Admitting: Internal Medicine

## 2020-07-07 VITALS — BP 150/84 | HR 53 | Temp 98.4°F | Resp 18 | Ht 66.0 in | Wt 177.8 lb

## 2020-07-07 DIAGNOSIS — C61 Malignant neoplasm of prostate: Secondary | ICD-10-CM

## 2020-07-07 DIAGNOSIS — Z7689 Persons encountering health services in other specified circumstances: Secondary | ICD-10-CM | POA: Diagnosis not present

## 2020-07-07 DIAGNOSIS — K5900 Constipation, unspecified: Secondary | ICD-10-CM

## 2020-07-07 DIAGNOSIS — R42 Dizziness and giddiness: Secondary | ICD-10-CM

## 2020-07-07 DIAGNOSIS — I480 Paroxysmal atrial fibrillation: Secondary | ICD-10-CM | POA: Diagnosis not present

## 2020-07-07 DIAGNOSIS — I10 Essential (primary) hypertension: Secondary | ICD-10-CM | POA: Diagnosis not present

## 2020-07-07 DIAGNOSIS — F419 Anxiety disorder, unspecified: Secondary | ICD-10-CM

## 2020-07-07 DIAGNOSIS — M797 Fibromyalgia: Secondary | ICD-10-CM | POA: Insufficient documentation

## 2020-07-07 DIAGNOSIS — R110 Nausea: Secondary | ICD-10-CM

## 2020-07-07 DIAGNOSIS — G8929 Other chronic pain: Secondary | ICD-10-CM | POA: Insufficient documentation

## 2020-07-07 NOTE — Assessment & Plan Note (Addendum)
BP Readings from Last 1 Encounters:  07/07/20 (!) 150/84   Stable with Metoprolol and HCTZ Component of anxiety contributing to elevated BP Counseled for compliance with the medications Advised DASH diet and moderate exercise/walking, at least 150 mins/week

## 2020-07-07 NOTE — Assessment & Plan Note (Signed)
S/p  Radiotherapy On Afluzosin Follows up with Urologist

## 2020-07-07 NOTE — Assessment & Plan Note (Signed)
H/o IBS-C On Miralax Follows up with GI

## 2020-07-07 NOTE — Assessment & Plan Note (Signed)
Care established Previous chart reviewed History and medications reviewed with the patient 

## 2020-07-07 NOTE — Assessment & Plan Note (Signed)
Currently in sinus rhythm Rate-controlled with Metoprolol On Eliquis Follows up with Cardiologist

## 2020-07-07 NOTE — Progress Notes (Signed)
New Patient Office Visit  Subjective:  Patient ID: Steven [name], male    DOB: Jun 28, 1951  Age: 69 y.o. MRN: [id number]  CC:  Chief Complaint  Patient presents with  . New Patient (Initial Visit)    New patient has been having dizziness nausea and sick feelings for about a year but this has worsened in the last three weeks he is taking meds for vertigo but it goes and comes     HPI Steven [name] is a 69 year old male with PMH of paroxysmal atrial fibrillation on anticoagulation, hypertension, malignant prostate carcinoma s/p radiotherapy, chronic pain/fibromyalgia, IBS-C, radiation proctitis, and chronic dizziness who presents for establishing care.  Patient has had multiple ER visits recently for dizziness. Cardiac workup have been noncontributory for dizziness spells.  Patient used to follow-up with ENT specialist in the past and has been appointment with them soon.  Patient is to be evaluated for vestibular rehab after 2 weeks.  He describes dizziness as blurriness with occasional room spinning sensation, worse while using phone and feels better while he rests or closes his eyes.  He states that he is worried about his history of atrial fibrillation and has been having episodes of dizziness after he came to know about his atrial fibrillation. He reports having occasional palpitations and dyspnea, which appear to be related to anxiety/panic episodes as his home cardiac monitoring was negative in the past as follows.  Event Monitor: 03/2019 NSR  No significant arrhythmia No PAF  Myovue 06/25/19:  EF 62% soft tissue attenuation no ischemia.   Patient follows up with Dr Merlene Laughter for migraine and insomnia.  He takes gabapentin, amitriptyline and Aimovig.  He uses Valium as needed for anxiety.  He reports not taking amitriptyline regularly.  Patient has had 2 doses of COVID vaccine and flu vaccine as well.  Past Medical History:  Diagnosis Date  . Arthritis   . Atrial fibrillation  ([place])   . Atrial flutter ([place])   . BPH (benign prostatic hyperplasia)   . Chest pain   . Dizziness   . Dyspnea    laying down occ  . Fracture 08/17/2015   MULTIPLE RIB FRACTURES     FROM FALL   . GERD (gastroesophageal reflux disease)   . Hemorrhoids   . History of kidney stones    noted on CT scan  . History of radiation therapy 08/22/11-10/13/11   prostate  . Hyperlipemia   . Hypertension   . Hyperthyroidism   . Light headedness   . Numbness and tingling in left arm   . Numbness and tingling of both legs   . Open fracture of left elbow 08/18/2015  . Prostate cancer [facility])    prostate s/p radiation Mar 2013  . Rib fractures 08/17/2015    Past Surgical History:  Procedure Laterality Date  . BOTOX INJECTION N/A 01/14/2020   Procedure: INJECTION OF BOTOX INTO ANAL SPHINCTER;  Surgeon: Ileana Roup, MD;  Location: [facility]S;  Service: General;  Laterality: N/A;  . COLONOSCOPY N/A 12/19/2012   WHQ:PRFFMB 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 Normal appearing colon  . COLONOSCOPY WITH PROPOFOL N/A 10/23/2019   Procedure: COLONOSCOPY WITH PROPOFOL;  Surgeon: Daneil Dolin, MD; external and grade 2 internal hemorrhoids, abnormal rectal blood vessels consistent with radiation proctitis s/p APC therapy, otherwise normal exam.  . EVALUATION UNDER ANESTHESIA WITH ANAL FISTULECTOMY N/A 01/14/2020   Procedure: ANORECTAL EXAM UNDER ANESTHESIA;  Surgeon: Dema Severin,  Sharon Mt, MD;  Location: [facility] ORS;  Service: General;  Laterality: N/A;  . HOT HEMOSTASIS  10/23/2019   Procedure: HOT HEMOSTASIS (ARGON PLASMA COAGULATION/BICAP);  Surgeon: Daneil Dolin, MD;  Location: [facility];  Service: Endoscopy;;  apc rectal proctitis    . KIDNEY SURGERY  1982   kidney tube collapse repair  . RADIOACTIVE SEED IMPLANT     Prostate    Family History  Problem Relation Age of Onset  . Aneurysm Mother   . Hypertension Mother   . Prostate  cancer Father   . Hypertension Father   . Colon cancer Neg Hx   . Colon polyps Neg Hx     Social History   Socioeconomic History  . Marital status: Single    Spouse name: Not on file  . Number of children: Not on file  . Years of education: Not on file  . Highest education level: Not on file  Occupational History  . Occupation: Custodian     Employer: [employer]  Tobacco Use  . Smoking status: Former Smoker    Packs/day: 1.00    Years: 20.00    Pack years: 20.00    Types: Cigarettes    Quit date: 2020    Years since quitting: 1.9  . Smokeless tobacco: Former Systems developer  . Tobacco comment: Quit smoking x 2 years    07/2015   SOMETIIMES i USE VAPOR   Vaping Use  . Vaping Use: Never used  Substance and Sexual Activity  . Alcohol use: Never  . Drug use: Never  . Sexual activity: Not Currently  Other Topics Concern  . Not on file  Social History Narrative   Divorced   Social Determinants of Health   Financial Resource Strain: Not on file  Food Insecurity: Not on file  Transportation Needs: Not on file  Physical Activity: Not on file  Stress: Not on file  Social Connections: Not on file  Intimate Partner Violence: Not on file    ROS Review of Systems  Constitutional: Positive for activity change and fatigue. Negative for chills and fever.  HENT: Negative for congestion and sore throat.   Eyes: Negative for pain and discharge.  Respiratory: Positive for shortness of breath. Negative for cough.   Cardiovascular: Positive for palpitations. Negative for chest pain.  Gastrointestinal: Positive for nausea. Negative for constipation, diarrhea and vomiting.  Endocrine: Negative for polydipsia and polyuria.  Genitourinary: Negative for dysuria and hematuria.  Musculoskeletal: Negative for neck pain and neck stiffness.  Skin: Negative for rash.  Neurological: Negative for dizziness, weakness, numbness and headaches.  Psychiatric/Behavioral: Positive for sleep  disturbance. Negative for agitation, behavioral problems and suicidal ideas. The patient is nervous/anxious.     Objective:   Today's Vitals: BP (!) 150/84 (BP Location: Right Arm, Patient Position: Sitting, Cuff Size: Normal)   Pulse (!) 53   Temp 98.4 F (36.9 C) (Oral)   Resp 18   Ht 5\' 6"  (1.676 m)   Wt 177 lb 12.8 oz (80.6 kg)   SpO2 98%   BMI 28.70 kg/m   Physical Exam Vitals reviewed.  Constitutional:      General: He is not in acute distress.    Appearance: He is not diaphoretic.  HENT:     Head: Normocephalic and atraumatic.     Nose: Nose normal.     Mouth/Throat:     Mouth: Mucous membranes are moist.  Eyes:     General: No scleral icterus.  Extraocular Movements: Extraocular movements intact.     Pupils: Pupils are equal, round, and reactive to light.  Cardiovascular:     Rate and Rhythm: Normal rate and regular rhythm.     Pulses: Normal pulses.     Heart sounds: Normal heart sounds. No murmur heard.   Pulmonary:     Breath sounds: Normal breath sounds. No wheezing or rales.  Abdominal:     Palpations: Abdomen is soft.     Tenderness: There is no abdominal tenderness.  Musculoskeletal:     Cervical back: Neck supple. No tenderness.     Right lower leg: No edema.     Left lower leg: No edema.  Skin:    General: Skin is warm.     Findings: No rash.  Neurological:     General: No focal deficit present.     Mental Status: He is alert and oriented to person, place, and time.     Sensory: No sensory deficit.     Motor: No weakness.  Psychiatric:        Mood and Affect: Mood normal.        Behavior: Behavior normal.     Assessment & Plan:   Problem List Items Addressed This Visit      Encounter to establish care - Primary   Care established Previous chart reviewed History and medications reviewed with the patient      Cardiovascular and Mediastinum   Chronic atrial fibrillation (HCC)    Currently in sinus rhythm Rate-controlled with  Metoprolol On Eliquis Follows up with Cardiologist      HTN (hypertension)    BP Readings from Last 1 Encounters:  07/07/20 (!) 150/84   Stable with Metoprolol and HCTZ Component of anxiety contributing to elevated BP Counseled for compliance with the medications Advised DASH diet and moderate exercise/walking, at least 150 mins/week         Genitourinary   Malignant neoplasm of prostate [facility])    S/p  Radiotherapy On Afluzosin Follows up with Urologist      Relevant Medications   meclizine (ANTIVERT) 25 MG tablet     Other   Constipation    H/o IBS-C On Miralax Follows up with GI      Nausea without vomiting    Seems to be related to vertigo Meclizine PRN Vestibular eval pending      Dizzy spells    Multiple ER visits Appears to be related to vertigo rather than cardiac etiology Meclizine PRN Follow up with ENT Vestibular rehab      Relevant Orders   For home use only DME 4 wheeled rolling walker with seat ([id number])   Chronic pain    Diffuse pain, has underlying peripheral neuropathy Some component of psychiatric history On Gabapentin and Vitamin B12 Continue Amitriptyline            Anxiety    H/o anxiety and insomnia On Valium 5 mg QD PRN Amitriptyline mainly for insomnia         Outpatient Encounter Medications as of 07/07/2020  Medication Sig  . alfuzosin (UROXATRAL) 10 MG 24 hr tablet Take 1 tablet (10 mg total) by mouth daily with breakfast.  . Ascorbic Acid (VITAMIN C) 1000 MG tablet Take 1,000 mg by mouth daily.  . Cholecalciferol (VITAMIN D3) 125 MCG (5000 UT) CAPS Take 1 capsule by mouth daily.   . Cyanocobalamin (B-12 PO) Take 1 tablet by mouth daily.   . diazepam (VALIUM) 5 MG tablet Take 5 mg  by mouth daily as needed for anxiety.   . diclofenac (CATAFLAM) 50 MG tablet Take 50 mg by mouth 2 (two) times daily.   Marland Kitchen ELIQUIS 5 MG TABS tablet TAKE 1 TABLET(5 MG) BY MOUTH TWICE DAILY (Patient taking differently: Take 5 mg by mouth 2  (two) times daily.)  . Erenumab-aooe (AIMOVIG) 70 MG/ML SOAJ Inject 1 mL into the skin every 30 (thirty) days.  Marland Kitchen gabapentin (NEURONTIN) 300 MG capsule Take 300 mg by mouth 3 (three) times daily.  . hydrochlorothiazide (HYDRODIURIL) 25 MG tablet Take 25 mg by mouth daily.  . meclizine (ANTIVERT) 25 MG tablet meclizine 25 mg tablet  TAKE 1 TABLET BY MOUTH FOUR TIMES DAILY AS NEEDED FOR DIZZINESS  . metoprolol tartrate (LOPRESSOR) 25 MG tablet Take 1 tablet (25 mg total) by mouth 2 (two) times daily.  Marland Kitchen NEXLETOL 180 MG TABS Take 180 mg by mouth daily. Pt taking 2-3 times a week  . ondansetron (ZOFRAN ODT) 4 MG disintegrating tablet Take 1 tablet (4 mg total) by mouth every 8 (eight) hours as needed for nausea or vomiting.  . pantoprazole (PROTONIX) 40 MG tablet TAKE 1 TABLET BY MOUTH DAILY 30 MINUTES BEFORE BREAKFAST (Patient taking differently: Take 40 mg by mouth See admin instructions. Take 1 tablet by mouth daily 30 minutes before breakfast)  . polyethylene glycol powder (GLYCOLAX/MIRALAX) 17 GM/SCOOP powder Take by mouth.  . potassium chloride SA (KLOR-CON) 20 MEQ tablet Take 20 mEq by mouth 2 (two) times daily.  . Probiotic Product (PROBIOTIC-10 PO) Take 1 capsule by mouth daily.  . traMADol (ULTRAM) 50 MG tablet Take 50 mg by mouth every 6 (six) hours as needed.   . Wheat Dextrin (BENEFIBER DRINK MIX PO) Take 1 oz by mouth daily.   Marland Kitchen zinc gluconate 50 MG tablet Take 50 mg by mouth daily.   . [DISCONTINUED] TROKENDI XR 100 MG CP24 Take 1 capsule by mouth daily.  . hyoscyamine (LEVSIN SL) 0.125 MG SL tablet Place under the tongue every 4 (four) hours. (Patient not taking: No sig reported)  . [DISCONTINUED] amitriptyline (ELAVIL) 10 MG tablet Take 10 mg by mouth at bedtime.  (Patient not taking: Reported on 07/07/2020)  . [DISCONTINUED] gabapentin (NEURONTIN) 600 MG tablet Take 600 mg by mouth 3 (three) times daily. (Patient not taking: Reported on 07/07/2020)  . [DISCONTINUED] ketorolac  (TORADOL) 10 MG tablet Take 10 mg by mouth every 8 (eight) hours as needed.  (Patient not taking: Reported on 07/07/2020)  . [DISCONTINUED] losartan (COZAAR) 25 MG tablet Take 25 mg by mouth 2 (two) times daily. (Patient not taking: Reported on 07/07/2020)   No facility-administered encounter medications on file as of 07/07/2020.    Follow-up: Return in about 3 months (around 10/05/2020).   Lindell Spar, MD

## 2020-07-07 NOTE — Assessment & Plan Note (Signed)
H/o anxiety and insomnia On Valium 5 mg QD PRN Amitriptyline mainly for insomnia

## 2020-07-07 NOTE — Telephone Encounter (Signed)
Noted. Cancelled pts appointment. Pt is aware.

## 2020-07-07 NOTE — Telephone Encounter (Signed)
Cancelling his appointment would be fine. In addition to his follow up with Dr. Morton Stall (general surgery with [facility]), he also has follow-up with [facility] on 07/29/20.

## 2020-07-07 NOTE — Assessment & Plan Note (Addendum)
Diffuse pain, has underlying peripheral neuropathy Some component of psychiatric history On Gabapentin and Vitamin B12 Continue Amitriptyline

## 2020-07-07 NOTE — Assessment & Plan Note (Signed)
Multiple ER visits Appears to be related to vertigo rather than cardiac etiology Meclizine PRN Follow up with ENT Vestibular rehab

## 2020-07-07 NOTE — Assessment & Plan Note (Signed)
Seems to be related to vertigo Meclizine PRN Vestibular eval pending

## 2020-07-07 NOTE — Patient Instructions (Addendum)
Please continue to take medications regularly.  Please take Meclizine as needed up to 3 times in a day for dizziness.  Please follow up with Physical therapy for your dizziness.  Please use debrox ear drops for ear wax. Please do not insert objects in the ear.  Please continue to follow up with Dr Benjamine Mola and Dr Merlene Laughter as scheduled.  Please continue to follow low salt diet.

## 2020-07-08 ENCOUNTER — Emergency Department (HOSPITAL_COMMUNITY): Payer: BC Managed Care – PPO

## 2020-07-08 ENCOUNTER — Other Ambulatory Visit: Payer: Self-pay

## 2020-07-08 ENCOUNTER — Encounter (HOSPITAL_COMMUNITY): Payer: Self-pay | Admitting: *Deleted

## 2020-07-08 ENCOUNTER — Emergency Department (HOSPITAL_COMMUNITY)
Admission: EM | Admit: 2020-07-08 | Discharge: 2020-07-08 | Disposition: A | Discharge status: Home / Self Care | Payer: BC Managed Care – PPO | Attending: Emergency Medicine | Admitting: Emergency Medicine

## 2020-07-08 DIAGNOSIS — R1031 Right lower quadrant pain: Secondary | ICD-10-CM | POA: Diagnosis not present

## 2020-07-08 DIAGNOSIS — R42 Dizziness and giddiness: Secondary | ICD-10-CM

## 2020-07-08 DIAGNOSIS — I10 Essential (primary) hypertension: Secondary | ICD-10-CM | POA: Diagnosis not present

## 2020-07-08 DIAGNOSIS — Z923 Personal history of irradiation: Secondary | ICD-10-CM | POA: Diagnosis not present

## 2020-07-08 DIAGNOSIS — Z79899 Other long term (current) drug therapy: Secondary | ICD-10-CM | POA: Insufficient documentation

## 2020-07-08 DIAGNOSIS — Z91040 Latex allergy status: Secondary | ICD-10-CM | POA: Insufficient documentation

## 2020-07-08 DIAGNOSIS — Z87891 Personal history of nicotine dependence: Secondary | ICD-10-CM | POA: Insufficient documentation

## 2020-07-08 DIAGNOSIS — Z7901 Long term (current) use of anticoagulants: Secondary | ICD-10-CM | POA: Diagnosis not present

## 2020-07-08 DIAGNOSIS — K219 Gastro-esophageal reflux disease without esophagitis: Secondary | ICD-10-CM | POA: Insufficient documentation

## 2020-07-08 DIAGNOSIS — Z8546 Personal history of malignant neoplasm of prostate: Secondary | ICD-10-CM | POA: Insufficient documentation

## 2020-07-08 LAB — COMPREHENSIVE METABOLIC PANEL
ALT: 12 U/L (ref 0–44)
AST: 15 U/L (ref 15–41)
Albumin: 4.2 g/dL (ref 3.5–5.0)
Alkaline Phosphatase: 53 U/L (ref 38–126)
Anion gap: 7 (ref 5–15)
BUN: 10 mg/dL (ref 8–23)
CO2: 33 mmol/L — ABNORMAL HIGH (ref 22–32)
Calcium: 9.9 mg/dL (ref 8.9–10.3)
Chloride: 99 mmol/L (ref 98–111)
Creatinine, Ser: 0.91 mg/dL (ref 0.61–1.24)
GFR, Estimated: >60 mL/min (ref >60)
Glucose, Bld: 98 mg/dL (ref 70–99)
Potassium: 3.4 mmol/L — ABNORMAL LOW (ref 3.5–5.1)
Sodium: 139 mmol/L (ref 135–145)
Total Bilirubin: 0.7 mg/dL (ref 0.3–1.2)
Total Protein: 7.5 g/dL (ref 6.5–8.1)

## 2020-07-08 LAB — CBC WITH DIFFERENTIAL/PLATELET
Abs Immature Granulocytes: 0.01 10*3/uL (ref 0.00–0.07)
Basophils Absolute: 0 10*3/uL (ref 0.0–0.1)
Basophils Relative: 1 %
Eosinophils Absolute: 0.1 10*3/uL (ref 0.0–0.5)
Eosinophils Relative: 3 %
HCT: 41.2 % (ref 39.0–52.0)
Hemoglobin: 13.6 g/dL (ref 13.0–17.0)
Immature Granulocytes: 0 %
Lymphocytes Relative: 48 %
Lymphs Abs: 2 10*3/uL (ref 0.7–4.0)
MCH: 30.4 pg (ref 26.0–34.0)
MCHC: 33 g/dL (ref 30.0–36.0)
MCV: 92 fL (ref 80.0–100.0)
Monocytes Absolute: 0.3 10*3/uL (ref 0.1–1.0)
Monocytes Relative: 6 %
Neutro Abs: 1.7 10*3/uL (ref 1.7–7.7)
Neutrophils Relative %: 42 %
Platelets: 209 10*3/uL (ref 150–400)
RBC: 4.48 MIL/uL (ref 4.22–5.81)
RDW: 11.9 % (ref 11.5–15.5)
WBC: 4 10*3/uL (ref 4.0–10.5)
nRBC: 0 % (ref 0.0–0.2)

## 2020-07-08 LAB — LIPASE, BLOOD: Lipase: 30 U/L (ref 11–51)

## 2020-07-08 LAB — TROPONIN I (HIGH SENSITIVITY)
Troponin I (High Sensitivity): 7 ng/L (ref <18)
Troponin I (High Sensitivity): 2 ng/L (ref <18)

## 2020-07-08 LAB — TSH: TSH: 2.899 u[IU]/mL (ref 0.350–4.500)

## 2020-07-08 MED ORDER — IOHEXOL 300 MG/ML  SOLN
100.0000 mL | Freq: Once | INTRAMUSCULAR | Status: AC | PRN
  Administered 2020-07-08: 100 mL via INTRAVENOUS

## 2020-07-08 NOTE — ED Triage Notes (Signed)
States he didn't sleep last night, history of a-fib

## 2020-07-08 NOTE — ED Provider Notes (Signed)
[facility] EMERGENCY DEPARTMENT Provider Note   CSN: [id number] Arrival date & time: 07/08/20  1114     History No chief complaint on file.   Steven [name] is a 69 y.o. male.  Patient presenting with multiple complaints.  The main complaint is dizziness.  This has been ongoing for a couple of weeks.  No true vertigo with it.  But his primary care doctor started him on meclizine.  And he also has follow-up with ear nose and throat.  Apparently has seen neurology for this has not had MRI.  Patient also complained with abdominal pain predominantly right lower quadrant.  No upper quadrant abdominal pain.  Also not being able to sleep well.  He thinks that is mostly due to the dizziness.  And is felt that he has had maybe had some arrhythmias with his heart.  But no chest pain.        Past Medical History:  Diagnosis Date  . Arthritis   . Atrial fibrillation ([place])   . Atrial flutter ([place])   . BPH (benign prostatic hyperplasia)   . Chest pain   . Dizziness   . Dyspnea    laying down occ  . Fracture 08/17/2015   MULTIPLE RIB FRACTURES     FROM FALL   . GERD (gastroesophageal reflux disease)   . Hemorrhoids   . History of kidney stones    noted on CT scan  . History of radiation therapy 08/22/11-10/13/11   prostate  . Hyperlipemia   . Hypertension   . Hyperthyroidism   . Light headedness   . Numbness and tingling in left arm   . Numbness and tingling of both legs   . Open fracture of left elbow 08/18/2015  . Prostate cancer [facility])    prostate s/p radiation Mar 2013  . Rib fractures 08/17/2015    Patient Active Problem List   Diagnosis Date Noted  . Chronic pain 07/07/2020  . Encounter to establish care 07/07/2020  . Anxiety 07/07/2020  . Carcinoma of prostate ([place]) 02/17/2020  . Subacute thyroiditis 01/07/2020  . Personal history of malignant neoplasm of prostate 12/12/2019  . Benign prostatic hyperplasia with urinary obstruction 12/12/2019  . Weak urinary stream  12/12/2019  . Hyperthyroidism 11/24/2019  . Weight loss 10/21/2019  . IBS (irritable bowel syndrome) 10/21/2019  . Dizzy spells 09/20/2019  . Intractable chronic migraine without aura 09/20/2019  . Obstructive sleep apnea syndrome 09/20/2019  . Psychophysiologic insomnia 09/20/2019  . Rectal pain 08/21/2019  . Nausea without vomiting 05/30/2019  . Atrial flutter ([place]) 04/17/2019  . Chronic atrial fibrillation ([place]) 03/02/2019  . HTN (hypertension) 03/02/2019  . Constipation 08/02/2017  . Hemorrhoids 08/02/2017  . Fall 08/18/2015  . Radiation proctitis 01/30/2013  . Rectal bleeding 12/10/2012  . Obese 05/30/2012  . Malignant neoplasm of prostate ([place]) 08/13/2011    Past Surgical History:  Procedure Laterality Date  . BOTOX INJECTION N/A 01/14/2020   Procedure: INJECTION OF BOTOX INTO ANAL SPHINCTER;  Surgeon: Ileana Roup, MD;  Location: [facility] ORS;  Service: General;  Laterality: N/A;  . COLONOSCOPY N/A 12/19/2012   MWN:[id number] 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 Normal appearing colon  . COLONOSCOPY WITH PROPOFOL N/A 10/23/2019   Procedure: COLONOSCOPY WITH PROPOFOL;  Surgeon: Daneil Dolin, MD; external and grade 2 internal hemorrhoids, abnormal rectal blood vessels consistent with radiation proctitis s/p APC therapy, otherwise normal exam.  . EVALUATION UNDER ANESTHESIA WITH ANAL FISTULECTOMY  N/A 01/14/2020   Procedure: ANORECTAL EXAM UNDER ANESTHESIA;  Surgeon: Ileana Roup, MD;  Location: [facility] ORS;  Service: General;  Laterality: N/A;  . HOT HEMOSTASIS  10/23/2019   Procedure: HOT HEMOSTASIS (ARGON PLASMA COAGULATION/BICAP);  Surgeon: Daneil Dolin, MD;  Location: [facility] ENDO SUITE;  Service: Endoscopy;;  apc rectal proctitis    . KIDNEY SURGERY  1982   kidney tube collapse repair  . RADIOACTIVE SEED IMPLANT     Prostate       Family History  Problem Relation Age of Onset  . Aneurysm Mother   .  Hypertension Mother   . Prostate cancer Father   . Hypertension Father   . Colon cancer Neg Hx   . Colon polyps Neg Hx     Social History   Tobacco Use  . Smoking status: Former Smoker    Packs/day: 1.00    Years: 20.00    Pack years: 20.00    Types: Cigarettes    Quit date: 2020    Years since quitting: 1.9  . Smokeless tobacco: Former Systems developer  . Tobacco comment: Quit smoking x 2 years    07/2015   SOMETIIMES i USE VAPOR   Vaping Use  . Vaping Use: Never used  Substance Use Topics  . Alcohol use: Never  . Drug use: Never    Home Medications Prior to Admission medications   Medication Sig Start Date End Date Taking? Authorizing Provider  alfuzosin (UROXATRAL) 10 MG 24 hr tablet Take 1 tablet (10 mg total) by mouth daily with breakfast. 03/31/20  Yes [name], Candee Furbish, MD  amitriptyline (ELAVIL) 10 MG tablet  07/07/20  Yes [provider]  Ascorbic Acid (VITAMIN C) 1000 MG tablet Take 1,000 mg by mouth daily.   Yes [provider]  Cholecalciferol (VITAMIN D3) 125 MCG (5000 UT) CAPS Take 1 capsule by mouth daily.    Yes [provider]  Cyanocobalamin (B-12 PO) Take 1 tablet by mouth daily.    Yes [provider]  diazepam (VALIUM) 5 MG tablet Take 5 mg by mouth daily as needed for anxiety.  05/13/19  Yes [provider]  diclofenac (CATAFLAM) 50 MG tablet Take 50 mg by mouth 2 (two) times daily.    Yes [provider]  ELIQUIS 5 MG TABS tablet TAKE 1 TABLET(5 MG) BY MOUTH TWICE DAILY Patient taking differently: Take 5 mg by mouth 2 (two) times daily. 03/08/20  Yes Josue Hector, MD  Erenumab-aooe (AIMOVIG) 70 MG/ML SOAJ Inject 1 mL into the skin every 30 (thirty) days.   Yes [provider]  gabapentin (NEURONTIN) 300 MG capsule Take 300 mg by mouth 3 (three) times daily. 02/24/20  Yes [provider]  hydrochlorothiazide (HYDRODIURIL) 25 MG tablet Take 25 mg by mouth daily. 07/28/19  Yes [provider]   meclizine (ANTIVERT) 25 MG tablet meclizine 25 mg tablet  TAKE 1 TABLET BY MOUTH FOUR TIMES DAILY AS NEEDED FOR DIZZINESS   Yes [provider]  metoprolol tartrate (LOPRESSOR) 25 MG tablet Take 1 tablet (25 mg total) by mouth 2 (two) times daily. 04/18/19  Yes Emokpae, Courage, MD  NEXLETOL 180 MG TABS Take 180 mg by mouth daily. Pt taking 2-3 times a week 12/03/19  Yes [provider]  ondansetron (ZOFRAN ODT) 4 MG disintegrating tablet Take 1 tablet (4 mg total) by mouth every 8 (eight) hours as needed for nausea or vomiting. 06/08/20  Yes Avegno, Darrelyn Hillock, FNP  pantoprazole (PROTONIX)  40 MG tablet TAKE 1 TABLET BY MOUTH DAILY 30 MINUTES BEFORE BREAKFAST Patient taking differently: Take 40 mg by mouth See admin instructions. Take 1 tablet by mouth daily 30 minutes before breakfast 05/11/20  Yes Annitta Needs, NP  polyethylene glycol powder (GLYCOLAX/MIRALAX) 17 GM/SCOOP powder Take 0.5 Containers by mouth daily.   Yes [provider]  potassium chloride SA (KLOR-CON) 20 MEQ tablet Take 20 mEq by mouth 2 (two) times daily. 12/09/19  Yes [provider]  Probiotic Product (PROBIOTIC-10 PO) Take 1 capsule by mouth daily.   Yes [provider]  traMADol (ULTRAM) 50 MG tablet Take 50 mg by mouth every 6 (six) hours as needed.    Yes [provider]  Wheat Dextrin (BENEFIBER DRINK MIX PO) Take 1 oz by mouth daily.    Yes [provider]  zinc gluconate 50 MG tablet Take 50 mg by mouth daily.    Yes [provider]  hyoscyamine (LEVSIN SL) 0.125 MG SL tablet Place under the tongue every 4 (four) hours. Patient not taking: No sig reported 04/30/20   [provider]    Allergies    Latex  Review of Systems   Review of Systems  Constitutional: Negative for chills and fever.  HENT: Negative for congestion, rhinorrhea and sore throat.   Eyes: Negative for visual disturbance.  Respiratory: Negative for cough and shortness  of breath.   Cardiovascular: Positive for palpitations. Negative for chest pain and leg swelling.  Gastrointestinal: Positive for abdominal pain. Negative for diarrhea, nausea and vomiting.  Genitourinary: Negative for dysuria.  Musculoskeletal: Negative for back pain and neck pain.  Skin: Negative for rash.  Neurological: Positive for dizziness. Negative for light-headedness and headaches.  Hematological: Does not bruise/bleed easily.  Psychiatric/Behavioral: Positive for sleep disturbance. Negative for confusion.    Physical Exam Updated Vital Signs BP 130/82   Pulse 60   Temp 98.2 F (36.8 C) (Oral)   Resp (!) 22   SpO2 99%   Physical Exam Vitals and nursing note reviewed.  Constitutional:      Appearance: Normal appearance. He is well-developed and well-nourished.  HENT:     Head: Normocephalic and atraumatic.  Eyes:     Extraocular Movements: Extraocular movements intact.     Conjunctiva/sclera: Conjunctivae normal.     Pupils: Pupils are equal, round, and reactive to light.  Cardiovascular:     Rate and Rhythm: Normal rate and regular rhythm.     Heart sounds: No murmur heard.   Pulmonary:     Effort: Pulmonary effort is normal. No respiratory distress.     Breath sounds: Normal breath sounds.  Abdominal:     Palpations: Abdomen is soft.     Tenderness: There is no abdominal tenderness.  Musculoskeletal:        General: No edema. Normal range of motion.     Cervical back: Normal range of motion and neck supple.  Skin:    General: Skin is warm and dry.     Capillary Refill: Capillary refill takes less than 2 seconds.  Neurological:     General: No focal deficit present.     Mental Status: He is alert and oriented to person, place, and time.     Cranial Nerves: No cranial nerve deficit.     Sensory: No sensory deficit.     Motor: No weakness.  Psychiatric:        Mood and Affect: Mood and affect normal.     ED  Results / Procedures / Treatments    Labs (all labs ordered are listed, but only abnormal results are displayed) Labs Reviewed  COMPREHENSIVE METABOLIC PANEL - Abnormal; Notable for the following components:      Result Value   Potassium 3.4 (*)    CO2 33 (*)    All other components within normal limits  CBC WITH DIFFERENTIAL/PLATELET  LIPASE, BLOOD  TSH  TROPONIN I (HIGH SENSITIVITY)  TROPONIN I (HIGH SENSITIVITY)    EKG EKG Interpretation  Date/Time:  Thursday July 08 2020 12:01:14 EST Ventricular Rate:  57 PR Interval:    QRS Duration: 90 QT Interval:  407 QTC Calculation: 397 R Axis:   16 Text Interpretation: Sinus rhythm Minimal ST elevation, anterior leads No significant change since last tracing Confirmed by Fredia Sorrow [phone]) on 07/08/2020 12:36:24 PM   Radiology MR Brain Wo Contrast (neuro protocol)  Result Date: 07/08/2020 CLINICAL DATA:  Confusion and dizziness. EXAM: MRI HEAD WITHOUT CONTRAST TECHNIQUE: Multiplanar, multiecho pulse sequences of the brain and surrounding structures were obtained without intravenous contrast. COMPARISON:  CT head June 04, 2020. FINDINGS: Brain: No acute infarction, hemorrhage, hydrocephalus, extra-axial collection or mass lesion. Very mild T2/FLAIR hyperintensities within the white matter, compatible with age-appropriate chronic microvascular ischemic disease. Vascular: Major arterial flow voids are maintained at the skull base. Diminutive vertebrobasilar system. Skull and upper cervical spine: Normal marrow signal. Sinuses/Orbits: Visualized sinuses are clear.  Unremarkable orbits. Other: No mastoid effusions. IMPRESSION: No evidence of acute intracranial abnormality. Specifically, no acute infarct. Electronically Signed   By: Margaretha Sheffield MD   On: 07/08/2020 14:15   CT Abdomen Pelvis W Contrast  Result Date: 07/08/2020 CLINICAL DATA:  RIGHT lower quadrant abdominal pain. Concern for appendicitis. EXAM: CT ABDOMEN AND PELVIS WITH CONTRAST TECHNIQUE:  Multidetector CT imaging of the abdomen and pelvis was performed using the standard protocol following bolus administration of intravenous contrast. CONTRAST:  11mL OMNIPAQUE IOHEXOL 300 MG/ML  SOLN COMPARISON:  None. FINDINGS: Lower chest: Lung bases are clear. Hepatobiliary: Small low-density lesions in the liver most consistent benign cysts. There several calcified stones within the gallbladder. No acute inflammation. Common bile duct normal caliber. Pancreas: Pancreas is normal. No ductal dilatation. No pancreatic inflammation. Spleen: Normal spleen Adrenals/urinary tract: Adrenal glands are normal. No enhancing renal cortical lesion. The LEFT ureter is dilated compared to the RIGHT ureter measuring 11 mm compared to 2 mm (image 40/2). No evidence of high-grade obstruction. Contrast fills the dilated LEFT ureter on postcontrast imaging. Delayed imaging is not continued into the pelvis. On the nephrogram phase imaging apparent caliber change in the ureter at the level the pelvic brim (image 54/5). Orthotopic insertion of the LEFT ureter. RIGHT ureter appears normal. Stomach/Bowel: Stomach, small bowel, appendix, and cecum are normal. Normal appendix on coronal image 48/5). The colon and rectosigmoid colon are normal. Vascular/Lymphatic: Abdominal aorta is normal caliber with atherosclerotic calcification. There is no retroperitoneal or periportal lymphadenopathy. No pelvic lymphadenopathy. Reproductive: Fiducial markers in the prostate Other: No free fluid. Musculoskeletal: No aggressive osseous lesion. IMPRESSION: 1. No evidence of appendicitis.  Normal appendix. 2. Cholelithiasis without evidence cholecystitis. 3. Orthotopic insertion of the LEFT ureter. Dilatation of the mid ureter with caliber change. No obstructing lesion identified. Ureters incompletely evaluated on current exam. Recommend non emergent urology consultation for evaluation LEFT ureter. Electronically Signed   By: Suzy Bouchard M.D.   On:  07/08/2020 14:06   DG Chest Port 1 View  Result Date: 07/08/2020 CLINICAL DATA:  Dizziness and  shortness of breath. EXAM: PORTABLE CHEST 1 VIEW COMPARISON:  06/23/2020 FINDINGS: 1229 hours. The cardiopericardial silhouette is within normal limits for size. Right lung clear. Minimal retrocardiac left base collapse/consolidation with probable tiny left pleural effusion. Interstitial markings are diffusely coarsened with chronic features. The visualized bony structures of the thorax show no acute abnormality. Telemetry leads overlie the chest. IMPRESSION: Retrocardiac left base collapse/consolidation with tiny left pleural effusion. Electronically Signed   By: Misty Stanley M.D.   On: 07/08/2020 12:38    Procedures Procedures (including critical care time)  Medications Ordered in ED Medications  iohexol (OMNIPAQUE) 300 MG/ML solution 100 mL (100 mLs Intravenous Contrast Given 07/08/20 1334)    ED Course  I have reviewed the triage vital signs and the nursing notes.  Pertinent labs & imaging results that were available during my care of the patient were reviewed by me and considered in my medical decision making (see chart for details).    MDM Rules/Calculators/A&P                          Patient with multiple symptoms.  Tried to approach most of them.  Further dizziness.  MRI was done showed no acute neurological source for that.  Not true vertigo.  Patient states that the meclizine is not helping very much.  Patient's troponin again is normal.  Patient had been evaluated for similar symptoms here with not quite as complete a work-up.  On December 1 and December 3.  Patient seen December 3 for near syncope December 1 for lightheadedness.  Cardiac standpoint everything looks quite normal.  Including chest x-ray.  Work-up for the right lower quadrant abdominal pain CT abdomen done without any acute findings.  Does have some gallstones.  But symptoms are not in that location.  Also no  evidence of any evidence of gallbladder infection or inflammation.  In addition patient was worried about his thyroid function.  Thyroid-stimulating hormone is in normal range.   Extensive work-up here I think the rest of work-up will need to be done by primary care provider.  And it would be reasonable for him to follow-up with ear nose and throat that has already been arranged by his primary care doctor.     Final Clinical Impression(s) / ED Diagnoses Final diagnoses:  Dizziness  Right lower quadrant abdominal pain    Rx / DC Orders ED Discharge Orders    None       Fredia Sorrow, MD 07/08/20 1520

## 2020-07-08 NOTE — ED Notes (Signed)
Patient off floor to CT at this time.

## 2020-07-08 NOTE — Discharge Instructions (Addendum)
Work-up for the dizziness to include a negative MRI without any acute findings like stroke.  Cardiac marker also normal.  Thyroid-stimulating hormone is normal.  Suggesting that your thyroid function is normal.  Make an appointment to follow-up with ear nose and throat.  Make an appointment to follow-up with your primary care doctor.  Continue tramadol at home.  Return for any new or worse symptoms

## 2020-07-15 LAB — T3, FREE: T3, Free: 3.3 pg/mL (ref 2.0–4.4)

## 2020-07-15 LAB — TSH: TSH: 3.63 u[IU]/mL (ref 0.450–4.500)

## 2020-07-15 LAB — T4, FREE: Free T4: 1.19 ng/dL (ref 0.82–1.77)

## 2020-07-20 ENCOUNTER — Encounter: Payer: Self-pay | Admitting: "Endocrinology

## 2020-07-20 ENCOUNTER — Other Ambulatory Visit: Payer: Self-pay

## 2020-07-20 ENCOUNTER — Ambulatory Visit (INDEPENDENT_AMBULATORY_CARE_PROVIDER_SITE_OTHER): Payer: BC Managed Care – PPO | Admitting: "Endocrinology

## 2020-07-20 VITALS — BP 106/64 | HR 64 | Ht 66.0 in | Wt 181.4 lb

## 2020-07-20 DIAGNOSIS — E0590 Thyrotoxicosis, unspecified without thyrotoxic crisis or storm: Secondary | ICD-10-CM | POA: Diagnosis not present

## 2020-07-20 NOTE — Progress Notes (Signed)
07/20/2020     Endocrinology follow-up note   Subjective:    Patient ID: Steven [name], male    DOB: [birth year], PCP Lindell Spar, MD.   Past Medical History:  Diagnosis Date  . Arthritis   . Atrial fibrillation ([place])   . Atrial flutter ([place])   . BPH (benign prostatic hyperplasia)   . Chest pain   . Dizziness   . Dyspnea    laying down occ  . Fracture 08/17/2015   MULTIPLE RIB FRACTURES     FROM FALL   . GERD (gastroesophageal reflux disease)   . Hemorrhoids   . History of kidney stones    noted on CT scan  . History of radiation therapy 08/22/11-10/13/11   prostate  . Hyperlipemia   . Hypertension   . Hyperthyroidism   . Light headedness   . Numbness and tingling in left arm   . Numbness and tingling of both legs   . Open fracture of left elbow 08/18/2015  . Prostate cancer [facility])    prostate s/p radiation Mar 2013  . Rib fractures 08/17/2015    Past Surgical History:  Procedure Laterality Date  . BOTOX INJECTION N/A 01/14/2020   Procedure: INJECTION OF BOTOX INTO ANAL SPHINCTER;  Surgeon: Ileana Roup, MD;  Location: [facility];  Service: General;  Laterality: N/A;  . 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 Normal appearing colon  . COLONOSCOPY WITH PROPOFOL N/A 10/23/2019   Procedure: COLONOSCOPY WITH PROPOFOL;  Surgeon: Daneil Dolin, MD; external and grade 2 internal hemorrhoids, abnormal rectal blood vessels consistent with radiation proctitis s/p APC therapy, otherwise normal exam.  . EVALUATION UNDER ANESTHESIA WITH ANAL FISTULECTOMY N/A 01/14/2020   Procedure: ANORECTAL EXAM UNDER ANESTHESIA;  Surgeon: Ileana Roup, MD;  Location: [facility];  Service: General;  Laterality: N/A;  . HOT HEMOSTASIS  10/23/2019   Procedure: HOT HEMOSTASIS (ARGON PLASMA COAGULATION/BICAP);  Surgeon: Daneil Dolin, MD;  [facility];  Service:  Endoscopy;;  apc rectal proctitis    . KIDNEY SURGERY  1982   kidney tube collapse repair  . RADIOACTIVE SEED IMPLANT     Prostate    Social History   Socioeconomic History  . Marital status: Single    Spouse name: Not on file  . Number of children: Not on file  . Years of education: Not on file  . Highest education level: Not on file  Occupational History  . Occupation: Custodian     Employer: [employer]  Tobacco Use  . Smoking status: Former Smoker    Packs/day: 1.00    Years: 20.00    Pack years: 20.00    Types: Cigarettes    Quit date: 2020    Years since quitting: 1.9  . Smokeless tobacco: Former Systems developer  . Tobacco comment: Quit smoking x 2 years    07/2015   SOMETIIMES i USE VAPOR   Vaping Use  . Vaping Use: Never used  Substance and Sexual Activity  . Alcohol use: Never  . Drug use: Never  . Sexual activity: Not Currently  Other Topics Concern  . Not on file  Social History Narrative   Divorced   Social Determinants of Health   Financial Resource Strain: Not on file  Food Insecurity: Not on file  Transportation Needs: Not on file  Physical Activity: Not on file  Stress: Not on  file  Social Connections: Not on file    Family History  Problem Relation Age of Onset  . Aneurysm Mother   . Hypertension Mother   . Prostate cancer Father   . Hypertension Father   . Colon cancer Neg Hx   . Colon polyps Neg Hx     Outpatient Encounter Medications as of 07/20/2020  Medication Sig  . alfuzosin (UROXATRAL) 10 MG 24 hr tablet Take 1 tablet (10 mg total) by mouth daily with breakfast.  . amitriptyline (ELAVIL) 10 MG tablet   . Ascorbic Acid (VITAMIN C) 1000 MG tablet Take 1,000 mg by mouth daily.  . Cholecalciferol (VITAMIN D3) 125 MCG (5000 UT) CAPS Take 1 capsule by mouth daily.   . Cyanocobalamin (B-12 PO) Take 1 tablet by mouth daily.   . diazepam (VALIUM) 5 MG tablet Take 5 mg by mouth daily as needed for anxiety.   . diclofenac (CATAFLAM) 50  MG tablet Take 50 mg by mouth 2 (two) times daily.  (Patient not taking: Reported on 07/20/2020)  . ELIQUIS 5 MG TABS tablet TAKE 1 TABLET(5 MG) BY MOUTH TWICE DAILY (Patient taking differently: Take 5 mg by mouth 2 (two) times daily.)  . Erenumab-aooe (AIMOVIG) 70 MG/ML SOAJ Inject 1 mL into the skin every 30 (thirty) days.  Marland Kitchen gabapentin (NEURONTIN) 300 MG capsule Take 300 mg by mouth 3 (three) times daily.  . hydrochlorothiazide (HYDRODIURIL) 25 MG tablet Take 25 mg by mouth daily.  . meclizine (ANTIVERT) 25 MG tablet meclizine 25 mg tablet  TAKE 1 TABLET BY MOUTH FOUR TIMES DAILY AS NEEDED FOR DIZZINESS  . metoprolol tartrate (LOPRESSOR) 25 MG tablet Take 1 tablet (25 mg total) by mouth 2 (two) times daily.  Marland Kitchen NEXLETOL 180 MG TABS Take 180 mg by mouth daily. Pt taking 2-3 times a week  . ondansetron (ZOFRAN ODT) 4 MG disintegrating tablet Take 1 tablet (4 mg total) by mouth every 8 (eight) hours as needed for nausea or vomiting.  . pantoprazole (PROTONIX) 40 MG tablet TAKE 1 TABLET BY MOUTH DAILY 30 MINUTES BEFORE BREAKFAST (Patient taking differently: Take 40 mg by mouth See admin instructions. Take 1 tablet by mouth daily 30 minutes before breakfast)  . polyethylene glycol powder (GLYCOLAX/MIRALAX) 17 GM/SCOOP powder Take 0.5 Containers by mouth daily.  . potassium chloride SA (KLOR-CON) 20 MEQ tablet Take 20 mEq by mouth 2 (two) times daily.  . Probiotic Product (PROBIOTIC-10 PO) Take 1 capsule by mouth daily.  . traMADol (ULTRAM) 50 MG tablet Take 50 mg by mouth every 6 (six) hours as needed.   . Wheat Dextrin (BENEFIBER DRINK MIX PO) Take 1 oz by mouth daily.   Marland Kitchen zinc gluconate 50 MG tablet Take 50 mg by mouth daily.   . [DISCONTINUED] hyoscyamine (LEVSIN SL) 0.125 MG SL tablet Place under the tongue every 4 (four) hours. (Patient not taking: No sig reported)   No facility-administered encounter medications on file as of 07/20/2020.    ALLERGIES: Allergies  Allergen Reactions  .  Latex Rash    Possible reaction to latex gloves per patient    VACCINATION STATUS: Immunization History  Administered Date(s) Administered  . Influenza,inj,quad, With Preservative 04/03/2019  . Influenza-Unspecified 05/26/2020  . Moderna Sars-Covid-2 Vaccination 09/07/2019, 10/12/2019     HPI  Steven [name] is 69 y.o. male who presents today with a medical history as above. he was previously, briefly treated with methimazole for transient hyperthyroidism secondary to subacute thyroiditis.  During his last visit, he  was advised to discontinue methimazole.  His previsit labs are consistent with euthyroid state.  He has no new complaints today.    -He reports resolution of his previous symptoms.  He has regained most of the weight he lost when he was symptomatic.    he denies dysphagia, choking, shortness of breath, no recent voice change.  He still has pain on the back of his neck.  he denies  family history of thyroid dysfunction. He  denies family hx of thyroid cancer. he denies personal history of goiter.                           Review of systems  Limited as above.   Objective:    BP 106/64   Pulse 64   Ht 5\' 6"  (1.676 m)   Wt 181 lb 6.4 oz (82.3 kg)   BMI 29.28 kg/m   Wt Readings from Last 3 Encounters:  07/20/20 181 lb 6.4 oz (82.3 kg)  07/07/20 177 lb 12.8 oz (80.6 kg)  06/23/20 176 lb (79.8 kg)       CMP     Component Value Date/Time   NA 139 07/08/2020 1201   NA 141 11/10/2019 1051   K 3.4 (L) 07/08/2020 1201   CL 99 07/08/2020 1201   CO2 33 (H) 07/08/2020 1201   GLUCOSE 98 07/08/2020 1201   BUN 10 07/08/2020 1201   BUN 8 04/16/2020 0000   CREATININE 0.91 07/08/2020 1201   CALCIUM 9.9 07/08/2020 1201   PROT 7.5 07/08/2020 1201   ALBUMIN 4.2 07/08/2020 1201   AST 15 07/08/2020 1201   ALT 12 07/08/2020 1201   ALKPHOS 53 07/08/2020 1201   BILITOT 0.7 07/08/2020 1201   GFRNONAA >60 07/08/2020 1201   GFRAA >60 02/19/2020 1622     CBC     Component Value Date/Time   WBC 4.0 07/08/2020 1201   RBC 4.48 07/08/2020 1201   HGB 13.6 07/08/2020 1201   HCT 41.2 07/08/2020 1201   PLT 209 07/08/2020 1201   MCV 92.0 07/08/2020 1201   MCH 30.4 07/08/2020 1201   MCHC 33.0 07/08/2020 1201   RDW 11.9 07/08/2020 1201   LYMPHSABS 2.0 07/08/2020 1201   MONOABS 0.3 07/08/2020 1201   EOSABS 0.1 07/08/2020 1201   BASOSABS 0.0 07/08/2020 1201   Lipid Panel     Component Value Date/Time   CHOL 142 04/16/2020 0000   TRIG 74 04/16/2020 0000   HDL 48 04/16/2020 0000   CHOLHDL 4.4 03/02/2019 0329   VLDL 18 03/02/2019 0329   LDLCALC 79 04/16/2020 0000     Lab Results  Component Value Date   TSH 3.630 07/14/2020   TSH 2.899 07/08/2020   TSH 2.276 05/14/2020   TSH 5.20 04/16/2020   TSH 0.301 (L) 01/01/2020   TSH 0.282 (L) 10/22/2019   TSH 0.843 03/02/2019   FREET4 1.19 07/14/2020   FREET4 1.37 01/01/2020   FREET4 1.37 (H) 10/22/2019    Thyroid uptake and scan on Nov 28, 2019 FINDINGS: There is fairly homogeneous thyroid uptake. The left lobe extends more inferiorly than the right, although no focal hot or cold nodules are identified.  4 hour I-123 uptake = 4.1% (normal 5-20%) 24 hour I-123 uptake = 12.9% (normal 10-30%)  IMPRESSION: 1. Low normal radioactive iodine uptake by the thyroid gland. 2. No focal hot or cold nodules identified.    Assessment & Plan:   1. Hyperthyroidism-resolved His previsit labs are  consistent with treatment effect, and euthyroid state.  He will not need further intervention for hyperthyroidism.  He would benefit from repeat thyroid function test in a year and will return to visit if needed.    -He will be considered for thyroid/neck ultrasound after his next visit.   -Patient is advised to maintain close follow up with Lindell Spar, MD for primary care needs.     - Time spent on this patient care encounter:  20 minutes of which 50% was spent in  counseling and the rest reviewing   his current and  previous labs / studies and medications  doses and developing a plan for long term care. [name]  participated in the discussions, expressed understanding, and voiced agreement with the above plans.  All questions were answered to his satisfaction. he is encouraged to contact clinic should he have any questions or concerns prior to his return visit.   Follow up plan: Return if thyroid labs are abnormal again.   Thank you for involving me in the care of this pleasant patient, and I will continue to update you with his progress.  Glade Lloyd, MD [facility] Phone: [phone]  Fax: [phone]   07/20/2020, 3:31 PM  This note was partially dictated with voice recognition software. Similar sounding words can be transcribed inadequately or may not  be corrected upon review.

## 2020-07-21 ENCOUNTER — Ambulatory Visit: Payer: BC Managed Care – PPO | Admitting: Gastroenterology

## 2020-07-21 ENCOUNTER — Ambulatory Visit (HOSPITAL_COMMUNITY): Payer: BC Managed Care – PPO | Admitting: Physical Therapy

## 2020-07-28 ENCOUNTER — Ambulatory Visit (HOSPITAL_COMMUNITY): Payer: Medicare Other | Attending: Surgery | Admitting: Physical Therapy

## 2020-07-28 ENCOUNTER — Telehealth: Payer: Self-pay | Admitting: Internal Medicine

## 2020-07-28 DIAGNOSIS — R42 Dizziness and giddiness: Secondary | ICD-10-CM | POA: Insufficient documentation

## 2020-07-28 DIAGNOSIS — R262 Difficulty in walking, not elsewhere classified: Secondary | ICD-10-CM | POA: Insufficient documentation

## 2020-07-28 NOTE — Telephone Encounter (Signed)
Pt has question about a medication. [phone] He said Linzess didn't help him and would it be ok for him to take trulance

## 2020-07-29 DIAGNOSIS — K581 Irritable bowel syndrome with constipation: Secondary | ICD-10-CM | POA: Diagnosis not present

## 2020-07-29 DIAGNOSIS — K602 Anal fissure, unspecified: Secondary | ICD-10-CM | POA: Diagnosis not present

## 2020-07-29 NOTE — Telephone Encounter (Signed)
Spoke with pt. Pt is aware that he needs to follow recommendations from the GI doctor at [facility].

## 2020-07-29 NOTE — Telephone Encounter (Signed)
Yes. He needs to follow recommendations per GI team he is seeing with [facility].

## 2020-07-29 NOTE — Telephone Encounter (Signed)
Spoke with pt. Pt was taking Linzess and d/c due to the diarrhea he was having. Pt would like to know if he should try the Trulance. Pt states that the GI doctor that he's seeing recommended it. Please advise.

## 2020-08-02 DIAGNOSIS — G43711 Chronic migraine without aura, intractable, with status migrainosus: Secondary | ICD-10-CM | POA: Diagnosis not present

## 2020-08-09 ENCOUNTER — Ambulatory Visit (HOSPITAL_COMMUNITY): Payer: Medicare Other | Admitting: Physical Therapy

## 2020-08-09 IMAGING — NM NM THYROID IMAGING W/ UPTAKE MULTI (4&24 HR)
4 series · 4 of 4 positions shown · non-contrast
Comparison: Limited correlation made with chest CT 08/17/2015.

CLINICAL DATA: Hyperthyroidism.  Serum TSH 0.282.

EXAM:
THYROID SCAN AND UPTAKE - 4 AND 24 HOURS
TECHNIQUE: Following oral administration of Q-X1O capsule, anterior planar
imaging was acquired at 24 hours. Thyroid uptake was calculated with
a thyroid probe at 4-6 hours and 24 hours.
RADIOPHARMACEUTICALS:  438 uCi Q-X1O sodium iodide p.o.

[Series 1: ant w marker · 1.18mm/px · 1 of 1 slices shown]
[im 1/1  full-range]
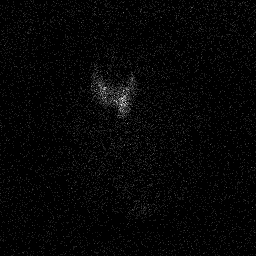

[Series 2: anterior · 1.18mm/px · 1 of 1 slices shown]
[im 1/1  full-range]
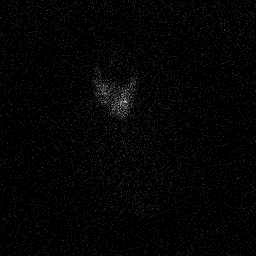

[Series 3: lao · 1.18mm/px · 1 of 1 slices shown]
[im 1/1]
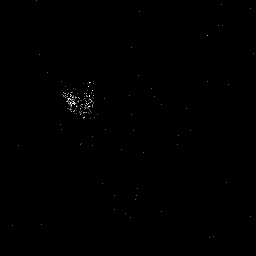

[Series 4: rao · 1.18mm/px · 1 of 1 slices shown]
[im 1/1  full-range]
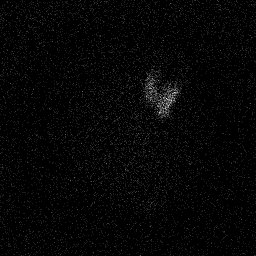

[4 of 4 positions shown; findings below may reference images not displayed]

FINDINGS: There is fairly homogeneous thyroid uptake. The left lobe extends
more inferiorly than the right, although no focal hot or cold
nodules are identified.

4 hour Q-X1O uptake = 4.1% (normal 5-20%)

24 hour Q-X1O uptake = 12.9% (normal 10-30%)
IMPRESSION: 1. Low normal radioactive iodine uptake by the thyroid gland.
2. No focal hot or cold nodules identified.

## 2020-08-10 DIAGNOSIS — K6289 Other specified diseases of anus and rectum: Secondary | ICD-10-CM | POA: Diagnosis not present

## 2020-08-10 DIAGNOSIS — K602 Anal fissure, unspecified: Secondary | ICD-10-CM | POA: Diagnosis not present

## 2020-08-11 ENCOUNTER — Ambulatory Visit: Payer: BC Managed Care – PPO | Admitting: Urology

## 2020-08-13 ENCOUNTER — Ambulatory Visit (HOSPITAL_COMMUNITY): Payer: Medicare Other | Admitting: Physical Therapy

## 2020-08-19 ENCOUNTER — Ambulatory Visit (HOSPITAL_COMMUNITY): Payer: Medicare Other | Admitting: Physical Therapy

## 2020-08-19 ENCOUNTER — Encounter (HOSPITAL_COMMUNITY): Payer: Self-pay | Admitting: Physical Therapy

## 2020-08-19 ENCOUNTER — Other Ambulatory Visit: Payer: Self-pay

## 2020-08-19 DIAGNOSIS — R262 Difficulty in walking, not elsewhere classified: Secondary | ICD-10-CM | POA: Diagnosis not present

## 2020-08-19 DIAGNOSIS — R42 Dizziness and giddiness: Secondary | ICD-10-CM | POA: Diagnosis not present

## 2020-08-19 NOTE — Therapy (Signed)
[place], [place], [address] Phone: [phone]   Fax:  [phone]  Physical Therapy Evaluation  Patient Details  Name: Steven [name] MRN: [id number] Date of Birth: May 16, 1951 Referring Provider (PT): su teoh   Encounter Date: 08/19/2020   PT End of Session - 08/19/20 1530    Visit Number 1    Number of Visits 1    Date for PT Re-Evaluation 08/26/20    Authorization Type [employer] Medicare    Progress Note Due on Visit --    PT Start Time 1530    PT Stop Time 1600    PT Time Calculation (min) 30 min    Activity Tolerance Patient tolerated treatment well    Behavior During Therapy [facility] for tasks assessed/performed           Past Medical History:  Diagnosis Date  . Arthritis   . Atrial fibrillation ([place])   . Atrial flutter ([place])   . BPH (benign prostatic hyperplasia)   . Chest pain   . Dizziness   . Dyspnea    laying down occ  . Fracture 08/17/2015   MULTIPLE RIB FRACTURES     FROM FALL   . GERD (gastroesophageal reflux disease)   . Hemorrhoids   . History of kidney stones    noted on CT scan  . History of radiation therapy 08/22/11-10/13/11   prostate  . Hyperlipemia   . Hypertension   . Hyperthyroidism   . Light headedness   . Numbness and tingling in left arm   . Numbness and tingling of both legs   . Open fracture of left elbow 08/18/2015  . Prostate cancer [facility])    prostate s/p radiation Mar 2013  . Rib fractures 08/17/2015    Past Surgical History:  Procedure Laterality Date  . BOTOX INJECTION N/A 01/14/2020   Procedure: INJECTION OF BOTOX INTO ANAL SPHINCTER;  Surgeon: Ileana Roup, MD;  Location[facility];  Service: General;  Laterality: N/A;  . COLONOSCOPY N/A 12/19/2012   [id number] 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 Normal appearing colon  . COLONOSCOPY WITH PROPOFOL N/A 10/23/2019   Procedure: COLONOSCOPY WITH  PROPOFOL;  Surgeon: Daneil Dolin, MD; external and grade 2 internal hemorrhoids, abnormal rectal blood vessels consistent with radiation proctitis s/p APC therapy, otherwise normal exam.  . EVALUATION UNDER ANESTHESIA WITH ANAL FISTULECTOMY N/A 01/14/2020   Procedure: ANORECTAL EXAM UNDER ANESTHESIA;  Surgeon: Ileana Roup, MD;  [facility];  Service: General;  Laterality: N/A;  . HOT HEMOSTASIS  10/23/2019   Procedure: HOT HEMOSTASIS (ARGON PLASMA COAGULATION/BICAP);  Surgeon: Daneil Dolin, MD;  [facility];  Service: Endoscopy;;  apc rectal proctitis    . KIDNEY SURGERY  1982   kidney tube collapse repair  . RADIOACTIVE SEED IMPLANT     Prostate    There were no vitals filed for this visit.    Subjective Assessment - 08/19/20 1538    Subjective Patient reports he has been having dizziness off and on over a year. States that he has nausea and lighted headedness. State that he thinks it's coming from his IBS. States he has gone to the ED multiple times and they couldn't find anything. States that he has also been to 2 different GI doctors but they don't want to do surgery. States he needs to find another GI MD. States that his dizziness is worse with  standing and walking and riding in a car. Reports no positional changes that alter his dizziness. STates he had an extensive ear test with no findings and imaging with no findings. States that when he needs to have a bowel movement that's when he feels the dizziness.    Currently in Pain? No/denies    Pain Score 0-No pain    Pain Location --   dizziness   Pain Frequency Intermittent              [facility] PT Assessment - 08/19/20 0001      Assessment   Medical Diagnosis dizziness    Referring Provider (PT) su teoh      Balance Screen   Has the patient fallen in the past 6 months No                  Vestibular Assessment - 08/19/20 0001      Oculomotor Exam   Smooth Pursuits --   over shoots, no  dizziness   Saccades Intact   no symptoms.     Oculomotor Exam-Fixation Suppressed    Left Head Impulse negative    Right Head Impulse negative    Head Shaking Nystagmus-Horizontal negative      Other Tests   Comments balanfce with eyes closed narrow base of support - no symptoms only mild sway      Positional Testing   Dix-Hallpike Dix-Hallpike Left;Dix-Hallpike Right      Dix-Hallpike Right   Dix-Hallpike Right Duration negative    Dix-Hallpike Right Symptoms No nystagmus      Dix-Hallpike Left   Dix-Hallpike Left Duration negative    Dix-Hallpike Left Symptoms No nystagmus      Positional Sensitivities   Sit to Supine No dizziness    Supine to Left Side No dizziness    Supine to Right Side No dizziness    Supine to Sitting No dizziness    Right Hallpike No dizziness    Up from Right Hallpike No dizziness    Up from Left Hallpike No dizziness    Head Turning x 5 No dizziness    Head Nodding x 5 No dizziness    Pivot Right in Standing No dizziness    Pivot Left in Standing No dizziness    Rolling Right No dizziness    Rolling Left No dizziness              Objective measurements completed on examination: See above findings.               PT Education - 08/19/20 1558    Education Details on following up with MD, findings and no PT needs for dizziness noted at this time.    Person(s) Educated Patient    Methods Explanation    Comprehension Verbalized understanding            PT Short Term Goals - 08/19/20 1557      PT SHORT TERM GOAL #1   Title NA             PT Long Term Goals - 08/19/20 1559      PT LONG TERM GOAL #1   Title NA                  Plan - 08/19/20 1555    Clinical Impression Statement Patient presents to therapy with complaints of dizziness are at their peak right before he has to have a bowel movement. No symptoms noted during today's session  with positional changes or eye and head movements. Educated patient  on findings and to follow up with MD secondary to no PT needs noted for dizziness at this time. One time visit secondary to findings.    Personal Factors and Comorbidities --    Examination-Activity Limitations Stand;Locomotion Level    Examination-Participation Restrictions Driving    Stability/Clinical Decision Making Stable/Uncomplicated    Clinical Decision Making Low    Rehab Potential Poor    PT Frequency One time visit    PT Treatment/Interventions ADLs/Self Care Home Management;Balance training;Patient/family education    PT Next Visit Plan one time visit    PT Home Exercise Plan on following up with GI MD, cardiologist, and getting eyes checked    Consulted and Agree with Plan of Care Patient           Patient will benefit from skilled therapeutic intervention in order to improve the following deficits and impairments:  Dizziness  Visit Diagnosis: Dizziness and giddiness     Problem List Patient Active Problem List   Diagnosis Date Noted  . Chronic pain 07/07/2020  . Encounter to establish care 07/07/2020  . Anxiety 07/07/2020  . Carcinoma of prostate ([place]) 02/17/2020  . Subacute thyroiditis 01/07/2020  . Personal history of malignant neoplasm of prostate 12/12/2019  . Benign prostatic hyperplasia with urinary obstruction 12/12/2019  . Weak urinary stream 12/12/2019  . Hyperthyroidism 11/24/2019  . Weight loss 10/21/2019  . IBS (irritable bowel syndrome) 10/21/2019  . Dizzy spells 09/20/2019  . Intractable chronic migraine without aura 09/20/2019  . Obstructive sleep apnea syndrome 09/20/2019  . Psychophysiologic insomnia 09/20/2019  . Rectal pain 08/21/2019  . Nausea without vomiting 05/30/2019  . Atrial flutter ([place]) 04/17/2019  . Chronic atrial fibrillation ([place]) 03/02/2019  . HTN (hypertension) 03/02/2019  . Constipation 08/02/2017  . Hemorrhoids 08/02/2017  . Fall 08/18/2015  . Radiation proctitis 01/30/2013  . Rectal bleeding 12/10/2012  . Obese  05/30/2012  . Malignant neoplasm of prostate ([place]) 08/13/2011    4:03 PM, 08/19/20 Jerene Pitch, DPT Physical Therapy with [facility]  [phone] office  [place] [address] [place], [place], [address] Phone: [phone]   Fax:  [phone]  Name: LISSANDRO CROCKER MRN: [id number] Date of Birth: 28-Mar-1951

## 2020-08-24 ENCOUNTER — Telehealth: Payer: Self-pay | Admitting: Cardiovascular Disease

## 2020-08-24 MED ORDER — APIXABAN 5 MG PO TABS: 5.0000 mg | ORAL_TABLET | Freq: Two times a day (BID) | ORAL | 6 refills | Status: DC

## 2020-08-24 NOTE — Telephone Encounter (Signed)
New message      Patient waiting on authorization for his Eliquis  - pharmacy said that it needs preauthorization ?    He is out of medication needs samples    Patient calling the office for samples of medication:   1.  What medication and dosage are you requesting samples for? eliquis 5mg  2x a day   2.  Are you currently out of this medication?  Yes

## 2020-08-24 NOTE — Telephone Encounter (Signed)
Spoke with pt and he agrees to try for the [employer] patient assistance. Pt will bring proof of income and fill out application.

## 2020-08-24 NOTE — Telephone Encounter (Signed)
PA being worked on now   Samples eliquis 5 mg # 28, lot [id number], exp 10/2022   Pt notified

## 2020-08-24 NOTE — Telephone Encounter (Signed)
New message     Patient wants to know if there is something cheaper he can take other than the eliquis

## 2020-08-25 ENCOUNTER — Telehealth: Payer: Self-pay | Admitting: *Deleted

## 2020-08-25 NOTE — Telephone Encounter (Signed)
Prior aurth. Completed and approved for Eliquis through 08/24/2021.

## 2020-09-02 DIAGNOSIS — G43711 Chronic migraine without aura, intractable, with status migrainosus: Secondary | ICD-10-CM | POA: Diagnosis not present

## 2020-09-03 ENCOUNTER — Telehealth: Payer: Self-pay | Admitting: *Deleted

## 2020-09-03 NOTE — Telephone Encounter (Signed)
Spoke with pt and informed him that his out of pocket before [employer] will approve him for the foundation for Eliquis is $376.36.

## 2020-09-03 NOTE — Telephone Encounter (Signed)
Spoke with pt and he states that he is having chest pain on the left side at night when he lays down. Pt states he is unable to do ADL without getting SOB. Pt reports that when he is shaving, brushing his teeth he has nausea. States that when he takes a breath the chest pain is worse.

## 2020-09-03 NOTE — Telephone Encounter (Signed)
He has no known CAD and has frequent ER visits with panic attacks Pain does not sound cardiac  And he has normal EF would start by seeing primary

## 2020-09-06 NOTE — Telephone Encounter (Signed)
Pt notified and states that he will make an appt with his PCP.

## 2020-09-20 ENCOUNTER — Ambulatory Visit (INDEPENDENT_AMBULATORY_CARE_PROVIDER_SITE_OTHER): Payer: Medicare Other | Admitting: Urology

## 2020-09-20 ENCOUNTER — Other Ambulatory Visit: Payer: Self-pay

## 2020-09-20 ENCOUNTER — Encounter: Payer: Self-pay | Admitting: Urology

## 2020-09-20 VITALS — BP 134/78 | HR 56 | Temp 99.1°F | Ht 66.0 in | Wt 190.0 lb

## 2020-09-20 DIAGNOSIS — N138 Other obstructive and reflux uropathy: Secondary | ICD-10-CM | POA: Diagnosis not present

## 2020-09-20 DIAGNOSIS — N411 Chronic prostatitis: Secondary | ICD-10-CM | POA: Diagnosis not present

## 2020-09-20 DIAGNOSIS — N401 Enlarged prostate with lower urinary tract symptoms: Secondary | ICD-10-CM

## 2020-09-20 DIAGNOSIS — R3912 Poor urinary stream: Secondary | ICD-10-CM

## 2020-09-20 DIAGNOSIS — Z8546 Personal history of malignant neoplasm of prostate: Secondary | ICD-10-CM

## 2020-09-20 LAB — URINALYSIS, ROUTINE W REFLEX MICROSCOPIC
Bilirubin, UA: NEGATIVE
Glucose, UA: NEGATIVE
Ketones, UA: NEGATIVE
Leukocytes,UA: NEGATIVE
Nitrite, UA: NEGATIVE
Protein,UA: NEGATIVE
RBC, UA: NEGATIVE
Specific Gravity, UA: 1.015 (ref 1.005–1.030)
Urobilinogen, Ur: 0.2 mg/dL (ref 0.2–1.0)
pH, UA: 5.5 (ref 5.0–7.5)

## 2020-09-20 MED ORDER — TADALAFIL 20 MG PO TABS: 20.0000 mg | ORAL_TABLET | Freq: Every day | ORAL | 5 refills | Status: DC | PRN

## 2020-09-20 MED ORDER — MIRABEGRON ER 25 MG PO TB24: 25.0000 mg | ORAL_TABLET | Freq: Every day | ORAL | 0 refills | Status: DC

## 2020-09-20 NOTE — Patient Instructions (Signed)
Prostatitis  Prostatitis is swelling of the prostate gland, also called the prostate. This gland is about 1.5 inches wide and 1 inch high, and it helps to make a fluid called semen. The prostate is below a man's bladder, in front of the butt (rectum). There are different types of prostatitis. What are the causes? One type of prostatitis is caused by an infection from germs (bacteria). Another type is not caused by germs. It may be caused by:  Things having to do with the nervous system. This system includes thebrain, spinal cord, and nerves.  An autoimmune response. This happens when the body's disease-fighting system attacks healthy tissue in the body by mistake.  Psychological factors. These have to do with how the mind works. The causes of other types of prostatitis are normally not known. What are the signs or symptoms? Symptoms of this condition depend on the type of prostatitis you have. If your condition is caused by germs:  You may feel pain or burning when you pee (urinate).  You may pee often and all of a sudden.  You may have problems starting to pee.  You may have trouble emptying your bladder when you pee.  You may have fever or chills.  You may feel pain in your muscles, joints, low back, or lower belly. If you have other types of prostatitis:  You may pee often or all of a sudden.  You may have trouble starting to pee.  You may have a weak flow when you pee.  You may leak pee after using the bathroom.  You may have other problems, such as: ? Abnormal fluid coming from the penis. ? Pain in the testicles or penis. ? Pain between the butt and the testicles. ? Pain when fluid comes out of the penis during sex. How is this treated? Treatment for this condition depends on the type of prostatitis. Treatment may include:  Medicines. These may treat pain or swelling, or they may help relax muscles.  Exercises to help you move better or get stronger (physical  therapy).  Heat therapy.  Techniques to help you control some of the ways that your body works.  Exercises to help you relax.  Antibiotic medicine, if your condition is caused by germs.  Warm water baths (sitz baths) to relax muscles. Follow these instructions at home: Medicines  Take over-the-counter and prescription medicines only as told by your doctor.  If you were prescribed an antibiotic medicine, take it as told by your doctor. Do not stop using the antibiotic even if you start to feel better. Managing pain and swelling  Take sitz baths as told by your doctor. For a sitz bath, sit in warm water that is deep enough to cover your hips and butt.  If told, put heat on the painful area. Do this as often as told by your doctor. Use the heat source that your doctor recommends, such as a moist heat pack or a heating pad. ? Place a towel between your skin and the heat source. ? Leave the heat on for 20-30 minutes. ? Take off the heat if your skin turns bright red. This is very important if you are unable to feel pain, heat, or cold. You may have a greater risk of getting burned.   General instructions  Do exercises as told by your doctor, if your doctor prescribed them.  Keep all follow-up visits as told by your doctor. This is important. Where to find more information  [employer]  of Diabetes and Digestive and Kidney Diseases: http://www.bass.com/ Contact a doctor if:  Your symptoms get worse.  You have a fever. Get help right away if:  You have chills.  You feel light-headed.  You feel like you may faint.  You cannot pee.  You have blood or clumps of blood (blood clots) in your pee. Summary  Prostatitis is swelling of the prostate gland.  There are different types of prostatitis. Treatment depends on the type that you have.  Take over-the-counter and prescription medicines only as told by your doctor.  Get help right away of you have chills, feel  light-headed, or feel like you may faint. Also get help right away if you cannot pee or you have blood or clumps of blood in your pee. This information is not intended to replace advice given to you by your health care provider. Make sure you discuss any questions you have with your health care provider. Document Revised: 08/15/2019 Document Reviewed: 08/15/2019 Elsevier Patient Education  2021 [employer].

## 2020-09-20 NOTE — Addendum Note (Signed)
Addended by: Cleon Gustin on: 09/20/2020 04:36 PM   Modules accepted: Orders

## 2020-09-20 NOTE — Progress Notes (Signed)
Urological Symptom Review  Patient is experiencing the following symptoms: None    Review of Systems  Gastrointestinal (upper)  : Negative for upper GI symptoms  Gastrointestinal (lower) : Negative for lower GI symptoms  Constitutional : Negative for symptoms  Skin: Negative for skin symptoms  Eyes: Negative for eye symptoms  Ear/Nose/Throat : Negative for Ear/Nose/Throat symptoms  Hematologic/Lymphatic: Negative for Hematologic/Lymphatic symptoms  Cardiovascular : Negative for cardiovascular symptoms  Respiratory : Negative for respiratory symptoms  Endocrine: Negative for endocrine symptoms  Musculoskeletal: Negative for musculoskeletal symptoms  Neurological: Dizziness  Psychologic: Anxiety

## 2020-09-20 NOTE — Progress Notes (Signed)
09/20/2020 4:25 PM   [name] [birth year] [id number]  Referring provider: Jake Samples, PA-C [address] [place],  [place] [address]  followup chronic prostatitis  HPI: Mr Sedano is a 70yo here for followup for chronic prostatitis and B{H with weak urinary stream. The uroxatral 10mg  has improved his urination and have resolved his dysuria. Stream strong. Nocturia 2x. He gets flares of pelvic pain that radiates to the glans.  IPSS 5 and QOL 1. He drinks 64oz of water daily.  He continues to have pain with bowel movement. He is being evaluated by [facility] for an anal fissure.    PMH: Past Medical History:  Diagnosis Date  . Arthritis   . Atrial fibrillation ([place])   . Atrial flutter ([place])   . BPH (benign prostatic hyperplasia)   . Chest pain   . Dizziness   . Dyspnea    laying down occ  . Fracture 08/17/2015   MULTIPLE RIB FRACTURES     FROM FALL   . GERD (gastroesophageal reflux disease)   . Hemorrhoids   . History of kidney stones    noted on CT scan  . History of radiation therapy 08/22/11-10/13/11   prostate  . Hyperlipemia   . Hypertension   . Hyperthyroidism   . Light headedness   . Numbness and tingling in left arm   . Numbness and tingling of both legs   . Open fracture of left elbow 08/18/2015  . Prostate cancer [facility])    prostate s/p radiation Mar 2013  . Rib fractures 08/17/2015    Surgical History: Past Surgical History:  Procedure Laterality Date  . BOTOX INJECTION N/A 01/14/2020   Procedure: INJECTION OF BOTOX INTO ANAL SPHINCTER;  Surgeon: Ileana Roup, MD;  Location: [facility] ORS;  Service: General;  Laterality: N/A;  . COLONOSCOPY N/A 12/19/2012   JOA:CZYSAY 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 Normal appearing colon  . COLONOSCOPY WITH PROPOFOL N/A 10/23/2019   Procedure: COLONOSCOPY WITH PROPOFOL;  Surgeon: Daneil Dolin, MD; external and grade 2 internal  hemorrhoids, abnormal rectal blood vessels consistent with radiation proctitis s/p APC therapy, otherwise normal exam.  . EVALUATION UNDER ANESTHESIA WITH ANAL FISTULECTOMY N/A 01/14/2020   Procedure: ANORECTAL EXAM UNDER ANESTHESIA;  Surgeon: Ileana Roup, MD;  Location: [facility] ORS;  Service: General;  Laterality: N/A;  . HOT HEMOSTASIS  10/23/2019   Procedure: HOT HEMOSTASIS (ARGON PLASMA COAGULATION/BICAP);  Surgeon: Daneil Dolin, MD;  Location: [facility] SU[facility];  Service: Endoscopy;;  apc rectal proctitis    . KIDNEY SURGERY  1982   kidney tube collapse repair  . RADIOACTIVE SEED IMPLANT     Prostate    Home Medications:  Allergies as of 09/20/2020      Reactions   Latex Rash   Possible reaction to latex gloves per patient      Medication List       Accurate as of September 20, 2020  4:25 PM. If you have any questions, ask your nurse or doctor.        Aimovig 70 MG/ML So[place] Generic drug: Erenumab-aooe Inject 1 mL into the skin every 30 (thirty) days.   alfuzosin 10 MG 24 hr tablet Commonly known as: UROXATRAL Take 1 tablet (10 mg total) by mouth daily with breakfast.   amitriptyline 10 MG tablet Commonly known as: ELAVIL   apixaban 5 MG Tabs tablet Commonly known as: Eliquis Take 1 tablet (5 mg  total) by mouth 2 (two) times daily.   B-12 PO Take 1 tablet by mouth daily.   BENEFIBER DRINK MIX PO Take 1 oz by mouth daily.   diazepam 5 MG tablet Commonly known as: VALIUM Take 5 mg by mouth daily as needed for anxiety.   diclofenac 50 MG tablet Commonly known as: CATAFLAM Take 50 mg by mouth 2 (two) times daily.   gabapentin 300 MG capsule Commonly known as: NEURONTIN Take 300 mg by mouth 3 (three) times daily.   hydrochlorothiazide 25 MG tablet Commonly known as: HYDRODIURIL Take 25 mg by mouth daily.   meclizine 25 MG tablet Commonly known as: ANTIVERT meclizine 25 mg tablet  TAKE 1 TABLET BY MOUTH FOUR TIMES DAILY AS NEEDED FOR DIZZINESS    metoprolol tartrate 25 MG tablet Commonly known as: LOPRESSOR Take 1 tablet (25 mg total) by mouth 2 (two) times daily.   Nexletol 180 MG Tabs Generic drug: Bempedoic Acid Take 180 mg by mouth daily. Pt taking 2-3 times a week   ondansetron 4 MG disintegrating tablet Commonly known as: Zofran ODT Take 1 tablet (4 mg total) by mouth every 8 (eight) hours as needed for nausea or vomiting.   pantoprazole 40 MG tablet Commonly known as: PROTONIX TAKE 1 TABLET BY MOUTH DAILY 30 MINUTES BEFORE BREAKFAST What changed: See the new instructions.   polyethylene glycol powder 17 GM/SCOOP powder Commonly known as: GLYCOLAX/MIRALAX Take 0.5 Containers by mouth daily.   potassium chloride SA 20 MEQ tablet Commonly known as: KLOR-CON Take 20 mEq by mouth 2 (two) times daily.   PROBIOTIC-10 PO Take 1 capsule by mouth daily.   traMADol 50 MG tablet Commonly known as: ULTRAM Take 50 mg by mouth every 6 (six) hours as needed.   vitamin C 1000 MG tablet Take 1,000 mg by mouth daily.   Vitamin D3 125 MCG (5000 UT) Caps Take 1 capsule by mouth daily.   zinc gluconate 50 MG tablet Take 50 mg by mouth daily.       Allergies:  Allergies  Allergen Reactions  . Latex Rash    Possible reaction to latex gloves per patient    Family History: Family History  Problem Relation Age of Onset  . Aneurysm Mother   . Hypertension Mother   . Prostate cancer Father   . Hypertension Father   . Colon cancer Neg Hx   . Colon polyps Neg Hx     Social History:  reports that he quit smoking about 2 years ago. His smoking use included cigarettes. He has a 20.00 pack-year smoking history. He has quit using smokeless tobacco. He reports that he does not drink alcohol and does not use drugs.  ROS: All other review of systems were reviewed and are negative except what is noted above in HPI  Physical Exam: BP 134/78   Pulse (!) 56   Temp 99.1 F (37.3 C)   Ht 5\' 6"  (1.676 m)   Wt 190 lb (86.2  kg)   BMI 30.67 kg/m   Constitutional:  Alert and oriented, No acute distress. HEENT: [place] AT, moist mucus membranes.  Trachea midline, no masses. Cardiovascular: No clubbing, cyanosis, or edema. Respiratory: Normal respiratory effort, no increased work of breathing. GI: Abdomen is soft, nontender, nondistended, no abdominal masses GU: No CVA tenderness.  Lymph: No cervical or inguinal lymphadenopathy. Skin: No rashes, bruises or suspicious lesions. Neurologic: Grossly intact, no focal deficits, moving all 4 extremities. Psychiatric: Normal mood and affect.  Laboratory Data:  Lab Results  Component Value Date   WBC 4.0 07/08/2020   HGB 13.6 07/08/2020   HCT 41.2 07/08/2020   MCV 92.0 07/08/2020   PLT 209 07/08/2020    Lab Results  Component Value Date   CREATININE 0.91 07/08/2020    Lab Results  Component Value Date   PSA 1.8 12/10/2019   PSA 2.3 11/10/2019    No results found for: TESTOSTERONE  No results found for: HGBA1C  Urinalysis    Component Value Date/Time   COLORURINE COLORLESS (A) 06/23/2020 1515   APPEARANCEUR CLEAR 06/23/2020 1515   APPEARANCEUR Clear 03/31/2020 1115   LABSPEC 1.004 (L) 06/23/2020 1515   PHURINE 6.0 06/23/2020 1515   GLUCOSEU NEGATIVE 06/23/2020 1515   HGBUR NEGATIVE 06/23/2020 1515   [place] 06/23/2020 1515   BILIRUBINUR Negative 03/31/2020 1115   KETONESUR NEGATIVE 06/23/2020 1515   PROTEINUR NEGATIVE 06/23/2020 1515   UROBILINOGEN negative (A) 12/12/2019 1113   UROBILINOGEN 1.0 08/03/2012 1156   NITRITE NEGATIVE 06/23/2020 1515   LEUKOCYTESUR NEGATIVE 06/23/2020 1515    Lab Results  Component Value Date   LABMICR Comment 03/31/2020   WBCUA None seen 03/02/2020   LABEPIT None seen 03/02/2020   BACTERIA None seen 03/02/2020    Pertinent Imaging:  Results for orders placed during the hospital encounter of 11/07/19  DG Abdomen 1 View  Narrative CLINICAL DATA:  Constipation. Hemorrhoids. Irritable bowel  syndrome. Prostate cancer.  EXAM: ABDOMEN - 1 VIEW  COMPARISON:  CT pelvis 09/25/2019 in CT abdomen 04/29/2019  FINDINGS: Oval-shaped densities along the midline of the upper abdomen, possibly in the stomach. The patient had gallstones on the 04/29/2019 exam but these densities appear more regular and more medially located than expected location for gallstones. There is another oval-shaped density projecting over the transverse colon and 1 along the rectum, again I suspect that these are within stomach/bowel.  The tiny punctate right kidney lower pole renal calculus shown on 04/29/2019 is not readily seen on today's conventional radiographs.  Dextroconvex lumbar scoliosis with rotary component. No dilated bowel. Bowel gas pattern appears otherwise unremarkable.  IMPRESSION: 1. Oval-shaped densities in the abdomen are thought to be in the stomach and bowel. 2. No dilated bowel.  Unremarkable bowel gas pattern. 3. Dextroconvex lumbar scoliosis with rotary component.   Electronically Signed By: Van Clines M.D. On: 11/07/2019 17:35  No results found for this or any previous visit.  No results found for this or any previous visit.  No results found for this or any previous visit.  No results found for this or any previous visit.  No results found for this or any previous visit.  No results found for this or any previous visit.  No results found for this or any previous visit.   Assessment & Plan:    1. Benign prostatic hyperplasia with urinary obstruction -uroxatral 10mg  qhs and we will add mirabegron 25mg  QOD - Urinalysis, Routine w reflex microscopic  2. Chronic prostatitis -continue uroxatral 10mg  qhs  3. Weak urinary stream Continue uroxatral 10mg  qhs   No follow-ups on file.  Nicolette Bang, MD  [facility]

## 2020-09-21 LAB — PSA: Prostate Specific Ag, Serum: 2.4 ng/mL (ref 0.0–4.0)

## 2020-09-21 NOTE — Progress Notes (Signed)
Sent via mychart

## 2020-09-22 DIAGNOSIS — K6289 Other specified diseases of anus and rectum: Secondary | ICD-10-CM | POA: Diagnosis not present

## 2020-09-22 DIAGNOSIS — K581 Irritable bowel syndrome with constipation: Secondary | ICD-10-CM | POA: Diagnosis not present

## 2020-09-22 DIAGNOSIS — L290 Pruritus ani: Secondary | ICD-10-CM | POA: Diagnosis not present

## 2020-10-04 DIAGNOSIS — G990 Autonomic neuropathy in diseases classified elsewhere: Secondary | ICD-10-CM | POA: Diagnosis not present

## 2020-10-04 DIAGNOSIS — G43711 Chronic migraine without aura, intractable, with status migrainosus: Secondary | ICD-10-CM | POA: Diagnosis not present

## 2020-10-04 DIAGNOSIS — R42 Dizziness and giddiness: Secondary | ICD-10-CM | POA: Diagnosis not present

## 2020-10-04 DIAGNOSIS — I482 Chronic atrial fibrillation, unspecified: Secondary | ICD-10-CM | POA: Diagnosis not present

## 2020-10-04 DIAGNOSIS — F5104 Psychophysiologic insomnia: Secondary | ICD-10-CM | POA: Diagnosis not present

## 2020-10-04 DIAGNOSIS — I951 Orthostatic hypotension: Secondary | ICD-10-CM | POA: Diagnosis not present

## 2020-10-05 ENCOUNTER — Ambulatory Visit: Payer: BC Managed Care – PPO | Admitting: Internal Medicine

## 2020-10-22 DIAGNOSIS — M47812 Spondylosis without myelopathy or radiculopathy, cervical region: Secondary | ICD-10-CM | POA: Diagnosis not present

## 2020-10-22 DIAGNOSIS — I10 Essential (primary) hypertension: Secondary | ICD-10-CM | POA: Diagnosis not present

## 2020-10-22 DIAGNOSIS — Z6831 Body mass index (BMI) 31.0-31.9, adult: Secondary | ICD-10-CM | POA: Diagnosis not present

## 2020-11-01 DIAGNOSIS — G43711 Chronic migraine without aura, intractable, with status migrainosus: Secondary | ICD-10-CM | POA: Diagnosis not present

## 2020-11-02 NOTE — Progress Notes (Signed)
Subjective:   Steven [name] is a 70 y.o. male who presents for an Initial Medicare Annual Wellness Visit.  I connected with [name]  today by telephone and verified that I am speaking with the correct person using two identifiers. Location patient: home Location provider: work Persons participating in the virtual visit: patient, provider.   I discussed the limitations, risks, security and privacy concerns of performing an evaluation and management service by telephone and the availability of in person appointments. I also discussed with the patient that there may be a patient responsible charge related to this service. The patient expressed understanding and verbally consented to this telephonic visit.    Interactive audio and video telecommunications were attempted between this provider and patient, however failed, due to patient having technical difficulties OR patient did not have access to video capability.  We continued and completed visit with audio only.      Review of Systems    N/A  Cardiac Risk Factors include: advanced age (>72men, >6 women);hypertension;male gender     Objective:    Today's Vitals   There is no height or weight on file to calculate BMI.  Advanced Directives 11/03/2020 08/19/2020 07/08/2020 06/25/2020 06/23/2020 06/08/2020 06/04/2020  Does Patient Have a Medical Advance Directive? No No No No No No No  Would patient like information on creating a medical advance directive? No - Patient declined No - Patient declined - No - Patient declined No - Patient declined No - Patient declined -    Current Medications (verified) Outpatient Encounter Medications as of 11/03/2020  Medication Sig  . alfuzosin (UROXATRAL) 10 MG 24 hr tablet Take 1 tablet (10 mg total) by mouth daily with breakfast.  . amitriptyline (ELAVIL) 10 MG tablet   . apixaban (ELIQUIS) 5 MG TABS tablet Take 1 tablet (5 mg total) by mouth 2 (two) times daily.  . Ascorbic Acid (VITAMIN C)  1000 MG tablet Take 1,000 mg by mouth daily.  . Cholecalciferol (VITAMIN D3) 125 MCG (5000 UT) CAPS Take 1 capsule by mouth daily.   . Cyanocobalamin (B-12 PO) Take 1 tablet by mouth daily.   . diazepam (VALIUM) 5 MG tablet Take 5 mg by mouth daily as needed for anxiety.   Eduard Roux (AIMOVIG) 70 MG/ML SOAJ Inject 1 mL into the skin every 30 (thirty) days.  Marland Kitchen gabapentin (NEURONTIN) 300 MG capsule Take 300 mg by mouth 3 (three) times daily.  . hydrochlorothiazide (HYDRODIURIL) 25 MG tablet Take 25 mg by mouth daily.  . metoprolol tartrate (LOPRESSOR) 25 MG tablet Take 1 tablet (25 mg total) by mouth 2 (two) times daily.  Marland Kitchen NEXLETOL 180 MG TABS Take 180 mg by mouth daily. Pt taking 2-3 times a week  . ondansetron (ZOFRAN ODT) 4 MG disintegrating tablet Take 1 tablet (4 mg total) by mouth every 8 (eight) hours as needed for nausea or vomiting.  . pantoprazole (PROTONIX) 40 MG tablet TAKE 1 TABLET BY MOUTH DAILY 30 MINUTES BEFORE BREAKFAST (Patient taking differently: Take 40 mg by mouth See admin instructions. Take 1 tablet by mouth daily 30 minutes before breakfast)  . polyethylene glycol powder (GLYCOLAX/MIRALAX) 17 GM/SCOOP powder Take 0.5 Containers by mouth daily.  . potassium chloride SA (KLOR-CON) 20 MEQ tablet Take 20 mEq by mouth 2 (two) times daily.  . Probiotic Product (PROBIOTIC-10 PO) Take 1 capsule by mouth daily.  . traMADol (ULTRAM) 50 MG tablet Take 50 mg by mouth every 6 (six) hours as needed.   Marland Kitchen  zinc gluconate 50 MG tablet Take 50 mg by mouth daily.   . diclofenac (CATAFLAM) 50 MG tablet Take 50 mg by mouth 2 (two) times daily. (Patient not taking: Reported on 11/03/2020)  . meclizine (ANTIVERT) 25 MG tablet meclizine 25 mg tablet  TAKE 1 TABLET BY MOUTH FOUR TIMES DAILY AS NEEDED FOR DIZZINESS (Patient not taking: Reported on 11/03/2020)  . mirabegron ER (MYRBETRIQ) 25 MG TB24 tablet Take 1 tablet (25 mg total) by mouth daily. (Patient not taking: Reported on 11/03/2020)  .  tadalafil (CIALIS) 20 MG tablet Take 1 tablet (20 mg total) by mouth daily as needed. (Patient not taking: Reported on 11/03/2020)  . Wheat Dextrin (BENEFIBER DRINK MIX PO) Take 1 oz by mouth daily.  (Patient not taking: Reported on 11/03/2020)   No facility-administered encounter medications on file as of 11/03/2020.    Allergies (verified) Latex   History: Past Medical History:  Diagnosis Date  . Arthritis   . Atrial fibrillation ([place])   . Atrial flutter ([place])   . BPH (benign prostatic hyperplasia)   . Chest pain   . Dizziness   . Dyspnea    laying down occ  . Fracture 08/17/2015   MULTIPLE RIB FRACTURES     FROM FALL   . GERD (gastroesophageal reflux disease)   . Hemorrhoids   . History of kidney stones    noted on CT scan  . History of radiation therapy 08/22/11-10/13/11   prostate  . Hyperlipemia   . Hypertension   . Hyperthyroidism   . Light headedness   . Numbness and tingling in left arm   . Numbness and tingling of both legs   . Open fracture of left elbow 08/18/2015  . Prostate cancer [facility])    prostate s/p radiation Mar 2013  . Rib fractures 08/17/2015   Past Surgical History:  Procedure Laterality Date  . BOTOX INJECTION N/A 01/14/2020   Procedure: INJECTION OF BOTOX INTO ANAL SPHINCTER;  Surgeon: Ileana Roup, MD;  Location: [facility];  Service: General;  Laterality: N/A;  . 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 Normal appearing colon  . COLONOSCOPY WITH PROPOFOL N/A 10/23/2019   Procedure: COLONOSCOPY WITH PROPOFOL;  Surgeon: Daneil Dolin, MD; external and grade 2 internal hemorrhoids, abnormal rectal blood vessels consistent with radiation proctitis s/p APC therapy, otherwise normal exam.  . EVALUATION UNDER ANESTHESIA WITH ANAL FISTULECTOMY N/A 01/14/2020   Procedure: ANORECTAL EXAM UNDER ANESTHESIA;  Surgeon: Ileana Roup, MD;  Location: [facility];   Service: General;  Laterality: N/A;  . HOT HEMOSTASIS  10/23/2019   Procedure: HOT HEMOSTASIS (ARGON PLASMA COAGULATION/BICAP);  Surgeon: Daneil Dolin, MD;  Location: [facility];  Service: Endoscopy;;  apc rectal proctitis    . KIDNEY SURGERY  1982   kidney tube collapse repair  . RADIOACTIVE SEED IMPLANT     Prostate   Family History  Problem Relation Age of Onset  . Aneurysm Mother   . Hypertension Mother   . Prostate cancer Father   . Hypertension Father   . Colon cancer Neg Hx   . Colon polyps Neg Hx    Social History   Socioeconomic History  . Marital status: Single    Spouse name: Not on file  . Number of children: Not on file  . Years of education: Not on file  . Highest education level: Not on file  Occupational History  .  Occupation: Custodian     Employer: [employer]  Tobacco Use  . Smoking status: Former Smoker    Packs/day: 1.00    Years: 20.00    Pack years: 20.00    Types: Cigarettes    Quit date: 2020    Years since quitting: 2.2  . Smokeless tobacco: Former Systems developer  . Tobacco comment: Quit smoking x 2 years    07/2015   SOMETIIMES i USE VAPOR   Vaping Use  . Vaping Use: Never used  Substance and Sexual Activity  . Alcohol use: Never  . Drug use: Never  . Sexual activity: Not Currently  Other Topics Concern  . Not on file  Social History Narrative   Divorced   Social Determinants of Health   Financial Resource Strain: Low Risk   . Difficulty of Paying Living Expenses: Not hard at all  Food Insecurity: No Food Insecurity  . Worried About Charity fundraiser in the Last Year: Never true  . Ran Out of Food in the Last Year: Never true  Transportation Needs: No Transportation Needs  . Lack of Transportation (Medical): No  . Lack of Transportation (Non-Medical): No  Physical Activity: Inactive  . Days of Exercise per Week: 0 days  . Minutes of Exercise per Session: 0 min  Stress: No Stress Concern Present  . Feeling of Stress :  Not at all  Social Connections: Socially Isolated  . Frequency of Communication with Friends and Family: Twice a week  . Frequency of Social Gatherings with Friends and Family: Once a week  . Attends Religious Services: Never  . Active Member of Clubs or Organizations: No  . Attends Archivist Meetings: Never  . Marital Status: Divorced    Tobacco Counseling Counseling given: Not Answered Comment: Quit smoking x 2 years    07/2015   SOMETIIMES i USE VAPOR    Clinical Intake:  Pre-visit preparation completed: Yes  Pain : No/denies pain     Nutritional Risks: None Diabetes: No  How often do you need to have someone help you when you read instructions, pamphlets, or other written materials from your doctor or pharmacy?: 1 - Never  Diabetic?No     Information entered by :: [place] of Daily Living In your present state of health, do you have any difficulty performing the following activities: 11/03/2020 01/07/2020  Hearing? N N  Vision? N N  Difficulty concentrating or making decisions? N N  Walking or climbing stairs? Y N  Comment legs easily fatigue -  Dressing or bathing? N N  Doing errands, shopping? N N  Preparing Food and eating ? N -  Using the Toilet? N -  In the past six months, have you accidently leaked urine? N -  Do you have problems with loss of bowel control? N -  Managing your Medications? N -  Managing your Finances? N -  Housekeeping or managing your Housekeeping? N -  Some recent data might be hidden    Patient Care Team: Lindell Spar, MD as PCP - General (Internal Medicine) Josue Hector, MD as PCP - Cardiology (Cardiology) Gala Romney Cristopher Estimable, MD as Attending Physician (Gastroenterology)  Indicate any recent Medical Services you may have received from other than [facility] providers in the past year (date may be approximate).     Assessment:   This is a routine wellness examination for [place].  Hearing/Vision screen   Hearing Screening   125Hz  250Hz  500Hz  1000Hz   2000Hz  3000Hz  4000Hz  6000Hz  8000Hz   Right ear:           Left ear:           Vision Screening Comments: Patient states gets eyes examined once per year. Wears glasses    Dietary issues and exercise activities discussed: Current Exercise Habits: The patient does not participate in regular exercise at present, Exercise limited by: cardiac condition(s)  Goals    . Exercise 3x per week (30 min per time)    . Patient Stated     I would like to feel better so that I can get back to doing things I used to do.      Depression Screen PHQ 2/9 Scores 11/03/2020 07/07/2020  PHQ - 2 Score 0 1  PHQ- 9 Score 0 6    Fall Risk Fall Risk  11/03/2020 07/07/2020  Falls in the past year? 0 0  Number falls in past yr: 0 0  Injury with Fall? 0 0  Risk for fall due to : History of fall(s) No Fall Risks  Follow up Falls evaluation completed;Falls prevention discussed Falls evaluation completed    FALL RISK PREVENTION PERTAINING TO THE HOME:  Any stairs in or around the home? No  If so, are there any without handrails? No  Home free of loose throw rugs in walkways, pet beds, electrical cords, etc? Yes  Adequate lighting in your home to reduce risk of falls? Yes   ASSISTIVE DEVICES UTILIZED TO PREVENT FALLS:  Life alert? No  Use of a cane, walker or w/c? No  Grab bars in the bathroom? Yes  Shower chair or bench in shower? No  Elevated toilet seat or a handicapped toilet? No     Cognitive Function:   Normal cognitive status assessed by direct observation by this Nurse Health Advisor. No abnormalities found.        Immunizations Immunization History  Administered Date(s) Administered  . Influenza,inj,quad, With Preservative 04/03/2019  . Influenza-Unspecified 05/26/2020  . Moderna Sars-Covid-2 Vaccination 09/07/2019, 10/12/2019    TDAP status: Due, Education has been provided regarding the importance of this vaccine. Advised may receive this  vaccine at local pharmacy or Health Dept. Aware to provide a copy of the vaccination record if obtained from local pharmacy or Health Dept. Verbalized acceptance and understanding.  Flu Vaccine status: Up to date  Pneumococcal vaccine status: Due, Education has been provided regarding the importance of this vaccine. Advised may receive this vaccine at local pharmacy or Health Dept. Aware to provide a copy of the vaccination record if obtained from local pharmacy or Health Dept. Verbalized acceptance and understanding.  Covid-19 vaccine status: Completed vaccines  Qualifies for Shingles Vaccine? Yes   Zostavax completed No   Shingrix Completed?: No.    Education has been provided regarding the importance of this vaccine. Patient has been advised to call insurance company to determine out of pocket expense if they have not yet received this vaccine. Advised may also receive vaccine at local pharmacy or Health Dept. Verbalized acceptance and understanding.  Screening Tests Health Maintenance  Topic Date Due  . Hepatitis C Screening  Never done  . TETANUS/TDAP  Never done  . PNA vac Low Risk Adult (1 of 2 - PCV13) Never done  . COVID-19 Vaccine (3 - Moderna risk 4-dose series) 11/09/2019  . INFLUENZA VACCINE  02/21/2021  . COLONOSCOPY (Pts 45-50yrs Insurance coverage will need to be confirmed)  10/22/2029  . HPV VACCINES  Aged Out  Health Maintenance  Health Maintenance Due  Topic Date Due  . Hepatitis C Screening  Never done  . TETANUS/TDAP  Never done  . PNA vac Low Risk Adult (1 of 2 - PCV13) Never done  . COVID-19 Vaccine (3 - Moderna risk 4-dose series) 11/09/2019    Colorectal cancer screening: Type of screening: Colonoscopy. Completed 10/23/2019. Repeat every 10 years  Lung Cancer Screening: (Low Dose CT Chest recommended if Age 31-80 years, 30 pack-year currently smoking OR have quit w/in 15years.) does not qualify.   Lung Cancer Screening Referral: N/A   Additional  Screening:  Hepatitis C Screening: does qualify;   Vision Screening: Recommended annual ophthalmology exams for early detection of glaucoma and other disorders of the eye. Is the patient up to date with their annual eye exam?  Yes  Who is the provider or what is the name of the office in which the patient attends annual eye exams? [facility] If pt is not established with a provider, would they like to be referred to a provider to establish care? No .   Dental Screening: Recommended annual dental exams for proper oral hygiene  Community Resource Referral / Chronic Care Management: CRR required this visit?  No   CCM required this visit?  No      Plan:     I have personally reviewed and noted the following in the patient's chart:   . Medical and social history . Use of alcohol, tobacco or illicit drugs  . Current medications and supplements . Functional ability and status . Nutritional status . Physical activity . Advanced directives . List of other physicians . Hospitalizations, surgeries, and ER visits in previous 12 months . Vitals . Screenings to include cognitive, depression, and falls . Referrals and appointments  In addition, I have reviewed and discussed with patient certain preventive protocols, quality metrics, and best practice recommendations. A written personalized care plan for preventive services as well as general preventive health recommendations were provided to patient.     Ofilia Neas, LPN   [id number]   Nurse Notes: None

## 2020-11-03 ENCOUNTER — Ambulatory Visit (INDEPENDENT_AMBULATORY_CARE_PROVIDER_SITE_OTHER): Payer: Medicare HMO

## 2020-11-03 DIAGNOSIS — Z0000 Encounter for general adult medical examination without abnormal findings: Secondary | ICD-10-CM

## 2020-11-03 NOTE — Patient Instructions (Signed)
Steven [name] , Thank you for taking time to come for your Medicare Wellness Visit. I appreciate your ongoing commitment to your health goals. Please review the following plan we discussed and let me know if I can assist you in the future.   Screening recommendations/referrals: Colonoscopy: Up to date, next due 10/22/2029 Recommended yearly ophthalmology/optometry visit for glaucoma screening and checkup Recommended yearly dental visit for hygiene and checkup  Vaccinations: Influenza vaccine: Up to date, next due fall 2022  Pneumococcal vaccine: Currently due, you may receive at your next office visit.  Tdap vaccine: Currently due, you may await and injury or receive at your local pharmacy or health department. Shingles vaccine: Currently due for Shingrix, if you would like to receive we recommend that you do so at your local pharmacy as it is less expensive.  Advanced directives: Advance directive discussed with you today. Even though you declined this today please call our office should you change your mind and we can give you the proper paperwork for you to fill out.   Conditions/risks identified: None   Next appointment: 11/09/2021 @ 10:20 am with [place] 65 Years and Older, Male Preventive care refers to lifestyle choices and visits with your health care provider that can promote health and wellness. What does preventive care include?  A yearly physical exam. This is also called an annual well check.  Dental exams once or twice a year.  Routine eye exams. Ask your health care provider how often you should have your eyes checked.  Personal lifestyle choices, including:  Daily care of your teeth and gums.  Regular physical activity.  Eating a healthy diet.  Avoiding tobacco and drug use.  Limiting alcohol use.  Practicing safe sex.  Taking low doses of aspirin every day.  Taking vitamin and mineral supplements as recommended by your health  care provider. What happens during an annual well check? The services and screenings done by your health care provider during your annual well check will depend on your age, overall health, lifestyle risk factors, and family history of disease. Counseling  Your health care provider may ask you questions about your:  Alcohol use.  Tobacco use.  Drug use.  Emotional well-being.  Home and relationship well-being.  Sexual activity.  Eating habits.  History of falls.  Memory and ability to understand (cognition).  Work and work Statistician. Screening  You may have the following tests or measurements:  Height, weight, and BMI.  Blood pressure.  Lipid and cholesterol levels. These may be checked every 5 years, or more frequently if you are over 81 years old.  Skin check.  Lung cancer screening. You may have this screening every year starting at age 2 if you have a 30-pack-year history of smoking and currently smoke or have quit within the past 15 years.  Fecal occult blood test (FOBT) of the stool. You may have this test every year starting at age 59.  Flexible sigmoidoscopy or colonoscopy. You may have a sigmoidoscopy every 5 years or a colonoscopy every 10 years starting at age 92.  Prostate cancer screening. Recommendations will vary depending on your family history and other risks.  Hepatitis C blood test.  Hepatitis B blood test.  Sexually transmitted disease (STD) testing.  Diabetes screening. This is done by checking your blood sugar (glucose) after you have not eaten for a while (fasting). You may have this done every 1-3 years.  Abdominal aortic aneurysm (AAA) screening. You may  need this if you are a current or former smoker.  Osteoporosis. You may be screened starting at age 33 if you are at high risk. Talk with your health care provider about your test results, treatment options, and if necessary, the need for more tests. Vaccines  Your health care  provider may recommend certain vaccines, such as:  Influenza vaccine. This is recommended every year.  Tetanus, diphtheria, and acellular pertussis (Tdap, Td) vaccine. You may need a Td booster every 10 years.  Zoster vaccine. You may need this after age 78.  Pneumococcal 13-valent conjugate (PCV13) vaccine. One dose is recommended after age 7.  Pneumococcal polysaccharide (PPSV23) vaccine. One dose is recommended after age 64. Talk to your health care provider about which screenings and vaccines you need and how often you need them. This information is not intended to replace advice given to you by your health care provider. Make sure you discuss any questions you have with your health care provider. Document Released: 08/06/2015 Document Revised: 03/29/2016 Document Reviewed: 05/11/2015 Elsevier Interactive Patient Education  2017 [place] Prevention in the Home Falls can cause injuries. They can happen to people of all ages. There are many things you can do to make your home safe and to help prevent falls. What can I do on the outside of my home?  Regularly fix the edges of walkways and driveways and fix any cracks.  Remove anything that might make you trip as you walk through a door, such as a raised step or threshold.  Trim any bushes or trees on the path to your home.  Use bright outdoor lighting.  Clear any walking paths of anything that might make someone trip, such as rocks or tools.  Regularly check to see if handrails are loose or broken. Make sure that both sides of any steps have handrails.  Any raised decks and porches should have guardrails on the edges.  Have any leaves, snow, or ice cleared regularly.  Use sand or salt on walking paths during winter.  Clean up any spills in your garage right away. This includes oil or grease spills. What can I do in the bathroom?  Use night lights.  Install grab bars by the toilet and in the tub and shower. Do  not use towel bars as grab bars.  Use non-skid mats or decals in the tub or shower.  If you need to sit down in the shower, use a plastic, non-slip stool.  Keep the floor dry. Clean up any water that spills on the floor as soon as it happens.  Remove soap buildup in the tub or shower regularly.  Attach bath mats securely with double-sided non-slip rug tape.  Do not have throw rugs and other things on the floor that can make you trip. What can I do in the bedroom?  Use night lights.  Make sure that you have a light by your bed that is easy to reach.  Do not use any sheets or blankets that are too big for your bed. They should not hang down onto the floor.  Have a firm chair that has side arms. You can use this for support while you get dressed.  Do not have throw rugs and other things on the floor that can make you trip. What can I do in the kitchen?  Clean up any spills right away.  Avoid walking on wet floors.  Keep items that you use a lot in easy-to-reach places.  If  you need to reach something above you, use a strong step stool that has a grab bar.  Keep electrical cords out of the way.  Do not use floor polish or wax that makes floors slippery. If you must use wax, use non-skid floor wax.  Do not have throw rugs and other things on the floor that can make you trip. What can I do with my stairs?  Do not leave any items on the stairs.  Make sure that there are handrails on both sides of the stairs and use them. Fix handrails that are broken or loose. Make sure that handrails are as long as the stairways.  Check any carpeting to make sure that it is firmly attached to the stairs. Fix any carpet that is loose or worn.  Avoid having throw rugs at the top or bottom of the stairs. If you do have throw rugs, attach them to the floor with carpet tape.  Make sure that you have a light switch at the top of the stairs and the bottom of the stairs. If you do not have them,  ask someone to add them for you. What else can I do to help prevent falls?  Wear shoes that:  Do not have high heels.  Have rubber bottoms.  Are comfortable and fit you well.  Are closed at the toe. Do not wear sandals.  If you use a stepladder:  Make sure that it is fully opened. Do not climb a closed stepladder.  Make sure that both sides of the stepladder are locked into place.  Ask someone to hold it for you, if possible.  Clearly mark and make sure that you can see:  Any grab bars or handrails.  First and last steps.  Where the edge of each step is.  Use tools that help you move around (mobility aids) if they are needed. These include:  Canes.  Walkers.  Scooters.  Crutches.  Turn on the lights when you go into a dark area. Replace any light bulbs as soon as they burn out.  Set up your furniture so you have a clear path. Avoid moving your furniture around.  If any of your floors are uneven, fix them.  If there are any pets around you, be aware of where they are.  Review your medicines with your doctor. Some medicines can make you feel dizzy. This can increase your chance of falling. Ask your doctor what other things that you can do to help prevent falls. This information is not intended to replace advice given to you by your health care provider. Make sure you discuss any questions you have with your health care provider. Document Released: 05/06/2009 Document Revised: 12/16/2015 Document Reviewed: 08/14/2014 Elsevier Interactive Patient Education  2017 [employer].

## 2020-11-04 DIAGNOSIS — E7849 Other hyperlipidemia: Secondary | ICD-10-CM | POA: Diagnosis not present

## 2020-11-04 DIAGNOSIS — Z1331 Encounter for screening for depression: Secondary | ICD-10-CM | POA: Diagnosis not present

## 2020-11-04 DIAGNOSIS — E6609 Other obesity due to excess calories: Secondary | ICD-10-CM | POA: Diagnosis not present

## 2020-11-04 DIAGNOSIS — Z6831 Body mass index (BMI) 31.0-31.9, adult: Secondary | ICD-10-CM | POA: Diagnosis not present

## 2020-11-04 DIAGNOSIS — I10 Essential (primary) hypertension: Secondary | ICD-10-CM | POA: Diagnosis not present

## 2020-11-09 ENCOUNTER — Ambulatory Visit (HOSPITAL_COMMUNITY): Payer: Medicare HMO | Attending: Surgery

## 2020-11-09 ENCOUNTER — Other Ambulatory Visit: Payer: Self-pay

## 2020-11-09 ENCOUNTER — Encounter (HOSPITAL_COMMUNITY): Payer: Self-pay

## 2020-11-09 DIAGNOSIS — M542 Cervicalgia: Secondary | ICD-10-CM | POA: Diagnosis not present

## 2020-11-09 DIAGNOSIS — R29898 Other symptoms and signs involving the musculoskeletal system: Secondary | ICD-10-CM | POA: Insufficient documentation

## 2020-11-09 DIAGNOSIS — M6281 Muscle weakness (generalized): Secondary | ICD-10-CM | POA: Insufficient documentation

## 2020-11-09 NOTE — Patient Instructions (Signed)
Access Code: [email or web address] URL: [email or web address] Date: 11/09/2020 Prepared by: Sherlyn Lees  Exercises Supine Chin Tuck - 1 x daily - 7 x weekly - 3 sets - 10 reps - 3 sec hold Supine Deep Neck Flexor Training - Repetitions - 1 x daily - 7 x weekly - 3 sets - 10 reps - 3 sec hold

## 2020-11-09 NOTE — Therapy (Signed)
[place], [place], [address] Phone: [phone]   Fax:  [phone]  Physical Therapy Evaluation  Patient Details  Name: Steven [name] MRN: [id number] Date of Birth: 1951-01-21 Referring Provider (PT): Duffy Rhody, MD   Encounter Date: 11/09/2020   PT End of Session - 11/09/20 1425    Visit Number 1    Number of Visits 6    Date for PT Re-Evaluation 12/21/20    Authorization Type [employer] Medicare HMO    Authorization Time Period auth req, [place]    PT Start Time 1352    PT Stop Time 1430    PT Time Calculation (min) 38 min    Activity Tolerance Patient tolerated treatment well    Behavior During Therapy [facility] for tasks assessed/performed           Past Medical History:  Diagnosis Date  . Arthritis   . Atrial fibrillation ([place])   . Atrial flutter ([place])   . BPH (benign prostatic hyperplasia)   . Chest pain   . Dizziness   . Dyspnea    laying down occ  . Fracture 08/17/2015   MULTIPLE RIB FRACTURES     FROM FALL   . GERD (gastroesophageal reflux disease)   . Hemorrhoids   . History of kidney stones    noted on CT scan  . History of radiation therapy 08/22/11-10/13/11   prostate  . Hyperlipemia   . Hypertension   . Hyperthyroidism   . Light headedness   . Numbness and tingling in left arm   . Numbness and tingling of both legs   . Open fracture of left elbow 08/18/2015  . Prostate cancer [facility])    prostate s/p radiation Mar 2013  . Rib fractures 08/17/2015    Past Surgical History:  Procedure Laterality Date  . BOTOX INJECTION N/A 01/14/2020   Procedure: INJECTION OF BOTOX INTO ANAL SPHINCTER;  Surgeon: Ileana Roup, MD;  Location: [facility] ORS;  Service: General;  Laterality: N/A;  . COLONOSCOPY N/A 12/19/2012   RFF:MBWGYK 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 Normal appearing colon  . COLONOSCOPY WITH PROPOFOL N/A 10/23/2019    Procedure: COLONOSCOPY WITH PROPOFOL;  Surgeon: Daneil Dolin, MD; external and grade 2 internal hemorrhoids, abnormal rectal blood vessels consistent with radiation proctitis s/p APC therapy, otherwise normal exam.  . EVALUATION UNDER ANESTHESIA WITH ANAL FISTULECTOMY N/A 01/14/2020   Procedure: ANORECTAL EXAM UNDER ANESTHESIA;  Surgeon: Ileana Roup, MD;  Location: [facility] ORS;  Service: General;  Laterality: N/A;  . HOT HEMOSTASIS  10/23/2019   Procedure: HOT HEMOSTASIS (ARGON PLASMA COAGULATION/BICAP);  Surgeon: Daneil Dolin, MD;  Location: [facility];  Service: Endoscopy;;  apc rectal proctitis    . KIDNEY SURGERY  1982   kidney tube collapse repair  . RADIOACTIVE SEED IMPLANT     Prostate    There were no vitals filed for this visit.    Subjective Assessment - 11/09/20 1357    Subjective Pt reports neck pain of 1-2 years and reports feeling electricity in his LUE. Pt notes recent neurologist appointment and NCV which he reports revealed "damaged nerves but not sure from where." Pt notes neck pain and feeling cracking and popping in his neck and left > right side neck pain and stiffness    Pertinent History prostate cancer; irritable bowel syndrome; carpal tunnel; 2017 fx radius LT  [facility] PT Assessment - 11/09/20 0001      Assessment   Medical Diagnosis Cervical spondylosis w/o myelopathy    Referring Provider (PT) Duffy Rhody, MD      Observation/Other Assessments   Observations slouched in seated    Focus on Therapeutic Outcomes (FOTO)  not completed today secondary to time constraints      Sensation   Light Touch Appears Intact      Posture/Postural Control   Posture/Postural Control Postural limitations    Postural Limitations Rounded Shoulders;Forward head      AROM   Cervical Flexion 0% limited    Cervical Extension 25% limited    Cervical - Right Side Bend 25% limited    Cervical - Left Side Bend 25% limited    Cervical - Right  Rotation 10% limited    Cervical - Left Rotation 10% limited                      Objective measurements completed on examination: See above findings.       [facility] Adult PT Treatment/Exercise - 11/09/20 0001      Neck Exercises: Supine   Neck Retraction 10 reps;3 secs    Capital Flexion 10 reps;3 secs                  PT Education - 11/09/20 1422    Education Details pt education on PT tx rationale and benefits of therapeutic intervention and trials of traction    Person(s) Educated Patient    Methods Explanation    Comprehension Verbalized understanding            PT Short Term Goals - 11/09/20 1512      PT SHORT TERM GOAL #1   Title Patient will be independent with HEP in order to improve functional outcomes.    Time 3    Period Weeks    Status New    Target Date 11/30/20      PT SHORT TERM GOAL #2   Title Patient will report at least 25% improvement in symptoms for improved quality of life.    Time 3    Period Weeks    Status New    Target Date 11/30/20             PT Long Term Goals - 11/09/20 1513      PT LONG TERM GOAL #1   Title Patient will report at least 50% improvement in overall symptoms and function to demonstrate overall improved functional ability    Time 6    Period Weeks    Status New    Target Date 12/21/20      PT LONG TERM GOAL #2   Title Patient will improve on FOTO score to meet predicted outcomes to improve functional independence    Baseline will perform at next session    Time 6    Period Weeks    Status New    Target Date 12/21/20                  Plan - 11/09/20 1426    Clinical Impression Statement Pt is 70 yo male with report of chronic neck pain and experiencing LUE pain/symptoms along dorsum of hand and lateral arm. Pt demonstrates cervical spine ROM restrictions, pain, and generalized weakness coupled with poor postural alignment. Pt would benefit from PT services to decrease neck pain and  improve postural awareness/alignment to decrease stress and pain to centralize pain  response and improve activity tolerance    Personal Factors and Comorbidities Behavior Pattern;Social Background;Time since onset of injury/illness/exacerbation;Transportation;Fitness;Past/Current Experience    Examination-Activity Limitations Stand;Locomotion Level;Lift    Examination-Participation Restrictions Cleaning;Community Activity;Yard Work    Stability/Clinical Decision Making Stable/Uncomplicated    Clinical Decision Making Low    Rehab Potential Good    PT Frequency 1x / week    PT Duration 6 weeks    PT Treatment/Interventions ADLs/Self Care Home Management;Balance training;Patient/family education;Electrical Stimulation;Traction;Ultrasound;Moist Heat;Neuromuscular re-education;Manual techniques;Passive range of motion;Vestibular;Taping;Energy conservation;Dry needling;Spinal Manipulations;Joint Manipulations    PT Next Visit Plan mechanical traction, FOTO    PT Home Exercise Plan supine neck retraction, capital flexion    Consulted and Agree with Plan of Care Patient           Patient will benefit from skilled therapeutic intervention in order to improve the following deficits and impairments:  Decreased activity tolerance,Decreased strength,Decreased range of motion,Hypomobility,Postural dysfunction,Pain  Visit Diagnosis: Neck pain  Other symptoms and signs involving the musculoskeletal system  Muscle weakness (generalized)     Problem List Patient Active Problem List   Diagnosis Date Noted  . Chronic pain 07/07/2020  . Encounter to establish care 07/07/2020  . Anxiety 07/07/2020  . Carcinoma of prostate ([place]) 02/17/2020  . Subacute thyroiditis 01/07/2020  . Personal history of malignant neoplasm of prostate 12/12/2019  . Benign prostatic hyperplasia with urinary obstruction 12/12/2019  . Weak urinary stream 12/12/2019  . Hyperthyroidism 11/24/2019  . Weight loss 10/21/2019  .  IBS (irritable bowel syndrome) 10/21/2019  . Dizzy spells 09/20/2019  . Intractable chronic migraine without aura 09/20/2019  . Obstructive sleep apnea syndrome 09/20/2019  . Psychophysiologic insomnia 09/20/2019  . Rectal pain 08/21/2019  . Nausea without vomiting 05/30/2019  . Atrial flutter ([place]) 04/17/2019  . Chronic atrial fibrillation ([place]) 03/02/2019  . HTN (hypertension) 03/02/2019  . Constipation 08/02/2017  . Hemorrhoids 08/02/2017  . Fall 08/18/2015  . Radiation proctitis 01/30/2013  . Rectal bleeding 12/10/2012  . Obese 05/30/2012  . Malignant neoplasm of prostate ([place]) 08/13/2011   3:16 PM, 11/09/20 M. Sherlyn Lees, PT, DPT Physical Therapist- [place] Office Number: [phone]  [place] [address] [place], [place], [address] Phone: [phone]   Fax:  [phone]  Name: WALTHER SANAGUSTIN MRN: [id number] Date of Birth: 10-16-1950

## 2020-11-11 DIAGNOSIS — I10 Essential (primary) hypertension: Secondary | ICD-10-CM | POA: Diagnosis not present

## 2020-11-11 DIAGNOSIS — Z1389 Encounter for screening for other disorder: Secondary | ICD-10-CM | POA: Diagnosis not present

## 2020-11-11 DIAGNOSIS — E7849 Other hyperlipidemia: Secondary | ICD-10-CM | POA: Diagnosis not present

## 2020-11-11 DIAGNOSIS — Z6831 Body mass index (BMI) 31.0-31.9, adult: Secondary | ICD-10-CM | POA: Diagnosis not present

## 2020-11-16 ENCOUNTER — Telehealth: Payer: Self-pay

## 2020-11-17 ENCOUNTER — Other Ambulatory Visit: Payer: Self-pay

## 2020-11-17 ENCOUNTER — Encounter (HOSPITAL_COMMUNITY): Payer: Self-pay

## 2020-11-17 ENCOUNTER — Ambulatory Visit (HOSPITAL_COMMUNITY): Payer: Medicare HMO

## 2020-11-17 DIAGNOSIS — R29898 Other symptoms and signs involving the musculoskeletal system: Secondary | ICD-10-CM

## 2020-11-17 DIAGNOSIS — M542 Cervicalgia: Secondary | ICD-10-CM | POA: Diagnosis not present

## 2020-11-17 DIAGNOSIS — M6281 Muscle weakness (generalized): Secondary | ICD-10-CM

## 2020-11-17 NOTE — Therapy (Signed)
[place], [place], [address] Phone: [phone]   Fax:  [phone]  Physical Therapy Treatment  Patient Details  Name: Steven [name] MRN: [id number] Date of Birth: 02-10-1951 Referring Provider (PT): Duffy Rhody, MD   Encounter Date: 11/17/2020   PT End of Session - 11/17/20 1442    Visit Number 2    Number of Visits 6    Date for PT Re-Evaluation 12/21/20    Authorization Type [employer] H[employer]    Authorization Time Period Roxanna Mew, [place]    PT Start Time 1432    PT Stop Time 1515    PT Time Calculation (min) 43 min    Activity Tolerance Patient tolerated treatment well    Behavior During Therapy [facility] for tasks assessed/performed           Past Medical History:  Diagnosis Date  . Arthritis   . Atrial fibrillation ([place])   . Atrial flutter ([place])   . BPH (benign prostatic hyperplasia)   . Chest pain   . Dizziness   . Dyspnea    laying down occ  . Fracture 08/17/2015   MULTIPLE RIB FRACTURES     FROM FALL   . GERD (gastroesophageal reflux disease)   . Hemorrhoids   . History of kidney stones    noted on CT scan  . History of radiation therapy 08/22/11-10/13/11   prostate  . Hyperlipemia   . Hypertension   . Hyperthyroidism   . Light headedness   . Numbness and tingling in left arm   . Numbness and tingling of both legs   . Open fracture of left elbow 08/18/2015  . Prostate cancer [facility])    prostate s/p radiation Mar 2013  . Rib fractures 08/17/2015    Past Surgical History:  Procedure Laterality Date  . BOTOX INJECTION N/A 01/14/2020   Procedure: INJECTION OF BOTOX INTO ANAL SPHINCTER;  Surgeon: Ileana Roup, MD;  Location: [facility];  Service: General;  Laterality: N/A;  . COLONOSCOPY N/A 12/19/2012   [id number] 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 Normal appearing colon  . COLONOSCOPY WITH PROPOFOL N/A 10/23/2019    Procedure: COLONOSCOPY WITH PROPOFOL;  Surgeon: Daneil Dolin, MD; external and grade 2 internal hemorrhoids, abnormal rectal blood vessels consistent with radiation proctitis s/p APC therapy, otherwise normal exam.  . EVALUATION UNDER ANESTHESIA WITH ANAL FISTULECTOMY N/A 01/14/2020   Procedure: ANORECTAL EXAM UNDER ANESTHESIA;  Surgeon: Ileana Roup, MD;  Location: [facility];  Service: General;  Laterality: N/A;  . HOT HEMOSTASIS  10/23/2019   Procedure: HOT HEMOSTASIS (ARGON PLASMA COAGULATION/BICAP);  Surgeon: Daneil Dolin, MD;  [facility];  Service: Endoscopy;;  apc rectal proctitis    . KIDNEY SURGERY  1982   kidney tube collapse repair  . RADIOACTIVE SEED IMPLANT     Prostate    There were no vitals filed for this visit.   Subjective Assessment - 11/17/20 1440    Subjective Pt reports some improvement in his neck pain as of late. Pt reports non-compliance with initial HEP    Pertinent History prostate cancer; irritable bowel syndrome; carpal tunnel; 2017 fx radius LT    Currently in Pain? Yes    Pain Score 2     Pain Location Neck    Pain Orientation Left    Pain Type Chronic pain  [facility] PT Assessment - 11/17/20 0001      Assessment   Medical Diagnosis Cervical spondylosis w/o myelopathy    Referring Provider (PT) Duffy Rhody, MD                         [facility] Adult PT Treatment/Exercise - 11/17/20 0001      Neck Exercises: Seated   Neck Retraction 5 secs;10 reps    Postural Training seated external rotation with red t-band 3x10    Other Seated Exercise extension SNAG with towel x 10 reps 3 sec hold. Rotation SNAG 10x 3 sec      Neck Exercises: Supine   Neck Retraction 10 reps;3 secs    Capital Flexion 10 reps;3 secs                    PT Short Term Goals - 11/09/20 1512      PT SHORT TERM GOAL #1   Title Patient will be independent with HEP in order to improve functional outcomes.    Time 3     Period Weeks    Status New    Target Date 11/30/20      PT SHORT TERM GOAL #2   Title Patient will report at least 25% improvement in symptoms for improved quality of life.    Time 3    Period Weeks    Status New    Target Date 11/30/20             PT Long Term Goals - 11/09/20 1513      PT LONG TERM GOAL #1   Title Patient will report at least 50% improvement in overall symptoms and function to demonstrate overall improved functional ability    Time 6    Period Weeks    Status New    Target Date 12/21/20      PT LONG TERM GOAL #2   Title Patient will improve on FOTO score to meet predicted outcomes to improve functional independence    Baseline will perform at next session    Time 6    Period Weeks    Status New    Target Date 12/21/20                 Plan - 11/17/20 1534    Clinical Impression Statement Tolerated tx session well. Continues to demonstrate poor posture with pronounced forward head and rounded shoulders requiring frequent cues for correction. Continued POC indicated to progress POC details and improve compliance with HEP and postural correction to reduce neck pain    Personal Factors and Comorbidities Behavior Pattern;Social Background;Time since onset of injury/illness/exacerbation;Transportation;Fitness;Past/Current Experience    Examination-Activity Limitations Stand;Locomotion Level;Lift    Examination-Participation Restrictions Cleaning;Community Activity;Yard Work    Stability/Clinical Decision Making Stable/Uncomplicated    Rehab Potential Good    PT Frequency 1x / week    PT Duration 6 weeks    PT Treatment/Interventions ADLs/Self Care Home Management;Balance training;Patient/family education;Electrical Stimulation;Traction;Ultrasound;Moist Heat;Neuromuscular re-education;Manual techniques;Passive range of motion;Vestibular;Taping;Energy conservation;Dry needling;Spinal Manipulations;Joint Manipulations    PT Next Visit Plan mechanical  traction, FOTO    PT Home Exercise Plan supine neck retraction, capital flexion. Extension and rotation SNAG, ER with red t-band    Consulted and Agree with Plan of Care Patient           Patient will benefit from skilled therapeutic intervention in order to improve the following deficits and impairments:  Decreased activity tolerance,Decreased strength,Decreased range  of motion,Hypomobility,Postural dysfunction,Pain  Visit Diagnosis: Neck pain  Other symptoms and signs involving the musculoskeletal system  Muscle weakness (generalized)     Problem List Patient Active Problem List   Diagnosis Date Noted  . Chronic pain 07/07/2020  . Encounter to establish care 07/07/2020  . Anxiety 07/07/2020  . Carcinoma of prostate ([place]) 02/17/2020  . Subacute thyroiditis 01/07/2020  . Personal history of malignant neoplasm of prostate 12/12/2019  . Benign prostatic hyperplasia with urinary obstruction 12/12/2019  . Weak urinary stream 12/12/2019  . Hyperthyroidism 11/24/2019  . Weight loss 10/21/2019  . IBS (irritable bowel syndrome) 10/21/2019  . Dizzy spells 09/20/2019  . Intractable chronic migraine without aura 09/20/2019  . Obstructive sleep apnea syndrome 09/20/2019  . Psychophysiologic insomnia 09/20/2019  . Rectal pain 08/21/2019  . Nausea without vomiting 05/30/2019  . Atrial flutter ([place]) 04/17/2019  . Chronic atrial fibrillation ([place]) 03/02/2019  . HTN (hypertension) 03/02/2019  . Constipation 08/02/2017  . Hemorrhoids 08/02/2017  . Fall 08/18/2015  . Radiation proctitis 01/30/2013  . Rectal bleeding 12/10/2012  . Obese 05/30/2012  . Malignant neoplasm of prostate ([place]) 08/13/2011   3:38 PM, 11/17/20 M. Sherlyn Lees, PT, DPT Physical Therapist- [place] Office Number: [phone]  [facility] [address] [place], [place], [address] Phone: [phone]   Fax:  [phone]  Name: ADEEL GUIFFRE MRN:  [id number] Date of Birth: 12-Jun-1951

## 2020-11-17 NOTE — Patient Instructions (Signed)
Access Code: [email or web address] URL: [email or web address] Date: 11/17/2020 Prepared by: Sherlyn Lees  Exercises Mid-Lower Cervical Extension SNAG with Strap - 1 x daily - 7 x weekly - 3 sets - 10 reps Seated Assisted Cervical Rotation with Towel - 1 x daily - 7 x weekly - 3 sets - 10 reps Shoulder External Rotation and Scapular Retraction with Resistance - 1 x daily - 7 x weekly - 3 sets - 10 reps

## 2020-11-23 ENCOUNTER — Other Ambulatory Visit: Payer: Self-pay

## 2020-11-23 ENCOUNTER — Encounter (HOSPITAL_COMMUNITY): Payer: Self-pay

## 2020-11-23 ENCOUNTER — Ambulatory Visit (HOSPITAL_COMMUNITY): Payer: Medicare HMO | Attending: Surgery

## 2020-11-23 DIAGNOSIS — R29898 Other symptoms and signs involving the musculoskeletal system: Secondary | ICD-10-CM | POA: Diagnosis not present

## 2020-11-23 DIAGNOSIS — M6281 Muscle weakness (generalized): Secondary | ICD-10-CM | POA: Diagnosis not present

## 2020-11-23 DIAGNOSIS — M542 Cervicalgia: Secondary | ICD-10-CM | POA: Diagnosis not present

## 2020-11-23 NOTE — Therapy (Signed)
[place], [place], [address] Phone: [phone]   Fax:  [phone]  Physical Therapy Treatment  Patient Details  Name: Steven [name] MRN: [id number] Date of Birth: 1951/01/12 Referring Provider (PT): Duffy Rhody, MD   Encounter Date: 11/23/2020   PT End of Session - 11/23/20 1449    Visit Number 3    Number of Visits 6    Date for PT Re-Evaluation 12/21/20    Authorization Type [employer] Medicare HMO    Authorization Time Period auth req, [place]    PT Start Time 1435    PT Stop Time 1520    PT Time Calculation (min) 45 min    Activity Tolerance Patient tolerated treatment well    Behavior During Therapy [facility] for tasks assessed/performed           Past Medical History:  Diagnosis Date  . Arthritis   . Atrial fibrillation ([place])   . Atrial flutter ([place])   . BPH (benign prostatic hyperplasia)   . Chest pain   . Dizziness   . Dyspnea    laying down occ  . Fracture 08/17/2015   MULTIPLE RIB FRACTURES     FROM FALL   . GERD (gastroesophageal reflux disease)   . Hemorrhoids   . History of kidney stones    noted on CT scan  . History of radiation therapy 08/22/11-10/13/11   prostate  . Hyperlipemia   . Hypertension   . Hyperthyroidism   . Light headedness   . Numbness and tingling in left arm   . Numbness and tingling of both legs   . Open fracture of left elbow 08/18/2015  . Prostate cancer [facility])    prostate s/p radiation Mar 2013  . Rib fractures 08/17/2015    Past Surgical History:  Procedure Laterality Date  . BOTOX INJECTION N/A 01/14/2020   Procedure: INJECTION OF BOTOX INTO ANAL SPHINCTER;  Surgeon: Ileana Roup, MD;  [facility];  Service: General;  Laterality: N/A;  . COLONOSCOPY N/A 12/19/2012   GYI:[id number] 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 Normal appearing colon  . COLONOSCOPY WITH PROPOFOL N/A 10/23/2019    Procedure: COLONOSCOPY WITH PROPOFOL;  Surgeon: Daneil Dolin, MD; external and grade 2 internal hemorrhoids, abnormal rectal blood vessels consistent with radiation proctitis s/p APC therapy, otherwise normal exam.  . EVALUATION UNDER ANESTHESIA WITH ANAL FISTULECTOMY N/A 01/14/2020   Procedure: ANORECTAL EXAM UNDER ANESTHESIA;  Surgeon: Ileana Roup, MD;  [facility];  Service: General;  Laterality: N/A;  . HOT HEMOSTASIS  10/23/2019   Procedure: HOT HEMOSTASIS (ARGON PLASMA COAGULATION/BICAP);  Surgeon: Daneil Dolin, MD;  [facility];  Service: Endoscopy;;  apc rectal proctitis    . KIDNEY SURGERY  1982   kidney tube collapse repair  . RADIOACTIVE SEED IMPLANT     Prostate    There were no vitals filed for this visit.   Subjective Assessment - 11/23/20 1448    Subjective Pt reports he has been performing chin retractions and this has been helping    Pertinent History prostate cancer; irritable bowel syndrome; carpal tunnel; 2017 fx radius LT    Currently in Pain? Yes    Pain Score 2     Pain Location Neck    Pain Orientation Left    Pain Descriptors / Indicators Tingling    Pain Type Chronic pain  [facility] PT Assessment - 11/23/20 0001      Assessment   Medical Diagnosis Cervical spondylosis w/o myelopathy    Referring Provider (PT) Duffy Rhody, MD                         [facility] Adult PT Treatment/Exercise - 11/23/20 0001      Exercises   Exercises Shoulder      Neck Exercises: Theraband   Scapula Retraction 20 reps    Rows 20 reps    Rows Limitations 4 plates with tower      Neck Exercises: Supine   Capital Flexion 15 reps;3 secs      Neck Exercises: Prone   Axial Exension 10 reps                  PT Education - 11/23/20 1458    Education Details education in new HEP additions and additional postural corrective movements to reduce forward head and rounded shoulders    Person(s) Educated Patient     Methods Explanation;Demonstration;Handout    Comprehension Verbalized understanding;Returned demonstration            PT Short Term Goals - 11/09/20 1512      PT SHORT TERM GOAL #1   Title Patient will be independent with HEP in order to improve functional outcomes.    Time 3    Period Weeks    Status New    Target Date 11/30/20      PT SHORT TERM GOAL #2   Title Patient will report at least 25% improvement in symptoms for improved quality of life.    Time 3    Period Weeks    Status New    Target Date 11/30/20             PT Long Term Goals - 11/09/20 1513      PT LONG TERM GOAL #1   Title Patient will report at least 50% improvement in overall symptoms and function to demonstrate overall improved functional ability    Time 6    Period Weeks    Status New    Target Date 12/21/20      PT LONG TERM GOAL #2   Title Patient will improve on FOTO score to meet predicted outcomes to improve functional independence    Baseline will perform at next session    Time 6    Period Weeks    Status New    Target Date 12/21/20                 Plan - 11/23/20 1511    Clinical Impression Statement Tolerating tx sessions well and demo good recitation of chin retractions without need for cues.  Progressing resisted exercises for neck and shoulders without incident and tolerating increased ressistance for UE strengthening and against gravity positions for cervical strengthening. COntinued POC indicated to progress scapular strength and postural re-education    Personal Factors and Comorbidities Behavior Pattern;Social Background;Time since onset of injury/illness/exacerbation;Transportation;Fitness;Past/Current Experience    Examination-Activity Limitations Stand;Locomotion Level;Lift    Examination-Participation Restrictions Cleaning;Community Activity;Yard Work    Stability/Clinical Decision Making Stable/Uncomplicated    Rehab Potential Good    PT Frequency 1x / week     PT Duration 6 weeks    PT Treatment/Interventions ADLs/Self Care Home Management;Balance training;Patient/family education;Electrical Stimulation;Traction;Ultrasound;Moist Heat;Neuromuscular re-education;Manual techniques;Passive range of motion;Vestibular;Taping;Energy conservation;Dry needling;Spinal Manipulations;Joint Manipulations    PT Next Visit Plan progress strengthening for c-spine and shoulders  PT Home Exercise Plan supine neck retraction, capital flexion. Extension and rotation SNAG, ER with red t-band. prone cervical retraction/extension, scapula retraction    Consulted and Agree with Plan of Care Patient           Patient will benefit from skilled therapeutic intervention in order to improve the following deficits and impairments:  Decreased activity tolerance,Decreased strength,Decreased range of motion,Hypomobility,Postural dysfunction,Pain  Visit Diagnosis: Neck pain  Other symptoms and signs involving the musculoskeletal system  Muscle weakness (generalized)     Problem List Patient Active Problem List   Diagnosis Date Noted  . Chronic pain 07/07/2020  . Encounter to establish care 07/07/2020  . Anxiety 07/07/2020  . Carcinoma of prostate ([place]) 02/17/2020  . Subacute thyroiditis 01/07/2020  . Personal history of malignant neoplasm of prostate 12/12/2019  . Benign prostatic hyperplasia with urinary obstruction 12/12/2019  . Weak urinary stream 12/12/2019  . Hyperthyroidism 11/24/2019  . Weight loss 10/21/2019  . IBS (irritable bowel syndrome) 10/21/2019  . Dizzy spells 09/20/2019  . Intractable chronic migraine without aura 09/20/2019  . Obstructive sleep apnea syndrome 09/20/2019  . Psychophysiologic insomnia 09/20/2019  . Rectal pain 08/21/2019  . Nausea without vomiting 05/30/2019  . Atrial flutter ([place]) 04/17/2019  . Chronic atrial fibrillation ([place]) 03/02/2019  . HTN (hypertension) 03/02/2019  . Constipation 08/02/2017  . Hemorrhoids 08/02/2017   . Fall 08/18/2015  . Radiation proctitis 01/30/2013  . Rectal bleeding 12/10/2012  . Obese 05/30/2012  . Malignant neoplasm of prostate ([place]) 08/13/2011   3:23 PM, 11/23/20 M. Sherlyn Lees, PT, DPT Physical Therapist- [place] Office Number: [phone]  [facility] [address] [place], [place], [address] Phone: [phone]   Fax:  [phone]  Name: ARGIE APPLEGATE MRN: [id number] Date of Birth: [birth year]

## 2020-11-23 NOTE — Patient Instructions (Signed)
Access Code: [email or web address] URL: [email or web address] Date: 11/23/2020 Prepared by: Sherlyn Lees  Exercises Prone Cervical Retraction - 1 x daily - 7 x weekly - 3 sets - 10 reps Horizontal Shoulder Abduction and Adduction with Coordinated Breathing - 1 x daily - 7 x weekly - 3 sets - 10 reps - 3 hold Standing Shoulder Horizontal Abduction with Resistance - 1 x daily - 7 x weekly - 3 sets - 10 reps

## 2020-11-29 DIAGNOSIS — K6289 Other specified diseases of anus and rectum: Secondary | ICD-10-CM | POA: Diagnosis not present

## 2020-11-29 DIAGNOSIS — K5900 Constipation, unspecified: Secondary | ICD-10-CM | POA: Diagnosis not present

## 2020-11-29 DIAGNOSIS — L290 Pruritus ani: Secondary | ICD-10-CM | POA: Diagnosis not present

## 2020-11-30 ENCOUNTER — Ambulatory Visit (HOSPITAL_COMMUNITY): Payer: Medicare HMO

## 2020-12-01 DIAGNOSIS — G43711 Chronic migraine without aura, intractable, with status migrainosus: Secondary | ICD-10-CM | POA: Diagnosis not present

## 2020-12-07 ENCOUNTER — Ambulatory Visit (HOSPITAL_COMMUNITY): Payer: Medicare HMO

## 2020-12-07 ENCOUNTER — Other Ambulatory Visit: Payer: Self-pay

## 2020-12-07 DIAGNOSIS — R29898 Other symptoms and signs involving the musculoskeletal system: Secondary | ICD-10-CM

## 2020-12-07 DIAGNOSIS — M6281 Muscle weakness (generalized): Secondary | ICD-10-CM | POA: Diagnosis not present

## 2020-12-07 DIAGNOSIS — M542 Cervicalgia: Secondary | ICD-10-CM | POA: Diagnosis not present

## 2020-12-07 NOTE — Therapy (Signed)
[place], [place], [address] Phone: [phone]   Fax:  [phone]  Physical Therapy Treatment  Patient Details  Name: Steven [name] MRN: [id number] Date of Birth: 03/24/1951 Referring Provider (PT): Duffy Rhody, MD   Encounter Date: 12/07/2020   PT End of Session - 12/07/20 1344    Visit Number 4    Number of Visits 6    Date for PT Re-Evaluation 12/21/20    Authorization Type [employer] H[employer]    Authorization Time Period auth req, [place]    PT Start Time 1345    PT Stop Time 1430    PT Time Calculation (min) 45 min    Activity Tolerance Patient tolerated treatment well    Behavior During Therapy [facility] for tasks assessed/performed           Past Medical History:  Diagnosis Date  . Arthritis   . Atrial fibrillation ([place])   . Atrial flutter ([place])   . BPH (benign prostatic hyperplasia)   . Chest pain   . Dizziness   . Dyspnea    laying down occ  . Fracture 08/17/2015   MULTIPLE RIB FRACTURES     FROM FALL   . GERD (gastroesophageal reflux disease)   . Hemorrhoids   . History of kidney stones    noted on CT scan  . History of radiation therapy 08/22/11-10/13/11   prostate  . Hyperlipemia   . Hypertension   . Hyperthyroidism   . Light headedness   . Numbness and tingling in left arm   . Numbness and tingling of both legs   . Open fracture of left elbow 08/18/2015  . Prostate cancer [facility])    prostate s/p radiation Mar 2013  . Rib fractures 08/17/2015    Past Surgical History:  Procedure Laterality Date  . BOTOX INJECTION N/A 01/14/2020   Procedure: INJECTION OF BOTOX INTO ANAL SPHINCTER;  Surgeon: Ileana Roup, MD;  Location: [facility];  Service: General;  Laterality: N/A;  . COLONOSCOPY N/A 12/19/2012   [id number] 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 Normal appearing colon  . COLONOSCOPY WITH PROPOFOL N/A 10/23/2019    Procedure: COLONOSCOPY WITH PROPOFOL;  Surgeon: Daneil Dolin, MD; external and grade 2 internal hemorrhoids, abnormal rectal blood vessels consistent with radiation proctitis s/p APC therapy, otherwise normal exam.  . EVALUATION UNDER ANESTHESIA WITH ANAL FISTULECTOMY N/A 01/14/2020   Procedure: ANORECTAL EXAM UNDER ANESTHESIA;  Surgeon: Ileana Roup, MD;  Location: [facility];  Service: General;  Laterality: N/A;  . HOT HEMOSTASIS  10/23/2019   Procedure: HOT HEMOSTASIS (ARGON PLASMA COAGULATION/BICAP);  Surgeon: Daneil Dolin, MD;  [facility];  Service: Endoscopy;;  apc rectal proctitis    . KIDNEY SURGERY  1982   kidney tube collapse repair  . RADIOACTIVE SEED IMPLANT     Prostate    There were no vitals filed for this visit.   Subjective Assessment - 12/07/20 1347    Subjective Pt reports posterior neck pain and some instances of pain referred to LUE > RUE    Pertinent History prostate cancer; irritable bowel syndrome; carpal tunnel; 2017 fx radius LT    Currently in Pain? Yes    Pain Score 4     Pain Location Neck    Pain Orientation Left    Pain Descriptors / Indicators Pins and needles;Stabbing    Pain Type Chronic pain  [facility] Adult PT Treatment/Exercise - 12/07/20 0001      Neck Exercises: Standing   Neck Retraction 10 reps;3 secs    Wall Push Ups 20 reps    Wall Wash bilateral scaption 2x10 standing against wall. 2 lbs    Other Standing Exercises shoulder extension with blue + chin retraction 3x10    Other Standing Exercises unilateral row 2x10, 4 plates left/right      Neck Exercises: Seated   Cervical Rotation Both;10 reps    Lateral Flexion Both;10 reps    Shoulder Rolls Backwards;Forwards;10 reps    Other Seated Exercise --                  PT Education - 12/07/20 1356    Education Details education in benefits of continued postural awareness/corrective exercises to reduce forward head     Person(s) Educated Patient    Methods Explanation;Demonstration    Comprehension Verbalized understanding;Verbal cues required            PT Short Term Goals - 11/09/20 1512      PT SHORT TERM GOAL #1   Title Patient will be independent with HEP in order to improve functional outcomes.    Time 3    Period Weeks    Status New    Target Date 11/30/20      PT SHORT TERM GOAL #2   Title Patient will report at least 25% improvement in symptoms for improved quality of life.    Time 3    Period Weeks    Status New    Target Date 11/30/20             PT Long Term Goals - 11/09/20 1513      PT LONG TERM GOAL #1   Title Patient will report at least 50% improvement in overall symptoms and function to demonstrate overall improved functional ability    Time 6    Period Weeks    Status New    Target Date 12/21/20      PT LONG TERM GOAL #2   Title Patient will improve on FOTO score to meet predicted outcomes to improve functional independence    Baseline will perform at next session    Time 6    Period Weeks    Status New    Target Date 12/21/20                 Plan - 12/07/20 1408    Clinical Impression Statement Tolerating increased strengthening exercises well without adverse effects.  Continues to require cues for chin retraction and urpight/neutral head posture during exercises and postural awareness.  Continued POC indicated to progress POC and HEP development to improve carryover of postural re-education    Personal Factors and Comorbidities Behavior Pattern;Social Background;Time since onset of injury/illness/exacerbation;Transportation;Fitness;Past/Current Experience    Examination-Activity Limitations Stand;Locomotion Level;Lift    Examination-Participation Restrictions Cleaning;Community Activity;Yard Work    Stability/Clinical Decision Making Stable/Uncomplicated    Rehab Potential Good    PT Frequency 1x / week    PT Duration 6 weeks    PT  Treatment/Interventions ADLs/Self Care Home Management;Balance training;Patient/family education;Electrical Stimulation;Traction;Ultrasound;Moist Heat;Neuromuscular re-education;Manual techniques;Passive range of motion;Vestibular;Taping;Energy conservation;Dry needling;Spinal Manipulations;Joint Manipulations    PT Next Visit Plan progress strengthening for c-spine and shoulders    PT Home Exercise Plan supine neck retraction, capital flexion. Extension and rotation SNAG, ER with red t-band. prone cervical retraction/extension, scapula retraction    Consulted and Agree with Plan of Care Patient  Patient will benefit from skilled therapeutic intervention in order to improve the following deficits and impairments:  Decreased activity tolerance,Decreased strength,Decreased range of motion,Hypomobility,Postural dysfunction,Pain  Visit Diagnosis: Neck pain  Other symptoms and signs involving the musculoskeletal system  Muscle weakness (generalized)     Problem List Patient Active Problem List   Diagnosis Date Noted  . Chronic pain 07/07/2020  . Encounter to establish care 07/07/2020  . Anxiety 07/07/2020  . Carcinoma of prostate ([place]) 02/17/2020  . Subacute thyroiditis 01/07/2020  . Personal history of malignant neoplasm of prostate 12/12/2019  . Benign prostatic hyperplasia with urinary obstruction 12/12/2019  . Weak urinary stream 12/12/2019  . Hyperthyroidism 11/24/2019  . Weight loss 10/21/2019  . IBS (irritable bowel syndrome) 10/21/2019  . Dizzy spells 09/20/2019  . Intractable chronic migraine without aura 09/20/2019  . Obstructive sleep apnea syndrome 09/20/2019  . Psychophysiologic insomnia 09/20/2019  . Rectal pain 08/21/2019  . Nausea without vomiting 05/30/2019  . Atrial flutter ([place]) 04/17/2019  . Chronic atrial fibrillation ([place]) 03/02/2019  . HTN (hypertension) 03/02/2019  . Constipation 08/02/2017  . Hemorrhoids 08/02/2017  . Fall 08/18/2015  .  Radiation proctitis 01/30/2013  . Rectal bleeding 12/10/2012  . Obese 05/30/2012  . Malignant neoplasm of prostate ([place]) 08/13/2011   2:21 PM, 12/07/20 M. Sherlyn Lees, PT, DPT Physical Therapist- [place] Office Number: [phone]  [place] [address] [place], [place], [address] Phone: [phone]   Fax:  [phone]  Name: Steven [name] MRN: [id number] Date of Birth: January 29, 1951

## 2020-12-07 NOTE — Patient Instructions (Signed)
Access Code: [email or web address] URL: [email or web address] Date: 12/07/2020 Prepared by: Sherlyn Lees  Exercises Standing Shoulder Scaption - 1 x daily - 7 x weekly - 3 sets - 10 reps Push Up on Table - 1 x daily - 7 x weekly - 3 sets - 10 reps

## 2020-12-09 DIAGNOSIS — H2513 Age-related nuclear cataract, bilateral: Secondary | ICD-10-CM | POA: Diagnosis not present

## 2020-12-09 DIAGNOSIS — H40033 Anatomical narrow angle, bilateral: Secondary | ICD-10-CM | POA: Diagnosis not present

## 2020-12-14 ENCOUNTER — Other Ambulatory Visit: Payer: Self-pay

## 2020-12-14 ENCOUNTER — Encounter (HOSPITAL_COMMUNITY): Payer: Self-pay

## 2020-12-14 ENCOUNTER — Ambulatory Visit (HOSPITAL_COMMUNITY): Payer: Medicare HMO

## 2020-12-14 DIAGNOSIS — R29898 Other symptoms and signs involving the musculoskeletal system: Secondary | ICD-10-CM | POA: Diagnosis not present

## 2020-12-14 DIAGNOSIS — M6281 Muscle weakness (generalized): Secondary | ICD-10-CM | POA: Diagnosis not present

## 2020-12-14 DIAGNOSIS — M542 Cervicalgia: Secondary | ICD-10-CM | POA: Diagnosis not present

## 2020-12-14 NOTE — Therapy (Signed)
[place], [place], [address] Phone: [phone]   Fax:  [phone]  Physical Therapy Treatment  Patient Details  Name: Steven [name] MRN: [id number] Date of Birth: May 23, 1951 Referring Provider (PT): Duffy Rhody, MD   Encounter Date: 12/14/2020   PT End of Session - 12/14/20 1259    Visit Number 5    Number of Visits 6    Date for PT Re-Evaluation 12/21/20    Authorization Type [employer] HMO    Authorization Time Period auth req, [place]    PT Start Time 1300    PT Stop Time 1345    PT Time Calculation (min) 45 min    Activity Tolerance Patient tolerated treatment well    Behavior During Therapy [facility] for tasks assessed/performed           Past Medical History:  Diagnosis Date  . Arthritis   . Atrial fibrillation ([place])   . Atrial flutter ([place])   . BPH (benign prostatic hyperplasia)   . Chest pain   . Dizziness   . Dyspnea    laying down occ  . Fracture 08/17/2015   MULTIPLE RIB FRACTURES     FROM FALL   . GERD (gastroesophageal reflux disease)   . Hemorrhoids   . History of kidney stones    noted on CT scan  . History of radiation therapy 08/22/11-10/13/11   prostate  . Hyperlipemia   . Hypertension   . Hyperthyroidism   . Light headedness   . Numbness and tingling in left arm   . Numbness and tingling of both legs   . Open fracture of left elbow 08/18/2015  . Prostate cancer [facility])    prostate s/p radiation Mar 2013  . Rib fractures 08/17/2015    Past Surgical History:  Procedure Laterality Date  . BOTOX INJECTION N/A 01/14/2020   Procedure: INJECTION OF BOTOX INTO ANAL SPHINCTER;  Surgeon: Ileana Roup, MD;  Location[facility];  Service: General;  Laterality: N/A;  . COLONOSCOPY N/A 12/19/2012   [id number] 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 Normal appearing colon  . COLONOSCOPY WITH PROPOFOL N/A 10/23/2019    Procedure: COLONOSCOPY WITH PROPOFOL;  Surgeon: Daneil Dolin, MD; external and grade 2 internal hemorrhoids, abnormal rectal blood vessels consistent with radiation proctitis s/p APC therapy, otherwise normal exam.  . EVALUATION UNDER ANESTHESIA WITH ANAL FISTULECTOMY N/A 01/14/2020   Procedure: ANORECTAL EXAM UNDER ANESTHESIA;  Surgeon: Ileana Roup, MD;  [facility];  Service: General;  Laterality: N/A;  . HOT HEMOSTASIS  10/23/2019   Procedure: HOT HEMOSTASIS (ARGON PLASMA COAGULATION/BICAP);  Surgeon: Daneil Dolin, MD;  [facility];  Service: Endoscopy;;  apc rectal proctitis    . KIDNEY SURGERY  1982   kidney tube collapse repair  . RADIOACTIVE SEED IMPLANT     Prostate    There were no vitals filed for this visit.   Subjective Assessment - 12/14/20 1303    Subjective Pt reports some improvement in his neck and UE symptoms and notes most improvement over the past few days    Pertinent History prostate cancer; irritable bowel syndrome; carpal tunnel; 2017 fx radius LT    Currently in Pain? Yes    Pain Score 3     Pain Location Neck    Pain Orientation Left    Pain Descriptors / Indicators Aching    Pain Type Chronic pain  [facility] PT Assessment - 12/14/20 0001      Assessment   Medical Diagnosis Cervical spondylosis w/o myelopathy    Referring Provider (PT) Duffy Rhody, MD                         [facility] Adult PT Treatment/Exercise - 12/14/20 0001      Neck Exercises: Standing   Wall Push Ups 20 reps    Wall Wash bilateral scaption 2x10 standing against wall. 2 lbs    Other Standing Exercises shoulder extension with blue + chin retraction 3x10    Other Standing Exercises unilateral row 2x10, 4 plates left/right      Neck Exercises: Seated   Cervical Rotation Both;10 reps    Lateral Flexion Both;10 reps      Neck Exercises: Supine   Neck Retraction 10 reps;3 secs    Capital Flexion 15 reps;3 secs      Neck  Exercises: Prone   Axial Exension 15 reps                    PT Short Term Goals - 12/14/20 1324      PT SHORT TERM GOAL #1   Title Patient will be independent with HEP in order to improve functional outcomes.    Time 3    Period Weeks    Status Achieved    Target Date 11/30/20      PT SHORT TERM GOAL #2   Title Patient will report at least 25% improvement in symptoms for improved quality of life.    Baseline 30%    Time 3    Period Weeks    Status Achieved    Target Date 11/30/20             PT Long Term Goals - 12/14/20 1341      PT LONG TERM GOAL #1   Title Patient will report at least 50% improvement in overall symptoms and function to demonstrate overall improved functional ability    Time 6    Period Weeks    Status On-going      PT LONG TERM GOAL #2   Title Patient will improve on FOTO score to meet predicted outcomes to improve functional independence    Baseline will perform at next session    Time 6    Period Weeks    Status On-going                 Plan - 12/14/20 1323    Clinical Impression Statement Tolerating POC very well and good return demonstration for cervical strengthening and postural correction exercises.  Tolerating increased repetition and resistance well without adverse effects    Personal Factors and Comorbidities Behavior Pattern;Social Background;Time since onset of injury/illness/exacerbation;Transportation;Fitness;Past/Current Experience    Examination-Activity Limitations Stand;Locomotion Level;Lift    Examination-Participation Restrictions Cleaning;Community Activity;Yard Work    Stability/Clinical Decision Making Stable/Uncomplicated    Rehab Potential Good    PT Frequency 1x / week    PT Duration 6 weeks    PT Treatment/Interventions ADLs/Self Care Home Management;Balance training;Patient/family education;Electrical Stimulation;Traction;Ultrasound;Moist Heat;Neuromuscular re-education;Manual techniques;Passive  range of motion;Vestibular;Taping;Energy conservation;Dry needling;Spinal Manipulations;Joint Manipulations    PT Next Visit Plan progress strengthening for c-spine and shoulders    PT Home Exercise Plan supine neck retraction, capital flexion. Extension and rotation SNAG, ER with red t-band. prone cervical retraction/extension, scapula retraction    Consulted and Agree with Plan of Care Patient  Patient will benefit from skilled therapeutic intervention in order to improve the following deficits and impairments:  Decreased activity tolerance,Decreased strength,Decreased range of motion,Hypomobility,Postural dysfunction,Pain  Visit Diagnosis: Neck pain  Other symptoms and signs involving the musculoskeletal system  Muscle weakness (generalized)     Problem List Patient Active Problem List   Diagnosis Date Noted  . Chronic pain 07/07/2020  . Encounter to establish care 07/07/2020  . Anxiety 07/07/2020  . Carcinoma of prostate ([place]) 02/17/2020  . Subacute thyroiditis 01/07/2020  . Personal history of malignant neoplasm of prostate 12/12/2019  . Benign prostatic hyperplasia with urinary obstruction 12/12/2019  . Weak urinary stream 12/12/2019  . Hyperthyroidism 11/24/2019  . Weight loss 10/21/2019  . IBS (irritable bowel syndrome) 10/21/2019  . Dizzy spells 09/20/2019  . Intractable chronic migraine without aura 09/20/2019  . Obstructive sleep apnea syndrome 09/20/2019  . Psychophysiologic insomnia 09/20/2019  . Rectal pain 08/21/2019  . Nausea without vomiting 05/30/2019  . Atrial flutter ([place]) 04/17/2019  . Chronic atrial fibrillation ([place]) 03/02/2019  . HTN (hypertension) 03/02/2019  . Constipation 08/02/2017  . Hemorrhoids 08/02/2017  . Fall 08/18/2015  . Radiation proctitis 01/30/2013  . Rectal bleeding 12/10/2012  . Obese 05/30/2012  . Malignant neoplasm of prostate ([place]) 08/13/2011   1:43 PM, 12/14/20 M. Sherlyn Lees, PT, DPT Physical Therapist-  [place] Office Number: [phone]  [place] [address] [place], [place], [address] Phone: [phone]   Fax:  [phone]  Name: Steven [name] MRN: [id number] Date of Birth: [birth year]

## 2020-12-16 DIAGNOSIS — Z0100 Encounter for examination of eyes and vision without abnormal findings: Secondary | ICD-10-CM | POA: Diagnosis not present

## 2020-12-25 ENCOUNTER — Ambulatory Visit
Admission: EM | Admit: 2020-12-25 | Discharge: 2020-12-25 | Disposition: A | Discharge status: Home / Self Care | Payer: Medicare HMO

## 2020-12-25 DIAGNOSIS — R42 Dizziness and giddiness: Secondary | ICD-10-CM | POA: Diagnosis not present

## 2020-12-25 NOTE — ED Triage Notes (Signed)
Pt has been feeling light headed for a while and is concerned his BP may be high

## 2020-12-25 NOTE — Discharge Instructions (Addendum)
Take blood pressure medication as prescribed and schedule Record home blood pressure daily and bring it to your PCP Follow-up with PCP for referral to cardiologist Return or go to ED if you develop any new or worsening of his symptoms

## 2020-12-25 NOTE — ED Provider Notes (Signed)
[place]   [id number] 12/25/20 Arrival Time: 8416   CC: COVID symptoms  SUBJECTIVE: History from: patient.  SAMSON RALPH is a 70 y.o. male with history of A. fib  presents the urgent care for complaint of lightheadedness and high blood pressure Norco last night.  Denies any precipitating event.  Blood pressure in office is 140/78.  Patient reports blood pressure usually lower.  He has been taking Norvasc, losartan, Lasix and metoprolol.  Denies alleviating or aggravating factors.  Reported previous symptoms in the past.   Denies fever, chills, fatigue, sinus pain, rhinorrhea, sore throat, SOB, wheezing, chest pain, nausea, changes in bowel or bladder habits.    ROS: As per HPI.  All other pertinent ROS negative.     Past Medical History:  Diagnosis Date  . Arthritis   . Atrial fibrillation ([place])   . Atrial flutter ([place])   . BPH (benign prostatic hyperplasia)   . Chest pain   . Dizziness   . Dyspnea    laying down occ  . Fracture 08/17/2015   MULTIPLE RIB FRACTURES     FROM FALL   . GERD (gastroesophageal reflux disease)   . Hemorrhoids   . History of kidney stones    noted on CT scan  . History of radiation therapy 08/22/11-10/13/11   prostate  . Hyperlipemia   . Hypertension   . Hyperthyroidism   . Light headedness   . Numbness and tingling in left arm   . Numbness and tingling of both legs   . Open fracture of left elbow 08/18/2015  . Prostate cancer [facility])    prostate s/p radiation Mar 2013  . Rib fractures 08/17/2015   Past Surgical History:  Procedure Laterality Date  . BOTOX INJECTION N/A 01/14/2020   Procedure: INJECTION OF BOTOX INTO ANAL SPHINCTER;  Surgeon: Ileana Roup, MD;  Location: [facility];  Service: General;  Laterality: N/A;  . COLONOSCOPY N/A 12/19/2012   [id number] 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 Normal appearing colon  . COLONOSCOPY WITH PROPOFOL N/A 10/23/2019    Procedure: COLONOSCOPY WITH PROPOFOL;  Surgeon: Daneil Dolin, MD; external and grade 2 internal hemorrhoids, abnormal rectal blood vessels consistent with radiation proctitis s/p APC therapy, otherwise normal exam.  . EVALUATION UNDER ANESTHESIA WITH ANAL FISTULECTOMY N/A 01/14/2020   Procedure: ANORECTAL EXAM UNDER ANESTHESIA;  Surgeon: Ileana Roup, MD;  Location: [facility];  Service: General;  Laterality: N/A;  . HOT HEMOSTASIS  10/23/2019   Procedure: HOT HEMOSTASIS (ARGON PLASMA COAGULATION/BICAP);  Surgeon: Daneil Dolin, MD;  [facility];  Service: Endoscopy;;  apc rectal proctitis    . KIDNEY SURGERY  1982   kidney tube collapse repair  . RADIOACTIVE SEED IMPLANT     Prostate   Allergies  Allergen Reactions  . Latex Rash    Possible reaction to latex gloves per patient   No current facility-administered medications on file prior to encounter.   Current Outpatient Medications on File Prior to Encounter  Medication Sig Dispense Refill  . alfuzosin (UROXATRAL) 10 MG 24 hr tablet Take 1 tablet (10 mg total) by mouth daily with breakfast. 90 tablet 3  . amitriptyline (ELAVIL) 10 MG tablet     . apixaban (ELIQUIS) 5 MG TABS tablet Take 1 tablet (5 mg total) by mouth 2 (two) times daily. 60 tablet 6  . Ascorbic Acid (VITAMIN C) 1000 MG tablet Take 1,000 mg by mouth daily.    Marland Kitchen  Cholecalciferol (VITAMIN D3) 125 MCG (5000 UT) CAPS Take 1 capsule by mouth daily.     . Cyanocobalamin (B-12 PO) Take 1 tablet by mouth daily.     . diazepam (VALIUM) 5 MG tablet Take 5 mg by mouth daily as needed for anxiety.     . diclofenac (CATAFLAM) 50 MG tablet Take 50 mg by mouth 2 (two) times daily. (Patient not taking: Reported on 11/03/2020)    . Erenumab-aooe (AIMOVIG) 70 MG/ML SOAJ Inject 1 mL into the skin every 30 (thirty) days.    Marland Kitchen gabapentin (NEURONTIN) 300 MG capsule Take 300 mg by mouth 3 (three) times daily.    . hydrochlorothiazide (HYDRODIURIL) 25 MG tablet Take 25 mg by  mouth daily.    . meclizine (ANTIVERT) 25 MG tablet meclizine 25 mg tablet  TAKE 1 TABLET BY MOUTH FOUR TIMES DAILY AS NEEDED FOR DIZZINESS (Patient not taking: Reported on 11/03/2020)    . metoprolol tartrate (LOPRESSOR) 25 MG tablet Take 1 tablet (25 mg total) by mouth 2 (two) times daily. 60 tablet 2  . mirabegron ER (MYRBETRIQ) 25 MG TB24 tablet Take 1 tablet (25 mg total) by mouth daily. (Patient not taking: Reported on 11/03/2020) 30 tablet 0  . NEXLETOL 180 MG TABS Take 180 mg by mouth daily. Pt taking 2-3 times a week    . ondansetron (ZOFRAN ODT) 4 MG disintegrating tablet Take 1 tablet (4 mg total) by mouth every 8 (eight) hours as needed for nausea or vomiting. 30 tablet 0  . pantoprazole (PROTONIX) 40 MG tablet TAKE 1 TABLET BY MOUTH DAILY 30 MINUTES BEFORE BREAKFAST (Patient taking differently: Take 40 mg by mouth See admin instructions. Take 1 tablet by mouth daily 30 minutes before breakfast) 90 tablet 3  . polyethylene glycol powder (GLYCOLAX/MIRALAX) 17 GM/SCOOP powder Take 0.5 Containers by mouth daily.    . potassium chloride SA (KLOR-CON) 20 MEQ tablet Take 20 mEq by mouth 2 (two) times daily.    . Probiotic Product (PROBIOTIC-10 PO) Take 1 capsule by mouth daily.    . tadalafil (CIALIS) 20 MG tablet Take 1 tablet (20 mg total) by mouth daily as needed. (Patient not taking: Reported on 11/03/2020) 10 tablet 5  . traMADol (ULTRAM) 50 MG tablet Take 50 mg by mouth every 6 (six) hours as needed.     . Wheat Dextrin (BENEFIBER DRINK MIX PO) Take 1 oz by mouth daily.  (Patient not taking: Reported on 11/03/2020)    . zinc gluconate 50 MG tablet Take 50 mg by mouth daily.      Social History   Socioeconomic History  . Marital status: Single    Spouse name: Not on file  . Number of children: Not on file  . Years of education: Not on file  . Highest education level: Not on file  Occupational History  . Occupation: Custodian     Employer: [employer]  Tobacco Use  .  Smoking status: Former Smoker    Packs/day: 1.00    Years: 20.00    Pack years: 20.00    Types: Cigarettes    Quit date: 2020    Years since quitting: 2.4  . Smokeless tobacco: Former Systems developer  . Tobacco comment: Quit smoking x 2 years    07/2015   SOMETIIMES i USE VAPOR   Vaping Use  . Vaping Use: Never used  Substance and Sexual Activity  . Alcohol use: Never  . Drug use: Never  . Sexual activity: Not Currently  Other Topics Concern  . Not on file  Social History Narrative   Divorced   Social Determinants of Health   Financial Resource Strain: Low Risk   . Difficulty of Paying Living Expenses: Not hard at all  Food Insecurity: No Food Insecurity  . Worried About Charity fundraiser in the Last Year: Never true  . Ran Out of Food in the Last Year: Never true  Transportation Needs: No Transportation Needs  . Lack of Transportation (Medical): No  . Lack of Transportation (Non-Medical): No  Physical Activity: Inactive  . Days of Exercise per Week: 0 days  . Minutes of Exercise per Session: 0 min  Stress: No Stress Concern Present  . Feeling of Stress : Not at all  Social Connections: Socially Isolated  . Frequency of Communication with Friends and Family: Twice a week  . Frequency of Social Gatherings with Friends and Family: Once a week  . Attends Religious Services: Never  . Active Member of Clubs or Organizations: No  . Attends Archivist Meetings: Never  . Marital Status: Divorced  Human resources officer Violence: Not At Risk  . Fear of Current or Ex-Partner: No  . Emotionally Abused: No  . Physically Abused: No  . Sexually Abused: No   Family History  Problem Relation Age of Onset  . Aneurysm Mother   . Hypertension Mother   . Prostate cancer Father   . Hypertension Father   . Colon cancer Neg Hx   . Colon polyps Neg Hx     OBJECTIVE:  Vitals:   12/25/20 1514  BP: (!) 148/78  Pulse: (!) 53  Resp: 18  SpO2: 97%     General appearance: alert;  appears fatigued, but nontoxic; speaking in full sentences and tolerating own secretions HEENT: NCAT; Ears: EACs clear, TMs pearly gray; Eyes: PERRL.  EOM grossly intact. Sinuses: nontender; Nose: nares patent without rhinorrhea, Throat: oropharynx clear, tonsils non erythematous or enlarged, uvula midline  Neck: supple without LAD Lungs: unlabored respirations, symmetrical air entry;  no respiratory distress; CTAB Heart: regular rate and rhythm.  Radial pulses 2+ symmetrical bilaterally Skin: warm and dry Psychological: alert and cooperative; normal mood and affect Neuro: CN I through XII within normal limit LABS:  No results found for this or any previous visit (from the past 24 hour(s)).   ASSESSMENT & PLAN:  1. Lightheadedness     No orders of the defined types were placed in this encounter.  EKG showed sinus bradycardia with no ST elevation or depression.  QRS within normal limit. Patient is stable at discharge.  Symptom is likely related to A. fib/flutter.  He is on a blood thinner.  He was advised to follow-up with PCP for further evaluation with a cardiologist.  Discharge instructions  Take blood pressure medication as prescribed and schedule Record home blood pressure daily and bring it to your PCP Follow-up with PCP for referral to cardiologist Return or go to ED if you develop any new or worsening of his symptoms  Reviewed expectations re: course of current medical issues. Questions answered. Outlined signs and symptoms indicating need for more acute intervention. Patient verbalized understanding. After Visit Summary given.         Emerson Monte, FNP 12/25/20 1557

## 2020-12-28 ENCOUNTER — Other Ambulatory Visit: Payer: Self-pay

## 2020-12-28 ENCOUNTER — Ambulatory Visit (HOSPITAL_COMMUNITY): Payer: Medicare HMO | Attending: Surgery

## 2020-12-28 ENCOUNTER — Encounter (HOSPITAL_COMMUNITY): Payer: Self-pay

## 2020-12-28 DIAGNOSIS — M542 Cervicalgia: Secondary | ICD-10-CM | POA: Insufficient documentation

## 2020-12-28 DIAGNOSIS — M6281 Muscle weakness (generalized): Secondary | ICD-10-CM | POA: Insufficient documentation

## 2020-12-28 DIAGNOSIS — R29898 Other symptoms and signs involving the musculoskeletal system: Secondary | ICD-10-CM | POA: Diagnosis not present

## 2020-12-28 NOTE — Therapy (Signed)
[facility] [address] [place], [place], [address] Phone: [phone]   Fax:  [phone]  Physical Therapy Treatment and Discharge Summary  Patient Details  Name: Steven [name] MRN: [id number] Date of Birth: 08-Jun-1951 Referring Provider (PT): Duffy Rhody, MD  PHYSICAL THERAPY DISCHARGE SUMMARY  Visits from Start of Care: 6  Current functional level related to goals / functional outcomes: Pt able to meet 3/3 STG but not LTG. Reports decreased neck pain since start of care   Remaining deficits: none   Education / Equipment: HEP  Plan: Patient agrees to discharge.  Patient goals were partially met. Patient is being discharged due to meeting the stated rehab goals.  ?????      Encounter Date: 12/28/2020   PT End of Session - 12/28/20 1415    Visit Number 6    Number of Visits 6    Date for PT Re-Evaluation 12/21/20    Authorization Type [employer] HMO    Authorization Time Period auth req, [place]    PT Start Time 1345    PT Stop Time 1430    PT Time Calculation (min) 45 min    Activity Tolerance Patient tolerated treatment well    Behavior During Therapy [facility] for tasks assessed/performed           Past Medical History:  Diagnosis Date  . Arthritis   . Atrial fibrillation ([place])   . Atrial flutter ([place])   . BPH (benign prostatic hyperplasia)   . Chest pain   . Dizziness   . Dyspnea    laying down occ  . Fracture 08/17/2015   MULTIPLE RIB FRACTURES     FROM FALL   . GERD (gastroesophageal reflux disease)   . Hemorrhoids   . History of kidney stones    noted on CT scan  . History of radiation therapy 08/22/11-10/13/11   prostate  . Hyperlipemia   . Hypertension   . Hyperthyroidism   . Light headedness   . Numbness and tingling in left arm   . Numbness and tingling of both legs   . Open fracture of left elbow 08/18/2015  . Prostate cancer [facility])    prostate s/p radiation Mar 2013  . Rib fractures  08/17/2015    Past Surgical History:  Procedure Laterality Date  . BOTOX INJECTION N/A 01/14/2020   Procedure: INJECTION OF BOTOX INTO ANAL SPHINCTER;  Surgeon: Ileana Roup, MD;  [facility];  Service: General;  Laterality: N/A;  . COLONOSCOPY N/A 12/19/2012   [id number] 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 Normal appearing colon  . COLONOSCOPY WITH PROPOFOL N/A 10/23/2019   Procedure: COLONOSCOPY WITH PROPOFOL;  Surgeon: Daneil Dolin, MD; external and grade 2 internal hemorrhoids, abnormal rectal blood vessels consistent with radiation proctitis s/p APC therapy, otherwise normal exam.  . EVALUATION UNDER ANESTHESIA WITH ANAL FISTULECTOMY N/A 01/14/2020   Procedure: ANORECTAL EXAM UNDER ANESTHESIA;  Surgeon: Ileana Roup, MD;  [facility];  Service: General;  Laterality: N/A;  . HOT HEMOSTASIS  10/23/2019   Procedure: HOT HEMOSTASIS (ARGON PLASMA COAGULATION/BICAP);  Surgeon: Daneil Dolin, MD;  [facility];  Service: Endoscopy;;  apc rectal proctitis    . KIDNEY SURGERY  1982   kidney tube collapse repair  . RADIOACTIVE SEED IMPLANT     Prostate    There were no vitals filed for this visit.   Subjective Assessment - 12/28/20  1348    Subjective "Feeling pretty good as far as the neck goes, still have some symptoms in the back of the neck but not like it was"    Pertinent History prostate cancer; irritable bowel syndrome; carpal tunnel; 2017 fx radius LT    Currently in Pain? No/denies    Pain Score 0-No pain    Pain Location Neck    Pain Orientation Left    Pain Type Chronic pain              [facility] PT Assessment - 12/28/20 0001      Assessment   Medical Diagnosis Cervical spondylosis w/o myelopathy    Referring Provider (PT) Duffy Rhody, MD      Observation/Other Assessments   Focus on Therapeutic Outcomes (FOTO)  66% function                          [facility] Adult PT Treatment/Exercise - 12/28/20 0001      Posture/Postural Control   Posture Comments Review of HEP for chin retractions and shoulder retractions to improve and promote upright posture with neutral spine alignment to address forward head                  PT Education - 12/28/20 1647    Education Details discussion and review of POC details with pt reporting non-compliance with HEP and mostly just performing chin retractions and occasional scapular retractions with theraband.  Educated pt at length on benefits of regular physical activity and benefits of strengthening exercises    Person(s) Educated Patient    Methods Explanation    Comprehension Verbalized understanding            PT Short Term Goals - 12/14/20 1324      PT SHORT TERM GOAL #1   Title Patient will be independent with HEP in order to improve functional outcomes.    Time 3    Period Weeks    Status Achieved    Target Date 11/30/20      PT SHORT TERM GOAL #2   Title Patient will report at least 25% improvement in symptoms for improved quality of life.    Baseline 30%    Time 3    Period Weeks    Status Achieved    Target Date 11/30/20             PT Long Term Goals - 12/28/20 1651      PT LONG TERM GOAL #1   Title Patient will report at least 50% improvement in overall symptoms and function to demonstrate overall improved functional ability    Baseline 30-40% improvement    Time 6    Period Weeks    Status Not Met      PT LONG TERM GOAL #2   Title Patient will improve on FOTO score to meet predicted outcomes to improve functional independence    Baseline 66% function    Time 6    Period Weeks    Status Not Met                 Plan - 12/28/20 1651    Clinical Impression Statement Patient has tolerated tx sessions well without adverse effects and notes decrease in his posterior neck pain despite his admitted non-compliance other than perofrming chin retractions.  Patient  has met maximum potential at this time and will D/C to HEP    Personal Factors and Comorbidities Behavior  Pattern;Social Background;Time since onset of injury/illness/exacerbation;Transportation;Fitness;Past/Current Experience    Examination-Activity Limitations Stand;Locomotion Level;Lift    Examination-Participation Restrictions Cleaning;Community Activity;Yard Work    Stability/Clinical Decision Making Stable/Uncomplicated    Rehab Potential Good    PT Frequency 1x / week    PT Duration 6 weeks    PT Treatment/Interventions ADLs/Self Care Home Management;Balance training;Patient/family education;Electrical Stimulation;Traction;Ultrasound;Moist Heat;Neuromuscular re-education;Manual techniques;Passive range of motion;Vestibular;Taping;Energy conservation;Dry needling;Spinal Manipulations;Joint Manipulations    PT Next Visit Plan D/C to HEP    PT Home Exercise Plan supine neck retraction, capital flexion. Extension and rotation SNAG, ER with red t-band. prone cervical retraction/extension, scapula retraction    Consulted and Agree with Plan of Care Patient           Patient will benefit from skilled therapeutic intervention in order to improve the following deficits and impairments:  Decreased activity tolerance,Decreased strength,Decreased range of motion,Hypomobility,Postural dysfunction,Pain  Visit Diagnosis: Neck pain  Other symptoms and signs involving the musculoskeletal system  Muscle weakness (generalized)     Problem List Patient Active Problem List   Diagnosis Date Noted  . Chronic pain 07/07/2020  . Encounter to establish care 07/07/2020  . Anxiety 07/07/2020  . Carcinoma of prostate ([place]) 02/17/2020  . Subacute thyroiditis 01/07/2020  . Personal history of malignant neoplasm of prostate 12/12/2019  . Benign prostatic hyperplasia with urinary obstruction 12/12/2019  . Weak urinary stream 12/12/2019  . Hyperthyroidism 11/24/2019  . Weight loss 10/21/2019  . IBS  (irritable bowel syndrome) 10/21/2019  . Dizzy spells 09/20/2019  . Intractable chronic migraine without aura 09/20/2019  . Obstructive sleep apnea syndrome 09/20/2019  . Psychophysiologic insomnia 09/20/2019  . Rectal pain 08/21/2019  . Nausea without vomiting 05/30/2019  . Atrial flutter ([place]) 04/17/2019  . Chronic atrial fibrillation ([place]) 03/02/2019  . HTN (hypertension) 03/02/2019  . Constipation 08/02/2017  . Hemorrhoids 08/02/2017  . Fall 08/18/2015  . Radiation proctitis 01/30/2013  . Rectal bleeding 12/10/2012  . Obese 05/30/2012  . Malignant neoplasm of prostate ([place]) 08/13/2011    Toniann Fail 12/28/2020, 4:59 PM  [facility] [address] [place], [place], [address] Phone: [phone]   Fax:  [phone]  Name: GWIN EAGON MRN: [id number] Date of Birth: 08-15-1950

## 2020-12-29 ENCOUNTER — Telehealth: Payer: Self-pay | Admitting: Cardiovascular Disease

## 2020-12-29 NOTE — Telephone Encounter (Signed)
New message    STAT if patient feels like he/she is going to faint   1) Are you dizzy now? No - it is worse when he is in the heat   2) Do you feel faint or have you passed out? Feels faint with exertion  3) Do you have any other symptoms? Fatigue , he is having trouble walking long distance , he can only do short spurts, his legs get tired and he gets dizzy,   4) Have you checked your HR and BP (record if available)? hasnt checked it today , it was 123/70 ? But its been going up to the 151  His PCP has his on fluid pills and they added losartan and amlodipine to his medication regimen ?

## 2020-12-29 NOTE — Telephone Encounter (Signed)
Pt notified that Dr. Johnsie Cancel feels he is fine at this time from a cardiac standpoint. Pt will contact PCP regarding symptoms.

## 2020-12-29 NOTE — Telephone Encounter (Signed)
Pt states that he still feeling dizzy and lightheaded on and off. Pt states that he has been in contact with his PCP regarding his BP. Pt states that he is getting fatigue, and SOB when walking. Denies chest pain when walking. C/o nerve pain throughout his body. He is seeing neurology for this. He has been having fluttering in his chest. No other complaints at this time. Please advise.

## 2020-12-30 DIAGNOSIS — G43711 Chronic migraine without aura, intractable, with status migrainosus: Secondary | ICD-10-CM | POA: Diagnosis not present

## 2021-01-04 ENCOUNTER — Other Ambulatory Visit: Payer: Self-pay

## 2021-01-04 ENCOUNTER — Encounter (HOSPITAL_COMMUNITY): Payer: Self-pay

## 2021-01-04 ENCOUNTER — Emergency Department (HOSPITAL_COMMUNITY)
Admission: EM | Admit: 2021-01-04 | Discharge: 2021-01-04 | Disposition: A | Discharge status: Home / Self Care | Payer: Medicare HMO | Attending: Emergency Medicine | Admitting: Emergency Medicine

## 2021-01-04 ENCOUNTER — Emergency Department (HOSPITAL_COMMUNITY): Payer: Medicare HMO

## 2021-01-04 DIAGNOSIS — Z87891 Personal history of nicotine dependence: Secondary | ICD-10-CM | POA: Insufficient documentation

## 2021-01-04 DIAGNOSIS — Z91040 Latex allergy status: Secondary | ICD-10-CM | POA: Diagnosis not present

## 2021-01-04 DIAGNOSIS — E039 Hypothyroidism, unspecified: Secondary | ICD-10-CM | POA: Insufficient documentation

## 2021-01-04 DIAGNOSIS — Z79899 Other long term (current) drug therapy: Secondary | ICD-10-CM | POA: Insufficient documentation

## 2021-01-04 DIAGNOSIS — Z7901 Long term (current) use of anticoagulants: Secondary | ICD-10-CM | POA: Diagnosis not present

## 2021-01-04 DIAGNOSIS — R001 Bradycardia, unspecified: Secondary | ICD-10-CM | POA: Diagnosis not present

## 2021-01-04 DIAGNOSIS — I10 Essential (primary) hypertension: Secondary | ICD-10-CM | POA: Insufficient documentation

## 2021-01-04 DIAGNOSIS — Z8546 Personal history of malignant neoplasm of prostate: Secondary | ICD-10-CM | POA: Diagnosis not present

## 2021-01-04 DIAGNOSIS — E876 Hypokalemia: Secondary | ICD-10-CM | POA: Diagnosis not present

## 2021-01-04 DIAGNOSIS — R42 Dizziness and giddiness: Secondary | ICD-10-CM | POA: Diagnosis present

## 2021-01-04 DIAGNOSIS — Z20822 Contact with and (suspected) exposure to covid-19: Secondary | ICD-10-CM | POA: Diagnosis not present

## 2021-01-04 LAB — CBC WITH DIFFERENTIAL/PLATELET
Abs Immature Granulocytes: 0.01 10*3/uL (ref 0.00–0.07)
Basophils Absolute: 0 10*3/uL (ref 0.0–0.1)
Basophils Relative: 1 %
Eosinophils Absolute: 0.1 10*3/uL (ref 0.0–0.5)
Eosinophils Relative: 3 %
HCT: 40.6 % (ref 39.0–52.0)
Hemoglobin: 13.8 g/dL (ref 13.0–17.0)
Immature Granulocytes: 0 %
Lymphocytes Relative: 38 %
Lymphs Abs: 1.3 10*3/uL (ref 0.7–4.0)
MCH: 30.3 pg (ref 26.0–34.0)
MCHC: 34 g/dL (ref 30.0–36.0)
MCV: 89 fL (ref 80.0–100.0)
Monocytes Absolute: 0.2 10*3/uL (ref 0.1–1.0)
Monocytes Relative: 7 %
Neutro Abs: 1.8 10*3/uL (ref 1.7–7.7)
Neutrophils Relative %: 51 %
Platelets: 188 10*3/uL (ref 150–400)
RBC: 4.56 MIL/uL (ref 4.22–5.81)
RDW: 12.1 % (ref 11.5–15.5)
WBC: 3.5 10*3/uL — ABNORMAL LOW (ref 4.0–10.5)
nRBC: 0 % (ref 0.0–0.2)

## 2021-01-04 LAB — URINALYSIS, ROUTINE W REFLEX MICROSCOPIC
Bilirubin Urine: NEGATIVE
Glucose, UA: NEGATIVE mg/dL
Hgb urine dipstick: NEGATIVE
Ketones, ur: NEGATIVE mg/dL
Leukocytes,Ua: NEGATIVE
Nitrite: NEGATIVE
Protein, ur: NEGATIVE mg/dL
Specific Gravity, Urine: 1.002 — ABNORMAL LOW (ref 1.005–1.030)
pH: 7 (ref 5.0–8.0)

## 2021-01-04 LAB — BASIC METABOLIC PANEL
Anion gap: 7 (ref 5–15)
BUN: 11 mg/dL (ref 8–23)
CO2: 29 mmol/L (ref 22–32)
Calcium: 9.6 mg/dL (ref 8.9–10.3)
Chloride: 102 mmol/L (ref 98–111)
Creatinine, Ser: 0.83 mg/dL (ref 0.61–1.24)
GFR, Estimated: >60 mL/min (ref >60)
Glucose, Bld: 106 mg/dL — ABNORMAL HIGH (ref 70–99)
Potassium: 3.2 mmol/L — ABNORMAL LOW (ref 3.5–5.1)
Sodium: 138 mmol/L (ref 135–145)

## 2021-01-04 LAB — RESP PANEL BY RT-PCR (FLU A&B, COVID) ARPGX2
Influenza A by PCR: NEGATIVE
Influenza B by PCR: NEGATIVE
SARS Coronavirus 2 by RT PCR: NEGATIVE

## 2021-01-04 LAB — MAGNESIUM: Magnesium: 2.1 mg/dL (ref 1.7–2.4)

## 2021-01-04 MED ORDER — ONDANSETRON HCL 4 MG/2ML IJ SOLN
4.0000 mg | Freq: Once | INTRAMUSCULAR | Status: AC
  Administered 2021-01-04: 13:00:00 4 mg via INTRAVENOUS
  Filled 2021-01-04: qty 2

## 2021-01-04 MED ORDER — SODIUM CHLORIDE 0.9 % IV BOLUS
500.0000 mL | Freq: Once | INTRAVENOUS | Status: AC
  Administered 2021-01-04: 12:00:00 500 mL via INTRAVENOUS

## 2021-01-04 MED ORDER — POTASSIUM CHLORIDE CRYS ER 20 MEQ PO TBCR
40.0000 meq | EXTENDED_RELEASE_TABLET | Freq: Once | ORAL | Status: AC
  Administered 2021-01-04: 13:00:00 40 meq via ORAL
  Filled 2021-01-04: qty 2

## 2021-01-04 MED ORDER — ACETAMINOPHEN 500 MG PO TABS
1000.0000 mg | ORAL_TABLET | Freq: Once | ORAL | Status: AC
  Administered 2021-01-04: 15:00:00 1000 mg via ORAL
  Filled 2021-01-04: qty 2

## 2021-01-04 NOTE — ED Notes (Signed)
Pt in bed, pt states that he still feels a little swimmy headed, states that he would like to talk with the md one more time before he goes.

## 2021-01-04 NOTE — ED Triage Notes (Signed)
Patient arrives with c/o high blood pressure with dizziness.

## 2021-01-04 NOTE — ED Provider Notes (Signed)
[facility] EMERGENCY DEPARTMENT Provider Note   CSN: [id number] Arrival date & time: 01/04/21  1004     History Chief Complaint  Patient presents with   Dizziness   Hypertension    Steven [name] is a 70 y.o. male.  Patient presents with history of atrial fibrillation, high blood pressure, prostate hyperplasia, hypothyroidism and has had lightheadedness and elevated blood pressure worsening since this morning.  Patient started having mild symptoms yesterday that worsened this morning.  Patient was recently recommended to double his losartan blood pressure medication.  Patient is also on metoprolol 25 mg twice a day no change in that dose.  No blood in the stools.  Patient feels lightheaded with standing or walking.  No history of pacemaker.  No chest pain or shortness of breath.      Past Medical History:  Diagnosis Date   Arthritis    Atrial fibrillation (HCC)    Atrial flutter (HCC)    BPH (benign prostatic hyperplasia)    Chest pain    Dizziness    Dyspnea    laying down occ   Fracture 08/17/2015   MULTIPLE RIB FRACTURES     FROM FALL    GERD (gastroesophageal reflux disease)    Hemorrhoids    History of kidney stones    noted on CT scan   History of radiation therapy 08/22/11-10/13/11   prostate   Hyperlipemia    Hypertension    Hyperthyroidism    Light headedness    Numbness and tingling in left arm    Numbness and tingling of both legs    Open fracture of left elbow 08/18/2015   Prostate cancer [facility])    prostate s/p radiation Mar 2013   Rib fractures 08/17/2015    Patient Active Problem List   Diagnosis Date Noted   Chronic pain 07/07/2020   Encounter to establish care 07/07/2020   Anxiety 07/07/2020   Carcinoma of prostate ([place]) 02/17/2020   Subacute thyroiditis 01/07/2020   Personal history of malignant neoplasm of prostate 12/12/2019   Benign prostatic hyperplasia with urinary obstruction 12/12/2019   Weak urinary stream 12/12/2019   Hyperthyroidism  11/24/2019   Weight loss 10/21/2019   IBS (irritable bowel syndrome) 10/21/2019   Dizzy spells 09/20/2019   Intractable chronic migraine without aura 09/20/2019   Obstructive sleep apnea syndrome 09/20/2019   Psychophysiologic insomnia 09/20/2019   Rectal pain 08/21/2019   Nausea without vomiting 05/30/2019   Atrial flutter ([place]) 04/17/2019   Chronic atrial fibrillation ([place]) 03/02/2019   HTN (hypertension) 03/02/2019   Constipation 08/02/2017   Hemorrhoids 08/02/2017   Fall 08/18/2015   Radiation proctitis 01/30/2013   Rectal bleeding 12/10/2012   Obese 05/30/2012   Malignant neoplasm of prostate ([place]) 08/13/2011    Past Surgical History:  Procedure Laterality Date   BOTOX INJECTION N/A 01/14/2020   Procedure: INJECTION OF BOTOX INTO ANAL SPHINCTER;  Surgeon: Ileana Roup, MD;  Location: [facility] ORS;  Service: General;  Laterality: N/A;   COLONOSCOPY N/A 12/19/2012   VZC:HYIFOY 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 Normal appearing colon   COLONOSCOPY WITH PROPOFOL N/A 10/23/2019   Procedure: COLONOSCOPY WITH PROPOFOL;  Surgeon: Daneil Dolin, MD; external and grade 2 internal hemorrhoids, abnormal rectal blood vessels consistent with radiation proctitis s/p APC therapy, otherwise normal exam.   EVALUATION UNDER ANESTHESIA WITH ANAL FISTULECTOMY N/A 01/14/2020   Procedure: ANORECTAL EXAM UNDER ANESTHESIA;  Surgeon: Ileana Roup, MD;  Location:  [facility] ORS;  Service: General;  Laterality: N/A;   HOT HEMOSTASIS  10/23/2019   Procedure: HOT HEMOSTASIS (ARGON PLASMA COAGULATION/BICAP);  Surgeon: Daneil Dolin, MD;  Location: [facility] END[facility] SUITE;  Service: Endoscopy;;  apc rectal proctitis     KIDNEY SURGERY  1982   kidney tube collapse repair   RADIOACTIVE SEED IMPLANT     Prostate       Family History  Problem Relation Age of Onset   Aneurysm Mother    Hypertension Mother    Prostate cancer Father    Hypertension  Father    Colon cancer Neg Hx    Colon polyps Neg Hx     Social History   Tobacco Use   Smoking status: Former    Packs/day: 1.00    Years: 20.00    Pack years: 20.00    Types: Cigarettes    Quit date: 2020    Years since quitting: 2.4   Smokeless tobacco: Former   Tobacco comments:    Quit smoking x 2 years    07/2015   SOMETIIMES i USE VAPOR   Vaping Use   Vaping Use: Never used  Substance Use Topics   Alcohol use: Never   Drug use: Never    Home Medications Prior to Admission medications   Medication Sig Start Date End Date Taking? Authorizing Provider  alfuzosin (UROXATRAL) 10 MG 24 hr tablet Take 1 tablet (10 mg total) by mouth daily with breakfast. 03/31/20  Yes [name], Candee Furbish, MD  amitriptyline (ELAVIL) 10 MG tablet Take 10 mg by mouth at bedtime as needed for sleep. 07/07/20  Yes [provider]  amLODipine (NORVASC) 10 MG tablet Take 1 tablet by mouth daily. 12/08/20  Yes [provider]  apixaban (ELIQUIS) 5 MG TABS tablet Take 1 tablet (5 mg total) by mouth 2 (two) times daily. 08/24/20  Yes Josue Hector, MD  Ascorbic Acid (VITAMIN C) 1000 MG tablet Take 1,000 mg by mouth daily.   Yes [provider]  Cholecalciferol (VITAMIN D3) 125 MCG (5000 UT) CAPS Take 1 capsule by mouth daily.    Yes [provider]  Cyanocobalamin (B-12 PO) Take 1 tablet by mouth daily.    Yes [provider]  diazepam (VALIUM) 5 MG tablet Take 5 mg by mouth daily as needed for anxiety.  05/13/19  Yes [provider]  Erenumab-aooe (AIMOVIG) 70 MG/ML SOAJ Inject 1 mL into the skin every 30 (thirty) days.   Yes [provider]  gabapentin (NEURONTIN) 300 MG capsule Take 300 mg by mouth 3 (three) times daily. 02/24/20  Yes [provider]  hydrochlorothiazide (HYDRODIURIL) 25 MG tablet Take 25 mg by mouth daily. 07/28/19  Yes [provider]  losartan (COZAAR) 25 MG tablet Take 50 mg by mouth daily. 11/04/20  Yes  [provider]  metoprolol tartrate (LOPRESSOR) 25 MG tablet Take 1 tablet (25 mg total) by mouth 2 (two) times daily. 04/18/19  Yes Emokpae, Courage, MD  NEXLETOL 180 MG TABS Take 180 mg by mouth daily. Pt taking 2-3 times a week 12/03/19  Yes [provider]  pantoprazole (PROTONIX) 40 MG tablet TAKE 1 TABLET BY MOUTH DAILY 30 MINUTES BEFORE BREAKFAST Patient taking differently: Take 40 mg by mouth See admin instructions. Take 1 tablet by mouth daily 30 minutes before breakfast 05/11/20  Yes Annitta Needs, NP  polyethylene glycol powder (GLYCOLAX/MIRALAX) 17 GM/SCOOP powder Take 0.5 Containers by mouth daily.   Yes [provider]  potassium chloride SA (KLOR-CON) 20 MEQ tablet Take 20 mEq by mouth 2 (two) times daily. 12/09/19  Yes [provider]  Probiotic Product (PROBIOTIC-10 PO) Take 1 capsule by mouth daily.   Yes [provider]  traMADol (ULTRAM) 50 MG tablet Take 50 mg by mouth every 6 (six) hours as needed for moderate pain.   Yes [provider]  zinc gluconate 50 MG tablet Take 50 mg by mouth daily.    Yes [provider]  diclofenac (CATAFLAM) 50 MG tablet Take 50 mg by mouth 2 (two) times daily. Patient not taking: No sig reported    [provider]  meclizine (ANTIVERT) 25 MG tablet meclizine 25 mg tablet  TAKE 1 TABLET BY MOUTH FOUR TIMES DAILY AS NEEDED FOR DIZZINESS Patient not taking: No sig reported    [provider]  mirabegron ER (MYRBETRIQ) 25 MG TB24 tablet Take 1 tablet (25 mg total) by mouth daily. Patient not taking: No sig reported 09/20/20   Cleon Gustin, MD  ondansetron (ZOFRAN ODT) 4 MG disintegrating tablet Take 1 tablet (4 mg total) by mouth every 8 (eight) hours as needed for nausea or vomiting. Patient not taking: Reported on 01/04/2021 06/08/20   Emerson Monte, FNP  tadalafil (CIALIS) 20 MG tablet Take 1 tablet (20 mg total) by mouth daily as needed. Patient not taking:  No sig reported 09/20/20   Cleon Gustin, MD  Wheat Dextrin (BENEFIBER DRINK MIX PO) Take 1 oz by mouth daily.  Patient not taking: No sig reported    [provider]    Allergies    Latex  Review of Systems   Review of Systems  Constitutional:  Positive for fatigue. Negative for chills and fever.  HENT:  Negative for congestion.   Eyes:  Negative for visual disturbance.  Respiratory:  Negative for shortness of breath.   Cardiovascular:  Negative for chest pain.  Gastrointestinal:  Negative for abdominal pain and vomiting.  Genitourinary:  Negative for dysuria and flank pain.  Musculoskeletal:  Negative for back pain, neck pain and neck stiffness.  Skin:  Negative for rash.  Neurological:  Positive for light-headedness. Negative for syncope, speech difficulty and headaches.   Physical Exam Updated Vital Signs BP (!) 159/91   Pulse (!) 53   Temp 98.2 F (36.8 C) (Oral)   Resp 10   Ht 5\' 6"  (1.676 m)   Wt 86.2 kg   SpO2 100%   BMI 30.67 kg/m   Physical Exam Vitals and nursing note reviewed.  Constitutional:      Appearance: He is well-developed.  HENT:     Head: Normocephalic and atraumatic.     Mouth/Throat:     Mouth: Mucous membranes are dry.  Eyes:     General:        Right eye: No discharge.        Left eye: No discharge.     Conjunctiva/sclera: Conjunctivae normal.  Neck:     Trachea: No tracheal deviation.  Cardiovascular:     Rate and Rhythm: Regular rhythm. Bradycardia present.  Pulmonary:     Effort: Pulmonary effort is normal.     Breath sounds: Normal breath sounds.  Abdominal:     General: There is no distension.     Palpations: Abdomen is soft.     Tenderness: There is no abdominal tenderness. There is no guarding.  Musculoskeletal:        General: No swelling.     Cervical  back: Normal range of motion and neck supple.  Skin:    General: Skin is warm.     Findings: No rash.  Neurological:     General: No focal deficit present.      Mental Status: He is alert and oriented to person, place, and time.     Cranial Nerves: No cranial nerve deficit.  Psychiatric:        Mood and Affect: Mood normal.    ED Results / Procedures / Treatments   Labs (all labs ordered are listed, but only abnormal results are displayed) Labs Reviewed  BASIC METABOLIC PANEL - Abnormal; Notable for the following components:      Result Value   Potassium 3.2 (*)    Glucose, Bld 106 (*)    All other components within normal limits  CBC WITH DIFFERENTIAL/PLATELET - Abnormal; Notable for the following components:   WBC 3.5 (*)    All other components within normal limits  URINALYSIS, ROUTINE W REFLEX MICROSCOPIC - Abnormal; Notable for the following components:   Color, Urine COLORLESS (*)    Specific Gravity, Urine 1.002 (*)    All other components within normal limits  RESP PANEL BY RT-PCR (FLU A&B, COVID) [id number]  MAGNESIUM    EKG None  Radiology DG Chest Portable 1 View  Result Date: 01/04/2021 CLINICAL DATA:  Bradycardia. EXAM: PORTABLE CHEST 1 VIEW COMPARISON:  Chest x-ray dated July 08, 2020. FINDINGS: The heart size and mediastinal contours are within normal limits. Both lungs are clear. The visualized skeletal structures are unremarkable. IMPRESSION: No active disease. Electronically Signed   By: Titus Dubin M.D.   On: 01/04/2021 12:42    Procedures Procedures   Medications Ordered in ED Medications  sodium chloride 0.9 % bolus 500 mL (0 mLs Intravenous Stopped 01/04/21 1213)  potassium chloride SA (KLOR-CON) CR tablet 40 mEq (40 mEq Oral Given 01/04/21 1232)  ondansetron (ZOFRAN) injection 4 mg (4 mg Intravenous Given 01/04/21 1323)  acetaminophen (TYLENOL) tablet 1,000 mg (1,000 mg Oral Given 01/04/21 1454)    ED Course  I have reviewed the triage vital signs and the nursing notes.  Pertinent labs & imaging results that were available during my care of the patient were reviewed by me and considered in my  medical decision making (see chart for details).    MDM Rules/Calculators/A&P                         Patient presents with worsening lightheadedness since increased blood pressure medication.  Differential diagnosis includes symptomatic bradycardia secondary to medications versus electrolytes versus chronic changes with age/heart block.  Blood work ordered and reviewed 3.2 potassium, magnesium normal, hemoglobin normal, kidney function unremarkable.  Urinalysis no signs of infection reviewed.  EKG reviewed showing sinus bradycardia heart rate 51, normal QT, no acute ST changes, no signs of heart block.  Patient is symptomatic with standing concern for symptomatic bradycardia secondary to medication.  IV fluids/oral fluids given.  COVID test pending.  Oral potassium given. Patient did get mildly nauseated with the potassium.  Patient tolerating oral liquids, ambulated without difficulty.  No longer clinically orthostatic.  Discussed with hospitalist, patient has had work-ups for similar in the past.  Patient has good follow-up outpatient.  Discussed importance of continued follow-up for medication recheck, medication adjustments.  Patient stable for discharge.   Final Clinical Impression(s) / ED Diagnoses Final diagnoses:  Symptomatic sinus bradycardia  Hypokalemia  Primary hypertension    Rx /  DC Orders ED Discharge Orders     None        Elnora Morrison, MD 01/04/21 1459

## 2021-01-04 NOTE — Discharge Instructions (Addendum)
Reviewed blood pressure medications with your doctor. Return for chest pain, shortness of breath, persistent feelings that you are in a pass out, balance issues or new concerns. Have your potassium rechecked next week.

## 2021-01-05 DIAGNOSIS — E6609 Other obesity due to excess calories: Secondary | ICD-10-CM | POA: Diagnosis not present

## 2021-01-05 DIAGNOSIS — I10 Essential (primary) hypertension: Secondary | ICD-10-CM | POA: Diagnosis not present

## 2021-01-05 DIAGNOSIS — I700 Atherosclerosis of aorta: Secondary | ICD-10-CM | POA: Diagnosis not present

## 2021-01-05 DIAGNOSIS — I4891 Unspecified atrial fibrillation: Secondary | ICD-10-CM | POA: Diagnosis not present

## 2021-01-05 DIAGNOSIS — C61 Malignant neoplasm of prostate: Secondary | ICD-10-CM | POA: Diagnosis not present

## 2021-01-05 DIAGNOSIS — Z1331 Encounter for screening for depression: Secondary | ICD-10-CM | POA: Diagnosis not present

## 2021-01-05 DIAGNOSIS — Z0001 Encounter for general adult medical examination with abnormal findings: Secondary | ICD-10-CM | POA: Diagnosis not present

## 2021-01-05 DIAGNOSIS — Z6831 Body mass index (BMI) 31.0-31.9, adult: Secondary | ICD-10-CM | POA: Diagnosis not present

## 2021-01-05 DIAGNOSIS — Z125 Encounter for screening for malignant neoplasm of prostate: Secondary | ICD-10-CM | POA: Diagnosis not present

## 2021-01-05 DIAGNOSIS — E039 Hypothyroidism, unspecified: Secondary | ICD-10-CM | POA: Diagnosis not present

## 2021-01-17 DIAGNOSIS — I482 Chronic atrial fibrillation, unspecified: Secondary | ICD-10-CM | POA: Diagnosis not present

## 2021-01-17 DIAGNOSIS — F5104 Psychophysiologic insomnia: Secondary | ICD-10-CM | POA: Diagnosis not present

## 2021-01-17 DIAGNOSIS — I951 Orthostatic hypotension: Secondary | ICD-10-CM | POA: Diagnosis not present

## 2021-01-17 DIAGNOSIS — G43711 Chronic migraine without aura, intractable, with status migrainosus: Secondary | ICD-10-CM | POA: Diagnosis not present

## 2021-01-17 DIAGNOSIS — G990 Autonomic neuropathy in diseases classified elsewhere: Secondary | ICD-10-CM | POA: Diagnosis not present

## 2021-01-17 DIAGNOSIS — R42 Dizziness and giddiness: Secondary | ICD-10-CM | POA: Diagnosis not present

## 2021-01-26 ENCOUNTER — Ambulatory Visit: Payer: Medicare HMO | Admitting: Internal Medicine

## 2021-02-01 DIAGNOSIS — I482 Chronic atrial fibrillation, unspecified: Secondary | ICD-10-CM | POA: Diagnosis not present

## 2021-02-01 DIAGNOSIS — F5104 Psychophysiologic insomnia: Secondary | ICD-10-CM | POA: Diagnosis not present

## 2021-02-01 DIAGNOSIS — G43711 Chronic migraine without aura, intractable, with status migrainosus: Secondary | ICD-10-CM | POA: Diagnosis not present

## 2021-02-01 DIAGNOSIS — I951 Orthostatic hypotension: Secondary | ICD-10-CM | POA: Diagnosis not present

## 2021-02-01 DIAGNOSIS — G990 Autonomic neuropathy in diseases classified elsewhere: Secondary | ICD-10-CM | POA: Diagnosis not present

## 2021-02-01 DIAGNOSIS — R42 Dizziness and giddiness: Secondary | ICD-10-CM | POA: Diagnosis not present

## 2021-02-07 ENCOUNTER — Ambulatory Visit: Payer: Medicare HMO | Admitting: Nurse Practitioner

## 2021-02-08 DIAGNOSIS — I4891 Unspecified atrial fibrillation: Secondary | ICD-10-CM | POA: Diagnosis not present

## 2021-02-08 DIAGNOSIS — E6609 Other obesity due to excess calories: Secondary | ICD-10-CM | POA: Diagnosis not present

## 2021-02-08 DIAGNOSIS — I700 Atherosclerosis of aorta: Secondary | ICD-10-CM | POA: Diagnosis not present

## 2021-02-08 DIAGNOSIS — B029 Zoster without complications: Secondary | ICD-10-CM | POA: Diagnosis not present

## 2021-02-08 DIAGNOSIS — C61 Malignant neoplasm of prostate: Secondary | ICD-10-CM | POA: Diagnosis not present

## 2021-02-08 DIAGNOSIS — I10 Essential (primary) hypertension: Secondary | ICD-10-CM | POA: Diagnosis not present

## 2021-02-08 DIAGNOSIS — Z6832 Body mass index (BMI) 32.0-32.9, adult: Secondary | ICD-10-CM | POA: Diagnosis not present

## 2021-02-17 ENCOUNTER — Other Ambulatory Visit: Payer: Self-pay

## 2021-02-17 ENCOUNTER — Emergency Department (HOSPITAL_COMMUNITY): Payer: Medicare HMO

## 2021-02-17 ENCOUNTER — Emergency Department (HOSPITAL_COMMUNITY)
Admission: EM | Admit: 2021-02-17 | Discharge: 2021-02-17 | Disposition: A | Discharge status: Home / Self Care | Payer: Medicare HMO | Attending: Emergency Medicine | Admitting: Emergency Medicine

## 2021-02-17 DIAGNOSIS — Z79899 Other long term (current) drug therapy: Secondary | ICD-10-CM | POA: Insufficient documentation

## 2021-02-17 DIAGNOSIS — I10 Essential (primary) hypertension: Secondary | ICD-10-CM | POA: Diagnosis not present

## 2021-02-17 DIAGNOSIS — R42 Dizziness and giddiness: Secondary | ICD-10-CM | POA: Diagnosis not present

## 2021-02-17 DIAGNOSIS — Z8546 Personal history of malignant neoplasm of prostate: Secondary | ICD-10-CM | POA: Insufficient documentation

## 2021-02-17 DIAGNOSIS — Z91040 Latex allergy status: Secondary | ICD-10-CM | POA: Insufficient documentation

## 2021-02-17 DIAGNOSIS — Z87891 Personal history of nicotine dependence: Secondary | ICD-10-CM | POA: Insufficient documentation

## 2021-02-17 DIAGNOSIS — Z7901 Long term (current) use of anticoagulants: Secondary | ICD-10-CM | POA: Insufficient documentation

## 2021-02-17 DIAGNOSIS — Z8679 Personal history of other diseases of the circulatory system: Secondary | ICD-10-CM | POA: Insufficient documentation

## 2021-02-17 DIAGNOSIS — R531 Weakness: Secondary | ICD-10-CM | POA: Insufficient documentation

## 2021-02-17 DIAGNOSIS — Z923 Personal history of irradiation: Secondary | ICD-10-CM | POA: Diagnosis not present

## 2021-02-17 LAB — COMPREHENSIVE METABOLIC PANEL
ALT: 18 U/L (ref 0–44)
AST: 14 U/L — ABNORMAL LOW (ref 15–41)
Albumin: 3.8 g/dL (ref 3.5–5.0)
Alkaline Phosphatase: 55 U/L (ref 38–126)
Anion gap: 7 (ref 5–15)
BUN: 10 mg/dL (ref 8–23)
CO2: 29 mmol/L (ref 22–32)
Calcium: 9.3 mg/dL (ref 8.9–10.3)
Chloride: 100 mmol/L (ref 98–111)
Creatinine, Ser: 0.88 mg/dL (ref 0.61–1.24)
GFR, Estimated: >60 mL/min (ref >60)
Glucose, Bld: 105 mg/dL — ABNORMAL HIGH (ref 70–99)
Potassium: 3.5 mmol/L (ref 3.5–5.1)
Sodium: 136 mmol/L (ref 135–145)
Total Bilirubin: 0.9 mg/dL (ref 0.3–1.2)
Total Protein: 7 g/dL (ref 6.5–8.1)

## 2021-02-17 LAB — CBC WITH DIFFERENTIAL/PLATELET
Abs Immature Granulocytes: 0.04 10*3/uL (ref 0.00–0.07)
Basophils Absolute: 0 10*3/uL (ref 0.0–0.1)
Basophils Relative: 1 %
Eosinophils Absolute: 0.1 10*3/uL (ref 0.0–0.5)
Eosinophils Relative: 1 %
HCT: 42.9 % (ref 39.0–52.0)
Hemoglobin: 14.4 g/dL (ref 13.0–17.0)
Immature Granulocytes: 1 %
Lymphocytes Relative: 31 %
Lymphs Abs: 1.4 10*3/uL (ref 0.7–4.0)
MCH: 30.4 pg (ref 26.0–34.0)
MCHC: 33.6 g/dL (ref 30.0–36.0)
MCV: 90.5 fL (ref 80.0–100.0)
Monocytes Absolute: 0.4 10*3/uL (ref 0.1–1.0)
Monocytes Relative: 9 %
Neutro Abs: 2.6 10*3/uL (ref 1.7–7.7)
Neutrophils Relative %: 57 %
Platelets: 260 10*3/uL (ref 150–400)
RBC: 4.74 MIL/uL (ref 4.22–5.81)
RDW: 12.6 % (ref 11.5–15.5)
WBC: 4.6 10*3/uL (ref 4.0–10.5)
nRBC: 0 % (ref 0.0–0.2)

## 2021-02-17 LAB — URINALYSIS, ROUTINE W REFLEX MICROSCOPIC
Bilirubin Urine: NEGATIVE
Glucose, UA: NEGATIVE mg/dL
Hgb urine dipstick: NEGATIVE
Ketones, ur: NEGATIVE mg/dL
Leukocytes,Ua: NEGATIVE
Nitrite: NEGATIVE
Protein, ur: NEGATIVE mg/dL
Specific Gravity, Urine: 1.002 — ABNORMAL LOW (ref 1.005–1.030)
pH: 7 (ref 5.0–8.0)

## 2021-02-17 MED ORDER — SODIUM CHLORIDE 0.9 % IV BOLUS
1000.0000 mL | Freq: Once | INTRAVENOUS | Status: AC
Start: 2021-02-17 — End: 2021-02-17
  Administered 2021-02-17: 1000 mL via INTRAVENOUS

## 2021-02-17 NOTE — ED Triage Notes (Signed)
C/o dizziness after going to empty trash this am.  C/o nausea, SOB.

## 2021-02-17 NOTE — ED Provider Notes (Signed)
[facility] EMERGENCY DEPARTMENT Provider Note   CSN: [id number] Arrival date & time: 02/17/21  [id number]     History Chief Complaint  Patient presents with   Hypertension    Steven [name] is a 70 y.o. male.  HPI  Patient presents to the ED for dizziness.  Felt lightheaded, started as he took out the trash this morning.  This is since resolved, although he does feel weak in general.  States has been feeling weak since his A. fib diagnosis a year and a half ago, but specifically started feeling worse 2 weeks ago.  He was diagnosed with shingles and started on prednisone.  That has improved his weakness slightly, although he has been feeling more dizzy in the last day.  He he also had increase in his blood pressure medication dosing about a 1 month ago.  Ambulating makes the dizziness worse, no alleviating factors.  Past Medical History:  Diagnosis Date   Arthritis    Atrial fibrillation (HCC)    Atrial flutter (HCC)    BPH (benign prostatic hyperplasia)    Chest pain    Dizziness    Dyspnea    laying down occ   Fracture 08/17/2015   MULTIPLE RIB FRACTURES     FROM FALL    GERD (gastroesophageal reflux disease)    Hemorrhoids    History of kidney stones    noted on CT scan   History of radiation therapy 08/22/11-10/13/11   prostate   Hyperlipemia    Hypertension    Hyperthyroidism    Light headedness    Numbness and tingling in left arm    Numbness and tingling of both legs    Open fracture of left elbow 08/18/2015   Prostate cancer [facility])    prostate s/p radiation Mar 2013   Rib fractures 08/17/2015    Patient Active Problem List   Diagnosis Date Noted   Chronic pain 07/07/2020   Encounter to establish care 07/07/2020   Anxiety 07/07/2020   Carcinoma of prostate ([place]) 02/17/2020   Subacute thyroiditis 01/07/2020   Personal history of malignant neoplasm of prostate 12/12/2019   Benign prostatic hyperplasia with urinary obstruction 12/12/2019   Weak urinary stream  12/12/2019   Hyperthyroidism 11/24/2019   Weight loss 10/21/2019   IBS (irritable bowel syndrome) 10/21/2019   Dizzy spells 09/20/2019   Intractable chronic migraine without aura 09/20/2019   Obstructive sleep apnea syndrome 09/20/2019   Psychophysiologic insomnia 09/20/2019   Rectal pain 08/21/2019   Nausea without vomiting 05/30/2019   Atrial flutter ([place]) 04/17/2019   Chronic atrial fibrillation ([place]) 03/02/2019   HTN (hypertension) 03/02/2019   Constipation 08/02/2017   Hemorrhoids 08/02/2017   Fall 08/18/2015   Radiation proctitis 01/30/2013   Rectal bleeding 12/10/2012   Obese 05/30/2012   Malignant neoplasm of prostate ([place]) 08/13/2011    Past Surgical History:  Procedure Laterality Date   BOTOX INJECTION N/A 01/14/2020   Procedure: INJECTION OF BOTOX INTO ANAL SPHINCTER;  Surgeon: Ileana Roup, MD;  Location: [facility] ORS;  Service: General;  Laterality: N/A;   COLONOSCOPY N/A 12/19/2012   [id number] 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 Normal appearing colon   COLONOSCOPY WITH PROPOFOL N/A 10/23/2019   Procedure: COLONOSCOPY WITH PROPOFOL;  Surgeon: Daneil Dolin, MD; external and grade 2 internal hemorrhoids, abnormal rectal blood vessels consistent with radiation proctitis s/p APC therapy, otherwise normal exam.   EVALUATION UNDER ANESTHESIA WITH ANAL FISTULECTOMY N/A 01/14/2020  Procedure: ANORECTAL EXAM UNDER ANESTHESIA;  Surgeon: Ileana Roup, MD;  Location: [facility] ORS;  Service: General;  Laterality: N/A;   HOT HEMOSTASIS  10/23/2019   Procedure: HOT HEMOSTASIS (ARGON PLASMA COAGULATION/BICAP);  Surgeon: Daneil Dolin, MD;  Location: [facility] SUITE;  Service: Endoscopy;;  apc rectal proctitis     KIDNEY SURGERY  1982   kidney tube collapse repair   RADIOACTIVE SEED IMPLANT     Prostate       Family History  Problem Relation Age of Onset   Aneurysm Mother    Hypertension Mother    Prostate  cancer Father    Hypertension Father    Colon cancer Neg Hx    Colon polyps Neg Hx     Social History   Tobacco Use   Smoking status: Former    Packs/day: 1.00    Years: 20.00    Pack years: 20.00    Types: Cigarettes    Quit date: 2020    Years since quitting: 2.5   Smokeless tobacco: Former   Tobacco comments:    Quit smoking x 2 years    07/2015   SOMETIIMES i USE VAPOR   Vaping Use   Vaping Use: Never used  Substance Use Topics   Alcohol use: Never   Drug use: Never    Home Medications Prior to Admission medications   Medication Sig Start Date End Date Taking? Authorizing Provider  alfuzosin (UROXATRAL) 10 MG 24 hr tablet Take 1 tablet (10 mg total) by mouth daily with breakfast. 03/31/20   [name], Candee Furbish, MD  amitriptyline (ELAVIL) 10 MG tablet Take 10 mg by mouth at bedtime as needed for sleep. 07/07/20   [provider]  amLODipine (NORVASC) 10 MG tablet Take 1 tablet by mouth daily. 12/08/20   [provider]  apixaban (ELIQUIS) 5 MG TABS tablet Take 1 tablet (5 mg total) by mouth 2 (two) times daily. 08/24/20   Josue Hector, MD  Ascorbic Acid (VITAMIN C) 1000 MG tablet Take 1,000 mg by mouth daily.    [provider]  Cholecalciferol (VITAMIN D3) 125 MCG (5000 UT) CAPS Take 1 capsule by mouth daily.     [provider]  Cyanocobalamin (B-12 PO) Take 1 tablet by mouth daily.     [provider]  diazepam (VALIUM) 5 MG tablet Take 5 mg by mouth daily as needed for anxiety.  05/13/19   [provider]  diclofenac (CATAFLAM) 50 MG tablet Take 50 mg by mouth 2 (two) times daily. Patient not taking: No sig reported    [provider]  Erenumab-aooe (AIMOVIG) 70 MG/ML SOAJ Inject 1 mL into the skin every 30 (thirty) days.    [provider]  gabapentin (NEURONTIN) 300 MG capsule Take 300 mg by mouth 3 (three) times daily. 02/24/20   [provider]  hydrochlorothiazide (HYDRODIURIL) 25 MG  tablet Take 25 mg by mouth daily. 07/28/19   [provider]  losartan (COZAAR) 25 MG tablet Take 50 mg by mouth daily. 11/04/20   [provider]  meclizine (ANTIVERT) 25 MG tablet meclizine 25 mg tablet  TAKE 1 TABLET BY MOUTH FOUR TIMES DAILY AS NEEDED FOR DIZZINESS Patient not taking: No sig reported    [provider]  metoprolol tartrate (LOPRESSOR) 25 MG tablet Take 1 tablet (25 mg total) by mouth 2 (two) times daily. 04/18/19   Roxan Hockey, MD  mirabegron ER (MYRBETRIQ) 25 MG TB24 tablet Take 1 tablet (25  mg total) by mouth daily. Patient not taking: No sig reported 09/20/20   Cleon Gustin, MD  NEXLETOL 180 MG TABS Take 180 mg by mouth daily. Pt taking 2-3 times a week 12/03/19   [provider]  ondansetron (ZOFRAN ODT) 4 MG disintegrating tablet Take 1 tablet (4 mg total) by mouth every 8 (eight) hours as needed for nausea or vomiting. Patient not taking: Reported on 01/04/2021 06/08/20   Emerson Monte, FNP  pantoprazole (PROTONIX) 40 MG tablet TAKE 1 TABLET BY MOUTH DAILY 30 MINUTES BEFORE BREAKFAST Patient taking differently: Take 40 mg by mouth See admin instructions. Take 1 tablet by mouth daily 30 minutes before breakfast 05/11/20   Annitta Needs, NP  polyethylene glycol powder (GLYCOLAX/MIRALAX) 17 GM/SCOOP powder Take 0.5 Containers by mouth daily.    [provider]  potassium chloride SA (KLOR-CON) 20 MEQ tablet Take 20 mEq by mouth 2 (two) times daily. 12/09/19   [provider]  Probiotic Product (PROBIOTIC-10 PO) Take 1 capsule by mouth daily.    [provider]  tadalafil (CIALIS) 20 MG tablet Take 1 tablet (20 mg total) by mouth daily as needed. Patient not taking: No sig reported 09/20/20   Cleon Gustin, MD  traMADol (ULTRAM) 50 MG tablet Take 50 mg by mouth every 6 (six) hours as needed for moderate pain.    [provider]  Wheat Dextrin (BENEFIBER DRINK MIX PO) Take 1 oz by mouth  daily.  Patient not taking: No sig reported    [provider]  zinc gluconate 50 MG tablet Take 50 mg by mouth daily.     [provider]    Allergies    Latex  Review of Systems   Review of Systems  Constitutional:  Positive for fatigue. Negative for chills and fever.  HENT:  Negative for ear pain and sore throat.   Eyes:  Negative for pain and visual disturbance.  Respiratory:  Negative for cough and shortness of breath.   Cardiovascular:  Negative for chest pain and palpitations.  Gastrointestinal:  Negative for abdominal pain and vomiting.  Genitourinary:  Negative for dysuria and hematuria.  Musculoskeletal:  Negative for arthralgias and back pain.  Skin:  Negative for color change and rash.  Neurological:  Positive for dizziness. Negative for seizures and syncope.  All other systems reviewed and are negative.  Physical Exam Updated Vital Signs BP 132/78 (BP Location: Left Arm)   Pulse (!) 59   Temp 98.2 F (36.8 C) (Oral)   Resp 14   Ht '5\' 6"'$  (1.676 m)   Wt 90.7 kg   SpO2 98%   BMI 32.28 kg/m   Physical Exam Vitals and nursing note reviewed. Exam conducted with a chaperone present.  Constitutional:      Appearance: Normal appearance.  HENT:     Head: Normocephalic and atraumatic.  Eyes:     General: No scleral icterus.       Right eye: No discharge.        Left eye: No discharge.     Extraocular Movements: Extraocular movements intact.     Pupils: Pupils are equal, round, and reactive to light.  Cardiovascular:     Rate and Rhythm: Normal rate and regular rhythm.     Pulses: Normal pulses.     Heart sounds: Normal heart sounds. No murmur heard.   No friction rub. No gallop.  Pulmonary:     Effort: Pulmonary effort is normal. No respiratory distress.  Breath sounds: Normal breath sounds.  Abdominal:     General: Abdomen is flat. Bowel sounds are normal. There is no distension.     Palpations: Abdomen is soft.     Tenderness: There  is no abdominal tenderness.  Skin:    General: Skin is warm and dry.     Coloration: Skin is not jaundiced.     Comments: Rash in stages of healing to the left side of face. Near the hairline, no ocular involvement.  Neurological:     Mental Status: He is alert. Mental status is at baseline.     Coordination: Coordination normal.     Comments: Cranial nerves II through XII are grossly intact.    ED Results / Procedures / Treatments   Labs (all labs ordered are listed, but only abnormal results are displayed) Labs Reviewed - No data to display  EKG None  Radiology No results found.  Procedures Procedures   Medications Ordered in ED Medications  sodium chloride 0.9 % bolus 1,000 mL (has no administration in time range)    ED Course  I have reviewed the triage vital signs and the nursing notes.  Pertinent labs & imaging results that were available during my care of the patient were reviewed by me and considered in my medical decision making (see chart for details).  Clinical Course as of 02/17/21 1217  Thu Feb 17, 2021  1055 Resp(!): 26 Re-eval: 15 [HS]  1216 Comprehensive metabolic panel(!) No electrolyte derangement, no AKI, no LFT elevation concerning for hepatitis. [HS]  1216 Urinalysis, Routine w reflex microscopic Urine, Clean Catch(!) No signs of urinary tract infection [HS]  1216 CBC with Differential No anemia, no leukocytosis [HS]  1216 CT Head Wo Contrast No acute changes, no intracranial hemorrhages  [HS]  1216 EKG 12-Lead Normal sinus rhythm, no signs of ST elevation or bradycardia or heart block [HS]    Clinical Course User Index [HS] Sherrill Raring, PA-C   MDM Rules/Calculators/A&P                           Patient vitals are stable, he is not currently in atrial fibrillation.  He has not missed any doses of medication, although he did recently start prednisone for shingles per the patient.  Possible changes in medication may be contributing to him  feeling more dizzy lately, also could be feeling dizzy because of the heat and because he is not well-hydrated.  We will check labs for signs of hypertensive urgency or underlying signs of infection.  Will reassess after fluids and work-up.  Patient reports feeling improved with fluids.  Please see ED course for interpretation of labs and imaging.  Patient vitals are stable, he is resting comfortably.  Discussed results with him and I do not see any signs of emergent pathology requiring admission or further work-up.  I suspect his weakness is likely due to being dehydrated and trying to stay active in the heat, although recent changes in medication could be contributory.  Discussed return precautions and advised him to follow-up with his primary care doctor tomorrow or early next week.  Patient verbalized understanding agreement with the plan.  Final Clinical Impression(s) / ED Diagnoses Final diagnoses:  None    Rx / DC Orders ED Discharge Orders     None        Sherrill Raring, Hershal Coria 02/17/21 1218    Milton Ferguson, MD 02/21/21 1043

## 2021-02-17 NOTE — Discharge Instructions (Addendum)
Continue taking your home medicine and staying hydrated. Work-up today was reassuring, no signs of emergent pathology causing her lightheadedness. Please walk with her primary care doctor early next week or tomorrow to be reevaluated.  If things change or worsen please return back to the ED.

## 2021-02-21 NOTE — Progress Notes (Signed)
Cardiology Office Note    Date:  02/28/2021   ID:  Steven [name], DOB [birth year], MRN [id number]  PCP:  Redmond School, MD  Cardiologist: Jenkins Rouge, MD    No chief complaint on file.   History of Present Illness:    Steven [name] is a 70 y.o. male with past medical history of paroxysmal atrial fibrillation/flutter, HTN, and HLD F/U Cardiac monitor done 04/09/19 for dizziness showed no long pauses and no more PAF. Myovue done 06/25/19 with no  Ischemia EF 62% Echo 03/02/19 EF 60-65% no valve disease Lopressor was decreased with no change in dizziness  Still has dizziness, swimmy headed feeling  Now on eliquis for anticoagulation BP only [id number] systolic and w/u negative  Seen in ER 05/14/20 with elevated BP and malaise with dizziness  He sees Dr Merlene Laughter for headaches and was seen in ER for this 06/04/20 Takes Topamax and Gabapentin CT head negative for bleed or other pathology   Continues to have his issues with likely panic attacks with headaches, pounding in head and dizziness Multiple somatic complaints seem to eminate from anxiety  Seen again in ED 02/17/21 with fatigue and dizziness BP/HR telemetry normal   Was on prednisone for shingles ECG SR rate 56 normal 02/18/21   Herpetic lesions on forehead healing Still with nausea, feeling like his head is full dizziness all sound More vestibular   Past Medical History:  Diagnosis Date   Arthritis    Atrial fibrillation (HCC)    Atrial flutter (HCC)    BPH (benign prostatic hyperplasia)    Chest pain    Dizziness    Dyspnea    laying down occ   Fracture 08/17/2015   MULTIPLE RIB FRACTURES     FROM FALL    GERD (gastroesophageal reflux disease)    Hemorrhoids    History of kidney stones    noted on CT scan   History of radiation therapy 08/22/11-10/13/11   prostate   Hyperlipemia    Hypertension    Hyperthyroidism    Light headedness    Numbness and tingling in left arm    Numbness and tingling of both legs    Open  fracture of left elbow 08/18/2015   Prostate cancer [facility])    prostate s/p radiation Mar 2013   Rib fractures 08/17/2015    Past Surgical History:  Procedure Laterality Date   BOTOX INJECTION N/A 01/14/2020   Procedure: INJECTION OF BOTOX INTO ANAL SPHINCTER;  Surgeon: Ileana Roup, MD;  Location: [facility];  Service: General;  Laterality: N/A;   COLONOSCOPY N/A 12/19/2012   [id number] 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 Normal appearing colon   COLONOSCOPY WITH PROPOFOL N/A 10/23/2019   Procedure: COLONOSCOPY WITH PROPOFOL;  Surgeon: Daneil Dolin, MD; external and grade 2 internal hemorrhoids, abnormal rectal blood vessels consistent with radiation proctitis s/p APC therapy, otherwise normal exam.   EVALUATION UNDER ANESTHESIA WITH ANAL FISTULECTOMY N/A 01/14/2020   Procedure: ANORECTAL EXAM UNDER ANESTHESIA;  Surgeon: Ileana Roup, MD;  Location: [facility];  Service: General;  Laterality: N/A;   HOT HEMOSTASIS  10/23/2019   Procedure: HOT HEMOSTASIS (ARGON PLASMA COAGULATION/BICAP);  Surgeon: Daneil Dolin, MD;  Location: [facility];  Service: Endoscopy;;  apc rectal proctitis     KIDNEY SURGERY  1982   kidney tube collapse repair   RADIOACTIVE SEED IMPLANT     Prostate    Current Medications: Outpatient  Medications Prior to Visit  Medication Sig Dispense Refill   alfuzosin (UROXATRAL) 10 MG 24 hr tablet Take 1 tablet (10 mg total) by mouth daily with breakfast. 90 tablet 3   amitriptyline (ELAVIL) 10 MG tablet Take 10 mg by mouth at bedtime as needed for sleep.     amLODipine (NORVASC) 10 MG tablet Take 1 tablet by mouth daily.     apixaban (ELIQUIS) 5 MG TABS tablet Take 1 tablet (5 mg total) by mouth 2 (two) times daily. 60 tablet 6   Ascorbic Acid (VITAMIN C) 1000 MG tablet Take 1,000 mg by mouth daily.     Cholecalciferol (VITAMIN D3) 125 MCG (5000 UT) CAPS Take 1 capsule by mouth daily.       Cyanocobalamin (B-12 PO) Take 1 tablet by mouth daily.      diazepam (VALIUM) 5 MG tablet Take 5 mg by mouth daily as needed for anxiety.      Erenumab-aooe (AIMOVIG) 70 MG/ML SOAJ Inject 1 mL into the skin every 30 (thirty) days.     gabapentin (NEURONTIN) 300 MG capsule Take 300 mg by mouth 3 (three) times daily.     hydrochlorothiazide (HYDRODIURIL) 25 MG tablet Take 25 mg by mouth daily.     losartan (COZAAR) 25 MG tablet Take 50 mg by mouth daily.     meclizine (ANTIVERT) 25 MG tablet meclizine 25 mg tablet  TAKE 1 TABLET BY MOUTH FOUR TIMES DAILY AS NEEDED FOR DIZZINESS     metoprolol tartrate (LOPRESSOR) 25 MG tablet Take 1 tablet (25 mg total) by mouth 2 (two) times daily. 60 tablet 2   mirabegron ER (MYRBETRIQ) 25 MG TB24 tablet Take 1 tablet (25 mg total) by mouth daily. 30 tablet 0   NEXLETOL 180 MG TABS Take 180 mg by mouth daily. Pt taking 2-3 times a week     ondansetron (ZOFRAN ODT) 4 MG disintegrating tablet Take 1 tablet (4 mg total) by mouth every 8 (eight) hours as needed for nausea or vomiting. 30 tablet 0   pantoprazole (PROTONIX) 40 MG tablet TAKE 1 TABLET BY MOUTH DAILY 30 MINUTES BEFORE BREAKFAST (Patient taking differently: Take 40 mg by mouth See admin instructions. Take 1 tablet by mouth daily 30 minutes before breakfast) 90 tablet 3   polyethylene glycol powder (GLYCOLAX/MIRALAX) 17 GM/SCOOP powder Take 0.5 Containers by mouth daily.     potassium chloride SA (KLOR-CON) 20 MEQ tablet Take 20 mEq by mouth 2 (two) times daily.     Probiotic Product (PROBIOTIC-10 PO) Take 1 capsule by mouth daily.     tadalafil (CIALIS) 20 MG tablet Take 1 tablet (20 mg total) by mouth daily as needed. 10 tablet 5   traMADol (ULTRAM) 50 MG tablet Take 50 mg by mouth every 6 (six) hours as needed for moderate pain.     Wheat Dextrin (BENEFIBER DRINK MIX PO) Take 1 oz by mouth daily.     zinc gluconate 50 MG tablet Take 50 mg by mouth daily.      diclofenac (CATAFLAM) 50 MG tablet Take 50  mg by mouth 2 (two) times daily. (Patient not taking: No sig reported)     No facility-administered medications prior to visit.     Allergies:   Latex   Social History   Socioeconomic History   Marital status: Single    Spouse name: Not on file   Number of children: Not on file   Years of education: Not on file   Highest education level: Not  on file  Occupational History   Occupation: Custodian     Employer: [employer]  Tobacco Use   Smoking status: Former    Packs/day: 1.00    Years: 20.00    Pack years: 20.00    Types: Cigarettes    Quit date: 2020    Years since quitting: 2.6   Smokeless tobacco: Former   Tobacco comments:    Quit smoking x 2 years    07/2015   SOMETIIMES i USE VAPOR   Vaping Use   Vaping Use: Never used  Substance and Sexual Activity   Alcohol use: Never   Drug use: Never   Sexual activity: Not Currently  Other Topics Concern   Not on file  Social History Narrative   Divorced   Social Determinants of Health   Financial Resource Strain: Low Risk    Difficulty of Paying Living Expenses: Not hard at all  Food Insecurity: No Food Insecurity   Worried About Charity fundraiser in the Last Year: Never true   Arboriculturist in the Last Year: Never true  Transportation Needs: No Transportation Needs   Lack of Transportation (Medical): No   Lack of Transportation (Non-Medical): No  Physical Activity: Inactive   Days of Exercise per Week: 0 days   Minutes of Exercise per Session: 0 min  Stress: No Stress Concern Present   Feeling of Stress : Not at all  Social Connections: Socially Isolated   Frequency of Communication with Friends and Family: Twice a week   Frequency of Social Gatherings with Friends and Family: Once a week   Attends Religious Services: Never   Marine scientist or Organizations: No   Attends Music therapist: Never   Marital Status: Divorced     Family History:  The patient's family history  includes Aneurysm in his mother; Prostate cancer in his father.   Review of Systems:   Please see the history of present illness.     General:  No chills, fever, night sweats or weight changes.  Cardiovascular:  No chest pain, dyspnea on exertion, edema, orthopnea, palpitations, paroxysmal nocturnal dyspnea. Dermatological: No rash, lesions/masses Respiratory: No cough, dyspnea Urologic: No hematuria, dysuria Abdominal:   No nausea, vomiting, diarrhea, melena, or hematemesis. Positive for BRBPR.  Neurologic:  No visual changes, wkns, changes in mental status. Positive for dizziness.    All other systems reviewed and are otherwise negative except as noted above.   Physical Exam:    VS:  BP 138/84   Pulse 80   Ht '5\' 6"'$  (1.676 m)   Wt 93.4 kg   SpO2 96%   BMI 33.25 kg/m     Affect appropriate Healthy:  appears stated age HEENT: healing herpetic breakout on brow  Neck supple with no adenopathy JVP normal no bruits no thyromegaly Lungs clear with no wheezing and good diaphragmatic motion Heart:  S1/S2 no murmur, no rub, gallop or click PMI normal Abdomen: benighn, BS positve, no tenderness, no AAA no bruit.  No HSM or HJR Distal pulses intact with no bruits No edema Neuro non-focal Skin warm and dry No muscular weakness   Wt Readings from Last 3 Encounters:  02/28/21 93.4 kg  02/17/21 90.7 kg  01/04/21 86.2 kg     Studies/Labs Reviewed:   EKG: See HPI   Recent Labs: 06/08/2020: B Natriuretic Peptide 15.0 07/14/2020: TSH 3.630 01/04/2021: Magnesium 2.1 02/17/2021: ALT 18; BUN 10; Creatinine, Ser 0.88; Hemoglobin 14.4; Platelets 260;  Potassium 3.5; Sodium 136   Lipid Panel    Component Value Date/Time   CHOL 142 04/16/2020 0000   TRIG 74 04/16/2020 0000   HDL 48 04/16/2020 0000   CHOLHDL 4.4 03/02/2019 0329   VLDL 18 03/02/2019 0329   LDLCALC 79 04/16/2020 0000    Additional studies/ records that were reviewed today include:   Echocardiogram:  02/2019 IMPRESSIONS      1. The left ventricle has normal systolic function with an ejection fraction of 60-65%. The cavity size was normal. Left ventricular diastolic parameters were normal.  2. The right ventricle has normal systolic function. The cavity was normal. There is no increase in right ventricular wall thickness.  3. Mild thickening of the mitral valve leaflet.  4. The aortic valve is tricuspid. Mild thickening of the aortic valve. Mild calcification of the aortic valve.  5. The aorta is normal in size and structure.    Event Monitor: 03/2019 NSR  No significant arrhythmia No PAF  Myovue 06/25/19:  EF 62% soft tissue attenuation no ischemia   Assessment:    No diagnosis found.   Plan:   In order of problems listed above:  1. Paroxysmal Atrial Fibrillation/Flutter - Event monitor demonstrated no recurrence 04/09/19  but he was hospitalized at the end of 03/2019 for recurrent atrial fibrillation and spontaneously converted back to NSR. He continues to experience dizziness and I suspect this is not secondary to a cardiac etiology He had no improvement in his dizziness with decreasing his lopressor    2. HTN - Well controlled.  Continue current medications and low sodium Dash type diet.    3. HLD - followed by PCP. ? Now on Nexlitol previously on statin   4. Dizziness/Chest Pain - he continues to have dizziness which is being worked-up by Neurology and ENT. Had myovue 06/25/19 that was normal With no ischemia , soft tissue attenuation EF 62% low risk study. Do not feel that further cardiac w/u needed at this time . CT head 06/04/20 negative   He has had panic attacks in past and I suspect this is also playing a role in his frequent ER visits   F/u with cards in a year     Signed, Jenkins Rouge, MD  02/28/2021 9:10 AM    [facility] [address] [place], [place] [address] Phone: [phone] Fax: [phone]

## 2021-02-28 ENCOUNTER — Encounter: Payer: Self-pay | Admitting: Cardiovascular Disease

## 2021-02-28 ENCOUNTER — Ambulatory Visit: Payer: Medicare HMO | Admitting: Cardiovascular Disease

## 2021-02-28 ENCOUNTER — Other Ambulatory Visit: Payer: Self-pay

## 2021-02-28 VITALS — BP 138/84 | HR 80 | Ht 66.0 in | Wt 206.0 lb

## 2021-02-28 DIAGNOSIS — E782 Mixed hyperlipidemia: Secondary | ICD-10-CM

## 2021-02-28 DIAGNOSIS — I480 Paroxysmal atrial fibrillation: Secondary | ICD-10-CM

## 2021-02-28 DIAGNOSIS — H832X3 Labyrinthine dysfunction, bilateral: Secondary | ICD-10-CM | POA: Diagnosis not present

## 2021-02-28 DIAGNOSIS — I10 Essential (primary) hypertension: Secondary | ICD-10-CM

## 2021-02-28 NOTE — Patient Instructions (Signed)
Medication Instructions:  Your physician recommends that you continue on your current medications as directed. Please refer to the Current Medication list given to you today.  *If you need a refill on your cardiac medications before your next appointment, please call your pharmacy*   Lab Work: NONE   If you have labs (blood work) drawn today and your tests are completely normal, you will receive your results only by: . MyChart Message (if you have MyChart) OR . A paper copy in the mail If you have any lab test that is abnormal or we need to change your treatment, we will call you to review the results.   Testing/Procedures: NONE    Follow-Up: At [facility], you and your health needs are our priority.  As part of our continuing mission to provide you with exceptional heart care, we have created designated Provider Care Teams.  These Care Teams include your primary Cardiologist (physician) and Advanced Practice Providers (APPs -  Physician Assistants and Nurse Practitioners) who all work together to provide you with the care you need, when you need it.  We recommend signing up for the patient portal called "MyChart".  Sign up information is provided on this After Visit Summary.  MyChart is used to connect with patients for Virtual Visits (Telemedicine).  Patients are able to view lab/test results, encounter notes, upcoming appointments, etc.  Non-urgent messages can be sent to your provider as well.   To learn more about what you can do with MyChart, go to [email or web address].    Your next appointment:   1 year(s)  The format for your next appointment:   In Person  Provider:   Peter Nishan, MD   Other Instructions Thank you for choosing [facility]!    

## 2021-03-03 ENCOUNTER — Other Ambulatory Visit: Payer: Self-pay

## 2021-03-03 ENCOUNTER — Encounter (HOSPITAL_COMMUNITY): Payer: Self-pay | Admitting: *Deleted

## 2021-03-03 ENCOUNTER — Emergency Department (HOSPITAL_COMMUNITY)
Admission: EM | Admit: 2021-03-03 | Discharge: 2021-03-03 | Disposition: A | Discharge status: Home / Self Care | Payer: Medicare HMO | Attending: Emergency Medicine | Admitting: Emergency Medicine

## 2021-03-03 DIAGNOSIS — R42 Dizziness and giddiness: Secondary | ICD-10-CM

## 2021-03-03 DIAGNOSIS — I4891 Unspecified atrial fibrillation: Secondary | ICD-10-CM | POA: Diagnosis not present

## 2021-03-03 DIAGNOSIS — Z7901 Long term (current) use of anticoagulants: Secondary | ICD-10-CM | POA: Insufficient documentation

## 2021-03-03 DIAGNOSIS — R110 Nausea: Secondary | ICD-10-CM

## 2021-03-03 DIAGNOSIS — K581 Irritable bowel syndrome with constipation: Secondary | ICD-10-CM | POA: Diagnosis not present

## 2021-03-03 DIAGNOSIS — B999 Unspecified infectious disease: Secondary | ICD-10-CM | POA: Diagnosis not present

## 2021-03-03 DIAGNOSIS — I10 Essential (primary) hypertension: Secondary | ICD-10-CM | POA: Insufficient documentation

## 2021-03-03 DIAGNOSIS — R519 Headache, unspecified: Secondary | ICD-10-CM

## 2021-03-03 DIAGNOSIS — R6889 Other general symptoms and signs: Secondary | ICD-10-CM | POA: Diagnosis not present

## 2021-03-03 DIAGNOSIS — Z87891 Personal history of nicotine dependence: Secondary | ICD-10-CM | POA: Insufficient documentation

## 2021-03-03 DIAGNOSIS — R55 Syncope and collapse: Secondary | ICD-10-CM | POA: Diagnosis not present

## 2021-03-03 DIAGNOSIS — F4320 Adjustment disorder, unspecified: Secondary | ICD-10-CM | POA: Diagnosis not present

## 2021-03-03 DIAGNOSIS — Z91040 Latex allergy status: Secondary | ICD-10-CM | POA: Diagnosis not present

## 2021-03-03 DIAGNOSIS — Z681 Body mass index (BMI) 19 or less, adult: Secondary | ICD-10-CM | POA: Diagnosis not present

## 2021-03-03 DIAGNOSIS — Z79899 Other long term (current) drug therapy: Secondary | ICD-10-CM | POA: Insufficient documentation

## 2021-03-03 DIAGNOSIS — R112 Nausea with vomiting, unspecified: Secondary | ICD-10-CM | POA: Diagnosis not present

## 2021-03-03 DIAGNOSIS — G43711 Chronic migraine without aura, intractable, with status migrainosus: Secondary | ICD-10-CM | POA: Diagnosis not present

## 2021-03-03 DIAGNOSIS — Z8546 Personal history of malignant neoplasm of prostate: Secondary | ICD-10-CM | POA: Insufficient documentation

## 2021-03-03 LAB — COMPREHENSIVE METABOLIC PANEL
ALT: 15 U/L (ref 0–44)
AST: 16 U/L (ref 15–41)
Albumin: 4.1 g/dL (ref 3.5–5.0)
Alkaline Phosphatase: 50 U/L (ref 38–126)
Anion gap: 7 (ref 5–15)
BUN: 10 mg/dL (ref 8–23)
CO2: 29 mmol/L (ref 22–32)
Calcium: 9.6 mg/dL (ref 8.9–10.3)
Chloride: 101 mmol/L (ref 98–111)
Creatinine, Ser: 0.84 mg/dL (ref 0.61–1.24)
GFR, Estimated: >60 mL/min (ref >60)
Glucose, Bld: 105 mg/dL — ABNORMAL HIGH (ref 70–99)
Potassium: 3.4 mmol/L — ABNORMAL LOW (ref 3.5–5.1)
Sodium: 137 mmol/L (ref 135–145)
Total Bilirubin: 0.9 mg/dL (ref 0.3–1.2)
Total Protein: 7.1 g/dL (ref 6.5–8.1)

## 2021-03-03 LAB — CBC WITH DIFFERENTIAL/PLATELET
Abs Immature Granulocytes: 0.01 10*3/uL (ref 0.00–0.07)
Basophils Absolute: 0 10*3/uL (ref 0.0–0.1)
Basophils Relative: 0 %
Eosinophils Absolute: 0.1 10*3/uL (ref 0.0–0.5)
Eosinophils Relative: 1 %
HCT: 41.5 % (ref 39.0–52.0)
Hemoglobin: 14 g/dL (ref 13.0–17.0)
Immature Granulocytes: 0 %
Lymphocytes Relative: 29 %
Lymphs Abs: 1.5 10*3/uL (ref 0.7–4.0)
MCH: 30 pg (ref 26.0–34.0)
MCHC: 33.7 g/dL (ref 30.0–36.0)
MCV: 89.1 fL (ref 80.0–100.0)
Monocytes Absolute: 0.3 10*3/uL (ref 0.1–1.0)
Monocytes Relative: 6 %
Neutro Abs: 3.3 10*3/uL (ref 1.7–7.7)
Neutrophils Relative %: 64 %
Platelets: 177 10*3/uL (ref 150–400)
RBC: 4.66 MIL/uL (ref 4.22–5.81)
RDW: 12.3 % (ref 11.5–15.5)
WBC: 5.2 10*3/uL (ref 4.0–10.5)
nRBC: 0 % (ref 0.0–0.2)

## 2021-03-03 MED ORDER — SODIUM CHLORIDE 0.9 % IV BOLUS
500.0000 mL | Freq: Once | INTRAVENOUS | Status: AC
  Administered 2021-03-03: 500 mL via INTRAVENOUS

## 2021-03-03 NOTE — ED Triage Notes (Signed)
Pt c/o dizziness and feeling like he is going to pass out; pt c/o nausea and headache that started today

## 2021-03-03 NOTE — Discharge Instructions (Addendum)
You were seen in the emergency department for continued dizziness lightheadedness nausea and headache .  You had blood work and an EKG that did not show any significant abnormalities.  Please follow-up with your primary care doctor and your neurologist.  Return to the emergency department for any worsening or concerning symptoms.

## 2021-03-03 NOTE — ED Provider Notes (Signed)
[facility] EMERGENCY DEPARTMENT Provider Note   CSN: [id number] Arrival date & time: 03/03/21  1910     History Chief Complaint  Patient presents with   Near Syncope    Steven [name] is a 70 y.o. male.  He is here with multiple complaints.  He said he had a headache and saw his neurologist today and got a shot.  He is also feeling lightheaded.  On and off nausea.  Decreased appetite.  Feeling a fluttering in his chest.  Sounds like the symptoms been going on for months.  He has a history of A. fib and is on metoprolol and Eliquis.  He has chronic pain and is on Neurontin but he said he has been cutting back on that and does not know if he is withdrawing.  He said he was diagnosed with shingles 2 weeks ago went through a course of Valtrex and prednisone.  Has not felt good since  The history is provided by the patient.  Near Syncope This is a recurrent problem. Episode onset: months. The problem occurs daily. The problem has not changed since onset.Associated symptoms include headaches. Pertinent negatives include no chest pain, no abdominal pain and no shortness of breath. Exacerbated by: being in heat. Nothing relieves the symptoms. He has tried nothing for the symptoms. The treatment provided no relief.      Past Medical History:  Diagnosis Date   Arthritis    Atrial fibrillation (HCC)    Atrial flutter (HCC)    BPH (benign prostatic hyperplasia)    Chest pain    Dizziness    Dyspnea    laying down occ   Fracture 08/17/2015   MULTIPLE RIB FRACTURES     FROM FALL    GERD (gastroesophageal reflux disease)    Hemorrhoids    History of kidney stones    noted on CT scan   History of radiation therapy 08/22/11-10/13/11   prostate   Hyperlipemia    Hypertension    Hyperthyroidism    Light headedness    Numbness and tingling in left arm    Numbness and tingling of both legs    Open fracture of left elbow 08/18/2015   Prostate cancer [facility])    prostate s/p radiation Mar 2013    Rib fractures 08/17/2015    Patient Active Problem List   Diagnosis Date Noted   Chronic pain 07/07/2020   Encounter to establish care 07/07/2020   Anxiety 07/07/2020   Carcinoma of prostate ([place]) 02/17/2020   Subacute thyroiditis 01/07/2020   Personal history of malignant neoplasm of prostate 12/12/2019   Benign prostatic hyperplasia with urinary obstruction 12/12/2019   Weak urinary stream 12/12/2019   Hyperthyroidism 11/24/2019   Weight loss 10/21/2019   IBS (irritable bowel syndrome) 10/21/2019   Dizzy spells 09/20/2019   Intractable chronic migraine without aura 09/20/2019   Obstructive sleep apnea syndrome 09/20/2019   Psychophysiologic insomnia 09/20/2019   Rectal pain 08/21/2019   Nausea without vomiting 05/30/2019   Atrial flutter ([place]) 04/17/2019   Chronic atrial fibrillation ([place]) 03/02/2019   HTN (hypertension) 03/02/2019   Constipation 08/02/2017   Hemorrhoids 08/02/2017   Fall 08/18/2015   Radiation proctitis 01/30/2013   Rectal bleeding 12/10/2012   Obese 05/30/2012   Malignant neoplasm of prostate ([place]) 08/13/2011    Past Surgical History:  Procedure Laterality Date   BOTOX INJECTION N/A 01/14/2020   Procedure: INJECTION OF BOTOX INTO ANAL SPHINCTER;  Surgeon: Ileana Roup, MD;  Location: [facility] ORS;  Service: General;  Laterality: N/A;   COLONOSCOPY N/A 12/19/2012   [id number] 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 Normal appearing colon   COLONOSCOPY WITH PROPOFOL N/A 10/23/2019   Procedure: COLONOSCOPY WITH PROPOFOL;  Surgeon: Daneil Dolin, MD; external and grade 2 internal hemorrhoids, abnormal rectal blood vessels consistent with radiation proctitis s/p APC therapy, otherwise normal exam.   EVALUATION UNDER ANESTHESIA WITH ANAL FISTULECTOMY N/A 01/14/2020   Procedure: ANORECTAL EXAM UNDER ANESTHESIA;  Surgeon: Ileana Roup, MD;  Location: [facility] ORS;  Service: General;  Laterality: N/A;    HOT HEMOSTASIS  10/23/2019   Procedure: HOT HEMOSTASIS (ARGON PLASMA COAGULATION/BICAP);  Surgeon: Daneil Dolin, MD;  Location: [facility] SUITE;  Service: Endoscopy;;  apc rectal proctitis     KIDNEY SURGERY  1982   kidney tube collapse repair   RADIOACTIVE SEED IMPLANT     Prostate       Family History  Problem Relation Age of Onset   Aneurysm Mother    Hypertension Mother    Prostate cancer Father    Hypertension Father    Colon cancer Neg Hx    Colon polyps Neg Hx     Social History   Tobacco Use   Smoking status: Former    Packs/day: 1.00    Years: 20.00    Pack years: 20.00    Types: Cigarettes    Quit date: 2020    Years since quitting: 2.6   Smokeless tobacco: Former   Tobacco comments:    Quit smoking x 2 years    07/2015   SOMETIIMES i USE VAPOR   Vaping Use   Vaping Use: Never used  Substance Use Topics   Alcohol use: Never   Drug use: Never    Home Medications Prior to Admission medications   Medication Sig Start Date End Date Taking? Authorizing Provider  alfuzosin (UROXATRAL) 10 MG 24 hr tablet Take 1 tablet (10 mg total) by mouth daily with breakfast. 03/31/20   [name], Candee Furbish, MD  amitriptyline (ELAVIL) 10 MG tablet Take 10 mg by mouth at bedtime as needed for sleep. 07/07/20   [provider]  amLODipine (NORVASC) 10 MG tablet Take 1 tablet by mouth daily. 12/08/20   [provider]  apixaban (ELIQUIS) 5 MG TABS tablet Take 1 tablet (5 mg total) by mouth 2 (two) times daily. 08/24/20   Josue Hector, MD  Ascorbic Acid (VITAMIN C) 1000 MG tablet Take 1,000 mg by mouth daily.    [provider]  Cholecalciferol (VITAMIN D3) 125 MCG (5000 UT) CAPS Take 1 capsule by mouth daily.     [provider]  Cyanocobalamin (B-12 PO) Take 1 tablet by mouth daily.     [provider]  diazepam (VALIUM) 5 MG tablet Take 5 mg by mouth daily as needed for anxiety.  05/13/19   [provider]  diclofenac  (CATAFLAM) 50 MG tablet Take 50 mg by mouth 2 (two) times daily. Patient not taking: No sig reported    [provider]  Erenumab-aooe (AIMOVIG) 70 MG/ML SOAJ Inject 1 mL into the skin every 30 (thirty) days.    [provider]  gabapentin (NEURONTIN) 300 MG capsule Take 300 mg by mouth 3 (three) times daily. 02/24/20   [provider]  hydrochlorothiazide (HYDRODIURIL) 25 MG tablet Take 25 mg by mouth daily. 07/28/19   [provider]  losartan (COZAAR) 25 MG tablet Take 50 mg  by mouth daily. 11/04/20   [provider]  meclizine (ANTIVERT) 25 MG tablet meclizine 25 mg tablet  TAKE 1 TABLET BY MOUTH FOUR TIMES DAILY AS NEEDED FOR DIZZINESS    [provider]  metoprolol tartrate (LOPRESSOR) 25 MG tablet Take 1 tablet (25 mg total) by mouth 2 (two) times daily. 04/18/19   Roxan Hockey, MD  mirabegron ER (MYRBETRIQ) 25 MG TB24 tablet Take 1 tablet (25 mg total) by mouth daily. 09/20/20   [name], Candee Furbish, MD  NEXLETOL 180 MG TABS Take 180 mg by mouth daily. Pt taking 2-3 times a week 12/03/19   [provider]  ondansetron (ZOFRAN ODT) 4 MG disintegrating tablet Take 1 tablet (4 mg total) by mouth every 8 (eight) hours as needed for nausea or vomiting. 06/08/20   Avegno, Darrelyn Hillock, FNP  pantoprazole (PROTONIX) 40 MG tablet TAKE 1 TABLET BY MOUTH DAILY 30 MINUTES BEFORE BREAKFAST Patient taking differently: Take 40 mg by mouth See admin instructions. Take 1 tablet by mouth daily 30 minutes before breakfast 05/11/20   Annitta Needs, NP  polyethylene glycol powder (GLYCOLAX/MIRALAX) 17 GM/SCOOP powder Take 0.5 Containers by mouth daily.    [provider]  potassium chloride SA (KLOR-CON) 20 MEQ tablet Take 20 mEq by mouth 2 (two) times daily. 12/09/19   [provider]  Probiotic Product (PROBIOTIC-10 PO) Take 1 capsule by mouth daily.    [provider]  tadalafil (CIALIS) 20 MG tablet Take 1 tablet (20 mg total)  by mouth daily as needed. 09/20/20   [name], Candee Furbish, MD  traMADol (ULTRAM) 50 MG tablet Take 50 mg by mouth every 6 (six) hours as needed for moderate pain.    [provider]  Wheat Dextrin (BENEFIBER DRINK MIX PO) Take 1 oz by mouth daily.    [provider]  zinc gluconate 50 MG tablet Take 50 mg by mouth daily.     [provider]    Allergies    Latex  Review of Systems   Review of Systems  Constitutional:  Negative for fever.  HENT:  Negative for sore throat.   Eyes:  Negative for visual disturbance.  Respiratory:  Negative for shortness of breath.   Cardiovascular:  Positive for palpitations and near-syncope. Negative for chest pain.  Gastrointestinal:  Positive for nausea. Negative for abdominal pain.  Genitourinary:  Negative for dysuria.  Musculoskeletal:  Positive for arthralgias.  Skin:  Positive for rash.  Neurological:  Positive for dizziness, light-headedness and headaches.   Physical Exam Updated Vital Signs BP (!) 146/82 (BP Location: Left Arm)   Pulse 67   Temp 98.6 F (37 C) (Oral)   Resp 16   Ht '5\' 6"'$  (1.676 m)   Wt 93.4 kg   SpO2 99%   BMI 33.25 kg/m   Physical Exam Vitals and nursing note reviewed.  Constitutional:      Appearance: Normal appearance. He is well-developed.  HENT:     Head: Normocephalic and atraumatic.  Eyes:     Conjunctiva/sclera: Conjunctivae normal.  Cardiovascular:     Rate and Rhythm: Normal rate and regular rhythm.     Heart sounds: No murmur heard. Pulmonary:     Effort: Pulmonary effort is normal. No respiratory distress.     Breath sounds: Normal breath sounds.  Abdominal:     Palpations: Abdomen is soft.     Tenderness: There is no abdominal tenderness.  Musculoskeletal:        General: No  deformity or signs of injury. Normal range of motion.     Cervical back: Neck supple.  Skin:    General: Skin is warm and dry.  Neurological:     General: No focal deficit present.     Mental  Status: He is alert.    ED Results / Procedures / Treatments   Labs (all labs ordered are listed, but only abnormal results are displayed) Labs Reviewed  COMPREHENSIVE METABOLIC PANEL - Abnormal; Notable for the following components:      Result Value   Potassium 3.4 (*)    Glucose, Bld 105 (*)    All other components within normal limits  CBC WITH DIFFERENTIAL/PLATELET    EKG EKG Interpretation  Date/Time:  Thursday March 03 2021 21:26:10 EDT Ventricular Rate:  63 PR Interval:  181 QRS Duration: 103 QT Interval:  413 QTC Calculation: 427 R Axis:   19 Text Interpretation: Sinus rhythm Minimal ST elevation, anterior leads No significant change since prior 7/22 Confirmed by Aletta Edouard [phone]) on 03/03/2021 9:28:25 PM  Radiology No results found.  Procedures Procedures   Medications Ordered in ED Medications  sodium chloride 0.9 % bolus 500 mL (has no administration in time range)    ED Course  I have reviewed the triage vital signs and the nursing notes.  Pertinent labs & imaging results that were available during my care of the patient were reviewed by me and considered in my medical decision making (see chart for details).  Clinical Course as of 03/04/21 1051  Thu Mar 03, 2021  2128 Patient very frustrated that we cannot find a cause of his symptoms.  Recommended that he follow-up with his neurologist primary care doctor and his cardiologist but he says they do not help him. [MB]    Clinical Course User Index [MB] Hayden Rasmussen, MD   MDM Rules/Calculators/A&P                           This patient complains of headache dizzy lightheaded nausea heat intolerance; this involves an extensive number of treatment Options and is a complaint that carries with it a high risk of complications and Morbidity. The differential includes headache, migraine, IBS, ACS, pneumonia, metabolic derangement, anemia  I ordered, reviewed and interpreted labs, which included  CBC with normal white count normal hemoglobin, chemistries normal other than mildly low potassium, LFTs normal I ordered medication IV fluids Additional history obtained from patient's wife Previous records obtained and reviewed in epic.  Patient has multiple ED visits for similar presentations.  Also with seeing cardiology and neurology for similar complaints.  After the interventions stated above, I reevaluated the patient and found patient to be hemodynamically stable and nontoxic-appearing.  Reviewed work-up with him.  Recommended close follow-up with his treating providers.  Return instructions discussed  Final Clinical Impression(s) / ED Diagnoses Final diagnoses:  Dizziness  Nausea  Near syncope  Generalized headache  Intolerance to heat    Rx / DC Orders ED Discharge Orders     None        Hayden Rasmussen, MD 03/04/21 1054

## 2021-03-05 ENCOUNTER — Other Ambulatory Visit: Payer: Self-pay

## 2021-03-05 ENCOUNTER — Emergency Department (HOSPITAL_COMMUNITY)
Admission: EM | Admit: 2021-03-05 | Discharge: 2021-03-05 | Disposition: A | Discharge status: Home / Self Care | Payer: Medicare HMO | Attending: Emergency Medicine | Admitting: Emergency Medicine

## 2021-03-05 ENCOUNTER — Encounter (HOSPITAL_COMMUNITY): Payer: Self-pay | Admitting: Emergency Medicine

## 2021-03-05 DIAGNOSIS — G43019 Migraine without aura, intractable, without status migrainosus: Secondary | ICD-10-CM | POA: Diagnosis not present

## 2021-03-05 DIAGNOSIS — R110 Nausea: Secondary | ICD-10-CM | POA: Diagnosis present

## 2021-03-05 DIAGNOSIS — Z20822 Contact with and (suspected) exposure to covid-19: Secondary | ICD-10-CM | POA: Insufficient documentation

## 2021-03-05 DIAGNOSIS — I10 Essential (primary) hypertension: Secondary | ICD-10-CM | POA: Diagnosis not present

## 2021-03-05 DIAGNOSIS — E039 Hypothyroidism, unspecified: Secondary | ICD-10-CM | POA: Diagnosis not present

## 2021-03-05 DIAGNOSIS — Z720 Tobacco use: Secondary | ICD-10-CM | POA: Diagnosis not present

## 2021-03-05 DIAGNOSIS — Z8546 Personal history of malignant neoplasm of prostate: Secondary | ICD-10-CM | POA: Diagnosis not present

## 2021-03-05 LAB — COMPREHENSIVE METABOLIC PANEL
ALT: 17 U/L (ref 0–44)
AST: 18 U/L (ref 15–41)
Albumin: 4.4 g/dL (ref 3.5–5.0)
Alkaline Phosphatase: 54 U/L (ref 38–126)
Anion gap: 5 (ref 5–15)
BUN: 7 mg/dL — ABNORMAL LOW (ref 8–23)
CO2: 25 mmol/L (ref 22–32)
Calcium: 9.5 mg/dL (ref 8.9–10.3)
Chloride: 103 mmol/L (ref 98–111)
Creatinine, Ser: 0.78 mg/dL (ref 0.61–1.24)
GFR, Estimated: >60 mL/min (ref >60)
Glucose, Bld: 105 mg/dL — ABNORMAL HIGH (ref 70–99)
Potassium: 3.4 mmol/L — ABNORMAL LOW (ref 3.5–5.1)
Sodium: 133 mmol/L — ABNORMAL LOW (ref 135–145)
Total Bilirubin: 1 mg/dL (ref 0.3–1.2)
Total Protein: 7.9 g/dL (ref 6.5–8.1)

## 2021-03-05 LAB — CBC WITH DIFFERENTIAL/PLATELET
Abs Immature Granulocytes: 0.01 10*3/uL (ref 0.00–0.07)
Basophils Absolute: 0 10*3/uL (ref 0.0–0.1)
Basophils Relative: 0 %
Eosinophils Absolute: 0.1 10*3/uL (ref 0.0–0.5)
Eosinophils Relative: 1 %
HCT: 44.2 % (ref 39.0–52.0)
Hemoglobin: 15.3 g/dL (ref 13.0–17.0)
Immature Granulocytes: 0 %
Lymphocytes Relative: 31 %
Lymphs Abs: 1.7 10*3/uL (ref 0.7–4.0)
MCH: 30.6 pg (ref 26.0–34.0)
MCHC: 34.6 g/dL (ref 30.0–36.0)
MCV: 88.4 fL (ref 80.0–100.0)
Monocytes Absolute: 0.4 10*3/uL (ref 0.1–1.0)
Monocytes Relative: 8 %
Neutro Abs: 3.3 10*3/uL (ref 1.7–7.7)
Neutrophils Relative %: 60 %
Platelets: 202 10*3/uL (ref 150–400)
RBC: 5 MIL/uL (ref 4.22–5.81)
RDW: 12.3 % (ref 11.5–15.5)
WBC: 5.4 10*3/uL (ref 4.0–10.5)
nRBC: 0 % (ref 0.0–0.2)

## 2021-03-05 MED ORDER — MAGNESIUM SULFATE 2 GM/50ML IV SOLN
2.0000 g | Freq: Once | INTRAVENOUS | Status: AC
  Administered 2021-03-05: 2 g via INTRAVENOUS
  Filled 2021-03-05: qty 50

## 2021-03-05 MED ORDER — SUMATRIPTAN SUCCINATE 50 MG PO TABS: 50.0000 mg | ORAL_TABLET | ORAL | 0 refills | Status: DC | PRN

## 2021-03-05 MED ORDER — DEXAMETHASONE SODIUM PHOSPHATE 10 MG/ML IJ SOLN
10.0000 mg | Freq: Once | INTRAMUSCULAR | Status: AC
  Administered 2021-03-05: 10 mg via INTRAVENOUS
  Filled 2021-03-05: qty 1

## 2021-03-05 MED ORDER — KETOROLAC TROMETHAMINE 30 MG/ML IJ SOLN
30.0000 mg | Freq: Once | INTRAMUSCULAR | Status: AC
  Administered 2021-03-05: 30 mg via INTRAVENOUS
  Filled 2021-03-05: qty 1

## 2021-03-05 MED ORDER — SODIUM CHLORIDE 0.9 % IV BOLUS
1000.0000 mL | Freq: Once | INTRAVENOUS | Status: AC
  Administered 2021-03-05: 1000 mL via INTRAVENOUS

## 2021-03-05 MED ORDER — PROCHLORPERAZINE EDISYLATE 10 MG/2ML IJ SOLN
10.0000 mg | Freq: Once | INTRAMUSCULAR | Status: AC
  Administered 2021-03-05: 10 mg via INTRAVENOUS
  Filled 2021-03-05: qty 2

## 2021-03-05 NOTE — Discharge Instructions (Addendum)
Your lab work today was reassuring. You were given IV fluids, magnesium sulfate, steroids, anti-inflammatories, and anti-nausea medications for your symptoms. Please follow up with your neurologist for migraine medication management. Consider keeping a journal of your symptoms to present to your neurologist. As we discussed previously, continue your current medication regimen for anxiety as prescribed.

## 2021-03-05 NOTE — ED Triage Notes (Signed)
Pt c/o nausea, weakness and dizziness x 1 week. Pt seen on 8/11 for same.

## 2021-03-05 NOTE — ED Provider Notes (Signed)
[facility] Provider Note   CSN: [id number] Arrival date & time: 03/05/21  1703     History Chief Complaint  Patient presents with   Weakness    Steven [name] is a 70 y.o. male.  Steven [name] is a 70 y/o male with PMH of atrial fibrillation who presents to ED with nausea, weakness, and dizziness for several weeks. Patient complains of 4 episodes of diarrhea today. No blood in stool. Has been feeling feverish but has not checked his temperature. He is trying to increase his fluid intake. Patient reports mild chest tightness that comes and goes, no aggravating or alleviating factors. Denies syncope, vomiting, change in appetite, abdominal pain, dysuria, increased frequency or urgency of urination or defecation. Patient was seen on 8/11 for same, workup unremarkable at that time. Patient sees neurology for migraines and seizures and cardiology for atrial fibrillation, and has struggled with similar symptoms of dizziness for over a year. Patient just started taking Zoloft for anxiety.   The history is provided by the patient.  Weakness Associated symptoms: diarrhea, dizziness, headaches and nausea   Associated symptoms: no abdominal pain, no chest pain, no cough, no dysuria, no fever, no shortness of breath and no vomiting       Past Medical History:  Diagnosis Date   Arthritis    Atrial fibrillation (HCC)    Atrial flutter (HCC)    BPH (benign prostatic hyperplasia)    Chest pain    Dizziness    Dyspnea    laying down occ   Fracture 08/17/2015   MULTIPLE RIB FRACTURES     FROM FALL    GERD (gastroesophageal reflux disease)    Hemorrhoids    History of kidney stones    noted on CT scan   History of radiation therapy 08/22/11-10/13/11   prostate   Hyperlipemia    Hypertension    Hyperthyroidism    Light headedness    Numbness and tingling in left arm    Numbness and tingling of both legs    Open fracture of left elbow 08/18/2015   Prostate cancer  [facility])    prostate s/p radiation Mar 2013   Rib fractures 08/17/2015    Patient Active Problem List   Diagnosis Date Noted   Chronic pain 07/07/2020   Encounter to establish care 07/07/2020   Anxiety 07/07/2020   Carcinoma of prostate ([place]) 02/17/2020   Subacute thyroiditis 01/07/2020   Personal history of malignant neoplasm of prostate 12/12/2019   Benign prostatic hyperplasia with urinary obstruction 12/12/2019   Weak urinary stream 12/12/2019   Hyperthyroidism 11/24/2019   Weight loss 10/21/2019   IBS (irritable bowel syndrome) 10/21/2019   Dizzy spells 09/20/2019   Intractable chronic migraine without aura 09/20/2019   Obstructive sleep apnea syndrome 09/20/2019   Psychophysiologic insomnia 09/20/2019   Rectal pain 08/21/2019   Nausea without vomiting 05/30/2019   Atrial flutter ([place]) 04/17/2019   Chronic atrial fibrillation ([place]) 03/02/2019   HTN (hypertension) 03/02/2019   Constipation 08/02/2017   Hemorrhoids 08/02/2017   Fall 08/18/2015   Radiation proctitis 01/30/2013   Rectal bleeding 12/10/2012   Obese 05/30/2012   Malignant neoplasm of prostate ([place]) 08/13/2011    Past Surgical History:  Procedure Laterality Date   BOTOX INJECTION N/A 01/14/2020   Procedure: INJECTION OF BOTOX INTO ANAL SPHINCTER;  Surgeon: Ileana Roup, MD;  Location: [facility];  Service: General;  Laterality: N/A;   COLONOSCOPY N/A 12/19/2012   [id number] bleeding secondary to radiation  induced proctitis  - status post APC ablation; internal hemorrhoids. Normal appearing colon   COLONOSCOPY WITH PROPOFOL N/A 10/23/2019   Procedure: COLONOSCOPY WITH PROPOFOL;  Surgeon: Daneil Dolin, MD; external and grade 2 internal hemorrhoids, abnormal rectal blood vessels consistent with radiation proctitis s/p APC therapy, otherwise normal exam.   EVALUATION UNDER ANESTHESIA WITH ANAL FISTULECTOMY N/A 01/14/2020   Procedure: ANORECTAL EXAM UNDER ANESTHESIA;  Surgeon: Ileana Roup, MD;  Location:  [facility];  Service: General;  Laterality: N/A;   HOT HEMOSTASIS  10/23/2019   Procedure: HOT HEMOSTASIS (ARGON PLASMA COAGULATION/BICAP);  Surgeon: Daneil Dolin, MD;  [facility];  Service: Endoscopy;;  apc rectal proctitis     KIDNEY SURGERY  1982   kidney tube collapse repair   RADIOACTIVE SEED IMPLANT     Prostate       Family History  Problem Relation Age of Onset   Aneurysm Mother    Hypertension Mother    Prostate cancer Father    Hypertension Father    Colon cancer Neg Hx    Colon polyps Neg Hx     Social History   Tobacco Use   Smoking status: Former    Packs/day: 1.00    Years: 20.00    Pack years: 20.00    Types: Cigarettes    Quit date: 2020    Years since quitting: 2.6   Smokeless tobacco: Former   Tobacco comments:    Quit smoking x 2 years    07/2015   SOMETIIMES i USE VAPOR   Vaping Use   Vaping Use: Never used  Substance Use Topics   Alcohol use: Never   Drug use: Never    Home Medications Prior to Admission medications   Medication Sig Start Date End Date Taking? Authorizing Provider  alfuzosin (UROXATRAL) 10 MG 24 hr tablet Take 1 tablet (10 mg total) by mouth daily with breakfast. 03/31/20  Yes [name], Candee Furbish, MD  amitriptyline (ELAVIL) 10 MG tablet Take 10 mg by mouth at bedtime as needed for sleep. 07/07/20  Yes [provider]  amLODipine (NORVASC) 10 MG tablet Take 1 tablet by mouth daily. 12/08/20  Yes [provider]  apixaban (ELIQUIS) 5 MG TABS tablet Take 1 tablet (5 mg total) by mouth 2 (two) times daily. 08/24/20  Yes Josue Hector, MD  Ascorbic Acid (VITAMIN C) 1000 MG tablet Take 1,000 mg by mouth daily.   Yes [provider]  Cholecalciferol (VITAMIN D3) 125 MCG (5000 UT) CAPS Take 1 capsule by mouth daily.    Yes [provider]  clotrimazole-betamethasone (LOTRISONE) cream Apply 1 application topically 2 (two) times daily. 02/17/21  Yes [provider]  Cyanocobalamin (B-12  PO) Take 1 tablet by mouth daily.    Yes [provider]  diazepam (VALIUM) 5 MG tablet Take 5 mg by mouth daily as needed for anxiety.   Yes [provider]  Erenumab-aooe (AIMOVIG) 70 MG/ML SOAJ Inject 1 mL into the skin every 30 (thirty) days.   Yes [provider]  gabapentin (NEURONTIN) 300 MG capsule Take 300 mg by mouth 3 (three) times daily. 02/24/20  Yes [provider]  hydrochlorothiazide (HYDRODIURIL) 25 MG tablet Take 25 mg by mouth daily. 07/28/19  Yes [provider]  losartan (COZAAR) 100 MG tablet Take 100 mg by mouth daily. 01/05/21  Yes [provider]  metoprolol tartrate (LOPRESSOR) 25 MG tablet Take 1 tablet (25 mg total) by mouth 2 (two) times  daily. 04/18/19  Yes Emokpae, Courage, MD  minocycline (MINOCIN) 100 MG capsule Take 100 mg by mouth daily. 03/03/21  Yes [provider]  NEXLETOL 180 MG TABS Take 180 mg by mouth daily. Pt taking 2-3 times a week 12/03/19  Yes [provider]  ondansetron (ZOFRAN ODT) 4 MG disintegrating tablet Take 1 tablet (4 mg total) by mouth every 8 (eight) hours as needed for nausea or vomiting. 06/08/20  Yes Avegno, Darrelyn Hillock, FNP  pantoprazole (PROTONIX) 40 MG tablet TAKE 1 TABLET BY MOUTH DAILY 30 MINUTES BEFORE BREAKFAST Patient taking differently: Take 40 mg by mouth See admin instructions. Take 1 tablet by mouth daily 30 minutes before breakfast 05/11/20  Yes Annitta Needs, NP  polyethylene glycol powder (GLYCOLAX/MIRALAX) 17 GM/SCOOP powder Take 0.5 Containers by mouth daily.   Yes [provider]  potassium chloride SA (KLOR-CON) 20 MEQ tablet Take 20 mEq by mouth 2 (two) times daily. 12/09/19  Yes [provider]  Probiotic Product (PROBIOTIC-10 PO) Take 1 capsule by mouth daily.   Yes [provider]  sertraline (ZOLOFT) 25 MG tablet Take 25 mg by mouth daily. 03/03/21  Yes [provider]  SUMAtriptan (IMITREX) 50 MG tablet Take 1 tablet  (50 mg total) by mouth every 2 (two) hours as needed for migraine. May repeat in 2 hours if headache persists or recurs. 03/05/21  Yes Bricelyn Freestone T, PA-C  tadalafil (CIALIS) 20 MG tablet Take 1 tablet (20 mg total) by mouth daily as needed. Patient taking differently: Take 20 mg by mouth daily as needed for erectile dysfunction. 09/20/20  Yes [name], Candee Furbish, MD  traMADol (ULTRAM) 50 MG tablet Take 50 mg by mouth every 6 (six) hours as needed for moderate pain.   Yes [provider]  Wheat Dextrin (BENEFIBER DRINK MIX PO) Take 1 oz by mouth daily.   Yes [provider]  mirabegron ER (MYRBETRIQ) 25 MG TB24 tablet Take 1 tablet (25 mg total) by mouth daily. Patient not taking: Reported on 03/05/2021 09/20/20   Cleon Gustin, MD    Allergies    Latex  Review of Systems   Review of Systems  Constitutional:  Positive for chills. Negative for fever.  HENT:  Negative for congestion.   Eyes:  Positive for photophobia.  Respiratory:  Negative for cough and shortness of breath.   Cardiovascular:  Negative for chest pain and palpitations.  Gastrointestinal:  Positive for constipation, diarrhea and nausea. Negative for abdominal pain and vomiting.  Endocrine: Positive for heat intolerance.  Genitourinary:  Negative for dysuria and hematuria.  Skin:  Negative for color change and rash.  Neurological:  Positive for dizziness, weakness, light-headedness and headaches. Negative for syncope.  All other systems reviewed and are negative.  Physical Exam Updated Vital Signs BP (!) 163/92 (BP Location: Left Arm)   Pulse 71   Temp 97.6 F (36.4 C) (Oral)   Resp 20   Ht '5\' 6"'$  (1.676 m)   Wt 93.4 kg   SpO2 98%   BMI 33.25 kg/m   Physical Exam Vitals and nursing note reviewed.  Constitutional:      Appearance: He is well-developed.     Comments: Patient continues to cover his eyes and appears uncomfortable sitting in exam chair.  HENT:     Head: Normocephalic and  atraumatic.     Mouth/Throat:     Mouth: Mucous membranes are moist.  Eyes:     Conjunctiva/sclera: Conjunctivae normal.  Cardiovascular:  Rate and Rhythm: Normal rate and regular rhythm.  Pulmonary:     Effort: Pulmonary effort is normal. No respiratory distress.     Breath sounds: Normal breath sounds.  Abdominal:     General: There is no distension.     Palpations: Abdomen is soft.     Tenderness: There is no abdominal tenderness.  Skin:    General: Skin is warm and dry.  Neurological:     General: No focal deficit present.     Mental Status: He is alert and oriented to person, place, and time.    ED Results / Procedures / Treatments   Labs (all labs ordered are listed, but only abnormal results are displayed) Labs Reviewed  COMPREHENSIVE METABOLIC PANEL - Abnormal; Notable for the following components:      Result Value   Sodium 133 (*)    Potassium 3.4 (*)    Glucose, Bld 105 (*)    BUN 7 (*)    All other components within normal limits  SARS CORONAVIRUS 2 (TAT 6-24 HRS)  CBC WITH DIFFERENTIAL/PLATELET    EKG EKG Interpretation  Date/Time:  Saturday March 05 2021 18:58:41 EDT Ventricular Rate:  63 PR Interval:  164 QRS Duration: 87 QT Interval:  400 QTC Calculation: 410 R Axis:   -15 Text Interpretation: Sinus rhythm Borderline left axis deviation Confirmed by Noemi Chapel [phone]) on 03/05/2021 7:14:41 PM  Radiology No results found.  Procedures Procedures   Medications Ordered in ED Medications  sodium chloride 0.9 % bolus 1,000 mL (0 mLs Intravenous Stopped 03/05/21 2007)  dexamethasone (DECADRON) injection 10 mg (10 mg Intravenous Given 03/05/21 1918)  prochlorperazine (COMPAZINE) injection 10 mg (10 mg Intravenous Given 03/05/21 1918)  ketorolac (TORADOL) 30 MG/ML injection 30 mg (30 mg Intravenous Given 03/05/21 1918)  magnesium sulfate IVPB 2 g 50 mL (0 g Intravenous Stopped 03/05/21 2006)    ED Course  I have reviewed the triage vital signs  and the nursing notes.  Pertinent labs & imaging results that were available during my care of the patient were reviewed by me and considered in my medical decision making (see chart for details).    MDM Rules/Calculators/A&P                           Patient is 70 y/o male who presents with 2 weeks of nausea, weakness, and dizziness. Complaining of feeling feverish, alternating constipation and diarrhea. Denies vomiting, abdominal pain, difficulty breathing, LOC, seizures. Differential to include atrial fibrillation, dehydration, migraine, IBS, anemia, anxiety. Lab work to evaluate hydration status and potential metabolic derangement following several episodes of diarrhea. Preliminary labs unremarkable. EKG showed sinus rhythm. Patient most likely experiencing chronic intractable migraines. Patient given fluids, steroids, Toradol, Compazine and Magnesium sulfate for migraine with mild relief. Patient requested COVID-19 test, results pending. Due to patient's reassuring lab work and unremarkable physical exam, I believe patient does not require admission for inpatient treatment at this time. Patient would benefit from following up with his neurologist to adjust his migraine medication regimen. Patient should continue taking medication for his anxiety.   Final Clinical Impression(s) / ED Diagnoses Final diagnoses:  Migraine without aura, intractable    Rx / DC Orders ED Discharge Orders          Ordered    SUMAtriptan (IMITREX) 50 MG tablet  Every 2 hours PRN        03/05/21 2242  Estill Cotta 03/05/21 2248    Noemi Chapel, MD 03/06/21 1455

## 2021-03-07 LAB — SARS CORONAVIRUS 2 (TAT 6-24 HRS): SARS Coronavirus 2: NEGATIVE

## 2021-03-08 DIAGNOSIS — G43711 Chronic migraine without aura, intractable, with status migrainosus: Secondary | ICD-10-CM | POA: Diagnosis not present

## 2021-03-08 DIAGNOSIS — R42 Dizziness and giddiness: Secondary | ICD-10-CM | POA: Diagnosis not present

## 2021-03-08 DIAGNOSIS — Z79899 Other long term (current) drug therapy: Secondary | ICD-10-CM | POA: Diagnosis not present

## 2021-03-09 DIAGNOSIS — R42 Dizziness and giddiness: Secondary | ICD-10-CM | POA: Diagnosis not present

## 2021-03-09 DIAGNOSIS — R55 Syncope and collapse: Secondary | ICD-10-CM | POA: Diagnosis not present

## 2021-03-09 DIAGNOSIS — I10 Essential (primary) hypertension: Secondary | ICD-10-CM | POA: Diagnosis not present

## 2021-03-09 DIAGNOSIS — I951 Orthostatic hypotension: Secondary | ICD-10-CM | POA: Diagnosis not present

## 2021-03-09 DIAGNOSIS — I482 Chronic atrial fibrillation, unspecified: Secondary | ICD-10-CM | POA: Diagnosis not present

## 2021-03-09 DIAGNOSIS — G43711 Chronic migraine without aura, intractable, with status migrainosus: Secondary | ICD-10-CM | POA: Diagnosis not present

## 2021-03-11 ENCOUNTER — Telehealth: Payer: Self-pay | Admitting: *Deleted

## 2021-03-11 NOTE — Telephone Encounter (Signed)
Spoke to pt. Informed him of recommendations. Pt informed me that he could not get into PCP for a couple of  weeks. He states that his stomach is hurting and he feels weak. States he has been nauseated but no vomiting. Has been having regular bowel movements. States he is taking Protonix '40mg'$ , Probiotic and Zofran when he is nauseated. Suggested again that if he continues to feel bad he needs to go to ED. Pt voiced understanding.

## 2021-03-11 NOTE — Telephone Encounter (Signed)
Pt called and said he was having really bad headaches. He states he feels dizzy, weak and his heart is racing. States he has been to the ER twice last week. States he has been taking Tylenol but this is not working anymore. He thinks this symptoms are coming from his IBS. Suggested he may need to go back to ER if he still feels his heart is racing and dizziness.

## 2021-03-16 DIAGNOSIS — F419 Anxiety disorder, unspecified: Secondary | ICD-10-CM | POA: Diagnosis not present

## 2021-03-16 DIAGNOSIS — E669 Obesity, unspecified: Secondary | ICD-10-CM | POA: Diagnosis not present

## 2021-03-16 DIAGNOSIS — K08409 Partial loss of teeth, unspecified cause, unspecified class: Secondary | ICD-10-CM | POA: Diagnosis not present

## 2021-03-16 DIAGNOSIS — Z825 Family history of asthma and other chronic lower respiratory diseases: Secondary | ICD-10-CM | POA: Diagnosis not present

## 2021-03-16 DIAGNOSIS — D649 Anemia, unspecified: Secondary | ICD-10-CM | POA: Diagnosis not present

## 2021-03-16 DIAGNOSIS — Z8546 Personal history of malignant neoplasm of prostate: Secondary | ICD-10-CM | POA: Diagnosis not present

## 2021-03-16 DIAGNOSIS — K219 Gastro-esophageal reflux disease without esophagitis: Secondary | ICD-10-CM | POA: Diagnosis not present

## 2021-03-16 DIAGNOSIS — Z8042 Family history of malignant neoplasm of prostate: Secondary | ICD-10-CM | POA: Diagnosis not present

## 2021-03-16 DIAGNOSIS — Z604 Social exclusion and rejection: Secondary | ICD-10-CM | POA: Diagnosis not present

## 2021-03-16 DIAGNOSIS — K5900 Constipation, unspecified: Secondary | ICD-10-CM | POA: Diagnosis not present

## 2021-03-16 DIAGNOSIS — Z8249 Family history of ischemic heart disease and other diseases of the circulatory system: Secondary | ICD-10-CM | POA: Diagnosis not present

## 2021-03-16 DIAGNOSIS — E785 Hyperlipidemia, unspecified: Secondary | ICD-10-CM | POA: Diagnosis not present

## 2021-03-16 DIAGNOSIS — G4700 Insomnia, unspecified: Secondary | ICD-10-CM | POA: Diagnosis not present

## 2021-03-16 DIAGNOSIS — G3184 Mild cognitive impairment, so stated: Secondary | ICD-10-CM | POA: Diagnosis not present

## 2021-03-16 DIAGNOSIS — I4891 Unspecified atrial fibrillation: Secondary | ICD-10-CM | POA: Diagnosis not present

## 2021-03-16 DIAGNOSIS — D6869 Other thrombophilia: Secondary | ICD-10-CM | POA: Diagnosis not present

## 2021-03-16 DIAGNOSIS — Z6830 Body mass index (BMI) 30.0-30.9, adult: Secondary | ICD-10-CM | POA: Diagnosis not present

## 2021-03-16 DIAGNOSIS — Z87891 Personal history of nicotine dependence: Secondary | ICD-10-CM | POA: Diagnosis not present

## 2021-03-16 DIAGNOSIS — G43909 Migraine, unspecified, not intractable, without status migrainosus: Secondary | ICD-10-CM | POA: Diagnosis not present

## 2021-03-16 DIAGNOSIS — N400 Enlarged prostate without lower urinary tract symptoms: Secondary | ICD-10-CM | POA: Diagnosis not present

## 2021-03-16 DIAGNOSIS — I10 Essential (primary) hypertension: Secondary | ICD-10-CM | POA: Diagnosis not present

## 2021-03-16 DIAGNOSIS — N529 Male erectile dysfunction, unspecified: Secondary | ICD-10-CM | POA: Diagnosis not present

## 2021-03-16 DIAGNOSIS — R42 Dizziness and giddiness: Secondary | ICD-10-CM | POA: Diagnosis not present

## 2021-03-23 ENCOUNTER — Ambulatory Visit: Payer: Medicare Other | Admitting: Urology

## 2021-03-25 DIAGNOSIS — E6609 Other obesity due to excess calories: Secondary | ICD-10-CM | POA: Diagnosis not present

## 2021-03-25 DIAGNOSIS — Z6832 Body mass index (BMI) 32.0-32.9, adult: Secondary | ICD-10-CM | POA: Diagnosis not present

## 2021-03-25 DIAGNOSIS — I10 Essential (primary) hypertension: Secondary | ICD-10-CM | POA: Diagnosis not present

## 2021-03-25 DIAGNOSIS — E782 Mixed hyperlipidemia: Secondary | ICD-10-CM | POA: Diagnosis not present

## 2021-03-25 DIAGNOSIS — I4891 Unspecified atrial fibrillation: Secondary | ICD-10-CM | POA: Diagnosis not present

## 2021-03-25 DIAGNOSIS — J302 Other seasonal allergic rhinitis: Secondary | ICD-10-CM | POA: Diagnosis not present

## 2021-03-27 ENCOUNTER — Other Ambulatory Visit: Payer: Self-pay | Admitting: Cardiovascular Disease

## 2021-03-29 NOTE — Telephone Encounter (Signed)
Prescription refill request for Eliquis received. Indication:Afib  Last office visit:02/28/21 ([facility])  Scr: 0.78 (03/05/21)  Age: 70 Weight: 93.4kg   Appropriate dose and refill sent to requested pharmacy.

## 2021-04-06 ENCOUNTER — Other Ambulatory Visit: Payer: Self-pay

## 2021-04-06 ENCOUNTER — Encounter: Payer: Self-pay | Admitting: Urology

## 2021-04-06 ENCOUNTER — Telehealth (INDEPENDENT_AMBULATORY_CARE_PROVIDER_SITE_OTHER): Payer: Medicare HMO | Admitting: Urology

## 2021-04-06 DIAGNOSIS — R3912 Poor urinary stream: Secondary | ICD-10-CM | POA: Diagnosis not present

## 2021-04-06 DIAGNOSIS — N401 Enlarged prostate with lower urinary tract symptoms: Secondary | ICD-10-CM | POA: Diagnosis not present

## 2021-04-06 DIAGNOSIS — N411 Chronic prostatitis: Secondary | ICD-10-CM | POA: Diagnosis not present

## 2021-04-06 DIAGNOSIS — R102 Pelvic and perineal pain: Secondary | ICD-10-CM

## 2021-04-06 DIAGNOSIS — N138 Other obstructive and reflux uropathy: Secondary | ICD-10-CM | POA: Diagnosis not present

## 2021-04-06 MED ORDER — ALFUZOSIN HCL ER 10 MG PO TB24: 10.0000 mg | ORAL_TABLET | Freq: Every day | ORAL | 3 refills | Status: DC

## 2021-04-06 NOTE — Progress Notes (Signed)
04/06/2021 10:35 AM   Steven [name] 1950-09-19 [id number]  Referring provider: Lindell Spar, MD [address] [place],  [place] [address]  Patient location: home Physician location: office I connected with  Steven [name] on 04/06/21 by a video enabled telemedicine application and verified that I am speaking with the correct person using two identifiers.   I discussed the limitations of evaluation and management by telemedicine. The patient expressed understanding and agreed to proceed.    Followup chronic prostatitis   HPI: Steven [name] is a 70yo here for followup for BPH and chronic prostatitis. He is on uroxatral '10mg'$ . He has nocturia 0-2x based on fluid consumption. Urine stream strong. No urinary hesitancy. He denies dysuria or hematuria. He was seen at [facility] for an anal fissure. He is currently on miralax daily. He has intermittent constipation and the constipation has improved since last visit. He drinks 60oz of water daily.  No other complaints today   PMH: Past Medical History:  Diagnosis Date   Arthritis    Atrial fibrillation (HCC)    Atrial flutter (HCC)    BPH (benign prostatic hyperplasia)    Chest pain    Dizziness    Dyspnea    laying down occ   Fracture 08/17/2015   MULTIPLE RIB FRACTURES     FROM FALL    GERD (gastroesophageal reflux disease)    Hemorrhoids    History of kidney stones    noted on CT scan   History of radiation therapy 08/22/11-10/13/11   prostate   Hyperlipemia    Hypertension    Hyperthyroidism    Light headedness    Numbness and tingling in left arm    Numbness and tingling of both legs    Open fracture of left elbow 08/18/2015   Prostate cancer [facility])    prostate s/p radiation Mar 2013   Rib fractures 08/17/2015    Surgical History: Past Surgical History:  Procedure Laterality Date   BOTOX INJECTION N/A 01/14/2020   Procedure: INJECTION OF BOTOX INTO ANAL SPHINCTER;  Surgeon: Ileana Roup, MD;  Location: [facility];   Service: General;  Laterality: N/A;   COLONOSCOPY N/A 12/19/2012   [id number] 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 Normal appearing colon   COLONOSCOPY WITH PROPOFOL N/A 10/23/2019   Procedure: COLONOSCOPY WITH PROPOFOL;  Surgeon: Daneil Dolin, MD; external and grade 2 internal hemorrhoids, abnormal rectal blood vessels consistent with radiation proctitis s/p APC therapy, otherwise normal exam.   EVALUATION UNDER ANESTHESIA WITH ANAL FISTULECTOMY N/A 01/14/2020   Procedure: ANORECTAL EXAM UNDER ANESTHESIA;  Surgeon: Ileana Roup, MD;  Location: [facility];  Service: General;  Laterality: N/A;   HOT HEMOSTASIS  10/23/2019   Procedure: HOT HEMOSTASIS (ARGON PLASMA COAGULATION/BICAP);  Surgeon: Daneil Dolin, MD;  [facility];  Service: Endoscopy;;  apc rectal proctitis     KIDNEY SURGERY  1982   kidney tube collapse repair   RADIOACTIVE SEED IMPLANT     Prostate    Home Medications:  Allergies as of 04/06/2021       Reactions   Latex Rash   Possible reaction to latex gloves per patient        Medication List        Accurate as of April 06, 2021 10:35 AM. If you have any questions, ask your nurse or doctor.          Aimovig 70 MG/ML  Soaj Generic drug: Erenumab-aooe Inject 1 mL into the skin every 30 (thirty) days.   alfuzosin 10 MG 24 hr tablet Commonly known as: UROXATRAL Take 1 tablet (10 mg total) by mouth daily with breakfast.   amitriptyline 10 MG tablet Commonly known as: ELAVIL Take 10 mg by mouth at bedtime as needed for sleep.   amLODipine 10 MG tablet Commonly known as: NORVASC Take 1 tablet by mouth daily.   B-12 PO Take 1 tablet by mouth daily.   BENEFIBER DRINK MIX PO Take 1 oz by mouth daily.   clotrimazole-betamethasone cream Commonly known as: LOTRISONE Apply 1 application topically 2 (two) times daily.   diazepam 5 MG tablet Commonly known as: VALIUM Take 5  mg by mouth daily as needed for anxiety.   Eliquis 5 MG Tabs tablet Generic drug: apixaban TAKE 1 TABLET(5 MG) BY MOUTH TWICE DAILY   gabapentin 300 MG capsule Commonly known as: NEURONTIN Take 300 mg by mouth 3 (three) times daily.   hydrochlorothiazide 25 MG tablet Commonly known as: HYDRODIURIL Take 25 mg by mouth daily.   losartan 100 MG tablet Commonly known as: COZAAR Take 100 mg by mouth daily.   metoprolol tartrate 25 MG tablet Commonly known as: LOPRESSOR Take 1 tablet (25 mg total) by mouth 2 (two) times daily.   minocycline 100 MG capsule Commonly known as: MINOCIN Take 100 mg by mouth daily.   mirabegron ER 25 MG Tb24 tablet Commonly known as: MYRBETRIQ Take 1 tablet (25 mg total) by mouth daily.   Nexletol 180 MG Tabs Generic drug: Bempedoic Acid Take 180 mg by mouth daily. Pt taking 2-3 times a week   ondansetron 4 MG disintegrating tablet Commonly known as: Zofran ODT Take 1 tablet (4 mg total) by mouth every 8 (eight) hours as needed for nausea or vomiting.   pantoprazole 40 MG tablet Commonly known as: PROTONIX TAKE 1 TABLET BY MOUTH DAILY 30 MINUTES BEFORE BREAKFAST What changed: See the new instructions.   polyethylene glycol powder 17 GM/SCOOP powder Commonly known as: GLYCOLAX/MIRALAX Take 0.5 Containers by mouth daily.   potassium chloride SA 20 MEQ tablet Commonly known as: KLOR-CON Take 20 mEq by mouth 2 (two) times daily.   PROBIOTIC-10 PO Take 1 capsule by mouth daily.   sertraline 25 MG tablet Commonly known as: ZOLOFT Take 25 mg by mouth daily.   SUMAtriptan 50 MG tablet Commonly known as: Imitrex Take 1 tablet (50 mg total) by mouth every 2 (two) hours as needed for migraine. May repeat in 2 hours if headache persists or recurs.   tadalafil 20 MG tablet Commonly known as: CIALIS Take 1 tablet (20 mg total) by mouth daily as needed. What changed: reasons to take this   traMADol 50 MG tablet Commonly known as: ULTRAM Take  50 mg by mouth every 6 (six) hours as needed for moderate pain.   vitamin C 1000 MG tablet Take 1,000 mg by mouth daily.   Vitamin D3 125 MCG (5000 UT) Caps Take 1 capsule by mouth daily.        Allergies:  Allergies  Allergen Reactions   Latex Rash    Possible reaction to latex gloves per patient    Family History: Family History  Problem Relation Age of Onset   Aneurysm Mother    Hypertension Mother    Prostate cancer Father    Hypertension Father    Colon cancer Neg Hx    Colon polyps Neg Hx     Social  History:  reports that he quit smoking about 2 years ago. His smoking use included cigarettes. He has a 20.00 pack-year smoking history. He has quit using smokeless tobacco. He reports that he does not drink alcohol and does not use drugs.  ROS: All other review of systems were reviewed and are negative except what is noted above in HPI   Laboratory Data: Lab Results  Component Value Date   WBC 5.4 03/05/2021   HGB 15.3 03/05/2021   HCT 44.2 03/05/2021   MCV 88.4 03/05/2021   PLT 202 03/05/2021    Lab Results  Component Value Date   CREATININE 0.78 03/05/2021    Lab Results  Component Value Date   PSA 1.8 12/10/2019   PSA 2.3 11/10/2019    No results found for: TESTOSTERONE  No results found for: HGBA1C  Urinalysis    Component Value Date/Time   COLORURINE COLORLESS (A) 02/17/2021 1112   APPEARANCEUR CLEAR 02/17/2021 1112   APPEARANCEUR Clear 09/20/2020 1623   LABSPEC 1.002 (L) 02/17/2021 1112   PHURINE 7.0 02/17/2021 1112   GLUCOSEU NEGATIVE 02/17/2021 1112   HGBUR NEGATIVE 02/17/2021 1112   BILIRUBINUR NEGATIVE 02/17/2021 1112   BILIRUBINUR Negative 09/20/2020 1623   KETONESUR NEGATIVE 02/17/2021 1112   PROTEINUR NEGATIVE 02/17/2021 1112   UROBILINOGEN negative (A) 12/12/2019 1113   UROBILINOGEN 1.0 08/03/2012 1156   NITRITE NEGATIVE 02/17/2021 1112   LEUKOCYTESUR NEGATIVE 02/17/2021 1112    Lab Results  Component Value Date    LABMICR Comment 09/20/2020   WBCUA None seen 03/02/2020   LABEPIT None seen 03/02/2020   BACTERIA None seen 03/02/2020    Pertinent Imaging:  Results for orders placed during the hospital encounter of 11/07/19  DG Abdomen 1 View  Narrative CLINICAL DATA:  Constipation. Hemorrhoids. Irritable bowel syndrome. Prostate cancer.  EXAM: ABDOMEN - 1 VIEW  COMPARISON:  CT pelvis 09/25/2019 in CT abdomen 04/29/2019  FINDINGS: Oval-shaped densities along the midline of the upper abdomen, possibly in the stomach. The patient had gallstones on the 04/29/2019 exam but these densities appear more regular and more medially located than expected location for gallstones. There is another oval-shaped density projecting over the transverse colon and 1 along the rectum, again I suspect that these are within stomach/bowel.  The tiny punctate right kidney lower pole renal calculus shown on 04/29/2019 is not readily seen on today's conventional radiographs.  Dextroconvex lumbar scoliosis with rotary component. No dilated bowel. Bowel gas pattern appears otherwise unremarkable.  IMPRESSION: 1. Oval-shaped densities in the abdomen are thought to be in the stomach and bowel. 2. No dilated bowel.  Unremarkable bowel gas pattern. 3. Dextroconvex lumbar scoliosis with rotary component.   Electronically Signed By: Van Clines M.D. On: 11/07/2019 17:35  No results found for this or any previous visit.  No results found for this or any previous visit.  No results found for this or any previous visit.  No results found for this or any previous visit.  No results found for this or any previous visit.  No results found for this or any previous visit.  No results found for this or any previous visit.   Assessment & Plan:    1. Benign prostatic hyperplasia with urinary obstruction -Continue uroxatral '10mg'$  qhs  2. Chronic prostatitis -Continue uroxatral '10mg'$  daily  3. Weak  urinary stream -Continue uroxatral '10mg'$  daily  4. Perineal pain -Management per [facility]    No follow-ups on file.  Nicolette Bang, MD  [facility] [place]

## 2021-04-06 NOTE — Patient Instructions (Signed)
Prostatitis Prostatitis is swelling of the prostate gland, also called the prostate. This gland is about 1.5 inches wide and 1 inch high, and it helps to make a fluid called semen. The prostate is below a man's bladder, in front of the butt (rectum). There are different types of prostatitis. What are the causes? One type of prostatitis is caused by an infection from germs (bacteria). Another type is not caused by germs. It may be caused by: Things having to do with the nervous system. This system includes thebrain, spinal cord, and nerves. An autoimmune response. This happens when the body's disease-fighting system attacks healthy tissue in the body by mistake. Psychological factors. These have to do with how the mind works. The causes of other types of prostatitis are normally not known. What are the signs or symptoms? Symptoms of this condition depend on the type of prostatitis you have. If your condition is caused by germs: You may feel pain or burning when you pee (urinate). You may pee often and all of a sudden. You may have problems starting to pee. You may have trouble emptying your bladder when you pee. You may have fever or chills. You may feel pain in your muscles, joints, low back, or lower belly. If you have other types of prostatitis: You may pee often or all of a sudden. You may have trouble starting to pee. You may have a weak flow when you pee. You may leak pee after using the bathroom. You may have other problems, such as: Abnormal fluid coming from the penis. Pain in the testicles or penis. Pain between the butt and the testicles. Pain when fluid comes out of the penis during sex. How is this treated? Treatment for this condition depends on the type of prostatitis. Treatment may include: Medicines. These may treat pain or swelling, or they may help relax muscles. Exercises to help you move better or get stronger (physical therapy). Heat therapy. Techniques to help  you control some of the ways that your body works. Exercises to help you relax. Antibiotic medicine, if your condition is caused by germs. Warm water baths (sitz baths) to relax muscles. Follow these instructions at home: Medicines Take over-the-counter and prescription medicines only as told by your doctor. If you were prescribed an antibiotic medicine, take it as told by your doctor. Do not stop using the antibiotic even if you start to feel better. Managing pain and swelling  Take sitz baths as told by your doctor. For a sitz bath, sit in warm water that is deep enough to cover your hips and butt. If told, put heat on the painful area. Do this as often as told by your doctor. Use the heat source that your doctor recommends, such as a moist heat pack or a heating pad. Place a towel between your skin and the heat source. Leave the heat on for 20-30 minutes. Take off the heat if your skin turns bright red. This is very important if you are unable to feel pain, heat, or cold. You may have a greater risk of getting burned. General instructions Do exercises as told by your doctor, if your doctor prescribed them. Keep all follow-up visits as told by your doctor. This is important. Where to find more information [employer] of Diabetes and Digestive and Kidney Diseases: http://www.bass.com/ Contact a doctor if: Your symptoms get worse. You have a fever. Get help right away if: You have chills. You feel light-headed. You feel like you may faint.  You cannot pee. You have blood or clumps of blood (blood clots) in your pee. Summary Prostatitis is swelling of the prostate gland. There are different types of prostatitis. Treatment depends on the type that you have. Take over-the-counter and prescription medicines only as told by your doctor. Get help right away of you have chills, feel light-headed, or feel like you may faint. Also get help right away if you cannot pee or you have  blood or clumps of blood in your pee. This information is not intended to replace advice given to you by your health care provider. Make sure you discuss any questions you have with your health care provider. Document Revised: 08/15/2019 Document Reviewed: 08/15/2019 Elsevier Patient Education  2022 [employer].

## 2021-04-11 DIAGNOSIS — R55 Syncope and collapse: Secondary | ICD-10-CM | POA: Diagnosis not present

## 2021-04-11 DIAGNOSIS — I10 Essential (primary) hypertension: Secondary | ICD-10-CM | POA: Diagnosis not present

## 2021-04-11 DIAGNOSIS — R42 Dizziness and giddiness: Secondary | ICD-10-CM | POA: Diagnosis not present

## 2021-04-11 DIAGNOSIS — I482 Chronic atrial fibrillation, unspecified: Secondary | ICD-10-CM | POA: Diagnosis not present

## 2021-04-11 DIAGNOSIS — G43711 Chronic migraine without aura, intractable, with status migrainosus: Secondary | ICD-10-CM | POA: Diagnosis not present

## 2021-04-11 DIAGNOSIS — I951 Orthostatic hypotension: Secondary | ICD-10-CM | POA: Diagnosis not present

## 2021-04-11 DIAGNOSIS — F419 Anxiety disorder, unspecified: Secondary | ICD-10-CM | POA: Diagnosis not present

## 2021-04-13 DIAGNOSIS — H9313 Tinnitus, bilateral: Secondary | ICD-10-CM | POA: Diagnosis not present

## 2021-04-13 DIAGNOSIS — R42 Dizziness and giddiness: Secondary | ICD-10-CM | POA: Diagnosis not present

## 2021-04-13 DIAGNOSIS — H903 Sensorineural hearing loss, bilateral: Secondary | ICD-10-CM | POA: Diagnosis not present

## 2021-04-22 DIAGNOSIS — I10 Essential (primary) hypertension: Secondary | ICD-10-CM | POA: Diagnosis not present

## 2021-04-22 DIAGNOSIS — Z6833 Body mass index (BMI) 33.0-33.9, adult: Secondary | ICD-10-CM | POA: Diagnosis not present

## 2021-04-22 DIAGNOSIS — M47812 Spondylosis without myelopathy or radiculopathy, cervical region: Secondary | ICD-10-CM | POA: Diagnosis not present

## 2021-04-25 ENCOUNTER — Other Ambulatory Visit (HOSPITAL_COMMUNITY): Payer: Self-pay | Admitting: Otolaryngology

## 2021-04-25 DIAGNOSIS — H9311 Tinnitus, right ear: Secondary | ICD-10-CM

## 2021-05-06 ENCOUNTER — Telehealth: Payer: Self-pay | Admitting: Cardiovascular Disease

## 2021-05-06 NOTE — Telephone Encounter (Signed)
New message    Pt c/o medication issue:  1. Name of Medication: metoprolol tartrate (LOPRESSOR) 25 MG table  2. How are you currently taking this medication (dosage and times per day)? 1 tablet 2x a day   3. Are you having a reaction (difficulty breathing--STAT)? yes  4. What is your medication issue? Patient believes that this medication is causing him to have ear ringing. He has been to a ENT and they said that any medication that ends in "lol" can result in ear ringing, tinnitis , and loss of hearing?

## 2021-05-06 NOTE — Telephone Encounter (Signed)
I will defer to Dr.Nishan

## 2021-05-09 MED ORDER — CARVEDILOL 3.125 MG PO TABS: 3.1250 mg | ORAL_TABLET | Freq: Two times a day (BID) | ORAL | 3 refills | Status: DC

## 2021-05-09 NOTE — Telephone Encounter (Signed)
Spoke to pt who verbalized understanding and would like to give Coreg 3.125 mg tablets bid a try. Sent to the pharmacy per pt's request. Metoprolol Tartrate 25 mg tablets d/c.

## 2021-05-10 ENCOUNTER — Telehealth: Payer: Self-pay | Admitting: Cardiovascular Disease

## 2021-05-10 DIAGNOSIS — G43711 Chronic migraine without aura, intractable, with status migrainosus: Secondary | ICD-10-CM | POA: Diagnosis not present

## 2021-05-10 NOTE — Telephone Encounter (Signed)
Spoke with pt who states that he has been having ringing in his ears for the last month and feels that it is coming from his beta blocker. Pt reports that he spoke with our office the other day and his beta blocker was changed to Coreg 3.125 mg two times daily. Pt has not yet started taking Coreg but will do so tomorrow. Pt informed that he was starting on a low dose of Coreg. Instructed pt to monitor BP and report if there are any changes.

## 2021-05-10 NOTE — Telephone Encounter (Signed)
Patient called stating that he has more questions regarding the Coreg he was prescribed.

## 2021-05-12 DIAGNOSIS — J302 Other seasonal allergic rhinitis: Secondary | ICD-10-CM | POA: Diagnosis not present

## 2021-05-12 DIAGNOSIS — H9313 Tinnitus, bilateral: Secondary | ICD-10-CM | POA: Diagnosis not present

## 2021-05-12 DIAGNOSIS — I10 Essential (primary) hypertension: Secondary | ICD-10-CM | POA: Diagnosis not present

## 2021-05-12 NOTE — Telephone Encounter (Signed)
Patient called his bp is running higher than normal 145/87 after starting this new medication , he would like someone to call him back ,  just checked again at 1220p 136/90

## 2021-05-12 NOTE — Telephone Encounter (Signed)
Spoke with pt who states that his blood pressure has been elevated over the last 2 days while on coreg. Pt instructed to monitor BP for the next 5 days 2 hours after taking meds and report to office. Please advise.

## 2021-05-13 NOTE — Telephone Encounter (Signed)
Called to report blood pressure from about 30 minutes ago. Says BP 162/88. Advised that he should be checking his blood pressure in the mornings, 1-2 hours after medications, same arm and same cuff. Advised to contact us in 5 days with these readings as he was instructed to do on yesterday by Karen Kays Pinnix LPN. Verbalized understanding of plan.

## 2021-05-13 NOTE — Telephone Encounter (Signed)
Calling to give BP readings   [phone]

## 2021-05-24 ENCOUNTER — Other Ambulatory Visit: Payer: Self-pay | Admitting: Gastroenterology

## 2021-05-24 ENCOUNTER — Ambulatory Visit (HOSPITAL_COMMUNITY): Payer: BC Managed Care – PPO

## 2021-05-24 ENCOUNTER — Telehealth: Payer: Self-pay | Admitting: Cardiovascular Disease

## 2021-05-24 ENCOUNTER — Encounter (HOSPITAL_COMMUNITY): Payer: Self-pay

## 2021-05-24 MED ORDER — CARVEDILOL 6.25 MG PO TABS: 6.2500 mg | ORAL_TABLET | Freq: Two times a day (BID) | ORAL | 3 refills | Status: DC

## 2021-05-24 NOTE — Telephone Encounter (Signed)
Yes, we referred him to [facility] for ongoing rectal pain.  We can follow-up with him for GERD.

## 2021-05-24 NOTE — Telephone Encounter (Signed)
Pt notified and verbalized understanding. Pt had no further questions or concerns at this time.

## 2021-05-24 NOTE — Telephone Encounter (Signed)
Spoke to Dr. Nolon Rod nurse at [facility] who stated that their new system has a habit of changing instructions on medication refills itself. Nurse stated that medication is supposed to be Coreg 6.25 mg tablets BID.

## 2021-05-24 NOTE — Addendum Note (Signed)
Addended by: Barbarann Ehlers A on: 05/24/2021 02:26 PM   Modules accepted: Orders

## 2021-05-24 NOTE — Telephone Encounter (Signed)
Pt c/o medication issue:  1. Name of Medication: Carvedilol 3.125 mg  2. How are you currently taking this medication (dosage and times per day)? 2 times a day  3. Are you having a reaction (difficulty breathing--STAT)? No  4. What is your medication issue? His primary wants him to take 6,25 mg  1 time a day- which one does he need to take?

## 2021-05-24 NOTE — Telephone Encounter (Signed)
Spoke to pt.  Informed him of recommendations. Pt stated he would like to follow up with [place] for further refills.   Routing to [place] for OV

## 2021-05-24 NOTE — Telephone Encounter (Signed)
Patient states he followed up and found out that he should be taking 6.25 mg bid.

## 2021-05-24 NOTE — Telephone Encounter (Signed)
New Message:    Patient said he was told to keep his blood pressure readings for a week and call back with them.  05-16-20-   1:00 - 151/86     144/85-6 minutes later  05-18-21 -     1:00-  149/84      138/99- 4 minutes later 151/85- 4 minutes later   157/86 laying down      2:37-   162/87    5:52 122/71 pulse 70   05-19-21-    3:00   141/84 pulse 69   3:26  133/77 pulse 66    5:21-   126/72 pulse 62   05-20-21-       153/82  pulse 61    5:17 137/80 pulse 61            05-21-21-      9:54   145/83 pulse 68    9:58  137/87 pulse 66    12:00  142/80 pulse 61 500  142/85 pulse 65  5:19 137/81 pulse 65

## 2021-05-24 NOTE — Telephone Encounter (Signed)
Sending in limited supply of Protonix. We have not addressed GERD in quite some time. Patient has been following closely with [facility] for ongoing rectal issues/constipation which was the primary reason we were seeing him previously, but referred him to [facility] due to persistent symptoms.   Couple of options for him:  He can follow-up with [place] for further refills on Protonix.  Discuss with PCP for additional refills.  Or discuss with [place] for further refills.

## 2021-05-25 ENCOUNTER — Encounter: Payer: Self-pay | Admitting: Internal Medicine

## 2021-06-02 ENCOUNTER — Ambulatory Visit (HOSPITAL_COMMUNITY)
Admission: RE | Admit: 2021-06-02 | Discharge: 2021-06-02 | Disposition: A | Discharge status: Home / Self Care | Payer: Medicare HMO | Source: Ambulatory Visit | Attending: Otolaryngology | Admitting: Otolaryngology

## 2021-06-02 ENCOUNTER — Encounter (HOSPITAL_COMMUNITY): Payer: Self-pay | Admitting: Radiology

## 2021-06-02 ENCOUNTER — Other Ambulatory Visit: Payer: Self-pay

## 2021-06-02 DIAGNOSIS — H9311 Tinnitus, right ear: Secondary | ICD-10-CM | POA: Insufficient documentation

## 2021-06-02 MED ORDER — IOHEXOL 350 MG/ML SOLN
75.0000 mL | Freq: Once | INTRAVENOUS | Status: AC | PRN
  Administered 2021-06-02: 75 mL via INTRAVENOUS

## 2021-06-03 ENCOUNTER — Telehealth: Payer: Self-pay | Admitting: Cardiovascular Disease

## 2021-06-03 ENCOUNTER — Telehealth: Payer: Self-pay | Admitting: *Deleted

## 2021-06-03 NOTE — Telephone Encounter (Signed)
Called patient back to let him know that Dr. Johnsie Cancel thinks he needs to follow up with his PCP. Patient was wondering if he could go back to taking metoprolol instead of coreg. Will send message to Dr. Johnsie Cancel to see if okay to go back to metoprolol.

## 2021-06-03 NOTE — Telephone Encounter (Signed)
Pt c/o medication issue:  1. Name of Medication: carvedilol (COREG) 6.25 MG tablet  2. How are you currently taking this medication (dosage and times per day)? Take 1 tablet (6.25 mg total) by mouth 2 (two) times daily.  3. Are you having a reaction (difficulty breathing--STAT)? no  4. What is your medication issue? Patient states he wakes up every morning feeling nauseous, with no appetite, and can't sleep.  He also states he has ringing in his ears.

## 2021-06-03 NOTE — Telephone Encounter (Signed)
Pt. Called this morning stated that he is not feeling well. Still has nausea, but no vomiting. Has regular bowel movements. Has been taking Miralax and today he drank warm prune juice. Stated he has a ringing in his right ear. He is not sure if it his coming from some of the medicine he is taking.

## 2021-06-03 NOTE — Telephone Encounter (Signed)
Called patient back about his message. Patient was switched from Metoprolol to carvedilol due to ringing in the ears and having loss of appetite. Now patient is having still the same symptoms plus nausea. Will forward to Dr. Johnsie Cancel for advisement.

## 2021-06-03 NOTE — Telephone Encounter (Signed)
Spoke to pt. Informed him recommendations. Pt stated he is taking the Protonix daily and using Zofran. But still feels sick. Also has been to his PCP and they sent him to a ear, nose and throat doctor. Thinks the medication is interacting with another medication. Could not tell me what medication they gave him.

## 2021-06-03 NOTE — Telephone Encounter (Signed)
We have not seen patient in over 1 year. He is scheduled for follow-up in March. Recommend he continue Protonix daily, Zofran PRN and discuss his symptoms with primary care. The ringing in his ears is not GI related.

## 2021-06-03 NOTE — Telephone Encounter (Signed)
Noted. I will defer to his PCP on possible medication interactions.

## 2021-06-06 ENCOUNTER — Other Ambulatory Visit: Payer: Self-pay

## 2021-06-06 ENCOUNTER — Ambulatory Visit
Admission: EM | Admit: 2021-06-06 | Discharge: 2021-06-06 | Disposition: A | Discharge status: Home / Self Care | Payer: Medicare HMO

## 2021-06-06 DIAGNOSIS — R110 Nausea: Secondary | ICD-10-CM | POA: Diagnosis not present

## 2021-06-06 DIAGNOSIS — G4700 Insomnia, unspecified: Secondary | ICD-10-CM | POA: Diagnosis not present

## 2021-06-06 LAB — POCT I-STAT CREATININE: Creatinine, Ser: 1.1 mg/dL (ref 0.61–1.24)

## 2021-06-06 NOTE — ED Triage Notes (Signed)
Patient states he has not been able to sleep for 3 days. Changed to different Beta Blockers by doctor and he thought that was what was causing. He had a CT Scan last Thursday and everything came back good. He has experienced nausea and appetite is not very good. He has been prescribed Vallum and he is scared to take all the time due to addiction.

## 2021-06-06 NOTE — Telephone Encounter (Signed)
Called patient back to let him know that Dr. Johnsie Cancel is fine with him switching back to metoprolol. Patient stated he does not want to switch now that, he read that coreg is better of the two. Patient stated he is still having trouble sleeping and is still dealing with insomnia. Will forward to Pharm D to see if they have any recommendations.

## 2021-06-06 NOTE — Discharge Instructions (Signed)
Melatonin - May take 5-10 mg nightly at bedtime

## 2021-06-06 NOTE — Telephone Encounter (Signed)
Informed pt of recommendations. Pt voiced understanding. 

## 2021-06-06 NOTE — Telephone Encounter (Signed)
He could try melatonin max 5 mg.  But should otherwise discuss his issues with his PCP. Avoid TV/phones near bedtime.

## 2021-06-08 DIAGNOSIS — G43711 Chronic migraine without aura, intractable, with status migrainosus: Secondary | ICD-10-CM | POA: Diagnosis not present

## 2021-06-10 NOTE — ED Provider Notes (Signed)
[facility]    CSN: [id number] Arrival date & time: 06/06/21  1606      History   Chief Complaint No chief complaint on file.   HPI Steven [name] is a 70 y.o. male.   Patient presenting today with concern of insomnia x3 days.  States he has slept no more than several hours here and there for the past 3 days.  He states this is an ongoing issue for him that he is spoken to his cardiologist, neurologist and primary care provider about.  He was recently recommended to try melatonin as Valium and Benadryl type medications have not been helping.  He has not tried any other over-the-counter remedies at this time and does not want to continue taking the Valium given to him as he is concerned about addiction potential.  He also states he is oftentimes nauseated when he wakes up in the morning.  Is supposed be taking Protonix but stopped it in case this is causing his insomnia.  He denies vomiting, abdominal pain, diarrhea, constipation, fever, chills.  No new dietary changes.   Past Medical History:  Diagnosis Date   Arthritis    Atrial fibrillation (HCC)    Atrial flutter (HCC)    BPH (benign prostatic hyperplasia)    Chest pain    Dizziness    Dyspnea    laying down occ   Fracture 08/17/2015   MULTIPLE RIB FRACTURES     FROM FALL    GERD (gastroesophageal reflux disease)    Hemorrhoids    History of kidney stones    noted on CT scan   History of radiation therapy 08/22/11-10/13/11   prostate   Hyperlipemia    Hypertension    Hyperthyroidism    Light headedness    Numbness and tingling in left arm    Numbness and tingling of both legs    Open fracture of left elbow 08/18/2015   Prostate cancer [facility])    prostate s/p radiation Mar 2013   Rib fractures 08/17/2015    Patient Active Problem List   Diagnosis Date Noted   Chronic pain 07/07/2020   Encounter to establish care 07/07/2020   Anxiety 07/07/2020   Carcinoma of prostate ([place]) 02/17/2020   Subacute  thyroiditis 01/07/2020   Personal history of malignant neoplasm of prostate 12/12/2019   Benign prostatic hyperplasia with urinary obstruction 12/12/2019   Weak urinary stream 12/12/2019   Hyperthyroidism 11/24/2019   Weight loss 10/21/2019   IBS (irritable bowel syndrome) 10/21/2019   Dizzy spells 09/20/2019   Intractable chronic migraine without aura 09/20/2019   Obstructive sleep apnea syndrome 09/20/2019   Psychophysiologic insomnia 09/20/2019   Rectal pain 08/21/2019   Nausea without vomiting 05/30/2019   Atrial flutter ([place]) 04/17/2019   Chronic atrial fibrillation ([place]) 03/02/2019   HTN (hypertension) 03/02/2019   Constipation 08/02/2017   Hemorrhoids 08/02/2017   Fall 08/18/2015   Radiation proctitis 01/30/2013   Rectal bleeding 12/10/2012   Obese 05/30/2012   Malignant neoplasm of prostate ([place]) 08/13/2011    Past Surgical History:  Procedure Laterality Date   BOTOX INJECTION N/A 01/14/2020   Procedure: INJECTION OF BOTOX INTO ANAL SPHINCTER;  Surgeon: Ileana Roup, MD;  Location: [facility];  Service: General;  Laterality: N/A;   COLONOSCOPY N/A 12/19/2012   QJJ:HERDEY 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 Normal appearing colon   COLONOSCOPY WITH PROPOFOL N/A 10/23/2019   Procedure: COLONOSCOPY WITH PROPOFOL;  Surgeon: Gala Romney,  Cristopher Estimable, MD; external and grade 2 internal hemorrhoids, abnormal rectal blood vessels consistent with radiation proctitis s/p APC therapy, otherwise normal exam.   EVALUATION UNDER ANESTHESIA WITH ANAL FISTULECTOMY N/A 01/14/2020   Procedure: ANORECTAL EXAM UNDER ANESTHESIA;  Surgeon: Ileana Roup, MD;  Location: [facility];  Service: General;  Laterality: N/A;   HOT HEMOSTASIS  10/23/2019   Procedure: HOT HEMOSTASIS (ARGON PLASMA COAGULATION/BICAP);  Surgeon: Daneil Dolin, MD;  Location: [facility];  Service: Endoscopy;;  apc rectal proctitis     KIDNEY SURGERY  1982    kidney tube collapse repair   RADIOACTIVE SEED IMPLANT     Prostate       Home Medications    Prior to Admission medications   Medication Sig Start Date End Date Taking? Authorizing Provider  alfuzosin (UROXATRAL) 10 MG 24 hr tablet Take 1 tablet (10 mg total) by mouth daily with breakfast. 04/06/21   [name], Candee Furbish, MD  amitriptyline (ELAVIL) 10 MG tablet Take 10 mg by mouth at bedtime as needed for sleep. 07/07/20   [provider]  amLODipine (NORVASC) 10 MG tablet Take 1 tablet by mouth daily. 12/08/20   [provider]  Ascorbic Acid (VITAMIN C) 1000 MG tablet Take 1,000 mg by mouth daily.    [provider]  carvedilol (COREG) 6.25 MG tablet Take 1 tablet (6.25 mg total) by mouth 2 (two) times daily. 05/24/21   Josue Hector, MD  Cholecalciferol (VITAMIN D3) 125 MCG (5000 UT) CAPS Take 1 capsule by mouth daily.     [provider]  clotrimazole-betamethasone (LOTRISONE) cream Apply 1 application topically 2 (two) times daily. 02/17/21   [provider]  Cyanocobalamin (B-12 PO) Take 1 tablet by mouth daily.     [provider]  diazepam (VALIUM) 5 MG tablet Take 5 mg by mouth daily as needed for anxiety.    [provider]  ELIQUIS 5 MG TABS tablet TAKE 1 TABLET(5 MG) BY MOUTH TWICE DAILY 03/29/21   Josue Hector, MD  Erenumab-aooe (AIMOVIG) 70 MG/ML SOAJ Inject 1 mL into the skin every 30 (thirty) days.    [provider]  gabapentin (NEURONTIN) 300 MG capsule Take 300 mg by mouth 3 (three) times daily. 02/24/20   [provider]  hydrochlorothiazide (HYDRODIURIL) 25 MG tablet Take 25 mg by mouth daily. 07/28/19   [provider]  losartan (COZAAR) 100 MG tablet Take 100 mg by mouth daily. 01/05/21   [provider]  minocycline (MINOCIN) 100 MG capsule Take 100 mg by mouth daily. 03/03/21   [provider]  NEXLETOL 180 MG TABS Take 180 mg by mouth daily. Pt taking 2-3 times a  week 12/03/19   [provider]  ondansetron (ZOFRAN ODT) 4 MG disintegrating tablet Take 1 tablet (4 mg total) by mouth every 8 (eight) hours as needed for nausea or vomiting. 06/08/20   Avegno, Darrelyn Hillock, FNP  pantoprazole (PROTONIX) 40 MG tablet TAKE 1 TABLET BY MOUTH DAILY 30 MINUTES BEFORE BREAKFAST 05/24/21   [name] S, PA-C  polyethylene glycol powder (GLYCOLAX/MIRALAX) 17 GM/SCOOP powder Take 0.5 Containers by mouth daily.    [provider]  potassium chloride SA (KLOR-CON) 20 MEQ tablet Take 20 mEq by mouth 2 (two) times daily. 12/09/19   [provider]  Probiotic Product (PROBIOTIC-10 PO) Take 1 capsule by mouth daily.    [provider]  sertraline (ZOLOFT) 25 MG tablet Take 25 mg by mouth daily.  03/03/21   [provider]  SUMAtriptan (IMITREX) 50 MG tablet Take 1 tablet (50 mg total) by mouth every 2 (two) hours as needed for migraine. May repeat in 2 hours if headache persists or recurs. 03/05/21   Roemhildt, Lorin T, PA-C  tadalafil (CIALIS) 20 MG tablet Take 1 tablet (20 mg total) by mouth daily as needed. Patient taking differently: Take 20 mg by mouth daily as needed for erectile dysfunction. 09/20/20   [name], Candee Furbish, MD  traMADol (ULTRAM) 50 MG tablet Take 50 mg by mouth every 6 (six) hours as needed for moderate pain.    [provider]  Wheat Dextrin (BENEFIBER DRINK MIX PO) Take 1 oz by mouth daily.    [provider]    Family History Family History  Problem Relation Age of Onset   Aneurysm Mother    Hypertension Mother    Prostate cancer Father    Hypertension Father    Colon cancer Neg Hx    Colon polyps Neg Hx     Social History Social History   Tobacco Use   Smoking status: Former    Packs/day: 1.00    Years: 20.00    Pack years: 20.00    Types: Cigarettes    Quit date: 2020    Years since quitting: 2.8   Smokeless tobacco: Former   Tobacco comments:    Quit smoking x 2 years     07/2015   SOMETIIMES i USE VAPOR   Vaping Use   Vaping Use: Never used  Substance Use Topics   Alcohol use: Never   Drug use: Never     Allergies   Latex   Review of Systems Review of Systems Per HPI  Physical Exam Triage Vital Signs ED Triage Vitals  Enc Vitals Group     BP 06/06/21 1908 (!) 142/85     Pulse Rate 06/06/21 1908 (!) 58     Resp 06/06/21 1908 18     Temp 06/06/21 1908 97.9 F (36.6 C)     Temp Source 06/06/21 1908 Oral     SpO2 --      Weight --      Height --      Head Circumference --      Peak Flow --      Pain Score 06/06/21 1900 0     Pain Loc --      Pain Edu? --      Excl. in [place]? --    No data found.  Updated Vital Signs BP (!) 142/85 (BP Location: Right Arm)   Pulse (!) 58   Temp 97.9 F (36.6 C) (Oral)   Resp 18   Visual Acuity Right Eye Distance:   Left Eye Distance:   Bilateral Distance:    Right Eye Near:   Left Eye Near:    Bilateral Near:     Physical Exam Vitals and nursing note reviewed.  Constitutional:      Appearance: Normal appearance.  HENT:     Head: Atraumatic.     Mouth/Throat:     Mouth: Mucous membranes are moist.  Eyes:     Extraocular Movements: Extraocular movements intact.     Conjunctiva/sclera: Conjunctivae normal.  Cardiovascular:     Rate and Rhythm: Normal rate and regular rhythm.     Heart sounds: Normal heart sounds.  Pulmonary:     Effort: Pulmonary effort is normal. No respiratory distress.     Breath sounds: Normal breath sounds. No  wheezing.  Musculoskeletal:        General: Normal range of motion.     Cervical back: Normal range of motion and neck supple.  Skin:    General: Skin is warm and dry.  Neurological:     Mental Status: He is oriented to person, place, and time. Mental status is at baseline.     Motor: No weakness.     Gait: Gait normal.  Psychiatric:        Mood and Affect: Mood normal.        Thought Content: Thought content normal.        Judgment: Judgment normal.      UC Treatments / Results  Labs (all labs ordered are listed, but only abnormal results are displayed) Labs Reviewed - No data to display  EKG   Radiology No results found.  Procedures Procedures (including critical care time)  Medications Ordered in UC Medications - No data to display  Initial Impression / Assessment and Plan / UC Course  I have reviewed the triage vital signs and the nursing notes.  Pertinent labs & imaging results that were available during my care of the patient were reviewed by me and considered in my medical decision making (see chart for details).     Vital signs and exam overall reassuring today without significant abnormality.  Reiterated that melatonin would be a good gentle option to try for sleep.  Discussed and importance of exercise, sleep hygiene and a consistent schedule as it sounds like he naps throughout the day to make up for his lack of sleep at night.  Close PCP follow-up recommended for further management.  Also recommended restarting his Protonix regimen for acid reflux as this is likely what is causing his morning nausea.  Very low suspicion for this medication to be causing his insomnia.  Return for acutely worsening symptoms at any time.  Final Clinical Impressions(s) / UC Diagnoses   Final diagnoses:  Insomnia, unspecified type  Nausea without vomiting     Discharge Instructions      Melatonin - May take 5-10 mg nightly at bedtime    ED Prescriptions   None    PDMP not reviewed this encounter.   [employer], [place] 06/10/21 [phone]

## 2021-06-14 ENCOUNTER — Other Ambulatory Visit: Payer: Self-pay

## 2021-06-14 ENCOUNTER — Other Ambulatory Visit: Payer: Medicare HMO

## 2021-06-14 ENCOUNTER — Telehealth: Payer: Self-pay

## 2021-06-14 DIAGNOSIS — R3912 Poor urinary stream: Secondary | ICD-10-CM | POA: Diagnosis not present

## 2021-06-14 LAB — URINALYSIS, ROUTINE W REFLEX MICROSCOPIC
Bilirubin, UA: NEGATIVE
Glucose, UA: NEGATIVE
Ketones, UA: NEGATIVE
Leukocytes,UA: NEGATIVE
Nitrite, UA: NEGATIVE
Protein,UA: NEGATIVE
RBC, UA: NEGATIVE
Specific Gravity, UA: 1.015 (ref 1.005–1.030)
Urobilinogen, Ur: 0.2 mg/dL (ref 0.2–1.0)
pH, UA: 7.5 (ref 5.0–7.5)

## 2021-06-14 NOTE — Telephone Encounter (Signed)
See other note

## 2021-06-14 NOTE — Telephone Encounter (Signed)
Pt left a Voice Message:  Needing to talks to the nurse/doctor about a question.  Please advise.  Call back:  [phone]  Thanks, Helene Kelp

## 2021-06-14 NOTE — Telephone Encounter (Signed)
Patient called and made aware of negative urine specimen.  Patient states that he is not having any pain or discomfort. His penis was stuck to his underwear and he saw some dried up discharged. Patient now voiced he never saw blood. Patient advised to monitor and call office back if it happens again.  Patient voiced understanding.

## 2021-06-14 NOTE — Telephone Encounter (Signed)
Pt called and said that he noticed that he has discharge to come out of his penile no pain and notice it on yesterday has happen since. Pt said that it was blood mixed in with it.Pt is coming in to for a urine drop off. Pt is not having any pain.

## 2021-06-22 DIAGNOSIS — Z6833 Body mass index (BMI) 33.0-33.9, adult: Secondary | ICD-10-CM | POA: Diagnosis not present

## 2021-06-22 DIAGNOSIS — R42 Dizziness and giddiness: Secondary | ICD-10-CM | POA: Diagnosis not present

## 2021-06-22 DIAGNOSIS — R1310 Dysphagia, unspecified: Secondary | ICD-10-CM | POA: Diagnosis not present

## 2021-06-22 DIAGNOSIS — R109 Unspecified abdominal pain: Secondary | ICD-10-CM | POA: Diagnosis not present

## 2021-06-22 DIAGNOSIS — E6609 Other obesity due to excess calories: Secondary | ICD-10-CM | POA: Diagnosis not present

## 2021-06-22 DIAGNOSIS — I10 Essential (primary) hypertension: Secondary | ICD-10-CM | POA: Diagnosis not present

## 2021-06-22 DIAGNOSIS — I4891 Unspecified atrial fibrillation: Secondary | ICD-10-CM | POA: Diagnosis not present

## 2021-06-22 DIAGNOSIS — G4700 Insomnia, unspecified: Secondary | ICD-10-CM | POA: Diagnosis not present

## 2021-06-27 ENCOUNTER — Other Ambulatory Visit (HOSPITAL_COMMUNITY): Payer: Self-pay | Admitting: Internal Medicine

## 2021-06-27 DIAGNOSIS — R1310 Dysphagia, unspecified: Secondary | ICD-10-CM

## 2021-07-04 ENCOUNTER — Telehealth: Payer: Self-pay | Admitting: Cardiovascular Disease

## 2021-07-04 ENCOUNTER — Other Ambulatory Visit (HOSPITAL_COMMUNITY): Payer: Self-pay | Admitting: Internal Medicine

## 2021-07-04 ENCOUNTER — Other Ambulatory Visit: Payer: Self-pay

## 2021-07-04 ENCOUNTER — Ambulatory Visit (HOSPITAL_COMMUNITY)
Admission: RE | Admit: 2021-07-04 | Discharge: 2021-07-04 | Disposition: A | Discharge status: Home / Self Care | Payer: Medicare HMO | Source: Ambulatory Visit | Attending: Internal Medicine | Admitting: Internal Medicine

## 2021-07-04 DIAGNOSIS — R1310 Dysphagia, unspecified: Secondary | ICD-10-CM

## 2021-07-04 DIAGNOSIS — K222 Esophageal obstruction: Secondary | ICD-10-CM | POA: Diagnosis not present

## 2021-07-04 MED ORDER — CARVEDILOL 6.25 MG PO TABS: 6.2500 mg | ORAL_TABLET | Freq: Two times a day (BID) | ORAL | 3 refills | Status: DC

## 2021-07-04 NOTE — Telephone Encounter (Signed)
This is a [place] pt.  °

## 2021-07-04 NOTE — Telephone Encounter (Signed)
Complete

## 2021-07-04 NOTE — Telephone Encounter (Signed)
*  STAT* If patient is at the pharmacy, call can be transferred to refill team.   1. Which medications need to be refilled? (please list name of each medication and dose if known) new prescription for Carvedilol  2. Which pharmacy/location (including street and city if local pharmacy) is medication to be sent to?[facility] [facility] [place]  3. Do they need a 30 day or 90 day supply? #180 and refills

## 2021-07-06 DIAGNOSIS — G43711 Chronic migraine without aura, intractable, with status migrainosus: Secondary | ICD-10-CM | POA: Diagnosis not present

## 2021-07-08 DIAGNOSIS — H40033 Anatomical narrow angle, bilateral: Secondary | ICD-10-CM | POA: Diagnosis not present

## 2021-07-08 DIAGNOSIS — G44219 Episodic tension-type headache, not intractable: Secondary | ICD-10-CM | POA: Diagnosis not present

## 2021-07-14 ENCOUNTER — Emergency Department (HOSPITAL_COMMUNITY): Payer: Medicare HMO

## 2021-07-14 ENCOUNTER — Emergency Department (HOSPITAL_COMMUNITY)
Admission: EM | Admit: 2021-07-14 | Discharge: 2021-07-14 | Disposition: A | Discharge status: Home / Self Care | Payer: Medicare HMO | Attending: Emergency Medicine | Admitting: Emergency Medicine

## 2021-07-14 ENCOUNTER — Other Ambulatory Visit: Payer: Self-pay

## 2021-07-14 ENCOUNTER — Encounter (HOSPITAL_COMMUNITY): Payer: Self-pay | Admitting: *Deleted

## 2021-07-14 DIAGNOSIS — R079 Chest pain, unspecified: Secondary | ICD-10-CM | POA: Diagnosis not present

## 2021-07-14 DIAGNOSIS — Z79899 Other long term (current) drug therapy: Secondary | ICD-10-CM | POA: Diagnosis not present

## 2021-07-14 DIAGNOSIS — R002 Palpitations: Secondary | ICD-10-CM | POA: Diagnosis not present

## 2021-07-14 DIAGNOSIS — Z7901 Long term (current) use of anticoagulants: Secondary | ICD-10-CM | POA: Insufficient documentation

## 2021-07-14 DIAGNOSIS — Z8546 Personal history of malignant neoplasm of prostate: Secondary | ICD-10-CM | POA: Diagnosis not present

## 2021-07-14 DIAGNOSIS — Z91040 Latex allergy status: Secondary | ICD-10-CM | POA: Diagnosis not present

## 2021-07-14 DIAGNOSIS — I10 Essential (primary) hypertension: Secondary | ICD-10-CM | POA: Insufficient documentation

## 2021-07-14 DIAGNOSIS — Z87891 Personal history of nicotine dependence: Secondary | ICD-10-CM | POA: Insufficient documentation

## 2021-07-14 LAB — TROPONIN I (HIGH SENSITIVITY)
Troponin I (High Sensitivity): 5 ng/L (ref <18)
Troponin I (High Sensitivity): 9 ng/L (ref <18)

## 2021-07-14 LAB — CBC
HCT: 41.6 % (ref 39.0–52.0)
Hemoglobin: 13.6 g/dL (ref 13.0–17.0)
MCH: 29.4 pg (ref 26.0–34.0)
MCHC: 32.7 g/dL (ref 30.0–36.0)
MCV: 90 fL (ref 80.0–100.0)
Platelets: 224 10*3/uL (ref 150–400)
RBC: 4.62 MIL/uL (ref 4.22–5.81)
RDW: 12 % (ref 11.5–15.5)
WBC: 4.2 10*3/uL (ref 4.0–10.5)
nRBC: 0 % (ref 0.0–0.2)

## 2021-07-14 LAB — BASIC METABOLIC PANEL
Anion gap: 6 (ref 5–15)
BUN: 12 mg/dL (ref 8–23)
CO2: 28 mmol/L (ref 22–32)
Calcium: 9.4 mg/dL (ref 8.9–10.3)
Chloride: 103 mmol/L (ref 98–111)
Creatinine, Ser: 1 mg/dL (ref 0.61–1.24)
GFR, Estimated: >60 mL/min (ref >60)
Glucose, Bld: 105 mg/dL — ABNORMAL HIGH (ref 70–99)
Potassium: 3.7 mmol/L (ref 3.5–5.1)
Sodium: 137 mmol/L (ref 135–145)

## 2021-07-14 LAB — TSH: TSH: 0.415 u[IU]/mL (ref 0.350–4.500)

## 2021-07-14 NOTE — ED Provider Notes (Signed)
[facility] EMERGENCY DEPARTMENT Provider Note   CSN: [id number] Arrival date & time: 07/14/21  1502     History Chief Complaint  Patient presents with   Chest Pain    Steven [name] is a 70 y.o. male with history of atrial fibrillation on Eliquis who presents emergency department with palpitations that began just prior to arrival.  Patient states that he was getting his oil changed when he felt a fluttering sensation in his chest.  This lasted approximately 5 to 6 minutes and resolved spontaneously.  Patient wanted to come get evaluated given his history of atrial fibrillation.  He currently denies any palpitations, chest pain, shortness of breath, cough, congestion, sore throat, fever, chills, abdominal pain, nausea, vomiting, and diarrhea.   Chest Pain     Past Medical History:  Diagnosis Date   Arthritis    Atrial fibrillation (HCC)    Atrial flutter (HCC)    BPH (benign prostatic hyperplasia)    Chest pain    Dizziness    Dyspnea    laying down occ   Fracture 08/17/2015   MULTIPLE RIB FRACTURES     FROM FALL    GERD (gastroesophageal reflux disease)    Hemorrhoids    History of kidney stones    noted on CT scan   History of radiation therapy 08/22/11-10/13/11   prostate   Hyperlipemia    Hypertension    Hyperthyroidism    Light headedness    Numbness and tingling in left arm    Numbness and tingling of both legs    Open fracture of left elbow 08/18/2015   Prostate cancer [facility])    prostate s/p radiation Mar 2013   Rib fractures 08/17/2015    Patient Active Problem List   Diagnosis Date Noted   Chronic pain 07/07/2020   Encounter to establish care 07/07/2020   Anxiety 07/07/2020   Carcinoma of prostate ([place]) 02/17/2020   Subacute thyroiditis 01/07/2020   Personal history of malignant neoplasm of prostate 12/12/2019   Benign prostatic hyperplasia with urinary obstruction 12/12/2019   Weak urinary stream 12/12/2019   Hyperthyroidism 11/24/2019   Weight loss  10/21/2019   IBS (irritable bowel syndrome) 10/21/2019   Dizzy spells 09/20/2019   Intractable chronic migraine without aura 09/20/2019   Obstructive sleep apnea syndrome 09/20/2019   Psychophysiologic insomnia 09/20/2019   Rectal pain 08/21/2019   Nausea without vomiting 05/30/2019   Atrial flutter ([place]) 04/17/2019   Chronic atrial fibrillation ([place]) 03/02/2019   HTN (hypertension) 03/02/2019   Constipation 08/02/2017   Hemorrhoids 08/02/2017   Fall 08/18/2015   Radiation proctitis 01/30/2013   Rectal bleeding 12/10/2012   Obese 05/30/2012   Malignant neoplasm of prostate ([place]) 08/13/2011    Past Surgical History:  Procedure Laterality Date   BOTOX INJECTION N/A 01/14/2020   Procedure: INJECTION OF BOTOX INTO ANAL SPHINCTER;  Surgeon: Ileana Roup, MD;  Location: [facility] ORS;  Service: General;  Laterality: N/A;   COLONOSCOPY N/A 12/19/2012   GGY:IRSWNI 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 Normal appearing colon   COLONOSCOPY WITH PROPOFOL N/A 10/23/2019   Procedure: COLONOSCOPY WITH PROPOFOL;  Surgeon: Daneil Dolin, MD; external and grade 2 internal hemorrhoids, abnormal rectal blood vessels consistent with radiation proctitis s/p APC therapy, otherwise normal exam.   EVALUATION UNDER ANESTHESIA WITH ANAL FISTULECTOMY N/A 01/14/2020   Procedure: ANORECTAL EXAM UNDER ANESTHESIA;  Surgeon: Ileana Roup, MD;  Location: [facility] ORS;  Service: General;  Laterality: N/A;   HOT HEMOSTASIS  10/23/2019   Procedure: HOT HEMOSTASIS (ARGON PLASMA COAGULATION/BICAP);  Surgeon: Daneil Dolin, MD;  Location: [facility];  Service: Endoscopy;;  apc rectal proctitis     KIDNEY SURGERY  1982   kidney tube collapse repair   RADIOACTIVE SEED IMPLANT     Prostate       Family History  Problem Relation Age of Onset   Aneurysm Mother    Hypertension Mother    Prostate cancer Father    Hypertension Father    Colon cancer  Neg Hx    Colon polyps Neg Hx     Social History   Tobacco Use   Smoking status: Former    Packs/day: 1.00    Years: 20.00    Pack years: 20.00    Types: Cigarettes    Quit date: 2020    Years since quitting: 2.9   Smokeless tobacco: Former   Tobacco comments:    Quit smoking x 2 years    07/2015   SOMETIIMES i USE VAPOR   Vaping Use   Vaping Use: Never used  Substance Use Topics   Alcohol use: Never   Drug use: Never    Home Medications Prior to Admission medications   Medication Sig Start Date End Date Taking? Authorizing Provider  alfuzosin (UROXATRAL) 10 MG 24 hr tablet Take 1 tablet (10 mg total) by mouth daily with breakfast. 04/06/21   [name], Candee Furbish, MD  amitriptyline (ELAVIL) 10 MG tablet Take 10 mg by mouth at bedtime as needed for sleep. 07/07/20   [provider]  amLODipine (NORVASC) 10 MG tablet Take 1 tablet by mouth daily. 12/08/20   [provider]  Ascorbic Acid (VITAMIN C) 1000 MG tablet Take 1,000 mg by mouth daily.    [provider]  carvedilol (COREG) 6.25 MG tablet Take 1 tablet (6.25 mg total) by mouth 2 (two) times daily. 07/04/21   Josue Hector, MD  Cholecalciferol (VITAMIN D3) 125 MCG (5000 UT) CAPS Take 1 capsule by mouth daily.     [provider]  clotrimazole-betamethasone (LOTRISONE) cream Apply 1 application topically 2 (two) times daily. 02/17/21   [provider]  Cyanocobalamin (B-12 PO) Take 1 tablet by mouth daily.     [provider]  diazepam (VALIUM) 5 MG tablet Take 5 mg by mouth daily as needed for anxiety.    [provider]  ELIQUIS 5 MG TABS tablet TAKE 1 TABLET(5 MG) BY MOUTH TWICE DAILY 03/29/21   Josue Hector, MD  Erenumab-aooe (AIMOVIG) 70 MG/ML SOAJ Inject 1 mL into the skin every 30 (thirty) days.    [provider]  gabapentin (NEURONTIN) 300 MG capsule Take 300 mg by mouth 3 (three) times daily. 02/24/20   [provider]   hydrochlorothiazide (HYDRODIURIL) 25 MG tablet Take 25 mg by mouth daily. 07/28/19   [provider]  losartan (COZAAR) 100 MG tablet Take 100 mg by mouth daily. 01/05/21   [provider]  minocycline (MINOCIN) 100 MG capsule Take 100 mg by mouth daily. 03/03/21   [provider]  NEXLETOL 180 MG TABS Take 180 mg by mouth daily. Pt taking 2-3 times a week 12/03/19   [provider]  ondansetron (ZOFRAN ODT) 4 MG disintegrating tablet Take 1 tablet (4 mg total) by mouth every 8 (eight) hours as needed for nausea or vomiting. 06/08/20   Avegno, Darrelyn Hillock, FNP  pantoprazole (PROTONIX) 40 MG tablet TAKE  1 TABLET BY MOUTH DAILY 30 MINUTES BEFORE BREAKFAST 05/24/21   [name] S, PA-C  polyethylene glycol powder (GLYCOLAX/MIRALAX) 17 GM/SCOOP powder Take 0.5 Containers by mouth daily.    [provider]  potassium chloride SA (KLOR-CON) 20 MEQ tablet Take 20 mEq by mouth 2 (two) times daily. 12/09/19   [provider]  Probiotic Product (PROBIOTIC-10 PO) Take 1 capsule by mouth daily.    [provider]  sertraline (ZOLOFT) 25 MG tablet Take 25 mg by mouth daily. 03/03/21   [provider]  SUMAtriptan (IMITREX) 50 MG tablet Take 1 tablet (50 mg total) by mouth every 2 (two) hours as needed for migraine. May repeat in 2 hours if headache persists or recurs. 03/05/21   Roemhildt, Lorin T, PA-C  tadalafil (CIALIS) 20 MG tablet Take 1 tablet (20 mg total) by mouth daily as needed. Patient taking differently: Take 20 mg by mouth daily as needed for erectile dysfunction. 09/20/20   [name], Candee Furbish, MD  traMADol (ULTRAM) 50 MG tablet Take 50 mg by mouth every 6 (six) hours as needed for moderate pain.    [provider]  Wheat Dextrin (BENEFIBER DRINK MIX PO) Take 1 oz by mouth daily.    [provider]    Allergies    Latex  Review of Systems   Review of Systems  Cardiovascular:  Positive for chest pain.  All  other systems reviewed and are negative.  Physical Exam Updated Vital Signs BP 125/79    Pulse 69    Temp 98 F (36.7 C) (Oral)    Resp 13    Ht 5\' 7"  (1.702 m)    Wt 93.4 kg    SpO2 100%    BMI 32.26 kg/m   Physical Exam Vitals and nursing note reviewed.  Constitutional:      General: He is not in acute distress.    Appearance: Normal appearance.  HENT:     Head: Normocephalic and atraumatic.  Eyes:     General:        Right eye: No discharge.        Left eye: No discharge.  Cardiovascular:     Comments: Regular rate and rhythm.  S1/S2 are distinct without any evidence of murmur, rubs, or gallops.  Radial pulses are 2+ bilaterally.  Dorsalis pedis pulses are 2+ bilaterally.  No evidence of pedal edema. Pulmonary:     Comments: Clear to auscultation bilaterally.  Normal effort.  No respiratory distress.  No evidence of wheezes, rales, or rhonchi heard throughout. Abdominal:     General: Abdomen is flat. Bowel sounds are normal. There is no distension.     Tenderness: There is no abdominal tenderness. There is no guarding or rebound.  Musculoskeletal:        General: Normal range of motion.     Cervical back: Neck supple.  Skin:    General: Skin is warm and dry.     Findings: No rash.  Neurological:     General: No focal deficit present.     Mental Status: He is alert.  Psychiatric:        Mood and Affect: Mood normal.        Behavior: Behavior normal.    ED Results / Procedures / Treatments   Labs (all labs ordered are listed, but only abnormal results are displayed) Labs Reviewed  BASIC METABOLIC PANEL - Abnormal; Notable for the following components:      Result Value  Glucose, Bld 105 (*)    All other components within normal limits  CBC  TSH  TROPONIN I (HIGH SENSITIVITY)  TROPONIN I (HIGH SENSITIVITY)    EKG EKG Interpretation  Date/Time:  Thursday July 14 2021 15:14:02 EST Ventricular Rate:  77 PR Interval:  132 QRS Duration: 84 QT  Interval:  354 QTC Calculation: 400 R Axis:   -6 Text Interpretation: Normal sinus rhythm Normal ECG Confirmed by Thamas Jaegers ([id number]) on 07/14/2021 4:43:09 PM  Radiology DG Chest 2 View  Result Date: 07/14/2021 CLINICAL DATA:  Chest pain, history of AFib EXAM: CHEST - 2 VIEW COMPARISON:  Chest radiograph 01/04/2021 FINDINGS: The cardiomediastinal silhouette is normal. Linear opacities in the left lung base are unchanged, likely reflecting scarring. There is no focal consolidation or pulmonary edema. There is no pleural effusion or pneumothorax. There is no acute osseous abnormality. IMPRESSION: Stable chest with no radiographic evidence of acute cardiopulmonary process. Electronically Signed   By: Valetta Mole M.D.   On: 07/14/2021 15:40    Procedures Procedures   Medications Ordered in ED Medications - No data to display  ED Course  I have reviewed the triage vital signs and the nursing notes.  Pertinent labs & imaging results that were available during my care of the patient were reviewed by me and considered in my medical decision making (see chart for details).    MDM Rules/Calculators/A&P                          JAKAYDEN CANCIO is a 70 y.o. male with history of atrial fibrillation who presents the emergency department with palpitations.  Vital signs are reassuring.  On my initial examination, patient is not currently in atrial fibrillation.  Given his episode and history we will get some basic labs and troponin level.  Also get a chest x-ray.  CBC and BMP without any significant abnormalities.  Initial delta troponin negative.  TSH was normal.  Given clinical scenario, exam without any volume overload's have a low suspicion for heart failure at this time.  EKG without any signs of active ischemia.  I have a low suspicion for ACS.  His presentation is not consistent with acute pulmonary embolism at this time, pneumothorax, dissection, or significant arrhythmia.  Discussed results  with the patient.  He expressed full understanding.  We will have him follow-up with his primary care provider.  Strict return precautions given.  He is safe for discharge.    Final Clinical Impression(s) / ED Diagnoses Final diagnoses:  Palpitations    Rx / DC Orders ED Discharge Orders     None        Steven [name] 07/14/21 1847    Luna Fuse, MD 07/21/21 [id number]

## 2021-07-14 NOTE — ED Triage Notes (Signed)
C/o heart fluttering, history of a fib

## 2021-07-14 NOTE — Discharge Instructions (Addendum)
Your work-up today was normal.  I do not think you are in atrial fibrillation at this time.  I would like for you to follow-up with your primary care provider for further evaluation.  Please return to the emergency department if you experience worsening symptoms, palpitations that will not resolve, chest pain, shortness of breath, or any other concerns you might have.

## 2021-07-14 NOTE — ED Triage Notes (Signed)
Chest pain

## 2021-07-15 ENCOUNTER — Telehealth: Payer: Self-pay | Admitting: Cardiovascular Disease

## 2021-07-15 NOTE — Telephone Encounter (Signed)
Spoke to pt who stated that he had a dizzy spell yesterday. Pt had to have friend/mechanic drive him to the ER. Per ER evaluation labs were normal, troponin negative, pt was not in a fib.   Pt admitted that he was standing over a kerosene heater warming his hands while his vehicle was being fixed. Pt stated he is not sure if this had anything to do with his episode. Pt checked bp and hr while on the phone with [facility]- bp: 136/78 hr: 67- pt aware that these readings are within normal range.   Pt aware to seek emergent ER evaluation should symptoms of of cp,chest pressure, sob, nausea, etc occur.   Will FYI provider

## 2021-07-15 NOTE — Telephone Encounter (Signed)
Patient c/o Palpitations:  High priority if patient c/o lightheadedness, shortness of breath, or chest pain  How long have you had palpitations/irregular HR/ Afib? Are you having the symptoms now? YESTERDAY NOT TODAY  Are you currently experiencing lightheadedness, SOB or CP? LIGHTHEADEDNESS YESTERDAY  Do you have a history of afib (atrial fibrillation) or irregular heart rhythm? YES  Have you checked your BP or HR? (document readings if available): CHECKED AT THE ER 127/78  Are you experiencing any other symptoms? PT WAS AT THE GARAGE YESTERDAY GETTING HIS OIL CHANGED WHEN HE STARTED EXPERIENCING PALPITATIONS, HE SAID HE HAD TO GET HIS MECHANIC TO DRIVE HIM TO THE ER             PT ALSO EXPERIENCE RINGING    IN      [place] FROM 09/22 TO 10/22

## 2021-07-20 NOTE — Telephone Encounter (Signed)
Patient was calling back to follow up on his call from last week. He wanted to know if Dr. Johnsie Cancel had any recommendations.  Patient said other than nausea he has not had any symptoms since his ED visit

## 2021-07-22 DIAGNOSIS — E669 Obesity, unspecified: Secondary | ICD-10-CM | POA: Diagnosis not present

## 2021-07-22 DIAGNOSIS — G43709 Chronic migraine without aura, not intractable, without status migrainosus: Secondary | ICD-10-CM | POA: Diagnosis not present

## 2021-07-22 DIAGNOSIS — Z6833 Body mass index (BMI) 33.0-33.9, adult: Secondary | ICD-10-CM | POA: Diagnosis not present

## 2021-07-22 DIAGNOSIS — G4700 Insomnia, unspecified: Secondary | ICD-10-CM | POA: Diagnosis not present

## 2021-07-22 DIAGNOSIS — K581 Irritable bowel syndrome with constipation: Secondary | ICD-10-CM | POA: Diagnosis not present

## 2021-07-22 NOTE — Telephone Encounter (Signed)
Pt notified and voiced understanding 

## 2021-07-22 NOTE — Telephone Encounter (Signed)
Returned call to pt no answer, left msg to call back. Spoke with Dr. Johnsie Cancel regarding this and he encouraged pt to follow up with his PCP regarding this.

## 2021-08-08 DIAGNOSIS — R55 Syncope and collapse: Secondary | ICD-10-CM | POA: Diagnosis not present

## 2021-08-08 DIAGNOSIS — G43711 Chronic migraine without aura, intractable, with status migrainosus: Secondary | ICD-10-CM | POA: Diagnosis not present

## 2021-08-08 DIAGNOSIS — G4733 Obstructive sleep apnea (adult) (pediatric): Secondary | ICD-10-CM | POA: Diagnosis not present

## 2021-08-08 DIAGNOSIS — R42 Dizziness and giddiness: Secondary | ICD-10-CM | POA: Diagnosis not present

## 2021-08-08 DIAGNOSIS — F419 Anxiety disorder, unspecified: Secondary | ICD-10-CM | POA: Diagnosis not present

## 2021-08-22 DIAGNOSIS — H9311 Tinnitus, right ear: Secondary | ICD-10-CM | POA: Diagnosis not present

## 2021-08-22 DIAGNOSIS — K589 Irritable bowel syndrome without diarrhea: Secondary | ICD-10-CM | POA: Diagnosis not present

## 2021-08-22 DIAGNOSIS — G43909 Migraine, unspecified, not intractable, without status migrainosus: Secondary | ICD-10-CM | POA: Diagnosis not present

## 2021-08-22 DIAGNOSIS — I10 Essential (primary) hypertension: Secondary | ICD-10-CM | POA: Diagnosis not present

## 2021-08-22 DIAGNOSIS — R600 Localized edema: Secondary | ICD-10-CM | POA: Diagnosis not present

## 2021-08-22 DIAGNOSIS — Z0189 Encounter for other specified special examinations: Secondary | ICD-10-CM | POA: Diagnosis not present

## 2021-08-22 DIAGNOSIS — F5104 Psychophysiologic insomnia: Secondary | ICD-10-CM | POA: Diagnosis not present

## 2021-08-22 DIAGNOSIS — I4891 Unspecified atrial fibrillation: Secondary | ICD-10-CM | POA: Diagnosis not present

## 2021-08-22 DIAGNOSIS — F419 Anxiety disorder, unspecified: Secondary | ICD-10-CM | POA: Diagnosis not present

## 2021-08-30 DIAGNOSIS — I10 Essential (primary) hypertension: Secondary | ICD-10-CM | POA: Diagnosis not present

## 2021-08-30 DIAGNOSIS — Z0000 Encounter for general adult medical examination without abnormal findings: Secondary | ICD-10-CM | POA: Diagnosis not present

## 2021-09-05 DIAGNOSIS — R42 Dizziness and giddiness: Secondary | ICD-10-CM | POA: Diagnosis not present

## 2021-09-05 DIAGNOSIS — F419 Anxiety disorder, unspecified: Secondary | ICD-10-CM | POA: Diagnosis not present

## 2021-09-05 DIAGNOSIS — G43711 Chronic migraine without aura, intractable, with status migrainosus: Secondary | ICD-10-CM | POA: Diagnosis not present

## 2021-09-05 DIAGNOSIS — R55 Syncope and collapse: Secondary | ICD-10-CM | POA: Diagnosis not present

## 2021-09-05 DIAGNOSIS — G4733 Obstructive sleep apnea (adult) (pediatric): Secondary | ICD-10-CM | POA: Diagnosis not present

## 2021-09-06 DIAGNOSIS — F419 Anxiety disorder, unspecified: Secondary | ICD-10-CM | POA: Diagnosis not present

## 2021-09-06 DIAGNOSIS — F5104 Psychophysiologic insomnia: Secondary | ICD-10-CM | POA: Diagnosis not present

## 2021-09-06 DIAGNOSIS — K589 Irritable bowel syndrome without diarrhea: Secondary | ICD-10-CM | POA: Diagnosis not present

## 2021-09-06 DIAGNOSIS — I10 Essential (primary) hypertension: Secondary | ICD-10-CM | POA: Diagnosis not present

## 2021-09-06 DIAGNOSIS — R600 Localized edema: Secondary | ICD-10-CM | POA: Diagnosis not present

## 2021-09-06 DIAGNOSIS — H9311 Tinnitus, right ear: Secondary | ICD-10-CM | POA: Diagnosis not present

## 2021-09-06 DIAGNOSIS — I4891 Unspecified atrial fibrillation: Secondary | ICD-10-CM | POA: Diagnosis not present

## 2021-09-06 DIAGNOSIS — G43909 Migraine, unspecified, not intractable, without status migrainosus: Secondary | ICD-10-CM | POA: Diagnosis not present

## 2021-09-06 DIAGNOSIS — Z0001 Encounter for general adult medical examination with abnormal findings: Secondary | ICD-10-CM | POA: Diagnosis not present

## 2021-09-19 DIAGNOSIS — H9313 Tinnitus, bilateral: Secondary | ICD-10-CM | POA: Diagnosis not present

## 2021-09-19 DIAGNOSIS — H903 Sensorineural hearing loss, bilateral: Secondary | ICD-10-CM | POA: Diagnosis not present

## 2021-09-29 ENCOUNTER — Encounter: Payer: Self-pay | Admitting: *Deleted

## 2021-09-29 ENCOUNTER — Other Ambulatory Visit: Payer: Self-pay | Admitting: *Deleted

## 2021-10-03 DIAGNOSIS — I482 Chronic atrial fibrillation, unspecified: Secondary | ICD-10-CM | POA: Diagnosis not present

## 2021-10-03 DIAGNOSIS — G43719 Chronic migraine without aura, intractable, without status migrainosus: Secondary | ICD-10-CM | POA: Diagnosis not present

## 2021-10-03 DIAGNOSIS — G4733 Obstructive sleep apnea (adult) (pediatric): Secondary | ICD-10-CM | POA: Diagnosis not present

## 2021-10-03 DIAGNOSIS — R42 Dizziness and giddiness: Secondary | ICD-10-CM | POA: Diagnosis not present

## 2021-10-03 DIAGNOSIS — F5104 Psychophysiologic insomnia: Secondary | ICD-10-CM | POA: Diagnosis not present

## 2021-10-04 ENCOUNTER — Ambulatory Visit: Payer: Medicare HMO | Admitting: Psychiatry

## 2021-10-04 ENCOUNTER — Encounter: Payer: Self-pay | Admitting: Psychiatry

## 2021-10-04 VITALS — BP 121/71 | HR 63 | Ht 66.0 in | Wt 213.0 lb

## 2021-10-04 DIAGNOSIS — G629 Polyneuropathy, unspecified: Secondary | ICD-10-CM

## 2021-10-04 DIAGNOSIS — G4486 Cervicogenic headache: Secondary | ICD-10-CM

## 2021-10-04 NOTE — Patient Instructions (Addendum)
Increase gabapentin to 300 mg in the morning, 300 mg in the afternoon, and 600 mg at bedtime for one week. Then can increase to 600 mg in the morning, 300 mg in the afternoon, and 600 mg at bedtime for one week. Then increase to 600 mg three times a day. ? ?Stop Nurtec ? ?

## 2021-10-04 NOTE — Progress Notes (Signed)
? ?Referring:  ?Valentino Nose, FNP ?[address] ?[address] ?[place],  [place] [address] ? ?PCP: ?Redmond School, MD ? ?Neurology was asked to evaluate Steven [name], a 71 year old male for a chief complaint of headaches.  Our recommendations of care will be communicated by shared medical record.   ? ?CC:  headaches ? ?HPI:  ?Medical co-morbidities: afib, HTN, OSA, hyperthyroidism, IBS ? ?The patient presents for evaluation of headaches which began a couple of years ago. They are described as occipital and retro-orbital aching and burning. They are associated with photophobia, phonophobia, and nausea. Headaches last for 10-15 minutes at a time. They usually occur once per day or once every other day. Rarely occurs more than once in a day. He also has ringing in his right ear which is worse when he has a headache. He takes Aimovig monthly for migraine prevention. Doesn't know if this is helpful but is afraid to stop taking it. ? ?He follows with [facility] for cervical stenosis. States he is supposed to have a follow up appointment with them this month. He tried neck PT previously but has stopped doing the home exercises. ? ?States he has had dizziness ever since he was diagnosed with afib in 2020. Describes this as lightheadedness and feeling like he is going to pass out. Denies vertigo but feels he will lean forward when standing up.  ? ?He will also get stabbing pains in his extremities. Reportedly recently had an EMG which showed neuropathy. Takes gabapentin which he does not find particularly helpful. Follows with Neurology for neuropathy and headaches, these notes are unfortunately unavailable at this time. Recently had blood work which is unavailable, but states he was told to take B12 supplements per his PCP and that he was told his blood sugar is high. ? ?Headache History: ?Onset: 2021 ?Triggers: TV screen ?Aura: no ?Location: frontal, retro-orbital, occipital ?Quality/Description: aching, burning ?Associated  Symptoms: ? Photophobia: yes ? Phonophobia: yes ? Nausea: yes ?Other symptoms: irritable ?Worse with activity?: yes ?Duration of headaches: 10-15 minutes ? ?Headache days per month: 30 ?Headache free days per month: 0 ? ?Current Treatment: ?Abortive ?Tylenol ? ?Preventative ?Aimovig 70 mg monthly ?Gabapentin 300 mg TID ? ?Prior Therapies                                 ?Amitriptyline 10 mg QHS ?Topamax 100 mg daily - kidney stones ?Losartan 100 mg daily ?Carvedilol 6.25 mg daily ?Aimovig 70 mg  ?Gabapentin 300 mg TID ?Imitrex 50 mg PRN - side effects ?Nurtec 75 mg PRN - lack of efficacy ?Valium ?Dramamine ?Flexeril ?Robaxin ?Zofran ? ?LABS: ?07/14/21: TSH 0.415 ? ?IMAGING:  ?CTA head 06/02/21: no occlusion or hemodynamically significant stenosis ? ?[place] 02/15/27: no acute intracranial process ? ?MRI brain without contrast 07/08/20: unremarkable ? ?MRI C-spine 04/06/20: ?Cervical spine spondylosis most notable at C6-C7 with a left ?paracentral disc protrusion contacting left C7 nerve root without ?impingement. There is also severe right and moderate to severe left ?neural foraminal narrowing as well as mild to moderate central canal ?stenosis. Also at C3-C4 with severe left neural foraminal narrowing ?and mild central canal stenosis. ?  ? ?Imaging independently reviewed on October 04, 2021  ? ?Current Outpatient Medications on File Prior to Visit  ?Medication Sig Dispense Refill  ? alfuzosin (UROXATRAL) 10 MG 24 hr tablet Take 1 tablet (10 mg total) by mouth daily with breakfast. 90 tablet 3  ? amitriptyline (  ELAVIL) 10 MG tablet Take 10 mg by mouth at bedtime as needed for sleep.    ? amLODipine (NORVASC) 10 MG tablet Take 1 tablet by mouth daily.    ? Ascorbic Acid (VITAMIN C) 1000 MG tablet Take 1,000 mg by mouth daily.    ? carvedilol (COREG) 6.25 MG tablet Take 1 tablet (6.25 mg total) by mouth 2 (two) times daily. 180 tablet 3  ? Cholecalciferol (VITAMIN D3) 125 MCG (5000 UT) CAPS Take 1 capsule by mouth daily.      ? clotrimazole-betamethasone (LOTRISONE) cream Apply 1 application topically 2 (two) times daily.    ? Cyanocobalamin (B-12 PO) B12    ? diazepam (VALIUM) 5 MG tablet Take 5 mg by mouth daily as needed for anxiety.    ? ELIQUIS 5 MG TABS tablet TAKE 1 TABLET(5 MG) BY MOUTH TWICE DAILY 60 tablet 6  ? Erenumab-aooe (AIMOVIG) 70 MG/ML SOAJ Inject 1 mL into the skin every 30 (thirty) days.    ? fluticasone (FLONASE) 50 MCG/ACT nasal spray fluticasone propionate 50 mcg/actuation nasal spray,suspension ? SHAKE LIQUID AND USE 1 SPRAY IN EACH NOSTRIL EVERY DAY    ? gabapentin (NEURONTIN) 300 MG capsule Take 300 mg by mouth 3 (three) times daily.    ? hydrochlorothiazide (HYDRODIURIL) 25 MG tablet Take 25 mg by mouth daily.    ? losartan (COZAAR) 100 MG tablet Take 100 mg by mouth daily.    ? minocycline (DYNACIN) 100 MG tablet minocycline 100 mg tablet ? Take 1 tablet every 12 hours by oral route.    ? NEXLETOL 180 MG TABS Take 180 mg by mouth daily. Pt taking 2-3 times a week    ? ondansetron (ZOFRAN ODT) 4 MG disintegrating tablet Take 1 tablet (4 mg total) by mouth every 8 (eight) hours as needed for nausea or vomiting. 30 tablet 0  ? polyethylene glycol (MIRALAX / GLYCOLAX) 17 g packet Miralax    ? potassium chloride SA (KLOR-CON) 20 MEQ tablet Take 20 mEq by mouth 2 (two) times daily.    ? Probiotic Product (PROBIOTIC-10 PO) Take 1 capsule by mouth daily.    ? Rimegepant Sulfate (NURTEC) 75 MG TBDP Nurtec ODT 75 mg disintegrating tablet ? Take 1 tablet every other day by oral route as needed.    ? SUMAtriptan (IMITREX) 50 MG tablet Take 1 tablet (50 mg total) by mouth every 2 (two) hours as needed for migraine. May repeat in 2 hours if headache persists or recurs. 10 tablet 0  ? Wheat Dextrin (BENEFIBER DRINK MIX PO) Take 1 oz by mouth daily.    ? pantoprazole (PROTONIX) 40 MG tablet TAKE 1 TABLET BY MOUTH DAILY 30 MINUTES BEFORE BREAKFAST (Patient not taking: Reported on 10/04/2021) 90 tablet 0  ? sertraline (ZOLOFT)  25 MG tablet Take 25 mg by mouth daily. (Patient not taking: Reported on 10/04/2021)    ? tadalafil (CIALIS) 20 MG tablet Take 1 tablet (20 mg total) by mouth daily as needed. (Patient not taking: Reported on 10/04/2021) 10 tablet 5  ? traMADol (ULTRAM) 50 MG tablet Take 50 mg by mouth every 6 (six) hours as needed for moderate pain. (Patient not taking: Reported on 10/04/2021)    ? ?No current facility-administered medications on file prior to visit.  ? ? ? ?Allergies: ?Allergies  ?Allergen Reactions  ? Latex Rash  ?  Possible reaction to latex gloves per patient  ? Meclizine   ?  Other reaction(s): Abdominal Pain, Other  ? ? ?Family  History: ?Family History  ?Problem Relation Age of Onset  ? Aneurysm Mother   ?     aortic  ? Hypertension Mother   ? Headache Mother   ? Prostate cancer Father   ? Hypertension Father   ? Colon cancer Neg Hx   ? Colon polyps Neg Hx   ? ? ? ?Past Medical History: ?Past Medical History:  ?Diagnosis Date  ? Anxiety   ? Arthritis   ? Atrial fibrillation ([place])   ? Atrial flutter ([place])   ? BPH (benign prostatic hyperplasia)   ? Chest pain   ? Dizziness   ? Dyspnea   ? laying down occ  ? Fracture 08/17/2015  ? MULTIPLE RIB FRACTURES     FROM FALL   ? GERD (gastroesophageal reflux disease)   ? Hemorrhoids   ? History of kidney stones   ? noted on CT scan  ? History of radiation therapy 08/22/11-10/13/11  ? prostate  ? Hyperlipemia   ? Hypertension   ? Hyperthyroidism   ? IBS (irritable bowel syndrome)   ? Insomnia   ? Light headedness   ? Migraine   ? Numbness and tingling in left arm   ? Numbness and tingling of both legs   ? Open fracture of left elbow 08/18/2015  ? Prostate cancer ([place])   ? prostate s/p radiation Mar 2013  ? Rib fractures 08/17/2015  ? Tinnitus   ? ? ?Past Surgical History ?Past Surgical History:  ?Procedure Laterality Date  ? BOTOX INJECTION N/A 01/14/2020  ? Procedure: INJECTION OF BOTOX INTO ANAL SPHINCTER;  Surgeon: Ileana Roup, MD;  Location: [facility];  Service:  General;  Laterality: N/A;  ? COLONOSCOPY N/A 12/19/2012  ? [id number] 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 Normal appearing colon  ? COLONOSCOPY WITH PRO

## 2021-10-06 NOTE — Progress Notes (Signed)
? ? ?Referring Provider: Redmond School, MD ?Primary Care Physician:  Redmond School, MD ?Primary GI Physician: Dr. Gala Romney ? ?Chief Complaint  ?Patient presents with  ? Abdominal Pain  ?  Nausea, constipation, loose bowels, seen a little blood yesterday   ? ? ?HPI:   ?Steven [name] is a 71 y.o. male with history of rectal pain, anal fissure failing topical treatment s/p Botox therapy with [place] [facility] and ultimately referred to [place] and physical therapy as there was some question of levator and eye syndrome by Dr. Dema Severin with [facility]. Ultimately saw Dr. Morton Stall with [facility] general surgery who did not see any need for further surgical intervention. He has ultimately been treated supportively with creams and with goal of better management of chronic constipation/IBS. He has been on colace, Trulance which caused diarrhea, and various doses of MiraLAX, and fiber.   ? ?Also with history of weight loss.  Colonoscopy April 2021 with external and grade 2 internal hemorrhoids, abnormal rectum with blood vessels consistent with radiation proctitis s/p APC therapy.  CT A/P with contrast May 2021 with no acute findings, questionable mild segmental circumferential wall thickening in lower rectum, but with recent colonoscopy, this was likely due to underdistension, chronic mild diffuse bladder wall thickening unchanged, no findings to explain weight loss.  He was ultimately diagnosed with subacute thyroiditis by Dr. Dorris Fetch and was started on methimazole in May 2021-September 2021.  ? ? ?He is presenting today for nausea and constipation. We have not seen patient since August 2021.   ? ? ?Today: ?Nausea:  ?Wakes up in the morning with nausea most days. Started 3-4 months. No vomiting. No heartburn. Occasional indigestion. Eats at 5-6 pm, lays down at 7pm. Not taking pantoprazole. Taking omeprazole occasionally in the morning which helps some. Tried daily, but didn't resolve the nausea. Hasn't tried twice  daily on a regular basis.Has gained 39 lbs since I saw him in August 2021.  No significant early satiety.  ? ?No NSAIDs.  ? ?Valium and Zofran help. Taking Zofran once daily.  ? ?Constipation/loose stools:  ?Doesn't feel he is emptying well. 3-4 Bms daily. Will start as a good BM,. But then have small incomplete Bms. Loose mushy stools. Taking MiraALX 1-1.5 cups daily. Was taking metamucil last week and felt this helped. Stopped because it said don't take more than 7 days on the bottle.  Drinks about 6 bottles of water a day.  Had a little blood on toilet tissue  yesterday, but thinks this was from around his rectum from irritation from wiping. Nothing in his stools.  ? ? ?Past Medical History:  ?Diagnosis Date  ? Anxiety   ? Arthritis   ? Atrial fibrillation ([place])   ? Atrial flutter ([place])   ? BPH (benign prostatic hyperplasia)   ? Chest pain   ? Dizziness   ? Dyspnea   ? laying down occ  ? Fracture 08/17/2015  ? MULTIPLE RIB FRACTURES     FROM FALL   ? GERD (gastroesophageal reflux disease)   ? Hemorrhoids   ? History of kidney stones   ? noted on CT scan  ? History of radiation therapy 08/22/11-10/13/11  ? prostate  ? Hyperlipemia   ? Hypertension   ? Hyperthyroidism   ? IBS (irritable bowel syndrome)   ? Insomnia   ? Light headedness   ? Migraine   ? Numbness and tingling in left arm   ? Numbness and tingling of both legs   ?  Open fracture of left elbow 08/18/2015  ? Prostate cancer ([place])   ? prostate s/p radiation Mar 2013  ? Rib fractures 08/17/2015  ? Tinnitus   ? ? ?Past Surgical History:  ?Procedure Laterality Date  ? BOTOX INJECTION N/A 01/14/2020  ? Procedure: INJECTION OF BOTOX INTO ANAL SPHINCTER;  Surgeon: Ileana Roup, MD;  Location: [facility];  Service: General;  Laterality: N/A;  ? COLONOSCOPY N/A 12/19/2012  ? AJO:INOMVE 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 Normal appearing colon  ? COLONOSCOPY WITH PROPOFOL N/A 10/23/2019  ? Procedure:  COLONOSCOPY WITH PROPOFOL;  Surgeon: Daneil Dolin, MD; external and grade 2 internal hemorrhoids, abnormal rectal blood vessels consistent with radiation proctitis s/p APC therapy, otherwise normal exam.  ? EVALUATION UNDER ANESTHESIA WITH ANAL FISTULECTOMY N/A 01/14/2020  ? Procedure: ANORECTAL EXAM UNDER ANESTHESIA;  Surgeon: Ileana Roup, MD;  Location: [facility];  Service: General;  Laterality: N/A;  ? HOT HEMOSTASIS  10/23/2019  ? Procedure: HOT HEMOSTASIS (ARGON PLASMA COAGULATION/BICAP);  Surgeon: Daneil Dolin, MD;  [facility];  Service: Endoscopy;;  apc rectal proctitis  ?  ? KIDNEY SURGERY  1982  ? kidney tube collapse repair  ? RADIOACTIVE SEED IMPLANT    ? Prostate  ? ? ?Current Outpatient Medications  ?Medication Sig Dispense Refill  ? alfuzosin (UROXATRAL) 10 MG 24 hr tablet Take 1 tablet (10 mg total) by mouth daily with breakfast. 90 tablet 3  ? amitriptyline (ELAVIL) 10 MG tablet Take 10 mg by mouth at bedtime as needed for sleep.    ? amLODipine (NORVASC) 10 MG tablet Take 1 tablet by mouth daily.    ? carvedilol (COREG) 6.25 MG tablet Take 1 tablet (6.25 mg total) by mouth 2 (two) times daily. 180 tablet 3  ? Cholecalciferol (VITAMIN D3) 125 MCG (5000 UT) CAPS Take 1 capsule by mouth daily.     ? clotrimazole-betamethasone (LOTRISONE) cream Apply 1 application topically 2 (two) times daily.    ? Cyanocobalamin (B-12 PO) B12    ? diazepam (VALIUM) 5 MG tablet Take 5 mg by mouth daily as needed for anxiety.    ? ELIQUIS 5 MG TABS tablet TAKE 1 TABLET(5 MG) BY MOUTH TWICE DAILY 60 tablet 6  ? Erenumab-aooe (AIMOVIG) 70 MG/ML SOAJ Inject 1 mL into the skin every 30 (thirty) days.    ? fluticasone (FLONASE) 50 MCG/ACT nasal spray fluticasone propionate 50 mcg/actuation nasal spray,suspension ? SHAKE LIQUID AND USE 1 SPRAY IN EACH NOSTRIL EVERY DAY    ? gabapentin (NEURONTIN) 300 MG capsule Take 300 mg by mouth 3 (three) times daily.    ? hydrochlorothiazide (HYDRODIURIL) 25 MG  tablet Take 25 mg by mouth daily.    ? losartan (COZAAR) 100 MG tablet Take 100 mg by mouth daily.    ? minocycline (DYNACIN) 100 MG tablet minocycline 100 mg tablet ? Take 1 tablet every 12 hours by oral route.    ? NEXLETOL 180 MG TABS Take 180 mg by mouth daily. Pt taking 2-3 times a week    ? omeprazole (PRILOSEC) 20 MG capsule Take 1 capsule (20 mg total) by mouth 2 (two) times daily before a meal. 60 capsule 3  ? ondansetron (ZOFRAN ODT) 4 MG disintegrating tablet Take 1 tablet (4 mg total) by mouth every 8 (eight) hours as needed for nausea or vomiting. 30 tablet 0  ? polyethylene glycol (MIRALAX / GLYCOLAX) 17 g packet Miralax    ?  potassium chloride SA (KLOR-CON) 20 MEQ tablet Take 20 mEq by mouth 2 (two) times daily.    ? Probiotic Product (PROBIOTIC-10 PO) Take 1 capsule by mouth daily.    ? sertraline (ZOLOFT) 25 MG tablet Take 25 mg by mouth daily.    ? SUMAtriptan (IMITREX) 50 MG tablet Take 1 tablet (50 mg total) by mouth every 2 (two) hours as needed for migraine. May repeat in 2 hours if headache persists or recurs. (Patient not taking: Reported on 10/07/2021) 10 tablet 0  ? tadalafil (CIALIS) 20 MG tablet Take 1 tablet (20 mg total) by mouth daily as needed. (Patient not taking: Reported on 10/04/2021) 10 tablet 5  ? Wheat Dextrin (BENEFIBER DRINK MIX PO) Take 1 oz by mouth daily. (Patient not taking: Reported on 10/07/2021)    ? ?No current facility-administered medications for this visit.  ? ? ?Allergies as of 10/07/2021 - Review Complete 10/07/2021  ?Allergen Reaction Noted  ? Latex Rash 03/25/2020  ? Meclizine  09/29/2021  ? ? ?Family History  ?Problem Relation Age of Onset  ? Aneurysm Mother   ?     aortic  ? Hypertension Mother   ? Headache Mother   ? Prostate cancer Father   ? Hypertension Father   ? Colon cancer Neg Hx   ? Colon polyps Neg Hx   ? ? ?Social History  ? ?Socioeconomic History  ? Marital status: Single  ?  Spouse name: Not on file  ? Number of children: 2  ? Years of education:  Not on file  ? Highest education level: 12th grade  ?Occupational History  ? Occupation: Custodian   ?  Employer: [employer]  ?Tobacco Use  ? Smoking status: Former  ?  Packs/day: 1.00  ?  Year

## 2021-10-07 ENCOUNTER — Encounter: Payer: Self-pay | Admitting: Gastroenterology

## 2021-10-07 ENCOUNTER — Ambulatory Visit: Payer: Medicare HMO | Admitting: Gastroenterology

## 2021-10-07 ENCOUNTER — Other Ambulatory Visit: Payer: Self-pay

## 2021-10-07 VITALS — BP 124/68 | HR 74 | Temp 97.5°F | Ht 67.0 in | Wt 209.6 lb

## 2021-10-07 DIAGNOSIS — K5900 Constipation, unspecified: Secondary | ICD-10-CM | POA: Diagnosis not present

## 2021-10-07 DIAGNOSIS — R112 Nausea with vomiting, unspecified: Secondary | ICD-10-CM | POA: Diagnosis not present

## 2021-10-07 DIAGNOSIS — K625 Hemorrhage of anus and rectum: Secondary | ICD-10-CM

## 2021-10-07 MED ORDER — OMEPRAZOLE 20 MG PO CPDR: 20.0000 mg | DELAYED_RELEASE_CAPSULE | Freq: Two times a day (BID) | ORAL | 3 refills | Status: DC

## 2021-10-07 NOTE — Patient Instructions (Signed)
To help with nausea: ?Start omeprazole 20 mg twice daily every day.  Take 30 minutes before breakfast and 30 minutes before dinner.  I have sent a prescription to your pharmacy. ?Do not eat within 3 hours of laying down. ?Try eating 4-6 small meals daily rather than 2 main meals. ?Avoid fried, fatty, greasy, spicy, citrus foods. ?Limit caffeine and carbonated beverages. ? ? ?To help with constipation: ?Continue MiraLAX 1-1.5 capfuls daily. ?Start Benefiber 3 teaspoons daily x2 weeks then increase to twice daily. ? ? ?We will follow-up with you in 3 months, but please call with a progress report in 4 weeks to let me know how you are doing.  As we discussed, if you continue to have a lot of nausea, we can do an upper endoscopy to evaluate further. ? ? ?It was good to see you again today! ? ?[name], PA-C ?[facility] ? ? ? ?

## 2021-10-21 DIAGNOSIS — M47812 Spondylosis without myelopathy or radiculopathy, cervical region: Secondary | ICD-10-CM | POA: Diagnosis not present

## 2021-10-27 ENCOUNTER — Other Ambulatory Visit: Payer: Self-pay | Admitting: Cardiovascular Disease

## 2021-10-27 NOTE — Telephone Encounter (Signed)
Prescription refill request for Eliquis received. ?Indication: Afib  ?Last office visit:02/28/21 Johnsie Cancel) ?Scr: 1.00 (07/14/21) ?Age: 72 ?Weight: 95.1kg ? ?Appropriate dose and refill sent to requested pharmacy.  ?

## 2021-11-02 DIAGNOSIS — G43711 Chronic migraine without aura, intractable, with status migrainosus: Secondary | ICD-10-CM | POA: Diagnosis not present

## 2021-11-03 ENCOUNTER — Telehealth: Payer: Self-pay

## 2021-11-03 NOTE — Telephone Encounter (Signed)
Patient states that he is having increased urination at night and is asking if he needs to stop the uraxatrol.  He denies any other symptoms.  Patient states that Dr. Alyson Ingles previously told him he could try taking the rx every other day.  Per Sharee Pimple patient can try decreasing to every other day to see if that helps and to call us back if the issue persists.  Patient aware and voiced understanding.  ?

## 2021-11-09 ENCOUNTER — Ambulatory Visit: Payer: Medicare HMO

## 2021-11-18 NOTE — Therapy (Incomplete)
?OUTPATIENT PHYSICAL THERAPY CERVICAL EVALUATION ? ? ?Patient Name: Steven [name] ?MRN: [id number] ?DOB:[birth year], 71 y.o., male ?Today's Date: 11/18/2021 ? ? ? ?Past Medical History:  ?Diagnosis Date  ? Anxiety   ? Arthritis   ? Atrial fibrillation ([place])   ? Atrial flutter ([place])   ? BPH (benign prostatic hyperplasia)   ? Chest pain   ? Dizziness   ? Dyspnea   ? laying down occ  ? Fracture 08/17/2015  ? MULTIPLE RIB FRACTURES     FROM FALL   ? GERD (gastroesophageal reflux disease)   ? Hemorrhoids   ? History of kidney stones   ? noted on CT scan  ? History of radiation therapy 08/22/11-10/13/11  ? prostate  ? Hyperlipemia   ? Hypertension   ? Hyperthyroidism   ? IBS (irritable bowel syndrome)   ? Insomnia   ? Light headedness   ? Migraine   ? Numbness and tingling in left arm   ? Numbness and tingling of both legs   ? Open fracture of left elbow 08/18/2015  ? Prostate cancer ([place])   ? prostate s/p radiation Mar 2013  ? Rib fractures 08/17/2015  ? Tinnitus   ? ?Past Surgical History:  ?Procedure Laterality Date  ? BOTOX INJECTION N/A 01/14/2020  ? Procedure: INJECTION OF BOTOX INTO ANAL SPHINCTER;  Surgeon: Ileana Roup, MD;  [facility];  Service: General;  Laterality: N/A;  ? COLONOSCOPY N/A 12/19/2012  ? OHY:WVPXTG 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 Normal appearing colon  ? COLONOSCOPY WITH PROPOFOL N/A 10/23/2019  ? Procedure: COLONOSCOPY WITH PROPOFOL;  Surgeon: Daneil Dolin, MD; external and grade 2 internal hemorrhoids, abnormal rectal blood vessels consistent with radiation proctitis s/p APC therapy, otherwise normal exam.  ? EVALUATION UNDER ANESTHESIA WITH ANAL FISTULECTOMY N/A 01/14/2020  ? Procedure: ANORECTAL EXAM UNDER ANESTHESIA;  Surgeon: Ileana Roup, MD;  [facility];  Service: General;  Laterality: N/A;  ? HOT HEMOSTASIS  10/23/2019  ? Procedure: HOT HEMOSTASIS (ARGON PLASMA COAGULATION/BICAP);  Surgeon: Daneil Dolin, MD;  [facility];  Service: Endoscopy;;  apc rectal proctitis  ?  ? KIDNEY SURGERY  1982  ? kidney tube collapse repair  ? RADIOACTIVE SEED IMPLANT    ? Prostate  ? ?Patient Active Problem List  ? Diagnosis Date Noted  ? Chronic pain 07/07/2020  ? Encounter to establish care 07/07/2020  ? Anxiety 07/07/2020  ? Carcinoma of prostate ([place]) 02/17/2020  ? Subacute thyroiditis 01/07/2020  ? Personal history of malignant neoplasm of prostate 12/12/2019  ? Benign prostatic hyperplasia with urinary obstruction 12/12/2019  ? Weak urinary stream 12/12/2019  ? Hyperthyroidism 11/24/2019  ? Weight loss 10/21/2019  ? IBS (irritable bowel syndrome) 10/21/2019  ? Dizzy spells 09/20/2019  ? Intractable chronic migraine without aura 09/20/2019  ? Obstructive sleep apnea syndrome 09/20/2019  ? Psychophysiologic insomnia 09/20/2019  ? Rectal pain 08/21/2019  ? Nausea and vomiting 05/30/2019  ? Atrial flutter ([place]) 04/17/2019  ? Chronic atrial fibrillation ([place]) 03/02/2019  ? HTN (hypertension) 03/02/2019  ? Constipation 08/02/2017  ? Hemorrhoids 08/02/2017  ? Fall 08/18/2015  ? Radiation proctitis 01/30/2013  ? Rectal bleeding 12/10/2012  ? Obese 05/30/2012  ? Malignant neoplasm of prostate ([place]) 08/13/2011  ? ? ?PCP: Redmond School, MD ? ?REFERRING PROVIDER: Duffy Rhody, MD  ? ?REFERRING DIAG: PT eval/tx for M47.812 spondylosis w/o myelopathy or radiculopathy , cervical region per Duffy Rhody, MD  ? ?  THERAPY DIAG:  ?No diagnosis found. ? ?ONSET DATE: *** ? ?SUBJECTIVE:                                                                                                                                                                                                        ? ?SUBJECTIVE STATEMENT: ?*** ? ?PERTINENT HISTORY:  ?*** ? ?PAIN:  ?Are you having pain? {OPRCPAIN:27236} ? ?PRECAUTIONS: {Therapy precautions:24002} ? ?WEIGHT BEARING RESTRICTIONS {Yes ***/No:24003} ? ?FALLS:  ?Has patient fallen in last 6  months? {fallsyesno:27318} ? ?LIVING ENVIRONMENT: ?Lives with: {OPRC lives with:25569::"lives with their family"} ?Lives in: {Lives in:25570} ?Stairs: {opstairs:27293} ?Has following equipment at home: {Assistive devices:23999} ? ?OCCUPATION: *** ? ?PLOF: {PLOF:24004} ? ?PATIENT GOALS *** ? ?OBJECTIVE:  ? ?DIAGNOSTIC FINDINGS:  ?*** ? ?PATIENT SURVEYS:  ?{rehab surveys:24030} ? ? ?COGNITION: ?Overall cognitive status: {cognition:24006} ? ? ?SENSATION: ?{sensation:27233} ? ?POSTURE:  ?*** ? ?PALPATION: ?***  ? ?CERVICAL ROM:  ? ?{AROM/PROM:27142} ROM A/PROM (deg) ?11/18/2021  ?Flexion   ?Extension   ?Right lateral flexion   ?Left lateral flexion   ?Right rotation   ?Left rotation   ? (Blank rows = not tested) ? ?UE ROM: ? ?{AROM/PROM:27142} ROM Right ?11/18/2021 Left ?11/18/2021  ?Shoulder flexion    ?Shoulder extension    ?Shoulder abduction    ?Shoulder adduction    ?Shoulder extension    ?Shoulder internal rotation    ?Shoulder external rotation    ?Elbow flexion    ?Elbow extension    ?Wrist flexion    ?Wrist extension    ?Wrist ulnar deviation    ?Wrist radial deviation    ?Wrist pronation    ?Wrist supination    ? (Blank rows = not tested) ? ?UE MMT: ? ?MMT Right ?11/18/2021 Left ?11/18/2021  ?Shoulder flexion    ?Shoulder extension    ?Shoulder abduction    ?Shoulder adduction    ?Shoulder extension    ?Shoulder internal rotation    ?Shoulder external rotation    ?Middle trapezius    ?Lower trapezius    ?Elbow flexion    ?Elbow extension    ?Wrist flexion    ?Wrist extension    ?Wrist ulnar deviation    ?Wrist radial deviation    ?Wrist pronation    ?Wrist supination    ?Grip strength    ? (Blank rows = not tested) ? ?CERVICAL SPECIAL TESTS:  ?{Cervical special tests:25246} ? ? ?FUNCTIONAL TESTS:  ?{Functional tests:24029} ? ?PATIENT SURVEYS:  ?{rehab surveys:24030:a} ? ?TODAY'S TREATMENT:  ?*** ? ? ?PATIENT EDUCATION:  ?Education details: *** ?Person educated: {  Person educated:25204} ?Education method: {Education  Method:25205} ?Education comprehension: {Education Comprehension:25206} ? ? ?HOME EXERCISE PROGRAM: ?*** ? ?ASSESSMENT: ? ?CLINICAL IMPRESSION: ?Patient is a *** y.o. *** who was seen today for physical therapy evaluation and treatment for ***.  ? ? ?OBJECTIVE IMPAIRMENTS {opptimpairments:25111}.  ? ?ACTIVITY LIMITATIONS {activity limitations:25113}.  ? ?PERSONAL FACTORS {Personal factors:25162} are also affecting patient's functional outcome.  ? ? ?REHAB POTENTIAL: {rehabpotential:25112} ? ?CLINICAL DECISION MAKING: {clinical decision making:25114} ? ?EVALUATION COMPLEXITY: {Evaluation complexity:25115} ? ? ?GOALS: ?Goals reviewed with patient? {yes/no:20286} ? ?SHORT TERM GOALS: Target date: {follow up:25551} ? ?*** ?Baseline: *** ?Goal status: {GOALSTATUS:25110} ? ?2.  *** ?Baseline: *** ?Goal status: {GOALSTATUS:25110} ? ?3.  *** ?Baseline: *** ?Goal status: {GOALSTATUS:25110} ? ?4.  *** ?Baseline: *** ?Goal status: {GOALSTATUS:25110} ? ?5.  *** ?Baseline: *** ?Goal status: {GOALSTATUS:25110} ? ?6.  *** ?Baseline: *** ?Goal status: {GOALSTATUS:25110} ? ?LONG TERM GOALS: Target date: {follow up:25551} ? ?*** ?Baseline: *** ?Goal status: {GOALSTATUS:25110} ? ?2.  *** ?Baseline: *** ?Goal status: {GOALSTATUS:25110} ? ?3.  *** ?Baseline: *** ?Goal status: {GOALSTATUS:25110} ? ?4.  *** ?Baseline: *** ?Goal status: {GOALSTATUS:25110} ? ?5.  *** ?Baseline: *** ?Goal status: {GOALSTATUS:25110} ? ?6.  *** ?Baseline: *** ?Goal status: {GOALSTATUS:25110} ? ? ?PLAN: ?PT FREQUENCY: {rehab frequency:25116} ? ?PT DURATION: {rehab duration:25117} ? ?PLANNED INTERVENTIONS: {rehab planned interventions:25118::"Therapeutic exercises","Therapeutic activity","Neuromuscular re-education","Balance training","Gait training","Patient/Family education","Joint mobilization"} ? ?PLAN FOR NEXT SESSION: *** ? ? ?Ardis Rowan, PT ?11/18/2021, 9:24 AM ? ? ? ? ? ?

## 2021-11-21 ENCOUNTER — Ambulatory Visit (HOSPITAL_COMMUNITY): Payer: Medicare HMO | Attending: Neurosurgery

## 2021-11-21 DIAGNOSIS — G43909 Migraine, unspecified, not intractable, without status migrainosus: Secondary | ICD-10-CM | POA: Diagnosis not present

## 2021-11-21 DIAGNOSIS — R29898 Other symptoms and signs involving the musculoskeletal system: Secondary | ICD-10-CM | POA: Insufficient documentation

## 2021-11-21 DIAGNOSIS — R7303 Prediabetes: Secondary | ICD-10-CM | POA: Diagnosis not present

## 2021-11-21 DIAGNOSIS — M542 Cervicalgia: Secondary | ICD-10-CM | POA: Insufficient documentation

## 2021-11-21 DIAGNOSIS — K5900 Constipation, unspecified: Secondary | ICD-10-CM | POA: Diagnosis not present

## 2021-11-21 DIAGNOSIS — E782 Mixed hyperlipidemia: Secondary | ICD-10-CM | POA: Diagnosis not present

## 2021-12-05 DIAGNOSIS — G43719 Chronic migraine without aura, intractable, without status migrainosus: Secondary | ICD-10-CM | POA: Diagnosis not present

## 2021-12-06 DIAGNOSIS — E876 Hypokalemia: Secondary | ICD-10-CM | POA: Diagnosis not present

## 2021-12-06 DIAGNOSIS — I10 Essential (primary) hypertension: Secondary | ICD-10-CM | POA: Diagnosis not present

## 2021-12-13 NOTE — Therapy (Addendum)
OUTPATIENT PHYSICAL THERAPY CERVICAL EVALUATION   Patient Name: Steven [name] MRN: [id number] DOB:[birth year], 71 y.o., male Today's Date: 12/16/2021   PT End of Session - 12/16/21 1047     Visit Number 1    Number of Visits 6    Date for PT Re-Evaluation 01/13/22    Authorization Type [employer]    Authorization Time Period auth req    Progress Note Due on Visit 10    PT Start Time 1040    PT Stop Time 1115    PT Time Calculation (min) 35 min             Past Medical History:  Diagnosis Date   Anxiety    Arthritis    Atrial fibrillation (HCC)    Atrial flutter (HCC)    BPH (benign prostatic hyperplasia)    Chest pain    Dizziness    Dyspnea    laying down occ   Fracture 08/17/2015   MULTIPLE RIB FRACTURES     FROM FALL    GERD (gastroesophageal reflux disease)    Hemorrhoids    History of kidney stones    noted on CT scan   History of radiation therapy 08/22/11-10/13/11   prostate   Hyperlipemia    Hypertension    Hyperthyroidism    IBS (irritable bowel syndrome)    Insomnia    Light headedness    Migraine    Numbness and tingling in left arm    Numbness and tingling of both legs    Open fracture of left elbow 08/18/2015   Prostate cancer [facility])    prostate s/p radiation Mar 2013   Rib fractures 08/17/2015   Tinnitus    Past Surgical History:  Procedure Laterality Date   BOTOX INJECTION N/A 01/14/2020   Procedure: INJECTION OF BOTOX INTO ANAL SPHINCTER;  Surgeon: Ileana Roup, MD;  Location: [facility];  Service: General;  Laterality: N/A;   COLONOSCOPY N/A 12/19/2012   [id number] 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 Normal appearing colon   COLONOSCOPY WITH PROPOFOL N/A 10/23/2019   Procedure: COLONOSCOPY WITH PROPOFOL;  Surgeon: Daneil Dolin, MD; external and grade 2 internal hemorrhoids, abnormal rectal blood vessels consistent with radiation proctitis s/p APC therapy,  otherwise normal exam.   EVALUATION UNDER ANESTHESIA WITH ANAL FISTULECTOMY N/A 01/14/2020   Procedure: ANORECTAL EXAM UNDER ANESTHESIA;  Surgeon: Ileana Roup, MD;  Location: [facility];  Service: General;  Laterality: N/A;   HOT HEMOSTASIS  10/23/2019   Procedure: HOT HEMOSTASIS (ARGON PLASMA COAGULATION/BICAP);  Surgeon: Daneil Dolin, MD;  [facility];  Service: Endoscopy;;  apc rectal proctitis     KIDNEY SURGERY  1982   kidney tube collapse repair   RADIOACTIVE SEED IMPLANT     Prostate   Patient Active Problem List   Diagnosis Date Noted   Chronic pain 07/07/2020   Encounter to establish care 07/07/2020   Anxiety 07/07/2020   Carcinoma of prostate ([place]) 02/17/2020   Subacute thyroiditis 01/07/2020   Personal history of malignant neoplasm of prostate 12/12/2019   Benign prostatic hyperplasia with urinary obstruction 12/12/2019   Weak urinary stream 12/12/2019   Hyperthyroidism 11/24/2019   Weight loss 10/21/2019   IBS (irritable bowel syndrome) 10/21/2019   Dizzy spells 09/20/2019   Intractable chronic migraine without aura 09/20/2019   Obstructive sleep apnea syndrome 09/20/2019   Psychophysiologic insomnia 09/20/2019   Rectal pain 08/21/2019   Nausea  and vomiting 05/30/2019   Atrial flutter ([place]) 04/17/2019   Chronic atrial fibrillation ([place]) 03/02/2019   HTN (hypertension) 03/02/2019   Constipation 08/02/2017   Hemorrhoids 08/02/2017   Fall 08/18/2015   Radiation proctitis 01/30/2013   Rectal bleeding 12/10/2012   Obese 05/30/2012   Malignant neoplasm of prostate ([place]) 08/13/2011    PCP: Redmond School, MD  REFERRING PROVIDER: Duffy Rhody, MD  REFERRING DIAG: PT eval/tx for [phone] spondylosis w/o myelopathy or radiculopathy , cervical region per Duffy Rhody, MD   THERAPY DIAG:  Neck pain  Other symptoms and signs involving the musculoskeletal system  Cervicalgia  Rationale for Evaluation and Treatment Rehabilitation  ONSET  DATE: a least a year  SUBJECTIVE:                                                                                                                                                                                                         SUBJECTIVE STATEMENT: Patient reports pain in his neck and other parts of his body; sharp pain.  States he was told he has neuropathy.  Seems to notice it the most at night when trying to go to sleep. Also may be having some occasional light headedness since was diagnosed with afib.   PERTINENT HISTORY:  Ringing in right ear afib  PAIN:  Are you having pain? No Pain at night; stabbing and burning; like a shock 4-5/10  PRECAUTIONS: None  WEIGHT BEARING RESTRICTIONS No  FALLS:  Has patient fallen in last 6 months? No  LIVING ENVIRONMENT: Lives with: lives with their spouse Lives in: House/apartment Stairs: Yes: External: 6 steps; on right going up, on left going up, and can reach both Has following equipment at home: Single point cane and Grab bars  OCCUPATION: retired  PLOF: Independent  PATIENT GOALS sleep better; less pain.  Decreased lightheadness  OBJECTIVE:   DIAGNOSTIC FINDINGS:  none  PATIENT SURVEYS:  FOTO 61   COGNITION: Overall cognitive status: Within functional limits for tasks assessed   SENSATION: WFL  POSTURE: rounded shoulders and forward head  PALPATION: Bilateral upper traps right > left   CERVICAL ROM:   Active ROM AROM (deg) eval  Flexion wfl  Extension 50% limited  Right lateral flexion 30  Left lateral flexion 34  Right rotation 52  Left rotation 55   (Blank rows = not tested)   UPPER EXTREMITY MMT:  MMT Right eval Left eval  Shoulder flexion 4+ 4+  Shoulder extension    Shoulder abduction    Shoulder adduction    Shoulder extension  Shoulder internal rotation    Shoulder external rotation    Middle trapezius 5 5  Lower trapezius    Elbow flexion 5 5  Elbow extension 5 5  Wrist  flexion    Wrist extension    Wrist ulnar deviation    Wrist radial deviation    Wrist pronation    Wrist supination    Grip strength 4+ 4+   (Blank rows = not tested)  TODAY'S TREATMENT:  Physical therapy evaluation, HEP   PATIENT EDUCATION:  Education details: HEP Person educated: Patient Education method: Consulting civil engineer, Media planner, and Handouts Education comprehension: verbalized understanding, returned demonstration, and needs further education   HOME EXERCISE PROGRAM: Access Code: [email or web address] URL: [email or web address] Date: 12/16/2021 Prepared by: [facility] - Rehab  Exercises - Seated Cervical Retraction  - 2 x daily - 7 x weekly - 1 sets - 10 reps - Seated Cervical Rotation AROM  - 2 x daily - 7 x weekly - 1 sets - 10 reps - Seated Cervical Sidebending AROM  - 2 x daily - 7 x weekly - 1 sets - 10 reps - Seated Cervical Flexion AROM  - 2 x daily - 7 x weekly - 1 sets - 10 reps - Seated Cervical Extension AROM  - 2 x daily - 7 x weekly - 1 sets - 10 reps  ASSESSMENT:  CLINICAL IMPRESSION: Patient is a 71 y.o. male who was seen today for physical therapy evaluation and treatment for cervical radiculopathy. Patient presents with impaired posture; pain ,decreased cervical mobility and mild upper extremity weakness that impair his ability to perform tasks around his home and impair his sleeping ability. Patient will benefit from skilled physical therapy interventions to address deficits and promote return to optimal function.    OBJECTIVE IMPAIRMENTS Abnormal gait, decreased activity tolerance, decreased balance, decreased endurance, decreased mobility, difficulty walking, decreased ROM, decreased strength, dizziness, hypomobility, impaired perceived functional ability, impaired flexibility, improper body mechanics, postural dysfunction, and pain.   ACTIVITY LIMITATIONS carrying, lifting, bending, sitting, standing, squatting, sleeping, stairs, transfers, bed mobility,  and reach over head  PARTICIPATION LIMITATIONS: meal prep, cleaning, laundry, driving, shopping, community activity, and occupation  PERSONAL FACTORS Age and Fitness are also affecting patient's functional outcome.   REHAB POTENTIAL: Good  CLINICAL DECISION MAKING: Evolving/moderate complexity  EVALUATION COMPLEXITY: Moderate   GOALS: Goals reviewed with patient? No  SHORT TERM GOALS: Target date: 12/30/2021   patient will be independent with initial HEP  Baseline:  Goal status: INITIAL  2.  Patient will improve cervical extension to 75% available to improve ability to look up to scan environment for safety. Baseline: 50% limited Goal status: INITIAL  LONG TERM GOALS: Target date: 01/13/2022  : Patient will be independent with advanced HEP and self management strategies to improve quality of life and functional outcomes.  Baseline:  Goal status: INITIAL  2.   Patient will improve FOTO score to predicted value to demonstrate improved functional mobility. Baseline: 61 Goal status: INITIAL  3.  Patient will report at least 50% improvement in overall symptoms and/or function to demonstrate improved functional mobility   Baseline:  Goal status: INITIAL     PLAN: PT FREQUENCY: 2x/week for 2 week then 1 x a week for 2 weeks  PT DURATION: 4 weeks  PLANNED INTERVENTIONS: Therapeutic exercises, Therapeutic activity, Neuromuscular re-education, Balance training, Gait training, Patient/Family education, Joint manipulation, Joint mobilization, Stair training, Vestibular training, Canalith repositioning, Visual/preceptual remediation/compensation, Orthotic/Fit training, DME instructions, Aquatic Therapy, Dry Needling, Cognitive remediation, Electrical stimulation,  Spinal manipulation, Spinal mobilization, Cryotherapy, Moist heat, Manual lymph drainage, Compression bandaging, scar mobilization, Taping, Traction, Ultrasound, Biofeedback, Manual therapy, and Re-evaluation  PLAN FOR  NEXT SESSION: Review goals and HEP; progress cervical mobility and postural stability; balance    12:06 PM, 12/16/21 Jazman Reuter Small Kevona Lupinacci MPT [facility] [place] [phone] QH:[phone]

## 2021-12-16 ENCOUNTER — Ambulatory Visit (HOSPITAL_COMMUNITY): Payer: Medicare HMO

## 2021-12-16 DIAGNOSIS — R29898 Other symptoms and signs involving the musculoskeletal system: Secondary | ICD-10-CM | POA: Diagnosis not present

## 2021-12-16 DIAGNOSIS — M542 Cervicalgia: Secondary | ICD-10-CM

## 2021-12-20 ENCOUNTER — Encounter (HOSPITAL_COMMUNITY): Payer: Self-pay

## 2021-12-20 ENCOUNTER — Ambulatory Visit (HOSPITAL_COMMUNITY): Payer: Medicare HMO | Attending: Internal Medicine

## 2021-12-20 DIAGNOSIS — R29898 Other symptoms and signs involving the musculoskeletal system: Secondary | ICD-10-CM

## 2021-12-20 DIAGNOSIS — M47812 Spondylosis without myelopathy or radiculopathy, cervical region: Secondary | ICD-10-CM | POA: Insufficient documentation

## 2021-12-20 DIAGNOSIS — M542 Cervicalgia: Secondary | ICD-10-CM

## 2021-12-20 NOTE — Therapy (Signed)
OUTPATIENT PHYSICAL THERAPY CERVICAL TREATMENT   Patient Name: Steven [name] MRN: [id number] DOB:08-Jan-1951, 71 y.o., male Today's Date: 12/20/2021   PT End of Session - 12/20/21 1629     Visit Number 2    Number of Visits 6    Date for PT Re-Evaluation 01/13/22    Authorization Type [employer] HMO    Progress Note Due on Visit 10    PT Start Time 1631             Past Medical History:  Diagnosis Date   Anxiety    Arthritis    Atrial fibrillation (HCC)    Atrial flutter (HCC)    BPH (benign prostatic hyperplasia)    Chest pain    Dizziness    Dyspnea    laying down occ   Fracture 08/17/2015   MULTIPLE RIB FRACTURES     FROM FALL    GERD (gastroesophageal reflux disease)    Hemorrhoids    History of kidney stones    noted on CT scan   History of radiation therapy 08/22/11-10/13/11   prostate   Hyperlipemia    Hypertension    Hyperthyroidism    IBS (irritable bowel syndrome)    Insomnia    Light headedness    Migraine    Numbness and tingling in left arm    Numbness and tingling of both legs    Open fracture of left elbow 08/18/2015   Prostate cancer [facility])    prostate s/p radiation Mar 2013   Rib fractures 08/17/2015   Tinnitus    Past Surgical History:  Procedure Laterality Date   BOTOX INJECTION N/A 01/14/2020   Procedure: INJECTION OF BOTOX INTO ANAL SPHINCTER;  Surgeon: Ileana Roup, MD;  [facility];  Service: General;  Laterality: N/A;   COLONOSCOPY N/A 12/19/2012   [id number] 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 Normal appearing colon   COLONOSCOPY WITH PROPOFOL N/A 10/23/2019   Procedure: COLONOSCOPY WITH PROPOFOL;  Surgeon: Daneil Dolin, MD; external and grade 2 internal hemorrhoids, abnormal rectal blood vessels consistent with radiation proctitis s/p APC therapy, otherwise normal exam.   EVALUATION UNDER ANESTHESIA WITH ANAL FISTULECTOMY N/A 01/14/2020   Procedure:  ANORECTAL EXAM UNDER ANESTHESIA;  Surgeon: Ileana Roup, MD;  Location: [facility];  Service: General;  Laterality: N/A;   HOT HEMOSTASIS  10/23/2019   Procedure: HOT HEMOSTASIS (ARGON PLASMA COAGULATION/BICAP);  Surgeon: Daneil Dolin, MD;  [facility];  Service: Endoscopy;;  apc rectal proctitis     KIDNEY SURGERY  1982   kidney tube collapse repair   RADIOACTIVE SEED IMPLANT     Prostate   Patient Active Problem List   Diagnosis Date Noted   Chronic pain 07/07/2020   Encounter to establish care 07/07/2020   Anxiety 07/07/2020   Carcinoma of prostate ([place]) 02/17/2020   Subacute thyroiditis 01/07/2020   Personal history of malignant neoplasm of prostate 12/12/2019   Benign prostatic hyperplasia with urinary obstruction 12/12/2019   Weak urinary stream 12/12/2019   Hyperthyroidism 11/24/2019   Weight loss 10/21/2019   IBS (irritable bowel syndrome) 10/21/2019   Dizzy spells 09/20/2019   Intractable chronic migraine without aura 09/20/2019   Obstructive sleep apnea syndrome 09/20/2019   Psychophysiologic insomnia 09/20/2019   Rectal pain 08/21/2019   Nausea and vomiting 05/30/2019   Atrial flutter ([place]) 04/17/2019   Chronic atrial fibrillation ([place]) 03/02/2019   HTN (hypertension) 03/02/2019   Constipation  08/02/2017   Hemorrhoids 08/02/2017   Fall 08/18/2015   Radiation proctitis 01/30/2013   Rectal bleeding 12/10/2012   Obese 05/30/2012   Malignant neoplasm of prostate ([place]) 08/13/2011    PCP: Redmond School, MD  REFERRING PROVIDER: Duffy Rhody, MD  REFERRING DIAG: PT eval/tx for [phone] spondylosis w/o myelopathy or radiculopathy , cervical region per Duffy Rhody, MD   THERAPY DIAG:  Neck pain  Other symptoms and signs involving the musculoskeletal system  Rationale for Evaluation and Treatment Rehabilitation  ONSET DATE: a least a year  SUBJECTIVE:                                                                                                                                                                                                          SUBJECTIVE STATEMENT: Pt late for apt today, stated he was on phone with MD.  Reports he has Lt sided neck pain, pain scale 4/10 achy sore pain.  Has began some of the exercises at home.  Stated he continues to have dizzy episodes daily.   PAIN:  Are you having pain? Yes:    PERTINENT HISTORY:  Ringing in right ear afib  PAIN:  NPRS scale: 4/10 Pain location: Lt side neck pain Pain description: sore achey pain Aggravating factors: laying down, sleeping Relieving factors: medication.   PRECAUTIONS: None  WEIGHT BEARING RESTRICTIONS No  FALLS:  Has patient fallen in last 6 months? No  LIVING ENVIRONMENT: Lives with: lives with their spouse Lives in: House/apartment Stairs: Yes: External: 6 steps; on right going up, on left going up, and can reach both Has following equipment at home: Single point cane and Grab bars  OCCUPATION: retired  PLOF: Independent  PATIENT GOALS sleep better; less pain.  Decreased lightheadness  OBJECTIVE:   DIAGNOSTIC FINDINGS:  none  PATIENT SURVEYS:  FOTO 61   COGNITION: Overall cognitive status: Within functional limits for tasks assessed   SENSATION: WFL  POSTURE: rounded shoulders and forward head  PALPATION: Bilateral upper traps right > left   CERVICAL ROM:   Active ROM AROM (deg) eval  Flexion wfl  Extension 50% limited  Right lateral flexion 30  Left lateral flexion 34  Right rotation 52  Left rotation 55   (Blank rows = not tested)   UPPER EXTREMITY MMT:  MMT Right eval Left eval  Shoulder flexion 4+ 4+  Shoulder extension    Shoulder abduction    Shoulder adduction    Shoulder extension    Shoulder internal rotation    Shoulder external rotation  Middle trapezius 5 5  Lower trapezius    Elbow flexion 5 5  Elbow extension 5 5  Wrist flexion    Wrist extension    Wrist ulnar deviation     Wrist radial deviation    Wrist pronation    Wrist supination    Grip strength 4+ 4+   (Blank rows = not tested)  TODAY'S TREATMENT:  12/20/21: Reviewed goals Educated importance of HEP Compliance  Educated importance of posture for pain control Seated:  3D cervical excursion all directions 10x  Cervical retraction 2 sets 10 reps  Wback 10x 5" holds   Physical therapy evaluation, HEP   PATIENT EDUCATION:  Education details: HEP Person educated: Patient Education method: Consulting civil engineer, Media planner, and Handouts Education comprehension: verbalized understanding, returned demonstration, and needs further education   HOME EXERCISE PROGRAM: Access Code: [email or web address] URL: [email or web address] Date: 12/16/2021 Prepared by: [facility] - Rehab  Exercises - Seated Cervical Retraction  - 2 x daily - 7 x weekly - 1 sets - 10 reps - Seated Cervical Rotation AROM  - 2 x daily - 7 x weekly - 1 sets - 10 reps - Seated Cervical Sidebending AROM  - 2 x daily - 7 x weekly - 1 sets - 10 reps - Seated Cervical Flexion AROM  - 2 x daily - 7 x weekly - 1 sets - 10 reps - Seated Cervical Extension AROM  - 2 x daily - 7 x weekly - 1 sets - 10 reps  ASSESSMENT:  CLINICAL IMPRESSION: Patient is a 71 y.o. male who was seen today for physical therapy evaluation and treatment for cervical radiculopathy. Patient presents with impaired posture; pain ,decreased cervical mobility and mild upper extremity weakness that impair his ability to perform tasks around his home and impair his sleeping ability. Patient will benefit from skilled physical therapy interventions to address deficits and promote return to optimal function.   Pt late for apt, unable to complete full POC today.  Reviewed goals, educated importance of HEP compliance for maximal benefits.  Pt presesnts in seated position with forward head and rounded shoulders.  Pt educated on importance of posture for pain control.  Encouraged pt to  complete all exercises sitting on edge of chair to reduce reliance on back of chair for increased musculature activation for maximal strengthening, verbalized understanding.    OBJECTIVE IMPAIRMENTS Abnormal gait, decreased activity tolerance, decreased balance, decreased endurance, decreased mobility, difficulty walking, decreased ROM, decreased strength, dizziness, hypomobility, impaired perceived functional ability, impaired flexibility, improper body mechanics, postural dysfunction, and pain.   ACTIVITY LIMITATIONS carrying, lifting, bending, sitting, standing, squatting, sleeping, stairs, transfers, bed mobility, and reach over head  PARTICIPATION LIMITATIONS: meal prep, cleaning, laundry, driving, shopping, community activity, and occupation  PERSONAL FACTORS Age and Fitness are also affecting patient's functional outcome.   REHAB POTENTIAL: Good  CLINICAL DECISION MAKING: Evolving/moderate complexity  EVALUATION COMPLEXITY: Moderate   GOALS: Goals reviewed with patient? No  SHORT TERM GOALS: Target date: 01/03/2022   patient will be independent with initial HEP  Baseline:  Goal status: Ongoing  2.  Patient will improve cervical extension to 75% available to improve ability to look up to scan environment for safety. Baseline: 50% limited Goal status: Ongoing  LONG TERM GOALS: Target date: 01/17/2022  : Patient will be independent with advanced HEP and self management strategies to improve quality of life and functional outcomes.  Baseline:  Goal status: Ongoing  2.   Patient will improve FOTO score to predicted value to  demonstrate improved functional mobility. Baseline: 61 Goal status: Ongoing  3.  Patient will report at least 50% improvement in overall symptoms and/or function to demonstrate improved functional mobility Baseline:  Goal status: Ongoing     PLAN: PT FREQUENCY: 2x/week for 2 week then 1 x a week for 2 weeks  PT DURATION: 4 weeks  PLANNED  INTERVENTIONS: Therapeutic exercises, Therapeutic activity, Neuromuscular re-education, Balance training, Gait training, Patient/Family education, Joint manipulation, Joint mobilization, Stair training, Vestibular training, Canalith repositioning, Visual/preceptual remediation/compensation, Orthotic/Fit training, DME instructions, Aquatic Therapy, Dry Needling, Cognitive remediation, Electrical stimulation, Spinal manipulation, Spinal mobilization, Cryotherapy, Moist heat, Manual lymph drainage, Compression bandaging, scar mobilization, Taping, Traction, Ultrasound, Biofeedback, Manual therapy, and Re-evaluation  PLAN FOR NEXT SESSION: progress cervical mobility and postural stability; balance   Ihor Austin, LPTA/CLT; CBIS [phone]  4:32 PM, 12/20/21

## 2021-12-21 DIAGNOSIS — I10 Essential (primary) hypertension: Secondary | ICD-10-CM | POA: Diagnosis not present

## 2021-12-21 DIAGNOSIS — I4891 Unspecified atrial fibrillation: Secondary | ICD-10-CM | POA: Diagnosis not present

## 2021-12-22 ENCOUNTER — Encounter (HOSPITAL_COMMUNITY): Payer: Medicare HMO

## 2021-12-22 DIAGNOSIS — G43719 Chronic migraine without aura, intractable, without status migrainosus: Secondary | ICD-10-CM | POA: Diagnosis not present

## 2021-12-22 DIAGNOSIS — R7309 Other abnormal glucose: Secondary | ICD-10-CM | POA: Diagnosis not present

## 2021-12-22 DIAGNOSIS — F5104 Psychophysiologic insomnia: Secondary | ICD-10-CM | POA: Diagnosis not present

## 2021-12-22 DIAGNOSIS — R42 Dizziness and giddiness: Secondary | ICD-10-CM | POA: Diagnosis not present

## 2021-12-22 DIAGNOSIS — G930 Cerebral cysts: Secondary | ICD-10-CM | POA: Diagnosis not present

## 2021-12-23 ENCOUNTER — Encounter (HOSPITAL_COMMUNITY): Payer: Self-pay

## 2021-12-23 ENCOUNTER — Ambulatory Visit (HOSPITAL_COMMUNITY): Payer: Medicare HMO | Attending: Internal Medicine

## 2021-12-23 DIAGNOSIS — R29898 Other symptoms and signs involving the musculoskeletal system: Secondary | ICD-10-CM

## 2021-12-23 DIAGNOSIS — M47812 Spondylosis without myelopathy or radiculopathy, cervical region: Secondary | ICD-10-CM | POA: Diagnosis not present

## 2021-12-23 DIAGNOSIS — M542 Cervicalgia: Secondary | ICD-10-CM | POA: Diagnosis not present

## 2021-12-23 NOTE — Therapy (Signed)
OUTPATIENT PHYSICAL THERAPY CERVICAL TREATMENT   Patient Name: Steven [name] MRN: [id number] DOB:04/20/1951, 71 y.o., male Today's Date: 12/23/2021   PT End of Session - 12/23/21 0907     Visit Number 3    Number of Visits 6    Date for PT Re-Evaluation 01/13/22    Authorization Type [employer] H[employer]    Progress Note Due on Visit 10    PT Start Time 0831    PT Stop Time 0910    PT Time Calculation (min) 39 min    Activity Tolerance Patient tolerated treatment well    Behavior During Therapy WFL for tasks assessed/performed              Past Medical History:  Diagnosis Date   Anxiety    Arthritis    Atrial fibrillation (HCC)    Atrial flutter (HCC)    BPH (benign prostatic hyperplasia)    Chest pain    Dizziness    Dyspnea    laying down occ   Fracture 08/17/2015   MULTIPLE RIB FRACTURES     FROM FALL    GERD (gastroesophageal reflux disease)    Hemorrhoids    History of kidney stones    noted on CT scan   History of radiation therapy 08/22/11-10/13/11   prostate   Hyperlipemia    Hypertension    Hyperthyroidism    IBS (irritable bowel syndrome)    Insomnia    Light headedness    Migraine    Numbness and tingling in left arm    Numbness and tingling of both legs    Open fracture of left elbow 08/18/2015   Prostate cancer [facility])    prostate s/p radiation Mar 2013   Rib fractures 08/17/2015   Tinnitus    Past Surgical History:  Procedure Laterality Date   BOTOX INJECTION N/A 01/14/2020   Procedure: INJECTION OF BOTOX INTO ANAL SPHINCTER;  Surgeon: Ileana Roup, MD;  Location: [facility];  Service: General;  Laterality: N/A;   COLONOSCOPY N/A 12/19/2012   [id number] 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 Normal appearing colon   COLONOSCOPY WITH PROPOFOL N/A 10/23/2019   Procedure: COLONOSCOPY WITH PROPOFOL;  Surgeon: Daneil Dolin, MD; external and grade 2 internal hemorrhoids, abnormal  rectal blood vessels consistent with radiation proctitis s/p APC therapy, otherwise normal exam.   EVALUATION UNDER ANESTHESIA WITH ANAL FISTULECTOMY N/A 01/14/2020   Procedure: ANORECTAL EXAM UNDER ANESTHESIA;  Surgeon: Ileana Roup, MD;  Location: [facility];  Service: General;  Laterality: N/A;   HOT HEMOSTASIS  10/23/2019   Procedure: HOT HEMOSTASIS (ARGON PLASMA COAGULATION/BICAP);  Surgeon: Daneil Dolin, MD;  [facility];  Service: Endoscopy;;  apc rectal proctitis     KIDNEY SURGERY  1982   kidney tube collapse repair   RADIOACTIVE SEED IMPLANT     Prostate   Patient Active Problem List   Diagnosis Date Noted   Chronic pain 07/07/2020   Encounter to establish care 07/07/2020   Anxiety 07/07/2020   Carcinoma of prostate ([place]) 02/17/2020   Subacute thyroiditis 01/07/2020   Personal history of malignant neoplasm of prostate 12/12/2019   Benign prostatic hyperplasia with urinary obstruction 12/12/2019   Weak urinary stream 12/12/2019   Hyperthyroidism 11/24/2019   Weight loss 10/21/2019   IBS (irritable bowel syndrome) 10/21/2019   Dizzy spells 09/20/2019   Intractable chronic migraine without aura 09/20/2019   Obstructive sleep apnea syndrome 09/20/2019  Psychophysiologic insomnia 09/20/2019   Rectal pain 08/21/2019   Nausea and vomiting 05/30/2019   Atrial flutter ([place]) 04/17/2019   Chronic atrial fibrillation ([place]) 03/02/2019   HTN (hypertension) 03/02/2019   Constipation 08/02/2017   Hemorrhoids 08/02/2017   Fall 08/18/2015   Radiation proctitis 01/30/2013   Rectal bleeding 12/10/2012   Obese 05/30/2012   Malignant neoplasm of prostate ([place]) 08/13/2011    PCP: Redmond School, MD  REFERRING PROVIDER: Duffy Rhody, MD  REFERRING DIAG: PT eval/tx for [phone] spondylosis w/o myelopathy or radiculopathy , cervical region per Duffy Rhody, MD   THERAPY DIAG:  Neck pain  Other symptoms and signs involving the musculoskeletal  system  Rationale for Evaluation and Treatment Rehabilitation  ONSET DATE: a least a year  SUBJECTIVE:                                                                                                                                                                                                         SUBJECTIVE STATEMENT: Pt stated his dizziness is bad today.  Reports his medication for BP, A-fib, blood thinner increases his fatigue and feel may be related to vertigo symptoms.  Current pain scale 3/10 both sides of neck, dull pain with stiffness.   PAIN:  Are you having pain? Yes:    PERTINENT HISTORY:  Ringing in right ear afib  PAIN:  NPRS scale: 3/10 Pain location: neck pain Pain description: dull Aggravating factors: laying down, sleeping Relieving factors: medication.   PRECAUTIONS: None  WEIGHT BEARING RESTRICTIONS No  FALLS:  Has patient fallen in last 6 months? No  LIVING ENVIRONMENT: Lives with: lives with their spouse Lives in: House/apartment Stairs: Yes: External: 6 steps; on right going up, on left going up, and can reach both Has following equipment at home: Single point cane and Grab bars  OCCUPATION: retired  PLOF: Independent  PATIENT GOALS sleep better; less pain.  Decreased lightheadness  OBJECTIVE:   DIAGNOSTIC FINDINGS:  none  PATIENT SURVEYS:  FOTO 61   COGNITION: Overall cognitive status: Within functional limits for tasks assessed   SENSATION: WFL  POSTURE: rounded shoulders and forward head  PALPATION: Bilateral upper traps right > left   CERVICAL ROM:   Active ROM AROM (deg) eval  Flexion wfl  Extension 50% limited  Right lateral flexion 30  Left lateral flexion 34  Right rotation 52  Left rotation 55   (Blank rows = not tested)   UPPER EXTREMITY MMT:  MMT Right eval Left eval  Shoulder flexion 4+ 4+  Shoulder extension  Shoulder abduction    Shoulder adduction    Shoulder extension    Shoulder  internal rotation    Shoulder external rotation    Middle trapezius 5 5  Lower trapezius    Elbow flexion 5 5  Elbow extension 5 5  Wrist flexion    Wrist extension    Wrist ulnar deviation    Wrist radial deviation    Wrist pronation    Wrist supination    Grip strength 4+ 4+   (Blank rows = not tested)  TODAY'S TREATMENT:  12/23/21 Seated: cervical retraction 2x 10  Thoracic excursion 10x each directions  Wback 15x Standing:  Corner pec stretch 3x 30"/ door way stretch  Cervical retraction 10x5"  Cervical retraction with UE flexion 10x 12/20/21: Reviewed goals Educated importance of HEP Compliance  Educated importance of posture for pain control Seated:  3D cervical excursion all directions 10x  Cervical retraction 2 sets 10 reps  Wback 10x 5" holds   Physical therapy evaluation, HEP   PATIENT EDUCATION:  Education details: HEP Person educated: Patient Education method: Consulting civil engineer, Media planner, and Handouts Education comprehension: verbalized understanding, returned demonstration, and needs further education   HOME EXERCISE PROGRAM: Access Code: [email or web address] URL: [email or web address] Date: 12/16/2021 Prepared by: [facility] - [facility]  Exercises - Seated Cervical Retraction  - 2 x daily - 7 x weekly - 1 sets - 10 reps - Seated Cervical Rotation AROM  - 2 x daily - 7 x weekly - 1 sets - 10 reps - Seated Cervical Sidebending AROM  - 2 x daily - 7 x weekly - 1 sets - 10 reps - Seated Cervical Flexion AROM  - 2 x daily - 7 x weekly - 1 sets - 10 reps - Seated Cervical Extension AROM  - 2 x daily - 7 x weekly - 1 sets - 10 reps  12/20/21: Darrin Luis 12/23/21: pec stretch  ASSESSMENT:  CLINICAL IMPRESSION: Session focus with cervical mobility and postural strengthening.  Pt presents with forward head and rounded shoulders.  Added thoracic excursion for spinal mobility.  Min cueing for form and mechanics with cervical retraction, pt with tendency to flex vs retract.   Pt with c/o vertigo symptoms with cervical movements, encouraged pt to discuss current medication with MD as pt mentioned dizziness are side effects.  EOS pt reports pain reduced to 2/10, decreased stiffness.    OBJECTIVE IMPAIRMENTS Abnormal gait, decreased activity tolerance, decreased balance, decreased endurance, decreased mobility, difficulty walking, decreased ROM, decreased strength, dizziness, hypomobility, impaired perceived functional ability, impaired flexibility, improper body mechanics, postural dysfunction, and pain.   ACTIVITY LIMITATIONS carrying, lifting, bending, sitting, standing, squatting, sleeping, stairs, transfers, bed mobility, and reach over head  PARTICIPATION LIMITATIONS: meal prep, cleaning, laundry, driving, shopping, community activity, and occupation  PERSONAL FACTORS Age and Fitness are also affecting patient's functional outcome.   REHAB POTENTIAL: Good  CLINICAL DECISION MAKING: Evolving/moderate complexity  EVALUATION COMPLEXITY: Moderate   GOALS: Goals reviewed with patient? No  SHORT TERM GOALS: Target date: 01/06/2022   patient will be independent with initial HEP  Baseline:  Goal status: Ongoing  2.  Patient will improve cervical extension to 75% available to improve ability to look up to scan environment for safety. Baseline: 50% limited Goal status: Ongoing  LONG TERM GOALS: Target date: 01/20/2022  : Patient will be independent with advanced HEP and self management strategies to improve quality of life and functional outcomes.  Baseline:  Goal status: Ongoing  2.   Patient will improve  FOTO score to predicted value to demonstrate improved functional mobility. Baseline: 61 Goal status: Ongoing  3.  Patient will report at least 50% improvement in overall symptoms and/or function to demonstrate improved functional mobility Baseline:  Goal status: Ongoing     PLAN: PT FREQUENCY: 2x/week for 2 week then 1 x a week for 2  weeks  PT DURATION: 4 weeks  PLANNED INTERVENTIONS: Therapeutic exercises, Therapeutic activity, Neuromuscular re-education, Balance training, Gait training, Patient/Family education, Joint manipulation, Joint mobilization, Stair training, Vestibular training, Canalith repositioning, Visual/preceptual remediation/compensation, Orthotic/Fit training, DME instructions, Aquatic Therapy, Dry Needling, Cognitive remediation, Electrical stimulation, Spinal manipulation, Spinal mobilization, Cryotherapy, Moist heat, Manual lymph drainage, Compression bandaging, scar mobilization, Taping, Traction, Ultrasound, Biofeedback, Manual therapy, and Re-evaluation  PLAN FOR NEXT SESSION: progress cervical mobility and postural stability; balance   Ihor Austin, LPTA/CLT; CBIS [phone]  9:18 AM, 12/23/21

## 2021-12-27 ENCOUNTER — Encounter (HOSPITAL_COMMUNITY): Payer: Self-pay | Admitting: Physical Therapy

## 2021-12-27 ENCOUNTER — Ambulatory Visit (HOSPITAL_COMMUNITY): Payer: Medicare HMO | Attending: Neurosurgery | Admitting: Physical Therapy

## 2021-12-27 DIAGNOSIS — M7918 Myalgia, other site: Secondary | ICD-10-CM | POA: Insufficient documentation

## 2021-12-27 DIAGNOSIS — G8929 Other chronic pain: Secondary | ICD-10-CM | POA: Insufficient documentation

## 2021-12-27 DIAGNOSIS — R42 Dizziness and giddiness: Secondary | ICD-10-CM | POA: Diagnosis not present

## 2021-12-27 DIAGNOSIS — M542 Cervicalgia: Secondary | ICD-10-CM | POA: Diagnosis not present

## 2021-12-27 DIAGNOSIS — R262 Difficulty in walking, not elsewhere classified: Secondary | ICD-10-CM | POA: Insufficient documentation

## 2021-12-27 DIAGNOSIS — M6281 Muscle weakness (generalized): Secondary | ICD-10-CM | POA: Diagnosis not present

## 2021-12-27 DIAGNOSIS — R29898 Other symptoms and signs involving the musculoskeletal system: Secondary | ICD-10-CM | POA: Insufficient documentation

## 2021-12-27 NOTE — Therapy (Signed)
OUTPATIENT PHYSICAL THERAPY CERVICAL TREATMENT   Patient Name: Steven [name] MRN: [id number] DOB:1950/10/10, 71 y.o., male Today's Date: 12/27/2021   PT End of Session - 12/27/21 0954     Visit Number 4    Number of Visits 6    Date for PT Re-Evaluation 01/13/22    Authorization Type [employer] HMO    Progress Note Due on Visit 10    PT Start Time [phone]   late check in   PT Stop Time 1026    PT Time Calculation (min) 31 min    Activity Tolerance Patient tolerated treatment well    Behavior During Therapy WFL for tasks assessed/performed              Past Medical History:  Diagnosis Date   Anxiety    Arthritis    Atrial fibrillation (HCC)    Atrial flutter (HCC)    BPH (benign prostatic hyperplasia)    Chest pain    Dizziness    Dyspnea    laying down occ   Fracture 08/17/2015   MULTIPLE RIB FRACTURES     FROM FALL    GERD (gastroesophageal reflux disease)    Hemorrhoids    History of kidney stones    noted on CT scan   History of radiation therapy 08/22/11-10/13/11   prostate   Hyperlipemia    Hypertension    Hyperthyroidism    IBS (irritable bowel syndrome)    Insomnia    Light headedness    Migraine    Numbness and tingling in left arm    Numbness and tingling of both legs    Open fracture of left elbow 08/18/2015   Prostate cancer [facility])    prostate s/p radiation Mar 2013   Rib fractures 08/17/2015   Tinnitus    Past Surgical History:  Procedure Laterality Date   BOTOX INJECTION N/A 01/14/2020   Procedure: INJECTION OF BOTOX INTO ANAL SPHINCTER;  Surgeon: Ileana Roup, MD;  Location: [facility];  Service: General;  Laterality: N/A;   COLONOSCOPY N/A 12/19/2012   [id number] 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 Normal appearing colon   COLONOSCOPY WITH PROPOFOL N/A 10/23/2019   Procedure: COLONOSCOPY WITH PROPOFOL;  Surgeon: Daneil Dolin, MD; external and grade 2 internal  hemorrhoids, abnormal rectal blood vessels consistent with radiation proctitis s/p APC therapy, otherwise normal exam.   EVALUATION UNDER ANESTHESIA WITH ANAL FISTULECTOMY N/A 01/14/2020   Procedure: ANORECTAL EXAM UNDER ANESTHESIA;  Surgeon: Ileana Roup, MD;  Location: [facility];  Service: General;  Laterality: N/A;   HOT HEMOSTASIS  10/23/2019   Procedure: HOT HEMOSTASIS (ARGON PLASMA COAGULATION/BICAP);  Surgeon: Daneil Dolin, MD;  [facility];  Service: Endoscopy;;  apc rectal proctitis     KIDNEY SURGERY  1982   kidney tube collapse repair   RADIOACTIVE SEED IMPLANT     Prostate   Patient Active Problem List   Diagnosis Date Noted   Chronic pain 07/07/2020   Encounter to establish care 07/07/2020   Anxiety 07/07/2020   Carcinoma of prostate ([place]) 02/17/2020   Subacute thyroiditis 01/07/2020   Personal history of malignant neoplasm of prostate 12/12/2019   Benign prostatic hyperplasia with urinary obstruction 12/12/2019   Weak urinary stream 12/12/2019   Hyperthyroidism 11/24/2019   Weight loss 10/21/2019   IBS (irritable bowel syndrome) 10/21/2019   Dizzy spells 09/20/2019   Intractable chronic migraine without aura 09/20/2019   Obstructive sleep  apnea syndrome 09/20/2019   Psychophysiologic insomnia 09/20/2019   Rectal pain 08/21/2019   Nausea and vomiting 05/30/2019   Atrial flutter ([place]) 04/17/2019   Chronic atrial fibrillation ([place]) 03/02/2019   HTN (hypertension) 03/02/2019   Constipation 08/02/2017   Hemorrhoids 08/02/2017   Fall 08/18/2015   Radiation proctitis 01/30/2013   Rectal bleeding 12/10/2012   Obese 05/30/2012   Malignant neoplasm of prostate ([place]) 08/13/2011    PCP: Redmond School, MD  REFERRING PROVIDER: Duffy Rhody, MD  REFERRING DIAG: PT eval/tx for [phone] spondylosis w/o myelopathy or radiculopathy , cervical region per Duffy Rhody, MD   THERAPY DIAG:  Cervicalgia  Rationale for Evaluation and Treatment  Rehabilitation  ONSET DATE: a least a year  SUBJECTIVE:                                                                                                                                                                                                         SUBJECTIVE STATEMENT: "Not too bad" Patient notes some stiffness in neck and LT shoulder today.   PAIN:  NPRS scale: 3/10 Pain location: neck pain Pain description: dull Aggravating factors: laying down, sleeping Relieving factors: medication.     PERTINENT HISTORY:  Ringing in right ear afib  PRECAUTIONS: None  WEIGHT BEARING RESTRICTIONS No  FALLS:  Has patient fallen in last 6 months? No  LIVING ENVIRONMENT: Lives with: lives with their spouse Lives in: House/apartment Stairs: Yes: External: 6 steps; on right going up, on left going up, and can reach both Has following equipment at home: Single point cane and Grab bars  OCCUPATION: retired  PLOF: Independent  PATIENT GOALS sleep better; less pain.  Decreased lightheadness  OBJECTIVE:   DIAGNOSTIC FINDINGS:  none  PATIENT SURVEYS:  FOTO 61   COGNITION: Overall cognitive status: Within functional limits for tasks assessed   SENSATION: WFL  POSTURE: rounded shoulders and forward head  PALPATION: Bilateral upper traps right > left   CERVICAL ROM:   Active ROM AROM (deg) eval  Flexion wfl  Extension 50% limited  Right lateral flexion 30  Left lateral flexion 34  Right rotation 52  Left rotation 55   (Blank rows = not tested)   UPPER EXTREMITY MMT:  MMT Right eval Left eval  Shoulder flexion 4+ 4+  Shoulder extension    Shoulder abduction    Shoulder adduction    Shoulder extension    Shoulder internal rotation    Shoulder external rotation    Middle trapezius 5 5  Lower trapezius    Elbow flexion 5  5  Elbow extension 5 5  Wrist flexion    Wrist extension    Wrist ulnar deviation    Wrist radial deviation    Wrist pronation     Wrist supination    Grip strength 4+ 4+   (Blank rows = not tested)  TODAY'S TREATMENT:  12/27/21 Chin tuck x15 Scap retraction x15  3D cervical excursion x 5 each 3D thoracic excursion x 5 each  Pec stretch in doorway 3 x 20" Band rows GTB x15 Shoulder extension GTB x15  12/23/21 Seated: cervical retraction 2x 10  Thoracic excursion 10x each directions  Wback 15x Standing:  Corner pec stretch 3x 30"/ door way stretch  Cervical retraction 10x5"  Cervical retraction with UE flexion 10x  12/20/21: Reviewed goals Educated importance of HEP Compliance  Educated importance of posture for pain control Seated:  3D cervical excursion all directions 10x  Cervical retraction 2 sets 10 reps  Wback 10x 5" holds   Physical therapy evaluation, HEP   PATIENT EDUCATION:  Education details: HEP Person educated: Patient Education method: Consulting civil engineer, Media planner, and Handouts Education comprehension: verbalized understanding, returned demonstration, and needs further education   HOME EXERCISE PROGRAM: Access Code: [email or web address] URL: [email or web address] Date: 12/16/2021 Prepared by: [facility] - Re[facility]  Exercises - Seated Cervical Retraction  - 2 x daily - 7 x weekly - 1 sets - 10 reps - Seated Cervical Rotation AROM  - 2 x daily - 7 x weekly - 1 sets - 10 reps - Seated Cervical Sidebending AROM  - 2 x daily - 7 x weekly - 1 sets - 10 reps - Seated Cervical Flexion AROM  - 2 x daily - 7 x weekly - 1 sets - 10 reps - Seated Cervical Extension AROM  - 2 x daily - 7 x weekly - 1 sets - 10 reps  12/20/21: Darrin Luis 12/23/21: pec stretch  12/27/21 Band rows Shoulder extension   ASSESSMENT:  CLINICAL IMPRESSION: Patient tolerates session well today. Progressed postural strengthening with added band rows and shoulder extension. Patient cued on proper hand placement. Also cued patient on proper position and body mechanics during pec stretch in doorway to reduce shoulder strain and  maximize benefit of stretch. Issued updated HEP handout. Patient will continue to benefit from skilled therapy services to reduce remaining deficits and improve functional ability.    OBJECTIVE IMPAIRMENTS Abnormal gait, decreased activity tolerance, decreased balance, decreased endurance, decreased mobility, difficulty walking, decreased ROM, decreased strength, dizziness, hypomobility, impaired perceived functional ability, impaired flexibility, improper body mechanics, postural dysfunction, and pain.   ACTIVITY LIMITATIONS carrying, lifting, bending, sitting, standing, squatting, sleeping, stairs, transfers, bed mobility, and reach over head  PARTICIPATION LIMITATIONS: meal prep, cleaning, laundry, driving, shopping, community activity, and occupation  PERSONAL FACTORS Age and Fitness are also affecting patient's functional outcome.   REHAB POTENTIAL: Good  CLINICAL DECISION MAKING: Evolving/moderate complexity  EVALUATION COMPLEXITY: Moderate   GOALS: Goals reviewed with patient? No  SHORT TERM GOALS: Target date: 01/10/2022   patient will be independent with initial HEP  Baseline:  Goal status: Ongoing  2.  Patient will improve cervical extension to 75% available to improve ability to look up to scan environment for safety. Baseline: 50% limited Goal status: Ongoing  LONG TERM GOALS: Target date: 01/24/2022  : Patient will be independent with advanced HEP and self management strategies to improve quality of life and functional outcomes.  Baseline:  Goal status: Ongoing  2.   Patient will improve FOTO score to predicted value  to demonstrate improved functional mobility. Baseline: 61 Goal status: Ongoing  3.  Patient will report at least 50% improvement in overall symptoms and/or function to demonstrate improved functional mobility Baseline:  Goal status: Ongoing     PLAN: PT FREQUENCY: 2x/week for 2 week then 1 x a week for 2 weeks  PT DURATION: 4  weeks  PLANNED INTERVENTIONS: Therapeutic exercises, Therapeutic activity, Neuromuscular re-education, Balance training, Gait training, Patient/Family education, Joint manipulation, Joint mobilization, Stair training, Vestibular training, Canalith repositioning, Visual/preceptual remediation/compensation, Orthotic/Fit training, DME instructions, Aquatic Therapy, Dry Needling, Cognitive remediation, Electrical stimulation, Spinal manipulation, Spinal mobilization, Cryotherapy, Moist heat, Manual lymph drainage, Compression bandaging, scar mobilization, Taping, Traction, Ultrasound, Biofeedback, Manual therapy, and Re-evaluation  PLAN FOR NEXT SESSION: progress cervical mobility and postural stability; balance   10:25 AM, 12/27/21 Josue Hector PT DPT  Physical Therapist with [facility]  [phone]

## 2021-12-29 ENCOUNTER — Encounter (HOSPITAL_COMMUNITY): Payer: Medicare HMO | Admitting: Physical Therapy

## 2021-12-30 ENCOUNTER — Encounter (HOSPITAL_COMMUNITY): Payer: Self-pay

## 2021-12-30 ENCOUNTER — Ambulatory Visit (HOSPITAL_COMMUNITY): Payer: Medicare HMO

## 2021-12-30 DIAGNOSIS — M542 Cervicalgia: Secondary | ICD-10-CM

## 2021-12-30 DIAGNOSIS — M7918 Myalgia, other site: Secondary | ICD-10-CM | POA: Diagnosis not present

## 2021-12-30 DIAGNOSIS — G8929 Other chronic pain: Secondary | ICD-10-CM | POA: Diagnosis not present

## 2021-12-30 DIAGNOSIS — R29898 Other symptoms and signs involving the musculoskeletal system: Secondary | ICD-10-CM | POA: Diagnosis not present

## 2021-12-30 DIAGNOSIS — R42 Dizziness and giddiness: Secondary | ICD-10-CM | POA: Diagnosis not present

## 2021-12-30 DIAGNOSIS — M6281 Muscle weakness (generalized): Secondary | ICD-10-CM | POA: Diagnosis not present

## 2021-12-30 DIAGNOSIS — R262 Difficulty in walking, not elsewhere classified: Secondary | ICD-10-CM | POA: Diagnosis not present

## 2021-12-30 NOTE — Therapy (Signed)
OUTPATIENT PHYSICAL THERAPY CERVICAL TREATMENT   Patient Name: Steven [name] MRN: [id number] DOB:[birth year], 71 y.o., male Today's Date: 12/30/2021   PT End of Session - 12/30/21 0825     Visit Number 5    Number of Visits 6    Date for PT Re-Evaluation 01/13/22    Authorization Type [employer] H[employer]    Progress Note Due on Visit 10    PT Start Time 0825    PT Stop Time 0900    PT Time Calculation (min) 35 min    Activity Tolerance Patient tolerated treatment well    Behavior During Therapy WFL for tasks assessed/performed              Past Medical History:  Diagnosis Date   Anxiety    Arthritis    Atrial fibrillation (HCC)    Atrial flutter (HCC)    BPH (benign prostatic hyperplasia)    Chest pain    Dizziness    Dyspnea    laying down occ   Fracture 08/17/2015   MULTIPLE RIB FRACTURES     FROM FALL    GERD (gastroesophageal reflux disease)    Hemorrhoids    History of kidney stones    noted on CT scan   History of radiation therapy 08/22/11-10/13/11   prostate   Hyperlipemia    Hypertension    Hyperthyroidism    IBS (irritable bowel syndrome)    Insomnia    Light headedness    Migraine    Numbness and tingling in left arm    Numbness and tingling of both legs    Open fracture of left elbow 08/18/2015   Prostate cancer [facility])    prostate s/p radiation Mar 2013   Rib fractures 08/17/2015   Tinnitus    Past Surgical History:  Procedure Laterality Date   BOTOX INJECTION N/A 01/14/2020   Procedure: INJECTION OF BOTOX INTO ANAL SPHINCTER;  Surgeon: Ileana Roup, MD;  Location: [facility];  Service: General;  Laterality: N/A;   COLONOSCOPY N/A 12/19/2012   [id number] 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 Normal appearing colon   COLONOSCOPY WITH PROPOFOL N/A 10/23/2019   Procedure: COLONOSCOPY WITH PROPOFOL;  Surgeon: Daneil Dolin, MD; external and grade 2 internal hemorrhoids, abnormal  rectal blood vessels consistent with radiation proctitis s/p APC therapy, otherwise normal exam.   EVALUATION UNDER ANESTHESIA WITH ANAL FISTULECTOMY N/A 01/14/2020   Procedure: ANORECTAL EXAM UNDER ANESTHESIA;  Surgeon: Ileana Roup, MD;  Location: [facility];  Service: General;  Laterality: N/A;   HOT HEMOSTASIS  10/23/2019   Procedure: HOT HEMOSTASIS (ARGON PLASMA COAGULATION/BICAP);  Surgeon: Daneil Dolin, MD;  [facility];  Service: Endoscopy;;  apc rectal proctitis     KIDNEY SURGERY  1982   kidney tube collapse repair   RADIOACTIVE SEED IMPLANT     Prostate   Patient Active Problem List   Diagnosis Date Noted   Chronic pain 07/07/2020   Encounter to establish care 07/07/2020   Anxiety 07/07/2020   Carcinoma of prostate ([place]) 02/17/2020   Subacute thyroiditis 01/07/2020   Personal history of malignant neoplasm of prostate 12/12/2019   Benign prostatic hyperplasia with urinary obstruction 12/12/2019   Weak urinary stream 12/12/2019   Hyperthyroidism 11/24/2019   Weight loss 10/21/2019   IBS (irritable bowel syndrome) 10/21/2019   Dizzy spells 09/20/2019   Intractable chronic migraine without aura 09/20/2019   Obstructive sleep apnea syndrome 09/20/2019  Psychophysiologic insomnia 09/20/2019   Rectal pain 08/21/2019   Nausea and vomiting 05/30/2019   Atrial flutter ([place]) 04/17/2019   Chronic atrial fibrillation ([place]) 03/02/2019   HTN (hypertension) 03/02/2019   Constipation 08/02/2017   Hemorrhoids 08/02/2017   Fall 08/18/2015   Radiation proctitis 01/30/2013   Rectal bleeding 12/10/2012   Obese 05/30/2012   Malignant neoplasm of prostate ([place]) 08/13/2011    PCP: Redmond School, MD  REFERRING PROVIDER: Duffy Rhody, MD  REFERRING DIAG: PT eval/tx for [phone] spondylosis w/o myelopathy or radiculopathy , cervical region per Duffy Rhody, MD   THERAPY DIAG:  Cervicalgia  Neck pain  Other symptoms and signs involving the musculoskeletal  system  Rationale for Evaluation and Treatment Rehabilitation  ONSET DATE: a least a year  SUBJECTIVE:                                                                                                                                                                                                         SUBJECTIVE STATEMENT: Patient reports that left shoulder was throbbing last night, through upper trap into entire shoulder. Pt states that neck is feeling ok.   PAIN:  NPRS scale: 2/10 Pain location: neck pain Pain description: dull Aggravating factors: laying down, sleeping Relieving factors: medication.     PERTINENT HISTORY:  Ringing in right ear afib  PRECAUTIONS: None  WEIGHT BEARING RESTRICTIONS No  FALLS:  Has patient fallen in last 6 months? No  LIVING ENVIRONMENT: Lives with: lives with their spouse Lives in: House/apartment Stairs: Yes: External: 6 steps; on right going up, on left going up, and can reach both Has following equipment at home: Single point cane and Grab bars  OCCUPATION: retired  PLOF: Independent  PATIENT GOALS sleep better; less pain.  Decreased lightheadness  OBJECTIVE:   DIAGNOSTIC FINDINGS:  none  PATIENT SURVEYS:  FOTO 61   COGNITION: Overall cognitive status: Within functional limits for tasks assessed   SENSATION: WFL  POSTURE: rounded shoulders and forward head  PALPATION: Bilateral upper traps right > left   CERVICAL ROM:   Active ROM AROM (deg) eval  Flexion wfl  Extension 50% limited  Right lateral flexion 30  Left lateral flexion 34  Right rotation 52  Left rotation 55   (Blank rows = not tested)   UPPER EXTREMITY MMT:  MMT Right eval Left eval  Shoulder flexion 4+ 4+  Shoulder extension    Shoulder abduction    Shoulder adduction    Shoulder extension    Shoulder internal rotation    Shoulder external rotation  Middle trapezius 5 5  Lower trapezius    Elbow flexion 5 5  Elbow extension 5  5  Wrist flexion    Wrist extension    Wrist ulnar deviation    Wrist radial deviation    Wrist pronation    Wrist supination    Grip strength 4+ 4+   (Blank rows = not tested)  TODAY'S TREATMENT:   12/30/21  Seated upper trap stretch 10 sec hold x5  Levator stretch 10 sec holds x5  Bar left shoulder IR  x5  Shoulder rolls x10  Cervical retraction with ball against wall x15 3 sec holds  Cervical retraction with rotation ball against wall x10  Seated shoulder pulleys into flexion x2 mins  STW for cervical spine and upper trap     12/27/21 Chin tuck x15 Scap retraction x15  3D cervical excursion x 5 each 3D thoracic excursion x 5 each  Pec stretch in doorway 3 x 20" Band rows GTB x15 Shoulder extension GTB x15  12/23/21 Seated: cervical retraction 2x 10  Thoracic excursion 10x each directions  Wback 15x Standing:  Corner pec stretch 3x 30"/ door way stretch  Cervical retraction 10x5"  Cervical retraction with UE flexion 10x  12/20/21: Reviewed goals Educated importance of HEP Compliance  Educated importance of posture for pain control Seated:  3D cervical excursion all directions 10x  Cervical retraction 2 sets 10 reps  Wback 10x 5" holds   Physical therapy evaluation, HEP   PATIENT EDUCATION:  Education details: HEP Person educated: Patient Education method: Consulting civil engineer, Media planner, and Handouts Education comprehension: verbalized understanding, returned demonstration, and needs further education   HOME EXERCISE PROGRAM: Access Code: [email or web address] URL: [email or web address] Date: 12/16/2021 Prepared by: [facility] - Rehab  Exercises - Seated Cervical Retraction  - 2 x daily - 7 x weekly - 1 sets - 10 reps - Seated Cervical Rotation AROM  - 2 x daily - 7 x weekly - 1 sets - 10 reps - Seated Cervical Sidebending AROM  - 2 x daily - 7 x weekly - 1 sets - 10 reps - Seated Cervical Flexion AROM  - 2 x daily - 7 x weekly - 1 sets - 10 reps - Seated  Cervical Extension AROM  - 2 x daily - 7 x weekly - 1 sets - 10 reps  12/20/21: Darrin Luis 12/23/21: pec stretch  12/27/21 Band rows Shoulder extension   ASSESSMENT:  CLINICAL IMPRESSION: Patient tolerates session well today.patient requires cueing for proper mechanics with cervical retractions. Attempted 1-2 reps of arm half circle with bar but reported elbow pain and exercise was discontinued. Pt with more rom restriction on right side with flexion as compared to left when first starting pulleys, by 2 mins in left and right moving equal ranges. Pt with increased tension in b/l upper traps. Patient will continue to benefit from skilled therapy services to reduce remaining deficits and improve functional ability.    OBJECTIVE IMPAIRMENTS Abnormal gait, decreased activity tolerance, decreased balance, decreased endurance, decreased mobility, difficulty walking, decreased ROM, decreased strength, dizziness, hypomobility, impaired perceived functional ability, impaired flexibility, improper body mechanics, postural dysfunction, and pain.   ACTIVITY LIMITATIONS carrying, lifting, bending, sitting, standing, squatting, sleeping, stairs, transfers, bed mobility, and reach over head  PARTICIPATION LIMITATIONS: meal prep, cleaning, laundry, driving, shopping, community activity, and occupation  PERSONAL FACTORS Age and Fitness are also affecting patient's functional outcome.   REHAB POTENTIAL: Good  CLINICAL DECISION MAKING: Evolving/moderate complexity  EVALUATION COMPLEXITY: Moderate   GOALS: Goals  reviewed with patient? No  SHORT TERM GOALS: Target date: 01/13/2022   patient will be independent with initial HEP  Baseline:  Goal status: Ongoing  2.  Patient will improve cervical extension to 75% available to improve ability to look up to scan environment for safety. Baseline: 50% limited Goal status: Ongoing  LONG TERM GOALS: Target date: 01/27/2022  : Patient will be independent with  advanced HEP and self management strategies to improve quality of life and functional outcomes.  Baseline:  Goal status: Ongoing  2.   Patient will improve FOTO score to predicted value to demonstrate improved functional mobility. Baseline: 61 Goal status: Ongoing  3.  Patient will report at least 50% improvement in overall symptoms and/or function to demonstrate improved functional mobility Baseline:  Goal status: Ongoing     PLAN: PT FREQUENCY: 2x/week for 2 week then 1 x a week for 2 weeks  PT DURATION: 4 weeks  PLANNED INTERVENTIONS: Therapeutic exercises, Therapeutic activity, Neuromuscular re-education, Balance training, Gait training, Patient/Family education, Joint manipulation, Joint mobilization, Stair training, Vestibular training, Canalith repositioning, Visual/preceptual remediation/compensation, Orthotic/Fit training, DME instructions, Aquatic Therapy, Dry Needling, Cognitive remediation, Electrical stimulation, Spinal manipulation, Spinal mobilization, Cryotherapy, Moist heat, Manual lymph drainage, Compression bandaging, scar mobilization, Taping, Traction, Ultrasound, Biofeedback, Manual therapy, and Re-evaluation  PLAN FOR NEXT SESSION: progress cervical mobility and postural stability; balance   8:26 AM, 12/30/21 Josue Hector PT DPT  Physical Therapist with [facility]  [phone]

## 2022-01-04 DIAGNOSIS — G43719 Chronic migraine without aura, intractable, without status migrainosus: Secondary | ICD-10-CM | POA: Diagnosis not present

## 2022-01-06 ENCOUNTER — Ambulatory Visit (HOSPITAL_COMMUNITY): Payer: Medicare HMO

## 2022-01-06 DIAGNOSIS — R42 Dizziness and giddiness: Secondary | ICD-10-CM | POA: Diagnosis not present

## 2022-01-06 DIAGNOSIS — M542 Cervicalgia: Secondary | ICD-10-CM

## 2022-01-06 DIAGNOSIS — M6281 Muscle weakness (generalized): Secondary | ICD-10-CM

## 2022-01-06 DIAGNOSIS — M7918 Myalgia, other site: Secondary | ICD-10-CM

## 2022-01-06 DIAGNOSIS — R262 Difficulty in walking, not elsewhere classified: Secondary | ICD-10-CM | POA: Diagnosis not present

## 2022-01-06 DIAGNOSIS — G8929 Other chronic pain: Secondary | ICD-10-CM | POA: Diagnosis not present

## 2022-01-06 DIAGNOSIS — R29898 Other symptoms and signs involving the musculoskeletal system: Secondary | ICD-10-CM

## 2022-01-06 NOTE — Therapy (Signed)
OUTPATIENT PHYSICAL THERAPY CERVICAL DISCHARGE  PHYSICAL THERAPY DISCHARGE SUMMARY  Visits from Start of Care: 6  Current functional level related to goals / functional outcomes: See below   Remaining deficits: See below   Education / Equipment: See below   Patient agrees to discharge. Patient goals were partially met. Patient is being discharged due to being pleased with the current functional level.    Patient Name: Steven [name] MRN: [id number] DOB:02-02-1951, 71 y.o., male Today's Date: 01/06/2022   PT End of Session - 01/06/22 0906     Visit Number 6    Number of Visits 6    Date for PT Re-Evaluation 01/13/22    Authorization Type [employer] HMO    Progress Note Due on Visit 10    PT Start Time 0905    PT Stop Time 0943    PT Time Calculation (min) 38 min    Activity Tolerance Patient tolerated treatment well    Behavior During Therapy [facility] for tasks assessed/performed               Past Medical History:  Diagnosis Date   Anxiety    Arthritis    Atrial fibrillation (HCC)    Atrial flutter (HCC)    BPH (benign prostatic hyperplasia)    Chest pain    Dizziness    Dyspnea    laying down occ   Fracture 08/17/2015   MULTIPLE RIB FRACTURES     FROM FALL    GERD (gastroesophageal reflux disease)    Hemorrhoids    History of kidney stones    noted on CT scan   History of radiation therapy 08/22/11-10/13/11   prostate   Hyperlipemia    Hypertension    Hyperthyroidism    IBS (irritable bowel syndrome)    Insomnia    Light headedness    Migraine    Numbness and tingling in left arm    Numbness and tingling of both legs    Open fracture of left elbow 08/18/2015   Prostate cancer [facility])    prostate s/p radiation Mar 2013   Rib fractures 08/17/2015   Tinnitus    Past Surgical History:  Procedure Laterality Date   BOTOX INJECTION N/A 01/14/2020   Procedure: INJECTION OF BOTOX INTO ANAL SPHINCTER;  Surgeon: Ileana Roup, MD;  [facility]: [facility];  Service: General;  Laterality: N/A;   COLONOSCOPY N/A 12/19/2012   [id number] 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 Normal appearing colon   COLONOSCOPY WITH PROPOFOL N/A 10/23/2019   Procedure: COLONOSCOPY WITH PROPOFOL;  Surgeon: Daneil Dolin, MD; external and grade 2 internal hemorrhoids, abnormal rectal blood vessels consistent with radiation proctitis s/p APC therapy, otherwise normal exam.   EVALUATION UNDER ANESTHESIA WITH ANAL FISTULECTOMY N/A 01/14/2020   Procedure: ANORECTAL EXAM UNDER ANESTHESIA;  Surgeon: Ileana Roup, MD;  [facility];  Service: General;  Laterality: N/A;   HOT HEMOSTASIS  10/23/2019   Procedure: HOT HEMOSTASIS (ARGON PLASMA COAGULATION/BICAP);  Surgeon: Daneil Dolin, MD;  [facility];  Service: Endoscopy;;  apc rectal proctitis     KIDNEY SURGERY  1982   kidney tube collapse repair   RADIOACTIVE SEED IMPLANT     Prostate   Patient Active Problem List   Diagnosis Date Noted   Chronic pain 07/07/2020   Encounter to establish care 07/07/2020   Anxiety 07/07/2020   Carcinoma of prostate ([place]) 02/17/2020   Subacute thyroiditis  01/07/2020   Personal history of malignant neoplasm of prostate 12/12/2019   Benign prostatic hyperplasia with urinary obstruction 12/12/2019   Weak urinary stream 12/12/2019   Hyperthyroidism 11/24/2019   Weight loss 10/21/2019   IBS (irritable bowel syndrome) 10/21/2019   Dizzy spells 09/20/2019   Intractable chronic migraine without aura 09/20/2019   Obstructive sleep apnea syndrome 09/20/2019   Psychophysiologic insomnia 09/20/2019   Rectal pain 08/21/2019   Nausea and vomiting 05/30/2019   Atrial flutter ([place]) 04/17/2019   Chronic atrial fibrillation ([place]) 03/02/2019   HTN (hypertension) 03/02/2019   Constipation 08/02/2017   Hemorrhoids 08/02/2017   Fall 08/18/2015   Radiation proctitis 01/30/2013   Rectal bleeding 12/10/2012    Obese 05/30/2012   Malignant neoplasm of prostate ([place]) 08/13/2011    PCP: Redmond School, MD  REFERRING PROVIDER: Duffy Rhody, MD  REFERRING DIAG: PT eval/tx for [phone] spondylosis w/o myelopathy or radiculopathy , cervical region per Duffy Rhody, MD   THERAPY DIAG:  Cervicalgia  Dizziness and giddiness  Neck pain  Difficulty in walking, not elsewhere classified  Other symptoms and signs involving the musculoskeletal system  Chronic buttock pain  Muscle weakness (generalized)  Rationale for Evaluation and Treatment Rehabilitation  ONSET DATE: a least a year  SUBJECTIVE:                                                                                                                                                                                                         SUBJECTIVE STATEMENT:  Patient reports he is sleeping better at night.  Less pain in neck and shoulder; reports some ringing in ear; "swooshing" right ear. About 20% better.    PAIN:  NPRS scale: 2/10 Pain location: neck pain Pain description: dull Aggravating factors: laying down, sleeping Relieving factors: medication.     PERTINENT HISTORY:  Ringing in right ear afib  PRECAUTIONS: None  WEIGHT BEARING RESTRICTIONS No  FALLS:  Has patient fallen in last 6 months? No  LIVING ENVIRONMENT: Lives with: lives with their spouse Lives in: House/apartment Stairs: Yes: External: 6 steps; on right going up, on left going up, and can reach both Has following equipment at home: Single point cane and Grab bars  OCCUPATION: retired  PLOF: Independent  PATIENT GOALS sleep better; less pain.  Decreased lightheadness  OBJECTIVE:   DIAGNOSTIC FINDINGS:  none  PATIENT SURVEYS:  FOTO 61   COGNITION: Overall cognitive status: Within functional limits for tasks assessed   SENSATION: WFL  POSTURE: rounded shoulders and forward head  PALPATION: Bilateral upper traps right >  left   CERVICAL ROM:   Active ROM AROM (deg) eval AROM Deg 01/06/22  Flexion wfl wfl  Extension 50% limited 75% available  Right lateral flexion 30 32  Left lateral flexion 34 40  Right rotation 52 60  Left rotation 55 65   (Blank rows = not tested)   UPPER EXTREMITY MMT:  MMT Right eval Left eval Right 01/06/22 Left 01/06/22  Shoulder flexion 4+ 4+    Shoulder extension      Shoulder abduction      Shoulder adduction      Shoulder extension      Shoulder internal rotation      Shoulder external rotation      Middle trapezius 5 5    Lower trapezius      Elbow flexion 5 5    Elbow extension 5 5    Wrist flexion      Wrist extension      Wrist ulnar deviation      Wrist radial deviation      Wrist pronation      Wrist supination      Grip strength 4+ 4+     (Blank rows = not tested)  TODAY'S TREATMENT:  01/06/22 Discharge visit FOTO 59 Review of HEP STW x 10 min to cervical spine and upper traps; no other therapy intervention perfromed during manual treatment     12/30/21  Seated upper trap stretch 10 sec hold x5  Levator stretch 10 sec holds x5  Bar left shoulder IR  x5  Shoulder rolls x10  Cervical retraction with ball against wall x15 3 sec holds  Cervical retraction with rotation ball against wall x10  Seated shoulder pulleys into flexion x2 mins  STW for cervical spine and upper trap     12/27/21 Chin tuck x15 Scap retraction x15  3D cervical excursion x 5 each 3D thoracic excursion x 5 each  Pec stretch in doorway 3 x 20" Band rows GTB x15 Shoulder extension GTB x15  12/23/21 Seated: cervical retraction 2x 10  Thoracic excursion 10x each directions  Wback 15x Standing:  Corner pec stretch 3x 30"/ door way stretch  Cervical retraction 10x5"  Cervical retraction with UE flexion 10x  12/20/21: Reviewed goals Educated importance of HEP Compliance  Educated importance of posture for pain control Seated:  3D cervical excursion all directions  10x  Cervical retraction 2 sets 10 reps  Wback 10x 5" holds   Physical therapy evaluation, HEP   PATIENT EDUCATION:  Education details: HEP Person educated: Patient Education method: Consulting civil engineer, Media planner, and Handouts Education comprehension: verbalized understanding, returned demonstration, and needs further education   HOME EXERCISE PROGRAM: Access Code: [email or web address] URL: [email or web address] Date: 12/16/2021 Prepared by: [facility] - Rehab  Exercises - Seated Cervical Retraction  - 2 x daily - 7 x weekly - 1 sets - 10 reps - Seated Cervical Rotation AROM  - 2 x daily - 7 x weekly - 1 sets - 10 reps - Seated Cervical Sidebending AROM  - 2 x daily - 7 x weekly - 1 sets - 10 reps - Seated Cervical Flexion AROM  - 2 x daily - 7 x weekly - 1 sets - 10 reps - Seated Cervical Extension AROM  - 2 x daily - 7 x weekly - 1 sets - 10 reps  12/20/21: Darrin Luis 12/23/21: pec stretch  12/27/21 Band rows Shoulder extension   ASSESSMENT:  CLINICAL IMPRESSION: Patient pleased with current functional level; he has noticeable improved cervical mobility  and decreased pain; continue with some tinnitus non related to therapy patient advised to follow up with ENT.  Discharge today and patient in agreement with discharge plan.   OBJECTIVE IMPAIRMENTS Abnormal gait, decreased activity tolerance, decreased balance, decreased endurance, decreased mobility, difficulty walking, decreased ROM, decreased strength, dizziness, hypomobility, impaired perceived functional ability, impaired flexibility, improper body mechanics, postural dysfunction, and pain.   ACTIVITY LIMITATIONS carrying, lifting, bending, sitting, standing, squatting, sleeping, stairs, transfers, bed mobility, and reach over head  PARTICIPATION LIMITATIONS: meal prep, cleaning, laundry, driving, shopping, community activity, and occupation  PERSONAL FACTORS Age and Fitness are also affecting patient's functional outcome.   REHAB  POTENTIAL: Good  CLINICAL DECISION MAKING: Evolving/moderate complexity  EVALUATION COMPLEXITY: Moderate   GOALS: Goals reviewed with patient? No  SHORT TERM GOALS: Target date: 01/20/2022   patient will be independent with initial HEP  Baseline:  Goal status: met  2.  Patient will improve cervical extension to 75% available to improve ability to look up to scan environment for safety. Baseline: 50% limited; 75% available 01/06/22 Goal status: met  LONG TERM GOALS: Target date: 02/03/2022  : Patient will be independent with advanced HEP and self management strategies to improve quality of life and functional outcomes.  Baseline:  Goal status: met  2.   Patient will improve FOTO score to predicted value to demonstrate improved functional mobility. Baseline: 61; 01/07/23 59% Goal status: Ongoing  3.  Patient will report at least 50% improvement in overall symptoms and/or function to demonstrate improved functional mobility Baseline: 01/06/22 20% better Goal status: Ongoing     PLAN: PT FREQUENCY: 2x/week for 2 week then 1 x a week for 2 weeks  PT DURATION: 4 weeks  PLANNED INTERVENTIONS: Therapeutic exercises, Therapeutic activity, Neuromuscular re-education, Balance training, Gait training, Patient/Family education, Joint manipulation, Joint mobilization, Stair training, Vestibular training, Canalith repositioning, Visual/preceptual remediation/compensation, Orthotic/Fit training, DME instructions, Aquatic Therapy, Dry Needling, Cognitive remediation, Electrical stimulation, Spinal manipulation, Spinal mobilization, Cryotherapy, Moist heat, Manual lymph drainage, Compression bandaging, scar mobilization, Taping, Traction, Ultrasound, Biofeedback, Manual therapy, and Re-evaluation  PLAN FOR NEXT SESSION: discharge  9:50 AM, 01/06/22 Micheal Murad Small Ishaq Maffei MPT [facility] [place] [phone] [phone]

## 2022-01-07 ENCOUNTER — Other Ambulatory Visit: Payer: Self-pay

## 2022-01-07 ENCOUNTER — Emergency Department (HOSPITAL_COMMUNITY): Payer: Medicare HMO

## 2022-01-07 ENCOUNTER — Emergency Department (HOSPITAL_COMMUNITY)
Admission: EM | Admit: 2022-01-07 | Discharge: 2022-01-07 | Disposition: A | Discharge status: Home / Self Care | Payer: Medicare HMO | Attending: Emergency Medicine | Admitting: Emergency Medicine

## 2022-01-07 ENCOUNTER — Encounter (HOSPITAL_COMMUNITY): Payer: Self-pay | Admitting: Emergency Medicine

## 2022-01-07 DIAGNOSIS — Z7901 Long term (current) use of anticoagulants: Secondary | ICD-10-CM | POA: Diagnosis not present

## 2022-01-07 DIAGNOSIS — R0789 Other chest pain: Secondary | ICD-10-CM | POA: Diagnosis not present

## 2022-01-07 DIAGNOSIS — I700 Atherosclerosis of aorta: Secondary | ICD-10-CM | POA: Diagnosis not present

## 2022-01-07 DIAGNOSIS — Z8546 Personal history of malignant neoplasm of prostate: Secondary | ICD-10-CM | POA: Insufficient documentation

## 2022-01-07 DIAGNOSIS — E039 Hypothyroidism, unspecified: Secondary | ICD-10-CM | POA: Insufficient documentation

## 2022-01-07 DIAGNOSIS — R079 Chest pain, unspecified: Secondary | ICD-10-CM | POA: Diagnosis not present

## 2022-01-07 DIAGNOSIS — Z91040 Latex allergy status: Secondary | ICD-10-CM | POA: Diagnosis not present

## 2022-01-07 DIAGNOSIS — I10 Essential (primary) hypertension: Secondary | ICD-10-CM | POA: Diagnosis not present

## 2022-01-07 DIAGNOSIS — H5213 Myopia, bilateral: Secondary | ICD-10-CM | POA: Diagnosis not present

## 2022-01-07 LAB — CBC
HCT: 40.3 % (ref 39.0–52.0)
Hemoglobin: 13.4 g/dL (ref 13.0–17.0)
MCH: 29.5 pg (ref 26.0–34.0)
MCHC: 33.3 g/dL (ref 30.0–36.0)
MCV: 88.8 fL (ref 80.0–100.0)
Platelets: 234 10*3/uL (ref 150–400)
RBC: 4.54 MIL/uL (ref 4.22–5.81)
RDW: 12.5 % (ref 11.5–15.5)
WBC: 4.2 10*3/uL (ref 4.0–10.5)
nRBC: 0 % (ref 0.0–0.2)

## 2022-01-07 LAB — BASIC METABOLIC PANEL
Anion gap: 6 (ref 5–15)
BUN: 13 mg/dL (ref 8–23)
CO2: 30 mmol/L (ref 22–32)
Calcium: 9.3 mg/dL (ref 8.9–10.3)
Chloride: 102 mmol/L (ref 98–111)
Creatinine, Ser: 1.07 mg/dL (ref 0.61–1.24)
GFR, Estimated: >60 mL/min (ref >60)
Glucose, Bld: 106 mg/dL — ABNORMAL HIGH (ref 70–99)
Potassium: 3.5 mmol/L (ref 3.5–5.1)
Sodium: 138 mmol/L (ref 135–145)

## 2022-01-07 LAB — TROPONIN I (HIGH SENSITIVITY)
Troponin I (High Sensitivity): 5 ng/L (ref <18)
Troponin I (High Sensitivity): 5 ng/L (ref <18)

## 2022-01-07 NOTE — Discharge Instructions (Signed)
Your work-up today was reassuring.  Please call your cardiologist, Dr. Johnsie Cancel to arrange a follow up appointment.  Return to the emergency department for any new or worsening symptoms.

## 2022-01-07 NOTE — ED Notes (Signed)
Dc instructions reviewed with pt no questions or concerns at this time. Will follow up with cardiology.

## 2022-01-07 NOTE — ED Triage Notes (Signed)
Patient c/o non-radiating left side chest pain. Denies any shortness of breath, nausea, vomiting, dizziness, or back pain. Per patient worse with palpation. Patient has hx of a-fib but denies any other cardiac hx. Patient also takes potassium pills x3 daily in which he has not taken today.

## 2022-01-07 NOTE — ED Provider Notes (Signed)
[facility] EMERGENCY DEPARTMENT Provider Note   CSN: [id number] Arrival date & time: 01/07/22  1240     History  Chief Complaint  Patient presents with   Chest Pain    Steven [name] is a 71 y.o. male.   Chest Pain Associated symptoms: no abdominal pain, no back pain, no cough, no dizziness, no fever, no headache, no nausea, no numbness, no palpitations, no shortness of breath, no vomiting and no weakness        Steven [name] is a 71 y.o. male past medical history of hypertension, atrial fibrillation, hypothyroidism, IBS, and prostate cancer.  He is currently anticoagulated for his atrial fibs with Eliquis who presents to the Emergency Department complaining of sharp stabbing left-sided chest pain that occurred this morning during breakfast.  Pain lasted approximately 15 seconds and spontaneously resolved.  He notes having two similar episodes upon arrival to the ER.  No pain currently.  Pain was nonradiating.  He states that he has been doing physical therapy recently which involves stretching of his neck and chest.  Denies known injury.  Also admits that he has IBS and is concerned that maybe his pain is related to an IBS flare but denies abdominal pain.  He denies any shortness of breath, arm neck or jaw pain, nausea or vomiting, dizziness or back pain.  Pain was localized underneath his left breast area.  He states the pain was worse with palpation to this area.  Home Medications Prior to Admission medications   Medication Sig Start Date End Date Taking? Authorizing Provider  alfuzosin (UROXATRAL) 10 MG 24 hr tablet Take 1 tablet (10 mg total) by mouth daily with breakfast. 04/06/21   [name], Candee Furbish, MD  amitriptyline (ELAVIL) 10 MG tablet Take 10 mg by mouth at bedtime as needed for sleep. 07/07/20   [provider]  amLODipine (NORVASC) 10 MG tablet Take 1 tablet by mouth daily. 12/08/20   [provider]  apixaban (ELIQUIS) 5 MG TABS tablet TAKE 1  TABLET(5 MG) BY MOUTH TWICE DAILY 10/27/21   Josue Hector, MD  carvedilol (COREG) 6.25 MG tablet Take 1 tablet (6.25 mg total) by mouth 2 (two) times daily. 07/04/21   Josue Hector, MD  Cholecalciferol (VITAMIN D3) 125 MCG (5000 UT) CAPS Take 1 capsule by mouth daily.     [provider]  clotrimazole-betamethasone (LOTRISONE) cream Apply 1 application topically 2 (two) times daily. 02/17/21   [provider]  Cyanocobalamin (B-12 PO) B12    [provider]  diazepam (VALIUM) 5 MG tablet Take 5 mg by mouth daily as needed for anxiety.    [provider]  Erenumab-aooe (AIMOVIG) 70 MG/ML SOAJ Inject 1 mL into the skin every 30 (thirty) days.    [provider]  fluticasone (FLONASE) 50 MCG/ACT nasal spray fluticasone propionate 50 mcg/actuation nasal spray,suspension  SHAKE LIQUID AND USE 1 SPRAY IN EACH NOSTRIL EVERY DAY    [provider]  gabapentin (NEURONTIN) 300 MG capsule Take 300 mg by mouth 3 (three) times daily. 02/24/20   [provider]  hydrochlorothiazide (HYDRODIURIL) 25 MG tablet Take 25 mg by mouth daily. 07/28/19   [provider]  losartan (COZAAR) 100 MG tablet Take 100 mg by mouth daily. 01/05/21   [provider]  minocycline (DYNACIN) 100 MG tablet minocycline 100 mg tablet  Take 1 tablet every 12 hours by oral route.    [provider]  NEXLETOL 180 MG TABS Take  180 mg by mouth daily. Pt taking 2-3 times a week 12/03/19   [provider]  omeprazole (PRILOSEC) 20 MG capsule Take 1 capsule (20 mg total) by mouth 2 (two) times daily before a meal. 10/07/21   Jodi Mourning, Tivis Ringer, PA-C  ondansetron (ZOFRAN ODT) 4 MG disintegrating tablet Take 1 tablet (4 mg total) by mouth every 8 (eight) hours as needed for nausea or vomiting. 06/08/20   Avegno, Darrelyn Hillock, FNP  polyethylene glycol (MIRALAX / GLYCOLAX) 17 g packet Miralax    [provider]  potassium chloride SA (KLOR-CON) 20  MEQ tablet Take 20 mEq by mouth 2 (two) times daily. 12/09/19   [provider]  Probiotic Product (PROBIOTIC-10 PO) Take 1 capsule by mouth daily.    [provider]  sertraline (ZOLOFT) 25 MG tablet Take 25 mg by mouth daily. 03/03/21   [provider]  SUMAtriptan (IMITREX) 50 MG tablet Take 1 tablet (50 mg total) by mouth every 2 (two) hours as needed for migraine. May repeat in 2 hours if headache persists or recurs. Patient not taking: Reported on 10/07/2021 03/05/21   Roemhildt, Lorin T, PA-C  tadalafil (CIALIS) 20 MG tablet Take 1 tablet (20 mg total) by mouth daily as needed. Patient not taking: Reported on 10/04/2021 09/20/20   Cleon Gustin, MD  Wheat Dextrin (BENEFIBER DRINK MIX PO) Take 1 oz by mouth daily. Patient not taking: Reported on 10/07/2021    [provider]      Allergies    Latex and Meclizine    Review of Systems   Review of Systems  Constitutional:  Negative for chills and fever.  Eyes:  Negative for visual disturbance.  Respiratory:  Negative for cough, chest tightness and shortness of breath.   Cardiovascular:  Positive for chest pain. Negative for palpitations and leg swelling.  Gastrointestinal:  Negative for abdominal pain, nausea and vomiting.  Genitourinary:  Negative for dysuria.  Musculoskeletal:  Negative for arthralgias and back pain.  Skin:  Negative for color change, rash and wound.  Neurological:  Negative for dizziness, weakness, numbness and headaches.    Physical Exam Updated Vital Signs BP 129/76 (BP Location: Right Arm)   Pulse 60   Temp 98.1 F (36.7 C) (Oral)   Resp 18   Ht '5\' 7"'$  (1.702 m)   Wt 98.4 kg   SpO2 99%   BMI 33.99 kg/m  Physical Exam Vitals and nursing note reviewed.  Constitutional:      General: He is not in acute distress.    Appearance: He is well-developed and normal weight. He is not ill-appearing or toxic-appearing.  Cardiovascular:     Rate and Rhythm: Normal rate and  regular rhythm.     Pulses: Normal pulses.  Pulmonary:     Effort: Pulmonary effort is normal.  Chest:     Chest wall: Tenderness (Mild tenderness to palpation of the anterior left chest wall just distal to the left breast.  No bony deformity or crepitus.) present.  Abdominal:     General: There is no distension.     Palpations: Abdomen is soft.     Tenderness: There is no abdominal tenderness.  Musculoskeletal:     Cervical back: Normal range of motion.     Right lower leg: No edema.     Left lower leg: No edema.  Skin:    General: Skin is warm.     Capillary Refill: Capillary refill takes less than 2 seconds.  Findings: No bruising, erythema or rash.  Neurological:     General: No focal deficit present.     Mental Status: He is alert.     Sensory: No sensory deficit.     Motor: No weakness.     ED Results / Procedures / Treatments   Labs (all labs ordered are listed, but only abnormal results are displayed) Labs Reviewed  BASIC METABOLIC PANEL - Abnormal; Notable for the following components:      Result Value   Glucose, Bld 106 (*)    All other components within normal limits  CBC  TROPONIN I (HIGH SENSITIVITY)  TROPONIN I (HIGH SENSITIVITY)    EKG EKG Interpretation  Date/Time:  Saturday January 07 2022 12:53:11 EDT Ventricular Rate:  60 PR Interval:  154 QRS Duration: 82 QT Interval:  410 QTC Calculation: 410 R Axis:   2 Text Interpretation: Normal sinus rhythm Normal ECG When compared with ECG of 14-Jul-2021 15:14, No significant change was found Confirmed by Milton Ferguson [phone]) on 01/07/2022 4:43:50 PM  Radiology DG Chest 2 View  Result Date: 01/07/2022 CLINICAL DATA:  71 year old male with history of chest pain. EXAM: CHEST - 2 VIEW COMPARISON:  Chest x-ray 07/14/2021. FINDINGS: Lung volumes are normal. No consolidative airspace disease. No pleural effusions. No pneumothorax. No pulmonary nodule or mass noted. Pulmonary vasculature and the  cardiomediastinal silhouette are within normal limits. Atherosclerosis in the thoracic aorta. IMPRESSION: 1.  No radiographic evidence of acute cardiopulmonary disease. 2. Aortic atherosclerosis. Electronically Signed   By: Vinnie Langton M.D.   On: 01/07/2022 13:32    Procedures Procedures    Medications Ordered in ED Medications - No data to display  ED Course/ Medical Decision Making/ A&P                           Medical Decision Making Patient here for evaluation of chest pain described as sharp and stabbing in quality and lasting 15 seconds.  Initial episode at 10 AM this morning.  Spontaneously resolved.  Endorses 2 similar episodes during ER visit.  No chest pain at present.  Pain is somewhat reproducible with focal palpation of the left anterior chest wall area.  No bony deformity or crepitus noted.  Differential would include atypical chest pain, musculoskeletal injury, PE, ACS although felt doubtful as pain is focal and reproduced with palpation.  No associated symptoms.  On recheck, vital signs reassuring.  No tachycardia tachypnea or hypoxia.  No chest pain at present.    Amount and/or Complexity of Data Reviewed Labs:     Details: Labs interpreted by me, no leukocytosis, hemoglobin reassuring.  Chemistries without derangement.  Delta troponin flat at 5 Radiology: ordered.    Details: Chest x-ray without acute cardiopulmonary process. ECG/medicine tests: ordered.    Details: EKG reviewed by Dr. Roderic Palau showing normal sinus rhythm and no change since prior tracing of 06/2021 Discussion of management or test interpretation with external provider(s): Patient here with brief episodic chest pain.  Pain is focal and reproducible with palpation to the chest wall.  No associated symptoms, I doubt ACS or PE.  Patient endorses recent physical therapy which involves stretching.  This could be musculoskeletal.  His work-up today has been reassuring.  He is pain-free without intervention,  I feel he is appropriate for discharge home he will follow-up closely with his cardiologist.  Return precautions were discussed.           Final Clinical  Impression(s) / ED Diagnoses Final diagnoses:  Atypical chest pain    Rx / DC Orders ED Discharge Orders     None         Kem Parkinson, PA-C 01/07/22 1737    Milton Ferguson, MD 01/09/22 1306

## 2022-01-09 DIAGNOSIS — Z0100 Encounter for examination of eyes and vision without abnormal findings: Secondary | ICD-10-CM | POA: Diagnosis not present

## 2022-01-09 NOTE — Progress Notes (Unsigned)
Referring Provider: Redmond School, MD Primary Care Physician:  Redmond School, MD Primary GI Physician: Dr. Gala Romney  No chief complaint on file.   HPI:   Steven [name] is a 71 y.o. male  with history of rectal pain in the setting of chronic anal fissure previously failed topical treatment and underwent Botox therapy with [place] surgery, ultimately referred to [facility] with recommendations to treat supportively with focus on magement of chronic constipation/IBS, previously with weight loss and diagnosed with subacute thyroiditis treated with a course of methimazole and weight loss resolved, presenting today follow-up of nausea and constipation.  Prior treatment of constipation has included colace, Trulance which caused diarrhea, and various doses of MiraLAX, and fiber.  Last seen in our office in March 2023. Reported waking with morning nausea most days x 3-4 months. No typical heartburn symptoms, but occasional indigestion. Eating within 1-2 hrs of laying down. Taking omeprazole occasionally in the morning which helps some. Tried daily, but didn't resolve the nausea. Noted 39 lbs weight gain over the last 19 months. Also reported incomplete bowel movements with 3-4 Bms daily. Stools were loose/mushy taking MiraALX 1-1.5 cups daily. Was taking metamucil last week and felt this helped, but stopped as bottle said don't take more than 7 days. Single episode of toilet tissue hematochezia.  For evaluation of nausea, offered EGD vs PPI therapy with dietary changes and patient preferred to hold off on EGD. Recommended starting omeprazole 20 mg twice daily, discussed GERD diet/lifestyle.  For constipation, recommended continuing MiraLAX and starting Benefiber 3 teaspoons daily x2 weeks, then increasing to twice daily.  Follow-up in 3 months.  Today:     Past Medical History:  Diagnosis Date   Anxiety    Arthritis    Atrial fibrillation (HCC)    Atrial flutter (HCC)    BPH  (benign prostatic hyperplasia)    Chest pain    Dizziness    Dyspnea    laying down occ   Fracture 08/17/2015   MULTIPLE RIB FRACTURES     FROM FALL    GERD (gastroesophageal reflux disease)    Hemorrhoids    History of kidney stones    noted on CT scan   History of radiation therapy 08/22/11-10/13/11   prostate   Hyperlipemia    Hypertension    Hyperthyroidism    IBS (irritable bowel syndrome)    Insomnia    Light headedness    Migraine    Numbness and tingling in left arm    Numbness and tingling of both legs    Open fracture of left elbow 08/18/2015   Prostate cancer [facility])    prostate s/p radiation Mar 2013   Rib fractures 08/17/2015   Tinnitus     Past Surgical History:  Procedure Laterality Date   BOTOX INJECTION N/A 01/14/2020   Procedure: INJECTION OF BOTOX INTO ANAL SPHINCTER;  Surgeon: Ileana Roup, MD;  Location: [facility] ORS;  Service: General;  Laterality: N/A;   COLONOSCOPY N/A 12/19/2012   PIR:JJOACZ 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 Normal appearing colon   COLONOSCOPY WITH PROPOFOL N/A 10/23/2019   Procedure: COLONOSCOPY WITH PROPOFOL;  Surgeon: Daneil Dolin, MD; external and grade 2 internal hemorrhoids, abnormal rectal blood vessels consistent with radiation proctitis s/p APC therapy, otherwise normal exam.   EVALUATION UNDER ANESTHESIA WITH ANAL FISTULECTOMY N/A 01/14/2020   Procedure: ANORECTAL EXAM UNDER ANESTHESIA;  Surgeon: Ileana Roup, MD;  Location: [facility] ORS;  Service: General;  Laterality: N/A;   HOT HEMOSTASIS  10/23/2019   Procedure: HOT HEMOSTASIS (ARGON PLASMA COAGULATION/BICAP);  Surgeon: Daneil Dolin, MD;  Location: [facility] END[facility] SUITE;  Service: Endoscopy;;  apc rectal proctitis     KIDNEY SURGERY  1982   kidney tube collapse repair   RADIOACTIVE SEED IMPLANT     Prostate    Current Outpatient Medications  Medication Sig Dispense Refill   alfuzosin (UROXATRAL) 10 MG 24  hr tablet Take 1 tablet (10 mg total) by mouth daily with breakfast. 90 tablet 3   amitriptyline (ELAVIL) 10 MG tablet Take 10 mg by mouth at bedtime as needed for sleep.     amLODipine (NORVASC) 10 MG tablet Take 1 tablet by mouth daily.     apixaban (ELIQUIS) 5 MG TABS tablet TAKE 1 TABLET(5 MG) BY MOUTH TWICE DAILY 60 tablet 5   carvedilol (COREG) 6.25 MG tablet Take 1 tablet (6.25 mg total) by mouth 2 (two) times daily. 180 tablet 3   Cholecalciferol (VITAMIN D3) 125 MCG (5000 UT) CAPS Take 1 capsule by mouth daily.      clotrimazole-betamethasone (LOTRISONE) cream Apply 1 application topically 2 (two) times daily.     Cyanocobalamin (B-12 PO) B12     diazepam (VALIUM) 5 MG tablet Take 5 mg by mouth daily as needed for anxiety.     Erenumab-aooe (AIMOVIG) 70 MG/ML SOAJ Inject 1 mL into the skin every 30 (thirty) days.     fluticasone (FLONASE) 50 MCG/ACT nasal spray fluticasone propionate 50 mcg/actuation nasal spray,suspension  SHAKE LIQUID AND USE 1 SPRAY IN EACH NOSTRIL EVERY DAY     gabapentin (NEURONTIN) 300 MG capsule Take 300 mg by mouth 3 (three) times daily.     hydrochlorothiazide (HYDRODIURIL) 25 MG tablet Take 25 mg by mouth daily.     losartan (COZAAR) 100 MG tablet Take 100 mg by mouth daily.     minocycline (DYNACIN) 100 MG tablet minocycline 100 mg tablet  Take 1 tablet every 12 hours by oral route.     NEXLETOL 180 MG TABS Take 180 mg by mouth daily. Pt taking 2-3 times a week     omeprazole (PRILOSEC) 20 MG capsule Take 1 capsule (20 mg total) by mouth 2 (two) times daily before a meal. 60 capsule 3   ondansetron (ZOFRAN ODT) 4 MG disintegrating tablet Take 1 tablet (4 mg total) by mouth every 8 (eight) hours as needed for nausea or vomiting. 30 tablet 0   polyethylene glycol (MIRALAX / GLYCOLAX) 17 g packet Miralax     potassium chloride SA (KLOR-CON) 20 MEQ tablet Take 20 mEq by mouth 2 (two) times daily.     Probiotic Product (PROBIOTIC-10 PO) Take 1 capsule by mouth  daily.     sertraline (ZOLOFT) 25 MG tablet Take 25 mg by mouth daily.     SUMAtriptan (IMITREX) 50 MG tablet Take 1 tablet (50 mg total) by mouth every 2 (two) hours as needed for migraine. May repeat in 2 hours if headache persists or recurs. (Patient not taking: Reported on 10/07/2021) 10 tablet 0   tadalafil (CIALIS) 20 MG tablet Take 1 tablet (20 mg total) by mouth daily as needed. (Patient not taking: Reported on 10/04/2021) 10 tablet 5   Wheat Dextrin (BENEFIBER DRINK MIX PO) Take 1 oz by mouth daily. (Patient not taking: Reported on 10/07/2021)     No current facility-administered medications for this visit.    Allergies as of 01/11/2022 -  Review Complete 01/07/2022  Allergen Reaction Noted   Latex Rash 03/25/2020   Meclizine  09/29/2021    Family History  Problem Relation Age of Onset   Aneurysm Mother        aortic   Hypertension Mother    Headache Mother    Prostate cancer Father    Hypertension Father    Colon cancer Neg Hx    Colon polyps Neg Hx     Social History   Socioeconomic History   Marital status: Single    Spouse name: Not on file   Number of children: 2   Years of education: Not on file   Highest education level: 12th grade  Occupational History   Occupation: Custodian     Employer: [employer]  Tobacco Use   Smoking status: Former    Packs/day: 1.00    Years: 20.00    Total pack years: 20.00    Types: Cigarettes    Quit date: 2020    Years since quitting: 3.4   Smokeless tobacco: Never   Tobacco comments:    Quit smoking x 2 years    07/2015   SOMETIIMES i USE VAPOR   Vaping Use   Vaping Use: Never used  Substance and Sexual Activity   Alcohol use: Never   Drug use: Never   Sexual activity: Not Currently  Other Topics Concern   Not on file  Social History Narrative   Lives with friend   Divorced   No caffeine   Social Determinants of Health   Financial Resource Strain: Low Risk  (11/03/2020)   Overall Financial Resource  Strain (CARDIA)    Difficulty of Paying Living Expenses: Not hard at all  Food Insecurity: No Food Insecurity (11/03/2020)   Hunger Vital Sign    Worried About Running Out of Food in the Last Year: Never true    [place] in the Last Year: Never true  Transportation Needs: No Transportation Needs (11/03/2020)   PRAPARE - Hydrologist (Medical): No    Lack of Transportation (Non-Medical): No  Physical Activity: Inactive (11/03/2020)   Exercise Vital Sign    Days of Exercise per Week: 0 days    Minutes of Exercise per Session: 0 min  Stress: No Stress Concern Present (11/03/2020)   [place]    Feeling of Stress : Not at all  Social Connections: Socially Isolated (11/03/2020)   Social Connection and Isolation Panel [NHANES]    Frequency of Communication with Friends and Family: Twice a week    Frequency of Social Gatherings with Friends and Family: Once a week    Attends Religious Services: Never    Marine scientist or Organizations: No    Attends Music therapist: Never    Marital Status: Divorced    Review of Systems: Gen: Denies fever, chills, anorexia. Denies fatigue, weakness, weight loss.  CV: Denies chest pain, palpitations, syncope, peripheral edema, and claudication. Resp: Denies dyspnea at rest, cough, wheezing, coughing up blood, and pleurisy. GI: Denies vomiting blood, jaundice, and fecal incontinence.   Denies dysphagia or odynophagia. Derm: Denies rash, itching, dry skin Psych: Denies depression, anxiety, memory loss, confusion. No homicidal or suicidal ideation.  Heme: Denies bruising, bleeding, and enlarged lymph nodes.  Physical Exam: There were no vitals taken for this visit. General:   Alert and oriented. No distress noted. Pleasant and cooperative.  Head:  Normocephalic  and atraumatic. Eyes:  Conjuctiva clear without scleral icterus. Heart:  S1,  S2 present without murmurs appreciated. Lungs:  Clear to auscultation bilaterally. No wheezes, rales, or rhonchi. No distress.  Abdomen:  +BS, soft, non-tender and non-distended. No rebound or guarding. No HSM or masses noted. Msk:  Symmetrical without gross deformities. Normal posture. Extremities:  Without edema. Neurologic:  Alert and  oriented x4 Psych:  Normal mood and affect.    Assessment:     Plan:  ***   [name], PA-C [facility] 01/11/2022

## 2022-01-10 ENCOUNTER — Encounter (HOSPITAL_COMMUNITY): Payer: Self-pay

## 2022-01-10 ENCOUNTER — Ambulatory Visit (HOSPITAL_COMMUNITY): Payer: Medicare HMO

## 2022-01-11 ENCOUNTER — Encounter: Payer: Self-pay | Admitting: Gastroenterology

## 2022-01-11 ENCOUNTER — Ambulatory Visit: Payer: Medicare HMO | Admitting: Gastroenterology

## 2022-01-11 VITALS — BP 153/69 | HR 84 | Temp 97.5°F | Ht 67.0 in | Wt 218.2 lb

## 2022-01-11 DIAGNOSIS — R110 Nausea: Secondary | ICD-10-CM

## 2022-01-11 DIAGNOSIS — K5900 Constipation, unspecified: Secondary | ICD-10-CM

## 2022-01-11 NOTE — Patient Instructions (Signed)
Continue taking omeprazole 20 mg every morning.  Continue your current bowel regimen including fiber Gummies, MiraLAX, probiotic as this is working well for you.  Follow-up with Dr. Gerarda Fraction and Dr. Merlene Laughter on ringing in your ear, headaches, intermittent dizziness.  Follow-up with Dr. Riley Kill on your recent episode of chest pain.  If you have worsening cold symptoms, fever, shortness of breath, please follow-up with PCP or proceed to urgent care or the ER.   It was good to see you again today!   We will see you back in 6 months.   [name], PA-C [facility]

## 2022-01-12 DIAGNOSIS — H9313 Tinnitus, bilateral: Secondary | ICD-10-CM | POA: Diagnosis not present

## 2022-01-12 DIAGNOSIS — Z0000 Encounter for general adult medical examination without abnormal findings: Secondary | ICD-10-CM | POA: Diagnosis not present

## 2022-01-12 DIAGNOSIS — K581 Irritable bowel syndrome with constipation: Secondary | ICD-10-CM | POA: Diagnosis not present

## 2022-01-12 DIAGNOSIS — I10 Essential (primary) hypertension: Secondary | ICD-10-CM | POA: Diagnosis not present

## 2022-01-12 DIAGNOSIS — E876 Hypokalemia: Secondary | ICD-10-CM | POA: Diagnosis not present

## 2022-01-12 DIAGNOSIS — E782 Mixed hyperlipidemia: Secondary | ICD-10-CM | POA: Diagnosis not present

## 2022-01-12 DIAGNOSIS — G43709 Chronic migraine without aura, not intractable, without status migrainosus: Secondary | ICD-10-CM | POA: Diagnosis not present

## 2022-01-12 DIAGNOSIS — Z1331 Encounter for screening for depression: Secondary | ICD-10-CM | POA: Diagnosis not present

## 2022-01-12 DIAGNOSIS — J069 Acute upper respiratory infection, unspecified: Secondary | ICD-10-CM | POA: Diagnosis not present

## 2022-01-20 DIAGNOSIS — I10 Essential (primary) hypertension: Secondary | ICD-10-CM | POA: Diagnosis not present

## 2022-01-20 DIAGNOSIS — I4891 Unspecified atrial fibrillation: Secondary | ICD-10-CM | POA: Diagnosis not present

## 2022-02-03 DIAGNOSIS — G43719 Chronic migraine without aura, intractable, without status migrainosus: Secondary | ICD-10-CM | POA: Diagnosis not present

## 2022-02-09 ENCOUNTER — Ambulatory Visit: Payer: Medicare HMO | Admitting: Psychiatry

## 2022-02-09 ENCOUNTER — Telehealth: Payer: Self-pay | Admitting: Psychiatry

## 2022-02-09 NOTE — Telephone Encounter (Signed)
Pt cancelled appt due to scheduling conflict. Will call back to reschedule

## 2022-02-09 NOTE — Progress Notes (Deleted)
   CC:  headaches  Follow-up Visit  Last visit: 10/04/21  Brief HPI: 71 year old male with a history of afib, HTN, OSA, hyperthyroidism, IBS, cervical stenosis who follows in clinic for chronic headaches, with cervicogenic and migrainous features.  At his last visit gabapentin was increased.  Interval History: ***Headaches*** Neck PT***  Headache days per month: *** Headache free days per month: *** Headache severity: ***  Current Headache Regimen: Preventative: *** Abortive: ***  # of doses of abortive medications per month: ***  Prior Therapies                                  Amitriptyline 10 mg QHS Topamax 100 mg daily - kidney stones Losartan 100 mg daily Carvedilol 6.25 mg daily Aimovig 70 mg  Gabapentin 300 mg TID Imitrex 50 mg PRN - side effects Maxalt 10 mg PRN Nurtec 75 mg PRN - lack of efficacy Valium Dramamine Flexeril Robaxin Zofran  Physical Exam:   Vital Signs: There were no vitals taken for this visit. GENERAL:  well appearing, in no acute distress, alert  SKIN:  Color, texture, turgor normal. No rashes or lesions HEAD:  Normocephalic/atraumatic. RESP: normal respiratory effort MSK:  No gross joint deformities.   NEUROLOGICAL: Mental Status: Alert, oriented to person, place and time, Follows commands, and Speech fluent and appropriate. Cranial Nerves: PERRL, face symmetric, no dysarthria, hearing grossly intact Motor: moves all extremities equally Gait: normal-based.  IMPRESSION: ***  PLAN: ***   Follow-up: ***  I spent a total of *** minutes on the date of the service. Headache education was done. Discussed lifestyle modification including increased oral hydration, decreased caffeine, exercise and stress management. Discussed treatment options including preventive and acute medications, natural supplements, and infusion therapy. Discussed medication overuse headache and to limit use of acute treatments to no more than 2 days/week or  10 days/month. Discussed medication side effects, adverse reactions and drug interactions. Written educational materials and patient instructions outlining all of the above were given.  Genia Harold, MD

## 2022-02-27 NOTE — Progress Notes (Signed)
Office Visit    Patient Name: Steven [name] Date of Encounter: 03/03/2022  Primary Care Provider:  Redmond School, MD Primary Cardiologist:  Jenkins Rouge, MD Primary Electrophysiologist: None  Chief Complaint    Steven [name] is a 71 y.o. male with PMH of paroxysmal atrial fibrillation/flutter (on Eliquis), HTN, HLD, prostate CA s/p radiation 2013 who presents today for follow-up of recent ED visit for chest pain.  Past Medical History    Past Medical History:  Diagnosis Date   Anxiety    Arthritis    Atrial fibrillation (HCC)    Atrial flutter (HCC)    BPH (benign prostatic hyperplasia)    Chest pain    Dizziness    Dyspnea    laying down occ   Fracture 08/17/2015   MULTIPLE RIB FRACTURES     FROM FALL    GERD (gastroesophageal reflux disease)    Hemorrhoids    History of kidney stones    noted on CT scan   History of radiation therapy 08/22/11-10/13/11   prostate   Hyperlipemia    Hypertension    Hyperthyroidism    IBS (irritable bowel syndrome)    Insomnia    Light headedness    Migraine    Numbness and tingling in left arm    Numbness and tingling of both legs    Open fracture of left elbow 08/18/2015   Prostate cancer [facility])    prostate s/p radiation Mar 2013   Rib fractures 08/17/2015   Tinnitus    Past Surgical History:  Procedure Laterality Date   BOTOX INJECTION N/A 01/14/2020   Procedure: INJECTION OF BOTOX INTO ANAL SPHINCTER;  Surgeon: Ileana Roup, MD;  Location: [facility];  Service: General;  Laterality: N/A;   COLONOSCOPY N/A 12/19/2012   [id number] 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 Normal appearing colon   COLONOSCOPY WITH PROPOFOL N/A 10/23/2019   Procedure: COLONOSCOPY WITH PROPOFOL;  Surgeon: Daneil Dolin, MD; external and grade 2 internal hemorrhoids, abnormal rectal blood vessels consistent with radiation proctitis s/p APC therapy, otherwise normal exam.    EVALUATION UNDER ANESTHESIA WITH ANAL FISTULECTOMY N/A 01/14/2020   Procedure: ANORECTAL EXAM UNDER ANESTHESIA;  Surgeon: Ileana Roup, MD;  Location: [facility];  Service: General;  Laterality: N/A;   HOT HEMOSTASIS  10/23/2019   Procedure: HOT HEMOSTASIS (ARGON PLASMA COAGULATION/BICAP);  Surgeon: Daneil Dolin, MD;  [facility];  Service: Endoscopy;;  apc rectal proctitis     KIDNEY SURGERY  1982   kidney tube collapse repair   RADIOACTIVE SEED IMPLANT     Prostate    Allergies  Allergies  Allergen Reactions   Latex Rash    Possible reaction to latex gloves per patient   Meclizine     Other reaction(s): Abdominal Pain, Other    History of Present Illness    Steven [name] is a 71 year old male with above-mentioned past medical history who presents today for follow-up of chest pain and recent ED visit.  He was originally seen by Dr. Jesse Fall consultation in 2013 with complaint of heart fluttering and dyspnea.  He was set up for stress echo that showed a negative result with no significant ST elevation.  He was seen in the ED on 02/2019 with RVR.  Patient was unaware he was in atrial fibrillation and troponins were elevated due to demand ischemia and patient.  2D echo 02/2019 with EF of 60-65%, no  valve disease noted.  He was started on Xarelto for CHA2DS2-VASc score of 2.  Cardiac event monitor worn 03/2019 with normal sinus rhythm and no significant arrhythmias, no paroxysmal A-fib.  He was seen in 04/2019 for admission for recurrent AF in the setting of hypokalemia.  His HCTZ was discontinued.  Nuclear stress test was performed 06/2019 EF of 60%, low risk with no ischemia,very small apical anterior septal defect.  He was seen in follow-up on 11/2019 and Xarelto was changed to Eliquis due to episodes of hemoptysis.  He was reportedly taking his losartan intermittently.  He was last seen by Dr. Johnsie Cancel on 02/2021 for follow-up of paroxysmal atrial fibrillation.  During visit  patient was still endorsing dizziness no improvement with decreasing Lopressor.  Pressure was well controlled with no chest pain complaints at that time.  Steven [name] presents today for 1 year follow-up and recent ED visit.  He reports that he has not had any return of chest pain or shortness of breath since his last incident in June. He reports that he has been doing well overall and does report some weakness that he feels is related to deconditioning.  He denies any side effects to his current medication regimen and blood pressure today was well-controlled at 128/68.  He reports no episodes of atrial fibrillation and denies any bleeding with his current NOAC.  Patient denies chest pain, palpitations, dyspnea, PND, orthopnea, nausea, vomiting, dizziness, syncope, edema, weight gain, or early satiety.  Home Medications    Current Outpatient Medications  Medication Sig Dispense Refill   alfuzosin (UROXATRAL) 10 MG 24 hr tablet Take 1 tablet (10 mg total) by mouth daily with breakfast. 90 tablet 3   amitriptyline (ELAVIL) 10 MG tablet Take 10 mg by mouth at bedtime as needed for sleep.     amLODipine (NORVASC) 10 MG tablet Take 1 tablet by mouth daily.     apixaban (ELIQUIS) 5 MG TABS tablet TAKE 1 TABLET(5 MG) BY MOUTH TWICE DAILY 60 tablet 5   carvedilol (COREG) 6.25 MG tablet Take 1 tablet (6.25 mg total) by mouth 2 (two) times daily. 180 tablet 3   Cholecalciferol (VITAMIN D3) 125 MCG (5000 UT) CAPS Take 1 capsule by mouth daily.      clotrimazole-betamethasone (LOTRISONE) cream Apply 1 application topically 2 (two) times daily.     Cyanocobalamin (B-12 PO) B12     diazepam (VALIUM) 5 MG tablet Take 5 mg by mouth daily as needed for anxiety.     Erenumab-aooe (AIMOVIG) 70 MG/ML SOAJ Inject 1 mL into the skin every 30 (thirty) days.     fluticasone (FLONASE) 50 MCG/ACT nasal spray fluticasone propionate 50 mcg/actuation nasal spray,suspension  SHAKE LIQUID AND USE 1 SPRAY IN EACH NOSTRIL EVERY DAY      gabapentin (NEURONTIN) 300 MG capsule Take 300 mg by mouth 3 (three) times daily.     hydrochlorothiazide (HYDRODIURIL) 25 MG tablet Take 25 mg by mouth daily.     losartan (COZAAR) 100 MG tablet Take 100 mg by mouth daily.     minocycline (DYNACIN) 100 MG tablet minocycline 100 mg tablet  Take 1 tablet every 12 hours by oral route.     NEXLETOL 180 MG TABS Take 180 mg by mouth daily. Pt taking 2-3 times a week     omeprazole (PRILOSEC) 20 MG capsule Take 1 capsule (20 mg total) by mouth 2 (two) times daily before a meal. 60 capsule 3   ondansetron (ZOFRAN ODT) 4 MG disintegrating tablet  Take 1 tablet (4 mg total) by mouth every 8 (eight) hours as needed for nausea or vomiting. 30 tablet 0   polyethylene glycol (MIRALAX / GLYCOLAX) 17 g packet Miralax     potassium chloride SA (KLOR-CON) 20 MEQ tablet Take 20 mEq by mouth 2 (two) times daily.     Probiotic Product (PROBIOTIC-10 PO) Take 1 capsule by mouth daily.     tadalafil (CIALIS) 20 MG tablet Take 1 tablet (20 mg total) by mouth daily as needed. 10 tablet 5   No current facility-administered medications for this visit.     Review of Systems  Please see the history of present illness.    (+) Fatigue (+) Left lateral pectoral pain  All other systems reviewed and are otherwise negative except as noted above.  Physical Exam    Wt Readings from Last 3 Encounters:  03/03/22 218 lb 9.6 oz (99.2 kg)  01/11/22 218 lb 3.2 oz (99 kg)  01/07/22 217 lb (98.4 kg)   VS: Vitals:   03/03/22 0932  BP: 128/68  Pulse: 60  SpO2: 98%  ,Body mass index is 34.24 kg/m.  Constitutional:      Appearance: Healthy appearance. Not in distress.  Neck:     Vascular: JVD normal.  Pulmonary:     Effort: Pulmonary effort is normal.     Breath sounds: No wheezing. No rales. Diminished in the bases Cardiovascular:     Normal rate. Regular rhythm. Normal S1. Normal S2.      Murmurs: There is no murmur.  Edema:    Peripheral edema absent.   Abdominal:     Palpations: Abdomen is soft non tender. There is no hepatomegaly.  Skin:    General: Skin is warm and dry.  Neurological:     General: No focal deficit present.     Mental Status: Alert and oriented to person, place and time.     Cranial Nerves: Cranial nerves are intact.  EKG/LABS/Other Studies Reviewed    ECG personally reviewed by me today -none completed today  Risk Assessment/Calculations:    CHA2DS2-VASc Score =     This indicates a  % annual risk of stroke. The patient's score is based upon:   Lab Results  Component Value Date   WBC 4.2 01/07/2022   HGB 13.4 01/07/2022   HCT 40.3 01/07/2022   MCV 88.8 01/07/2022   PLT 234 01/07/2022   Lab Results  Component Value Date   CREATININE 1.07 01/07/2022   BUN 13 01/07/2022   NA 138 01/07/2022   K 3.5 01/07/2022   CL 102 01/07/2022   CO2 30 01/07/2022   Lab Results  Component Value Date   ALT 17 03/05/2021   AST 18 03/05/2021   ALKPHOS 54 03/05/2021   BILITOT 1.0 03/05/2021   Lab Results  Component Value Date   CHOL 142 04/16/2020   HDL 48 04/16/2020   LDLCALC 79 04/16/2020   TRIG 74 04/16/2020   CHOLHDL 4.4 03/02/2019    No results found for: "HGBA1C"  Assessment & Plan    1.  Paroxysmal atrial fibrillation: -Rate control currently with carvedilol 6.25 mg twice daily -Patient is in sinus rhythm today with a rate of 60 -Currently on Eliquis 5 mg twice daily  2.  Dizziness/chest pain: -Patient reports today that his chest pain has resolved and is related to musculoskeletal pain following recent physical therapy -Continue amlodipine 10 mg daily and carvedilol 12.5 mg as noted above. -Most recent ischemic evaluation was 2020  by Leane Call with normal low risk study  3.  Hypertension: -Blood pressure today was well-controlled at 128/68 -Continue current antihypertensive regimen as noted above -Low-sodium heart healthy diet encouraged  4.  Hyperlipidemia: -Patient's last LDL was  119 -Patient currently on Nexletol 180 mg daily -Patient encouraged to increase physical activity and reduce intake of sweets and saturated fats.   Disposition: Follow-up with Jenkins Rouge, MD or APP in 12 months   Medication Adjustments/Labs and Tests Ordered: Current medicines are reviewed at length with the patient today.  Concerns regarding medicines are outlined above.   Signed, Mable Fill, Marissa Nestle, NP 03/03/2022, 10:35 AM [place]

## 2022-03-03 ENCOUNTER — Ambulatory Visit: Payer: Medicare HMO | Admitting: Nurse Practitioner

## 2022-03-03 ENCOUNTER — Encounter: Payer: Self-pay | Admitting: Nurse Practitioner

## 2022-03-03 VITALS — BP 128/68 | HR 60 | Ht 67.0 in | Wt 218.6 lb

## 2022-03-03 DIAGNOSIS — E785 Hyperlipidemia, unspecified: Secondary | ICD-10-CM

## 2022-03-03 DIAGNOSIS — I480 Paroxysmal atrial fibrillation: Secondary | ICD-10-CM | POA: Diagnosis not present

## 2022-03-03 DIAGNOSIS — R0789 Other chest pain: Secondary | ICD-10-CM | POA: Diagnosis not present

## 2022-03-03 DIAGNOSIS — I10 Essential (primary) hypertension: Secondary | ICD-10-CM | POA: Diagnosis not present

## 2022-03-03 NOTE — Patient Instructions (Addendum)
Medication Instructions:  NO CHANGES *If you need a refill on your cardiac medications before your next appointment, please call your pharmacy*   Lab Work: NONE If you have labs (blood work) drawn today and your tests are completely normal, you will receive your results only by: [place] (if you have MyChart) OR A paper copy in the mail If you have any lab test that is abnormal or we need to change your treatment, we will call you to review the results.   Testing/Procedures: NONE   Follow-Up: At [facility], you and your health needs are our priority.  As part of our continuing mission to provide you with exceptional heart care, we have created designated Provider Care Teams.  These Care Teams include your primary Cardiologist (physician) and Advanced Practice Providers (APPs -  Physician Assistants and Nurse Practitioners) who all work together to provide you with the care you need, when you need it.  We recommend signing up for the patient portal called "MyChart".  Sign up information is provided on this After Visit Summary.  MyChart is used to connect with patients for Virtual Visits (Telemedicine).  Patients are able to view lab/test results, encounter notes, upcoming appointments, etc.  Non-urgent messages can be sent to your provider as well.   To learn more about what you can do with MyChart, go to [email or web address].    Your next appointment:   1 year(s) [place] OFFICE   The format for your next appointment:   In Person  Provider:   Jenkins Rouge, MD    Other Instructions NONE  Important Information About Sugar

## 2022-03-14 DIAGNOSIS — G43719 Chronic migraine without aura, intractable, without status migrainosus: Secondary | ICD-10-CM | POA: Diagnosis not present

## 2022-03-24 ENCOUNTER — Telehealth: Payer: Self-pay

## 2022-03-24 NOTE — Telephone Encounter (Signed)
Pt called wanting to know if you would send in an Rx for  diltiazem (CARDIZEM) 2 %   Place 1 application rectally 4 times daily. 60 g    Pt states that he got it from the provider that we referred him to at [facility]. Pt is needing this sent to [facility].

## 2022-03-28 NOTE — Telephone Encounter (Signed)
He was doing well without any rectal pain when I saw him last in June. Would recommend OV for exam prior to prescribing diltiazem rectal cream.

## 2022-03-29 NOTE — Telephone Encounter (Signed)
Spoke to pt, informed him that he will need an OV for prescription. Pt voiced understanding. Transferred to the front to make OV.

## 2022-03-29 NOTE — Telephone Encounter (Signed)
Noted  

## 2022-03-29 NOTE — Telephone Encounter (Signed)
LMOM for pt to call office  

## 2022-04-07 ENCOUNTER — Ambulatory Visit (INDEPENDENT_AMBULATORY_CARE_PROVIDER_SITE_OTHER): Payer: Medicare Other | Admitting: Urology

## 2022-04-07 ENCOUNTER — Encounter: Payer: Self-pay | Admitting: Urology

## 2022-04-07 VITALS — BP 153/82 | HR 66

## 2022-04-07 DIAGNOSIS — N401 Enlarged prostate with lower urinary tract symptoms: Secondary | ICD-10-CM

## 2022-04-07 DIAGNOSIS — R3912 Poor urinary stream: Secondary | ICD-10-CM

## 2022-04-07 DIAGNOSIS — N411 Chronic prostatitis: Secondary | ICD-10-CM

## 2022-04-07 DIAGNOSIS — N138 Other obstructive and reflux uropathy: Secondary | ICD-10-CM

## 2022-04-07 MED ORDER — ALFUZOSIN HCL ER 10 MG PO TB24: 10.0000 mg | ORAL_TABLET | Freq: Every day | ORAL | 3 refills | Status: DC

## 2022-04-07 NOTE — Progress Notes (Unsigned)
04/07/2022 11:17 AM   Steven [name] Dec 28, 1950 [id number]  Referring provider: Redmond School, MD [address] [place],  [place] [address]  Followup chronic prostatitis and weak stream   HPI: Steven [name] is a 71yo here for followup for chronic prostatitis and weak urinary stream. IPSS 5 QOL 3 on uroxatral '10mg'$ . He has nocturia 1-3x. Urine stream strong. He drinks 20oz oof water within 2 hours of going to bed. No straining to urinate. No dysuria. No other complaints today   PMH: Past Medical History:  Diagnosis Date   Anxiety    Arthritis    Atrial fibrillation (HCC)    Atrial flutter (HCC)    BPH (benign prostatic hyperplasia)    Chest pain    Dizziness    Dyspnea    laying down occ   Fracture 08/17/2015   MULTIPLE RIB FRACTURES     FROM FALL    GERD (gastroesophageal reflux disease)    Hemorrhoids    History of kidney stones    noted on CT scan   History of radiation therapy 08/22/11-10/13/11   prostate   Hyperlipemia    Hypertension    Hyperthyroidism    IBS (irritable bowel syndrome)    Insomnia    Light headedness    Migraine    Numbness and tingling in left arm    Numbness and tingling of both legs    Open fracture of left elbow 08/18/2015   Prostate cancer [facility])    prostate s/p radiation Mar 2013   Rib fractures 08/17/2015   Tinnitus     Surgical History: Past Surgical History:  Procedure Laterality Date   BOTOX INJECTION N/A 01/14/2020   Procedure: INJECTION OF BOTOX INTO ANAL SPHINCTER;  Surgeon: Ileana Roup, MD;  Location: [facility];  Service: General;  Laterality: N/A;   COLONOSCOPY N/A 12/19/2012   [id number] 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 Normal appearing colon   COLONOSCOPY WITH PROPOFOL N/A 10/23/2019   Procedure: COLONOSCOPY WITH PROPOFOL;  Surgeon: Daneil Dolin, MD; external and grade 2 internal hemorrhoids, abnormal rectal blood vessels consistent with radiation  proctitis s/p APC therapy, otherwise normal exam.   EVALUATION UNDER ANESTHESIA WITH ANAL FISTULECTOMY N/A 01/14/2020   Procedure: ANORECTAL EXAM UNDER ANESTHESIA;  Surgeon: Ileana Roup, MD;  Location: [facility];  Service: General;  Laterality: N/A;   HOT HEMOSTASIS  10/23/2019   Procedure: HOT HEMOSTASIS (ARGON PLASMA COAGULATION/BICAP);  Surgeon: Daneil Dolin, MD;  [facility];  Service: Endoscopy;;  apc rectal proctitis     KIDNEY SURGERY  1982   kidney tube collapse repair   RADIOACTIVE SEED IMPLANT     Prostate    Home Medications:  Allergies as of 04/07/2022       Reactions   Latex Rash   Possible reaction to latex gloves per patient   Meclizine    Other reaction(s): Abdominal Pain, Other        Medication List        Accurate as of April 07, 2022 11:17 AM. If you have any questions, ask your nurse or doctor.          Aimovig 70 MG/ML Soaj Generic drug: Erenumab-aooe Inject 1 mL into the skin every 30 (thirty) days.   alfuzosin 10 MG 24 hr tablet Commonly known as: UROXATRAL Take 1 tablet (10 mg total) by mouth daily with breakfast.   amitriptyline 10 MG tablet Commonly known as: ELAVIL  Take 10 mg by mouth at bedtime as needed for sleep.   amLODipine 10 MG tablet Commonly known as: NORVASC Take 1 tablet by mouth daily.   B-12 PO B12   carvedilol 6.25 MG tablet Commonly known as: COREG Take 1 tablet (6.25 mg total) by mouth 2 (two) times daily.   clotrimazole-betamethasone cream Commonly known as: LOTRISONE Apply 1 application topically 2 (two) times daily.   diazepam 5 MG tablet Commonly known as: VALIUM Take 5 mg by mouth daily as needed for anxiety.   Eliquis 5 MG Tabs tablet Generic drug: apixaban TAKE 1 TABLET(5 MG) BY MOUTH TWICE DAILY   fluticasone 50 MCG/ACT nasal spray Commonly known as: FLONASE fluticasone propionate 50 mcg/actuation nasal spray,suspension  SHAKE LIQUID AND USE 1 SPRAY IN EACH NOSTRIL EVERY  DAY   gabapentin 300 MG capsule Commonly known as: NEURONTIN Take 300 mg by mouth 3 (three) times daily.   hydrochlorothiazide 25 MG tablet Commonly known as: HYDRODIURIL Take 25 mg by mouth daily.   losartan 100 MG tablet Commonly known as: COZAAR Take 100 mg by mouth daily.   minocycline 100 MG tablet Commonly known as: DYNACIN minocycline 100 mg tablet  Take 1 tablet every 12 hours by oral route.   Nexletol 180 MG Tabs Generic drug: Bempedoic Acid Take 180 mg by mouth daily. Pt taking 2-3 times a week   omeprazole 20 MG capsule Commonly known as: PRILOSEC Take 1 capsule (20 mg total) by mouth 2 (two) times daily before a meal.   ondansetron 4 MG disintegrating tablet Commonly known as: Zofran ODT Take 1 tablet (4 mg total) by mouth every 8 (eight) hours as needed for nausea or vomiting.   polyethylene glycol 17 g packet Commonly known as: MIRALAX / GLYCOLAX Miralax   potassium chloride SA 20 MEQ tablet Commonly known as: KLOR-CON M Take 20 mEq by mouth 2 (two) times daily.   PROBIOTIC-10 PO Take 1 capsule by mouth daily.   tadalafil 20 MG tablet Commonly known as: CIALIS Take 1 tablet (20 mg total) by mouth daily as needed.   Vitamin D3 125 MCG (5000 UT) Caps Take 1 capsule by mouth daily.        Allergies:  Allergies  Allergen Reactions   Latex Rash    Possible reaction to latex gloves per patient   Meclizine     Other reaction(s): Abdominal Pain, Other    Family History: Family History  Problem Relation Age of Onset   Aneurysm Mother        aortic   Hypertension Mother    Headache Mother    Prostate cancer Father    Hypertension Father    Colon cancer Neg Hx    Colon polyps Neg Hx     Social History:  reports that he quit smoking about 3 years ago. His smoking use included cigarettes. He has a 20.00 pack-year smoking history. He has never used smokeless tobacco. He reports that he does not drink alcohol and does not use  drugs.  ROS: All other review of systems were reviewed and are negative except what is noted above in HPI  Physical Exam: BP (!) 153/82   Pulse 66   Constitutional:  Alert and oriented, No acute distress. HEENT: [place] AT, moist mucus membranes.  Trachea midline, no masses. Cardiovascular: No clubbing, cyanosis, or edema. Respiratory: Normal respiratory effort, no increased work of breathing. GI: Abdomen is soft, nontender, nondistended, no abdominal masses GU: No CVA tenderness.  Lymph: No cervical  or inguinal lymphadenopathy. Skin: No rashes, bruises or suspicious lesions. Neurologic: Grossly intact, no focal deficits, moving all 4 extremities. Psychiatric: Normal mood and affect.  Laboratory Data: Lab Results  Component Value Date   WBC 4.2 01/07/2022   HGB 13.4 01/07/2022   HCT 40.3 01/07/2022   MCV 88.8 01/07/2022   PLT 234 01/07/2022    Lab Results  Component Value Date   CREATININE 1.07 01/07/2022    Lab Results  Component Value Date   PSA 1.8 12/10/2019   PSA 2.3 11/10/2019    No results found for: "TESTOSTERONE"  No results found for: "HGBA1C"  Urinalysis    Component Value Date/Time   COLORURINE COLORLESS (A) 02/17/2021 1112   APPEARANCEUR Clear 06/14/2021 1416   LABSPEC 1.002 (L) 02/17/2021 1112   PHURINE 7.0 02/17/2021 1112   GLUCOSEU Negative 06/14/2021 1416   HGBUR NEGATIVE 02/17/2021 1112   BILIRUBINUR Negative 06/14/2021 1416   KETONESUR NEGATIVE 02/17/2021 1112   PROTEINUR Negative 06/14/2021 1416   PROTEINUR NEGATIVE 02/17/2021 1112   UROBILINOGEN negative (A) 12/12/2019 1113   UROBILINOGEN 1.0 08/03/2012 1156   NITRITE Negative 06/14/2021 1416   NITRITE NEGATIVE 02/17/2021 1112   LEUKOCYTESUR Negative 06/14/2021 1416   LEUKOCYTESUR NEGATIVE 02/17/2021 1112    Lab Results  Component Value Date   LABMICR Comment 06/14/2021   WBCUA None seen 03/02/2020   LABEPIT None seen 03/02/2020   BACTERIA None seen 03/02/2020    Pertinent  Imaging:  Results for orders placed during the hospital encounter of 11/07/19  DG Abdomen 1 View  Narrative CLINICAL DATA:  Constipation. Hemorrhoids. Irritable bowel syndrome. Prostate cancer.  EXAM: ABDOMEN - 1 VIEW  COMPARISON:  CT pelvis 09/25/2019 in CT abdomen 04/29/2019  FINDINGS: Oval-shaped densities along the midline of the upper abdomen, possibly in the stomach. The patient had gallstones on the 04/29/2019 exam but these densities appear more regular and more medially located than expected location for gallstones. There is another oval-shaped density projecting over the transverse colon and 1 along the rectum, again I suspect that these are within stomach/bowel.  The tiny punctate right kidney lower pole renal calculus shown on 04/29/2019 is not readily seen on today's conventional radiographs.  Dextroconvex lumbar scoliosis with rotary component. No dilated bowel. Bowel gas pattern appears otherwise unremarkable.  IMPRESSION: 1. Oval-shaped densities in the abdomen are thought to be in the stomach and bowel. 2. No dilated bowel.  Unremarkable bowel gas pattern. 3. Dextroconvex lumbar scoliosis with rotary component.   Electronically Signed By: Van Clines M.D. On: 11/07/2019 17:35  No results found for this or any previous visit.  No results found for this or any previous visit.  No results found for this or any previous visit.  No results found for this or any previous visit.  No results found for this or any previous visit.  No results found for this or any previous visit.  No results found for this or any previous visit.   Assessment & Plan:    1. Benign prostatic hyperplasia with urinary obstruction -Continue uroxatral '10mg'$  qhs - Urinalysis, Routine w reflex microscopic  2. Weak urinary stream -Continue uroxatral '10mg'$    3. Chronic prostatitis Continue uroxatral 10g qhs   No follow-ups on file.  Nicolette Bang, MD  [facility] [place]

## 2022-04-07 NOTE — Patient Instructions (Signed)

## 2022-04-17 ENCOUNTER — Telehealth: Payer: Self-pay | Admitting: Psychiatry

## 2022-04-17 NOTE — Telephone Encounter (Signed)
Pt called and LVM with just name and phone number. Returned pt's call back but no answer. LVM for pt to call back.

## 2022-04-17 NOTE — Telephone Encounter (Signed)
Called pt back no answer. LVM to call back if he has any questions.

## 2022-04-19 ENCOUNTER — Ambulatory Visit: Payer: Medicare Other | Admitting: Urology

## 2022-04-19 DIAGNOSIS — N529 Male erectile dysfunction, unspecified: Secondary | ICD-10-CM

## 2022-04-22 ENCOUNTER — Other Ambulatory Visit: Payer: Self-pay | Admitting: Cardiovascular Disease

## 2022-04-24 ENCOUNTER — Encounter: Payer: Self-pay | Admitting: Psychiatry

## 2022-04-24 ENCOUNTER — Ambulatory Visit (INDEPENDENT_AMBULATORY_CARE_PROVIDER_SITE_OTHER): Payer: Medicare Other | Admitting: Psychiatry

## 2022-04-24 VITALS — BP 132/81 | HR 66 | Ht 67.0 in | Wt 221.0 lb

## 2022-04-24 DIAGNOSIS — R519 Headache, unspecified: Secondary | ICD-10-CM | POA: Diagnosis not present

## 2022-04-24 DIAGNOSIS — R29818 Other symptoms and signs involving the nervous system: Secondary | ICD-10-CM

## 2022-04-24 MED ORDER — DULOXETINE HCL 20 MG PO CPEP: 20.0000 mg | ORAL_CAPSULE | Freq: Every day | ORAL | 6 refills | Status: DC

## 2022-04-24 MED ORDER — LORAZEPAM 0.5 MG PO TABS: ORAL_TABLET | ORAL | 0 refills | Status: DC

## 2022-04-24 MED ORDER — UBRELVY 100 MG PO TABS: 100.0000 mg | ORAL_TABLET | ORAL | 6 refills | Status: DC | PRN

## 2022-04-24 NOTE — Progress Notes (Signed)
   CC:  headaches  Follow-up Visit  Last visit: 10/04/21  Brief HPI: 71 year old male with a history of afib, HTN, OSA, hyperthyroidism, IBS who follows in clinic for cervicogenic headaches and migraines. MRI brain 06/2020 was unremarkable. MRI C-spine with severe foraminal narrowing at C3-4 and C6-7.  At his last visit, gabapentin dose was increased.   Interval History: He continues to have frequent headaches. They are described as dull aching with associated nausea and phonophobia. No photophobia. Has a headache every 1-2 days. Takes Tylenol or Nurtec as needed which helps somewhat but does not relieve the pain.  He is still taking Aimovig and gabapentin 300 mg TID. Has tried higher doses of gabapentin but this caused constipation.  He is also having sharp, stabbing, and burning pains in his fingers. Reports pain throughout his entire body in his joints and extremities.  He saw NSGY who recommended repeating neck PT. He went to neck PT in June which he thinks helped his neck pain somewhat.  Continues to have intermittent right sided tinnitus and vertigo. This started in October 2022. Takes dramamine for vertigo which helps a little.   Headache days per month: 15 Headache free days per month: 15  Current Headache Regimen: Preventative: gabapentin 300 mg TID Abortive: Tylenol, Nurtec 75 mg PRN   Prior Therapies                                  Prevention: Amitriptyline 10 mg QHS Topamax 100 mg daily - kidney stones Losartan 100 mg daily Carvedilol 6.25 mg daily Metoprolol 25 mg BID Aimovig 70 mg  Gabapentin 300 mg TID Imitrex 50 mg PRN - side effects  Rescue: Nurtec 75 mg PRN - lack of efficacy Valium Dramamine Flexeril Robaxin Zofran    Physical Exam:   Vital Signs: BP 132/81   Pulse 66   Ht '5\' 7"'$  (1.702 m)   Wt 221 lb (100.2 kg)   BMI 34.61 kg/m  GENERAL:  well appearing, in no acute distress, alert  SKIN:  Color, texture, turgor normal. No rashes or  lesions HEAD:  Normocephalic/atraumatic. RESP: normal respiratory effort  NEUROLOGICAL: Mental Status: Alert, oriented to person, place and time, Follows commands, and Speech fluent and appropriate. Cranial Nerves: PERRL, face symmetric, no dysarthria, hearing grossly intact Motor: moves all extremities equally Gait: normal-based.  IMPRESSION: 71 year old male with a history of afib, HTN, OSA, hyperthyroidism, IBS who presents for follow up of headaches. He was unable to tolerate increased doses of gabapentin due to constipation. Will add low dose Cymbalta which may help with his headaches and neuropathy. Will start Ubrelvy for migraine rescue and see if this is any more effective than Nurtec.  PLAN: -Prevention: Start Cymbalta 20 mg daily. Continue gabapentin and Aimovig as prescribed by outside providers -Rescue: Start Ubrelvy 100 mg PRN -Supplement information for neuropathy management provided -next steps: consider occipital nerve block  Follow-up: 4 months  I spent a total of 40 minutes on the date of the service. Headache education was done. Discussed treatment options including preventive and acute medications. Discussed medication side effects, adverse reactions and drug interactions. Written educational materials and patient instructions outlining all of the above were given.  Genia Harold, MD 04/24/22 10:10 AM

## 2022-04-24 NOTE — Telephone Encounter (Signed)
Prescription refill request for Eliquis received. Indication: PAF Last office visit: 03/03/22  Elvin So NP Scr: 1.07 on 01/07/22 Age: 71 Weight: 99.2kg  Based on above findings Eliquis '5mg'$  twice daily is the appropriate dose.  Refill approved.

## 2022-04-24 NOTE — Patient Instructions (Addendum)
MRI of the brain  Start Cymbalta 20 mg daily for headaches and body pain  Start Ubrelvy as needed for migraines. Take one pill at onset as needed for migraine. May repeat a dose in 2 hours if needed. Max dose 2 pills in 24 hours  Supplements that can be tried for "nerve" type pain: 1. Alpha lipoic acid '600mg'$  daily: Has some research data but actual dose not well established as they used IV in the clinical research trials. No major side effects other than <1% of people report upset stomach. This can be taken twice per day ('1200mg'$  daily) if no relief obtained.  2. Acetyl-L-carnitine '1000mg'$  3 times daily: this has the most research data backing it with reports of diabetic, HIV and chemotherapy related neuropathy patients reporting improved symptoms. Well tolerated overall but can cause GI upset so take it with food.  3. Fish Oil ('750mg'$  eicosapentaenoic acid, '560mg'$  docosapentaenoic acid, '1020mg'$  docosahexaenoic acid) - animal models suggest it can improve neuropathy and potentially stimulate nerve growth. It has also been shown to be beneficial in humans with type I diabetes and neuropathy.  4. Lidocaine cream - 1-2% can be applied to the feet/symptomatic areas several times daily. Wear gloves!  5. Capsaicin cream: Made of chili peppers, this cream burns and is actually rather painful when you first put it on. Mechanism of action is that it overwhelms all of the pain fibers, which theoretically lessens the pain. Wear gloves!  8. Curcumin - up to '600mg'$  three times daily - somewhat expensive, but can be found on [employer]. Most supplements offer ~'1800mg'$  just to be taken daily. Animal models suggest it may help with neve pain and may also help with weight loss. There are no large data human trials to support the use of this.  9. Vitamin E '10mg'$  twice daily  10. Vick's vapor rub  11. CBD topical cream. We cannot specifically recommend this given current federal laws, and there is no scientific data behind  this at present to make a recommendation. But anecdotally people have said this can help. One specific form is called "Nature's Best".

## 2022-04-25 ENCOUNTER — Telehealth: Payer: Self-pay | Admitting: Psychiatry

## 2022-04-25 DIAGNOSIS — R519 Headache, unspecified: Secondary | ICD-10-CM

## 2022-04-25 DIAGNOSIS — R29818 Other symptoms and signs involving the nervous system: Secondary | ICD-10-CM

## 2022-04-25 NOTE — Telephone Encounter (Signed)
Order for open MRI faxed to [facility], phone # [phone].

## 2022-04-27 NOTE — Telephone Encounter (Signed)
Patient is requesting to get the MRI done without contrast. Can you put in a new order for me to send to [facility] please?

## 2022-04-27 NOTE — Addendum Note (Signed)
Addended by: Genia Harold on: 04/27/2022 12:54 PM   Modules accepted: Orders

## 2022-04-27 NOTE — Telephone Encounter (Signed)
New order sent, thanks

## 2022-04-27 NOTE — Telephone Encounter (Signed)
New order sent to [facility]

## 2022-05-02 NOTE — Telephone Encounter (Signed)
Received MRI head without contrast report from [facility]. Placed on MD desk for review.

## 2022-05-03 ENCOUNTER — Ambulatory Visit (INDEPENDENT_AMBULATORY_CARE_PROVIDER_SITE_OTHER): Payer: Medicare Other | Admitting: Urology

## 2022-05-03 ENCOUNTER — Telehealth: Payer: Self-pay | Admitting: Psychiatry

## 2022-05-03 ENCOUNTER — Encounter: Payer: Self-pay | Admitting: Urology

## 2022-05-03 ENCOUNTER — Telehealth: Payer: Self-pay

## 2022-05-03 VITALS — BP 147/80 | HR 69

## 2022-05-03 DIAGNOSIS — N529 Male erectile dysfunction, unspecified: Secondary | ICD-10-CM | POA: Diagnosis not present

## 2022-05-03 MED ORDER — TADALAFIL 5 MG PO TABS: 5.0000 mg | ORAL_TABLET | Freq: Every day | ORAL | 11 refills | Status: DC

## 2022-05-03 NOTE — Addendum Note (Signed)
Addended by: Cleon Gustin on: 05/03/2022 12:06 PM   Modules accepted: Orders

## 2022-05-03 NOTE — Telephone Encounter (Signed)
Pt is calling. Stated he got a letter for medication  Ubrogepant (UBRELVY) 100 MG TABS. Pt said nurse need to get up with his insurance. Pt is requesting a call back from nurse.

## 2022-05-03 NOTE — Telephone Encounter (Signed)
Contacted pt back, phone went straight to VM again (called earlier regarding MRI). LVM rq call back

## 2022-05-03 NOTE — Progress Notes (Signed)
05/03/2022 11:52 AM   Steven [name] [birth year] [id number]  Referring provider: Redmond School, MD [address] [place],  [place] [address]  Erectile Dysfunction   HPI: [name] is a 71yo here for evaluation of erectile dysfunction. He had previously took cialis '20mg'$  which failed to give him a firm erection. He took sildenafil '100mg'$  which gave him a headache. He is currently on Eliquis.    PMH: Past Medical History:  Diagnosis Date   Anxiety    Arthritis    Atrial fibrillation (HCC)    Atrial flutter (HCC)    BPH (benign prostatic hyperplasia)    Chest pain    Dizziness    Dyspnea    laying down occ   Fracture 08/17/2015   MULTIPLE RIB FRACTURES     FROM FALL    GERD (gastroesophageal reflux disease)    Hemorrhoids    History of kidney stones    noted on CT scan   History of radiation therapy 08/22/11-10/13/11   prostate   Hyperlipemia    Hypertension    Hyperthyroidism    IBS (irritable bowel syndrome)    Insomnia    Light headedness    Migraine    Numbness and tingling in left arm    Numbness and tingling of both legs    Open fracture of left elbow 08/18/2015   Prostate cancer [facility])    prostate s/p radiation Mar 2013   Rib fractures 08/17/2015   Tinnitus     Surgical History: Past Surgical History:  Procedure Laterality Date   BOTOX INJECTION N/A 01/14/2020   Procedure: INJECTION OF BOTOX INTO ANAL SPHINCTER;  Surgeon: Ileana Roup, MD;  Location: [facility]S;  Service: General;  Laterality: N/A;   COLONOSCOPY N/A 12/19/2012   [id number] 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 Normal appearing colon   COLONOSCOPY WITH PROPOFOL N/A 10/23/2019   Procedure: COLONOSCOPY WITH PROPOFOL;  Surgeon: Daneil Dolin, MD; external and grade 2 internal hemorrhoids, abnormal rectal blood vessels consistent with radiation proctitis s/p APC therapy, otherwise normal exam.   EVALUATION UNDER ANESTHESIA  WITH ANAL FISTULECTOMY N/A 01/14/2020   Procedure: ANORECTAL EXAM UNDER ANESTHESIA;  Surgeon: Ileana Roup, MD;  Location: [facility];  Service: General;  Laterality: N/A;   HOT HEMOSTASIS  10/23/2019   Procedure: HOT HEMOSTASIS (ARGON PLASMA COAGULATION/BICAP);  Surgeon: Daneil Dolin, MD;  Location: [facility];  Service: Endoscopy;;  apc rectal proctitis     KIDNEY SURGERY  1982   kidney tube collapse repair   RADIOACTIVE SEED IMPLANT     Prostate    Home Medications:  Allergies as of 05/03/2022       Reactions   Latex Rash   Possible reaction to latex gloves per patient   Meclizine    Other reaction(s): Abdominal Pain, Other        Medication List        Accurate as of May 03, 2022 11:52 AM. If you have any questions, ask your nurse or doctor.          Aimovig 70 MG/ML Soaj Generic drug: Erenumab-aooe Inject 1 mL into the skin every 30 (thirty) days.   alfuzosin 10 MG 24 hr tablet Commonly known as: UROXATRAL Take 1 tablet (10 mg total) by mouth daily with breakfast.   amLODipine 10 MG tablet Commonly known as: NORVASC Take 1 tablet by mouth daily.   B-12 PO B12   carvedilol 6.25  MG tablet Commonly known as: COREG Take 1 tablet (6.25 mg total) by mouth 2 (two) times daily.   clotrimazole-betamethasone cream Commonly known as: LOTRISONE Apply 1 application topically 2 (two) times daily.   diazepam 5 MG tablet Commonly known as: VALIUM Take 5 mg by mouth daily as needed for anxiety.   DULoxetine 20 MG capsule Commonly known as: Cymbalta Take 1 capsule (20 mg total) by mouth daily.   Eliquis 5 MG Tabs tablet Generic drug: apixaban TAKE 1 TABLET(5 MG) BY MOUTH TWICE DAILY   fluticasone 50 MCG/ACT nasal spray Commonly known as: FLONASE fluticasone propionate 50 mcg/actuation nasal spray,suspension  SHAKE LIQUID AND USE 1 SPRAY IN EACH NOSTRIL EVERY DAY   gabapentin 300 MG capsule Commonly known as: NEURONTIN Take 300 mg by mouth 3  (three) times daily.   hydrochlorothiazide 25 MG tablet Commonly known as: HYDRODIURIL Take 25 mg by mouth daily.   LORazepam 0.5 MG tablet Commonly known as: Ativan Take 1-2 pills 30 minutes before MRI   losartan 100 MG tablet Commonly known as: COZAAR Take 100 mg by mouth daily.   minocycline 100 MG tablet Commonly known as: DYNACIN minocycline 100 mg tablet  Take 1 tablet every 12 hours by oral route.   Nexletol 180 MG Tabs Generic drug: Bempedoic Acid Take 180 mg by mouth daily. Pt taking 2-3 times a week   omeprazole 20 MG capsule Commonly known as: PRILOSEC Take 1 capsule (20 mg total) by mouth 2 (two) times daily before a meal.   ondansetron 4 MG disintegrating tablet Commonly known as: Zofran ODT Take 1 tablet (4 mg total) by mouth every 8 (eight) hours as needed for nausea or vomiting.   polyethylene glycol 17 g packet Commonly known as: MIRALAX / GLYCOLAX Miralax   potassium chloride SA 20 MEQ tablet Commonly known as: KLOR-CON M Take 20 mEq by mouth 2 (two) times daily.   PROBIOTIC-10 PO Take 1 capsule by mouth daily.   tadalafil 20 MG tablet Commonly known as: CIALIS Take 1 tablet (20 mg total) by mouth daily as needed.   Ubrelvy 100 MG Tabs Generic drug: Ubrogepant Take 100 mg by mouth as needed (for migraine). May repeat a dose in 2 hours if headache persists. Max dose 2 pills in 24 hours   Vitamin D3 125 MCG (5000 UT) Caps Take 1 capsule by mouth daily.        Allergies:  Allergies  Allergen Reactions   Latex Rash    Possible reaction to latex gloves per patient   Meclizine     Other reaction(s): Abdominal Pain, Other    Family History: Family History  Problem Relation Age of Onset   Aneurysm Mother        aortic   Hypertension Mother    Headache Mother    Prostate cancer Father    Hypertension Father    Colon cancer Neg Hx    Colon polyps Neg Hx     Social History:  reports that he quit smoking about 3 years ago. His  smoking use included cigarettes. He has a 20.00 pack-year smoking history. He has never used smokeless tobacco. He reports that he does not drink alcohol and does not use drugs.  ROS: All other review of systems were reviewed and are negative except what is noted above in HPI  Physical Exam: BP (!) 147/80   Pulse 69   Constitutional:  Alert and oriented, No acute distress. HEENT:  AT, moist mucus membranes.  Trachea  midline, no masses. Cardiovascular: No clubbing, cyanosis, or edema. Respiratory: Normal respiratory effort, no increased work of breathing. GI: Abdomen is soft, nontender, nondistended, no abdominal masses GU: No CVA tenderness.  Lymph: No cervical or inguinal lymphadenopathy. Skin: No rashes, bruises or suspicious lesions. Neurologic: Grossly intact, no focal deficits, moving all 4 extremities. Psychiatric: Normal mood and affect.  Laboratory Data: Lab Results  Component Value Date   WBC 4.2 01/07/2022   HGB 13.4 01/07/2022   HCT 40.3 01/07/2022   MCV 88.8 01/07/2022   PLT 234 01/07/2022    Lab Results  Component Value Date   CREATININE 1.07 01/07/2022    Lab Results  Component Value Date   PSA 1.8 12/10/2019   PSA 2.3 11/10/2019    No results found for: "TESTOSTERONE"  No results found for: "HGBA1C"  Urinalysis    Component Value Date/Time   COLORURINE COLORLESS (A) 02/17/2021 1112   APPEARANCEUR Clear 06/14/2021 1416   LABSPEC 1.002 (L) 02/17/2021 1112   PHURINE 7.0 02/17/2021 1112   GLUCOSEU Negative 06/14/2021 1416   HGBUR NEGATIVE 02/17/2021 1112   BILIRUBINUR Negative 06/14/2021 1416   KETONESUR NEGATIVE 02/17/2021 1112   PROTEINUR Negative 06/14/2021 1416   PROTEINUR NEGATIVE 02/17/2021 1112   UROBILINOGEN negative (A) 12/12/2019 1113   UROBILINOGEN 1.0 08/03/2012 1156   NITRITE Negative 06/14/2021 1416   NITRITE NEGATIVE 02/17/2021 1112   LEUKOCYTESUR Negative 06/14/2021 1416   LEUKOCYTESUR NEGATIVE 02/17/2021 1112    Lab  Results  Component Value Date   LABMICR Comment 06/14/2021   WBCUA None seen 03/02/2020   LABEPIT None seen 03/02/2020   BACTERIA None seen 03/02/2020    Pertinent Imaging:  Results for orders placed during the hospital encounter of 11/07/19  DG Abdomen 1 View  Narrative CLINICAL DATA:  Constipation. Hemorrhoids. Irritable bowel syndrome. Prostate cancer.  EXAM: ABDOMEN - 1 VIEW  COMPARISON:  CT pelvis 09/25/2019 in CT abdomen 04/29/2019  FINDINGS: Oval-shaped densities along the midline of the upper abdomen, possibly in the stomach. The patient had gallstones on the 04/29/2019 exam but these densities appear more regular and more medially located than expected location for gallstones. There is another oval-shaped density projecting over the transverse colon and 1 along the rectum, again I suspect that these are within stomach/bowel.  The tiny punctate right kidney lower pole renal calculus shown on 04/29/2019 is not readily seen on today's conventional radiographs.  Dextroconvex lumbar scoliosis with rotary component. No dilated bowel. Bowel gas pattern appears otherwise unremarkable.  IMPRESSION: 1. Oval-shaped densities in the abdomen are thought to be in the stomach and bowel. 2. No dilated bowel.  Unremarkable bowel gas pattern. 3. Dextroconvex lumbar scoliosis with rotary component.   Electronically Signed By: Van Clines M.D. On: 11/07/2019 17:35  No results found for this or any previous visit.  No results found for this or any previous visit.  No results found for this or any previous visit.  No results found for this or any previous visit.  No valid procedures specified. No results found for this or any previous visit.  No results found for this or any previous visit.   Assessment & Plan:    1. Erectile dysfunction, unspecified erectile dysfunction type We discussed PDE5s, ICI, VED, Muse and IPP. After discussing the options the  patient elects to continue PDE5s. Patient to followup in 1 year.   No follow-ups on file.  Nicolette Bang, MD  [facility] [place]

## 2022-05-03 NOTE — Telephone Encounter (Signed)
Contacted pharmacy to see If they could help or tell me if its something going on with pts emgality or need PA. She stated he picked it up on the 5th so its nothing on her end she could see. Will wait until pt calls back

## 2022-05-03 NOTE — Telephone Encounter (Signed)
error 

## 2022-05-03 NOTE — Telephone Encounter (Signed)
Contacted pt, LVM per DRP informing him MRI was normal, number provided to call back with questions

## 2022-05-03 NOTE — Patient Instructions (Signed)
Erectile Dysfunction ?Erectile dysfunction (ED) is the inability to get or keep an erection in order to have sexual intercourse. ED is considered a symptom of an underlying disorder and is not considered a disease. ED may include: ?Inability to get an erection. ?Lack of enough hardness of the erection to allow penetration. ?Loss of erection before sex is finished. ?What are the causes? ?This condition may be caused by: ?Physical causes, such as: ?Artery problems. This may include heart disease, high blood pressure, atherosclerosis, and diabetes. ?Hormonal problems, such as low testosterone. ?Obesity. ?Nerve problems. This may include back or pelvic injuries, multiple sclerosis, Parkinson's disease, spinal cord injury, and stroke. ?Certain medicines, such as: ?Pain relievers. ?Antidepressants. ?Blood pressure medicines and water pills (diuretics). ?Cancer medicines. ?Antihistamines. ?Muscle relaxants. ?Lifestyle factors, such as: ?Use of drugs such as marijuana, cocaine, or opioids. ?Excessive use of alcohol. ?Smoking. ?Lack of physical activity or exercise. ?Psychological causes, such as: ?Anxiety or stress. ?Sadness or depression. ?Exhaustion. ?Fear about sexual performance. ?Guilt. ?What are the signs or symptoms? ?Symptoms of this condition include: ?Inability to get an erection. ?Lack of enough hardness of the erection to allow penetration. ?Loss of the erection before sex is finished. ?Sometimes having normal erections, but with frequent unsatisfactory episodes. ?Low sexual satisfaction in either partner due to erection problems. ?A curved penis occurring with erection. The curve may cause pain, or the penis may be too curved to allow for intercourse. ?Never having nighttime or morning erections. ?How is this diagnosed? ?This condition is often diagnosed by: ?Performing a physical exam to find other diseases or specific problems with the penis. ?Asking you detailed questions about the problem. ?Doing tests,  such as: ?Blood tests to check for diabetes mellitus or high cholesterol, or to measure hormone levels. ?Other tests to check for underlying health conditions. ?An ultrasound exam to check for scarring. ?A test to check blood flow to the penis. ?Doing a sleep study at home to measure nighttime erections. ?How is this treated? ?This condition may be treated by: ?Medicines, such as: ?Medicine taken by mouth to help you achieve an erection (oral medicine). ?Hormone replacement therapy to replace low testosterone levels. ?Medicine that is injected into the penis. Your health care provider may instruct you how to give yourself these injections at home. ?Medicine that is delivered with a short applicator tube. The tube is inserted into the opening at the tip of the penis, which is the opening of the urethra. A tiny pellet of medicine is put in the urethra. The pellet dissolves and enhances erectile function. This is also called MUSE (medicated urethral system for erections) therapy. ?Vacuum pump. This is a pump with a ring on it. The pump and ring are placed on the penis and used to create pressure that helps the penis become erect. ?Penile implant surgery. In this procedure, you may receive: ?An inflatable implant. This consists of cylinders, a pump, and a reservoir. The cylinders can be inflated with a fluid that helps to create an erection, and they can be deflated after intercourse. ?A semi-rigid implant. This consists of two silicone rubber rods. The rods provide some rigidity. They are also flexible, so the penis can both curve downward in its normal position and become straight for sexual intercourse. ?Blood vessel surgery to improve blood flow to the penis. During this procedure, a blood vessel from a different part of the body is placed into the penis to allow blood to flow around (bypass) damaged or blocked blood vessels. ?Lifestyle changes,   such as exercising more, losing weight, and quitting smoking. ?Follow  these instructions at home: ?Medicines ? ?Take over-the-counter and prescription medicines only as told by your health care provider. Do not increase the dosage without first discussing it with your health care provider. ?If you are using self-injections, do injections as directed by your health care provider. Make sure you avoid any veins that are on the surface of the penis. After giving an injection, apply pressure to the injection site for 5 minutes. ?Talk to your health care provider about how to prevent headaches while taking ED medicines. These medicines may cause a sudden headache due to the increase in blood flow in your body. ?General instructions ?Exercise regularly, as directed by your health care provider. Work with your health care provider to lose weight, if needed. ?Do not use any products that contain nicotine or tobacco. These products include cigarettes, chewing tobacco, and vaping devices, such as e-cigarettes. If you need help quitting, ask your health care provider. ?Before using a vacuum pump, read the instructions that come with the pump and discuss any questions with your health care provider. ?Keep all follow-up visits. This is important. ?Contact a health care provider if: ?You feel nauseous. ?You are vomiting. ?You get sudden headaches while taking ED medicines. ?You have any concerns about your sexual health. ?Get help right away if: ?You are taking oral or injectable medicines and you have an erection that lasts longer than 4 hours. If your health care provider is unavailable, go to the nearest emergency room for evaluation. An erection that lasts much longer than 4 hours can result in permanent damage to your penis. ?You have severe pain in your groin or abdomen. ?You develop redness or severe swelling of your penis. ?You have redness spreading at your groin or lower abdomen. ?You are unable to urinate. ?You experience chest pain or a rapid heartbeat (palpitations) after taking oral  medicines. ?These symptoms may represent a serious problem that is an emergency. Do not wait to see if the symptoms will go away. Get medical help right away. Call your local emergency services (911 in the [place] Do not drive yourself to the hospital. ?Summary ?Erectile dysfunction (ED) is the inability to get or keep an erection during sexual intercourse. ?This condition is diagnosed based on a physical exam, your symptoms, and tests to determine the cause. Treatment varies depending on the cause and may include medicines, hormone therapy, surgery, or a vacuum pump. ?You may need follow-up visits to make sure that you are using your medicines or devices correctly. ?Get help right away if you are taking or injecting medicines and you have an erection that lasts longer than 4 hours. ?This information is not intended to replace advice given to you by your health care provider. Make sure you discuss any questions you have with your health care provider. ?Document Revised: 10/06/2020 Document Reviewed: 10/06/2020 ?Elsevier Patient Education ? 2023 Elsevier Inc. ? ?

## 2022-05-05 NOTE — Progress Notes (Deleted)
Referring Provider: Redmond School, MD Primary Care Physician:  Redmond School, MD Primary GI Physician: Dr. Gala Romney  No chief complaint on file.   HPI:   Steven [name] is a 71 y.o. male  with history of rectal pain in the setting of chronic anal fissure previously failed topical treatment and underwent Botox therapy with [place] surgery, ultimately referred to [facility] with recommendations to treat supportively with focus on magement of chronic constipation/IBS, previously with weight loss and diagnosed with subacute thyroiditis treated with a course of methimazole and weight loss resolved, previously with morning nausea resolved with omeprazole daily, presenting today for follow-up, medication refills.  Prior treatment of constipation has included colace, Trulance which caused diarrhea, and various doses of MiraLAX, and fiber.  Last seen in our office 01/11/2022.  Prior reports of morning nausea have resolved with omeprazole every morning.  Constipation fairly well controlled with fiber Gummies, olive oil, probiotic, MiraLAX 1 capful daily.  No alarm symptoms.  Stop using toilet paper for wiping and rectal soreness improved.  Recommended continuing current medications.  Patient called 03/24/2022 requesting refill on diltiazem cream.  As he is doing well without any rectal pain when I last saw him in June, recommended office visit for exam prior to refilling diltiazem.  Today:   Past Medical History:  Diagnosis Date   Anxiety    Arthritis    Atrial fibrillation (HCC)    Atrial flutter (HCC)    BPH (benign prostatic hyperplasia)    Chest pain    Dizziness    Dyspnea    laying down occ   Fracture 08/17/2015   MULTIPLE RIB FRACTURES     FROM FALL    GERD (gastroesophageal reflux disease)    Hemorrhoids    History of kidney stones    noted on CT scan   History of radiation therapy 08/22/11-10/13/11   prostate   Hyperlipemia    Hypertension    Hyperthyroidism     IBS (irritable bowel syndrome)    Insomnia    Light headedness    Migraine    Numbness and tingling in left arm    Numbness and tingling of both legs    Open fracture of left elbow 08/18/2015   Prostate cancer [facility])    prostate s/p radiation Mar 2013   Rib fractures 08/17/2015   Tinnitus     Past Surgical History:  Procedure Laterality Date   BOTOX INJECTION N/A 01/14/2020   Procedure: INJECTION OF BOTOX INTO ANAL SPHINCTER;  Surgeon: Ileana Roup, MD;  Location: [facility] ORS;  Service: General;  Laterality: N/A;   COLONOSCOPY N/A 12/19/2012   [id number] 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 Normal appearing colon   COLONOSCOPY WITH PROPOFOL N/A 10/23/2019   Procedure: COLONOSCOPY WITH PROPOFOL;  Surgeon: Daneil Dolin, MD; external and grade 2 internal hemorrhoids, abnormal rectal blood vessels consistent with radiation proctitis s/p APC therapy, otherwise normal exam.   EVALUATION UNDER ANESTHESIA WITH ANAL FISTULECTOMY N/A 01/14/2020   Procedure: ANORECTAL EXAM UNDER ANESTHESIA;  Surgeon: Ileana Roup, MD;  Location: [facility] ORS;  Service: General;  Laterality: N/A;   HOT HEMOSTASIS  10/23/2019   Procedure: HOT HEMOSTASIS (ARGON PLASMA COAGULATION/BICAP);  Surgeon: Daneil Dolin, MD;  Location: [facility] SUITE;  Service: Endoscopy;;  apc rectal proctitis     KIDNEY SURGERY  1982   kidney tube collapse repair   RADIOACTIVE SEED IMPLANT  Prostate    Current Outpatient Medications  Medication Sig Dispense Refill   alfuzosin (UROXATRAL) 10 MG 24 hr tablet Take 1 tablet (10 mg total) by mouth daily with breakfast. 90 tablet 3   amLODipine (NORVASC) 10 MG tablet Take 1 tablet by mouth daily.     carvedilol (COREG) 6.25 MG tablet Take 1 tablet (6.25 mg total) by mouth 2 (two) times daily. 180 tablet 3   Cholecalciferol (VITAMIN D3) 125 MCG (5000 UT) CAPS Take 1 capsule by mouth daily.       clotrimazole-betamethasone (LOTRISONE) cream Apply 1 application topically 2 (two) times daily.     Cyanocobalamin (B-12 PO) B12     diazepam (VALIUM) 5 MG tablet Take 5 mg by mouth daily as needed for anxiety.     DULoxetine (CYMBALTA) 20 MG capsule Take 1 capsule (20 mg total) by mouth daily. 30 capsule 6   ELIQUIS 5 MG TABS tablet TAKE 1 TABLET(5 MG) BY MOUTH TWICE DAILY 60 tablet 5   Erenumab-aooe (AIMOVIG) 70 MG/ML SOAJ Inject 1 mL into the skin every 30 (thirty) days.     fluticasone (FLONASE) 50 MCG/ACT nasal spray fluticasone propionate 50 mcg/actuation nasal spray,suspension  SHAKE LIQUID AND USE 1 SPRAY IN EACH NOSTRIL EVERY DAY     gabapentin (NEURONTIN) 300 MG capsule Take 300 mg by mouth 3 (three) times daily.     hydrochlorothiazide (HYDRODIURIL) 25 MG tablet Take 25 mg by mouth daily.     LORazepam (ATIVAN) 0.5 MG tablet Take 1-2 pills 30 minutes before MRI 2 tablet 0   losartan (COZAAR) 100 MG tablet Take 100 mg by mouth daily.     minocycline (DYNACIN) 100 MG tablet minocycline 100 mg tablet  Take 1 tablet every 12 hours by oral route.     NEXLETOL 180 MG TABS Take 180 mg by mouth daily. Pt taking 2-3 times a week     omeprazole (PRILOSEC) 20 MG capsule Take 1 capsule (20 mg total) by mouth 2 (two) times daily before a meal. 60 capsule 3   ondansetron (ZOFRAN ODT) 4 MG disintegrating tablet Take 1 tablet (4 mg total) by mouth every 8 (eight) hours as needed for nausea or vomiting. 30 tablet 0   polyethylene glycol (MIRALAX / GLYCOLAX) 17 g packet Miralax     potassium chloride SA (KLOR-CON) 20 MEQ tablet Take 20 mEq by mouth 2 (two) times daily.     Probiotic Product (PROBIOTIC-10 PO) Take 1 capsule by mouth daily.     tadalafil (CIALIS) 20 MG tablet Take 1 tablet (20 mg total) by mouth daily as needed. 10 tablet 5   tadalafil (CIALIS) 5 MG tablet Take 1 tablet (5 mg total) by mouth daily. 30 tablet 11   Ubrogepant (UBRELVY) 100 MG TABS Take 100 mg by mouth as needed (for  migraine). May repeat a dose in 2 hours if headache persists. Max dose 2 pills in 24 hours 16 tablet 6   No current facility-administered medications for this visit.    Allergies as of 05/08/2022 - Review Complete 05/03/2022  Allergen Reaction Noted   Latex Rash 03/25/2020   Meclizine  09/29/2021    Family History  Problem Relation Age of Onset   Aneurysm Mother        aortic   Hypertension Mother    Headache Mother    Prostate cancer Father    Hypertension Father    Colon cancer Neg Hx    Colon polyps Neg Hx  Social History   Socioeconomic History   Marital status: Single    Spouse name: Not on file   Number of children: 2   Years of education: Not on file   Highest education level: 12th grade  Occupational History   Occupation: Custodian     Employer: [employer]  Tobacco Use   Smoking status: Former    Packs/day: 1.00    Years: 20.00    Total pack years: 20.00    Types: Cigarettes    Quit date: 2020    Years since quitting: 3.7   Smokeless tobacco: Never   Tobacco comments:    Quit smoking x 2 years    07/2015   SOMETIIMES i USE VAPOR   Vaping Use   Vaping Use: Never used  Substance and Sexual Activity   Alcohol use: Never   Drug use: Never   Sexual activity: Not Currently  Other Topics Concern   Not on file  Social History Narrative   Lives with friend   Divorced   No caffeine   Social Determinants of Health   Financial Resource Strain: Low Risk  (11/03/2020)   Overall Financial Resource Strain (CARDIA)    Difficulty of Paying Living Expenses: Not hard at all  Food Insecurity: No [place] (11/03/2020)   Hunger Vital Sign    Worried About Running Out of Food in the Last Year: Never true    [place] in the Last Year: Never true  Transportation Needs: No Transportation Needs (11/03/2020)   PRAPARE - Hydrologist (Medical): No    Lack of Transportation (Non-Medical): No  Physical Activity:  Inactive (11/03/2020)   Exercise Vital Sign    Days of Exercise per Week: 0 days    Minutes of Exercise per Session: 0 min  Stress: No Stress Concern Present (11/03/2020)   [place]    Feeling of Stress : Not at all  Social Connections: Socially Isolated (11/03/2020)   Social Connection and Isolation Panel [NHANES]    Frequency of Communication with Friends and Family: Twice a week    Frequency of Social Gatherings with Friends and Family: Once a week    Attends Religious Services: Never    Marine scientist or Organizations: No    Attends Music therapist: Never    Marital Status: Divorced    Review of Systems: Gen: Denies fever, chills, cold or flulike symptoms, presyncope, syncope. CV: Denies chest pain, palpitations. Resp: Denies dyspnea, cough.  GI: See HPI Heme: See HPI  Physical Exam: There were no vitals taken for this visit. General:   Alert and oriented. No distress noted. Pleasant and cooperative.  Head:  Normocephalic and atraumatic. Eyes:  Conjuctiva clear without scleral icterus. Heart:  S1, S2 present without murmurs appreciated. Lungs:  Clear to auscultation bilaterally. No wheezes, rales, or rhonchi. No distress.  Abdomen:  +BS, soft, non-tender and non-distended. No rebound or guarding. No HSM or masses noted. Msk:  Symmetrical without gross deformities. Normal posture. Extremities:  Without edema. Neurologic:  Alert and  oriented x4 Psych:  Normal mood and affect.    Assessment:     Plan:  ***   [name], PA-C [facility] 05/08/2022

## 2022-05-08 ENCOUNTER — Ambulatory Visit: Payer: Medicare HMO | Admitting: Gastroenterology

## 2022-05-08 NOTE — Progress Notes (Unsigned)
Referring Provider: Redmond School, MD Primary Care Physician:  Redmond School, MD Primary GI Physician: Dr. Gala Romney  No chief complaint on file.   HPI:   Steven [name] is a 71 y.o. male with history of rectal pain in the setting of chronic anal fissure previously failed topical treatment and underwent Botox therapy with [place] surgery, ultimately referred to [facility] with recommendations to treat supportively with focus on magement of chronic constipation/IBS, previously with weight loss and diagnosed with subacute thyroiditis treated with a course of methimazole and weight loss resolved, previously with morning nausea resolved with omeprazole daily, presenting today for follow-up, medication refills.   Prior treatment of constipation has included colace, Trulance which caused diarrhea, and various doses of MiraLAX, and fiber.   Last seen in our office 01/11/2022.  Prior reports of morning nausea have resolved with omeprazole every morning.  Constipation fairly well controlled with fiber Gummies, olive oil, probiotic, MiraLAX 1 capful daily.  No alarm symptoms.  Stop using toilet paper for wiping and rectal soreness improved.  Recommended continuing current medications.   Patient called 03/24/2022 requesting refill on diltiazem cream.  As he is doing well without any rectal pain when I last saw him in June, recommended office visit for exam prior to refilling diltiazem.   Today:    Past Medical History:  Diagnosis Date   Anxiety    Arthritis    Atrial fibrillation (HCC)    Atrial flutter (HCC)    BPH (benign prostatic hyperplasia)    Chest pain    Dizziness    Dyspnea    laying down occ   Fracture 08/17/2015   MULTIPLE RIB FRACTURES     FROM FALL    GERD (gastroesophageal reflux disease)    Hemorrhoids    History of kidney stones    noted on CT scan   History of radiation therapy 08/22/11-10/13/11   prostate   Hyperlipemia    Hypertension     Hyperthyroidism    IBS (irritable bowel syndrome)    Insomnia    Light headedness    Migraine    Numbness and tingling in left arm    Numbness and tingling of both legs    Open fracture of left elbow 08/18/2015   Prostate cancer [facility])    prostate s/p radiation Mar 2013   Rib fractures 08/17/2015   Tinnitus     Past Surgical History:  Procedure Laterality Date   BOTOX INJECTION N/A 01/14/2020   Procedure: INJECTION OF BOTOX INTO ANAL SPHINCTER;  Surgeon: Ileana Roup, MD;  Location: [facility];  Service: General;  Laterality: N/A;   COLONOSCOPY N/A 12/19/2012   PYK:DXIPJA 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 Normal appearing colon   COLONOSCOPY WITH PROPOFOL N/A 10/23/2019   Procedure: COLONOSCOPY WITH PROPOFOL;  Surgeon: Daneil Dolin, MD; external and grade 2 internal hemorrhoids, abnormal rectal blood vessels consistent with radiation proctitis s/p APC therapy, otherwise normal exam.   EVALUATION UNDER ANESTHESIA WITH ANAL FISTULECTOMY N/A 01/14/2020   Procedure: ANORECTAL EXAM UNDER ANESTHESIA;  Surgeon: Ileana Roup, MD;  Location: [facility];  Service: General;  Laterality: N/A;   HOT HEMOSTASIS  10/23/2019   Procedure: HOT HEMOSTASIS (ARGON PLASMA COAGULATION/BICAP);  Surgeon: Daneil Dolin, MD;  Location: [facility];  Service: Endoscopy;;  apc rectal proctitis     KIDNEY SURGERY  1982   kidney tube collapse repair   RADIOACTIVE SEED  IMPLANT     Prostate    Current Outpatient Medications  Medication Sig Dispense Refill   alfuzosin (UROXATRAL) 10 MG 24 hr tablet Take 1 tablet (10 mg total) by mouth daily with breakfast. 90 tablet 3   amLODipine (NORVASC) 10 MG tablet Take 1 tablet by mouth daily.     carvedilol (COREG) 6.25 MG tablet Take 1 tablet (6.25 mg total) by mouth 2 (two) times daily. 180 tablet 3   Cholecalciferol (VITAMIN D3) 125 MCG (5000 UT) CAPS Take 1 capsule by mouth daily.       clotrimazole-betamethasone (LOTRISONE) cream Apply 1 application topically 2 (two) times daily.     Cyanocobalamin (B-12 PO) B12     diazepam (VALIUM) 5 MG tablet Take 5 mg by mouth daily as needed for anxiety.     DULoxetine (CYMBALTA) 20 MG capsule Take 1 capsule (20 mg total) by mouth daily. 30 capsule 6   ELIQUIS 5 MG TABS tablet TAKE 1 TABLET(5 MG) BY MOUTH TWICE DAILY 60 tablet 5   Erenumab-aooe (AIMOVIG) 70 MG/ML SOAJ Inject 1 mL into the skin every 30 (thirty) days.     fluticasone (FLONASE) 50 MCG/ACT nasal spray fluticasone propionate 50 mcg/actuation nasal spray,suspension  SHAKE LIQUID AND USE 1 SPRAY IN EACH NOSTRIL EVERY DAY     gabapentin (NEURONTIN) 300 MG capsule Take 300 mg by mouth 3 (three) times daily.     hydrochlorothiazide (HYDRODIURIL) 25 MG tablet Take 25 mg by mouth daily.     LORazepam (ATIVAN) 0.5 MG tablet Take 1-2 pills 30 minutes before MRI 2 tablet 0   losartan (COZAAR) 100 MG tablet Take 100 mg by mouth daily.     minocycline (DYNACIN) 100 MG tablet minocycline 100 mg tablet  Take 1 tablet every 12 hours by oral route.     NEXLETOL 180 MG TABS Take 180 mg by mouth daily. Pt taking 2-3 times a week     omeprazole (PRILOSEC) 20 MG capsule Take 1 capsule (20 mg total) by mouth 2 (two) times daily before a meal. 60 capsule 3   ondansetron (ZOFRAN ODT) 4 MG disintegrating tablet Take 1 tablet (4 mg total) by mouth every 8 (eight) hours as needed for nausea or vomiting. 30 tablet 0   polyethylene glycol (MIRALAX / GLYCOLAX) 17 g packet Miralax     potassium chloride SA (KLOR-CON) 20 MEQ tablet Take 20 mEq by mouth 2 (two) times daily.     Probiotic Product (PROBIOTIC-10 PO) Take 1 capsule by mouth daily.     tadalafil (CIALIS) 20 MG tablet Take 1 tablet (20 mg total) by mouth daily as needed. 10 tablet 5   tadalafil (CIALIS) 5 MG tablet Take 1 tablet (5 mg total) by mouth daily. 30 tablet 11   Ubrogepant (UBRELVY) 100 MG TABS Take 100 mg by mouth as needed (for  migraine). May repeat a dose in 2 hours if headache persists. Max dose 2 pills in 24 hours 16 tablet 6   No current facility-administered medications for this visit.    Allergies as of 05/10/2022 - Review Complete 05/03/2022  Allergen Reaction Noted   Latex Rash 03/25/2020   Meclizine  09/29/2021    Family History  Problem Relation Age of Onset   Aneurysm Mother        aortic   Hypertension Mother    Headache Mother    Prostate cancer Father    Hypertension Father    Colon cancer Neg Hx    Colon polyps Neg  Hx     Social History   Socioeconomic History   Marital status: Single    Spouse name: Not on file   Number of children: 2   Years of education: Not on file   Highest education level: 12th grade  Occupational History   Occupation: Custodian     Employer: [employer]  Tobacco Use   Smoking status: Former    Packs/day: 1.00    Years: 20.00    Total pack years: 20.00    Types: Cigarettes    Quit date: 2020    Years since quitting: 3.7   Smokeless tobacco: Never   Tobacco comments:    Quit smoking x 2 years    07/2015   SOMETIIMES i USE VAPOR   Vaping Use   Vaping Use: Never used  Substance and Sexual Activity   Alcohol use: Never   Drug use: Never   Sexual activity: Not Currently  Other Topics Concern   Not on file  Social History Narrative   Lives with friend   Divorced   No caffeine   Social Determinants of Health   Financial Resource Strain: Low Risk  (11/03/2020)   Overall Financial Resource Strain (CARDIA)    Difficulty of Paying Living Expenses: Not hard at all  Food Insecurity: No [place] (11/03/2020)   Hunger Vital Sign    Worried About Running Out of Food in the Last Year: Never true    [place] in the Last Year: Never true  Transportation Needs: No Transportation Needs (11/03/2020)   PRAPARE - Hydrologist (Medical): No    Lack of Transportation (Non-Medical): No  Physical Activity:  Inactive (11/03/2020)   Exercise Vital Sign    Days of Exercise per Week: 0 days    Minutes of Exercise per Session: 0 min  Stress: No Stress Concern Present (11/03/2020)   [place]    Feeling of Stress : Not at all  Social Connections: Socially Isolated (11/03/2020)   Social Connection and Isolation Panel [NHANES]    Frequency of Communication with Friends and Family: Twice a week    Frequency of Social Gatherings with Friends and Family: Once a week    Attends Religious Services: Never    Marine scientist or Organizations: No    Attends Music therapist: Never    Marital Status: Divorced    Review of Systems: Gen: Denies fever, chills, cold or flulike symptoms, presyncope, syncope..  CV: Denies chest pain, palpitations. Resp: Denies dyspnea, cough.  GI: See HPI Heme: See HPI  Physical Exam: There were no vitals taken for this visit. General:   Alert and oriented. No distress noted. Pleasant and cooperative.  Head:  Normocephalic and atraumatic. Eyes:  Conjuctiva clear without scleral icterus. Heart:  S1, S2 present without murmurs appreciated. Lungs:  Clear to auscultation bilaterally. No wheezes, rales, or rhonchi. No distress.  Abdomen:  +BS, soft, non-tender and non-distended. No rebound or guarding. No HSM or masses noted. Msk:  Symmetrical without gross deformities. Normal posture. Extremities:  Without edema. Neurologic:  Alert and  oriented x4 Psych:  Normal mood and affect.    Assessment:     Plan:  ***   [name], PA-C [facility] 05/10/2022

## 2022-05-10 ENCOUNTER — Ambulatory Visit (INDEPENDENT_AMBULATORY_CARE_PROVIDER_SITE_OTHER): Payer: Medicare Other | Admitting: Gastroenterology

## 2022-05-10 ENCOUNTER — Encounter: Payer: Self-pay | Admitting: Gastroenterology

## 2022-05-10 VITALS — BP 131/79 | HR 71 | Temp 97.9°F | Ht 67.0 in | Wt 220.8 lb

## 2022-05-10 DIAGNOSIS — R110 Nausea: Secondary | ICD-10-CM | POA: Diagnosis not present

## 2022-05-10 DIAGNOSIS — K649 Unspecified hemorrhoids: Secondary | ICD-10-CM | POA: Diagnosis not present

## 2022-05-10 DIAGNOSIS — R21 Rash and other nonspecific skin eruption: Secondary | ICD-10-CM

## 2022-05-10 DIAGNOSIS — K5900 Constipation, unspecified: Secondary | ICD-10-CM | POA: Diagnosis not present

## 2022-05-10 DIAGNOSIS — K6289 Other specified diseases of anus and rectum: Secondary | ICD-10-CM

## 2022-05-10 DIAGNOSIS — R1310 Dysphagia, unspecified: Secondary | ICD-10-CM | POA: Insufficient documentation

## 2022-05-10 NOTE — Patient Instructions (Signed)
Use over the counter clotrimazole cream twice daily for 7 days on the rash between your buttocks then stop. You can use again as needed if the rash returns.   Stop diltiazem rectal cream as you do not have an anal fissure at this time.  You can use your over-the-counter hemorrhoid cream as you need it, but no need to use this every day.  Only if you are having flares of hemorrhoid symptoms such as rectal discomfort or bleeding.  Continue taking MiraLAX nightly and your chewable fiber supplement daily.  Continue taking omeprazole 20 mg every day.   We will follow-up with you in 6 months or sooner if needed.  It was good to see you again today!  [name], PA-C [facility]

## 2022-06-05 ENCOUNTER — Telehealth: Payer: Self-pay | Admitting: *Deleted

## 2022-06-05 ENCOUNTER — Telehealth: Payer: Self-pay | Admitting: Cardiovascular Disease

## 2022-06-05 ENCOUNTER — Telehealth: Payer: Self-pay | Admitting: Psychiatry

## 2022-06-05 NOTE — Telephone Encounter (Signed)
Pt said having headaches, dizziness, nauseated. Would like a call from the nurse.

## 2022-06-05 NOTE — Telephone Encounter (Signed)
  Pt said, he's been feeling weak, loss his appetite and last week he felt on and off heart flutter. He forgot to check his BP

## 2022-06-05 NOTE — Telephone Encounter (Signed)
Pt called and states he has been having migraines for 3 days. When he gets margarines his stomach hurts and he is nauseated and has diarrhea. He states he has a ringing in his right ear. I informed him that he should call his neurologist or PCP to try and get and OV to see them because we don't treat migraines.

## 2022-06-05 NOTE — Telephone Encounter (Signed)
I spoke with Steven [name]. he has had loss of appetite, not eating and has had brown colored diarrhea for the past 2 days. He denies fever or abdominal pain. He reports occasional flutter sensation in his chest. He has history of PAF and is on Eliquis.  He saw GI in October and was given omeprazole. They recommended he have endoscopy but he declined. I asked him to reach back out to GI today.   He agrees to do so.

## 2022-06-06 NOTE — Telephone Encounter (Signed)
Noted  

## 2022-06-06 NOTE — Telephone Encounter (Signed)
Contacted pt back, LVM rq call back

## 2022-06-06 NOTE — Telephone Encounter (Signed)
Agree. Sorry, we aren't able to help with migraines!

## 2022-06-06 NOTE — Telephone Encounter (Signed)
Pt returned call, stated he has been having headaches, dizziness, nauseated since about Friday night.  Confirmed he is taking Ubrelvy as needed but he was not taking Cymbalta.  He Took Ubrelvy last night and was feeling better this morning. Advised he start taking Cymbalta as directed for headache improvement. He verbally understood and was appreciative.

## 2022-06-07 ENCOUNTER — Telehealth: Payer: Self-pay | Admitting: Psychiatry

## 2022-06-07 ENCOUNTER — Ambulatory Visit
Admission: EM | Admit: 2022-06-07 | Discharge: 2022-06-07 | Disposition: A | Discharge status: Home / Self Care | Payer: Medicare Other | Attending: Nurse Practitioner | Admitting: Nurse Practitioner

## 2022-06-07 DIAGNOSIS — R110 Nausea: Secondary | ICD-10-CM

## 2022-06-07 MED ORDER — ONDANSETRON HCL 4 MG PO TABS: 4.0000 mg | ORAL_TABLET | Freq: Three times a day (TID) | ORAL | 0 refills | Status: DC | PRN

## 2022-06-07 NOTE — Telephone Encounter (Addendum)
Pt called wanting to discuss his Ubrogepant (UBRELVY) 100 MG TABS with RN. Pt states that he has been nauseated for several days and is not sure if its the migraine medication causing the nausea. Please advise.

## 2022-06-07 NOTE — ED Provider Notes (Signed)
[facility]    CSN: [id number] Arrival date & time: 06/07/22  1303      History   Chief Complaint Chief Complaint  Patient presents with   Nausea    HPI Steven [name] is a 71 y.o. male.   The history is provided by the patient.   Patient presents for complaints of nausea, diarrhea, constipation, and headaches.  Patient has a history of migraines, and IBS, and is currently under the care of specialist.  Patient states that he started new medication for migraines, and was wondering if this was related to his symptoms.  Patient was experiencing diarrhea previously, but states this is since changed to constipation.  Patient also takes medication for neuropathic pain, duloxetine.  Patient states that he has not seen his neurologist or gastroenterologist since the symptoms started.  He denies fever, chills, vomiting, or urinary symptoms.  Patient states that he took Zofran over the last several days to help with his nausea.  He is also on Eliquis for atrial fib and atrial flutter.  Past Medical History:  Diagnosis Date   Anxiety    Arthritis    Atrial fibrillation (HCC)    Atrial flutter (HCC)    BPH (benign prostatic hyperplasia)    Chest pain    Dizziness    Dyspnea    laying down occ   Fracture 08/17/2015   MULTIPLE RIB FRACTURES     FROM FALL    GERD (gastroesophageal reflux disease)    Hemorrhoids    History of kidney stones    noted on CT scan   History of radiation therapy 08/22/11-10/13/11   prostate   Hyperlipemia    Hypertension    Hyperthyroidism    IBS (irritable bowel syndrome)    Insomnia    Light headedness    Migraine    Numbness and tingling in left arm    Numbness and tingling of both legs    Open fracture of left elbow 08/18/2015   Prostate cancer [facility])    prostate s/p radiation Mar 2013   Rib fractures 08/17/2015   Tinnitus     Patient Active Problem List   Diagnosis Date Noted   Rash and nonspecific skin eruption 05/10/2022    Dysphagia 05/10/2022   Chronic pain 07/07/2020   Encounter to establish care 07/07/2020   Anxiety 07/07/2020   Carcinoma of prostate ([place]) 02/17/2020   Subacute thyroiditis 01/07/2020   Personal history of malignant neoplasm of prostate 12/12/2019   Benign prostatic hyperplasia with urinary obstruction 12/12/2019   Weak urinary stream 12/12/2019   Hyperthyroidism 11/24/2019   Weight loss 10/21/2019   IBS (irritable bowel syndrome) 10/21/2019   Dizzy spells 09/20/2019   Intractable chronic migraine without aura 09/20/2019   Obstructive sleep apnea syndrome 09/20/2019   Psychophysiologic insomnia 09/20/2019   Rectal pain 08/21/2019   Nausea without vomiting 05/30/2019   Atrial flutter ([place]) 04/17/2019   Chronic atrial fibrillation ([place]) 03/02/2019   HTN (hypertension) 03/02/2019   Constipation 08/02/2017   Hemorrhoids 08/02/2017   Fall 08/18/2015   Radiation proctitis 01/30/2013   Rectal bleeding 12/10/2012   Obese 05/30/2012   Malignant neoplasm of prostate ([place]) 08/13/2011    Past Surgical History:  Procedure Laterality Date   BOTOX INJECTION N/A 01/14/2020   Procedure: INJECTION OF BOTOX INTO ANAL SPHINCTER;  Surgeon: Ileana Roup, MD;  Location: [facility];  Service: General;  Laterality: N/A;   COLONOSCOPY N/A 12/19/2012   LEX:NTZGYF bleeding secondary to radiation induced proctitis  -  status post APC ablation; internal hemorrhoids. Normal appearing colon   COLONOSCOPY WITH PROPOFOL N/A 10/23/2019   Procedure: COLONOSCOPY WITH PROPOFOL;  Surgeon: Daneil Dolin, MD; external and grade 2 internal hemorrhoids, abnormal rectal blood vessels consistent with radiation proctitis s/p APC therapy, otherwise normal exam.   EVALUATION UNDER ANESTHESIA WITH ANAL FISTULECTOMY N/A 01/14/2020   Procedure: ANORECTAL EXAM UNDER ANESTHESIA;  Surgeon: Ileana Roup, MD;  Location: [facility];  Service: General;  Laterality: N/A;   HOT HEMOSTASIS  10/23/2019   Procedure: HOT HEMOSTASIS  (ARGON PLASMA COAGULATION/BICAP);  Surgeon: Daneil Dolin, MD;  Location: [facility];  Service: Endoscopy;;  apc rectal proctitis     KIDNEY SURGERY  1982   kidney tube collapse repair   RADIOACTIVE SEED IMPLANT     Prostate       Home Medications    Prior to Admission medications   Medication Sig Start Date End Date Taking? Authorizing Provider  ondansetron (ZOFRAN) 4 MG tablet Take 1 tablet (4 mg total) by mouth every 8 (eight) hours as needed for nausea or vomiting. 06/07/22  Yes Martika Egler-Warren, Alda Lea, NP  alfuzosin (UROXATRAL) 10 MG 24 hr tablet Take 1 tablet (10 mg total) by mouth daily with breakfast. 04/07/22   [name], Candee Furbish, MD  amLODipine (NORVASC) 10 MG tablet Take 1 tablet by mouth daily. 12/08/20   [provider]  carvedilol (COREG) 6.25 MG tablet Take 1 tablet (6.25 mg total) by mouth 2 (two) times daily. 07/04/21   Josue Hector, MD  Cholecalciferol (VITAMIN D3) 125 MCG (5000 UT) CAPS Take 1 capsule by mouth daily.     [provider]  clotrimazole-betamethasone (LOTRISONE) cream Apply 1 application topically 2 (two) times daily. 02/17/21   [provider]  Cyanocobalamin (B-12 PO) B12    [provider]  diazepam (VALIUM) 5 MG tablet Take 5 mg by mouth daily as needed for anxiety. Patient not taking: Reported on 05/10/2022    [provider]  DULoxetine (CYMBALTA) 20 MG capsule Take 1 capsule (20 mg total) by mouth daily. Patient not taking: Reported on 05/10/2022 04/24/22   Genia Harold, MD  ELIQUIS 5 MG TABS tablet TAKE 1 TABLET(5 MG) BY MOUTH TWICE DAILY 04/24/22   Josue Hector, MD  Erenumab-aooe (AIMOVIG) 70 MG/ML SOAJ Inject 1 mL into the skin every 30 (thirty) days.    [provider]  fluticasone (FLONASE) 50 MCG/ACT nasal spray fluticasone propionate 50 mcg/actuation nasal spray,suspension  SHAKE LIQUID AND USE 1 SPRAY IN EACH NOSTRIL EVERY DAY    [provider]  gabapentin  (NEURONTIN) 300 MG capsule Take 300 mg by mouth 3 (three) times daily. 02/24/20   [provider]  hydrochlorothiazide (HYDRODIURIL) 25 MG tablet Take 25 mg by mouth daily. 07/28/19   [provider]  losartan (COZAAR) 100 MG tablet Take 100 mg by mouth daily. 01/05/21   [provider]  NEXLETOL 180 MG TABS Take 180 mg by mouth daily. Pt taking 2-3 times a week 12/03/19   [provider]  omeprazole (PRILOSEC) 20 MG capsule Take 1 capsule (20 mg total) by mouth 2 (two) times daily before a meal. Patient taking differently: Take 20 mg by mouth 2 (two) times daily before a meal. Taking once daily 10/07/21   Erenest Rasher, PA-C  ondansetron (ZOFRAN ODT) 4 MG disintegrating tablet Take 1 tablet (4 mg total) by mouth every 8 (eight) hours as needed for nausea or vomiting. 06/08/20  Avegno, Darrelyn Hillock, FNP  polyethylene glycol (MIRALAX / GLYCOLAX) 17 g packet Miralax    [provider]  potassium chloride SA (KLOR-CON) 20 MEQ tablet Take 20 mEq by mouth 2 (two) times daily. 12/09/19   [provider]  Probiotic Product (PROBIOTIC-10 PO) Take 1 capsule by mouth daily.    [provider]  tadalafil (CIALIS) 20 MG tablet Take 1 tablet (20 mg total) by mouth daily as needed. Patient not taking: Reported on 05/10/2022 09/20/20   Cleon Gustin, MD  tadalafil (CIALIS) 5 MG tablet Take 1 tablet (5 mg total) by mouth daily. Patient not taking: Reported on 05/10/2022 05/03/22   Cleon Gustin, MD  Ubrogepant (UBRELVY) 100 MG TABS Take 100 mg by mouth as needed (for migraine). May repeat a dose in 2 hours if headache persists. Max dose 2 pills in 24 hours Patient not taking: Reported on 05/10/2022 04/24/22   Genia Harold, MD    Family History Family History  Problem Relation Age of Onset   Aneurysm Mother        aortic   Hypertension Mother    Headache Mother    Prostate cancer Father    Hypertension Father    Colon cancer Neg Hx     Colon polyps Neg Hx     Social History Social History   Tobacco Use   Smoking status: Former    Packs/day: 1.00    Years: 20.00    Total pack years: 20.00    Types: Cigarettes    Quit date: 2020    Years since quitting: 3.8   Smokeless tobacco: Never   Tobacco comments:    Quit smoking x 2 years    07/2015   SOMETIIMES i USE VAPOR   Vaping Use   Vaping Use: Never used  Substance Use Topics   Alcohol use: Never   Drug use: Never     Allergies   Latex and Meclizine   Review of Systems Review of Systems Per HPI  Physical Exam Triage Vital Signs ED Triage Vitals  Enc Vitals Group     BP 06/07/22 1336 117/71     Pulse Rate 06/07/22 1336 70     Resp 06/07/22 1336 16     Temp 06/07/22 1336 98.5 F (36.9 C)     Temp Source 06/07/22 1336 Oral     SpO2 06/07/22 1336 94 %     Weight --      Height --      Head Circumference --      Peak Flow --      Pain Score 06/07/22 1334 0     Pain Loc --      Pain Edu? --      Excl. in [place]? --    No data found.  Updated Vital Signs BP 117/71 (BP Location: Right Arm)   Pulse 70   Temp 98.5 F (36.9 C) (Oral)   Resp 16   SpO2 94%   Visual Acuity Right Eye Distance:   Left Eye Distance:   Bilateral Distance:    Right Eye Near:   Left Eye Near:    Bilateral Near:     Physical Exam Vitals and nursing note reviewed.  Constitutional:      General: He is not in acute distress.    Appearance: Normal appearance.  HENT:     Head: Normocephalic.     Mouth/Throat:     Mouth: Mucous membranes are dry.  Eyes:  Extraocular Movements: Extraocular movements intact.     Pupils: Pupils are equal, round, and reactive to light.  Cardiovascular:     Rate and Rhythm: Normal rate and regular rhythm.     Pulses: Normal pulses.     Heart sounds: Normal heart sounds.  Pulmonary:     Effort: Pulmonary effort is normal.     Breath sounds: Normal breath sounds.  Abdominal:     General: Bowel sounds are normal.     Palpations:  Abdomen is soft.     Tenderness: There is no abdominal tenderness.  Musculoskeletal:     Cervical back: Normal range of motion.  Lymphadenopathy:     Cervical: No cervical adenopathy.  Skin:    General: Skin is warm and dry.  Neurological:     General: No focal deficit present.     Mental Status: He is alert and oriented to person, place, and time.  Psychiatric:        Mood and Affect: Mood normal.        Behavior: Behavior normal.      UC Treatments / Results  Labs (all labs ordered are listed, but only abnormal results are displayed) Labs Reviewed - No data to display  EKG   Radiology No results found.  Procedures Procedures (including critical care time)  Medications Ordered in UC Medications - No data to display  Initial Impression / Assessment and Plan / UC Course  I have reviewed the triage vital signs and the nursing notes.  Pertinent labs & imaging results that were available during my care of the patient were reviewed by me and considered in my medical decision making (see chart for details).  Reviewed the patient's chart to determine chronicity of ongoing symptoms.  Patient was seen by his neurologist in October for the same or similar symptoms, and had recently been seen by gastroenterology for the same.  Because patient is already under the care of specialist, advised him that he should follow-up with them for reevaluation, given that he has been started on new medications by his neurologist.  There is no concern for acute abdomen as patient's vital signs are stable, and he is in no acute distress.  Patient was prescribed Zofran 4 mg for his nausea.  Recommended follow-up with his specialist for ongoing symptoms.  Nausea without vomiting  Zofran 4 mg for nausea. Increase fluids and allow for plenty of rest. Recommend Tylenol to help with pain or discomfort. As discussed, please follow-up with your specialist, gastroenterologist and neurologist for ongoing or  continued symptoms. Go to the emergency department for any worsening symptoms to include vomiting, chest pain, diarrhea, worsening abdominal pain, or other concerns. Follow-up as needed. Final Clinical Impressions(s) / UC Diagnoses   Final diagnoses:  Nausea without vomiting     Discharge Instructions      Take medication as prescribed. Increase fluids and allow for plenty of rest. As discussed, you will need to follow-up with your gastroenterologist for your IBS symptoms and your nausea, and for your neurologist for ongoing headache. May take over-the-counter Tylenol as needed for pain or discomfort. If symptoms do not improve or if they suddenly worsen, please go to the emergency department for further evaluation. Follow-up as needed.      ED Prescriptions     Medication Sig Dispense Auth. Provider   ondansetron (ZOFRAN) 4 MG tablet Take 1 tablet (4 mg total) by mouth every 8 (eight) hours as needed for nausea or vomiting. 20 tablet Viviano Bir-Warren,  Alda Lea, NP      PDMP not reviewed this encounter.   Tish Men, NP 06/07/22 1450

## 2022-06-07 NOTE — Discharge Instructions (Addendum)
Take medication as prescribed. Increase fluids and allow for plenty of rest. As discussed, you will need to follow-up with your gastroenterologist for your IBS symptoms and your nausea, and for your neurologist for ongoing headache. May take over-the-counter Tylenol as needed for pain or discomfort. If symptoms do not improve or if they suddenly worsen, please go to the emergency department for further evaluation. Follow-up as needed.

## 2022-06-07 NOTE — Telephone Encounter (Signed)
Steven [name] typically does not cause nausea as long as he is limiting to a max of 2 pills in 24 hours. He can take the Zofran that the ED prescribed every 8 hours for nausea

## 2022-06-07 NOTE — ED Triage Notes (Signed)
Pt states nausea with no appetite and body aches. States he just started on Ryblses for migraines and is not sure if that is making him sick.

## 2022-06-07 NOTE — Telephone Encounter (Signed)
I spoke to this pt Monday, he was having headache with nausea and wasn't feeling well over the weekend. He reported improvement Monday. He confirmed not taking Qulipta, I advised him to take it as directed to help with headaches. What else do you advise?

## 2022-06-07 NOTE — Telephone Encounter (Signed)
Contacted pt back, informed him MD recommendations. Also advised to try to eat something light like crackers when taking meds so it doesn't upset his stomach more. Pt verbally understood and was apperceive.

## 2022-06-08 ENCOUNTER — Telehealth: Payer: Self-pay | Admitting: Gastroenterology

## 2022-06-08 NOTE — Telephone Encounter (Signed)
Spoke to pt, informed him of recommendations. Pt voice understanding. Informed to call next week with an update.

## 2022-06-08 NOTE — Telephone Encounter (Signed)
Reviewed Steven [name]'s last OV note. Predominately has IBS-C. H/o nausea related to migraines.   He should use migraine medications as instructed by neurology.  He can use zofran '4mg'$  every 8 hours as needed, he was given RX yesterday.  Hold miralax.  OK to use imodium 1/2 to 1 tablet no more than twice in 24 hours for next day or two if persistent diarrhea.   Will need return ov if ongoing symptoms.

## 2022-06-08 NOTE — Telephone Encounter (Signed)
Patient left a message asking to speak to a nurse.  He didn't say anything other than that.

## 2022-06-08 NOTE — Telephone Encounter (Signed)
FYI routing to you in absence of [place], [place]. Pt called and states he is still sick to his stomach. He states feeling nauseated and having some diarrhea. He states this happened's when he gets a migraine. States he called his neurologist and they told him to keep taking his medications and call back in a couple to let them know how he is feeling. No fever or vomiting.

## 2022-06-09 ENCOUNTER — Emergency Department (HOSPITAL_COMMUNITY): Payer: Medicare Other

## 2022-06-09 ENCOUNTER — Encounter (HOSPITAL_COMMUNITY): Payer: Self-pay | Admitting: Emergency Medicine

## 2022-06-09 ENCOUNTER — Other Ambulatory Visit: Payer: Self-pay

## 2022-06-09 ENCOUNTER — Other Ambulatory Visit: Payer: Self-pay | Admitting: Psychiatry

## 2022-06-09 ENCOUNTER — Emergency Department (HOSPITAL_COMMUNITY)
Admission: EM | Admit: 2022-06-09 | Discharge: 2022-06-09 | Disposition: A | Discharge status: Home / Self Care | Payer: Medicare Other | Attending: Emergency Medicine | Admitting: Emergency Medicine

## 2022-06-09 DIAGNOSIS — R739 Hyperglycemia, unspecified: Secondary | ICD-10-CM | POA: Diagnosis not present

## 2022-06-09 DIAGNOSIS — Z91040 Latex allergy status: Secondary | ICD-10-CM | POA: Diagnosis not present

## 2022-06-09 DIAGNOSIS — Z1152 Encounter for screening for COVID-19: Secondary | ICD-10-CM | POA: Diagnosis not present

## 2022-06-09 DIAGNOSIS — I10 Essential (primary) hypertension: Secondary | ICD-10-CM | POA: Diagnosis not present

## 2022-06-09 DIAGNOSIS — R630 Anorexia: Secondary | ICD-10-CM | POA: Diagnosis not present

## 2022-06-09 DIAGNOSIS — I4891 Unspecified atrial fibrillation: Secondary | ICD-10-CM | POA: Diagnosis not present

## 2022-06-09 DIAGNOSIS — R197 Diarrhea, unspecified: Secondary | ICD-10-CM | POA: Insufficient documentation

## 2022-06-09 DIAGNOSIS — D72819 Decreased white blood cell count, unspecified: Secondary | ICD-10-CM | POA: Diagnosis not present

## 2022-06-09 DIAGNOSIS — Z7901 Long term (current) use of anticoagulants: Secondary | ICD-10-CM | POA: Diagnosis not present

## 2022-06-09 DIAGNOSIS — R110 Nausea: Secondary | ICD-10-CM | POA: Insufficient documentation

## 2022-06-09 DIAGNOSIS — R531 Weakness: Secondary | ICD-10-CM | POA: Diagnosis present

## 2022-06-09 DIAGNOSIS — R0602 Shortness of breath: Secondary | ICD-10-CM | POA: Diagnosis not present

## 2022-06-09 DIAGNOSIS — Z79899 Other long term (current) drug therapy: Secondary | ICD-10-CM | POA: Insufficient documentation

## 2022-06-09 DIAGNOSIS — R0789 Other chest pain: Secondary | ICD-10-CM | POA: Diagnosis not present

## 2022-06-09 DIAGNOSIS — Z8546 Personal history of malignant neoplasm of prostate: Secondary | ICD-10-CM | POA: Diagnosis not present

## 2022-06-09 LAB — COMPREHENSIVE METABOLIC PANEL
ALT: 19 U/L (ref 0–44)
AST: 22 U/L (ref 15–41)
Albumin: 4.4 g/dL (ref 3.5–5.0)
Alkaline Phosphatase: 47 U/L (ref 38–126)
Anion gap: 10 (ref 5–15)
BUN: 8 mg/dL (ref 8–23)
CO2: 27 mmol/L (ref 22–32)
Calcium: 9.8 mg/dL (ref 8.9–10.3)
Chloride: 99 mmol/L (ref 98–111)
Creatinine, Ser: 0.95 mg/dL (ref 0.61–1.24)
GFR, Estimated: >60 mL/min (ref >60)
Glucose, Bld: 100 mg/dL — ABNORMAL HIGH (ref 70–99)
Potassium: 3.6 mmol/L (ref 3.5–5.1)
Sodium: 136 mmol/L (ref 135–145)
Total Bilirubin: 1 mg/dL (ref 0.3–1.2)
Total Protein: 7.9 g/dL (ref 6.5–8.1)

## 2022-06-09 LAB — CBC WITH DIFFERENTIAL/PLATELET
Abs Immature Granulocytes: 0.01 10*3/uL (ref 0.00–0.07)
Basophils Absolute: 0 10*3/uL (ref 0.0–0.1)
Basophils Relative: 1 %
Eosinophils Absolute: 0.1 10*3/uL (ref 0.0–0.5)
Eosinophils Relative: 2 %
HCT: 41.8 % (ref 39.0–52.0)
Hemoglobin: 14.3 g/dL (ref 13.0–17.0)
Immature Granulocytes: 0 %
Lymphocytes Relative: 39 %
Lymphs Abs: 1.3 10*3/uL (ref 0.7–4.0)
MCH: 29.6 pg (ref 26.0–34.0)
MCHC: 34.2 g/dL (ref 30.0–36.0)
MCV: 86.5 fL (ref 80.0–100.0)
Monocytes Absolute: 0.3 10*3/uL (ref 0.1–1.0)
Monocytes Relative: 8 %
Neutro Abs: 1.6 10*3/uL — ABNORMAL LOW (ref 1.7–7.7)
Neutrophils Relative %: 50 %
Platelets: 266 10*3/uL (ref 150–400)
RBC: 4.83 MIL/uL (ref 4.22–5.81)
RDW: 12 % (ref 11.5–15.5)
WBC: 3.2 10*3/uL — ABNORMAL LOW (ref 4.0–10.5)
nRBC: 0 % (ref 0.0–0.2)

## 2022-06-09 LAB — CBG MONITORING, ED: Glucose-Capillary: 105 mg/dL — ABNORMAL HIGH (ref 70–99)

## 2022-06-09 LAB — RESP PANEL BY RT-PCR (FLU A&B, COVID) ARPGX2
Influenza A by PCR: NEGATIVE
Influenza B by PCR: NEGATIVE
SARS Coronavirus 2 by RT PCR: NEGATIVE

## 2022-06-09 LAB — TROPONIN I (HIGH SENSITIVITY): Troponin I (High Sensitivity): 10 ng/L (ref <18)

## 2022-06-09 MED ORDER — METOCLOPRAMIDE HCL 5 MG PO TABS: 5.0000 mg | ORAL_TABLET | Freq: Three times a day (TID) | ORAL | 0 refills | Status: DC | PRN

## 2022-06-09 MED ORDER — NURTEC 75 MG PO TBDP: 75.0000 mg | ORAL_TABLET | ORAL | 6 refills | Status: DC | PRN

## 2022-06-09 MED ORDER — LACTATED RINGERS IV BOLUS
1000.0000 mL | Freq: Once | INTRAVENOUS | Status: AC
  Administered 2022-06-09: 1000 mL via INTRAVENOUS

## 2022-06-09 NOTE — Discharge Instructions (Signed)
If you develop abdominal pain, uncontrolled vomiting, fever, chest or back pain, or any other new/concerning symptoms then return to the ER for evaluation.  

## 2022-06-09 NOTE — ED Provider Notes (Signed)
[facility] Provider Note   CSN: [id number] Arrival date & time: 06/09/22  1100     History  Chief Complaint  Patient presents with   Weakness    Steven [name] is a 71 y.o. male.  HPI 71 year old male with multiple comorbidities which includes atrial fibrillation, hypertension, IBS, hyperlipidemia, prostate cancer presents with nausea and diarrhea and lack of appetite.  This has been ongoing for about 6 days.  He is feeling generally weak and feels like he is dehydrated.  He is forcing himself to eat some and has been drinking.  He has had diarrhea which at times has been about 4 times a day and today he is only gone twice.  No blood in his stools.  No abdominal pain.  He has had some nausea but no vomiting.  Occasionally he will get on and off chest tightness, most recently yesterday as well as shortness of breath.  He has had some on and off palpitations as well and has a history of A-fib.  Home Medications Prior to Admission medications   Medication Sig Start Date End Date Taking? Authorizing Provider  metoCLOPramide (REGLAN) 5 MG tablet Take 1 tablet (5 mg total) by mouth every 8 (eight) hours as needed for nausea (or headache). 06/09/22  Yes Sherwood Gambler, MD  alfuzosin (UROXATRAL) 10 MG 24 hr tablet Take 1 tablet (10 mg total) by mouth daily with breakfast. 04/07/22   [name], Candee Furbish, MD  amLODipine (NORVASC) 10 MG tablet Take 1 tablet by mouth daily. 12/08/20   [provider]  carvedilol (COREG) 6.25 MG tablet Take 1 tablet (6.25 mg total) by mouth 2 (two) times daily. 07/04/21   Josue Hector, MD  Cholecalciferol (VITAMIN D3) 125 MCG (5000 UT) CAPS Take 1 capsule by mouth daily.     [provider]  clotrimazole-betamethasone (LOTRISONE) cream Apply 1 application topically 2 (two) times daily. 02/17/21   [provider]  Cyanocobalamin (B-12 PO) B12    [provider]  diazepam (VALIUM) 5 MG tablet Take 5 mg by  mouth daily as needed for anxiety. Patient not taking: Reported on 05/10/2022    [provider]  DULoxetine (CYMBALTA) 20 MG capsule Take 1 capsule (20 mg total) by mouth daily. Patient not taking: Reported on 05/10/2022 04/24/22   Genia Harold, MD  ELIQUIS 5 MG TABS tablet TAKE 1 TABLET(5 MG) BY MOUTH TWICE DAILY 04/24/22   Josue Hector, MD  Erenumab-aooe (AIMOVIG) 70 MG/ML SOAJ Inject 1 mL into the skin every 30 (thirty) days.    [provider]  fluticasone (FLONASE) 50 MCG/ACT nasal spray fluticasone propionate 50 mcg/actuation nasal spray,suspension  SHAKE LIQUID AND USE 1 SPRAY IN EACH NOSTRIL EVERY DAY    [provider]  gabapentin (NEURONTIN) 300 MG capsule Take 300 mg by mouth 3 (three) times daily. 02/24/20   [provider]  hydrochlorothiazide (HYDRODIURIL) 25 MG tablet Take 25 mg by mouth daily. 07/28/19   [provider]  losartan (COZAAR) 100 MG tablet Take 100 mg by mouth daily. 01/05/21   [provider]  NEXLETOL 180 MG TABS Take 180 mg by mouth daily. Pt taking 2-3 times a week 12/03/19   [provider]  omeprazole (PRILOSEC) 20 MG capsule Take 1 capsule (20 mg total) by mouth 2 (two) times daily before a meal. Patient taking differently: Take 20 mg by mouth 2 (two) times daily before a meal. Taking once daily 10/07/21   [name]  S, PA-C  ondansetron (ZOFRAN ODT) 4 MG disintegrating tablet Take 1 tablet (4 mg total) by mouth every 8 (eight) hours as needed for nausea or vomiting. 06/08/20   Avegno, Darrelyn Hillock, FNP  ondansetron (ZOFRAN) 4 MG tablet Take 1 tablet (4 mg total) by mouth every 8 (eight) hours as needed for nausea or vomiting. 06/07/22   Leath-Warren, Alda Lea, NP  polyethylene glycol (MIRALAX / GLYCOLAX) 17 g packet Miralax    [provider]  potassium chloride SA (KLOR-CON) 20 MEQ tablet Take 20 mEq by mouth 2 (two) times daily. 12/09/19   [provider]  Probiotic Product  (PROBIOTIC-10 PO) Take 1 capsule by mouth daily.    [provider]  Rimegepant Sulfate (NURTEC) 75 MG TBDP Take 75 mg by mouth as needed. 06/09/22   Genia Harold, MD  tadalafil (CIALIS) 20 MG tablet Take 1 tablet (20 mg total) by mouth daily as needed. Patient not taking: Reported on 05/10/2022 09/20/20   Cleon Gustin, MD  tadalafil (CIALIS) 5 MG tablet Take 1 tablet (5 mg total) by mouth daily. Patient not taking: Reported on 05/10/2022 05/03/22   Cleon Gustin, MD      Allergies    Latex and Meclizine    Review of Systems   Review of Systems  Constitutional:  Positive for appetite change. Negative for fever.  Respiratory:  Positive for chest tightness and shortness of breath.   Cardiovascular:  Positive for palpitations. Negative for chest pain.  Gastrointestinal:  Positive for diarrhea and nausea. Negative for abdominal pain, blood in stool and vomiting.  Neurological:  Positive for weakness.    Physical Exam Updated Vital Signs BP (!) 140/81   Pulse 69   Temp 98.1 F (36.7 C) (Oral)   Resp 18   Ht '5\' 7"'$  (1.702 m)   Wt 99.3 kg   SpO2 100%   BMI 34.30 kg/m  Physical Exam Vitals and nursing note reviewed.  Constitutional:      General: He is not in acute distress.    Appearance: He is well-developed. He is not ill-appearing or diaphoretic.  HENT:     Head: Normocephalic and atraumatic.  Cardiovascular:     Rate and Rhythm: Normal rate and regular rhythm.     Heart sounds: Normal heart sounds.  Pulmonary:     Effort: Pulmonary effort is normal.     Breath sounds: Normal breath sounds.  Abdominal:     Palpations: Abdomen is soft.     Tenderness: There is no abdominal tenderness.  Skin:    General: Skin is warm and dry.  Neurological:     Mental Status: He is alert.     ED Results / Procedures / Treatments   Labs (all labs ordered are listed, but only abnormal results are displayed) Labs Reviewed  COMPREHENSIVE METABOLIC PANEL -  Abnormal; Notable for the following components:      Result Value   Glucose, Bld 100 (*)    All other components within normal limits  CBC WITH DIFFERENTIAL/PLATELET - Abnormal; Notable for the following components:   WBC 3.2 (*)    Neutro Abs 1.6 (*)    All other components within normal limits  CBG MONITORING, ED - Abnormal; Notable for the following components:   Glucose-Capillary 105 (*)    All other components within normal limits  RESP PANEL BY RT-PCR (FLU A&B, COVID) [id number]  TROPONIN I (HIGH SENSITIVITY)    EKG EKG Interpretation  Date/Time:  Friday June 09 2022 12:19:47 EST Ventricular Rate:  73 PR Interval:  164 QRS Duration: 84 QT Interval:  358 QTC Calculation: 394 R Axis:   6 Text Interpretation: Normal sinus rhythm no acute ST/T changes artifact in V5-6 Confirmed by Sherwood Gambler [phone]) on 06/09/2022 12:54:26 PM  Radiology DG Chest 2 View  Result Date: 06/09/2022 CLINICAL DATA:  Chest tightness EXAM: CHEST - 2 VIEW COMPARISON:  CXR 01/07/22 FINDINGS: No pleural effusion. No pneumothorax. Normal cardiac and mediastinal contours. No acute displaced rib fracture. Compared to prior exam the left hemidiaphragm is poorly visualized, likely secondary to the presence of a left basilar pulmonary opacity. IMPRESSION: Likely left basilar atelectasis. Electronically Signed   By: Marin Roberts M.D.   On: 06/09/2022 12:28    Procedures Procedures    Medications Ordered in ED Medications  lactated ringers bolus 1,000 mL (0 mLs Intravenous Stopped 06/09/22 1458)    ED Course/ Medical Decision Making/ A&P                           Medical Decision Making Amount and/or Complexity of Data Reviewed Labs: ordered.    Details: Slight hyperglycemia but not clinically significant.  Mild leukopenia of unclear etiology.  Normal COVID/flu testing and otherwise normal electrolytes. Radiology: ordered and independent interpretation performed.    Details: No  pneumonia. ECG/medicine tests: independent interpretation performed.    Details: No acute ischemia.  Risk Prescription drug management.   Patient presents with generally not feeling well and some nausea.  However abdominal exam is benign and on multiple examinations there is no tenderness.  He does also have chronic migraines and thinks that the new medicine he was given might be causing some of his nausea from side effects.  Discussed he will ultimately need to follow-up with his neurologist but given he is already on ondansetron we can try a low dose of Reglan based on his age.  Otherwise, I do not think emergent imaging of his abdomen is needed and at this point with a benign work-up he can be discharged home.  He is feeling better with fluids.        Final Clinical Impression(s) / ED Diagnoses Final diagnoses:  Nausea    Rx / DC Orders ED Discharge Orders          Ordered    metoCLOPramide (REGLAN) 5 MG tablet  Every 8 hours PRN        06/09/22 1457              Sherwood Gambler, MD 06/09/22 1529

## 2022-06-09 NOTE — ED Triage Notes (Signed)
Pt to ED c/o nausea, no appetite and generalizes weakness x 1 week. Evaluated at urgent care for the same, reports taking nausea medication with minimal relief.

## 2022-06-09 NOTE — Telephone Encounter (Signed)
Pt said research the medications and found a interaction with carvedilol (COREG) 6.25 MG tablet  and Ubrogepant (UBRELVY) 100 MG TABS . This may be what is making me nauseated, weakness in leg, fatigue, no appetite. Pt said may go the ER. Pt asking if there's something else can prescribed.

## 2022-06-12 ENCOUNTER — Telehealth: Payer: Self-pay | Admitting: Neurology

## 2022-06-12 ENCOUNTER — Telehealth: Payer: Self-pay | Admitting: Psychiatry

## 2022-06-12 NOTE — Telephone Encounter (Signed)
Called the patient back. Pt states he has been feeling extremely nauseous. He wasn't sure if it was related to the medications. He went to the ER and was evaluated. They dc him home with reglan to take and the patient states he has not started it yet. Advised the patient that the hope is that medication will help with nausea along with helping boost appetite. Pt questioned about the nurtec (must have previously been prescribed to take every other day) I advised now he is to only take that on as needed basis. Advised the patient the Roselyn Meier he would stop and nurtec would replace the ubrelvy.  He has not been taking the gabapentin as prescribed. Advised that it appears the gabapentin and aimovig are considered his preventative regimen and the nurtec would be as needed.  I was on the phone for 20 minutes explaining this information. He states he was told the nurtec was waiting for an approval. Advised likely they are needing a PA completed. Advised I would initiate that for him. Pt verbalized understanding. Pt had no questions at this time but was encouraged to call back if questions arise.

## 2022-06-12 NOTE — Telephone Encounter (Signed)
PA submitted for the pt for nurtec on [facility] rx KEY: [id number]  Reference #: PA [id number] Approved for the pt through til dec 31,2024

## 2022-06-12 NOTE — Telephone Encounter (Signed)
Pt is calling. Requesting a refill on medication Rimegepant Sulfate (NURTEC) 75 MG TBDP. Refill should be sent to  [facility] #[id number].  Pt is also requesting a call-back from the nurse. Stated he is still having headaches and feels very nausea. Pt said he went to the hospital over the weekend because of the headaches.

## 2022-06-22 ENCOUNTER — Emergency Department (HOSPITAL_COMMUNITY)
Admission: EM | Admit: 2022-06-22 | Discharge: 2022-06-22 | Disposition: A | Discharge status: Home / Self Care | Payer: Medicare Other | Attending: Emergency Medicine | Admitting: Emergency Medicine

## 2022-06-22 ENCOUNTER — Emergency Department (HOSPITAL_COMMUNITY): Payer: Medicare Other

## 2022-06-22 ENCOUNTER — Encounter (HOSPITAL_COMMUNITY): Payer: Self-pay | Admitting: Emergency Medicine

## 2022-06-22 ENCOUNTER — Other Ambulatory Visit: Payer: Self-pay

## 2022-06-22 DIAGNOSIS — R630 Anorexia: Secondary | ICD-10-CM | POA: Diagnosis not present

## 2022-06-22 DIAGNOSIS — K5900 Constipation, unspecified: Secondary | ICD-10-CM

## 2022-06-22 DIAGNOSIS — R42 Dizziness and giddiness: Secondary | ICD-10-CM | POA: Insufficient documentation

## 2022-06-22 DIAGNOSIS — Z7901 Long term (current) use of anticoagulants: Secondary | ICD-10-CM | POA: Insufficient documentation

## 2022-06-22 DIAGNOSIS — J9811 Atelectasis: Secondary | ICD-10-CM | POA: Diagnosis not present

## 2022-06-22 DIAGNOSIS — K8020 Calculus of gallbladder without cholecystitis without obstruction: Secondary | ICD-10-CM | POA: Diagnosis not present

## 2022-06-22 DIAGNOSIS — Z91040 Latex allergy status: Secondary | ICD-10-CM | POA: Diagnosis not present

## 2022-06-22 LAB — CBC
HCT: 39.8 % (ref 39.0–52.0)
Hemoglobin: 13.5 g/dL (ref 13.0–17.0)
MCH: 29.5 pg (ref 26.0–34.0)
MCHC: 33.9 g/dL (ref 30.0–36.0)
MCV: 87.1 fL (ref 80.0–100.0)
Platelets: 232 10*3/uL (ref 150–400)
RBC: 4.57 MIL/uL (ref 4.22–5.81)
RDW: 12.2 % (ref 11.5–15.5)
WBC: 3.4 10*3/uL — ABNORMAL LOW (ref 4.0–10.5)
nRBC: 0 % (ref 0.0–0.2)

## 2022-06-22 LAB — URINALYSIS, ROUTINE W REFLEX MICROSCOPIC
Bilirubin Urine: NEGATIVE
Glucose, UA: NEGATIVE mg/dL
Hgb urine dipstick: NEGATIVE
Ketones, ur: NEGATIVE mg/dL
Leukocytes,Ua: NEGATIVE
Nitrite: NEGATIVE
Protein, ur: NEGATIVE mg/dL
Specific Gravity, Urine: 1.006 (ref 1.005–1.030)
pH: 6 (ref 5.0–8.0)

## 2022-06-22 LAB — HEPATIC FUNCTION PANEL
ALT: 16 U/L (ref 0–44)
AST: 17 U/L (ref 15–41)
Albumin: 4.1 g/dL (ref 3.5–5.0)
Alkaline Phosphatase: 43 U/L (ref 38–126)
Bilirubin, Direct: 0.2 mg/dL (ref 0.0–0.2)
Indirect Bilirubin: 0.4 mg/dL (ref 0.3–0.9)
Total Bilirubin: 0.6 mg/dL (ref 0.3–1.2)
Total Protein: 7.4 g/dL (ref 6.5–8.1)

## 2022-06-22 LAB — BASIC METABOLIC PANEL
Anion gap: 8 (ref 5–15)
BUN: 8 mg/dL (ref 8–23)
CO2: 27 mmol/L (ref 22–32)
Calcium: 9.7 mg/dL (ref 8.9–10.3)
Chloride: 102 mmol/L (ref 98–111)
Creatinine, Ser: 0.97 mg/dL (ref 0.61–1.24)
GFR, Estimated: >60 mL/min (ref >60)
Glucose, Bld: 113 mg/dL — ABNORMAL HIGH (ref 70–99)
Potassium: 3.2 mmol/L — ABNORMAL LOW (ref 3.5–5.1)
Sodium: 137 mmol/L (ref 135–145)

## 2022-06-22 LAB — TROPONIN I (HIGH SENSITIVITY)
Troponin I (High Sensitivity): 5 ng/L (ref <18)
Troponin I (High Sensitivity): 6 ng/L (ref <18)

## 2022-06-22 LAB — LIPASE, BLOOD: Lipase: 31 U/L (ref 11–51)

## 2022-06-22 LAB — CBG MONITORING, ED: Glucose-Capillary: 111 mg/dL — ABNORMAL HIGH (ref 70–99)

## 2022-06-22 MED ORDER — POTASSIUM CHLORIDE CRYS ER 20 MEQ PO TBCR
40.0000 meq | EXTENDED_RELEASE_TABLET | Freq: Once | ORAL | Status: AC
  Administered 2022-06-22: 40 meq via ORAL
  Filled 2022-06-22: qty 2

## 2022-06-22 MED ORDER — IOHEXOL 300 MG/ML  SOLN
75.0000 mL | Freq: Once | INTRAMUSCULAR | Status: AC | PRN
  Administered 2022-06-22: 75 mL via INTRAVENOUS

## 2022-06-22 MED ORDER — SODIUM CHLORIDE 0.9 % IV BOLUS
500.0000 mL | Freq: Once | INTRAVENOUS | Status: AC
  Administered 2022-06-22: 500 mL via INTRAVENOUS

## 2022-06-22 NOTE — ED Triage Notes (Signed)
Pt via POV c/o dizziness, loss of appetite, nausea, indigestion, chest tightness, and symptoms of IBS x 3 weeks. Seen recently for same.

## 2022-06-22 NOTE — Discharge Instructions (Addendum)
In the emergency department for your dizziness and nausea.  Your workup showed that your potassium level was mildly low and we repleted this for you in the ER.  He otherwise had no signs of infection, severe dehydration, anemia or abnormal heart rhythms.  You do have gallstones but no signs of gallbladder infection.  You can follow-up with the general surgeon as needed if you would like your gallbladder removed.  You should continue to take your fiber supplements, MiraLAX and milk of magnesia as needed for your constipation until you are having regular soft bowel movements daily.  You should follow-up with your primary doctor to have your symptoms rechecked.  You should return to the emergency department if you are having worsening dizziness and you pass out, you have fevers, you have severe abdominal pain or if you have any other new or concerning symptoms.

## 2022-06-22 NOTE — ED Provider Notes (Signed)
Patient signed out to me at 1500 by Dr. Roderic Palau pending CT chest.  In short this is a 71 year old male that presented to the emergency department with dizziness and decreased appetite.  His workup here showed mild hypokalemia that was repleted, EKG within normal range.  Chest x-ray showed possible left lower lobe infiltrate however CT chest showed no evidence of pneumonia.  CT chest incidentally showed cholelithiasis without cholecystitis.  Patient reports he has a known history of gallstones.  He was recommended to follow-up with his primary doctor or general surgery as needed and was given strict return precautions.   Leanord Asal K, DO 06/22/22 1626

## 2022-06-22 NOTE — ED Provider Notes (Signed)
[facility] EMERGENCY DEPARTMENT Provider Note   CSN: [id number] Arrival date & time: 06/22/22  1055     History  Chief Complaint  Patient presents with   Dizziness    Steven [name] is a 71 y.o. male.  Patient complains of some dizziness and loss of appetite.  Patient has a history of vertigo   The history is provided by the patient and medical records. No language interpreter was used.  Dizziness Quality:  Lightheadedness Severity:  Mild Timing:  Intermittent Progression:  Waxing and waning Chronicity:  Recurrent Context: bending over   Relieved by:  Nothing Worsened by:  Nothing Ineffective treatments:  None tried Associated symptoms: no blood in stool, no chest pain, no diarrhea and no headaches        Home Medications Prior to Admission medications   Medication Sig Start Date End Date Taking? Authorizing Provider  alfuzosin (UROXATRAL) 10 MG 24 hr tablet Take 1 tablet (10 mg total) by mouth daily with breakfast. 04/07/22  Yes [name], Candee Furbish, MD  amLODipine (NORVASC) 10 MG tablet Take 1 tablet by mouth daily. 12/08/20  Yes [provider]  carvedilol (COREG) 6.25 MG tablet Take 1 tablet (6.25 mg total) by mouth 2 (two) times daily. 07/04/21  Yes Josue Hector, MD  Cholecalciferol (VITAMIN D3) 125 MCG (5000 UT) CAPS Take 1 capsule by mouth daily.    Yes [provider]  ELIQUIS 5 MG TABS tablet TAKE 1 TABLET(5 MG) BY MOUTH TWICE DAILY 04/24/22  Yes Josue Hector, MD  Erenumab-aooe (AIMOVIG) 70 MG/ML SOAJ Inject 1 mL into the skin every 30 (thirty) days.   Yes [provider]  fluticasone (FLONASE) 50 MCG/ACT nasal spray fluticasone propionate 50 mcg/actuation nasal spray,suspension  SHAKE LIQUID AND USE 1 SPRAY IN EACH NOSTRIL EVERY DAY   Yes [provider]  gabapentin (NEURONTIN) 300 MG capsule Take 300 mg by mouth 3 (three) times daily. 02/24/20  Yes [provider]  hydrochlorothiazide (HYDRODIURIL) 25 MG tablet  Take 25 mg by mouth daily. 07/28/19  Yes [provider]  losartan (COZAAR) 100 MG tablet Take 100 mg by mouth daily. 01/05/21  Yes [provider]  metoCLOPramide (REGLAN) 5 MG tablet Take 1 tablet (5 mg total) by mouth every 8 (eight) hours as needed for nausea (or headache). 06/09/22  Yes Sherwood Gambler, MD  NEXLETOL 180 MG TABS Take 180 mg by mouth daily. Pt taking 2-3 times a week 12/03/19  Yes [provider]  omeprazole (PRILOSEC) 20 MG capsule Take 1 capsule (20 mg total) by mouth 2 (two) times daily before a meal. Patient taking differently: Take 20 mg by mouth 2 (two) times daily before a meal. Taking once daily 10/07/21  Yes Jodi Mourning, Kristen S, PA-C  potassium chloride SA (KLOR-CON) 20 MEQ tablet Take 20 mEq by mouth 2 (two) times daily. 12/09/19  Yes [provider]  Probiotic Product (PROBIOTIC-10 PO) Take 1 capsule by mouth daily.   Yes [provider]  Rimegepant Sulfate (NURTEC) 75 MG TBDP Take 75 mg by mouth as needed. 06/09/22  Yes Genia Harold, MD  clotrimazole-betamethasone (LOTRISONE) cream Apply 1 application topically 2 (two) times daily. Patient not taking: Reported on 06/22/2022 02/17/21   [provider]  diazepam (VALIUM) 5 MG tablet Take 5 mg by mouth daily as needed for anxiety. Patient not taking: Reported on 05/10/2022    [provider]  DULoxetine (CYMBALTA) 20 MG capsule Take 1 capsule (20 mg total) by mouth  daily. Patient not taking: Reported on 05/10/2022 04/24/22   Genia Harold, MD  ondansetron (ZOFRAN ODT) 4 MG disintegrating tablet Take 1 tablet (4 mg total) by mouth every 8 (eight) hours as needed for nausea or vomiting. 06/08/20   Avegno, Darrelyn Hillock, FNP  ondansetron (ZOFRAN) 4 MG tablet Take 1 tablet (4 mg total) by mouth every 8 (eight) hours as needed for nausea or vomiting. 06/07/22   Leath-Warren, Alda Lea, NP  polyethylene glycol (MIRALAX / GLYCOLAX) 17 g packet Miralax    [provider]  tadalafil (CIALIS) 20 MG tablet Take 1 tablet (20 mg total) by mouth daily as needed. Patient not taking: Reported on 05/10/2022 09/20/20   Cleon Gustin, MD  tadalafil (CIALIS) 5 MG tablet Take 1 tablet (5 mg total) by mouth daily. Patient not taking: Reported on 05/10/2022 05/03/22   Cleon Gustin, MD      Allergies    Latex and Meclizine    Review of Systems   Review of Systems  Constitutional:  Negative for appetite change and fatigue.  HENT:  Negative for congestion, ear discharge and sinus pressure.   Eyes:  Negative for discharge.  Respiratory:  Negative for cough.   Cardiovascular:  Negative for chest pain.  Gastrointestinal:  Negative for abdominal pain, blood in stool and diarrhea.  Genitourinary:  Negative for frequency and hematuria.  Musculoskeletal:  Negative for back pain.  Skin:  Negative for rash.  Neurological:  Positive for dizziness. Negative for seizures and headaches.  Psychiatric/Behavioral:  Negative for hallucinations.     Physical Exam Updated Vital Signs BP 137/76   Pulse 63   Temp 98.3 F (36.8 C) (Oral)   Resp 15   Ht '5\' 7"'$  (1.702 m)   Wt 98.9 kg   SpO2 100%   BMI 34.14 kg/m  Physical Exam Vitals and nursing note reviewed.  Constitutional:      Appearance: He is well-developed.  HENT:     Head: Normocephalic.     Nose: Nose normal.  Eyes:     General: No scleral icterus.    Conjunctiva/sclera: Conjunctivae normal.  Neck:     Thyroid: No thyromegaly.  Cardiovascular:     Rate and Rhythm: Normal rate and regular rhythm.     Heart sounds: No murmur heard.    No friction rub. No gallop.  Pulmonary:     Breath sounds: No stridor. No wheezing or rales.  Chest:     Chest wall: No tenderness.  Abdominal:     General: There is no distension.     Tenderness: There is no abdominal tenderness. There is no rebound.  Musculoskeletal:        General: Normal range of motion.     Cervical back: Neck supple.   Lymphadenopathy:     Cervical: No cervical adenopathy.  Skin:    Findings: No erythema or rash.  Neurological:     Mental Status: He is alert and oriented to person, place, and time.     Motor: No abnormal muscle tone.     Coordination: Coordination normal.  Psychiatric:        Behavior: Behavior normal.     ED Results / Procedures / Treatments   Labs (all labs ordered are listed, but only abnormal results are displayed) Labs Reviewed  BASIC METABOLIC PANEL - Abnormal; Notable for the following components:      Result Value   Potassium 3.2 (*)    Glucose, Bld 113 (*)  All other components within normal limits  CBC - Abnormal; Notable for the following components:   WBC 3.4 (*)    All other components within normal limits  URINALYSIS, ROUTINE W REFLEX MICROSCOPIC - Abnormal; Notable for the following components:   Color, Urine STRAW (*)    All other components within normal limits  CBG MONITORING, ED - Abnormal; Notable for the following components:   Glucose-Capillary 111 (*)    All other components within normal limits  HEPATIC FUNCTION PANEL  LIPASE, BLOOD  TROPONIN I (HIGH SENSITIVITY)  TROPONIN I (HIGH SENSITIVITY)    EKG None  Radiology DG ABD ACUTE 2+V W 1V CHEST  Result Date: 06/22/2022 CLINICAL DATA:  Weak EXAM: DG ABDOMEN ACUTE WITH 1 VIEW CHEST COMPARISON:  11/07/19 Abdominal Radiograph FINDINGS: Left basilar pulmonary opacity is favored to represent a combination of pleural effusions and atelectasis, but superimposed infection is difficult to exclude. Normal cardiac and mediastinal contours. No displaced rib fractures. No pneumothorax. Paucity of small bowel gas limits the ability to assess for the small bowel obstruction. Large bowel is normal in caliber. There is mild colonic stool burden. No pneumatosis. No supine evidence of pneumoperitoneum. IMPRESSION: 1. Left basilar pulmonary opacity is favored to represent a combination of pleural effusions and  atelectasis, but superimposed infection is difficult to exclude. 2. Paucity of small bowel gas limits the ability to assess for the small bowel obstruction. Within this limitation no dilated loops of small bowel are visualized to suggest an obstruction. Electronically Signed   By: Marin Roberts M.D.   On: 06/22/2022 13:40    Procedures Procedures    Medications Ordered in ED Medications  sodium chloride 0.9 % bolus 500 mL (0 mLs Intravenous Stopped 06/22/22 1252)  potassium chloride SA (KLOR-CON M) CR tablet 40 mEq (40 mEq Oral Given 06/22/22 1438)    ED Course/ Medical Decision Making/ A&P  This patient presents to the ED for concern of dizziness, this involves an extensive number of treatment options, and is a complaint that carries with it a high risk of complications and morbidity.  The differential diagnosis includes: Dehydration   Co morbidities that complicate the patient evaluation  Vertigo   Additional history obtained:  Additional history obtained from patient External records from outside source obtained and reviewed including hospital records   Lab Tests:  I Ordered, and personally interpreted labs.  The pertinent results include: CBC unremarkable.  Potassium mildly low at 3.2   Imaging Studies ordered:  I ordered imaging studies including chest x-ray I independently visualized and interpreted imaging which showed no acute disease I agree with the radiologist interpretation   Cardiac Monitoring: / EKG:  The patient was maintained on a cardiac monitor.  I personally viewed and interpreted the cardiac monitored which showed an underlying rhythm of: Normal sinus rhythm   Consultations Obtained:  No consultant  Problem List / ED Course / Critical interventions / Medication management  Dizziness and dehydration I ordered medication including saline for dehydration and potassium for hypokalemia Reevaluation of the patient after these medicines showed that the  patient improved I have reviewed the patients home medicines and have made adjustments as needed   Social Determinants of Health:  None   Test / Admission - Considered:  None                          Medical Decision Making Amount and/or Complexity of Data Reviewed Labs: ordered. Radiology: ordered.  Risk Prescription drug management.   Stay hydrated and follow-up with your family doctor to recheck your potassium in the next week.        Final Clinical Impression(s) / ED Diagnoses Final diagnoses:  None    Rx / DC Orders ED Discharge Orders     None         Milton Ferguson, MD 06/24/22 1026

## 2022-06-25 DIAGNOSIS — G43909 Migraine, unspecified, not intractable, without status migrainosus: Secondary | ICD-10-CM | POA: Diagnosis not present

## 2022-06-25 DIAGNOSIS — R42 Dizziness and giddiness: Secondary | ICD-10-CM | POA: Diagnosis not present

## 2022-06-25 DIAGNOSIS — Z7901 Long term (current) use of anticoagulants: Secondary | ICD-10-CM | POA: Diagnosis not present

## 2022-06-25 DIAGNOSIS — D72819 Decreased white blood cell count, unspecified: Secondary | ICD-10-CM | POA: Diagnosis not present

## 2022-06-26 NOTE — Telephone Encounter (Signed)
Noted  Spoke with the pt and advised of the recommendations and the pt states its got to be something making him sick. He has a smart phone but couldn't remember his MyChart information. I advised the pt to call and find out about it or this visit has to be in person. Pt states he will call back tomorrow he's got to see if someone can bring him.

## 2022-06-26 NOTE — Telephone Encounter (Signed)
I reviewed labs from yesterday. I do not see anything from a GI standpoint that would contribute to dizziness. Hgb 13.7 yesterday.  I do not have anything additional to offer until I see him on Wednesday. If he wants this to be virtual, please make sure he ask access to this via MyChart and has a smart phone. If not, it really needs to be in person.

## 2022-06-26 NOTE — Telephone Encounter (Signed)
Pt called again fussing about how he has been calling since last week. He's still nauseated (hasn't taken nausea meds since Saturday), back an forth diarrhea to constipation (but was told to stop Miralax and he is still taking), no appetite and dizziness. Went to ED the weekend here and in [facility]. Pt states he is so dizzy that he has to have someone with him when having a BM. I asked him about why he doesn't see the GI at [facility] anymore he stated they turned him loose. I advised him that I had seen all the notes and that he is on your schedule for this Wednesday. The pt wants it to be virtual because of his dizziness. I advised him we couldn't help that but if he is not following instructions that's not helping him or [place]. Please advise because he's expecting something magical today and just was seen in ED yesterday.

## 2022-06-27 ENCOUNTER — Telehealth: Payer: Self-pay | Admitting: *Deleted

## 2022-06-27 DIAGNOSIS — L03116 Cellulitis of left lower limb: Secondary | ICD-10-CM | POA: Diagnosis not present

## 2022-06-27 DIAGNOSIS — I10 Essential (primary) hypertension: Secondary | ICD-10-CM | POA: Diagnosis not present

## 2022-06-27 DIAGNOSIS — M4722 Other spondylosis with radiculopathy, cervical region: Secondary | ICD-10-CM | POA: Diagnosis not present

## 2022-06-27 DIAGNOSIS — I4891 Unspecified atrial fibrillation: Secondary | ICD-10-CM | POA: Diagnosis not present

## 2022-06-27 NOTE — Telephone Encounter (Signed)
noted 

## 2022-06-27 NOTE — Telephone Encounter (Signed)
Mandy, please make tomorrow's visit a virtual encounter. Thanks!

## 2022-06-27 NOTE — Telephone Encounter (Signed)
Pt returned call and he wants the virtual visit. Pt states he has the right type of phone.

## 2022-06-28 ENCOUNTER — Emergency Department (HOSPITAL_COMMUNITY): Payer: Medicare Other

## 2022-06-28 ENCOUNTER — Telehealth: Payer: Self-pay | Admitting: Cardiovascular Disease

## 2022-06-28 ENCOUNTER — Emergency Department (HOSPITAL_COMMUNITY)
Admission: EM | Admit: 2022-06-28 | Discharge: 2022-06-28 | Disposition: A | Discharge status: Home / Self Care | Payer: Medicare Other | Attending: Emergency Medicine | Admitting: Emergency Medicine

## 2022-06-28 ENCOUNTER — Other Ambulatory Visit: Payer: Self-pay

## 2022-06-28 ENCOUNTER — Encounter: Payer: Self-pay | Admitting: Gastroenterology

## 2022-06-28 ENCOUNTER — Telehealth (INDEPENDENT_AMBULATORY_CARE_PROVIDER_SITE_OTHER): Payer: Medicare Other | Admitting: Gastroenterology

## 2022-06-28 ENCOUNTER — Encounter (HOSPITAL_COMMUNITY): Payer: Self-pay

## 2022-06-28 VITALS — BP 126/80 | HR 73 | Ht 67.0 in | Wt 217.0 lb

## 2022-06-28 DIAGNOSIS — Z79899 Other long term (current) drug therapy: Secondary | ICD-10-CM | POA: Insufficient documentation

## 2022-06-28 DIAGNOSIS — Z91040 Latex allergy status: Secondary | ICD-10-CM | POA: Insufficient documentation

## 2022-06-28 DIAGNOSIS — J9811 Atelectasis: Secondary | ICD-10-CM | POA: Diagnosis not present

## 2022-06-28 DIAGNOSIS — R0789 Other chest pain: Secondary | ICD-10-CM | POA: Diagnosis not present

## 2022-06-28 DIAGNOSIS — R42 Dizziness and giddiness: Secondary | ICD-10-CM | POA: Insufficient documentation

## 2022-06-28 DIAGNOSIS — K5900 Constipation, unspecified: Secondary | ICD-10-CM | POA: Diagnosis not present

## 2022-06-28 DIAGNOSIS — R079 Chest pain, unspecified: Secondary | ICD-10-CM

## 2022-06-28 DIAGNOSIS — I10 Essential (primary) hypertension: Secondary | ICD-10-CM | POA: Diagnosis not present

## 2022-06-28 DIAGNOSIS — I4891 Unspecified atrial fibrillation: Secondary | ICD-10-CM | POA: Diagnosis not present

## 2022-06-28 LAB — CBC
HCT: 40.6 % (ref 39.0–52.0)
Hemoglobin: 13.5 g/dL (ref 13.0–17.0)
MCH: 29.2 pg (ref 26.0–34.0)
MCHC: 33.3 g/dL (ref 30.0–36.0)
MCV: 87.9 fL (ref 80.0–100.0)
Platelets: 249 10*3/uL (ref 150–400)
RBC: 4.62 MIL/uL (ref 4.22–5.81)
RDW: 12.3 % (ref 11.5–15.5)
WBC: 4.2 10*3/uL (ref 4.0–10.5)
nRBC: 0 % (ref 0.0–0.2)

## 2022-06-28 LAB — HEPATIC FUNCTION PANEL
ALT: 20 U/L (ref 0–44)
AST: 20 U/L (ref 15–41)
Albumin: 4.1 g/dL (ref 3.5–5.0)
Alkaline Phosphatase: 44 U/L (ref 38–126)
Bilirubin, Direct: 0.2 mg/dL (ref 0.0–0.2)
Indirect Bilirubin: 0.6 mg/dL (ref 0.3–0.9)
Total Bilirubin: 0.8 mg/dL (ref 0.3–1.2)
Total Protein: 7.3 g/dL (ref 6.5–8.1)

## 2022-06-28 LAB — BASIC METABOLIC PANEL
Anion gap: 9 (ref 5–15)
BUN: 8 mg/dL (ref 8–23)
CO2: 27 mmol/L (ref 22–32)
Calcium: 9.6 mg/dL (ref 8.9–10.3)
Chloride: 101 mmol/L (ref 98–111)
Creatinine, Ser: 0.95 mg/dL (ref 0.61–1.24)
GFR, Estimated: >60 mL/min (ref >60)
Glucose, Bld: 109 mg/dL — ABNORMAL HIGH (ref 70–99)
Potassium: 3.3 mmol/L — ABNORMAL LOW (ref 3.5–5.1)
Sodium: 137 mmol/L (ref 135–145)

## 2022-06-28 LAB — TROPONIN I (HIGH SENSITIVITY)
Troponin I (High Sensitivity): 8 ng/L (ref <18)
Troponin I (High Sensitivity): 7 ng/L (ref <18)

## 2022-06-28 LAB — LIPASE, BLOOD: Lipase: 33 U/L (ref 11–51)

## 2022-06-28 MED ORDER — DIAZEPAM 2 MG PO TABS
2.0000 mg | ORAL_TABLET | Freq: Once | ORAL | Status: AC
  Administered 2022-06-28: 2 mg via ORAL
  Filled 2022-06-28: qty 1

## 2022-06-28 MED ORDER — DIAZEPAM 5 MG PO TABS: 5.0000 mg | ORAL_TABLET | Freq: Two times a day (BID) | ORAL | 0 refills | Status: DC

## 2022-06-28 MED ORDER — DEXAMETHASONE SODIUM PHOSPHATE 10 MG/ML IJ SOLN
10.0000 mg | Freq: Once | INTRAMUSCULAR | Status: AC
  Administered 2022-06-28: 10 mg via INTRAVENOUS
  Filled 2022-06-28: qty 1

## 2022-06-28 MED ORDER — PREDNISONE 10 MG PO TABS: ORAL_TABLET | ORAL | 0 refills | Status: DC

## 2022-06-28 NOTE — Telephone Encounter (Signed)
Patient called stating he is still having nausea, and no appetite.  He states he is feeling weak, has no energy, is feeling dizzy.

## 2022-06-28 NOTE — Discharge Instructions (Signed)
Your tests and exam today are reassuring, you do not need to be admitted to the hospital at this time.  However, I do recommend you follow up with your your neurologist since your dizziness is worsening.  She does have other treatments in mind for you if your symptoms are not improving with other things that have been tried. In the interim,  try the medicines prescribed to see if these will improve your symptoms.

## 2022-06-28 NOTE — ED Provider Notes (Signed)
[facility] EMERGENCY DEPARTMENT Provider Note   CSN: [id number] Arrival date & time: 06/28/22  1201     History  Chief Complaint  Patient presents with   Chest Pain    Steven [name] is a 71 y.o. male with a history including hypertension, hyperlipidemia, atrial fibrillation on Eliquis, GERD, chronic dizziness of unclear etiology, possibly migraine related under the care of neurology, also has a history of chronic tinnitus presenting for evaluation of left-sided chest pain with radiation down his left arm which started this morning.  He reports he was lying down midmorning trying to take a nap when pain in his left shoulder woke him from sleep.  He is currently not having the symptoms, symptoms lasted for approximately 10 minutes.  He has had no treatment prior to arrival.  It was associated with nausea without emesis, he also reports dizziness, he has been seen for this condition in the past including by his neurologist and have found no source or treatment for the symptoms which have been intermittently present since 2020. Marland Kitchen  He has had increasing episodes of chest flutters, most recently yesterday, but denies these symptoms today.  He is followed by Dr. Johnsie Cancel of cardiology but wishes to be seen by a closer cardiologist, not wanting to drive to [place].  His last cardiology visit was in August, at which time all symptoms were stable, no changes were made in medications.  His most recent cardiac testing was a [employer] in 2020 and was a negative study.  Although he has been having episodic dizziness described as unsteadiness with positional changes since 2020, intermittently, seen by neurology and felt these episodes are possibly related to migraine headache, he endorses increased dizziness over the past 2 weeks.  He was seen at [facility] 3 days ago for the same complaint, his work-up was negative, so was discharged home.  He states he was referred to an audiologist as with his chronic  tinnitus they suspect a middle ear problem causing his dizziness symptoms.  He does not feel that he can function or drive safely with his current symptoms, relying on family for this.  Of note recent imaging revealed gallstones and is scheduled to see general surgery tomorrow to discuss possible surgical intervention.  He denies abdominal pain at this time.  His nausea is also improved.    The history is provided by the patient.       Home Medications Prior to Admission medications   Medication Sig Start Date End Date Taking? Authorizing Provider  alfuzosin (UROXATRAL) 10 MG 24 hr tablet Take 1 tablet (10 mg total) by mouth daily with breakfast. 04/07/22  Yes [name], Candee Furbish, MD  amLODipine (NORVASC) 10 MG tablet Take 1 tablet by mouth daily. 12/08/20  Yes [provider]  carvedilol (COREG) 6.25 MG tablet Take 1 tablet (6.25 mg total) by mouth 2 (two) times daily. 07/04/21  Yes Josue Hector, MD  Cholecalciferol (VITAMIN D3) 125 MCG (5000 UT) CAPS Take 1 capsule by mouth daily.    Yes [provider]  diazepam (VALIUM) 5 MG tablet Take 1 tablet (5 mg total) by mouth 2 (two) times daily. 06/28/22  Yes Aryka Coonradt, Almyra Free, PA-C  ELIQUIS 5 MG TABS tablet TAKE 1 TABLET(5 MG) BY MOUTH TWICE DAILY Patient taking differently: Take 5 mg by mouth 2 (two) times daily. 04/24/22  Yes Josue Hector, MD  Erenumab-aooe (AIMOVIG) 70 MG/ML SOAJ Inject 1 mL into the skin every 30 (thirty) days.  Yes [provider]  fluticasone (FLONASE) 50 MCG/ACT nasal spray Place 1 spray into both nostrils daily.   Yes [provider]  gabapentin (NEURONTIN) 300 MG capsule Take 300 mg by mouth 3 (three) times daily. 02/24/20  Yes [provider]  hydrochlorothiazide (HYDRODIURIL) 25 MG tablet Take 25 mg by mouth daily. 07/28/19  Yes [provider]  losartan (COZAAR) 100 MG tablet Take 100 mg by mouth daily. 01/05/21  Yes [provider]  NEXLETOL 180 MG TABS Take  180 mg by mouth daily. Pt taking 2-3 times a week 12/03/19  Yes [provider]  omeprazole (PRILOSEC) 20 MG capsule Take 1 capsule (20 mg total) by mouth 2 (two) times daily before a meal. Patient taking differently: Take 20 mg by mouth 2 (two) times daily before a meal. Taking once daily 10/07/21  Yes Jodi Mourning, Kristen S, PA-C  polyethylene glycol (MIRALAX / GLYCOLAX) 17 g packet Take by mouth daily.   Yes [provider]  potassium chloride SA (KLOR-CON) 20 MEQ tablet Take 20 mEq by mouth See admin instructions. 20 meq twice a day and 3 times a day when needed for potassium. 12/09/19  Yes [provider]  predniSONE (DELTASONE) 10 MG tablet 6, 5, 4, 3, 2 then 1 tablet by mouth daily for 6 days total. Patient not taking: Reported on 06/29/2022 06/28/22  Yes Melisha Eggleton, Almyra Free, PA-C  Probiotic Product (PROBIOTIC-10 PO) Take 1 capsule by mouth daily.   Yes [provider]  promethazine (PHENERGAN) 12.5 MG tablet Take 12.5 mg by mouth every 6 (six) hours as needed for nausea or vomiting.   Yes [provider]      Allergies    Latex and Meclizine    Review of Systems   Review of Systems  Constitutional:  Negative for chills and fever.  HENT:  Negative for congestion and sore throat.   Eyes: Negative.   Respiratory:  Negative for chest tightness and shortness of breath.   Cardiovascular:  Positive for chest pain and palpitations. Negative for leg swelling.  Gastrointestinal:  Positive for nausea. Negative for abdominal pain and vomiting.  Genitourinary: Negative.   Musculoskeletal:  Positive for arthralgias. Negative for joint swelling and neck pain.  Skin: Negative.  Negative for rash and wound.  Neurological:  Positive for dizziness. Negative for weakness, light-headedness, numbness and headaches.  Psychiatric/Behavioral: Negative.      Physical Exam Updated Vital Signs BP (!) 143/78   Pulse 62   Temp 98.5 F (36.9 C)   Resp 14   Ht '5\' 7"'$  (1.702 m)    Wt 98 kg   SpO2 99%   BMI 33.83 kg/m  Physical Exam Vitals and nursing note reviewed.  Constitutional:      Appearance: He is well-developed.  HENT:     Head: Normocephalic and atraumatic.  Eyes:     Extraocular Movements: Extraocular movements intact.     Conjunctiva/sclera: Conjunctivae normal.     Comments: Non-ST segments with EOM movements, however he does endorse increased dizziness with this maneuver.  Cardiovascular:     Rate and Rhythm: Normal rate and regular rhythm.     Heart sounds: Normal heart sounds. No murmur heard.    Comments: Patient is not in A-fib or flutter. Pulmonary:     Effort: Pulmonary effort is normal.     Breath sounds: Normal breath sounds. No wheezing, rhonchi or rales.  Abdominal:     General: Bowel sounds are normal.     Palpations:  Abdomen is soft.     Tenderness: There is no abdominal tenderness.  Musculoskeletal:        General: Normal range of motion.     Cervical back: Normal range of motion.     Right lower leg: No edema.     Left lower leg: No edema.  Skin:    General: Skin is warm and dry.  Neurological:     General: No focal deficit present.     Mental Status: He is alert.     Cranial Nerves: No cranial nerve deficit.     Motor: No weakness, tremor or pronator drift.     Gait: Gait normal.     Comments: Equal grip strength.  Psychiatric:        Mood and Affect: Mood normal.     ED Results / Procedures / Treatments   Labs (all labs ordered are listed, but only abnormal results are displayed) Labs Reviewed  BASIC METABOLIC PANEL - Abnormal; Notable for the following components:      Result Value   Potassium 3.3 (*)    Glucose, Bld 109 (*)    All other components within normal limits  CBC  HEPATIC FUNCTION PANEL  LIPASE, BLOOD  TROPONIN I (HIGH SENSITIVITY)  TROPONIN I (HIGH SENSITIVITY)    EKG EKG Interpretation  Date/Time:  Wednesday June 28 2022 12:20:06 EST Ventricular Rate:  70 PR Interval:  162 QRS  Duration: 82 QT Interval:  386 QTC Calculation: 416 R Axis:   -1 Text Interpretation: Normal sinus rhythm Confirmed by Godfrey Pick [phone]) on 06/28/2022 2:24:46 PM  Radiology DG Chest 2 View  Result Date: 06/28/2022 CLINICAL DATA:  Chest pain EXAM: CHEST - 2 VIEW COMPARISON:  X-ray dated June 22, 2022 FINDINGS: Cardiac and mediastinal contours are within normal limits. Bilateral diaphragm eventration and left basilar atelectasis. Lungs are otherwise clear. No evidence of pleural effusion or pneumothorax IMPRESSION: Bilateral diaphragm eventration and left basilar atelectasis, similar to prior exam. Electronically Signed   By: Yetta Glassman M.D.   On: 06/28/2022 12:50    Procedures Procedures    Medications Ordered in ED Medications  diazepam (VALIUM) tablet 2 mg (2 mg Oral Given 06/28/22 1737)  dexamethasone (DECADRON) injection 10 mg (10 mg Intravenous Given 06/28/22 1842)    ED Course/ Medical Decision Making/ A&P                           Medical Decision Making Pt with complaint of chest pain with shoulder radiation, 10 minutes, woke from this around 10 am today. Also with c/o worsening dizziness, sx present for several years, felt to be associated with his chronic tinnitus, or associated with migraine headaches, chronic.  Under the care of neuro,  also has seen Dr. Benjamine Mola in the past of ENT.  H/o afib/flutter, controlled today.  Last myoview 2020, negative.  Delta trops negative today, sx not likely unstable angina/ACS.  No sob, Vss, doubt PE, cxr neg for pneumonia.  F/u cardiology.   Neuro exam normal, dizziness is chronic, no suggestion of acute cva, not vertiginous in character, although has tried meclizine in the past was not helpful but caused gi upset.  Will prescribed low dose valium for dizziness. Prednisone for inflammation.  Advised f/u with pcp or ENT, he is also awaiting audiology eval as well.   Amount and/or Complexity of Data Reviewed Labs: ordered.    Details:  Normal cbc, bmet stable, delta trops neg. Radiology:  ordered.  Risk Prescription drug management.           Final Clinical Impression(s) / ED Diagnoses Final diagnoses:  Nonspecific chest pain  Dizziness    Rx / DC Orders ED Discharge Orders          Ordered    predniSONE (DELTASONE) 10 MG tablet        06/28/22 1835    diazepam (VALIUM) 5 MG tablet  2 times daily        06/28/22 1835              Evalee Jefferson, Hershal Coria 06/29/22 2320    Godfrey Pick, MD 07/01/22 1116

## 2022-06-28 NOTE — Progress Notes (Signed)
Primary Care Physician:  Redmond School, MD  Primary GI: Dr. Gala Romney   Patient Location: Home   Provider Location: [facility] office   Reason for Visit: Dizziness, nausea, constipation   Persons present on the virtual encounter, with roles: Patient and NP   Total time (minutes) spent on medical discussion: 15 minutes   Due to COVID-19, visit was conducted using virtual method.  Visit was requested by patient.  Virtual Visit via Telephone; technical difficulties with video Due to COVID-19, visit is conducted virtually and was requested by patient.   I connected with Steven [name] on 06/28/22 at 10:30 AM EST by telephone and verified that I am speaking with the correct person using two identifiers.   I discussed the limitations, risks, security and privacy concerns of performing an evaluation and management service by telephone and the availability of in person appointments. I also discussed with the patient that there may be a patient responsible charge related to this service. The patient expressed understanding and agreed to proceed.  Chief Complaint  Patient presents with   Nausea    Patient here today due to issues with nausea and feels like he will pass out when having a bm. He says at times he feels this way when he is about to urinate.He says it does occur mostly when he stands up. Has never had issues per patient with bp dropping. He also went [facility] Saturday due to the nausea and feeling of passing out. He says they ran several test on him. He's says they did not do orthostatic bp on him.He has a decreased appetite.     History of Present Illness:  71 y.o. male with history of rectal pain in the setting of chronic anal fissure previously failed topical treatment and underwent Botox therapy with [place] surgery, ultimately referred to [facility] with recommendations to treat supportively with focus on magement of chronic constipation/IBS, previously with  weight loss and diagnosed with subacute thyroiditis treated with a course of methimazole and weight loss resolved, previously with morning nausea resolved with omeprazole daily, constipation  Today was unable to present in person and had issues on MyChart for Video visit.   Normally BM first thing in the morning. Stool is not hard but has to strain to have a BM. Having gas. Has been nauseated. One morning felt like he was going to pass out on toilet. Feels like he was straining. Stood up to wipe and felt dizzy.  Constipation: miralax capful a day. Will be constipated if he doesn't take this.   Tried metamucil in the past but not on currently. Will have a BM in the morning then 30 minutes later will have another one.   Feels dizzy all the time. Chronic migraines. Dizzy since 2020.   Prior treatment of constipation has included colace, Trulance which caused diarrhea, and various doses of MiraLAX, and fiber.   Past Medical History:  Diagnosis Date   Anxiety    Arthritis    Atrial fibrillation (HCC)    Atrial flutter (HCC)    BPH (benign prostatic hyperplasia)    Chest pain    Dizziness    Dyspnea    laying down occ   Fracture 08/17/2015   MULTIPLE RIB FRACTURES     FROM FALL    GERD (gastroesophageal reflux disease)    Hemorrhoids    History of kidney stones    noted on CT scan   History of radiation therapy 08/22/11-10/13/11  prostate   Hyperlipemia    Hypertension    Hyperthyroidism    IBS (irritable bowel syndrome)    Insomnia    Light headedness    Migraine    Numbness and tingling in left arm    Numbness and tingling of both legs    Open fracture of left elbow 08/18/2015   Prostate cancer [facility])    prostate s/p radiation Mar 2013   Rib fractures 08/17/2015   Tinnitus      Past Surgical History:  Procedure Laterality Date   BOTOX INJECTION N/A 01/14/2020   Procedure: INJECTION OF BOTOX INTO ANAL SPHINCTER;  Surgeon: Ileana Roup, MD;  Location: [facility] ORS;   Service: General;  Laterality: N/A;   COLONOSCOPY N/A 12/19/2012   HUD:JSHFWY 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 Normal appearing colon   COLONOSCOPY WITH PROPOFOL N/A 10/23/2019   Procedure: COLONOSCOPY WITH PROPOFOL;  Surgeon: Daneil Dolin, MD; external and grade 2 internal hemorrhoids, abnormal rectal blood vessels consistent with radiation proctitis s/p APC therapy, otherwise normal exam.   EVALUATION UNDER ANESTHESIA WITH ANAL FISTULECTOMY N/A 01/14/2020   Procedure: ANORECTAL EXAM UNDER ANESTHESIA;  Surgeon: Ileana Roup, MD;  Location: [facility] ORS;  Service: General;  Laterality: N/A;   HOT HEMOSTASIS  10/23/2019   Procedure: HOT HEMOSTASIS (ARGON PLASMA COAGULATION/BICAP);  Surgeon: Daneil Dolin, MD;  Location: [facility] ENDO SUITE;  Service: Endoscopy;;  apc rectal proctitis     KIDNEY SURGERY  1982   kidney tube collapse repair   RADIOACTIVE SEED IMPLANT     Prostate     Current Meds  Medication Sig   alfuzosin (UROXATRAL) 10 MG 24 hr tablet Take 1 tablet (10 mg total) by mouth daily with breakfast.   amLODipine (NORVASC) 10 MG tablet Take 1 tablet by mouth daily.   carvedilol (COREG) 6.25 MG tablet Take 1 tablet (6.25 mg total) by mouth 2 (two) times daily.   Cholecalciferol (VITAMIN D3) 125 MCG (5000 UT) CAPS Take 1 capsule by mouth daily.    clotrimazole-betamethasone (LOTRISONE) cream Apply 1 application  topically 2 (two) times daily.   diazepam (VALIUM) 5 MG tablet Take 5 mg by mouth daily as needed for anxiety.   ELIQUIS 5 MG TABS tablet TAKE 1 TABLET(5 MG) BY MOUTH TWICE DAILY   Erenumab-aooe (AIMOVIG) 70 MG/ML SOAJ Inject 1 mL into the skin every 30 (thirty) days.   fluticasone (FLONASE) 50 MCG/ACT nasal spray fluticasone propionate 50 mcg/actuation nasal spray,suspension  SHAKE LIQUID AND USE 1 SPRAY IN EACH NOSTRIL EVERY DAY   gabapentin (NEURONTIN) 300 MG capsule Take 300 mg by mouth 3 (three) times  daily.   hydrochlorothiazide (HYDRODIURIL) 25 MG tablet Take 25 mg by mouth daily.   losartan (COZAAR) 100 MG tablet Take 100 mg by mouth daily.   NEXLETOL 180 MG TABS Take 180 mg by mouth daily. Pt taking 2-3 times a week   omeprazole (PRILOSEC) 20 MG capsule Take 1 capsule (20 mg total) by mouth 2 (two) times daily before a meal. (Patient taking differently: Take 20 mg by mouth 2 (two) times daily before a meal. Taking once daily)   polyethylene glycol (MIRALAX / GLYCOLAX) 17 g packet Take by mouth daily.   potassium chloride SA (KLOR-CON) 20 MEQ tablet Take 20 mEq by mouth 2 (two) times daily.   Probiotic Product (PROBIOTIC-10 PO) Take 1 capsule by mouth daily.   promethazine (PHENERGAN) 12.5 MG tablet Take 12.5 mg by  mouth every 6 (six) hours as needed for nausea or vomiting.     Family History  Problem Relation Age of Onset   Aneurysm Mother        aortic   Hypertension Mother    Headache Mother    Prostate cancer Father    Hypertension Father    Colon cancer Neg Hx    Colon polyps Neg Hx     Social History   Socioeconomic History   Marital status: Single    Spouse name: Not on file   Number of children: 2   Years of education: Not on file   Highest education level: 12th grade  Occupational History   Occupation: Custodian     Employer: [employer]  Tobacco Use   Smoking status: Former    Packs/day: 1.00    Years: 20.00    Total pack years: 20.00    Types: Cigarettes    Quit date: 2020    Years since quitting: 3.9   Smokeless tobacco: Never   Tobacco comments:    Quit smoking x 2 years    07/2015   SOMETIIMES i USE VAPOR   Vaping Use   Vaping Use: Never used  Substance and Sexual Activity   Alcohol use: Never   Drug use: Never   Sexual activity: Not Currently  Other Topics Concern   Not on file  Social History Narrative   Lives with friend   Divorced   No caffeine   Social Determinants of Health   Financial Resource Strain: Low Risk   (11/03/2020)   Overall Financial Resource Strain (CARDIA)    Difficulty of Paying Living Expenses: Not hard at all  Food Insecurity: No Food Insecurity (11/03/2020)   Hunger Vital Sign    Worried About Running Out of Food in the Last Year: Never true    [place] in the Last Year: Never true  Transportation Needs: No Transportation Needs (11/03/2020)   PRAPARE - Hydrologist (Medical): No    Lack of Transportation (Non-Medical): No  Physical Activity: Inactive (11/03/2020)   Exercise Vital Sign    Days of Exercise per Week: 0 days    Minutes of Exercise per Session: 0 min  Stress: No Stress Concern Present (11/03/2020)   [place]    Feeling of Stress : Not at all  Social Connections: Socially Isolated (11/03/2020)   Social Connection and Isolation Panel [NHANES]    Frequency of Communication with Friends and Family: Twice a week    Frequency of Social Gatherings with Friends and Family: Once a week    Attends Religious Services: Never    Marine scientist or Organizations: No    Attends Archivist Meetings: Never    Marital Status: Divorced       Review of Systems: See HPI  Observations/Objective: No distress. Unable to perform physical exam due to telephone encounter.   Assessment and Plan: 71 y.o. male with history of rectal pain in the setting of chronic anal fissure previously failed topical treatment and underwent Botox therapy with [place] surgery, ultimately referred to [facility] with recommendations to treat supportively with focus on magement of chronic constipation/IBS, previously with weight loss and diagnosed with subacute thyroiditis treated with a course of methimazole and weight loss resolved, previously with morning nausea resolved with omeprazole daily, constipation   Constipation: add Benefiber. Continue Miralax. Dizziness while  having BM likely vasovagal response.  Nausea: unclear etiology. Constipation could be contributing. I am more concerned of non-GI issues, as he notes persistent dizziness. He is to follow-up with Cardiology and Neurology. If despite bowel regimen and addressing dizzinesss he still has nausea, will pursue further evaluation.     Follow Up Instructions: Add Benefiber Continue Miralax Follow-up with Cardiology Follow-up withNeurology  Progress report in 1-2 weeks Return in 6 weeks    I discussed the assessment and treatment plan with the patient. The patient was provided an opportunity to ask questions and all were answered. The patient agreed with the plan and demonstrated an understanding of the instructions.   The patient was advised to call back or seek an in-person evaluation if the symptoms worsen or if the condition fails to improve as anticipated.  I provided 15 minutes of telephone time today.   Annitta Needs, PhD, ANP-BC [facility]

## 2022-06-28 NOTE — Telephone Encounter (Signed)
Patient stated symptoms have persisted for 1 week. Pt has VV w/ GI today. Will follow up after appt.

## 2022-06-28 NOTE — Patient Instructions (Signed)
I would like for you to start taking 2 teaspoons of Benefiber up to three times a day.  Continue Miralax daily as needed.  Please follow-up with Cardiology.  We will see you in 6 weeks!  Annitta Needs, PhD, ANP-BC [facility]

## 2022-06-28 NOTE — ED Triage Notes (Signed)
Pt presents to ED with complaints of left sided chest pain radiating down left arm started today with nausea and dizzines

## 2022-06-29 ENCOUNTER — Ambulatory Visit (INDEPENDENT_AMBULATORY_CARE_PROVIDER_SITE_OTHER): Payer: Medicare Other | Admitting: General Surgery

## 2022-06-29 ENCOUNTER — Encounter: Payer: Self-pay | Admitting: General Surgery

## 2022-06-29 VITALS — BP 117/74 | HR 76 | Temp 98.6°F | Resp 16 | Ht 67.0 in | Wt 206.0 lb

## 2022-06-29 DIAGNOSIS — R55 Syncope and collapse: Secondary | ICD-10-CM | POA: Diagnosis not present

## 2022-06-29 DIAGNOSIS — K8020 Calculus of gallbladder without cholecystitis without obstruction: Secondary | ICD-10-CM

## 2022-06-29 NOTE — Patient Instructions (Addendum)
Try to get in with Neurology regarding your dizziness and possible inner ear issue.  I will touch base with GI about a potential upper endoscopy.  Pay attention to your diet and if food is making you have any right sided pain or your nausea worse, specifically greasy fatty food.   Cholelithiasis Cholelithiasis is a disease in which gallstones form in the gallbladder. The gallbladder is an organ that stores bile. Bile is a fluid that helps to digest fats. Gallstones begin as small crystals and can slowly grow into stones. They may cause no symptoms until they block the gallbladder duct, or cystic duct, when the gallbladder tightens (contracts) after food is eaten. This can cause pain and is known as a gallbladder attack, or biliary colic. There are two main types of gallstones: Cholesterol stones. These are the most common type of gallstone. These stones are made of hardened cholesterol and are usually yellow-green in color. Cholesterol is a fat-like substance that is made in the liver. Pigment stones. These are dark in color and are made of a red-yellow substance, called bilirubin,that forms when hemoglobin from red blood cells breaks down. What are the causes? This condition may be caused by an imbalance in the different parts that make bile. This can happen if the bile: Has too much bilirubin. This can happen in certain blood diseases, such as sickle cell anemia. Has too much cholesterol. Does not have enough bile salts. These salts help the body absorb and digest fats. In some cases, this condition can also be caused by the gallbladder not emptying completely or often enough. This is common during pregnancy. What increases the risk? The following factors may make you more likely to develop this condition: Being male. Having multiple pregnancies. Health care providers sometimes advise removing diseased gallbladders before future pregnancies. Eating a diet that is heavy in fried foods, fat,  and refined carbohydrates, such as white bread and white rice. Being obese. Being older than age 25. Using medicines that contain male hormones (estrogen) for a long time. Losing weight quickly. Having a family history of gallstones. Having certain medical problems, such as: Diabetes mellitus. Cystic fibrosis. Crohn's disease. Cirrhosis or other long-term (chronic) liver disease. Certain blood diseases, such as sickle cell anemia or leukemia. What are the signs or symptoms? In many cases, having gallstones causes no symptoms. When you have gallstones but do not have symptoms, you have silent gallstones. If a gallstone blocks your bile duct, it can cause a gallbladder attack. The main symptom of a gallbladder attack is sudden pain in the upper right part of the abdomen. The pain: Usually comes at night or after eating. Can last for one hour or more. Can spread to your right shoulder, back, or chest. Can feel like indigestion. This is discomfort, burning, or fullness in your upper abdomen. If the bile duct is blocked for more than a few hours, it can cause an infection or inflammation of your gallbladder (cholecystitis), liver, or pancreas. This can cause: Nausea or vomiting. Bloating. Pain in your abdomen that lasts for 5 hours or longer. Tenderness in your upper abdomen, often in the upper right section and under your rib cage. Fever or chills. Skin or the white parts of your eyes turning yellow (jaundice). This usually happens when a stone has blocked bile from passing through the common bile duct. Dark urine or light-colored stools. How is this diagnosed? This condition may be diagnosed based on: A physical exam. Your medical history. Ultrasound. CT scan.  MRI. You may also have other tests, including: Blood tests to check for signs of an infection or inflammation. Cholescintigraphy, or HIDA scan. This is a scan of your gallbladder and bile ducts (biliary system) using  non-harmful radioactive material and special cameras that can see the radioactive material. Endoscopic retrograde cholangiopancreatogram. This involves inserting a small tube with a camera on the end (endoscope) through your mouth to look at bile ducts and check for blockages. How is this treated? Treatment for this condition depends on the severity of the condition. Silent gallstones do not need treatment. Treatment may be needed if a blockage causes a gallbladder attack or other symptoms. Treatment may include: Home care, if symptoms are not severe. During a simple gallbladder attack, stop eating and drinking for 12-24 hours (except for water and clear liquids). This helps to "cool down" your gallbladder. After 1 or 2 days, you can start to eat a diet of simple or clear foods, such as broths and crackers. You may also need medicines for pain or nausea or both. If you have cholecystitis and an infection, you will need antibiotics. A hospital stay, if needed for pain control or for cholecystitis with severe infection. Cholecystectomy, or surgery to remove your gallbladder. This is the most common treatment if all other treatments have not worked. Medicines to break up gallstones. These are most effective at treating small gallstones. Medicines may be used for up to 6-12 months. Endoscopic retrograde cholangiopancreatogram. A small basket can be attached to the endoscope and used to capture and remove gallstones, mainly those that are in the common bile duct. Follow these instructions at home: Medicines Take over-the-counter and prescription medicines only as told by your health care provider. If you were prescribed an antibiotic medicine, take it as told by your health care provider. Do not stop taking the antibiotic even if you start to feel better. Ask your health care provider if the medicine prescribed to you requires you to avoid driving or using machinery. Eating and drinking Drink enough  fluid to keep your urine pale yellow. This is important during a gallbladder attack. Water and clear liquids are preferred. Follow a healthy diet. This includes: Reducing fatty foods, such as fried food and foods high in cholesterol. Reducing refined carbohydrates, such as white bread and white rice. Eating more fiber. Aim for foods such as almonds, fruit, and beans. Alcohol use If you drink alcohol: Limit how much you use to: 0-1 drink a day for nonpregnant women. 0-2 drinks a day for men. Be aware of how much alcohol is in your drink. In the [place] one drink equals one 12 oz bottle of beer (355 mL), one 5 oz glass of wine (148 mL), or one 1 oz glass of hard liquor (44 mL). General instructions Do not use any products that contain nicotine or tobacco, such as cigarettes, e-cigarettes, and chewing tobacco. If you need help quitting, ask your health care provider. Maintain a healthy weight. Keep all follow-up visits as told by your health care provider. These may include consultations with a surgeon or specialist. This is important. Where to find more information [employer] of Diabetes and Digestive and Kidney Diseases: [email or web address] Contact a health care provider if: You think you have had a gallbladder attack. You have been diagnosed with silent gallstones and you develop pain in your abdomen or indigestion. You begin to have attacks more often. You have dark urine or light-colored stools. Get help right away if: You have pain from a  gallbladder attack that lasts for more than 2 hours. You have pain in your abdomen that lasts for more than 5 hours or is getting worse. You have a fever or chills. You have nausea and vomiting that do not go away. You develop jaundice. Summary Cholelithiasis is a disease in which gallstones form in the gallbladder. This condition may be caused by an imbalance in the different parts that make bile. This can happen if your bile has too much  bilirubin or cholesterol, or does not have enough bile salts. Treatment for gallstones depends on the severity of the condition. Silent gallstones do not need treatment. If gallstones cause a gallbladder attack or other symptoms, treatment usually involves not eating or drinking anything. Treatment may also include pain medicines and antibiotics, and it sometimes includes a hospital stay. Surgery to remove the gallbladder is common if all other treatments have not worked. This information is not intended to replace advice given to you by your health care provider. Make sure you discuss any questions you have with your health care provider. Document Revised: 06/02/2019 Document Reviewed: 06/02/2019 Elsevier Patient Education  [place].

## 2022-06-29 NOTE — Progress Notes (Signed)
[facility] History and Physical  Reason for Referral:Gallstones  Referring Physician:ED   Chief Complaint   Gallbladder Issues     Steven [name] is a 71 y.o. male.  HPI: Steven [name] is a 71 yo with multiple medical issues and multiple complaints today. He is having significant issues with dizziness and headaches and is being worked up for this but also is having some nausea. He also reports he has seen Neurology in the past and has been treated for migraines but was recently seen at [facility] ED and they felt like he was having tinnitus and this was causing his issues.  He came to our ED at some point and was having nausea and some chest pain/ epigastric pain and decreased appetite. He had a CT chest that demonstrated the gallstones. He really does not complain of any RUQ pain. He says that he is nauseated at time and has had issues eating much of anything but his biggest complaint is his dizziness.  He has IBS and reports some constipation and diarrhea. He has not had an EGD in the past. He has lost 15 lbs in November.   Past Medical History:  Diagnosis Date   Anxiety    Arthritis    Atrial fibrillation (HCC)    Atrial flutter (HCC)    BPH (benign prostatic hyperplasia)    Chest pain    Dizziness    Dyspnea    laying down occ   Fracture 08/17/2015   MULTIPLE RIB FRACTURES     FROM FALL    GERD (gastroesophageal reflux disease)    Hemorrhoids    History of kidney stones    noted on CT scan   History of radiation therapy 08/22/11-10/13/11   prostate   Hyperlipemia    Hypertension    Hyperthyroidism    IBS (irritable bowel syndrome)    Insomnia    Light headedness    Migraine    Numbness and tingling in left arm    Numbness and tingling of both legs    Open fracture of left elbow 08/18/2015   Prostate cancer [facility])    prostate s/p radiation Mar 2013   Rib fractures 08/17/2015   Tinnitus     Past Surgical History:  Procedure Laterality Date   BOTOX  INJECTION N/A 01/14/2020   Procedure: INJECTION OF BOTOX INTO ANAL SPHINCTER;  Surgeon: Ileana Roup, MD;  Location: [facility] ORS;  Service: General;  Laterality: N/A;   COLONOSCOPY N/A 12/19/2012   IRW:ERXVQM 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 Normal appearing colon   COLONOSCOPY WITH PROPOFOL N/A 10/23/2019   Procedure: COLONOSCOPY WITH PROPOFOL;  Surgeon: Daneil Dolin, MD; external and grade 2 internal hemorrhoids, abnormal rectal blood vessels consistent with radiation proctitis s/p APC therapy, otherwise normal exam.   EVALUATION UNDER ANESTHESIA WITH ANAL FISTULECTOMY N/A 01/14/2020   Procedure: ANORECTAL EXAM UNDER ANESTHESIA;  Surgeon: Ileana Roup, MD;  Location: [facility] ORS;  Service: General;  Laterality: N/A;   HOT HEMOSTASIS  10/23/2019   Procedure: HOT HEMOSTASIS (ARGON PLASMA COAGULATION/BICAP);  Surgeon: Daneil Dolin, MD;  Location: [facility] ENDO SUITE;  Service: Endoscopy;;  apc rectal proctitis     KIDNEY SURGERY  1982   kidney tube collapse repair   RADIOACTIVE SEED IMPLANT     Prostate    Family History  Problem Relation Age of Onset   Aneurysm Mother        aortic  Hypertension Mother    Headache Mother    Prostate cancer Father    Hypertension Father    Colon cancer Neg Hx    Colon polyps Neg Hx     Social History   Tobacco Use   Smoking status: Former    Packs/day: 1.00    Years: 20.00    Total pack years: 20.00    Types: Cigarettes    Quit date: 2020    Years since quitting: 3.9   Smokeless tobacco: Never   Tobacco comments:    Quit smoking x 2 years    07/2015   SOMETIIMES i USE VAPOR   Vaping Use   Vaping Use: Never used  Substance Use Topics   Alcohol use: Never   Drug use: Never    Medications: I have reviewed the patient's current medications. Allergies as of 06/29/2022       Reactions   Latex Rash   Possible reaction to latex gloves per patient   Meclizine    Other  reaction(s): Abdominal Pain, Other        Medication List        Accurate as of June 29, 2022  3:05 PM. If you have any questions, ask your nurse or doctor.          Aimovig 70 MG/ML Soaj Generic drug: Erenumab-aooe Inject 1 mL into the skin every 30 (thirty) days.   alfuzosin 10 MG 24 hr tablet Commonly known as: UROXATRAL Take 1 tablet (10 mg total) by mouth daily with breakfast.   amLODipine 10 MG tablet Commonly known as: NORVASC Take 1 tablet by mouth daily.   carvedilol 6.25 MG tablet Commonly known as: COREG Take 1 tablet (6.25 mg total) by mouth 2 (two) times daily.   diazepam 5 MG tablet Commonly known as: VALIUM Take 1 tablet (5 mg total) by mouth 2 (two) times daily.   Eliquis 5 MG Tabs tablet Generic drug: apixaban TAKE 1 TABLET(5 MG) BY MOUTH TWICE DAILY What changed: See the new instructions.   fluticasone 50 MCG/ACT nasal spray Commonly known as: FLONASE Place 1 spray into both nostrils daily.   gabapentin 300 MG capsule Commonly known as: NEURONTIN Take 300 mg by mouth 3 (three) times daily.   hydrochlorothiazide 25 MG tablet Commonly known as: HYDRODIURIL Take 25 mg by mouth daily.   losartan 100 MG tablet Commonly known as: COZAAR Take 100 mg by mouth daily.   Nexletol 180 MG Tabs Generic drug: Bempedoic Acid Take 180 mg by mouth daily. Pt taking 2-3 times a week   omeprazole 20 MG capsule Commonly known as: PRILOSEC Take 1 capsule (20 mg total) by mouth 2 (two) times daily before a meal. What changed: additional instructions   polyethylene glycol 17 g packet Commonly known as: MIRALAX / GLYCOLAX Take by mouth daily.   potassium chloride SA 20 MEQ tablet Commonly known as: KLOR-CON M Take 20 mEq by mouth See admin instructions. 20 meq twice a day and 3 times a day when needed for potassium.   predniSONE 10 MG tablet Commonly known as: DELTASONE 6, 5, 4, 3, 2 then 1 tablet by mouth daily for 6 days total.   PROBIOTIC-10  PO Take 1 capsule by mouth daily.   promethazine 12.5 MG tablet Commonly known as: PHENERGAN Take 12.5 mg by mouth every 6 (six) hours as needed for nausea or vomiting.   Vitamin D3 125 MCG (5000 UT) Caps Take 1 capsule by mouth daily.  ROS:  A comprehensive review of systems was negative except for: Eyes: positive for blurred eyes Gastrointestinal: positive for nausea and reflux symptoms Neurological: positive for dizziness and headaches  Blood pressure 117/74, pulse 76, temperature 98.6 F (37 C), temperature source Oral, resp. rate 16, height '5\' 7"'$  (1.702 m), weight 206 lb (93.4 kg), SpO2 97 %. Physical Exam  Results: Results for orders placed or performed during the hospital encounter of 06/28/22 (from the past 48 hour(s))  Basic metabolic panel     Status: Abnormal   Collection Time: 06/28/22 12:25 PM  Result Value Ref Range   Sodium 137 135 - 145 mmol/L   Potassium 3.3 (L) 3.5 - 5.1 mmol/L   Chloride 101 98 - 111 mmol/L   CO2 27 22 - 32 mmol/L   Glucose, Bld 109 (H) 70 - 99 mg/dL    Comment: Glucose reference range applies only to samples taken after fasting for at least 8 hours.   BUN 8 8 - 23 mg/dL   Creatinine, Ser 0.95 0.61 - 1.24 mg/dL   Calcium 9.6 8.9 - 10.3 mg/dL   GFR, Estimated >60 >60 mL/min    Comment: (NOTE) Calculated using the CKD-EPI Creatinine Equation (2021)    Anion gap 9 5 - 15    Comment: Performed at [facility], [address], [place], [place] [address]  CBC     Status: None   Collection Time: 06/28/22 12:25 PM  Result Value Ref Range   WBC 4.2 4.0 - 10.5 K/uL   RBC 4.62 4.22 - 5.81 MIL/uL   Hemoglobin 13.5 13.0 - 17.0 g/dL   HCT 40.6 39.0 - 52.0 %   MCV 87.9 80.0 - 100.0 fL   MCH 29.2 26.0 - 34.0 pg   MCHC 33.3 30.0 - 36.0 g/dL   RDW 12.3 11.5 - 15.5 %   Platelets 249 150 - 400 K/uL   nRBC 0.0 0.0 - 0.2 %    Comment: Performed at [facility], [address] [place], [place] [address]  Troponin I (High Sensitivity)      Status: None   Collection Time: 06/28/22 12:25 PM  Result Value Ref Range   Troponin I (High Sensitivity) 8 <18 ng/L    Comment: (NOTE) Elevated high sensitivity troponin I (hsTnI) values and significant  changes across serial measurements may suggest ACS but many other  chronic and acute conditions are known to elevate hsTnI results.  Refer to the "Links" section for chest pain algorithms and additional  guidance. Performed at [facility], [address], [place], [place] [address]   Troponin I (High Sensitivity)     Status: None   Collection Time: 06/28/22  2:28 PM  Result Value Ref Range   Troponin I (High Sensitivity) 7 <18 ng/L    Comment: (NOTE) Elevated high sensitivity troponin I (hsTnI) values and significant  changes across serial measurements may suggest ACS but many other  chronic and acute conditions are known to elevate hsTnI results.  Refer to the "Links" section for chest pain algorithms and additional  guidance. Performed at [facility], [address], [place], [place] [address]   Hepatic function panel     Status: None   Collection Time: 06/28/22  4:08 PM  Result Value Ref Range   Total Protein 7.3 6.5 - 8.1 g/dL   Albumin 4.1 3.5 - 5.0 g/dL   AST 20 15 - 41 U/L   ALT 20 0 - 44 U/L   Alkaline Phosphatase 44 38 -  126 U/L   Total Bilirubin 0.8 0.3 - 1.2 mg/dL   Bilirubin, Direct 0.2 0.0 - 0.2 mg/dL   Indirect Bilirubin 0.6 0.3 - 0.9 mg/dL    Comment: Performed at [facility], [address], [place], [place] [address]  Lipase, blood     Status: None   Collection Time: 06/28/22  4:08 PM  Result Value Ref Range   Lipase 33 11 - 51 U/L    Comment: Performed at [facility], [address] [place], [place] [address]    Personally reviewed- gallstones CLINICAL DATA:  Abnormal x-ray. Dizziness and loss of appetite. Indigestion. Chest tightness. Left basilar opacity on chest x-ray.   EXAM: CT CHEST WITH CONTRAST   TECHNIQUE: Multidetector CT imaging of the  chest was performed during intravenous contrast administration.   RADIATION DOSE REDUCTION: This exam was performed according to the departmental dose-optimization program which includes automated exposure control, adjustment of the mA and/or kV according to patient size and/or use of iterative reconstruction technique.   CONTRAST:  40m OMNIPAQUE IOHEXOL 300 MG/ML  SOLN   COMPARISON:  Acute abdominal series 06/22/2022. Two-view chest x-ray 06/09/2022   FINDINGS: Cardiovascular: Heart size is normal. Aortic arch and great vessel origins are within normal limits. Pulmonary arteries are unremarkable. No significant pericardial effusion is present.   Mediastinum/Nodes: No enlarged mediastinal, hilar, or axillary lymph nodes. Thyroid gland, trachea, and esophagus demonstrate no significant findings.   Lungs/Pleura: Minimal atelectasis is present at the left base. The left hemidiaphragm is elevated, constituting majority of appearance of the soft tissue on the chest x-rays. No nodule mass lesion or other airspace disease is present. No significant pleural disease is present.   Upper Abdomen: Multiple hepatic cysts are stable from a CT scan 07/08/2020. Layering gallstones are present without inflammatory changes about the gallbladder. Visualized upper abdomen is otherwise unremarkable.   Musculoskeletal: Leftward curvature of the lower thoracic spine is centered at T9. Fused anterior osteophytes are present T2 through T10. Ribs are unremarkable.   IMPRESSION: 1. Elevated left hemidiaphragm, constituting majority of appearance of the soft tissue on the chest x-rays. 2. Minimal atelectasis at the left base. 3. Cholelithiasis without evidence for cholecystitis. 4. Leftward curvature of the lower thoracic spine is centered at T9. 5. Fused anterior osteophytes T2 through T10 compatible with DISH.     Electronically Signed   By: CSan MorelleM.D.   On: 06/22/2022 15:58    DG Chest 2 View  Result Date: 06/28/2022 CLINICAL DATA:  Chest pain EXAM: CHEST - 2 VIEW COMPARISON:  X-ray dated June 22, 2022 FINDINGS: Cardiac and mediastinal contours are within normal limits. Bilateral diaphragm eventration and left basilar atelectasis. Lungs are otherwise clear. No evidence of pleural effusion or pneumothorax IMPRESSION: Bilateral diaphragm eventration and left basilar atelectasis, similar to prior exam. Electronically Signed   By: LYetta GlassmanM.D.   On: 06/28/2022 12:50     Assessment & Plan:  Steven JEFFis a 71y.o. male with gallstones but normal Lfts and no real RUQ pain. He has had nausea and decreased appetite in the setting of dizziness and headaches, possibly tinnitus? He says he knows people that had problems for nausea and they feel so much better after getting their gallbladder out. I have discussed with him he has a lot of issues that can cause nausea and that I do not want to rush into taking his gallbladder out.    Try to get in with Neurology regarding your dizziness and  possible inner ear issue.  I will touch base with GI about a potential upper endoscopy.  Pay attention to your diet and if food is making you have any right sided pain or your nausea worse, specifically greasy fatty food.   I will follow up in January.   Future Appointments  Date Time Provider [place]  08/01/2022  9:45 AM Virl Cagey, MD RS-RS None  08/07/2022 11:00 AM Genia Harold, MD GNA-GNA None  08/09/2022  9:00 AM Sherron Monday, NP [facility]  04/11/2023 11:10 AM [name], Candee Furbish, MD AUR-AUR None    All questions were answered to the satisfaction of the patient.   Virl Cagey 06/29/2022, 3:05 PM

## 2022-07-03 ENCOUNTER — Telehealth: Payer: Self-pay | Admitting: Cardiovascular Disease

## 2022-07-03 NOTE — Telephone Encounter (Signed)
error 

## 2022-07-03 NOTE — Telephone Encounter (Signed)
Pt has appt w/ provider on 12/29.

## 2022-07-03 NOTE — Telephone Encounter (Signed)
Patient c/o Palpitations:  High priority if patient c/o lightheadedness, shortness of breath, or chest pain  How long have you had palpitations/irregular HR/ Afib? Are you having the symptoms now? Started about a month ago   Are you currently experiencing lightheadedness, SOB or CP? No  Do you have a history of afib (atrial fibrillation) or irregular heart rhythm? Yes  Have you checked your BP or HR? (document readings if available): No   Are you experiencing any other symptoms? Dizzy when moving around   Took 3 tablets of Dramamine last night due to nausea and dizziness. States fluttering in chest is more frequent when moving around. Reports when he coughs it helps with fluttering. Please advise.

## 2022-07-03 NOTE — Telephone Encounter (Signed)
Called patient back about message. Patient stated for about a month he has been have episodes of palpitations and fluttering in his chest. Patient has been taking is his eliquis. Patient stated he is not having any symptoms right now. Patient wants to be seen in [place]. Will send call to [facility] triage to see if they can call patient and find him an appointment to get evaluated.

## 2022-07-04 ENCOUNTER — Other Ambulatory Visit: Payer: Self-pay | Admitting: Gastroenterology

## 2022-07-04 DIAGNOSIS — R112 Nausea with vomiting, unspecified: Secondary | ICD-10-CM

## 2022-07-05 ENCOUNTER — Telehealth: Payer: Self-pay

## 2022-07-05 ENCOUNTER — Telehealth: Payer: Self-pay | Admitting: *Deleted

## 2022-07-05 NOTE — Telephone Encounter (Signed)
Returned the pt's call and was advised by him "he was just sick" and didn't know what to do. He states he is having a sharp pain left lower abdomen (only when having a BM). Then the pt get's nauseated with a headache. I asked him was he taking his Phenergan and he said no and he answered no to his Miralax. He stated he was having hot spells with a headache and he took 2 Tylenol and it stopped. Pt advises he may go to the ED because he is sick. He also says he believes all of this is coming from him being constipated. Please advise

## 2022-07-05 NOTE — Telephone Encounter (Signed)
Received call from patient [phone]~ telephone.   Patient reports that he continues to have LLQ abdominal pain, L shoulder pain, and chest pain/ fluttering after having BM.   Advised that this is not traditional gallbladder Sx. Advised to follow up with GI for his IBS and cardiology per ER.

## 2022-07-06 NOTE — Telephone Encounter (Signed)
Make sure he is taking Benefiber daily, Miralax daily. Call if no improvement.

## 2022-07-06 NOTE — Telephone Encounter (Signed)
Phoned and advised the pt of his recommendations from [facility]. Pt states is that all. Was advised yes and to drink plenty of fluids with both. Advised he has to be able to make things soft so it will pass without pain. Pt expressed understanding

## 2022-07-07 DIAGNOSIS — Z0001 Encounter for general adult medical examination with abnormal findings: Secondary | ICD-10-CM | POA: Diagnosis not present

## 2022-07-07 DIAGNOSIS — G4489 Other headache syndrome: Secondary | ICD-10-CM | POA: Diagnosis not present

## 2022-07-07 DIAGNOSIS — R110 Nausea: Secondary | ICD-10-CM | POA: Diagnosis not present

## 2022-07-07 DIAGNOSIS — Z9229 Personal history of other drug therapy: Secondary | ICD-10-CM | POA: Diagnosis not present

## 2022-07-11 DIAGNOSIS — M5412 Radiculopathy, cervical region: Secondary | ICD-10-CM | POA: Diagnosis not present

## 2022-07-11 DIAGNOSIS — G43709 Chronic migraine without aura, not intractable, without status migrainosus: Secondary | ICD-10-CM | POA: Diagnosis not present

## 2022-07-11 DIAGNOSIS — M4722 Other spondylosis with radiculopathy, cervical region: Secondary | ICD-10-CM | POA: Diagnosis not present

## 2022-07-11 DIAGNOSIS — I4891 Unspecified atrial fibrillation: Secondary | ICD-10-CM | POA: Diagnosis not present

## 2022-07-11 DIAGNOSIS — E230 Hypopituitarism: Secondary | ICD-10-CM | POA: Diagnosis not present

## 2022-07-11 DIAGNOSIS — I10 Essential (primary) hypertension: Secondary | ICD-10-CM | POA: Diagnosis not present

## 2022-07-12 NOTE — Progress Notes (Signed)
GI Office Note    Referring Provider: Redmond School, MD Primary Care Physician:  Redmond School, MD Primary Gastroenterologist: Cristopher Estimable.Rourk, MD  Date:  07/13/2022  ID:  Steven [name], DOB [birth year], MRN [id number]   Chief Complaint   Chief Complaint  Patient presents with   Abdominal Pain    Pain on left side when he has a BM    History of Present Illness  Steven [name] is a 71 y.o. male with a history of rectal pain in the setting of chronic anal fissure, previously failed topical treatment underwent Botox therapy with [facility] surgery and referred to [facility] with recommendations to treat supportively with focus on management of chronic constipation/IBS, weight loss send diagnosed with subacute thyroiditis treated with course of methimazole and weight loss resolved, previously with morning nausea resolved with omeprazole daily presenting today with complaint of constant feeling of being sick with nausea, left lower quadrant pain, and headaches with bowel movements.  Prior treatment for IBS/constipation with Colace, failed Trulance due to diarrhea, various doses of MiraLAX and fiber supplementation.  Last in person office visit 05/10/2022 with [name], PA.  Noted to have improvement with rectal pain with diltiazem cream in the past.  Had experience every time of diarrhea that had recently resolved.  Having 1 bowel movement every morning, sometimes incomplete.  At this time he was taking 1 capful of MiraLAX at night and 2-3 chewable fiber supplements daily.  Denied any sharp rectal pain but was having occasional rectal discomfort and using diltiazem cream daily or other over-the-counter hemorrhoid cream.  Reported a rash to the top of his butt.  Also reported issues with migraines and intermittent nausea that was not routine.  Taking omeprazole 20 mg daily.  Denied any vomiting or weight loss.  Denied any significant abdominal pain.  Advised to stop diltiazem rectal cream  and use over-the-counter hemorrhoid cream as needed for rectal discomfort.  Was given Clotrimazole cream twice daily for 7 days to use on rash on buttock.  Continue MiraLAX 17 g nightly and chewable fiber supplementation as well as omeprazole 20 mg daily.  Advised to follow-up in 6 months.  Video visit 06/28/22 with Roseanne Kaufman, NP for dizziness, nausea, constipation.  There are some technical difficulties with video visit, possibly was performed via telephone.  He stated he was having a bowel movement first thing in the morning and stools were not hard but was having to strain, also having lots of gas and nausea.  Reportedly felt like he was going to pass out on the toilet at one point.  Felt dizzy when he stood up to wipe.  Currently taking MiraLAX 1 capful daily.  Said he tried Metamucil in the past but was not currently taking anything.  Has a bowel movement in the morning and 30 minutes later another one.  Reported chronic dizziness and migraines.  Advised to add back Benefiber, continue MiraLAX.  Advised to follow-up with cardiology and urology and return in 6 weeks.  Patient called in 07/05/2022 reporting "he is sick" and was unsure what to do.  Stating he is having sharp left lower quadrant pain when having a bowel movement, gets nauseous with headaches.  Not taking Phenergan and stated he was not taking MiraLAX.  Also reported having hot spells with headache that improved after taking 2 Tylenol pills.  Stated he thought his symptoms were coming from being constipated.  He again was advised to take Benefiber daily as well as MiraLAX  daily.  He was seen in the ED 06/28/2022 for reported left-sided chest pain rating down his arm and also reported chronic dizziness.  Was evaluated in the ED at [facility] 06/25/2022 also for dizziness and had a negative workup.  Patient had reported he was referred to an audiologist for chronic tinnitus and suspected middle ear problem causing his dizziness.  During his ED visit  at [facility] he underwent a chest CT which revealed gallstones so he was referred to general surgery.  In the ED he was given Valium for dizziness and prednisone for inflammation and was advised to follow-up with his PCP as well as ENT for further audiology evaluation.  He was seen by general surgery regarding cholelithiasis who felt as though his nausea and dizziness should be evaluated with an EGD rather than having his gallbladder removed.  Received notification from general surgery office stating that patient was calling daily  with varied complaints of abdominal pain, constipation, nausea, and chest pain.  And I recommended early interval follow-up to further assess patient's symptoms in person given last visit was conducted via telephone.  Today: Left lower quadrant abdominal pain, constipation: Has pain with a BM in his left side and down his leg. Bigger the bowel movement the more significant the pain he has. Has been having more trouble going. Having to strain and having small amounts of stools. Has been trying to take benefiber instead of metamucil and thinks fiber is making his stool be too bulky and hard to go. Drank some prune juice yesterday afternoon and had more diarrhea with that but still needed to strain and that does produce more gas for him. Taking the miralax once a day in the evening. States he took Linzess in the past and had too much diarrhea from that at low dose.   Has been watching is fat intake and at times he has some acid reflux. States his morning PPI has been making him nauseas. Has not been taking his omeprazole. Has pepcid at home if needed. Took a reglan this morning that he was given from the ED. Takes as needed and that helps him.   Can take milk of magnesia double dose and he was up all night long with diarrhea.   Had off and on episodes of abdominal pain associated with dizziness and nausea etc.   States he had a fissure in the past with pain in the left rectal  area.   Lost appetite with his recent thyroid issues and had lost his appetite briefly. Appetite has been improved since he received a shot at his doctor. Has been eating more whole grains and stopped white bread. Taking a daily chewable fiber supplement daily. At dinner he typically eats a vegetable and a light protein.   Has been using the diltiazem cream with bowel movements and feels lingering pain. Has been doing warm soaks to help with his rectal discomfort. Has pain/discomfort even with diarrhea.   Has been wearing a patch to help with his tinnitus.   Current Outpatient Medications  Medication Sig Dispense Refill   alfuzosin (UROXATRAL) 10 MG 24 hr tablet Take 1 tablet (10 mg total) by mouth daily with breakfast. 90 tablet 3   amLODipine (NORVASC) 10 MG tablet Take 1 tablet by mouth daily.     carvedilol (COREG) 6.25 MG tablet Take 1 tablet (6.25 mg total) by mouth 2 (two) times daily. 180 tablet 3   Cholecalciferol (VITAMIN D3) 125 MCG (5000 UT) CAPS Take 1 capsule  by mouth daily.      diazepam (VALIUM) 5 MG tablet Take 1 tablet (5 mg total) by mouth 2 (two) times daily. 20 tablet 0   ELIQUIS 5 MG TABS tablet TAKE 1 TABLET(5 MG) BY MOUTH TWICE DAILY (Patient taking differently: Take 5 mg by mouth 2 (two) times daily.) 60 tablet 5   Erenumab-aooe (AIMOVIG) 70 MG/ML SOAJ Inject 1 mL into the skin every 30 (thirty) days.     fluticasone (FLONASE) 50 MCG/ACT nasal spray Place 1 spray into both nostrils daily.     gabapentin (NEURONTIN) 300 MG capsule Take 300 mg by mouth 3 (three) times daily.     hydrochlorothiazide (HYDRODIURIL) 25 MG tablet Take 25 mg by mouth daily.     losartan (COZAAR) 100 MG tablet Take 100 mg by mouth daily.     NEXLETOL 180 MG TABS Take 180 mg by mouth daily. Pt taking 2-3 times a week     omeprazole (PRILOSEC) 20 MG capsule TAKE 1 CAPSULE(20 MG) BY MOUTH TWICE DAILY BEFORE A MEAL 60 capsule 3   polyethylene glycol (MIRALAX / GLYCOLAX) 17 g packet Take by mouth  daily.     potassium chloride SA (KLOR-CON) 20 MEQ tablet Take 20 mEq by mouth See admin instructions. 20 meq twice a day and 3 times a day when needed for potassium.     predniSONE (DELTASONE) 10 MG tablet 6, 5, 4, 3, 2 then 1 tablet by mouth daily for 6 days total. 21 tablet 0   Probiotic Product (PROBIOTIC-10 PO) Take 1 capsule by mouth daily.     promethazine (PHENERGAN) 12.5 MG tablet Take 12.5 mg by mouth every 6 (six) hours as needed for nausea or vomiting.     No current facility-administered medications for this visit.    Past Medical History:  Diagnosis Date   Anxiety    Arthritis    Atrial fibrillation (HCC)    Atrial flutter (HCC)    BPH (benign prostatic hyperplasia)    Chest pain    Dizziness    Dyspnea    laying down occ   Fracture 08/17/2015   MULTIPLE RIB FRACTURES     FROM FALL    GERD (gastroesophageal reflux disease)    Hemorrhoids    History of kidney stones    noted on CT scan   History of radiation therapy 08/22/11-10/13/11   prostate   Hyperlipemia    Hypertension    Hyperthyroidism    IBS (irritable bowel syndrome)    Insomnia    Light headedness    Migraine    Numbness and tingling in left arm    Numbness and tingling of both legs    Open fracture of left elbow 08/18/2015   Prostate cancer [facility])    prostate s/p radiation Mar 2013   Rib fractures 08/17/2015   Tinnitus     Past Surgical History:  Procedure Laterality Date   BOTOX INJECTION N/A 01/14/2020   Procedure: INJECTION OF BOTOX INTO ANAL SPHINCTER;  Surgeon: Ileana Roup, MD;  Location: [facility] ORS;  Service: General;  Laterality: N/A;   COLONOSCOPY N/A 12/19/2012   RDE:YCXKGY 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 Normal appearing colon   COLONOSCOPY WITH PROPOFOL N/A 10/23/2019   Procedure: COLONOSCOPY WITH PROPOFOL;  Surgeon: Daneil Dolin, MD; external and grade 2 internal hemorrhoids, abnormal rectal blood vessels  consistent with radiation proctitis s/p APC therapy, otherwise normal exam.   EVALUATION UNDER ANESTHESIA  WITH ANAL FISTULECTOMY N/A 01/14/2020   Procedure: ANORECTAL EXAM UNDER ANESTHESIA;  Surgeon: Ileana Roup, MD;  Location: [facility] ORS;  Service: General;  Laterality: N/A;   HOT HEMOSTASIS  10/23/2019   Procedure: HOT HEMOSTASIS (ARGON PLASMA COAGULATION/BICAP);  Surgeon: Daneil Dolin, MD;  Location: [facility] SUITE;  Service: Endoscopy;;  apc rectal proctitis     KIDNEY SURGERY  1982   kidney tube collapse repair   RADIOACTIVE SEED IMPLANT     Prostate    Family History  Problem Relation Age of Onset   Aneurysm Mother        aortic   Hypertension Mother    Headache Mother    Prostate cancer Father    Hypertension Father    Colon cancer Neg Hx    Colon polyps Neg Hx     Allergies as of 07/13/2022 - Review Complete 07/13/2022  Allergen Reaction Noted   Latex Rash 03/25/2020   Meclizine  09/29/2021    Social History   Socioeconomic History   Marital status: Single    Spouse name: Not on file   Number of children: 2   Years of education: Not on file   Highest education level: 12th grade  Occupational History   Occupation: Custodian     Employer: [employer]  Tobacco Use   Smoking status: Former    Packs/day: 1.00    Years: 20.00    Total pack years: 20.00    Types: Cigarettes    Quit date: 2020    Years since quitting: 3.9   Smokeless tobacco: Never   Tobacco comments:    Quit smoking x 2 years    07/2015   SOMETIIMES i USE VAPOR   Vaping Use   Vaping Use: Never used  Substance and Sexual Activity   Alcohol use: Never   Drug use: Never   Sexual activity: Not Currently  Other Topics Concern   Not on file  Social History Narrative   Lives with friend   Divorced   No caffeine   Social Determinants of Health   Financial Resource Strain: Low Risk  (11/03/2020)   Overall Financial Resource Strain (CARDIA)    Difficulty of Paying Living  Expenses: Not hard at all  Food Insecurity: No Food Insecurity (11/03/2020)   Hunger Vital Sign    Worried About Running Out of Food in the Last Year: Never true    Ran Out of Food in the Last Year: Never true  Transportation Needs: No Transportation Needs (11/03/2020)   PRAPARE - Hydrologist (Medical): No    Lack of Transportation (Non-Medical): No  Physical Activity: Inactive (11/03/2020)   Exercise Vital Sign    Days of Exercise per Week: 0 days    Minutes of Exercise per Session: 0 min  Stress: No Stress Concern Present (11/03/2020)   [place]    Feeling of Stress : Not at all  Social Connections: Socially Isolated (11/03/2020)   Social Connection and Isolation Panel [NHANES]    Frequency of Communication with Friends and Family: Twice a week    Frequency of Social Gatherings with Friends and Family: Once a week    Attends Religious Services: Never    Marine scientist or Organizations: No    Attends Music therapist: Never    Marital Status: Divorced     Review of Systems   Gen: Denies fever, chills, anorexia. Denies  fatigue, weakness, weight loss.  CV: Denies chest pain, palpitations, syncope, peripheral edema, and claudication. Resp: Denies dyspnea at rest, cough, wheezing, coughing up blood, and pleurisy. GI: See HPI Derm: Denies rash, itching, dry skin Psych: Denies depression, anxiety, memory loss, confusion. No homicidal or suicidal ideation.  Heme: Denies bruising, bleeding, and enlarged lymph nodes.   Physical Exam   BP 111/67 (BP Location: Right Arm, Patient Position: Sitting, Cuff Size: Normal)   Pulse 75   Temp 98.1 F (36.7 C) (Temporal)   Ht '5\' 7"'$  (1.702 m)   Wt 206 lb (93.4 kg)   SpO2 97%   BMI 32.26 kg/m   General:   Alert and oriented. No distress noted. Pleasant and cooperative.  Head:  Normocephalic and atraumatic. Eyes:  Conjuctiva  clear without scleral icterus. Mouth:  Oral mucosa pink and moist. Good dentition. No lesions. Lungs:  Clear to auscultation bilaterally. No wheezes, rales, or rhonchi. No distress.  Heart:  S1, S2 present without murmurs appreciated.  Abdomen:  +BS, soft, non-tender and non-distended. No rebound or guarding. No HSM or masses noted. Rectal: Mild discomfort on rectal exam.  No evidence of anal fissure.  No blood present.  Does have evidence of internal hemorrhoids, no external hemorrhoids.  Presence of small 1 cm mucosal skin tear in gluteal cleft Msk:  Symmetrical without gross deformities. Normal posture. Extremities:  Without edema. Neurologic:  Alert and  oriented x4 Psych:  Alert and cooperative. Normal mood and affect.   Assessment  Steven [name] is a 71 y.o. male with a history of rectal pain in the setting of chronic anal fissure, previously failed topical treatment underwent B[facility] therapy with [facility] and referred to [facility] with recommendations to treat supportively with focus on management of chronic constipation/IBS, weight loss send diagnosed with subacute thyroiditis treated with course of methimazole and weight loss resolved, previously with morning nausea resolved with omeprazole daily presenting today with complaint of constant feeling of being sick with nausea, left lower quadrant pain, and headaches with bowel movements.  Constipation, IBS, LLQ abdominal pain, rectal discomfort: No evidence of rectal fissure, hemorrhoids are present on exam. Has been using diltiazem rectal cream after bowel movements. Has relief of discomfort with warm soaks. Continues to have LLQ abdominal pain with BMs and at times extends down his leg. Has increased pain with larger Bms. Reports frequent straining. States he has tried Stage manager but feels like fiber bulks his stool too much and makes it painful to go. Not tolerated Linzess in the past due to diarrhea. Also has taken milk of magnesia  and prune juice and had significant diarrhea. Currently taking miralax once daily. Symptoms likely secondary to IBS-C given relation to bowel movements. Will trial Trulance 3 mg daily, samples provided. Advanced continue chewable fiber supplements. Has anxiety related to his pain and bowel movements. We discussed at length the diagnosis of IBS and treatment options. He is concerned given his abdominal pain and would like to have   Nausea, GERD: Has been having morning nausea which he attributes to his morning PPI, so he has stopped taking it. Taking famotidine if he feels like he needs it. Reports a previous loss of appetite with his thyroid issue however has recently improved. Nausea likely secondary to known vertigo for which he is currently being treated for with patches (unsure type). Advised he may use famotidine 20 mg BID in place of PPI if he feels as though this is making nausea worse. Discussed GERD diet/lifestyle  modifications including eating 4-6 small meals per day vs 3 larger meals.   Moisture associated dermatitis, skin tear: Moisture associated skin teat/dermatitis to mid gluteal cleft. Advised to stop vaseline and to use a zinc based skin paste and adequate hygiene measures ensuring area stays dry.   PLAN   May take famotidine 20 mg twice daily for reflux/nausea. Use gas ex as needed for flatulence.  Hold miralax while trialing Trulance daily, will provide samples Resume miralax but take twice per day if trulance not tolerated.  Continue daily fiber chewables Eat 4-6 small meals a day GERD diet/lifestyle modifications CT Abdomen/pelvis Stop using diltiazem cream, use preparation H cream  Use zinc based cream/paste to gluteal cleft skin tear Keep perineum dry and clean.  Daily protein supplement.  Follow up in 2 months   Venetia Night, MSN, FNP-BC, AGACNP-BC [facility]

## 2022-07-13 ENCOUNTER — Telehealth: Payer: Self-pay | Admitting: "Endocrinology

## 2022-07-13 ENCOUNTER — Encounter: Payer: Self-pay | Admitting: Gastroenterology

## 2022-07-13 ENCOUNTER — Ambulatory Visit (INDEPENDENT_AMBULATORY_CARE_PROVIDER_SITE_OTHER): Payer: Medicare Other | Admitting: Gastroenterology

## 2022-07-13 VITALS — BP 111/67 | HR 75 | Temp 98.1°F | Ht 67.0 in | Wt 206.0 lb

## 2022-07-13 DIAGNOSIS — R110 Nausea: Secondary | ICD-10-CM | POA: Diagnosis not present

## 2022-07-13 DIAGNOSIS — R1032 Left lower quadrant pain: Secondary | ICD-10-CM

## 2022-07-13 DIAGNOSIS — K649 Unspecified hemorrhoids: Secondary | ICD-10-CM | POA: Diagnosis not present

## 2022-07-13 DIAGNOSIS — K581 Irritable bowel syndrome with constipation: Secondary | ICD-10-CM

## 2022-07-13 DIAGNOSIS — K219 Gastro-esophageal reflux disease without esophagitis: Secondary | ICD-10-CM | POA: Diagnosis not present

## 2022-07-13 DIAGNOSIS — K6289 Other specified diseases of anus and rectum: Secondary | ICD-10-CM

## 2022-07-13 DIAGNOSIS — K5900 Constipation, unspecified: Secondary | ICD-10-CM | POA: Diagnosis not present

## 2022-07-13 DIAGNOSIS — L308 Other specified dermatitis: Secondary | ICD-10-CM

## 2022-07-13 NOTE — Patient Instructions (Addendum)
Protein shake such as fairlife, boost, premiere protein, glucerna, or splenda shakes.   Stop using diltiazem rectal cream.  Use a regular over-the-counter hemorrhoid cream such as Preparation H.  You may use this once or twice a day as needed for your hemorrhoids.  I am giving you samples of Trulance to try.  Take 1 tablet daily with or without food.  While you are taking this please stop taking MiraLAX.  If the Trulance causes you to have severe diarrhea, please stop.  Then resume MiraLAX 17 g twice daily to assist with better constipation.  Continue daily chewable fiber supplements.  I would like for you to begin taking famotidine 20 mg twice daily for your reflux since you feel that you have nausea with your omeprazole.  I would encourage you to eat 4-6 small meals per day rather than 3 large meals per day.  Please take a protein supplement as stated above.  Follow a GERD diet:  Avoid fried, fatty, greasy, spicy, citrus foods. Avoid caffeine and carbonated beverages. Avoid chocolate. Try eating 4-6 small meals a day rather than 3 large meals. Do not eat within 3 hours of laying down. Prop head of bed up on wood or bricks to create a 6 inch incline.  If your nausea is ongoing continues and you continue to have lack of appetite, we can consider performing an upper endoscopy.  I am ordering a CT scan of your abdomen and pelvis to further evaluate your left lower quadrant abdominal pain.  We will plan to see you in 2 months, or sooner if needed!  It was a pleasure to see you today. I want to create trusting relationships with patients. If you receive a survey regarding your visit,  I greatly appreciate you taking time to fill this out on paper or through your MyChart. I value your feedback.  Venetia Night, MSN, FNP-BC, AGACNP-BC [facility]

## 2022-07-13 NOTE — Progress Notes (Deleted)
Cardiology Office Note    Date:  07/13/2022   ID:  Steven [name], DOB [birth year], MRN [id number]  PCP:  Redmond School, MD  Cardiologist: Jenkins Rouge, MD    No chief complaint on file.    History of Present Illness:    Steven [name] is a 71 y.o. male with past medical history of paroxysmal atrial fibrillation/flutter, HTN, and HLD F/U Cardiac monitor done 04/09/19 for dizziness showed no long pauses and no more PAF. Myovue done 06/25/19 with no  Ischemia EF 62% Echo 03/02/19 EF 60-65% no valve disease Lopressor was decreased with no change in dizziness  Still has dizziness, swimmy headed feeling  Now on eliquis for anticoagulation BP only 749 systolic and w/u negative  Seen in ER 05/14/20 with elevated BP and malaise with dizziness  He sees Dr Merlene Laughter for headaches and was seen in ER for this 06/04/20 Takes Topamax and Gabapentin CT head negative for bleed or other pathology   Continues to have his issues with likely panic attacks with headaches, pounding in head and dizziness Multiple somatic complaints seem to eminate from anxiety  Seen again in ED 02/17/21 with fatigue and dizziness BP/HR telemetry normal   Was on prednisone for shingles ECG SR rate 56 normal 02/18/21   Feeling like his head is full dizziness all sound More vestibular Seen in ED 06/22/22 for dizziness and poor appetite ECG normal and telemetry normal   ***  Past Medical History:  Diagnosis Date   Anxiety    Arthritis    Atrial fibrillation (HCC)    Atrial flutter (HCC)    BPH (benign prostatic hyperplasia)    Chest pain    Dizziness    Dyspnea    laying down occ   Fracture 08/17/2015   MULTIPLE RIB FRACTURES     FROM FALL    GERD (gastroesophageal reflux disease)    Hemorrhoids    History of kidney stones    noted on CT scan   History of radiation therapy 08/22/11-10/13/11   prostate   Hyperlipemia    Hypertension    Hyperthyroidism    IBS (irritable bowel syndrome)    Insomnia    Light  headedness    Migraine    Numbness and tingling in left arm    Numbness and tingling of both legs    Open fracture of left elbow 08/18/2015   Prostate cancer [facility])    prostate s/p radiation Mar 2013   Rib fractures 08/17/2015   Tinnitus     Past Surgical History:  Procedure Laterality Date   BOTOX INJECTION N/A 01/14/2020   Procedure: INJECTION OF BOTOX INTO ANAL SPHINCTER;  Surgeon: Ileana Roup, MD;  Location: [facility];  Service: General;  Laterality: N/A;   COLONOSCOPY N/A 12/19/2012   SWH:QPRFFM 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 Normal appearing colon   COLONOSCOPY WITH PROPOFOL N/A 10/23/2019   Procedure: COLONOSCOPY WITH PROPOFOL;  Surgeon: Daneil Dolin, MD; external and grade 2 internal hemorrhoids, abnormal rectal blood vessels consistent with radiation proctitis s/p APC therapy, otherwise normal exam.   EVALUATION UNDER ANESTHESIA WITH ANAL FISTULECTOMY N/A 01/14/2020   Procedure: ANORECTAL EXAM UNDER ANESTHESIA;  Surgeon: Ileana Roup, MD;  Location: [facility];  Service: General;  Laterality: N/A;   HOT HEMOSTASIS  10/23/2019   Procedure: HOT HEMOSTASIS (ARGON PLASMA COAGULATION/BICAP);  Surgeon: Daneil Dolin, MD;  [facility];  Service: Endoscopy;;  apc  rectal proctitis     KIDNEY SURGERY  1982   kidney tube collapse repair   RADIOACTIVE SEED IMPLANT     Prostate    Current Medications: Outpatient Medications Prior to Visit  Medication Sig Dispense Refill   alfuzosin (UROXATRAL) 10 MG 24 hr tablet Take 1 tablet (10 mg total) by mouth daily with breakfast. 90 tablet 3   amLODipine (NORVASC) 10 MG tablet Take 1 tablet by mouth daily.     carvedilol (COREG) 6.25 MG tablet Take 1 tablet (6.25 mg total) by mouth 2 (two) times daily. 180 tablet 3   Cholecalciferol (VITAMIN D3) 125 MCG (5000 UT) CAPS Take 1 capsule by mouth daily.      diazepam (VALIUM) 5 MG tablet Take 1 tablet (5 mg  total) by mouth 2 (two) times daily. 20 tablet 0   ELIQUIS 5 MG TABS tablet TAKE 1 TABLET(5 MG) BY MOUTH TWICE DAILY (Patient taking differently: Take 5 mg by mouth 2 (two) times daily.) 60 tablet 5   Erenumab-aooe (AIMOVIG) 70 MG/ML SOAJ Inject 1 mL into the skin every 30 (thirty) days.     fluticasone (FLONASE) 50 MCG/ACT nasal spray Place 1 spray into both nostrils daily.     gabapentin (NEURONTIN) 300 MG capsule Take 300 mg by mouth 3 (three) times daily.     hydrochlorothiazide (HYDRODIURIL) 25 MG tablet Take 25 mg by mouth daily.     losartan (COZAAR) 100 MG tablet Take 100 mg by mouth daily.     NEXLETOL 180 MG TABS Take 180 mg by mouth daily. Pt taking 2-3 times a week     omeprazole (PRILOSEC) 20 MG capsule TAKE 1 CAPSULE(20 MG) BY MOUTH TWICE DAILY BEFORE A MEAL 60 capsule 3   polyethylene glycol (MIRALAX / GLYCOLAX) 17 g packet Take by mouth daily.     potassium chloride SA (KLOR-CON) 20 MEQ tablet Take 20 mEq by mouth See admin instructions. 20 meq twice a day and 3 times a day when needed for potassium.     predniSONE (DELTASONE) 10 MG tablet 6, 5, 4, 3, 2 then 1 tablet by mouth daily for 6 days total. (Patient not taking: Reported on 06/29/2022) 21 tablet 0   Probiotic Product (PROBIOTIC-10 PO) Take 1 capsule by mouth daily.     promethazine (PHENERGAN) 12.5 MG tablet Take 12.5 mg by mouth every 6 (six) hours as needed for nausea or vomiting.     No facility-administered medications prior to visit.     Allergies:   Latex and Meclizine   Social History   Socioeconomic History   Marital status: Single    Spouse name: Not on file   Number of children: 2   Years of education: Not on file   Highest education level: 12th grade  Occupational History   Occupation: Custodian     Employer: [employer]  Tobacco Use   Smoking status: Former    Packs/day: 1.00    Years: 20.00    Total pack years: 20.00    Types: Cigarettes    Quit date: 2020    Years since quitting:  3.9   Smokeless tobacco: Never   Tobacco comments:    Quit smoking x 2 years    07/2015   SOMETIIMES i USE VAPOR   Vaping Use   Vaping Use: Never used  Substance and Sexual Activity   Alcohol use: Never   Drug use: Never   Sexual activity: Not Currently  Other Topics Concern   Not  on file  Social History Narrative   Lives with friend   Divorced   No caffeine   Social Determinants of Health   Financial Resource Strain: Low Risk  (11/03/2020)   Overall Financial Resource Strain (CARDIA)    Difficulty of Paying Living Expenses: Not hard at all  Food Insecurity: No Food Insecurity (11/03/2020)   Hunger Vital Sign    Worried About Running Out of Food in the Last Year: Never true    Ran Out of Food in the Last Year: Never true  Transportation Needs: No Transportation Needs (11/03/2020)   PRAPARE - Hydrologist (Medical): No    Lack of Transportation (Non-Medical): No  Physical Activity: Inactive (11/03/2020)   Exercise Vital Sign    Days of Exercise per Week: 0 days    Minutes of Exercise per Session: 0 min  Stress: No Stress Concern Present (11/03/2020)   [place]    Feeling of Stress : Not at all  Social Connections: Socially Isolated (11/03/2020)   Social Connection and Isolation Panel [NHANES]    Frequency of Communication with Friends and Family: Twice a week    Frequency of Social Gatherings with Friends and Family: Once a week    Attends Religious Services: Never    Marine scientist or Organizations: No    Attends Music therapist: Never    Marital Status: Divorced     Family History:  The patient's family history includes Aneurysm in his mother; Prostate cancer in his father.   Review of Systems:   Please see the history of present illness.     General:  No chills, fever, night sweats or weight changes.  Cardiovascular:  No chest pain, dyspnea on exertion,  edema, orthopnea, palpitations, paroxysmal nocturnal dyspnea. Dermatological: No rash, lesions/masses Respiratory: No cough, dyspnea Urologic: No hematuria, dysuria Abdominal:   No nausea, vomiting, diarrhea, melena, or hematemesis. Positive for BRBPR.  Neurologic:  No visual changes, wkns, changes in mental status. Positive for dizziness.    All other systems reviewed and are otherwise negative except as noted above.   Physical Exam:    VS:  There were no vitals taken for this visit.    Affect appropriate Healthy:  appears stated age 8: healing herpetic breakout on brow  Neck supple with no adenopathy JVP normal no bruits no thyromegaly Lungs clear with no wheezing and good diaphragmatic motion Heart:  S1/S2 no murmur, no rub, gallop or click PMI normal Abdomen: benighn, BS positve, no tenderness, no AAA no bruit.  No HSM or HJR Distal pulses intact with no bruits No edema Neuro non-focal Skin warm and dry No muscular weakness   Wt Readings from Last 3 Encounters:  06/29/22 206 lb (93.4 kg)  06/28/22 216 lb (98 kg)  06/28/22 217 lb (98.4 kg)     Studies/Labs Reviewed:   EKG:  SR rate 70 normal  06/29/22   Recent Labs: 07/14/2021: TSH 0.415 06/28/2022: ALT 20; BUN 8; Creatinine, Ser 0.95; Hemoglobin 13.5; Platelets 249; Potassium 3.3; Sodium 137   Lipid Panel    Component Value Date/Time   CHOL 142 04/16/2020 0000   TRIG 74 04/16/2020 0000   HDL 48 04/16/2020 0000   CHOLHDL 4.4 03/02/2019 0329   VLDL 18 03/02/2019 0329   LDLCALC 79 04/16/2020 0000    Additional studies/ records that were reviewed today include:   Echocardiogram: 02/2019 IMPRESSIONS  1. The left ventricle has normal systolic function with an ejection fraction of 60-65%. The cavity size was normal. Left ventricular diastolic parameters were normal.  2. The right ventricle has normal systolic function. The cavity was normal. There is no increase in right ventricular wall thickness.   3. Mild thickening of the mitral valve leaflet.  4. The aortic valve is tricuspid. Mild thickening of the aortic valve. Mild calcification of the aortic valve.  5. The aorta is normal in size and structure.    Event Monitor: 03/2019 NSR  No significant arrhythmia No PAF  Myovue 06/25/19:  EF 62% soft tissue attenuation no ischemia   Assessment:    No diagnosis found.   Plan:   In order of problems listed above:  1. Paroxysmal Atrial Fibrillation/Flutter - Event monitor demonstrated no recurrence 04/09/19  but he was hospitalized at the end of 03/2019 for recurrent atrial fibrillation and spontaneously converted back to NSR. He continues to experience dizziness and I suspect this is not secondary to a cardiac etiology He had no improvement in his dizziness with decreasing his lopressor    2. HTN - Well controlled.  Continue current medications and low sodium Dash type diet.    3. HLD - followed by PCP. ? Now on Nexlitol previously on statin   4. Dizziness/Chest Pain - he continues to have dizziness which is being worked-up by Neurology and ENT. Had myovue 06/25/19 that was normal With no ischemia , soft tissue attenuation EF 62% low risk study. CT head 06/04/20 negative  He has had panic attacks in past and I suspect this is also playing a role in his frequent ER visits Given latter and 3 years since last stress test will f/u with cardiac CTA to further r/o CAD   Lopressor 100 mg prior to test BMET Cardiac CTA   F/u with cards in a year     Signed, Jenkins Rouge, MD  07/13/2022 10:27 AM    [facility]. [address] [place], [place] [address] Phone: [phone] Fax: [phone]

## 2022-07-13 NOTE — Telephone Encounter (Signed)
New message    The patient C/o Thyroid issues at another appointment asking for a call back tomorrow.

## 2022-07-14 ENCOUNTER — Encounter: Payer: Self-pay | Admitting: *Deleted

## 2022-07-14 ENCOUNTER — Telehealth: Payer: Self-pay | Admitting: Gastroenterology

## 2022-07-14 ENCOUNTER — Other Ambulatory Visit: Payer: Self-pay | Admitting: "Endocrinology

## 2022-07-14 ENCOUNTER — Telehealth: Payer: Self-pay | Admitting: *Deleted

## 2022-07-14 DIAGNOSIS — E2749 Other adrenocortical insufficiency: Secondary | ICD-10-CM

## 2022-07-14 NOTE — Telephone Encounter (Signed)
F/u   The last office visit  11/24/2019.    The patient is concern about his Thyroid.    C/o left side of neck hurting / shoulder  / hot flashes.    Upcoming appt 08/21/22

## 2022-07-14 NOTE — Telephone Encounter (Signed)
Patient was seen yesterday, 12/21, and called in this morning to say that he was advised to stop taking Myralax and to start taking Trulance (sp?) but he says that takes a week to work and wants to know what he is to do in the meantime.

## 2022-07-14 NOTE — Telephone Encounter (Signed)
Pt informed of appt date and time. He states he would like something later. Gave pt number to call and reschedule the procedure.

## 2022-07-14 NOTE — Telephone Encounter (Signed)
Discussed with pt, understanding voiced. 

## 2022-07-14 NOTE — Telephone Encounter (Signed)
Spoke with patient and informed him that it should not take a week for the Trulance to work for him to go ahead and start taking it. Pt verbalized understanding.

## 2022-07-14 NOTE — Telephone Encounter (Signed)
Pt states he's experiencing loss of appetite, pain L side of his neck, nausea, hot flashes, fatigue and weakness. His PCP had him draw labs and I placed them on your desk. Pt has an appointment on 1/29 with [place].

## 2022-07-14 NOTE — Telephone Encounter (Signed)
[employer] PA: Primary Diagnosis Code: K58.9 Description: Irritable bowel syndrome without diarrhea Secondary Diagnosis Code: R10.32 Description: Left lower quadrant pain CPT Code 16553 Description: CT ABDOMEN & PELVIS W/ Case Number: [id number] Review Date: 07/14/2022 7:38:29 AM Expiration Date: N/A Status: This member's benefit plan did not require a prior authorization for this request.

## 2022-07-14 NOTE — Telephone Encounter (Signed)
[facility]  CT scheduled for 08/22/22 at 8:00 am, arrive at 7:45 am, nothing to eat or drink after midnight.

## 2022-07-15 ENCOUNTER — Encounter (HOSPITAL_COMMUNITY): Payer: Self-pay | Admitting: Emergency Medicine

## 2022-07-15 ENCOUNTER — Emergency Department (HOSPITAL_COMMUNITY)
Admission: EM | Admit: 2022-07-15 | Discharge: 2022-07-16 | Disposition: A | Discharge status: Home / Self Care | Payer: Medicare Other | Attending: Emergency Medicine | Admitting: Emergency Medicine

## 2022-07-15 ENCOUNTER — Other Ambulatory Visit: Payer: Self-pay

## 2022-07-15 ENCOUNTER — Emergency Department (HOSPITAL_COMMUNITY): Payer: Medicare Other

## 2022-07-15 DIAGNOSIS — R109 Unspecified abdominal pain: Secondary | ICD-10-CM | POA: Insufficient documentation

## 2022-07-15 DIAGNOSIS — R1013 Epigastric pain: Secondary | ICD-10-CM | POA: Diagnosis not present

## 2022-07-15 DIAGNOSIS — K627 Radiation proctitis: Secondary | ICD-10-CM | POA: Diagnosis not present

## 2022-07-15 DIAGNOSIS — Z87891 Personal history of nicotine dependence: Secondary | ICD-10-CM | POA: Insufficient documentation

## 2022-07-15 DIAGNOSIS — I10 Essential (primary) hypertension: Secondary | ICD-10-CM | POA: Insufficient documentation

## 2022-07-15 DIAGNOSIS — R42 Dizziness and giddiness: Secondary | ICD-10-CM | POA: Insufficient documentation

## 2022-07-15 DIAGNOSIS — N200 Calculus of kidney: Secondary | ICD-10-CM | POA: Diagnosis not present

## 2022-07-15 LAB — CBC
HCT: 38.4 % — ABNORMAL LOW (ref 39.0–52.0)
Hemoglobin: 13 g/dL (ref 13.0–17.0)
MCH: 29.5 pg (ref 26.0–34.0)
MCHC: 33.9 g/dL (ref 30.0–36.0)
MCV: 87.3 fL (ref 80.0–100.0)
Platelets: 214 10*3/uL (ref 150–400)
RBC: 4.4 MIL/uL (ref 4.22–5.81)
RDW: 12.2 % (ref 11.5–15.5)
WBC: 4.1 10*3/uL (ref 4.0–10.5)
nRBC: 0 % (ref 0.0–0.2)

## 2022-07-15 LAB — COMPREHENSIVE METABOLIC PANEL
ALT: 25 U/L (ref 0–44)
AST: 17 U/L (ref 15–41)
Albumin: 3.6 g/dL (ref 3.5–5.0)
Alkaline Phosphatase: 36 U/L — ABNORMAL LOW (ref 38–126)
Anion gap: 6 (ref 5–15)
BUN: 10 mg/dL (ref 8–23)
CO2: 27 mmol/L (ref 22–32)
Calcium: 9.3 mg/dL (ref 8.9–10.3)
Chloride: 105 mmol/L (ref 98–111)
Creatinine, Ser: 0.76 mg/dL (ref 0.61–1.24)
GFR, Estimated: >60 mL/min (ref >60)
Glucose, Bld: 118 mg/dL — ABNORMAL HIGH (ref 70–99)
Potassium: 3.6 mmol/L (ref 3.5–5.1)
Sodium: 138 mmol/L (ref 135–145)
Total Bilirubin: 0.5 mg/dL (ref 0.3–1.2)
Total Protein: 6.5 g/dL (ref 6.5–8.1)

## 2022-07-15 LAB — LIPASE, BLOOD: Lipase: 33 U/L (ref 11–51)

## 2022-07-15 MED ORDER — SODIUM CHLORIDE 0.9 % IV BOLUS
500.0000 mL | Freq: Once | INTRAVENOUS | Status: AC
  Administered 2022-07-15: 500 mL via INTRAVENOUS

## 2022-07-15 MED ORDER — ONDANSETRON HCL 4 MG/2ML IJ SOLN
4.0000 mg | Freq: Once | INTRAMUSCULAR | Status: AC
  Administered 2022-07-15: 4 mg via INTRAVENOUS
  Filled 2022-07-15: qty 2

## 2022-07-15 MED ORDER — IOHEXOL 300 MG/ML  SOLN
100.0000 mL | Freq: Once | INTRAMUSCULAR | Status: AC | PRN
  Administered 2022-07-15: 100 mL via INTRAVENOUS

## 2022-07-15 NOTE — ED Triage Notes (Signed)
Pt c/o having IBS and hx of gallstones. Pt c/o upper abd pain and nausea x 3 weeks. C/o being dizziness since yesterday at random times. Pt c/o pain to right and left upper abd. Pt states his IBS is acting up and feels like gas. Denies vomiting. Diarrhea 8 times today per pt. A/o. Color wnl. Mm wet.

## 2022-07-15 NOTE — ED Provider Notes (Signed)
[facility] Emergency Department Provider Note MRN:  [id number]  Arrival date & time: 07/16/22     Chief Complaint   Nausea and Abdominal Pain   History of Present Illness   Steven [name] is a 71 y.o. year-old male with a history of A-fib, IBS presenting to the ED with chief complaint of abdominal pain.  Persistent upper abdominal pain for the past day or 2, associated with nausea.  Also having continued chronic vertigo, wearing a vertigo patch behind his right ear, gets worse with movement of the head, has been an issue for him for years.  Review of Systems  A thorough review of systems was obtained and all systems are negative except as noted in the HPI and PMH.   Patient's Health History    Past Medical History:  Diagnosis Date   Anxiety    Arthritis    Atrial fibrillation (HCC)    Atrial flutter (HCC)    BPH (benign prostatic hyperplasia)    Chest pain    Dizziness    Dyspnea    laying down occ   Fracture 08/17/2015   MULTIPLE RIB FRACTURES     FROM FALL    GERD (gastroesophageal reflux disease)    Hemorrhoids    History of kidney stones    noted on CT scan   History of radiation therapy 08/22/11-10/13/11   prostate   Hyperlipemia    Hypertension    Hyperthyroidism    IBS (irritable bowel syndrome)    Insomnia    Light headedness    Migraine    Numbness and tingling in left arm    Numbness and tingling of both legs    Open fracture of left elbow 08/18/2015   Prostate cancer [facility])    prostate s/p radiation Mar 2013   Rib fractures 08/17/2015   Tinnitus     Past Surgical History:  Procedure Laterality Date   BOTOX INJECTION N/A 01/14/2020   Procedure: INJECTION OF BOTOX INTO ANAL SPHINCTER;  Surgeon: Ileana Roup, MD;  [facility];  Service: General;  Laterality: N/A;   COLONOSCOPY N/A 12/19/2012   KVQ:[id number] 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 Normal  appearing colon   COLONOSCOPY WITH PROPOFOL N/A 10/23/2019   Procedure: COLONOSCOPY WITH PROPOFOL;  Surgeon: Daneil Dolin, MD; external and grade 2 internal hemorrhoids, abnormal rectal blood vessels consistent with radiation proctitis s/p APC therapy, otherwise normal exam.   EVALUATION UNDER ANESTHESIA WITH ANAL FISTULECTOMY N/A 01/14/2020   Procedure: ANORECTAL EXAM UNDER ANESTHESIA;  Surgeon: Ileana Roup, MD;  Location: [facility];  Service: General;  Laterality: N/A;   HOT HEMOSTASIS  10/23/2019   Procedure: HOT HEMOSTASIS (ARGON PLASMA COAGULATION/BICAP);  Surgeon: Daneil Dolin, MD;  [facility];  Service: Endoscopy;;  apc rectal proctitis     KIDNEY SURGERY  1982   kidney tube collapse repair   RADIOACTIVE SEED IMPLANT     Prostate    Family History  Problem Relation Age of Onset   Aneurysm Mother        aortic   Hypertension Mother    Headache Mother    Prostate cancer Father    Hypertension Father    Colon cancer Neg Hx    Colon polyps Neg Hx     Social History   Socioeconomic History   Marital status: Single    Spouse name: Not on file   Number of children: 2  Years of education: Not on file   Highest education level: 12th grade  Occupational History   Occupation: Custodian     Employer: [employer]  Tobacco Use   Smoking status: Former    Packs/day: 1.00    Years: 20.00    Total pack years: 20.00    Types: Cigarettes    Quit date: 2020    Years since quitting: 3.9   Smokeless tobacco: Never   Tobacco comments:    Quit smoking x 2 years    07/2015   SOMETIIMES i USE VAPOR   Vaping Use   Vaping Use: Never used  Substance and Sexual Activity   Alcohol use: Never   Drug use: Never   Sexual activity: Not Currently  Other Topics Concern   Not on file  Social History Narrative   Lives with friend   Divorced   No caffeine   Social Determinants of Health   Financial Resource Strain: Low Risk  (11/03/2020)   Overall Financial  Resource Strain (CARDIA)    Difficulty of Paying Living Expenses: Not hard at all  Food Insecurity: No Food Insecurity (11/03/2020)   Hunger Vital Sign    Worried About Running Out of Food in the Last Year: Never true    [place] in the Last Year: Never true  Transportation Needs: No Transportation Needs (11/03/2020)   PRAPARE - Hydrologist (Medical): No    Lack of Transportation (Non-Medical): No  Physical Activity: Inactive (11/03/2020)   Exercise Vital Sign    Days of Exercise per Week: 0 days    Minutes of Exercise per Session: 0 min  Stress: No Stress Concern Present (11/03/2020)   [place]    Feeling of Stress : Not at all  Social Connections: Socially Isolated (11/03/2020)   Social Connection and Isolation Panel [NHANES]    Frequency of Communication with Friends and Family: Twice a week    Frequency of Social Gatherings with Friends and Family: Once a week    Attends Religious Services: Never    Marine scientist or Organizations: No    Attends Archivist Meetings: Never    Marital Status: Divorced  Human resources officer Violence: Not At Risk (11/03/2020)   Humiliation, Afraid, Rape, and Kick questionnaire    Fear of Current or Ex-Partner: No    Emotionally Abused: No    Physically Abused: No    Sexually Abused: No     Physical Exam   Vitals:   07/15/22 2300 07/16/22 0048  BP: (!) 145/78 138/75  Pulse: 60 60  Resp: 17 16  Temp:    SpO2: 99% 97%    CONSTITUTIONAL: Well-appearing, NAD NEURO/PSYCH:  Alert and oriented x 3, no focal deficits EYES:  eyes equal and reactive ENT/NECK:  no LAD, no JVD CARDIO: Regular rate, well-perfused, normal S1 and S2 PULM:  CTAB no wheezing or rhonchi GI/GU:  non-distended, non-tender MSK/SPINE:  No gross deformities, no edema SKIN:  no rash, atraumatic   *Additional and/or pertinent findings included in MDM  below  Diagnostic and Interventional Summary    EKG Interpretation  Date/Time:  Saturday July 15 2022 23:36:50 EST Ventricular Rate:  61 PR Interval:  168 QRS Duration: 91 QT Interval:  411 QTC Calculation: 414 R Axis:   -10 Text Interpretation: Sinus rhythm Low voltage, precordial leads Confirmed by Gerlene Fee [phone]) on 07/16/2022 12:25:18 AM  Labs Reviewed  COMPREHENSIVE METABOLIC PANEL - Abnormal; Notable for the following components:      Result Value   Glucose, Bld 118 (*)    Alkaline Phosphatase 36 (*)    All other components within normal limits  CBC - Abnormal; Notable for the following components:   HCT 38.4 (*)    All other components within normal limits  LIPASE, BLOOD  URINALYSIS, ROUTINE W REFLEX MICROSCOPIC    CT ABDOMEN PELVIS W CONTRAST  Final Result      Medications  sodium chloride 0.9 % bolus 500 mL (0 mLs Intravenous Stopped 07/16/22 0026)  ondansetron (ZOFRAN) injection 4 mg (4 mg Intravenous Given 07/15/22 2340)  iohexol (OMNIPAQUE) 300 MG/ML solution 100 mL (100 mLs Intravenous Contrast Given 07/15/22 2342)     Procedures  /  Critical Care Procedures  ED Course and Medical Decision Making  Initial Impression and Ddx Differential diagnosis includes gastritis, pancreatitis, cholecystitis, biliary colic, less likely perforated viscus, awaiting labs, CT.  Past medical/surgical history that increases complexity of ED encounter: Chronic vertigo  Interpretation of Diagnostics I personally reviewed the EKG and my interpretation is as follows: Sinus rhythm without concerning ischemic features  Labs reassuring with no significant blood count or electrolyte disturbance.  Abdomen revealing gallstones but no signs of cholecystitis.  Patient Reassessment and Ultimate Disposition/Management     Patient does not really have any right upper abdominal tenderness.  Favoring IBS versus less likely biliary colic as cause of his pain.  He has a  number of chronic concerns that he sees specialist for.  His vertigo is clearly related to head position based on history, no need for evaluation for central vertigo.  No emergent process, appropriate for discharge.  Patient management required discussion with the following services or consulting groups:  None  Complexity of Problems Addressed Acute illness or injury that poses threat of life of bodily function  Additional Data Reviewed and Analyzed Further history obtained from: Further history from spouse/family member  Additional Factors Impacting ED Encounter Risk None  Steven [name], [place] [email or web address]  Final Clinical Impressions(s) / ED Diagnoses     ICD-10-CM   1. Abdominal pain, unspecified abdominal location  R10.9     2. Vertigo  R42       ED Discharge Orders     None        Discharge Instructions Discussed with and Provided to Patient:     Discharge Instructions      You were evaluated in the Emergency Department and after careful evaluation, we did not find any emergent condition requiring admission or further testing in the hospital.  Your exam/testing today is overall reassuring.  Recommend continued follow-up with your general surgeon to discuss your gallstones, recommend follow-up with your GI specialist to discuss your symptoms, recommend follow-up with your primary care doctor to discuss your vertigo.  Please return to the Emergency Department if you experience any worsening of your condition.   Thank you for allowing [place] to be a part of your care.       Maudie Flakes, MD 07/16/22 [place]

## 2022-07-16 NOTE — Discharge Instructions (Signed)
You were evaluated in the Emergency Department and after careful evaluation, we did not find any emergent condition requiring admission or further testing in the hospital.  Your exam/testing today is overall reassuring.  Recommend continued follow-up with your general surgeon to discuss your gallstones, recommend follow-up with your GI specialist to discuss your symptoms, recommend follow-up with your primary care doctor to discuss your vertigo.  Please return to the Emergency Department if you experience any worsening of your condition.   Thank you for allowing [place] to be a part of your care.

## 2022-07-19 ENCOUNTER — Other Ambulatory Visit: Payer: Self-pay

## 2022-07-19 ENCOUNTER — Telehealth: Payer: Self-pay | Admitting: *Deleted

## 2022-07-19 ENCOUNTER — Other Ambulatory Visit (HOSPITAL_COMMUNITY): Payer: Self-pay

## 2022-07-19 DIAGNOSIS — M5412 Radiculopathy, cervical region: Secondary | ICD-10-CM | POA: Diagnosis not present

## 2022-07-19 DIAGNOSIS — I4891 Unspecified atrial fibrillation: Secondary | ICD-10-CM | POA: Diagnosis not present

## 2022-07-19 DIAGNOSIS — H8110 Benign paroxysmal vertigo, unspecified ear: Secondary | ICD-10-CM | POA: Diagnosis not present

## 2022-07-19 DIAGNOSIS — I10 Essential (primary) hypertension: Secondary | ICD-10-CM | POA: Diagnosis not present

## 2022-07-19 DIAGNOSIS — M4722 Other spondylosis with radiculopathy, cervical region: Secondary | ICD-10-CM | POA: Diagnosis not present

## 2022-07-19 DIAGNOSIS — K8080 Other cholelithiasis without obstruction: Secondary | ICD-10-CM | POA: Diagnosis not present

## 2022-07-19 DIAGNOSIS — E2749 Other adrenocortical insufficiency: Secondary | ICD-10-CM | POA: Insufficient documentation

## 2022-07-19 NOTE — Telephone Encounter (Signed)
Received VM from patient during holiday break. Before office was able to contact patient to follow up, patient came to office.   Patient reports abdominal pain in the RUQ radiating towards his flank and back, nausea, and hot flashes. Noted that patient was seen in ER on 07/15/2022 for abdominal pain. Noted cholelithiasis on CT from ER remains unchanged. Notes report that patient did not have RUQ tenderness and ER MD favored IBS as cause.   Per ER notes, patient was to follow up with Gen Surgery, GI and PCP for various concerns.   Again advised that Sx are not consistent with biliary colic or cholecystitis. Advised labs and imaging are reassuring. Advised to follow up with GI for IBS Sx.   Patient states that he wants to have gallbladder removed. Again advised that this may not be the cause of his Sx and may not resolve his Sx. Patient wanted to proceed with surgery.  Please advise.

## 2022-07-20 NOTE — Telephone Encounter (Signed)
NEXT APPT With General Surgery Virl Cagey, MD) 08/01/2022 at 9:45 AM

## 2022-07-20 NOTE — Telephone Encounter (Signed)
I will see him back and see what has changed. His main complaints last time was not gallbladder related.

## 2022-07-21 ENCOUNTER — Other Ambulatory Visit: Payer: Self-pay | Admitting: Cardiovascular Disease

## 2022-07-21 ENCOUNTER — Ambulatory Visit: Payer: Medicare Other | Admitting: Cardiovascular Disease

## 2022-07-21 NOTE — Telephone Encounter (Signed)
Received call from patient [phone]~ telephone.   Patient reports that abdominal pain has worsened. States that he had multiple episodes of sharp RLQ pain near his hip during the night of 07/20/2022 and hot flashes. States that pain and hot flashes were precipitated by spasm like feeling. States that pain and hot flashes are not longer followed by need to defecate. Reports that BM's are normal.   Denies fever/ chills except for flashes with abdominal spasms. Denies nausea/ GERD.   Patient did state that he is having more vertigo/ headaches. Patient states that he has been seen in the past by neurology who recommended ENT for inner ear issues. Patient does have appointment with [facility] on 08/07/2022.  Discussed patient care with Venetia Night, NP with [facility]. NP also feels that Sx are combination of GERD/ IBS. NP recommended PPI BID and Miralax daily as was discussed in OV with GI.   Gave patient GI recommendations. Advised to keep scheduled appointment with Dr. Constance Haw.

## 2022-07-23 DIAGNOSIS — I4891 Unspecified atrial fibrillation: Secondary | ICD-10-CM | POA: Diagnosis not present

## 2022-07-23 DIAGNOSIS — I10 Essential (primary) hypertension: Secondary | ICD-10-CM | POA: Diagnosis not present

## 2022-07-26 ENCOUNTER — Encounter (HOSPITAL_COMMUNITY)
Admission: RE | Admit: 2022-07-26 | Discharge: 2022-07-26 | Disposition: A | Discharge status: Home / Self Care | Payer: 59 | Source: Ambulatory Visit | Attending: "Endocrinology | Admitting: "Endocrinology

## 2022-07-26 ENCOUNTER — Encounter (HOSPITAL_COMMUNITY): Payer: Self-pay

## 2022-07-26 VITALS — BP 137/74 | HR 61 | Temp 98.8°F | Resp 16

## 2022-07-26 DIAGNOSIS — E2749 Other adrenocortical insufficiency: Secondary | ICD-10-CM

## 2022-07-26 DIAGNOSIS — E2740 Unspecified adrenocortical insufficiency: Secondary | ICD-10-CM | POA: Insufficient documentation

## 2022-07-26 LAB — ACTH STIMULATION, 3 TIME POINTS
Cortisol, 30 Min: 19.1 ug/dL
Cortisol, 60 Min: 23.7 ug/dL
Cortisol, Base: 9.4 ug/dL

## 2022-07-26 MED ORDER — COSYNTROPIN 0.25 MG IJ SOLR: 0.2500 mg | Freq: Once | INTRAMUSCULAR | Status: DC

## 2022-07-26 MED ORDER — COSYNTROPIN 0.25 MG IJ SOLR
0.2500 mg | Freq: Once | INTRAMUSCULAR | Status: AC
  Administered 2022-07-26: 0.25 mg via INTRAMUSCULAR
  Filled 2022-07-26: qty 0.25

## 2022-07-26 NOTE — Progress Notes (Signed)
Diagnosis: Adrenocortical Insufficiency  Provider:   Loni Beckwith, MD   Procedure: Injection  Cosyntropin, Dose: 0.'25mg'$  , Site: intramuscular, Number of injections: 1  Post Care: Observation period completed  Discharge: Condition: Good, Destination: Home . AVS provided to patient.   Performed by:  Hughie Closs, RN

## 2022-07-28 ENCOUNTER — Telehealth: Payer: Self-pay | Admitting: Cardiovascular Disease

## 2022-07-28 NOTE — Telephone Encounter (Signed)
     Pre-operative Risk Assessment    Patient Name: [name]  DOB: 01/11/1951 MRN: [id number]     Request for Surgical Clearance    Procedure:  Dental Extraction - Amount of Teeth to be Pulled:  1  Date of Surgery:  Clearance 07/31/22                                 Surgeon:  Dr. Marisa Cyphers Surgeon's Group or Practice Name:  [facility] Phone number:  [phone] Fax number:  [phone]   Type of Clearance Requested:   - Medical  - Pharmacy:  Hold Apixaban (Eliquis) defer to cards   Type of Anesthesia:  Local    Additional requests/questions:    Signed, Selinda Orion   07/28/2022, 9:17 AM

## 2022-07-28 NOTE — Telephone Encounter (Signed)
   Patient Name: Steven [name]  DOB: [birth year] MRN: [id number]  Primary Cardiologist: Jenkins Rouge, MD  Chart reviewed as part of pre-operative protocol coverage.   Simple dental extractions (i.e. 1-2 teeth) are considered low risk procedures per guidelines and generally do not require any specific cardiac clearance. It is also generally accepted that for simple extractions and dental cleanings, there is no need to interrupt blood thinner therapy.   SBE prophylaxis is not required for the patient from a cardiac standpoint.  I will route this recommendation to the requesting party via Epic fax function and remove from pre-op pool.  Please call with questions.  Mable Fill, Marissa Nestle, NP 07/28/2022, 10:21 AM

## 2022-07-31 ENCOUNTER — Other Ambulatory Visit: Payer: Self-pay | Admitting: Gastroenterology

## 2022-07-31 ENCOUNTER — Telehealth: Payer: Self-pay | Admitting: *Deleted

## 2022-07-31 DIAGNOSIS — R197 Diarrhea, unspecified: Secondary | ICD-10-CM

## 2022-07-31 NOTE — Telephone Encounter (Signed)
Spoke to pt, he informed me that he is doing a bland diet, because he can't eat. He states he is taking Zofran as needed and does not feel like he needs to go to ED at this time. He would like to wait to do stool studies at this time.

## 2022-07-31 NOTE — Telephone Encounter (Signed)
Pt called office 6 times today. I spoke to him and he informed me that he has had diarrhea for a couple days. He has been nauseated and lightheaded. He states he has no appetite and when he has a bowel movement he gets hot all over and feels like he might pass out. Please advise.

## 2022-08-01 ENCOUNTER — Emergency Department (HOSPITAL_COMMUNITY)
Admission: EM | Admit: 2022-08-01 | Discharge: 2022-08-01 | Disposition: A | Discharge status: Home / Self Care | Payer: 59 | Attending: Student | Admitting: Student

## 2022-08-01 ENCOUNTER — Other Ambulatory Visit: Payer: Self-pay

## 2022-08-01 ENCOUNTER — Ambulatory Visit: Payer: Medicare Other | Admitting: General Surgery

## 2022-08-01 ENCOUNTER — Encounter (HOSPITAL_COMMUNITY): Payer: Self-pay | Admitting: "Endocrinology

## 2022-08-01 ENCOUNTER — Emergency Department (HOSPITAL_COMMUNITY): Payer: 59

## 2022-08-01 DIAGNOSIS — R110 Nausea: Secondary | ICD-10-CM | POA: Diagnosis not present

## 2022-08-01 DIAGNOSIS — E0590 Thyrotoxicosis, unspecified without thyrotoxic crisis or storm: Secondary | ICD-10-CM

## 2022-08-01 DIAGNOSIS — I10 Essential (primary) hypertension: Secondary | ICD-10-CM | POA: Diagnosis not present

## 2022-08-01 DIAGNOSIS — Z87891 Personal history of nicotine dependence: Secondary | ICD-10-CM | POA: Diagnosis not present

## 2022-08-01 DIAGNOSIS — E0500 Thyrotoxicosis with diffuse goiter without thyrotoxic crisis or storm: Secondary | ICD-10-CM | POA: Diagnosis not present

## 2022-08-01 DIAGNOSIS — K627 Radiation proctitis: Secondary | ICD-10-CM | POA: Diagnosis not present

## 2022-08-01 DIAGNOSIS — R0789 Other chest pain: Secondary | ICD-10-CM | POA: Diagnosis not present

## 2022-08-01 DIAGNOSIS — Z8249 Family history of ischemic heart disease and other diseases of the circulatory system: Secondary | ICD-10-CM | POA: Diagnosis not present

## 2022-08-01 DIAGNOSIS — R5381 Other malaise: Secondary | ICD-10-CM | POA: Insufficient documentation

## 2022-08-01 DIAGNOSIS — Z1152 Encounter for screening for COVID-19: Secondary | ICD-10-CM | POA: Diagnosis not present

## 2022-08-01 DIAGNOSIS — R079 Chest pain, unspecified: Secondary | ICD-10-CM | POA: Diagnosis present

## 2022-08-01 DIAGNOSIS — R0602 Shortness of breath: Secondary | ICD-10-CM | POA: Diagnosis not present

## 2022-08-01 DIAGNOSIS — R42 Dizziness and giddiness: Secondary | ICD-10-CM | POA: Insufficient documentation

## 2022-08-01 DIAGNOSIS — I4891 Unspecified atrial fibrillation: Secondary | ICD-10-CM | POA: Insufficient documentation

## 2022-08-01 DIAGNOSIS — Z7901 Long term (current) use of anticoagulants: Secondary | ICD-10-CM | POA: Diagnosis not present

## 2022-08-01 DIAGNOSIS — R002 Palpitations: Secondary | ICD-10-CM | POA: Diagnosis not present

## 2022-08-01 DIAGNOSIS — J9811 Atelectasis: Secondary | ICD-10-CM | POA: Diagnosis not present

## 2022-08-01 LAB — CBC WITH DIFFERENTIAL/PLATELET
Abs Immature Granulocytes: 0.01 10*3/uL (ref 0.00–0.07)
Basophils Absolute: 0 10*3/uL (ref 0.0–0.1)
Basophils Relative: 1 %
Eosinophils Absolute: 0 10*3/uL (ref 0.0–0.5)
Eosinophils Relative: 1 %
HCT: 39.4 % (ref 39.0–52.0)
Hemoglobin: 13.2 g/dL (ref 13.0–17.0)
Immature Granulocytes: 0 %
Lymphocytes Relative: 36 %
Lymphs Abs: 1.3 10*3/uL (ref 0.7–4.0)
MCH: 29.5 pg (ref 26.0–34.0)
MCHC: 33.5 g/dL (ref 30.0–36.0)
MCV: 88.1 fL (ref 80.0–100.0)
Monocytes Absolute: 0.3 10*3/uL (ref 0.1–1.0)
Monocytes Relative: 9 %
Neutro Abs: 1.9 10*3/uL (ref 1.7–7.7)
Neutrophils Relative %: 53 %
Platelets: 237 10*3/uL (ref 150–400)
RBC: 4.47 MIL/uL (ref 4.22–5.81)
RDW: 12.9 % (ref 11.5–15.5)
WBC: 3.6 10*3/uL — ABNORMAL LOW (ref 4.0–10.5)
nRBC: 0 % (ref 0.0–0.2)

## 2022-08-01 LAB — COMPREHENSIVE METABOLIC PANEL
ALT: 27 U/L (ref 0–44)
AST: 22 U/L (ref 15–41)
Albumin: 3.9 g/dL (ref 3.5–5.0)
Alkaline Phosphatase: 42 U/L (ref 38–126)
Anion gap: 13 (ref 5–15)
BUN: 5 mg/dL — ABNORMAL LOW (ref 8–23)
CO2: 21 mmol/L — ABNORMAL LOW (ref 22–32)
Calcium: 9.5 mg/dL (ref 8.9–10.3)
Chloride: 102 mmol/L (ref 98–111)
Creatinine, Ser: 0.84 mg/dL (ref 0.61–1.24)
GFR, Estimated: >60 mL/min (ref >60)
Glucose, Bld: 113 mg/dL — ABNORMAL HIGH (ref 70–99)
Potassium: 3.5 mmol/L (ref 3.5–5.1)
Sodium: 136 mmol/L (ref 135–145)
Total Bilirubin: 0.8 mg/dL (ref 0.3–1.2)
Total Protein: 7.1 g/dL (ref 6.5–8.1)

## 2022-08-01 LAB — RESP PANEL BY RT-PCR (RSV, FLU A&B, COVID)  RVPGX2
Influenza A by PCR: NEGATIVE
Influenza B by PCR: NEGATIVE
Resp Syncytial Virus by PCR: NEGATIVE
SARS Coronavirus 2 by RT PCR: NEGATIVE

## 2022-08-01 LAB — TROPONIN I (HIGH SENSITIVITY)
Troponin I (High Sensitivity): 5 ng/L (ref <18)
Troponin I (High Sensitivity): 7 ng/L (ref <18)

## 2022-08-01 LAB — CORTISOL: Cortisol, Plasma: 12.1 ug/dL

## 2022-08-01 LAB — TSH: TSH: 0.271 u[IU]/mL — ABNORMAL LOW (ref 0.350–4.500)

## 2022-08-01 LAB — BRAIN NATRIURETIC PEPTIDE: B Natriuretic Peptide: 7 pg/mL (ref 0.0–100.0)

## 2022-08-01 LAB — T4, FREE: Free T4: 1.71 ng/dL — ABNORMAL HIGH (ref 0.61–1.12)

## 2022-08-01 LAB — LIPASE, BLOOD: Lipase: 29 U/L (ref 11–51)

## 2022-08-01 MED ORDER — ONDANSETRON 4 MG PO TBDP: 4.0000 mg | ORAL_TABLET | Freq: Three times a day (TID) | ORAL | 0 refills | Status: DC | PRN

## 2022-08-01 MED ORDER — LACTATED RINGERS IV BOLUS
1000.0000 mL | Freq: Once | INTRAVENOUS | Status: AC
  Administered 2022-08-01: 1000 mL via INTRAVENOUS

## 2022-08-01 NOTE — ED Provider Notes (Signed)
[facility] EMERGENCY DEPARTMENT Provider Note  CSN: [id number] Arrival date & time: 08/01/22 2876  Chief Complaint(s) Chest Pain  HPI Steven [name] is a 72 y.o. male with PMH A-fib on Eliquis, HTN, HLD, chronic vertigo, IBS who presents emergency department for evaluation of multiple complaints including chest pain, nausea, diarrhea, hot flashes for approximately 1 week.  He states that have gotten progressively worse and are worse when going from sitting to standing and particularly when using the restroom.  He states that he feels like he is going to pass out.  He has had decreased p.o. intake secondary to persistent nausea and a feeling of early satiety but states that he does hydrate well.  He endorses exertional fatigue but does not have increased work of breathing.   Past Medical History Past Medical History:  Diagnosis Date   Anxiety    Arthritis    Atrial fibrillation (HCC)    Atrial flutter (HCC)    BPH (benign prostatic hyperplasia)    Chest pain    Dizziness    Dyspnea    laying down occ   Fracture 08/17/2015   MULTIPLE RIB FRACTURES     FROM FALL    GERD (gastroesophageal reflux disease)    Hemorrhoids    History of kidney stones    noted on CT scan   History of radiation therapy 08/22/11-10/13/11   prostate   Hyperlipemia    Hypertension    Hyperthyroidism    IBS (irritable bowel syndrome)    Insomnia    Light headedness    Migraine    Numbness and tingling in left arm    Numbness and tingling of both legs    Open fracture of left elbow 08/18/2015   Prostate cancer [facility])    prostate s/p radiation Mar 2013   Rib fractures 08/17/2015   Tinnitus    Patient Active Problem List   Diagnosis Date Noted   Secondary adrenocortical insufficiency ([place]) 07/19/2022   Rash and nonspecific skin eruption 05/10/2022   Dysphagia 05/10/2022   Chronic pain 07/07/2020   Encounter to establish care 07/07/2020   Anxiety 07/07/2020   Carcinoma of prostate ([place]) 02/17/2020    Subacute thyroiditis 01/07/2020   Personal history of malignant neoplasm of prostate 12/12/2019   Benign prostatic hyperplasia with urinary obstruction 12/12/2019   Weak urinary stream 12/12/2019   Hyperthyroidism 11/24/2019   Weight loss 10/21/2019   IBS (irritable bowel syndrome) 10/21/2019   Dizzy spells 09/20/2019   Intractable chronic migraine without aura 09/20/2019   Obstructive sleep apnea syndrome 09/20/2019   Psychophysiologic insomnia 09/20/2019   Rectal pain 08/21/2019   Nausea without vomiting 05/30/2019   Atrial flutter ([place]) 04/17/2019   Chronic atrial fibrillation ([place]) 03/02/2019   HTN (hypertension) 03/02/2019   Constipation 08/02/2017   Hemorrhoids 08/02/2017   Fall 08/18/2015   Radiation proctitis 01/30/2013   Rectal bleeding 12/10/2012   Obese 05/30/2012   Malignant neoplasm of prostate ([place]) 08/13/2011   Home Medication(s) Prior to Admission medications   Medication Sig Start Date End Date Taking? Authorizing Provider  alfuzosin (UROXATRAL) 10 MG 24 hr tablet Take 1 tablet (10 mg total) by mouth daily with breakfast. 04/07/22  Yes [name], Candee Furbish, MD  amLODipine (NORVASC) 10 MG tablet Take 1 tablet by mouth daily. 12/08/20  Yes [provider]  carvedilol (COREG) 6.25 MG tablet TAKE 1 TABLET(6.25 MG) BY MOUTH TWICE DAILY 07/21/22  Yes Josue Hector, MD  Cholecalciferol (VITAMIN D3) 125 MCG (5000 UT) CAPS  Take 1 capsule by mouth daily.    Yes [provider]  clindamycin (CLEOCIN) 300 MG capsule Take 300 mg by mouth every 6 (six) hours. 07/27/22  Yes [provider]  ELIQUIS 5 MG TABS tablet TAKE 1 TABLET(5 MG) BY MOUTH TWICE DAILY Patient taking differently: Take 5 mg by mouth 2 (two) times daily. 04/24/22  Yes Josue Hector, MD  Erenumab-aooe (AIMOVIG) 70 MG/ML SOAJ Inject 1 mL into the skin every 30 (thirty) days.   Yes [provider]  famotidine (PEPCID) 20 MG tablet Take 20 mg by mouth daily.   Yes [provider]  fluticasone (FLONASE) 50 MCG/ACT nasal spray Place 1 spray into both nostrils daily.   Yes [provider]  gabapentin (NEURONTIN) 300 MG capsule Take 300 mg by mouth 3 (three) times daily. 02/24/20  Yes [provider]  hydrochlorothiazide (HYDRODIURIL) 25 MG tablet Take 25 mg by mouth daily. 07/28/19  Yes [provider]  losartan (COZAAR) 100 MG tablet Take 100 mg by mouth daily. 01/05/21  Yes [provider]  NEXLETOL 180 MG TABS Take 180 mg by mouth daily. Pt taking 2-3 times a week 12/03/19  Yes [provider]  ondansetron (ZOFRAN-ODT) 4 MG disintegrating tablet Take 1 tablet (4 mg total) by mouth every 8 (eight) hours as needed for nausea or vomiting. 08/01/22  Yes Amandalynn Pitz, MD  potassium chloride SA (KLOR-CON) 20 MEQ tablet Take 20 mEq by mouth 3 (three) times daily. 12/09/19  Yes [provider]  Probiotic Product (PROBIOTIC-10 PO) Take 1 capsule by mouth daily.   Yes [provider]  promethazine (PHENERGAN) 12.5 MG tablet Take 12.5 mg by mouth every 6 (six) hours as needed for nausea or vomiting.   Yes [provider]  vitamin B-12 (CYANOCOBALAMIN) 100 MCG tablet Take 100 mcg by mouth daily.   Yes [provider]  diazepam (VALIUM) 5 MG tablet Take 1 tablet (5 mg total) by mouth 2 (two) times daily. Patient not taking: Reported on 08/01/2022 06/28/22   Evalee Jefferson, PA-C  dicyclomine (BENTYL) 10 MG capsule Take 10 mg by mouth 4 (four) times daily. Patient not taking: Reported on 08/01/2022 07/19/22   [provider]  omeprazole (PRILOSEC) 20 MG capsule TAKE 1 CAPSULE(20 MG) BY MOUTH TWICE DAILY BEFORE A MEAL Patient not taking: Reported on 08/01/2022 07/05/22   Annitta Needs, NP  penicillin v potassium (VEETID) 500 MG tablet Take 500 mg by mouth 4 (four) times daily. Patient not taking: Reported on 08/01/2022 07/19/22   [provider]  predniSONE (DELTASONE) 10 MG tablet 6, 5, 4, 3,  2 then 1 tablet by mouth daily for 6 days total. Patient not taking: Reported on 08/01/2022 06/28/22   Evalee Jefferson, PA-C  Past Surgical History Past Surgical History:  Procedure Laterality Date   BOTOX INJECTION N/A 01/14/2020   Procedure: INJECTION OF BOTOX INTO ANAL SPHINCTER;  Surgeon: Ileana Roup, MD;  Location: [facility] ORS;  Service: General;  Laterality: N/A;   COLONOSCOPY N/A 12/19/2012   NFA:OZHYQM 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 Normal appearing colon   COLONOSCOPY WITH PROPOFOL N/A 10/23/2019   Procedure: COLONOSCOPY WITH PROPOFOL;  Surgeon: Daneil Dolin, MD; external and grade 2 internal hemorrhoids, abnormal rectal blood vessels consistent with radiation proctitis s/p APC therapy, otherwise normal exam.   EVALUATION UNDER ANESTHESIA WITH ANAL FISTULECTOMY N/A 01/14/2020   Procedure: ANORECTAL EXAM UNDER ANESTHESIA;  Surgeon: Ileana Roup, MD;  Location: [facility] ORS;  Service: General;  Laterality: N/A;   HOT HEMOSTASIS  10/23/2019   Procedure: HOT HEMOSTASIS (ARGON PLASMA COAGULATION/BICAP);  Surgeon: Daneil Dolin, MD;  Location: [facility];  Service: Endoscopy;;  apc rectal proctitis     KIDNEY SURGERY  1982   kidney tube collapse repair   RADIOACTIVE SEED IMPLANT     Prostate   Family History Family History  Problem Relation Age of Onset   Aneurysm Mother        aortic   Hypertension Mother    Headache Mother    Prostate cancer Father    Hypertension Father    Colon cancer Neg Hx    Colon polyps Neg Hx     Social History Social History   Tobacco Use   Smoking status: Former    Packs/day: 1.00    Years: 20.00    Total pack years: 20.00    Types: Cigarettes    Quit date: 2020    Years since quitting: 4.0   Smokeless tobacco: Never   Tobacco comments:     Quit smoking x 2 years    07/2015   SOMETIIMES i USE VAPOR   Vaping Use   Vaping Use: Never used  Substance Use Topics   Alcohol use: Never   Drug use: Never   Allergies Latex and Meclizine  Review of Systems Review of Systems  Constitutional:  Positive for fatigue.  Cardiovascular:  Positive for chest pain.  Gastrointestinal:  Positive for nausea and vomiting.    Physical Exam Vital Signs  I have reviewed the triage vital signs BP 134/71   Pulse 76   Temp 98.6 F (37 C) (Oral)   Resp 15   Ht '5\' 7"'$  (1.702 m)   Wt 90.7 kg   SpO2 100%   BMI 31.32 kg/m   Physical Exam Constitutional:      General: He is not in acute distress.    Appearance: Normal appearance.  HENT:     Head: Normocephalic and atraumatic.     Nose: No congestion or rhinorrhea.  Eyes:     General:        Right eye: No discharge.        Left eye: No discharge.     Extraocular Movements: Extraocular movements intact.     Pupils: Pupils are equal, round, and reactive to light.  Cardiovascular:     Rate and Rhythm: Normal rate and regular rhythm.     Heart sounds: No murmur heard. Pulmonary:     Effort: No respiratory distress.     Breath sounds: No wheezing or rales.  Abdominal:     General: There is no distension.     Tenderness: There is no abdominal tenderness.  Musculoskeletal:        General: Normal range of motion.     Cervical back: Normal range of motion.  Skin:    General: Skin is warm and dry.  Neurological:     General: No focal deficit present.     Mental Status: He is alert.     ED Results and Treatments Labs (all labs ordered are listed, but only abnormal results are displayed) Labs Reviewed  COMPREHENSIVE METABOLIC PANEL - Abnormal; Notable for the following components:      Result Value   CO2 21 (*)    Glucose, Bld 113 (*)    BUN 5 (*)    All other components within normal limits  CBC WITH DIFFERENTIAL/PLATELET - Abnormal; Notable for the following components:   WBC  3.6 (*)    All other components within normal limits  TSH - Abnormal; Notable for the following components:   TSH 0.271 (*)    All other components within normal limits  T4, FREE - Abnormal; Notable for the following components:   Free T4 1.71 (*)    All other components within normal limits  RESP PANEL BY RT-PCR (RSV, FLU A&B, COVID)  RVPGX2  BRAIN NATRIURETIC PEPTIDE  LIPASE, BLOOD  CORTISOL  T3, FREE  TROPONIN I (HIGH SENSITIVITY)  TROPONIN I (HIGH SENSITIVITY)                                                                                                                          Radiology DG Chest 2 View  Result Date: 08/01/2022 CLINICAL DATA:  Dyspnea, chest tightness, shortness of breath and palpitations. EXAM: CHEST - 2 VIEW COMPARISON:  PA Lat 06/28/2022 FINDINGS: The cardiomediastinal silhouette and vascular pattern are normal. Eventrations are again noted along both hemidiaphragms and scattered linear atelectasis in the bases. The lungs are clear of infiltrates. The sulci are sharp. There is osteopenia, mild thoracic kyphosis, and multilevel thoracic bridging enthesopathy. IMPRESSION: 1. No evidence of acute chest disease.  Stable chest. 2. Osteopenia and multilevel thoracic bridging enthesopathy. Electronically Signed   By: Telford Nab M.D.   On: 08/01/2022 07:43    Pertinent labs & imaging results that were available during my care of the patient were reviewed by me and considered in my medical decision making (see MDM for details).  Medications Ordered in ED Medications  lactated ringers bolus 1,000 mL (0 mLs Intravenous Stopped 08/01/22 1217)  Procedures Procedures  (including critical care time)  Medical Decision Making / ED Course   This patient presents to the ED for concern of nausea, weight loss, hot sweats, chest pain, this  involves an extensive number of treatment options, and is a complaint that carries with it a high risk of complications and morbidity.  The differential diagnosis includes electrolyte abnormality, ACS, pneumonia, hypothyroidism, adrenal insufficiency, pheochromocytoma  MDM: Patient seen emergency room for evaluation of multiple complaints described above.  Physical exam largely unremarkable.  Laboratory evaluation with a leukopenia to 3.6, CO2 21, high-sensitivity troponin and BNP unremarkable, chest x-ray unremarkable.  Patient low risk by Wells criteria and I have very low suspicion for PE.  COVID, flu, RSV negative.  TSH is low at 0.271 and I did reach out to the patient's primary endocrinologist Dr. Dorris Fetch over epic chat who assisted me and interpreting the patient's ACTH stim test and was found that this was reassuringly normal.  It appears the patient has follow-up with his endocrinologist tomorrow and his endocrinologist is recommending T3, T4 and cortisol levels which were drawn prior to patient discharge.  Patient was fluid resuscitated and given Zofran which did lead to improvement of his symptoms.  Patient appears to have a very similar presentation in the past when he was found to have subacute thyroiditis and I suspect she is likely suffering from an element of hyperthyroidism today.  Given that he has very close follow-up tomorrow with endocrinology we will not start medications like methimazole at this time and leave this to his outpatient providers.  He does not meet inpatient criteria for admission at this time and is safe for discharge with outpatient endocrinology follow-up with strict return precautions which he voiced understanding.  In regards the patient's chest pain, with negative troponins and no exertional component to his pain I have very low suspicion for ACS.    Additional history obtained: -Additional history obtained from wife -External records from outside source obtained and  reviewed including: Chart review including previous notes, labs, imaging, consultation notes   Lab Tests: -I ordered, reviewed, and interpreted labs.   The pertinent results include:   Labs Reviewed  COMPREHENSIVE METABOLIC PANEL - Abnormal; Notable for the following components:      Result Value   CO2 21 (*)    Glucose, Bld 113 (*)    BUN 5 (*)    All other components within normal limits  CBC WITH DIFFERENTIAL/PLATELET - Abnormal; Notable for the following components:   WBC 3.6 (*)    All other components within normal limits  TSH - Abnormal; Notable for the following components:   TSH 0.271 (*)    All other components within normal limits  T4, FREE - Abnormal; Notable for the following components:   Free T4 1.71 (*)    All other components within normal limits  RESP PANEL BY RT-PCR (RSV, FLU A&B, COVID)  RVPGX2  BRAIN NATRIURETIC PEPTIDE  LIPASE, BLOOD  CORTISOL  T3, FREE  TROPONIN I (HIGH SENSITIVITY)  TROPONIN I (HIGH SENSITIVITY)     Imaging Studies ordered: I ordered imaging studies including chest x-ray I independently visualized and interpreted imaging. I agree with the radiologist interpretation   Medicines ordered and prescription drug management: Meds ordered this encounter  Medications   lactated ringers bolus 1,000 mL   ondansetron (ZOFRAN-ODT) 4 MG disintegrating tablet    Sig: Take 1 tablet (4 mg total) by mouth every 8 (eight) hours as needed for nausea or vomiting.  Dispense:  20 tablet    Refill:  0    -I have reviewed the patients home medicines and have made adjustments as needed  Critical interventions None  Consultations Obtained: I requested consultation with the patient's primary endocrinologist,  and discussed lab and imaging findings as well as pertinent plan - they recommend: T3, T4 and cortisol levels   Cardiac Monitoring: The patient was maintained on a cardiac monitor.  I personally viewed and interpreted the cardiac monitored  which showed an underlying rhythm of: NSR, no ST elevations or depressions  Social Determinants of Health:  Factors impacting patients care include: none   Reevaluation: After the interventions noted above, I reevaluated the patient and found that they have :improved  Co morbidities that complicate the patient evaluation  Past Medical History:  Diagnosis Date   Anxiety    Arthritis    Atrial fibrillation (HCC)    Atrial flutter (HCC)    BPH (benign prostatic hyperplasia)    Chest pain    Dizziness    Dyspnea    laying down occ   Fracture 08/17/2015   MULTIPLE RIB FRACTURES     FROM FALL    GERD (gastroesophageal reflux disease)    Hemorrhoids    History of kidney stones    noted on CT scan   History of radiation therapy 08/22/11-10/13/11   prostate   Hyperlipemia    Hypertension    Hyperthyroidism    IBS (irritable bowel syndrome)    Insomnia    Light headedness    Migraine    Numbness and tingling in left arm    Numbness and tingling of both legs    Open fracture of left elbow 08/18/2015   Prostate cancer [facility])    prostate s/p radiation Mar 2013   Rib fractures 08/17/2015   Tinnitus       Dispostion: I considered admission for this patient, but at this time he does not meet inpatient criteria for admission and he is safe for discharge with outpatient endocrinology follow-up tomorrow.     Final Clinical Impression(s) / ED Diagnoses Final diagnoses:  Malaise  Nausea  Hyperthyroidism     '@PCDICTATION'$ @    Teressa Lower, MD 08/01/22 [place]

## 2022-08-01 NOTE — ED Triage Notes (Signed)
Pt c/o chest pain, nausea, diarrhea, and hot sweats x 1 week.

## 2022-08-02 ENCOUNTER — Ambulatory Visit (INDEPENDENT_AMBULATORY_CARE_PROVIDER_SITE_OTHER): Payer: 59 | Admitting: "Endocrinology

## 2022-08-02 ENCOUNTER — Encounter (HOSPITAL_COMMUNITY): Payer: Self-pay | Admitting: "Endocrinology

## 2022-08-02 ENCOUNTER — Encounter: Payer: Self-pay | Admitting: "Endocrinology

## 2022-08-02 VITALS — BP 132/66 | HR 96 | Ht 67.0 in | Wt 197.6 lb

## 2022-08-02 DIAGNOSIS — E0590 Thyrotoxicosis, unspecified without thyrotoxic crisis or storm: Secondary | ICD-10-CM | POA: Diagnosis not present

## 2022-08-02 LAB — T3, FREE: T3, Free: 3.5 pg/mL (ref 2.0–4.4)

## 2022-08-02 MED ORDER — METHIMAZOLE 5 MG PO TABS: 5.0000 mg | ORAL_TABLET | Freq: Every day | ORAL | 3 refills | Status: DC

## 2022-08-02 NOTE — Progress Notes (Signed)
08/02/2022     Endocrinology follow-up note   Subjective:    Patient ID: Steven [name], male    DOB: 1951/03/31, PCP Redmond School, MD.   Past Medical History:  Diagnosis Date   Anxiety    Arthritis    Atrial fibrillation [facility])    Atrial flutter ([place])    BPH (benign prostatic hyperplasia)    Chest pain    Dizziness    Dyspnea    laying down occ   Fracture 08/17/2015   MULTIPLE RIB FRACTURES     FROM FALL    GERD (gastroesophageal reflux disease)    Hemorrhoids    History of kidney stones    noted on CT scan   History of radiation therapy 08/22/11-10/13/11   prostate   Hyperlipemia    Hypertension    Hyperthyroidism    IBS (irritable bowel syndrome)    Insomnia    Light headedness    Migraine    Numbness and tingling in left arm    Numbness and tingling of both legs    Open fracture of left elbow 08/18/2015   Prostate cancer [facility])    prostate s/p radiation Mar 2013   Rib fractures 08/17/2015   Tinnitus     Past Surgical History:  Procedure Laterality Date   BOTOX INJECTION N/A 01/14/2020   Procedure: INJECTION OF BOTOX INTO ANAL SPHINCTER;  Surgeon: Ileana Roup, MD;  Location: [facility];  Service: General;  Laterality: N/A;   COLONOSCOPY N/A 12/19/2012   [id number] 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 Normal appearing colon   COLONOSCOPY WITH PROPOFOL N/A 10/23/2019   Procedure: COLONOSCOPY WITH PROPOFOL;  Surgeon: Daneil Dolin, MD; external and grade 2 internal hemorrhoids, abnormal rectal blood vessels consistent with radiation proctitis s/p APC therapy, otherwise normal exam.   EVALUATION UNDER ANESTHESIA WITH ANAL FISTULECTOMY N/A 01/14/2020   Procedure: ANORECTAL EXAM UNDER ANESTHESIA;  Surgeon: Ileana Roup, MD;  [facility];  Service: General;  Laterality: N/A;   HOT HEMOSTASIS  10/23/2019   Procedure: HOT HEMOSTASIS (ARGON PLASMA COAGULATION/BICAP);  Surgeon:  Daneil Dolin, MD;  [facility];  Service: Endoscopy;;  apc rectal proctitis     KIDNEY SURGERY  1982   kidney tube collapse repair   RADIOACTIVE SEED IMPLANT     Prostate    Social History   Socioeconomic History   Marital status: Single    Spouse name: Not on file   Number of children: 2   Years of education: Not on file   Highest education level: 12th grade  Occupational History   Occupation: Custodian     Employer: [employer]  Tobacco Use   Smoking status: Former    Packs/day: 1.00    Years: 20.00    Total pack years: 20.00    Types: Cigarettes    Quit date: 2020    Years since quitting: 4.0   Smokeless tobacco: Never   Tobacco comments:    Quit smoking x 2 years    07/2015   SOMETIIMES i USE VAPOR   Vaping Use   Vaping Use: Never used  Substance and Sexual Activity   Alcohol use: Never   Drug use: Never   Sexual activity: Not Currently  Other Topics Concern   Not on file  Social History Narrative   Lives with friend   Divorced   No caffeine   Social Determinants of Health  Financial Resource Strain: Low Risk  (11/03/2020)   Overall Financial Resource Strain (CARDIA)    Difficulty of Paying Living Expenses: Not hard at all  Food Insecurity: No Food Insecurity (11/03/2020)   Hunger Vital Sign    Worried About Running Out of Food in the Last Year: Never true    Ran Out of Food in the Last Year: Never true  Transportation Needs: No Transportation Needs (11/03/2020)   PRAPARE - Hydrologist (Medical): No    Lack of Transportation (Non-Medical): No  Physical Activity: Inactive (11/03/2020)   Exercise Vital Sign    Days of Exercise per Week: 0 days    Minutes of Exercise per Session: 0 min  Stress: No Stress Concern Present (11/03/2020)   [place]    Feeling of Stress : Not at all  Social Connections: Socially Isolated (11/03/2020)   Social  Connection and Isolation Panel [NHANES]    Frequency of Communication with Friends and Family: Twice a week    Frequency of Social Gatherings with Friends and Family: Once a week    Attends Religious Services: Never    Marine scientist or Organizations: No    Attends Music therapist: Never    Marital Status: Divorced    Family History  Problem Relation Age of Onset   Aneurysm Mother        aortic   Hypertension Mother    Headache Mother    Prostate cancer Father    Hypertension Father    Colon cancer Neg Hx    Colon polyps Neg Hx     Outpatient Encounter Medications as of 08/02/2022  Medication Sig   methimazole (TAPAZOLE) 5 MG tablet Take 1 tablet (5 mg total) by mouth daily.   alfuzosin (UROXATRAL) 10 MG 24 hr tablet Take 1 tablet (10 mg total) by mouth daily with breakfast.   amLODipine (NORVASC) 10 MG tablet Take 1 tablet by mouth daily.   carvedilol (COREG) 6.25 MG tablet TAKE 1 TABLET(6.25 MG) BY MOUTH TWICE DAILY   Cholecalciferol (VITAMIN D3) 125 MCG (5000 UT) CAPS Take 1 capsule by mouth daily.    clindamycin (CLEOCIN) 300 MG capsule Take 300 mg by mouth every 6 (six) hours.   ELIQUIS 5 MG TABS tablet TAKE 1 TABLET(5 MG) BY MOUTH TWICE DAILY (Patient taking differently: Take 5 mg by mouth 2 (two) times daily.)   Erenumab-aooe (AIMOVIG) 70 MG/ML SOAJ Inject 1 mL into the skin every 30 (thirty) days.   famotidine (PEPCID) 20 MG tablet Take 20 mg by mouth daily.   fluticasone (FLONASE) 50 MCG/ACT nasal spray Place 1 spray into both nostrils daily.   gabapentin (NEURONTIN) 300 MG capsule Take 300 mg by mouth 3 (three) times daily.   hydrochlorothiazide (HYDRODIURIL) 25 MG tablet Take 25 mg by mouth daily.   losartan (COZAAR) 100 MG tablet Take 100 mg by mouth daily.   NEXLETOL 180 MG TABS Take 180 mg by mouth daily. Pt taking 2-3 times a week   ondansetron (ZOFRAN-ODT) 4 MG disintegrating tablet Take 1 tablet (4 mg total) by mouth every 8 (eight) hours as  needed for nausea or vomiting.   penicillin v potassium (VEETID) 500 MG tablet Take 500 mg by mouth 4 (four) times daily. (Patient not taking: Reported on 08/01/2022)   potassium chloride SA (KLOR-CON) 20 MEQ tablet Take 20 mEq by mouth 3 (three) times daily.   Probiotic Product (PROBIOTIC-10 PO)  Take 1 capsule by mouth daily.   promethazine (PHENERGAN) 12.5 MG tablet Take 12.5 mg by mouth every 6 (six) hours as needed for nausea or vomiting.   vitamin B-12 (CYANOCOBALAMIN) 100 MCG tablet Take 100 mcg by mouth daily.   [DISCONTINUED] diazepam (VALIUM) 5 MG tablet Take 1 tablet (5 mg total) by mouth 2 (two) times daily. (Patient not taking: Reported on 08/01/2022)   [DISCONTINUED] dicyclomine (BENTYL) 10 MG capsule Take 10 mg by mouth 4 (four) times daily. (Patient not taking: Reported on 08/01/2022)   [DISCONTINUED] omeprazole (PRILOSEC) 20 MG capsule TAKE 1 CAPSULE(20 MG) BY MOUTH TWICE DAILY BEFORE A MEAL (Patient not taking: Reported on 08/01/2022)   [DISCONTINUED] predniSONE (DELTASONE) 10 MG tablet 6, 5, 4, 3, 2 then 1 tablet by mouth daily for 6 days total. (Patient not taking: Reported on 08/01/2022)   No facility-administered encounter medications on file as of 08/02/2022.    ALLERGIES: Allergies  Allergen Reactions   Latex Rash    Possible reaction to latex gloves per patient   Meclizine     Other reaction(s): Abdominal Pain, Other    VACCINATION STATUS: Immunization History  Administered Date(s) Administered   Influenza,inj,quad, With Preservative 04/03/2019   Influenza-Unspecified 05/26/2020   Moderna Sars-Covid-2 Vaccination 09/07/2019, 10/12/2019     HPI  CANON GOLA is 72 y.o. male who was previously seen in 2021 for subclinical hypothyroidism and mild hypocortisolemia.  His subsequent workup did not reveal adrenal dysfunction.  Recently, he was dealing with nausea, vomiting, and lower loss of appetite.  He had to visit ER twice.  His basic cardiac workup was negative.  His  repeat thyroid function tests show evidence of thyrotoxicosis.  He is not on antithyroid intervention at this time.   He claims to have been losing weight, however his weight is actually higher than it was during his last visit.  There is an increase from 186 during his prior visit to 197 pounds during this visit.  he was previously, briefly treated with methimazole for transient hyperthyroidism secondary to subacute thyroiditis.     he denies dysphagia, choking, shortness of breath, no recent voice change.    he denies  family history of thyroid dysfunction. He  denies family hx of thyroid cancer. he denies personal history of goiter.                           Review of systems  Limited as above.   Objective:    BP 132/66   Pulse 96   Ht '5\' 7"'$  (1.702 m)   Wt 197 lb 9.6 oz (89.6 kg)   BMI 30.95 kg/m   Wt Readings from Last 3 Encounters:  08/02/22 197 lb 9.6 oz (89.6 kg)  08/01/22 200 lb (90.7 kg)  07/13/22 206 lb (93.4 kg)       CMP     Component Value Date/Time   NA 136 08/01/2022 0827   NA 141 11/10/2019 1051   K 3.5 08/01/2022 0827   CL 102 08/01/2022 0827   CO2 21 (L) 08/01/2022 0827   GLUCOSE 113 (H) 08/01/2022 0827   BUN 5 (L) 08/01/2022 0827   BUN 8 04/16/2020 0000   CREATININE 0.84 08/01/2022 0827   CALCIUM 9.5 08/01/2022 0827   PROT 7.1 08/01/2022 0827   ALBUMIN 3.9 08/01/2022 0827   AST 22 08/01/2022 0827   ALT 27 08/01/2022 0827   ALKPHOS 42 08/01/2022 0827   BILITOT 0.8 08/01/2022  0827   GFRNONAA >60 08/01/2022 0827   GFRAA >60 02/19/2020 1622     CBC    Component Value Date/Time   WBC 3.6 (L) 08/01/2022 0827   RBC 4.47 08/01/2022 0827   HGB 13.2 08/01/2022 0827   HCT 39.4 08/01/2022 0827   PLT 237 08/01/2022 0827   MCV 88.1 08/01/2022 0827   MCH 29.5 08/01/2022 0827   MCHC 33.5 08/01/2022 0827   RDW 12.9 08/01/2022 0827   LYMPHSABS 1.3 08/01/2022 0827   MONOABS 0.3 08/01/2022 0827   EOSABS 0.0 08/01/2022 0827   BASOSABS 0.0 08/01/2022  0827   Lipid Panel     Component Value Date/Time   CHOL 142 04/16/2020 0000   TRIG 74 04/16/2020 0000   HDL 48 04/16/2020 0000   CHOLHDL 4.4 03/02/2019 0329   VLDL 18 03/02/2019 0329   LDLCALC 79 04/16/2020 0000     Lab Results  Component Value Date   TSH 0.271 (L) 08/01/2022   TSH 0.415 07/14/2021   TSH 3.630 07/14/2020   TSH 2.899 07/08/2020   TSH 2.276 05/14/2020   TSH 5.20 04/16/2020   TSH 0.301 (L) 01/01/2020   TSH 0.282 (L) 10/22/2019   TSH 0.843 03/02/2019   FREET4 1.71 (H) 08/01/2022   FREET4 1.19 07/14/2020   FREET4 1.37 01/01/2020   FREET4 1.37 (H) 10/22/2019    Thyroid uptake and scan on Nov 28, 2019 FINDINGS: There is fairly homogeneous thyroid uptake. The left lobe extends more inferiorly than the right, although no focal hot or cold nodules are identified.   4 hour I-123 uptake = 4.1% (normal 5-20%)  24 hour I-123 uptake = 12.9% (normal 10-30%)   IMPRESSION: 1. Low normal radioactive iodine uptake by the thyroid gland. 2. No focal hot or cold nodules identified.    Assessment & Plan:   1. Hyperthyroidism- His previsit labs are consistent with mild hyperthyroidism.  He has GI symptoms and weakness.  His presenting symptoms are out of proportion for the thyroid dysfunction he has.  It is likely that he has an alternative diagnosis for his GI complaints.  I have advised him to seek ER visit if he continues to see worsening of his nausea, vomiting, or if he develops chest pain.    Ideally, he would need thyroid uptake and scan to assess the nature of his thyroid dysfunction.  However, in the interest of control of his thyroid within a reasonable time, I discussed and initiated low-dose methimazole for him.  I discussed and prescribed methimazole 5 mg p.o. daily at breakfast with plan to repeat labs and office visit in 3 months.   His recent adrenal assessment is indicated for sufficient adrenal response including random cortisol and ACTH stimulation test  ruling out adrenal insufficiency.  He is known to have cholelithiasis, abdominal exam today is negative for cholecystitis.  -Patient is advised to maintain close follow up with Redmond School, MD for primary care needs.  I spent 21 minutes in the care of the patient today including review of labs from Thyroid Function, CMP, and other relevant labs ; imaging/biopsy records (current and previous including abstractions from other facilities); face-to-face time discussing  his lab results and symptoms, medications doses, his options of short and long term treatment based on the latest standards of care / guidelines;   and documenting the encounter.  Steven [name]  participated in the discussions, expressed understanding, and voiced agreement with the above plans.  All questions were answered to his satisfaction. he  is encouraged to contact clinic should he have any questions or concerns prior to his return visit.    Follow up plan: Return in about 3 months (around 11/01/2022) for Fasting Labs  in AM B4 8.   Thank you for involving me in the care of this pleasant patient, and I will continue to update you with his progress.  Glade Lloyd, MD [facility] Phone: [phone]  Fax: [phone]   08/02/2022, 7:43 PM  This note was partially dictated with voice recognition software. Similar sounding words can be transcribed inadequately or may not  be corrected upon review.

## 2022-08-03 ENCOUNTER — Encounter: Payer: Self-pay | Admitting: Gastroenterology

## 2022-08-03 NOTE — Progress Notes (Deleted)
[facility]  PATIENT: Steven [name] DOB: 1952-08-71  REFERRING CLINICIAN: Redmond School, MD HISTORY FROM: *** REASON FOR VISIT: dizziness   HISTORICAL  CHIEF COMPLAINT:  No chief complaint on file.   HISTORY OF PRESENT ILLNESS:  The patient has a history of dizziness since he was diagnosed with afib in 2020.*** Has tried meclizine which was not helpful and caused stomach upset. He was recommended to see ENT***He has had multiple brain MRIs which have been unremarkable***. He was recently found to be hyperthyroid and was started on methimazole last week***  He has a history of cervicogenic headache and migraine. Takes Cymbalta 20 mg daily, gabapentin 300 mg TID,and Aimovig for migraine prevention. He takes Games developer for rescue.  MRI brain 71/10/23 showed no acute process.  MRI C-spine 06/2020 showed severe foraminal narrowing at C3-4 and C6-7. He follows with NSGY for cervical stenosis.  OTHER MEDICAL CONDITIONS: afib, HTN, OSA, hyperthyroidism, IBS    REVIEW OF SYSTEMS: Full 14 system review of systems performed and negative with exception of: ***  ALLERGIES: Allergies  Allergen Reactions   Latex Rash    Possible reaction to latex gloves per patient   Meclizine     Other reaction(s): Abdominal Pain, Other    HOME MEDICATIONS: Outpatient Medications Prior to Visit  Medication Sig Dispense Refill   alfuzosin (UROXATRAL) 10 MG 24 hr tablet Take 1 tablet (10 mg total) by mouth daily with breakfast. 90 tablet 3   amLODipine (NORVASC) 10 MG tablet Take 1 tablet by mouth daily.     carvedilol (COREG) 6.25 MG tablet TAKE 1 TABLET(6.25 MG) BY MOUTH TWICE DAILY 180 tablet 2   Cholecalciferol (VITAMIN D3) 125 MCG (5000 UT) CAPS Take 1 capsule by mouth daily.      clindamycin (CLEOCIN) 300 MG capsule Take 300 mg by mouth every 6 (six) hours.     ELIQUIS 5 MG TABS tablet TAKE 1 TABLET(5 MG) BY MOUTH TWICE DAILY (Patient taking differently: Take 5 mg by mouth  2 (two) times daily.) 60 tablet 5   Erenumab-aooe (AIMOVIG) 70 MG/ML SOAJ Inject 1 mL into the skin every 30 (thirty) days.     famotidine (PEPCID) 20 MG tablet Take 20 mg by mouth daily.     fluticasone (FLONASE) 50 MCG/ACT nasal spray Place 1 spray into both nostrils daily.     gabapentin (NEURONTIN) 300 MG capsule Take 300 mg by mouth 3 (three) times daily.     hydrochlorothiazide (HYDRODIURIL) 25 MG tablet Take 25 mg by mouth daily.     losartan (COZAAR) 100 MG tablet Take 100 mg by mouth daily.     methimazole (TAPAZOLE) 5 MG tablet Take 1 tablet (5 mg total) by mouth daily. 30 tablet 3   NEXLETOL 180 MG TABS Take 180 mg by mouth daily. Pt taking 2-3 times a week     ondansetron (ZOFRAN-ODT) 4 MG disintegrating tablet Take 1 tablet (4 mg total) by mouth every 8 (eight) hours as needed for nausea or vomiting. 20 tablet 0   penicillin v potassium (VEETID) 500 MG tablet Take 500 mg by mouth 4 (four) times daily. (Patient not taking: Reported on 08/01/2022)     potassium chloride SA (KLOR-CON) 20 MEQ tablet Take 20 mEq by mouth 3 (three) times daily.     Probiotic Product (PROBIOTIC-10 PO) Take 1 capsule by mouth daily.     promethazine (PHENERGAN) 12.5 MG tablet Take 12.5 mg by mouth every 6 (six) hours as needed for nausea or  vomiting.     vitamin B-12 (CYANOCOBALAMIN) 100 MCG tablet Take 100 mcg by mouth daily.     No facility-administered medications prior to visit.    PAST MEDICAL HISTORY: Past Medical History:  Diagnosis Date   Anxiety    Arthritis    Atrial fibrillation ([facility])    Atrial flutter ([facility])    BPH (benign prostatic hyperplasia)    Chest pain    Dizziness    Dyspnea    laying down occ   Fracture 08/17/2015   MULTIPLE RIB FRACTURES     FROM FALL    GERD (gastroesophageal reflux disease)    Hemorrhoids    History of kidney stones    noted on CT scan   History of radiation therapy 08/22/11-10/13/11   prostate   Hyperlipemia    Hypertension    Hyperthyroidism    IBS  (irritable bowel syndrome)    Insomnia    Light headedness    Migraine    Numbness and tingling in left arm    Numbness and tingling of both legs    Open fracture of left elbow 08/18/2015   Prostate cancer [facility])    prostate s/p radiation Mar 2013   Rib fractures 08/17/2015   Tinnitus     PAST SURGICAL HISTORY: Past Surgical History:  Procedure Laterality Date   BOTOX INJECTION N/A 01/14/2020   Procedure: INJECTION OF BOTOX INTO ANAL SPHINCTER;  Surgeon: Ileana Roup, MD;  Location[facility];  Service: General;  Laterality: N/A;   COLONOSCOPY N/A 12/19/2012   [id number] 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 Normal appearing colon   COLONOSCOPY WITH PROPOFOL N/A 10/23/2019   Procedure: COLONOSCOPY WITH PROPOFOL;  Surgeon: Daneil Dolin, MD; external and grade 2 internal hemorrhoids, abnormal rectal blood vessels consistent with radiation proctitis s/p APC therapy, otherwise normal exam.   EVALUATION UNDER ANESTHESIA WITH ANAL FISTULECTOMY N/A 01/14/2020   Procedure: ANORECTAL EXAM UNDER ANESTHESIA;  Surgeon: Ileana Roup, MD;  Location[facility];  Service: General;  Laterality: N/A;   HOT HEMOSTASIS  10/23/2019   Procedure: HOT HEMOSTASIS (ARGON PLASMA COAGULATION/BICAP);  Surgeon: Daneil Dolin, MD;  [facility];  Service: Endoscopy;;  apc rectal proctitis     KIDNEY SURGERY  1982   kidney tube collapse repair   RADIOACTIVE SEED IMPLANT     Prostate    FAMILY HISTORY: Family History  Problem Relation Age of Onset   Aneurysm Mother        aortic   Hypertension Mother    Headache Mother    Prostate cancer Father    Hypertension Father    Colon cancer Neg Hx    Colon polyps Neg Hx     SOCIAL HISTORY: Social History   Socioeconomic History   Marital status: Single    Spouse name: Not on file   Number of children: 2   Years of education: Not on file   Highest education level: 12th grade   Occupational History   Occupation: Custodian     Employer: [employer]  Tobacco Use   Smoking status: Former    Packs/day: 1.00    Years: 20.00    Total pack years: 20.00    Types: Cigarettes    Quit date: 2020    Years since quitting: 4.0   Smokeless tobacco: Never   Tobacco comments:    Quit smoking x 2 years    07/2015   SOMETIIMES  i USE VAPOR   Vaping Use   Vaping Use: Never used  Substance and Sexual Activity   Alcohol use: Never   Drug use: Never   Sexual activity: Not Currently  Other Topics Concern   Not on file  Social History Narrative   Lives with friend   Divorced   No caffeine   Social Determinants of Health   Financial Resource Strain: Low Risk  (11/03/2020)   Overall Financial Resource Strain (CARDIA)    Difficulty of Paying Living Expenses: Not hard at all  Food Insecurity: No Food Insecurity (11/03/2020)   Hunger Vital Sign    Worried About Running Out of Food in the Last Year: Never true    Ran Out of Food in the Last Year: Never true  Transportation Needs: No Transportation Needs (11/03/2020)   PRAPARE - Hydrologist (Medical): No    Lack of Transportation (Non-Medical): No  Physical Activity: Inactive (11/03/2020)   Exercise Vital Sign    Days of Exercise per Week: 0 days    Minutes of Exercise per Session: 0 min  Stress: No Stress Concern Present (11/03/2020)   [place]    Feeling of Stress : Not at all  Social Connections: Socially Isolated (11/03/2020)   Social Connection and Isolation Panel [NHANES]    Frequency of Communication with Friends and Family: Twice a week    Frequency of Social Gatherings with Friends and Family: Once a week    Attends Religious Services: Never    Marine scientist or Organizations: No    Attends Archivist Meetings: Never    Marital Status: Divorced  Human resources officer Violence: Not At Risk  (11/03/2020)   Humiliation, Afraid, Rape, and Kick questionnaire    Fear of Current or Ex-Partner: No    Emotionally Abused: No    Physically Abused: No    Sexually Abused: No     PHYSICAL EXAM ***  GENERAL EXAM/CONSTITUTIONAL: Vitals: There were no vitals filed for this visit. There is no height or weight on file to calculate BMI. Wt Readings from Last 3 Encounters:  08/02/22 197 lb 9.6 oz (89.6 kg)  08/01/22 200 lb (90.7 kg)  07/13/22 206 lb (93.4 kg)   Patient is in no distress; well developed, nourished and groomed; neck is supple  CARDIOVASCULAR: Examination of carotid arteries is normal; no carotid bruits Regular rate and rhythm, no murmurs Examination of peripheral vascular system by observation and palpation is normal  EYES: Pupils round and reactive to light, Visual fields full to confrontation, Extraocular movements intacts,   MUSCULOSKELETAL: Gait, strength, tone, movements noted in Neurologic exam below  NEUROLOGIC: MENTAL STATUS:      No data to display         awake, alert, oriented to person, place and time recent and remote memory intact normal attention and concentration language fluent, comprehension intact, naming intact fund of knowledge appropriate  CRANIAL NERVE:  2nd - no papilledema or hemorrhages on fundoscopic exam 2nd, 3rd, 4th, 6th - pupils equal and reactive to light, visual fields full to confrontation, extraocular muscles intact, no nystagmus 5th - facial sensation symmetric 7th - facial strength symmetric 8th - hearing intact 9th - palate elevates symmetrically, uvula midline 11th - shoulder shrug symmetric 12th - tongue protrusion midline  MOTOR:  normal bulk and tone, full strength in the BUE, BLE  SENSORY:  normal and symmetric to light touch, pinprick, temperature,  vibration  COORDINATION:  finger-nose-finger, fine finger movements normal  REFLEXES:  deep tendon reflexes present and symmetric  GAIT/STATION:   normal     DIAGNOSTIC DATA (LABS, IMAGING, TESTING) - I reviewed patient records, labs, notes, testing and imaging myself where available.  Lab Results  Component Value Date   WBC 3.6 (L) 08/01/2022   HGB 13.2 08/01/2022   HCT 39.4 08/01/2022   MCV 88.1 08/01/2022   PLT 237 08/01/2022      Component Value Date/Time   NA 136 08/01/2022 0827   NA 141 11/10/2019 1051   K 3.5 08/01/2022 0827   CL 102 08/01/2022 0827   CO2 21 (L) 08/01/2022 0827   GLUCOSE 113 (H) 08/01/2022 0827   BUN 5 (L) 08/01/2022 0827   BUN 8 04/16/2020 0000   CREATININE 0.84 08/01/2022 0827   CALCIUM 9.5 08/01/2022 0827   PROT 7.1 08/01/2022 0827   ALBUMIN 3.9 08/01/2022 0827   AST 22 08/01/2022 0827   ALT 27 08/01/2022 0827   ALKPHOS 42 08/01/2022 0827   BILITOT 0.8 08/01/2022 0827   GFRNONAA >60 08/01/2022 0827   GFRAA >60 02/19/2020 1622   Lab Results  Component Value Date   CHOL 142 04/16/2020   HDL 48 04/16/2020   LDLCALC 79 04/16/2020   TRIG 74 04/16/2020   CHOLHDL 4.4 03/02/2019   No results found for: "HGBA1C" No results found for: "VITAMINB12" Lab Results  Component Value Date   TSH 0.271 (L) 08/01/2022    ***    ASSESSMENT AND PLAN  72 y.o. year old male with ***   No diagnosis found.    PLAN:   No orders of the defined types were placed in this encounter.   No orders of the defined types were placed in this encounter.   No follow-ups on file.    Genia Harold, MD  I spent an average of *** chart reviewing and counseling the patient, with at least 50% of the time face to face with the patient.   [facility] [address], [place], [place] [address] [phone]

## 2022-08-04 NOTE — Telephone Encounter (Signed)
Noted  

## 2022-08-07 ENCOUNTER — Institutional Professional Consult (permissible substitution): Payer: Medicare Other | Admitting: Psychiatry

## 2022-08-07 ENCOUNTER — Encounter: Payer: Self-pay | Admitting: Psychiatry

## 2022-08-08 ENCOUNTER — Telehealth: Payer: Self-pay

## 2022-08-08 ENCOUNTER — Ambulatory Visit: Payer: 59 | Admitting: General Surgery

## 2022-08-08 NOTE — Telephone Encounter (Signed)
Pt had LMOVM to call but call was transferred to [facility]. Pt returned call again and he was advised that his message was sent to the providers nurse who last seen him and they would contact him.

## 2022-08-09 ENCOUNTER — Ambulatory Visit (INDEPENDENT_AMBULATORY_CARE_PROVIDER_SITE_OTHER): Payer: 59 | Admitting: General Surgery

## 2022-08-09 ENCOUNTER — Telehealth: Payer: Self-pay | Admitting: *Deleted

## 2022-08-09 ENCOUNTER — Ambulatory Visit: Payer: Medicare Other | Admitting: Gastroenterology

## 2022-08-09 ENCOUNTER — Encounter: Payer: Self-pay | Admitting: General Surgery

## 2022-08-09 VITALS — BP 146/82 | HR 84 | Temp 98.3°F | Resp 16 | Ht 67.0 in | Wt 194.0 lb

## 2022-08-09 DIAGNOSIS — K8020 Calculus of gallbladder without cholecystitis without obstruction: Secondary | ICD-10-CM

## 2022-08-09 DIAGNOSIS — R110 Nausea: Secondary | ICD-10-CM | POA: Diagnosis not present

## 2022-08-09 NOTE — Telephone Encounter (Signed)
Routing to [place] who last saw patient.

## 2022-08-09 NOTE — Patient Instructions (Addendum)
Think about the option of surgery.  It may help some with the nausea but may not improve this symptom any. The other symptoms may not change at all.  Call once you decide.   Minimally Invasive Cholecystectomy Minimally invasive cholecystectomy is surgery to remove the gallbladder. The gallbladder is a pear-shaped organ that lies beneath the liver on the right side of the body. The gallbladder stores bile, which is a fluid that helps the body digest fats. Cholecystectomy is often done to treat inflammation (irritation and swelling) of the gallbladder (cholecystitis). This condition is usually caused by a buildup of gallstones (cholelithiasis) in the gallbladder or when the fluid in the gall bladder becomes stagnant because gallstones get stuck in the ducts (tubes) and block the flow of bile. This can result in inflammation and pain. In severe cases, emergency surgery may be required. This procedure is done through small incisions in the abdomen, instead of one large incision. It is also called laparoscopic surgery. A thin scope with a camera (laparoscope) is inserted through one incision. Then surgical instruments are inserted through the other incisions. In some cases, a minimally invasive surgery may need to be changed to a surgery that is done through a larger incision. This is called open surgery. Tell a health care provider about: Any allergies you have. All medicines you are taking, including vitamins, herbs, eye drops, creams, and over-the-counter medicines. Any problems you or family members have had with anesthetic medicines. Any bleeding problems you have. Any surgeries you have had. Any medical conditions you have. Whether you are pregnant or may be pregnant. What are the risks? Generally, this is a safe procedure. However, problems may occur, including: Infection. Bleeding. Allergic reactions to medicines. Damage to nearby structures or organs. A gallstone remaining in the common  bile duct. The common bile duct carries bile from the gallbladder to the small intestine. A bile leak from the liver or cystic duct after your gallbladder is removed. What happens before the procedure?  Medicines Ask your health care provider about: Changing or stopping your regular medicines. This is especially important if you are taking diabetes medicines or blood thinners. Taking medicines such as aspirin and ibuprofen. These medicines can thin your blood. Do not take these medicines unless your health care provider tells you to take them. Taking over-the-counter medicines, vitamins, herbs, and supplements. General instructions If you will be going home right after the procedure, plan to have a responsible adult: Take you home from the hospital or clinic. You will not be allowed to drive. Care for you for the time you are told. Do not use any products that contain nicotine or tobacco for at least 4 weeks before the procedure. These products include cigarettes, chewing tobacco, and vaping devices, such as e-cigarettes. If you need help quitting, ask your health care provider. Ask your health care provider: How your surgery site will be marked. What steps will be taken to help prevent infection. These may include: Removing hair at the surgery site. Washing skin with a germ-killing soap. Taking antibiotic medicine. What happens during the procedure?  An IV will be inserted into one of your veins. You will be given one or both of the following: A medicine to help you relax (sedative). A medicine to make you fall asleep (general anesthetic). Your surgeon will make several small incisions in your abdomen. The laparoscope will be inserted through one of the small incisions. The camera on the laparoscope will send images to a monitor in  the operating room. This lets your surgeon see inside your abdomen. A gas will be pumped into your abdomen. This will expand your abdomen to give the surgeon  more room to perform the surgery. Other tools that are needed for the procedure will be inserted through the other incisions. The gallbladder will be removed through one of the incisions. Your common bile duct may be examined. If stones are found in the common bile duct, they may be removed. After your gallbladder has been removed, the incisions will be closed with stitches (sutures), staples, or skin glue. Your incisions will be covered with a bandage (dressing). The procedure may vary among health care providers and hospitals. What happens after the procedure? Your blood pressure, heart rate, breathing rate, and blood oxygen level will be monitored until you leave the hospital or clinic. You will be given medicines as needed to control your pain. You may have a drain placed in the incision. The drain will be removed a day or two after the procedure. Summary Minimally invasive cholecystectomy, also called laparoscopic cholecystectomy, is surgery to remove the gallbladder using small incisions. Tell your health care provider about all the medical conditions you have and all the medicines you are taking for those conditions. Before the procedure, follow instructions about when to stop eating and drinking and changing or stopping medicines. Plan to have a responsible adult care for you for the time you are told after you leave the hospital or clinic. This information is not intended to replace advice given to you by your health care provider. Make sure you discuss any questions you have with your health care provider. Document Revised: 01/11/2021 Document Reviewed: 01/11/2021 Elsevier Patient Education  [place].

## 2022-08-09 NOTE — Telephone Encounter (Signed)
Pt called and states he is still sick to his stomach. He states he is weak, losing weight and can't eat. He has a hot flash right before he has a bowel movement and feel sick. Also feels lightheaded. He states he has a lot of gas and bloating. He feels like he can't pass gas. Gas get so bad it scares him. He thinks he maybe having a heart attack. He is constipated. He takes milk of magnesium, prune juice and MiraLax. That makes him have a bowel movement. But he goes to much and then he has to take Kaopectate and this makes him feel backed up.  Please advise

## 2022-08-09 NOTE — Progress Notes (Signed)
[facility] History and Physical    Chief Complaint   Follow-up     Steven [name] is a 72 y.o. male.  HPI: Steven [name] is a 72 yo who has had multiple issues lately and multiple visits to Steven ED. Steven [name] was originally referred to me with concern for gallstones and during that visit his complaints ranged from dizziness, headaches, nausea, chest pain, decreased appetite but no real concrete gallbladder type symptoms. Steven [name] had has IBS at baseline and issues with constipation and diarrhea related to his IBS. Steven [name] already followed with GI regarding these issues.   Since I saw him in December, Steven [name] has been seen by multiple providers including:   GI 07/13/22- Steven Night, NP- They discussed his IBS and nausea being possibly from his vertigo/ tinnitus.  ED 07/15/22- Dr. Sedonia [name] -where Steven [name] was worked up for chest pain and had a CT that demonstrated gallstone again; but they felt like his IBS was likely contributing to his abdominal issues as Steven [name] has no RUQ tenderness.   ED 08/01/22 Dr. Matilde [name]- where Steven [name] was seen for his plethora of symptoms including nausea, chest pain, diarrhea, and hot flashes, which was similar to Steven 12/23 visit.  Steven [name] was worked up and found to be hyperthyroid and sent to Endocrinology. There was no suspicion of gallbladder issues documented.   Endocrinology 08/02/22 Dr. Dorris [name]- where Steven [name] was started on methimazole with plans to follow up in 3 months. Dr. Dorris [name] felt that his GI symptoms were more than mild hyperthyroidism would cause but did not make any remarks as to other etiology other than Steven [name] having gallstones.  Today Steven [name] comes in with his longterm partner. Today his complaints are nausea, no vomiting, poor appetite, some weight loss since November at about 15 lbs reported. Steven [name] says Steven [name] cannot sleep, Steven [name] has headaches, gas and stomach aches in Steven lower abdomen, constipation. Steven [name] is not complaining of RUQ pain specific to food or nausea/vomiting specific to   food. His nausea is chronic.   Steven [name] has a large midline incision from some sort of kidney/ ureter surgery done in 1982.    Past Medical History:  Diagnosis Date   Anxiety    Arthritis    Atrial fibrillation (HCC)    Atrial flutter (HCC)    BPH (benign prostatic hyperplasia)    Chest pain    Dizziness    Dyspnea    laying down occ   Fracture 08/17/2015   MULTIPLE RIB FRACTURES     FROM FALL    GERD (gastroesophageal reflux disease)    Hemorrhoids    History of kidney stones    noted on CT scan   History of radiation therapy 08/22/11-10/13/11   prostate   Hyperlipemia    Hypertension    Hyperthyroidism    IBS (irritable bowel syndrome)    Insomnia    Light headedness    Migraine    Numbness and tingling in left arm    Numbness and tingling of both legs    Open fracture of left elbow 08/18/2015   Prostate cancer [facility])    prostate s/p radiation Mar 2013   Rib fractures 08/17/2015   Tinnitus     Past Surgical History:  Procedure Laterality Date   BOTOX INJECTION N/A 01/14/2020   Procedure: INJECTION OF BOTOX INTO ANAL SPHINCTER;  Surgeon: Ileana Roup, MD;  Location: [facility] ORS;  Service: General;  Laterality: N/A;   COLONOSCOPY N/A 12/19/2012   FVC:BSWHQP 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 Normal appearing colon   COLONOSCOPY WITH PROPOFOL N/A 10/23/2019   Procedure: COLONOSCOPY WITH PROPOFOL;  Surgeon: Daneil Dolin, MD; external and grade 2 internal hemorrhoids, abnormal rectal blood vessels consistent with radiation proctitis s/p APC therapy, otherwise normal exam.   EVALUATION UNDER ANESTHESIA WITH ANAL FISTULECTOMY N/A 01/14/2020   Procedure: ANORECTAL EXAM UNDER ANESTHESIA;  Surgeon: Ileana Roup, MD;  Location: [facility] ORS;  Service: General;  Laterality: N/A;   HOT HEMOSTASIS  10/23/2019   Procedure: HOT HEMOSTASIS (ARGON PLASMA COAGULATION/BICAP);  Surgeon: Daneil Dolin, MD;  Location: [facility]  SU[facility];  Service: Endoscopy;;  apc rectal proctitis     KIDNEY SURGERY  1982   kidney tube collapse repair   RADIOACTIVE SEED IMPLANT     Prostate    Family History  Problem Relation Age of Onset   Aneurysm Mother        aortic   Hypertension Mother    Headache Mother    Prostate cancer Father    Hypertension Father    Colon cancer Neg Hx    Colon polyps Neg Hx     Social History   Tobacco Use   Smoking status: Former    Packs/day: 1.00    Years: 20.00    Total pack years: 20.00    Types: Cigarettes    Quit date: 2020    Years since quitting: 4.0   Smokeless tobacco: Never   Tobacco comments:    Quit smoking x 2 years    07/2015   SOMETIIMES i USE VAPOR   Vaping Use   Vaping Use: Never used  Substance Use Topics   Alcohol use: Never   Drug use: Never    Medications: I have reviewed Steven [name]'s current medications. Allergies as of 08/09/2022       Reactions   Latex Rash   Possible reaction to latex gloves per [name]   Meclizine    Other reaction(s): Abdominal Pain, Other        Medication List        Accurate as of August 09, 2022 11:07 AM. If you have any questions, ask your nurse or doctor.          Aimovig 70 MG/ML Soaj Generic drug: Erenumab-aooe Inject 1 mL into Steven skin every 30 (thirty) days.   alfuzosin 10 MG 24 hr tablet Commonly known as: UROXATRAL Take 1 tablet (10 mg total) by mouth daily with breakfast.   amLODipine 10 MG tablet Commonly known as: NORVASC Take 1 tablet by mouth daily.   carvedilol 6.25 MG tablet Commonly known as: COREG TAKE 1 TABLET(6.25 MG) BY MOUTH TWICE DAILY   clindamycin 300 MG capsule Commonly known as: CLEOCIN Take 300 mg by mouth every 6 (six) hours.   Eliquis 5 MG Tabs tablet Generic drug: apixaban TAKE 1 TABLET(5 MG) BY MOUTH TWICE DAILY What changed: See Steven new instructions.   famotidine 20 MG tablet Commonly known as: PEPCID Take 20 mg by mouth daily.   fluticasone 50 MCG/ACT nasal  spray Commonly known as: FLONASE Place 1 spray into both nostrils daily.   gabapentin 300 MG capsule Commonly known as: NEURONTIN Take 300 mg by mouth 3 (three) times daily.   hydrochlorothiazide 25 MG tablet Commonly known as: HYDRODIURIL Take 25 mg by mouth daily.   losartan 100 MG tablet Commonly known as: COZAAR Take 100 mg by mouth daily.   methimazole 5 MG tablet  Commonly known as: TAPAZOLE Take 1 tablet (5 mg total) by mouth daily.   Nexletol 180 MG Tabs Generic drug: Bempedoic Acid Take 180 mg by mouth daily. Pt taking 2-3 times a week   ondansetron 4 MG disintegrating tablet Commonly known as: ZOFRAN-ODT Take 1 tablet (4 mg total) by mouth every 8 (eight) hours as needed for nausea or vomiting.   penicillin v potassium 500 MG tablet Commonly known as: VEETID Take 500 mg by mouth 4 (four) times daily.   potassium chloride SA 20 MEQ tablet Commonly known as: KLOR-CON M Take 20 mEq by mouth 3 (three) times daily.   PROBIOTIC-10 PO Take 1 capsule by mouth daily.   promethazine 12.5 MG tablet Commonly known as: PHENERGAN Take 12.5 mg by mouth every 6 (six) hours as needed for nausea or vomiting.   vitamin B-12 100 MCG tablet Commonly known as: CYANOCOBALAMIN Take 100 mcg by mouth daily.   Vitamin D3 125 MCG (5000 UT) Caps Take 1 capsule by mouth daily.         ROS:  Pertinent items are noted in HPI.  Blood pressure (!) 146/82, pulse 84, temperature 98.3 F (36.8 C), temperature source Oral, resp. rate 16, height '5\' 7"'$  (1.702 m), weight 194 lb (88 kg), SpO2 93 %. Physical Exam Vitals reviewed.  HENT:     Head: Normocephalic.     Nose: Nose normal.  Eyes:     Extraocular Movements: Extraocular movements intact.  Cardiovascular:     Rate and Rhythm: Normal rate and regular rhythm.  Pulmonary:     Effort: Pulmonary effort is normal.     Breath sounds: Normal breath sounds.  Abdominal:     General: There is no distension.     Palpations:  Abdomen is soft.     Tenderness: There is no abdominal tenderness.     Hernia: No hernia is present.     Comments: Large midline incision, mostly below Steven umbilicus  Musculoskeletal:        General: Normal range of motion.     Cervical back: Normal range of motion.  Skin:    General: Skin is warm.  Neurological:     General: No focal deficit present.     Mental Status: Steven [name] is alert and oriented to person, place, and time.  Psychiatric:        Mood and Affect: Mood normal.        Behavior: Behavior normal.        Thought Content: Thought content normal.        Judgment: Judgment normal.     Results: Personally reviewed- stones but no other concerning issues, showed Steven [name]  CLINICAL DATA:  Upper abdominal pain and nausea for 3 weeks. History of gallstones and IBS.   EXAM: CT ABDOMEN AND PELVIS WITH CONTRAST   TECHNIQUE: Multidetector CT imaging of Steven abdomen and pelvis was performed using Steven standard protocol following bolus administration of intravenous contrast.   RADIATION DOSE REDUCTION: This exam was performed according to Steven departmental dose-optimization program which includes automated exposure control, adjustment of the mA and/or kV according to [name] size and/or use of iterative reconstruction technique.   CONTRAST:  140m OMNIPAQUE IOHEXOL 300 MG/ML  SOLN   COMPARISON:  07/08/2020.   FINDINGS: Lower chest: Heart is mildly enlarged and there is a trace pericardial effusion. Mild atelectasis is present at Steven left lung base.   Hepatobiliary: Stable scattered subcentimeter hypodensities are present in Steven liver, statistically most likely cysts  or hemangiomas. No biliary ductal dilatation. Stones are present within Steven gallbladder. No obvious gallbladder wall thickening.   Pancreas: Unremarkable. No pancreatic ductal dilatation or surrounding inflammatory changes.   Spleen: Normal in size without focal abnormality.   Adrenals/Urinary Tract: No  adrenal nodule or mass. Steven kidneys enhance symmetrically. A nonobstructive calculus is noted on Steven right. There is mild fullness of Steven distal left ureter on Steven left with implantation on Steven left lateral aspect of Steven urinary bladder, unchanged from Steven prior exam. No bladder wall thickening.   Stomach/Bowel: Stomach is within normal limits. Appendix appears normal. No evidence of bowel wall thickening, distention, or inflammatory changes. No free air or pneumatosis.   Vascular/Lymphatic: Aortic atherosclerosis. No enlarged abdominal or pelvic lymph nodes.   Reproductive: Prostate gland is normal in size. A few metallic densities are noted, possible fiducial markers are brachytherapy seeds.   Other: No abdominopelvic ascites. There are bilateral fat containing inguinal hernias.   Musculoskeletal: Degenerative changes in Steven thoracolumbar spine. No acute or suspicious osseous abnormality.   IMPRESSION: 1. No acute intra-abdominal process. 2. Cholelithiasis. 3. Stable scattered hypodensities in Steven liver, most likely cysts or hemangiomas. 4. Nonobstructive right renal calculus. 5. Aortic atherosclerosis.     Electronically Signed   By: Brett Fairy M.D.   On: 07/16/2022 00:18    Assessment & Plan:  KELIN BORUM is a 72 y.o. male with multiple complaints and issues going on with vague complaints of nausea, hot flashes, poor appetite, constipation but Steven [name] has IBS at baseline, is currently mildly hyperthyroid, and has multiple reasons Steven [name] could have nausea.  I have discussed Steven gallbladder and  gallbladder symptoms with him and his partner at length. We have discussed that this is not Steven classic symptom of gallbladder disease.   Given this persistence and concern, I have offered to remove his gallbladder but warned him that this could have no benefit with his symptoms and put his at risk for issues with anesthesia and complications of surgery without any change in his nausea.  I have discussed that I do not know if Steven cholecystectomy will help Steven nausea. Steven [name] also has a real risk of an open surgery due to his large midline scar from his renal/ ureter  surgery in 1982.   I counseled Steven [name] about Steven indication, risks and benefits of robotic assisted laparoscopic cholecystectomy.  Steven [name] understands there is a very [name] chance for bleeding, infection, injury to normal structures (including common bile duct), conversion to open surgery, persistent symptoms, evolution of postcholecystectomy diarrhea, need for secondary interventions, anesthesia reaction, cardiopulmonary issues and other risks not specifically detailed here. I described Steven expected recovery, Steven plan for follow-up and Steven restrictions during Steven recovery phase.  All questions were answered.   Steven [name] is going to think about Steven options and discuss this with his partner.   They will call once they decide what route Steven [name] wants to go.      Virl Cagey 08/09/2022, 11:07 AM

## 2022-08-10 ENCOUNTER — Other Ambulatory Visit: Payer: Self-pay

## 2022-08-10 ENCOUNTER — Emergency Department (HOSPITAL_COMMUNITY): Payer: 59

## 2022-08-10 ENCOUNTER — Encounter (HOSPITAL_COMMUNITY): Payer: Self-pay | Admitting: *Deleted

## 2022-08-10 ENCOUNTER — Emergency Department (HOSPITAL_COMMUNITY)
Admission: EM | Admit: 2022-08-10 | Discharge: 2022-08-10 | Disposition: A | Discharge status: Home / Self Care | Payer: 59 | Attending: Emergency Medicine | Admitting: Emergency Medicine

## 2022-08-10 ENCOUNTER — Telehealth: Payer: Self-pay | Admitting: Cardiovascular Disease

## 2022-08-10 DIAGNOSIS — Z79899 Other long term (current) drug therapy: Secondary | ICD-10-CM | POA: Diagnosis not present

## 2022-08-10 DIAGNOSIS — R0602 Shortness of breath: Secondary | ICD-10-CM | POA: Insufficient documentation

## 2022-08-10 DIAGNOSIS — Z91040 Latex allergy status: Secondary | ICD-10-CM | POA: Insufficient documentation

## 2022-08-10 DIAGNOSIS — I482 Chronic atrial fibrillation, unspecified: Secondary | ICD-10-CM

## 2022-08-10 DIAGNOSIS — Z1152 Encounter for screening for COVID-19: Secondary | ICD-10-CM | POA: Diagnosis not present

## 2022-08-10 DIAGNOSIS — R55 Syncope and collapse: Secondary | ICD-10-CM

## 2022-08-10 DIAGNOSIS — E876 Hypokalemia: Secondary | ICD-10-CM | POA: Diagnosis not present

## 2022-08-10 DIAGNOSIS — Z7901 Long term (current) use of anticoagulants: Secondary | ICD-10-CM | POA: Diagnosis not present

## 2022-08-10 DIAGNOSIS — I499 Cardiac arrhythmia, unspecified: Secondary | ICD-10-CM | POA: Diagnosis not present

## 2022-08-10 DIAGNOSIS — I10 Essential (primary) hypertension: Secondary | ICD-10-CM | POA: Diagnosis not present

## 2022-08-10 DIAGNOSIS — Z8546 Personal history of malignant neoplasm of prostate: Secondary | ICD-10-CM | POA: Insufficient documentation

## 2022-08-10 DIAGNOSIS — Z743 Need for continuous supervision: Secondary | ICD-10-CM | POA: Diagnosis not present

## 2022-08-10 DIAGNOSIS — R42 Dizziness and giddiness: Secondary | ICD-10-CM | POA: Diagnosis not present

## 2022-08-10 DIAGNOSIS — J811 Chronic pulmonary edema: Secondary | ICD-10-CM | POA: Diagnosis not present

## 2022-08-10 DIAGNOSIS — I471 Supraventricular tachycardia, unspecified: Secondary | ICD-10-CM

## 2022-08-10 DIAGNOSIS — R404 Transient alteration of awareness: Secondary | ICD-10-CM | POA: Diagnosis not present

## 2022-08-10 DIAGNOSIS — K627 Radiation proctitis: Secondary | ICD-10-CM | POA: Diagnosis not present

## 2022-08-10 LAB — BASIC METABOLIC PANEL
Anion gap: 11 (ref 5–15)
BUN: <5 mg/dL — ABNORMAL LOW (ref 8–23)
CO2: 26 mmol/L (ref 22–32)
Calcium: 9.4 mg/dL (ref 8.9–10.3)
Chloride: 101 mmol/L (ref 98–111)
Creatinine, Ser: 0.9 mg/dL (ref 0.61–1.24)
GFR, Estimated: >60 mL/min (ref >60)
Glucose, Bld: 107 mg/dL — ABNORMAL HIGH (ref 70–99)
Potassium: 2.8 mmol/L — ABNORMAL LOW (ref 3.5–5.1)
Sodium: 138 mmol/L (ref 135–145)

## 2022-08-10 LAB — CBC WITH DIFFERENTIAL/PLATELET
Abs Immature Granulocytes: 0.01 10*3/uL (ref 0.00–0.07)
Basophils Absolute: 0 10*3/uL (ref 0.0–0.1)
Basophils Relative: 1 %
Eosinophils Absolute: 0.1 10*3/uL (ref 0.0–0.5)
Eosinophils Relative: 2 %
HCT: 37.8 % — ABNORMAL LOW (ref 39.0–52.0)
Hemoglobin: 12.7 g/dL — ABNORMAL LOW (ref 13.0–17.0)
Immature Granulocytes: 0 %
Lymphocytes Relative: 36 %
Lymphs Abs: 1.2 10*3/uL (ref 0.7–4.0)
MCH: 29.7 pg (ref 26.0–34.0)
MCHC: 33.6 g/dL (ref 30.0–36.0)
MCV: 88.5 fL (ref 80.0–100.0)
Monocytes Absolute: 0.4 10*3/uL (ref 0.1–1.0)
Monocytes Relative: 11 %
Neutro Abs: 1.7 10*3/uL (ref 1.7–7.7)
Neutrophils Relative %: 50 %
Platelets: 230 10*3/uL (ref 150–400)
RBC: 4.27 MIL/uL (ref 4.22–5.81)
RDW: 13.2 % (ref 11.5–15.5)
WBC: 3.3 10*3/uL — ABNORMAL LOW (ref 4.0–10.5)
nRBC: 0 % (ref 0.0–0.2)

## 2022-08-10 LAB — TROPONIN I (HIGH SENSITIVITY)
Troponin I (High Sensitivity): 8 ng/L (ref <18)
Troponin I (High Sensitivity): 20 ng/L — ABNORMAL HIGH (ref <18)

## 2022-08-10 LAB — TSH: TSH: 0.036 u[IU]/mL — ABNORMAL LOW (ref 0.350–4.500)

## 2022-08-10 LAB — RESP PANEL BY RT-PCR (RSV, FLU A&B, COVID)  RVPGX2
Influenza A by PCR: NEGATIVE
Influenza B by PCR: NEGATIVE
Resp Syncytial Virus by PCR: NEGATIVE
SARS Coronavirus 2 by RT PCR: NEGATIVE

## 2022-08-10 LAB — CBG MONITORING, ED: Glucose-Capillary: 112 mg/dL — ABNORMAL HIGH (ref 70–99)

## 2022-08-10 MED ORDER — SODIUM CHLORIDE 0.9 % IV SOLN: Freq: Once | INTRAVENOUS | Status: AC

## 2022-08-10 MED ORDER — POTASSIUM CHLORIDE 10 MEQ/100ML IV SOLN
10.0000 meq | INTRAVENOUS | Status: AC
  Administered 2022-08-10 (×2): 10 meq via INTRAVENOUS
  Filled 2022-08-10 (×2): qty 100

## 2022-08-10 MED ORDER — LACTATED RINGERS IV BOLUS
1000.0000 mL | Freq: Once | INTRAVENOUS | Status: AC
  Administered 2022-08-10: 1000 mL via INTRAVENOUS

## 2022-08-10 MED ORDER — POTASSIUM CHLORIDE CRYS ER 20 MEQ PO TBCR
40.0000 meq | EXTENDED_RELEASE_TABLET | Freq: Once | ORAL | Status: AC
  Administered 2022-08-10: 40 meq via ORAL
  Filled 2022-08-10: qty 2

## 2022-08-10 MED ORDER — ONDANSETRON HCL 4 MG PO TABS: 4.0000 mg | ORAL_TABLET | Freq: Four times a day (QID) | ORAL | 0 refills | Status: DC

## 2022-08-10 MED ORDER — METOCLOPRAMIDE HCL 10 MG PO TABS: 10.0000 mg | ORAL_TABLET | Freq: Three times a day (TID) | ORAL | 0 refills | Status: DC | PRN

## 2022-08-10 NOTE — ED Notes (Signed)
Pt states he got up around 7am and was fine. States dropped somehting under his bed so got on his knees to look and "I guess my a fib started acting up because my heart started beating fast and I got dizzy". Pt denies dizziness now states he feels much better. Pt a/o. NIH negative. Nad. Hr stable around 85

## 2022-08-10 NOTE — ED Notes (Signed)
Pt given crackers and drink 

## 2022-08-10 NOTE — ED Triage Notes (Signed)
Pt arrived by EMS, pt states he stood up from his bed and he got really dizzy and had a near syncopal episode, pt states he has had dizzy spells ever since he was dx with afib 2020, pt denies hx of DM.

## 2022-08-10 NOTE — Telephone Encounter (Signed)
Pt states that he was told to call his "heart doctor" and let him know that he was in the ED. Pt states that he had an episode of afib. Informed pt that we would send a msg to Dr. Johnsie Cancel.

## 2022-08-10 NOTE — ED Notes (Signed)
Pt wants to eat. Messaged edp to make sure pt can eat

## 2022-08-10 NOTE — ED Provider Notes (Signed)
[facility] EMERGENCY DEPARTMENT Provider Note   CSN: [id number] Arrival date & time: 08/10/22  0759     History  Chief Complaint  Patient presents with   Near Syncope    Steven [name] is a 72 y.o. male with A-fib on Eliquis, HTN, IBS, hyperthyroidism on methimazole, migraines, secondary adrenal insufficiency, OSA, rectal bleeding, cholelithiasis who presents with presyncope.   Patient states that he recently was diagnosed with hyperthyroidism and started methimazole approximately week ago.  He states has been dealing with hot flashes daily, nausea and daily nonbloody diarrhea chronically as well as a 30 pound unintentional weight loss.  He was also diagnosed with A-fib in 2020 and has dealt with dizziness since then periodically.  Sometimes he states he also gets dizzy when he stands up after having an episode of diarrhea.  This morning patient woke up, look for something under his nightstand for a few minutes, went to have a bowel movement.  Came back, laid down for a few minutes, stood up and became very lightheaded a/w palpitations. Denies CP but endorses associated shortness of breath.  He states this felt like his episodes of A-fib.  Typically they only last a few seconds and then go away but this 1 lasted approximately 15 to 30 seconds and it took him approximately 5 to 8 minutes before he felt normal again.  He felt like he was going to pass out but he did not, did not fall or hit his head.  He also endorses nausea every day that sometimes prevents him from eating well and he has not had very good p.o. intake for the last several weeks.  He takes Zofran but it does not work sometimes.  He denies any fever/chills, chest pain, abdominal pain, lower extreme edema or pain, numbness tingling, asymmetric weakness, confusion, LOC, recent hospitalizations or surgeries.  He feels normal now but requests that I check his thyroid again to see if the methimazole is taking effect.  Near  Syncope       Home Medications Prior to Admission medications   Medication Sig Start Date End Date Taking? Authorizing Provider  alfuzosin (UROXATRAL) 10 MG 24 hr tablet Take 1 tablet (10 mg total) by mouth daily with breakfast. 04/07/22  Yes [name], Candee Furbish, MD  amLODipine (NORVASC) 10 MG tablet Take 1 tablet by mouth daily. 12/08/20  Yes [provider]  carvedilol (COREG) 6.25 MG tablet TAKE 1 TABLET(6.25 MG) BY MOUTH TWICE DAILY 07/21/22  Yes Josue Hector, MD  Cholecalciferol (VITAMIN D3) 125 MCG (5000 UT) CAPS Take 1 capsule by mouth daily.    Yes [provider]  ELIQUIS 5 MG TABS tablet TAKE 1 TABLET(5 MG) BY MOUTH TWICE DAILY Patient taking differently: Take 5 mg by mouth 2 (two) times daily. 04/24/22  Yes Josue Hector, MD  Erenumab-aooe (AIMOVIG) 70 MG/ML SOAJ Inject 1 mL into the skin every 30 (thirty) days.   Yes [provider]  famotidine (PEPCID) 20 MG tablet Take 20 mg by mouth daily.   Yes [provider]  fluticasone (FLONASE) 50 MCG/ACT nasal spray Place 1 spray into both nostrils daily.   Yes [provider]  gabapentin (NEURONTIN) 300 MG capsule Take 300 mg by mouth 3 (three) times daily. 02/24/20  Yes [provider]  hydrochlorothiazide (HYDRODIURIL) 25 MG tablet Take 25 mg by mouth daily. 07/28/19  Yes [provider]  losartan (COZAAR) 100 MG tablet Take 100 mg by mouth daily. 01/05/21  Yes [provider]  methimazole (TAPAZOLE) 5 MG tablet Take 1 tablet (5 mg total) by mouth daily. 08/02/22  Yes Nida, Marella Chimes, MD  metoCLOPramide (REGLAN) 10 MG tablet Take 1 tablet (10 mg total) by mouth every 8 (eight) hours as needed for nausea or vomiting. 08/10/22  Yes Audley Hose, MD  NEXLETOL 180 MG TABS Take 180 mg by mouth daily. Pt taking 2-3 times a week 12/03/19  Yes [provider]  ondansetron (ZOFRAN-ODT) 4 MG disintegrating tablet Take 1 tablet (4 mg total) by mouth every 8  (eight) hours as needed for nausea or vomiting. 08/01/22  Yes Kommor, Madison, MD  potassium chloride SA (KLOR-CON) 20 MEQ tablet Take 20 mEq by mouth 3 (three) times daily. 12/09/19  Yes [provider]  Probiotic Product (PROBIOTIC-10 PO) Take 1 capsule by mouth daily.   Yes [provider]  promethazine (PHENERGAN) 12.5 MG tablet Take 12.5 mg by mouth every 6 (six) hours as needed for nausea or vomiting.   Yes [provider]  vitamin B-12 (CYANOCOBALAMIN) 100 MCG tablet Take 100 mcg by mouth daily.   Yes [provider]  clindamycin (CLEOCIN) 300 MG capsule Take 300 mg by mouth every 6 (six) hours. Patient not taking: Reported on 08/10/2022 07/27/22   [provider]  penicillin v potassium (VEETID) 500 MG tablet Take 500 mg by mouth 4 (four) times daily. Patient not taking: Reported on 08/01/2022 07/19/22   [provider]      Allergies    Latex and Meclizine    Review of Systems   Review of Systems  Cardiovascular:  Positive for near-syncope.   Review of systems Negative for f/c.  A 10 point review of systems was performed and is negative unless otherwise reported in HPI.  Physical Exam Updated Vital Signs BP (!) 141/79   Pulse 78   Temp 98.1 F (36.7 C) (Oral)   Resp 15   Ht '5\' 7"'$  (1.702 m)   Wt 88.9 kg   SpO2 100%   BMI 30.70 kg/m  Physical Exam General: Normal appearing male, lying in bed.  HEENT: PERRLA, Sclera anicteric, MMM, trachea midline.  Cardiology: RRR, no murmurs/rubs/gallops. BL radial and DP pulses equal bilaterally.  Resp: Normal respiratory rate and effort. CTAB, no wheezes, rhonchi, crackles.  Abd: Soft, non-tender, non-distended. No rebound tenderness or guarding.  GU: Deferred. MSK: No peripheral edema or signs of trauma. Extremities without deformity or TTP. No cyanosis or clubbing. Skin: warm, dry. No rashes or lesions. Back: No CVA tenderness Neuro: A&Ox4, CNs II-XII grossly intact. MAEs. Sensation  grossly intact.  Psych: Normal mood and affect.   ED Results / Procedures / Treatments   Labs (all labs ordered are listed, but only abnormal results are displayed) Labs Reviewed  BASIC METABOLIC PANEL - Abnormal; Notable for the following components:      Result Value   Potassium 2.8 (*)    Glucose, Bld 107 (*)    BUN <5 (*)    All other components within normal limits  CBC WITH DIFFERENTIAL/PLATELET - Abnormal; Notable for the following components:   WBC 3.3 (*)    Hemoglobin 12.7 (*)    HCT 37.8 (*)    All other components within normal limits  TSH - Abnormal; Notable for the following components:   TSH 0.036 (*)    All other components within normal limits  CBG MONITORING, ED - Abnormal; Notable for the following components:   Glucose-Capillary 112 (*)  All other components within normal limits  TROPONIN I (HIGH SENSITIVITY) - Abnormal; Notable for the following components:   Troponin I (High Sensitivity) 20 (*)    All other components within normal limits  RESP PANEL BY RT-PCR (RSV, FLU A&B, COVID)  [id number]  TROPONIN I (HIGH SENSITIVITY)    EKG EKG Interpretation  Date/Time:  Thursday August 10 2022 08:17:20 EST Ventricular Rate:  85 PR Interval:  160 QRS Duration: 88 QT Interval:  365 QTC Calculation: 434 R Axis:   -5 Text Interpretation: Sinus rhythm Low voltage, precordial leads Confirmed by Cindee Lame [phone]) on 08/10/2022 9:40:19 AM  Radiology DG Chest 2 View  Result Date: 08/10/2022 CLINICAL DATA:  Dizziness. EXAM: CHEST - 2 VIEW COMPARISON:  08/01/2022 FINDINGS: AP and lateral views of the chest show low lung volumes. The cardio pericardial silhouette is enlarged. There is pulmonary vascular congestion without overt pulmonary edema. Possible left base atelectasis or infiltrate with potential tiny left pleural effusion. The visualized bony structures of the thorax are unremarkable. Telemetry leads overlie the chest. IMPRESSION: Low volume film with  vascular congestion and possible left base atelectasis or infiltrate with potential tiny left pleural effusion. Electronically Signed   By: Misty Stanley M.D.   On: 08/10/2022 11:46    Procedures Procedures    Medications Ordered in ED Medications  potassium chloride SA (KLOR-CON M) CR tablet 40 mEq (40 mEq Oral Given 08/10/22 1031)  potassium chloride 10 mEq in 100 mL IVPB (0 mEq Intravenous Stopped 08/10/22 1339)  lactated ringers bolus 1,000 mL (0 mLs Intravenous Stopped 08/10/22 1146)  0.9 %  sodium chloride infusion (0 mLs Intravenous Stopped 08/10/22 1339)    ED Course/ Medical Decision Making/ A&P                          Medical Decision Making Amount and/or Complexity of Data Reviewed Labs: ordered. Decision-making details documented in ED Course. Radiology: ordered. Decision-making details documented in ED Course. ECG/medicine tests: ordered.  Risk Prescription drug management.    This patient presents to the ED for concern of presyncope, this involves an extensive number of treatment options, and is a complaint that carries with it a high risk of complications and morbidity.  I considered the following differential and admission for this acute, potentially life threatening condition.   MDM:    DDX for this patient's pre-syncope includes but is not limited to:  Consider Afib/flutter given patient's history and sensation of palpitations. Consider orthostatic hypotension/presyncope given h/o of poor p.o. intake as well as diarrhea. Consider anemia and electrolyte abnormalities/renal injury as possible etiologies. Given history, exam and workup, low suspicion for HF, ICH (no trauma, headache), stroke (no focal neuro deficits), ACS (neg trop, no anginal pain), aortic dissection (no chest pain), or GI bleed (stable hgb). Low suspicion for PE given normal vital signs, absence of chest pain or hypoxia, no evidence of DVT, no recent surgery/immobilizations. Patient's chronic symptoms  of nausea/diarrhea, hot flashes, weight loss do seem to align with his recent diagnosis of hyperthyroidism and he really just began treatment.  Will check the TSH today at patient's request but it may not be significantly different from his prior given that his treatment is newly initiated.  With patient's cough and lightheadedness will evaluate with chest x-ray for possible bronchitis, pneumonia, or COVID flu RSV.   Clinical Course as of 08/10/22 1605  Thu Aug 10, 2022  0939 Glucose-Capillary(!): 112 [HN]  0939 Hemoglobin(!): 12.7 [  HN]  0939 WBC(!): 3.3 [HN]  0939 Potassium(!): 2.8 Patient is found to have potassium of 2.8 likely in the setting of poor p.o. intake and diarrhea.  Patient states he takes potassium supplements already.  Will replete him today and have him follow-up within the next few days for recheck. [HN]  1318 Resp panel by RT-PCR (RSV, Flu A&B, Covid) Anterior Nasal Swab Neg [HN]  1318 TSH(!): 0.036 Low as expected w/ recent hyperthyroidism diagnosis [HN]  1319 Patient had positive orthostatic vitals and feels better after 1L IVF [HN]  1508 DG Chest 2 View AP and lateral views of the chest show low lung volumes. The cardio pericardial silhouette is enlarged. There is pulmonary vascular congestion without overt pulmonary edema. Possible left base atelectasis or infiltrate with potential tiny left pleural effusion. The visualized bony structures of the thorax are unremarkable. Telemetry leads overlie the chest.  IMPRESSION: Low volume film with vascular congestion and possible left base atelectasis or infiltrate with potential tiny left pleural effusion.   [HN]  1516 Troponin I (High Sensitivity)(!): 20 Trop 6-->20. Given h/o and presentation will d/w cards [HN]  1537 D/w cardiology who states patient has f/u appt in 2/2 and is okay to f/u o/p [HN]  1559 Patient states his zofran isn't working and he is still nauseated almost daily even despite taking Zofran.  So that he  can stay well-hydrated we will prescribe him Reglan to try at home. [HN]    Clinical Course User Index [HN] Audley Hose, MD    Labs: I Ordered, and personally interpreted labs.  The pertinent results include:  as listed above  Imaging Studies ordered: I ordered imaging studies including CXR I independently visualized and interpreted imaging. I agree with the radiologist interpretation  Additional history obtained from chart review.    Cardiac Monitoring: The patient was maintained on a cardiac monitor.  I personally viewed and interpreted the cardiac monitored which showed an underlying rhythm of: NSR  Reevaluation: After the interventions noted above, I reevaluated the patient and found that they have :resolved  Social Determinants of Health: Patient lives independently and alone  Disposition:  Patient felt improved after fluids.  Lab w/u significant for potassium 2.8 which was repleted both p.o. and IV.  Likely due to patient's diarrhea.  Patient has started treatment for his hyperthyroidism so I anticipate the diarrhea will begin improving.  In the meantime patient is prescribed Reglan to try to stay well-hydrated as he states the Zofran is not working.  Chest x-ray demonstrates possible left atelectasis versus infiltrate though patient has no cough shortness of breath or symptoms of pneumonia.  Patient is instructed to continue taking his potassium supplementation at home and follow-up with his primary care physician within 1 week to have the levels retested.  Discussed with cardiology will follow-up patient on 08/25/2022.  Discharged with discharge instruction return precautions, all questions answered to be satisfaction.   Co morbidities that complicate the patient evaluation  Past Medical History:  Diagnosis Date   Anxiety    Arthritis    Atrial fibrillation (HCC)    Atrial flutter (HCC)    BPH (benign prostatic hyperplasia)    Chest pain    Dizziness    Dyspnea     laying down occ   Fracture 08/17/2015   MULTIPLE RIB FRACTURES     FROM FALL    GERD (gastroesophageal reflux disease)    Hemorrhoids    History of kidney stones    noted on  CT scan   History of radiation therapy 08/22/11-10/13/11   prostate   Hyperlipemia    Hypertension    Hyperthyroidism    IBS (irritable bowel syndrome)    Insomnia    Light headedness    Migraine    Numbness and tingling in left arm    Numbness and tingling of both legs    Open fracture of left elbow 08/18/2015   Prostate cancer [facility])    prostate s/p radiation Mar 2013   Rib fractures 08/17/2015   Tinnitus      Medicines Meds ordered this encounter  Medications   potassium chloride SA (KLOR-CON M) CR tablet 40 mEq   potassium chloride 10 mEq in 100 mL IVPB   lactated ringers bolus 1,000 mL   0.9 %  sodium chloride infusion   DISCONTD: ondansetron (ZOFRAN) 4 MG tablet    Sig: Take 1 tablet (4 mg total) by mouth every 6 (six) hours.    Dispense:  12 tablet    Refill:  0   metoCLOPramide (REGLAN) 10 MG tablet    Sig: Take 1 tablet (10 mg total) by mouth every 8 (eight) hours as needed for nausea or vomiting.    Dispense:  30 tablet    Refill:  0    I have reviewed the patients home medicines and have made adjustments as needed  Problem List / ED Course: Problem List Items Addressed This Visit   None Visit Diagnoses     Near syncope    -  Primary   Hypokalemia                       This note was created using dictation software, which may contain spelling or grammatical errors.    Audley Hose, MD 08/10/22 [phone]

## 2022-08-10 NOTE — Addendum Note (Signed)
Addended by: Levonne Hubert on: 08/10/2022 02:15 PM   Modules accepted: Orders

## 2022-08-10 NOTE — Telephone Encounter (Signed)
Patient c/o Palpitations:  High priority if patient c/o lightheadedness, shortness of breath, or chest pain  How long have you had palpitations/irregular HR/ Afib? Are you having the symptoms now?  States he went into afib around 7:00 AM, felt faint and went to the ED, currently still at ED - no longer in afib  Are you currently experiencing lightheadedness, SOB or CP?  Not currently  Do you have a history of afib (atrial fibrillation) or irregular heart rhythm?  Afib dx   Have you checked your BP or HR? (document readings if available):  129/86 84  Are you experiencing any other symptoms?  Sweats, dizziness/lightheadedness, mild chest pressure, loss of appetite, nausea for the past month on and off, lost about 30 lbs since November, mentions overactive thyroid/low potassium

## 2022-08-10 NOTE — Telephone Encounter (Addendum)
Pt notified of Dr. Kyla Balzarine response and the need for a 30 day monitor. Order placed. Pt enrolled in [employer].

## 2022-08-10 NOTE — Discharge Instructions (Addendum)
Thank you for coming to [facility] Emergency Department. You were seen for lightheadedness. We did an exam, labs, and imaging, and these showed low potassium level and likely dehydration.  We have prescribed you Reglan to try instead of zofran in order to stay well-hydrated at home.  Please drink plenty of fluids.  Please continue to take your potassium supplementation as originally prescribed. Please follow up with your primary care provider within 1 week to haver your potassium levels rechecked. You have an appointment with your cardiologist on 08/25/2022, please keep this appointment.  Do not hesitate to return to the ED or call 911 if you experience: -Worsening symptoms -Chest pain, shortness of breath -Lightheadedness, passing out -Fevers/chills -Anything else that concerns you

## 2022-08-11 NOTE — Telephone Encounter (Signed)
Spoke to pt, informed him of recommendations. Pt  voiced understanding. Pt states he went to the ED yesterday. He felt like he was going to pass out. He states he is still having watery diarrhea. He feels lightheaded, weak and can't eat. Would like you to call him to discuss more about having his gallbladder removed.

## 2022-08-13 ENCOUNTER — Emergency Department (HOSPITAL_COMMUNITY): Payer: 59

## 2022-08-13 ENCOUNTER — Other Ambulatory Visit: Payer: Self-pay

## 2022-08-13 ENCOUNTER — Encounter (HOSPITAL_COMMUNITY): Payer: Self-pay

## 2022-08-13 ENCOUNTER — Emergency Department (HOSPITAL_COMMUNITY)
Admission: EM | Admit: 2022-08-13 | Discharge: 2022-08-13 | Disposition: A | Discharge status: Home / Self Care | Payer: 59 | Attending: Emergency Medicine | Admitting: Emergency Medicine

## 2022-08-13 DIAGNOSIS — R079 Chest pain, unspecified: Secondary | ICD-10-CM | POA: Insufficient documentation

## 2022-08-13 DIAGNOSIS — R519 Headache, unspecified: Secondary | ICD-10-CM | POA: Insufficient documentation

## 2022-08-13 DIAGNOSIS — K5900 Constipation, unspecified: Secondary | ICD-10-CM | POA: Diagnosis not present

## 2022-08-13 DIAGNOSIS — M419 Scoliosis, unspecified: Secondary | ICD-10-CM | POA: Diagnosis not present

## 2022-08-13 DIAGNOSIS — R531 Weakness: Secondary | ICD-10-CM | POA: Insufficient documentation

## 2022-08-13 DIAGNOSIS — Z7901 Long term (current) use of anticoagulants: Secondary | ICD-10-CM | POA: Diagnosis not present

## 2022-08-13 DIAGNOSIS — K580 Irritable bowel syndrome with diarrhea: Secondary | ICD-10-CM | POA: Insufficient documentation

## 2022-08-13 DIAGNOSIS — R0789 Other chest pain: Secondary | ICD-10-CM | POA: Diagnosis not present

## 2022-08-13 DIAGNOSIS — R109 Unspecified abdominal pain: Secondary | ICD-10-CM | POA: Diagnosis present

## 2022-08-13 DIAGNOSIS — R1084 Generalized abdominal pain: Secondary | ICD-10-CM | POA: Insufficient documentation

## 2022-08-13 DIAGNOSIS — R0602 Shortness of breath: Secondary | ICD-10-CM | POA: Insufficient documentation

## 2022-08-13 DIAGNOSIS — Z91040 Latex allergy status: Secondary | ICD-10-CM | POA: Insufficient documentation

## 2022-08-13 DIAGNOSIS — K627 Radiation proctitis: Secondary | ICD-10-CM | POA: Diagnosis not present

## 2022-08-13 LAB — CBC WITH DIFFERENTIAL/PLATELET
Abs Immature Granulocytes: 0 10*3/uL (ref 0.00–0.07)
Basophils Absolute: 0 10*3/uL (ref 0.0–0.1)
Basophils Relative: 1 %
Eosinophils Absolute: 0.1 10*3/uL (ref 0.0–0.5)
Eosinophils Relative: 2 %
HCT: 40.9 % (ref 39.0–52.0)
Hemoglobin: 14 g/dL (ref 13.0–17.0)
Immature Granulocytes: 0 %
Lymphocytes Relative: 44 %
Lymphs Abs: 1.6 10*3/uL (ref 0.7–4.0)
MCH: 30 pg (ref 26.0–34.0)
MCHC: 34.2 g/dL (ref 30.0–36.0)
MCV: 87.8 fL (ref 80.0–100.0)
Monocytes Absolute: 0.3 10*3/uL (ref 0.1–1.0)
Monocytes Relative: 9 %
Neutro Abs: 1.6 10*3/uL — ABNORMAL LOW (ref 1.7–7.7)
Neutrophils Relative %: 44 %
Platelets: 242 10*3/uL (ref 150–400)
RBC: 4.66 MIL/uL (ref 4.22–5.81)
RDW: 13.1 % (ref 11.5–15.5)
WBC: 3.6 10*3/uL — ABNORMAL LOW (ref 4.0–10.5)
nRBC: 0 % (ref 0.0–0.2)

## 2022-08-13 LAB — COMPREHENSIVE METABOLIC PANEL
ALT: 39 U/L (ref 0–44)
AST: 32 U/L (ref 15–41)
Albumin: 4 g/dL (ref 3.5–5.0)
Alkaline Phosphatase: 44 U/L (ref 38–126)
Anion gap: 11 (ref 5–15)
BUN: <5 mg/dL — ABNORMAL LOW (ref 8–23)
CO2: 27 mmol/L (ref 22–32)
Calcium: 9.7 mg/dL (ref 8.9–10.3)
Chloride: 98 mmol/L (ref 98–111)
Creatinine, Ser: 0.81 mg/dL (ref 0.61–1.24)
GFR, Estimated: >60 mL/min (ref >60)
Glucose, Bld: 111 mg/dL — ABNORMAL HIGH (ref 70–99)
Potassium: 3.1 mmol/L — ABNORMAL LOW (ref 3.5–5.1)
Sodium: 136 mmol/L (ref 135–145)
Total Bilirubin: 0.9 mg/dL (ref 0.3–1.2)
Total Protein: 7.2 g/dL (ref 6.5–8.1)

## 2022-08-13 LAB — TROPONIN I (HIGH SENSITIVITY)
Troponin I (High Sensitivity): 8 ng/L (ref <18)
Troponin I (High Sensitivity): 7 ng/L (ref <18)

## 2022-08-13 LAB — LIPASE, BLOOD: Lipase: 33 U/L (ref 11–51)

## 2022-08-13 LAB — BRAIN NATRIURETIC PEPTIDE: B Natriuretic Peptide: 8 pg/mL (ref 0.0–100.0)

## 2022-08-13 LAB — TSH: TSH: 0.03 u[IU]/mL — ABNORMAL LOW (ref 0.350–4.500)

## 2022-08-13 MED ORDER — HYOSCYAMINE SULFATE 0.125 MG SL SUBL: 0.1250 mg | SUBLINGUAL_TABLET | SUBLINGUAL | 0 refills | Status: DC | PRN

## 2022-08-13 NOTE — Discharge Instructions (Signed)
Schedule follow-up with your GI doctor as soon as possible.  Do not take any laxatives other than your MiraLAX.  Do not take medication to treat diarrhea.  If your stools become loose, skip doses of your MiraLAX.

## 2022-08-13 NOTE — ED Provider Notes (Signed)
[facility] Provider Note   CSN: [id number] Arrival date & time: 08/13/22  0330     History  Chief Complaint  Patient presents with   Chest Pain    Steven [name] is a 72 y.o. male.  Patient presents to the urgency department for evaluation of multiple complaints.  Patient reports that he has been having trouble for a couple of months.  He has had intermittent headaches, dizziness, chest pain, shortness of breath, abdominal pain.  He reports that he has a history of irritable bowel syndrome, goes from being constipated to having diarrhea.  Whenever he treats 1, the other occurs.  He has been in the ED several times.  He reports that they found a gallstone at one of the visits, he was seen by surgery but it was not recommended to take his gallbladder out until it was determined if his symptoms were secondary to the gallstone or his irritable bowel.  Patient feeling constipated tonight.  He reports generalized weakness that worsens with any effort.       Home Medications Prior to Admission medications   Medication Sig Start Date End Date Taking? Authorizing Provider  hyoscyamine (LEVSIN SL) 0.125 MG SL tablet Place 1 tablet (0.125 mg total) under the tongue every 4 (four) hours as needed for cramping (abdominal pain). 08/13/22  Yes Delonta Yohannes, Gwenyth Allegra, MD  alfuzosin (UROXATRAL) 10 MG 24 hr tablet Take 1 tablet (10 mg total) by mouth daily with breakfast. 04/07/22   [name], Candee Furbish, MD  amLODipine (NORVASC) 10 MG tablet Take 1 tablet by mouth daily. 12/08/20   [provider]  carvedilol (COREG) 6.25 MG tablet TAKE 1 TABLET(6.25 MG) BY MOUTH TWICE DAILY 07/21/22   Josue Hector, MD  Cholecalciferol (VITAMIN D3) 125 MCG (5000 UT) CAPS Take 1 capsule by mouth daily.     [provider]  clindamycin (CLEOCIN) 300 MG capsule Take 300 mg by mouth every 6 (six) hours. Patient not taking: Reported on 08/10/2022 07/27/22    [provider]  ELIQUIS 5 MG TABS tablet TAKE 1 TABLET(5 MG) BY MOUTH TWICE DAILY Patient taking differently: Take 5 mg by mouth 2 (two) times daily. 04/24/22   Josue Hector, MD  Erenumab-aooe (AIMOVIG) 70 MG/ML SOAJ Inject 1 mL into the skin every 30 (thirty) days.    [provider]  famotidine (PEPCID) 20 MG tablet Take 20 mg by mouth daily.    [provider]  fluticasone (FLONASE) 50 MCG/ACT nasal spray Place 1 spray into both nostrils daily.    [provider]  gabapentin (NEURONTIN) 300 MG capsule Take 300 mg by mouth 3 (three) times daily. 02/24/20   [provider]  hydrochlorothiazide (HYDRODIURIL) 25 MG tablet Take 25 mg by mouth daily. 07/28/19   [provider]  losartan (COZAAR) 100 MG tablet Take 100 mg by mouth daily. 01/05/21   [provider]  methimazole (TAPAZOLE) 5 MG tablet Take 1 tablet (5 mg total) by mouth daily. 08/02/22   Cassandria Anger, MD  metoCLOPramide (REGLAN) 10 MG tablet Take 1 tablet (10 mg total) by mouth every 8 (eight) hours as needed for nausea or vomiting. 08/10/22   Audley Hose, MD  NEXLETOL 180 MG TABS Take 180 mg by mouth daily. Pt taking 2-3 times a week 12/03/19   [provider]  ondansetron (ZOFRAN-ODT) 4 MG disintegrating tablet Take 1 tablet (4 mg total) by mouth every 8 (eight) hours  as needed for nausea or vomiting. 08/01/22   Kommor, Madison, MD  penicillin v potassium (VEETID) 500 MG tablet Take 500 mg by mouth 4 (four) times daily. Patient not taking: Reported on 08/01/2022 07/19/22   [provider]  potassium chloride SA (KLOR-CON) 20 MEQ tablet Take 20 mEq by mouth 3 (three) times daily. 12/09/19   [provider]  Probiotic Product (PROBIOTIC-10 PO) Take 1 capsule by mouth daily.    [provider]  promethazine (PHENERGAN) 12.5 MG tablet Take 12.5 mg by mouth every 6 (six) hours as needed for nausea or vomiting.    [provider]   vitamin B-12 (CYANOCOBALAMIN) 100 MCG tablet Take 100 mcg by mouth daily.    [provider]      Allergies    Latex and Meclizine    Review of Systems   Review of Systems  Physical Exam Updated Vital Signs BP 138/80   Pulse 65   Temp 98.3 F (36.8 C) (Oral)   Resp 19   Ht '5\' 7"'$  (1.702 m)   Wt 88.9 kg   SpO2 97%   BMI 30.70 kg/m  Physical Exam Vitals and nursing note reviewed.  Constitutional:      General: He is not in acute distress.    Appearance: He is well-developed.  HENT:     Head: Normocephalic and atraumatic.     Mouth/Throat:     Mouth: Mucous membranes are moist.  Eyes:     General: Vision grossly intact. Gaze aligned appropriately.     Extraocular Movements: Extraocular movements intact.     Conjunctiva/sclera: Conjunctivae normal.  Cardiovascular:     Rate and Rhythm: Normal rate and regular rhythm.     Pulses: Normal pulses.     Heart sounds: Normal heart sounds, S1 normal and S2 normal. No murmur heard.    No friction rub. No gallop.  Pulmonary:     Effort: Pulmonary effort is normal. No respiratory distress.     Breath sounds: Normal breath sounds.  Abdominal:     Palpations: Abdomen is soft.     Tenderness: There is no abdominal tenderness. There is no guarding or rebound.     Hernia: No hernia is present.  Musculoskeletal:        General: No swelling.     Cervical back: Full passive range of motion without pain, normal range of motion and neck supple. No pain with movement, spinous process tenderness or muscular tenderness. Normal range of motion.     Right lower leg: No edema.     Left lower leg: No edema.  Skin:    General: Skin is warm and dry.     Capillary Refill: Capillary refill takes less than 2 seconds.     Findings: No ecchymosis, erythema, lesion or wound.  Neurological:     Mental Status: He is alert and oriented to person, place, and time.     GCS: GCS eye subscore is 4. GCS verbal subscore is 5. GCS motor subscore is  6.     Cranial Nerves: Cranial nerves 2-12 are intact.     Sensory: Sensation is intact.     Motor: Motor function is intact. No weakness or abnormal muscle tone.     Coordination: Coordination is intact.  Psychiatric:        Mood and Affect: Mood normal.        Speech: Speech normal.        Behavior: Behavior normal.  ED Results / Procedures / Treatments   Labs (all labs ordered are listed, but only abnormal results are displayed) Labs Reviewed  CBC WITH DIFFERENTIAL/PLATELET - Abnormal; Notable for the following components:      Result Value   WBC 3.6 (*)    Neutro Abs 1.6 (*)    All other components within normal limits  COMPREHENSIVE METABOLIC PANEL - Abnormal; Notable for the following components:   Potassium 3.1 (*)    Glucose, Bld 111 (*)    BUN <5 (*)    All other components within normal limits  TSH - Abnormal; Notable for the following components:   TSH 0.030 (*)    All other components within normal limits  LIPASE, BLOOD  BRAIN NATRIURETIC PEPTIDE  URINALYSIS, ROUTINE W REFLEX MICROSCOPIC  TROPONIN I (HIGH SENSITIVITY)  TROPONIN I (HIGH SENSITIVITY)    EKG EKG Interpretation  Date/Time:  Sunday August 13 2022 03:42:16 EST Ventricular Rate:  77 PR Interval:  154 QRS Duration: 82 QT Interval:  370 QTC Calculation: 419 R Axis:   -9 Text Interpretation: Sinus rhythm Normal ECG Confirmed by [name] [phone]) on 08/13/2022 3:58:15 AM  Radiology DG ABD ACUTE 2+V W 1V CHEST  Result Date: 08/13/2022 CLINICAL DATA:  Abdominal pain with constipated feeling EXAM: DG ABDOMEN ACUTE WITH 1 VIEW CHEST COMPARISON:  08/10/2022 FINDINGS: No abnormal stool retention. A few proximal colonic fluid levels are noted, which may be in normal limits. No gas dilated small bowel for obstruction. No concerning mass effect or calcification. Prostate fiducial markers. Scoliosis. Scar-like density over the lower lobes also seen on prior abdominal CT. Normal heart size and  mediastinal contours. No edema or consolidation. IMPRESSION: No abnormal stool retention.  No dilated bowel to imply obstruction. Electronically Signed   By: Jorje Guild M.D.   On: 08/13/2022 04:31    Procedures Procedures    Medications Ordered in ED Medications - No data to display  ED Course/ Medical Decision Making/ A&P                             Medical Decision Making Amount and/or Complexity of Data Reviewed External Data Reviewed: labs, ECG and notes. Labs: ordered. Decision-making details documented in ED Course. Radiology: ordered and independent interpretation performed. Decision-making details documented in ED Course. ECG/medicine tests: ordered and independent interpretation performed. Decision-making details documented in ED Course.   Patient presents to the emergency department for evaluation of multiple problems.  Patient with abdominal pain, cramping with shooting pains that go up into the chest and it causes him to be short of breath.  This been ongoing for some time.  During one of his workups he was noted to have a gallstone, has seen general surgery but it is not clear if the gallstone is causing his symptoms.  He also has recently been diagnosed as hyperthyroid, started methimazole approximately a week ago.  Suspect some of his GI symptoms are secondary to the hyperthyroidism.  TSH is still low, he has not been on the medication long enough to see effect.  I did consider Linzess, but he reports that he has had excessive diarrhea with Linzess in the past.  He is taking MiraLAX daily and additional laxatives when he feels constipated.  He then develops diarrhea and takes medication to stop the diarrhea.  This appears to be a vicious cycle.  X-ray today does not show any significant constipation.  He is to  back off on most of his laxatives and not treat the diarrhea other than decreasing dosing of his MiraLAX.  Will add Levsin to be used as needed for cramping.  He is to  call his GI doctor Monday.  Patient has had intermittent pains up into the chest.  This is not related to exertion and does not last long while that is there.  It seems to only coincide with abdominal cramping.  His cardiac evaluation has been reassuring.        Final Clinical Impression(s) / ED Diagnoses Final diagnoses:  Generalized abdominal pain    Rx / DC Orders ED Discharge Orders          Ordered    hyoscyamine (LEVSIN SL) 0.125 MG SL tablet  Every 4 hours PRN        08/13/22 0614              [name], MD 08/13/22 [phone]

## 2022-08-13 NOTE — ED Notes (Signed)
Patient transported to X-ray 

## 2022-08-13 NOTE — ED Triage Notes (Signed)
Pt arrived via POV c/o persistent CP, abdominal pain, nausea, night sweats, headache and reports being nhere multiple times since November for same complaints w/o any improvements.

## 2022-08-15 ENCOUNTER — Telehealth: Payer: Self-pay | Admitting: "Endocrinology

## 2022-08-15 DIAGNOSIS — I10 Essential (primary) hypertension: Secondary | ICD-10-CM | POA: Diagnosis not present

## 2022-08-15 DIAGNOSIS — K529 Noninfective gastroenteritis and colitis, unspecified: Secondary | ICD-10-CM | POA: Diagnosis not present

## 2022-08-15 DIAGNOSIS — E876 Hypokalemia: Secondary | ICD-10-CM | POA: Diagnosis not present

## 2022-08-15 DIAGNOSIS — R232 Flushing: Secondary | ICD-10-CM | POA: Diagnosis not present

## 2022-08-15 DIAGNOSIS — M5412 Radiculopathy, cervical region: Secondary | ICD-10-CM | POA: Diagnosis not present

## 2022-08-15 NOTE — Telephone Encounter (Signed)
Pt is asking for a call back in reference to his gallbladder surgery, he has questions concerning this.

## 2022-08-15 NOTE — Telephone Encounter (Signed)
Spoke with pt, he states he continues to experience the same symptoms as he had at the time of his visit on 08/02/22 which consists of nausea, loss of appetite, weakness and fatigue. States he is taking the methimazole '5mg'$  daily. He has called his GI specialist and is awaiting a call back from them. Pt states he has been experiencing symptoms since November and has not had any resolved since starting the methimazole. Pt requested your opinion and suggestions of any further testing or treatment.

## 2022-08-15 NOTE — Telephone Encounter (Signed)
Pt called back stating that he is nauseous everyday. States that he is constantly having hot flashes and is having loose bowels but that he is still having to strain with them. Pt states that he is also having left side pain and lower abdominal pain. Pt states that he had the nurse send a message Friday but hasn't heard back. Please advise.

## 2022-08-16 ENCOUNTER — Telehealth: Payer: Self-pay | Admitting: Gastroenterology

## 2022-08-16 DIAGNOSIS — I482 Chronic atrial fibrillation, unspecified: Secondary | ICD-10-CM | POA: Diagnosis not present

## 2022-08-16 DIAGNOSIS — I480 Paroxysmal atrial fibrillation: Secondary | ICD-10-CM | POA: Diagnosis not present

## 2022-08-16 DIAGNOSIS — K529 Noninfective gastroenteritis and colitis, unspecified: Secondary | ICD-10-CM | POA: Diagnosis not present

## 2022-08-16 DIAGNOSIS — R109 Unspecified abdominal pain: Secondary | ICD-10-CM | POA: Diagnosis not present

## 2022-08-16 DIAGNOSIS — R232 Flushing: Secondary | ICD-10-CM | POA: Diagnosis not present

## 2022-08-16 NOTE — Telephone Encounter (Signed)
Spoke to pt, informed him of recommendations. Pt voiced understanding. Pt states he is still not feeling well. Still having hot flashes and nausea. I informed him if he feels that bad he needs to go to ED. Pt states he is waiting on a ride so he can have his labs done and may have to go to ED.

## 2022-08-16 NOTE — Telephone Encounter (Signed)
See previous note

## 2022-08-16 NOTE — Telephone Encounter (Signed)
Mandy with [facility] called and said this patient has told them he has left messages for [place] to call him and we never call him back.  I saw where he spoke to our office on 1/16 & 1/17 and I told her that.  He currently has an appointment with [facility]. On 2/27.  Leafy Ro said that Dr. Gerarda Fraction thinks he has a tumor that is causing all of his problems.  She wanted [place] to call him and make sure he knew that he had an appointment on 2/27.  Not really sure why that appointment was made.

## 2022-08-16 NOTE — Telephone Encounter (Signed)
COURTNEY..... Dr. Gerarda Fraction would like to speak with you on the phone about this patient.  Mandy at [facility] has called back and said that Dr. Gerarda Fraction wants to explain the workup he is doing on this patient and why he suspects a tumor.  Leafy Ro was going to let the front office know that you are going to call and they will pull Dr. Gerarda Fraction out of a room.  [phone] is the number to call

## 2022-08-16 NOTE — Progress Notes (Signed)
Cardiology Office Note    Virtual Visit via Video Note   This visit type was conducted due to national recommendations for restrictions regarding the COVID-19 Pandemic (e.g. social distancing) in an effort to limit this patient's exposure and mitigate transmission in our community.  Due to her co-morbid illnesses, this patient is at least at moderate risk for complications without adequate follow up.  This format is felt to be most appropriate for this patient at this time.  All issues noted in this document were discussed and addressed.  A limited physical exam was performed with this format.  Please refer to the patient's chart for her consent to telehealth for [facility].   Date:  08/16/2022   ID:  Steven [name], DOB 09/06/1950, MRN [id number]  PCP:  Redmond School, MD  Cardiologist: Jenkins Rouge, MD    Patient location: home Physician location: office    History of Present Illness:    Steven [name] is a 72 y.o. male with past medical history of paroxysmal atrial fibrillation/flutter, HTN, and HLD F/U Cardiac monitor done 04/09/19 for dizziness showed no long pauses and no more PAF. Myovue done 06/25/19 with no  Ischemia EF 62% Echo 03/02/19 EF 60-65% no valve disease Lopressor was decreased with no change in dizziness  Still has dizziness, swimmy headed feeling  Now on eliquis for anticoagulation BP only 381 systolic and w/u negative  Seen in ER 05/14/20 with elevated BP and malaise with dizziness  He sees Dr Merlene Laughter for headaches and was seen in ER for this 06/04/20 Takes Topamax and Gabapentin CT head negative for bleed or other pathology   Continues to have his issues with likely panic attacks with headaches, pounding in head and dizziness Multiple somatic complaints seem to eminate from anxiety  Seen again in ED 02/17/21 with fatigue and dizziness BP/HR telemetry normal   Was on prednisone for shingles ECG SR rate 56 normal 02/18/21   06/22/22 Seen in ED for dizziness and  poor appetite ECG normal and telemetry normal  08/13/22 Seen in ED for headache, chest pain, dyspnea and abdominal pain was in NSR Noted hyperthyroidism and started on methimazole a week prior TSH has been suppressed since 08/01/22 and normal a year ago Troponin negative ECG non acute  KUB non acute CXR atelectasis    Always seems to have multiple complaints and never feels well Still wearing monitor   Past Medical History:  Diagnosis Date   Anxiety    Arthritis    Atrial fibrillation (HCC)    Atrial flutter (HCC)    BPH (benign prostatic hyperplasia)    Chest pain    Dizziness    Dyspnea    laying down occ   Fracture 08/17/2015   MULTIPLE RIB FRACTURES     FROM FALL    GERD (gastroesophageal reflux disease)    Hemorrhoids    History of kidney stones    noted on CT scan   History of radiation therapy 08/22/11-10/13/11   prostate   Hyperlipemia    Hypertension    Hyperthyroidism    IBS (irritable bowel syndrome)    Insomnia    Light headedness    Migraine    Numbness and tingling in left arm    Numbness and tingling of both legs    Open fracture of left elbow 08/18/2015   Prostate cancer [facility])    prostate s/p radiation Mar 2013   Rib fractures 08/17/2015   Tinnitus     Past Surgical  History:  Procedure Laterality Date   BOTOX INJECTION N/A 01/14/2020   Procedure: INJECTION OF BOTOX INTO ANAL SPHINCTER;  Surgeon: Ileana Roup, MD;  Location: [facility] ORS;  Service: General;  Laterality: N/A;   COLONOSCOPY N/A 12/19/2012   VQM:GQQPYP 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 Normal appearing colon   COLONOSCOPY WITH PROPOFOL N/A 10/23/2019   Procedure: COLONOSCOPY WITH PROPOFOL;  Surgeon: Daneil Dolin, MD; external and grade 2 internal hemorrhoids, abnormal rectal blood vessels consistent with radiation proctitis s/p APC therapy, otherwise normal exam.   EVALUATION UNDER ANESTHESIA WITH ANAL FISTULECTOMY N/A  01/14/2020   Procedure: ANORECTAL EXAM UNDER ANESTHESIA;  Surgeon: Ileana Roup, MD;  Location: [facility];  Service: General;  Laterality: N/A;   HOT HEMOSTASIS  10/23/2019   Procedure: HOT HEMOSTASIS (ARGON PLASMA COAGULATION/BICAP);  Surgeon: Daneil Dolin, MD;  Location: [facility];  Service: Endoscopy;;  apc rectal proctitis     KIDNEY SURGERY  1982   kidney tube collapse repair   RADIOACTIVE SEED IMPLANT     Prostate    Current Medications: Outpatient Medications Prior to Visit  Medication Sig Dispense Refill   alfuzosin (UROXATRAL) 10 MG 24 hr tablet Take 1 tablet (10 mg total) by mouth daily with breakfast. 90 tablet 3   amLODipine (NORVASC) 10 MG tablet Take 1 tablet by mouth daily.     carvedilol (COREG) 6.25 MG tablet TAKE 1 TABLET(6.25 MG) BY MOUTH TWICE DAILY 180 tablet 2   Cholecalciferol (VITAMIN D3) 125 MCG (5000 UT) CAPS Take 1 capsule by mouth daily.      ELIQUIS 5 MG TABS tablet TAKE 1 TABLET(5 MG) BY MOUTH TWICE DAILY (Patient taking differently: Take 5 mg by mouth 2 (two) times daily.) 60 tablet 5   Erenumab-aooe (AIMOVIG) 70 MG/ML SOAJ Inject 1 mL into the skin every 30 (thirty) days.     famotidine (PEPCID) 20 MG tablet Take 20 mg by mouth daily.     fluticasone (FLONASE) 50 MCG/ACT nasal spray Place 1 spray into both nostrils daily.     gabapentin (NEURONTIN) 300 MG capsule Take 300 mg by mouth 3 (three) times daily.     hydrochlorothiazide (HYDRODIURIL) 25 MG tablet Take 25 mg by mouth daily.     hyoscyamine (LEVSIN SL) 0.125 MG SL tablet Place 1 tablet (0.125 mg total) under the tongue every 4 (four) hours as needed for cramping (abdominal pain). 30 tablet 0   losartan (COZAAR) 100 MG tablet Take 100 mg by mouth daily.     methimazole (TAPAZOLE) 5 MG tablet Take 1 tablet (5 mg total) by mouth daily. 30 tablet 3   metoCLOPramide (REGLAN) 10 MG tablet Take 1 tablet (10 mg total) by mouth every 8 (eight) hours as needed for nausea or vomiting. 30 tablet 0    NEXLETOL 180 MG TABS Take 180 mg by mouth daily. Pt taking 2-3 times a week     ondansetron (ZOFRAN-ODT) 4 MG disintegrating tablet Take 1 tablet (4 mg total) by mouth every 8 (eight) hours as needed for nausea or vomiting. 20 tablet 0   potassium chloride SA (KLOR-CON) 20 MEQ tablet Take 20 mEq by mouth 3 (three) times daily.     Probiotic Product (PROBIOTIC-10 PO) Take 1 capsule by mouth daily.     promethazine (PHENERGAN) 12.5 MG tablet Take 12.5 mg by mouth every 6 (six) hours as needed for nausea or vomiting.     vitamin B-12 (CYANOCOBALAMIN)  100 MCG tablet Take 100 mcg by mouth daily.     No facility-administered medications prior to visit.     Allergies:   Latex and Meclizine   Social History   Socioeconomic History   Marital status: Single    Spouse name: Not on file   Number of children: 2   Years of education: Not on file   Highest education level: 12th grade  Occupational History   Occupation: Custodian     Employer: [employer]  Tobacco Use   Smoking status: Former    Packs/day: 1.00    Years: 20.00    Total pack years: 20.00    Types: Cigarettes    Quit date: 2020    Years since quitting: 4.0   Smokeless tobacco: Never   Tobacco comments:    Quit smoking x 2 years    07/2015   SOMETIIMES i USE VAPOR   Vaping Use   Vaping Use: Never used  Substance and Sexual Activity   Alcohol use: Never   Drug use: Never   Sexual activity: Not Currently  Other Topics Concern   Not on file  Social History Narrative   Lives with friend   Divorced   No caffeine   Social Determinants of Health   Financial Resource Strain: Low Risk  (11/03/2020)   Overall Financial Resource Strain (CARDIA)    Difficulty of Paying Living Expenses: Not hard at all  Food Insecurity: No [place] (11/03/2020)   Hunger Vital Sign    Worried About Running Out of Food in the Last Year: Never true    [place] in the Last Year: Never true  Transportation Needs: No  Transportation Needs (11/03/2020)   PRAPARE - Hydrologist (Medical): No    Lack of Transportation (Non-Medical): No  Physical Activity: Inactive (11/03/2020)   Exercise Vital Sign    Days of Exercise per Week: 0 days    Minutes of Exercise per Session: 0 min  Stress: No Stress Concern Present (11/03/2020)   [place]    Feeling of Stress : Not at all  Social Connections: Socially Isolated (11/03/2020)   Social Connection and Isolation Panel [NHANES]    Frequency of Communication with Friends and Family: Twice a week    Frequency of Social Gatherings with Friends and Family: Once a week    Attends Religious Services: Never    Marine scientist or Organizations: No    Attends Music therapist: Never    Marital Status: Divorced     Family History:  The patient's family history includes Aneurysm in his mother; Prostate cancer in his father.   Review of Systems:   Please see the history of present illness.     General:  No chills, fever, night sweats or weight changes.  Cardiovascular:  No chest pain, dyspnea on exertion, edema, orthopnea, palpitations, paroxysmal nocturnal dyspnea. Dermatological: No rash, lesions/masses Respiratory: No cough, dyspnea Urologic: No hematuria, dysuria Abdominal:   No nausea, vomiting, diarrhea, melena, or hematemesis. Positive for BRBPR.  Neurologic:  No visual changes, wkns, changes in mental status. Positive for dizziness.    All other systems reviewed and are otherwise negative except as noted above.   Physical Exam:    VS:  There were no vitals taken for this visit.    Telephone no exam    Wt Readings from Last 3 Encounters:  08/13/22 195 lb  15.8 oz (88.9 kg)  08/10/22 196 lb (88.9 kg)  08/09/22 194 lb (88 kg)     Studies/Labs Reviewed:   EKG:  SR rate 70 normal  06/29/22   Recent Labs: 08/13/2022: ALT 39; B Natriuretic  Peptide 8.0; BUN <5; Creatinine, Ser 0.81; Hemoglobin 14.0; Platelets 242; Potassium 3.1; Sodium 136; TSH 0.030   Lipid Panel    Component Value Date/Time   CHOL 142 04/16/2020 0000   TRIG 74 04/16/2020 0000   HDL 48 04/16/2020 0000   CHOLHDL 4.4 03/02/2019 0329   VLDL 18 03/02/2019 0329   LDLCALC 79 04/16/2020 0000    Additional studies/ records that were reviewed today include:   Echocardiogram: 02/2019 IMPRESSIONS      1. The left ventricle has normal systolic function with an ejection fraction of 60-65%. The cavity size was normal. Left ventricular diastolic parameters were normal.  2. The right ventricle has normal systolic function. The cavity was normal. There is no increase in right ventricular wall thickness.  3. Mild thickening of the mitral valve leaflet.  4. The aortic valve is tricuspid. Mild thickening of the aortic valve. Mild calcification of the aortic valve.  5. The aorta is normal in size and structure.    Event Monitor: 03/2019 NSR  No significant arrhythmia No PAF  Myovue 06/25/19:  EF 62% soft tissue attenuation no ischemia   Assessment:    No diagnosis found.   Plan:   In order of problems listed above:  1. Paroxysmal Atrial Fibrillation/Flutter - Event monitor demonstrated no recurrence 04/09/19  but he was hospitalized at the end of 03/2019 for recurrent atrial fibrillation and spontaneously converted back to NSR. He continues to experience dizziness and I suspect this is not secondary to a cardiac etiology He had no improvement in his dizziness with decreasing his lopressor  Repeat monitor pending but has been in NSR on multiple recent ER visits At risk for recurrence given hyperthyroidism Continue coreg and eliquis   2. HTN - Well controlled.  Continue current medications and low sodium Dash type diet.    3. HLD - followed by PCP. ? Now on Nexlitol previously on statin   4. Dizziness/Chest Pain - he continues to have dizziness which is  being worked-up by Neurology and ENT. Had myovue 06/25/19 that was normal With no ischemia , soft tissue attenuation EF 62% low risk study. CT head 06/04/20 negative  He has had panic attacks in past and I suspect this is also playing a role in his frequent ER visits Given latter and 3 years since last stress test will f/u with lexiscan myovue Avoid cardiac CTA with iodinated contrast given hyperactive thyroid   5. Hyperthyroidism:  on beta blocker and tapazole f/u endocrine TSH still suppressed Latter started 08/02/22 Scan showed low normal radioactive iodine uptake No focal nodules F/U Dr Dorris Fetch   6. IBS:  on levsin and reglan chronic abdominal pain and nausea. Some gallstones but no obstruction f/u GI   F/u with cards in  6 months  Time:  spent reviewing chart recent [facility] Endocrine notes GI notes and CT scans patient interview and composing note 30 minutes     Signed, Jenkins Rouge, MD  08/16/2022 3:36 PM    [place]. [address] [place], [place] [address] Phone: [phone] Fax: [phone]

## 2022-08-17 ENCOUNTER — Encounter (HOSPITAL_COMMUNITY): Payer: Self-pay | Admitting: *Deleted

## 2022-08-17 ENCOUNTER — Emergency Department (HOSPITAL_COMMUNITY)
Admission: EM | Admit: 2022-08-17 | Discharge: 2022-08-17 | Disposition: A | Discharge status: Home / Self Care | Payer: 59 | Attending: Emergency Medicine | Admitting: Emergency Medicine

## 2022-08-17 ENCOUNTER — Other Ambulatory Visit: Payer: Self-pay | Admitting: "Endocrinology

## 2022-08-17 ENCOUNTER — Other Ambulatory Visit: Payer: Self-pay

## 2022-08-17 DIAGNOSIS — Z743 Need for continuous supervision: Secondary | ICD-10-CM | POA: Diagnosis not present

## 2022-08-17 DIAGNOSIS — R197 Diarrhea, unspecified: Secondary | ICD-10-CM

## 2022-08-17 DIAGNOSIS — D72819 Decreased white blood cell count, unspecified: Secondary | ICD-10-CM | POA: Diagnosis not present

## 2022-08-17 DIAGNOSIS — E039 Hypothyroidism, unspecified: Secondary | ICD-10-CM | POA: Insufficient documentation

## 2022-08-17 DIAGNOSIS — I10 Essential (primary) hypertension: Secondary | ICD-10-CM | POA: Diagnosis not present

## 2022-08-17 DIAGNOSIS — Z7901 Long term (current) use of anticoagulants: Secondary | ICD-10-CM | POA: Insufficient documentation

## 2022-08-17 DIAGNOSIS — Z91040 Latex allergy status: Secondary | ICD-10-CM | POA: Diagnosis not present

## 2022-08-17 DIAGNOSIS — I499 Cardiac arrhythmia, unspecified: Secondary | ICD-10-CM | POA: Diagnosis not present

## 2022-08-17 DIAGNOSIS — K529 Noninfective gastroenteritis and colitis, unspecified: Secondary | ICD-10-CM | POA: Diagnosis not present

## 2022-08-17 DIAGNOSIS — R531 Weakness: Secondary | ICD-10-CM | POA: Diagnosis not present

## 2022-08-17 DIAGNOSIS — Z79899 Other long term (current) drug therapy: Secondary | ICD-10-CM | POA: Insufficient documentation

## 2022-08-17 DIAGNOSIS — R110 Nausea: Secondary | ICD-10-CM | POA: Diagnosis not present

## 2022-08-17 DIAGNOSIS — K5900 Constipation, unspecified: Secondary | ICD-10-CM | POA: Diagnosis not present

## 2022-08-17 DIAGNOSIS — E876 Hypokalemia: Secondary | ICD-10-CM | POA: Diagnosis not present

## 2022-08-17 LAB — CBC WITH DIFFERENTIAL/PLATELET
Abs Immature Granulocytes: 0 10*3/uL (ref 0.00–0.07)
Basophils Absolute: 0 10*3/uL (ref 0.0–0.1)
Basophils Relative: 1 %
Eosinophils Absolute: 0.1 10*3/uL (ref 0.0–0.5)
Eosinophils Relative: 2 %
HCT: 41.4 % (ref 39.0–52.0)
Hemoglobin: 14 g/dL (ref 13.0–17.0)
Immature Granulocytes: 0 %
Lymphocytes Relative: 43 %
Lymphs Abs: 1.1 10*3/uL (ref 0.7–4.0)
MCH: 30.1 pg (ref 26.0–34.0)
MCHC: 33.8 g/dL (ref 30.0–36.0)
MCV: 89 fL (ref 80.0–100.0)
Monocytes Absolute: 0.3 10*3/uL (ref 0.1–1.0)
Monocytes Relative: 11 %
Neutro Abs: 1.1 10*3/uL — ABNORMAL LOW (ref 1.7–7.7)
Neutrophils Relative %: 43 %
Platelets: 239 10*3/uL (ref 150–400)
RBC: 4.65 MIL/uL (ref 4.22–5.81)
RDW: 12.9 % (ref 11.5–15.5)
WBC: 2.5 10*3/uL — ABNORMAL LOW (ref 4.0–10.5)
nRBC: 0 % (ref 0.0–0.2)

## 2022-08-17 LAB — URINALYSIS, ROUTINE W REFLEX MICROSCOPIC
Bilirubin Urine: NEGATIVE
Glucose, UA: NEGATIVE mg/dL
Hgb urine dipstick: NEGATIVE
Ketones, ur: NEGATIVE mg/dL
Leukocytes,Ua: NEGATIVE
Nitrite: NEGATIVE
Protein, ur: NEGATIVE mg/dL
Specific Gravity, Urine: 1.002 — ABNORMAL LOW (ref 1.005–1.030)
pH: 7 (ref 5.0–8.0)

## 2022-08-17 LAB — COMPREHENSIVE METABOLIC PANEL
ALT: 51 U/L — ABNORMAL HIGH (ref 0–44)
AST: 34 U/L (ref 15–41)
Albumin: 4.1 g/dL (ref 3.5–5.0)
Alkaline Phosphatase: 46 U/L (ref 38–126)
Anion gap: 10 (ref 5–15)
BUN: <5 mg/dL — ABNORMAL LOW (ref 8–23)
CO2: 27 mmol/L (ref 22–32)
Calcium: 9.9 mg/dL (ref 8.9–10.3)
Chloride: 100 mmol/L (ref 98–111)
Creatinine, Ser: 0.84 mg/dL (ref 0.61–1.24)
GFR, Estimated: >60 mL/min (ref >60)
Glucose, Bld: 113 mg/dL — ABNORMAL HIGH (ref 70–99)
Potassium: 3.3 mmol/L — ABNORMAL LOW (ref 3.5–5.1)
Sodium: 137 mmol/L (ref 135–145)
Total Bilirubin: 0.7 mg/dL (ref 0.3–1.2)
Total Protein: 7.3 g/dL (ref 6.5–8.1)

## 2022-08-17 LAB — AMMONIA: Ammonia: 30 umol/L (ref 9–35)

## 2022-08-17 LAB — LACTIC ACID, PLASMA: Lactic Acid, Venous: 1.1 mmol/L (ref 0.5–1.9)

## 2022-08-17 LAB — CBG MONITORING, ED: Glucose-Capillary: 119 mg/dL — ABNORMAL HIGH (ref 70–99)

## 2022-08-17 MED ORDER — SODIUM CHLORIDE 0.9 % IV SOLN: INTRAVENOUS | Status: DC

## 2022-08-17 MED ORDER — SODIUM CHLORIDE 0.9 % IV BOLUS
1000.0000 mL | Freq: Once | INTRAVENOUS | Status: AC
  Administered 2022-08-17: 1000 mL via INTRAVENOUS

## 2022-08-17 MED ORDER — POTASSIUM CHLORIDE 20 MEQ PO PACK
40.0000 meq | PACK | Freq: Once | ORAL | Status: AC
  Administered 2022-08-17: 40 meq via ORAL
  Filled 2022-08-17: qty 2

## 2022-08-17 NOTE — Telephone Encounter (Signed)
Pt said he will turn in his 24 hour urine tomorrow and will call us back so we can see his results and see what you want to do. He has already turned in the poop

## 2022-08-17 NOTE — ED Triage Notes (Addendum)
Pt brought in by [facility] from home with c/o generalized weakness, lightheaded, SOB and nausea going on for "awhile now" but worsening this morning when he was having a BM. Pt reports 3 BMs this morning. Pt also c/o chest heaviness.

## 2022-08-17 NOTE — Discharge Instructions (Signed)
Your potassium always tends to be on the low side.  I would like for you to take the medication which we have given you and continue the potassium at home.  Thankfully all of your other tests are normal, your vital signs are normal, your EKG was unremarkable, you will need to see your family doctor for ongoing evaluation.  Please make an appointment to be seen within the next several days

## 2022-08-17 NOTE — ED Provider Notes (Signed)
[facility] Provider Note   CSN: [id number] Arrival date & time: 08/17/22  9381     History  Chief Complaint  Patient presents with   Weakness    Steven [name] is a 72 y.o. male.   Weakness  This patient is a 72 year old male, he has a history of hypertension on amlodipine, carvedilol, hydrochlorothiazide, losartan as well as methimazole for recently diagnosed hyperthyroidism.  He is followed by multiple doctors including his family doctor locally, Dr. Dorris Fetch with the endocrinology service and general surgery, Dr. Constance Haw due to the patient's history of gallstones.  He has had multiple visits to the hospital recently, he was seen in December twice once for chest pain once for abdominal pain, he was seen in the emergency department multiple times in January including the ninth for feeling fatigued, the 18th for near syncope, the 21st for abdominal discomfort and he comes in today because of general fatigue.  His chief complaint is general weakness  The patient reports to me that he was sitting on his commode this morning tried to have a bowel movement, unfortunately he states that when he tries to bear down he has a feeling like he is get a pass out, this is fairly reproducible.  He does have a history of atrial fibrillation and takes Eliquis, he has placed his heart monitor on his chest but has not felt like his heart is been racing.  He states that he felt general weakness, all 4 extremities, there is no focality, no changes in vision, he does become nauseated when this happens.  He is not having the symptoms at this time.  The patient is frustrated by his frequent episodes of general weakness.  He has been taking his medications including his methimazole took it this morning    Home Medications Prior to Admission medications   Medication Sig Start Date End Date Taking? Authorizing Provider  alfuzosin (UROXATRAL) 10 MG 24 hr tablet Take 1  tablet (10 mg total) by mouth daily with breakfast. 04/07/22  Yes [name], Candee Furbish, MD  amLODipine (NORVASC) 10 MG tablet Take 1 tablet by mouth daily. 12/08/20  Yes [provider]  carvedilol (COREG) 6.25 MG tablet TAKE 1 TABLET(6.25 MG) BY MOUTH TWICE DAILY 07/21/22  Yes Josue Hector, MD  Cholecalciferol (VITAMIN D3) 125 MCG (5000 UT) CAPS Take 1 capsule by mouth daily.    Yes [provider]  ELIQUIS 5 MG TABS tablet TAKE 1 TABLET(5 MG) BY MOUTH TWICE DAILY Patient taking differently: Take 5 mg by mouth 2 (two) times daily. 04/24/22  Yes Josue Hector, MD  Erenumab-aooe (AIMOVIG) 70 MG/ML SOAJ Inject 1 mL into the skin every 30 (thirty) days.   Yes [provider]  gabapentin (NEURONTIN) 300 MG capsule Take 300 mg by mouth 3 (three) times daily. 02/24/20  Yes [provider]  hydrochlorothiazide (HYDRODIURIL) 25 MG tablet Take 25 mg by mouth daily. 07/28/19  Yes [provider]  losartan (COZAAR) 100 MG tablet Take 100 mg by mouth daily. 01/05/21  Yes [provider]  methimazole (TAPAZOLE) 5 MG tablet Take 1 tablet (5 mg total) by mouth daily. 08/02/22  Yes Nida, Marella Chimes, MD  metoCLOPramide (REGLAN) 10 MG tablet Take 1 tablet (10 mg total) by mouth every 8 (eight) hours as needed for nausea or vomiting. 08/10/22  Yes Audley Hose, MD  NEXLETOL 180 MG TABS Take 180 mg by mouth daily. Pt taking 2-3 times  a week 12/03/19  Yes [provider]  omeprazole (PRILOSEC) 20 MG capsule Take 20 mg by mouth daily.   Yes [provider]  ondansetron (ZOFRAN-ODT) 4 MG disintegrating tablet Take 1 tablet (4 mg total) by mouth every 8 (eight) hours as needed for nausea or vomiting. 08/01/22  Yes Kommor, [place], MD  potassium chloride SA (KLOR-CON) 20 MEQ tablet Take 20 mEq by mouth 3 (three) times daily. 12/09/19  Yes [provider]  Probiotic Product (PROBIOTIC-10 PO) Take 1 capsule by mouth daily.   Yes [provider]  vitamin B-12 (CYANOCOBALAMIN) 100 MCG tablet Take 100 mcg by mouth daily.   Yes [provider]  hyoscyamine (LEVSIN SL) 0.125 MG SL tablet Place 1 tablet (0.125 mg total) under the tongue every 4 (four) hours as needed for cramping (abdominal pain). Patient not taking: Reported on 08/17/2022 08/13/22   Steven Greek, MD  promethazine (PHENERGAN) 12.5 MG tablet Take 12.5 mg by mouth every 6 (six) hours as needed for nausea or vomiting.    [provider]      Allergies    Latex and Meclizine    Review of Systems   Review of Systems  Neurological:  Positive for weakness.  All other systems reviewed and are negative.   Physical Exam Updated Vital Signs BP (!) 112/90   Pulse 80   Temp 98.5 F (36.9 C) (Oral)   Resp 15   Ht 1.702 m ('5\' 7"'$ )   Wt 88.9 kg   SpO2 100%   BMI 30.70 kg/m  Physical Exam Vitals and nursing note reviewed.  Constitutional:      General: He is not in acute distress.    Appearance: He is well-developed.  HENT:     Head: Normocephalic and atraumatic.     Mouth/Throat:     Mouth: Mucous membranes are dry.     Pharynx: No oropharyngeal exudate.  Eyes:     General: No scleral icterus.       Right eye: No discharge.        Left eye: No discharge.     Conjunctiva/sclera: Conjunctivae normal.     Pupils: Pupils are equal, round, and reactive to light.  Neck:     Thyroid: No thyromegaly.     Vascular: No JVD.  Cardiovascular:     Rate and Rhythm: Normal rate and regular rhythm.     Heart sounds: Normal heart sounds. No murmur heard.    No friction rub. No gallop.  Pulmonary:     Effort: Pulmonary effort is normal. No respiratory distress.     Breath sounds: Normal breath sounds. No wheezing or rales.  Abdominal:     General: Bowel sounds are normal. There is no distension.     Palpations: Abdomen is soft. There is no mass.     Tenderness: There is no abdominal tenderness.  Musculoskeletal:        General: No  tenderness. Normal range of motion.     Cervical back: Normal range of motion and neck supple.     Right lower leg: No edema.     Left lower leg: No edema.  Lymphadenopathy:     Cervical: No cervical adenopathy.  Skin:    General: Skin is warm and dry.     Findings: No erythema or rash.  Neurological:     General: No focal deficit present.     Mental Status: He is alert.     Coordination: Coordination normal.  Comments: Cranial nerves III through XII are normal, the patient does not make good eye contact, he is able to speak without any difficulty, he is not slurring his words, his coordination is normal, his strength in all 4 extremities is totally normal including grips and ability to straight leg raise.  Normal sensation diffusely  Psychiatric:        Behavior: Behavior normal.     ED Results / Procedures / Treatments   Labs (all labs ordered are listed, but only abnormal results are displayed) Labs Reviewed  COMPREHENSIVE METABOLIC PANEL - Abnormal; Notable for the following components:      Result Value   Potassium 3.3 (*)    Glucose, Bld 113 (*)    BUN <5 (*)    ALT 51 (*)    All other components within normal limits  CBC WITH DIFFERENTIAL/PLATELET - Abnormal; Notable for the following components:   WBC 2.5 (*)    Neutro Abs 1.1 (*)    All other components within normal limits  URINALYSIS, ROUTINE W REFLEX MICROSCOPIC - Abnormal; Notable for the following components:   Color, Urine STRAW (*)    Specific Gravity, Urine 1.002 (*)    All other components within normal limits  CBG MONITORING, ED - Abnormal; Notable for the following components:   Glucose-Capillary 119 (*)    All other components within normal limits  AMMONIA  LACTIC ACID, PLASMA    EKG EKG Interpretation  Date/Time:  Thursday August 17 2022 10:12:45 EST Ventricular Rate:  75 PR Interval:  155 QRS Duration: 85 QT Interval:  371 QTC Calculation: 415 R Axis:   -19 Text Interpretation: Sinus  rhythm Borderline left axis deviation Low voltage, precordial leads Confirmed by Noemi Chapel [phone]) on 08/17/2022 10:17:39 AM  Radiology No results found.  Procedures Procedures    Medications Ordered in ED Medications  sodium chloride 0.9 % bolus 1,000 mL (0 mLs Intravenous Stopped 08/17/22 1114)    And  0.9 %  sodium chloride infusion ( Intravenous New Bag/Given 08/17/22 1138)  potassium chloride (KLOR-CON) packet 40 mEq (has no administration in time range)    ED Course/ Medical Decision Making/ A&P                             Medical Decision Making Amount and/or Complexity of Data Reviewed Labs: ordered.  Risk Prescription drug management.   This patient presents to the ED for concern of general weakness and near syncope with bowel movement, this involves an extensive number of treatment options, and is a complaint that carries with it a high risk of complications and morbidity.  The differential diagnosis includes possible constipation, vasovagal syncope, the patient has had some recent minor electrolyte abnormalities on multiple visits but has not had any significant findings on multiple different workups, currently taking medication that has been initiated for hyperthyroidism but is not tachycardic hypertensive or febrile   Co morbidities that complicate the patient evaluation  Hypertension, hypothyroidism, denies tobacco or alcohol use   Additional history obtained:  Additional history obtained from electronic medical record External records from outside source obtained and reviewed including chart review including multiple ER visits, outpatient visits to endocrinology   Lab Tests:  I Ordered, and personally interpreted labs.  The pertinent results include: Mild leukopenia, this is been seen on prior labs, mild hypokalemia also seen on prior labs   Cardiac Monitoring: / EKG:  The patient was maintained on a  cardiac monitor.  I personally viewed and  interpreted the cardiac monitored which showed an underlying rhythm of: No significant tachycardia, in fact his heart rate was normal the entire time     Problem List / ED Course / Critical interventions / Medication management  Potassium supplement ordered in the ER, he is on potassium at home I ordered medication including potassium for hypokalemia Reevaluation of the patient after these medicines showed that the patient improved I have reviewed the patients home medicines and have made adjustments as needed   Social Determinants of Health:  Patient is well-appearing and stable for discharge, I discussed all of his results with him, he is agreeable, he still has mild generalized weakness but nothing focal.  The patient has had multiple visits for same without definitive answer.  At this time I think he can follow-up in the outpatient setting, there is nothing focal to suggest the need for advanced neuroimaging or MRI.  He has normal mental status and is not encephalopathic or meningitic, he does not appear septic         Final Clinical Impression(s) / ED Diagnoses Final diagnoses:  Weakness  Hypokalemia    Rx / DC Orders ED Discharge Orders     None         Noemi Chapel, MD 08/17/22 1210

## 2022-08-17 NOTE — Telephone Encounter (Signed)
Dr Dorris Fetch Pt states he did labs yesterday with his PCP and they stated he needed to see you. He is still not feeling good, had a ER visit today. Please Advise.  I have printed labs from PCP. He is in the process of doing 24 hour urine

## 2022-08-17 NOTE — Telephone Encounter (Signed)
Noted  

## 2022-08-18 ENCOUNTER — Ambulatory Visit: Payer: 59 | Attending: Cardiovascular Disease

## 2022-08-18 DIAGNOSIS — R232 Flushing: Secondary | ICD-10-CM | POA: Diagnosis not present

## 2022-08-18 DIAGNOSIS — K529 Noninfective gastroenteritis and colitis, unspecified: Secondary | ICD-10-CM | POA: Diagnosis not present

## 2022-08-18 DIAGNOSIS — R109 Unspecified abdominal pain: Secondary | ICD-10-CM | POA: Diagnosis not present

## 2022-08-19 ENCOUNTER — Emergency Department (HOSPITAL_COMMUNITY)
Admission: EM | Admit: 2022-08-19 | Discharge: 2022-08-19 | Disposition: A | Discharge status: Home / Self Care | Payer: 59 | Attending: Emergency Medicine | Admitting: Emergency Medicine

## 2022-08-19 ENCOUNTER — Encounter (HOSPITAL_COMMUNITY): Payer: Self-pay

## 2022-08-19 DIAGNOSIS — Z91040 Latex allergy status: Secondary | ICD-10-CM | POA: Diagnosis not present

## 2022-08-19 DIAGNOSIS — R0789 Other chest pain: Secondary | ICD-10-CM | POA: Diagnosis not present

## 2022-08-19 DIAGNOSIS — Z79899 Other long term (current) drug therapy: Secondary | ICD-10-CM | POA: Insufficient documentation

## 2022-08-19 DIAGNOSIS — Z7901 Long term (current) use of anticoagulants: Secondary | ICD-10-CM | POA: Insufficient documentation

## 2022-08-19 DIAGNOSIS — I4891 Unspecified atrial fibrillation: Secondary | ICD-10-CM | POA: Insufficient documentation

## 2022-08-19 DIAGNOSIS — R404 Transient alteration of awareness: Secondary | ICD-10-CM | POA: Diagnosis not present

## 2022-08-19 DIAGNOSIS — E0590 Thyrotoxicosis, unspecified without thyrotoxic crisis or storm: Secondary | ICD-10-CM | POA: Insufficient documentation

## 2022-08-19 DIAGNOSIS — Z743 Need for continuous supervision: Secondary | ICD-10-CM | POA: Diagnosis not present

## 2022-08-19 DIAGNOSIS — R531 Weakness: Secondary | ICD-10-CM | POA: Diagnosis not present

## 2022-08-19 DIAGNOSIS — I10 Essential (primary) hypertension: Secondary | ICD-10-CM | POA: Insufficient documentation

## 2022-08-19 DIAGNOSIS — R002 Palpitations: Secondary | ICD-10-CM | POA: Diagnosis present

## 2022-08-19 DIAGNOSIS — R0602 Shortness of breath: Secondary | ICD-10-CM | POA: Diagnosis not present

## 2022-08-19 LAB — BASIC METABOLIC PANEL
Anion gap: 9 (ref 5–15)
BUN: <5 mg/dL — ABNORMAL LOW (ref 8–23)
CO2: 27 mmol/L (ref 22–32)
Calcium: 9.7 mg/dL (ref 8.9–10.3)
Chloride: 102 mmol/L (ref 98–111)
Creatinine, Ser: 0.81 mg/dL (ref 0.61–1.24)
GFR, Estimated: >60 mL/min (ref >60)
Glucose, Bld: 120 mg/dL — ABNORMAL HIGH (ref 70–99)
Potassium: 3.4 mmol/L — ABNORMAL LOW (ref 3.5–5.1)
Sodium: 138 mmol/L (ref 135–145)

## 2022-08-19 LAB — CBC WITH DIFFERENTIAL/PLATELET
Abs Immature Granulocytes: 0.01 10*3/uL (ref 0.00–0.07)
Basophils Absolute: 0 10*3/uL (ref 0.0–0.1)
Basophils Relative: 1 %
Eosinophils Absolute: 0.1 10*3/uL (ref 0.0–0.5)
Eosinophils Relative: 2 %
HCT: 39.9 % (ref 39.0–52.0)
Hemoglobin: 13.4 g/dL (ref 13.0–17.0)
Immature Granulocytes: 0 %
Lymphocytes Relative: 36 %
Lymphs Abs: 1 10*3/uL (ref 0.7–4.0)
MCH: 30 pg (ref 26.0–34.0)
MCHC: 33.6 g/dL (ref 30.0–36.0)
MCV: 89.3 fL (ref 80.0–100.0)
Monocytes Absolute: 0.3 10*3/uL (ref 0.1–1.0)
Monocytes Relative: 10 %
Neutro Abs: 1.5 10*3/uL — ABNORMAL LOW (ref 1.7–7.7)
Neutrophils Relative %: 51 %
Platelets: 222 10*3/uL (ref 150–400)
RBC: 4.47 MIL/uL (ref 4.22–5.81)
RDW: 12.8 % (ref 11.5–15.5)
WBC: 2.9 10*3/uL — ABNORMAL LOW (ref 4.0–10.5)
nRBC: 0 % (ref 0.0–0.2)

## 2022-08-19 LAB — PROTIME-INR
INR: 1.2 (ref 0.8–1.2)
Prothrombin Time: 15.5 seconds — ABNORMAL HIGH (ref 11.4–15.2)

## 2022-08-19 MED ORDER — METHIMAZOLE 5 MG PO TABS: 5.0000 mg | ORAL_TABLET | Freq: Two times a day (BID) | ORAL | 3 refills | Status: DC

## 2022-08-19 MED ORDER — LORAZEPAM 0.5 MG PO TABS
0.5000 mg | ORAL_TABLET | Freq: Once | ORAL | Status: AC
  Administered 2022-08-19: 0.5 mg via ORAL
  Filled 2022-08-19: qty 1

## 2022-08-19 NOTE — ED Provider Notes (Signed)
[facility] Provider Note   CSN: [id number] Arrival date & time: 08/19/22  1315     History  Chief Complaint  Patient presents with   multiple complaints    Steven [name] is a 72 y.o. male.  HPI   This patient is a 72 year old male, history of hyperthyroidism as well as hypertension, he is on Eliquis for atrial fibrillation, he presents to the hospital today with a complaint of multiple things including nausea, palpitations, shortness of breath and general weakness.  Essentially this is the same thing he has presented for multiple times this month.  He was actually seen by the endocrinologist locally and started on methimazole within the last month, based on the thyroid testing which I performed he has had a thyroid showing a TSH which is gradually decreasing.    Home Medications Prior to Admission medications   Medication Sig Start Date End Date Taking? Authorizing Provider  alfuzosin (UROXATRAL) 10 MG 24 hr tablet Take 1 tablet (10 mg total) by mouth daily with breakfast. 04/07/22   [name], Candee Furbish, MD  amLODipine (NORVASC) 10 MG tablet Take 1 tablet by mouth daily. 12/08/20   [provider]  carvedilol (COREG) 6.25 MG tablet TAKE 1 TABLET(6.25 MG) BY MOUTH TWICE DAILY 07/21/22   Josue Hector, MD  Cholecalciferol (VITAMIN D3) 125 MCG (5000 UT) CAPS Take 1 capsule by mouth daily.     [provider]  ELIQUIS 5 MG TABS tablet TAKE 1 TABLET(5 MG) BY MOUTH TWICE DAILY Patient taking differently: Take 5 mg by mouth 2 (two) times daily. 04/24/22   Josue Hector, MD  Erenumab-aooe (AIMOVIG) 70 MG/ML SOAJ Inject 1 mL into the skin every 30 (thirty) days.    [provider]  gabapentin (NEURONTIN) 300 MG capsule Take 300 mg by mouth 3 (three) times daily. 02/24/20   [provider]  hydrochlorothiazide (HYDRODIURIL) 25 MG tablet Take 25 mg by mouth daily. 07/28/19   [provider]  hyoscyamine  (LEVSIN SL) 0.125 MG SL tablet Place 1 tablet (0.125 mg total) under the tongue every 4 (four) hours as needed for cramping (abdominal pain). Patient not taking: Reported on 08/17/2022 08/13/22   [name], MD  losartan (COZAAR) 100 MG tablet Take 100 mg by mouth daily. 01/05/21   [provider]  methimazole (TAPAZOLE) 5 MG tablet Take 1 tablet (5 mg total) by mouth 2 (two) times daily. 08/19/22 09/18/22  Noemi Chapel, MD  metoCLOPramide (REGLAN) 10 MG tablet Take 1 tablet (10 mg total) by mouth every 8 (eight) hours as needed for nausea or vomiting. 08/10/22   Audley Hose, MD  NEXLETOL 180 MG TABS Take 180 mg by mouth daily. Pt taking 2-3 times a week 12/03/19   [provider]  omeprazole (PRILOSEC) 20 MG capsule Take 20 mg by mouth daily.    [provider]  ondansetron (ZOFRAN-ODT) 4 MG disintegrating tablet Take 1 tablet (4 mg total) by mouth every 8 (eight) hours as needed for nausea or vomiting. 08/01/22   Kommor, Madison, MD  potassium chloride SA (KLOR-CON) 20 MEQ tablet Take 20 mEq by mouth 3 (three) times daily. 12/09/19   [provider]  Probiotic Product (PROBIOTIC-10 PO) Take 1 capsule by mouth daily.    [provider]  promethazine (PHENERGAN) 12.5 MG tablet Take 12.5 mg by mouth every 6 (six) hours as needed for nausea or vomiting.    [provider]  vitamin B-12 (CYANOCOBALAMIN) 100 MCG tablet Take 100 mcg by mouth daily.    [provider]      Allergies    Latex and Meclizine    Review of Systems   Review of Systems  All other systems reviewed and are negative.   Physical Exam Updated Vital Signs BP 123/73   Pulse 73   Temp 98.3 F (36.8 C) (Oral)   Resp (!) 24   Ht 1.702 m ('5\' 7"'$ )   Wt 88.5 kg   SpO2 98%   BMI 30.54 kg/m  Physical Exam Vitals and nursing note reviewed.  Constitutional:      General: He is not in acute distress.    Appearance: He is well-developed.  HENT:     Head:  Normocephalic and atraumatic.     Mouth/Throat:     Pharynx: No oropharyngeal exudate.  Eyes:     General: No scleral icterus.       Right eye: No discharge.        Left eye: No discharge.     Conjunctiva/sclera: Conjunctivae normal.     Pupils: Pupils are equal, round, and reactive to light.  Neck:     Thyroid: No thyromegaly.     Vascular: No JVD.  Cardiovascular:     Rate and Rhythm: Normal rate and regular rhythm.     Heart sounds: Normal heart sounds. No murmur heard.    No friction rub. No gallop.  Pulmonary:     Effort: Pulmonary effort is normal. No respiratory distress.     Breath sounds: Normal breath sounds. No wheezing or rales.  Abdominal:     General: Bowel sounds are normal. There is no distension.     Palpations: Abdomen is soft. There is no mass.     Tenderness: There is no abdominal tenderness.  Musculoskeletal:        General: No tenderness. Normal range of motion.     Cervical back: Normal range of motion and neck supple.     Right lower leg: No edema.     Left lower leg: No edema.  Lymphadenopathy:     Cervical: No cervical adenopathy.  Skin:    General: Skin is warm and dry.     Findings: No erythema or rash.  Neurological:     General: No focal deficit present.     Mental Status: He is alert.     Coordination: Coordination normal.  Psychiatric:     Comments: This patient appears anxious     ED Results / Procedures / Treatments   Labs (all labs ordered are listed, but only abnormal results are displayed) Labs Reviewed  PROTIME-INR - Abnormal; Notable for the following components:      Result Value   Prothrombin Time 15.5 (*)    All other components within normal limits  CBC WITH DIFFERENTIAL/PLATELET - Abnormal; Notable for the following components:   WBC 2.9 (*)    Neutro Abs 1.5 (*)    All other components within normal limits  BASIC METABOLIC PANEL - Abnormal; Notable for the following components:   Potassium 3.4 (*)    Glucose, Bld 120  (*)    BUN <5 (*)    All other components within normal limits    EKG EKG Interpretation  Date/Time:  Saturday August 19 2022 13:27:36 EST Ventricular Rate:  77 PR Interval:  156 QRS Duration: 85 QT Interval:  369 QTC Calculation: 418 R Axis:   -21 Text Interpretation: Sinus rhythm Borderline  left axis deviation Low voltage, precordial leads Baseline wander in lead(s) V4 since last tracing no significant change Confirmed by Noemi Chapel [phone]) on 08/19/2022 2:25:43 PM  Radiology No results found.  Procedures Procedures    Medications Ordered in ED Medications  LORazepam (ATIVAN) tablet 0.5 mg (has no administration in time range)    ED Course/ Medical Decision Making/ A&P                             Medical Decision Making Amount and/or Complexity of Data Reviewed Labs: ordered.  Risk Prescription drug management.   Once again this patient presents in a somewhat overstimulated state.  That being said his heart rate is 77, he is very clearly in sinus rhythm on the EKG, review of his labs show that he has hyperthyroidism and his TSH continues to go with a downward trajectory.  He is not in thyroid storm, he is afebrile, he has normoxic, he is in normal sinus rhythm, he does not appear to be in any distress.  Unfortunately he continues to be overly anxious regarding the symptoms and I suspect there is nothing else organic that is driving this.  I will check some labs just to make sure that he is okay, at this point I think the patient will most likely be discharged  I have taken the opportunity at length to describe to him that he needs to see his endocrinologist for further treatment of his hyperthyroidism and that at this time there is nothing else that can be done in the emergency department setting  Basic metabolic panel and CBC are unremarkable  The patient is currently on once a day methimazole therapy, we will add a second dose per day and have the patient  follow-up with endocrinology.  He is very stable for discharge        Final Clinical Impression(s) / ED Diagnoses Final diagnoses:  Hyperthyroidism    Rx / DC Orders ED Discharge Orders          Ordered    methimazole (TAPAZOLE) 5 MG tablet  2 times daily        08/19/22 1440              Noemi Chapel, MD 08/19/22 1445

## 2022-08-19 NOTE — ED Triage Notes (Signed)
Pt to ED c/o multiple complaints x months. Pt reports nausea , shob, "heart fluttering" , hand pain, headache. Reports recently seen in ED for the same.

## 2022-08-19 NOTE — Discharge Instructions (Signed)
Please start taking your thyroid medication, methimazole twice a day.  All your test were reassuring and unremarkable, we know that your thyroid is likely the cause of your symptoms so you will need to follow-up with your endocrinologist Dr. Dorris Fetch this coming week.  Please make a phone call on Monday morning to arrange this.  You may return to the ER for severe worsening symptoms

## 2022-08-20 ENCOUNTER — Emergency Department (HOSPITAL_COMMUNITY): Payer: 59

## 2022-08-20 ENCOUNTER — Other Ambulatory Visit: Payer: Self-pay

## 2022-08-20 ENCOUNTER — Emergency Department (HOSPITAL_COMMUNITY)
Admission: EM | Admit: 2022-08-20 | Discharge: 2022-08-20 | Disposition: A | Discharge status: Home / Self Care | Payer: 59 | Attending: Emergency Medicine | Admitting: Emergency Medicine

## 2022-08-20 DIAGNOSIS — R079 Chest pain, unspecified: Secondary | ICD-10-CM | POA: Diagnosis not present

## 2022-08-20 DIAGNOSIS — Z91040 Latex allergy status: Secondary | ICD-10-CM | POA: Insufficient documentation

## 2022-08-20 DIAGNOSIS — R002 Palpitations: Secondary | ICD-10-CM | POA: Diagnosis not present

## 2022-08-20 LAB — CBC WITH DIFFERENTIAL/PLATELET
Abs Immature Granulocytes: 0 10*3/uL (ref 0.00–0.07)
Basophils Absolute: 0 10*3/uL (ref 0.0–0.1)
Basophils Relative: 1 %
Eosinophils Absolute: 0.1 10*3/uL (ref 0.0–0.5)
Eosinophils Relative: 2 %
HCT: 40.3 % (ref 39.0–52.0)
Hemoglobin: 13.4 g/dL (ref 13.0–17.0)
Immature Granulocytes: 0 %
Lymphocytes Relative: 43 %
Lymphs Abs: 1.6 10*3/uL (ref 0.7–4.0)
MCH: 29.8 pg (ref 26.0–34.0)
MCHC: 33.3 g/dL (ref 30.0–36.0)
MCV: 89.8 fL (ref 80.0–100.0)
Monocytes Absolute: 0.3 10*3/uL (ref 0.1–1.0)
Monocytes Relative: 8 %
Neutro Abs: 1.8 10*3/uL (ref 1.7–7.7)
Neutrophils Relative %: 46 %
Platelets: 249 10*3/uL (ref 150–400)
RBC: 4.49 MIL/uL (ref 4.22–5.81)
RDW: 12.7 % (ref 11.5–15.5)
WBC: 3.8 10*3/uL — ABNORMAL LOW (ref 4.0–10.5)
nRBC: 0 % (ref 0.0–0.2)

## 2022-08-20 LAB — COMPREHENSIVE METABOLIC PANEL
ALT: 38 U/L (ref 0–44)
AST: 28 U/L (ref 15–41)
Albumin: 3.6 g/dL (ref 3.5–5.0)
Alkaline Phosphatase: 39 U/L (ref 38–126)
Anion gap: 10 (ref 5–15)
BUN: <5 mg/dL — ABNORMAL LOW (ref 8–23)
CO2: 25 mmol/L (ref 22–32)
Calcium: 9.8 mg/dL (ref 8.9–10.3)
Chloride: 102 mmol/L (ref 98–111)
Creatinine, Ser: 0.86 mg/dL (ref 0.61–1.24)
GFR, Estimated: >60 mL/min (ref >60)
Glucose, Bld: 106 mg/dL — ABNORMAL HIGH (ref 70–99)
Potassium: 3.3 mmol/L — ABNORMAL LOW (ref 3.5–5.1)
Sodium: 137 mmol/L (ref 135–145)
Total Bilirubin: 0.6 mg/dL (ref 0.3–1.2)
Total Protein: 6.7 g/dL (ref 6.5–8.1)

## 2022-08-20 LAB — URINALYSIS, ROUTINE W REFLEX MICROSCOPIC
Bilirubin Urine: NEGATIVE
Glucose, UA: NEGATIVE mg/dL
Hgb urine dipstick: NEGATIVE
Ketones, ur: NEGATIVE mg/dL
Leukocytes,Ua: NEGATIVE
Nitrite: NEGATIVE
Protein, ur: NEGATIVE mg/dL
Specific Gravity, Urine: 1.004 — ABNORMAL LOW (ref 1.005–1.030)
pH: 6 (ref 5.0–8.0)

## 2022-08-20 LAB — MAGNESIUM: Magnesium: 1.9 mg/dL (ref 1.7–2.4)

## 2022-08-20 LAB — TROPONIN I (HIGH SENSITIVITY)
Troponin I (High Sensitivity): 14 ng/L (ref <18)
Troponin I (High Sensitivity): 17 ng/L (ref <18)

## 2022-08-20 LAB — LIPASE, BLOOD: Lipase: 29 U/L (ref 11–51)

## 2022-08-20 MED ORDER — ONDANSETRON 4 MG PO TBDP
4.0000 mg | ORAL_TABLET | Freq: Once | ORAL | Status: AC
  Administered 2022-08-20: 4 mg via ORAL
  Filled 2022-08-20: qty 1

## 2022-08-20 MED ORDER — NAPROXEN 250 MG PO TABS
375.0000 mg | ORAL_TABLET | Freq: Once | ORAL | Status: DC
  Filled 2022-08-20: qty 2

## 2022-08-20 MED ORDER — ACETAMINOPHEN 325 MG PO TABS
650.0000 mg | ORAL_TABLET | Freq: Once | ORAL | Status: AC
  Administered 2022-08-20: 650 mg via ORAL
  Filled 2022-08-20: qty 2

## 2022-08-20 MED ORDER — POTASSIUM CHLORIDE CRYS ER 20 MEQ PO TBCR
40.0000 meq | EXTENDED_RELEASE_TABLET | Freq: Once | ORAL | Status: AC
  Administered 2022-08-20: 40 meq via ORAL
  Filled 2022-08-20: qty 2

## 2022-08-20 NOTE — ED Notes (Signed)
Pt ambulated to restroom without incident.

## 2022-08-20 NOTE — ED Provider Notes (Signed)
[facility] Provider Note   CSN: [id number] Arrival date & time: 08/20/22  0000     History  No chief complaint on file.   Steven [name] is a 72 y.o. male.  HPI    72 year old male comes in with chief complaint of nausea, vomiting, intermittent episodes of palpitations with a singular episode of near syncope after being discharged from the ER any pain last night.  Patient states that for the last several weeks he has been having issues with abdominal pain.  He is cramping abdominal pain with nausea.  He is having hot sweats that has progressed over the last few weeks.  He is also having episodes of palpitations and shortness of breath.  He was seen by endocrinologist and they are adjusting his medications. He was seen by cardiologist and there is a cardiac monitor that he is wearing. Symptoms when they come, are stressful and he decided to come to [facility] ER instead of [facility] emergency room where he is gone in the past to see if we have any additional answers or solutions. Home Medications Prior to Admission medications   Medication Sig Start Date End Date Taking? Authorizing Provider  alfuzosin (UROXATRAL) 10 MG 24 hr tablet Take 1 tablet (10 mg total) by mouth daily with breakfast. 04/07/22   [name], Candee Furbish, MD  amLODipine (NORVASC) 10 MG tablet Take 1 tablet by mouth daily. 12/08/20   [provider]  carvedilol (COREG) 6.25 MG tablet TAKE 1 TABLET(6.25 MG) BY MOUTH TWICE DAILY 07/21/22   Josue Hector, MD  Cholecalciferol (VITAMIN D3) 125 MCG (5000 UT) CAPS Take 1 capsule by mouth daily.     [provider]  ELIQUIS 5 MG TABS tablet TAKE 1 TABLET(5 MG) BY MOUTH TWICE DAILY Patient taking differently: Take 5 mg by mouth 2 (two) times daily. 04/24/22   Josue Hector, MD  Erenumab-aooe (AIMOVIG) 70 MG/ML SOAJ Inject 1 mL into the skin every 30 (thirty) days.    [provider]  gabapentin  (NEURONTIN) 300 MG capsule Take 300 mg by mouth 3 (three) times daily. 02/24/20   [provider]  hydrochlorothiazide (HYDRODIURIL) 25 MG tablet Take 25 mg by mouth daily. 07/28/19   [provider]  hyoscyamine (LEVSIN SL) 0.125 MG SL tablet Place 1 tablet (0.125 mg total) under the tongue every 4 (four) hours as needed for cramping (abdominal pain). Patient not taking: Reported on 08/17/2022 08/13/22   [name], MD  losartan (COZAAR) 100 MG tablet Take 100 mg by mouth daily. 01/05/21   [provider]  methimazole (TAPAZOLE) 5 MG tablet Take 1 tablet (5 mg total) by mouth 2 (two) times daily. 08/19/22 09/18/22  Noemi Chapel, MD  metoCLOPramide (REGLAN) 10 MG tablet Take 1 tablet (10 mg total) by mouth every 8 (eight) hours as needed for nausea or vomiting. 08/10/22   Audley Hose, MD  NEXLETOL 180 MG TABS Take 180 mg by mouth daily. Pt taking 2-3 times a week 12/03/19   [provider]  omeprazole (PRILOSEC) 20 MG capsule Take 20 mg by mouth daily.    [provider]  ondansetron (ZOFRAN-ODT) 4 MG disintegrating tablet Take 1 tablet (4 mg total) by mouth every 8 (eight) hours as needed for nausea or vomiting. 08/01/22   Kommor, Madison, MD  potassium chloride SA (KLOR-CON) 20 MEQ tablet Take 20 mEq by mouth 3 (three) times daily. 12/09/19   [provider]  Probiotic Product (PROBIOTIC-10 PO) Take 1 capsule by mouth daily.    [provider]  promethazine (PHENERGAN) 12.5 MG tablet Take 12.5 mg by mouth every 6 (six) hours as needed for nausea or vomiting.    [provider]  vitamin B-12 (CYANOCOBALAMIN) 100 MCG tablet Take 100 mcg by mouth daily.    [provider]      Allergies    Latex and Meclizine    Review of Systems   Review of Systems  All other systems reviewed and are negative.   Physical Exam Updated Vital Signs BP 120/78 (BP Location: Left Arm)   Pulse 80   Temp 98.3 F (36.8 C)   Resp  18   SpO2 98%  Physical Exam Vitals and nursing note reviewed.  Constitutional:      Appearance: He is well-developed.  HENT:     Head: Atraumatic.  Cardiovascular:     Rate and Rhythm: Normal rate.  Pulmonary:     Effort: Pulmonary effort is normal.  Musculoskeletal:     Cervical back: Neck supple.  Skin:    General: Skin is warm.  Neurological:     Mental Status: He is alert and oriented to person, place, and time.     ED Results / Procedures / Treatments   Labs (all labs ordered are listed, but only abnormal results are displayed) Labs Reviewed  COMPREHENSIVE METABOLIC PANEL - Abnormal; Notable for the following components:      Result Value   Potassium 3.3 (*)    Glucose, Bld 106 (*)    BUN <5 (*)    All other components within normal limits  CBC WITH DIFFERENTIAL/PLATELET - Abnormal; Notable for the following components:   WBC 3.8 (*)    All other components within normal limits  URINALYSIS, ROUTINE W REFLEX MICROSCOPIC - Abnormal; Notable for the following components:   Specific Gravity, Urine 1.004 (*)    All other components within normal limits  LIPASE, BLOOD  MAGNESIUM  TROPONIN I (HIGH SENSITIVITY)  TROPONIN I (HIGH SENSITIVITY)    EKG EKG Interpretation  Date/Time:  Sunday August 20 2022 00:33:12 EST Ventricular Rate:  89 PR Interval:  156 QRS Duration: 78 QT Interval:  342 QTC Calculation: 416 R Axis:   2 Text Interpretation: Sinus rhythm with Premature atrial complexes Otherwise normal ECG When compared with ECG of 19-Aug-2022 13:27, PREVIOUS ECG IS PRESENT No acute changes No significant change since last tracing Confirmed by Varney Biles ([id number]) on 08/20/2022 10:54:44 AM  Radiology DG Chest 2 View  Result Date: 08/20/2022 CLINICAL DATA:  Chest pain EXAM: CHEST - 2 VIEW COMPARISON:  08/10/2022 FINDINGS: Cardiac shadow is stable. Lungs are clear bilaterally. Some chronic scarring in the left base is noted. No bony abnormality is seen.  IMPRESSION: No acute abnormality noted. Electronically Signed   By: Inez Catalina M.D.   On: 08/20/2022 01:27    Procedures Procedures    Medications Ordered in ED Medications  potassium chloride SA (KLOR-CON M) CR tablet 40 mEq (has no administration in time range)  ondansetron (ZOFRAN-ODT) disintegrating tablet 4 mg (4 mg Oral Given 08/20/22 0045)  ondansetron (ZOFRAN-ODT) disintegrating tablet 4 mg (4 mg Oral Given 08/20/22 1021)     ED Course/ Medical Decision Making/ A&P                             Medical Decision Making Risk Prescription drug management.  71 year old  male with history of atrial fibrillation, thyroid disorder, IBS comes in with chief complaint of nausea, vomiting, abdominal cramping, intermittent episodes of palpitations, shortness of breath and hot sweats.  Currently is hemodynamically stable with vital signs within normal limit.  Patient does not appear toxic.  He is seeing a cardiologist and has a cardiac monitor on.  He recently saw an endocrinologist and there were adjustments made to his thyroid medications.  Differential diagnosis includes poorly controlled thyroid disease leading to systemic symptoms and also perhaps disturbing his A-fib. Patient has been seen in the ER multiple times in the last few weeks and he has had a CT abdomen and pelvis, CT chest and cardiac workup that has been reassuring.  Currently is comfortable appearing.  I do not think any additional workup is needed.  His labs reveal mild hypokalemia.  Will give him oral potassium here.  I advised, what was advised to him yesterday by emergency physician that he needs to contact both his PCP and endocrinologist to see if many of his symptoms can be explained by poorly controlled thyroid disease.  This will allow for even the cardiologist to make a sound decision once the cardiac monitor is turned in.  Patient was reassured with the discussion of results in the ER today along with CT scan  of the abdomen, CT scan of the chest that he had recently.  He is comfortable going home.  Final Clinical Impression(s) / ED Diagnoses Final diagnoses:  Palpitations    Rx / DC Orders ED Discharge Orders     None         Varney Biles, MD 08/20/22 1057

## 2022-08-20 NOTE — Discharge Instructions (Signed)
You are seen in the emergency room for palpitations, nausea and shortness of breath.  Your labs besides mild hypokalemia are reassuring.  EKG here does not show any concerning findings.  Your vital signs are stable and within normal limits.  As discussed, please discussed your concerns with your primary care doctor and endocrinologist. Also discussed the palpitation symptoms with your cardiologist on February 2.

## 2022-08-20 NOTE — ED Provider Triage Note (Signed)
  Emergency Medicine Provider Triage Evaluation Note  MRN:  [id number]  Arrival date & time: 08/20/22    Medically screening exam initiated at 12:43 AM.   CC:   SOB and nausea  HPI:  Steven [name] is a 72 y.o. year-old male presents to the ED with chief complaint of nausea and vomiting for the past couple of months.  Multiple visits.  Seen today at [facility] for the same.  States that he got home and felt like his heart was racing and that he was going to pass out.  History provided by patient. ROS:  -As included in HPI PE:   Vitals:   08/20/22 0006  BP: 121/83  Pulse: 94  Resp: 19  Temp: 98.4 F (36.9 C)  SpO2: 96%    Non-toxic appearing No respiratory distress  MDM:   I've ordered labs and imaging in triage to expedite lab/diagnostic workup.  Patient was informed that the remainder of the evaluation will be completed by another provider, this initial triage assessment does not replace that evaluation, and the importance of remaining in the ED until their evaluation is complete.    Montine Circle, PA-C 08/20/22 0045

## 2022-08-20 NOTE — ED Triage Notes (Signed)
Patient reports persistent palpitations with chest tightness , nausea and headache for several months . His cardiologist is Dr. Johnsie Cancel.

## 2022-08-21 ENCOUNTER — Encounter (HOSPITAL_COMMUNITY): Payer: Self-pay

## 2022-08-21 ENCOUNTER — Observation Stay (HOSPITAL_COMMUNITY)
Admission: EM | Admit: 2022-08-21 | Discharge: 2022-08-23 | Disposition: A | Discharge status: Home / Self Care | Payer: 59 | Attending: Internal Medicine | Admitting: Internal Medicine

## 2022-08-21 ENCOUNTER — Ambulatory Visit: Payer: Medicare Other | Admitting: "Endocrinology

## 2022-08-21 ENCOUNTER — Other Ambulatory Visit: Payer: Self-pay

## 2022-08-21 ENCOUNTER — Emergency Department (HOSPITAL_COMMUNITY): Payer: 59

## 2022-08-21 ENCOUNTER — Telehealth (INDEPENDENT_AMBULATORY_CARE_PROVIDER_SITE_OTHER): Payer: Self-pay | Admitting: *Deleted

## 2022-08-21 DIAGNOSIS — C61 Malignant neoplasm of prostate: Secondary | ICD-10-CM | POA: Diagnosis present

## 2022-08-21 DIAGNOSIS — G4730 Sleep apnea, unspecified: Secondary | ICD-10-CM | POA: Diagnosis present

## 2022-08-21 DIAGNOSIS — Z8546 Personal history of malignant neoplasm of prostate: Secondary | ICD-10-CM | POA: Insufficient documentation

## 2022-08-21 DIAGNOSIS — F419 Anxiety disorder, unspecified: Secondary | ICD-10-CM | POA: Diagnosis present

## 2022-08-21 DIAGNOSIS — E876 Hypokalemia: Secondary | ICD-10-CM | POA: Diagnosis not present

## 2022-08-21 DIAGNOSIS — E0590 Thyrotoxicosis, unspecified without thyrotoxic crisis or storm: Secondary | ICD-10-CM | POA: Diagnosis not present

## 2022-08-21 DIAGNOSIS — R0789 Other chest pain: Secondary | ICD-10-CM | POA: Diagnosis not present

## 2022-08-21 DIAGNOSIS — R404 Transient alteration of awareness: Secondary | ICD-10-CM | POA: Diagnosis not present

## 2022-08-21 DIAGNOSIS — R6889 Other general symptoms and signs: Secondary | ICD-10-CM | POA: Diagnosis not present

## 2022-08-21 DIAGNOSIS — Z87891 Personal history of nicotine dependence: Secondary | ICD-10-CM | POA: Insufficient documentation

## 2022-08-21 DIAGNOSIS — Z79899 Other long term (current) drug therapy: Secondary | ICD-10-CM | POA: Diagnosis not present

## 2022-08-21 DIAGNOSIS — Z91040 Latex allergy status: Secondary | ICD-10-CM | POA: Diagnosis not present

## 2022-08-21 DIAGNOSIS — R000 Tachycardia, unspecified: Secondary | ICD-10-CM | POA: Diagnosis not present

## 2022-08-21 DIAGNOSIS — R0602 Shortness of breath: Secondary | ICD-10-CM | POA: Diagnosis not present

## 2022-08-21 DIAGNOSIS — Z743 Need for continuous supervision: Secondary | ICD-10-CM | POA: Diagnosis not present

## 2022-08-21 DIAGNOSIS — I480 Paroxysmal atrial fibrillation: Secondary | ICD-10-CM | POA: Diagnosis not present

## 2022-08-21 DIAGNOSIS — I4719 Other supraventricular tachycardia: Secondary | ICD-10-CM | POA: Diagnosis not present

## 2022-08-21 DIAGNOSIS — G4733 Obstructive sleep apnea (adult) (pediatric): Secondary | ICD-10-CM | POA: Diagnosis present

## 2022-08-21 DIAGNOSIS — I471 Supraventricular tachycardia, unspecified: Principal | ICD-10-CM | POA: Insufficient documentation

## 2022-08-21 DIAGNOSIS — I4891 Unspecified atrial fibrillation: Secondary | ICD-10-CM | POA: Diagnosis not present

## 2022-08-21 DIAGNOSIS — I4892 Unspecified atrial flutter: Secondary | ICD-10-CM | POA: Diagnosis not present

## 2022-08-21 DIAGNOSIS — Z7901 Long term (current) use of anticoagulants: Secondary | ICD-10-CM | POA: Insufficient documentation

## 2022-08-21 DIAGNOSIS — E039 Hypothyroidism, unspecified: Secondary | ICD-10-CM | POA: Diagnosis not present

## 2022-08-21 DIAGNOSIS — I10 Essential (primary) hypertension: Secondary | ICD-10-CM | POA: Diagnosis present

## 2022-08-21 DIAGNOSIS — R634 Abnormal weight loss: Secondary | ICD-10-CM | POA: Diagnosis present

## 2022-08-21 DIAGNOSIS — I499 Cardiac arrhythmia, unspecified: Secondary | ICD-10-CM | POA: Diagnosis not present

## 2022-08-21 LAB — BASIC METABOLIC PANEL
Anion gap: 11 (ref 5–15)
BUN: <5 mg/dL — ABNORMAL LOW (ref 8–23)
CO2: 24 mmol/L (ref 22–32)
Calcium: 9.5 mg/dL (ref 8.9–10.3)
Chloride: 103 mmol/L (ref 98–111)
Creatinine, Ser: 0.75 mg/dL (ref 0.61–1.24)
GFR, Estimated: >60 mL/min (ref >60)
Glucose, Bld: 123 mg/dL — ABNORMAL HIGH (ref 70–99)
Potassium: 3.2 mmol/L — ABNORMAL LOW (ref 3.5–5.1)
Sodium: 138 mmol/L (ref 135–145)

## 2022-08-21 LAB — CBC
HCT: 41.6 % (ref 39.0–52.0)
Hemoglobin: 13.8 g/dL (ref 13.0–17.0)
MCH: 29.6 pg (ref 26.0–34.0)
MCHC: 33.2 g/dL (ref 30.0–36.0)
MCV: 89.1 fL (ref 80.0–100.0)
Platelets: 239 10*3/uL (ref 150–400)
RBC: 4.67 MIL/uL (ref 4.22–5.81)
RDW: 12.7 % (ref 11.5–15.5)
WBC: 3.2 10*3/uL — ABNORMAL LOW (ref 4.0–10.5)
nRBC: 0 % (ref 0.0–0.2)

## 2022-08-21 LAB — T4, FREE: Free T4: 2.16 ng/dL — ABNORMAL HIGH (ref 0.61–1.12)

## 2022-08-21 MED ORDER — METOPROLOL TARTRATE 5 MG/5ML IV SOLN: 5.0000 mg | Freq: Three times a day (TID) | INTRAVENOUS | Status: DC | PRN

## 2022-08-21 MED ORDER — POTASSIUM CHLORIDE CRYS ER 20 MEQ PO TBCR
20.0000 meq | EXTENDED_RELEASE_TABLET | Freq: Three times a day (TID) | ORAL | Status: DC
  Administered 2022-08-21 – 2022-08-23 (×5): 20 meq via ORAL
  Filled 2022-08-21 (×5): qty 1

## 2022-08-21 MED ORDER — ACETAMINOPHEN 325 MG PO TABS
650.0000 mg | ORAL_TABLET | Freq: Four times a day (QID) | ORAL | Status: DC | PRN
  Administered 2022-08-22: 650 mg via ORAL
  Filled 2022-08-21: qty 2

## 2022-08-21 MED ORDER — METOPROLOL TARTRATE 50 MG PO TABS
50.0000 mg | ORAL_TABLET | Freq: Two times a day (BID) | ORAL | Status: DC
  Administered 2022-08-21 – 2022-08-23 (×4): 50 mg via ORAL
  Filled 2022-08-21 (×4): qty 1

## 2022-08-21 MED ORDER — POLYETHYLENE GLYCOL 3350 17 G PO PACK
17.0000 g | PACK | Freq: Every day | ORAL | Status: DC | PRN
  Administered 2022-08-21: 17 g via ORAL
  Filled 2022-08-21: qty 1

## 2022-08-21 MED ORDER — PNEUMOCOCCAL 20-VAL CONJ VACC 0.5 ML IM SUSY
0.5000 mL | PREFILLED_SYRINGE | INTRAMUSCULAR | Status: DC
  Filled 2022-08-21: qty 0.5

## 2022-08-21 MED ORDER — METHIMAZOLE 5 MG PO TABS
5.0000 mg | ORAL_TABLET | Freq: Two times a day (BID) | ORAL | Status: DC
  Administered 2022-08-21 – 2022-08-22 (×2): 5 mg via ORAL
  Filled 2022-08-21 (×2): qty 1

## 2022-08-21 MED ORDER — ACETAMINOPHEN 650 MG RE SUPP: 650.0000 mg | Freq: Four times a day (QID) | RECTAL | Status: DC | PRN

## 2022-08-21 MED ORDER — TRAZODONE HCL 50 MG PO TABS
50.0000 mg | ORAL_TABLET | Freq: Every evening | ORAL | Status: DC | PRN
  Administered 2022-08-21 – 2022-08-22 (×2): 50 mg via ORAL
  Filled 2022-08-21 (×2): qty 1

## 2022-08-21 MED ORDER — POTASSIUM CHLORIDE 10 MEQ/100ML IV SOLN
10.0000 meq | Freq: Once | INTRAVENOUS | Status: AC
  Administered 2022-08-21: 10 meq via INTRAVENOUS
  Filled 2022-08-21: qty 100

## 2022-08-21 MED ORDER — APIXABAN 5 MG PO TABS
5.0000 mg | ORAL_TABLET | Freq: Two times a day (BID) | ORAL | Status: DC
  Administered 2022-08-21 – 2022-08-23 (×4): 5 mg via ORAL
  Filled 2022-08-21 (×4): qty 1

## 2022-08-21 MED ORDER — SODIUM CHLORIDE 0.9 % IV SOLN: INTRAVENOUS | Status: AC

## 2022-08-21 MED ORDER — METOPROLOL TARTRATE 5 MG/5ML IV SOLN
5.0000 mg | Freq: Once | INTRAVENOUS | Status: AC
  Administered 2022-08-21: 5 mg via INTRAVENOUS
  Filled 2022-08-21: qty 5

## 2022-08-21 MED ORDER — ONDANSETRON HCL 4 MG PO TABS
4.0000 mg | ORAL_TABLET | Freq: Four times a day (QID) | ORAL | Status: DC | PRN
  Administered 2022-08-22: 4 mg via ORAL
  Filled 2022-08-21 (×2): qty 1

## 2022-08-21 MED ORDER — ONDANSETRON HCL 4 MG/2ML IJ SOLN: 4.0000 mg | Freq: Four times a day (QID) | INTRAMUSCULAR | Status: DC | PRN

## 2022-08-21 NOTE — Consult Note (Addendum)
CARDIOLOGY CONSULT NOTE    Patient ID: Steven [name]; [id number]; 1951/06/17   Admit date: 08/21/2022 Date of Consult: 08/21/2022  Primary Care Provider: Redmond School, MD Primary Cardiologist:  Primary Electrophysiologist:     Patient Profile:   Steven [name] is a 72 y.o. male with a hx of paroxysmal atrial flutter diagnosed in 2020, HTN, hyperthyroidism, prostate cancer s/p radiation therapy in 2013 who is being seen today for the evaluation of SVT at the request of Dr. Alvino Chapel.  History of Present Illness:   Steven [name] is a 72 year old M known to have paroxysmal atrial flutter diagnosed in 2020, HTN, hyperthyroidism, prostate cancer s/p radiation therapy in 2013 presents to the ER with chief complaints of dizziness/nausea, SOB and monitor showed SVT in the EMS. His blood pressure was soft, 80s mm Hg SBP due to which initially DCCV was encouraged but patient spontaneously converted to NSR with belching. He reported that he had multiple episodes of similar complaints in the past since November 2023 associated with loss of appetite, weight loss, diarrhea and sporadic episodes of SOB and noted to be in elevated heart rates. He has been in and out of ER multiple times since 05/2022.  EKG showed narrow complex tachycardia, likely SVT.  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  He has been following up with endocrinology for management of his hyperthyroidism.  Patient also complained of chest tightness since 05/2022 and noticing it occurring more frequently recently almost every day.  He also was noticing left arm/shoulder numbness.  Chest tightness and left arm numbness does not occur at the same time.  He underwent Lexiscan in 2020, low risk study.  Past Medical History:  Diagnosis Date   Anxiety    Arthritis    Atrial fibrillation (HCC)    Atrial flutter (HCC)    BPH (benign prostatic hyperplasia)    Chest pain    Dizziness    Dyspnea    laying down occ   Fracture 08/17/2015    MULTIPLE RIB FRACTURES     FROM FALL    GERD (gastroesophageal reflux disease)    Hemorrhoids    History of kidney stones    noted on CT scan   History of radiation therapy 08/22/11-10/13/11   prostate   Hyperlipemia    Hypertension    Hyperthyroidism    IBS (irritable bowel syndrome)    Insomnia    Light headedness    Migraine    Numbness and tingling in left arm    Numbness and tingling of both legs    Open fracture of left elbow 08/18/2015   Prostate cancer [facility])    prostate s/p radiation Mar 2013   Rib fractures 08/17/2015   Tinnitus     Past Surgical History:  Procedure Laterality Date   BOTOX INJECTION N/A 01/14/2020   Procedure: INJECTION OF BOTOX INTO ANAL SPHINCTER;  Surgeon: Ileana Roup, MD;  Location: [facility];  Service: General;  Laterality: N/A;   COLONOSCOPY N/A 12/19/2012   BVA:POLIDC 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 Normal appearing colon   COLONOSCOPY WITH PROPOFOL N/A 10/23/2019   Procedure: COLONOSCOPY WITH PROPOFOL;  Surgeon: Daneil Dolin, MD; external and grade 2 internal hemorrhoids, abnormal rectal blood vessels consistent with radiation proctitis s/p APC therapy, otherwise normal exam.   EVALUATION UNDER ANESTHESIA WITH ANAL FISTULECTOMY N/A 01/14/2020   Procedure: ANORECTAL EXAM UNDER ANESTHESIA;  Surgeon: Ileana Roup, MD;  Location: [facility];  Service: General;  Laterality: N/A;   HOT HEMOSTASIS  10/23/2019   Procedure: HOT HEMOSTASIS (ARGON PLASMA COAGULATION/BICAP);  Surgeon: Daneil Dolin, MD;  [facility];  Service: Endoscopy;;  apc rectal proctitis     KIDNEY SURGERY  1982   kidney tube collapse repair   RADIOACTIVE SEED IMPLANT     Prostate    Inpatient Medications: Scheduled Meds:  Continuous Infusions:  potassium chloride 10 mEq (08/21/22 1630)   PRN Meds:   Allergies:    Allergies  Allergen Reactions   Latex Rash    Possible reaction to  latex gloves per patient   Meclizine     Other reaction(s): Abdominal Pain, Other    Social History:   Social History   Socioeconomic History   Marital status: Single    Spouse name: Not on file   Number of children: 2   Years of education: Not on file   Highest education level: 12th grade  Occupational History   Occupation: Custodian     Employer: [employer]  Tobacco Use   Smoking status: Former    Packs/day: 1.00    Years: 20.00    Total pack years: 20.00    Types: Cigarettes    Quit date: 2020    Years since quitting: 4.0   Smokeless tobacco: Never   Tobacco comments:    Quit smoking x 2 years    07/2015   SOMETIIMES i USE VAPOR   Vaping Use   Vaping Use: Never used  Substance and Sexual Activity   Alcohol use: Never   Drug use: Never   Sexual activity: Not Currently  Other Topics Concern   Not on file  Social History Narrative   Lives with friend   Divorced   No caffeine   Social Determinants of Health   Financial Resource Strain: [place]  (11/03/2020)   Overall Financial Resource Strain (CARDIA)    Difficulty of Paying Living Expenses: Not hard at all  Food Insecurity: No [place] (11/03/2020)   Hunger Vital Sign    Worried About Running Out of Food in the Last Year: Never true    [place] in the Last Year: Never true  Transportation Needs: No Transportation Needs (11/03/2020)   PRAPARE - Hydrologist (Medical): No    Lack of Transportation (Non-Medical): No  Physical Activity: Inactive (11/03/2020)   Exercise Vital Sign    Days of Exercise per Week: 0 days    Minutes of Exercise per Session: 0 min  Stress: No Stress Concern Present (11/03/2020)   [place]    Feeling of Stress : Not at all  Social Connections: Socially Isolated (11/03/2020)   Social Connection and Isolation Panel [NHANES]    Frequency of Communication with Friends and  Family: Twice a week    Frequency of Social Gatherings with Friends and Family: Once a week    Attends Religious Services: Never    Marine scientist or Organizations: No    Attends Archivist Meetings: Never    Marital Status: Divorced  Human resources officer Violence: Not At Risk (11/03/2020)   Humiliation, Afraid, Rape, and Kick questionnaire    Fear of Current or Ex-Partner: No    Emotionally Abused: No    Physically Abused: No    Sexually Abused: No    Family History:    Family History  Problem Relation Age of Onset  Aneurysm Mother        aortic   Hypertension Mother    Headache Mother    Prostate cancer Father    Hypertension Father    Colon cancer Neg Hx    Colon polyps Neg Hx      ROS:  Please see the history of present illness.  ROS  All other ROS reviewed and negative.     Physical Exam/Data:   Vitals:   08/21/22 1510 08/21/22 1528 08/21/22 1600 08/21/22 1630  BP: 139/73 135/80 133/84 (!) 144/85  Pulse: 91 76 71 73  Resp: '17 16 15 20  '$ Temp:      TempSrc:      SpO2: 98% 100% 100% 100%  Weight:      Height:       No intake or output data in the 24 hours ending 08/21/22 1659 Filed Weights   08/21/22 1453  Weight: 86.2 kg   Body mass index is 29.76 kg/m.  General:  Well nourished, well developed, in no acute distress HEENT: normal Lymph: no adenopathy Neck: no JVD Endocrine:  No thryomegaly Vascular: No carotid bruits; FA pulses 2+ bilaterally without bruits  Cardiac:  normal S1, S2; RRR; no murmur  Lungs:  clear to auscultation bilaterally, no wheezing, rhonchi or rales  Abd: soft, nontender, no hepatomegaly  Ext: no edema Musculoskeletal:  No deformities, BUE and BLE strength normal and equal Skin: warm and dry  Neuro:  CNs 2-12 intact, no focal abnormalities noted Psych:  Normal affect   EKG:  The EKG was personally reviewed and demonstrates:   Telemetry:  Telemetry was personally reviewed and demonstrates:    Relevant CV  Studies:   Laboratory Data:  Chemistry Recent Labs  Lab 08/19/22 1342 08/20/22 0049 08/21/22 1523  NA 138 137 138  K 3.4* 3.3* 3.2*  CL 102 102 103  CO2 '27 25 24  '$ GLUCOSE 120* 106* 123*  BUN <5* <5* <5*  CREATININE 0.81 0.86 0.75  CALCIUM 9.7 9.8 9.5  GFRNONAA >60 >60 >60  ANIONGAP '9 10 11    '$ Recent Labs  Lab 08/17/22 1027 08/20/22 0049  PROT 7.3 6.7  ALBUMIN 4.1 3.6  AST 34 28  ALT 51* 38  ALKPHOS 46 39  BILITOT 0.7 0.6   Hematology Recent Labs  Lab 08/19/22 1342 08/20/22 0049 08/21/22 1523  WBC 2.9* 3.8* 3.2*  RBC 4.47 4.49 4.67  HGB 13.4 13.4 13.8  HCT 39.9 40.3 41.6  MCV 89.3 89.8 89.1  MCH 30.0 29.8 29.6  MCHC 33.6 33.3 33.2  RDW 12.8 12.7 12.7  PLT 222 249 239   Cardiac EnzymesNo results for input(s): "TROPONINI" in the last 168 hours. No results for input(s): "TROPIPOC" in the last 168 hours.  BNPNo results for input(s): "BNP", "PROBNP" in the last 168 hours.  DDimer No results for input(s): "DDIMER" in the last 168 hours.  Radiology/Studies:  DG Chest Portable 1 View  Result Date: 08/21/2022 CLINICAL DATA:  Shortness of breath, found to be in SVT. EXAM: PORTABLE CHEST 1 VIEW COMPARISON:  Chest radiograph 08/20/2022 FINDINGS: The cardiomediastinal silhouette is stable. Left basilar atelectasis/scar is again seen. There is no new or worsening focal airspace disease. There is no pulmonary edema. There is no significant pleural effusion. There is no pneumothorax There is no acute osseous abnormality. IMPRESSION: Stable chest with no radiographic evidence of acute cardiopulmonary process. Electronically Signed   By: Valetta Mole M.D.   On: 08/21/2022 16:30   DG Chest 2 View  Result Date: 08/20/2022 CLINICAL DATA:  Chest pain EXAM: CHEST - 2 VIEW COMPARISON:  08/10/2022 FINDINGS: Cardiac shadow is stable. Lungs are clear bilaterally. Some chronic scarring in the left base is noted. No bony abnormality is seen. IMPRESSION: No acute abnormality noted.  Electronically Signed   By: Inez Catalina M.D.   On: 08/20/2022 01:27           Assessment and Plan:   Patient is a 72 year old M known to have paroxysmal atrial flutter on anticoagulation diagnosed in 2020, HTN, hypothyroidism, prostate cancer is postradiation therapy in 2013 presented to the ER with chief complaints of shortness of breath and found to be in SVT. Spontaneously converted to NSR with belching.  # Hyperthyroidism # History of paroxysmal atrial flutter in 2020 # SVT, likely AVNRT (on today's telemetry/EKG), strips attached above; spontaneously converted to NSR with belching -Switch carvedilol to metoprolol 50 mg twice daily.  If heart rates continue to be poorly controlled, will need to add long-acting diltiazem (after holding amlodipine). -Continue Eliquis 5 mg twice daily -Patient is an extremely high risk of atrial arrhythmias and thromboembolism due to hyperthyroidism. Recommend to treat underlying etiology, hyperthyroidism. -No role of amiodarone due to history of hyperthyroidism -Will curbside with EP regarding safety of antiarrhythmics in the management of atrial arrhythmias from hyperthyroidism.  # HTN, controlled -Resume home antihypertensive medications, amlodipine 10 mg once daily  # Chest tightness -Patient has been having chest tightness since 05/2022 and recently has been occurring almost every day.  He underwent Lexiscan in 2020, low risk study.  Once his hyperthyroidism is very well-controlled, he will benefit from a repeat stress test in outpatient setting.   For questions or updates, please contact [facility] Please consult [email or web address] for contact info under Cardiology/STEMI.   Signed, Vangie Bicker, MD 08/21/2022 4:59 PM

## 2022-08-21 NOTE — H&P (Signed)
History and Physical    Patient: Steven [name] YJE:[id number] DOB: April 22, 1951 DOA: 08/21/2022 DOS: the patient was seen and examined on 08/21/2022 PCP: Redmond School, MD  Patient coming from: Home - lives with his wife. Ambulates independently.    Chief Complaint: weakness, poor PO intake, nausea, palpitations   HPI: Steven [name] is a 72 y.o. male with medical history significant of prostate cancer,  atrial fibrillation, HTN, hyperthyroidism, OSA, anxiety who presented to ED with complaints of weakness and palpitations. He has been to Ed x 6 times in past week. He has been weak, sick and nauseated x 2 months, but seems to be getting progressively worse. Today he felt sick and weak and tried to tough it out, but couldn't so he came to the hospital.  When he came into today he was in atrial fib, but converted on his own while in ED. He missed his coreg this morning. He states he has intermittent hot flashes and sweats, stomach pain in setting of IBS. No fevers at home. He has some shortness of breath. No vomiting or leg swelling. Low grade temperature at home to 99.0.    Denies any fever/chills, vision changes/headaches, chest pain, no diarrhea, dysuria or leg swelling. No sick contacts at home.   He does not smoke or drink alcohol.   ER Course:  vitals: afebrile, bp: 143/80, HR: 78, RR: 16, oxygen: 99%RA Pertinent labs: potassium: 3.2,  CXR: no acute process In ED: cardiology consulted. Given '5mg'$  lopressor and potassium 61mq. TRH asked to admit.   Review of Systems: As mentioned in the history of present illness. All other systems reviewed and are negative. Past Medical History:  Diagnosis Date   Anxiety    Arthritis    Atrial fibrillation (HCC)    Atrial flutter (HCC)    BPH (benign prostatic hyperplasia)    Chest pain    Dizziness    Dyspnea    laying down occ   Fracture 08/17/2015   MULTIPLE RIB FRACTURES     FROM FALL    GERD (gastroesophageal reflux disease)     Hemorrhoids    History of kidney stones    noted on CT scan   History of radiation therapy 08/22/11-10/13/11   prostate   Hyperlipemia    Hypertension    Hyperthyroidism    IBS (irritable bowel syndrome)    Insomnia    Light headedness    Migraine    Numbness and tingling in left arm    Numbness and tingling of both legs    Open fracture of left elbow 08/18/2015   Prostate cancer ([facility]    prostate s/p radiation Mar 2013   Rib fractures 08/17/2015   Tinnitus    Past Surgical History:  Procedure Laterality Date   BOTOX INJECTION N/A 01/14/2020   Procedure: INJECTION OF BOTOX INTO ANAL SPHINCTER;  Surgeon: WIleana Roup MD;  Location: [facility];  Service: General;  Laterality: N/A;   COLONOSCOPY N/A 12/19/2012   ROVZ:CHYIFO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 Normal appearing colon   COLONOSCOPY WITH PROPOFOL N/A 10/23/2019   Procedure: COLONOSCOPY WITH PROPOFOL;  Surgeon: RDaneil Dolin MD; external and grade 2 internal hemorrhoids, abnormal rectal blood vessels consistent with radiation proctitis s/p APC therapy, otherwise normal exam.   EVALUATION UNDER ANESTHESIA WITH ANAL FISTULECTOMY N/A 01/14/2020   Procedure: ANORECTAL EXAM UNDER ANESTHESIA;  Surgeon: WIleana Roup MD;  Location: [facility];  Service: General;  Laterality: N/A;   HOT HEMOSTASIS  10/23/2019   Procedure: HOT HEMOSTASIS (ARGON PLASMA COAGULATION/BICAP);  Surgeon: Daneil Dolin, MD;  [facility];  Service: Endoscopy;;  apc rectal proctitis     KIDNEY SURGERY  1982   kidney tube collapse repair   RADIOACTIVE SEED IMPLANT     Prostate   Social History:  reports that he quit smoking about 4 years ago. His smoking use included cigarettes. He has a 20.00 pack-year smoking history. He has never used smokeless tobacco. He reports that he does not drink alcohol and does not use drugs.  Allergies  Allergen Reactions   Latex Rash     Possible reaction to latex gloves per patient   Meclizine     Other reaction(s): Abdominal Pain, Other    Family History  Problem Relation Age of Onset   Aneurysm Mother        aortic   Hypertension Mother    Headache Mother    Prostate cancer Father    Hypertension Father    Colon cancer Neg Hx    Colon polyps Neg Hx     Prior to Admission medications   Medication Sig Start Date End Date Taking? Authorizing Provider  alfuzosin (UROXATRAL) 10 MG 24 hr tablet Take 1 tablet (10 mg total) by mouth daily with breakfast. 04/07/22   [name], Candee Furbish, MD  amLODipine (NORVASC) 10 MG tablet Take 1 tablet by mouth daily. 12/08/20   [provider]  carvedilol (COREG) 6.25 MG tablet TAKE 1 TABLET(6.25 MG) BY MOUTH TWICE DAILY 07/21/22   Josue Hector, MD  Cholecalciferol (VITAMIN D3) 125 MCG (5000 UT) CAPS Take 1 capsule by mouth daily.     [provider]  ELIQUIS 5 MG TABS tablet TAKE 1 TABLET(5 MG) BY MOUTH TWICE DAILY Patient taking differently: Take 5 mg by mouth 2 (two) times daily. 04/24/22   Josue Hector, MD  Erenumab-aooe (AIMOVIG) 70 MG/ML SOAJ Inject 1 mL into the skin every 30 (thirty) days.    [provider]  gabapentin (NEURONTIN) 300 MG capsule Take 300 mg by mouth 3 (three) times daily. 02/24/20   [provider]  hydrochlorothiazide (HYDRODIURIL) 25 MG tablet Take 25 mg by mouth daily. 07/28/19   [provider]  hyoscyamine (LEVSIN SL) 0.125 MG SL tablet Place 1 tablet (0.125 mg total) under the tongue every 4 (four) hours as needed for cramping (abdominal pain). Patient not taking: Reported on 08/17/2022 08/13/22   Steven Greek, MD  losartan (COZAAR) 100 MG tablet Take 100 mg by mouth daily. 01/05/21   [provider]  methimazole (TAPAZOLE) 5 MG tablet Take 1 tablet (5 mg total) by mouth 2 (two) times daily. 08/19/22 09/18/22  Steven Chapel, MD  metoCLOPramide (REGLAN) 10 MG tablet Take 1 tablet (10 mg total) by  mouth every 8 (eight) hours as needed for nausea or vomiting. 08/10/22   Audley Hose, MD  NEXLETOL 180 MG TABS Take 180 mg by mouth daily. Pt taking 2-3 times a week 12/03/19   [provider]  omeprazole (PRILOSEC) 20 MG capsule Take 20 mg by mouth daily.    [provider]  ondansetron (ZOFRAN-ODT) 4 MG disintegrating tablet Take 1 tablet (4 mg total) by mouth every 8 (eight) hours as needed for nausea or vomiting. 08/01/22   Kommor, [place], MD  potassium chloride SA (KLOR-CON) 20 MEQ tablet Take 20 mEq by mouth 3 (three) times daily. 12/09/19   [provider]  Probiotic Product (PROBIOTIC-10 PO) Take 1 capsule by mouth daily.    [provider]  promethazine (PHENERGAN) 12.5 MG tablet Take 12.5 mg by mouth every 6 (six) hours as needed for nausea or vomiting.    [provider]  vitamin B-12 (CYANOCOBALAMIN) 100 MCG tablet Take 100 mcg by mouth daily.    [provider]    Physical Exam: Vitals:   08/21/22 1700 08/21/22 1800 08/21/22 1912 08/21/22 2145  BP: 133/87 130/83 129/85 (!) 140/81  Pulse: 72 67 71 67  Resp: '15 18 20   '$ Temp:  98 F (36.7 C) 98.6 F (37 C)   TempSrc:  Oral    SpO2: 100% 98% 100%   Weight:   84.6 kg   Height:   '5\' 7"'$  (1.702 m)    General:  Appears calm and comfortable and is in NAD Eyes:  PERRL, EOMI, normal lids, iris ENT:  grossly normal hearing, lips & tongue, mmm; appropriate dentition Neck:  no LAD, masses or thyromegaly; no carotid bruits Cardiovascular:  RRR, no m/r/g. No LE edema.  Respiratory:   CTA bilaterally with no wheezes/rales/rhonchi.  Normal respiratory effort. Abdomen:  soft, NT, ND, NABS Back:   normal alignment, no CVAT Skin:  no rash or induration seen on limited exam Musculoskeletal:  grossly normal tone BUE/BLE, good ROM, no bony abnormality Lower extremity:  No LE edema.  Limited foot exam with no ulcerations.  2+ distal pulses. Psychiatric:  grossly normal mood and affect,  speech fluent and appropriate, AOx3 Neurologic:  CN 2-12 grossly intact, moves all extremities in coordinated fashion, sensation intact   Radiological Exams on Admission: Independently reviewed - see discussion in A/P where applicable  DG Chest Portable 1 View  Result Date: 08/21/2022 CLINICAL DATA:  Shortness of breath, found to be in SVT. EXAM: PORTABLE CHEST 1 VIEW COMPARISON:  Chest radiograph 08/20/2022 FINDINGS: The cardiomediastinal silhouette is stable. Left basilar atelectasis/scar is again seen. There is no new or worsening focal airspace disease. There is no pulmonary edema. There is no significant pleural effusion. There is no pneumothorax There is no acute osseous abnormality. IMPRESSION: Stable chest with no radiographic evidence of acute cardiopulmonary process. Electronically Signed   By: Valetta Mole M.D.   On: 08/21/2022 16:30   DG Chest 2 View  Result Date: 08/20/2022 CLINICAL DATA:  Chest pain EXAM: CHEST - 2 VIEW COMPARISON:  08/10/2022 FINDINGS: Cardiac shadow is stable. Lungs are clear bilaterally. Some chronic scarring in the left base is noted. No bony abnormality is seen. IMPRESSION: No acute abnormality noted. Electronically Signed   By: Inez Catalina M.D.   On: 08/20/2022 01:27    EKG: Independently reviewed.  NSR with rate 89, PAC ; nonspecific ST changes with no evidence of acute ischemia  Repeat EKG atrial fib with RVR rate of 153   Labs on Admission: I have personally reviewed the available labs and imaging studies at the time of the admission.  Pertinent labs:   Potassium: 3.3   Assessment and Plan: Principal Problem:   SVT (supraventricular tachycardia) Active Problems:   Hyperthyroidism   Hypokalemia   HTN (hypertension)   Obstructive sleep apnea syndrome   Malignant neoplasm of prostate (HCC)    Assessment and Plan: * SVT (supraventricular tachycardia) 72 year old presenting to ED with complaints of weakness, nausea, poor PO intake and  palpitations found to be in SVT with rate of 153. Hx of atrial fibrillation and initial EKG thought to be atrial  fib; however, cardiology read as SVT. Converted on his own back to NSR  -obs to telemetry  -in NSR now -cardiology consulted, recommended to stop coreg and start metoprolol '50mg'$  BID  -echo -continue eliquis '5mg'$  BID -if heart rate not controlled consider propranolol since stops the conversion of T4>T3  -thyroid storm score of 25 today, making thyroid storm unlikely; however has reported temp to 99 and stomach pain at home which would raise this score. Still waiting on T4/T3. On methimazole and metoprolol. Heart rate has been controlled. See above if heart rate not controlled. Monitor vitals/symptoms closely as may need higher level of care if does go into thyroid storm  -need to call endocrinology office tomorrow for titration of medication  -f/u with cardiology recs   Hyperthyroidism Seen by his endocronologist on 1/10 -methimazole started at '5mg'$  BID to help control his thyroid in a reasonable amount of time  -Ideally, he would need thyroid uptake and scan to assess the nature of his thyroid dysfunction.  -recommend to get in touch with his endocrinologist tomorrow -see #1 for SVT/thyroid storm score  -T3/T4 pending   Hypokalemia Magnesium wnl Adrenal insufficiency ruled out with his endocrinologist Continue home potassium Replete and follow   HTN (hypertension) Med rec has not been done and unsure what he is taking Continue metoprolol '50mg'$  BID per cardiology, coreg stopped Unsure if on hctz and losartan, f;u on med rec tomorrow PRN IV if needed.   Obstructive sleep apnea syndrome Not on cpap, needs to do sleep study   Malignant neoplasm of prostate ([place]) In remission Followed by Dr. Alyson Ingles at [facility]     Advance Care Planning:   Code Status: Full Code   Consults: cardiology   DVT Prophylaxis: eliquis   Family Communication: wife at bedside    Severity of Illness: The appropriate patient status for this patient is OBSERVATION. Observation status is judged to be reasonable and necessary in order to provide the required intensity of service to ensure the patient's safety. The patient's presenting symptoms, physical exam findings, and initial radiographic and laboratory data in the context of their medical condition is felt to place them at decreased risk for further clinical deterioration. Furthermore, it is anticipated that the patient will be medically stable for discharge from the hospital within 2 midnights of admission.   Author: Orma Flaming, MD 08/21/2022 9:58 PM  For on call review [email or web address].

## 2022-08-21 NOTE — Assessment & Plan Note (Signed)
72 year old presenting to ED with complaints of weakness, nausea, poor PO intake and palpitations found to be in SVT with rate of 153. Hx of atrial fibrillation and initial EKG thought to be atrial fib; however, cardiology read as SVT. Converted on his own back to NSR  -obs to telemetry  -in NSR now -cardiology consulted, recommended to stop coreg and start metoprolol '50mg'$  BID  -echo -continue eliquis '5mg'$  BID -if heart rate not controlled consider propranolol since stops the conversion of T4>T3  -thyroid storm score of 25 today, making thyroid storm unlikely; however has reported temp to 99 and stomach pain at home which would raise this score. Still waiting on T4/T3. On methimazole and metoprolol. Heart rate has been controlled. See above if heart rate not controlled. Monitor vitals/symptoms closely as may need higher level of care if does go into thyroid storm  -need to call endocrinology office tomorrow for titration of medication  -f/u with cardiology recs

## 2022-08-21 NOTE — Assessment & Plan Note (Addendum)
Magnesium wnl Adrenal insufficiency ruled out with his endocrinologist Continue home potassium Replete and follow

## 2022-08-21 NOTE — Telephone Encounter (Signed)
Pt called stating he needed to speak to Dr. Liliane Channel nurse in relation to upping his dosage for his thyroid medication.

## 2022-08-21 NOTE — ED Provider Notes (Signed)
[facility] Provider Note   CSN: [id number] Arrival date & time: 08/21/22  1446     History  No chief complaint on file.   Steven [name] is a 72 y.o. male.  HPI Patient presents with shortness of breath.  EMS called.  While with EMS found to be in narrow complex tachycardia.  Initially called SVT by EMS believe it is more likely A-fib or flutter.  History of A-fib and flutter.  Is on anticoagulation but states he did not take it this morning.  Also did not have his Coreg this morning.  States he is more fatigued and have difficulty getting up.  Has had several visits to the ER in the last 10 days for fatigue shortness of breath.  Found to be hypothyroidism.   Past Medical History:  Diagnosis Date   Anxiety    Arthritis    Atrial fibrillation (HCC)    Atrial flutter (HCC)    BPH (benign prostatic hyperplasia)    Chest pain    Dizziness    Dyspnea    laying down occ   Fracture 08/17/2015   MULTIPLE RIB FRACTURES     FROM FALL    GERD (gastroesophageal reflux disease)    Hemorrhoids    History of kidney stones    noted on CT scan   History of radiation therapy 08/22/11-10/13/11   prostate   Hyperlipemia    Hypertension    Hyperthyroidism    IBS (irritable bowel syndrome)    Insomnia    Light headedness    Migraine    Numbness and tingling in left arm    Numbness and tingling of both legs    Open fracture of left elbow 08/18/2015   Prostate cancer [facility])    prostate s/p radiation Mar 2013   Rib fractures 08/17/2015   Tinnitus     Home Medications Prior to Admission medications   Medication Sig Start Date End Date Taking? Authorizing Provider  alfuzosin (UROXATRAL) 10 MG 24 hr tablet Take 1 tablet (10 mg total) by mouth daily with breakfast. 04/07/22   [name], Candee Furbish, MD  amLODipine (NORVASC) 10 MG tablet Take 1 tablet by mouth daily. 12/08/20   [provider]  carvedilol (COREG) 6.25 MG tablet TAKE 1  TABLET(6.25 MG) BY MOUTH TWICE DAILY 07/21/22   Josue Hector, MD  Cholecalciferol (VITAMIN D3) 125 MCG (5000 UT) CAPS Take 1 capsule by mouth daily.     [provider]  ELIQUIS 5 MG TABS tablet TAKE 1 TABLET(5 MG) BY MOUTH TWICE DAILY Patient taking differently: Take 5 mg by mouth 2 (two) times daily. 04/24/22   Josue Hector, MD  Erenumab-aooe (AIMOVIG) 70 MG/ML SOAJ Inject 1 mL into the skin every 30 (thirty) days.    [provider]  gabapentin (NEURONTIN) 300 MG capsule Take 300 mg by mouth 3 (three) times daily. 02/24/20   [provider]  hydrochlorothiazide (HYDRODIURIL) 25 MG tablet Take 25 mg by mouth daily. 07/28/19   [provider]  hyoscyamine (LEVSIN SL) 0.125 MG SL tablet Place 1 tablet (0.125 mg total) under the tongue every 4 (four) hours as needed for cramping (abdominal pain). Patient not taking: Reported on 08/17/2022 08/13/22   [name], MD  losartan (COZAAR) 100 MG tablet Take 100 mg by mouth daily. 01/05/21   [provider]  methimazole (TAPAZOLE) 5 MG tablet Take 1 tablet (5 mg total) by mouth 2 (two) times  daily. 08/19/22 09/18/22  Noemi Chapel, MD  metoCLOPramide (REGLAN) 10 MG tablet Take 1 tablet (10 mg total) by mouth every 8 (eight) hours as needed for nausea or vomiting. 08/10/22   Audley Hose, MD  NEXLETOL 180 MG TABS Take 180 mg by mouth daily. Pt taking 2-3 times a week 12/03/19   [provider]  omeprazole (PRILOSEC) 20 MG capsule Take 20 mg by mouth daily.    [provider]  ondansetron (ZOFRAN-ODT) 4 MG disintegrating tablet Take 1 tablet (4 mg total) by mouth every 8 (eight) hours as needed for nausea or vomiting. 08/01/22   Kommor, [place], MD  potassium chloride SA (KLOR-CON) 20 MEQ tablet Take 20 mEq by mouth 3 (three) times daily. 12/09/19   [provider]  Probiotic Product (PROBIOTIC-10 PO) Take 1 capsule by mouth daily.    [provider]  promethazine  (PHENERGAN) 12.5 MG tablet Take 12.5 mg by mouth every 6 (six) hours as needed for nausea or vomiting.    [provider]  vitamin B-12 (CYANOCOBALAMIN) 100 MCG tablet Take 100 mcg by mouth daily.    [provider]      Allergies    Latex and Meclizine    Review of Systems   Review of Systems  Physical Exam Updated Vital Signs BP 133/87   Pulse 72   Temp 97.8 F (36.6 C) (Oral)   Resp 15   Ht '5\' 7"'$  (1.702 m)   Wt 86.2 kg   SpO2 100%   BMI 29.76 kg/m  Physical Exam Vitals and nursing note reviewed.  Cardiovascular:     Rate and Rhythm: Regular rhythm. Tachycardia present.  Pulmonary:     Breath sounds: No wheezing.  Abdominal:     Tenderness: There is no abdominal tenderness.  Musculoskeletal:     Cervical back: Neck supple.     Right lower leg: No edema.  Skin:    General: Skin is warm.     Capillary Refill: Capillary refill takes less than 2 seconds.  Neurological:     Mental Status: He is alert and oriented to person, place, and time.     ED Results / Procedures / Treatments   Labs (all labs ordered are listed, but only abnormal results are displayed) Labs Reviewed  BASIC METABOLIC PANEL - Abnormal; Notable for the following components:      Result Value   Potassium 3.2 (*)    Glucose, Bld 123 (*)    BUN <5 (*)    All other components within normal limits  CBC - Abnormal; Notable for the following components:   WBC 3.2 (*)    All other components within normal limits  T3, FREE  T4, FREE    EKG EKG Interpretation  Date/Time:  Monday August 21 2022 14:55:23 EST Ventricular Rate:  153 PR Interval:  48 QRS Duration: 88 QT Interval:  293 QTC Calculation: 468 R Axis:   14 Text Interpretation: Atrial fibrillation ST depression, probably rate related Artifact in lead(s) III V2 V6 Confirmed by Davonna Belling [phone]) on 08/21/2022 3:05:21 PM  Radiology DG Chest Portable 1 View  Result Date: 08/21/2022 CLINICAL DATA:  Shortness of  breath, found to be in SVT. EXAM: PORTABLE CHEST 1 VIEW COMPARISON:  Chest radiograph 08/20/2022 FINDINGS: The cardiomediastinal silhouette is stable. Left basilar atelectasis/scar is again seen. There is no new or worsening focal airspace disease. There is no pulmonary edema. There is no significant pleural effusion. There is no pneumothorax There is  no acute osseous abnormality. IMPRESSION: Stable chest with no radiographic evidence of acute cardiopulmonary process. Electronically Signed   By: Valetta Mole M.D.   On: 08/21/2022 16:30   DG Chest 2 View  Result Date: 08/20/2022 CLINICAL DATA:  Chest pain EXAM: CHEST - 2 VIEW COMPARISON:  08/10/2022 FINDINGS: Cardiac shadow is stable. Lungs are clear bilaterally. Some chronic scarring in the left base is noted. No bony abnormality is seen. IMPRESSION: No acute abnormality noted. Electronically Signed   By: Inez Catalina M.D.   On: 08/20/2022 01:27    Procedures Procedures    Medications Ordered in ED Medications  potassium chloride 10 mEq in 100 mL IVPB (10 mEq Intravenous New Bag/Given 08/21/22 1630)  metoprolol tartrate (LOPRESSOR) injection 5 mg (5 mg Intravenous Given 08/21/22 1526)    ED Course/ Medical Decision Making/ A&P                             Medical Decision Making Amount and/or Complexity of Data Reviewed Labs: ordered. Radiology: ordered.  Risk Prescription drug management. Decision regarding hospitalization.   Patient is shortness of breath.  Found to be in a narrow complex tachycardia.  I think likely atrial flutter.  However is hypotensive.  Had not had his anticoagulation today or his Coreg.  Discussed with Dr.Mallipeddi from cardiology.  Decision made for cardioversion due to hypotension.  However patient spontaneously converted with a cough.  Will give some metoprolol.  Will require admission to the hospital.  TSH is very low.  Will get chest x-ray.  Lab work reviewed from yesterday.  CRITICAL CARE Performed by:  Davonna Belling Total critical care time: 30 minutes Critical care time was exclusive of separately billable procedures and treating other patients. Critical care was necessary to treat or prevent imminent or life-threatening deterioration. Critical care was time spent personally by me on the following activities: development of treatment plan with patient and/or surrogate as well as nursing, discussions with consultants, evaluation of patient's response to treatment, examination of patient, obtaining history from patient or surrogate, ordering and performing treatments and interventions, ordering and review of laboratory studies, ordering and review of radiographic studies, pulse oximetry and re-evaluation of patient's condition.  Reviewed cardiology note.  Think it may be an SVT/AV node reentry tachycardia.  Treat underlying pathology and change Coreg to metoprolol.  Also discussed with Dr. Eliberto Ivory from hospitalist who will see patient         Final Clinical Impression(s) / ED Diagnoses Final diagnoses:  Hyperthyroidism  SVT (supraventricular tachycardia)    Rx / DC Orders ED Discharge Orders     None         Davonna Belling, MD 08/21/22 1721

## 2022-08-21 NOTE — Assessment & Plan Note (Signed)
In remission Followed by Dr. Alyson Ingles at [facility]

## 2022-08-21 NOTE — Telephone Encounter (Signed)
Thanks, I received it, we are also able to see all [facility] results in Epic.

## 2022-08-21 NOTE — Assessment & Plan Note (Addendum)
Seen by his endocronologist on 1/10 -methimazole started at '5mg'$  BID to help control his thyroid in a reasonable amount of time  -Ideally, he would need thyroid uptake and scan to assess the nature of his thyroid dysfunction.  -recommend to get in touch with his endocrinologist tomorrow -see #1 for SVT/thyroid storm score  -T3/T4 pending

## 2022-08-21 NOTE — Assessment & Plan Note (Signed)
Med rec has not been done and unsure what he is taking Continue metoprolol '50mg'$  BID per cardiology, coreg stopped Unsure if on hctz and losartan, f;u on med rec tomorrow PRN IV if needed.

## 2022-08-21 NOTE — Telephone Encounter (Signed)
Steven [name], he has appt tomorrow with dr Gala Romney. I have the stool test from [facility]. Its for a fecal pancreatic elastase. How can I send this to you so you will have it tomorrow.?

## 2022-08-21 NOTE — Telephone Encounter (Signed)
Patient called and reported he is gassy, has a hard time passing stool, stool is not hard but comes out in small amounts. He has been to ED multiple times recently and reports pcp has done a stool test on him that he has not got results on. I advised him to call pcp for resutls. Reports its been about one week. ( I have tried multiple times to call dr fusco and number is busy to see if we can get results faxed to [place]. I will keep trying to get through) he has a follow up on 2/27. He said he talked with someone who is trying to get his appt moved up for him. He reports trulance and linzess over work him he takes the meds even if he takes a half of tablet. He takes miralax one capful daily and reports more that than that overworks him. He is having left side pain same as when he was seen on 07/13/22. He takes 2 chewable fiber gummies per day. He wonders if his symptoms are related to his thryoid or to him having gallstones. He said his follow up with endocrinologist is in April. I advised him to follow ed recommendations and call pcp and endocrinologist to get a sooner follow up. He said he would.   [phone]

## 2022-08-21 NOTE — Telephone Encounter (Signed)
Left a message requesting pt return call to the office. ?

## 2022-08-21 NOTE — ED Triage Notes (Signed)
Pt presents to ED via [facility], EMS called out for SOB, pt found to be in SVT. HX afib. Given NS 200 ml PTA

## 2022-08-21 NOTE — Assessment & Plan Note (Signed)
Not on cpap, needs to do sleep study

## 2022-08-21 NOTE — Telephone Encounter (Signed)
[name], I will fax it to you. Let me know if you dont receive it.

## 2022-08-22 ENCOUNTER — Telehealth: Payer: Self-pay | Admitting: Gastroenterology

## 2022-08-22 ENCOUNTER — Ambulatory Visit (HOSPITAL_COMMUNITY): Payer: 59

## 2022-08-22 ENCOUNTER — Observation Stay (HOSPITAL_BASED_OUTPATIENT_CLINIC_OR_DEPARTMENT_OTHER): Payer: 59

## 2022-08-22 ENCOUNTER — Ambulatory Visit: Payer: 59 | Admitting: Internal Medicine

## 2022-08-22 ENCOUNTER — Telehealth: Payer: Self-pay | Admitting: Cardiovascular Disease

## 2022-08-22 ENCOUNTER — Other Ambulatory Visit (HOSPITAL_COMMUNITY): Payer: Self-pay | Admitting: *Deleted

## 2022-08-22 DIAGNOSIS — E0590 Thyrotoxicosis, unspecified without thyrotoxic crisis or storm: Secondary | ICD-10-CM

## 2022-08-22 DIAGNOSIS — R1310 Dysphagia, unspecified: Secondary | ICD-10-CM | POA: Diagnosis not present

## 2022-08-22 DIAGNOSIS — I471 Supraventricular tachycardia, unspecified: Secondary | ICD-10-CM

## 2022-08-22 DIAGNOSIS — R110 Nausea: Secondary | ICD-10-CM | POA: Diagnosis not present

## 2022-08-22 DIAGNOSIS — I10 Essential (primary) hypertension: Secondary | ICD-10-CM | POA: Diagnosis not present

## 2022-08-22 DIAGNOSIS — R0789 Other chest pain: Secondary | ICD-10-CM

## 2022-08-22 DIAGNOSIS — I4892 Unspecified atrial flutter: Secondary | ICD-10-CM

## 2022-08-22 DIAGNOSIS — R634 Abnormal weight loss: Secondary | ICD-10-CM

## 2022-08-22 DIAGNOSIS — E876 Hypokalemia: Secondary | ICD-10-CM | POA: Diagnosis not present

## 2022-08-22 DIAGNOSIS — K581 Irritable bowel syndrome with constipation: Secondary | ICD-10-CM | POA: Diagnosis not present

## 2022-08-22 LAB — ECHOCARDIOGRAM COMPLETE
Area-P 1/2: 4.06 cm2
Height: 67 in
S' Lateral: 2.9 cm
Weight: 2985.6 oz

## 2022-08-22 LAB — CBC
HCT: 37.6 % — ABNORMAL LOW (ref 39.0–52.0)
Hemoglobin: 12.3 g/dL — ABNORMAL LOW (ref 13.0–17.0)
MCH: 29.6 pg (ref 26.0–34.0)
MCHC: 32.7 g/dL (ref 30.0–36.0)
MCV: 90.4 fL (ref 80.0–100.0)
Platelets: 224 10*3/uL (ref 150–400)
RBC: 4.16 MIL/uL — ABNORMAL LOW (ref 4.22–5.81)
RDW: 12.7 % (ref 11.5–15.5)
WBC: 3.7 10*3/uL — ABNORMAL LOW (ref 4.0–10.5)
nRBC: 0 % (ref 0.0–0.2)

## 2022-08-22 LAB — TSH: TSH: 0.018 u[IU]/mL — ABNORMAL LOW (ref 0.350–4.500)

## 2022-08-22 LAB — BASIC METABOLIC PANEL
Anion gap: 6 (ref 5–15)
BUN: <5 mg/dL — ABNORMAL LOW (ref 8–23)
CO2: 25 mmol/L (ref 22–32)
Calcium: 9.1 mg/dL (ref 8.9–10.3)
Chloride: 108 mmol/L (ref 98–111)
Creatinine, Ser: 0.76 mg/dL (ref 0.61–1.24)
GFR, Estimated: >60 mL/min (ref >60)
Glucose, Bld: 99 mg/dL (ref 70–99)
Potassium: 3.3 mmol/L — ABNORMAL LOW (ref 3.5–5.1)
Sodium: 139 mmol/L (ref 135–145)

## 2022-08-22 MED ORDER — METHIMAZOLE 5 MG PO TABS
5.0000 mg | ORAL_TABLET | Freq: Once | ORAL | Status: AC
  Administered 2022-08-22: 5 mg via ORAL
  Filled 2022-08-22: qty 1

## 2022-08-22 MED ORDER — PANTOPRAZOLE SODIUM 40 MG PO TBEC
40.0000 mg | DELAYED_RELEASE_TABLET | Freq: Every day | ORAL | Status: DC
  Administered 2022-08-22 – 2022-08-23 (×2): 40 mg via ORAL
  Filled 2022-08-22 (×2): qty 1

## 2022-08-22 MED ORDER — METHIMAZOLE 5 MG PO TABS
10.0000 mg | ORAL_TABLET | Freq: Two times a day (BID) | ORAL | Status: DC
  Administered 2022-08-22 – 2022-08-23 (×2): 10 mg via ORAL
  Filled 2022-08-22 (×2): qty 2

## 2022-08-22 MED ORDER — POLYETHYLENE GLYCOL 3350 17 G PO PACK
17.0000 g | PACK | Freq: Two times a day (BID) | ORAL | Status: DC
  Administered 2022-08-22 – 2022-08-23 (×2): 17 g via ORAL
  Filled 2022-08-22 (×2): qty 1

## 2022-08-22 NOTE — Progress Notes (Addendum)
PROGRESS NOTE  Steven [name] SLH:[id number] DOB: Apr 13, 1951 DOA: 08/21/2022 PCP: Redmond School, MD  Brief History:  72 year old male with a history of atrial fibrillation, prostate cancer status post XRT, hypothyroidism, hypertension, OSA, anxiety presenting with generalized weakness, dizziness, palpitations/chest discomfort, and intermittent shortness of breath.  The patient has had numerous ED visits for similar symptoms in the past month.  He states that he has lost 30 pounds since Thanksgiving 2023.  He denies any frank fevers, chills, vomiting, hematemesis, headache, neck pain, abdominal pain.  He has had intermittent loose stools without any hematochezia or melena. The patient went to see endocrinology, Dr. Dorris Fetch on 08/02/2022.  He was started on methimazole 5 mg daily for his hyperthyroidism.  He endorses compliance with it. In the ED, the patient was afebrile and hemodynamically stable with oxygen saturation 100% room air.  He was initially noted to have atrial fibrillation with RVR with heart rate in the 150s.  Fortunately, he spontaneously converted back to sinus rhythm prior to needing cardioversion.  Cardiology was consulted and changed his carvedilol to metoprolol.  The patient was admitted for further evaluation and treatment of his SVT and hyperthyroidism.   Assessment/Plan: SVT -Patient had prior history of atrial fibrillation -Continue apixaban -Spontaneously converted to sinus rhythm -Continue metoprolol tartrate -Echo  Hyperthyroidism -Patient follows Dr. Dorris Fetch -I spoke with Dr. Dorris Fetch 1/30>>increase methimazole to 10 mg bid -follow up in clinic one week after d/c -defer further auto-antibody work up, RAIU to Dr. Dorris Fetch -TSH 0.018 -Free T4--2.16 -Clinically does not have thyroid storm.  Hypokalemia -Replete -Magnesium 1.9  Essential hypertension -Continue metoprolol -Holding amlodipine to allow blood pressure margin for titration -Holding HCTZ and  losartan  Prostate cancer -Outpatient follow-up with Dr. Alyson Ingles -In remission  IBS-diarrhea -discussed with GI, Dr. Annye Asa previously had IBS-C -they will make recommendations          Family Communication:   spouse at bedside 1/30  Consultants:  cardiology  Code Status:  FULL   DVT Prophylaxis: apixaban   Procedures: As Listed in Progress Note Above  Antibiotics: None        Subjective: Patient denies fevers, chills, headache, chest pain, dyspnea, nausea, abdominal pain, dysuria, hematuria, hematochezia, and melena.   Objective: Vitals:   08/21/22 1912 08/21/22 2145 08/22/22 0104 08/22/22 0415  BP: 129/85 (!) 140/81 134/75 128/77  Pulse: 71 67 63 (!) 58  Resp: '20  18 18  '$ Temp: 98.6 F (37 C)  97.8 F (36.6 C) 98 F (36.7 C)  TempSrc:      SpO2: 100%  97% 100%  Weight: 84.6 kg     Height: '5\' 7"'$  (1.702 m)       Intake/Output Summary (Last 24 hours) at 08/22/2022 0957 Last data filed at 08/22/2022 0441 Gross per 24 hour  Intake 1136.63 ml  Output --  Net 1136.63 ml   Weight change:  Exam:  General:  Pt is alert, follows commands appropriately, not in acute distress HEENT: No icterus, No thrush, No neck mass, [place]/AT Cardiovascular: RRR, S1/S2, no rubs, no gallops Respiratory: CTA bilaterally, no wheezing, no crackles, no rhonchi Abdomen: Soft/+BS, non tender, non distended, no guarding Extremities: No edema, No lymphangitis, No petechiae, No rashes, no synovitis   Data Reviewed: I have personally reviewed following labs and imaging studies Basic Metabolic Panel: Recent Labs  Lab 08/17/22 1027 08/19/22 1342 08/20/22 0049 08/21/22 1523 08/22/22 0435  NA 137 138 137 138 139  K 3.3* 3.4* 3.3* 3.2* 3.3*  CL 100 102 102 103 108  CO2 '27 27 25 24 25  '$ GLUCOSE 113* 120* 106* 123* 99  BUN <5* <5* <5* <5* <5*  CREATININE 0.84 0.81 0.86 0.75 0.76  CALCIUM 9.9 9.7 9.8 9.5 9.1  MG  --   --  1.9  --   --    Liver Function  Tests: Recent Labs  Lab 08/17/22 1027 08/20/22 0049  AST 34 28  ALT 51* 38  ALKPHOS 46 39  BILITOT 0.7 0.6  PROT 7.3 6.7  ALBUMIN 4.1 3.6   Recent Labs  Lab 08/20/22 0049  LIPASE 29   Recent Labs  Lab 08/17/22 1027  AMMONIA 30   Coagulation Profile: Recent Labs  Lab 08/19/22 1342  INR 1.2   CBC: Recent Labs  Lab 08/17/22 1027 08/19/22 1342 08/20/22 0049 08/21/22 1523 08/22/22 0435  WBC 2.5* 2.9* 3.8* 3.2* 3.7*  NEUTROABS 1.1* 1.5* 1.8  --   --   HGB 14.0 13.4 13.4 13.8 12.3*  HCT 41.4 39.9 40.3 41.6 37.6*  MCV 89.0 89.3 89.8 89.1 90.4  PLT 239 222 249 239 224   Cardiac Enzymes: No results for input(s): "CKTOTAL", "CKMB", "CKMBINDEX", "TROPONINI" in the last 168 hours. BNP: Invalid input(s): "POCBNP" CBG: Recent Labs  Lab 08/17/22 1016  GLUCAP 119*   HbA1C: No results for input(s): "HGBA1C" in the last 72 hours. Urine analysis:    Component Value Date/Time   COLORURINE YELLOW 08/20/2022 1035   APPEARANCEUR CLEAR 08/20/2022 1035   APPEARANCEUR Clear 06/14/2021 1416   LABSPEC 1.004 (L) 08/20/2022 1035   PHURINE 6.0 08/20/2022 1035   GLUCOSEU NEGATIVE 08/20/2022 1035   HGBUR NEGATIVE 08/20/2022 [place] 08/20/2022 1035   BILIRUBINUR Negative 06/14/2021 1416   KETONESUR NEGATIVE 08/20/2022 1035   PROTEINUR NEGATIVE 08/20/2022 1035   UROBILINOGEN negative (A) 12/12/2019 1113   UROBILINOGEN 1.0 08/03/2012 1156   NITRITE NEGATIVE 08/20/2022 1035   LEUKOCYTESUR NEGATIVE 08/20/2022 1035   Sepsis Labs: '@LABRCNTIP'$ (procalcitonin:4,lacticidven:4) )No results found for this or any previous visit (from the past 240 hour(s)).   Scheduled Meds:  apixaban  5 mg Oral BID   methimazole  10 mg Oral BID   methimazole  5 mg Oral Once   metoprolol tartrate  50 mg Oral BID   pneumococcal 20-valent conjugate vaccine  0.5 mL Intramuscular Tomorrow-1000   potassium chloride SA  20 mEq Oral TID   Continuous  Infusions:  Procedures/Studies: DG Chest Portable 1 View  Result Date: 08/21/2022 CLINICAL DATA:  Shortness of breath, found to be in SVT. EXAM: PORTABLE CHEST 1 VIEW COMPARISON:  Chest radiograph 08/20/2022 FINDINGS: The cardiomediastinal silhouette is stable. Left basilar atelectasis/scar is again seen. There is no new or worsening focal airspace disease. There is no pulmonary edema. There is no significant pleural effusion. There is no pneumothorax There is no acute osseous abnormality. IMPRESSION: Stable chest with no radiographic evidence of acute cardiopulmonary process. Electronically Signed   By: Valetta Mole M.D.   On: 08/21/2022 16:30   DG Chest 2 View  Result Date: 08/20/2022 CLINICAL DATA:  Chest pain EXAM: CHEST - 2 VIEW COMPARISON:  08/10/2022 FINDINGS: Cardiac shadow is stable. Lungs are clear bilaterally. Some chronic scarring in the left base is noted. No bony abnormality is seen. IMPRESSION: No acute abnormality noted. Electronically Signed   By: Inez Catalina M.D.   On: 08/20/2022 01:27   DG ABD ACUTE 2+V W 1V CHEST  Result Date:  08/13/2022 CLINICAL DATA:  Abdominal pain with constipated feeling EXAM: DG ABDOMEN ACUTE WITH 1 VIEW CHEST COMPARISON:  08/10/2022 FINDINGS: No abnormal stool retention. A few proximal colonic fluid levels are noted, which may be in normal limits. No gas dilated small bowel for obstruction. No concerning mass effect or calcification. Prostate fiducial markers. Scoliosis. Scar-like density over the lower lobes also seen on prior abdominal CT. Normal heart size and mediastinal contours. No edema or consolidation. IMPRESSION: No abnormal stool retention.  No dilated bowel to imply obstruction. Electronically Signed   By: Jorje Guild M.D.   On: 08/13/2022 04:31   DG Chest 2 View  Result Date: 08/10/2022 CLINICAL DATA:  Dizziness. EXAM: CHEST - 2 VIEW COMPARISON:  08/01/2022 FINDINGS: AP and lateral views of the chest show low lung volumes. The cardio  pericardial silhouette is enlarged. There is pulmonary vascular congestion without overt pulmonary edema. Possible left base atelectasis or infiltrate with potential tiny left pleural effusion. The visualized bony structures of the thorax are unremarkable. Telemetry leads overlie the chest. IMPRESSION: Low volume film with vascular congestion and possible left base atelectasis or infiltrate with potential tiny left pleural effusion. Electronically Signed   By: Misty Stanley M.D.   On: 08/10/2022 11:46   DG Chest 2 View  Result Date: 08/01/2022 CLINICAL DATA:  Dyspnea, chest tightness, shortness of breath and palpitations. EXAM: CHEST - 2 VIEW COMPARISON:  PA Lat 06/28/2022 FINDINGS: The cardiomediastinal silhouette and vascular pattern are normal. Eventrations are again noted along both hemidiaphragms and scattered linear atelectasis in the bases. The lungs are clear of infiltrates. The sulci are sharp. There is osteopenia, mild thoracic kyphosis, and multilevel thoracic bridging enthesopathy. IMPRESSION: 1. No evidence of acute chest disease.  Stable chest. 2. Osteopenia and multilevel thoracic bridging enthesopathy. Electronically Signed   By: Telford Nab M.D.   On: 08/01/2022 07:43    Orson Eva, DO  [facility]  If 7PM-7AM, please contact night-coverage [email or web address] Password [email or web address] 08/22/2022, 9:57 AM   LOS: 0 days

## 2022-08-22 NOTE — Progress Notes (Signed)
*  PRELIMINARY RESULTS* Echocardiogram 2D Echocardiogram has been performed.  Steven [name] 08/22/2022, 11:30 AM

## 2022-08-22 NOTE — Consult Note (Addendum)
$'@LOGO'C$ @   Referring Provider: Dr. Carles Collet Primary Care Physician:  Redmond School, MD Primary Gastroenterologist:  Dr. Gala Romney  Date of Admission: 08/21/22 Date of Consultation: 08/22/22  Reason for Consultation: IBS, diarrhea, patient missed office visit and is requesting to be seen.  HPI:  Steven [name] is a 71 y.o. year old male  with a history of atrial fibrillation, prostate cancer s/p XRT, subclinical hypothyroidism, prior weight loss in the setting of  subacute thyroiditis treated with a course of methimazole previously, HTN, anxiety, IBS-C, chronic rectal pain with history of chronic anal fissure undergoing Botox therapy with [place] surgery and also seen at [place] who recommended supportive treatment with focus on management of constipation/IBS, who presented to the emergency room with generalized weakness, dizziness, palpitations/chest discomfort, shortness of breath.   In the ED: Initially noted to have atrial fibrillation with RVR but spontaneously converted back to sinus rhythm.   Labs remarkable for potassium 3.2 and free T4 elevated at 2.16, otherwise labs were stable.  He was ultimately admitted for further evaluation and treatment of SVT and hyperthyroidism.  GI now consulted at the request of patient for IBS to missing his scheduled office visit today.  Today he states:  Having a lot of nausea for the last 2  months. Worsening. Occurs most days of the week. Comes on after eating. Can't identify any specific food triggers. Avoiding fried foods and spicy foods. Sometimes nausea secondary to migraines.  Doesn't have an appetite. Reports 30 lb weight loss since December.   Takes Zofran at home for nausea. Reglan and phenergan make him feel bad.   Taking omeprazole 20 mg every morning. No regular reflux symptoms. Reports solid foods, capsules, and occasionally liquids don't go down his esophageus well. No regurgitation. This has been going on for many  months without worsening.   Having hot flashes.   Bowels have also changed. This started in November. Before he has a BM, he gets gassy. Also with some mild lower abdominal pain prior to a BM. Has been taking fiber gummies. Takes MiraLAX once a day. Passes several soft pieces of stool that breaks up when he flushes the toilet. States they are small in amount. May have to go back several times to have a BM. Has to strain to have a BM or have gas. No brbpr or melena. MOM will cause diarrhea.  Linzess and Trulance have caused diarrhea.  He also had diarrhea about 3 to 4 weeks ago when he was taking antibiotics (penicillin, then clindamycin) for a tooth infection, but diarrhea resolved.  Reports Dr. Gerarda Fraction did some urine testing and stool testing.  States urine testing was normal.  Not sure about the stool testing.  Per chart review, looks like Dr. Gerarda Fraction may have ordered 5 HIAA testing   Has a lot of neuropathic pain. Following with [facility].   No NSAIDs No alcohol.  No tobacco use.    CT A/P with contrast 07/15/2022 with cholelithiasis without cholecystitis, stable scattered hypodensities in the liver likely cyst or hemangiomas, nonobstructive right renal calculus.  Past Medical History:  Diagnosis Date   Anxiety    Arthritis    Atrial fibrillation (HCC)    Atrial flutter (HCC)    BPH (benign prostatic hyperplasia)    Chest pain    Dizziness    Dyspnea    laying down occ   Fracture 08/17/2015   MULTIPLE RIB FRACTURES     FROM FALL    GERD (  gastroesophageal reflux disease)    Hemorrhoids    History of kidney stones    noted on CT scan   History of radiation therapy 08/22/11-10/13/11   prostate   Hyperlipemia    Hypertension    Hyperthyroidism    IBS (irritable bowel syndrome)    Insomnia    Light headedness    Migraine    Numbness and tingling in left arm    Numbness and tingling of both legs    Open fracture of left elbow 08/18/2015   Prostate cancer [facility])     prostate s/p radiation Mar 2013   Rib fractures 08/17/2015   Tinnitus     Past Surgical History:  Procedure Laterality Date   BOTOX INJECTION N/A 01/14/2020   Procedure: INJECTION OF BOTOX INTO ANAL SPHINCTER;  Surgeon: Ileana Roup, MD;  Location: [facility];  Service: General;  Laterality: N/A;   COLONOSCOPY N/A 12/19/2012   FYB:OFBPZW 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 Normal appearing colon   COLONOSCOPY WITH PROPOFOL N/A 10/23/2019   Procedure: COLONOSCOPY WITH PROPOFOL;  Surgeon: Daneil Dolin, MD; external and grade 2 internal hemorrhoids, abnormal rectal blood vessels consistent with radiation proctitis s/p APC therapy, otherwise normal exam.   EVALUATION UNDER ANESTHESIA WITH ANAL FISTULECTOMY N/A 01/14/2020   Procedure: ANORECTAL EXAM UNDER ANESTHESIA;  Surgeon: Ileana Roup, MD;  Location: [facility];  Service: General;  Laterality: N/A;   HOT HEMOSTASIS  10/23/2019   Procedure: HOT HEMOSTASIS (ARGON PLASMA COAGULATION/BICAP);  Surgeon: Daneil Dolin, MD;  Location: [facility];  Service: Endoscopy;;  apc rectal proctitis     KIDNEY SURGERY  1982   kidney tube collapse repair   RADIOACTIVE SEED IMPLANT     Prostate    Prior to Admission medications   Medication Sig Start Date End Date Taking? Authorizing Provider  alfuzosin (UROXATRAL) 10 MG 24 hr tablet Take 1 tablet (10 mg total) by mouth daily with breakfast. 04/07/22  Yes [name], Candee Furbish, MD  amLODipine (NORVASC) 10 MG tablet Take 1 tablet by mouth daily. 12/08/20  Yes [provider]  Cholecalciferol (VITAMIN D3) 125 MCG (5000 UT) CAPS Take 1 capsule by mouth daily.    Yes [provider]  ELIQUIS 5 MG TABS tablet TAKE 1 TABLET(5 MG) BY MOUTH TWICE DAILY Patient taking differently: Take 5 mg by mouth 2 (two) times daily. 04/24/22  Yes Josue Hector, MD  Erenumab-aooe (AIMOVIG) 70 MG/ML SOAJ Inject 1 mL into the skin every 30  (thirty) days.   Yes [provider]  gabapentin (NEURONTIN) 300 MG capsule Take 300 mg by mouth 3 (three) times daily as needed. 02/24/20  Yes [provider]  hydrochlorothiazide (HYDRODIURIL) 25 MG tablet Take 25 mg by mouth daily. 07/28/19  Yes [provider]  losartan (COZAAR) 100 MG tablet Take 100 mg by mouth daily. 01/05/21  Yes [provider]  methimazole (TAPAZOLE) 5 MG tablet Take 1 tablet (5 mg total) by mouth 2 (two) times daily. 08/19/22 09/18/22 Yes Noemi Chapel, MD  NEXLETOL 180 MG TABS Take 180 mg by mouth daily. Pt taking 2-3 times a week 12/03/19  Yes [provider]  omeprazole (PRILOSEC) 20 MG capsule Take 20 mg by mouth daily.   Yes [provider]  ondansetron (ZOFRAN-ODT) 4 MG disintegrating tablet Take 1 tablet (4 mg total) by mouth every 8 (eight) hours as needed for nausea or vomiting. 08/01/22  Yes Kommor, [place],  MD  potassium chloride SA (KLOR-CON) 20 MEQ tablet Take 20 mEq by mouth 3 (three) times daily. 12/09/19  Yes [provider]  Probiotic Product (PROBIOTIC-10 PO) Take 1 capsule by mouth daily.   Yes [provider]  vitamin B-12 (CYANOCOBALAMIN) 100 MCG tablet Take 100 mcg by mouth daily.   Yes [provider]  hyoscyamine (LEVSIN SL) 0.125 MG SL tablet Place 1 tablet (0.125 mg total) under the tongue every 4 (four) hours as needed for cramping (abdominal pain). Patient not taking: Reported on 08/17/2022 08/13/22   [name], MD  metoCLOPramide (REGLAN) 10 MG tablet Take 1 tablet (10 mg total) by mouth every 8 (eight) hours as needed for nausea or vomiting. Patient not taking: Reported on 08/22/2022 08/10/22   Audley Hose, MD    Current Facility-Administered Medications  Medication Dose Route Frequency Provider Last Rate Last Admin   acetaminophen (TYLENOL) tablet 650 mg  650 mg Oral Q6H PRN Orma Flaming, MD       Or   acetaminophen (TYLENOL) suppository 650 mg  650 mg  Rectal Q6H PRN Orma Flaming, MD       apixaban Ar[facility]) tablet 5 mg  5 mg Oral BID Orma Flaming, MD   5 mg at 08/22/22 0915   methimazole (TAPAZOLE) tablet 10 mg  10 mg Oral BID Tat, Shanon Brow, MD       metoprolol tartrate (LOPRESSOR) injection 5 mg  5 mg Intravenous Q8H PRN Orma Flaming, MD       metoprolol tartrate (LOPRESSOR) tablet 50 mg  50 mg Oral BID Orma Flaming, MD   50 mg at 08/22/22 0915   ondansetron [facility]) tablet 4 mg  4 mg Oral Q6H PRN Orma Flaming, MD   4 mg at 08/22/22 1132   Or   ondansetron (ZOFRAN) injection 4 mg  4 mg Intravenous Q6H PRN Orma Flaming, MD       pneumococcal 20-valent conjugate vaccine (PREVNAR 20) injection 0.5 mL  0.5 mL Intramuscular Tomorrow-1000 Orma Flaming, MD       polyethylene glycol (MIRALAX / GLYCOLAX) packet 17 g  17 g Oral Daily PRN Orma Flaming, MD   17 g at 08/21/22 2140   potassium chloride SA (KLOR-CON M) CR tablet 20 mEq  20 mEq Oral TID Orma Flaming, MD   20 mEq at 08/22/22 0915   traZODone (DESYREL) tablet 50 mg  50 mg Oral QHS PRN Orma Flaming, MD   50 mg at 08/21/22 2139    Allergies as of 08/21/2022 - Review Complete 08/21/2022  Allergen Reaction Noted   Latex Rash 03/25/2020   Meclizine  09/29/2021    Family History  Problem Relation Age of Onset   Aneurysm Mother        aortic   Hypertension Mother    Headache Mother    Prostate cancer Father    Hypertension Father    Colon cancer Neg Hx    Colon polyps Neg Hx     Social History   Socioeconomic History   Marital status: Single    Spouse name: Not on file   Number of children: 2   Years of education: Not on file   Highest education level: 12th grade  Occupational History   Occupation: Custodian     Employer: [employer]  Tobacco Use   Smoking status: Former    Packs/day: 1.00    Years: 20.00    Total pack years: 20.00    Types: Cigarettes  Quit date: 2020    Years since quitting: 4.0   Smokeless tobacco: Never   Tobacco  comments:    Quit smoking x 2 years    07/2015   SOMETIIMES i USE VAPOR   Vaping Use   Vaping Use: Never used  Substance and Sexual Activity   Alcohol use: Never   Drug use: Never   Sexual activity: Not Currently  Other Topics Concern   Not on file  Social History Narrative   Lives with friend   Divorced   No caffeine   Social Determinants of Health   Financial Resource Strain: Low Risk  (11/03/2020)   Overall Financial Resource Strain (CARDIA)    Difficulty of Paying Living Expenses: Not hard at all  Food Insecurity: No Food Insecurity (11/03/2020)   Hunger Vital Sign    Worried About Running Out of Food in the Last Year: Never true    Ran Out of Food in the Last Year: Never true  Transportation Needs: No Transportation Needs (11/03/2020)   PRAPARE - Hydrologist (Medical): No    Lack of Transportation (Non-Medical): No  Physical Activity: Inactive (11/03/2020)   Exercise Vital Sign    Days of Exercise per Week: 0 days    Minutes of Exercise per Session: 0 min  Stress: No Stress Concern Present (11/03/2020)   [place]    Feeling of Stress : Not at all  Social Connections: Socially Isolated (11/03/2020)   Social Connection and Isolation Panel [NHANES]    Frequency of Communication with Friends and Family: Twice a week    Frequency of Social Gatherings with Friends and Family: Once a week    Attends Religious Services: Never    Marine scientist or Organizations: No    Attends Archivist Meetings: Never    Marital Status: Divorced  Human resources officer Violence: Not At Risk (11/03/2020)   Humiliation, Afraid, Rape, and Kick questionnaire    Fear of Current or Ex-Partner: No    Emotionally Abused: No    Physically Abused: No    Sexually Abused: No    Review of Systems: Gen: Denies fever, chills, cold or flulike symptoms.  Admits to occasional weakness, dizziness, hot  flashes, unintentional weight loss. CV: Admits to occasional chest pain and heart palpitations. Resp: Admits to shortness of breath if having heart palpitations. GI: See HPI GU : Reports urinary hesitancy. MS: Denies joint pain. Derm: Denies rash. Psych: Admits to anxiety. Heme: See HPI  Physical Exam: Vital signs in last 24 hours: Temp:  [97.8 F (36.6 C)-98.7 F (37.1 C)] 98.7 F (37.1 C) (01/30 1213) Pulse Rate:  [58-159] 61 (01/30 1213) Resp:  [15-29] 18 (01/30 1213) BP: (86-144)/(71-87) 132/72 (01/30 1213) SpO2:  [97 %-100 %] 100 % (01/30 1213) Weight:  [84.6 kg-86.2 kg] 84.6 kg (01/29 1912) Last BM Date : 08/21/22 General:   Alert,  Well-developed, well-nourished, pleasant and cooperative in NAD Head:  Normocephalic and atraumatic. Eyes:  Sclera clear, no icterus.   Conjunctiva pink. Ears:  Normal auditory acuity. Lungs:  Clear throughout to auscultation.   No wheezes, crackles, or rhonchi. No acute distress. Heart:  Regular rate and rhythm; no murmurs, clicks, rubs,  or gallops. Abdomen:  Soft, nontender and nondistended. No masses, hepatosplenomegaly or hernias noted. Normal bowel sounds, without guarding, and without rebound.   Rectal:  Deferred   Msk:  Symmetrical without gross deformities. Normal posture. Extremities:  Without edema. Neurologic:  Alert and  oriented x4;  grossly normal neurologically. Skin:  Intact without significant lesions or rashes. Psych:  Normal mood and affect.  Intake/Output from previous day: 01/29 0701 - 01/30 0700 In: 1136.6 [P.O.:480; I.V.:656.6] Out: -  Intake/Output this shift: Total I/O In: 240 [P.O.:240] Out: -   Lab Results: Recent Labs    08/20/22 0049 08/21/22 1523 08/22/22 0435  WBC 3.8* 3.2* 3.7*  HGB 13.4 13.8 12.3*  HCT 40.3 41.6 37.6*  PLT 249 239 224   BMET Recent Labs    08/20/22 0049 08/21/22 1523 08/22/22 0435  NA 137 138 139  K 3.3* 3.2* 3.3*  CL 102 103 108  CO2 '25 24 25  '$ GLUCOSE 106* 123* 99   BUN <5* <5* <5*  CREATININE 0.86 0.75 0.76  CALCIUM 9.8 9.5 9.1   LFT Recent Labs    08/20/22 0049  PROT 6.7  ALBUMIN 3.6  AST 28  ALT 38  ALKPHOS 39  BILITOT 0.6    Studies/Results: ECHOCARDIOGRAM COMPLETE  Result Date: 08/22/2022    ECHOCARDIOGRAM REPORT   Patient Name:   Steven [name] Date of Exam: 08/22/2022 Medical Rec #:  [id number]      Height:       67.0 in Accession #:    [id number]     Weight:       186.6 lb Date of Birth:  [birth year]      BSA:          1.964 m Patient Age:    89 years       BP:           128/77 mmHg Patient Gender: M              HR:           62 bpm. Exam Location:  [facility] Procedure: 2D Echo, Cardiac Doppler and Color Doppler Indications:    Atrial Flutter I48.92  History:        Patient has prior history of Echocardiogram examinations, most                 recent 03/02/2019. Risk Factors:Hypertension and Dyslipidemia.  Sonographer:    Alvino Chapel RCS Referring Phys: [id number] Joseph  1. Left ventricular ejection fraction, by estimation, is 60 to 65%. The left ventricle has normal function. The left ventricle has no regional wall motion abnormalities. Left ventricular diastolic parameters are consistent with Grade I diastolic dysfunction (impaired relaxation).  2. Right ventricular systolic function is normal. The right ventricular size is normal. Tricuspid regurgitation signal is inadequate for assessing PA pressure.  3. Left atrial size was mildly dilated.  4. The mitral valve is normal in structure. No evidence of mitral valve regurgitation. No evidence of mitral stenosis.  5. The aortic valve is tricuspid. There is moderate calcification of the aortic valve. Aortic valve regurgitation is not visualized. No aortic stenosis is present.  6. The inferior vena cava is normal in size with greater than 50% respiratory variability, suggesting right atrial pressure of 3 mmHg. Comparison(s): No significant change from prior study. FINDINGS  Left  Ventricle: Left ventricular ejection fraction, by estimation, is 60 to 65%. The left ventricle has normal function. The left ventricle has no regional wall motion abnormalities. The left ventricular internal cavity size was normal in size. There is  no left ventricular hypertrophy. Left ventricular diastolic parameters are consistent with Grade I diastolic dysfunction (impaired relaxation). Right Ventricle: The right ventricular  size is normal. No increase in right ventricular wall thickness. Right ventricular systolic function is normal. Tricuspid regurgitation signal is inadequate for assessing PA pressure. Left Atrium: Left atrial size was mildly dilated. Right Atrium: Right atrial size was normal in size. Pericardium: There is no evidence of pericardial effusion. Mitral Valve: The mitral valve is normal in structure. No evidence of mitral valve regurgitation. No evidence of mitral valve stenosis. Tricuspid Valve: The tricuspid valve is grossly normal. Tricuspid valve regurgitation is not demonstrated. No evidence of tricuspid stenosis. Aortic Valve: The aortic valve is tricuspid. There is moderate calcification of the aortic valve. Aortic valve regurgitation is not visualized. No aortic stenosis is present. Pulmonic Valve: The pulmonic valve was not well visualized. Pulmonic valve regurgitation is mild. No evidence of pulmonic stenosis. Aorta: The aortic root is normal in size and structure. Venous: The inferior vena cava is normal in size with greater than 50% respiratory variability, suggesting right atrial pressure of 3 mmHg. IAS/Shunts: No atrial level shunt detected by color flow Doppler.  LEFT VENTRICLE PLAX 2D LVIDd:         4.80 cm   Diastology LVIDs:         2.90 cm   LV e' medial:    7.83 cm/s LV PW:         0.90 cm   LV E/e' medial:  10.4 LV IVS:        0.90 cm   LV e' lateral:   8.59 cm/s LVOT diam:     1.80 cm   LV E/e' lateral: 9.5 LV SV:         79 LV SV Index:   40 LVOT Area:     2.54 cm  RIGHT  VENTRICLE RV S prime:     11.50 cm/s TAPSE (M-mode): 2.1 cm LEFT ATRIUM           Index        RIGHT ATRIUM           Index LA diam:      3.50 cm 1.78 cm/m   RA Area:     15.70 cm LA Vol (A4C): 72.9 ml 37.13 ml/m  RA Volume:   46.10 ml  23.48 ml/m  AORTIC VALVE LVOT Vmax:   135.00 cm/s LVOT Vmean:  88.900 cm/s LVOT VTI:    0.312 m  AORTA Ao Root diam: 3.30 cm MITRAL VALVE MV Area (PHT): 4.06 cm     SHUNTS MV Decel Time: 187 msec     Systemic VTI:  0.31 m MV E velocity: 81.80 cm/s   Systemic Diam: 1.80 cm MV A velocity: 101.00 cm/s MV E/A ratio:  0.81 Vishnu Priya Mallipeddi Electronically signed by Lorelee Cover Mallipeddi Signature Date/Time: 08/22/2022/1:18:59 PM    Final    DG Chest Portable 1 View  Result Date: 08/21/2022 CLINICAL DATA:  Shortness of breath, found to be in SVT. EXAM: PORTABLE CHEST 1 VIEW COMPARISON:  Chest radiograph 08/20/2022 FINDINGS: The cardiomediastinal silhouette is stable. Left basilar atelectasis/scar is again seen. There is no new or worsening focal airspace disease. There is no pulmonary edema. There is no significant pleural effusion. There is no pneumothorax There is no acute osseous abnormality. IMPRESSION: Stable chest with no radiographic evidence of acute cardiopulmonary process. Electronically Signed   By: Valetta Mole M.D.   On: 08/21/2022 16:30    Impression:  72 y.o. year old male  with a history of atrial fibrillation, prostate cancer s/p XRT, subclinical hypothyroidism, prior weight  loss in the setting of  subacute thyroiditis treated with a course of methimazole previously, HTN, anxiety, IBS-C, chronic rectal pain with history of chronic anal fissure undergoing Botox therapy with [place] surgery and also seen at [place] who recommended supportive treatment with focus on management of constipation/IBS, who presented to the emergency room with generalized weakness, dizziness, palpitations/chest discomfort, shortness of breath, found to  have atrial fibrillation with RVR and hyperthyroidism and was admitted for further management.  GI was consulted at the request of patient for nausea and IBS as he missed his scheduled office visit today.   Nausea: Chronic history of mild intermittent nausea, previously associated with migraines and reflux, but symptoms worsening over the last couple of months with nausea occurring most days of the week, typically postprandially without specific triggers. Reflux is well-controlled with omeprazole.  He does have evidence of cholelithiasis on recent imaging, but no cholecystitis and denies any real abdominal pain with his nausea.  No other findings on recent CT in December to explain his nausea. He has associated early satiety, poor appetite, and about 30 lb weight loss over the last couple of months. Denies NSAIDs or alcohol use. It is very possible that his worsening nausea is secondary to hyperthyroidism; however, his symptoms warrant an EGD for further evaluation with additional differentials include atypical GERD, gastritis, duodenitis, gastroparesis, gastric outlet obstruction, malignancy. EGD will need to be completed in the outpatient setting once his thyroid function is under better control. Will try adjusting PPI for now to see if this provides any benefit in the mean time.  Otherwise, he should continue to use Zofran as needed.  Dysphagia:  Several month history of intermittent solid food, pill, and occasional liquid dysphagia.  No worsening of times recently.  No regurgitation.  No prior EGD.  Prior barium pill esophagram in December 2022 was normal.  At this point, patient needs outpatient EGD for further evaluation once thyroid function has stabilized.  IBS: Chronic history of IBS-C.  Currently, bowel movements are essentially at baseline.  He reports having incomplete soft bowel movements that require straining.  Increased flatulence and also require straining.  Occasional abdominal pain prior  to a bowel movement that improves or after.  No BRBPR or melena.  Currently taking MiraLAX once daily and fiber Gummies.  Previously Trulance and Linzess have caused diarrhea.  At this point, I recommend increasing MiraLAX to allow more productive bowel movements.  Query whether he may have a component of dyssynergy defecation contributing to his symptoms.  He would benefit from rectal manometry in the outpatient setting once improved from acute illness.  Unintentional weight loss: History of the same in the setting of subclinical thyroiditis back in 2022 s/p course of methimazole with resolution of weight loss.  Now with recurrent symptoms again likely secondary to hyperthyroidism. He is following with Dr. Dorris Fetch and has been started on methimazole outpatient, but thyroid function hasn't improved yet. Dr. Dorris Fetch has also ordered 5 HIAA and fasting cortisol level which hasn't been completed. He has had increased nausea over the last several months which also could be secondary to hyperthyroidism, but as discussed above, he does warrant EGD for further evaluation.  Recent CT A/P with contrast in December 2023 with no evidence of malignancy.  Colonoscopy up-to-date in 2021.   Plan: Start Protonix 40 mg daily. Zofran as needed for nausea Outpatient EGD with possible dilation once thyroid function has stabilized. Increase MiraLAX to 17 g twice daily. If he  develops persistent diarrhea, can decrease back to once daily.  Continue fiber supplementation. Consider anorectal manometry outpatient. Management of hypothyroidism and atrial fibrillation per hospitalist. We will arrange for outpatient follow-up in about 4-6 weeks.    LOS: 0 days    08/22/2022, 1:53 PM   [name], [facility]astroenter[facility]

## 2022-08-22 NOTE — TOC Progression Note (Signed)
  Transition of Care [facility]) Screening Note   Patient Details  Name: Steven [name] Date of Birth: Aug 18, 1950   Transition of Care [facility]) CM/SW Contact:    Shade Flood, LCSW Phone Number: 08/22/2022, 10:04 AM    Transition of Care Department [facility]) has reviewed patient and no TOC needs have been identified at this time. We will continue to monitor patient advancement through interdisciplinary progression rounds. If new patient transition needs arise, please place a TOC consult.

## 2022-08-22 NOTE — Hospital Course (Addendum)
72 year old male with a history of atrial fibrillation, prostate cancer status post XRT, hypothyroidism, hypertension, OSA, anxiety presenting with generalized weakness, dizziness, palpitations/chest discomfort, and intermittent shortness of breath.  The patient has had numerous ED visits for similar symptoms in the past month.  He states that he has lost 30 pounds since Thanksgiving 2023.  He denies any frank fevers, chills, vomiting, hematemesis, headache, neck pain, abdominal pain.  He has had intermittent loose stools without any hematochezia or melena. The patient went to see endocrinology, Dr. Dorris Fetch on 08/02/2022.  He was started on methimazole 5 mg daily for his hyperthyroidism.  He endorses compliance with it. In the ED, the patient was afebrile and hemodynamically stable with oxygen saturation 100% room air.  He was initially noted to have atrial fibrillation with RVR with heart rate in the 150s.  Fortunately, he spontaneously converted back to sinus rhythm prior to needing cardioversion.  Cardiology was consulted and changed his carvedilol to metoprolol.  The patient was admitted for further evaluation and treatment of his SVT and hyperthyroidism.

## 2022-08-22 NOTE — Care Management Obs Status (Signed)
[facility] NOTIFICATION   Patient Details  Name: Steven [name] MRN: [id number] Date of Birth: 1951/06/01   Medicare Observation Status Notification Given:  Yes    Tommy Medal 08/22/2022, 4:36 PM

## 2022-08-22 NOTE — Telephone Encounter (Signed)
Patient is currently admitted to the hospital and missed his office visit today with Dr. Gala Romney.  Leafy Ro, can you please reschedule patient's office visit with Dr. Gala Romney in about 4-6 weeks?

## 2022-08-22 NOTE — Progress Notes (Addendum)
Rounding Note    Patient Name: Steven [name] Date of Encounter: 08/22/2022  [place] Cardiologist: Jenkins Rouge, MD   Subjective   No acute events overnight.  Denies any symptoms of chest pain, SOB, dizziness.  He does have vertigo.  Telemetry reviewed, he stays in normal sinus rhythm and heart rates are very well-controlled.   Inpatient Medications    Scheduled Meds:  apixaban  5 mg Oral BID   methimazole  5 mg Oral BID   metoprolol tartrate  50 mg Oral BID   pneumococcal 20-valent conjugate vaccine  0.5 mL Intramuscular Tomorrow-1000   potassium chloride SA  20 mEq Oral TID   Continuous Infusions:  PRN Meds: acetaminophen **OR** acetaminophen, metoprolol tartrate, ondansetron **OR** ondansetron (ZOFRAN) IV, polyethylene glycol, traZODone   Vital Signs    Vitals:   08/21/22 1912 08/21/22 2145 08/22/22 0104 08/22/22 0415  BP: 129/85 (!) 140/81 134/75 128/77  Pulse: 71 67 63 (!) 58  Resp: '20  18 18  '$ Temp: 98.6 F (37 C)  97.8 F (36.6 C) 98 F (36.7 C)  TempSrc:      SpO2: 100%  97% 100%  Weight: 84.6 kg     Height: '5\' 7"'$  (1.702 m)       Intake/Output Summary (Last 24 hours) at 08/22/2022 0919 Last data filed at 08/22/2022 0441 Gross per 24 hour  Intake 1136.63 ml  Output --  Net 1136.63 ml      08/21/2022    7:12 PM 08/21/2022    2:53 PM 08/19/2022    1:27 PM  Last 3 Weights  Weight (lbs) 186 lb 9.6 oz 190 lb 195 lb  Weight (kg) 84.641 kg 86.183 kg 88.451 kg      Telemetry    SB at 55 - Personally Reviewed  ECG    No new  - Personally Reviewed  Physical Exam   GEN: No acute distress.   Neck: No JVD Cardiac: RRR, no murmurs, rubs, or gallops.  Respiratory: Clear to auscultation bilaterally.tough upper airway wheeze GI: Soft, nontender, non-distended  MS: No edema; No deformity. Neuro:  Nonfocal  Psych: Normal affect   Labs    High Sensitivity Troponin:   Recent Labs  Lab 08/10/22 1229 08/13/22 0347 08/13/22 0603  08/20/22 0049 08/20/22 0247  TROPONINIHS 20* '8 7 14 17     '$ Chemistry Recent Labs  Lab 08/17/22 1027 08/19/22 1342 08/20/22 0049 08/21/22 1523 08/22/22 0435  NA 137   < > 137 138 139  K 3.3*   < > 3.3* 3.2* 3.3*  CL 100   < > 102 103 108  CO2 27   < > '25 24 25  '$ GLUCOSE 113*   < > 106* 123* 99  BUN <5*   < > <5* <5* <5*  CREATININE 0.84   < > 0.86 0.75 0.76  CALCIUM 9.9   < > 9.8 9.5 9.1  MG  --   --  1.9  --   --   PROT 7.3  --  6.7  --   --   ALBUMIN 4.1  --  3.6  --   --   AST 34  --  28  --   --   ALT 51*  --  38  --   --   ALKPHOS 46  --  39  --   --   BILITOT 0.7  --  0.6  --   --   GFRNONAA >60   < > >  60 >60 >60  ANIONGAP 10   < > '10 11 6   '$ < > = values in this interval not displayed.    Lipids No results for input(s): "CHOL", "TRIG", "HDL", "LABVLDL", "LDLCALC", "CHOLHDL" in the last 168 hours.  Hematology Recent Labs  Lab 08/20/22 0049 08/21/22 1523 08/22/22 0435  WBC 3.8* 3.2* 3.7*  RBC 4.49 4.67 4.16*  HGB 13.4 13.8 12.3*  HCT 40.3 41.6 37.6*  MCV 89.8 89.1 90.4  MCH 29.8 29.6 29.6  MCHC 33.3 33.2 32.7  RDW 12.7 12.7 12.7  PLT 249 239 224   Thyroid  Recent Labs  Lab 08/21/22 1523 08/22/22 0435  TSH  --  0.018*  FREET4 2.16*  --     BNPNo results for input(s): "BNP", "PROBNP" in the last 168 hours.  DDimer No results for input(s): "DDIMER" in the last 168 hours.   Radiology    DG Chest Portable 1 View  Result Date: 08/21/2022 CLINICAL DATA:  Shortness of breath, found to be in SVT. EXAM: PORTABLE CHEST 1 VIEW COMPARISON:  Chest radiograph 08/20/2022 FINDINGS: The cardiomediastinal silhouette is stable. Left basilar atelectasis/scar is again seen. There is no new or worsening focal airspace disease. There is no pulmonary edema. There is no significant pleural effusion. There is no pneumothorax There is no acute osseous abnormality. IMPRESSION: Stable chest with no radiographic evidence of acute cardiopulmonary process. Electronically Signed   By:  Valetta Mole M.D.   On: 08/21/2022 16:30    Cardiac Studies   Echo pending   TTE 03/02/19   IMPRESSIONS     1. The left ventricle has normal systolic function with an ejection  fraction of 60-65%. The cavity size was normal. Left ventricular diastolic  parameters were normal.   2. The right ventricle has normal systolic function. The cavity was  normal. There is no increase in right ventricular wall thickness.   3. Mild thickening of the mitral valve leaflet.   4. The aortic valve is tricuspid. Mild thickening of the aortic valve.  Mild calcification of the aortic valve.   5. The aorta is normal in size and structure.   FINDINGS   Left Ventricle: The left ventricle has normal systolic function, with an  ejection fraction of 60-65%. The cavity size was normal. There is no  increase in left ventricular wall thickness. Left ventricular diastolic  parameters were normal. Definity  contrast agent was given IV to delineate the left ventricular endocardial  borders.   Right Ventricle: The right ventricle has normal systolic function. The  cavity was normal. There is no increase in right ventricular wall  thickness.   Left Atrium: Left atrial size was normal in size.   Right Atrium: Right atrial size was normal in size.   Interatrial Septum: No atrial level shunt detected by color flow Doppler.   Pericardium: There is no evidence of pericardial effusion.   Mitral Valve: The mitral valve is normal in structure. Mild thickening of  the mitral valve leaflet. Mitral valve regurgitation is not visualized by  color flow Doppler.   Tricuspid Valve: The tricuspid valve is normal in structure. Tricuspid  valve regurgitation is trivial by color flow Doppler.   Aortic Valve: The aortic valve is tricuspid Mild thickening of the aortic  valve. Mild calcification of the aortic valve. Aortic valve regurgitation  was not visualized by color flow Doppler. There is no evidence of aortic  valve  stenosis.   Pulmonic Valve: The pulmonic valve was grossly normal.  Pulmonic valve  regurgitation is trivial by color flow Doppler.   Patient Profile     72 y.o. male with hx of paroxysmal atrial flutter on anticoagulation diagnosed in 2020, HTN, hypothyroidism, prostate cancer is postradiation therapy in 2013 presented to the ER with chief complaints of shortness of breath and found to be in SVT. Spontaneously converted to NSR with belching.   Assessment & Plan    # Hyperthyroidism # History of paroxysmal atrial flutter in 2020 # SVT, likely AVNRT on 08/21/2022 spontaneously converted to NSR with belching -Continue metoprolol tartrate 50 mg twice daily -Continue Eliquis 5 mg twice daily -Patient is at an extremely elevated risk of atrial arrhythmias and thromboembolism due to hyperthyroidism. Recommend to treat underlying etiology, hyperthyroidism. Consider endocrine consult while inpatient. -No role of amiodarone due to history of hyperthyroidism -Patient was not able to keep anything down since November 2023. Would recommend to start on scheduled doses of potassium supplements. Keep K>4 and <5; Mg>2 and <3.   # HTN controlled -Continue to hold amlodipine  # chest tightness  -Almost daily chest tightness since 05/2022.  Negative Lexiscan in 2020. Once hypothyroidism is very well-controlled, will reevaluate his symptoms and he might need repeat stress test.  # Hyperthyroidism -on methimazole -saw endocrinologist 08/02/22 Dr. Dorris Fetch felt the symptoms were more than mild hyperthyroidism would cause. -TSH 0.271 prior to methimazole  I have spent a total of 33 minutes with patient reviewing chart , telemetry, EKGs, labs and examining patient as well as establishing an assessment and plan that was discussed with the patient.  > 50% of time was spent in direct patient care.    For questions or updates, please contact [facility] Please consult [email or web address] for contact info under    Sheena Donegan Fidel Levy, MD [place]  11:52 AM

## 2022-08-22 NOTE — Telephone Encounter (Signed)
Patient is in the hospital and wanting to know if he can switch his appt on Friday to telephone visit for 2/2. Please advise

## 2022-08-23 DIAGNOSIS — I10 Essential (primary) hypertension: Secondary | ICD-10-CM | POA: Diagnosis not present

## 2022-08-23 DIAGNOSIS — C61 Malignant neoplasm of prostate: Secondary | ICD-10-CM

## 2022-08-23 DIAGNOSIS — I471 Supraventricular tachycardia, unspecified: Secondary | ICD-10-CM | POA: Diagnosis not present

## 2022-08-23 DIAGNOSIS — I4892 Unspecified atrial flutter: Secondary | ICD-10-CM | POA: Diagnosis not present

## 2022-08-23 DIAGNOSIS — E0590 Thyrotoxicosis, unspecified without thyrotoxic crisis or storm: Secondary | ICD-10-CM | POA: Diagnosis not present

## 2022-08-23 DIAGNOSIS — E876 Hypokalemia: Secondary | ICD-10-CM | POA: Diagnosis not present

## 2022-08-23 DIAGNOSIS — I4891 Unspecified atrial fibrillation: Secondary | ICD-10-CM | POA: Diagnosis not present

## 2022-08-23 LAB — CBC
HCT: 37.2 % — ABNORMAL LOW (ref 39.0–52.0)
Hemoglobin: 12.1 g/dL — ABNORMAL LOW (ref 13.0–17.0)
MCH: 29.7 pg (ref 26.0–34.0)
MCHC: 32.5 g/dL (ref 30.0–36.0)
MCV: 91.4 fL (ref 80.0–100.0)
Platelets: 205 10*3/uL (ref 150–400)
RBC: 4.07 MIL/uL — ABNORMAL LOW (ref 4.22–5.81)
RDW: 12.4 % (ref 11.5–15.5)
WBC: 3.6 10*3/uL — ABNORMAL LOW (ref 4.0–10.5)
nRBC: 0 % (ref 0.0–0.2)

## 2022-08-23 LAB — BASIC METABOLIC PANEL
Anion gap: 5 (ref 5–15)
BUN: <5 mg/dL — ABNORMAL LOW (ref 8–23)
CO2: 27 mmol/L (ref 22–32)
Calcium: 9.4 mg/dL (ref 8.9–10.3)
Chloride: 108 mmol/L (ref 98–111)
Creatinine, Ser: 0.86 mg/dL (ref 0.61–1.24)
GFR, Estimated: >60 mL/min (ref >60)
Glucose, Bld: 91 mg/dL (ref 70–99)
Potassium: 3.5 mmol/L (ref 3.5–5.1)
Sodium: 140 mmol/L (ref 135–145)

## 2022-08-23 LAB — MAGNESIUM: Magnesium: 2 mg/dL (ref 1.7–2.4)

## 2022-08-23 LAB — T3, FREE: T3, Free: 4.5 pg/mL — ABNORMAL HIGH (ref 2.0–4.4)

## 2022-08-23 MED ORDER — PANTOPRAZOLE SODIUM 40 MG PO TBEC: 40.0000 mg | DELAYED_RELEASE_TABLET | Freq: Every day | ORAL | 1 refills | Status: DC

## 2022-08-23 MED ORDER — POTASSIUM CHLORIDE CRYS ER 20 MEQ PO TBCR: 20.0000 meq | EXTENDED_RELEASE_TABLET | Freq: Two times a day (BID) | ORAL | Status: DC

## 2022-08-23 MED ORDER — METHIMAZOLE 10 MG PO TABS: 10.0000 mg | ORAL_TABLET | Freq: Two times a day (BID) | ORAL | 2 refills | Status: DC

## 2022-08-23 MED ORDER — LOSARTAN POTASSIUM 50 MG PO TABS: 75.0000 mg | ORAL_TABLET | Freq: Every day | ORAL | 2 refills | Status: DC

## 2022-08-23 MED ORDER — ONDANSETRON 4 MG PO TBDP: 4.0000 mg | ORAL_TABLET | Freq: Three times a day (TID) | ORAL | 1 refills | Status: DC | PRN

## 2022-08-23 MED ORDER — POLYETHYLENE GLYCOL 3350 17 G PO PACK: 17.0000 g | PACK | Freq: Every day | ORAL | 2 refills | Status: AC

## 2022-08-23 MED ORDER — METOPROLOL TARTRATE 50 MG PO TABS: 50.0000 mg | ORAL_TABLET | Freq: Two times a day (BID) | ORAL | 2 refills | Status: DC

## 2022-08-23 NOTE — Progress Notes (Signed)
Pt reported biggest concern this shift is his dizziness and tinnitus.  States that he was told he might get an MRI, "I just had one in December.  I'm claustrophobic."  Advised pt that if his sx are ongoing/worsening, he may require additional follow up testing.  Pt also expresses concern over his medications and what he hasn't been receiving.  Reviewed provider note, advised pt that some medications may have contributed to dizziness and may need reintroduced at a later time.  Pt/wife verbalize understanding.  Pt reports no SHOB, no headache.  SB on monitor.  Has home cardiac monitoring device in place.  Wife assisting pt to bathroom as needed, pt advised to call for assistance when needed after wife leaves for work.   Denies nausea, reports intermittent stomach cramping.

## 2022-08-23 NOTE — Discharge Summary (Addendum)
Physician Discharge Summary   Patient: Steven [name] MRN: [id number] DOB: 10/21/1950  Admit date:     08/21/2022  Discharge date: 08/23/22  Discharge Physician: Barton Dubois   PCP: Redmond School, MD   Recommendations at discharge:  Repeat basic metabolic panel to follow electrolytes and renal function Make sure patient has follow-up with endocrinologist as instructed Reassess blood pressure and adjust antihypertensive regimen as needed.  Discharge Diagnoses: Principal Problem:   SVT (supraventricular tachycardia) Active Problems:   Hyperthyroidism   Hypokalemia   HTN (hypertension)   Obstructive sleep apnea syndrome   Malignant neoplasm of prostate (HCC)   Paroxysmal atrial flutter (HCC)   Unintentional weight loss   Chest tightness  Hospital Course: 72 year old male with a history of atrial fibrillation, prostate cancer status post XRT, hypothyroidism, hypertension, OSA, anxiety presenting with generalized weakness, dizziness, palpitations/chest discomfort, and intermittent shortness of breath.  The patient has had numerous ED visits for similar symptoms in the past month.  He states that he has lost 30 pounds since Thanksgiving 2023.  He denies any frank fevers, chills, vomiting, hematemesis, headache, neck pain, abdominal pain.  He has had intermittent loose stools without any hematochezia or melena. The patient went to see endocrinology, Dr. Dorris Fetch on 08/02/2022.  He was started on methimazole 5 mg daily for his hyperthyroidism.  He endorses compliance with it. In the ED, the patient was afebrile and hemodynamically stable with oxygen saturation 100% room air.  He was initially noted to have atrial fibrillation with RVR with heart rate in the 150s.  Fortunately, he spontaneously converted back to sinus rhythm prior to needing cardioversion.  Cardiology was consulted and changed his carvedilol to metoprolol.  The patient was admitted for further evaluation and treatment of his SVT  and hyperthyroidism.  Assessment and Plan: SVT/paroxysmal atrial flutter/paroxysmal A. fib -Patient had prior history of atrial fibrillation -Continue apixaban for secondary prevention -Spontaneously converted to sinus rhythm -Continue metoprolol tartrate   Hyperthyroidism -Patient follows Dr. Dorris Fetch -Following recommendations by Dr. Dorris Fetch (endocrinologist) methimazole has been increased to 10 mg twice a day.   -follow up in clinic one week after d/c -defer further auto-antibody work up, RAIU to Dr. Dorris Fetch -TSH 0.018 -Free T4--2.16 -Clinically does not have thyroid storm.   Hypokalemia -Repleted and within normal limits at discharge -Repeat basic metabolic panel at follow-up visit to follow electrolytes stability. -Patient will continue daily supplementation at adjusted dose.   Essential hypertension -Continue metoprolol, losartan and HCTZ -Patient advised to follow heart healthy/low-sodium diet.   Prostate cancer/BPH -Outpatient follow-up with Dr. Alyson Ingles -In remission -continue alfuzosin.   IBS-diarrhea/constipation -Continue outpatient follow-up with gastroenterology service -Patient with IBS diarrhea and constipation.   -Continue bowel regimen and fiber intake. -maintain adequate hydration.    Consultants: Cardiology service. Procedures performed: See below for x-ray reports. Disposition: Home. Diet recommendation: Heart healthy/low-sodium diet.  DISCHARGE MEDICATION: Allergies as of 08/23/2022       Reactions   Latex Rash   Possible reaction to latex gloves per patient   Meclizine    Other reaction(s): Abdominal Pain, Other        Medication List     STOP taking these medications    amLODipine 10 MG tablet Commonly known as: NORVASC   hyoscyamine 0.125 MG SL tablet Commonly known as: LEVSIN SL   metoCLOPramide 10 MG tablet Commonly known as: REGLAN   omeprazole 20 MG capsule Commonly known as: PRILOSEC       TAKE these medications  Aimovig 70 MG/ML Soaj Generic drug: Erenumab-aooe Inject 1 mL into the skin every 30 (thirty) days.   alfuzosin 10 MG 24 hr tablet Commonly known as: UROXATRAL Take 1 tablet (10 mg total) by mouth daily with breakfast.   Eliquis 5 MG Tabs tablet Generic drug: apixaban TAKE 1 TABLET(5 MG) BY MOUTH TWICE DAILY What changed: See the new instructions.   gabapentin 300 MG capsule Commonly known as: NEURONTIN Take 300 mg by mouth 3 (three) times daily as needed.   hydrochlorothiazide 25 MG tablet Commonly known as: HYDRODIURIL Take 25 mg by mouth daily.   losartan 50 MG tablet Commonly known as: COZAAR Take 1.5 tablets (75 mg total) by mouth daily. What changed:  medication strength how much to take   methimazole 10 MG tablet Commonly known as: TAPAZOLE Take 1 tablet (10 mg total) by mouth 2 (two) times daily. What changed:  medication strength how much to take   metoprolol tartrate 50 MG tablet Commonly known as: LOPRESSOR Take 1 tablet (50 mg total) by mouth 2 (two) times daily.   Nexletol 180 MG Tabs Generic drug: Bempedoic Acid Take 180 mg by mouth daily. Pt taking 2-3 times a week   ondansetron 4 MG disintegrating tablet Commonly known as: ZOFRAN-ODT Take 1 tablet (4 mg total) by mouth every 8 (eight) hours as needed for nausea or vomiting.   pantoprazole 40 MG tablet Commonly known as: PROTONIX Take 1 tablet (40 mg total) by mouth daily.   polyethylene glycol 17 g packet Commonly known as: MIRALAX / GLYCOLAX Take 17 g by mouth daily.   potassium chloride SA 20 MEQ tablet Commonly known as: KLOR-CON M Take 1 tablet (20 mEq total) by mouth 2 (two) times daily. What changed: when to take this   PROBIOTIC-10 PO Take 1 capsule by mouth daily.   vitamin B-12 100 MCG tablet Commonly known as: CYANOCOBALAMIN Take 100 mcg by mouth daily.   Vitamin D3 125 MCG (5000 UT) Caps Take 1 capsule by mouth daily.        Follow-up Information     Redmond School, MD. Schedule an appointment as soon as possible for a visit in 10 day(s).   Specialty: Internal Medicine Contact information: [address] [place] [address] [phone]         Cassandria Anger, MD. Schedule an appointment as soon as possible for a visit in 1 week(s).   Specialty: Endocrinology Contact information: [place] [address] [phone]                Discharge Exam: OEUMP Weights   08/21/22 1453 08/21/22 1912  Weight: 86.2 kg 84.6 kg   General exam: Alert, awake, oriented x 3; no chest pain, no nausea, no vomiting. Respiratory system: Clear to auscultation. Respiratory effort normal.  Good saturation on room air. Cardiovascular system:RRR. No rubs or gallops; no JVD. Gastrointestinal system: Abdomen is nondistended, soft and nontender. No organomegaly or masses felt. Normal bowel sounds heard. Central nervous system: Alert and oriented. No focal neurological deficits. Extremities: No cyanosis or clubbing. Skin: No petechiae. Psychiatry: Judgement and insight appear normal. Mood & affect appropriate.    Condition at discharge: Stable and improved.  The results of significant diagnostics from this hospitalization (including imaging, microbiology, ancillary and laboratory) are listed below for reference.   Imaging Studies: ECHOCARDIOGRAM COMPLETE  Result Date: 08/22/2022    ECHOCARDIOGRAM REPORT   Patient Name:   Steven [name] Date of Exam: 08/22/2022 Medical  Rec #:  [id number]      Height:       67.0 in Accession #:    [id number]     Weight:       186.6 lb Date of Birth:  1950/09/22      BSA:          1.964 m Patient Age:    11 years       BP:           128/77 mmHg Patient Gender: M              HR:           62 bpm. Exam Location:  [facility] Procedure: 2D Echo, Cardiac Doppler and Color Doppler Indications:    Atrial Flutter I48.92  History:        Patient has prior history of Echocardiogram examinations, most                  recent 03/02/2019. Risk Factors:Hypertension and Dyslipidemia.  Sonographer:    Alvino Chapel RCS Referring Phys: [id number] [place]  1. Left ventricular ejection fraction, by estimation, is 60 to 65%. The left ventricle has normal function. The left ventricle has no regional wall motion abnormalities. Left ventricular diastolic parameters are consistent with Grade I diastolic dysfunction (impaired relaxation).  2. Right ventricular systolic function is normal. The right ventricular size is normal. Tricuspid regurgitation signal is inadequate for assessing PA pressure.  3. Left atrial size was mildly dilated.  4. The mitral valve is normal in structure. No evidence of mitral valve regurgitation. No evidence of mitral stenosis.  5. The aortic valve is tricuspid. There is moderate calcification of the aortic valve. Aortic valve regurgitation is not visualized. No aortic stenosis is present.  6. The inferior vena cava is normal in size with greater than 50% respiratory variability, suggesting right atrial pressure of 3 mmHg. Comparison(s): No significant change from prior study. FINDINGS  Left Ventricle: Left ventricular ejection fraction, by estimation, is 60 to 65%. The left ventricle has normal function. The left ventricle has no regional wall motion abnormalities. The left ventricular internal cavity size was normal in size. There is  no left ventricular hypertrophy. Left ventricular diastolic parameters are consistent with Grade I diastolic dysfunction (impaired relaxation). Right Ventricle: The right ventricular size is normal. No increase in right ventricular wall thickness. Right ventricular systolic function is normal. Tricuspid regurgitation signal is inadequate for assessing PA pressure. Left Atrium: Left atrial size was mildly dilated. Right Atrium: Right atrial size was normal in size. Pericardium: There is no evidence of pericardial effusion. Mitral Valve: The mitral valve is  normal in structure. No evidence of mitral valve regurgitation. No evidence of mitral valve stenosis. Tricuspid Valve: The tricuspid valve is grossly normal. Tricuspid valve regurgitation is not demonstrated. No evidence of tricuspid stenosis. Aortic Valve: The aortic valve is tricuspid. There is moderate calcification of the aortic valve. Aortic valve regurgitation is not visualized. No aortic stenosis is present. Pulmonic Valve: The pulmonic valve was not well visualized. Pulmonic valve regurgitation is mild. No evidence of pulmonic stenosis. Aorta: The aortic root is normal in size and structure. Venous: The inferior vena cava is normal in size with greater than 50% respiratory variability, suggesting right atrial pressure of 3 mmHg. IAS/Shunts: No atrial level shunt detected by color flow Doppler.  LEFT VENTRICLE PLAX 2D LVIDd:         4.80 cm   Diastology LVIDs:  2.90 cm   LV e' medial:    7.83 cm/s LV PW:         0.90 cm   LV E/e' medial:  10.4 LV IVS:        0.90 cm   LV e' lateral:   8.59 cm/s LVOT diam:     1.80 cm   LV E/e' lateral: 9.5 LV SV:         79 LV SV Index:   40 LVOT Area:     2.54 cm  RIGHT VENTRICLE RV S prime:     11.50 cm/s TAPSE (M-mode): 2.1 cm LEFT ATRIUM           Index        RIGHT ATRIUM           Index LA diam:      3.50 cm 1.78 cm/m   RA Area:     15.70 cm LA Vol (A4C): 72.9 ml 37.13 ml/m  RA Volume:   46.10 ml  23.48 ml/m  AORTIC VALVE LVOT Vmax:   135.00 cm/s LVOT Vmean:  88.900 cm/s LVOT VTI:    0.312 m  AORTA Ao Root diam: 3.30 cm MITRAL VALVE MV Area (PHT): 4.06 cm     SHUNTS MV Decel Time: 187 msec     Systemic VTI:  0.31 m MV E velocity: 81.80 cm/s   Systemic Diam: 1.80 cm MV A velocity: 101.00 cm/s MV E/A ratio:  0.81 Vishnu Priya Mallipeddi Electronically signed by Lorelee Cover Mallipeddi Signature Date/Time: 08/22/2022/1:18:59 PM    Final    DG Chest Portable 1 View  Result Date: 08/21/2022 CLINICAL DATA:  Shortness of breath, found to be in SVT. EXAM:  PORTABLE CHEST 1 VIEW COMPARISON:  Chest radiograph 08/20/2022 FINDINGS: The cardiomediastinal silhouette is stable. Left basilar atelectasis/scar is again seen. There is no new or worsening focal airspace disease. There is no pulmonary edema. There is no significant pleural effusion. There is no pneumothorax There is no acute osseous abnormality. IMPRESSION: Stable chest with no radiographic evidence of acute cardiopulmonary process. Electronically Signed   By: Valetta Mole M.D.   On: 08/21/2022 16:30   DG Chest 2 View  Result Date: 08/20/2022 CLINICAL DATA:  Chest pain EXAM: CHEST - 2 VIEW COMPARISON:  08/10/2022 FINDINGS: Cardiac shadow is stable. Lungs are clear bilaterally. Some chronic scarring in the left base is noted. No bony abnormality is seen. IMPRESSION: No acute abnormality noted. Electronically Signed   By: Inez Catalina M.D.   On: 08/20/2022 01:27   DG ABD ACUTE 2+V W 1V CHEST  Result Date: 08/13/2022 CLINICAL DATA:  Abdominal pain with constipated feeling EXAM: DG ABDOMEN ACUTE WITH 1 VIEW CHEST COMPARISON:  08/10/2022 FINDINGS: No abnormal stool retention. A few proximal colonic fluid levels are noted, which may be in normal limits. No gas dilated small bowel for obstruction. No concerning mass effect or calcification. Prostate fiducial markers. Scoliosis. Scar-like density over the lower lobes also seen on prior abdominal CT. Normal heart size and mediastinal contours. No edema or consolidation. IMPRESSION: No abnormal stool retention.  No dilated bowel to imply obstruction. Electronically Signed   By: Jorje Guild M.D.   On: 08/13/2022 04:31   DG Chest 2 View  Result Date: 08/10/2022 CLINICAL DATA:  Dizziness. EXAM: CHEST - 2 VIEW COMPARISON:  08/01/2022 FINDINGS: AP and lateral views of the chest show low lung volumes. The cardio pericardial silhouette is enlarged. There is pulmonary vascular congestion without overt pulmonary edema.  Possible left base atelectasis or infiltrate  with potential tiny left pleural effusion. The visualized bony structures of the thorax are unremarkable. Telemetry leads overlie the chest. IMPRESSION: Low volume film with vascular congestion and possible left base atelectasis or infiltrate with potential tiny left pleural effusion. Electronically Signed   By: Misty Stanley M.D.   On: 08/10/2022 11:46   DG Chest 2 View  Result Date: 08/01/2022 CLINICAL DATA:  Dyspnea, chest tightness, shortness of breath and palpitations. EXAM: CHEST - 2 VIEW COMPARISON:  PA Lat 06/28/2022 FINDINGS: The cardiomediastinal silhouette and vascular pattern are normal. Eventrations are again noted along both hemidiaphragms and scattered linear atelectasis in the bases. The lungs are clear of infiltrates. The sulci are sharp. There is osteopenia, mild thoracic kyphosis, and multilevel thoracic bridging enthesopathy. IMPRESSION: 1. No evidence of acute chest disease.  Stable chest. 2. Osteopenia and multilevel thoracic bridging enthesopathy. Electronically Signed   By: Telford Nab M.D.   On: 08/01/2022 07:43    Microbiology: Results for orders placed or performed during the hospital encounter of 08/10/22  Resp panel by RT-PCR (RSV, Flu A&B, Covid) Anterior Nasal Swab     Status: None   Collection Time: 08/10/22 10:39 AM   Specimen: Anterior Nasal Swab  Result Value Ref Range Status   SARS Coronavirus 2 by RT PCR NEGATIVE NEGATIVE Final    Comment: (NOTE) SARS-CoV-2 target nucleic acids are NOT DETECTED.  The SARS-CoV-2 RNA is generally detectable in upper respiratory specimens during the acute phase of infection. The lowest concentration of SARS-CoV-2 viral copies this assay can detect is 138 copies/mL. A negative result does not preclude SARS-Cov-2 infection and should not be used as the sole basis for treatment or other patient management decisions. A negative result may occur with  improper specimen collection/handling, submission of specimen other than  nasopharyngeal swab, presence of viral mutation(s) within the areas targeted by this assay, and inadequate number of viral copies(<138 copies/mL). A negative result must be combined with clinical observations, patient history, and epidemiological information. The expected result is Negative.  Fact Sheet for Patients:  EntrepreneurPulse.com.au  Fact Sheet for Healthcare Providers:  IncredibleEmployment.be  This test is no t yet approved or cleared by the [place] FDA and  has been authorized for detection and/or diagnosis of SARS-CoV-2 by FDA under an Emergency Use Authorization (EUA). This EUA will remain  in effect (meaning this test can be used) for the duration of the COVID-19 declaration under Section 564(b)(1) of the Act, 21 U.S.C.section 360bbb-3(b)(1), unless the authorization is terminated  or revoked sooner.       Influenza A by PCR NEGATIVE NEGATIVE Final   Influenza B by PCR NEGATIVE NEGATIVE Final    Comment: (NOTE) The Xpert Xpress SARS-CoV-2/FLU/RSV plus assay is intended as an aid in the diagnosis of influenza from Nasopharyngeal swab specimens and should not be used as a sole basis for treatment. Nasal washings and aspirates are unacceptable for Xpert Xpress SARS-CoV-2/FLU/RSV testing.  Fact Sheet for Patients: EntrepreneurPulse.com.au  Fact Sheet for Healthcare Providers: IncredibleEmployment.be  This test is not yet approved or cleared by the [place] FDA and has been authorized for detection and/or diagnosis of SARS-CoV-2 by FDA under an Emergency Use Authorization (EUA). This EUA will remain in effect (meaning this test can be used) for the duration of the COVID-19 declaration under Section 564(b)(1) of the Act, 21 U.S.C. section 360bbb-3(b)(1), unless the authorization is terminated or revoked.     Resp Syncytial Virus by PCR  NEGATIVE NEGATIVE Final    Comment:  (NOTE) Fact Sheet for Patients: EntrepreneurPulse.com.au  Fact Sheet for Healthcare Providers: IncredibleEmployment.be  This test is not yet approved or cleared by the [place] FDA and has been authorized for detection and/or diagnosis of SARS-CoV-2 by FDA under an Emergency Use Authorization (EUA). This EUA will remain in effect (meaning this test can be used) for the duration of the COVID-19 declaration under Section 564(b)(1) of the Act, 21 U.S.C. section 360bbb-3(b)(1), unless the authorization is terminated or revoked.  Performed at [facility], [address] [place], [place] [address]     Labs: CBC: Recent Labs  Lab 08/17/22 1027 08/19/22 1342 08/20/22 0049 08/21/22 1523 08/22/22 0435 08/23/22 0427  WBC 2.5* 2.9* 3.8* 3.2* 3.7* 3.6*  NEUTROABS 1.1* 1.5* 1.8  --   --   --   HGB 14.0 13.4 13.4 13.8 12.3* 12.1*  HCT 41.4 39.9 40.3 41.6 37.6* 37.2*  MCV 89.0 89.3 89.8 89.1 90.4 91.4  PLT 239 222 249 239 224 160   Basic Metabolic Panel: Recent Labs  Lab 08/19/22 1342 08/20/22 0049 08/21/22 1523 08/22/22 0435 08/23/22 0427  NA 138 137 138 139 140  K 3.4* 3.3* 3.2* 3.3* 3.5  CL 102 102 103 108 108  CO2 '27 25 24 25 27  '$ GLUCOSE 120* 106* 123* 99 91  BUN <5* <5* <5* <5* <5*  CREATININE 0.81 0.86 0.75 0.76 0.86  CALCIUM 9.7 9.8 9.5 9.1 9.4  MG  --  1.9  --   --  2.0   Liver Function Tests: Recent Labs  Lab 08/17/22 1027 08/20/22 0049  AST 34 28  ALT 51* 38  ALKPHOS 46 39  BILITOT 0.7 0.6  PROT 7.3 6.7  ALBUMIN 4.1 3.6   CBG: Recent Labs  Lab 08/17/22 1016  GLUCAP 119*    Discharge time spent: greater than 30 minutes.  Signed: Barton Dubois, MD [facility] 08/23/2022

## 2022-08-23 NOTE — Progress Notes (Signed)
Patient is discharged, IV removed, just waiting on wife to pick him up.

## 2022-08-23 NOTE — Progress Notes (Addendum)
   Progress Note  Patient Name: Steven [name] Date of Encounter: 08/23/2022  Primary Cardiologist: Jenkins Rouge, MD  Telemetry reviewed, HR 50-60s and NSR  [facility] will sign off.   Medication Recommendations:  Continue Metoprolol tartarate 50 mg BID, Eliquis 5 mg BID. Discontinue Coreg upon discharge. Other recommendations (labs, testing, etc):  None Follow up as an outpatient:  Keep appointment with Dr Johnsie Cancel on 08/25/22  Signed, Chalmers Guest, MD  08/23/2022, 11:27 AM

## 2022-08-24 ENCOUNTER — Other Ambulatory Visit: Payer: Self-pay

## 2022-08-24 DIAGNOSIS — R197 Diarrhea, unspecified: Secondary | ICD-10-CM

## 2022-08-24 NOTE — Telephone Encounter (Signed)
Pt asking for an appt next week? He said he did the urine but we do not have the results. Please advise.

## 2022-08-25 ENCOUNTER — Encounter: Payer: Self-pay | Admitting: Cardiovascular Disease

## 2022-08-25 ENCOUNTER — Ambulatory Visit: Payer: 59 | Attending: Cardiovascular Disease | Admitting: Cardiovascular Disease

## 2022-08-25 VITALS — BP 124/77 | HR 72 | Ht 67.0 in | Wt 186.0 lb

## 2022-08-25 DIAGNOSIS — E0590 Thyrotoxicosis, unspecified without thyrotoxic crisis or storm: Secondary | ICD-10-CM

## 2022-08-25 DIAGNOSIS — I480 Paroxysmal atrial fibrillation: Secondary | ICD-10-CM | POA: Diagnosis not present

## 2022-08-25 DIAGNOSIS — R0789 Other chest pain: Secondary | ICD-10-CM

## 2022-08-25 DIAGNOSIS — E785 Hyperlipidemia, unspecified: Secondary | ICD-10-CM

## 2022-08-25 DIAGNOSIS — I10 Essential (primary) hypertension: Secondary | ICD-10-CM

## 2022-08-25 NOTE — Patient Instructions (Signed)
Medication Instructions:  Your physician recommends that you continue on your current medications as directed. Please refer to the Current Medication list given to you today.   Labwork: None  Testing/Procedures: None  Follow-Up: Follow up with Dr. Johnsie Cancel in 3 months.   Any Other Special Instructions Will Be Listed Below (If Applicable).     If you need a refill on your cardiac medications before your next appointment, please call your pharmacy.

## 2022-08-29 ENCOUNTER — Emergency Department (HOSPITAL_COMMUNITY): Payer: 59

## 2022-08-29 ENCOUNTER — Other Ambulatory Visit: Payer: Self-pay

## 2022-08-29 ENCOUNTER — Encounter (HOSPITAL_COMMUNITY): Payer: Self-pay

## 2022-08-29 ENCOUNTER — Telehealth: Payer: Self-pay | Admitting: Cardiovascular Disease

## 2022-08-29 ENCOUNTER — Emergency Department (HOSPITAL_COMMUNITY)
Admission: EM | Admit: 2022-08-29 | Discharge: 2022-08-29 | Disposition: A | Discharge status: Home / Self Care | Payer: 59 | Attending: Emergency Medicine | Admitting: Emergency Medicine

## 2022-08-29 DIAGNOSIS — Z79899 Other long term (current) drug therapy: Secondary | ICD-10-CM | POA: Diagnosis not present

## 2022-08-29 DIAGNOSIS — R110 Nausea: Secondary | ICD-10-CM | POA: Diagnosis not present

## 2022-08-29 DIAGNOSIS — E039 Hypothyroidism, unspecified: Secondary | ICD-10-CM | POA: Insufficient documentation

## 2022-08-29 DIAGNOSIS — R002 Palpitations: Secondary | ICD-10-CM | POA: Insufficient documentation

## 2022-08-29 DIAGNOSIS — I4891 Unspecified atrial fibrillation: Secondary | ICD-10-CM | POA: Diagnosis not present

## 2022-08-29 DIAGNOSIS — R079 Chest pain, unspecified: Secondary | ICD-10-CM | POA: Diagnosis not present

## 2022-08-29 DIAGNOSIS — Z91040 Latex allergy status: Secondary | ICD-10-CM | POA: Diagnosis not present

## 2022-08-29 DIAGNOSIS — Z8546 Personal history of malignant neoplasm of prostate: Secondary | ICD-10-CM | POA: Diagnosis not present

## 2022-08-29 DIAGNOSIS — I10 Essential (primary) hypertension: Secondary | ICD-10-CM | POA: Insufficient documentation

## 2022-08-29 DIAGNOSIS — Z743 Need for continuous supervision: Secondary | ICD-10-CM | POA: Diagnosis not present

## 2022-08-29 DIAGNOSIS — R059 Cough, unspecified: Secondary | ICD-10-CM | POA: Diagnosis not present

## 2022-08-29 DIAGNOSIS — R55 Syncope and collapse: Secondary | ICD-10-CM | POA: Diagnosis not present

## 2022-08-29 DIAGNOSIS — R6889 Other general symptoms and signs: Secondary | ICD-10-CM | POA: Diagnosis not present

## 2022-08-29 DIAGNOSIS — R0789 Other chest pain: Secondary | ICD-10-CM | POA: Diagnosis not present

## 2022-08-29 LAB — BASIC METABOLIC PANEL
Anion gap: 9 (ref 5–15)
BUN: 5 mg/dL — ABNORMAL LOW (ref 8–23)
CO2: 27 mmol/L (ref 22–32)
Calcium: 9.6 mg/dL (ref 8.9–10.3)
Chloride: 103 mmol/L (ref 98–111)
Creatinine, Ser: 0.84 mg/dL (ref 0.61–1.24)
GFR, Estimated: >60 mL/min (ref >60)
Glucose, Bld: 116 mg/dL — ABNORMAL HIGH (ref 70–99)
Potassium: 3 mmol/L — ABNORMAL LOW (ref 3.5–5.1)
Sodium: 139 mmol/L (ref 135–145)

## 2022-08-29 LAB — CBC
HCT: 38.7 % — ABNORMAL LOW (ref 39.0–52.0)
Hemoglobin: 12.8 g/dL — ABNORMAL LOW (ref 13.0–17.0)
MCH: 29.6 pg (ref 26.0–34.0)
MCHC: 33.1 g/dL (ref 30.0–36.0)
MCV: 89.4 fL (ref 80.0–100.0)
Platelets: 215 10*3/uL (ref 150–400)
RBC: 4.33 MIL/uL (ref 4.22–5.81)
RDW: 12.5 % (ref 11.5–15.5)
WBC: 3.2 10*3/uL — ABNORMAL LOW (ref 4.0–10.5)
nRBC: 0 % (ref 0.0–0.2)

## 2022-08-29 LAB — TROPONIN I (HIGH SENSITIVITY)
Troponin I (High Sensitivity): 7 ng/L (ref <18)
Troponin I (High Sensitivity): 8 ng/L (ref <18)

## 2022-08-29 MED ORDER — SODIUM CHLORIDE 0.9 % IV BOLUS
500.0000 mL | Freq: Once | INTRAVENOUS | Status: AC
  Administered 2022-08-29: 500 mL via INTRAVENOUS

## 2022-08-29 MED ORDER — POTASSIUM CHLORIDE CRYS ER 20 MEQ PO TBCR
20.0000 meq | EXTENDED_RELEASE_TABLET | Freq: Once | ORAL | Status: AC
  Administered 2022-08-29: 20 meq via ORAL
  Filled 2022-08-29: qty 1

## 2022-08-29 MED ORDER — POTASSIUM CHLORIDE CRYS ER 20 MEQ PO TBCR
60.0000 meq | EXTENDED_RELEASE_TABLET | Freq: Once | ORAL | Status: AC
  Administered 2022-08-29: 60 meq via ORAL
  Filled 2022-08-29: qty 3

## 2022-08-29 MED ORDER — METOCLOPRAMIDE HCL 5 MG/ML IJ SOLN
10.0000 mg | Freq: Once | INTRAMUSCULAR | Status: AC
  Administered 2022-08-29: 10 mg via INTRAVENOUS
  Filled 2022-08-29: qty 2

## 2022-08-29 NOTE — Telephone Encounter (Signed)
Pt c/o of Chest Pain: STAT if CP now or developed within 24 hours  1. Are you having CP right now? Yes   2. Are you experiencing any other symptoms (ex. SOB, nausea, vomiting, sweating)? SOB, nausea, left arm hurting  3. How long have you been experiencing CP? Yesterday  4. Is your CP continuous or coming and going? Coming and going  5. Have you taken Nitroglycerin? No  ?

## 2022-08-29 NOTE — Discharge Instructions (Addendum)
The workup in the emergency room is reassuring.  You have not had any arrhythmias, your labs are reassuring and heart enzymes are also normal.  Continue outpatient management. Your potassium is slightly low, you have received oral potassium medications.

## 2022-08-29 NOTE — ED Triage Notes (Signed)
Patient complains of feeling chest pain, weakness, SOB, and light headedness. He was seen here on the 30th.

## 2022-08-29 NOTE — ED Provider Notes (Signed)
[facility] Provider Note   CSN: [id number] Arrival date & time: 08/29/22  1120     History  Chief Complaint  Patient presents with   Chest Pain    Steven [name] is a 72 y.o. male.  HPI     72 year old patient comes in with chief complaint of chest pain, weakness, shortness of breath, lightheadedness.  He states that he has had chronic issues involving palpitations, thyroid problems, nausea, feeling weak.  Every time he tries to defecate, he starts feeling like he is going to pass out.  He had similar symptoms today and he decided to come to the emergency room.  Patient has history of A-fib, prostate cancer, hypothyroidism, hypertension, anxiety.  He was recently admitted to the hospital for A-fib with RVR.  He has subsequently seen cardiologist, he has a cardiac monitor on.  Cardiologist wants patient to complete the 30-day cardiac monitor.  He has seen endocrinologist, but he states that no significant changes were made by the endocrinologist.  Home Medications Prior to Admission medications   Medication Sig Start Date End Date Taking? Authorizing Provider  acetaminophen (TYLENOL) 500 MG tablet Take 500 mg by mouth every 6 (six) hours as needed for moderate pain.   Yes [provider]  alfuzosin (UROXATRAL) 10 MG 24 hr tablet Take 1 tablet (10 mg total) by mouth daily with breakfast. 04/07/22  Yes [name], Candee Furbish, MD  Cholecalciferol (VITAMIN D3) 125 MCG (5000 UT) CAPS Take 1 capsule by mouth daily.    Yes [provider]  ELIQUIS 5 MG TABS tablet TAKE 1 TABLET(5 MG) BY MOUTH TWICE DAILY Patient taking differently: Take 5 mg by mouth 2 (two) times daily. 04/24/22  Yes Josue Hector, MD  Erenumab-aooe (AIMOVIG) 70 MG/ML SOAJ Inject 1 mL into the skin every 30 (thirty) days.   Yes [provider]  gabapentin (NEURONTIN) 300 MG capsule Take 300 mg by mouth 3 (three) times daily as needed. 02/24/20  Yes  [provider]  hydrochlorothiazide (HYDRODIURIL) 25 MG tablet Take 25 mg by mouth daily. 07/28/19  Yes [provider]  losartan (COZAAR) 50 MG tablet Take 1.5 tablets (75 mg total) by mouth daily. 08/23/22  Yes Barton Dubois, MD  methimazole (TAPAZOLE) 10 MG tablet Take 1 tablet (10 mg total) by mouth 2 (two) times daily. 08/23/22  Yes Barton Dubois, MD  metoprolol tartrate (LOPRESSOR) 50 MG tablet Take 1 tablet (50 mg total) by mouth 2 (two) times daily. 08/23/22  Yes Barton Dubois, MD  NEXLETOL 180 MG TABS Take 180 mg by mouth daily. Pt taking 2-3 times a week 12/03/19  Yes [provider]  ondansetron (ZOFRAN-ODT) 4 MG disintegrating tablet Take 1 tablet (4 mg total) by mouth every 8 (eight) hours as needed for nausea or vomiting. 08/23/22  Yes Barton Dubois, MD  pantoprazole (PROTONIX) 40 MG tablet Take 1 tablet (40 mg total) by mouth daily. 08/23/22  Yes Barton Dubois, MD  polyethylene glycol (MIRALAX / GLYCOLAX) 17 g packet Take 17 g by mouth daily. 08/23/22  Yes Barton Dubois, MD  potassium chloride SA (KLOR-CON M) 20 MEQ tablet Take 1 tablet (20 mEq total) by mouth 2 (two) times daily. 08/23/22  Yes Barton Dubois, MD  Probiotic Product (PROBIOTIC-10 PO) Take 1 capsule by mouth daily.   Yes [provider]  vitamin B-12 (CYANOCOBALAMIN) 100 MCG tablet Take 100 mcg by mouth daily.   Yes [provider]  Allergies    Latex and Meclizine    Review of Systems   Review of Systems  All other systems reviewed and are negative.   Physical Exam Updated Vital Signs BP (!) 163/86 (BP Location: Left Arm)   Pulse (!) 59   Temp 98.2 F (36.8 C) (Oral)   Resp 20   Ht '5\' 7"'$  (1.702 m)   Wt 84.4 kg   SpO2 98%   BMI 29.13 kg/m  Physical Exam Vitals and nursing note reviewed.  Constitutional:      Appearance: He is well-developed.  HENT:     Head: Atraumatic.  Cardiovascular:     Rate and Rhythm: Normal rate.  Pulmonary:     Effort:  Pulmonary effort is normal.  Musculoskeletal:     Cervical back: Neck supple.  Skin:    General: Skin is warm.  Neurological:     Mental Status: He is alert and oriented to person, place, and time.     ED Results / Procedures / Treatments   Labs (all labs ordered are listed, but only abnormal results are displayed) Labs Reviewed  BASIC METABOLIC PANEL - Abnormal; Notable for the following components:      Result Value   Potassium 3.0 (*)    Glucose, Bld 116 (*)    BUN 5 (*)    All other components within normal limits  CBC - Abnormal; Notable for the following components:   WBC 3.2 (*)    Hemoglobin 12.8 (*)    HCT 38.7 (*)    All other components within normal limits  TROPONIN I (HIGH SENSITIVITY)  TROPONIN I (HIGH SENSITIVITY)    EKG EKG Interpretation  Date/Time:  Tuesday August 29 2022 11:28:40 EST Ventricular Rate:  66 PR Interval:  173 QRS Duration: 86 QT Interval:  392 QTC Calculation: 411 R Axis:   0 Text Interpretation: Sinus rhythm No acute changes No significant change since last tracing Confirmed by Varney Biles ([id number]) on 08/29/2022 12:55:37 PM  Radiology DG Chest Port 1 View  Result Date: 08/29/2022 CLINICAL DATA:  Chest pain and cough. EXAM: PORTABLE CHEST 1 VIEW COMPARISON:  AP chest 08/21/2022 FINDINGS: Cardiac silhouette and mediastinal contours are within normal limits. Moderately decreased lung volumes limit evaluation. The lungs appear clear. No significant pleural effusion is seen. No pneumothorax. Moderate multilevel degenerative disc changes of the thoracic spine. IMPRESSION: Mildly decreased lung volumes. No acute cardiopulmonary process. Electronically Signed   By: Yvonne Kendall M.D.   On: 08/29/2022 12:09    Procedures Procedures    Medications Ordered in ED Medications  potassium chloride SA (KLOR-CON M) CR tablet 20 mEq (has no administration in time range)  potassium chloride SA (KLOR-CON M) CR tablet 60 mEq (60 mEq Oral Given  08/29/22 1259)  metoCLOPramide (REGLAN) injection 10 mg (10 mg Intravenous Given 08/29/22 1300)  sodium chloride 0.9 % bolus 500 mL (0 mLs Intravenous Stopped 08/29/22 1357)    ED Course/ Medical Decision Making/ A&P                             Medical Decision Making Risk Prescription drug management.   72 year old patient comes in with multiple complaints, primarily is not feeling well, he had an episode of lightheadedness, shortness of breath, near fainting with some chest pain.  Patient has history of A-fib, thyroid disorder and he has a Holter monitor in place.  He also has history of anxiety disorder and has  had multiple ED visits for similar symptoms.  Patient was last seen on 1-29.  At that time he was found to be in A-fib, he spontaneously converted but he was still admitted to the hospital.  No additional concerning process found during that inpatient stay.  Currently patient is noted to be in stable condition.  Vital signs are within normal limits.  No tachydysrhythmia, no tachypnea, no hypoxia or hypotension.  His neuroexam is nonfocal.  He does not appear toxic.  Given patient's concern, we will get basic labs, give him some IV fluids. My suspicion is that most likely he had an episode of palpitations and he was symptomatic from it or he had anxiety attack.  If results are reassuring, we will discharge the patient.  Patient has been made aware of the plan at the time of initial assessment.  Final Clinical Impression(s) / ED Diagnoses Final diagnoses:  Near syncope  Palpitations    Rx / DC Orders ED Discharge Orders     None         Varney Biles, MD 08/29/22 1524

## 2022-08-29 NOTE — Telephone Encounter (Signed)
Steven [name] had phone visit with Dr.Nishan on 08/25/22. Was instructed to f/u in 3 months  Patient tells me he has had SOB for 2-3 months now but he said he did not mention this to [place].   He does not like taking metoprolol and wants something else.He was started on it after ER visit for SVT. He is still wearing his monitor.    Today he says he has severe SOB and pain in his chest radiating to his left arm and collar bone.Not relieved by anything.He also reports nausea.   I advised him to go to the ED and I will FYI Dr.Nishan

## 2022-08-30 ENCOUNTER — Telehealth (INDEPENDENT_AMBULATORY_CARE_PROVIDER_SITE_OTHER): Payer: Self-pay

## 2022-08-30 NOTE — Telephone Encounter (Signed)
Noted  

## 2022-08-30 NOTE — Telephone Encounter (Signed)
Hello, Patient called Steven [name] @ [facility] today stating he is having troubles with constipation. He says he has been on Miralax 17 gm QHS since 2012, and feels this is not helping as it once did. Patient says he is also taking two fiber gummies Qd as well. He says he is having issues also with nausea, and lower abdominal pains.(Cramping/ or feeling of stomach tied in [place] last night) Patient would like to know if he should try something else for the constipation, or increase the number of times he takes Miralax per day.If needs rx medication patient uses [address] [place], [place]. Please advise.

## 2022-08-30 NOTE — Telephone Encounter (Signed)
Increase MiraLAX to 17g twice daily in 8 oz of water to help with constipation. Continue fiber supplement. Abdominal pain likely related to constipation/IBS. Hopefully this will improve with better bowel regularity. For nausea, use Zofran 4 mg every 8 hours as needed. He should already have a prescription for this. He should also be taking pantoprazole 40 mg daily. Keep upcoming visit with Dr. Gala Romney.

## 2022-08-30 NOTE — Telephone Encounter (Signed)
Patient made aware to increase Miralax bid, continue fiber daily, zofran 4 mg prn, keep taking ppi, and keep appointment with Dr. Gala Romney. Patient states understanding.

## 2022-08-31 DIAGNOSIS — I4891 Unspecified atrial fibrillation: Secondary | ICD-10-CM | POA: Diagnosis not present

## 2022-08-31 DIAGNOSIS — E876 Hypokalemia: Secondary | ICD-10-CM | POA: Diagnosis not present

## 2022-08-31 DIAGNOSIS — I10 Essential (primary) hypertension: Secondary | ICD-10-CM | POA: Diagnosis not present

## 2022-08-31 DIAGNOSIS — K529 Noninfective gastroenteritis and colitis, unspecified: Secondary | ICD-10-CM | POA: Diagnosis not present

## 2022-08-31 DIAGNOSIS — Z0189 Encounter for other specified special examinations: Secondary | ICD-10-CM | POA: Diagnosis not present

## 2022-08-31 DIAGNOSIS — R232 Flushing: Secondary | ICD-10-CM | POA: Diagnosis not present

## 2022-08-31 DIAGNOSIS — I700 Atherosclerosis of aorta: Secondary | ICD-10-CM | POA: Diagnosis not present

## 2022-09-03 ENCOUNTER — Other Ambulatory Visit: Payer: Self-pay

## 2022-09-03 ENCOUNTER — Emergency Department (HOSPITAL_COMMUNITY)
Admission: EM | Admit: 2022-09-03 | Discharge: 2022-09-04 | Disposition: A | Discharge status: Home / Self Care | Payer: 59 | Source: Home / Self Care | Attending: Emergency Medicine | Admitting: Emergency Medicine

## 2022-09-03 ENCOUNTER — Emergency Department (HOSPITAL_COMMUNITY)
Admission: EM | Admit: 2022-09-03 | Discharge: 2022-09-03 | Disposition: A | Discharge status: Home / Self Care | Payer: 59 | Attending: Emergency Medicine | Admitting: Emergency Medicine

## 2022-09-03 ENCOUNTER — Encounter (HOSPITAL_COMMUNITY): Payer: Self-pay | Admitting: Emergency Medicine

## 2022-09-03 DIAGNOSIS — Z79899 Other long term (current) drug therapy: Secondary | ICD-10-CM | POA: Insufficient documentation

## 2022-09-03 DIAGNOSIS — D72819 Decreased white blood cell count, unspecified: Secondary | ICD-10-CM

## 2022-09-03 DIAGNOSIS — Z7901 Long term (current) use of anticoagulants: Secondary | ICD-10-CM

## 2022-09-03 DIAGNOSIS — I10 Essential (primary) hypertension: Secondary | ICD-10-CM | POA: Insufficient documentation

## 2022-09-03 DIAGNOSIS — D509 Iron deficiency anemia, unspecified: Secondary | ICD-10-CM | POA: Insufficient documentation

## 2022-09-03 DIAGNOSIS — Z91040 Latex allergy status: Secondary | ICD-10-CM | POA: Insufficient documentation

## 2022-09-03 DIAGNOSIS — I4891 Unspecified atrial fibrillation: Secondary | ICD-10-CM | POA: Diagnosis not present

## 2022-09-03 DIAGNOSIS — R6889 Other general symptoms and signs: Secondary | ICD-10-CM | POA: Diagnosis not present

## 2022-09-03 DIAGNOSIS — R7309 Other abnormal glucose: Secondary | ICD-10-CM | POA: Insufficient documentation

## 2022-09-03 DIAGNOSIS — Z743 Need for continuous supervision: Secondary | ICD-10-CM | POA: Diagnosis not present

## 2022-09-03 DIAGNOSIS — R7989 Other specified abnormal findings of blood chemistry: Secondary | ICD-10-CM | POA: Diagnosis not present

## 2022-09-03 DIAGNOSIS — D649 Anemia, unspecified: Secondary | ICD-10-CM

## 2022-09-03 DIAGNOSIS — I499 Cardiac arrhythmia, unspecified: Secondary | ICD-10-CM | POA: Diagnosis not present

## 2022-09-03 DIAGNOSIS — E039 Hypothyroidism, unspecified: Secondary | ICD-10-CM | POA: Insufficient documentation

## 2022-09-03 DIAGNOSIS — D72829 Elevated white blood cell count, unspecified: Secondary | ICD-10-CM | POA: Diagnosis not present

## 2022-09-03 DIAGNOSIS — Z8546 Personal history of malignant neoplasm of prostate: Secondary | ICD-10-CM | POA: Insufficient documentation

## 2022-09-03 DIAGNOSIS — R079 Chest pain, unspecified: Secondary | ICD-10-CM | POA: Diagnosis not present

## 2022-09-03 DIAGNOSIS — I160 Hypertensive urgency: Secondary | ICD-10-CM | POA: Diagnosis not present

## 2022-09-03 LAB — CBC WITH DIFFERENTIAL/PLATELET
Abs Immature Granulocytes: 0 10*3/uL (ref 0.00–0.07)
Basophils Absolute: 0 10*3/uL (ref 0.0–0.1)
Basophils Relative: 1 %
Eosinophils Absolute: 0.1 10*3/uL (ref 0.0–0.5)
Eosinophils Relative: 2 %
HCT: 36.5 % — ABNORMAL LOW (ref 39.0–52.0)
Hemoglobin: 12.2 g/dL — ABNORMAL LOW (ref 13.0–17.0)
Immature Granulocytes: 0 %
Lymphocytes Relative: 49 %
Lymphs Abs: 1.7 10*3/uL (ref 0.7–4.0)
MCH: 30 pg (ref 26.0–34.0)
MCHC: 33.4 g/dL (ref 30.0–36.0)
MCV: 89.9 fL (ref 80.0–100.0)
Monocytes Absolute: 0.4 10*3/uL (ref 0.1–1.0)
Monocytes Relative: 11 %
Neutro Abs: 1.3 10*3/uL — ABNORMAL LOW (ref 1.7–7.7)
Neutrophils Relative %: 37 %
Platelets: 204 10*3/uL (ref 150–400)
RBC: 4.06 MIL/uL — ABNORMAL LOW (ref 4.22–5.81)
RDW: 12.6 % (ref 11.5–15.5)
WBC: 3.4 10*3/uL — ABNORMAL LOW (ref 4.0–10.5)
nRBC: 0 % (ref 0.0–0.2)

## 2022-09-03 LAB — BASIC METABOLIC PANEL
Anion gap: 7 (ref 5–15)
BUN: 7 mg/dL — ABNORMAL LOW (ref 8–23)
CO2: 29 mmol/L (ref 22–32)
Calcium: 9.7 mg/dL (ref 8.9–10.3)
Chloride: 103 mmol/L (ref 98–111)
Creatinine, Ser: 0.95 mg/dL (ref 0.61–1.24)
GFR, Estimated: >60 mL/min (ref >60)
Glucose, Bld: 99 mg/dL (ref 70–99)
Potassium: 3.5 mmol/L (ref 3.5–5.1)
Sodium: 139 mmol/L (ref 135–145)

## 2022-09-03 LAB — TROPONIN I (HIGH SENSITIVITY): Troponin I (High Sensitivity): 9 ng/L (ref <18)

## 2022-09-03 NOTE — ED Provider Notes (Incomplete)
[facility] Provider Note   CSN: [id number] Arrival date & time: 09/03/22  2335     History {Add pertinent medical, surgical, social history, OB history to HPI:1} Chief Complaint  Patient presents with  . Chest Pain    Steven [name] is a 72 y.o. male.  The history is provided by the patient.  Chest Pain He has history of hypertension, hyperlipidemia, atrial fibrillation anticoagulated on apixaban    Home Medications Prior to Admission medications   Medication Sig Start Date End Date Taking? Authorizing Provider  acetaminophen (TYLENOL) 500 MG tablet Take 500 mg by mouth every 6 (six) hours as needed for moderate pain.    [provider]  alfuzosin (UROXATRAL) 10 MG 24 hr tablet Take 1 tablet (10 mg total) by mouth daily with breakfast. 04/07/22   [name], Candee Furbish, MD  Cholecalciferol (VITAMIN D3) 125 MCG (5000 UT) CAPS Take 1 capsule by mouth daily.     [provider]  ELIQUIS 5 MG TABS tablet TAKE 1 TABLET(5 MG) BY MOUTH TWICE DAILY Patient taking differently: Take 5 mg by mouth 2 (two) times daily. 04/24/22   Josue Hector, MD  Erenumab-aooe (AIMOVIG) 70 MG/ML SOAJ Inject 1 mL into the skin every 30 (thirty) days.    [provider]  gabapentin (NEURONTIN) 300 MG capsule Take 300 mg by mouth 3 (three) times daily as needed. 02/24/20   [provider]  hydrochlorothiazide (HYDRODIURIL) 25 MG tablet Take 25 mg by mouth daily. 07/28/19   [provider]  losartan (COZAAR) 50 MG tablet Take 1.5 tablets (75 mg total) by mouth daily. 08/23/22   Barton Dubois, MD  methimazole (TAPAZOLE) 10 MG tablet Take 1 tablet (10 mg total) by mouth 2 (two) times daily. 08/23/22   Barton Dubois, MD  metoprolol tartrate (LOPRESSOR) 50 MG tablet Take 1 tablet (50 mg total) by mouth 2 (two) times daily. 08/23/22   Barton Dubois, MD  NEXLETOL 180 MG TABS Take 180 mg by mouth daily. Pt taking 2-3 times a week  12/03/19   [provider]  ondansetron (ZOFRAN-ODT) 4 MG disintegrating tablet Take 1 tablet (4 mg total) by mouth every 8 (eight) hours as needed for nausea or vomiting. 08/23/22   Barton Dubois, MD  pantoprazole (PROTONIX) 40 MG tablet Take 1 tablet (40 mg total) by mouth daily. 08/23/22   Barton Dubois, MD  polyethylene glycol (MIRALAX / GLYCOLAX) 17 g packet Take 17 g by mouth daily. 08/23/22   Barton Dubois, MD  potassium chloride SA (KLOR-CON M) 20 MEQ tablet Take 1 tablet (20 mEq total) by mouth 2 (two) times daily. 08/23/22   Barton Dubois, MD  Probiotic Product (PROBIOTIC-10 PO) Take 1 capsule by mouth daily.    [provider]  vitamin B-12 (CYANOCOBALAMIN) 100 MCG tablet Take 100 mcg by mouth daily.    [provider]      Allergies    Latex and Meclizine    Review of Systems   Review of Systems  Cardiovascular:  Positive for chest pain.  All other systems reviewed and are negative.   Physical Exam Updated Vital Signs BP (!) 170/75   Pulse (!) 54   Temp 98.3 F (36.8 C) (Oral)   Resp 16   Ht 5' 7"$  (1.702 m)   Wt 84 kg   SpO2 100%   BMI 29.00 kg/m  Physical Exam Vitals and nursing note reviewed.   72 year old male, resting  comfortably and in no acute distress. Vital signs are ***. Oxygen saturation is ***%, which is normal. Head is normocephalic and atraumatic. PERRLA, EOMI. Oropharynx is clear. Neck is nontender and supple without adenopathy or JVD. Back is nontender and there is no CVA tenderness. Lungs are clear without rales, wheezes, or rhonchi. Chest is nontender. Heart has regular rate and rhythm without murmur. Abdomen is soft, flat, nontender without masses or hepatosplenomegaly and peristalsis is normoactive. Extremities have no cyanosis or edema, full range of motion is present. Skin is warm and dry without rash. Neurologic: Mental status is normal, cranial nerves are intact, there are no motor or sensory deficits.  ED  Results / Procedures / Treatments   Labs (all labs ordered are listed, but only abnormal results are displayed) Labs Reviewed - No data to display  EKG None  Radiology No results found.  Procedures Procedures  {Document cardiac monitor, telemetry assessment procedure when appropriate:1}  Medications Ordered in ED Medications - No data to display  ED Course/ Medical Decision Making/ A&P   {   Click here for ABCD2, HEART and other calculatorsREFRESH Note before signing :1}                          Medical Decision Making  ***  {Document critical care time when appropriate:1} {Document review of labs and clinical decision tools ie heart score, Chads2Vasc2 etc:1}  {Document your independent review of radiology images, and any outside records:1} {Document your discussion with family members, caretakers, and with consultants:1} {Document social determinants of health affecting pt's care:1} {Document your decision making why or why not admission, treatments were needed:1} Final Clinical Impression(s) / ED Diagnoses Final diagnoses:  None    Rx / DC Orders ED Discharge Orders     None

## 2022-09-03 NOTE — ED Provider Notes (Signed)
[facility] Provider Note   CSN: [id number] Arrival date & time: 09/03/22  2335     History  Chief Complaint  Patient presents with   Chest Pain    Steven [name] is a 72 y.o. male.  The history is provided by the patient.  Chest Pain He has history of hypertension, hyperlipidemia, atrial fibrillation anticoagulated on apixaban and comes in with multiple complaints.  His main presenting complaint was that his blood pressure keeps going up in the evening.  He states that it is low in the morning and gets high in the evening.  He states it was up to 170 tonight.  He explains that his blood pressure medication had been changed recently.  He also relates that he has an overactive thyroid and has been having numbness in his left hand and his left foot recently.  He complains of difficulty having bowel movements and he frequently feels lightheaded and nauseous following a bowel movement and will sometimes have chest pain following a bowel movement, and will also have a headache following a bowel movement.  He did note some fluttering in his chest today.  He denies chest pains currently.  He denies nausea or vomiting currently.  He denies headache currently.    Home Medications Prior to Admission medications   Medication Sig Start Date End Date Taking? Authorizing Provider  acetaminophen (TYLENOL) 500 MG tablet Take 500 mg by mouth every 6 (six) hours as needed for moderate pain.    [provider]  alfuzosin (UROXATRAL) 10 MG 24 hr tablet Take 1 tablet (10 mg total) by mouth daily with breakfast. 04/07/22   [name], Candee Furbish, MD  Cholecalciferol (VITAMIN D3) 125 MCG (5000 UT) CAPS Take 1 capsule by mouth daily.     [provider]  ELIQUIS 5 MG TABS tablet TAKE 1 TABLET(5 MG) BY MOUTH TWICE DAILY Patient taking differently: Take 5 mg by mouth 2 (two) times daily. 04/24/22   Josue Hector, MD  Erenumab-aooe (AIMOVIG) 70 MG/ML  SOAJ Inject 1 mL into the skin every 30 (thirty) days.    [provider]  gabapentin (NEURONTIN) 300 MG capsule Take 300 mg by mouth 3 (three) times daily as needed. 02/24/20   [provider]  hydrochlorothiazide (HYDRODIURIL) 25 MG tablet Take 25 mg by mouth daily. 07/28/19   [provider]  losartan (COZAAR) 50 MG tablet Take 1.5 tablets (75 mg total) by mouth daily. 08/23/22   Barton Dubois, MD  methimazole (TAPAZOLE) 10 MG tablet Take 1 tablet (10 mg total) by mouth 2 (two) times daily. 08/23/22   Barton Dubois, MD  metoprolol tartrate (LOPRESSOR) 50 MG tablet Take 1 tablet (50 mg total) by mouth 2 (two) times daily. 08/23/22   Barton Dubois, MD  NEXLETOL 180 MG TABS Take 180 mg by mouth daily. Pt taking 2-3 times a week 12/03/19   [provider]  ondansetron (ZOFRAN-ODT) 4 MG disintegrating tablet Take 1 tablet (4 mg total) by mouth every 8 (eight) hours as needed for nausea or vomiting. 08/23/22   Barton Dubois, MD  pantoprazole (PROTONIX) 40 MG tablet Take 1 tablet (40 mg total) by mouth daily. 08/23/22   Barton Dubois, MD  polyethylene glycol (MIRALAX / GLYCOLAX) 17 g packet Take 17 g by mouth daily. 08/23/22   Barton Dubois, MD  potassium chloride SA (KLOR-CON M) 20 MEQ tablet Take 1 tablet (20 mEq total) by mouth 2 (two) times daily. 08/23/22  Barton Dubois, MD  Probiotic Product (PROBIOTIC-10 PO) Take 1 capsule by mouth daily.    [provider]  vitamin B-12 (CYANOCOBALAMIN) 100 MCG tablet Take 100 mcg by mouth daily.    [provider]      Allergies    Latex and Meclizine    Review of Systems   Review of Systems  Cardiovascular:  Positive for chest pain.  All other systems reviewed and are negative.   Physical Exam Updated Vital Signs BP (!) 170/75   Pulse (!) 54   Temp 98.3 F (36.8 C) (Oral)   Resp 16   Ht 5' 7"$  (1.702 m)   Wt 84 kg   SpO2 100%   BMI 29.00 kg/m  Physical Exam Vitals and nursing note reviewed.    72 year old male, resting comfortably and in no acute distress. Vital signs are significant for slightly slow heart rate and elevated blood pressure. Oxygen saturation is 100%, which is normal. Head is normocephalic and atraumatic. PERRLA, EOMI. Oropharynx is clear. Neck is nontender and supple without adenopathy or JVD. Back is nontender and there is no CVA tenderness. Lungs are clear without rales, wheezes, or rhonchi. Chest is nontender. Heart has regular rate and rhythm without murmur. Abdomen is soft, flat, nontender. Extremities have no cyanosis or edema, full range of motion is present. Skin is warm and dry without rash. Neurologic: Mental status is normal, cranial nerves are intact, moves all extremities equally.  No objective sensory deficit.  ED Results / Procedures / Treatments   Labs (all labs ordered are listed, but only abnormal results are displayed) Labs Reviewed - No data to display  EKG EKG Interpretation  Date/Time:  Sunday September 03 2022 23:50:55 EST Ventricular Rate:  53 PR Interval:  175 QRS Duration: 86 QT Interval:  428 QTC Calculation: 402 R Axis:   -7 Text Interpretation: Sinus rhythm Borderline T abnormalities, inferior leads Minimal ST elevation, anterior leads When compared with ECG of EARLIER SAME DATE No significant change was found Confirmed by Delora Fuel ([id number]) on 09/03/2022 11:54:30 PM  Radiology No results found.  Procedures Procedures  Cardiac monitor shows normal sinus rhythm, per my interpretation.  Medications Ordered in ED Medications - No data to display  ED Course/ Medical Decision Making/ A&P                             Medical Decision Making Amount and/or Complexity of Data Reviewed Labs: ordered.   Elevated blood pressure and patient with history of hypertension.  Blood pressure is not dangerously high and there is no evidence of any endorgan damage, no indication for emergent treatment of blood pressure.  He has  multiple other complaints.  I have reviewed his past records and he was seen in the emergency department yesterday with also complaints of blood pressure at which time laboratory workup was unremarkable.  At this point, his main issue seems to be anxiety.  I have reviewed and interpreted his electrocardiogram and my interpretation is sinus bradycardia with borderline ST-T changes unchanged from prior.  I have ordered screening labs of CBC, comprehensive metabolic panel, single troponin.  I have encouraged him to work with his primary care provider regarding his blood pressure, advised that outside of extreme cases there is little role of the emergency department and blood pressure management.  Blood pressure has come down to 141/66 with simple observation and no medication.  I have reviewed and  interpreted his laboratory results and my interpretation is borderline elevated random glucose level, normal troponin, leukopenia and anemia which are stable.  I have reassured the patient that ED workup shows no evidence of any acute process.  Some symptoms may be related to ongoing hypothyroidism and he will need to work with his endocrinologist regarding management of that.  I have encouraged him to check his blood pressure 4 times a day and keep a record of it and take that record with him when he sees his primary care provider who can make appropriate adjustments in his blood pressure medication at that time.  I did explain to him that eventually I feel he will only need to check his blood pressure once or twice a day.  Final Clinical Impression(s) / ED Diagnoses Final diagnoses:  Elevated blood pressure reading with diagnosis of hypertension  Leukopenia, unspecified type  Normochromic normocytic anemia  Elevated random blood glucose level  Nonspecific chest pain  Chronic anticoagulation    Rx / DC Orders ED Discharge Orders     None         Delora Fuel, MD [id number]

## 2022-09-03 NOTE — Discharge Instructions (Signed)
Keep a record of your blood pressures at home and take this with you to your next doctor's appointment.  Continue medications as previously prescribed.

## 2022-09-03 NOTE — ED Triage Notes (Signed)
Pt with c/o HTN and chest pain. Was seen here last night for same.

## 2022-09-03 NOTE — ED Triage Notes (Signed)
Pt states his BP has been reading high (171/100) at home. Pt also c/o weakness, sob, and headache.

## 2022-09-03 NOTE — ED Provider Notes (Signed)
[facility] Provider Note   CSN: [id number] Arrival date & time: 09/03/22  0202     History  Chief Complaint  Patient presents with   Hypertension    Steven [name] is a 72 y.o. male.  Patient is a 72 year old male with past medical history of hypertension, prostate cancer, atrial fibrillation on Eliquis, irritable bowel, hypothyroidism.  Patient presenting today with complaints of elevated blood pressure.  He describes blood pressures being 170s since approximately 7:00 yesterday evening.  He took a dose of his metoprolol and has been checking his blood pressure consistently since.  He was concerned that the number was not coming down so presents for evaluation of this.  Patient has been here multiple times in the past with a similar presentation.  He tells me that he feels weak, but denies any chest pain.  The history is provided by the patient.       Home Medications Prior to Admission medications   Medication Sig Start Date End Date Taking? Authorizing Provider  acetaminophen (TYLENOL) 500 MG tablet Take 500 mg by mouth every 6 (six) hours as needed for moderate pain.    [provider]  alfuzosin (UROXATRAL) 10 MG 24 hr tablet Take 1 tablet (10 mg total) by mouth daily with breakfast. 04/07/22   [name], Candee Furbish, MD  Cholecalciferol (VITAMIN D3) 125 MCG (5000 UT) CAPS Take 1 capsule by mouth daily.     [provider]  ELIQUIS 5 MG TABS tablet TAKE 1 TABLET(5 MG) BY MOUTH TWICE DAILY Patient taking differently: Take 5 mg by mouth 2 (two) times daily. 04/24/22   Josue Hector, MD  Erenumab-aooe (AIMOVIG) 70 MG/ML SOAJ Inject 1 mL into the skin every 30 (thirty) days.    [provider]  gabapentin (NEURONTIN) 300 MG capsule Take 300 mg by mouth 3 (three) times daily as needed. 02/24/20   [provider]  hydrochlorothiazide (HYDRODIURIL) 25 MG tablet Take 25 mg by mouth daily. 07/28/19   [provider]  losartan (COZAAR) 50 MG tablet Take 1.5 tablets (75 mg total) by mouth daily. 08/23/22   Barton Dubois, MD  methimazole (TAPAZOLE) 10 MG tablet Take 1 tablet (10 mg total) by mouth 2 (two) times daily. 08/23/22   Barton Dubois, MD  metoprolol tartrate (LOPRESSOR) 50 MG tablet Take 1 tablet (50 mg total) by mouth 2 (two) times daily. 08/23/22   Barton Dubois, MD  NEXLETOL 180 MG TABS Take 180 mg by mouth daily. Pt taking 2-3 times a week 12/03/19   [provider]  ondansetron (ZOFRAN-ODT) 4 MG disintegrating tablet Take 1 tablet (4 mg total) by mouth every 8 (eight) hours as needed for nausea or vomiting. 08/23/22   Barton Dubois, MD  pantoprazole (PROTONIX) 40 MG tablet Take 1 tablet (40 mg total) by mouth daily. 08/23/22   Barton Dubois, MD  polyethylene glycol (MIRALAX / GLYCOLAX) 17 g packet Take 17 g by mouth daily. 08/23/22   Barton Dubois, MD  potassium chloride SA (KLOR-CON M) 20 MEQ tablet Take 1 tablet (20 mEq total) by mouth 2 (two) times daily. 08/23/22   Barton Dubois, MD  Probiotic Product (PROBIOTIC-10 PO) Take 1 capsule by mouth daily.    [provider]  vitamin B-12 (CYANOCOBALAMIN) 100 MCG tablet Take 100 mcg by mouth daily.    [provider]      Allergies    Latex and Meclizine    Review of Systems  Review of Systems  All other systems reviewed and are negative.   Physical Exam Updated Vital Signs BP (!) 157/87 (BP Location: Left Arm)   Pulse (!) 54   Temp 98.3 F (36.8 C) (Oral)   Resp 20   Ht 5' 7"$  (1.702 m)   Wt 84 kg   SpO2 100%   BMI 29.00 kg/m  Physical Exam Vitals and nursing note reviewed.  Constitutional:      General: He is not in acute distress.    Appearance: He is well-developed. He is not diaphoretic.  HENT:     Head: Normocephalic and atraumatic.  Eyes:     Pupils: Pupils are equal, round, and reactive to light.  Cardiovascular:     Rate and Rhythm: Normal rate and regular rhythm.     Heart  sounds: No murmur heard.    No friction rub.  Pulmonary:     Effort: Pulmonary effort is normal. No respiratory distress.     Breath sounds: Normal breath sounds. No wheezing or rales.  Abdominal:     General: Bowel sounds are normal. There is no distension.     Palpations: Abdomen is soft.     Tenderness: There is no abdominal tenderness.  Musculoskeletal:        General: Normal range of motion.     Cervical back: Normal range of motion and neck supple.  Skin:    General: Skin is warm and dry.  Neurological:     General: No focal deficit present.     Mental Status: He is alert and oriented to person, place, and time.     Cranial Nerves: No cranial nerve deficit.     Coordination: Coordination normal.     ED Results / Procedures / Treatments   Labs (all labs ordered are listed, but only abnormal results are displayed) Labs Reviewed  BASIC METABOLIC PANEL  CBC WITH DIFFERENTIAL/PLATELET  TROPONIN I (HIGH SENSITIVITY)    EKG EKG Interpretation  Date/Time:  Sunday September 03 2022 02:57:07 EST Ventricular Rate:  53 PR Interval:  171 QRS Duration: 82 QT Interval:  424 QTC Calculation: 398 R Axis:   -11 Text Interpretation: Sinus rhythm Low voltage, precordial leads Posterior infarct, old Nonspecific T abnormalities, inferior leads Unchanged from 08/29/2022 Confirmed by Veryl Speak [phone]) on 09/03/2022 3:00:40 AM  Radiology No results found.  Procedures Procedures    Medications Ordered in ED Medications - No data to display  ED Course/ Medical Decision Making/ A&P  Patient is a 72 year old male with history of hypertension and medical history as per HPI presenting with complaints of elevated blood pressure.  Patient arrives here with stable vital signs and blood pressure in the [id number] systolic range.  Workup initiated into the patient's complaints including CBC, metabolic panel, and troponin, all of which are unremarkable and consistent with baseline.  EKG shows a  sinus bradycardia with no changes from prior studies.  Patient has been observed here for 2 hours and his blood pressures have not significantly been elevated.  The highest reading I have gotten has been 0000000 systolic.  Patient has been advised that these are numbers that do not require a visit to the emergency department, but he should keep a record of them and take this to his primary doctor for medication adjustments.  Final Clinical Impression(s) / ED Diagnoses Final diagnoses:  None    Rx / DC Orders ED Discharge Orders     None  Veryl Speak, MD 09/03/22 [phone]

## 2022-09-04 ENCOUNTER — Telehealth: Payer: Self-pay

## 2022-09-04 LAB — COMPREHENSIVE METABOLIC PANEL
ALT: 24 U/L (ref 0–44)
AST: 20 U/L (ref 15–41)
Albumin: 3.6 g/dL (ref 3.5–5.0)
Alkaline Phosphatase: 44 U/L (ref 38–126)
Anion gap: 10 (ref 5–15)
BUN: <5 mg/dL — ABNORMAL LOW (ref 8–23)
CO2: 27 mmol/L (ref 22–32)
Calcium: 9.8 mg/dL (ref 8.9–10.3)
Chloride: 102 mmol/L (ref 98–111)
Creatinine, Ser: 0.84 mg/dL (ref 0.61–1.24)
GFR, Estimated: >60 mL/min (ref >60)
Glucose, Bld: 103 mg/dL — ABNORMAL HIGH (ref 70–99)
Potassium: 3.7 mmol/L (ref 3.5–5.1)
Sodium: 139 mmol/L (ref 135–145)
Total Bilirubin: 0.8 mg/dL (ref 0.3–1.2)
Total Protein: 6.4 g/dL — ABNORMAL LOW (ref 6.5–8.1)

## 2022-09-04 LAB — CBC WITH DIFFERENTIAL/PLATELET
Abs Immature Granulocytes: 0 10*3/uL (ref 0.00–0.07)
Basophils Absolute: 0 10*3/uL (ref 0.0–0.1)
Basophils Relative: 1 %
Eosinophils Absolute: 0.1 10*3/uL (ref 0.0–0.5)
Eosinophils Relative: 2 %
HCT: 38 % — ABNORMAL LOW (ref 39.0–52.0)
Hemoglobin: 12.7 g/dL — ABNORMAL LOW (ref 13.0–17.0)
Immature Granulocytes: 0 %
Lymphocytes Relative: 45 %
Lymphs Abs: 1.5 10*3/uL (ref 0.7–4.0)
MCH: 30.1 pg (ref 26.0–34.0)
MCHC: 33.4 g/dL (ref 30.0–36.0)
MCV: 90 fL (ref 80.0–100.0)
Monocytes Absolute: 0.3 10*3/uL (ref 0.1–1.0)
Monocytes Relative: 9 %
Neutro Abs: 1.4 10*3/uL — ABNORMAL LOW (ref 1.7–7.7)
Neutrophils Relative %: 43 %
Platelets: 220 10*3/uL (ref 150–400)
RBC: 4.22 MIL/uL (ref 4.22–5.81)
RDW: 12.5 % (ref 11.5–15.5)
WBC: 3.3 10*3/uL — ABNORMAL LOW (ref 4.0–10.5)
nRBC: 0 % (ref 0.0–0.2)

## 2022-09-04 LAB — TROPONIN I (HIGH SENSITIVITY): Troponin I (High Sensitivity): 12 ng/L (ref <18)

## 2022-09-04 NOTE — Telephone Encounter (Signed)
Discussed with pt, understanding voiced. 

## 2022-09-04 NOTE — Discharge Instructions (Addendum)
Your evaluation today did not show any serious problems.  Your blood pressure did come down with simple observation.  Please check your blood pressure 4 times a day and keep a record of it.  Take that record with you when you see your primary care provider.  Once your blood pressure regimen is settled, you should only need to check your blood pressure once, or at most, twice a day.  You will need to work with your endocrinologist regarding your thyroid medication.  Some of your symptoms may still be related to an overactive thyroid.

## 2022-09-04 NOTE — Telephone Encounter (Signed)
Pt called stating he was seen in the ER last night. He's experiencing weakness, fatigue, weight loss, nausea, night sweats, hot flashes, difficulty sleeping, numbness in fingers and toes, elevated BP, no appetite and aching. States the ER lab work has been WNL and they suggest his symptoms are from his thyroid. Pt states Dr.Fusco had labs performed that the pt would like you to look over. I have laid those on your desk. Pt has an appointment on March 25 at 2:30.

## 2022-09-05 ENCOUNTER — Ambulatory Visit: Payer: Medicare Other | Admitting: Family Medicine

## 2022-09-08 ENCOUNTER — Emergency Department (HOSPITAL_COMMUNITY)
Admission: EM | Admit: 2022-09-08 | Discharge: 2022-09-09 | Disposition: A | Discharge status: Home / Self Care | Payer: 59 | Source: Home / Self Care | Attending: Emergency Medicine | Admitting: Emergency Medicine

## 2022-09-08 ENCOUNTER — Other Ambulatory Visit: Payer: Self-pay

## 2022-09-08 ENCOUNTER — Other Ambulatory Visit: Payer: Self-pay | Admitting: "Endocrinology

## 2022-09-08 ENCOUNTER — Encounter (HOSPITAL_COMMUNITY): Payer: Self-pay | Admitting: Emergency Medicine

## 2022-09-08 ENCOUNTER — Encounter (HOSPITAL_COMMUNITY): Payer: Self-pay

## 2022-09-08 ENCOUNTER — Emergency Department (HOSPITAL_COMMUNITY)
Admission: EM | Admit: 2022-09-08 | Discharge: 2022-09-08 | Disposition: A | Discharge status: Home / Self Care | Payer: 59 | Attending: Emergency Medicine | Admitting: Emergency Medicine

## 2022-09-08 ENCOUNTER — Telehealth: Payer: Self-pay | Admitting: Cardiovascular Disease

## 2022-09-08 ENCOUNTER — Telehealth: Payer: Self-pay

## 2022-09-08 ENCOUNTER — Emergency Department (HOSPITAL_COMMUNITY): Payer: 59

## 2022-09-08 DIAGNOSIS — Z743 Need for continuous supervision: Secondary | ICD-10-CM | POA: Diagnosis not present

## 2022-09-08 DIAGNOSIS — J90 Pleural effusion, not elsewhere classified: Secondary | ICD-10-CM | POA: Diagnosis not present

## 2022-09-08 DIAGNOSIS — R531 Weakness: Secondary | ICD-10-CM

## 2022-09-08 DIAGNOSIS — E0590 Thyrotoxicosis, unspecified without thyrotoxic crisis or storm: Secondary | ICD-10-CM

## 2022-09-08 DIAGNOSIS — I4892 Unspecified atrial flutter: Secondary | ICD-10-CM

## 2022-09-08 DIAGNOSIS — J9811 Atelectasis: Secondary | ICD-10-CM | POA: Diagnosis not present

## 2022-09-08 DIAGNOSIS — R109 Unspecified abdominal pain: Secondary | ICD-10-CM | POA: Diagnosis not present

## 2022-09-08 DIAGNOSIS — Z87891 Personal history of nicotine dependence: Secondary | ICD-10-CM | POA: Insufficient documentation

## 2022-09-08 DIAGNOSIS — Z20822 Contact with and (suspected) exposure to covid-19: Secondary | ICD-10-CM | POA: Insufficient documentation

## 2022-09-08 DIAGNOSIS — R7989 Other specified abnormal findings of blood chemistry: Secondary | ICD-10-CM | POA: Insufficient documentation

## 2022-09-08 DIAGNOSIS — I10 Essential (primary) hypertension: Secondary | ICD-10-CM | POA: Insufficient documentation

## 2022-09-08 DIAGNOSIS — Z91040 Latex allergy status: Secondary | ICD-10-CM | POA: Diagnosis not present

## 2022-09-08 DIAGNOSIS — Z8546 Personal history of malignant neoplasm of prostate: Secondary | ICD-10-CM | POA: Insufficient documentation

## 2022-09-08 DIAGNOSIS — I499 Cardiac arrhythmia, unspecified: Secondary | ICD-10-CM | POA: Diagnosis not present

## 2022-09-08 DIAGNOSIS — R002 Palpitations: Secondary | ICD-10-CM | POA: Diagnosis not present

## 2022-09-08 DIAGNOSIS — Z7901 Long term (current) use of anticoagulants: Secondary | ICD-10-CM | POA: Insufficient documentation

## 2022-09-08 DIAGNOSIS — R0789 Other chest pain: Secondary | ICD-10-CM

## 2022-09-08 DIAGNOSIS — R000 Tachycardia, unspecified: Secondary | ICD-10-CM | POA: Diagnosis not present

## 2022-09-08 LAB — HEPATIC FUNCTION PANEL
ALT: 21 U/L (ref 0–44)
AST: 24 U/L (ref 15–41)
Albumin: 3.9 g/dL (ref 3.5–5.0)
Alkaline Phosphatase: 41 U/L (ref 38–126)
Bilirubin, Direct: 0.2 mg/dL (ref 0.0–0.2)
Indirect Bilirubin: 0.5 mg/dL (ref 0.3–0.9)
Total Bilirubin: 0.7 mg/dL (ref 0.3–1.2)
Total Protein: 7.1 g/dL (ref 6.5–8.1)

## 2022-09-08 LAB — URINALYSIS, ROUTINE W REFLEX MICROSCOPIC
Bilirubin Urine: NEGATIVE
Glucose, UA: NEGATIVE mg/dL
Hgb urine dipstick: NEGATIVE
Ketones, ur: NEGATIVE mg/dL
Leukocytes,Ua: NEGATIVE
Nitrite: NEGATIVE
Protein, ur: NEGATIVE mg/dL
Specific Gravity, Urine: 1.009 (ref 1.005–1.030)
pH: 9 — ABNORMAL HIGH (ref 5.0–8.0)

## 2022-09-08 LAB — CBG MONITORING, ED: Glucose-Capillary: 121 mg/dL — ABNORMAL HIGH (ref 70–99)

## 2022-09-08 LAB — BASIC METABOLIC PANEL
Anion gap: 9 (ref 5–15)
BUN: 5 mg/dL — ABNORMAL LOW (ref 8–23)
CO2: 29 mmol/L (ref 22–32)
Calcium: 10 mg/dL (ref 8.9–10.3)
Chloride: 101 mmol/L (ref 98–111)
Creatinine, Ser: 0.9 mg/dL (ref 0.61–1.24)
GFR, Estimated: >60 mL/min (ref >60)
Glucose, Bld: 115 mg/dL — ABNORMAL HIGH (ref 70–99)
Potassium: 3.8 mmol/L (ref 3.5–5.1)
Sodium: 139 mmol/L (ref 135–145)

## 2022-09-08 LAB — CBC
HCT: 41.9 % (ref 39.0–52.0)
Hemoglobin: 13.7 g/dL (ref 13.0–17.0)
MCH: 29.3 pg (ref 26.0–34.0)
MCHC: 32.7 g/dL (ref 30.0–36.0)
MCV: 89.5 fL (ref 80.0–100.0)
Platelets: 249 10*3/uL (ref 150–400)
RBC: 4.68 MIL/uL (ref 4.22–5.81)
RDW: 12.1 % (ref 11.5–15.5)
WBC: 3.2 10*3/uL — ABNORMAL LOW (ref 4.0–10.5)
nRBC: 0 % (ref 0.0–0.2)

## 2022-09-08 LAB — RESP PANEL BY RT-PCR (RSV, FLU A&B, COVID)  RVPGX2
Influenza A by PCR: NEGATIVE
Influenza B by PCR: NEGATIVE
Resp Syncytial Virus by PCR: NEGATIVE
SARS Coronavirus 2 by RT PCR: NEGATIVE

## 2022-09-08 LAB — TROPONIN I (HIGH SENSITIVITY)
Troponin I (High Sensitivity): 13 ng/L (ref <18)
Troponin I (High Sensitivity): 13 ng/L (ref <18)
Troponin I (High Sensitivity): 12 ng/L (ref <18)

## 2022-09-08 MED ORDER — METOPROLOL TARTRATE 25 MG PO TABS: 25.0000 mg | ORAL_TABLET | Freq: Two times a day (BID) | ORAL | 3 refills | Status: DC

## 2022-09-08 MED ORDER — SODIUM CHLORIDE 0.9 % IV BOLUS
1000.0000 mL | Freq: Once | INTRAVENOUS | Status: AC
  Administered 2022-09-08: 1000 mL via INTRAVENOUS

## 2022-09-08 NOTE — Telephone Encounter (Signed)
Pt has called back stating he continues experience aching, unable to sleep, no appetite, losing weight, weak. Pt has an appointment for 3-25 and is on our cancellation list to call if we have any appointments open up sooner. Will you call pt please?

## 2022-09-08 NOTE — ED Triage Notes (Signed)
Complaining of feeling generally weak over the last week, complaining of having heart palpitations as well, said he has been here for the same in the past. Is able to ambulate in triage without assistance.

## 2022-09-08 NOTE — Discharge Instructions (Signed)
Follow-up with your cardiologist this week.  Call them Monday to get an appointment this week for recheck

## 2022-09-08 NOTE — ED Provider Notes (Signed)
[facility] Provider Note   CSN: [id number] Arrival date & time: 09/08/22  1335     History {Add pertinent medical, surgical, social history, OB history to HPI:1} Chief Complaint  Patient presents with   Weakness    Steven [name] is a 72 y.o. male.  Patient complains of weakness.  Patient has a history of atrial flutter.  He has been having weakness for a number of months   Weakness      Home Medications Prior to Admission medications   Medication Sig Start Date End Date Taking? Authorizing Provider  amLODipine (NORVASC) 10 MG tablet Take 10 mg by mouth daily. 08/24/22  Yes [provider]  metoprolol tartrate (LOPRESSOR) 25 MG tablet Take 1 tablet (25 mg total) by mouth 2 (two) times daily. 09/08/22 09/03/23  Josue Hector, MD  acetaminophen (TYLENOL) 500 MG tablet Take 500 mg by mouth every 6 (six) hours as needed for moderate pain.    [provider]  alfuzosin (UROXATRAL) 10 MG 24 hr tablet Take 1 tablet (10 mg total) by mouth daily with breakfast. 04/07/22   [name], Candee Furbish, MD  Cholecalciferol (VITAMIN D3) 125 MCG (5000 UT) CAPS Take 1 capsule by mouth daily.     [provider]  ELIQUIS 5 MG TABS tablet TAKE 1 TABLET(5 MG) BY MOUTH TWICE DAILY Patient taking differently: Take 5 mg by mouth 2 (two) times daily. 04/24/22   Josue Hector, MD  Erenumab-aooe (AIMOVIG) 70 MG/ML SOAJ Inject 1 mL into the skin every 30 (thirty) days.    [provider]  gabapentin (NEURONTIN) 300 MG capsule Take 300 mg by mouth 3 (three) times daily as needed. 02/24/20   [provider]  hydrochlorothiazide (HYDRODIURIL) 25 MG tablet Take 25 mg by mouth daily. 07/28/19   [provider]  losartan (COZAAR) 50 MG tablet Take 1.5 tablets (75 mg total) by mouth daily. 08/23/22   Barton Dubois, MD  methimazole (TAPAZOLE) 10 MG tablet Take 1 tablet (10 mg total) by mouth 2 (two) times daily. 08/23/22    Barton Dubois, MD  NEXLETOL 180 MG TABS Take 180 mg by mouth daily. Pt taking 2-3 times a week 12/03/19   [provider]  ondansetron (ZOFRAN-ODT) 4 MG disintegrating tablet Take 1 tablet (4 mg total) by mouth every 8 (eight) hours as needed for nausea or vomiting. 08/23/22   Barton Dubois, MD  pantoprazole (PROTONIX) 40 MG tablet Take 1 tablet (40 mg total) by mouth daily. 08/23/22   Barton Dubois, MD  polyethylene glycol (MIRALAX / GLYCOLAX) 17 g packet Take 17 g by mouth daily. 08/23/22   Barton Dubois, MD  potassium chloride SA (KLOR-CON M) 20 MEQ tablet Take 1 tablet (20 mEq total) by mouth 2 (two) times daily. 08/23/22   Barton Dubois, MD  Probiotic Product (PROBIOTIC-10 PO) Take 1 capsule by mouth daily.    [provider]  vitamin B-12 (CYANOCOBALAMIN) 100 MCG tablet Take 100 mcg by mouth daily.    [provider]      Allergies    Latex and Meclizine    Review of Systems   Review of Systems  Neurological:  Positive for weakness.    Physical Exam Updated Vital Signs BP (!) 158/77   Pulse (!) 55   Temp 98 F (36.7 C) (Oral)   Resp 18   Ht 5' 7"$  (1.702 m)   Wt 79.4 kg   SpO2 99%   BMI  27.41 kg/m  Physical Exam  ED Results / Procedures / Treatments   Labs (all labs ordered are listed, but only abnormal results are displayed) Labs Reviewed  BASIC METABOLIC PANEL - Abnormal; Notable for the following components:      Result Value   Glucose, Bld 115 (*)    BUN 5 (*)    All other components within normal limits  CBC - Abnormal; Notable for the following components:   WBC 3.2 (*)    All other components within normal limits  URINALYSIS, ROUTINE W REFLEX MICROSCOPIC - Abnormal; Notable for the following components:   pH 9.0 (*)    All other components within normal limits  CBG MONITORING, ED - Abnormal; Notable for the following components:   Glucose-Capillary 121 (*)    All other components within normal limits  RESP PANEL BY RT-PCR (RSV,  FLU A&B, COVID)  [id number]  HEPATIC FUNCTION PANEL  TROPONIN I (HIGH SENSITIVITY)  TROPONIN I (HIGH SENSITIVITY)  TROPONIN I (HIGH SENSITIVITY)  TROPONIN I (HIGH SENSITIVITY)    EKG None  Radiology DG ABD ACUTE 2+V W 1V CHEST  Result Date: 09/08/2022 CLINICAL DATA:  Pain. General weakness over the last week with heart palpitations. EXAM: DG ABDOMEN ACUTE WITH 1 VIEW CHEST COMPARISON:  Chest radiograph 08/29/2022. FINDINGS: There is no evidence of dilated bowel loops or free intraperitoneal air. No radiopaque calculi or other significant radiographic abnormality is seen. Surgical clips in the pelvis. Small left pleural effusion with linear opacity of the left lung base, likely atelectasis. Normal heart size and mediastinal contours. No pneumothorax. IMPRESSION: 1. Small left pleural effusion with left basilar atelectasis. 2. Negative abdominal radiographs. Electronically Signed   By: Emmit Alexanders M.D.   On: 09/08/2022 16:17    Procedures Procedures  {Document cardiac monitor, telemetry assessment procedure when appropriate:1}  Medications Ordered in ED Medications  sodium chloride 0.9 % bolus 1,000 mL (0 mLs Intravenous Stopped 09/08/22 1610)    ED Course/ Medical Decision Making/ A&P   {   Click here for ABCD2, HEART and other calculatorsREFRESH Note before signing :1}                          Medical Decision Making Amount and/or Complexity of Data Reviewed Labs: ordered. Radiology: ordered. ECG/medicine tests: ordered.   Patient with chronic weakness.  Patient had nonspecific changes on EKG with normal troponins.  Other labs are unremarkable.  He is referred back to cardiology for consideration of further cardiac testing  {Document critical care time when appropriate:1} {Document review of labs and clinical decision tools ie heart score, Chads2Vasc2 etc:1}  {Document your independent review of radiology images, and any outside records:1} {Document your discussion with  family members, caretakers, and with consultants:1} {Document social determinants of health affecting pt's care:1} {Document your decision making why or why not admission, treatments were needed:1} Final Clinical Impression(s) / ED Diagnoses Final diagnoses:  Weakness  Chest tightness    Rx / DC Orders ED Discharge Orders          Ordered    Ambulatory referral to Cardiology       Comments: If you have not heard from the Cardiology office within the next 72 hours please call [phone].   09/08/22 2149

## 2022-09-08 NOTE — Telephone Encounter (Signed)
Patient notified and verbalized understanding. 

## 2022-09-08 NOTE — ED Triage Notes (Signed)
Pt to ed from home via [facility]. Pt just d/c from [facility]. Pt c/o afib and weakness. Supposed to wear a heart monitor and has not worn it today.

## 2022-09-08 NOTE — Telephone Encounter (Signed)
Pt c/o medication issue:  1. Name of Medication:  metoprolol tartrate (LOPRESSOR) 50 MG tablet  2. How are you currently taking this medication (dosage and times per day)?   50 MG twice daily, as prescribed  3. Are you having a reaction (difficulty breathing--STAT)?   4. What is your medication issue?   Patient assumes this dose may be too strong. He states he constantly feels sick and nauseous. He has also been experiencing muscle/joint aches in arms and legs, weakness, low appetite, heart flutters, fatigue, SOB

## 2022-09-08 NOTE — Telephone Encounter (Signed)
Noted  

## 2022-09-09 ENCOUNTER — Emergency Department (HOSPITAL_COMMUNITY): Payer: 59

## 2022-09-09 DIAGNOSIS — J9811 Atelectasis: Secondary | ICD-10-CM | POA: Diagnosis not present

## 2022-09-09 LAB — COMPREHENSIVE METABOLIC PANEL
ALT: 25 U/L (ref 0–44)
AST: 23 U/L (ref 15–41)
Albumin: 4.2 g/dL (ref 3.5–5.0)
Alkaline Phosphatase: 51 U/L (ref 38–126)
Anion gap: 14 (ref 5–15)
BUN: 5 mg/dL — ABNORMAL LOW (ref 8–23)
CO2: 25 mmol/L (ref 22–32)
Calcium: 9.9 mg/dL (ref 8.9–10.3)
Chloride: 100 mmol/L (ref 98–111)
Creatinine, Ser: 0.98 mg/dL (ref 0.61–1.24)
GFR, Estimated: >60 mL/min (ref >60)
Glucose, Bld: 111 mg/dL — ABNORMAL HIGH (ref 70–99)
Potassium: 2.9 mmol/L — ABNORMAL LOW (ref 3.5–5.1)
Sodium: 139 mmol/L (ref 135–145)
Total Bilirubin: 1.2 mg/dL (ref 0.3–1.2)
Total Protein: 7.4 g/dL (ref 6.5–8.1)

## 2022-09-09 LAB — CBC
HCT: 42.5 % (ref 39.0–52.0)
Hemoglobin: 14.5 g/dL (ref 13.0–17.0)
MCH: 29.7 pg (ref 26.0–34.0)
MCHC: 34.1 g/dL (ref 30.0–36.0)
MCV: 87.1 fL (ref 80.0–100.0)
Platelets: 265 10*3/uL (ref 150–400)
RBC: 4.88 MIL/uL (ref 4.22–5.81)
RDW: 12.1 % (ref 11.5–15.5)
WBC: 4.3 10*3/uL (ref 4.0–10.5)
nRBC: 0 % (ref 0.0–0.2)

## 2022-09-09 LAB — TSH: TSH: 0.011 u[IU]/mL — ABNORMAL LOW (ref 0.350–4.500)

## 2022-09-09 LAB — MAGNESIUM: Magnesium: 2 mg/dL (ref 1.7–2.4)

## 2022-09-09 MED ORDER — DILTIAZEM HCL 25 MG/5ML IV SOLN
15.0000 mg | Freq: Once | INTRAVENOUS | Status: AC
  Administered 2022-09-09: 15 mg via INTRAVENOUS
  Filled 2022-09-09: qty 5

## 2022-09-09 MED ORDER — POTASSIUM CHLORIDE CRYS ER 20 MEQ PO TBCR
40.0000 meq | EXTENDED_RELEASE_TABLET | Freq: Once | ORAL | Status: AC
  Administered 2022-09-09: 40 meq via ORAL
  Filled 2022-09-09: qty 2

## 2022-09-09 MED ORDER — SODIUM CHLORIDE 0.9 % IV BOLUS
500.0000 mL | Freq: Once | INTRAVENOUS | Status: AC
  Administered 2022-09-09: 500 mL via INTRAVENOUS

## 2022-09-09 NOTE — Discharge Instructions (Signed)
You were evaluated in the Emergency Department and after careful evaluation, we did not find any emergent condition requiring admission or further testing in the hospital.  Your exam/testing today is overall reassuring.  Important to follow-up with your regular doctors to discuss your symptoms and your thyroid level.  Please return to the Emergency Department if you experience any worsening of your condition.   Thank you for allowing [place] to be a part of your care.

## 2022-09-09 NOTE — ED Notes (Signed)
ED Provider at bedside. 

## 2022-09-09 NOTE — ED Provider Notes (Signed)
[facility] Emergency Department Provider Note MRN:  [id number]  Arrival date & time: 09/09/22     Chief Complaint   Palpitations History of Present Illness   Steven [name] is a 72 y.o. year-old male with a history of A-fib, atrial flutter presenting to the ED with chief complaint of palpitations.  Palpitations that started about 1 hour ago.  Denies chest pain, feels anxious.  Review of Systems  A thorough review of systems was obtained and all systems are negative except as noted in the HPI and PMH.   Patient's Health History    Past Medical History:  Diagnosis Date   Anxiety    Arthritis    Atrial fibrillation (HCC)    Atrial flutter (HCC)    BPH (benign prostatic hyperplasia)    Chest pain    Dizziness    Dyspnea    laying down occ   Fracture 08/17/2015   MULTIPLE RIB FRACTURES     FROM FALL    GERD (gastroesophageal reflux disease)    Hemorrhoids    History of kidney stones    noted on CT scan   History of radiation therapy 08/22/11-10/13/11   prostate   Hyperlipemia    Hypertension    Hyperthyroidism    IBS (irritable bowel syndrome)    Insomnia    Light headedness    Migraine    Numbness and tingling in left arm    Numbness and tingling of both legs    Open fracture of left elbow 08/18/2015   Prostate cancer [facility])    prostate s/p radiation Mar 2013   Rib fractures 08/17/2015   Tinnitus     Past Surgical History:  Procedure Laterality Date   BOTOX INJECTION N/A 01/14/2020   Procedure: INJECTION OF BOTOX INTO ANAL SPHINCTER;  Surgeon: Ileana Roup, MD;  Location: [facility] ORS;  Service: General;  Laterality: N/A;   COLONOSCOPY N/A 12/19/2012   [id number] 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 Normal appearing colon   COLONOSCOPY WITH PROPOFOL N/A 10/23/2019   Procedure: COLONOSCOPY WITH PROPOFOL;  Surgeon: Daneil Dolin, MD; external and grade 2 internal hemorrhoids,  abnormal rectal blood vessels consistent with radiation proctitis s/p APC therapy, otherwise normal exam.   EVALUATION UNDER ANESTHESIA WITH ANAL FISTULECTOMY N/A 01/14/2020   Procedure: ANORECTAL EXAM UNDER ANESTHESIA;  Surgeon: Ileana Roup, MD;  Location: [facility] ORS;  Service: General;  Laterality: N/A;   HOT HEMOSTASIS  10/23/2019   Procedure: HOT HEMOSTASIS (ARGON PLASMA COAGULATION/BICAP);  Surgeon: Daneil Dolin, MD;  Location: [facility];  Service: Endoscopy;;  apc rectal proctitis     KIDNEY SURGERY  1982   kidney tube collapse repair   RADIOACTIVE SEED IMPLANT     Prostate    Family History  Problem Relation Age of Onset   Aneurysm Mother        aortic   Hypertension Mother    Headache Mother    Prostate cancer Father    Hypertension Father    Colon cancer Neg Hx    Colon polyps Neg Hx     Social History   Socioeconomic History   Marital status: Single    Spouse name: Not on file   Number of children: 2   Years of education: Not on file   Highest education level: 12th grade  Occupational History   Occupation: Custodian     Employer: [employer]  Tobacco Use  Smoking status: Former    Packs/day: 1.00    Years: 20.00    Total pack years: 20.00    Types: Cigarettes    Quit date: 2020    Years since quitting: 4.1   Smokeless tobacco: Never   Tobacco comments:    Quit smoking x 2 years    07/2015   SOMETIIMES i USE VAPOR   Vaping Use   Vaping Use: Never used  Substance and Sexual Activity   Alcohol use: Never   Drug use: Never   Sexual activity: Not Currently  Other Topics Concern   Not on file  Social History Narrative   Lives with friend   Divorced   No caffeine   Social Determinants of Health   Financial Resource Strain: Low Risk  (11/03/2020)   Overall Financial Resource Strain (CARDIA)    Difficulty of Paying Living Expenses: Not hard at all  Food Insecurity: No Food Insecurity (11/03/2020)   Hunger Vital Sign    Worried About  Running Out of Food in the Last Year: Never true    Ran Out of Food in the Last Year: Never true  Transportation Needs: No Transportation Needs (11/03/2020)   PRAPARE - Hydrologist (Medical): No    Lack of Transportation (Non-Medical): No  Physical Activity: Inactive (11/03/2020)   Exercise Vital Sign    Days of Exercise per Week: 0 days    Minutes of Exercise per Session: 0 min  Stress: No Stress Concern Present (11/03/2020)   [place]    Feeling of Stress : Not at all  Social Connections: Socially Isolated (11/03/2020)   Social Connection and Isolation Panel [NHANES]    Frequency of Communication with Friends and Family: Twice a week    Frequency of Social Gatherings with Friends and Family: Once a week    Attends Religious Services: Never    Marine scientist or Organizations: No    Attends Archivist Meetings: Never    Marital Status: Divorced  Human resources officer Violence: Not At Risk (11/03/2020)   Humiliation, Afraid, Rape, and Kick questionnaire    Fear of Current or Ex-Partner: No    Emotionally Abused: No    Physically Abused: No    Sexually Abused: No     Physical Exam   Vitals:   09/09/22 0200 09/09/22 0230  BP: 119/76 138/71  Pulse: 75 70  Resp: 18 (!) 21  Temp:    SpO2: 97% 99%    CONSTITUTIONAL: Ill-appearing NEURO/PSYCH:  Alert and oriented x 3, no focal deficits EYES:  eyes equal and reactive ENT/NECK:  no LAD, no JVD CARDIO: Tachycardic rate, well-perfused, normal S1 and S2 PULM:  CTAB no wheezing or rhonchi GI/GU:  non-distended, non-tender MSK/SPINE:  No gross deformities, no edema SKIN:  no rash, atraumatic   *Additional and/or pertinent findings included in MDM below  Diagnostic and Interventional Summary    EKG Interpretation  Date/Time:  Friday September 08 2022 23:53:51 EST Ventricular Rate:  149 PR Interval:  208 QRS  Duration: 85 QT Interval:  299 QTC Calculation: 471 R Axis:   6 Text Interpretation: Atrial flutter with 2 to 1 block Borderline prolonged PR interval LAE, consider biatrial enlargement Repolarization abnormality, prob rate related Confirmed by Gerlene Fee [id number]) on 09/09/2022 12:36:08 AM        EKG Interpretation  Date/Time:  Saturday September 09 2022 00:27:21 EST Ventricular Rate:  75 PR Interval:  180 QRS Duration: 88 QT Interval:  360 QTC Calculation: 402 R Axis:   7 Text Interpretation: Sinus rhythm Atrial premature complex Borderline T abnormalities, inferior leads Confirmed by Gerlene Fee [phone]) on 09/09/2022 12:36:45 AM        Labs Reviewed  COMPREHENSIVE METABOLIC PANEL - Abnormal; Notable for the following components:      Result Value   Potassium 2.9 (*)    Glucose, Bld 111 (*)    BUN 5 (*)    All other components within normal limits  TSH - Abnormal; Notable for the following components:   TSH 0.011 (*)    All other components within normal limits  CBC  MAGNESIUM    DG Chest Port 1 View  Final Result      Medications  sodium chloride 0.9 % bolus 500 mL (0 mLs Intravenous Stopped 09/09/22 0106)  diltiazem (CARDIZEM) injection 15 mg (15 mg Intravenous Given 09/09/22 0023)  potassium chloride SA (KLOR-CON M) CR tablet 40 mEq (40 mEq Oral Given 09/09/22 0202)     Procedures  /  Critical Care .Critical Care  Performed by: Maudie Flakes, MD Authorized by: Maudie Flakes, MD   Critical care provider statement:    Critical care time (minutes):  44   Critical care was necessary to treat or prevent imminent or life-threatening deterioration of the following conditions: Atrial flutter with rapid ventricular response.   Critical care was time spent personally by me on the following activities:  Development of treatment plan with patient or surrogate, discussions with consultants, evaluation of patient's response to treatment, examination of patient, ordering  and review of laboratory studies, ordering and review of radiographic studies, ordering and performing treatments and interventions, pulse oximetry, re-evaluation of patient's condition and review of old charts   ED Course and Medical Decision Making  Initial Impression and Ddx Patient presenting with palpitations, feels very anxious, generally unwell, weak, seen in the emergency department yesterday afternoon for weakness.  On my examination his heart rate is 147 and is consistently 147 for my entire initial evaluation.  EKG suspicious for 2-1 flutter, I see no obvious P waves and given the steady nature of the heart rate atrial flutter with rapid ventricular response is favored.  Providing small fluid bolus, diltiazem dose, will reassess.  Past medical/surgical history that increases complexity of ED encounter: A-fib, a flutter, anticoagulated  Interpretation of Diagnostics I personally reviewed the EKG and my interpretation is as follows: 2-1 atrial flutter  Labs reveal hypokalemia, and low TSH.  Patient Reassessment and Ultimate Disposition/Management     Patient converted to sinus rhythm with a normal rate and maintained this for a few hours here in the emergency department.  Symptoms resolved.  Still worried about his occasional aches and pains and generalized weakness but no signs of emergent process.  Does have a downtrending TSH and he says his doctor is working on that, he is taking his methimazole.  Appropriate for discharge, has follow-up this week.  Patient management required discussion with the following services or consulting groups:  None  Complexity of Problems Addressed Acute illness or injury that poses threat of life of bodily function  Additional Data Reviewed and Analyzed Further history obtained from: Further history from spouse/family member  Additional Factors Impacting ED Encounter Risk Consideration of hospitalization  Barth Kirks. [place] Small, [place] [email or web address]  Final Clinical Impressions(s) / ED Diagnoses     ICD-10-CM   1. Atrial  flutter with rapid ventricular response (HCC)  I48.92     2. Low TSH level  R79.89       ED Discharge Orders     None        Discharge Instructions Discussed with and Provided to Patient:     Discharge Instructions      You were evaluated in the Emergency Department and after careful evaluation, we did not find any emergent condition requiring admission or further testing in the hospital.  Your exam/testing today is overall reassuring.  Important to follow-up with your regular doctors to discuss your symptoms and your thyroid level.  Please return to the Emergency Department if you experience any worsening of your condition.   Thank you for allowing [place] to be a part of your care.       Maudie Flakes, MD 09/09/22 [phone]

## 2022-09-09 NOTE — ED Notes (Signed)
Pt states that "I feel like my heart is skipping a beat"

## 2022-09-09 NOTE — ED Notes (Signed)
Pt responded to diltiazem well. Pt still anxious and tachypnic.

## 2022-09-11 ENCOUNTER — Telehealth: Payer: Self-pay | Admitting: Cardiovascular Disease

## 2022-09-11 DIAGNOSIS — E0590 Thyrotoxicosis, unspecified without thyrotoxic crisis or storm: Secondary | ICD-10-CM | POA: Diagnosis not present

## 2022-09-11 NOTE — Telephone Encounter (Signed)
Discharge Instructions  09/08/22 from [facility]       You were evaluated in the Emergency Department and after careful evaluation, we did not find any emergent condition requiring admission or further testing in the hospital.   Your exam/testing today is overall reassuring.  Important to follow-up with your regular doctors to discuss your symptoms and your thyroid level.   Please return to the Emergency Department if you experience any worsening of your condition.   Thank you for allowing [place] to be a part of your care.       There is no request noted for patient to have stress test. I will forward to [place] for review.

## 2022-09-11 NOTE — Telephone Encounter (Signed)
Pt is requesting call back to discuss his recent ED visits. He was told to schedule stress tests as soon as possible via cardiologist and would like a call back to see about getting orders for that.

## 2022-09-12 DIAGNOSIS — E876 Hypokalemia: Secondary | ICD-10-CM | POA: Diagnosis not present

## 2022-09-12 DIAGNOSIS — K529 Noninfective gastroenteritis and colitis, unspecified: Secondary | ICD-10-CM | POA: Diagnosis not present

## 2022-09-12 DIAGNOSIS — I700 Atherosclerosis of aorta: Secondary | ICD-10-CM | POA: Diagnosis not present

## 2022-09-12 DIAGNOSIS — R232 Flushing: Secondary | ICD-10-CM | POA: Diagnosis not present

## 2022-09-12 LAB — TSH: TSH: 0.015 u[IU]/mL — ABNORMAL LOW (ref 0.450–4.500)

## 2022-09-12 LAB — T3, FREE: T3, Free: 3.7 pg/mL (ref 2.0–4.4)

## 2022-09-12 LAB — T4, FREE: Free T4: 1.77 ng/dL (ref 0.82–1.77)

## 2022-09-12 NOTE — Telephone Encounter (Signed)
Pt c/o of Chest Pain: STAT if CP now or developed within 24 hours  1. Are you having CP right now? Not at this moment, but has bene having them within the last 24 hrs. States today hasn't been bad.   2. Are you experiencing any other symptoms (ex. SOB, nausea, vomiting, sweating)? Pains are all over, no particular side. He states it feels like a stinging pain. Sometimes lt arm will have like a nagging pain, he states the feeling goes to his finger tips, goes to his shoulder blades and to lt side of heart. He states he gets very tired, has low energy, but states that his thyroid is overactive. Moving around makes him nauseated and fatigues Left hand and left foot will start feeling numb.   3. How long have you been experiencing CP? Since Friday  4. Is your CP continuous or coming and going? The pains are off and on.   5. Have you taken Nitroglycerin? No ?

## 2022-09-12 NOTE — Telephone Encounter (Signed)
I offered this Thursday 09/14/22 at 2:30 pm apt with L.Ingold,NP. He said he would "try" to come in.

## 2022-09-13 ENCOUNTER — Other Ambulatory Visit: Payer: Self-pay

## 2022-09-13 ENCOUNTER — Emergency Department (HOSPITAL_COMMUNITY)
Admission: EM | Admit: 2022-09-13 | Discharge: 2022-09-13 | Disposition: A | Discharge status: Home / Self Care | Payer: 59 | Attending: Emergency Medicine | Admitting: Emergency Medicine

## 2022-09-13 ENCOUNTER — Emergency Department (HOSPITAL_COMMUNITY): Payer: 59

## 2022-09-13 ENCOUNTER — Telehealth: Payer: Self-pay

## 2022-09-13 ENCOUNTER — Encounter (HOSPITAL_COMMUNITY): Payer: Self-pay | Admitting: *Deleted

## 2022-09-13 DIAGNOSIS — R197 Diarrhea, unspecified: Secondary | ICD-10-CM | POA: Diagnosis not present

## 2022-09-13 DIAGNOSIS — I10 Essential (primary) hypertension: Secondary | ICD-10-CM | POA: Diagnosis not present

## 2022-09-13 DIAGNOSIS — K8020 Calculus of gallbladder without cholecystitis without obstruction: Secondary | ICD-10-CM | POA: Insufficient documentation

## 2022-09-13 DIAGNOSIS — K760 Fatty (change of) liver, not elsewhere classified: Secondary | ICD-10-CM | POA: Diagnosis not present

## 2022-09-13 DIAGNOSIS — N200 Calculus of kidney: Secondary | ICD-10-CM | POA: Diagnosis not present

## 2022-09-13 LAB — URINALYSIS, ROUTINE W REFLEX MICROSCOPIC
Bilirubin Urine: NEGATIVE
Glucose, UA: NEGATIVE mg/dL
Hgb urine dipstick: NEGATIVE
Ketones, ur: NEGATIVE mg/dL
Leukocytes,Ua: NEGATIVE
Nitrite: NEGATIVE
Protein, ur: NEGATIVE mg/dL
Specific Gravity, Urine: 1.011 (ref 1.005–1.030)
pH: 7 (ref 5.0–8.0)

## 2022-09-13 LAB — COMPREHENSIVE METABOLIC PANEL
ALT: 24 U/L (ref 0–44)
AST: 23 U/L (ref 15–41)
Albumin: 4 g/dL (ref 3.5–5.0)
Alkaline Phosphatase: 48 U/L (ref 38–126)
Anion gap: 7 (ref 5–15)
BUN: <5 mg/dL — ABNORMAL LOW (ref 8–23)
CO2: 27 mmol/L (ref 22–32)
Calcium: 9.9 mg/dL (ref 8.9–10.3)
Chloride: 101 mmol/L (ref 98–111)
Creatinine, Ser: 0.8 mg/dL (ref 0.61–1.24)
GFR, Estimated: >60 mL/min (ref >60)
Glucose, Bld: 116 mg/dL — ABNORMAL HIGH (ref 70–99)
Potassium: 3.7 mmol/L (ref 3.5–5.1)
Sodium: 135 mmol/L (ref 135–145)
Total Bilirubin: 0.8 mg/dL (ref 0.3–1.2)
Total Protein: 6.9 g/dL (ref 6.5–8.1)

## 2022-09-13 LAB — LIPASE, BLOOD: Lipase: 27 U/L (ref 11–51)

## 2022-09-13 LAB — CBC
HCT: 40.4 % (ref 39.0–52.0)
Hemoglobin: 13.6 g/dL (ref 13.0–17.0)
MCH: 29.8 pg (ref 26.0–34.0)
MCHC: 33.7 g/dL (ref 30.0–36.0)
MCV: 88.6 fL (ref 80.0–100.0)
Platelets: 248 10*3/uL (ref 150–400)
RBC: 4.56 MIL/uL (ref 4.22–5.81)
RDW: 12.5 % (ref 11.5–15.5)
WBC: 3.3 10*3/uL — ABNORMAL LOW (ref 4.0–10.5)
nRBC: 0 % (ref 0.0–0.2)

## 2022-09-13 MED ORDER — FENTANYL CITRATE PF 50 MCG/ML IJ SOSY
12.5000 ug | PREFILLED_SYRINGE | Freq: Once | INTRAMUSCULAR | Status: AC
  Administered 2022-09-13: 12.5 ug via INTRAVENOUS
  Filled 2022-09-13: qty 1

## 2022-09-13 MED ORDER — IOHEXOL 300 MG/ML  SOLN
100.0000 mL | Freq: Once | INTRAMUSCULAR | Status: AC | PRN
  Administered 2022-09-13: 100 mL via INTRAVENOUS

## 2022-09-13 NOTE — ED Triage Notes (Signed)
Pt with c/o diarrhea that started this morning, decrease appetite for a month. C/o feeling hot. Pt c/o left arm pain that radiates to chest for a month or so per pt.

## 2022-09-13 NOTE — Telephone Encounter (Signed)
Discussed with pt, he voiced understanding. Stated he was in the ER at the time of call due to experiencing diarrhea, feeling faint and heart racing throughout the day.

## 2022-09-13 NOTE — Discharge Instructions (Signed)
Use your rectal cream as directed.  Please call Dr. Roseanne Kaufman office to arrange follow-up appointment.

## 2022-09-13 NOTE — Telephone Encounter (Signed)
Pt called asking if you had received his lab results and if there is any changes in medication that is needed.

## 2022-09-13 NOTE — Progress Notes (Addendum)
Cardiology Office Note   Date:  09/14/2022   ID:  [name], [place] [name] [birth year], MRN [id number]  PCP:  Redmond School, MD  Cardiologist:  Dr. Johnsie Cancel    Chief Complaint  Patient presents with   Chest Pain      History of Present Illness: [place] [name] is a 72 y.o. male who presents for chest pain and PAF.   past medical history of paroxysmal atrial fibrillation/flutter, HTN, and HLD F/U Cardiac monitor done 04/09/19 for dizziness showed no long pauses and no more PAF. Myovue done 06/25/19 with no Ischemia EF 62% Echo 03/02/19 EF 60-65% no valve disease Lopressor was decreased with no change in dizziness   He sees Dr Merlene Laughter for headaches and was seen in ER for this 06/04/20 Takes Topamax and Gabapentin CT head negative for bleed or other pathology    08/13/22 Seen in ED for headache, chest pain, dyspnea and abdominal pain was in NSR Noted hyperthyroidism and started on methimazole a week prior TSH has been suppressed since 08/01/22 and normal a year ago Troponin negative ECG non acute KUB non acute CXR atelectasis      08/25/22 he was seen by Dr Johnsie Cancel and stress test ordered with plan to avoid cardiac CTA with iodinated contrast with hyperactive thyroid.   Stress test has not been done pt seen in ER 09/09/22 with palpitations.  He was in A flutter with RVR  he was given IV fluids and converted to SR  Hs troponin 12 and 13  CBC was normal , K+ 2.9   Cr was 0.98 Mg+ 2.0  TSH 0.015 free T4 1.77  free T3 3.7  Follow up K+ 3.7  Today he tells me his IBS is acting up, followed by GI.    He has rapid HR at times.  He is wearing a monitor and today last day.   He has 2 types of chest pain one with rapid HR most likely atrial fib induced.  The other type was sharp pains that really were painful. Last 30-60 min   We discussed Dr. Johnsie Cancel had ordered stress test and he needed to have it done.  I described it to him.  He is agreeable.   He also complains of being tired all the time.   He complains  of constipation as well, he has metamucil but does not always take it.    He asked [place] to help him give himself his Aimovig - his daughter is out of town,  his neurologist has retired and he is to go [name]     Past Medical History:  Diagnosis Date   Anxiety    Arthritis    Atrial fibrillation (HCC)    Atrial flutter (HCC)    BPH (benign prostatic hyperplasia)    Chest pain    Dizziness    Dyspnea    laying down occ   Fracture 08/17/2015   MULTIPLE RIB FRACTURES     FROM FALL    GERD (gastroesophageal reflux disease)    Hemorrhoids    History of kidney stones    noted on CT scan   History of radiation therapy 08/22/11-10/13/11   prostate   Hyperlipemia    Hypertension    Hyperthyroidism    IBS (irritable bowel syndrome)    Insomnia    Light headedness    Migraine    Numbness and tingling in left arm    Numbness and tingling of both legs  Open fracture of left elbow 08/18/2015   Prostate cancer [facility])    prostate s/p radiation Mar 2013   Rib fractures 08/17/2015   Tinnitus     Past Surgical History:  Procedure Laterality Date   BOTOX INJECTION N/A 01/14/2020   Procedure: INJECTION OF BOTOX INTO ANAL SPHINCTER;  Surgeon: Ileana Roup, MD;  Location: [facility] ORS;  Service: General;  Laterality: N/A;   COLONOSCOPY N/A 12/19/2012   [id number] 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 Normal appearing colon   COLONOSCOPY WITH PROPOFOL N/A 10/23/2019   Procedure: COLONOSCOPY WITH PROPOFOL;  Surgeon: Daneil Dolin, MD; external and grade 2 internal hemorrhoids, abnormal rectal blood vessels consistent with radiation proctitis s/p APC therapy, otherwise normal exam.   EVALUATION UNDER ANESTHESIA WITH ANAL FISTULECTOMY N/A 01/14/2020   Procedure: ANORECTAL EXAM UNDER ANESTHESIA;  Surgeon: Ileana Roup, MD;  Location: [facility] ORS;  Service: General;  Laterality: N/A;   HOT HEMOSTASIS   10/23/2019   Procedure: HOT HEMOSTASIS (ARGON PLASMA COAGULATION/BICAP);  Surgeon: Daneil Dolin, MD;  Location: [facility];  Service: Endoscopy;;  apc rectal proctitis     KIDNEY SURGERY  1982   kidney tube collapse repair   RADIOACTIVE SEED IMPLANT     Prostate     Current Outpatient Medications  Medication Sig Dispense Refill   acetaminophen (TYLENOL) 500 MG tablet Take 500 mg by mouth every 6 (six) hours as needed for moderate pain.     alfuzosin (UROXATRAL) 10 MG 24 hr tablet Take 1 tablet (10 mg total) by mouth daily with breakfast. 90 tablet 3   amLODipine (NORVASC) 10 MG tablet Take 10 mg by mouth daily.     Cholecalciferol (VITAMIN D3) 125 MCG (5000 UT) CAPS Take 1 capsule by mouth daily.      ELIQUIS 5 MG TABS tablet TAKE 1 TABLET(5 MG) BY MOUTH TWICE DAILY (Patient taking differently: Take 5 mg by mouth 2 (two) times daily.) 60 tablet 5   Erenumab-aooe (AIMOVIG) 70 MG/ML SOAJ Inject 1 mL into the skin every 30 (thirty) days.     gabapentin (NEURONTIN) 300 MG capsule Take 300 mg by mouth 3 (three) times daily as needed.     hydrochlorothiazide (HYDRODIURIL) 25 MG tablet Take 25 mg by mouth daily.     losartan (COZAAR) 50 MG tablet Take 1.5 tablets (75 mg total) by mouth daily. 90 tablet 2   methimazole (TAPAZOLE) 10 MG tablet Take 1 tablet (10 mg total) by mouth 2 (two) times daily. 60 tablet 2   metoprolol tartrate (LOPRESSOR) 25 MG tablet Take 1 tablet (25 mg total) by mouth 2 (two) times daily. 180 tablet 3   NEXLETOL 180 MG TABS Take 180 mg by mouth daily. Pt taking 2-3 times a week     ondansetron (ZOFRAN-ODT) 4 MG disintegrating tablet Take 1 tablet (4 mg total) by mouth every 8 (eight) hours as needed for nausea or vomiting. 20 tablet 1   pantoprazole (PROTONIX) 40 MG tablet Take 1 tablet (40 mg total) by mouth daily. 30 tablet 1   polyethylene glycol (MIRALAX / GLYCOLAX) 17 g packet Take 17 g by mouth daily. 30 each 2   potassium chloride SA (KLOR-CON M) 20 MEQ tablet  Take 1 tablet (20 mEq total) by mouth 2 (two) times daily.     Probiotic Product (PROBIOTIC-10 PO) Take 1 capsule by mouth daily.     vitamin B-12 (CYANOCOBALAMIN) 100 MCG tablet Take 100 mcg  by mouth daily.     No current facility-administered medications for this visit.    Allergies:   Latex and Meclizine    Social History:  The patient  reports that he quit smoking about 4 years ago. His smoking use included cigarettes. He has a 20.00 pack-year smoking history. He has never used smokeless tobacco. He reports that he does not drink alcohol and does not use drugs.   Family History:  The patient's family history includes Aneurysm in his mother; Headache in his mother; Hypertension in his father and mother; Prostate cancer in his father.    ROS:  General:no colds or fevers, + weight gain Skin:no rashes or ulcers HEENT:no blurred vision, no congestion CV:see HPI PUL:see HPI GI:occ diarrhea constipation or melena, no indigestion GU:no hematuria, no dysuria MS:no joint pain, no claudication Neuro:no syncope, no lightheadedness, + neuropathy on neurontin  Endo:no diabetes, + thyroid disease  Wt Readings from Last 3 Encounters:  09/14/22 189 lb 9.6 oz (86 kg)  09/08/22 175 lb 0.7 oz (79.4 kg)  09/08/22 175 lb (79.4 kg)     PHYSICAL EXAM: VS:  BP 130/64   Pulse 72   Ht '5\' 7"'$  (1.702 m)   Wt 189 lb 9.6 oz (86 kg)   SpO2 96%   BMI 29.70 kg/m  , BMI Body mass index is 29.7 kg/m. General:Pleasant affect, NAD Skin:Warm and dry, brisk capillary refill HEENT:normocephalic, sclera clear, mucus membranes moist Neck:supple, no JVD, no bruits  Heart:S1S2 RRR without murmur, gallup, rub or click Lungs:occ wheeze without rales, rhonchi.  [id number], non tender, + BS, do not palpate liver spleen or masses Ext:no lower ext edema, 2+ pedal pulses, 2+ radial pulses Neuro:alert and oriented X3 , MAE, follows commands, + facial symmetry    EKG:  EKG is not ordered today. The ekg from ER  yesterday SR and no acute ST changes     Recent Labs: 08/13/2022: B Natriuretic Peptide 8.0 09/09/2022: Magnesium 2.0 09/11/2022: TSH 0.015 09/13/2022: ALT 24; BUN <5; Creatinine, Ser 0.80; Hemoglobin 13.6; Platelets 248; Potassium 3.7; Sodium 135    Lipid Panel    Component Value Date/Time   CHOL 142 04/16/2020 0000   TRIG 74 04/16/2020 0000   HDL 48 04/16/2020 0000   CHOLHDL 4.4 03/02/2019 0329   VLDL 18 03/02/2019 0329   LDLCALC 79 04/16/2020 0000       Other studies Reviewed: Additional studies/ records that were reviewed today include: . Echo 08/22/22   1. Left ventricular ejection fraction, by estimation, is 60 to 65%. The  left ventricle has normal function. The left ventricle has no regional  wall motion abnormalities. Left ventricular diastolic parameters are  consistent with Grade I diastolic  dysfunction (impaired relaxation).   2. Right ventricular systolic function is normal. The right ventricular  size is normal. Tricuspid regurgitation signal is inadequate for assessing  PA pressure.   3. Left atrial size was mildly dilated.   4. The mitral valve is normal in structure. No evidence of mitral valve  regurgitation. No evidence of mitral stenosis.   5. The aortic valve is tricuspid. There is moderate calcification of the  aortic valve. Aortic valve regurgitation is not visualized. No aortic  stenosis is present.   6. The inferior vena cava is normal in size with greater than 50%  respiratory variability, suggesting right atrial pressure of 3 mmHg.   Comparison(s): No significant change from prior study.   FINDINGS   Left Ventricle: Left ventricular ejection fraction, by  estimation, is 60  to 65%. The left ventricle has normal function. The left ventricle has no  regional wall motion abnormalities. The left ventricular internal cavity  size was normal in size. There is   no left ventricular hypertrophy. Left ventricular diastolic parameters  are consistent  with Grade I diastolic dysfunction (impaired relaxation).   Right Ventricle: The right ventricular size is normal. No increase in  right ventricular wall thickness. Right ventricular systolic function is  normal. Tricuspid regurgitation signal is inadequate for assessing PA  pressure.   Left Atrium: Left atrial size was mildly dilated.   Right Atrium: Right atrial size was normal in size.   Pericardium: There is no evidence of pericardial effusion.   Mitral Valve: The mitral valve is normal in structure. No evidence of  mitral valve regurgitation. No evidence of mitral valve stenosis.   Tricuspid Valve: The tricuspid valve is grossly normal. Tricuspid valve  regurgitation is not demonstrated. No evidence of tricuspid stenosis.   Aortic Valve: The aortic valve is tricuspid. There is moderate  calcification of the aortic valve. Aortic valve regurgitation is not  visualized. No aortic stenosis is present.   Pulmonic Valve: The pulmonic valve was not well visualized. Pulmonic valve  regurgitation is mild. No evidence of pulmonic stenosis.   Aorta: The aortic root is normal in size and structure.   Venous: The inferior vena cava is normal in size with greater than 50%  respiratory variability, suggesting right atrial pressure of 3 mmHg.   IAS/Shunts: No atrial level shunt detected by color flow Doppler.   ASSESSMENT AND PLAN:  1.  Chest pain some with his atrial fib, but some without pounding heart beat. Neg troponin yesterday in ER and labs stable.  Will do lexiscan myoview and he will follow up with Dr. Johnsie Cancel or APP post study.  Wer briefly discussed cardiac cath.  Labs from ER from yesterday reviewed and ER MD note.  Follow up after nuc study.    2. PAF in ER - pt wearing a monitor so will see his a fib burden.  He is on Eliquis and no bleeding.  He has hyperthyroid and on etimazole which could be driving his PAF.  He is on metoprolol tartrate 25 BID  will see what his heart  monitor shows is a fib burden.    3.  HTN controlled continue meds  4.  HLD per PCP now on Nexlitiol.  5.  Hyperthyroidism on methimazole  Scan showed low normal radioactive iodine uptake No focal nodules F/U Dr Dorris Fetch   last TSH 09/11/22 0.015    6.  IBS with diarrhea per GI   ADDENDUM  09/23/22 nuc study was neg for ischemia (09/20/22),  reviewed with Dr. Johnsie Cancel  He has frequent ER visits with some behavioral health issues and anxiety Once his thyroid is normalized would have Arun do cath including vasoactive study for small vessel dx if no epicardial dx is found Don't want to give him iodinated contrast until thyroid normalized   Current medicines are reviewed with the patient today.  The patient Has no concerns regarding medicines.  The following changes have been made:  See above Labs/ tests ordered today include:see above  Disposition:   FU:  see above  Signed, Cecilie Kicks, NP  09/14/2022 4:10 PM    [facility] [place], [place], [place] Phone: [phone]; Fax: [phone]

## 2022-09-14 ENCOUNTER — Ambulatory Visit (INDEPENDENT_AMBULATORY_CARE_PROVIDER_SITE_OTHER): Payer: 59 | Admitting: Cardiology

## 2022-09-14 ENCOUNTER — Encounter: Payer: Self-pay | Admitting: Cardiology

## 2022-09-14 VITALS — BP 130/64 | HR 72 | Ht 67.0 in | Wt 189.6 lb

## 2022-09-14 DIAGNOSIS — E0590 Thyrotoxicosis, unspecified without thyrotoxic crisis or storm: Secondary | ICD-10-CM | POA: Diagnosis not present

## 2022-09-14 DIAGNOSIS — E782 Mixed hyperlipidemia: Secondary | ICD-10-CM | POA: Diagnosis not present

## 2022-09-14 DIAGNOSIS — I480 Paroxysmal atrial fibrillation: Secondary | ICD-10-CM

## 2022-09-14 DIAGNOSIS — K580 Irritable bowel syndrome with diarrhea: Secondary | ICD-10-CM | POA: Diagnosis not present

## 2022-09-14 DIAGNOSIS — I10 Essential (primary) hypertension: Secondary | ICD-10-CM

## 2022-09-14 DIAGNOSIS — R079 Chest pain, unspecified: Secondary | ICD-10-CM

## 2022-09-14 NOTE — Patient Instructions (Signed)
Medication Instructions:  Your physician recommends that you continue on your current medications as directed. Please refer to the Current Medication list given to you today.   Labwork: None  Testing/Procedures: Your physician has requested that you have a lexiscan myoview. For further information please visit [email or web address]. Please follow instruction sheet, as given.   Follow-Up: Follow up with Dr. Johnsie Cancel or APP in 4-6 weeks.   Any Other Special Instructions Will Be Listed Below (If Applicable).     If you need a refill on your cardiac medications before your next appointment, please call your pharmacy.

## 2022-09-15 ENCOUNTER — Telehealth: Payer: Self-pay | Admitting: Cardiovascular Disease

## 2022-09-15 NOTE — Telephone Encounter (Signed)
Patient c/o Palpitations:  High priority if patient c/o lightheadedness, shortness of breath, or chest pain  How long have you had palpitations/irregular HR/ Afib? Are you having the symptoms now?  Patient can feel his heart racing whenever he uses the restroom.  No symptoms currently.  Are you currently experiencing lightheadedness, SOB or CP?   Do you have a history of afib (atrial fibrillation) or irregular heart rhythm?   Have you checked your BP or HR? (document readings if available):   Are you experiencing any other symptoms?  Very tired/fatigued, weak   Patient states although he was seen yesterday he is still having major issues. He feels very fatigued and feels as if he is unable to get his strength up. He states that he can hardly walk, has to take his time and walk extremely slowly. He states whenever he goes to use the restroom he has someone accompany him because he is afraid that he is going to faint. He states when he uses the bathroom his heart starts racing, he holds his hand over his chest and he can feel his heart thumping.

## 2022-09-15 NOTE — Telephone Encounter (Signed)
Patient stated has history of IBS ,he maybe constipated had loose stools yesterday however, as of today he is constipated. While trying to have a BM pt stated he feels like weak , rapid heart rate and feels like he is going to pass out. He did not take any medications to relieve constipation. Has mild shortness of breath while walking and trying to have an BM. His blood pressure reading this am 156/79 and HR 68. Pt stated he did not contact his PCP pertaining to his symptoms. Will forward to APP.

## 2022-09-16 NOTE — ED Provider Notes (Signed)
[facility] Provider Note   CSN: [id number] Arrival date & time: 09/13/22  1134     History  Chief Complaint  Patient presents with   Diarrhea    [place] [name] is a 72 y.o. male.   Diarrhea Associated symptoms: no abdominal pain, no chills, no fever and no vomiting        [place] [name] is a 72 y.o. male who presents to the Emergency Department complaining of diarrhea that began on the morning of arrival.  States this is a recurrent problem for him.  He has had a decreased appetite for one month. Diarrhea described as loose and brown in color, non bloody or black.  He is also experiencing hot flashes and feels like he is not completely evacuating his bowels.  He denies vomiting, fever, flank pain and dysuria.    Home Medications Prior to Admission medications   Medication Sig Start Date End Date Taking? Authorizing Provider  acetaminophen (TYLENOL) 500 MG tablet Take 500 mg by mouth every 6 (six) hours as needed for moderate pain.    [provider]  alfuzosin (UROXATRAL) 10 MG 24 hr tablet Take 1 tablet (10 mg total) by mouth daily with breakfast. 04/07/22   [name], Candee Furbish, MD  amLODipine (NORVASC) 10 MG tablet Take 10 mg by mouth daily. 08/24/22   [provider]  Cholecalciferol (VITAMIN D3) 125 MCG (5000 UT) CAPS Take 1 capsule by mouth daily.     [provider]  ELIQUIS 5 MG TABS tablet TAKE 1 TABLET(5 MG) BY MOUTH TWICE DAILY Patient taking differently: Take 5 mg by mouth 2 (two) times daily. 04/24/22   Josue Hector, MD  Erenumab-aooe (AIMOVIG) 70 MG/ML SOAJ Inject 1 mL into the skin every 30 (thirty) days.    [provider]  gabapentin (NEURONTIN) 300 MG capsule Take 300 mg by mouth 3 (three) times daily as needed. 02/24/20   [provider]  hydrochlorothiazide (HYDRODIURIL) 25 MG tablet Take 25 mg by mouth daily. 07/28/19   [provider]  losartan (COZAAR) 50 MG  tablet Take 1.5 tablets (75 mg total) by mouth daily. 08/23/22   Barton Dubois, MD  methimazole (TAPAZOLE) 10 MG tablet Take 1 tablet (10 mg total) by mouth 2 (two) times daily. 08/23/22   Barton Dubois, MD  metoprolol tartrate (LOPRESSOR) 25 MG tablet Take 1 tablet (25 mg total) by mouth 2 (two) times daily. 09/08/22 09/03/23  Josue Hector, MD  NEXLETOL 180 MG TABS Take 180 mg by mouth daily. Pt taking 2-3 times a week 12/03/19   [provider]  ondansetron (ZOFRAN-ODT) 4 MG disintegrating tablet Take 1 tablet (4 mg total) by mouth every 8 (eight) hours as needed for nausea or vomiting. 08/23/22   Barton Dubois, MD  pantoprazole (PROTONIX) 40 MG tablet Take 1 tablet (40 mg total) by mouth daily. 08/23/22   Barton Dubois, MD  polyethylene glycol (MIRALAX / GLYCOLAX) 17 g packet Take 17 g by mouth daily. 08/23/22   Barton Dubois, MD  potassium chloride SA (KLOR-CON M) 20 MEQ tablet Take 1 tablet (20 mEq total) by mouth 2 (two) times daily. 08/23/22   Barton Dubois, MD  Probiotic Product (PROBIOTIC-10 PO) Take 1 capsule by mouth daily.    [provider]  vitamin B-12 (CYANOCOBALAMIN) 100 MCG tablet Take 100 mcg by mouth daily.    [provider]      Allergies    Latex and  Meclizine    Review of Systems   Review of Systems  Constitutional:  Negative for chills and fever.  Respiratory:  Negative for chest tightness and shortness of breath.   Cardiovascular:  Negative for chest pain.  Gastrointestinal:  Positive for diarrhea. Negative for abdominal pain, blood in stool, nausea and vomiting.  Genitourinary:  Negative for dysuria and flank pain.  Musculoskeletal:  Negative for back pain.  Skin:  Negative for color change and rash.    Physical Exam Updated Vital Signs BP (!) 153/83   Pulse (!) 58   Temp 98.2 F (36.8 C)   Resp 18   SpO2 100%  Physical Exam Vitals and nursing note reviewed.  Constitutional:      General: He is not in acute distress.     Appearance: Normal appearance. He is not ill-appearing.  HENT:     Mouth/Throat:     Mouth: Mucous membranes are moist.  Cardiovascular:     Rate and Rhythm: Normal rate.  Pulmonary:     Effort: Pulmonary effort is normal. No respiratory distress.  Abdominal:     General: There is no distension.     Palpations: Abdomen is soft. There is no mass.     Tenderness: There is abdominal tenderness. There is no right CVA tenderness, left CVA tenderness or guarding.     Comments: Diffuse abdominal tenderness.    Musculoskeletal:        General: Normal range of motion.     Right lower leg: No edema.     Left lower leg: No edema.  Skin:    General: Skin is warm.  Neurological:     General: No focal deficit present.     Mental Status: He is alert.     Sensory: No sensory deficit.     Motor: No weakness.     ED Results / Procedures / Treatments   Labs (all labs ordered are listed, but only abnormal results are displayed) Labs Reviewed  COMPREHENSIVE METABOLIC PANEL - Abnormal; Notable for the following components:      Result Value   Glucose, Bld 116 (*)    BUN <5 (*)    All other components within normal limits  CBC - Abnormal; Notable for the following components:   WBC 3.3 (*)    All other components within normal limits  LIPASE, BLOOD  URINALYSIS, ROUTINE W REFLEX MICROSCOPIC    EKG EKG Interpretation  Date/Time:  Wednesday September 13 2022 12:20:49 EST Ventricular Rate:  72 PR Interval:  132 QRS Duration: 76 QT Interval:  384 QTC Calculation: 420 R Axis:   2 Text Interpretation: Normal sinus rhythm Confirmed by Godfrey Pick ([id number]) on 09/13/2022 6:23:15 PM  Radiology CT ABDOMEN PELVIS W CONTRAST  Result Date: 09/13/2022 CLINICAL DATA:  Acute abdominal pain with diarrhea, initial encounter EXAM: CT ABDOMEN AND PELVIS WITH CONTRAST TECHNIQUE: Multidetector CT imaging of the abdomen and pelvis was performed using the standard protocol following bolus administration of  intravenous contrast. RADIATION DOSE REDUCTION: This exam was performed according to the departmental dose-optimization program which includes automated exposure control, adjustment of the mA and/or kV according to patient size and/or use of iterative reconstruction technique. CONTRAST:  171m OMNIPAQUE IOHEXOL 300 MG/ML  SOLN COMPARISON:  07/15/2022 CT, 09/08/2022 abdominal film FINDINGS: Lower chest: No acute abnormality. Liver demonstrates scattered hypodensities most consistent with small cysts. These are stable from the prior CT. Gallbladder is well distended with multiple dependent gallstones. No biliary ductal dilatation is seen. Hepatobiliary:  No focal liver abnormality is seen. No gallstones, gallbladder wall thickening, or biliary dilatation. Pancreas: Unremarkable. No pancreatic ductal dilatation or surrounding inflammatory changes. Spleen: Spleen is within normal limits. Adrenals/Urinary Tract: Adrenal glands are within normal limits. Kidneys show no obstructive change. Punctate nonobstructing lower pole renal stone is noted. The ureters are within normal limits. The bladder is unremarkable. Stomach/Bowel: The appendix is within normal limits. No obstructive or inflammatory changes of the colon are noted. Small bowel and stomach are within normal limits. Vascular/Lymphatic: Aortic atherosclerosis. No enlarged abdominal or pelvic lymph nodes. Reproductive: Brachytherapy seeds are noted Other: No abdominal wall hernia or abnormality. No abdominopelvic ascites. Musculoskeletal: Degenerative changes of lumbar spine are noted. Scoliosis concave to the left is seen. IMPRESSION: Cholelithiasis without complicating factors. Stable cysts in the liver. Tiny nonobstructing right renal stone. Electronically Signed   By: Inez Catalina M.D.   On: 09/13/2022 18:11    Procedures Procedures    Medications Ordered in ED Medications  fentaNYL (SUBLIMAZE) injection 12.5 mcg (12.5 mcg Intravenous Given 09/13/22 1706)   iohexol (OMNIPAQUE) 300 MG/ML solution 100 mL (100 mLs Intravenous Contrast Given 09/13/22 1805)    ED Course/ Medical Decision Making/ A&P                             Medical Decision Making Pt with diarrhea earlier, reports hot flashes, decreased appetite for one month.  Reports abdominal pain, but no localized pain on my exam.  Diff dx includes SBO, impaction, diverticulitis, neoplasm.   Amount and/or Complexity of Data Reviewed Labs: ordered.    Details: Labs show leukopenia which seems to be baseline.  Remaining labs unremarkable.  Radiology: ordered.    Details: CT abd pelvis. Shows gallstones w/o complication factors or other acute findings ECG/medicine tests: ordered.    Details: EKG shows sinus rhythm.   Discussion of management or test interpretation with external provider(s): Diarrhea possibly from a viral source.  No CT findings to explain pt's sx's.  On review of triage note, pt reported chest pain to triage,  but not mentioned to me.  Doubt emergent process, pt agreeable to close out pt f/u  Risk Prescription drug management.           Final Clinical Impression(s) / ED Diagnoses Final diagnoses:  Diarrhea, unspecified type  Gallstones    Rx / DC Orders ED Discharge Orders     None         Bufford Lope 09/16/22 1956    Godfrey Pick, MD 09/18/22 [phone]

## 2022-09-18 NOTE — Telephone Encounter (Signed)
Patient calling back.  Pt c/o of Chest Pain: STAT if CP now or developed within 24 hours  1. Are you having CP right now? no  2. Are you experiencing any other symptoms (ex. SOB, nausea, vomiting, sweating)? Nausea, "choked up in the throat", hot flashes  3. How long have you been experiencing CP? 3-4 days, ever since he was at the ED  4. Is your CP continuous or coming and going? Comes and goes  5. Have you taken Nitroglycerin? No   Patient states he has been having chest pain for 3-4 days. He also says he has pain in his left arm, shoulder and hand. He says he also has trouble sleeping. He says he gets short black outs then he jolts awake. He says it this has been happening more often. He says has also been fatigued and does not feel like doing anything. He says he gets dizzy when he moves around. He says he has a stress test coming up, but he hopes he is strong enough to do it.  ?

## 2022-09-18 NOTE — Telephone Encounter (Signed)
Spoke with pt who continues to complain of intermittent CP over the last several days.  He denies current CP, SOB or dizziness.  He reports BP this morning 117/78 with HR of 56.  He states he is taking medications as prescribed.  Pt describes CP going from right to left side of chest and sometimes in the center of his chest.  He does report radiation to his arm, shoulder and hand.  Pt does not feel he is going to be strong enough to complete treadmill testing scheduled for 09/22/2022.   Pt advised will forward to Dr Johnsie Cancel and his nurse.  Reviewed ED precautions.  Pt verbalizes understanding and agrees with current plan.

## 2022-09-18 NOTE — Telephone Encounter (Signed)
Patient notified and verbalized understanding. Patient would like stress test sooner than 03/01. Will send message to front staff to see if any sooner available appointments for stress test

## 2022-09-19 ENCOUNTER — Ambulatory Visit (INDEPENDENT_AMBULATORY_CARE_PROVIDER_SITE_OTHER): Payer: 59 | Admitting: Gastroenterology

## 2022-09-20 ENCOUNTER — Encounter (HOSPITAL_COMMUNITY)
Admission: RE | Admit: 2022-09-20 | Discharge: 2022-09-20 | Disposition: A | Discharge status: Home / Self Care | Payer: 59 | Source: Ambulatory Visit | Attending: Cardiology | Admitting: Cardiology

## 2022-09-20 ENCOUNTER — Telehealth: Payer: Self-pay | Admitting: "Endocrinology

## 2022-09-20 ENCOUNTER — Telehealth: Payer: Self-pay

## 2022-09-20 ENCOUNTER — Ambulatory Visit (HOSPITAL_COMMUNITY)
Admission: RE | Admit: 2022-09-20 | Discharge: 2022-09-20 | Disposition: A | Discharge status: Home / Self Care | Payer: 59 | Source: Ambulatory Visit | Attending: Cardiology | Admitting: Cardiology

## 2022-09-20 ENCOUNTER — Encounter (HOSPITAL_COMMUNITY): Payer: Self-pay

## 2022-09-20 DIAGNOSIS — R079 Chest pain, unspecified: Secondary | ICD-10-CM | POA: Insufficient documentation

## 2022-09-20 LAB — NM MYOCAR MULTI W/SPECT W/WALL MOTION / EF
Base ST Depression (mm): 0 mm
LV dias vol: 58 mL (ref 62–150)
LV sys vol: 13 mL
Nuc Stress EF: 78 %
Peak HR: 139 {beats}/min
RATE: 0.3
Rest HR: 77 {beats}/min
Rest Nuclear Isotope Dose: 10 mCi
SDS: 3
SRS: 0
SSS: 3
ST Depression (mm): 0 mm
Stress Nuclear Isotope Dose: 27.8 mCi
TID: 0.94

## 2022-09-20 MED ORDER — REGADENOSON 0.4 MG/5ML IV SOLN
INTRAVENOUS | Status: AC
  Administered 2022-09-20: 0.4 mg via INTRAVENOUS
  Filled 2022-09-20: qty 5

## 2022-09-20 MED ORDER — SODIUM CHLORIDE FLUSH 0.9 % IV SOLN
INTRAVENOUS | Status: AC
  Administered 2022-09-20: 10 mL via INTRAVENOUS
  Filled 2022-09-20: qty 10

## 2022-09-20 MED ORDER — TECHNETIUM TC 99M TETROFOSMIN IV KIT
10.0000 | PACK | Freq: Once | INTRAVENOUS | Status: AC | PRN
  Administered 2022-09-20: 10 via INTRAVENOUS

## 2022-09-20 MED ORDER — TECHNETIUM TC 99M TETROFOSMIN IV KIT
30.0000 | PACK | Freq: Once | INTRAVENOUS | Status: AC | PRN
  Administered 2022-09-20: 27.8 via INTRAVENOUS

## 2022-09-20 NOTE — Telephone Encounter (Signed)
Patient made aware of all.  

## 2022-09-20 NOTE — Telephone Encounter (Signed)
Patient called today states he is still having some issues with his digestive system. He reports that he is staying Nauseated and has no appetite He says he was up all night having bm's. He reports that he feels he has to strain to get the stools out, and then it is a small amount that comes out. He is also having a lot of belching.He was instructed by Cyril Mourning to take Miralax bid, which he says he was not doing. He is taking metamucil daily and taking ondansetron prn. Up coming appointment with Dr. Gala Romney on 10/03/2022.    08/30/2022 Note from [place]:  Increase MiraLAX to 17g twice daily in 8 oz of water to help with constipation. Continue fiber supplement. Abdominal pain likely related to constipation/IBS. Hopefully this will improve with better bowel regularity. For nausea, use Zofran 4 mg every 8 hours as needed. He should already have a prescription for this. He should also be taking pantoprazole 40 mg daily. Keep upcoming visit with Dr. Gala Romney.

## 2022-09-20 NOTE — Telephone Encounter (Signed)
Pt left a VM stating he is still having issues. He has body aches, still sick, nauseous  and can not eat.

## 2022-09-20 NOTE — Telephone Encounter (Signed)
Pt states he is taking his methimazole '10mg'$  bid but continues to experience joint & muscle aching, nausea, no appetite, hot flashes, constipation, weakness and inability to sleep.

## 2022-09-21 ENCOUNTER — Telehealth: Payer: Self-pay

## 2022-09-21 DIAGNOSIS — I4891 Unspecified atrial fibrillation: Secondary | ICD-10-CM | POA: Diagnosis not present

## 2022-09-21 DIAGNOSIS — I10 Essential (primary) hypertension: Secondary | ICD-10-CM | POA: Diagnosis not present

## 2022-09-21 NOTE — Telephone Encounter (Signed)
-----   Message from Josue Hector, MD sent at 09/21/2022  1:03 PM EST ----- NSR one episode of SVT fast and lasted over a minute but most of his symptoms no arrhythmia would have him f/u with EP  ----- Message ----- From: Josue Hector, MD Sent: 09/21/2022  12:51 PM EST To: Josue Hector, MD

## 2022-09-21 NOTE — Telephone Encounter (Signed)
Patient notified and verbalized understanding. PCP copied.

## 2022-09-22 ENCOUNTER — Encounter (HOSPITAL_COMMUNITY): Payer: 59

## 2022-09-22 ENCOUNTER — Other Ambulatory Visit (HOSPITAL_COMMUNITY): Payer: 59

## 2022-09-25 ENCOUNTER — Encounter: Payer: Self-pay | Admitting: Psychiatry

## 2022-09-25 ENCOUNTER — Ambulatory Visit (INDEPENDENT_AMBULATORY_CARE_PROVIDER_SITE_OTHER): Payer: 59 | Admitting: Psychiatry

## 2022-09-25 ENCOUNTER — Telehealth: Payer: Self-pay | Admitting: Cardiovascular Disease

## 2022-09-25 VITALS — BP 132/76 | HR 61 | Ht 67.0 in | Wt 186.5 lb

## 2022-09-25 DIAGNOSIS — R55 Syncope and collapse: Secondary | ICD-10-CM | POA: Diagnosis not present

## 2022-09-25 DIAGNOSIS — I639 Cerebral infarction, unspecified: Secondary | ICD-10-CM | POA: Diagnosis not present

## 2022-09-25 DIAGNOSIS — R42 Dizziness and giddiness: Secondary | ICD-10-CM

## 2022-09-25 DIAGNOSIS — G43611 Persistent migraine aura with cerebral infarction, intractable, with status migrainosus: Secondary | ICD-10-CM | POA: Diagnosis not present

## 2022-09-25 MED ORDER — EMGALITY 120 MG/ML ~~LOC~~ SOAJ: 1.0000 | SUBCUTANEOUS | 7 refills | Status: DC

## 2022-09-25 MED ORDER — EMGALITY 120 MG/ML ~~LOC~~ SOAJ: 1.0000 | Freq: Once | SUBCUTANEOUS | 0 refills | Status: AC

## 2022-09-25 MED ORDER — UBRELVY 100 MG PO TABS: 100.0000 mg | ORAL_TABLET | ORAL | 6 refills | Status: DC | PRN

## 2022-09-25 NOTE — Telephone Encounter (Signed)
Pt stated he felt good this morning and approximately 5 mins after taking methimazole '10mg'$  he started experiencing nausea.

## 2022-09-25 NOTE — Telephone Encounter (Addendum)
Patent was seen in the ED and Lopressor was increased to 50 mg BID. Pt states that he was told to hold Lopressor it diastolic is 67 or lower. He reports that his blood pressure drops in the afternoon. Current BP is 119/73 Hr. 62. He has not yet taken his lopressor today. States that he wants to be on the correct dose. He reports stille having palpitations. Pt also states that he was given Duloxetine 30 mg by his PCP. He states that he read up on the medication and it said not to take while on blood thinners. Please advise.

## 2022-09-25 NOTE — Telephone Encounter (Signed)
Pt c/o medication issue:  1. Name of Medication:   metoprolol tartrate (LOPRESSOR) 25 MG tablet   2. How are you currently taking this medication (dosage and times per day)?   Not taking as prescribed  3. Are you having a reaction (difficulty breathing--STAT)?   No  4. What is your medication issue?   Patient stated his medication was increased to 50 mg when he had a ED visit.  Patient wants to know if he can reduce this medication back to 25 mg as needed.

## 2022-09-25 NOTE — Progress Notes (Signed)
[facility]  PATIENT: Steven [name] DOB: [birth year]  REFERRING CLINICIAN: Redmond School, MD HISTORY FROM: self REASON FOR VISIT: dizziness   HISTORICAL  CHIEF COMPLAINT:  Chief Complaint  Patient presents with   Room 2    Pt is here Alone. Pt states that he is having dizziness right now. Pt states that he is having a migraines right now. Pt states that he has nausea. Pt states that he has split second blackouts.     HISTORY OF PRESENT ILLNESS:  The patient presents for evaluation of dizziness. He has a history of chronic dizziness since 2020 when he was diagnosed with afib. He has had worsening dizziness and nausea since November 2023. He describes this as lightheadedness and a sensation like he will pass out. It is worse with valsalva and straining during bowel movements.  He is also having hot flashes, constipation, and diarrhea. Will get right-sided tinnitus and hear his heartbeat if he turns his head to the right. Denies vertigo or a spinning sensation. States he previously saw ENT and was told there was nothing they could do.  He was recently seen in the ED last month for afib with RVR. He is also hyperthyroid, with recent TSH last month at 0.015. He is taking methimazole and recently increased his dose.  He has a history of migraines for which he takes Aimovig and gabapentin. Aimovig has not been helpful and has been causing constipation. He continues to have headaches daily and is unsure if headaches and dizziness are connected with each other. Was prescribed Cymbalta but never started it. Previously took amitriptyline and thinks this helped, but he stopped it because he was afraid it was making him sick. Lorrin Goodell for rescue.   MRI brain 05/02/22 was unremarkable. MRI C-spine 04/06/20 with severe foraminal narrowing at C3-4 and C6-7. Follows with NSGY for cervical stenosis.  Prior Headache Therapies                                   Prevention: Amitriptyline 10 mg QHS Topamax 100 mg daily - kidney stones Losartan 100 mg daily Carvedilol 6.25 mg daily Metoprolol 25 mg BID Aimovig 70 mg - constipation Gabapentin 300 mg TID Imitrex 50 mg PRN - side effects Neck PT   Rescue: Nurtec 75 mg PRN - lack of efficacy Ubrelvy  Sumatriptan - palpitations Valium Dramamine meclizine Flexeril Robaxin Zofran phenergan  OTHER MEDICAL CONDITIONS: afib with RVR, HTN, OSA, hyperthyroidism, migraines, IBS     REVIEW OF SYSTEMS: Full 14 system review of systems performed and negative with exception of: headaches, dizziness, nausea  ALLERGIES: Allergies  Allergen Reactions   Latex Rash    Possible reaction to latex gloves per patient   Meclizine     Other reaction(s): Abdominal Pain, Other    HOME MEDICATIONS: Outpatient Medications Prior to Visit  Medication Sig Dispense Refill   acetaminophen (TYLENOL) 500 MG tablet Take 500 mg by mouth every 6 (six) hours as needed for moderate pain.     alfuzosin (UROXATRAL) 10 MG 24 hr tablet Take 1 tablet (10 mg total) by mouth daily with breakfast. 90 tablet 3   Cholecalciferol (VITAMIN D3) 125 MCG (5000 UT) CAPS Take 1 capsule by mouth daily.      ELIQUIS 5 MG TABS tablet TAKE 1 TABLET(5 MG) BY MOUTH TWICE DAILY (Patient taking differently: Take 5 mg by mouth 2 (two) times daily.) 60 tablet  5   gabapentin (NEURONTIN) 300 MG capsule Take 300 mg by mouth 3 (three) times daily as needed.     hydrochlorothiazide (HYDRODIURIL) 25 MG tablet Take 25 mg by mouth daily.     losartan (COZAAR) 50 MG tablet Take 1.5 tablets (75 mg total) by mouth daily. 90 tablet 2   methimazole (TAPAZOLE) 10 MG tablet Take 1 tablet (10 mg total) by mouth 2 (two) times daily. 60 tablet 2   metoprolol tartrate (LOPRESSOR) 25 MG tablet Take 1 tablet (25 mg total) by mouth 2 (two) times daily. 180 tablet 3   NEXLETOL 180 MG TABS Take 180 mg by mouth daily. Pt taking 2-3 times a week     ondansetron  (ZOFRAN-ODT) 4 MG disintegrating tablet Take 1 tablet (4 mg total) by mouth every 8 (eight) hours as needed for nausea or vomiting. 20 tablet 1   pantoprazole (PROTONIX) 40 MG tablet Take 1 tablet (40 mg total) by mouth daily. 30 tablet 1   polyethylene glycol (MIRALAX / GLYCOLAX) 17 g packet Take 17 g by mouth daily. 30 each 2   potassium chloride SA (KLOR-CON M) 20 MEQ tablet Take 1 tablet (20 mEq total) by mouth 2 (two) times daily.     Probiotic Product (PROBIOTIC-10 PO) Take 1 capsule by mouth daily.     vitamin B-12 (CYANOCOBALAMIN) 100 MCG tablet Take 100 mcg by mouth daily.     Erenumab-aooe (AIMOVIG) 70 MG/ML SOAJ Inject 1 mL into the skin every 30 (thirty) days.     amLODipine (NORVASC) 10 MG tablet Take 10 mg by mouth daily. (Patient not taking: Reported on 09/25/2022)     No facility-administered medications prior to visit.    PAST MEDICAL HISTORY: Past Medical History:  Diagnosis Date   Anxiety    Arthritis    Atrial fibrillation (HCC)    Atrial flutter (HCC)    BPH (benign prostatic hyperplasia)    Chest pain    Dizziness    Dyspnea    laying down occ   Fracture 08/17/2015   MULTIPLE RIB FRACTURES     FROM FALL    GERD (gastroesophageal reflux disease)    Hemorrhoids    History of kidney stones    noted on CT scan   History of radiation therapy 08/22/11-10/13/11   prostate   Hyperlipemia    Hypertension    Hyperthyroidism    IBS (irritable bowel syndrome)    Insomnia    Light headedness    Migraine    Numbness and tingling in left arm    Numbness and tingling of both legs    Open fracture of left elbow 08/18/2015   Prostate cancer [facility])    prostate s/p radiation Mar 2013   Rib fractures 08/17/2015   Tinnitus     PAST SURGICAL HISTORY: Past Surgical History:  Procedure Laterality Date   BOTOX INJECTION N/A 01/14/2020   Procedure: INJECTION OF BOTOX INTO ANAL SPHINCTER;  Surgeon: Ileana Roup, MD;  Location: [facility] ORS;  Service: General;  Laterality:  N/A;   COLONOSCOPY N/A 12/19/2012   [id number] 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 Normal appearing colon   COLONOSCOPY WITH PROPOFOL N/A 10/23/2019   Procedure: COLONOSCOPY WITH PROPOFOL;  Surgeon: Daneil Dolin, MD; external and grade 2 internal hemorrhoids, abnormal rectal blood vessels consistent with radiation proctitis s/p APC therapy, otherwise normal exam.   EVALUATION UNDER ANESTHESIA WITH ANAL FISTULECTOMY N/A 01/14/2020   Procedure: ANORECTAL  EXAM UNDER ANESTHESIA;  Surgeon: Ileana Roup, MD;  Location: [facility] OR[facility];  Service: General;  Laterality: N/A;   HOT HEMOSTASIS  10/23/2019   Procedure: HOT HEMOSTASIS (ARGON PLASMA COAGULATION/BICAP);  Surgeon: Daneil Dolin, MD;  Location: [facility]O SUITE;  Service: Endoscopy;;  apc rectal proctitis     KIDNEY SURGERY  1982   kidney tube collapse repair   RADIOACTIVE SEED IMPLANT     Prostate    FAMILY HISTORY: Family History  Problem Relation Age of Onset   Aneurysm Mother        aortic   Hypertension Mother    Headache Mother    Prostate cancer Father    Hypertension Father    Colon cancer Neg Hx    Colon polyps Neg Hx     SOCIAL HISTORY: Social History   Socioeconomic History   Marital status: Single    Spouse name: Not on file   Number of children: 2   Years of education: Not on file   Highest education level: 12th grade  Occupational History   Occupation: Custodian     Employer: [employer]  Tobacco Use   Smoking status: Former    Packs/day: 1.00    Years: 20.00    Total pack years: 20.00    Types: Cigarettes    Quit date: 2020    Years since quitting: 4.1   Smokeless tobacco: Never   Tobacco comments:    Quit smoking x 2 years    07/2015   SOMETIIMES i USE VAPOR   Vaping Use   Vaping Use: Never used  Substance and Sexual Activity   Alcohol use: Never   Drug use: Never   Sexual activity: Not Currently  Other Topics Concern    Not on file  Social History Narrative   Lives with friend   Divorced   No caffeine   Social Determinants of Health   Financial Resource Strain: Low Risk  (11/03/2020)   Overall Financial Resource Strain (CARDIA)    Difficulty of Paying Living Expenses: Not hard at all  Food Insecurity: No Food Insecurity (11/03/2020)   Hunger Vital Sign    Worried About Running Out of Food in the Last Year: Never true    Ran Out of Food in the Last Year: Never true  Transportation Needs: No Transportation Needs (11/03/2020)   PRAPARE - Hydrologist (Medical): No    Lack of Transportation (Non-Medical): No  Physical Activity: Inactive (11/03/2020)   Exercise Vital Sign    Days of Exercise per Week: 0 days    Minutes of Exercise per Session: 0 min  Stress: No Stress Concern Present (11/03/2020)   [place]    Feeling of Stress : Not at all  Social Connections: Socially Isolated (11/03/2020)   Social Connection and Isolation Panel [NHANES]    Frequency of Communication with Friends and Family: Twice a week    Frequency of Social Gatherings with Friends and Family: Once a week    Attends Religious Services: Never    Marine scientist or Organizations: No    Attends Archivist Meetings: Never    Marital Status: Divorced  Human resources officer Violence: Not At Risk (11/03/2020)   Humiliation, Afraid, Rape, and Kick questionnaire    Fear of Current or Ex-Partner: No    Emotionally Abused: No    Physically Abused: No    Sexually Abused: No  PHYSICAL EXAM  GENERAL EXAM/CONSTITUTIONAL: Vitals:  Vitals:   09/25/22 1251  BP: 132/76  Pulse: 61  Weight: 186 lb 8 oz (84.6 kg)  Height: '5\' 7"'$  (1.702 m)   Body mass index is 29.21 kg/m. Wt Readings from Last 3 Encounters:  09/25/22 186 lb 8 oz (84.6 kg)  09/14/22 189 lb 9.6 oz (86 kg)  09/08/22 175 lb 0.7 oz (79.4 kg)    NEUROLOGIC: MENTAL  STATUS:  awake, alert, oriented to person, place and time recent and remote memory intact normal attention and concentration  CRANIAL NERVE:  2nd, 3rd, 4th, 6th - pupils equal and reactive to light, visual fields full to confrontation, extraocular muscles intact, no nystagmus 5th - facial sensation symmetric 7th - facial strength symmetric 8th - hearing intact 9th - palate elevates symmetrically, uvula midline 11th - shoulder shrug symmetric 12th - tongue protrusion midline  MOTOR:  normal bulk and tone, full strength in the BUE, BLE  SENSORY:  normal and symmetric to light touch all 4 extremities  COORDINATION:  finger-nose-finger intact bilaterally  REFLEXES:  deep tendon reflexes present and symmetric  GAIT/STATION:  Unable to test today due to severe lightheadedness when standing     DIAGNOSTIC DATA (LABS, IMAGING, TESTING) - I reviewed patient records, labs, notes, testing and imaging myself where available.  Lab Results  Component Value Date   WBC 3.3 (L) 09/13/2022   HGB 13.6 09/13/2022   HCT 40.4 09/13/2022   MCV 88.6 09/13/2022   PLT 248 09/13/2022      Component Value Date/Time   NA 135 09/13/2022 1223   NA 141 11/10/2019 1051   K 3.7 09/13/2022 1223   CL 101 09/13/2022 1223   CO2 27 09/13/2022 1223   GLUCOSE 116 (H) 09/13/2022 1223   BUN <5 (L) 09/13/2022 1223   BUN 8 04/16/2020 0000   CREATININE 0.80 09/13/2022 1223   CALCIUM 9.9 09/13/2022 1223   PROT 6.9 09/13/2022 1223   ALBUMIN 4.0 09/13/2022 1223   AST 23 09/13/2022 1223   ALT 24 09/13/2022 1223   ALKPHOS 48 09/13/2022 1223   BILITOT 0.8 09/13/2022 1223   GFRNONAA >60 09/13/2022 1223   GFRAA >60 02/19/2020 1622   Lab Results  Component Value Date   CHOL 142 04/16/2020   HDL 48 04/16/2020   LDLCALC 79 04/16/2020   TRIG 74 04/16/2020   CHOLHDL 4.4 03/02/2019   No results found for: "HGBA1C" No results found for: "VITAMINB12" Lab Results  Component Value Date   TSH 0.015 (L)  09/11/2022      ASSESSMENT AND PLAN  72 y.o. male with a history of afib with RVR, HTN, OSA, hyperthyroidism, migraines, IBS who presents for evaluation of dizziness. He does not complain of vertigo symptoms today and instead describes a sensation of lightheadedness and presyncope, particularly with standing and valsalva. Will order CTA of the neck to assess for carotid stenosis. If this is unrevealing, his lightheadedness is likely secondary to his underlying uncontrolled hyperthyroidism and afib. Will switch Aimovig to [facility] for migraine prevention, which may also help with his constipation. Encouraged him to start Cymbalta as prescribed by his PCP, as this may help with both headaches and neuropathy.   1. Postural dizziness with presyncope   2. Vasovagal syncope   3. 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 Start Emgality 120 mg monthly  Orders Placed This Encounter  Procedures   CT  ANGIO NECK W OR WO CONTRAST    Meds ordered this encounter  Medications   Galcanezumab-gnlm (EMGALITY) 120 MG/ML SOAJ    Sig: Inject 1 Pen into the skin once for 1 dose. First dose only    Dispense:  2.24 mL    Refill:  0   Galcanezumab-gnlm (EMGALITY) 120 MG/ML SOAJ    Sig: Inject 1 Pen into the skin every 30 (thirty) days.    Dispense:  1.12 mL    Refill:  7   Ubrogepant (UBRELVY) 100 MG TABS    Sig: Take 1 tablet (100 mg total) by mouth as needed (for headaches). May repeat a dose in 2 hours if needed. Max dose 2 pills in 24 hours    Dispense:  16 tablet    Refill:  6    Return in about 8 months (around 05/28/2023).    Genia Harold, MD 09/25/22 2:06 PM  I spent an average of 61 minutes chart reviewing and counseling the patient, with at least 50% of the time face to face with the patient.   [facility] [address] [place], [place] [address] [phone]

## 2022-09-25 NOTE — Telephone Encounter (Signed)
Pt said he was feeling good this morning and then took his thyroid medicine and after starting feeling pretty sick as in nauseous/headache. He is asking for a call back

## 2022-09-26 ENCOUNTER — Telehealth: Payer: Self-pay | Admitting: Psychiatry

## 2022-09-26 NOTE — Telephone Encounter (Signed)
[employer] medicare NPR sent to [facility] [phone]

## 2022-09-27 ENCOUNTER — Telehealth: Payer: Self-pay | Admitting: *Deleted

## 2022-09-27 NOTE — Telephone Encounter (Signed)
Received call from patient [phone]~ telephone.   Patient again reports feeling sick. States that he stays nauseated and he continues to have bouts of constipation and diarrhea. States that he has lower abdominal pain that radiates towards his back. States that sometimes lower abdominal pain is on the left side, and sometimes it is on the right side.   Again reiterated that Sx are not consistent with gallbladder.  Advised to contact GI office for further recommendations.

## 2022-09-28 ENCOUNTER — Other Ambulatory Visit (INDEPENDENT_AMBULATORY_CARE_PROVIDER_SITE_OTHER): Payer: Self-pay | Admitting: *Deleted

## 2022-09-28 NOTE — Telephone Encounter (Signed)
Patient called concerned about having gas. Tried gas x. Has diarrhea and constipation back and forth. Reports he took one capful of miralax and it seems to cause nausea when he takes it. Reports zofran does not help. He has BM most days. If he increases miralax he has 3 -4 stools. He reports pantoprazole is not helping and thinks it makes him have nausea as well. He said he had some otc omeprazole he is going to try. Also has concerns with hemorrhoids and rectal pain. He reports he is having left arm pain and left sided chest pain and had stress test last week. Reports he called cardiologist this morning and reported symptoms. I advised him if having chest pain he should go to ED for chest pain. He verbalized understanding.   [phone]

## 2022-09-28 NOTE — Addendum Note (Signed)
Addended by: Levonne Hubert on: 09/28/2022 10:50 AM   Modules accepted: Orders

## 2022-09-28 NOTE — Telephone Encounter (Signed)
Steven [name], I consulted with [name] about this patient and she wanted him to do in office appt with any provider that had an opening. Please call patient and schedule. Thanks  [phone]

## 2022-09-28 NOTE — Telephone Encounter (Signed)
Pt notified of Dr. Kyla Balzarine response. All questions answered.

## 2022-09-28 NOTE — Telephone Encounter (Signed)
thanks

## 2022-09-29 ENCOUNTER — Ambulatory Visit: Payer: 59 | Admitting: Internal Medicine

## 2022-09-30 ENCOUNTER — Emergency Department (HOSPITAL_COMMUNITY)
Admission: EM | Admit: 2022-09-30 | Discharge: 2022-09-30 | Disposition: A | Discharge status: Home / Self Care | Payer: 59 | Attending: Emergency Medicine | Admitting: Emergency Medicine

## 2022-09-30 ENCOUNTER — Encounter (HOSPITAL_COMMUNITY): Payer: Self-pay | Admitting: Emergency Medicine

## 2022-09-30 ENCOUNTER — Other Ambulatory Visit: Payer: Self-pay

## 2022-09-30 ENCOUNTER — Emergency Department (HOSPITAL_COMMUNITY): Payer: 59

## 2022-09-30 DIAGNOSIS — Z91040 Latex allergy status: Secondary | ICD-10-CM | POA: Insufficient documentation

## 2022-09-30 DIAGNOSIS — E039 Hypothyroidism, unspecified: Secondary | ICD-10-CM | POA: Insufficient documentation

## 2022-09-30 DIAGNOSIS — Z79899 Other long term (current) drug therapy: Secondary | ICD-10-CM | POA: Insufficient documentation

## 2022-09-30 DIAGNOSIS — R079 Chest pain, unspecified: Secondary | ICD-10-CM | POA: Diagnosis not present

## 2022-09-30 DIAGNOSIS — R197 Diarrhea, unspecified: Secondary | ICD-10-CM | POA: Diagnosis not present

## 2022-09-30 DIAGNOSIS — I10 Essential (primary) hypertension: Secondary | ICD-10-CM | POA: Insufficient documentation

## 2022-09-30 DIAGNOSIS — E876 Hypokalemia: Secondary | ICD-10-CM | POA: Diagnosis not present

## 2022-09-30 DIAGNOSIS — R5383 Other fatigue: Secondary | ICD-10-CM | POA: Diagnosis not present

## 2022-09-30 DIAGNOSIS — Z743 Need for continuous supervision: Secondary | ICD-10-CM | POA: Diagnosis not present

## 2022-09-30 DIAGNOSIS — Z8546 Personal history of malignant neoplasm of prostate: Secondary | ICD-10-CM | POA: Diagnosis not present

## 2022-09-30 DIAGNOSIS — R531 Weakness: Secondary | ICD-10-CM | POA: Diagnosis not present

## 2022-09-30 DIAGNOSIS — Z7901 Long term (current) use of anticoagulants: Secondary | ICD-10-CM | POA: Diagnosis not present

## 2022-09-30 DIAGNOSIS — R0789 Other chest pain: Secondary | ICD-10-CM | POA: Diagnosis not present

## 2022-09-30 LAB — COMPREHENSIVE METABOLIC PANEL
ALT: 16 U/L (ref 0–44)
AST: 21 U/L (ref 15–41)
Albumin: 4 g/dL (ref 3.5–5.0)
Alkaline Phosphatase: 51 U/L (ref 38–126)
Anion gap: 10 (ref 5–15)
BUN: <5 mg/dL — ABNORMAL LOW (ref 8–23)
CO2: 23 mmol/L (ref 22–32)
Calcium: 9.7 mg/dL (ref 8.9–10.3)
Chloride: 103 mmol/L (ref 98–111)
Creatinine, Ser: 0.8 mg/dL (ref 0.61–1.24)
GFR, Estimated: >60 mL/min (ref >60)
Glucose, Bld: 114 mg/dL — ABNORMAL HIGH (ref 70–99)
Potassium: 3 mmol/L — ABNORMAL LOW (ref 3.5–5.1)
Sodium: 136 mmol/L (ref 135–145)
Total Bilirubin: 0.9 mg/dL (ref 0.3–1.2)
Total Protein: 7.3 g/dL (ref 6.5–8.1)

## 2022-09-30 LAB — CBC WITH DIFFERENTIAL/PLATELET
Abs Immature Granulocytes: 0.01 10*3/uL (ref 0.00–0.07)
Basophils Absolute: 0 10*3/uL (ref 0.0–0.1)
Basophils Relative: 1 %
Eosinophils Absolute: 0.1 10*3/uL (ref 0.0–0.5)
Eosinophils Relative: 2 %
HCT: 40.7 % (ref 39.0–52.0)
Hemoglobin: 13.9 g/dL (ref 13.0–17.0)
Immature Granulocytes: 0 %
Lymphocytes Relative: 46 %
Lymphs Abs: 1.4 10*3/uL (ref 0.7–4.0)
MCH: 29.6 pg (ref 26.0–34.0)
MCHC: 34.2 g/dL (ref 30.0–36.0)
MCV: 86.6 fL (ref 80.0–100.0)
Monocytes Absolute: 0.3 10*3/uL (ref 0.1–1.0)
Monocytes Relative: 8 %
Neutro Abs: 1.3 10*3/uL — ABNORMAL LOW (ref 1.7–7.7)
Neutrophils Relative %: 43 %
Platelets: 196 10*3/uL (ref 150–400)
RBC: 4.7 MIL/uL (ref 4.22–5.81)
RDW: 12 % (ref 11.5–15.5)
WBC: 3.1 10*3/uL — ABNORMAL LOW (ref 4.0–10.5)
nRBC: 0 % (ref 0.0–0.2)

## 2022-09-30 LAB — MAGNESIUM: Magnesium: 2 mg/dL (ref 1.7–2.4)

## 2022-09-30 LAB — TROPONIN I (HIGH SENSITIVITY): Troponin I (High Sensitivity): 11 ng/L (ref <18)

## 2022-09-30 MED ORDER — POTASSIUM CHLORIDE CRYS ER 20 MEQ PO TBCR: 20.0000 meq | EXTENDED_RELEASE_TABLET | Freq: Every day | ORAL | 0 refills | Status: DC

## 2022-09-30 MED ORDER — SODIUM CHLORIDE 0.9 % IV BOLUS
500.0000 mL | Freq: Once | INTRAVENOUS | Status: AC
  Administered 2022-09-30: 500 mL via INTRAVENOUS

## 2022-09-30 MED ORDER — POTASSIUM CHLORIDE CRYS ER 20 MEQ PO TBCR
40.0000 meq | EXTENDED_RELEASE_TABLET | Freq: Once | ORAL | Status: AC
  Administered 2022-09-30: 40 meq via ORAL
  Filled 2022-09-30: qty 2

## 2022-09-30 MED ORDER — ACETAMINOPHEN 325 MG PO TABS
650.0000 mg | ORAL_TABLET | Freq: Once | ORAL | Status: AC
  Administered 2022-09-30: 650 mg via ORAL
  Filled 2022-09-30: qty 2

## 2022-09-30 NOTE — ED Triage Notes (Signed)
Patient brought in via EMS from home. Alert and oriented. Airway patent. Patient has multiple complaints. Patient c/o central chest tightness with left arm pain. Per patient shortness of breath, nausea, and weakness, and dizziness. Patient also reports that he had constipation yesterday and took MOM and trulance and has had diarrhea since. Denies any pain any where else at this time. Patient's shortness of breath worse with exertion. Per paramedics orthostatic positive. Laying HR 82, BP 150/82; Standing HR 114, BP 114/78. Patient NSR per paramedic and blood sugar 117.

## 2022-09-30 NOTE — ED Provider Notes (Signed)
[facility] Provider Note   CSN: [id number] Arrival date & time: 09/30/22  1040     History  Chief Complaint  Patient presents with   Chest Pain    Steven [name] is a 72 y.o. male.   Chest Pain   72 year old male presents emergency department with complaints of shortness of breath, chest pain, diffuse weakness, dehydration.  Patient reports chronic history of symptoms over the past several months.  States he has had generalized weakness for the past several months and states it is "usually to my low potassium."  States this has been unchanged in the recent history.  Reports intermittent chest pain that he states is associated with having a bowel movement.  States that it "travels around my chest" and gets better after having a bowel movement.  Patient also states he has left arm pain radiating from his fingertips to his left shoulder.  Denies history of traumatic mechanism and states that this is also been present for the past several months.  Reports some feelings of shortness of breath with exertion.  States he had a negative cardiac stress test last week by his cardiologist.  States that the shortness of breath with exertion has been present for the past several months and remains unchanged in the recent history.  States he has also dizziness described as feeling faint.  States that feelings of dizziness are worsened with positional changes such as getting up from a laying down/seated position.  States that once he is standing, symptoms resolve and he is able to ambulate without difficulty.  Patient also says that he is felt constipated over the past 2 to 3 days but took laxative earlier this morning with full regular bowel movement.  Denies visual disturbance from baseline, acute changes in weakness/sensory deficits in upper or lower extremities, slurred speech, drooping of the face.  Denies fever, chills, night sweats, cough, congestion, abdominal  pain, nausea, vomiting, urinary symptoms. Patient has been seen in the emergency department multiple times for the same complaints with overall negative workups in the past few months.  Past medical history significant for atrial fibrillation on Eliquis with no missed doses, hypertension, migraine, BPH/prostate cancer, hypothyroidism, hyperlipidemia, dizziness, OSA  Home Medications Prior to Admission medications   Medication Sig Start Date End Date Taking? Authorizing Provider  potassium chloride SA (KLOR-CON M) 20 MEQ tablet Take 1 tablet (20 mEq total) by mouth daily. 09/30/22  Yes Dion Saucier A, PA  acetaminophen (TYLENOL) 500 MG tablet Take 500 mg by mouth every 6 (six) hours as needed for moderate pain.    [provider]  alfuzosin (UROXATRAL) 10 MG 24 hr tablet Take 1 tablet (10 mg total) by mouth daily with breakfast. 04/07/22   [name], Candee Furbish, MD  amLODipine (NORVASC) 10 MG tablet Take 10 mg by mouth daily. Patient not taking: Reported on 09/25/2022 08/24/22   [provider]  Cholecalciferol (VITAMIN D3) 125 MCG (5000 UT) CAPS Take 1 capsule by mouth daily.     [provider]  ELIQUIS 5 MG TABS tablet TAKE 1 TABLET(5 MG) BY MOUTH TWICE DAILY Patient taking differently: Take 5 mg by mouth 2 (two) times daily. 04/24/22   Josue Hector, MD  gabapentin (NEURONTIN) 300 MG capsule Take 300 mg by mouth 3 (three) times daily as needed. 02/24/20   [provider]  Galcanezumab-gnlm (EMGALITY) 120 MG/ML SOAJ Inject 1 Pen into the skin every 30 (thirty) days. 09/25/22  Genia Harold, MD  hydrochlorothiazide (HYDRODIURIL) 25 MG tablet Take 25 mg by mouth daily. 07/28/19   [provider]  losartan (COZAAR) 50 MG tablet Take 1.5 tablets (75 mg total) by mouth daily. 08/23/22   Barton Dubois, MD  methimazole (TAPAZOLE) 10 MG tablet Take 1 tablet (10 mg total) by mouth 2 (two) times daily. 08/23/22   Barton Dubois, MD  metoprolol tartrate (LOPRESSOR) 25  MG tablet Take 1 tablet (25 mg total) by mouth 2 (two) times daily. 09/08/22 09/03/23  Josue Hector, MD  NEXLETOL 180 MG TABS Take 180 mg by mouth daily. Pt taking 2-3 times a week 12/03/19   [provider]  ondansetron (ZOFRAN-ODT) 4 MG disintegrating tablet Take 1 tablet (4 mg total) by mouth every 8 (eight) hours as needed for nausea or vomiting. 08/23/22   Barton Dubois, MD  pantoprazole (PROTONIX) 40 MG tablet Take 1 tablet (40 mg total) by mouth daily. 08/23/22   Barton Dubois, MD  polyethylene glycol (MIRALAX / GLYCOLAX) 17 g packet Take 17 g by mouth daily. 08/23/22   Barton Dubois, MD  potassium chloride SA (KLOR-CON M) 20 MEQ tablet Take 1 tablet (20 mEq total) by mouth 2 (two) times daily. 08/23/22   Barton Dubois, MD  Probiotic Product (PROBIOTIC-10 PO) Take 1 capsule by mouth daily.    [provider]  Ubrogepant (UBRELVY) 100 MG TABS Take 1 tablet (100 mg total) by mouth as needed (for headaches). May repeat a dose in 2 hours if needed. Max dose 2 pills in 24 hours 09/25/22   Genia Harold, MD  vitamin B-12 (CYANOCOBALAMIN) 100 MCG tablet Take 100 mcg by mouth daily.    [provider]      Allergies    Latex and Meclizine    Review of Systems   Review of Systems  Cardiovascular:  Positive for chest pain.  All other systems reviewed and are negative.   Physical Exam Updated Vital Signs BP (!) 147/79 (BP Location: Left Arm)   Pulse 69   Temp 98.3 F (36.8 C) (Oral)   Resp 18   Ht '5\' 7"'$  (1.702 m)   Wt 84.4 kg   SpO2 100%   BMI 29.13 kg/m  Physical Exam Vitals and nursing note reviewed.  Constitutional:      General: He is not in acute distress.    Appearance: He is well-developed.  HENT:     Head: Normocephalic and atraumatic.  Eyes:     Conjunctiva/sclera: Conjunctivae normal.  Cardiovascular:     Rate and Rhythm: Normal rate and regular rhythm.     Pulses: Normal pulses.     Heart sounds: No murmur heard. Pulmonary:     Effort:  Pulmonary effort is normal. No respiratory distress.     Breath sounds: Normal breath sounds. No wheezing, rhonchi or rales.  Abdominal:     Palpations: Abdomen is soft.     Tenderness: There is no abdominal tenderness. There is no right CVA tenderness, left CVA tenderness, guarding or rebound.  Musculoskeletal:        General: No swelling.     Cervical back: Neck supple.     Right lower leg: No edema.     Left lower leg: No edema.     Comments: Patient has full range of motion of bilateral upper extremities at shoulders, elbows, wrists, digits.  Radial pulses 2+ bilaterally.  Muscular strength 5 out of 5 for grip, elbow flexion/extension.  Compartments soft and supple.  No  overlying erythema, palpable fluctuance, induration.  No obvious swelling appreciated.  Skin:    General: Skin is warm and dry.     Capillary Refill: Capillary refill takes less than 2 seconds.  Neurological:     Mental Status: He is alert.     Comments: Alert and oriented to self, place, time and event.   Speech is fluent, clear without dysarthria or dysphasia.   Strength 5/5 in upper/lower extremities   Sensation intact in upper/lower extremities   Patient able to ambulate without assistance.  CN I not tested  CN II not tested CN III, IV, VI PERRLA and EOMs intact bilaterally  CN V Intact sensation to sharp and light touch to the face  CN VII facial movements symmetric  CN VIII not tested  CN IX, X no uvula deviation, symmetric rise of soft palate  CN XI 5/5 SCM and trapezius strength bilaterally  CN XII Midline tongue protrusion, symmetric L/R movements     Psychiatric:        Mood and Affect: Mood normal.     ED Results / Procedures / Treatments   Labs (all labs ordered are listed, but only abnormal results are displayed) Labs Reviewed  COMPREHENSIVE METABOLIC PANEL - Abnormal; Notable for the following components:      Result Value   Potassium 3.0 (*)    Glucose, Bld 114 (*)    BUN <5 (*)     All other components within normal limits  CBC WITH DIFFERENTIAL/PLATELET - Abnormal; Notable for the following components:   WBC 3.1 (*)    Neutro Abs 1.3 (*)    All other components within normal limits  MAGNESIUM  TROPONIN I (HIGH SENSITIVITY)    EKG EKG Interpretation  Date/Time:  Saturday September 30 2022 11:08:38 EST Ventricular Rate:  78 PR Interval:  159 QRS Duration: 86 QT Interval:  375 QTC Calculation: 428 R Axis:   -5 Text Interpretation: Sinus rhythm Borderline T abnormalities, inferior leads since last tracing no significant change Confirmed by Noemi Chapel [phone]) on 09/30/2022 11:26:57 AM  Radiology DG Chest Port 1 View  Result Date: 09/30/2022 CLINICAL DATA:  Chest pain EXAM: PORTABLE CHEST 1 VIEW COMPARISON:  09/09/2022, 06/22/2022 FINDINGS: Stable cardiomediastinal contours. Unchanged left basilar scarring with chronic blunting of the left costophrenic angle. Lungs are otherwise clear. No pleural effusion or pneumothorax. IMPRESSION: No active disease. Electronically Signed   By: Davina Poke D.O.   On: 09/30/2022 11:30    Procedures Procedures    Medications Ordered in ED Medications  sodium chloride 0.9 % bolus 500 mL (0 mLs Intravenous Stopped 09/30/22 1237)  acetaminophen (TYLENOL) tablet 650 mg (650 mg Oral Given 09/30/22 1229)  potassium chloride SA (KLOR-CON M) CR tablet 40 mEq (40 mEq Oral Given 09/30/22 1229)    ED Course/ Medical Decision Making/ A&P                             Medical Decision Making Amount and/or Complexity of Data Reviewed Labs: ordered. Radiology: ordered.   This patient presents to the ED for concern of generalized weakness, chest pain, left arm pain, this involves an extensive number of treatment options, and is a complaint that carries with it a high risk of complications and morbidity.  The differential diagnosis includes, PE, pneumothorax, anemia, electrolyte abnormalities, aortic dissection/aneurysm,  tamponade/myocarditis/pericarditis, dehydration   Co morbidities that complicate the patient evaluation  See HPI   Additional history obtained:  Additional history obtained from EMR External records from outside source obtained and reviewed including hospital records   Lab Tests:  I Ordered, and personally interpreted labs.  The pertinent results include: Leukopenia with a white count of 3.1 of which seems to be patient's baseline from prior laboratory studies performed.  No evidence of anemia.  Platelets within range.  Mild hypokalemia with potassium at 3.0 of which supplemented orally while emergency department; patient has history of hypokalemia but states has been out of his at home vitamin supplementation provided by his primary care provider.  No transaminitis.  No renal dysfunction.  Mag within normal limits.  Initial troponin of 11; second troponin considered but due to duration of patient's chest pain, unchanged nature over the past several months and no changes on EKG, very low suspicion for ACS.   Imaging Studies ordered:  I ordered imaging studies including chest x-ray I independently visualized and interpreted imaging which showed no acute cardiopulmonary abnormalities I agree with the radiologist interpretation   Cardiac Monitoring: / EKG:  The patient was maintained on a cardiac monitor.  I personally viewed and interpreted the cardiac monitored which showed an underlying rhythm of: Sinus rhythm with borderline T wave changes in inferior leads.  No acute changes from prior EKGs performed.   Consultations Obtained:  I requested consultation with attending physician Dr. Sabra Heck who is in agreement with treatment plan going forward.  Problem List / ED Course / Critical interventions / Medication management  Generalized weakness, hypokalemia, chest pain I ordered medication including 500 cc normal saline, Tylenol, potassium chloride   Reevaluation of the patient after  these medicines showed that the patient improved I have reviewed the patients home medicines and have made adjustments as needed   Social Determinants of Health:  Former cigarette use.  Denies illicit drug use.   Test / Admission - Considered:  Generalized weakness, hypokalemia, chest pain Vitals signs significant for mild hypertension with blood pressure 147/79.  Recommend follow-up with primary care regarding outpatient blood pressure.. Otherwise within normal range and stable throughout visit. Laboratory/imaging studies significant for: See above 72 year old male presents emergency department with a myriad of complaints all seem to be chronic in nature and has been evaluated multiple times in the emergency department. Regarding chest pain.  Chest pain very nonspecific in nature which seems to be related to having bowel movements per patient.  No evidence of pneumothorax, pneumonia.  Low suspicion for ACS given no ischemic changes on EKG, duration of symptoms described and initial negative troponin.  Patient with risk factors for PE of prior prostate malignancy but patient is on Eliquis for atrial fibrillation.  Chest pain not pleuritic in nature with no shortness of breath, tachycardia so very low suspicion for PE.  Doubt aortic dissection, tamponade/myocarditis/pericarditis. Regarding dizziness.  Patient's dizziness seems to be positionally related with resolution of symptoms when remaining in position for duration of time.  Patient without neurologic deficit able to ambulate independently without diplopia, dysphagia.  Low suspicion for CVA with most likely cause being orthostatic hypotension.  Orthostatics performed by EMS which were positive.  Patient without syncope. Regarding patient's generalized weakness.  Patient with generalized weakness for the past several months if not years.  Remains unchanged in nature with no acute unilateral worsening.  Patient tolerating p.o. without difficulty,  afebrile, well-appearing in no acute distress.  Recommend follow-up with primary care regarding generalized weakness.  Consider flu/COVID/RSV testing but patient without URI type symptoms so deemed unnecessary. Patient with evidence  of hypokalemia of which seems to be chronic in nature as well.  This could be contributing to patient's feelings of generalized weakness.  Supplemented while in the emergency department and recommended continued use outpatient for the next few days. Patient noted significant improvement of symptoms with administration of IV fluids, oral potassium and Tylenol.  Again, stressed strict return precautions as well as emphasized importance of follow-up with primary care for chronic complaints.  Further workup deemed unnecessary at this time while in the emergency department. Worrisome signs and symptoms were discussed with the patient, and the patient acknowledged understanding to return to the ED if noticed. Patient was stable upon discharge.          Final Clinical Impression(s) / ED Diagnoses Final diagnoses:  Weakness generalized  Chest pain, unspecified type  Hypokalemia    Rx / DC Orders ED Discharge Orders          Ordered    potassium chloride SA (KLOR-CON M) 20 MEQ tablet  Daily        09/30/22 1239              Wilnette Kales, [place] 09/30/22 1351    Noemi Chapel, MD 10/01/22 [phone]

## 2022-09-30 NOTE — Discharge Instructions (Addendum)
Note the workup today was overall reassuring.  As discussed, recommend follow-up with primary care/cardiology for reassessment of your symptoms.  Take potassium supplementation as your potassium was slightly low today.  Please do not hesitate to return to emergency department for worrisome signs and symptoms we discussed become apparent.

## 2022-10-02 ENCOUNTER — Telehealth: Payer: Self-pay

## 2022-10-02 DIAGNOSIS — E0590 Thyrotoxicosis, unspecified without thyrotoxic crisis or storm: Secondary | ICD-10-CM

## 2022-10-02 NOTE — Telephone Encounter (Signed)
Pt called stating he was seen Friday in the ER. States he has been experiencing pain over the weekend, headache, pain in his gums and teeth.

## 2022-10-03 ENCOUNTER — Ambulatory Visit (INDEPENDENT_AMBULATORY_CARE_PROVIDER_SITE_OTHER): Payer: 59 | Admitting: Internal Medicine

## 2022-10-03 ENCOUNTER — Telehealth: Payer: Self-pay | Admitting: *Deleted

## 2022-10-03 ENCOUNTER — Encounter: Payer: Self-pay | Admitting: Internal Medicine

## 2022-10-03 ENCOUNTER — Other Ambulatory Visit: Payer: Self-pay | Admitting: *Deleted

## 2022-10-03 VITALS — BP 126/77 | HR 71 | Temp 98.4°F | Ht 67.0 in | Wt 184.2 lb

## 2022-10-03 DIAGNOSIS — E0590 Thyrotoxicosis, unspecified without thyrotoxic crisis or storm: Secondary | ICD-10-CM

## 2022-10-03 DIAGNOSIS — R1032 Left lower quadrant pain: Secondary | ICD-10-CM

## 2022-10-03 DIAGNOSIS — R110 Nausea: Secondary | ICD-10-CM | POA: Diagnosis not present

## 2022-10-03 DIAGNOSIS — K581 Irritable bowel syndrome with constipation: Secondary | ICD-10-CM | POA: Diagnosis not present

## 2022-10-03 NOTE — Telephone Encounter (Signed)
Called and spoke with pt in regards to being referred to [facility] or [facility]. Pt says he is not sure, that he would talk it over with his wife and give me a call back.

## 2022-10-03 NOTE — Telephone Encounter (Signed)
Discussed with pt, understanding voiced. 

## 2022-10-03 NOTE — Progress Notes (Signed)
Primary Care Physician:  Redmond School, MD Primary Gastroenterologist:  Dr. Gala Romney  Pre-Procedure History & Physical: HPI:  Steven [name] is a 72 y.o. male here for follow-up of bowel complaints.  He is frustrated he has numerous GI and non-GI complaints.  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  Rectal pain off and on.  It seems that he is never really gotten onto a regimen which helps him.  Failed Trulance MiraLAX suboptimal recurrent rectal pain with diltiazem; fleeting  left-sided abdominal and chest pain.  Seen in ED.  [facility] previously for dizziness.  ED visit for chest discomfort previously workup negative.  He does have cholelithiasis.  He continues to be anticoagulated with Eliquis for atrial fibrillation.  Minimally anorexic. Patient now endorses quite a bit of loose stools.  Denies blood per rectum.  This time.  Colonoscopy 2021 for hematochezia demonstrated some neovascular changes in the rectum consistent with radiation effect treated with APC. GERD well-controlled on PPI dysphagia nausea or vomiting. CT last month abdomen and pelvis demonstrate cholelithiasis without complicating factors simple hepatic cyst nonobstructing right renal stone. It is notable it seems he is lost 22 pounds since December based on our weights. Seeing Dr. Dorris Fetch for significant hyperthyroidism.  He is on treatment.  Recent TSH values  less than.02.    He is frustrated.  He wants to be sent back to the [facility] to get "straightened out".  Past Medical History:  Diagnosis Date   Anxiety    Arthritis    Atrial fibrillation (HCC)    Atrial flutter (HCC)    BPH (benign prostatic hyperplasia)    Chest pain    Dizziness    Dyspnea    laying down occ   Fracture 08/17/2015   MULTIPLE RIB FRACTURES      FROM FALL    GERD (gastroesophageal reflux disease)    Hemorrhoids    History of kidney stones    noted on CT scan   History of radiation therapy 08/22/11-10/13/11   prostate   Hyperlipemia    Hypertension    Hyperthyroidism    IBS (irritable bowel syndrome)    Insomnia    Light headedness    Migraine    Numbness and tingling in left arm    Numbness and tingling of both legs    Open fracture of left elbow 08/18/2015   Prostate cancer [facility])    prostate s/p radiation Mar 2013   Rib fractures 08/17/2015   Tinnitus     Past Surgical History:  Procedure Laterality Date   BOTOX INJECTION N/A 01/14/2020   Procedure: INJECTION OF BOTOX INTO ANAL SPHINCTER;  Surgeon: Ileana Roup, MD;  Location: [facility] ORS;  Service: General;  Laterality: N/A;   COLONOSCOPY N/A 12/19/2012   [id number] 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 Normal appearing colon   COLONOSCOPY WITH PROPOFOL N/A 10/23/2019   Procedure: COLONOSCOPY WITH PROPOFOL;  Surgeon: Daneil Dolin, MD; external and grade 2 internal hemorrhoids, abnormal rectal blood vessels consistent with radiation proctitis s/p APC therapy, otherwise normal exam.   EVALUATION UNDER ANESTHESIA WITH ANAL FISTULECTOMY N/A 01/14/2020   Procedure: ANORECTAL EXAM UNDER ANESTHESIA;  Surgeon: Ileana Roup, MD;  Location: [facility] ORS;  Service: General;  Laterality: N/A;   HOT HEMOSTASIS  10/23/2019   Procedure: HOT HEMOSTASIS (ARGON PLASMA COAGULATION/BICAP);  Surgeon: Daneil Dolin, MD;  Location: [facility] SUITE;  Service: Endoscopy;;  apc rectal proctitis     KIDNEY SURGERY  1982   kidney tube collapse repair   RADIOACTIVE SEED IMPLANT     Prostate    Prior to Admission medications   Medication Sig Start Date End Date Taking? Authorizing Provider  acetaminophen (TYLENOL) 500 MG tablet Take 500 mg by mouth every 6 (six) hours as needed for moderate pain.   Yes [provider]   alfuzosin (UROXATRAL) 10 MG 24 hr tablet Take 1 tablet (10 mg total) by mouth daily with breakfast. 04/07/22  Yes [name], Candee Furbish, MD  Cholecalciferol (VITAMIN D3) 125 MCG (5000 UT) CAPS Take 1 capsule by mouth daily.    Yes [provider]  DULoxetine (CYMBALTA) 30 MG capsule Take 30 mg by mouth daily.   Yes [provider]  ELIQUIS 5 MG TABS tablet TAKE 1 TABLET(5 MG) BY MOUTH TWICE DAILY Patient taking differently: Take 5 mg by mouth 2 (two) times daily. 04/24/22  Yes Josue Hector, MD  gabapentin (NEURONTIN) 300 MG capsule Take 300 mg by mouth 3 (three) times daily as needed. 02/24/20  Yes [provider]  Galcanezumab-gnlm (EMGALITY) 120 MG/ML SOAJ Inject 1 Pen into the skin every 30 (thirty) days. 09/25/22  Yes Genia Harold, MD  hydrochlorothiazide (HYDRODIURIL) 25 MG tablet Take 25 mg by mouth daily. 07/28/19  Yes [provider]  losartan (COZAAR) 50 MG tablet Take 1.5 tablets (75 mg total) by mouth daily. 08/23/22  Yes Barton Dubois, MD  methimazole (TAPAZOLE) 10 MG tablet Take 1 tablet (10 mg total) by mouth 2 (two) times daily. 08/23/22  Yes Barton Dubois, MD  metoprolol tartrate (LOPRESSOR) 25 MG tablet Take 1 tablet (25 mg total) by mouth 2 (two) times daily. 09/08/22 09/03/23 Yes Josue Hector, MD  NEXLETOL 180 MG TABS Take 180 mg by mouth daily. Pt taking 2-3 times a week 12/03/19  Yes [provider]  ondansetron (ZOFRAN-ODT) 4 MG disintegrating tablet Take 1 tablet (4 mg total) by mouth every 8 (eight) hours as needed for nausea or vomiting. 08/23/22  Yes Barton Dubois, MD  pantoprazole (PROTONIX) 40 MG tablet Take 1 tablet (40 mg total) by mouth daily. 08/23/22  Yes Barton Dubois, MD  polyethylene glycol (MIRALAX / GLYCOLAX) 17 g packet Take 17 g by mouth daily. 08/23/22  Yes Barton Dubois, MD  potassium chloride SA (KLOR-CON M) 20 MEQ tablet Take 1 tablet (20 mEq total) by mouth 2 (two) times daily. Patient taking differently: Take 80  mEq by mouth daily. 08/23/22  Yes Barton Dubois, MD  Probiotic Product (PROBIOTIC-10 PO) Take 1 capsule by mouth daily.   Yes [provider]  Ubrogepant (UBRELVY) 100 MG TABS Take 1 tablet (100 mg total) by mouth as needed (for headaches). May repeat a dose in 2 hours if needed. Max dose 2 pills in 24 hours 09/25/22  Yes Chima, Anderson Malta, MD  vitamin B-12 (CYANOCOBALAMIN) 100 MCG tablet Take 100 mcg by mouth daily.   Yes [provider]    Allergies as of 10/03/2022 - Review Complete 10/03/2022  Allergen Reaction Noted   Latex Rash 03/25/2020   Meclizine  09/29/2021    Family History  Problem Relation Age of Onset   Aneurysm Mother        aortic   Hypertension Mother    Headache Mother    Prostate cancer  Father    Hypertension Father    Colon cancer Neg Hx    Colon polyps Neg Hx     Social History   Socioeconomic History   Marital status: Single    Spouse name: Not on file   Number of children: 2   Years of education: Not on file   Highest education level: 12th grade  Occupational History   Occupation: Custodian     Employer: [employer]  Tobacco Use   Smoking status: Former    Packs/day: 1.00    Years: 20.00    Total pack years: 20.00    Types: Cigarettes    Quit date: 2020    Years since quitting: 4.1   Smokeless tobacco: Never   Tobacco comments:    Quit smoking x 2 years    07/2015   SOMETIIMES i USE VAPOR   Vaping Use   Vaping Use: Never used  Substance and Sexual Activity   Alcohol use: Never   Drug use: Never   Sexual activity: Not Currently  Other Topics Concern   Not on file  Social History Narrative   Lives with friend   Divorced   No caffeine   Social Determinants of Health   Financial Resource Strain: [place]  (11/03/2020)   Overall Financial Resource Strain (CARDIA)    Difficulty of Paying Living Expenses: Not hard at all  Food Insecurity: No [place] (11/03/2020)   Hunger Vital Sign    Worried About  Running Out of Food in the Last Year: Never true    [place] in the Last Year: Never true  Transportation Needs: No Transportation Needs (11/03/2020)   PRAPARE - Hydrologist (Medical): No    Lack of Transportation (Non-Medical): No  Physical Activity: Inactive (11/03/2020)   Exercise Vital Sign    Days of Exercise per Week: 0 days    Minutes of Exercise per Session: 0 min  Stress: No Stress Concern Present (11/03/2020)   [place]    Feeling of Stress : Not at all  Social Connections: Socially Isolated (11/03/2020)   Social Connection and Isolation Panel [NHANES]    Frequency of Communication with Friends and Family: Twice a week    Frequency of Social Gatherings with Friends and Family: Once a week    Attends Religious Services: Never    Marine scientist or Organizations: No    Attends Archivist Meetings: Never    Marital Status: Divorced  Human resources officer Violence: Not At Risk (11/03/2020)   Humiliation, Afraid, Rape, and Kick questionnaire    Fear of Current or Ex-Partner: No    Emotionally Abused: No    Physically Abused: No    Sexually Abused: No    Review of Systems: See HPI, otherwise negative ROS  Physical Exam: BP 126/77 (BP Location: Right Arm, Patient Position: Sitting, Cuff Size: Large)   Pulse 71   Temp 98.4 F (36.9 C) (Oral)   Ht 5\' 7"  (1.702 m)   Wt 184 lb 3.2 oz (83.6 kg)   SpO2 98%   BMI 28.85 kg/m  General:   Somewhat disheveled pleasant and cooperative in NAD.  He does indeed appear frustrated. Neck:  Supple; no masses or thyromegaly. No significant cervical adenopathy. Lungs:  Clear throughout to auscultation.   No wheezes, crackles, or rhonchi. No acute distress. Heart:  Regular rate and rhythm; no murmurs, clicks, rubs,  or gallops. Abdomen: Non-distended, normal bowel sounds.  Soft and nontender without appreciable mass or  hepatosplenomegaly.  Pulses:  Normal pulses noted. Extremities:  Without clubbing or edema.  Impression/Plan: Steven [name] is a 72 year old gentleman a myriad of GI not GI complaints.  He is frustrated with the status of his health at this time.  He is profoundly hypothyroid and is lost a good bit of weight in the past 3 months.  Recent abdominal pelvic CT reassuring.  Complaints of both constipation and diarrhea although more diarrhea recently.  GERD seems to be well-controlled.  Anorectal complaints are less now.  He has non-GI/constitutional symptoms.  Hypothyroidism may be impacting all of his symptoms.  Recommendations:  Continue protonix 40 mg daily  Keep appointments with Drs Gerarda Fraction and Nida  Fecal calprotectin and CRP  As discussed, we will be happy to send patient to the [facility] either [facility] or [facility] to further evaluate and treat  GI symptoms.  However,  thyroid status needs to improve;  ideally needs to be euthyroid in a steady state and then assess GI symptoms stand.  Office visit with Venetia Night in 3 months       Notice: This dictation was prepared with Dragon dictation along with smaller phrase technology. Any transcriptional errors that result from this process are unintentional and may not be corrected upon review.

## 2022-10-03 NOTE — Patient Instructions (Addendum)
Continue protonix 40 mg daily  Keep appointments with Drs Gerarda Fraction and Nida  Fecal calprotectin and CRP  As discussed, we will be happy to send you to the [facility] either [facility] or [place] to further evaluate and treat your GI symptoms.  However, your thyroid status needs to improve further to see where your GI symptoms stand.

## 2022-10-03 NOTE — Addendum Note (Signed)
Addended by: Orland Jarred on: 10/03/2022 12:41 PM   Modules accepted: Orders

## 2022-10-05 ENCOUNTER — Other Ambulatory Visit: Payer: Self-pay | Admitting: *Deleted

## 2022-10-05 DIAGNOSIS — G43709 Chronic migraine without aura, not intractable, without status migrainosus: Secondary | ICD-10-CM | POA: Diagnosis not present

## 2022-10-05 DIAGNOSIS — E0590 Thyrotoxicosis, unspecified without thyrotoxic crisis or storm: Secondary | ICD-10-CM | POA: Diagnosis not present

## 2022-10-05 DIAGNOSIS — R1032 Left lower quadrant pain: Secondary | ICD-10-CM

## 2022-10-05 DIAGNOSIS — R232 Flushing: Secondary | ICD-10-CM | POA: Diagnosis not present

## 2022-10-05 DIAGNOSIS — M4722 Other spondylosis with radiculopathy, cervical region: Secondary | ICD-10-CM | POA: Diagnosis not present

## 2022-10-05 DIAGNOSIS — I10 Essential (primary) hypertension: Secondary | ICD-10-CM | POA: Diagnosis not present

## 2022-10-05 DIAGNOSIS — I4891 Unspecified atrial fibrillation: Secondary | ICD-10-CM | POA: Diagnosis not present

## 2022-10-05 DIAGNOSIS — R110 Nausea: Secondary | ICD-10-CM

## 2022-10-05 DIAGNOSIS — I700 Atherosclerosis of aorta: Secondary | ICD-10-CM | POA: Diagnosis not present

## 2022-10-05 DIAGNOSIS — K581 Irritable bowel syndrome with constipation: Secondary | ICD-10-CM

## 2022-10-05 DIAGNOSIS — K529 Noninfective gastroenteritis and colitis, unspecified: Secondary | ICD-10-CM | POA: Diagnosis not present

## 2022-10-05 DIAGNOSIS — R109 Unspecified abdominal pain: Secondary | ICD-10-CM | POA: Diagnosis not present

## 2022-10-05 DIAGNOSIS — M5412 Radiculopathy, cervical region: Secondary | ICD-10-CM | POA: Diagnosis not present

## 2022-10-05 NOTE — Addendum Note (Signed)
Addended by: Madelin Rear on: 10/05/2022 01:29 PM   Modules accepted: Orders

## 2022-10-06 ENCOUNTER — Telehealth: Payer: Self-pay

## 2022-10-06 DIAGNOSIS — K581 Irritable bowel syndrome with constipation: Secondary | ICD-10-CM | POA: Diagnosis not present

## 2022-10-06 DIAGNOSIS — R110 Nausea: Secondary | ICD-10-CM | POA: Diagnosis not present

## 2022-10-06 DIAGNOSIS — R1032 Left lower quadrant pain: Secondary | ICD-10-CM | POA: Diagnosis not present

## 2022-10-06 DIAGNOSIS — E0590 Thyrotoxicosis, unspecified without thyrotoxic crisis or storm: Secondary | ICD-10-CM | POA: Diagnosis not present

## 2022-10-06 NOTE — Telephone Encounter (Signed)
     Patient  visit on 09/30/2022  at [facility] was for chest pain.  Have you been able to follow up with your primary care physician? Patient has followed up with his PCP and Cardiologist.  The patient was or was not able to obtain any needed medicine or equipment. No medication prescribed.  Are there diet recommendations that you are having difficulty following? No  Patient expresses understanding of discharge instructions and education provided has no other needs at this time. Yes   [place] Resource Care Guide   [email or web address]  ?? [email or web address]   Website: [email or web address]  [email or web address]

## 2022-10-07 LAB — T4, FREE: Free T4: 1.48 ng/dL (ref 0.82–1.77)

## 2022-10-07 LAB — T3, FREE: T3, Free: 3.2 pg/mL (ref 2.0–4.4)

## 2022-10-07 LAB — TSH: TSH: 0.119 u[IU]/mL — ABNORMAL LOW (ref 0.450–4.500)

## 2022-10-10 DIAGNOSIS — I10 Essential (primary) hypertension: Secondary | ICD-10-CM | POA: Diagnosis not present

## 2022-10-10 LAB — CALPROTECTIN, FECAL: Calprotectin, Fecal: 24 ug/g (ref 0–120)

## 2022-10-10 LAB — C-REACTIVE PROTEIN: CRP: <1 mg/L (ref 0–10)

## 2022-10-13 ENCOUNTER — Telehealth: Payer: Self-pay

## 2022-10-13 NOTE — Telephone Encounter (Signed)
Patient called advising that his pcp prescribed DULoxetine (CYMBALTA) 30 MG capsule Patient advised some of the side effects are decreased urine flow. He wanted to kno

## 2022-10-13 NOTE — Telephone Encounter (Signed)
He wanted to know if there is a medication that could help with the side effects.

## 2022-10-16 ENCOUNTER — Ambulatory Visit (INDEPENDENT_AMBULATORY_CARE_PROVIDER_SITE_OTHER): Payer: 59 | Admitting: "Endocrinology

## 2022-10-16 ENCOUNTER — Encounter: Payer: Self-pay | Admitting: "Endocrinology

## 2022-10-16 VITALS — BP 126/60 | HR 64 | Ht 67.0 in | Wt 181.2 lb

## 2022-10-16 DIAGNOSIS — E0590 Thyrotoxicosis, unspecified without thyrotoxic crisis or storm: Secondary | ICD-10-CM

## 2022-10-16 MED ORDER — METHIMAZOLE 10 MG PO TABS: 10.0000 mg | ORAL_TABLET | Freq: Every day | ORAL | 2 refills | Status: DC

## 2022-10-16 NOTE — Telephone Encounter (Signed)
Please see below and advise.

## 2022-10-16 NOTE — Progress Notes (Signed)
10/16/2022     Endocrinology follow-up note   Subjective:    Patient ID: Steven [name], male    DOB: 06-Jul-1951, PCP Redmond School, MD.   Past Medical History:  Diagnosis Date   Anxiety    Arthritis    Atrial fibrillation [facility])    Atrial flutter ([place])    BPH (benign prostatic hyperplasia)    Chest pain    Dizziness    Dyspnea    laying down occ   Fracture 08/17/2015   MULTIPLE RIB FRACTURES     FROM FALL    GERD (gastroesophageal reflux disease)    Hemorrhoids    History of kidney stones    noted on CT scan   History of radiation therapy 08/22/11-10/13/11   prostate   Hyperlipemia    Hypertension    Hyperthyroidism    IBS (irritable bowel syndrome)    Insomnia    Light headedness    Migraine    Numbness and tingling in left arm    Numbness and tingling of both legs    Open fracture of left elbow 08/18/2015   Prostate cancer [facility])    prostate s/p radiation Mar 2013   Rib fractures 08/17/2015   Tinnitus     Past Surgical History:  Procedure Laterality Date   BOTOX INJECTION N/A 01/14/2020   Procedure: INJECTION OF BOTOX INTO ANAL SPHINCTER;  Surgeon: Ileana Roup, MD;  Location: [facility] ORS;  Service: General;  Laterality: N/A;   COLONOSCOPY N/A 12/19/2012   [id number] 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 Normal appearing colon   COLONOSCOPY WITH PROPOFOL N/A 10/23/2019   Procedure: COLONOSCOPY WITH PROPOFOL;  Surgeon: Daneil Dolin, MD; external and grade 2 internal hemorrhoids, abnormal rectal blood vessels consistent with radiation proctitis s/p APC therapy, otherwise normal exam.   EVALUATION UNDER ANESTHESIA WITH ANAL FISTULECTOMY N/A 01/14/2020   Procedure: ANORECTAL EXAM UNDER ANESTHESIA;  Surgeon: Ileana Roup, MD;  Location: [facility] ORS;  Service: General;  Laterality: N/A;   HOT HEMOSTASIS  10/23/2019   Procedure: HOT HEMOSTASIS (ARGON PLASMA COAGULATION/BICAP);  Surgeon:  Daneil Dolin, MD;  Location: [facility] SUITE;  Service: Endoscopy;;  apc rectal proctitis     KIDNEY SURGERY  1982   kidney tube collapse repair   RADIOACTIVE SEED IMPLANT     Prostate    Social History   Socioeconomic History   Marital status: Single    Spouse name: Not on file   Number of children: 2   Years of education: Not on file   Highest education level: 12th grade  Occupational History   Occupation: Custodian     Employer: [employer]  Tobacco Use   Smoking status: Former    Packs/day: 1.00    Years: 20.00    Additional pack years: 0.00    Total pack years: 20.00    Types: Cigarettes    Quit date: 2020    Years since quitting: 4.2   Smokeless tobacco: Never   Tobacco comments:    Quit smoking x 2 years    07/2015   SOMETIIMES i USE VAPOR   Vaping Use   Vaping Use: Never used  Substance and Sexual Activity   Alcohol use: Never   Drug use: Never   Sexual activity: Not Currently  Other Topics Concern   Not on file  Social History Narrative   Lives with friend   Divorced   No  caffeine   Social Determinants of Health   Financial Resource Strain: Low Risk  (11/03/2020)   Overall Financial Resource Strain (CARDIA)    Difficulty of Paying Living Expenses: Not hard at all  Food Insecurity: No Food Insecurity (11/03/2020)   Hunger Vital Sign    Worried About Running Out of Food in the Last Year: Never true    Ran Out of Food in the Last Year: Never true  Transportation Needs: No Transportation Needs (11/03/2020)   PRAPARE - Hydrologist (Medical): No    Lack of Transportation (Non-Medical): No  Physical Activity: Inactive (11/03/2020)   Exercise Vital Sign    Days of Exercise per Week: 0 days    Minutes of Exercise per Session: 0 min  Stress: No Stress Concern Present (11/03/2020)   [place]    Feeling of Stress : Not at all  Social Connections:  Socially Isolated (11/03/2020)   Social Connection and Isolation Panel [NHANES]    Frequency of Communication with Friends and Family: Twice a week    Frequency of Social Gatherings with Friends and Family: Once a week    Attends Religious Services: Never    Marine scientist or Organizations: No    Attends Music therapist: Never    Marital Status: Divorced    Family History  Problem Relation Age of Onset   Aneurysm Mother        aortic   Hypertension Mother    Headache Mother    Prostate cancer Father    Hypertension Father    Colon cancer Neg Hx    Colon polyps Neg Hx     Outpatient Encounter Medications as of 10/16/2022  Medication Sig   acetaminophen (TYLENOL) 500 MG tablet Take 500 mg by mouth every 6 (six) hours as needed for moderate pain.   alfuzosin (UROXATRAL) 10 MG 24 hr tablet Take 1 tablet (10 mg total) by mouth daily with breakfast.   Cholecalciferol (VITAMIN D3) 125 MCG (5000 UT) CAPS Take 1 capsule by mouth daily.    DULoxetine (CYMBALTA) 30 MG capsule Take 30 mg by mouth daily.   ELIQUIS 5 MG TABS tablet TAKE 1 TABLET(5 MG) BY MOUTH TWICE DAILY (Patient taking differently: Take 5 mg by mouth 2 (two) times daily.)   gabapentin (NEURONTIN) 300 MG capsule Take 300 mg by mouth 3 (three) times daily as needed.   Galcanezumab-gnlm (EMGALITY) 120 MG/ML SOAJ Inject 1 Pen into the skin every 30 (thirty) days.   hydrochlorothiazide (HYDRODIURIL) 25 MG tablet Take 25 mg by mouth daily.   losartan (COZAAR) 50 MG tablet Take 1.5 tablets (75 mg total) by mouth daily.   methimazole (TAPAZOLE) 10 MG tablet Take 1 tablet (10 mg total) by mouth daily with breakfast.   metoprolol tartrate (LOPRESSOR) 25 MG tablet Take 1 tablet (25 mg total) by mouth 2 (two) times daily.   NEXLETOL 180 MG TABS Take 180 mg by mouth daily. Pt taking 2-3 times a week   ondansetron (ZOFRAN-ODT) 4 MG disintegrating tablet Take 1 tablet (4 mg total) by mouth every 8 (eight) hours as needed  for nausea or vomiting.   pantoprazole (PROTONIX) 40 MG tablet Take 1 tablet (40 mg total) by mouth daily.   polyethylene glycol (MIRALAX / GLYCOLAX) 17 g packet Take 17 g by mouth daily.   potassium chloride SA (KLOR-CON M) 20 MEQ tablet Take 1 tablet (20 mEq total) by mouth  2 (two) times daily. (Patient taking differently: Take 80 mEq by mouth daily.)   Probiotic Product (PROBIOTIC-10 PO) Take 1 capsule by mouth daily.   Ubrogepant (UBRELVY) 100 MG TABS Take 1 tablet (100 mg total) by mouth as needed (for headaches). May repeat a dose in 2 hours if needed. Max dose 2 pills in 24 hours   vitamin B-12 (CYANOCOBALAMIN) 100 MCG tablet Take 100 mcg by mouth daily.   [DISCONTINUED] methimazole (TAPAZOLE) 10 MG tablet Take 1 tablet (10 mg total) by mouth 2 (two) times daily.   No facility-administered encounter medications on file as of 10/16/2022.    ALLERGIES: Allergies  Allergen Reactions   Latex Rash    Possible reaction to latex gloves per patient   Meclizine     Other reaction(s): Abdominal Pain, Other    VACCINATION STATUS: Immunization History  Administered Date(s) Administered   Influenza,inj,quad, With Preservative 04/03/2019   Influenza-Unspecified 05/26/2020   Moderna Sars-Covid-2 Vaccination 09/07/2019, 10/12/2019     HPI  CAYTON DAFOE is 72 y.o. male who was previously seen in 2021 for subclinical hypothyroidism and mild hypocortisolemia.  His subsequent workup did not reveal adrenal dysfunction.  After appropriate workup, he was diagnosed with hypothyroidism, without elevated thyroid uptake and scan evidence.  He was put on methimazole 10 mg p.o. twice daily to which he is responding with improving thyroid function profile.  He has no new complaints today.  He still complains of fatigue, intermittent diaphoresis.  He has more stable body weight currently. he denies dysphagia, choking, shortness of breath, no recent voice change.    he denies  family history of thyroid  dysfunction. He  denies family hx of thyroid cancer. he denies personal history of goiter.                           Review of systems  Limited as above.   Objective:    BP 126/60   Pulse 64   Ht 5\' 7"  (1.702 m)   Wt 181 lb 3.2 oz (82.2 kg)   BMI 28.38 kg/m   Wt Readings from Last 3 Encounters:  10/16/22 181 lb 3.2 oz (82.2 kg)  10/03/22 184 lb 3.2 oz (83.6 kg)  09/30/22 186 lb (84.4 kg)       CMP     Component Value Date/Time   NA 136 09/30/2022 1101   NA 141 11/10/2019 1051   K 3.0 (L) 09/30/2022 1101   CL 103 09/30/2022 1101   CO2 23 09/30/2022 1101   GLUCOSE 114 (H) 09/30/2022 1101   BUN <5 (L) 09/30/2022 1101   BUN 8 04/16/2020 0000   CREATININE 0.80 09/30/2022 1101   CALCIUM 9.7 09/30/2022 1101   PROT 7.3 09/30/2022 1101   ALBUMIN 4.0 09/30/2022 1101   AST 21 09/30/2022 1101   ALT 16 09/30/2022 1101   ALKPHOS 51 09/30/2022 1101   BILITOT 0.9 09/30/2022 1101   GFRNONAA >60 09/30/2022 1101   GFRAA >60 02/19/2020 1622     CBC    Component Value Date/Time   WBC 3.1 (L) 09/30/2022 1101   RBC 4.70 09/30/2022 1101   HGB 13.9 09/30/2022 1101   HCT 40.7 09/30/2022 1101   PLT 196 09/30/2022 1101   MCV 86.6 09/30/2022 1101   MCH 29.6 09/30/2022 1101   MCHC 34.2 09/30/2022 1101   RDW 12.0 09/30/2022 1101   LYMPHSABS 1.4 09/30/2022 1101   MONOABS 0.3 09/30/2022 1101   EOSABS  0.1 09/30/2022 1101   BASOSABS 0.0 09/30/2022 1101   Lipid Panel     Component Value Date/Time   CHOL 142 04/16/2020 0000   TRIG 74 04/16/2020 0000   HDL 48 04/16/2020 0000   CHOLHDL 4.4 03/02/2019 0329   VLDL 18 03/02/2019 0329   LDLCALC 79 04/16/2020 0000     Lab Results  Component Value Date   TSH 0.119 (L) 10/06/2022   TSH 0.015 (L) 09/11/2022   TSH 0.011 (L) 09/08/2022   TSH 0.018 (L) 08/22/2022   TSH 0.030 (L) 08/13/2022   TSH 0.036 (L) 08/10/2022   TSH 0.271 (L) 08/01/2022   TSH 0.415 07/14/2021   TSH 3.630 07/14/2020   TSH 2.899 07/08/2020   FREET4 1.48  10/06/2022   FREET4 1.77 09/11/2022   FREET4 2.16 (H) 08/21/2022   FREET4 1.71 (H) 08/01/2022   FREET4 1.19 07/14/2020   FREET4 1.37 01/01/2020   FREET4 1.37 (H) 10/22/2019    Thyroid uptake and scan on Nov 28, 2019 FINDINGS: There is fairly homogeneous thyroid uptake. The left lobe extends more inferiorly than the right, although no focal hot or cold nodules are identified.   4 hour I-123 uptake = 4.1% (normal 5-20%)  24 hour I-123 uptake = 12.9% (normal 10-30%)   IMPRESSION: 1. Low normal radioactive iodine uptake by the thyroid gland. 2. No focal hot or cold nodules identified.    Assessment & Plan:   1. Hyperthyroidism- His previsit labs are consistent with treatment effect with improving thyroid function profile.  He is advised to lower methimazole to 10 mg p.o. once daily at breakfast.  His pulse rate is 64, he was already on beta-blocker-metoprolol 25 mg p.o. twice daily for treatment of hypertension. He will return to clinic for follow-up with repeat thyroid function tests in 3 months. His recent adrenal assessment is indicated for sufficient adrenal response including random cortisol and ACTH stimulation test ruling out adrenal insufficiency.  -Patient is advised to maintain close follow up with Redmond School, MD for primary care needs.  I spent  21 minutes in the care of the patient today including review of labs from Thyroid Function, CMP, and other relevant labs ; imaging/biopsy records (current and previous including abstractions from other facilities); face-to-face time discussing  his lab results and symptoms, medications doses, his options of short and long term treatment based on the latest standards of care / guidelines;   and documenting the encounter.  Steven [name]  participated in the discussions, expressed understanding, and voiced agreement with the above plans.  All questions were answered to his satisfaction. he is encouraged to contact clinic should he  have any questions or concerns prior to his return visit.   Follow up plan: Return in about 3 months (around 01/16/2023) for F/U with Pre-visit Labs.   Thank you for involving me in the care of this pleasant patient, and I will continue to update you with his progress.  Glade Lloyd, MD [facility] Phone: [phone]  Fax: [phone]   10/16/2022, 6:09 PM  This note was partially dictated with voice recognition software. Similar sounding words can be transcribed inadequately or may not  be corrected upon review.

## 2022-10-16 NOTE — Telephone Encounter (Signed)
Patient not urinating as much with a weak stream.  Not sure if it could be the new medication prescribed. DULoxetine (CYMBALTA) 30 MG capsule  Is there anything pt can do?

## 2022-10-17 ENCOUNTER — Telehealth: Payer: Self-pay | Admitting: Cardiovascular Disease

## 2022-10-17 ENCOUNTER — Ambulatory Visit (INDEPENDENT_AMBULATORY_CARE_PROVIDER_SITE_OTHER): Payer: 59 | Admitting: Urology

## 2022-10-17 DIAGNOSIS — R3912 Poor urinary stream: Secondary | ICD-10-CM | POA: Diagnosis not present

## 2022-10-17 DIAGNOSIS — R42 Dizziness and giddiness: Secondary | ICD-10-CM | POA: Diagnosis not present

## 2022-10-17 DIAGNOSIS — I10 Essential (primary) hypertension: Secondary | ICD-10-CM | POA: Diagnosis not present

## 2022-10-17 LAB — BLADDER SCAN AMB NON-IMAGING: Scan Result: 0

## 2022-10-17 NOTE — Telephone Encounter (Signed)
Patient called and states he will try to come this afternoon.  Patient will call back if he is able to come.

## 2022-10-17 NOTE — Progress Notes (Signed)
Patient in office for ua and pvr due to weak stream.  Urinalysis negative.  PVR =0 Will forward results to Dr. Alyson Ingles

## 2022-10-17 NOTE — Telephone Encounter (Signed)
Patient calling because he has two appt on 4/11, seeing if the appt is [place] is needed. Please advise

## 2022-10-17 NOTE — Telephone Encounter (Signed)
Pt c/o medication issue:  1. Name of Medication: metoprolol tartrate (LOPRESSOR) 25 MG tablet   2. How are you currently taking this medication (dosage and times per day)?   Take 1 tablet (25 mg total) by mouth 2 (two) times daily.    3. Are you having a reaction (difficulty breathing--STAT)? no  4. What is your medication issue? Patient states the medication is suppose to be for 50mg . Please advise

## 2022-10-17 NOTE — Telephone Encounter (Signed)
Spoke to pt who stated that ED had increased Lopressor to 50 mg BID the last time he was seen by them, however, there is no note that says medication was increased. Pt stated that he has been taking Lopressor 50 mg BID for the last month and is now out of medication. Pt needs a new RX called in for 50 mg BID.   Please advise.

## 2022-10-17 NOTE — Telephone Encounter (Signed)
Patient notified and verbalized the importance of keeping appointment with EP provider

## 2022-10-18 ENCOUNTER — Telehealth: Payer: Self-pay | Admitting: Psychiatry

## 2022-10-18 ENCOUNTER — Other Ambulatory Visit: Payer: Self-pay | Admitting: Cardiovascular Disease

## 2022-10-18 LAB — URINALYSIS, ROUTINE W REFLEX MICROSCOPIC
Bilirubin, UA: NEGATIVE
Glucose, UA: NEGATIVE
Ketones, UA: NEGATIVE
Leukocytes,UA: NEGATIVE
Nitrite, UA: NEGATIVE
Protein,UA: NEGATIVE
RBC, UA: NEGATIVE
Specific Gravity, UA: 1.015 (ref 1.005–1.030)
Urobilinogen, Ur: 1 mg/dL (ref 0.2–1.0)
pH, UA: 8.5 — ABNORMAL HIGH (ref 5.0–7.5)

## 2022-10-18 MED ORDER — METOPROLOL TARTRATE 50 MG PO TABS: 50.0000 mg | ORAL_TABLET | Freq: Two times a day (BID) | ORAL | 3 refills | Status: DC

## 2022-10-18 NOTE — Telephone Encounter (Signed)
Pt in office on 03/26.

## 2022-10-18 NOTE — Telephone Encounter (Signed)
Patient notified and verbalized understanding. Patient had no further questions or concerns at this time.  

## 2022-10-18 NOTE — Telephone Encounter (Signed)
Pt has been told by pharmacy that for his Galcanezumab-gnlm [facility]) 120 MG/ML SOAJ , a PA is needed

## 2022-10-18 NOTE — Telephone Encounter (Signed)
PA team please see the below  Please send replies to Pod 1

## 2022-10-18 NOTE — Telephone Encounter (Signed)
Prescription refill request for Eliquis received. Indication: PAF Last office visit: 09/14/22  Serita Butcher NP Scr: 0.80 on 09/30/22 Age: 72 Weight: 86kg  Based on above findings Eliquis 5mg  twice daily is the appropriate dose.  Refill approved.

## 2022-10-19 NOTE — Telephone Encounter (Signed)
Patient Advocate Encounter   Received notification that prior authorization for Emgality 120MG /ML auto-injectors (migraine) is required.   PA submitted on 10/19/2022 [place] [employer] Part D Electronic Prior Authorization Form Status is pending       Lyndel Safe, [place] Patient Advocate Specialist [place] Patient Advocate Team Direct Number: [phone]  Fax: [phone]

## 2022-10-23 ENCOUNTER — Encounter (HOSPITAL_COMMUNITY): Payer: Self-pay | Admitting: "Endocrinology

## 2022-10-23 ENCOUNTER — Other Ambulatory Visit (HOSPITAL_COMMUNITY): Payer: Self-pay

## 2022-10-23 NOTE — Telephone Encounter (Signed)
Pharmacy Patient Advocate Encounter  Prior Authorization for Emgality 120MG /ML auto-injectors (migraine) has been approved by [employer] (ins).    PA #  [id number] Effective dates: 10/19/2022 through 07/24/2023

## 2022-10-23 NOTE — Telephone Encounter (Signed)
Called and informed pt. Pt verbalized appreciation.  

## 2022-10-23 NOTE — Telephone Encounter (Signed)
Please call pt and let him know auth for [employer] approved by insurance. He should now be able to pick this up from his pharmacy.

## 2022-11-01 ENCOUNTER — Encounter (HOSPITAL_COMMUNITY): Payer: Self-pay | Admitting: Emergency Medicine

## 2022-11-01 ENCOUNTER — Other Ambulatory Visit: Payer: Self-pay

## 2022-11-01 ENCOUNTER — Emergency Department (HOSPITAL_COMMUNITY)
Admission: EM | Admit: 2022-11-01 | Discharge: 2022-11-01 | Disposition: A | Discharge status: Home / Self Care | Payer: 59 | Attending: Emergency Medicine | Admitting: Emergency Medicine

## 2022-11-01 ENCOUNTER — Ambulatory Visit: Payer: 59 | Admitting: "Endocrinology

## 2022-11-01 DIAGNOSIS — Z91040 Latex allergy status: Secondary | ICD-10-CM | POA: Diagnosis not present

## 2022-11-01 DIAGNOSIS — Z7901 Long term (current) use of anticoagulants: Secondary | ICD-10-CM | POA: Insufficient documentation

## 2022-11-01 DIAGNOSIS — I10 Essential (primary) hypertension: Secondary | ICD-10-CM | POA: Diagnosis not present

## 2022-11-01 DIAGNOSIS — Z79899 Other long term (current) drug therapy: Secondary | ICD-10-CM | POA: Diagnosis not present

## 2022-11-01 NOTE — ED Provider Triage Note (Signed)
Emergency Medicine Provider Triage Evaluation Note  Steven [name] , a 72 y.o. male  was evaluated in triage.  Pt complains of high blood pressure.  Patient reports that he has been having systolic blood pressure readings in the 180s for the last 1 day.  Patient reports compliance on losartan, HCTZ.  Patient reports that he is currently exploring other blood pressure treatment options with his PCP.  Patient reports he has appointment with PCP tomorrow.  Denies chest pain, shortness of breath, blurred vision or headache.  Review of Systems  Positive:  Negative:   Physical Exam  BP (!) 164/98   Pulse (!) 56   Temp 98.1 F (36.7 C) (Oral)   Resp 18   Wt 81.6 kg   SpO2 100%   BMI 28.19 kg/m  Gen:   Awake, no distress   Resp:  Normal effort  MSK:   Moves extremities without difficulty  Other:  Normal neuroexam  Medical Decision Making  Medically screening exam initiated at 2:55 PM.  Appropriate orders placed.  TOMMI PEERSON was informed that the remainder of the evaluation will be completed by another provider, this initial triage assessment does not replace that evaluation, and the importance of remaining in the ED until their evaluation is complete.     Al Decant, PA-C 11/01/22 1457

## 2022-11-01 NOTE — ED Triage Notes (Signed)
Pt states blood pressure has been high for two days. Has not missed any medication. 180/90 at home. Slight headache

## 2022-11-01 NOTE — ED Provider Notes (Signed)
[place] [facility] AT [facility] Provider Note   CSN: [id number] Arrival date & time: 11/01/22  1404     History  Chief Complaint  Patient presents with   Hypertension    Steven [name] is a 72 y.o. male.  Patient has a history of hypertension.  He states his blood pressure has been running high his systolic has been running around 170-180 with a diastolic in the high 90s.  He is on 3 blood pressure medicines Lopressor Cozaar and HCTZ he has an appointment tomorrow with his doctor  The history is provided by the patient and medical records. No language interpreter was used.  Hypertension This is a new problem. The current episode started 12 to 24 hours ago. The problem occurs constantly. The problem has not changed since onset.Pertinent negatives include no chest pain, no abdominal pain and no headaches. Nothing aggravates the symptoms. He has tried nothing for the symptoms.       Home Medications Prior to Admission medications   Medication Sig Start Date End Date Taking? Authorizing Provider  acetaminophen (TYLENOL) 500 MG tablet Take 500 mg by mouth every 6 (six) hours as needed for moderate pain.    [provider]  alfuzosin (UROXATRAL) 10 MG 24 hr tablet Take 1 tablet (10 mg total) by mouth daily with breakfast. 04/07/22   [name], Mardene CelestePatrick L, MD  Cholecalciferol (VITAMIN D3) 125 MCG (5000 UT) CAPS Take 1 capsule by mouth daily.     [provider]  DULoxetine (CYMBALTA) 30 MG capsule Take 30 mg by mouth daily.    [provider]  ELIQUIS 5 MG TABS tablet TAKE 1 TABLET(5 MG) BY MOUTH TWICE DAILY 10/18/22   Wendall StadeNishan, Peter C, MD  gabapentin (NEURONTIN) 300 MG capsule Take 300 mg by mouth 3 (three) times daily as needed. 02/24/20   [provider]  Galcanezumab-gnlm (EMGALITY) 120 MG/ML SOAJ Inject 1 Pen into the skin every 30 (thirty) days. 09/25/22   Ocie Doynehima, Jennifer, MD  hydrochlorothiazide (HYDRODIURIL) 25 MG tablet Take 25  mg by mouth daily. 07/28/19   [provider]  losartan (COZAAR) 50 MG tablet Take 1.5 tablets (75 mg total) by mouth daily. 08/23/22   Vassie LollMadera, Carlos, MD  methimazole (TAPAZOLE) 10 MG tablet Take 1 tablet (10 mg total) by mouth daily with breakfast. 10/16/22   Nida, Denman GeorgeGebreselassie W, MD  metoprolol tartrate (LOPRESSOR) 50 MG tablet Take 1 tablet (50 mg total) by mouth 2 (two) times daily. 10/18/22 10/13/23  Wendall StadeNishan, Peter C, MD  NEXLETOL 180 MG TABS Take 180 mg by mouth daily. Pt taking 2-3 times a week 12/03/19   [provider]  ondansetron (ZOFRAN-ODT) 4 MG disintegrating tablet Take 1 tablet (4 mg total) by mouth every 8 (eight) hours as needed for nausea or vomiting. 08/23/22   Vassie LollMadera, Carlos, MD  pantoprazole (PROTONIX) 40 MG tablet Take 1 tablet (40 mg total) by mouth daily. 08/23/22   Vassie LollMadera, Carlos, MD  polyethylene glycol (MIRALAX / GLYCOLAX) 17 g packet Take 17 g by mouth daily. 08/23/22   Vassie LollMadera, Carlos, MD  potassium chloride SA (KLOR-CON M) 20 MEQ tablet Take 1 tablet (20 mEq total) by mouth 2 (two) times daily. Patient taking differently: Take 80 mEq by mouth daily. 08/23/22   Vassie LollMadera, Carlos, MD  Probiotic Product (PROBIOTIC-10 PO) Take 1 capsule by mouth daily.    [provider]  Ubrogepant (UBRELVY) 100 MG TABS Take 1 tablet (100 mg total) by mouth as needed (for  headaches). May repeat a dose in 2 hours if needed. Max dose 2 pills in 24 hours 09/25/22   Ocie Doyne, MD  vitamin B-12 (CYANOCOBALAMIN) 100 MCG tablet Take 100 mcg by mouth daily.    [provider]      Allergies    Latex and Meclizine    Review of Systems   Review of Systems  Constitutional:  Negative for appetite change and fatigue.  HENT:  Negative for congestion, ear discharge and sinus pressure.   Eyes:  Negative for discharge.  Respiratory:  Negative for cough.   Cardiovascular:  Negative for chest pain.  Gastrointestinal:  Negative for abdominal pain and diarrhea.   Genitourinary:  Negative for frequency and hematuria.  Musculoskeletal:  Negative for back pain.  Skin:  Negative for rash.  Neurological:  Negative for seizures and headaches.  Psychiatric/Behavioral:  Negative for hallucinations.     Physical Exam Updated Vital Signs BP (!) 164/98   Pulse (!) 56   Temp 98.1 F (36.7 C) (Oral)   Resp 18   Wt 81.6 kg   SpO2 100%   BMI 28.19 kg/m  Physical Exam Vitals and nursing note reviewed.  Constitutional:      Appearance: He is well-developed.  HENT:     Head: Normocephalic.     Nose: Nose normal.  Eyes:     General: No scleral icterus.    Conjunctiva/sclera: Conjunctivae normal.  Neck:     Thyroid: No thyromegaly.  Cardiovascular:     Rate and Rhythm: Normal rate and regular rhythm.     Heart sounds: No murmur heard.    No friction rub. No gallop.  Pulmonary:     Breath sounds: No stridor. No wheezing or rales.  Chest:     Chest wall: No tenderness.  Abdominal:     General: There is no distension.     Tenderness: There is no abdominal tenderness. There is no rebound.  Musculoskeletal:        General: Normal range of motion.     Cervical back: Neck supple.  Lymphadenopathy:     Cervical: No cervical adenopathy.  Skin:    Findings: No erythema or rash.  Neurological:     Mental Status: He is alert and oriented to person, place, and time.     Motor: No abnormal muscle tone.     Coordination: Coordination normal.  Psychiatric:        Behavior: Behavior normal.     ED Results / Procedures / Treatments   Labs (all labs ordered are listed, but only abnormal results are displayed) Labs Reviewed - No data to display  EKG None  Radiology No results found.  Procedures Procedures    Medications Ordered in ED Medications - No data to display  ED Course/ Medical Decision Making/ A&P                             Medical Decision Making  Patient with moderately elevated blood pressure and on 3 blood pressure  medicines we will increase his Cozaar from 75 mg a day to 100 mg a day but he is to see his primary care doctor tomorrow anyway        Final Clinical Impression(s) / ED Diagnoses Final diagnoses:  Primary hypertension    Rx / DC Orders ED Discharge Orders     None         Bethann Berkshire, MD 11/03/22 1254

## 2022-11-01 NOTE — Discharge Instructions (Signed)
Keep taking your losartan so you are taking 100 mg a day and follow-up with your doctor tomorrow as planned

## 2022-11-02 ENCOUNTER — Encounter: Payer: Self-pay | Admitting: Student

## 2022-11-02 ENCOUNTER — Ambulatory Visit: Payer: 59 | Admitting: Internal Medicine

## 2022-11-02 ENCOUNTER — Ambulatory Visit: Payer: 59 | Attending: Student | Admitting: Student

## 2022-11-02 VITALS — BP 126/68 | HR 71 | Ht 67.0 in | Wt 183.0 lb

## 2022-11-02 DIAGNOSIS — I471 Supraventricular tachycardia, unspecified: Secondary | ICD-10-CM | POA: Diagnosis not present

## 2022-11-02 DIAGNOSIS — I10 Essential (primary) hypertension: Secondary | ICD-10-CM

## 2022-11-02 DIAGNOSIS — I4891 Unspecified atrial fibrillation: Secondary | ICD-10-CM | POA: Diagnosis not present

## 2022-11-02 DIAGNOSIS — R0789 Other chest pain: Secondary | ICD-10-CM | POA: Diagnosis not present

## 2022-11-02 DIAGNOSIS — I480 Paroxysmal atrial fibrillation: Secondary | ICD-10-CM

## 2022-11-02 DIAGNOSIS — I700 Atherosclerosis of aorta: Secondary | ICD-10-CM | POA: Diagnosis not present

## 2022-11-02 DIAGNOSIS — E785 Hyperlipidemia, unspecified: Secondary | ICD-10-CM | POA: Diagnosis not present

## 2022-11-02 NOTE — Progress Notes (Signed)
Cardiology Office Note    Date:  11/02/2022  ID:  Steven [name], DOB 28-Jun-1951, MRN [id number] Cardiologist: Charlton Haws, MD    History of Present Illness:    Steven [name] is a 72 y.o. male with past medical history of paroxysmal atrial fibrillation/flutter, SVT, hyperthyroidism, HTN, HLD and IBS who presents to the office today for 6-week follow-up.  He was examined by Nada Boozer, NP in 08/2022 and reported still having intermittent episodes of chest discomfort which was felt to be secondary to his atrial fibrillation as he experienced palpitations at that time. It had been recommended to pursue a repeat Lexiscan Myoview and this was scheduled. He also had a monitor in place and was recommend to follow-up on results once available. His stress test in 08/2022 showed no evidence of ischemia and was a low-risk study. Event monitor showed predominantly normal sinus rhythm with rare PAC's and PVC's with 1 episode of SVT lasting for more than 1 minute. By review of the progress note from 08/2022 there was an addendum as Vernona Rieger had reviewed his case with Dr. Eden Emms and given his frequent ER visits and anxiety, it was recommended to consider a cardiac catheterization with Dr. Lynnette Caffey to do a vasoactive study to assess for small vessel disease but did not want to perform this with iodinated contrast until his thyroid had normalized.  He did present to [facility] ED on 11/01/2022 for evaluation of elevated BP as his SBP had been in the 170's to 180's. He was taking Losartan 75 mg daily and this was increased to 100 mg daily.  In talking with the patient today, he reports a variety of issues including having neuropathy along his feet, changes in dietary intake and decreased appetite, nausea, and intermittent dizziness which has been occurring for several months. Reports Gabapentin was recently adjusted by his PCP and he is hopeful this will help with his symptoms. His blood pressure was elevated yesterday  but has been well-controlled today and is at 126/68 during today's visit. He reports feeling more fatigued when BP is well-controlled. He reports his chest discomfort has mostly resolved at this point. Denies any specific orthopnea, PND or pitting edema. He does continue to experience intermittent palpitations but says they are overall brief and typically occur at night.   Studies Reviewed:   EKG: EKG is not ordered today. EKG from 09/30/2022 is reviewed and demonstrates normal sinus rhythm, heart rate 78 with slight T wave inversion along the inferior leads which is similar to prior tracings.  Echocardiogram: 07/2022 IMPRESSIONS     1. Left ventricular ejection fraction, by estimation, is 60 to 65%. The  left ventricle has normal function. The left ventricle has no regional  wall motion abnormalities. Left ventricular diastolic parameters are  consistent with Grade I diastolic  dysfunction (impaired relaxation).   2. Right ventricular systolic function is normal. The right ventricular  size is normal. Tricuspid regurgitation signal is inadequate for assessing  PA pressure.   3. Left atrial size was mildly dilated.   4. The mitral valve is normal in structure. No evidence of mitral valve  regurgitation. No evidence of mitral stenosis.   5. The aortic valve is tricuspid. There is moderate calcification of the  aortic valve. Aortic valve regurgitation is not visualized. No aortic  stenosis is present.   6. The inferior vena cava is normal in size with greater than 50%  respiratory variability, suggesting right atrial pressure of 3 mmHg.   Comparison(s):  No significant change from prior study.   NST: 08/2022   Stress ECG is negative for ischemia.   LV perfusion is normal. There is no evidence of ischemia. There is no evidence of infarction.   Left ventricular function is normal. Nuclear stress EF: 78 %.   The study is normal. The study is low risk.  Event Monitor:  08/2022 NSR PAC/PVC One episode of SVT lasting > 1 minute HR > 200 auto-triggerred no symptoms NSR with no arrhythmias during symptoms of dizziness and palpitations    Physical Exam:   VS:  BP 126/68   Pulse 71   Ht 5\' 7"  (1.702 m)   Wt 183 lb (83 kg)   SpO2 97%   BMI 28.66 kg/m    Wt Readings from Last 3 Encounters:  11/02/22 183 lb (83 kg)  11/01/22 180 lb (81.6 kg)  10/16/22 181 lb 3.2 oz (82.2 kg)     GEN: Well nourished, well developed male appearing in no acute distress NECK: No JVD; No carotid bruits CARDIAC: RRR, no murmurs, rubs, gallops RESPIRATORY:  Clear to auscultation without rales, wheezing or rhonchi  ABDOMEN: Appears non-distended. No obvious abdominal masses. EXTREMITIES: No clubbing or cyanosis. No pitting edema.  Distal pedal pulses are 2+ bilaterally.   Assessment and Plan:   1. Paroxysmal Atrial Fibrillation/Flutter - He reports brief palpitations as described above but no persistent symptoms. He is maintaining normal sinus rhythm by examination today and heart rate is well-controlled in the 70's. Continue Lopressor 50 mg twice daily for rate-control. - No reports of active bleeding. He remains on Eliquis 5 mg twice daily for anticoagulation which is the appropriate dose at this time given his age, weight and renal function. Recent labs in 09/2022 showed his hemoglobin was stable at 13.9 with platelets at 196 K.   2. SVT - Noted during admission in 07/2022 and spontaneously converted back to NSR with belching.  Reports occasional palpitations but overall brief. Continue Lopressor 50 mg twice daily. He was previously referred to EP and has scheduled follow-up for later this month. - His hyperthyroidism could be contributing as well. This has improved but TSH was still low at 0.119 by recent labs last month.   3. History of Chest Pain - Recent NST in 08/2022 showed no evidence of ischemia and was a low-risk study. By review of his last office note, his  case was discussed with Dr. Eden Emms and given his frequent ER visits, it was recommended to consider a cardiac catheterization with Dr. Lynnette Caffey to do a vasoactive study to assess for small vessel disease but did not want to perform this with iodinated contrast until his thyroid had normalized. - At this time, he denies any exertional chest pain and given his recent low-risk NST, will continue with risk factor modification. He is not on ASA given the need for anticoagulation.  Remains on Lopressor 50 mg twice daily and Nexletol 180 mg daily.  4. HTN - His blood pressure is well-controlled at 126/68 during today's visit. Losartan was just titrated to 100 mg daily earlier this week. Will continue current medical therapy with HCTZ 25 mg daily, Losartan 100 mg daily and Lopressor 50 mg twice daily.  5. HLD - LDL was at 119 in 2023. On Nexletol 180mg  daily as he was previously intolerant to statin therapy. He is not interested in injectable options at this time.  Signed, Ellsworth Lennox, PA-C

## 2022-11-02 NOTE — Patient Instructions (Signed)
Medication Instructions:  Your physician recommends that you continue on your current medications as directed. Please refer to the Current Medication list given to you today.  *If you need a refill on your cardiac medications before your next appointment, please call your pharmacy*   Lab Work: NONE   If you have labs (blood work) drawn today and your tests are completely normal, you will receive your results only by: MyChart Message (if you have MyChart) OR A paper copy in the mail If you have any lab test that is abnormal or we need to change your treatment, we will call you to review the results.   Testing/Procedures: NONE    Follow-Up: At [facility], you and your health needs are our priority.  As part of our continuing mission to provide you with exceptional heart care, we have created designated Provider Care Teams.  These Care Teams include your primary Cardiologist (physician) and Advanced Practice Providers (APPs -  Physician Assistants and Nurse Practitioners) who all work together to provide you with the care you need, when you need it.  We recommend signing up for the patient portal called "MyChart".  Sign up information is provided on this After Visit Summary.  MyChart is used to connect with patients for Virtual Visits (Telemedicine).  Patients are able to view lab/test results, encounter notes, upcoming appointments, etc.  Non-urgent messages can be sent to your provider as well.   To learn more about what you can do with MyChart, go to [email or web address].    Your next appointment:    May   Provider:   Charlton Haws, MD    Other Instructions Thank you for choosing [facility]!

## 2022-11-06 ENCOUNTER — Telehealth: Payer: Self-pay

## 2022-11-06 NOTE — Telephone Encounter (Signed)
        Patient  visited [facility] on 11/01/2022  for hypertension.   Telephone encounter attempt :  1st  A HIPAA compliant voice message was left requesting a return call.  Instructed patient to call back at [phone].   [facility] Minor [facility]  [facility] Population Health Community Resource Care Guide   [email or web address]  ?? [phone]   Website: [email or web address]  [email or web address]

## 2022-11-07 ENCOUNTER — Ambulatory Visit: Payer: 59 | Attending: Internal Medicine | Admitting: Internal Medicine

## 2022-11-07 ENCOUNTER — Telehealth: Payer: Self-pay

## 2022-11-07 ENCOUNTER — Encounter: Payer: Self-pay | Admitting: Internal Medicine

## 2022-11-07 VITALS — BP 122/70 | HR 57 | Ht 67.0 in | Wt 183.0 lb

## 2022-11-07 DIAGNOSIS — I4892 Unspecified atrial flutter: Secondary | ICD-10-CM

## 2022-11-07 DIAGNOSIS — I480 Paroxysmal atrial fibrillation: Secondary | ICD-10-CM | POA: Diagnosis not present

## 2022-11-07 DIAGNOSIS — I482 Chronic atrial fibrillation, unspecified: Secondary | ICD-10-CM

## 2022-11-07 DIAGNOSIS — I471 Supraventricular tachycardia, unspecified: Secondary | ICD-10-CM

## 2022-11-07 MED ORDER — FUROSEMIDE 20 MG PO TABS: 20.0000 mg | ORAL_TABLET | Freq: Every day | ORAL | 3 refills | Status: DC

## 2022-11-07 NOTE — Progress Notes (Signed)
HPI [name] is referred for evaluation of SVT. He is a pleasant 72 yo man with HTN, and some degree of orthostasis. He has had multiple ED visits for SVT and has worn a cardiac monitor which demonstrated SVT at rates of up to 218/min. He denies chest pain. He has not had syncope. When he goes into SVT, then feels sob. He also has a h/o PAF but it is unclear if he has SVT induced by atrial fib. He has been on a beta blocker but his HR is on the low side and his bp has been a little high. Allergies  Allergen Reactions   Latex Rash    Possible reaction to latex gloves per patient   Meclizine     Other reaction(s): Abdominal Pain, Other     Current Outpatient Medications  Medication Sig Dispense Refill   acetaminophen (TYLENOL) 500 MG tablet Take 500 mg by mouth every 6 (six) hours as needed for moderate pain.     alfuzosin (UROXATRAL) 10 MG 24 hr tablet Take 1 tablet (10 mg total) by mouth daily with breakfast. 90 tablet 3   Cholecalciferol (VITAMIN D3) 125 MCG (5000 UT) CAPS Take 1 capsule by mouth daily.      DULoxetine (CYMBALTA) 30 MG capsule Take 30 mg by mouth daily.     ELIQUIS 5 MG TABS tablet TAKE 1 TABLET(5 MG) BY MOUTH TWICE DAILY 60 tablet 5   gabapentin (NEURONTIN) 300 MG capsule Take 300 mg by mouth 3 (three) times daily as needed.     Galcanezumab-gnlm (EMGALITY) 120 MG/ML SOAJ Inject 1 Pen into the skin every 30 (thirty) days. 1.12 mL 7   hydrochlorothiazide (HYDRODIURIL) 25 MG tablet Take 25 mg by mouth daily.     losartan (COZAAR) 100 MG tablet Take 100 mg by mouth daily.     methimazole (TAPAZOLE) 10 MG tablet Take 1 tablet (10 mg total) by mouth daily with breakfast. 30 tablet 2   metoprolol tartrate (LOPRESSOR) 50 MG tablet Take 1 tablet (50 mg total) by mouth 2 (two) times daily. 180 tablet 3   NEXLETOL 180 MG TABS Take 180 mg by mouth daily. Pt taking 2-3 times a week     ondansetron (ZOFRAN-ODT) 4 MG disintegrating tablet Take 1 tablet (4 mg total) by mouth  every 8 (eight) hours as needed for nausea or vomiting. 20 tablet 1   pantoprazole (PROTONIX) 40 MG tablet Take 1 tablet (40 mg total) by mouth daily. 30 tablet 1   polyethylene glycol (MIRALAX / GLYCOLAX) 17 g packet Take 17 g by mouth daily. 30 each 2   potassium chloride SA (KLOR-CON M) 20 MEQ tablet Take 1 tablet (20 mEq total) by mouth 2 (two) times daily. (Patient taking differently: Take 80 mEq by mouth daily.)     Probiotic Product (PROBIOTIC-10 PO) Take 1 capsule by mouth daily.     Ubrogepant (UBRELVY) 100 MG TABS Take 1 tablet (100 mg total) by mouth as needed (for headaches). May repeat a dose in 2 hours if needed. Max dose 2 pills in 24 hours 16 tablet 6   vitamin B-12 (CYANOCOBALAMIN) 100 MCG tablet Take 100 mcg by mouth daily.     No current facility-administered medications for this visit.     Past Medical History:  Diagnosis Date   Anxiety    Arthritis    Atrial fibrillation    Atrial flutter    BPH (benign prostatic hyperplasia)    Chest pain  Dizziness    Dyspnea    laying down occ   Fracture 08/17/2015   MULTIPLE RIB FRACTURES     FROM FALL    GERD (gastroesophageal reflux disease)    Hemorrhoids    History of kidney stones    noted on CT scan   History of radiation therapy 08/22/11-10/13/11   prostate   Hyperlipemia    Hypertension    Hyperthyroidism    IBS (irritable bowel syndrome)    Insomnia    Light headedness    Migraine    Numbness and tingling in left arm    Numbness and tingling of both legs    Open fracture of left elbow 08/18/2015   Prostate cancer    prostate s/p radiation Mar 2013   Rib fractures 08/17/2015   Tinnitus     ROS:   All systems reviewed and negative except as noted in the HPI.   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 Normal appearing colon   COLONOSCOPY WITH PROPOFOL N/A 10/23/2019   Procedure: COLONOSCOPY WITH PROPOFOL;  Surgeon: Corbin Ade, MD; external and grade 2 internal hemorrhoids, abnormal rectal blood vessels consistent with radiation proctitis s/p APC therapy, otherwise normal exam.   EVALUATION UNDER ANESTHESIA WITH ANAL FISTULECTOMY N/A 01/14/2020   Procedure: ANORECTAL EXAM UNDER ANESTHESIA;  Surgeon: Andria Meuse, MD;  Location: [facility];  Service: General;  Laterality: N/A;   HOT HEMOSTASIS  10/23/2019   Procedure: HOT HEMOSTASIS (ARGON PLASMA COAGULATION/BICAP);  Surgeon: Corbin Ade, MD;  Location: [facility];  Service: Endoscopy;;  apc rectal proctitis     KIDNEY SURGERY  1982   kidney tube collapse repair   RADIOACTIVE SEED IMPLANT     Prostate     Family History  Problem Relation Age of Onset   Aneurysm Mother        aortic   Hypertension Mother    Headache Mother    Prostate cancer Father    Hypertension Father    Colon cancer Neg Hx    Colon polyps Neg Hx      Social History   Socioeconomic History   Marital status: Single    Spouse name: Not on file   Number of children: 2   Years of education: Not on file   Highest education level: 12th grade  Occupational History   Occupation: Custodian     Employer: [employer]  Tobacco Use   Smoking status: Former    Packs/day: 1.00    Years: 20.00    Additional pack years: 0.00    Total pack years: 20.00    Types: Cigarettes    Quit date: 2020    Years since quitting: 4.2   Smokeless tobacco: Never   Tobacco comments:    Quit smoking x 2 years    07/2015   SOMETIIMES i USE VAPOR   Vaping Use   Vaping Use: Never used  Substance and Sexual Activity   Alcohol use: Never   Drug use: Never   Sexual activity: Not Currently  Other Topics Concern   Not on file  Social  History Narrative   Lives with friend   Divorced  No caffeine   Social Determinants of Health   Financial Resource Strain: Low Risk  (11/03/2020)   Overall Financial Resource Strain (CARDIA)    Difficulty of Paying Living Expenses: Not hard at all  Food Insecurity: No Food Insecurity (11/03/2020)   Hunger Vital Sign    Worried About Running Out of Food in the Last Year: Never true    Ran Out of Food in the Last Year: Never true  Transportation Needs: No Transportation Needs (11/03/2020)   PRAPARE - Administrator, Civil Service (Medical): No    Lack of Transportation (Non-Medical): No  Physical Activity: Inactive (11/03/2020)   Exercise Vital Sign    Days of Exercise per Week: 0 days    Minutes of Exercise per Session: 0 min  Stress: No Stress Concern Present (11/03/2020)   [employer] of Occupational Health - Occupational Stress Questionnaire    Feeling of Stress : Not at all  Social Connections: Socially Isolated (11/03/2020)   Social Connection and Isolation Panel [NHANES]    Frequency of Communication with Friends and Family: Twice a week    Frequency of Social Gatherings with Friends and Family: Once a week    Attends Religious Services: Never    Database administrator or Organizations: No    Attends Banker Meetings: Never    Marital Status: Divorced  Catering manager Violence: Not At Risk (11/03/2020)   Humiliation, Afraid, Rape, and Kick questionnaire    Fear of Current or Ex-Partner: No    Emotionally Abused: No    Physically Abused: No    Sexually Abused: No     BP 122/70   Pulse (!) 57   Ht  (1.702 m)   Wt 183 lb (83 kg)   SpO2 97%   BMI 28.66 kg/m   Physical Exam:  Well appearing NAD HEENT: Unremarkable Neck:  No JVD, no thyromegally Lymphatics:  No adenopathy Back:  No CVA tenderness Lungs:  Clear HEART:  Regular rate rhythm, no murmurs, no rubs, no clicks Abd:  soft, positive bowel sounds, no organomegally, no  rebound, no guarding Ext:  2 plus pulses, no edema, no cyanosis, no clubbing Skin:  No rashes no nodules Neuro:  CN II through XII intact, motor grossly intact  EKG - nsr  Assess/Plan: SVT - I have discussed the treatment options with the patient and have offered him SVT ablation and he will call us if he wishes to proceed. PAF - I suspect that this is related to his SVT. We might consider stopping this if he chooses to have the ablation and does not have more atrial fib. HTN - his bp is labile, especially at night. He had breakthrough elevation at times. I would recommend reducing the toprol and switching to coreg if he chooses.Marland Kitchen

## 2022-11-07 NOTE — Telephone Encounter (Signed)
     Patient  visit on 11/01/2022  at [facility] was for hypertension.  Have you been able to follow up with your primary care physician? Patient followed-up with Cardiologist.  The patient was or was not able to obtain any needed medicine or equipment. No medication prescribed.  Are there diet recommendations that you are having difficulty following? No  Patient expresses understanding of discharge instructions and education provided has no other needs at this time. Yes   Jeanie Mccard [facility]  [facility] Population Health Community Resource Care Guide   [email or web address]  ?? [phone]   Website: [email or web address]  [email or web address]

## 2022-11-07 NOTE — Patient Instructions (Addendum)
Medication Instructions:  Your physician has recommended you make the following change in your medication: STOP TAKING: hydrochlorothiazide TODAY, 11/07/2022;   START TAKING: LASIX 20 mg, Daily  You will-  Take 1 tablet (20 mg total) by mouth daily. Take with or after meal.    Lab Work: You will need to have a CBC and BMET drawn within 30 days of your procedure.  We will schedule this day / time when you call us.  If you have labs (blood work) drawn today and your tests are completely normal, you will receive your results only by: MyChart Message (if you have MyChart) OR A paper copy in the mail If you have any lab test that is abnormal or we need to change your treatment, we will call you to review the results.  Testing/Procedures: None ordered.  Follow-Up: Dr. Lewayne Bunting has ordered an SVT Ablation with [facility] only.  Please see the Ablation procedure days listed below.    May 21  June 10, 12, 17, 26, 28  Please select a month and day, and call [facility] at [phone], we will schedule your procedure with Dr. Ladona Ridgel and explain the next steps.     The format for your next appointment:   In Person  Provider:   Lewayne Bunting, MD{or one of the following Advanced Practice Providers on your designated Care Team:   Francis Dowse, [place] Casimiro Needle "Mardelle Matte" Lanna Poche, [place]  Cardiac Ablation Cardiac ablation is a procedure to destroy, or ablate, a small amount of heart tissue that is causing problems. The heart has many electrical connections. Sometimes, these connections are abnormal and can cause the heart to beat very fast or irregularly. Ablating the abnormal areas can improve the heart's rhythm or return it to normal. Ablation may be done for people who: Have irregular or rapid heartbeats (arrhythmias). Have Wolff-Parkinson-White syndrome. Have taken medicines for an arrhythmia that did not work or caused side effects. Have a high-risk heartbeat that may be life-threatening. Tell a  health care provider about: Any allergies you have. All medicines you are taking, including vitamins, herbs, eye drops, creams, and over-the-counter medicines. Any problems you or family members have had with anesthesia. Any bleeding problems you have. Any surgeries you have had. Any medical conditions you have. Whether you are pregnant or may be pregnant. What are the risks? Your health care provider will talk with you about risks. These may include: Infection. Bruising and bleeding. Stroke or blood clots. Damage to nearby structures or organs. Allergic reaction to medicines or dyes. Needing a pacemaker if the heart gets damaged. A pacemaker is a device that helps the heart beat normally. Failure of the procedure. A repeat procedure may be needed. What happens before the procedure? Medicines Ask your health care provider about: Changing or stopping your regular medicines. These include any heart rhythm medicines, diabetes medicines, or blood thinners you take. Taking medicines such as aspirin and ibuprofen. These medicines can thin your blood. Do not take them unless your health care provider tells you to. Taking over-the-counter medicines, vitamins, herbs, and supplements. General instructions Follow instructions from your health care provider about what you may eat and drink. If you will be going home right after the procedure, plan to have a responsible adult: Take you home from the hospital or clinic. You will not be allowed to drive. Care for you for the time you are told. Ask your health care provider what steps will be taken to prevent infection. What happens during the procedure?  An IV will be inserted into one of your veins. You may be given: A sedative. This helps you relax. Anesthesia. This will: Numb certain areas of your body. An incision will be made in your neck or your groin. A needle will be inserted through the incision and into a large vein in your neck or  groin. The small, thin tube (catheter) will be inserted through the needle and moved to your heart. A type of X-ray (fluoroscopy) will be used to help guide the catheter and provide images of the heart on a monitor. Dye may be injected through the catheter to help your surgeon see the area of the heart that needs treatment. Electrical currents will be sent from the catheter to destroy heart tissue in certain areas. There are three types of energy that may be used to do this: Heat (radiofrequency energy). Laser energy. Extreme cold (cryoablation). When the tissue has been destroyed, the catheter will be removed. Pressure will be held on the insertion area to prevent bleeding. A bandage (dressing) will be placed over the insertion area. The procedure may vary among health care providers and hospitals. What happens after the procedure? Your blood pressure, heart rate and rhythm, breathing rate, and blood oxygen level will be monitored until you leave the hospital or clinic. Your insertion area will be checked for bleeding. You will need to lie still for a few hours. If your groin was used, you will need to keep your leg straight for a few hours after the catheter is removed. This information is not intended to replace advice given to you by your health care provider. Make sure you discuss any questions you have with your health care provider. Document Revised: 12/27/2021 Document Reviewed: 12/27/2021 Elsevier Patient Education  2023 [employer].

## 2022-11-09 ENCOUNTER — Encounter (HOSPITAL_COMMUNITY): Payer: Self-pay | Admitting: "Endocrinology

## 2022-11-10 ENCOUNTER — Telehealth: Payer: Self-pay | Admitting: Cardiovascular Disease

## 2022-11-10 DIAGNOSIS — Z01812 Encounter for preprocedural laboratory examination: Secondary | ICD-10-CM

## 2022-11-10 NOTE — Telephone Encounter (Signed)
Called Pt back per message received from RN.    Pt did not answer my call to schedule his SVT Ablation with Dr. Ladona Ridgel.  Voicemail message left; I will schedule once he shares the month and day he chose.

## 2022-11-10 NOTE — Telephone Encounter (Signed)
Forward to N.Young,RN  Refer to office note of 11/07/22

## 2022-11-10 NOTE — Telephone Encounter (Signed)
Patient is requesting call back to discuss ablation procedure he is told he needs to have and the blood work needed for this procedure. Please advise.

## 2022-11-13 NOTE — Addendum Note (Signed)
Addended by: Bennie Dallas C on: 11/13/2022 10:14 AM   Modules accepted: Orders

## 2022-11-13 NOTE — Telephone Encounter (Signed)
Pt called [facility] wanting to schedule his SVT Ablation with Dr. Lewayne Bunting.    Pt chose 12/12/2022.  Scheduling called, and Pt SVT Ablation, scheduled for 12/12/2022 at 930 am.  Pt will need pre-procedure labs, CBC and BMET; pre-procedure letter and orders entered.    Follow up is required.

## 2022-11-15 DIAGNOSIS — I4891 Unspecified atrial fibrillation: Secondary | ICD-10-CM | POA: Diagnosis not present

## 2022-11-15 DIAGNOSIS — I700 Atherosclerosis of aorta: Secondary | ICD-10-CM | POA: Diagnosis not present

## 2022-11-15 DIAGNOSIS — I10 Essential (primary) hypertension: Secondary | ICD-10-CM | POA: Diagnosis not present

## 2022-11-16 ENCOUNTER — Ambulatory Visit: Payer: 59 | Admitting: Internal Medicine

## 2022-11-17 ENCOUNTER — Telehealth: Payer: Self-pay | Admitting: Cardiovascular Disease

## 2022-11-17 NOTE — Telephone Encounter (Signed)
  Pt c/o BP issue: STAT if pt c/o blurred vision, one-sided weakness or slurred speech  1. What are your last 5 BP readings?  Tuesday 4/23 8:30am 131/77   9:30am 97/72   11:50am 151/80   6:49pm  130/75   10:15pm 157/82   10:35pm 151/80 Wednesday 4/24 2am 121/45       2:06am 116/69       2:53am 111/69       7:18am 133/75       7:55am 159/77       12:06pm 147/84 This morning   139/77   127/76   126/71   120/73   147/88 These last two readings were sometime during the night   176/91    2. Are you having any other symptoms (ex. Dizziness, headache, blurred vision, passed out)? Patient states he has a headache last night off and on. He states he is feeling some dizziness.     3. What is your BP issue? Stating he is having BP issues.  Patient called stating he has no energy, is having stomach issues he does have IBS, states he is having leg weakness. He states he is mainly sleeping during the day, he is not sleeping at night. He went to his family on Wednesday and told him every was okay.  He states when his BP gets low he can't take a shower because he gets dizzy.

## 2022-11-17 NOTE — Telephone Encounter (Signed)
I spoke with patient and he will f/u with pcp.

## 2022-11-20 NOTE — Progress Notes (Deleted)
Referring Provider: Elfredia Nevins, MD Primary Care Physician:  Elfredia Nevins, MD Primary GI Physician: Dr. Jena Gauss  No chief complaint on file.   HPI:   Steven [name] is a 72 y.o. male presenting today with a  history of rectal pain in the setting of chronic anal fissure previously failed topical treatment and underwent Botox therapy with [place] surgery, ultimately referred to [facility] with recommendations to treat supportively with focus on magement of chronic constipation/IBS,  previously with morning nausea resolved with omeprazole daily, previously with weight loss and diagnosed with subacute thyroiditis treated with a course of methimazole and weight loss resolved, but hyperthyroidism returned January 2024 and weight began to decline as well,. He is presenting today with chief complaint of ***   Today:    Past Medical History:  Diagnosis Date   Anxiety    Arthritis    Atrial fibrillation (HCC)    Atrial flutter (HCC)    BPH (benign prostatic hyperplasia)    Chest pain    Dizziness    Dyspnea    laying down occ   Fracture 08/17/2015   MULTIPLE RIB FRACTURES     FROM FALL    GERD (gastroesophageal reflux disease)    Hemorrhoids    History of kidney stones    noted on CT scan   History of radiation therapy 08/22/11-10/13/11   prostate   Hyperlipemia    Hypertension    Hyperthyroidism    IBS (irritable bowel syndrome)    Insomnia    Light headedness    Migraine    Numbness and tingling in left arm    Numbness and tingling of both legs    Open fracture of left elbow 08/18/2015   Prostate cancer [facility])    prostate s/p radiation Mar 2013   Rib fractures 08/17/2015   Tinnitus     Past Surgical History:  Procedure Laterality Date   BOTOX INJECTION N/A 01/14/2020   Procedure: INJECTION OF BOTOX INTO ANAL SPHINCTER;  Surgeon: Andria Meuse, MD;  Location: [facility];  Service: General;  Laterality: N/A;   COLONOSCOPY N/A 12/19/2012    WUJ:[id number] 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 Normal appearing colon   COLONOSCOPY WITH PROPOFOL N/A 10/23/2019   Procedure: COLONOSCOPY WITH PROPOFOL;  Surgeon: Corbin Ade, MD; external and grade 2 internal hemorrhoids, abnormal rectal blood vessels consistent with radiation proctitis s/p APC therapy, otherwise normal exam.   EVALUATION UNDER ANESTHESIA WITH ANAL FISTULECTOMY N/A 01/14/2020   Procedure: ANORECTAL EXAM UNDER ANESTHESIA;  Surgeon: Andria Meuse, MD;  Location: [facility];  Service: General;  Laterality: N/A;   HOT HEMOSTASIS  10/23/2019   Procedure: HOT HEMOSTASIS (ARGON PLASMA COAGULATION/BICAP);  Surgeon: Corbin Ade, MD;  Location: [facility];  Service: Endoscopy;;  apc rectal proctitis     KIDNEY SURGERY  1982   kidney tube collapse repair   RADIOACTIVE SEED IMPLANT     Prostate    Current Outpatient Medications  Medication Sig Dispense Refill   acetaminophen (TYLENOL) 500 MG tablet Take 500 mg by mouth every 6 (six) hours as needed for moderate pain.     alfuzosin (UROXATRAL) 10 MG 24 hr tablet Take 1 tablet (10 mg total) by mouth daily with breakfast. 90 tablet 3   Cholecalciferol (VITAMIN D3) 125 MCG (5000 UT) CAPS Take 1 capsule by mouth daily.      DULoxetine (CYMBALTA) 30 MG capsule Take 30  mg by mouth daily.     ELIQUIS 5 MG TABS tablet TAKE 1 TABLET(5 MG) BY MOUTH TWICE DAILY 60 tablet 5   furosemide (LASIX) 20 MG tablet Take 1 tablet (20 mg total) by mouth daily. Take with or after meal. 90 tablet 3   gabapentin (NEURONTIN) 300 MG capsule Take 300 mg by mouth 3 (three) times daily as needed.     Galcanezumab-gnlm (EMGALITY) 120 MG/ML SOAJ Inject 1 Pen into the skin every 30 (thirty) days. 1.12 mL 7   losartan (COZAAR) 100 MG tablet Take 100 mg by mouth daily.     methimazole (TAPAZOLE) 10 MG tablet Take 1 tablet (10 mg total) by mouth daily with breakfast. 30 tablet 2   metoprolol  tartrate (LOPRESSOR) 50 MG tablet Take 1 tablet (50 mg total) by mouth 2 (two) times daily. 180 tablet 3   NEXLETOL 180 MG TABS Take 180 mg by mouth daily. Pt taking 2-3 times a week     ondansetron (ZOFRAN-ODT) 4 MG disintegrating tablet Take 1 tablet (4 mg total) by mouth every 8 (eight) hours as needed for nausea or vomiting. 20 tablet 1   pantoprazole (PROTONIX) 40 MG tablet Take 1 tablet (40 mg total) by mouth daily. 30 tablet 1   polyethylene glycol (MIRALAX / GLYCOLAX) 17 g packet Take 17 g by mouth daily. 30 each 2   potassium chloride SA (KLOR-CON M) 20 MEQ tablet Take 1 tablet (20 mEq total) by mouth 2 (two) times daily. (Patient taking differently: Take 80 mEq by mouth daily.)     Probiotic Product (PROBIOTIC-10 PO) Take 1 capsule by mouth daily.     Ubrogepant (UBRELVY) 100 MG TABS Take 1 tablet (100 mg total) by mouth as needed (for headaches). May repeat a dose in 2 hours if needed. Max dose 2 pills in 24 hours 16 tablet 6   vitamin B-12 (CYANOCOBALAMIN) 100 MCG tablet Take 100 mcg by mouth daily.     No current facility-administered medications for this visit.    Allergies as of 11/22/2022 - Review Complete 11/07/2022  Allergen Reaction Noted   Latex Rash 03/25/2020   Meclizine  09/29/2021    Family History  Problem Relation Age of Onset   Aneurysm Mother        aortic   Hypertension Mother    Headache Mother    Prostate cancer Father    Hypertension Father    Colon cancer Neg Hx    Colon polyps Neg Hx     Social History   Socioeconomic History   Marital status: Single    Spouse name: Not on file   Number of children: 2   Years of education: Not on file   Highest education level: 12th grade  Occupational History   Occupation: Custodian     Employer: [employer]  Tobacco Use   Smoking status: Former    Packs/day: 1.00    Years: 20.00    Additional pack years: 0.00    Total pack years: 20.00    Types: Cigarettes    Quit date: 2020    Years  since quitting: 4.3   Smokeless tobacco: Never   Tobacco comments:    Quit smoking x 2 years    07/2015   SOMETIIMES i USE VAPOR   Vaping Use   Vaping Use: Never used  Substance and Sexual Activity   Alcohol use: Never   Drug use: Never   Sexual activity: Not Currently  Other Topics Concern  Not on file  Social History Narrative   Lives with friend   Divorced   No caffeine   Social Determinants of Health   Financial Resource Strain: Low Risk  (11/03/2020)   Overall Financial Resource Strain (CARDIA)    Difficulty of Paying Living Expenses: Not hard at all  Food Insecurity: No Food Insecurity (11/03/2020)   Hunger Vital Sign    Worried About Running Out of Food in the Last Year: Never true    Ran Out of Food in the Last Year: Never true  Transportation Needs: No Transportation Needs (11/03/2020)   PRAPARE - Administrator, Civil Service (Medical): No    Lack of Transportation (Non-Medical): No  Physical Activity: Inactive (11/03/2020)   Exercise Vital Sign    Days of Exercise per Week: 0 days    Minutes of Exercise per Session: 0 min  Stress: No Stress Concern Present (11/03/2020)   [employer] of Occupational Health - Occupational Stress Questionnaire    Feeling of Stress : Not at all  Social Connections: Socially Isolated (11/03/2020)   Social Connection and Isolation Panel [NHANES]    Frequency of Communication with Friends and Family: Twice a week    Frequency of Social Gatherings with Friends and Family: Once a week    Attends Religious Services: Never    Database administrator or Organizations: No    Attends Engineer, structural: Never    Marital Status: Divorced    Review of Systems: Gen: Denies fever, chills, anorexia. Denies fatigue, weakness, weight loss.  CV: Denies chest pain, palpitations, syncope, peripheral edema, and claudication. Resp: Denies dyspnea at rest, cough, wheezing, coughing up blood, and pleurisy. GI: Denies vomiting  blood, jaundice, and fecal incontinence.   Denies dysphagia or odynophagia. Derm: Denies rash, itching, dry skin Psych: Denies depression, anxiety, memory loss, confusion. No homicidal or suicidal ideation.  Heme: Denies bruising, bleeding, and enlarged lymph nodes.  Physical Exam: There were no vitals taken for this visit. General:   Alert and oriented. No distress noted. Pleasant and cooperative.  Head:  Normocephalic and atraumatic. Eyes:  Conjuctiva clear without scleral icterus. Heart:  S1, S2 present without murmurs appreciated. Lungs:  Clear to auscultation bilaterally. No wheezes, rales, or rhonchi. No distress.  Abdomen:  +BS, soft, non-tender and non-distended. No rebound or guarding. No HSM or masses noted. Msk:  Symmetrical without gross deformities. Normal posture. Extremities:  Without edema. Neurologic:  Alert and  oriented x4 Psych:  Normal mood and affect.    Assessment:     Plan:  ***   Ermalinda Memos, PA-C [facility] 11/22/2022

## 2022-11-21 ENCOUNTER — Ambulatory Visit: Payer: 59 | Admitting: Internal Medicine

## 2022-11-21 DIAGNOSIS — I4891 Unspecified atrial fibrillation: Secondary | ICD-10-CM | POA: Diagnosis not present

## 2022-11-21 DIAGNOSIS — I10 Essential (primary) hypertension: Secondary | ICD-10-CM | POA: Diagnosis not present

## 2022-11-22 ENCOUNTER — Telehealth: Payer: Self-pay | Admitting: *Deleted

## 2022-11-22 ENCOUNTER — Telehealth: Payer: Self-pay | Admitting: Internal Medicine

## 2022-11-22 ENCOUNTER — Encounter (HOSPITAL_COMMUNITY): Payer: Self-pay | Admitting: "Endocrinology

## 2022-11-22 ENCOUNTER — Encounter: Payer: Self-pay | Admitting: Gastroenterology

## 2022-11-22 ENCOUNTER — Ambulatory Visit: Payer: 59 | Admitting: Gastroenterology

## 2022-11-22 NOTE — Telephone Encounter (Signed)
LMTRC

## 2022-11-22 NOTE — Telephone Encounter (Signed)
Error

## 2022-11-22 NOTE — Telephone Encounter (Signed)
Pt left message stating referral was not sent to to [facility].  Referral was faxed on 10/05/22 and re-faxed again today.

## 2022-11-23 ENCOUNTER — Other Ambulatory Visit: Payer: Self-pay

## 2022-11-23 ENCOUNTER — Encounter (HOSPITAL_COMMUNITY): Payer: Self-pay | Admitting: "Endocrinology

## 2022-11-23 ENCOUNTER — Inpatient Hospital Stay (HOSPITAL_COMMUNITY)
Admission: EM | Admit: 2022-11-23 | Discharge: 2022-11-27 | DRG: 312 | Disposition: A | Discharge status: Home / Self Care | Payer: Medicare HMO | Attending: Internal Medicine | Admitting: Internal Medicine

## 2022-11-23 ENCOUNTER — Telehealth: Payer: Self-pay | Admitting: Internal Medicine

## 2022-11-23 ENCOUNTER — Encounter (HOSPITAL_COMMUNITY): Payer: Self-pay | Admitting: *Deleted

## 2022-11-23 DIAGNOSIS — K5900 Constipation, unspecified: Secondary | ICD-10-CM | POA: Diagnosis present

## 2022-11-23 DIAGNOSIS — E0590 Thyrotoxicosis, unspecified without thyrotoxic crisis or storm: Secondary | ICD-10-CM | POA: Diagnosis present

## 2022-11-23 DIAGNOSIS — Z8249 Family history of ischemic heart disease and other diseases of the circulatory system: Secondary | ICD-10-CM

## 2022-11-23 DIAGNOSIS — N400 Enlarged prostate without lower urinary tract symptoms: Secondary | ICD-10-CM | POA: Diagnosis present

## 2022-11-23 DIAGNOSIS — I4892 Unspecified atrial flutter: Secondary | ICD-10-CM | POA: Diagnosis present

## 2022-11-23 DIAGNOSIS — G4700 Insomnia, unspecified: Secondary | ICD-10-CM | POA: Diagnosis present

## 2022-11-23 DIAGNOSIS — I10 Essential (primary) hypertension: Secondary | ICD-10-CM | POA: Diagnosis not present

## 2022-11-23 DIAGNOSIS — I471 Supraventricular tachycardia, unspecified: Secondary | ICD-10-CM | POA: Diagnosis present

## 2022-11-23 DIAGNOSIS — K219 Gastro-esophageal reflux disease without esophagitis: Secondary | ICD-10-CM | POA: Diagnosis present

## 2022-11-23 DIAGNOSIS — Z79899 Other long term (current) drug therapy: Secondary | ICD-10-CM | POA: Diagnosis not present

## 2022-11-23 DIAGNOSIS — G4733 Obstructive sleep apnea (adult) (pediatric): Secondary | ICD-10-CM | POA: Diagnosis present

## 2022-11-23 DIAGNOSIS — I480 Paroxysmal atrial fibrillation: Secondary | ICD-10-CM | POA: Diagnosis not present

## 2022-11-23 DIAGNOSIS — R519 Headache, unspecified: Secondary | ICD-10-CM | POA: Diagnosis not present

## 2022-11-23 DIAGNOSIS — E669 Obesity, unspecified: Secondary | ICD-10-CM | POA: Diagnosis not present

## 2022-11-23 DIAGNOSIS — Z6828 Body mass index (BMI) 28.0-28.9, adult: Secondary | ICD-10-CM

## 2022-11-23 DIAGNOSIS — R531 Weakness: Secondary | ICD-10-CM | POA: Diagnosis not present

## 2022-11-23 DIAGNOSIS — Z1152 Encounter for screening for COVID-19: Secondary | ICD-10-CM

## 2022-11-23 DIAGNOSIS — I951 Orthostatic hypotension: Secondary | ICD-10-CM | POA: Diagnosis not present

## 2022-11-23 DIAGNOSIS — F419 Anxiety disorder, unspecified: Secondary | ICD-10-CM | POA: Diagnosis present

## 2022-11-23 DIAGNOSIS — I160 Hypertensive urgency: Secondary | ICD-10-CM | POA: Diagnosis not present

## 2022-11-23 DIAGNOSIS — D72819 Decreased white blood cell count, unspecified: Secondary | ICD-10-CM | POA: Diagnosis not present

## 2022-11-23 DIAGNOSIS — Z8546 Personal history of malignant neoplasm of prostate: Secondary | ICD-10-CM

## 2022-11-23 DIAGNOSIS — R42 Dizziness and giddiness: Secondary | ICD-10-CM | POA: Diagnosis not present

## 2022-11-23 DIAGNOSIS — Z923 Personal history of irradiation: Secondary | ICD-10-CM

## 2022-11-23 DIAGNOSIS — Z7901 Long term (current) use of anticoagulants: Secondary | ICD-10-CM

## 2022-11-23 DIAGNOSIS — K580 Irritable bowel syndrome with diarrhea: Secondary | ICD-10-CM | POA: Diagnosis present

## 2022-11-23 DIAGNOSIS — E785 Hyperlipidemia, unspecified: Secondary | ICD-10-CM | POA: Diagnosis present

## 2022-11-23 DIAGNOSIS — Z8042 Family history of malignant neoplasm of prostate: Secondary | ICD-10-CM

## 2022-11-23 DIAGNOSIS — M1990 Unspecified osteoarthritis, unspecified site: Secondary | ICD-10-CM | POA: Diagnosis present

## 2022-11-23 DIAGNOSIS — Z888 Allergy status to other drugs, medicaments and biological substances status: Secondary | ICD-10-CM

## 2022-11-23 DIAGNOSIS — Z87891 Personal history of nicotine dependence: Secondary | ICD-10-CM

## 2022-11-23 DIAGNOSIS — R001 Bradycardia, unspecified: Secondary | ICD-10-CM | POA: Diagnosis present

## 2022-11-23 DIAGNOSIS — Z91040 Latex allergy status: Secondary | ICD-10-CM

## 2022-11-23 DIAGNOSIS — R0789 Other chest pain: Secondary | ICD-10-CM | POA: Diagnosis not present

## 2022-11-23 LAB — CBC WITH DIFFERENTIAL/PLATELET
Abs Immature Granulocytes: 0.01 10*3/uL (ref 0.00–0.07)
Basophils Absolute: 0 10*3/uL (ref 0.0–0.1)
Basophils Relative: 1 %
Eosinophils Absolute: 0 10*3/uL (ref 0.0–0.5)
Eosinophils Relative: 1 %
HCT: 41.7 % (ref 39.0–52.0)
Hemoglobin: 14.2 g/dL (ref 13.0–17.0)
Immature Granulocytes: 0 %
Lymphocytes Relative: 34 %
Lymphs Abs: 1.2 10*3/uL (ref 0.7–4.0)
MCH: 29.9 pg (ref 26.0–34.0)
MCHC: 34.1 g/dL (ref 30.0–36.0)
MCV: 87.8 fL (ref 80.0–100.0)
Monocytes Absolute: 0.2 10*3/uL (ref 0.1–1.0)
Monocytes Relative: 7 %
Neutro Abs: 2.1 10*3/uL (ref 1.7–7.7)
Neutrophils Relative %: 57 %
Platelets: 214 10*3/uL (ref 150–400)
RBC: 4.75 MIL/uL (ref 4.22–5.81)
RDW: 12.1 % (ref 11.5–15.5)
WBC: 3.7 10*3/uL — ABNORMAL LOW (ref 4.0–10.5)
nRBC: 0 % (ref 0.0–0.2)

## 2022-11-23 LAB — BASIC METABOLIC PANEL
Anion gap: 8 (ref 5–15)
BUN: 8 mg/dL (ref 8–23)
CO2: 26 mmol/L (ref 22–32)
Calcium: 9.4 mg/dL (ref 8.9–10.3)
Chloride: 101 mmol/L (ref 98–111)
Creatinine, Ser: 0.89 mg/dL (ref 0.61–1.24)
GFR, Estimated: >60 mL/min (ref >60)
Glucose, Bld: 115 mg/dL — ABNORMAL HIGH (ref 70–99)
Potassium: 3.9 mmol/L (ref 3.5–5.1)
Sodium: 135 mmol/L (ref 135–145)

## 2022-11-23 MED ORDER — ONDANSETRON HCL 4 MG PO TABS
4.0000 mg | ORAL_TABLET | Freq: Four times a day (QID) | ORAL | Status: DC | PRN
  Administered 2022-11-24: 4 mg via ORAL
  Filled 2022-11-23: qty 1

## 2022-11-23 MED ORDER — ACETAMINOPHEN 325 MG PO TABS
650.0000 mg | ORAL_TABLET | Freq: Once | ORAL | Status: AC
  Administered 2022-11-23: 650 mg via ORAL
  Filled 2022-11-23: qty 2

## 2022-11-23 MED ORDER — SODIUM CHLORIDE 0.9 % IV BOLUS
1000.0000 mL | Freq: Once | INTRAVENOUS | Status: AC
  Administered 2022-11-23: 1000 mL via INTRAVENOUS

## 2022-11-23 MED ORDER — ACETAMINOPHEN 325 MG PO TABS
650.0000 mg | ORAL_TABLET | Freq: Four times a day (QID) | ORAL | Status: DC | PRN
  Administered 2022-11-24 – 2022-11-26 (×8): 650 mg via ORAL
  Filled 2022-11-23 (×8): qty 2

## 2022-11-23 MED ORDER — ONDANSETRON HCL 4 MG/2ML IJ SOLN
4.0000 mg | Freq: Four times a day (QID) | INTRAMUSCULAR | Status: DC | PRN
  Administered 2022-11-24 – 2022-11-26 (×6): 4 mg via INTRAVENOUS
  Filled 2022-11-23 (×7): qty 2

## 2022-11-23 MED ORDER — ACETAMINOPHEN 650 MG RE SUPP: 650.0000 mg | Freq: Four times a day (QID) | RECTAL | Status: DC | PRN

## 2022-11-23 NOTE — ED Triage Notes (Signed)
Pt with c/o hypertension with laying down and dropping with standing. Pt states there has been recent changes to his HTN med regimen.

## 2022-11-23 NOTE — Telephone Encounter (Signed)
Pt would like a call back regarding cath procedure on 12/12/2022. Please advise.

## 2022-11-23 NOTE — ED Provider Notes (Signed)
[place] EMERGENCY DEPARTMENT AT [facility] Provider Note   CSN: [id number] Arrival date & time: 11/23/22  1710     History  Chief Complaint  Patient presents with   Hypertension    Steven [name] is a 72 y.o. male.  Patient has a history of hypertension.  He has also had some problems with orthostatic hypotension.  Patient complains of dizziness and weakness  The history is provided by the patient and medical records. No language interpreter was used.  Weakness Severity:  Moderate Onset quality:  Sudden Timing:  Constant Chronicity:  New Context: not alcohol use   Relieved by:  Nothing Worsened by:  Nothing Ineffective treatments:  None tried Associated symptoms: no abdominal pain, no chest pain, no cough, no diarrhea, no frequency, no headaches and no seizures        Home Medications Prior to Admission medications   Medication Sig Start Date End Date Taking? Authorizing Provider  acetaminophen (TYLENOL) 500 MG tablet Take 500 mg by mouth every 6 (six) hours as needed for moderate pain.   Yes [provider]  alfuzosin (UROXATRAL) 10 MG 24 hr tablet Take 1 tablet (10 mg total) by mouth daily with breakfast. 04/07/22  Yes [name], Mardene Celeste, MD  Cholecalciferol (VITAMIN D3) 125 MCG (5000 UT) CAPS Take 1 capsule by mouth daily.    Yes [provider]  cloNIDine (CATAPRES) 0.1 MG tablet Take 0.1 mg by mouth 2 (two) times daily. 11/02/22  Yes [provider]  ELIQUIS 5 MG TABS tablet TAKE 1 TABLET(5 MG) BY MOUTH TWICE DAILY Patient taking differently: Take 5 mg by mouth 2 (two) times daily. 10/18/22  Yes Wendall Stade, MD  Ergocalciferol 10 MCG (400 UNIT) TABS Take by mouth. 03/25/20  Yes [provider]  furosemide (LASIX) 20 MG tablet Take 1 tablet (20 mg total) by mouth daily. Take with or after meal. 11/07/22  Yes Marinus Maw, MD  gabapentin (NEURONTIN) 300 MG capsule Take 300 mg by mouth 3 (three) times daily as needed.  02/24/20  Yes [provider]  Galcanezumab-gnlm (EMGALITY) 120 MG/ML SOAJ Inject 1 Pen into the skin every 30 (thirty) days. 09/25/22  Yes Ocie Doyne, MD  losartan (COZAAR) 100 MG tablet Take 100 mg by mouth daily.   Yes [provider]  methimazole (TAPAZOLE) 10 MG tablet Take 1 tablet (10 mg total) by mouth daily with breakfast. 10/16/22  Yes Nida, Denman George, MD  metoprolol tartrate (LOPRESSOR) 50 MG tablet Take 1 tablet (50 mg total) by mouth 2 (two) times daily. 10/18/22 10/13/23 Yes Wendall Stade, MD  NEXLETOL 180 MG TABS Take 180 mg by mouth daily. Pt taking 2-3 times a week 12/03/19  Yes [provider]  NURTEC 75 MG TBDP DISSOLVE ONE TABLET BY MOUTH AS NEEDED 08/14/22  Yes [provider]  ondansetron (ZOFRAN-ODT) 4 MG disintegrating tablet Take 1 tablet (4 mg total) by mouth every 8 (eight) hours as needed for nausea or vomiting. 08/23/22  Yes Vassie Loll, MD  polyethylene glycol (MIRALAX / GLYCOLAX) 17 g packet Take 17 g by mouth daily. 08/23/22  Yes Vassie Loll, MD  potassium chloride SA (KLOR-CON M) 20 MEQ tablet Take 1 tablet (20 mEq total) by mouth 2 (two) times daily. Patient taking differently: Take 80 mEq by mouth daily. 08/23/22  Yes Vassie Loll, MD  Probiotic Product (PROBIOTIC-10 PO) Take 1 capsule by mouth daily.   Yes [provider]  traZODone (DESYREL) 50 MG  tablet Take 50-150 mg by mouth at bedtime. 11/14/22  Yes [provider]  Ubrogepant (UBRELVY) 100 MG TABS Take 1 tablet (100 mg total) by mouth as needed (for headaches). May repeat a dose in 2 hours if needed. Max dose 2 pills in 24 hours 09/25/22  Yes Chima, Victorino Dike, MD  vitamin B-12 (CYANOCOBALAMIN) 100 MCG tablet Take 100 mcg by mouth daily.   Yes [provider]  hydrALAZINE (APRESOLINE) 25 MG tablet Take 25 mg by mouth 3 (three) times daily. 10/09/22   [provider]  pantoprazole (PROTONIX) 40 MG tablet Take 1 tablet (40 mg total) by  mouth daily. Patient not taking: Reported on 11/23/2022 08/23/22   Vassie Loll, MD      Allergies    Latex and Meclizine    Review of Systems   Review of Systems  Constitutional:  Negative for appetite change and fatigue.  HENT:  Negative for congestion, ear discharge and sinus pressure.   Eyes:  Negative for discharge.  Respiratory:  Negative for cough.   Cardiovascular:  Negative for chest pain.  Gastrointestinal:  Negative for abdominal pain and diarrhea.  Genitourinary:  Negative for frequency and hematuria.  Musculoskeletal:  Negative for back pain.  Skin:  Negative for rash.  Neurological:  Positive for weakness. Negative for seizures and headaches.  Psychiatric/Behavioral:  Negative for hallucinations.     Physical Exam Updated Vital Signs BP (!) 215/84   Pulse (!) 49   Temp 98.7 F (37.1 C) (Oral)   Resp 20   Ht 5\' 7"  (1.702 m)   Wt 81.2 kg   SpO2 100%   BMI 28.04 kg/m  Physical Exam Vitals reviewed.  Constitutional:      Appearance: He is well-developed.  HENT:     Head: Normocephalic.     Nose: Nose normal.  Eyes:     General: No scleral icterus.    Conjunctiva/sclera: Conjunctivae normal.  Neck:     Thyroid: No thyromegaly.  Cardiovascular:     Rate and Rhythm: Normal rate and regular rhythm.     Heart sounds: No murmur heard.    No friction rub. No gallop.  Pulmonary:     Breath sounds: No stridor. No wheezing or rales.  Chest:     Chest wall: No tenderness.  Abdominal:     General: There is no distension.     Tenderness: There is no abdominal tenderness. There is no rebound.  Musculoskeletal:        General: Normal range of motion.     Cervical back: Neck supple.  Lymphadenopathy:     Cervical: No cervical adenopathy.  Skin:    Findings: No erythema or rash.  Neurological:     Mental Status: He is alert and oriented to person, place, and time.     Motor: No abnormal muscle tone.     Coordination: Coordination normal.  Psychiatric:         Behavior: Behavior normal.     ED Results / Procedures / Treatments   Labs (all labs ordered are listed, but only abnormal results are displayed) Labs Reviewed  CBC WITH DIFFERENTIAL/PLATELET - Abnormal; Notable for the following components:      Result Value   WBC 3.7 (*)    All other components within normal limits  BASIC METABOLIC PANEL - Abnormal; Notable for the following components:   Glucose, Bld 115 (*)    All other components within normal limits    EKG EKG Interpretation  Date/Time:  Thursday Nov 23 2022 17:45:12 EDT Ventricular Rate:  57 PR Interval:  174 QRS Duration: 76 QT Interval:  422 QTC Calculation: 410 R Axis:   -6 Text Interpretation: Sinus bradycardia Nonspecific T wave abnormality Abnormal ECG When compared with ECG of 30-Sep-2022 11:08, PREVIOUS ECG IS PRESENT Confirmed by Bethann Berkshire [phone]) on 11/23/2022 9:36:10 PM  Radiology No results found.  Procedures Procedures    Medications Ordered in ED Medications  sodium chloride 0.9 % bolus 1,000 mL (0 mLs Intravenous Stopped 11/23/22 2051)  acetaminophen (TYLENOL) tablet 650 mg (650 mg Oral Given 11/23/22 1928)    ED Course/ Medical Decision Making/ A&P                             Medical Decision Making Amount and/or Complexity of Data Reviewed Labs: ordered.  Risk OTC drugs. Decision regarding hospitalization.    This patient presents to the ED for concern of weakness, this involves an extensive number of treatment options, and is a complaint that carries with it a high risk of complications and morbidity.  The differential diagnosis includes hypertension, stroke, metabolic derangement  Co morbidities that complicate the patient evaluation  Hypertension   Additional history obtained:  Additional history obtained from patient External records from outside source obtained and reviewed including hospital records white count 3.7   Lab Tests:  I Ordered, and personally interpreted  labs.  The pertinent results include: White count 3.7, troponin normal   Imaging Studies ordered: No imaging Cardiac Monitoring: / EKG:  The patient was maintained on a cardiac monitor.  I personally viewed and interpreted the cardiac monitored which showed an underlying rhythm of: Normal sinus rhythm   Consultations Obtained:  I requested consultation with the hospitalist,  and discussed lab and imaging findings as well as pertinent plan - they recommend: Admit   Problem List / ED Course / Critical interventions / Medication management  Weakness and orthostatic hypotension I ordered medication including normal saline for hypotension Reevaluation of the patient after these medicines showed that the patient stayed the same I have reviewed the patients home medicines and have made adjustments as needed   Social Determinants of Health:  None   Test / Admission - Considered:  None   Patient is orthostatic after liter of normal saline. Patient will be admitted for orthostatic hypotension        Final Clinical Impression(s) / ED Diagnoses Final diagnoses:  Orthostatic hypotension    Rx / DC Orders ED Discharge Orders     None         Bethann Berkshire, MD 11/25/22 1402

## 2022-11-23 NOTE — ED Notes (Signed)
Patient ambulated to the bathroom. Patient is now resting in bed.  

## 2022-11-23 NOTE — H&P (Incomplete)
History and Physical    Patient: Steven [name] MVH:[id number] DOB: 1951/06/18 DOA: 11/23/2022 DOS: the patient was seen and examined on 11/23/2022 PCP: Elfredia Nevins, MD  Patient coming from: Home  Chief Complaint:  Chief Complaint  Patient presents with  . Hypertension   HPI: Steven [name] is a 72 y.o. male with medical history significant of hyperthyroidism, hypertension, obesity, anxiety, OSA, prostate cancer s/p XRT who presents to the emergency department due to      Review of Systems: {ROS_Text:26778} Past Medical History:  Diagnosis Date  . Anxiety   . Arthritis   . Atrial fibrillation (HCC)   . Atrial flutter (HCC)   . BPH (benign prostatic hyperplasia)   . Chest pain   . Dizziness   . Dyspnea    laying down occ  . Fracture 08/17/2015   MULTIPLE RIB FRACTURES     FROM FALL   . GERD (gastroesophageal reflux disease)   . Hemorrhoids   . History of kidney stones    noted on CT scan  . History of radiation therapy 08/22/11-10/13/11   prostate  . Hyperlipemia   . Hypertension   . Hyperthyroidism   . IBS (irritable bowel syndrome)   . Insomnia   . Light headedness   . Migraine   . Numbness and tingling in left arm   . Numbness and tingling of both legs   . Open fracture of left elbow 08/18/2015  . Prostate cancer [facility])    prostate s/p radiation Mar 2013  . Rib fractures 08/17/2015  . Tinnitus    Past Surgical History:  Procedure Laterality Date  . BOTOX INJECTION N/A 01/14/2020   Procedure: INJECTION OF BOTOX INTO ANAL SPHINCTER;  Surgeon: Andria Meuse, MD;  Location: [facility];  Service: General;  Laterality: N/A;  . 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 Normal appearing colon  . COLONOSCOPY WITH PROPOFOL N/A 10/23/2019   Procedure: COLONOSCOPY WITH PROPOFOL;  Surgeon: Corbin Ade, MD; external and grade 2 internal hemorrhoids, abnormal rectal blood  vessels consistent with radiation proctitis s/p APC therapy, otherwise normal exam.  . EVALUATION UNDER ANESTHESIA WITH ANAL FISTULECTOMY N/A 01/14/2020   Procedure: ANORECTAL EXAM UNDER ANESTHESIA;  Surgeon: Andria Meuse, MD;  Location: [facility];  Service: General;  Laterality: N/A;  . HOT HEMOSTASIS  10/23/2019   Procedure: HOT HEMOSTASIS (ARGON PLASMA COAGULATION/BICAP);  Surgeon: Corbin Ade, MD;  [facility];  Service: Endoscopy;;  apc rectal proctitis    . KIDNEY SURGERY  1982   kidney tube collapse repair  . RADIOACTIVE SEED IMPLANT     Prostate   Social History:  reports that he quit smoking about 4 years ago. His smoking use included cigarettes. He has a 20.00 pack-year smoking history. He has never used smokeless tobacco. He reports that he does not drink alcohol and does not use drugs.  Allergies  Allergen Reactions  . Latex Rash    Possible reaction to latex gloves per patient  . Meclizine     Other reaction(s): Abdominal Pain, Other    Family History  Problem Relation Age of Onset  . Aneurysm Mother        aortic  . Hypertension Mother   . Headache Mother   . Prostate cancer Father   . Hypertension Father   . Colon cancer Neg Hx   . Colon polyps Neg Hx  Prior to Admission medications   Medication Sig Start Date End Date Taking? Authorizing Provider  acetaminophen (TYLENOL) 500 MG tablet Take 500 mg by mouth every 6 (six) hours as needed for moderate pain.   Yes [provider]  alfuzosin (UROXATRAL) 10 MG 24 hr tablet Take 1 tablet (10 mg total) by mouth daily with breakfast. 04/07/22  Yes [name], Mardene Celeste, MD  Cholecalciferol (VITAMIN D3) 125 MCG (5000 UT) CAPS Take 1 capsule by mouth daily.    Yes [provider]  cloNIDine (CATAPRES) 0.1 MG tablet Take 0.1 mg by mouth 2 (two) times daily. 11/02/22  Yes [provider]  ELIQUIS 5 MG TABS tablet TAKE 1 TABLET(5 MG) BY MOUTH TWICE DAILY Patient taking differently:  Take 5 mg by mouth 2 (two) times daily. 10/18/22  Yes Wendall Stade, MD  Ergocalciferol 10 MCG (400 UNIT) TABS Take by mouth. 03/25/20  Yes [provider]  furosemide (LASIX) 20 MG tablet Take 1 tablet (20 mg total) by mouth daily. Take with or after meal. 11/07/22  Yes Marinus Maw, MD  gabapentin (NEURONTIN) 300 MG capsule Take 300 mg by mouth 3 (three) times daily as needed. 02/24/20  Yes [provider]  Galcanezumab-gnlm (EMGALITY) 120 MG/ML SOAJ Inject 1 Pen into the skin every 30 (thirty) days. 09/25/22  Yes Ocie Doyne, MD  losartan (COZAAR) 100 MG tablet Take 100 mg by mouth daily.   Yes [provider]  methimazole (TAPAZOLE) 10 MG tablet Take 1 tablet (10 mg total) by mouth daily with breakfast. 10/16/22  Yes Nida, Denman George, MD  metoprolol tartrate (LOPRESSOR) 50 MG tablet Take 1 tablet (50 mg total) by mouth 2 (two) times daily. 10/18/22 10/13/23 Yes Wendall Stade, MD  NEXLETOL 180 MG TABS Take 180 mg by mouth daily. Pt taking 2-3 times a week 12/03/19  Yes [provider]  NURTEC 75 MG TBDP DISSOLVE ONE TABLET BY MOUTH AS NEEDED 08/14/22  Yes [provider]  ondansetron (ZOFRAN-ODT) 4 MG disintegrating tablet Take 1 tablet (4 mg total) by mouth every 8 (eight) hours as needed for nausea or vomiting. 08/23/22  Yes Vassie Loll, MD  polyethylene glycol (MIRALAX / GLYCOLAX) 17 g packet Take 17 g by mouth daily. 08/23/22  Yes Vassie Loll, MD  potassium chloride SA (KLOR-CON M) 20 MEQ tablet Take 1 tablet (20 mEq total) by mouth 2 (two) times daily. Patient taking differently: Take 80 mEq by mouth daily. 08/23/22  Yes Vassie Loll, MD  Probiotic Product (PROBIOTIC-10 PO) Take 1 capsule by mouth daily.   Yes [provider]  traZODone (DESYREL) 50 MG tablet Take 50-150 mg by mouth at bedtime. 11/14/22  Yes [provider]  Ubrogepant (UBRELVY) 100 MG TABS Take 1 tablet (100 mg total) by mouth as needed (for headaches).  May repeat a dose in 2 hours if needed. Max dose 2 pills in 24 hours 09/25/22  Yes Chima, Victorino Dike, MD  vitamin B-12 (CYANOCOBALAMIN) 100 MCG tablet Take 100 mcg by mouth daily.   Yes [provider]  hydrALAZINE (APRESOLINE) 25 MG tablet Take 25 mg by mouth 3 (three) times daily. 10/09/22   [provider]  pantoprazole (PROTONIX) 40 MG tablet Take 1 tablet (40 mg total) by mouth daily. Patient not taking: Reported on 11/23/2022 08/23/22   Vassie Loll, MD    Physical Exam: Vitals:   11/23/22 2217 11/23/22 2227 11/23/22 2230 11/23/22 2300  BP: (!) 178/91  (!) 209/87 (!) 213/90  Pulse:  65  (!) 54 (!) 54  Resp: 17  (!) 24 19  Temp:  98.7 F (37.1 C)    TempSrc:  Oral    SpO2: 100%  100% 100%  Weight:      Height:       *** Data Reviewed: {Tip this will not be part of the note when signed- Document your independent interpretation of telemetry tracing, EKG, lab, Radiology test or any other diagnostic tests. Add any new diagnostic test ordered today. (Optional):26781} {Results:26384}  Assessment and Plan: No notes have been filed under this hospital service. Service: Hospitalist     Advance Care Planning:   Code Status: Full Code ***  Consults: ***  Family Communication: ***  Severity of Illness: {Observation/Inpatient:21159}  Author: Frankey Shown, DO 11/23/2022 11:45 PM  For on call review [email or web address].

## 2022-11-23 NOTE — H&P (Addendum)
History and Physical    Patient: Steven [name]:[id number] DOB: 1950/08/20 DOA: 11/23/2022 DOS: the patient was seen and examined on 11/24/2022 PCP: Elfredia Nevins, MD  Patient coming from: Home  Chief Complaint:  Chief Complaint  Patient presents with   Hypertension   HPI: Steven [name] is a 72 y.o. male with medical history significant of hyperthyroidism, hypertension, obesity, anxiety, OSA, prostate cancer s/p XRT who presents to the emergency department due to dizziness and weakness.  Patient complained of 3 week onset of dizziness and weakness which usually occurs when he goes from sitting to standing position.  He has a difficult to control blood pressure, and his PCP recently started him on clonidine as needed without any improvement in symptoms.  BP continue to stay elevated with SBP in the 200s and often times the SBP drops significantly with subsequent dizziness.  He denies chest pain, shortness of breath, fever, chills.  ED Course:  In the emergency department, BP on arrival was 143/90, pulse 56 bpm, other vital signs are within normal range.  Workup in the ED showed normal CBC except WBC of 3.0, BMP was normal except blood glucose of 115. Tylenol was given, IV hydration of 1 L NS was given.  Hospitalist was asked to admit patient for further evaluation and management.  Review of Systems: Review of systems as noted in the HPI. All other systems reviewed and are negative.   Past Medical History:  Diagnosis Date   Anxiety    Arthritis    Atrial fibrillation (HCC)    Atrial flutter (HCC)    BPH (benign prostatic hyperplasia)    Chest pain    Dizziness    Dyspnea    laying down occ   Fracture 08/17/2015   MULTIPLE RIB FRACTURES     FROM FALL    GERD (gastroesophageal reflux disease)    Hemorrhoids    History of kidney stones    noted on CT scan   History of radiation therapy 08/22/11-10/13/11   prostate   Hyperlipemia    Hypertension    Hyperthyroidism    IBS  (irritable bowel syndrome)    Insomnia    Light headedness    Migraine    Numbness and tingling in left arm    Numbness and tingling of both legs    Open fracture of left elbow 08/18/2015   Prostate cancer [facility])    prostate s/p radiation Mar 2013   Rib fractures 08/17/2015   Tinnitus    Past Surgical History:  Procedure Laterality Date   BOTOX INJECTION N/A 01/14/2020   Procedure: INJECTION OF BOTOX INTO ANAL SPHINCTER;  Surgeon: Andria Meuse, MD;  Location: [facility];  Service: General;  Laterality: N/A;   COLONOSCOPY N/A 12/19/2012   QIO:NGEXBM 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 Normal appearing colon   COLONOSCOPY WITH PROPOFOL N/A 10/23/2019   Procedure: COLONOSCOPY WITH PROPOFOL;  Surgeon: Corbin Ade, MD; external and grade 2 internal hemorrhoids, abnormal rectal blood vessels consistent with radiation proctitis s/p APC therapy, otherwise normal exam.   EVALUATION UNDER ANESTHESIA WITH ANAL FISTULECTOMY N/A 01/14/2020   Procedure: ANORECTAL EXAM UNDER ANESTHESIA;  Surgeon: Andria Meuse, MD;  Location: [facility];  Service: General;  Laterality: N/A;   HOT HEMOSTASIS  10/23/2019   Procedure: HOT HEMOSTASIS (ARGON PLASMA COAGULATION/BICAP);  Surgeon: Corbin Ade, MD;  [facility];  Service: Endoscopy;;  apc rectal proctitis  KIDNEY SURGERY  1982   kidney tube collapse repair   RADIOACTIVE SEED IMPLANT     Prostate    Social History:  reports that he quit smoking about 4 years ago. His smoking use included cigarettes. He has a 20.00 pack-year smoking history. He has never used smokeless tobacco. He reports that he does not drink alcohol and does not use drugs.   Allergies  Allergen Reactions   Latex Rash    Possible reaction to latex gloves per patient   Meclizine     Other reaction(s): Abdominal Pain, Other    Family History  Problem Relation Age of Onset   Aneurysm Mother         aortic   Hypertension Mother    Headache Mother    Prostate cancer Father    Hypertension Father    Colon cancer Neg Hx    Colon polyps Neg Hx      Prior to Admission medications   Medication Sig Start Date End Date Taking? Authorizing Provider  acetaminophen (TYLENOL) 500 MG tablet Take 500 mg by mouth every 6 (six) hours as needed for moderate pain.   Yes [provider]  alfuzosin (UROXATRAL) 10 MG 24 hr tablet Take 1 tablet (10 mg total) by mouth daily with breakfast. 04/07/22  Yes [name], Mardene Celeste, MD  Cholecalciferol (VITAMIN D3) 125 MCG (5000 UT) CAPS Take 1 capsule by mouth daily.    Yes [provider]  cloNIDine (CATAPRES) 0.1 MG tablet Take 0.1 mg by mouth 2 (two) times daily. 11/02/22  Yes [provider]  ELIQUIS 5 MG TABS tablet TAKE 1 TABLET(5 MG) BY MOUTH TWICE DAILY Patient taking differently: Take 5 mg by mouth 2 (two) times daily. 10/18/22  Yes Wendall Stade, MD  Ergocalciferol 10 MCG (400 UNIT) TABS Take by mouth. 03/25/20  Yes [provider]  furosemide (LASIX) 20 MG tablet Take 1 tablet (20 mg total) by mouth daily. Take with or after meal. 11/07/22  Yes Marinus Maw, MD  gabapentin (NEURONTIN) 300 MG capsule Take 300 mg by mouth 3 (three) times daily as needed. 02/24/20  Yes [provider]  Galcanezumab-gnlm (EMGALITY) 120 MG/ML SOAJ Inject 1 Pen into the skin every 30 (thirty) days. 09/25/22  Yes Ocie Doyne, MD  losartan (COZAAR) 100 MG tablet Take 100 mg by mouth daily.   Yes [provider]  methimazole (TAPAZOLE) 10 MG tablet Take 1 tablet (10 mg total) by mouth daily with breakfast. 10/16/22  Yes Nida, Denman George, MD  metoprolol tartrate (LOPRESSOR) 50 MG tablet Take 1 tablet (50 mg total) by mouth 2 (two) times daily. 10/18/22 10/13/23 Yes Wendall Stade, MD  NEXLETOL 180 MG TABS Take 180 mg by mouth daily. Pt taking 2-3 times a week 12/03/19  Yes [provider]  NURTEC 75 MG TBDP DISSOLVE  ONE TABLET BY MOUTH AS NEEDED 08/14/22  Yes [provider]  ondansetron (ZOFRAN-ODT) 4 MG disintegrating tablet Take 1 tablet (4 mg total) by mouth every 8 (eight) hours as needed for nausea or vomiting. 08/23/22  Yes Vassie Loll, MD  polyethylene glycol (MIRALAX / GLYCOLAX) 17 g packet Take 17 g by mouth daily. 08/23/22  Yes Vassie Loll, MD  potassium chloride SA (KLOR-CON M) 20 MEQ tablet Take 1 tablet (20 mEq total) by mouth 2 (two) times daily. Patient taking differently: Take 80 mEq by mouth daily. 08/23/22  Yes Vassie Loll, MD  Probiotic Product (PROBIOTIC-10 PO) Take 1 capsule by mouth  daily.   Yes [provider]  traZODone (DESYREL) 50 MG tablet Take 50-150 mg by mouth at bedtime. 11/14/22  Yes [provider]  Ubrogepant (UBRELVY) 100 MG TABS Take 1 tablet (100 mg total) by mouth as needed (for headaches). May repeat a dose in 2 hours if needed. Max dose 2 pills in 24 hours 09/25/22  Yes Chima, Victorino Dike, MD  vitamin B-12 (CYANOCOBALAMIN) 100 MCG tablet Take 100 mcg by mouth daily.   Yes [provider]  hydrALAZINE (APRESOLINE) 25 MG tablet Take 25 mg by mouth 3 (three) times daily. 10/09/22   [provider]  pantoprazole (PROTONIX) 40 MG tablet Take 1 tablet (40 mg total) by mouth daily. Patient not taking: Reported on 11/23/2022 08/23/22   Vassie Loll, MD    Physical Exam: BP (!) 213/82   Pulse (!) 51   Temp 98.7 F (37.1 C) (Oral)   Resp 13   Ht 5\' 7"  (1.702 m)   Wt 81.2 kg   SpO2 100%   BMI 28.04 kg/m   General: 72 y.o. year-old male well developed well nourished in no acute distress.  Alert and oriented x3. HEENT: NCAT, EOMI Neck: Supple, trachea medial Cardiovascular: Bradycardia.  Regular rate and rhythm with no rubs or gallops.  No thyromegaly or JVD noted.  No lower extremity edema. 2/4 pulses in all 4 extremities. Respiratory: Clear to auscultation with no wheezes or rales. Good inspiratory effort. Abdomen: Soft,  nontender nondistended with normal bowel sounds x4 quadrants. Muskuloskeletal: No cyanosis, clubbing or edema noted bilaterally Neuro: CN II-XII intact, strength 5/5 x 4, sensation, reflexes intact Skin: No ulcerative lesions noted or rashes Psychiatry: Judgement and insight appear normal. Mood is appropriate for condition and setting          Labs on Admission:  Basic Metabolic Panel: Recent Labs  Lab 11/23/22 1846  NA 135  K 3.9  CL 101  CO2 26  GLUCOSE 115*  BUN 8  CREATININE 0.89  CALCIUM 9.4   Liver Function Tests: No results for input(s): "AST", "ALT", "ALKPHOS", "BILITOT", "PROT", "ALBUMIN" in the last 168 hours. No results for input(s): "LIPASE", "AMYLASE" in the last 168 hours. No results for input(s): "AMMONIA" in the last 168 hours. CBC: Recent Labs  Lab 11/23/22 1846  WBC 3.7*  NEUTROABS 2.1  HGB 14.2  HCT 41.7  MCV 87.8  PLT 214   Cardiac Enzymes: No results for input(s): "CKTOTAL", "CKMB", "CKMBINDEX", "TROPONINI" in the last 168 hours.  BNP (last 3 results) Recent Labs    08/01/22 0827 08/13/22 0347  BNP 7.0 8.0    ProBNP (last 3 results) No results for input(s): "PROBNP" in the last 8760 hours.  CBG: No results for input(s): "GLUCAP" in the last 168 hours.  Radiological Exams on Admission: No results found.  EKG: I independently viewed the EKG done and my findings are as followed: Sinus bradycardia at rate of 57 bpm with nonspecific T wave abnormalities  Assessment/Plan Present on Admission:  Orthostatic hypotension  Hyperthyroidism  Principal Problem:   Hypertensive urgency Active Problems:   Hyperthyroidism   Personal history of malignant neoplasm of prostate   Orthostatic hypotension   Leukopenia  Hypertensive urgency Essential hypertension (uncontrolled) Continue clonidine Continue IV hydralazine 10 mg every 6 as needed for SBP > 170 Metoprolol temporarily held due to bradycardia Patient has been having difficulty in  being able to control BP.  Cardiology will be consulted, shall await further recommendation  Orthostatic hypotension Patient complained of weakness  and dizziness when he goes from sitting to standing position Orthostatic vital signs in the ED was positive Continue IV hydration  Atypical chest pain Patient complained of nonreproducible sharp pain across the chest (from left to right) without radiation. EKG personally reviewed was done and showed normal sinus rhythm at a rate of 62 bpm with nonspecific T wave abnormalities Troponin x 2 - 16 > 17. Patient chest pain does not seem to be related to an acute cardiac event  Chronic leukopenia WBC 3.7, continue to monitor WBC with morning labs  SVT/paroxysmal atrial flutter/paroxysmal A-fib Patient has prior history of atrial fibrillation EKG personally reviewed showed sinus bradycardia at a rate of 57 bpm with nonspecific T wave abnormalities Continue apixaban Metoprolol temporarily held due to bradycardia  Hyperthyroidism Continue methimazole Patient follows with Dr. Fransico Him (endocrinologist)  Prostate cancer/BPH Continue alfuzosin  IBS/diarrhea/constipation Stable.   DVT prophylaxis: Eliquis  Advance Care Planning:   Code Status: Full Code   Consults: None  Family Communication: Significant other at bedside  Severity of Illness: The appropriate patient status for this patient is OBSERVATION. Observation status is judged to be reasonable and necessary in order to provide the required intensity of service to ensure the patient's safety. The patient's presenting symptoms, physical exam findings, and initial radiographic and laboratory data in the context of their medical condition is felt to place them at decreased risk for further clinical deterioration. Furthermore, it is anticipated that the patient will be medically stable for discharge from the hospital within 2 midnights of admission.   Author: Frankey Shown, DO 11/24/2022  12:50 AM  For on call review [email or web address].

## 2022-11-23 NOTE — Telephone Encounter (Signed)
Pt  called back per message received in [facility] Triage.    Pt has billing concern.  Pt had [employer], but now does not... it has caused a billing concern for him.  Per Pt, he now has Medicare and [employer].  I will forward this concern to billing for follow up call.    Pt shared that he has had some BP concerns.  BP ranges from 200's / 90's to 112/68.  Pt BP recently has been running high.  Pt PCP prescribed clonidine 0.1 mg tablets to take twice daily as needed.  This was not controlling BP, Per Pt, PCP told pt Ok to double this order to better control his BP;  Pt also stated he has second blood pressure medicine coming on May 8.   Pt educated and spoke with him in great length regarding, NOT doubling the Clonidine BP med, when STARTing his new BP medicine.  ( Pt did not know the name of the new BP med? )  Pt educated on hypotension, and that taking too much blood pressure medicine, could result in ER visit, and will become symptomatic with too much BP medicine in his system.  Pt verbalized some understanding, but also advised to contact his PCP office RN for directions when he receives the NEW BP med.  Pt verbalized understanding to this, but at times during our conversation, found listening to instructions challenging.  The concern of hypotension was understood more at the end of the telephone call.  Pt should call PCP office when receiving new BP med.    Pt lab draw scheduled for 5/10 at [facility] on [address] for pre-procedure labs.

## 2022-11-24 ENCOUNTER — Other Ambulatory Visit: Payer: Self-pay

## 2022-11-24 DIAGNOSIS — G4700 Insomnia, unspecified: Secondary | ICD-10-CM | POA: Diagnosis present

## 2022-11-24 DIAGNOSIS — I4892 Unspecified atrial flutter: Secondary | ICD-10-CM | POA: Diagnosis present

## 2022-11-24 DIAGNOSIS — Z1152 Encounter for screening for COVID-19: Secondary | ICD-10-CM | POA: Diagnosis not present

## 2022-11-24 DIAGNOSIS — I951 Orthostatic hypotension: Secondary | ICD-10-CM | POA: Diagnosis present

## 2022-11-24 DIAGNOSIS — I160 Hypertensive urgency: Secondary | ICD-10-CM | POA: Diagnosis present

## 2022-11-24 DIAGNOSIS — R001 Bradycardia, unspecified: Secondary | ICD-10-CM | POA: Diagnosis present

## 2022-11-24 DIAGNOSIS — Z8249 Family history of ischemic heart disease and other diseases of the circulatory system: Secondary | ICD-10-CM | POA: Diagnosis not present

## 2022-11-24 DIAGNOSIS — Z8546 Personal history of malignant neoplasm of prostate: Secondary | ICD-10-CM | POA: Diagnosis not present

## 2022-11-24 DIAGNOSIS — I471 Supraventricular tachycardia, unspecified: Secondary | ICD-10-CM | POA: Insufficient documentation

## 2022-11-24 DIAGNOSIS — M1990 Unspecified osteoarthritis, unspecified site: Secondary | ICD-10-CM | POA: Diagnosis present

## 2022-11-24 DIAGNOSIS — D72819 Decreased white blood cell count, unspecified: Secondary | ICD-10-CM | POA: Insufficient documentation

## 2022-11-24 DIAGNOSIS — F419 Anxiety disorder, unspecified: Secondary | ICD-10-CM | POA: Diagnosis present

## 2022-11-24 DIAGNOSIS — E785 Hyperlipidemia, unspecified: Secondary | ICD-10-CM | POA: Diagnosis present

## 2022-11-24 DIAGNOSIS — I480 Paroxysmal atrial fibrillation: Secondary | ICD-10-CM | POA: Diagnosis present

## 2022-11-24 DIAGNOSIS — R0789 Other chest pain: Secondary | ICD-10-CM | POA: Diagnosis not present

## 2022-11-24 DIAGNOSIS — Z79899 Other long term (current) drug therapy: Secondary | ICD-10-CM | POA: Diagnosis not present

## 2022-11-24 DIAGNOSIS — K219 Gastro-esophageal reflux disease without esophagitis: Secondary | ICD-10-CM | POA: Diagnosis present

## 2022-11-24 DIAGNOSIS — E0590 Thyrotoxicosis, unspecified without thyrotoxic crisis or storm: Secondary | ICD-10-CM | POA: Diagnosis present

## 2022-11-24 DIAGNOSIS — K580 Irritable bowel syndrome with diarrhea: Secondary | ICD-10-CM | POA: Diagnosis present

## 2022-11-24 DIAGNOSIS — Z7901 Long term (current) use of anticoagulants: Secondary | ICD-10-CM | POA: Diagnosis not present

## 2022-11-24 DIAGNOSIS — E669 Obesity, unspecified: Secondary | ICD-10-CM | POA: Diagnosis present

## 2022-11-24 DIAGNOSIS — K5900 Constipation, unspecified: Secondary | ICD-10-CM | POA: Diagnosis present

## 2022-11-24 DIAGNOSIS — N400 Enlarged prostate without lower urinary tract symptoms: Secondary | ICD-10-CM | POA: Diagnosis present

## 2022-11-24 DIAGNOSIS — R519 Headache, unspecified: Secondary | ICD-10-CM | POA: Diagnosis not present

## 2022-11-24 DIAGNOSIS — G4733 Obstructive sleep apnea (adult) (pediatric): Secondary | ICD-10-CM | POA: Diagnosis present

## 2022-11-24 LAB — MAGNESIUM: Magnesium: 1.9 mg/dL (ref 1.7–2.4)

## 2022-11-24 LAB — COMPREHENSIVE METABOLIC PANEL
ALT: 13 U/L (ref 0–44)
AST: 14 U/L — ABNORMAL LOW (ref 15–41)
Albumin: 3.5 g/dL (ref 3.5–5.0)
Alkaline Phosphatase: 56 U/L (ref 38–126)
Anion gap: 9 (ref 5–15)
BUN: 7 mg/dL — ABNORMAL LOW (ref 8–23)
CO2: 25 mmol/L (ref 22–32)
Calcium: 9.1 mg/dL (ref 8.9–10.3)
Chloride: 105 mmol/L (ref 98–111)
Creatinine, Ser: 0.74 mg/dL (ref 0.61–1.24)
GFR, Estimated: >60 mL/min (ref >60)
Glucose, Bld: 94 mg/dL (ref 70–99)
Potassium: 3.6 mmol/L (ref 3.5–5.1)
Sodium: 139 mmol/L (ref 135–145)
Total Bilirubin: 1.1 mg/dL (ref 0.3–1.2)
Total Protein: 6.3 g/dL — ABNORMAL LOW (ref 6.5–8.1)

## 2022-11-24 LAB — CBC
HCT: 38.9 % — ABNORMAL LOW (ref 39.0–52.0)
Hemoglobin: 13 g/dL (ref 13.0–17.0)
MCH: 29.2 pg (ref 26.0–34.0)
MCHC: 33.4 g/dL (ref 30.0–36.0)
MCV: 87.4 fL (ref 80.0–100.0)
Platelets: 198 10*3/uL (ref 150–400)
RBC: 4.45 MIL/uL (ref 4.22–5.81)
RDW: 12.2 % (ref 11.5–15.5)
WBC: 3.5 10*3/uL — ABNORMAL LOW (ref 4.0–10.5)
nRBC: 0 % (ref 0.0–0.2)

## 2022-11-24 LAB — TROPONIN I (HIGH SENSITIVITY)
Troponin I (High Sensitivity): 16 ng/L (ref <18)
Troponin I (High Sensitivity): 17 ng/L (ref <18)

## 2022-11-24 LAB — PHOSPHORUS: Phosphorus: 3.2 mg/dL (ref 2.5–4.6)

## 2022-11-24 LAB — GLUCOSE, CAPILLARY: Glucose-Capillary: 117 mg/dL — ABNORMAL HIGH (ref 70–99)

## 2022-11-24 LAB — MRSA NEXT GEN BY PCR, NASAL: MRSA by PCR Next Gen: NOT DETECTED

## 2022-11-24 MED ORDER — SODIUM CHLORIDE 0.9 % IV BOLUS
500.0000 mL | Freq: Once | INTRAVENOUS | Status: AC
  Administered 2022-11-24: 500 mL via INTRAVENOUS

## 2022-11-24 MED ORDER — HYDRALAZINE HCL 20 MG/ML IJ SOLN
10.0000 mg | Freq: Four times a day (QID) | INTRAMUSCULAR | Status: DC | PRN
  Administered 2022-11-24 – 2022-11-26 (×6): 10 mg via INTRAVENOUS
  Filled 2022-11-24 (×6): qty 1

## 2022-11-24 MED ORDER — SALINE SPRAY 0.65 % NA SOLN
1.0000 | NASAL | Status: DC | PRN
  Administered 2022-11-24: 1 via NASAL
  Filled 2022-11-24: qty 44

## 2022-11-24 MED ORDER — SODIUM CHLORIDE 0.9 % IV SOLN: INTRAVENOUS | Status: AC

## 2022-11-24 MED ORDER — CHLORHEXIDINE GLUCONATE CLOTH 2 % EX PADS
6.0000 | MEDICATED_PAD | Freq: Every day | CUTANEOUS | Status: DC
  Administered 2022-11-24 – 2022-11-25 (×3): 6 via TOPICAL

## 2022-11-24 MED ORDER — POLYETHYLENE GLYCOL 3350 17 G PO PACK
17.0000 g | PACK | Freq: Every day | ORAL | Status: DC | PRN
  Administered 2022-11-24 – 2022-11-26 (×3): 17 g via ORAL
  Filled 2022-11-24 (×3): qty 1

## 2022-11-24 MED ORDER — METHIMAZOLE 5 MG PO TABS: 10.0000 mg | ORAL_TABLET | Freq: Every day | ORAL | Status: DC

## 2022-11-24 MED ORDER — METHIMAZOLE 5 MG PO TABS
10.0000 mg | ORAL_TABLET | Freq: Every day | ORAL | Status: DC
  Administered 2022-11-24 – 2022-11-26 (×3): 10 mg via ORAL
  Filled 2022-11-24 (×4): qty 2

## 2022-11-24 MED ORDER — PSYLLIUM 95 % PO PACK
1.0000 | PACK | Freq: Every day | ORAL | Status: DC | PRN
  Filled 2022-11-24: qty 1

## 2022-11-24 MED ORDER — APIXABAN 5 MG PO TABS
5.0000 mg | ORAL_TABLET | Freq: Two times a day (BID) | ORAL | Status: DC
  Administered 2022-11-24 – 2022-11-27 (×8): 5 mg via ORAL
  Filled 2022-11-24 (×8): qty 1

## 2022-11-24 MED ORDER — ORAL CARE MOUTH RINSE: 15.0000 mL | OROMUCOSAL | Status: DC | PRN

## 2022-11-24 MED ORDER — LORAZEPAM 0.5 MG PO TABS
0.5000 mg | ORAL_TABLET | Freq: Once | ORAL | Status: AC
  Administered 2022-11-24: 0.5 mg via ORAL
  Filled 2022-11-24: qty 1

## 2022-11-24 MED ORDER — ALFUZOSIN HCL ER 10 MG PO TB24
10.0000 mg | ORAL_TABLET | Freq: Every day | ORAL | Status: DC
  Administered 2022-11-24 – 2022-11-27 (×4): 10 mg via ORAL
  Filled 2022-11-24 (×6): qty 1

## 2022-11-24 MED ORDER — HYDROXYZINE HCL 10 MG PO TABS
10.0000 mg | ORAL_TABLET | Freq: Three times a day (TID) | ORAL | Status: DC | PRN
  Administered 2022-11-24 – 2022-11-26 (×8): 10 mg via ORAL
  Filled 2022-11-24 (×8): qty 1

## 2022-11-24 MED ORDER — CLONIDINE HCL 0.1 MG PO TABS
0.1000 mg | ORAL_TABLET | Freq: Two times a day (BID) | ORAL | Status: DC
  Administered 2022-11-24 (×2): 0.1 mg via ORAL
  Filled 2022-11-24 (×2): qty 1

## 2022-11-24 MED ORDER — OXYCODONE-ACETAMINOPHEN 5-325 MG PO TABS
1.0000 | ORAL_TABLET | Freq: Once | ORAL | Status: AC | PRN
  Administered 2022-11-24: 1 via ORAL
  Filled 2022-11-24: qty 1

## 2022-11-24 MED ORDER — METOPROLOL TARTRATE 25 MG PO TABS
25.0000 mg | ORAL_TABLET | Freq: Two times a day (BID) | ORAL | Status: DC
  Administered 2022-11-24 – 2022-11-27 (×7): 25 mg via ORAL
  Filled 2022-11-24 (×7): qty 1

## 2022-11-24 NOTE — Progress Notes (Signed)
DSS and Midwife information added to AVS for patient to follow up with regarding assistance with gas bill.

## 2022-11-24 NOTE — Progress Notes (Signed)
  Transition of Care [facility]) Screening Note   Patient Details  Name: Steven [name] Date of Birth: [birth year]   Transition of Care [facility]) CM/SW Contact:    Annice Needy, LCSW Phone Number: 11/24/2022, 1:38 PM    Transition of Care Department [facility]) has reviewed patient and no TOC needs have been identified at this time. We will continue to monitor patient advancement through interdisciplinary progression rounds. If new patient transition needs arise, please place a TOC consult.

## 2022-11-24 NOTE — Plan of Care (Signed)

## 2022-11-24 NOTE — Care Management Obs Status (Signed)
MEDICARE OBSERVATION STATUS NOTIFICATION   Patient Details  Name: Steven [name] MRN: [id number] Date of Birth: 09-16-1950   Medicare Observation Status Notification Given:  Yes    Corey Harold 11/24/2022, 1:21 PM

## 2022-11-24 NOTE — TOC Initial Note (Signed)
Transition of Care [facility]) - Initial/Assessment Note    Patient Details  Name: Steven [name] MRN: [id number] Date of Birth: [birth year]  [facility] CM/SW Contact:    Annice Needy, LCSW Phone Number: 11/24/2022, 2:28 PM  Clinical Narrative:                 [facility] consulted for medication assistance and assistance with gas bill. Patient states that he does not need assistance with his medication, he needs assistance paying his medical bills. Advised that he could speak with the financial counselor. He advised that he has been working with the billing department on payment arrangement for his bills. Discussed that patient his gas is off currently. Advised patient to contact DSS or the [employer] for assistance with bill pay. Added to AVS.  TOC signing off.   Expected Discharge Plan: Home/Self Care     Patient Goals and CMS Choice Patient states their goals for this hospitalization and ongoing recovery are:: return home          Expected Discharge Plan and Services                                              Prior Living Arrangements/Services                       Activities of Daily Living Home Assistive Devices/Equipment: Cane (specify quad or straight) (Straight tip cane) ADL Screening (condition at time of admission) Patient's cognitive ability adequate to safely complete daily activities?: Yes Is the patient deaf or have difficulty hearing?: No Does the patient have difficulty seeing, even when wearing glasses/contacts?: No Does the patient have difficulty concentrating, remembering, or making decisions?: Yes Patient able to express need for assistance with ADLs?: Yes Does the patient have difficulty dressing or bathing?: No Independently performs ADLs?: Yes (appropriate for developmental age) Does the patient have difficulty walking or climbing stairs?: Yes Weakness of Legs: Both Weakness of Arms/Hands: None  Permission  Sought/Granted                  Emotional Assessment     Affect (typically observed): Appropriate Orientation: : Oriented to Self, Oriented to Place, Oriented to  Time, Oriented to Situation Alcohol / Substance Use: Not Applicable Psych Involvement: (P) No (comment)  Admission diagnosis:  Orthostatic hypotension [I95.1] SVT (supraventricular tachycardia) [I47.10] Patient Active Problem List   Diagnosis Date Noted   Hypertensive urgency 11/24/2022   Leukopenia 11/24/2022   SVT (supraventricular tachycardia) 11/24/2022   Orthostatic hypotension 11/23/2022   Chest tightness 08/22/2022   Hypokalemia 08/21/2022   Calculus of gallbladder without cholecystitis without obstruction 08/09/2022   Secondary adrenocortical insufficiency (HCC) 07/19/2022   Rash and nonspecific skin eruption 05/10/2022   Dysphagia 05/10/2022   Chronic pain 07/07/2020   Carcinoma of prostate (HCC) 02/17/2020   Subacute thyroiditis 01/07/2020   Personal history of malignant neoplasm of prostate 12/12/2019   Benign prostatic hyperplasia with urinary obstruction 12/12/2019   Weak urinary stream 12/12/2019   Hyperthyroidism 11/24/2019   Unintentional weight loss 10/21/2019   IBS (irritable bowel syndrome) 10/21/2019   Dizzy spells 09/20/2019   Intractable chronic migraine without aura 09/20/2019   Obstructive sleep apnea syndrome 09/20/2019   Psychophysiologic insomnia 09/20/2019   Rectal pain 08/21/2019   Nausea without vomiting 05/30/2019   Paroxysmal atrial flutter (  HCC) 04/17/2019   Chronic atrial fibrillation (HCC) 03/02/2019   HTN (hypertension) 03/02/2019   Constipation 08/02/2017   Hemorrhoids 08/02/2017   Fall 08/18/2015   Radiation proctitis 01/30/2013   Rectal bleeding 12/10/2012   Obese 05/30/2012   Malignant neoplasm of prostate (HCC) 08/13/2011   PCP:  Elfredia Nevins, MD Pharmacy:   [facility] [phone] - [place], [place] - [address] [place] [address] Phone: [phone] Fax:  [phone]  [id number] - [place], [place] - [address] AT [facility] [address] [place] [address] Phone: [phone] Fax: [phone]     Social Determinants of Health (SDOH) Social History: SDOH Screenings   Food Insecurity: No Food Insecurity (11/24/2022)  Housing: Low Risk  (11/24/2022)  Transportation Needs: No Transportation Needs (11/24/2022)  Utilities: At Risk (11/24/2022)  Alcohol Screen: Low Risk  (11/03/2020)  Depression (PHQ2-9): Low Risk  (11/03/2020)  Financial Resource Strain: Low Risk  (11/03/2020)  Physical Activity: Inactive (11/03/2020)  Social Connections: Socially Isolated (11/03/2020)  Stress: No Stress Concern Present (11/03/2020)  Tobacco Use: Medium Risk (11/23/2022)   SDOH Interventions: Housing Interventions: Intervention Not Indicated   Readmission Risk Interventions     No data to display

## 2022-11-24 NOTE — Progress Notes (Signed)
PROGRESS NOTE    Steven [name]  GNF:[id number] DOB: 10/29/1950 DOA: 11/23/2022 PCP: Elfredia Nevins, MD   Brief Narrative:    Steven [name] is a 72 y.o. male with medical history significant of hyperthyroidism, hypertension, obesity, anxiety, OSA, prostate cancer s/p XRT who presents to the emergency department due to dizziness and weakness.  Patient complained of 3 week onset of dizziness and weakness which usually occurs when he goes from sitting to standing position.  He was admitted with hypertensive urgency as well as some orthostasis and was started on some IV fluid and continued on clonidine.  Assessment & Plan:   Principal Problem:   Hypertensive urgency Active Problems:   Hyperthyroidism   Personal history of malignant neoplasm of prostate   Orthostatic hypotension   Leukopenia  Assessment and Plan:   Hypertensive urgency-resolved Essential hypertension (uncontrolled) Continue clonidine and added back metoprolol 25 mg twice daily, monitor on telemetry for bradycardia Continue IV hydralazine 10 mg every 6 as needed for SBP > 170 Holding other home meds such as losartan and HCTZ Patient has been having difficulty in being able to control BP-he will require close outpatient cardiology follow-up   Orthostatic hypotension Patient complained of weakness and dizziness when he goes from sitting to standing position Orthostatic vital signs in the ED was positive Hold further IV hydration Repeat orthostatic vitals after hydration improved   Atypical chest pain Patient complained of nonreproducible sharp pain across the chest (from left to right) without radiation. EKG personally reviewed was done and showed normal sinus rhythm at a rate of 62 bpm with nonspecific T wave abnormalities Troponin x 2 - 16 > 17. Patient chest pain does not seem to be related to an acute cardiac event   Chronic leukopenia WBC 3.7, continue to monitor WBC with morning labs   SVT/paroxysmal  atrial flutter/paroxysmal A-fib Patient has prior history of atrial fibrillation EKG personally reviewed showed sinus bradycardia at a rate of 57 bpm with nonspecific T wave abnormalities Continue apixaban Metoprolol 25 mg twice daily resumed   Hyperthyroidism Continue methimazole Patient follows with Dr. Fransico Him (endocrinologist)   Prostate cancer/BPH Continue alfuzosin   IBS/diarrhea/constipation Stable.    DVT prophylaxis: Apixaban Code Status: Full Family Communication: Fianc at bedside 5/3 Disposition Plan:  Status is: Observation The patient will require care spanning > 2 midnights and should be moved to inpatient because: Need for close monitoring.   Consultants:  None  Procedures:  None  Antimicrobials:  None   Subjective: Patient seen and evaluated today with no new acute complaints or concerns. No acute concerns or events noted overnight.  He was noted to have some mild headache and chest pain earlier this morning which have now resolved.  Objective: Vitals:   11/24/22 0800 11/24/22 1000 11/24/22 1100 11/24/22 1110  BP:  (!) 111/56 (!) 101/48   Pulse:  69 61 72  Resp:  16 15 16   Temp: 98.1 F (36.7 C)     TempSrc: Oral     SpO2:  99% 98% 100%  Weight:      Height:        Intake/Output Summary (Last 24 hours) at 11/24/2022 1153 Last data filed at 11/24/2022 0900 Gross per 24 hour  Intake 326.09 ml  Output 1100 ml  Net -773.91 ml   Filed Weights   11/23/22 1748 11/24/22 0252 11/24/22 0455  Weight: 81.2 kg 78.4 kg 79.5 kg    Examination:  General exam: Appears calm and comfortable  Respiratory system:  Clear to auscultation. Respiratory effort normal. Cardiovascular system: S1 & S2 heard, RRR.  Gastrointestinal system: Abdomen is soft Central nervous system: Alert and awake Extremities: No edema Skin: No significant lesions noted Psychiatry: Flat affect.    Data Reviewed: I have personally reviewed following labs and imaging  studies  CBC: Recent Labs  Lab 11/23/22 1846 11/24/22 0421  WBC 3.7* 3.5*  NEUTROABS 2.1  --   HGB 14.2 13.0  HCT 41.7 38.9*  MCV 87.8 87.4  PLT 214 198   Basic Metabolic Panel: Recent Labs  Lab 11/23/22 1846 11/24/22 0421  NA 135 139  K 3.9 3.6  CL 101 105  CO2 26 25  GLUCOSE 115* 94  BUN 8 7*  CREATININE 0.89 0.74  CALCIUM 9.4 9.1  MG  --  1.9  PHOS  --  3.2   GFR: Estimated Creatinine Clearance: 85.7 mL/min (by C-G formula based on SCr of 0.74 mg/dL). Liver Function Tests: Recent Labs  Lab 11/24/22 0421  AST 14*  ALT 13  ALKPHOS 56  BILITOT 1.1  PROT 6.3*  ALBUMIN 3.5   No results for input(s): "LIPASE", "AMYLASE" in the last 168 hours. No results for input(s): "AMMONIA" in the last 168 hours. Coagulation Profile: No results for input(s): "INR", "PROTIME" in the last 168 hours. Cardiac Enzymes: No results for input(s): "CKTOTAL", "CKMB", "CKMBINDEX", "TROPONINI" in the last 168 hours. BNP (last 3 results) No results for input(s): "PROBNP" in the last 8760 hours. HbA1C: No results for input(s): "HGBA1C" in the last 72 hours. CBG: Recent Labs  Lab 11/24/22 1128  GLUCAP 117*   Lipid Profile: No results for input(s): "CHOL", "HDL", "LDLCALC", "TRIG", "CHOLHDL", "LDLDIRECT" in the last 72 hours. Thyroid Function Tests: No results for input(s): "TSH", "T4TOTAL", "FREET4", "T3FREE", "THYROIDAB" in the last 72 hours. Anemia Panel: No results for input(s): "VITAMINB12", "FOLATE", "FERRITIN", "TIBC", "IRON", "RETICCTPCT" in the last 72 hours. Sepsis Labs: No results for input(s): "PROCALCITON", "LATICACIDVEN" in the last 168 hours.  Recent Results (from the past 240 hour(s))  MRSA Next Gen by PCR, Nasal     Status: None   Collection Time: 11/24/22  2:21 AM   Specimen: Nasal Mucosa; Nasal Swab  Result Value Ref Range Status   MRSA by PCR Next Gen NOT DETECTED NOT DETECTED Final    Comment: (NOTE) The GeneXpert MRSA Assay (FDA approved for NASAL  specimens only), is one component of a comprehensive MRSA colonization surveillance program. It is not intended to diagnose MRSA infection nor to guide or monitor treatment for MRSA infections. Test performance is not FDA approved in patients less than 42 years old. Performed at [facility], [address] [place], [place] [address]          Radiology Studies: No results found.      Scheduled Meds:  alfuzosin  10 mg Oral Q breakfast   apixaban  5 mg Oral BID   Chlorhexidine Gluconate Cloth  6 each Topical Daily   cloNIDine  0.1 mg Oral BID   metoprolol tartrate  25 mg Oral BID     LOS: 0 days    Time spent: 35 minutes    Pistol Kessenich Hoover Brunette, DO [facility]  If 7PM-7AM, please contact night-coverage [email or web address] 11/24/2022, 11:53 AM

## 2022-11-25 ENCOUNTER — Encounter (HOSPITAL_COMMUNITY): Payer: Self-pay | Admitting: "Endocrinology

## 2022-11-25 DIAGNOSIS — I160 Hypertensive urgency: Secondary | ICD-10-CM | POA: Diagnosis not present

## 2022-11-25 LAB — RESPIRATORY PANEL BY PCR

## 2022-11-25 LAB — BASIC METABOLIC PANEL
Anion gap: 7 (ref 5–15)
BUN: 5 mg/dL — ABNORMAL LOW (ref 8–23)
CO2: 24 mmol/L (ref 22–32)
Calcium: 9 mg/dL (ref 8.9–10.3)
Chloride: 107 mmol/L (ref 98–111)
Creatinine, Ser: 0.87 mg/dL (ref 0.61–1.24)
GFR, Estimated: >60 mL/min (ref >60)
Glucose, Bld: 113 mg/dL — ABNORMAL HIGH (ref 70–99)
Potassium: 3.5 mmol/L (ref 3.5–5.1)
Sodium: 138 mmol/L (ref 135–145)

## 2022-11-25 LAB — CBC
HCT: 39.7 % (ref 39.0–52.0)
Hemoglobin: 13.4 g/dL (ref 13.0–17.0)
MCH: 29.6 pg (ref 26.0–34.0)
MCHC: 33.8 g/dL (ref 30.0–36.0)
MCV: 87.6 fL (ref 80.0–100.0)
Platelets: 198 10*3/uL (ref 150–400)
RBC: 4.53 MIL/uL (ref 4.22–5.81)
RDW: 12.8 % (ref 11.5–15.5)
WBC: 4.6 10*3/uL (ref 4.0–10.5)
nRBC: 0 % (ref 0.0–0.2)

## 2022-11-25 LAB — MAGNESIUM: Magnesium: 2 mg/dL (ref 1.7–2.4)

## 2022-11-25 LAB — SARS CORONAVIRUS 2 BY RT PCR: SARS Coronavirus 2 by RT PCR: NEGATIVE

## 2022-11-25 MED ORDER — DM-GUAIFENESIN ER 30-600 MG PO TB12
1.0000 | ORAL_TABLET | Freq: Two times a day (BID) | ORAL | Status: DC
  Administered 2022-11-25: 1 via ORAL
  Filled 2022-11-25 (×4): qty 1

## 2022-11-25 MED ORDER — FLUTICASONE PROPIONATE 50 MCG/ACT NA SUSP
2.0000 | Freq: Every day | NASAL | Status: DC
  Administered 2022-11-25 – 2022-11-27 (×3): 2 via NASAL
  Filled 2022-11-25 (×2): qty 16

## 2022-11-25 MED ORDER — LORATADINE 10 MG PO TABS
10.0000 mg | ORAL_TABLET | Freq: Every day | ORAL | Status: DC
  Administered 2022-11-25: 10 mg via ORAL
  Filled 2022-11-25 (×3): qty 1

## 2022-11-25 NOTE — TOC Progression Note (Signed)
Transition of Care [facility]) - Progression Note    Patient Details  Name: Steven [name] MRN: [id number] Date of Birth: [birth year]  [facility] of [facility]) CM/SW Contact  Catalina Gravel, LCSW Phone Number: 11/25/2022, 3:11 PM  Clinical Narrative:    Pt has new symptoms, possibly Flu or Covid, being tested. Will continue 1-2 days then DC.   Expected Discharge Plan: Home/Self Care Barriers to Discharge: Continued Medical Work up  Expected Discharge Plan and Services                                               Social Determinants of Health (SDOH) Interventions SDOH Screenings   Food Insecurity: No Food Insecurity (11/24/2022)  Housing: Low Risk  (11/24/2022)  Transportation Needs: No Transportation Needs (11/24/2022)  Utilities: At Risk (11/24/2022)  Alcohol Screen: Low Risk  (11/03/2020)  Depression (PHQ2-9): Low Risk  (11/03/2020)  Financial Resource Strain: Low Risk  (11/03/2020)  Physical Activity: Inactive (11/03/2020)  Social Connections: Socially Isolated (11/03/2020)  Stress: No Stress Concern Present (11/03/2020)  Tobacco Use: Medium Risk (11/23/2022)    Readmission Risk Interventions     No data to display

## 2022-11-25 NOTE — Progress Notes (Signed)
PROGRESS NOTE    BEARETT WACH  ZOX:[id number] DOB: [birth year] DOA: 11/23/2022 PCP: Elfredia Nevins, MD   Brief Narrative:    Steven [name] is a 72 y.o. male with medical history significant of hyperthyroidism, hypertension, obesity, anxiety, OSA, prostate cancer s/p XRT who presents to the emergency department due to dizziness and weakness.  Patient complained of 3 week onset of dizziness and weakness which usually occurs when he goes from sitting to standing position.  He was admitted with hypertensive urgency as well as some orthostasis and was started on some IV fluid and continued on clonidine.  Blood pressures are starting to stabilize, however he is complaining of some flulike symptoms today.  He is stable for transfer to telemetry.  Assessment & Plan:   Principal Problem:   Hypertensive urgency Active Problems:   Hyperthyroidism   Personal history of malignant neoplasm of prostate   Orthostatic hypotension   Leukopenia  Assessment and Plan:   Hypertensive urgency-resolved Essential hypertension (uncontrolled) Continue clonidine and added back metoprolol 25 mg twice daily, monitor on telemetry for bradycardia Continue IV hydralazine 10 mg every 6 as needed for SBP > 170 Holding other home meds such as losartan and HCTZ Patient has been having difficulty in being able to control BP-he will require close outpatient cardiology follow-up   Orthostatic hypotension Patient complained of weakness and dizziness when he goes from sitting to standing position Orthostatic vital signs in the ED was positive Hold further IV hydration Repeat orthostatic vitals after hydration improved   Atypical chest pain Patient complained of nonreproducible sharp pain across the chest (from left to right) without radiation. EKG personally reviewed was done and showed normal sinus rhythm at a rate of 62 bpm with nonspecific T wave abnormalities Troponin x 2 - 16 > 17. Patient chest pain does not  seem to be related to an acute cardiac event   Chronic leukopenia WBC 3.7, continue to monitor WBC with morning labs   SVT/paroxysmal atrial flutter/paroxysmal A-fib Patient has prior history of atrial fibrillation EKG personally reviewed showed sinus bradycardia at a rate of 57 bpm with nonspecific T wave abnormalities Continue apixaban Metoprolol 25 mg twice daily resumed   Hyperthyroidism Continue methimazole Patient follows with Dr. Fransico Him (endocrinologist)   Prostate cancer/BPH Continue alfuzosin   IBS/diarrhea/constipation Stable.    DVT prophylaxis: Apixaban Code Status: Full Family Communication: Fianc at bedside 5/4 Disposition Plan:  Status is: Inpatient Remains inpatient appropriate because: Need for IV medications.    Consultants:  None  Procedures:  None  Antimicrobials:  None   Subjective: Patient seen and evaluated today with no new acute complaints or concerns. No acute concerns or events noted overnight.  He was noted to have some mild headache and chest pain earlier this morning which have now resolved.  Objective: Vitals:   11/25/22 0700 11/25/22 0746 11/25/22 0823 11/25/22 1142  BP: (!) 125/57  (!) 161/77   Pulse: 72 83 86 77  Resp: 17 (!) 21  20  Temp:  100.1 F (37.8 C)  99.9 F (37.7 C)  TempSrc:  Oral  Oral  SpO2: 98% 99%  99%  Weight:      Height:        Intake/Output Summary (Last 24 hours) at 11/25/2022 1234 Last data filed at 11/25/2022 0800 Gross per 24 hour  Intake 120 ml  Output 1050 ml  Net -930 ml   Filed Weights   11/24/22 0252 11/24/22 0455 11/25/22 0527  Weight: 78.4 kg 79.5  kg 77.2 kg    Examination:  General exam: Appears calm and comfortable  Respiratory system: Clear to auscultation. Respiratory effort normal. Cardiovascular system: S1 & S2 heard, RRR.  Gastrointestinal system: Abdomen is soft Central nervous system: Alert and awake Extremities: No edema Skin: No significant lesions noted Psychiatry:  Flat affect.    Data Reviewed: I have personally reviewed following labs and imaging studies  CBC: Recent Labs  Lab 11/23/22 1846 11/24/22 0421 11/25/22 1200  WBC 3.7* 3.5* 4.6  NEUTROABS 2.1  --   --   HGB 14.2 13.0 13.4  HCT 41.7 38.9* 39.7  MCV 87.8 87.4 87.6  PLT 214 198 198   Basic Metabolic Panel: Recent Labs  Lab 11/23/22 1846 11/24/22 0421 11/25/22 1200  NA 135 139 138  K 3.9 3.6 3.5  CL 101 105 107  CO2 26 25 24   GLUCOSE 115* 94 113*  BUN 8 7* 5*  CREATININE 0.89 0.74 0.87  CALCIUM 9.4 9.1 9.0  MG  --  1.9 2.0  PHOS  --  3.2  --    GFR: Estimated Creatinine Clearance: 72.8 mL/min (by C-G formula based on SCr of 0.87 mg/dL). Liver Function Tests: Recent Labs  Lab 11/24/22 0421  AST 14*  ALT 13  ALKPHOS 56  BILITOT 1.1  PROT 6.3*  ALBUMIN 3.5   No results for input(s): "LIPASE", "AMYLASE" in the last 168 hours. No results for input(s): "AMMONIA" in the last 168 hours. Coagulation Profile: No results for input(s): "INR", "PROTIME" in the last 168 hours. Cardiac Enzymes: No results for input(s): "CKTOTAL", "CKMB", "CKMBINDEX", "TROPONINI" in the last 168 hours. BNP (last 3 results) No results for input(s): "PROBNP" in the last 8760 hours. HbA1C: No results for input(s): "HGBA1C" in the last 72 hours. CBG: Recent Labs  Lab 11/24/22 1128  GLUCAP 117*   Lipid Profile: No results for input(s): "CHOL", "HDL", "LDLCALC", "TRIG", "CHOLHDL", "LDLDIRECT" in the last 72 hours. Thyroid Function Tests: No results for input(s): "TSH", "T4TOTAL", "FREET4", "T3FREE", "THYROIDAB" in the last 72 hours. Anemia Panel: No results for input(s): "VITAMINB12", "FOLATE", "FERRITIN", "TIBC", "IRON", "RETICCTPCT" in the last 72 hours. Sepsis Labs: No results for input(s): "PROCALCITON", "LATICACIDVEN" in the last 168 hours.  Recent Results (from the past 240 hour(s))  MRSA Next Gen by PCR, Nasal     Status: None   Collection Time: 11/24/22  2:21 AM   Specimen:  Nasal Mucosa; Nasal Swab  Result Value Ref Range Status   MRSA by PCR Next Gen NOT DETECTED NOT DETECTED Final    Comment: (NOTE) The GeneXpert MRSA Assay (FDA approved for NASAL specimens only), is one component of a comprehensive MRSA colonization surveillance program. It is not intended to diagnose MRSA infection nor to guide or monitor treatment for MRSA infections. Test performance is not FDA approved in patients less than 42 years old. Performed at [facility], [address], [place], [place] [address]          Radiology Studies: No results found.      Scheduled Meds:  alfuzosin  10 mg Oral Q breakfast   apixaban  5 mg Oral BID   Chlorhexidine Gluconate Cloth  6 each Topical Daily   methimazole  10 mg Oral Daily   metoprolol tartrate  25 mg Oral BID     LOS: 1 day    Time spent: 35 minutes    Lelar Farewell Hoover Brunette, DO [facility]  If 7PM-7AM, please contact night-coverage [email or web address] 11/25/2022, 12:34 PM

## 2022-11-26 DIAGNOSIS — I160 Hypertensive urgency: Secondary | ICD-10-CM | POA: Diagnosis not present

## 2022-11-26 MED ORDER — GABAPENTIN 300 MG PO CAPS
300.0000 mg | ORAL_CAPSULE | Freq: Three times a day (TID) | ORAL | Status: DC
  Administered 2022-11-26 – 2022-11-27 (×4): 300 mg via ORAL
  Filled 2022-11-26 (×4): qty 1

## 2022-11-26 MED ORDER — LOSARTAN POTASSIUM 50 MG PO TABS: 100.0000 mg | ORAL_TABLET | Freq: Every day | ORAL | Status: DC

## 2022-11-26 MED ORDER — HYDRALAZINE HCL 25 MG PO TABS
25.0000 mg | ORAL_TABLET | Freq: Three times a day (TID) | ORAL | Status: DC
  Administered 2022-11-26 – 2022-11-27 (×3): 25 mg via ORAL
  Filled 2022-11-26 (×3): qty 1

## 2022-11-26 MED ORDER — LOSARTAN POTASSIUM 50 MG PO TABS
100.0000 mg | ORAL_TABLET | Freq: Every day | ORAL | Status: DC
  Administered 2022-11-26 – 2022-11-27 (×2): 100 mg via ORAL
  Filled 2022-11-26 (×2): qty 2

## 2022-11-26 MED ORDER — TRAZODONE HCL 50 MG PO TABS
50.0000 mg | ORAL_TABLET | Freq: Every day | ORAL | Status: DC
  Administered 2022-11-26: 50 mg via ORAL
  Filled 2022-11-26: qty 1

## 2022-11-26 MED ORDER — METOPROLOL TARTRATE 25 MG PO TABS: 25.0000 mg | ORAL_TABLET | Freq: Two times a day (BID) | ORAL | 0 refills | Status: DC

## 2022-11-26 MED ORDER — HYDROXYZINE HCL 10 MG PO TABS: 10.0000 mg | ORAL_TABLET | Freq: Three times a day (TID) | ORAL | 0 refills | Status: DC | PRN

## 2022-11-26 MED ORDER — FUROSEMIDE 20 MG PO TABS
20.0000 mg | ORAL_TABLET | Freq: Every day | ORAL | Status: DC
  Administered 2022-11-26 – 2022-11-27 (×2): 20 mg via ORAL
  Filled 2022-11-26 (×2): qty 1

## 2022-11-26 NOTE — Discharge Summary (Signed)
Physician Discharge Summary  Steven [name] WUJ:[id number] DOB: 1951-01-04 DOA: 11/23/2022  PCP: Elfredia Nevins, MD  Admit date: 11/23/2022  Discharge date: 11/27/2022  Admitted From:Home  Disposition:  Home  Recommendations for Outpatient Follow-up:  Follow up with PCP in 1-2 weeks Follow-up with cardiology as scheduled outpatient Continue on Atarax as needed for anxiety symptoms and follow-up with PCP Continue on blood pressure agents as noted below with metoprolol 25 mg twice daily, hydralazine, and losartan.  Hold clonidine and Lasix for now given some orthostasis related to this Continue other home medications as prior  Home Health: None  Equipment/Devices: None  Discharge Condition:Stable  CODE STATUS: Full  Diet recommendation: Heart Healthy  Brief/Interim Summary:  Steven [name] is a 72 y.o. male with medical history significant of hyperthyroidism, hypertension, obesity, anxiety, OSA, prostate cancer s/p XRT who presents to the emergency department due to dizziness and weakness.  Patient complained of 3 week onset of dizziness and weakness which usually occurs when he goes from sitting to standing position.  He was admitted with hypertensive urgency as well as some orthostasis and was started on some IV fluid and continued on clonidine.  His blood pressures were somewhat labile on admission, but have now stabilized and continue to remain somewhat elevated.  He is no longer orthostatic and may resume blood pressure medications as noted above.  His vital signs are otherwise stable and he continues to have some ongoing vague symptoms throughout his body that appear to be related to a form of anxiety.  He ruled out for any respiratory viral symptoms and is otherwise in stable condition for discharge with close follow-up outpatient recommended.  Discharge Diagnoses:  Principal Problem:   Hypertensive urgency Active Problems:   Hyperthyroidism   Personal history of malignant  neoplasm of prostate   Orthostatic hypotension   Leukopenia  Principal discharge diagnosis: Labile hypertension with hypertensive urgency and orthostasis with associated symptomatology exacerbated by anxiety.  Discharge Instructions  Discharge Instructions     Diet - low sodium heart healthy   Complete by: As directed    Diet - low sodium heart healthy   Complete by: As directed    Increase activity slowly   Complete by: As directed    Increase activity slowly   Complete by: As directed       Allergies as of 11/27/2022       Reactions   Latex Rash   Possible reaction to latex gloves per patient   Meclizine    Other reaction(s): Abdominal Pain, Other        Medication List     STOP taking these medications    potassium chloride SA 20 MEQ tablet Commonly known as: KLOR-CON M       TAKE these medications    acetaminophen 500 MG tablet Commonly known as: TYLENOL Take 500 mg by mouth every 6 (six) hours as needed for moderate pain.   alfuzosin 10 MG 24 hr tablet Commonly known as: UROXATRAL Take 1 tablet (10 mg total) by mouth daily with breakfast.   cloNIDine 0.1 MG tablet Commonly known as: CATAPRES Take 1 tablet (0.1 mg total) by mouth 2 (two) times daily as needed (SBP>160). What changed:  when to take this reasons to take this   Eliquis 5 MG Tabs tablet Generic drug: apixaban TAKE 1 TABLET(5 MG) BY MOUTH TWICE DAILY What changed: See the new instructions.   Emgality 120 MG/ML Soaj Generic drug: Galcanezumab-gnlm Inject 1 Pen into the skin every  30 (thirty) days.   Ergocalciferol 10 MCG (400 UNIT) Tabs Take by mouth.   furosemide 20 MG tablet Commonly known as: LASIX Take 1 tablet (20 mg total) by mouth daily. Take with or after meal.   gabapentin 300 MG capsule Commonly known as: NEURONTIN Take 300 mg by mouth 3 (three) times daily as needed.   hydrALAZINE 25 MG tablet Commonly known as: APRESOLINE Take 25 mg by mouth 3 (three) times  daily.   hydrOXYzine 10 MG tablet Commonly known as: ATARAX Take 1 tablet (10 mg total) by mouth 3 (three) times daily as needed for anxiety.   losartan 100 MG tablet Commonly known as: COZAAR Take 100 mg by mouth daily.   methimazole 10 MG tablet Commonly known as: TAPAZOLE Take 1 tablet (10 mg total) by mouth daily with breakfast.   metoprolol tartrate 25 MG tablet Commonly known as: LOPRESSOR Take 1 tablet (25 mg total) by mouth 2 (two) times daily. What changed:  medication strength how much to take   Nexletol 180 MG Tabs Generic drug: Bempedoic Acid Take 180 mg by mouth daily. Pt taking 2-3 times a week   Nurtec 75 MG Tbdp Generic drug: Rimegepant Sulfate DISSOLVE ONE TABLET BY MOUTH AS NEEDED   ondansetron 4 MG disintegrating tablet Commonly known as: ZOFRAN-ODT Take 1 tablet (4 mg total) by mouth every 8 (eight) hours as needed for nausea or vomiting.   pantoprazole 40 MG tablet Commonly known as: PROTONIX Take 1 tablet (40 mg total) by mouth daily.   polyethylene glycol 17 g packet Commonly known as: MIRALAX / GLYCOLAX Take 17 g by mouth daily.   PROBIOTIC-10 PO Take 1 capsule by mouth daily.   traZODone 50 MG tablet Commonly known as: DESYREL Take 50-150 mg by mouth at bedtime.   Ubrelvy 100 MG Tabs Generic drug: Ubrogepant Take 1 tablet (100 mg total) by mouth as needed (for headaches). May repeat a dose in 2 hours if needed. Max dose 2 pills in 24 hours   vitamin B-12 100 MCG tablet Commonly known as: CYANOCOBALAMIN Take 100 mcg by mouth daily.   Vitamin D3 125 MCG (5000 UT) Caps Take 1 capsule by mouth daily.        Follow-up Information     [facility] DSS Follow up.   Why: Contact to inquire about assistance with gas bill. Contact information: [phone]        [facility] Follow up.   Why: Contact to inquire about assistance with gas bill. Contact information: [phone]        Elfredia Nevins, MD. Schedule an  appointment as soon as possible for a visit in 1 week(s).   Specialty: Internal Medicine Contact information: [address] [place] [address] [phone]                Allergies  Allergen Reactions   Latex Rash    Possible reaction to latex gloves per patient   Meclizine     Other reaction(s): Abdominal Pain, Other    Consultations: None   Procedures/Studies: No results found.   Discharge Exam: Vitals:   11/27/22 0551 11/27/22 0848  BP: (!) 162/81 136/77  Pulse: 67 66  Resp: 18   Temp: 98.7 F (37.1 C)   SpO2: 96%    Vitals:   11/26/22 1532 11/26/22 2054 11/27/22 0551 11/27/22 0848  BP: (!) 170/74 (!) 173/79 (!) 162/81 136/77  Pulse: 64 74 67 66  Resp: 14 20 18    Temp: 99.3 F (  37.4 C) 99.4 F (37.4 C) 98.7 F (37.1 C)   TempSrc:   Oral   SpO2: 99% 97% 96%   Weight:      Height:        General: Pt is alert, awake, not in acute distress Cardiovascular: RRR, S1/S2 +, no rubs, no gallops Respiratory: CTA bilaterally, no wheezing, no rhonchi Abdominal: Soft, NT, ND, bowel sounds + Extremities: no edema, no cyanosis    The results of significant diagnostics from this hospitalization (including imaging, microbiology, ancillary and laboratory) are listed below for reference.     Microbiology: Recent Results (from the past 240 hour(s))  MRSA Next Gen by PCR, Nasal     Status: None   Collection Time: 11/24/22  2:21 AM   Specimen: Nasal Mucosa; Nasal Swab  Result Value Ref Range Status   MRSA by PCR Next Gen NOT DETECTED NOT DETECTED Final    Comment: (NOTE) The GeneXpert MRSA Assay (FDA approved for NASAL specimens only), is one component of a comprehensive MRSA colonization surveillance program. It is not intended to diagnose MRSA infection nor to guide or monitor treatment for MRSA infections. Test performance is not FDA approved in patients less than 21 years old. Performed at [facility], [address] [place], [place]  [address]   Respiratory (~20 pathogens) panel by PCR     Status: None   Collection Time: 11/25/22  8:48 AM   Specimen: Nasopharyngeal Swab; Respiratory  Result Value Ref Range Status   Adenovirus NOT DETECTED NOT DETECTED Final   Coronavirus 229E NOT DETECTED NOT DETECTED Final    Comment: (NOTE) The Coronavirus on the Respiratory Panel, DOES NOT test for the novel  Coronavirus (2019 nCoV)    Coronavirus HKU1 NOT DETECTED NOT DETECTED Final   Coronavirus NL63 NOT DETECTED NOT DETECTED Final   Coronavirus OC43 NOT DETECTED NOT DETECTED Final   Metapneumovirus NOT DETECTED NOT DETECTED Final   Rhinovirus / Enterovirus NOT DETECTED NOT DETECTED Final   Influenza A NOT DETECTED NOT DETECTED Final   Influenza B NOT DETECTED NOT DETECTED Final   Parainfluenza Virus 1 NOT DETECTED NOT DETECTED Final   Parainfluenza Virus 2 NOT DETECTED NOT DETECTED Final   Parainfluenza Virus 3 NOT DETECTED NOT DETECTED Final   Parainfluenza Virus 4 NOT DETECTED NOT DETECTED Final   Respiratory Syncytial Virus NOT DETECTED NOT DETECTED Final   Bordetella pertussis NOT DETECTED NOT DETECTED Final   Bordetella Parapertussis NOT DETECTED NOT DETECTED Final   Chlamydophila pneumoniae NOT DETECTED NOT DETECTED Final   Mycoplasma pneumoniae NOT DETECTED NOT DETECTED Final    Comment: Performed at [facility], [address], [place], [place] [address]  SARS Coronavirus 2 by RT PCR (hospital order, performed in [facility] hospital lab) *cepheid single result test*     Status: None   Collection Time: 11/25/22  2:30 PM  Result Value Ref Range Status   SARS Coronavirus 2 by RT PCR NEGATIVE NEGATIVE Final    Comment: (NOTE) SARS-CoV-2 target nucleic acids are NOT DETECTED.  The SARS-CoV-2 RNA is generally detectable in upper and lower respiratory specimens during the acute phase of infection. The lowest concentration of SARS-CoV-2 viral copies this assay can detect is 250 copies / mL. A negative result does  not preclude SARS-CoV-2 infection and should not be used as the sole basis for treatment or other patient management decisions.  A negative result may occur with improper specimen collection / handling, submission of specimen other than nasopharyngeal  swab, presence of viral mutation(s) within the areas targeted by this assay, and inadequate number of viral copies (<250 copies / mL). A negative result must be combined with clinical observations, patient history, and epidemiological information.  Fact Sheet for Patients:   RoadLapTop.co.za  Fact Sheet for Healthcare Providers: http://kim-miller.com/  This test is not yet approved or  cleared by the [place] FDA and has been authorized for detection and/or diagnosis of SARS-CoV-2 by FDA under an Emergency Use Authorization (EUA).  This EUA will remain in effect (meaning this test can be used) for the duration of the COVID-19 declaration under Section 564(b)(1) of the Act, 21 U.S.C. section 360bbb-3(b)(1), unless the authorization is terminated or revoked sooner.  Performed at [facility], [address], [place], [place] [address]      Labs: BNP (last 3 results) Recent Labs    08/01/22 0827 08/13/22 0347  BNP 7.0 8.0   Basic Metabolic Panel: Recent Labs  Lab 11/23/22 1846 11/24/22 0421 11/25/22 1200  NA 135 139 138  K 3.9 3.6 3.5  CL 101 105 107  CO2 26 25 24   GLUCOSE 115* 94 113*  BUN 8 7* 5*  CREATININE 0.89 0.74 0.87  CALCIUM 9.4 9.1 9.0  MG  --  1.9 2.0  PHOS  --  3.2  --    Liver Function Tests: Recent Labs  Lab 11/24/22 0421  AST 14*  ALT 13  ALKPHOS 56  BILITOT 1.1  PROT 6.3*  ALBUMIN 3.5   No results for input(s): "LIPASE", "AMYLASE" in the last 168 hours. No results for input(s): "AMMONIA" in the last 168 hours. CBC: Recent Labs  Lab 11/23/22 1846 11/24/22 0421 11/25/22 1200  WBC 3.7* 3.5* 4.6  NEUTROABS 2.1  --   --   HGB 14.2 13.0 13.4   HCT 41.7 38.9* 39.7  MCV 87.8 87.4 87.6  PLT 214 198 198   Cardiac Enzymes: No results for input(s): "CKTOTAL", "CKMB", "CKMBINDEX", "TROPONINI" in the last 168 hours. BNP: Invalid input(s): "POCBNP" CBG: Recent Labs  Lab 11/24/22 1128  GLUCAP 117*   D-Dimer No results for input(s): "DDIMER" in the last 72 hours. Hgb A1c No results for input(s): "HGBA1C" in the last 72 hours. Lipid Profile No results for input(s): "CHOL", "HDL", "LDLCALC", "TRIG", "CHOLHDL", "LDLDIRECT" in the last 72 hours. Thyroid function studies No results for input(s): "TSH", "T4TOTAL", "T3FREE", "THYROIDAB" in the last 72 hours.  Invalid input(s): "FREET3" Anemia work up No results for input(s): "VITAMINB12", "FOLATE", "FERRITIN", "TIBC", "IRON", "RETICCTPCT" in the last 72 hours. Urinalysis    Component Value Date/Time   COLORURINE YELLOW 09/13/2022 1645   APPEARANCEUR Clear 10/17/2022 1640   LABSPEC 1.011 09/13/2022 1645   PHURINE 7.0 09/13/2022 1645   GLUCOSEU Negative 10/17/2022 1640   HGBUR NEGATIVE 09/13/2022 1645   BILIRUBINUR Negative 10/17/2022 1640   KETONESUR NEGATIVE 09/13/2022 1645   PROTEINUR Negative 10/17/2022 1640   PROTEINUR NEGATIVE 09/13/2022 1645   UROBILINOGEN negative (A) 12/12/2019 1113   UROBILINOGEN 1.0 08/03/2012 1156   NITRITE Negative 10/17/2022 1640   NITRITE NEGATIVE 09/13/2022 1645   LEUKOCYTESUR Negative 10/17/2022 1640   LEUKOCYTESUR NEGATIVE 09/13/2022 1645   Sepsis Labs Recent Labs  Lab 11/23/22 1846 11/24/22 0421 11/25/22 1200  WBC 3.7* 3.5* 4.6   Microbiology Recent Results (from the past 240 hour(s))  MRSA Next Gen by PCR, Nasal     Status: None   Collection Time: 11/24/22  2:21 AM   Specimen: Nasal Mucosa; Nasal Swab  Result  Value Ref Range Status   MRSA by PCR Next Gen NOT DETECTED NOT DETECTED Final    Comment: (NOTE) The GeneXpert MRSA Assay (FDA approved for NASAL specimens only), is one component of a comprehensive MRSA colonization  surveillance program. It is not intended to diagnose MRSA infection nor to guide or monitor treatment for MRSA infections. Test performance is not FDA approved in patients less than 5 years old. Performed at [facility], [address], [place], [place] [address]   Respiratory (~20 pathogens) panel by PCR     Status: None   Collection Time: 11/25/22  8:48 AM   Specimen: Nasopharyngeal Swab; Respiratory  Result Value Ref Range Status   Adenovirus NOT DETECTED NOT DETECTED Final   Coronavirus 229E NOT DETECTED NOT DETECTED Final    Comment: (NOTE) The Coronavirus on the Respiratory Panel, DOES NOT test for the novel  Coronavirus (2019 nCoV)    Coronavirus HKU1 NOT DETECTED NOT DETECTED Final   Coronavirus NL63 NOT DETECTED NOT DETECTED Final   Coronavirus OC43 NOT DETECTED NOT DETECTED Final   Metapneumovirus NOT DETECTED NOT DETECTED Final   Rhinovirus / Enterovirus NOT DETECTED NOT DETECTED Final   Influenza A NOT DETECTED NOT DETECTED Final   Influenza B NOT DETECTED NOT DETECTED Final   Parainfluenza Virus 1 NOT DETECTED NOT DETECTED Final   Parainfluenza Virus 2 NOT DETECTED NOT DETECTED Final   Parainfluenza Virus 3 NOT DETECTED NOT DETECTED Final   Parainfluenza Virus 4 NOT DETECTED NOT DETECTED Final   Respiratory Syncytial Virus NOT DETECTED NOT DETECTED Final   Bordetella pertussis NOT DETECTED NOT DETECTED Final   Bordetella Parapertussis NOT DETECTED NOT DETECTED Final   Chlamydophila pneumoniae NOT DETECTED NOT DETECTED Final   Mycoplasma pneumoniae NOT DETECTED NOT DETECTED Final    Comment: Performed at [facility], [address], [place], [place] [address]  SARS Coronavirus 2 by RT PCR (hospital order, performed in [facility] hospital lab) *cepheid single result test*     Status: None   Collection Time: 11/25/22  2:30 PM  Result Value Ref Range Status   SARS Coronavirus 2 by RT PCR NEGATIVE NEGATIVE Final    Comment: (NOTE) SARS-CoV-2 target nucleic acids  are NOT DETECTED.  The SARS-CoV-2 RNA is generally detectable in upper and lower respiratory specimens during the acute phase of infection. The lowest concentration of SARS-CoV-2 viral copies this assay can detect is 250 copies / mL. A negative result does not preclude SARS-CoV-2 infection and should not be used as the sole basis for treatment or other patient management decisions.  A negative result may occur with improper specimen collection / handling, submission of specimen other than nasopharyngeal swab, presence of viral mutation(s) within the areas targeted by this assay, and inadequate number of viral copies (<250 copies / mL). A negative result must be combined with clinical observations, patient history, and epidemiological information.  Fact Sheet for Patients:   RoadLapTop.co.za  Fact Sheet for Healthcare Providers: http://kim-miller.com/  This test is not yet approved or  cleared by the [place] FDA and has been authorized for detection and/or diagnosis of SARS-CoV-2 by FDA under an Emergency Use Authorization (EUA).  This EUA will remain in effect (meaning this test can be used) for the duration of the COVID-19 declaration under Section 564(b)(1) of the Act, 21 U.S.C. section 360bbb-3(b)(1), unless the authorization is terminated or revoked sooner.  Performed at [facility], [address], [place], [place] [address]  Time coordinating discharge: 35 minutes  SIGNED:   Erick Blinks, DO [facility] 11/27/2022, 8:59 AM  If 7PM-7AM, please contact night-coverage [email or web address]

## 2022-11-26 NOTE — Progress Notes (Signed)
Patient stated, " I think the medication that Im taking is making me sick, I feel worse after the pills that you gave me at 10pm".  I explained to the patient that the medications that I gave at 2200 were the same medications that he takes at home with the exception of mucinex dm. The patient also stated, "I am fatigued and weak, and my legs hurt all over and I'm nauseated". Patient was able to get up and walk to the bathroom with no issues.  I checked the patient's vital signs and his blood pressure is elevated 184/84. I gave the patient 2 prn medication. MD on call notified. Plan of care ongoing.

## 2022-11-27 MED ORDER — CLONIDINE HCL 0.1 MG PO TABS: 0.1000 mg | ORAL_TABLET | Freq: Two times a day (BID) | ORAL | 11 refills | Status: DC | PRN

## 2022-11-27 NOTE — Progress Notes (Signed)
Patient seen and evaluated this morning and was kept overnight on account of elevated blood pressure to 190 systolic.  Please refer to discharge summary dictated 5/5 for full details.  Given that his blood pressures do remain elevated in the 160-170 systolic range, I have recommended that he start on clonidine as needed for systolic blood pressure elevations above 160.  This should temporarily aid in controlling blood pressures until further medication adjustments can be achieved.  He unfortunately has a dramatic drop in blood pressure readings if clonidine is scheduled.  I believe much of his symptoms are also related to anxiety and Atarax has also been prescribed as noted in discharge summary.  He is currently in stable condition for discharge today and has been reassured that some elevation in his blood pressure is more favorable than having hypotension.  Total care time: 30 minutes.

## 2022-11-27 NOTE — Consult Note (Signed)
[facility] Customer service manager [facility]) Accountable Care Organization (ACO) [facility] Liaison Note  11/27/2022  Steven [name] October 25, 1950 [id number]  Location: [facility] RN Hospital Liaison screened the patient remotely at [facility].  Insurance: [employer]   Steven [name] is a 72 y.o. male who is a Primary Care Patient of Elfredia Nevins, MD [facility].  The patient was screened for readmission hospitalization with noted extreme risk score for unplanned readmission risk with 3 IP/18 ED in 6 months.  The patient was assessed for potential [facility]) Care Management service needs for post hospital transition for care coordination. Review of patient's electronic medical record reveals patient is discharged home today with no additional needs. [facility] liaison was unsuccessful with contacts made to pt and girl-friend noted via DPR today for [facility] introductions of services.   Plan: [facility] Liaison will continue to follow progress and disposition to asess for post hospital community care coordination/management needs.  Referral request for community care coordination: Referral made to [facility] care coordination.   [facility] Care Management/Population Health does not replace or interfere with any arrangements made by the Inpatient Transition of Care team.   For questions contact:   Elliot Cousin, RN, BSN [facility] Liaison [place] Antonia   [facility]  Population Health Office Hours MTWF 8:00 am to 6 pm off on Thursday [phone] mobile [phone] [Office toll free line][facility] Office Hours are M-F 8:30 - 5 pm 24 hour nurse advise line [phone] Conceirge  [email or web address]

## 2022-11-27 NOTE — Progress Notes (Signed)
Patient slept through the night, no complaints of pain. Continued to monitor.  

## 2022-11-27 NOTE — Progress Notes (Signed)
Mobility Specialist Progress Note:    11/27/22 0956  Mobility  Activity Ambulated independently in room  Level of Assistance Independent  Assistive Device None  Distance Ambulated (ft) 10 ft  Activity Response Tolerated well  Mobility Referral Yes  $Mobility charge 1 Mobility   Pt received ambulating independently in room, eager for d/c. Tolerated mobility well, asx throughout. Left pt with all needs met, call bell in reach.   [name] Mobility Specialist Please contact via Special educational needs teacher or  Rehab office at [phone]

## 2022-11-28 ENCOUNTER — Telehealth: Payer: Self-pay

## 2022-11-28 DIAGNOSIS — I10 Essential (primary) hypertension: Secondary | ICD-10-CM

## 2022-11-28 DIAGNOSIS — I482 Chronic atrial fibrillation, unspecified: Secondary | ICD-10-CM

## 2022-11-29 ENCOUNTER — Telehealth: Payer: Self-pay | Admitting: Internal Medicine

## 2022-11-29 ENCOUNTER — Telehealth: Payer: Self-pay | Admitting: *Deleted

## 2022-11-29 ENCOUNTER — Ambulatory Visit: Payer: 59 | Admitting: Gastroenterology

## 2022-11-29 DIAGNOSIS — I4891 Unspecified atrial fibrillation: Secondary | ICD-10-CM | POA: Diagnosis not present

## 2022-11-29 DIAGNOSIS — C61 Malignant neoplasm of prostate: Secondary | ICD-10-CM | POA: Diagnosis not present

## 2022-11-29 DIAGNOSIS — Z6829 Body mass index (BMI) 29.0-29.9, adult: Secondary | ICD-10-CM | POA: Diagnosis not present

## 2022-11-29 DIAGNOSIS — I10 Essential (primary) hypertension: Secondary | ICD-10-CM | POA: Diagnosis not present

## 2022-11-29 DIAGNOSIS — E875 Hyperkalemia: Secondary | ICD-10-CM | POA: Diagnosis not present

## 2022-11-29 DIAGNOSIS — E663 Overweight: Secondary | ICD-10-CM | POA: Diagnosis not present

## 2022-11-29 DIAGNOSIS — I700 Atherosclerosis of aorta: Secondary | ICD-10-CM | POA: Diagnosis not present

## 2022-11-29 NOTE — Telephone Encounter (Signed)
Calling with concerns about his ablation. States that he is having bp issues and what to discuss things with the nurse. Please advise

## 2022-11-29 NOTE — Progress Notes (Signed)
  Care Coordination   Note   11/29/2022 Name: [name] MRN: [id number] DOB: 1950-12-09  TAILOR LUDEN is a 72 y.o. year old male who sees Elfredia Nevins, MD for primary care. I reached out to [name] by phone today to offer care coordination services.  Mr. Gerren was given information about Care Coordination services today including:   The Care Coordination services include support from the care team which includes your Nurse Coordinator, Clinical Social Worker, or Pharmacist.  The Care Coordination team is here to help remove barriers to the health concerns and goals most important to you. Care Coordination services are voluntary, and the patient may decline or stop services at any time by request to their care team member.   Care Coordination Consent Status: Patient agreed to services and verbal consent obtained.   Follow up plan:  Telephone appointment with care coordination team member scheduled for:  12/05/22  Encounter Outcome:  Pt. Scheduled  [facility] Coordination Care Guide  Direct Dial: [phone]

## 2022-11-29 NOTE — Telephone Encounter (Signed)
Patient stated he was admitted to the hospital on 5/2 for orthostatic hypotension. Today he has a f/u with PCP. Pt stated this morning he was not feeling well nausea and fatigue bp 189/94 this was before he took his morning medications. While on the phone pt did recheck bp 138/75 and is currently asymptomatic. Pt is scheduled for ablation on 5/21, his scheduled for a blood drawn on  5/10 and pt will like to know since he was admitted does he still have to keep this appointment. Pt has 3 f/u appointment with Dr. Eden Emms on 5/13. Explained Ed precautions, pt voiced understanding. Will forward to MD and nurse for advise.

## 2022-11-30 NOTE — Telephone Encounter (Signed)
Per telephone note from Dr. Eden Emms, appointment in [place] with Dr. Nonda Lou.  Steven [name] made aware of this.    Steven [name] wanted Dr. Ladona Ridgel to know about his recent BP concerns.  Steven [name] wanted to know if he should move forward with SVT Ablation.    Steven [name] called back and advised that YES, Dr. Ladona Ridgel still wants to do the SVT Ablation on 5/21, and Steven [name] should have labs drawn tomorrow; 5/[address].  Steven [name] advised higher BP ok with SVT Ablation per Dr. Ladona Ridgel.    Dr. Ladona Ridgel and team will assess his VS the day of his procedure as well.    Steven [name] verbalized understanding, and stated he would try to come in for lab draw tomorrow, and understood appt with Dr. Eden Emms was cancelled.

## 2022-11-30 NOTE — Telephone Encounter (Signed)
Patient called again regarding to follow-up on clearance for upcoming surgery.

## 2022-12-01 ENCOUNTER — Telehealth: Payer: Self-pay | Admitting: Internal Medicine

## 2022-12-01 ENCOUNTER — Ambulatory Visit: Payer: Medicare HMO | Attending: Cardiovascular Disease

## 2022-12-01 DIAGNOSIS — Z01812 Encounter for preprocedural laboratory examination: Secondary | ICD-10-CM

## 2022-12-01 NOTE — Telephone Encounter (Signed)
Patient came in for lab work today but says his new insurance starts in June and he would like to push his ablation out so that it will be covered under his new ins. As of now he was quoted 4 grand out of pocket with his current insurance and he cannot afford that.  Please call [phone] to speak with him  Thank you!

## 2022-12-04 ENCOUNTER — Telehealth: Payer: Self-pay | Admitting: Internal Medicine

## 2022-12-04 ENCOUNTER — Other Ambulatory Visit: Payer: Self-pay

## 2022-12-04 ENCOUNTER — Emergency Department (HOSPITAL_COMMUNITY)
Admission: EM | Admit: 2022-12-04 | Discharge: 2022-12-04 | Disposition: A | Discharge status: Home / Self Care | Payer: Medicare HMO | Attending: Emergency Medicine | Admitting: Emergency Medicine

## 2022-12-04 ENCOUNTER — Ambulatory Visit: Payer: 59 | Admitting: Cardiovascular Disease

## 2022-12-04 ENCOUNTER — Encounter (HOSPITAL_COMMUNITY): Payer: Self-pay

## 2022-12-04 ENCOUNTER — Emergency Department (HOSPITAL_COMMUNITY): Payer: Medicare HMO

## 2022-12-04 DIAGNOSIS — Z20822 Contact with and (suspected) exposure to covid-19: Secondary | ICD-10-CM | POA: Insufficient documentation

## 2022-12-04 DIAGNOSIS — R001 Bradycardia, unspecified: Secondary | ICD-10-CM | POA: Insufficient documentation

## 2022-12-04 DIAGNOSIS — Z8546 Personal history of malignant neoplasm of prostate: Secondary | ICD-10-CM | POA: Diagnosis not present

## 2022-12-04 DIAGNOSIS — Z7901 Long term (current) use of anticoagulants: Secondary | ICD-10-CM | POA: Diagnosis not present

## 2022-12-04 DIAGNOSIS — E876 Hypokalemia: Secondary | ICD-10-CM | POA: Diagnosis not present

## 2022-12-04 DIAGNOSIS — Z91040 Latex allergy status: Secondary | ICD-10-CM | POA: Insufficient documentation

## 2022-12-04 DIAGNOSIS — I10 Essential (primary) hypertension: Secondary | ICD-10-CM | POA: Diagnosis not present

## 2022-12-04 DIAGNOSIS — R079 Chest pain, unspecified: Secondary | ICD-10-CM | POA: Diagnosis not present

## 2022-12-04 DIAGNOSIS — Z79899 Other long term (current) drug therapy: Secondary | ICD-10-CM | POA: Diagnosis not present

## 2022-12-04 LAB — BASIC METABOLIC PANEL
Anion gap: 6 (ref 5–15)
BUN: 6 mg/dL — ABNORMAL LOW (ref 8–23)
CO2: 28 mmol/L (ref 22–32)
Calcium: 9.5 mg/dL (ref 8.9–10.3)
Chloride: 106 mmol/L (ref 98–111)
Creatinine, Ser: 0.89 mg/dL (ref 0.61–1.24)
GFR, Estimated: >60 mL/min (ref >60)
Glucose, Bld: 92 mg/dL (ref 70–99)
Potassium: 3.1 mmol/L — ABNORMAL LOW (ref 3.5–5.1)
Sodium: 140 mmol/L (ref 135–145)

## 2022-12-04 LAB — MAGNESIUM: Magnesium: 2.1 mg/dL (ref 1.7–2.4)

## 2022-12-04 LAB — CBC
HCT: 37.7 % — ABNORMAL LOW (ref 39.0–52.0)
Hemoglobin: 12.7 g/dL — ABNORMAL LOW (ref 13.0–17.0)
MCH: 30.1 pg (ref 26.0–34.0)
MCHC: 33.7 g/dL (ref 30.0–36.0)
MCV: 89.3 fL (ref 80.0–100.0)
Platelets: 199 10*3/uL (ref 150–400)
RBC: 4.22 MIL/uL (ref 4.22–5.81)
RDW: 13.1 % (ref 11.5–15.5)
WBC: 3 10*3/uL — ABNORMAL LOW (ref 4.0–10.5)
nRBC: 0 % (ref 0.0–0.2)

## 2022-12-04 LAB — TROPONIN I (HIGH SENSITIVITY)
Troponin I (High Sensitivity): 6 ng/L (ref <18)
Troponin I (High Sensitivity): 5 ng/L (ref <18)

## 2022-12-04 LAB — SARS CORONAVIRUS 2 BY RT PCR: SARS Coronavirus 2 by RT PCR: NEGATIVE

## 2022-12-04 MED ORDER — POTASSIUM CHLORIDE CRYS ER 20 MEQ PO TBCR
40.0000 meq | EXTENDED_RELEASE_TABLET | Freq: Once | ORAL | Status: AC
  Administered 2022-12-04: 40 meq via ORAL
  Filled 2022-12-04: qty 2

## 2022-12-04 MED ORDER — POTASSIUM CHLORIDE CRYS ER 20 MEQ PO TBCR: 20.0000 meq | EXTENDED_RELEASE_TABLET | Freq: Every day | ORAL | 0 refills | Status: AC

## 2022-12-04 MED ORDER — METOPROLOL TARTRATE 25 MG PO TABS: 12.5000 mg | ORAL_TABLET | Freq: Two times a day (BID) | ORAL | 0 refills | Status: DC

## 2022-12-04 NOTE — ED Provider Notes (Signed)
[place] EMERGENCY DEPARTMENT AT [facility] Provider Note   CSN: [id number] Arrival date & time: 12/04/22  0825     History  Chief Complaint  Patient presents with   Hypertension    Steven [name] is a 72 y.o. male.  Pt is a 72 yo male with a pmhx significant for htn, arthritis, afib/SVT, hld, gerd, kidney stones, hyperthyroidism, prostate cancer, migraines, and ibs.  Pt was admitted from 5/2-5/6 for htn and orthostatic hypotension.  He is scheduled for an ablation of SVT on 5/21.  Pt said he felt dizzy this am after having a bowel movement.  He had some cp and felt nauseous.  He is feeling better now.       Home Medications Prior to Admission medications   Medication Sig Start Date End Date Taking? Authorizing Provider  acetaminophen (TYLENOL) 500 MG tablet Take 500 mg by mouth every 6 (six) hours as needed for moderate pain.   Yes [provider]  alfuzosin (UROXATRAL) 10 MG 24 hr tablet Take 1 tablet (10 mg total) by mouth daily with breakfast. 04/07/22  Yes [name], Mardene Celeste, MD  Cholecalciferol (VITAMIN D3) 125 MCG (5000 UT) CAPS Take 1 capsule by mouth daily.    Yes [provider]  cloNIDine (CATAPRES) 0.1 MG tablet Take 1 tablet (0.1 mg total) by mouth 2 (two) times daily as needed (SBP>160). 11/27/22  Yes Shah, Pratik D, DO  ELIQUIS 5 MG TABS tablet TAKE 1 TABLET(5 MG) BY MOUTH TWICE DAILY Patient taking differently: Take 5 mg by mouth 2 (two) times daily. 10/18/22  Yes Wendall Stade, MD  furosemide (LASIX) 20 MG tablet Take 1 tablet (20 mg total) by mouth daily. Take with or after meal. 11/07/22  Yes Marinus Maw, MD  gabapentin (NEURONTIN) 300 MG capsule Take 300 mg by mouth 3 (three) times daily as needed (nerve pain). 02/24/20  Yes [provider]  Galcanezumab-gnlm (EMGALITY) 120 MG/ML SOAJ Inject 1 Pen into the skin every 30 (thirty) days. 09/25/22  Yes Ocie Doyne, MD  hydrALAZINE (APRESOLINE) 25 MG tablet Take 25 mg by  mouth 3 (three) times daily. 10/09/22  Yes [provider]  hydrOXYzine (ATARAX) 10 MG tablet Take 1 tablet (10 mg total) by mouth 3 (three) times daily as needed for anxiety. 11/26/22  Yes Shah, Pratik D, DO  losartan (COZAAR) 100 MG tablet Take 100 mg by mouth daily.   Yes [provider]  methimazole (TAPAZOLE) 10 MG tablet Take 1 tablet (10 mg total) by mouth daily with breakfast. 10/16/22  Yes Nida, Denman George, MD  NURTEC 75 MG TBDP Take 75 mg by mouth daily as needed (pain). 08/14/22  Yes [provider]  ondansetron (ZOFRAN-ODT) 4 MG disintegrating tablet Take 1 tablet (4 mg total) by mouth every 8 (eight) hours as needed for nausea or vomiting. 08/23/22  Yes Vassie Loll, MD  polyethylene glycol (MIRALAX / GLYCOLAX) 17 g packet Take 17 g by mouth daily. 08/23/22  Yes Vassie Loll, MD  Probiotic Product (PROBIOTIC-10 PO) Take 1 capsule by mouth daily.   Yes [provider]  traZODone (DESYREL) 50 MG tablet Take 50-150 mg by mouth at bedtime. 11/14/22  Yes [provider]  Ubrogepant (UBRELVY) 100 MG TABS Take 1 tablet (100 mg total) by mouth as needed (for headaches). May repeat a dose in 2 hours if needed. Max dose 2 pills in 24 hours 09/25/22  Yes Chima, Victorino Dike, MD  vitamin B-12 (CYANOCOBALAMIN) 100 MCG  tablet Take 100 mcg by mouth daily.   Yes [provider]  metoprolol tartrate (LOPRESSOR) 25 MG tablet Take 0.5 tablets (12.5 mg total) by mouth 2 (two) times daily. 12/04/22 01/03/23  Jacalyn Lefevre, MD  potassium chloride SA (KLOR-CON M) 20 MEQ tablet Take 1 tablet (20 mEq total) by mouth daily. 12/04/22   Jacalyn Lefevre, MD      Allergies    Latex and Meclizine    Review of Systems   Review of Systems  Gastrointestinal:  Positive for nausea.  Neurological:  Positive for dizziness and weakness.  All other systems reviewed and are negative.   Physical Exam Updated Vital Signs BP 135/71   Pulse (!) 46   Temp 97.8 F (36.6 C)  (Oral)   Resp 15   Ht 5\' 7"  (1.702 m)   Wt 82.1 kg   SpO2 100%   BMI 28.35 kg/m  Physical Exam Vitals and nursing note reviewed.  Constitutional:      Appearance: Normal appearance.  HENT:     Head: Normocephalic and atraumatic.     Right Ear: External ear normal.     Left Ear: External ear normal.     Nose: Nose normal.     Mouth/Throat:     Mouth: Mucous membranes are moist.     Pharynx: Oropharynx is clear.  Eyes:     Extraocular Movements: Extraocular movements intact.     Conjunctiva/sclera: Conjunctivae normal.     Pupils: Pupils are equal, round, and reactive to light.  Cardiovascular:     Rate and Rhythm: Regular rhythm. Bradycardia present.     Pulses: Normal pulses.     Heart sounds: Normal heart sounds.  Pulmonary:     Effort: Pulmonary effort is normal.     Breath sounds: Normal breath sounds.  Abdominal:     General: Abdomen is flat. Bowel sounds are normal.     Palpations: Abdomen is soft.  Musculoskeletal:        General: Normal range of motion.     Cervical back: Normal range of motion and neck supple.  Skin:    General: Skin is warm.     Capillary Refill: Capillary refill takes less than 2 seconds.  Neurological:     General: No focal deficit present.     Mental Status: He is alert and oriented to person, place, and time.  Psychiatric:        Mood and Affect: Mood normal.        Behavior: Behavior normal.     ED Results / Procedures / Treatments   Labs (all labs ordered are listed, but only abnormal results are displayed) Labs Reviewed  BASIC METABOLIC PANEL - Abnormal; Notable for the following components:      Result Value   Potassium 3.1 (*)    BUN 6 (*)    All other components within normal limits  CBC - Abnormal; Notable for the following components:   WBC 3.0 (*)    Hemoglobin 12.7 (*)    HCT 37.7 (*)    All other components within normal limits  SARS CORONAVIRUS 2 BY RT PCR  MAGNESIUM  TROPONIN I (HIGH SENSITIVITY)  TROPONIN I  (HIGH SENSITIVITY)    EKG EKG Interpretation  Date/Time:  Monday Dec 04 2022 08:40:18 EDT Ventricular Rate:  60 PR Interval:  150 QRS Duration: 92 QT Interval:  418 QTC Calculation: 418 R Axis:   -14 Text Interpretation: Sinus rhythm Low voltage, precordial leads Left ventricular hypertrophy  Anterior Q waves, possibly due to LVH Nonspecific T abnormalities, inferior leads No significant change since last tracing Confirmed by Jacalyn Lefevre [id number]) on 12/04/2022 11:17:24 AM  Radiology DG Chest Port 1 View  Result Date: 12/04/2022 CLINICAL DATA:  Chest pain EXAM: PORTABLE CHEST 1 VIEW COMPARISON:  Previous studies including the examination of 09/30/2022 FINDINGS: Transverse diameter of heart is slightly increased. There are no signs of pulmonary edema or focal pulmonary consolidation. Blunting of left lateral CP angle has not changed suggesting possible pleural thickening. There is no pneumothorax. IMPRESSION: There are no new infiltrates or signs of pulmonary edema. Electronically Signed   By: Ernie Avena M.D.   On: 12/04/2022 09:07    Procedures Procedures    Medications Ordered in ED Medications  potassium chloride SA (KLOR-CON M) CR tablet 40 mEq (40 mEq Oral Given 12/04/22 1107)    ED Course/ Medical Decision Making/ A&P                             Medical Decision Making Amount and/or Complexity of Data Reviewed Labs: ordered. Radiology: ordered.  Risk Prescription drug management.   This patient presents to the ED for concern of dizziness, this involves an extensive number of treatment options, and is a complaint that carries with it a high risk of complications and morbidity.  The differential diagnosis includes afib/svt, electrolyte abn, infection   Co morbidities that complicate the patient evaluation   htn, arthritis, afib/SVT, hld, gerd, kidney stones, hyperthyroidism, prostate cancer, migraines, and ibs   Additional history obtained:  Additional  history obtained from epic chart review  Lab Tests:  I Ordered, and personally interpreted labs.  The pertinent results include:  covid neg, trop neg times 2, cbc with wbc 3.0 and hgb 12.7 (hgb 13.4 on 5/4), mg nl at 2.1, trop 5, k low at 3.1   Imaging Studies ordered:  I ordered imaging studies including cxr  I independently visualized and interpreted imaging which showed There are no new infiltrates or signs of pulmonary edema.  I agree with the radiologist interpretation   Cardiac Monitoring:  The patient was maintained on a cardiac monitor.  I personally viewed and interpreted the cardiac monitored which showed an underlying rhythm of: sb   Medicines ordered and prescription drug management:  I ordered medication including kdur  for hypokalemia  Reevaluation of the patient after these medicines showed that the patient improved I have reviewed the patients home medicines and have made adjustments as needed   Problem List / ED Course:  Bradycardia:  pt was taken off metoprolol in the hospital due to bradycardia and he was told to resume it at home.  Pt has been in the 40s here, so  I am going to have him cut his dose in half to 12.5 mg BID.   Hypokalemia:  pt was taking kdur 4 times a day.  It was elevated when he went into the hospital, so he was told to stop taking it.  So, he's not taken it since d/c.  I gave him 40 meq in the ED and he is told to start taking 20 meq once a day and get it rechecked by pcp.    Reevaluation:  After the interventions noted above, I reevaluated the patient and found that they have :improved   Social Determinants of Health:  Lives at home   Dispostion:  After consideration of the diagnostic results and the patients  response to treatment, I feel that the patent would benefit from discharge with outpatient f/u.          Final Clinical Impression(s) / ED Diagnoses Final diagnoses:  Bradycardia  Hypokalemia    Rx / DC Orders ED  Discharge Orders          Ordered    metoprolol tartrate (LOPRESSOR) 25 MG tablet  2 times daily        12/04/22 1211    potassium chloride SA (KLOR-CON M) 20 MEQ tablet  Daily        12/04/22 1211              Jacalyn Lefevre, MD 12/04/22 1212

## 2022-12-04 NOTE — Telephone Encounter (Signed)
Patient is calling requesting a callback from the nurse regarding rescheduling the ablation. Please advise.

## 2022-12-04 NOTE — Telephone Encounter (Signed)
Pt called back per message received in [facility].    Pt stated he was seen in the ER on 5/13 morning hours.  He called to reschedule his ablation with Dr. Lewayne Bunting.  His blood pressure has been up and down, ER provider recommended PCP follow up for BP and  hypokalemia.  Pt advised to reach out to PCP per ER provider note, and that I will contact procedure scheduling to re-schedule his SVT Ablation per Pt request.  Pt states he is concerned about his BP, and wants to reschedule. I will forward this message to scheduling.

## 2022-12-04 NOTE — Discharge Instructions (Addendum)
Spinach in particular is an excellent source of both magnesium and potassium. But don't forget fruits (avocado, banana, apple), starchy vegetables (potatoes, yams, carrots), legumes, and meat and fish like chicken, beef, and salmon.  Start taking your Kdur 20 meq once a day.  You will need to see your pcp to get your K rechecked.  Decrease your metoprolol from 25 mg twice a day to 12.5 mg twice a day.  This will help with your low heart rate which could be the cause of your dizziness.

## 2022-12-04 NOTE — ED Triage Notes (Signed)
Patient complains of his blood pressure being elevated and having some chest tightness and a headache

## 2022-12-05 ENCOUNTER — Ambulatory Visit: Payer: Self-pay | Admitting: *Deleted

## 2022-12-05 ENCOUNTER — Encounter: Payer: Self-pay | Admitting: *Deleted

## 2022-12-05 DIAGNOSIS — E663 Overweight: Secondary | ICD-10-CM | POA: Diagnosis not present

## 2022-12-05 DIAGNOSIS — I10 Essential (primary) hypertension: Secondary | ICD-10-CM | POA: Diagnosis not present

## 2022-12-05 DIAGNOSIS — E876 Hypokalemia: Secondary | ICD-10-CM | POA: Diagnosis not present

## 2022-12-05 DIAGNOSIS — I4891 Unspecified atrial fibrillation: Secondary | ICD-10-CM | POA: Diagnosis not present

## 2022-12-05 DIAGNOSIS — Z6829 Body mass index (BMI) 29.0-29.9, adult: Secondary | ICD-10-CM | POA: Diagnosis not present

## 2022-12-05 NOTE — Patient Outreach (Addendum)
Care Coordination   Initial Visit Note   12/05/2022 Name: Steven [name] MRN: [id number] DOB: 1950-10-26  Steven [name] is a 72 y.o. year old male who sees Steven Nevins, MD for primary care. I spoke with  Steven [name] by phone today.  What matters to the patients health and wellness today?  Managing blood pressure and arrhythmias     Goals Addressed             This Visit's Progress    Manage Blood Pressure & Heart Rate/Rhythm       Care Coordination Goals: Patient will take medications as directed and report any negative side effects to provider  Patient will talk with cardiologist about antihypertensives and possibly changing dosage or directions to better manage hypertension Clonidine, losartan, metoprolol, furosemide, hydralazine  Patient will talk with cardiologist about supplements like "Total Beets" that are meant to lower blood pressure Steven [name] to message Steven [name] regarding supplements and medication dose and directions Patient will use a pill box/organizer to help keep up with when to take medications Patient will monitor and record blood pressure and heart rate twice and as needed and will call PCP or cardiologist with any readings outside of recommended range Patient will document any symptoms on blood pressure/heart rate log Patient will keep all recommended follow-up appointments with PCP and cardiologist Patient will keep all medical procedure appointments Waiting for scheduling to call and reschedule cardiac ablation that is scheduled for next week. Would like to schedule in June due to new insurance.  Patient will take blood pressure log to PCP and specialty appointments for review Patient will follow a low sodium/DASH diet  Patient will review handouts on SVT Patient will state understanding of techniques (like valsalva maneuver) to manage SVT Patient will move carefully and change positions slowly to decrease risk of orthostatic hypotension and falls Patient  will state understanding of the role uncontrolled hyperthyroidism can play in hypertension and tachycardia Patient will continue to take methimazole as directed  Patient will keep f/u with endocrinologist in June and will repeat TSH, T3, T4 labs as directed Patient will reach out to RN Care Coordinator [phone] with any care coordination or resource needs          SDOH assessments and interventions completed:  Yes  SDOH Interventions Today    Flowsheet Row Most Recent Value  SDOH Interventions   Transportation Interventions Intervention Not Indicated  Financial Strain Interventions Intervention Not Indicated        Care Coordination Interventions:  Yes, provided  Interventions Today    Flowsheet Row Most Recent Value  Chronic Disease   Chronic disease during today's visit Hypertension (HTN), Atrial Fibrillation (AFib), Other  [SVT, orthostatic hypotension, bradycardia, hyperthyroidism]  General Interventions   General Interventions Discussed/Reviewed General Interventions Discussed, General Interventions Reviewed, Labs, Durable Medical Equipment (DME), Doctor Visits, Communication with  Doctor Visits Discussed/Reviewed Doctor Visits Discussed, Doctor Visits Reviewed, PCP, Specialist  [facility] visit yesterday. Cardiology f/u scheduled.]  Durable Medical Equipment (DME) BP Cuff  PCP/Specialist Visits Compliance with follow-up visit  Communication with PCP/Specialists  [Staff message to Steven [name] (cardio) re: possibly adjusting losartan from morning to night and increasing clonidine .1mg  to TID instead of BID PRN. patient complains of elev BP late evening around 200/100. Feels restless and uneasy and can't sleep.]  Exercise Interventions   Exercise Discussed/Reviewed Physical Activity, Exercise Discussed, Exercise Reviewed  Physical Activity Discussed/Reviewed Physical Activity Discussed, Physical Activity Reviewed  Education Interventions   Education Provided  Provided Education,  Provided Armed forces training and education officer on SVT and management mailed]  Provided Verbal Education On Other, When to see the doctor, Mental Health/Coping with Illness, Nutrition, Labs, Medication, Exercise  [Valsalva maneuver discussed. Prop up when sleeping since BP elev when laying flat. Watch salt intake. Role that BP & HR play to compensate for each other. Effect of uncontrolled hyperthyroidism on BP and HR and importance of endo f/u and med compliance]  Labs Reviewed --  [TSH, T3, T4 levels. Future order for repeat labs in June]  Nutrition Interventions   Nutrition Discussed/Reviewed Nutrition Discussed, Nutrition Reviewed, Fluid intake, Decreasing salt  Pharmacy Interventions   Pharmacy Dicussed/Reviewed Medications and their functions, Pharmacy Topics Reviewed, Pharmacy Topics Discussed, Medication Adherence, Affording Medications  [thoroughly reviewed medications, dosing, and directions. Patient is taking as directed. Collaboration with cardio re: meds. Would clonidine TID or clonidine patch be appropriate?]  Safety Interventions   Safety Discussed/Reviewed Safety Discussed, Fall Risk, Home Safety, Safety Reviewed       Follow up plan: Follow up call scheduled for 12/14/22    Encounter Outcome:  Pt. Visit Completed   Steven [name], BSN, RN-BC RN Care Coordinator [facility]  [facility] Direct Dial: [phone] Main #: [phone]

## 2022-12-07 NOTE — Telephone Encounter (Signed)
Lengthy conversation with pt regarding the cost of his procedure. He stated that his OOP cost is over $4000. I told him when he gets to the hospital he needs to tell them he needs to speak to someone about pt asst.  He agreed to keep the procedure as planned because he wants to feel better.

## 2022-12-07 NOTE — Telephone Encounter (Signed)
Patient is calling to follow up on r/s upcoming ablation.

## 2022-12-07 NOTE — Telephone Encounter (Signed)
Pt called [facility].    Pt stated he wants to reschedule his upcoming ablation with Dr. Ladona Ridgel.  Pt advised Ms. April of scheduling will be reaching out to him to do this.    Pt advised she will contact him in the near future.

## 2022-12-08 ENCOUNTER — Other Ambulatory Visit: Payer: Self-pay

## 2022-12-08 ENCOUNTER — Emergency Department (HOSPITAL_COMMUNITY): Payer: Medicare HMO

## 2022-12-08 ENCOUNTER — Emergency Department (HOSPITAL_COMMUNITY)
Admission: EM | Admit: 2022-12-08 | Discharge: 2022-12-08 | Disposition: A | Discharge status: Home / Self Care | Payer: Medicare HMO | Attending: Emergency Medicine | Admitting: Emergency Medicine

## 2022-12-08 ENCOUNTER — Encounter (HOSPITAL_COMMUNITY): Payer: Self-pay | Admitting: Radiology

## 2022-12-08 ENCOUNTER — Telehealth: Payer: Self-pay | Admitting: Internal Medicine

## 2022-12-08 DIAGNOSIS — Z743 Need for continuous supervision: Secondary | ICD-10-CM | POA: Diagnosis not present

## 2022-12-08 DIAGNOSIS — Z91040 Latex allergy status: Secondary | ICD-10-CM | POA: Diagnosis not present

## 2022-12-08 DIAGNOSIS — I10 Essential (primary) hypertension: Secondary | ICD-10-CM

## 2022-12-08 DIAGNOSIS — Z8546 Personal history of malignant neoplasm of prostate: Secondary | ICD-10-CM | POA: Diagnosis not present

## 2022-12-08 DIAGNOSIS — R739 Hyperglycemia, unspecified: Secondary | ICD-10-CM | POA: Insufficient documentation

## 2022-12-08 DIAGNOSIS — Z79899 Other long term (current) drug therapy: Secondary | ICD-10-CM | POA: Insufficient documentation

## 2022-12-08 DIAGNOSIS — R079 Chest pain, unspecified: Secondary | ICD-10-CM | POA: Diagnosis not present

## 2022-12-08 DIAGNOSIS — R519 Headache, unspecified: Secondary | ICD-10-CM | POA: Diagnosis not present

## 2022-12-08 DIAGNOSIS — Z7901 Long term (current) use of anticoagulants: Secondary | ICD-10-CM | POA: Insufficient documentation

## 2022-12-08 DIAGNOSIS — R1110 Vomiting, unspecified: Secondary | ICD-10-CM | POA: Diagnosis not present

## 2022-12-08 DIAGNOSIS — R531 Weakness: Secondary | ICD-10-CM | POA: Diagnosis not present

## 2022-12-08 LAB — URINALYSIS, ROUTINE W REFLEX MICROSCOPIC
Bilirubin Urine: NEGATIVE
Glucose, UA: NEGATIVE mg/dL
Hgb urine dipstick: NEGATIVE
Ketones, ur: NEGATIVE mg/dL
Leukocytes,Ua: NEGATIVE
Nitrite: NEGATIVE
Protein, ur: NEGATIVE mg/dL
Specific Gravity, Urine: 1.002 — ABNORMAL LOW (ref 1.005–1.030)
pH: 7 (ref 5.0–8.0)

## 2022-12-08 LAB — CBC
HCT: 40.5 % (ref 39.0–52.0)
Hemoglobin: 13.2 g/dL (ref 13.0–17.0)
MCH: 29.4 pg (ref 26.0–34.0)
MCHC: 32.6 g/dL (ref 30.0–36.0)
MCV: 90.2 fL (ref 80.0–100.0)
Platelets: 222 10*3/uL (ref 150–400)
RBC: 4.49 MIL/uL (ref 4.22–5.81)
RDW: 13.1 % (ref 11.5–15.5)
WBC: 3.3 10*3/uL — ABNORMAL LOW (ref 4.0–10.5)
nRBC: 0 % (ref 0.0–0.2)

## 2022-12-08 LAB — TROPONIN I (HIGH SENSITIVITY)
Troponin I (High Sensitivity): 5 ng/L (ref <18)
Troponin I (High Sensitivity): 7 ng/L (ref <18)

## 2022-12-08 LAB — BASIC METABOLIC PANEL
Anion gap: 8 (ref 5–15)
BUN: <5 mg/dL — ABNORMAL LOW (ref 8–23)
CO2: 26 mmol/L (ref 22–32)
Calcium: 9.5 mg/dL (ref 8.9–10.3)
Chloride: 105 mmol/L (ref 98–111)
Creatinine, Ser: 0.83 mg/dL (ref 0.61–1.24)
GFR, Estimated: >60 mL/min (ref >60)
Glucose, Bld: 111 mg/dL — ABNORMAL HIGH (ref 70–99)
Potassium: 3.9 mmol/L (ref 3.5–5.1)
Sodium: 139 mmol/L (ref 135–145)

## 2022-12-08 MED ORDER — CLONIDINE HCL 0.1 MG PO TABS
0.1000 mg | ORAL_TABLET | Freq: Once | ORAL | Status: AC
  Administered 2022-12-08: 0.1 mg via ORAL
  Filled 2022-12-08: qty 1

## 2022-12-08 MED ORDER — MORPHINE SULFATE (PF) 4 MG/ML IV SOLN
2.0000 mg | Freq: Once | INTRAVENOUS | Status: AC
  Administered 2022-12-08: 2 mg via INTRAVENOUS
  Filled 2022-12-08: qty 1

## 2022-12-08 MED ORDER — KETOROLAC TROMETHAMINE 15 MG/ML IJ SOLN
15.0000 mg | Freq: Once | INTRAMUSCULAR | Status: AC
  Administered 2022-12-08: 15 mg via INTRAVENOUS
  Filled 2022-12-08: qty 1

## 2022-12-08 NOTE — ED Triage Notes (Signed)
[place] of chest tightness and a headache. Pt thinks his A fib is acting up and has had problems with his blood pressure recently.

## 2022-12-08 NOTE — ED Provider Notes (Signed)
[place] EMERGENCY DEPARTMENT AT [facility] Provider Note   CSN: [id number] Arrival date & time: 12/08/22  1542     History  Chief Complaint  Patient presents with   Chest Pain    Steven [name] is a 72 y.o. male with past medical history significant for A-fib, hypertension, SVT, hypertensive urgency, malignant neoplasm of prostate who presents with concern for chest tightness, headache, feeling "dizzy headed, woozy" and concern for poorly controlled blood pressure.  Patient reports systolic max of 217, diastolic of 107 as recently as last night.  Patient reports that he has had frequent changes to his blood pressure medication, he is taking his losartan every morning, reports that he took his hydralazine prior to arrival but blood pressure did not improve.  Patient also has clonidine, reports that he did not try to take this medication, felt that he needed to come back to the emergency department for evaluation.  Patient with plan for surgical ablation to help treat his SVT in 4 days.  Patient reports that his cardiologist will not help to manage his blood pressure and they keep telling him that his primary care doctor will manage it.  Patient reports that his primary care doctor is not managing it appropriately.  Patient reports that he does continue to have some chest tightness, he denies shortness of breath, nausea, vomiting.   Chest Pain      Home Medications Prior to Admission medications   Medication Sig Start Date End Date Taking? Authorizing Provider  acetaminophen (TYLENOL) 500 MG tablet Take 500 mg by mouth every 6 (six) hours as needed for moderate pain.    [provider]  alfuzosin (UROXATRAL) 10 MG 24 hr tablet Take 1 tablet (10 mg total) by mouth daily with breakfast. 04/07/22   [name], Mardene Celeste, MD  Cholecalciferol (VITAMIN D3) 125 MCG (5000 UT) CAPS Take 1 capsule by mouth daily.     [provider]  cloNIDine (CATAPRES) 0.1 MG tablet  Take 1 tablet (0.1 mg total) by mouth 2 (two) times daily as needed (SBP>160). 11/27/22   Sherryll Burger, Pratik D, DO  ELIQUIS 5 MG TABS tablet TAKE 1 TABLET(5 MG) BY MOUTH TWICE DAILY Patient taking differently: Take 5 mg by mouth 2 (two) times daily. 10/18/22   Wendall Stade, MD  furosemide (LASIX) 20 MG tablet Take 1 tablet (20 mg total) by mouth daily. Take with or after meal. 11/07/22   Marinus Maw, MD  gabapentin (NEURONTIN) 300 MG capsule Take 300 mg by mouth 3 (three) times daily as needed (nerve pain). 02/24/20   [provider]  Galcanezumab-gnlm (EMGALITY) 120 MG/ML SOAJ Inject 1 Pen into the skin every 30 (thirty) days. 09/25/22   Ocie Doyne, MD  hydrALAZINE (APRESOLINE) 25 MG tablet Take 25 mg by mouth 3 (three) times daily. 10/09/22   [provider]  hydrOXYzine (ATARAX) 10 MG tablet Take 1 tablet (10 mg total) by mouth 3 (three) times daily as needed for anxiety. 11/26/22   Sherryll Burger, Pratik D, DO  losartan (COZAAR) 100 MG tablet Take 100 mg by mouth daily.    [provider]  methimazole (TAPAZOLE) 10 MG tablet Take 1 tablet (10 mg total) by mouth daily with breakfast. 10/16/22   Nida, Denman George, MD  metoprolol tartrate (LOPRESSOR) 25 MG tablet Take 0.5 tablets (12.5 mg total) by mouth 2 (two) times daily. 12/04/22 01/03/23  Jacalyn Lefevre, MD  NURTEC 75 MG TBDP Take 75 mg by mouth daily as  needed (pain). 08/14/22   [provider]  ondansetron (ZOFRAN-ODT) 4 MG disintegrating tablet Take 1 tablet (4 mg total) by mouth every 8 (eight) hours as needed for nausea or vomiting. 08/23/22   Vassie Loll, MD  polyethylene glycol (MIRALAX / GLYCOLAX) 17 g packet Take 17 g by mouth daily. 08/23/22   Vassie Loll, MD  potassium chloride SA (KLOR-CON M) 20 MEQ tablet Take 1 tablet (20 mEq total) by mouth daily. 12/04/22   Jacalyn Lefevre, MD  Probiotic Product (PROBIOTIC-10 PO) Take 1 capsule by mouth daily.    [provider]  traZODone (DESYREL) 50 MG tablet  Take 50-150 mg by mouth at bedtime. 11/14/22   [provider]  Ubrogepant (UBRELVY) 100 MG TABS Take 1 tablet (100 mg total) by mouth as needed (for headaches). May repeat a dose in 2 hours if needed. Max dose 2 pills in 24 hours 09/25/22   Ocie Doyne, MD  vitamin B-12 (CYANOCOBALAMIN) 100 MCG tablet Take 100 mcg by mouth daily.    [provider]      Allergies    Latex and Meclizine    Review of Systems   Review of Systems  Cardiovascular:  Positive for chest pain.  All other systems reviewed and are negative.   Physical Exam Updated Vital Signs BP (!) 177/74   Pulse (!) 57   Temp 98.1 F (36.7 C) (Oral)   Resp 16   Ht 5\' 7"  (1.702 m)   Wt 82.1 kg   SpO2 100%   BMI 28.35 kg/m  Physical Exam Vitals and nursing note reviewed.  Constitutional:      General: He is not in acute distress.    Appearance: Normal appearance.  HENT:     Head: Normocephalic and atraumatic.  Eyes:     General:        Right eye: No discharge.        Left eye: No discharge.  Cardiovascular:     Rate and Rhythm: Normal rate and regular rhythm.     Heart sounds: No murmur heard.    No friction rub. No gallop.  Pulmonary:     Effort: Pulmonary effort is normal.     Breath sounds: Normal breath sounds.  Abdominal:     General: Bowel sounds are normal.     Palpations: Abdomen is soft.  Skin:    General: Skin is warm and dry.     Capillary Refill: Capillary refill takes less than 2 seconds.  Neurological:     Mental Status: He is alert and oriented to person, place, and time.  Psychiatric:        Mood and Affect: Mood normal.        Behavior: Behavior normal.     ED Results / Procedures / Treatments   Labs (all labs ordered are listed, but only abnormal results are displayed) Labs Reviewed  BASIC METABOLIC PANEL - Abnormal; Notable for the following components:      Result Value   Glucose, Bld 111 (*)    BUN <5 (*)    All other components within normal limits  CBC  - Abnormal; Notable for the following components:   WBC 3.3 (*)    All other components within normal limits  URINALYSIS, ROUTINE W REFLEX MICROSCOPIC - Abnormal; Notable for the following components:   Color, Urine STRAW (*)    Specific Gravity, Urine 1.002 (*)    All other components within normal limits  TROPONIN I (HIGH SENSITIVITY)  TROPONIN I (HIGH SENSITIVITY)    EKG EKG Interpretation  Date/Time:  Friday Dec 08 2022 15:51:41 EDT Ventricular Rate:  59 PR Interval:  169 QRS Duration: 89 QT Interval:  417 QTC Calculation: 414 R Axis:   -1 Text Interpretation: Sinus rhythm Borderline T abnormalities, inferior leads improved from prior Confirmed by Vivien Rossetti ([id number]) on 12/08/2022 4:01:33 PM  Radiology CT Head Wo Contrast  Result Date: 12/08/2022 CLINICAL DATA:  Headache, increasing severity. EXAM: CT HEAD WITHOUT CONTRAST TECHNIQUE: Contiguous axial images were obtained from the base of the skull through the vertex without intravenous contrast. RADIATION DOSE REDUCTION: This exam was performed according to the departmental dose-optimization program which includes automated exposure control, adjustment of the mA and/or kV according to patient size and/or use of iterative reconstruction technique. COMPARISON:  CT head dated February 17, 2021 FINDINGS: Brain: No evidence of acute infarction, hemorrhage, hydrocephalus, extra-axial collection or mass lesion/mass effect. Vascular: No hyperdense vessel or unexpected calcification. Skull: Normal. Negative for fracture or focal lesion. Sinuses/Orbits: No acute finding. Other: None. IMPRESSION: No acute intracranial pathology. Electronically Signed   By: Larose Hires D.O.   On: 12/08/2022 16:40   DG Chest Port 1 View  Result Date: 12/08/2022 CLINICAL DATA:  Chest pain EXAM: PORTABLE CHEST 1 VIEW COMPARISON:  CXR 12/04/22 FINDINGS: No pleural effusion. No pneumothorax. No new focal airspace opacity. Unchanged appearance of the left lung base.  Unchanged cardiac and mediastinal contours. No radiographically apparent displaced rib fractures. Visualized upper abdomen is unremarkable. IMPRESSION: No new focal airspace opacity. Electronically Signed   By: Lorenza Cambridge M.D.   On: 12/08/2022 16:17    Procedures Procedures    Medications Ordered in ED Medications  ketorolac (TORADOL) 15 MG/ML injection 15 mg (15 mg Intravenous Given 12/08/22 1651)  cloNIDine (CATAPRES) tablet 0.1 mg (0.1 mg Oral Given 12/08/22 1651)  morphine (PF) 4 MG/ML injection 2 mg (2 mg Intravenous Given 12/08/22 1652)    ED Course/ Medical Decision Making/ A&P                             Medical Decision Making Amount and/or Complexity of Data Reviewed Labs: ordered. Radiology: ordered.  Risk Prescription drug management.   Medical Decision Making:   JIBRIL ERHARDT is a 72 y.o. male who presented to the ED today with Hypertension, dizziness, chest tightness, wooziness detailed above.    External chart has been reviewed including reviewed recent lab work, imaging from hospital admission, cardiology appointment, reviewed his upcoming surgical ablation. Patient's presentation is complicated by their history of A-fib, SVT, hypertension, hyperlipidemia, prostate cancer.  Patient placed on continuous vitals and telemetry monitoring while in ED which was reviewed periodically.  Complete initial physical exam performed, notably the patient  was mildly hypertensive, blood pressure 166/82 on arrival, he is afebrile with normal respiratory rate, rhythm, normal pulse rate, rhythm.  This is.    Reviewed and confirmed nursing documentation for past medical history, family history, social history.    Initial Assessment:   With the patient's presentation of elevated blood pressure readings, most likely diagnosis is hypertensive urgency. Other diagnoses associated with hypertensive emergency were considered including (but not limited to) intracranial hemorrhage, acute renal  artery stenosis, acute kidney injury, myocardial stress, ophthalmologic emergencies. These are considered less likely due to history of present illness and physical exam findings.   This is most consistent with an acute life/limb threatening illness complicated by underlying chronic conditions.  Will evaluate for hypertensive emergency as below. Initial Plan:  Assess for endorgan damage, hypertensive urgency in the context of some generalized dizziness, wooziness, chest tightness with poorly controlled blood pressure Screening labs including CBC and Metabolic panel to evaluate for infectious or metabolic etiology of disease.  Urinalysis with reflex culture ordered to evaluate for UTI or relevant urologic/nephrologic pathology.  CXR to evaluate for structural/infectious intrathoracic pathology.  Given headache, eval for ICH with CTH EKG to evaluate for cardiac pathology. Objective evaluation as below reviewed. Considered further administration of antihypertensives in ED, per consensus guidelines for [facility] of emergency physicians, acute treatment of hypertensive urgency alone in the emergency department is not recommended.  If patient has evidence of hypertensive emergency on objective laboratory evaluation, will reevaluate.  Will monitor blood pressure while patient awaiting above laboratory studies.  Initial Study Results:   Laboratory  All laboratory results reviewed without evidence of clinically relevant pathology.   Exceptions include: Mild hyperglycemia, glucose 111  EKG EKG was reviewed independently. Rate, rhythm, axis, intervals all examined and without medically relevant abnormality. ST segments without concerns for elevations.  Borderline T wave abnormalities noted, overall improved from prior EKG.  Radiology:  All images reviewed independently. Agree with radiology report at this time.   CT Head Wo Contrast  Result Date: 12/08/2022 CLINICAL DATA:  Headache, increasing  severity. EXAM: CT HEAD WITHOUT CONTRAST TECHNIQUE: Contiguous axial images were obtained from the base of the skull through the vertex without intravenous contrast. RADIATION DOSE REDUCTION: This exam was performed according to the departmental dose-optimization program which includes automated exposure control, adjustment of the mA and/or kV according to patient size and/or use of iterative reconstruction technique. COMPARISON:  CT head dated February 17, 2021 FINDINGS: Brain: No evidence of acute infarction, hemorrhage, hydrocephalus, extra-axial collection or mass lesion/mass effect. Vascular: No hyperdense vessel or unexpected calcification. Skull: Normal. Negative for fracture or focal lesion. Sinuses/Orbits: No acute finding. Other: None. IMPRESSION: No acute intracranial pathology. Electronically Signed   By: Larose Hires D.O.   On: 12/08/2022 16:40   DG Chest Port 1 View  Result Date: 12/08/2022 CLINICAL DATA:  Chest pain EXAM: PORTABLE CHEST 1 VIEW COMPARISON:  CXR 12/04/22 FINDINGS: No pleural effusion. No pneumothorax. No new focal airspace opacity. Unchanged appearance of the left lung base. Unchanged cardiac and mediastinal contours. No radiographically apparent displaced rib fractures. Visualized upper abdomen is unremarkable. IMPRESSION: No new focal airspace opacity. Electronically Signed   By: Lorenza Cambridge M.D.   On: 12/08/2022 16:17   DG Chest Port 1 View  Result Date: 12/04/2022 CLINICAL DATA:  Chest pain EXAM: PORTABLE CHEST 1 VIEW COMPARISON:  Previous studies including the examination of 09/30/2022 FINDINGS: Transverse diameter of heart is slightly increased. There are no signs of pulmonary edema or focal pulmonary consolidation. Blunting of left lateral CP angle has not changed suggesting possible pleural thickening. There is no pneumothorax. IMPRESSION: There are no new infiltrates or signs of pulmonary edema. Electronically Signed   By: Ernie Avena M.D.   On: 12/04/2022 09:07     Final Assessment and Plan:   Patient does have poor control overall of his blood pressure, he has not been significantly elevated in the emergency department with most readings around 1 40-1 50 systolic, diastolic in the 70-80 range.  At time of discharge blood pressure 177/74.  Discussed with the patient that although I do think that he needs tighter control of his blood pressure at this time there is no evidence  of any emergent finding, hypertensive urgency, emergency.  I encouraged him to follow-up closely with his cardiologist, he is scheduled for an ablation in several days.  While he is hospitalized for the ablation discussed that they can work on tighter control of his blood pressure, but no acute intervention needed at this time.   Clinical Impression:  1. Poorly-controlled hypertension      Discharge   Final Clinical Impression(s) / ED Diagnoses Final diagnoses:  Poorly-controlled hypertension    Rx / DC Orders ED Discharge Orders     None         [place] 12/08/22 2245    Mardene Sayer, MD 12/09/22 1016

## 2022-12-08 NOTE — Telephone Encounter (Signed)
Call transferred into triage. The pt was 3-4 blocks from home driving and he got really sick - nausea, pounding headache, chest pressure.  He is concerned about his blood pressure which he said goes up higher at night.  Last night's reading was 217/100.  He confirms he is taking all of his prescribed medications.    He is worried if he will be able to have the SVT ablation on Tuesday.    Adv to continue through the ER process and we will pass the information on to Dr. Ladona Ridgel.

## 2022-12-08 NOTE — Telephone Encounter (Signed)
Pt c/o BP issue: STAT if pt c/o blurred vision, one-sided weakness or slurred speech  1. What are your last 5 BP readings? Does not know right now, 162/82  2. Are you having any other symptoms (ex. Dizziness, headache, blurred vision, passed out)? Dizziness, headache, and blurred vision, nauseas, anxiety  3. What is your BP issue? Patient states he does not know how he will have his ablation done because his BP has been high. He says last night his BP went up to 217/100's. He says 2 hours ago he felt sick. He says he was driving had to pull over for EMS to pick him up. He says he is currently in the ED.

## 2022-12-08 NOTE — ED Notes (Signed)
Pt given graham crackers and ginger ale ?

## 2022-12-08 NOTE — Discharge Instructions (Addendum)
Please follow-up with your cardiologist, primary care doctor to discuss tighter control of your blood pressure medication.

## 2022-12-08 NOTE — ED Notes (Signed)
Patient transported to CT 

## 2022-12-10 NOTE — Telephone Encounter (Signed)
Ok to have ablation as scheduled. During the ablation the meds we use will actually lower the bp. GT

## 2022-12-11 ENCOUNTER — Telehealth: Payer: Self-pay | Admitting: *Deleted

## 2022-12-11 ENCOUNTER — Encounter: Payer: Self-pay | Admitting: *Deleted

## 2022-12-11 NOTE — Telephone Encounter (Signed)
Pt called and states he is still having sharp pain on his left side when he has a bowel movement. Sometimes he feels like he is going to pass out before and after a bowel movement. He states he never heard anything about the referral. Please advise.

## 2022-12-11 NOTE — Pre-Procedure Instructions (Signed)
Instructed patient on the following items: Arrival time 0730 Nothing to eat or drink after midnight No meds AM of procedure Responsible person to drive you home and stay with you for 24 hrs  Have you missed any doses of anti-coagulant Eliquis- takes twice a day, hasn't missed any doses.  Last dose of Metoprolol 5/19.

## 2022-12-11 NOTE — Telephone Encounter (Signed)
Pt called back to touch base, per message received from Dr. Ladona Ridgel and Ms. Restaurant manager[employer].   Pt stated he recently had to go to the ER.  Pt has SVT Ablation scheduled for tomorrow.  Pt advised per Dr. Ladona Ridgel, that the Ablation will help him, and reduce symptoms.  They will also be able to monitor HR and BP.  Long conversation with Pt, but sounds like he will be coming in at 730 AM tomorrow morning for procedure.  Pt stated he was going to reach out to his GI doctor today.    No follow up required at this time.

## 2022-12-12 ENCOUNTER — Encounter (HOSPITAL_COMMUNITY)
Admission: RE | Disposition: A | Discharge status: Home / Self Care | Payer: Self-pay | Source: Ambulatory Visit | Attending: Internal Medicine

## 2022-12-12 ENCOUNTER — Ambulatory Visit (HOSPITAL_COMMUNITY)
Admission: RE | Admit: 2022-12-12 | Discharge: 2022-12-12 | Disposition: A | Discharge status: Home / Self Care | Payer: Medicare HMO | Source: Ambulatory Visit | Attending: Internal Medicine | Admitting: Internal Medicine

## 2022-12-12 DIAGNOSIS — Z7901 Long term (current) use of anticoagulants: Secondary | ICD-10-CM | POA: Insufficient documentation

## 2022-12-12 DIAGNOSIS — I471 Supraventricular tachycardia, unspecified: Secondary | ICD-10-CM | POA: Diagnosis not present

## 2022-12-12 DIAGNOSIS — Z87891 Personal history of nicotine dependence: Secondary | ICD-10-CM | POA: Diagnosis not present

## 2022-12-12 DIAGNOSIS — I4719 Other supraventricular tachycardia: Secondary | ICD-10-CM | POA: Diagnosis not present

## 2022-12-12 DIAGNOSIS — I480 Paroxysmal atrial fibrillation: Secondary | ICD-10-CM | POA: Diagnosis not present

## 2022-12-12 DIAGNOSIS — I10 Essential (primary) hypertension: Secondary | ICD-10-CM | POA: Diagnosis not present

## 2022-12-12 DIAGNOSIS — Z8249 Family history of ischemic heart disease and other diseases of the circulatory system: Secondary | ICD-10-CM | POA: Diagnosis not present

## 2022-12-12 HISTORY — PX: SVT ABLATION: EP1225

## 2022-12-12 SURGERY — SVT ABLATION

## 2022-12-12 MED ORDER — ACETAMINOPHEN 325 MG PO TABS
650.0000 mg | ORAL_TABLET | ORAL | Status: DC | PRN
  Administered 2022-12-12: 650 mg via ORAL
  Filled 2022-12-12: qty 2

## 2022-12-12 MED ORDER — FENTANYL CITRATE (PF) 100 MCG/2ML IJ SOLN
INTRAMUSCULAR | Status: AC
  Filled 2022-12-12: qty 2

## 2022-12-12 MED ORDER — SODIUM CHLORIDE 0.9 % IV SOLN: 250.0000 mL | INTRAVENOUS | Status: DC | PRN

## 2022-12-12 MED ORDER — BUPIVACAINE HCL (PF) 0.25 % IJ SOLN
INTRAMUSCULAR | Status: DC | PRN
  Administered 2022-12-12: 55 mL

## 2022-12-12 MED ORDER — FENTANYL CITRATE (PF) 100 MCG/2ML IJ SOLN
INTRAMUSCULAR | Status: DC | PRN
  Administered 2022-12-12: 12.5 ug via INTRAVENOUS
  Administered 2022-12-12: 25 ug via INTRAVENOUS
  Administered 2022-12-12 (×5): 12.5 ug via INTRAVENOUS

## 2022-12-12 MED ORDER — BUPIVACAINE HCL (PF) 0.25 % IJ SOLN
INTRAMUSCULAR | Status: AC
  Filled 2022-12-12: qty 30

## 2022-12-12 MED ORDER — ISOPROTERENOL HCL 0.2 MG/ML IJ SOLN
INTRAMUSCULAR | Status: AC
  Filled 2022-12-12: qty 5

## 2022-12-12 MED ORDER — SODIUM CHLORIDE 0.9% FLUSH: 3.0000 mL | INTRAVENOUS | Status: DC | PRN

## 2022-12-12 MED ORDER — SODIUM CHLORIDE 0.9 % IV SOLN: INTRAVENOUS | Status: DC

## 2022-12-12 MED ORDER — MIDAZOLAM HCL 5 MG/5ML IJ SOLN
INTRAMUSCULAR | Status: AC
  Filled 2022-12-12: qty 5

## 2022-12-12 MED ORDER — METOPROLOL TARTRATE 12.5 MG HALF TABLET
12.5000 mg | ORAL_TABLET | Freq: Once | ORAL | Status: AC
  Administered 2022-12-12: 12.5 mg via ORAL
  Filled 2022-12-12 (×2): qty 1

## 2022-12-12 MED ORDER — HEPARIN (PORCINE) IN NACL 1000-0.9 UT/500ML-% IV SOLN
INTRAVENOUS | Status: DC | PRN
  Administered 2022-12-12: 500 mL

## 2022-12-12 MED ORDER — MIDAZOLAM HCL 5 MG/5ML IJ SOLN
INTRAMUSCULAR | Status: DC | PRN
  Administered 2022-12-12 (×5): 1 mg via INTRAVENOUS
  Administered 2022-12-12: 2 mg via INTRAVENOUS
  Administered 2022-12-12: 1 mg via INTRAVENOUS

## 2022-12-12 MED ORDER — SODIUM CHLORIDE 0.9% FLUSH: 3.0000 mL | Freq: Two times a day (BID) | INTRAVENOUS | Status: DC

## 2022-12-12 MED ORDER — ONDANSETRON HCL 4 MG/2ML IJ SOLN: 4.0000 mg | Freq: Four times a day (QID) | INTRAMUSCULAR | Status: DC | PRN

## 2022-12-12 MED ORDER — FENTANYL CITRATE (PF) 100 MCG/2ML IJ SOLN: 25.0000 ug | INTRAMUSCULAR | Status: DC | PRN

## 2022-12-12 SURGICAL SUPPLY — 11 items
CATH EZ STEER NAV 4MM D-F CUR (ABLATOR) IMPLANT
CATH JOSEPH QUAD ALLRED 6F REP (CATHETERS) IMPLANT
CATH JOSEPHSON QUAD-ALLRED 6FR (CATHETERS) IMPLANT
CATH WEBSTER BI DIR CS D-F CRV (CATHETERS) IMPLANT
PACK EP LATEX FREE (CUSTOM PROCEDURE TRAY) ×1
PACK EP LF (CUSTOM PROCEDURE TRAY) ×1 IMPLANT
PAD DEFIB RADIO PHYSIO CONN (PAD) ×1 IMPLANT
PATCH CARTO3 (PAD) IMPLANT
SHEATH PINNACLE 6F 10CM (SHEATH) IMPLANT
SHEATH PINNACLE 7F 10CM (SHEATH) IMPLANT
SHEATH PINNACLE 8F 10CM (SHEATH) IMPLANT

## 2022-12-12 NOTE — Telephone Encounter (Signed)
I have sent referral to [facility] x 3 and I called yesterday to check on it. Was told that referral coordinators were behind on referrals and told me to call back tomorrow to check on referral.

## 2022-12-12 NOTE — Telephone Encounter (Signed)
Noted  

## 2022-12-12 NOTE — Progress Notes (Signed)
14:30 Patient c/o feeling HR racing. HR elevated to 133 bmp for about 5 minutes. No shortness of breath, chest pain noted. There was no activity noted prior to elevated HR. Hassell Done, PA called. Orders received to give 12.5 mg Lopressor.

## 2022-12-12 NOTE — Discharge Instructions (Signed)
Cardiac Ablation, Care After  This sheet gives you information about how to care for yourself after your procedure. Your health care provider may also give you more specific instructions. If you have problems or questions, contact your health care provider. What can I expect after the procedure? After the procedure, it is common to have: Bruising around your puncture site. Tenderness around your puncture site. Skipped heartbeats. If you had an atrial fibrillation ablation, you may have atrial fibrillation during the first several months after your procedure.  Tiredness (fatigue).  Follow these instructions at home: Puncture site care  Follow instructions from your health care provider about how to take care of your puncture site. Make sure you: If present, leave stitches (sutures), skin glue, or adhesive strips in place. These skin closures may need to stay in place for up to 2 weeks. If adhesive strip edges start to loosen and curl up, you may trim the loose edges. Do not remove adhesive strips completely unless your health care provider tells you to do that. If a large square bandage is present, this may be removed 24 hours after surgery.  Check your puncture site every day for signs of infection. Check for: Redness, swelling, or pain. Fluid or blood. If your puncture site starts to bleed, lie down on your back, apply firm pressure to the area, and contact your health care provider. Warmth. Pus or a bad smell. A pea or small marble sized lump at the site is normal and can take up to three months to resolve.  Driving Do not drive for at least 4 days after your procedure or however long your health care provider recommends. (Do not resume driving if you have previously been instructed not to drive for other health reasons.) Do not drive or use heavy machinery while taking prescription pain medicine. Activity Avoid activities that take a lot of effort for at least 7 days after your  procedure. Do not lift anything that is heavier than 5 lb (4.5 kg) for one week.  No sexual activity for 1 week.  Return to your normal activities as told by your health care provider. Ask your health care provider what activities are safe for you. General instructions Take over-the-counter and prescription medicines only as told by your health care provider. Do not use any products that contain nicotine or tobacco, such as cigarettes and e-cigarettes. If you need help quitting, ask your health care provider. You may shower after 24 hours, but Do not take baths, swim, or use a hot tub for 1 week.  Do not drink alcohol for 24 hours after your procedure. Keep all follow-up visits as told by your health care provider. This is important. Contact a health care provider if: You have redness, mild swelling, or pain around your puncture site. You have fluid or blood coming from your puncture site that stops after applying firm pressure to the area. Your puncture site feels warm to the touch. You have pus or a bad smell coming from your puncture site. You have a fever. You have chest pain or discomfort that spreads to your neck, jaw, or arm. You have chest pain that is worse with lying on your back or taking a deep breath. You are sweating a lot. You feel nauseous. You have a fast or irregular heartbeat. You have shortness of breath. You are dizzy or light-headed and feel the need to lie down. You have pain or numbness in the arm or leg closest to your puncture   site. Get help right away if: Your puncture site suddenly swells. Your puncture site is bleeding and the bleeding does not stop after applying firm pressure to the area. These symptoms may represent a serious problem that is an emergency. Do not wait to see if the symptoms will go away. Get medical help right away. Call your local emergency services (911 in the [place] Do not drive yourself to the hospital. Summary After the procedure, it  is normal to have bruising and tenderness at the puncture site in your groin, neck, or forearm. Check your puncture site every day for signs of infection. Get help right away if your puncture site is bleeding and the bleeding does not stop after applying firm pressure to the area. This is a medical emergency. This information is not intended to replace advice given to you by your health care provider. Make sure you discuss any questions you have with your health care provider.       Femoral Site Care This sheet gives you information about how to care for yourself after your procedure. Your health care provider may also give you more specific instructions. If you have problems or questions, contact your health care provider. What can I expect after the procedure?  After the procedure, it is common to have: Bruising that usually fades within 1-2 weeks. Tenderness at the site. Follow these instructions at home: Wound care Follow instructions from your health care provider about how to take care of your insertion site. Make sure you: Wash your hands with soap and water before you change your bandage (dressing). If soap and water are not available, use hand sanitizer. Remove your dressing as told by your health care provider. In 24 hours Do not take baths, swim, or use a hot tub until your health care provider approves. You may shower 24-48 hours after the procedure or as told by your health care provider. Gently wash the site with plain soap and water. Pat the area dry with a clean towel. Do not rub the site. This may cause bleeding. Do not apply powder or lotion to the site. Keep the site clean and dry. Check your femoral site every day for signs of infection. Check for: Redness, swelling, or pain. Fluid or blood. Warmth. Pus or a bad smell. Activity For the first 2-3 days after your procedure, or as long as directed: Avoid climbing stairs as much as possible. Do not squat. Do not  lift anything that is heavier than 10 lb (4.5 kg), or the limit that you are told, until your health care provider says that it is safe. For 5 days Rest as directed. Avoid sitting for a long time without moving. Get up to take short walks every 1-2 hours. Do not drive for 24 hours if you were given a medicine to help you relax (sedative). General instructions Take over-the-counter and prescription medicines only as told by your health care provider. Keep all follow-up visits as told by your health care provider. This is important. Contact a health care provider if you have: A fever or chills. You have redness, swelling, or pain around your insertion site. Get help right away if: The catheter insertion area swells very fast. You pass out. You suddenly start to sweat or your skin gets clammy. The catheter insertion area is bleeding, and the bleeding does not stop when you hold steady pressure on the area. The area near or just beyond the catheter insertion site becomes pale, cool, tingly, or numb.   These symptoms may represent a serious problem that is an emergency. Do not wait to see if the symptoms will go away. Get medical help right away. Call your local emergency services (911 in the [place] Do not drive yourself to the hospital. Summary After the procedure, it is common to have bruising that usually fades within 1-2 weeks. Check your femoral site every day for signs of infection. Do not lift anything that is heavier than 10 lb (4.5 kg), or the limit that you are told, until your health care provider says that it is safe. This information is not intended to replace advice given to you by your health care provider. Make sure you discuss any questions you have with your health care provider. Document Revised: 07/23/2017 Document Reviewed: 07/23/2017 Elsevier Patient Education  2020 Elsevier Inc. 

## 2022-12-12 NOTE — H&P (Signed)
HPI Steven [name] is referred for evaluation of SVT. He is a pleasant 72 yo man with HTN, and some degree of orthostasis. He has had multiple ED visits for SVT and has worn a cardiac monitor which demonstrated SVT at rates of up to 218/min. He denies chest pain. He has not had syncope. When he goes into SVT, then feels sob. He also has a h/o PAF but it is unclear if he has SVT induced by atrial fib. He has been on a beta blocker but his HR is on the low side and his bp has been a little high.      Allergies  Allergen Reactions   Latex Rash      Possible reaction to latex gloves per patient   Meclizine        Other reaction(s): Abdominal Pain, Other              Current Outpatient Medications  Medication Sig Dispense Refill   acetaminophen (TYLENOL) 500 MG tablet Take 500 mg by mouth every 6 (six) hours as needed for moderate pain.       alfuzosin (UROXATRAL) 10 MG 24 hr tablet Take 1 tablet (10 mg total) by mouth daily with breakfast. 90 tablet 3   Cholecalciferol (VITAMIN D3) 125 MCG (5000 UT) CAPS Take 1 capsule by mouth daily.        DULoxetine (CYMBALTA) 30 MG capsule Take 30 mg by mouth daily.       ELIQUIS 5 MG TABS tablet TAKE 1 TABLET(5 MG) BY MOUTH TWICE DAILY 60 tablet 5   gabapentin (NEURONTIN) 300 MG capsule Take 300 mg by mouth 3 (three) times daily as needed.       Galcanezumab-gnlm (EMGALITY) 120 MG/ML SOAJ Inject 1 Pen into the skin every 30 (thirty) days. 1.12 mL 7   hydrochlorothiazide (HYDRODIURIL) 25 MG tablet Take 25 mg by mouth daily.       losartan (COZAAR) 100 MG tablet Take 100 mg by mouth daily.       methimazole (TAPAZOLE) 10 MG tablet Take 1 tablet (10 mg total) by mouth daily with breakfast. 30 tablet 2   metoprolol tartrate (LOPRESSOR) 50 MG tablet Take 1 tablet (50 mg total) by mouth 2 (two) times daily. 180 tablet 3   NEXLETOL 180 MG TABS Take 180 mg by mouth daily. Pt taking 2-3 times a week       ondansetron (ZOFRAN-ODT) 4 MG disintegrating  tablet Take 1 tablet (4 mg total) by mouth every 8 (eight) hours as needed for nausea or vomiting. 20 tablet 1   pantoprazole (PROTONIX) 40 MG tablet Take 1 tablet (40 mg total) by mouth daily. 30 tablet 1   polyethylene glycol (MIRALAX / GLYCOLAX) 17 g packet Take 17 g by mouth daily. 30 each 2   potassium chloride SA (KLOR-CON M) 20 MEQ tablet Take 1 tablet (20 mEq total) by mouth 2 (two) times daily. (Patient taking differently: Take 80 mEq by mouth daily.)       Probiotic Product (PROBIOTIC-10 PO) Take 1 capsule by mouth daily.       Ubrogepant (UBRELVY) 100 MG TABS Take 1 tablet (100 mg total) by mouth as needed (for headaches). May repeat a dose in 2 hours if needed. Max dose 2 pills in 24 hours 16 tablet 6   vitamin B-12 (CYANOCOBALAMIN) 100 MCG tablet Take 100 mcg by mouth daily.        No current facility-administered  medications for this visit.            Past Medical History:  Diagnosis Date   Anxiety     Arthritis     Atrial fibrillation     Atrial flutter     BPH (benign prostatic hyperplasia)     Chest pain     Dizziness     Dyspnea      laying down occ   Fracture 08/17/2015    MULTIPLE RIB FRACTURES     FROM FALL    GERD (gastroesophageal reflux disease)     Hemorrhoids     History of kidney stones      noted on CT scan   History of radiation therapy 08/22/11-10/13/11    prostate   Hyperlipemia     Hypertension     Hyperthyroidism     IBS (irritable bowel syndrome)     Insomnia     Light headedness     Migraine     Numbness and tingling in left arm     Numbness and tingling of both legs     Open fracture of left elbow 08/18/2015   Prostate cancer      prostate s/p radiation Mar 2013   Rib fractures 08/17/2015   Tinnitus        ROS:    All systems reviewed and negative except as noted in the HPI.          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 Normal appearing colon   COLONOSCOPY WITH PROPOFOL N/A 10/23/2019    Procedure: COLONOSCOPY WITH PROPOFOL;  Surgeon: Corbin Ade, MD; external and grade 2 internal hemorrhoids, abnormal rectal blood vessels consistent with radiation proctitis s/p APC therapy, otherwise normal exam.   EVALUATION UNDER ANESTHESIA WITH ANAL FISTULECTOMY N/A 01/14/2020    Procedure: ANORECTAL EXAM UNDER ANESTHESIA;  Surgeon: Andria Meuse, MD;  Location: [facility];  Service: General;  Laterality: N/A;   HOT HEMOSTASIS   10/23/2019    Procedure: HOT HEMOSTASIS (ARGON PLASMA COAGULATION/BICAP);  Surgeon: Corbin Ade, MD;  Location: [facility];  Service: Endoscopy;;  apc rectal proctitis      KIDNEY SURGERY   1982    kidney tube collapse repair   RADIOACTIVE SEED IMPLANT        Prostate             Family History  Problem Relation Age of Onset   Aneurysm Mother          aortic   Hypertension Mother     Headache Mother     Prostate cancer Father     Hypertension Father     Colon cancer Neg Hx     Colon polyps Neg Hx          Social History         Socioeconomic History   Marital status: Single      Spouse name: Not on file   Number of children: 2   Years of education: Not on file   Highest education level: 12th grade  Occupational History   Occupation: Custodian       Employer: [employer]  Tobacco Use   Smoking status: Former      Packs/day: 1.00      Years: 20.00      Additional pack years: 0.00      Total pack years: 20.00      Types: Cigarettes      Quit date: 2020      Years since quitting: 4.2   Smokeless tobacco: Never   Tobacco comments:      Quit smoking x 2 years    07/2015   SOMETIIMES i USE VAPOR   Vaping Use   Vaping Use: Never  used  Substance and Sexual Activity   Alcohol use: Never   Drug use: Never   Sexual activity: Not Currently  Other Topics Concern   Not on file  Social History Narrative    Lives with friend    Divorced    No caffeine    Social Determinants of Health        Financial Resource Strain: Low Risk  (11/03/2020)    Overall Financial Resource Strain (CARDIA)     Difficulty of Paying Living Expenses: Not hard at all  Food Insecurity: No Food Insecurity (11/03/2020)    Hunger Vital Sign     Worried About Running Out of Food in the Last Year: Never true     Ran Out of Food in the Last Year: Never true  Transportation Needs: No Transportation Needs (11/03/2020)    PRAPARE - Therapist, art (Medical): No     Lack of Transportation (Non-Medical): No  Physical Activity: Inactive (11/03/2020)    Exercise Vital Sign     Days of Exercise per Week: 0 days     Minutes of Exercise per Session: 0 min  Stress: No Stress Concern Present (11/03/2020)    [employer] of Occupational Health - Occupational Stress Questionnaire     Feeling of Stress : Not at all  Social Connections: Socially Isolated (11/03/2020)    Social Connection and Isolation Panel [NHANES]     Frequency of Communication with Friends and Family: Twice a week     Frequency of Social Gatherings with Friends and Family: Once a week     Attends Religious Services: Never     Database administrator or Organizations: No     Attends Banker Meetings: Never     Marital Status: Divorced  Catering manager Violence: Not At Risk (11/03/2020)    Humiliation, Afraid, Rape, and Kick questionnaire     Fear of Current or Ex-Partner: No     Emotionally Abused: No     Physically Abused: No     Sexually Abused: No        BP 122/70   Pulse (!) 57   Ht 5\' 7"  (1.702 m)   Wt 183 lb (83 kg)   SpO2 97%   BMI 28.66 kg/m    Physical Exam:   Well appearing NAD HEENT: Unremarkable Neck:  No JVD, no  thyromegally Lymphatics:  No adenopathy Back:  No CVA tenderness Lungs:  Clear HEART:  Regular rate rhythm, no murmurs, no rubs, no clicks Abd:  soft, positive bowel sounds, no organomegally, no rebound, no guarding Ext:  2 plus pulses, no edema, no cyanosis, no clubbing Skin:  No rashes no nodules Neuro:  CN II through XII intact, motor grossly intact   EKG - nsr   Assess/Plan: SVT - I have discussed the treatment options with the patient and have offered him  SVT ablation and he will call us if he wishes to proceed. PAF - I suspect that this is related to his SVT. We might consider stopping this if he chooses to have the ablation and does not have more atrial fib. HTN - his bp is labile, especially at night. He had breakthrough elevation at times. I would recommend reducing the toprol and switching to coreg if he chooses.Sharlot Gowda Albertha Beattie,MD

## 2022-12-13 ENCOUNTER — Other Ambulatory Visit: Payer: Self-pay | Admitting: *Deleted

## 2022-12-13 ENCOUNTER — Encounter (HOSPITAL_COMMUNITY): Payer: Self-pay | Admitting: Internal Medicine

## 2022-12-13 ENCOUNTER — Telehealth: Payer: Self-pay | Admitting: Internal Medicine

## 2022-12-13 DIAGNOSIS — R1032 Left lower quadrant pain: Secondary | ICD-10-CM

## 2022-12-13 DIAGNOSIS — K581 Irritable bowel syndrome with constipation: Secondary | ICD-10-CM

## 2022-12-13 DIAGNOSIS — R110 Nausea: Secondary | ICD-10-CM

## 2022-12-13 NOTE — Telephone Encounter (Signed)
Patient called stating he had an ablation done yesterday.  He wants to know he he puts another bandage on when he takes the when he has on now off.  Please advise.

## 2022-12-13 NOTE — Telephone Encounter (Signed)
Referral faxed to [facility] 

## 2022-12-13 NOTE — Telephone Encounter (Signed)
Pt was informed that [facility] denied the referral because they don't do second opinions. I faxed referral to [facility] and pt says that is too far away. He said he has seen [facility] before. He asked if there is a GI specialist in [place]. Please advise. Thank you

## 2022-12-13 NOTE — Telephone Encounter (Signed)
Spoke with pt and informed him that [facility] referral was denied because providers don't deal with 2nd opinions.  He would like to be referral to [facility]. Advised pt would contact him when find out when he can be seen.Marland Kitchen

## 2022-12-13 NOTE — Telephone Encounter (Signed)
Spoke with patient to review post procedure groin site care instructions. Provided education on post procedure site care and bandage removal. Patient verbalized understanding and had no questions.

## 2022-12-14 ENCOUNTER — Encounter: Payer: Self-pay | Admitting: *Deleted

## 2022-12-14 ENCOUNTER — Ambulatory Visit: Payer: Self-pay | Admitting: *Deleted

## 2022-12-14 NOTE — Patient Outreach (Signed)
Care Coordination   Follow Up Visit Note   12/14/2022 Name: Steven [name] MRN: [id number] DOB: [birth year]  Steven [name] is a 72 y.o. year old male who sees Elfredia Nevins, MD for primary care. I spoke with  [name] by phone today.  What matters to the patients health and wellness today?  Managing blood pressure    Goals Addressed             This Visit's Progress    Manage Blood Pressure & Heart Rate/Rhythm   On track    Care Coordination Goals: Patient will take medications as directed and report any negative side effects to provider  Patient will talk with PCP about antihypertensives and possibly changing dosage or directions to better manage hypertension Clonidine, losartan, metoprolol, furosemide, hydralazine  Per Dr Eden Emms, patient can increase clonidine .1mg  to 3 times a day Patient will use a pill box/organizer to help keep up with when to take medications Patient will monitor and record blood pressure and heart rate twice and as needed and will call PCP with any readings outside of recommended range Patient will document any symptoms on blood pressure/heart rate log Patient will keep all recommended follow-up appointments with PCP and cardiologist Patient will keep all medical procedure appointments Waiting for scheduling to call and reschedule cardiac ablation that is scheduled for next week. Would like to schedule in June due to new insurance.  Patient will take blood pressure log to PCP and specialty appointments for review Patient will follow a low sodium/DASH diet  Patient will state understanding of techniques (like valsalva maneuver) to manage SVT Patient will move carefully and change positions slowly to decrease risk of orthostatic hypotension and falls Patient will state understanding of the role uncontrolled hyperthyroidism can play in hypertension and tachycardia Patient will continue to take methimazole as directed  Patient will keep f/u with  endocrinologist in June and will repeat TSH, T3, T4 labs as directed Patient will reach out to RN Care Coordinator [phone] with any care coordination or resource needs          SDOH assessments and interventions completed:  Yes  SDOH Interventions Today    Flowsheet Row Most Recent Value  SDOH Interventions   Transportation Interventions Intervention Not Indicated  Physical Activity Interventions Patient Declined        Care Coordination Interventions:  Yes, provided  Interventions Today    Flowsheet Row Most Recent Value  Chronic Disease   Chronic disease during today's visit Hypertension (HTN), Other  [SVT, Orthostatic Hypotension]  General Interventions   General Interventions Discussed/Reviewed General Interventions Discussed, General Interventions Reviewed, Communication with, Doctor Visits, Durable Medical Equipment (DME)  Doctor Visits Discussed/Reviewed Doctor Visits Reviewed, Doctor Visits Discussed, Specialist, PCP  Durable Medical Equipment (DME) BP Cuff  PCP/Specialist Visits Compliance with follow-up visit  [communicate with PCP re: blood pressure management]  Communication with PCP/Specialists  [cardio, Dr Eden Emms, re: clonidine dosage. Can increase from BID to TID to manage hypertension. Per Dr Eden Emms, needs to f/u with cardio for HTN management]  Education Interventions   Education Provided Provided Education  Provided Verbal Education On Medication, When to see the doctor, Mental Health/Coping with Illness  Pharmacy Interventions   Pharmacy Dicussed/Reviewed Medications and their functions, Pharmacy Topics Discussed, Pharmacy Topics Reviewed  Safety Interventions   Safety Discussed/Reviewed Safety Discussed, Safety Reviewed, Fall Risk       Follow up plan: Follow up call scheduled for 12/22/22    Encounter Outcome:  Pt. Visit Completed   Demetrios Loll, BSN, RN-BC RN Care Coordinator [facility]  [facility] Direct Dial:  [phone] Main #: [phone]

## 2022-12-19 ENCOUNTER — Telehealth: Payer: Self-pay | Admitting: "Endocrinology

## 2022-12-19 ENCOUNTER — Telehealth: Payer: Self-pay | Admitting: Internal Medicine

## 2022-12-19 DIAGNOSIS — E0590 Thyrotoxicosis, unspecified without thyrotoxic crisis or storm: Secondary | ICD-10-CM

## 2022-12-19 NOTE — Telephone Encounter (Signed)
STAT if HR is under 50 or over 120 (normal HR is 60-100 beats per minute)  What is your heart rate? 56  Do you have a log of your heart rate readings (document readings)? 56, 80, 100  Do you have any other symptoms? Patient states he had an ablation done last week on Tuesday.  Has had a couple of episodes of SVT. Patient states he feels weak, legs feel weak. Head hurts, has weird headaches that comes on behind his ears, lower head sometimes top of his head, down his neck.  He said his jaw hurts and teeth ache.  States the only time he feels normal is when he is laying down.  He wants to be mobile.  He states when he has bowel movement, he makes his heart beat fast.

## 2022-12-19 NOTE — Telephone Encounter (Signed)
Left a message requesting pt return call to the office. 

## 2022-12-19 NOTE — Telephone Encounter (Signed)
Pt states he's experiencing increased weakness,fatigue, night sweats, rapid heart rate and pain in bilateral sides of neck below his ears. Pt is taking methimazole 10mg  daily. Please advise.

## 2022-12-19 NOTE — Telephone Encounter (Signed)
Pt called back per message received in [facility] Triage.   Pt stated since his Ablation on 5/21, he feels weaker, and has difficulties standing long periods of time.  Pt also c/o of fatigue and sleeping more than he used to.  Pt states he has experienced X3 SVT since his ablation, one being in the hospital.     Pt shared that he felt X2 episodes of SVT last week.  One occurred in the morning, and one in the later in the day.  The later episode he was trying to have a BM, and felt the palpitations.  Pt states he is eating food that help him have an easier BM, so this does not happen.    Since Pt is feeling weaker, and having difficulties with energy level, and since he has 2 episodes of SVT / Palpitations, with symptoms, pt was scheduled to see Jari Favre PA-C on 5/31.  Pt appreciated the opportunity to meet with Ms. Asa Lente to discuss his concerns, and review his med list.  Pt advised if his symptoms worsen to call [facility] back, or go to the nearest ER for providers care.   Pt verbalized understanding.

## 2022-12-19 NOTE — Telephone Encounter (Signed)
Pt called and stated his neck was hurting and he was having night sweats and feels like its related to his thyroid.  Pt stated he would like a call back.

## 2022-12-20 ENCOUNTER — Telehealth (INDEPENDENT_AMBULATORY_CARE_PROVIDER_SITE_OTHER): Payer: Self-pay

## 2022-12-20 DIAGNOSIS — E0590 Thyrotoxicosis, unspecified without thyrotoxic crisis or storm: Secondary | ICD-10-CM | POA: Diagnosis not present

## 2022-12-20 NOTE — Telephone Encounter (Signed)
Pt called and spoke with Manessa Buley C. He says he wants to back to [facility]. Pt was given number to contact the office and if he needs a referral to let us know and one will be sent.

## 2022-12-20 NOTE — Telephone Encounter (Signed)
Spoke with pt and gave him number to [facility] so that he can schedule an appt with them.

## 2022-12-20 NOTE — Telephone Encounter (Signed)
Patient has left several messages on vm at [facility]. We forwarded these to [facility] last week, and then again today. 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 Please call patient. Thanks

## 2022-12-20 NOTE — Telephone Encounter (Signed)
Spoke with pt, advised him to have his lab work drawn early per Dr.Nida's orders. Understanding voiced.

## 2022-12-21 ENCOUNTER — Emergency Department (HOSPITAL_COMMUNITY)
Admission: EM | Admit: 2022-12-21 | Discharge: 2022-12-21 | Disposition: A | Discharge status: Home / Self Care | Payer: Medicare HMO | Attending: Emergency Medicine | Admitting: Emergency Medicine

## 2022-12-21 ENCOUNTER — Other Ambulatory Visit: Payer: Self-pay

## 2022-12-21 ENCOUNTER — Encounter (HOSPITAL_COMMUNITY): Payer: Self-pay | Admitting: Emergency Medicine

## 2022-12-21 DIAGNOSIS — R001 Bradycardia, unspecified: Secondary | ICD-10-CM | POA: Diagnosis not present

## 2022-12-21 DIAGNOSIS — R002 Palpitations: Secondary | ICD-10-CM | POA: Diagnosis not present

## 2022-12-21 DIAGNOSIS — Z91040 Latex allergy status: Secondary | ICD-10-CM | POA: Diagnosis not present

## 2022-12-21 DIAGNOSIS — R6889 Other general symptoms and signs: Secondary | ICD-10-CM | POA: Diagnosis not present

## 2022-12-21 DIAGNOSIS — K627 Radiation proctitis: Secondary | ICD-10-CM | POA: Diagnosis not present

## 2022-12-21 DIAGNOSIS — I499 Cardiac arrhythmia, unspecified: Secondary | ICD-10-CM | POA: Diagnosis not present

## 2022-12-21 DIAGNOSIS — R531 Weakness: Secondary | ICD-10-CM | POA: Diagnosis not present

## 2022-12-21 DIAGNOSIS — Z743 Need for continuous supervision: Secondary | ICD-10-CM | POA: Diagnosis not present

## 2022-12-21 LAB — COMPREHENSIVE METABOLIC PANEL
ALT: 15 U/L (ref 0–44)
AST: 15 U/L (ref 15–41)
Albumin: 3.5 g/dL (ref 3.5–5.0)
Alkaline Phosphatase: 61 U/L (ref 38–126)
Anion gap: 6 (ref 5–15)
BUN: 6 mg/dL — ABNORMAL LOW (ref 8–23)
CO2: 27 mmol/L (ref 22–32)
Calcium: 9.4 mg/dL (ref 8.9–10.3)
Chloride: 104 mmol/L (ref 98–111)
Creatinine, Ser: 0.76 mg/dL (ref 0.61–1.24)
GFR, Estimated: >60 mL/min (ref >60)
Glucose, Bld: 108 mg/dL — ABNORMAL HIGH (ref 70–99)
Potassium: 3.5 mmol/L (ref 3.5–5.1)
Sodium: 137 mmol/L (ref 135–145)
Total Bilirubin: 0.6 mg/dL (ref 0.3–1.2)
Total Protein: 6.5 g/dL (ref 6.5–8.1)

## 2022-12-21 LAB — CBC WITH DIFFERENTIAL/PLATELET
Abs Immature Granulocytes: 0 10*3/uL (ref 0.00–0.07)
Basophils Absolute: 0 10*3/uL (ref 0.0–0.1)
Basophils Relative: 1 %
Eosinophils Absolute: 0.1 10*3/uL (ref 0.0–0.5)
Eosinophils Relative: 3 %
HCT: 37.8 % — ABNORMAL LOW (ref 39.0–52.0)
Hemoglobin: 12.6 g/dL — ABNORMAL LOW (ref 13.0–17.0)
Immature Granulocytes: 0 %
Lymphocytes Relative: 44 %
Lymphs Abs: 1.6 10*3/uL (ref 0.7–4.0)
MCH: 29.8 pg (ref 26.0–34.0)
MCHC: 33.3 g/dL (ref 30.0–36.0)
MCV: 89.4 fL (ref 80.0–100.0)
Monocytes Absolute: 0.4 10*3/uL (ref 0.1–1.0)
Monocytes Relative: 10 %
Neutro Abs: 1.5 10*3/uL — ABNORMAL LOW (ref 1.7–7.7)
Neutrophils Relative %: 42 %
Platelets: 182 10*3/uL (ref 150–400)
RBC: 4.23 MIL/uL (ref 4.22–5.81)
RDW: 13.1 % (ref 11.5–15.5)
WBC: 3.6 10*3/uL — ABNORMAL LOW (ref 4.0–10.5)
nRBC: 0 % (ref 0.0–0.2)

## 2022-12-21 LAB — T3, FREE: T3, Free: 3 pg/mL (ref 2.0–4.4)

## 2022-12-21 LAB — TSH: TSH: 3.95 u[IU]/mL (ref 0.450–4.500)

## 2022-12-21 LAB — T4, FREE: Free T4: 1.1 ng/dL (ref 0.82–1.77)

## 2022-12-21 MED ORDER — ACETAMINOPHEN 325 MG PO TABS
650.0000 mg | ORAL_TABLET | Freq: Once | ORAL | Status: AC
  Administered 2022-12-21: 650 mg via ORAL
  Filled 2022-12-21: qty 2

## 2022-12-21 MED ORDER — POTASSIUM CHLORIDE CRYS ER 20 MEQ PO TBCR
40.0000 meq | EXTENDED_RELEASE_TABLET | Freq: Once | ORAL | Status: AC
  Administered 2022-12-21: 40 meq via ORAL
  Filled 2022-12-21: qty 2

## 2022-12-21 NOTE — Discharge Instructions (Signed)
Increase your Lopressor so you are back to taking 25 mg twice a day and keep your appointment tomorrow

## 2022-12-21 NOTE — ED Triage Notes (Addendum)
Pt reports he felt he was in afib this morning. He called 911. EMS showed sinus brady. Pt no longer feels he is in afib. Pt reports an ablation last week. Pt c/o a "stuffy nose" and pain on his lower head and in the back of his neck of 3/10.

## 2022-12-21 NOTE — Telephone Encounter (Signed)
Patient completed his labs. Please Advise

## 2022-12-21 NOTE — Telephone Encounter (Signed)
Spoke with pt, advised him his thyroid labs are right where Dr.Nida wants them to be. Understanding voiced per pt. Advised him to contact his PCP regarding symptoms. Pt stated he is in the ER at the time of call being evaluated.

## 2022-12-21 NOTE — ED Provider Notes (Signed)
[facility]ERGENCY DEPARTMENT AT [facility] Provider Note   CSN: [id number] Arrival date & time: 12/21/22  1057     History  Chief Complaint  Patient presents with   Irregular Heart Beat    [name] is a 72 y.o. male.  Patient states he had 2 episodes where his heart was beating fast and he felt dizzy.  Both episodes lasted less than a minute.  He recently had an ablation done for SVT  The history is provided by the patient and medical records.  Palpitations Palpitations quality:  Regular Onset quality:  Sudden Timing:  Sporadic Progression:  Resolved Chronicity:  Recurrent Context: not anxiety   Relieved by:  Nothing Worsened by:  Nothing Ineffective treatments:  None tried Associated symptoms: no back pain, no chest pain and no cough        Home Medications Prior to Admission medications   Medication Sig Start Date End Date Taking? Authorizing Provider  acetaminophen (TYLENOL) 500 MG tablet Take 500 mg by mouth every 6 (six) hours as needed for moderate pain.    [provider]  alfuzosin (UROXATRAL) 10 MG 24 hr tablet Take 1 tablet (10 mg total) by mouth daily with breakfast. 04/07/22   [name], Mardene Celeste, MD  buPROPion (WELLBUTRIN XL) 150 MG 24 hr tablet Take 150 mg by mouth daily.    Elfredia Nevins, MD  Cholecalciferol (VITAMIN D3) 125 MCG (5000 UT) CAPS Take 1 capsule by mouth daily.     [provider]  cloNIDine (CATAPRES) 0.1 MG tablet Take 1 tablet (0.1 mg total) by mouth 2 (two) times daily as needed (SBP>160). Patient taking differently: Take 0.1 mg by mouth 3 (three) times daily. 11/27/22   Sherryll Burger, Pratik D, DO  ELIQUIS 5 MG TABS tablet TAKE 1 TABLET(5 MG) BY MOUTH TWICE DAILY Patient taking differently: Take 5 mg by mouth 2 (two) times daily. 10/18/22   Wendall Stade, MD  furosemide (LASIX) 20 MG tablet Take 1 tablet (20 mg total) by mouth daily. Take with or after meal. 11/07/22   Marinus Maw, MD  gabapentin  (NEURONTIN) 300 MG capsule Take 300 mg by mouth 3 (three) times daily as needed (nerve pain). 02/24/20   [provider]  Galcanezumab-gnlm (EMGALITY) 120 MG/ML SOAJ Inject 1 Pen into the skin every 30 (thirty) days. 09/25/22   Ocie Doyne, MD  hydrALAZINE (APRESOLINE) 25 MG tablet Take 25 mg by mouth 3 (three) times daily. 10/09/22   [provider]  hydrOXYzine (ATARAX) 10 MG tablet Take 1 tablet (10 mg total) by mouth 3 (three) times daily as needed for anxiety. 11/26/22   Sherryll Burger, Pratik D, DO  losartan (COZAAR) 100 MG tablet Take 100 mg by mouth daily.    [provider]  methimazole (TAPAZOLE) 10 MG tablet Take 1 tablet (10 mg total) by mouth daily with breakfast. 10/16/22   Nida, Denman George, MD  metoprolol tartrate (LOPRESSOR) 25 MG tablet Take 0.5 tablets (12.5 mg total) by mouth 2 (two) times daily. 12/04/22 01/03/23  Jacalyn Lefevre, MD  NURTEC 75 MG TBDP Take 75 mg by mouth daily as needed (pain). 08/14/22   [provider]  ondansetron (ZOFRAN-ODT) 4 MG disintegrating tablet Take 1 tablet (4 mg total) by mouth every 8 (eight) hours as needed for nausea or vomiting. 08/23/22   Vassie Loll, MD  polyethylene glycol (MIRALAX / GLYCOLAX) 17 g packet Take 17 g by mouth daily. 08/23/22   Vassie Loll, MD  potassium  chloride SA (KLOR-CON M) 20 MEQ tablet Take 1 tablet (20 mEq total) by mouth daily. 12/04/22   Jacalyn Lefevre, MD  Probiotic Product (PROBIOTIC-10 PO) Take 1 capsule by mouth daily.    [provider]  traZODone (DESYREL) 50 MG tablet Take 50-150 mg by mouth at bedtime. 11/14/22   [provider]  Ubrogepant (UBRELVY) 100 MG TABS Take 1 tablet (100 mg total) by mouth as needed (for headaches). May repeat a dose in 2 hours if needed. Max dose 2 pills in 24 hours 09/25/22   Ocie Doyne, MD  vitamin B-12 (CYANOCOBALAMIN) 100 MCG tablet Take 100 mcg by mouth daily.    [provider]      Allergies    Latex and Meclizine     Review of Systems   Review of Systems  Constitutional:  Negative for appetite change and fatigue.  HENT:  Negative for congestion, ear discharge and sinus pressure.   Eyes:  Negative for discharge.  Respiratory:  Negative for cough.   Cardiovascular:  Positive for palpitations. Negative for chest pain.  Gastrointestinal:  Negative for abdominal pain and diarrhea.  Genitourinary:  Negative for frequency and hematuria.  Musculoskeletal:  Negative for back pain.  Skin:  Negative for rash.  Neurological:  Negative for seizures and headaches.  Psychiatric/Behavioral:  Negative for hallucinations.     Physical Exam Updated Vital Signs BP (!) 165/79   Pulse (!) 49   Temp 98.1 F (36.7 C)   Resp 15   Ht 5\' 7"  (1.702 m)   Wt 81.6 kg   SpO2 98%   BMI 28.19 kg/m  Physical Exam Vitals and nursing note reviewed.  Constitutional:      Appearance: He is well-developed.  HENT:     Head: Normocephalic.     Nose: Nose normal.  Eyes:     General: No scleral icterus.    Conjunctiva/sclera: Conjunctivae normal.  Neck:     Thyroid: No thyromegaly.  Cardiovascular:     Rate and Rhythm: Normal rate and regular rhythm.     Heart sounds: No murmur heard.    No friction rub. No gallop.  Pulmonary:     Breath sounds: No stridor. No wheezing or rales.  Chest:     Chest wall: No tenderness.  Abdominal:     General: There is no distension.     Tenderness: There is no abdominal tenderness. There is no rebound.  Musculoskeletal:        General: Normal range of motion.     Cervical back: Neck supple.  Lymphadenopathy:     Cervical: No cervical adenopathy.  Skin:    Findings: No erythema or rash.  Neurological:     Mental Status: He is alert and oriented to person, place, and time.     Motor: No abnormal muscle tone.     Coordination: Coordination normal.  Psychiatric:        Behavior: Behavior normal.     ED Results / Procedures / Treatments   Labs (all labs ordered are listed,  but only abnormal results are displayed) Labs Reviewed  CBC WITH DIFFERENTIAL/PLATELET - Abnormal; Notable for the following components:      Result Value   WBC 3.6 (*)    Hemoglobin 12.6 (*)    HCT 37.8 (*)    Neutro Abs 1.5 (*)    All other components within normal limits  COMPREHENSIVE METABOLIC PANEL - Abnormal; Notable for the following components:   Glucose, Bld 108 (*)  BUN 6 (*)    All other components within normal limits    EKG None  Radiology No results found.  Procedures Procedures    Medications Ordered in ED Medications  potassium chloride SA (KLOR-CON M) CR tablet 40 mEq (has no administration in time range)  acetaminophen (TYLENOL) tablet 650 mg (650 mg Oral Given 12/21/22 1319)    ED Course/ Medical Decision Making/ A&P                             Medical Decision Making Amount and/or Complexity of Data Reviewed Labs: ordered.  Risk OTC drugs. Prescription drug management.  Palpitations.  Most likely related to SVT.  Patient is increasing his Lopressor and will follow-up with cardiology tomorrow        Final Clinical Impression(s) / ED Diagnoses Final diagnoses:  Palpitations    Rx / DC Orders ED Discharge Orders     None         Bethann Berkshire, MD 12/24/22 1334

## 2022-12-22 ENCOUNTER — Ambulatory Visit: Payer: Self-pay | Admitting: *Deleted

## 2022-12-22 ENCOUNTER — Encounter: Payer: Self-pay | Admitting: *Deleted

## 2022-12-22 ENCOUNTER — Encounter: Payer: Self-pay | Admitting: Physician Assistant

## 2022-12-22 ENCOUNTER — Ambulatory Visit: Payer: Medicare HMO | Attending: Physician Assistant | Admitting: Physician Assistant

## 2022-12-22 VITALS — BP 138/80 | HR 64 | Ht 67.0 in | Wt 177.6 lb

## 2022-12-22 DIAGNOSIS — I10 Essential (primary) hypertension: Secondary | ICD-10-CM

## 2022-12-22 DIAGNOSIS — I471 Supraventricular tachycardia, unspecified: Secondary | ICD-10-CM

## 2022-12-22 DIAGNOSIS — I480 Paroxysmal atrial fibrillation: Secondary | ICD-10-CM

## 2022-12-22 MED ORDER — METOPROLOL TARTRATE 25 MG PO TABS: 25.0000 mg | ORAL_TABLET | Freq: Two times a day (BID) | ORAL | 2 refills | Status: DC

## 2022-12-22 NOTE — Patient Instructions (Signed)
Medication Instructions:   Your physician recommends that you continue on your current medications as directed. Please refer to the Current Medication list given to you today.  *If you need a refill on your cardiac medications before your next appointment, please call your pharmacy*   Lab Work: NONE ORDERED  TODAY    If you have labs (blood work) drawn today and your tests are completely normal, you will receive your results only by: MyChart Message (if you have MyChart) OR A paper copy in the mail If you have any lab test that is abnormal or we need to change your treatment, we will call you to review the results.   Testing/Procedures: NONE ORDERED  TODAY      Follow-Up: At [facility], you and your health needs are our priority.  As part of our continuing mission to provide you with exceptional heart care, we have created designated Provider Care Teams.  These Care Teams include your primary Cardiologist (physician) and Advanced Practice Providers (APPs -  Physician Assistants and Nurse Practitioners) who all work together to provide you with the care you need, when you need it.  We recommend signing up for the patient portal called "MyChart".  Sign up information is provided on this After Visit Summary.  MyChart is used to connect with patients for Virtual Visits (Telemedicine).  Patients are able to view lab/test results, encounter notes, upcoming appointments, etc.  Non-urgent messages can be sent to your provider as well.   To learn more about what you can do with MyChart, go to [email or web address].    Your next appointment:  ASAP  WITH Provider:    Lewayne Bunting, MD    Other Instructions  Low-Sodium Eating Plan Salt (sodium) helps you keep a healthy balance of fluids in your body. Too much sodium can raise your blood pressure. It can also cause fluid and waste to be held in your body. Your health care provider or dietitian may recommend a low-sodium eating  plan if you have high blood pressure (hypertension), kidney disease, liver disease, or heart failure. Eating less sodium can help lower your blood pressure and reduce swelling. It can also protect your heart, liver, and kidneys. What are tips for following this plan? Reading food labels  Check food labels for the amount of sodium per serving. If you eat more than one serving, you must multiply the listed amount by the number of servings. Choose foods with less than 140 milligrams (mg) of sodium per serving. Avoid foods with 300 mg of sodium or more per serving. Always check how much sodium is in a product, even if the label says "unsalted" or "no salt added." Shopping  Buy products labeled as "low-sodium" or "no salt added." Buy fresh foods. Avoid canned foods and pre-made or frozen meals. Avoid canned, cured, or processed meats. Buy breads that have less than 80 mg of sodium per slice. Cooking  Eat more home-cooked food. Try to eat less restaurant, buffet, and fast food. Try not to add salt when you cook. Use salt-free seasonings or herbs instead of table salt or sea salt. Check with your provider or pharmacist before using salt substitutes. Cook with plant-based oils, such as canola, sunflower, or olive oil. Meal planning When eating at a restaurant, ask if your food can be made with less salt or no salt. Avoid dishes labeled as brined, pickled, cured, or smoked. Avoid dishes made with soy sauce, miso, or teriyaki sauce. Avoid foods that have monosodium glutamate (  MSG) in them. MSG may be added to some restaurant food, sauces, soups, bouillon, and canned foods. Make meals that can be grilled, baked, poached, roasted, or steamed. These are often made with less sodium. General information Try to limit your sodium intake to 1,500-2,300 mg each day, or the amount told by your provider. What foods should I eat? Fruits Fresh, frozen, or canned fruit. Fruit juice. Vegetables Fresh or frozen  vegetables. "No salt added" canned vegetables. "No salt added" tomato sauce and paste. Low-sodium or reduced-sodium tomato and vegetable juice. Grains Low-sodium cereals, such as oats, puffed wheat and rice, and shredded wheat. Low-sodium crackers. Unsalted rice. Unsalted pasta. Low-sodium bread. Whole grain breads and whole grain pasta. Meats and other proteins Fresh or frozen meat, poultry, seafood, and fish. These should have no added salt. Low-sodium canned tuna and salmon. Unsalted nuts. Dried peas, beans, and lentils without added salt. Unsalted canned beans. Eggs. Unsalted nut butters. Dairy Milk. Soy milk. Cheese that is naturally low in sodium, such as ricotta cheese, fresh mozzarella, or Swiss cheese. Low-sodium or reduced-sodium cheese. Cream cheese. Yogurt. Seasonings and condiments Fresh and dried herbs and spices. Salt-free seasonings. Low-sodium mustard and ketchup. Sodium-free salad dressing. Sodium-free light mayonnaise. Fresh or refrigerated horseradish. Lemon juice. Vinegar. Other foods Homemade, reduced-sodium, or low-sodium soups. Unsalted popcorn and pretzels. Low-salt or salt-free chips. The items listed above may not be all the foods and drinks you can have. Talk to a dietitian to learn more. What foods should I avoid? Vegetables Sauerkraut, pickled vegetables, and relishes. Olives. [place] fries. Onion rings. Regular canned vegetables, except low-sodium or reduced-sodium items. Regular canned tomato sauce and paste. Regular tomato and vegetable juice. Frozen vegetables in sauces. Grains Instant hot cereals. Bread stuffing, pancake, and biscuit mixes. Croutons. Seasoned rice or pasta mixes. Noodle soup cups. Boxed or frozen macaroni and cheese. Regular salted crackers. Self-rising flour. Meats and other proteins Meat or fish that is salted, canned, smoked, spiced, or pickled. Precooked or cured meat, such as sausages or meat loaves. Tomasa Blase. Ham. Pepperoni. Hot dogs. Corned  beef. Chipped beef. Salt pork. Jerky. Pickled herring, anchovies, and sardines. Regular canned tuna. Salted nuts. Dairy Processed cheese and cheese spreads. Hard cheeses. Cheese curds. Blue cheese. Feta cheese. String cheese. Regular cottage cheese. Buttermilk. Canned milk. Fats and oils Salted butter. Regular margarine. Ghee. Bacon fat. Seasonings and condiments Onion salt, garlic salt, seasoned salt, table salt, and sea salt. Canned and packaged gravies. Worcestershire sauce. Tartar sauce. Barbecue sauce. Teriyaki sauce. Soy sauce, including reduced-sodium soy sauce. Steak sauce. Fish sauce. Oyster sauce. Cocktail sauce. Horseradish that you find on the shelf. Regular ketchup and mustard. Meat flavorings and tenderizers. Bouillon cubes. Hot sauce. Pre-made or packaged marinades. Pre-made or packaged taco seasonings. Relishes. Regular salad dressings. Salsa. Other foods Salted popcorn and pretzels. Corn chips and puffs. Potato and tortilla chips. Canned or dried soups. Pizza. Frozen entrees and pot pies. The items listed above may not be all the foods and drinks you should avoid. Talk to a dietitian to learn more. This information is not intended to replace advice given to you by your health care provider. Make sure you discuss any questions you have with your health care provider. Document Revised: 07/27/2022 Document Reviewed: 07/27/2022 Elsevier Patient Education  2024 [employer].

## 2022-12-22 NOTE — Patient Outreach (Signed)
  Care Coordination   Follow Up Visit Note   12/22/2022 Name: Steven [name] MRN: [id number] DOB: 1951/01/01  Steven [name] is a 72 y.o. year old male who sees Elfredia Nevins, MD for primary care. I spoke with  Steven [name] by phone today.  What matters to the patients health and wellness today?  Following up with cardiologist re: irregular heart rhythm     Goals Addressed             This Visit's Progress    Manage Blood Pressure & Heart Rate/Rhythm       Care Coordination Goals: Patient will take medications as directed and report any negative side effects to provider  Patient will talk with PCP about antihypertensives and possibly changing dosage or directions to better manage hypertension Clonidine, losartan, metoprolol, furosemide, hydralazine  Per Dr Eden Emms, patient can increase clonidine .1mg  to 3 times a day Patient will use a pill box/organizer to help keep up with when to take medications Patient will monitor and record blood pressure and heart rate twice and as needed and will call PCP with any readings outside of recommended range Patient will document any symptoms on blood pressure/heart rate log Patient will keep all recommended follow-up appointments with PCP and cardiologist Patient is on his way to cardiology appointment at time of call Patient will keep all medical procedure appointments Recent ablation for SVT ED visit yesterday Patient will take blood pressure log to PCP and specialty appointments for review Patient will follow a low sodium/DASH diet  Patient will state understanding of techniques (like valsalva maneuver) to manage SVT Patient will move carefully and change positions slowly to decrease risk of orthostatic hypotension and falls Patient will state understanding of the role uncontrolled hyperthyroidism can play in hypertension and tachycardia Patient will continue to take methimazole as directed  Patient will keep f/u with endocrinologist in  June and will repeat TSH, T3, T4 labs as directed Patient will reach out to RN Care Coordinator [phone] with any care coordination or resource needs          SDOH assessments and interventions completed:  Yes    SDOH Interventions Today    Flowsheet Row Most Recent Value  SDOH Interventions   Transportation Interventions Intervention Not Indicated  Financial Strain Interventions Intervention Not Indicated       Care Coordination Interventions:  Yes, provided  Interventions Today    Flowsheet Row Most Recent Value  Chronic Disease   Chronic disease during today's visit Atrial Fibrillation (AFib), Other, Hypertension (HTN)  [SVT with recent ablation]  General Interventions   General Interventions Discussed/Reviewed General Interventions Discussed, General Interventions Reviewed, Doctor Visits  Doctor Visits Discussed/Reviewed Doctor Visits Discussed, Doctor Visits Reviewed, Specialist  [has appointment with cardio today]  PCP/Specialist Visits Compliance with follow-up visit  Pharmacy Interventions   Pharmacy Dicussed/Reviewed Pharmacy Topics Reviewed, Pharmacy Topics Discussed  Safety Interventions   Safety Discussed/Reviewed Safety Discussed, Safety Reviewed       Follow up plan: Follow up call scheduled for 01/01/23    Encounter Outcome:  Pt. Visit Completed   Demetrios Loll, BSN, RN-BC RN Care Coordinator [facility]  [facility] Direct Dial: [phone] Main #: [phone]

## 2022-12-22 NOTE — Progress Notes (Signed)
Office Visit    Patient Name: Steven [name] Date of Encounter: 12/22/2022  PCP:  Elfredia Nevins, MD   [facility]  Cardiologist:  Charlton Haws, MD  Advanced Practice Provider:  No care team member to display Electrophysiologist:  None   HPI    Steven [name] is a 72 y.o. male with a past medical history of SVT, hypertension, some degree of orthostasis presents today for follow-up appointment.  He was last seen 11/07/2022 and at that time he has had multiple ED visits for SVT and had worn a cardiac monitor which demonstrated SVT rates up to 218 bpm.  He denied chest pain.  Had not had any syncope.  When he goes into SVT he does have shortness of breath.  Also has a history of PAF but unclear if he had SVT induced by atrial fibrillation.  Has been on a beta-blocker but heart rate on the low side and blood pressure had been elevated.  Last seen by Dr. Ladona Ridgel and the plan was for SVT ablation.  Patient was deciding if he would like to go forward.  His metoprolol succinate was reduced and the suggestion of switching to carvedilol was introduced.  Today, he tells me that he has had a few episodes since leaving the hospital.  His heart rate usually increases with bowel movements.  Procedure was 5/21 for his SVT ablation and he does not feel any better.  He has frequent headaches and his PCP called in meloxicam.  He has not taken this medication because he is worried about the side effects.  I advised him not to take this medication since he is taking Eliquis.  It would be okay for him to start a muscle relaxer but I will let primary handle that.  Blood pressure has been up and down per report.  He says his blood pressure has been as high as 200/100 at home.  Today 138/80.  He states that he still is having weakness and his legs are interested when walking around.  He is upset because he thought he was supposed to feel better after ablation and feels worse.  He takes fiber  supplements and tries not to strain all using the bathroom.  He has been maintaining hydration at greater than 64 ounces daily he avoids alcohol and caffeine.  When he goes in SVT at last usually 15 to 20 minutes.  We provided him with an extra half a pill of metoprolol to take as needed.  He does have chest pain/pressure with his episodes of SVT.  Usually has weakness afterwards.   No edema, orthopnea, PND.   Past Medical History    Past Medical History:  Diagnosis Date   Anxiety    Arthritis    Atrial fibrillation (HCC)    Atrial flutter (HCC)    BPH (benign prostatic hyperplasia)    Chest pain    Dizziness    Dyspnea    laying down occ   Fracture 08/17/2015   MULTIPLE RIB FRACTURES     FROM FALL    GERD (gastroesophageal reflux disease)    Hemorrhoids    History of kidney stones    noted on CT scan   History of radiation therapy 08/22/11-10/13/11   prostate   Hyperlipemia    Hypertension    Hyperthyroidism    IBS (irritable bowel syndrome)    Insomnia    Light headedness    Migraine    Numbness and tingling in  left arm    Numbness and tingling of both legs    Open fracture of left elbow 08/18/2015   Prostate cancer [facility])    prostate s/p radiation Mar 2013   Rib fractures 08/17/2015   Tinnitus    Past Surgical History:  Procedure Laterality Date   BOTOX INJECTION N/A 01/14/2020   Procedure: INJECTION OF BOTOX INTO ANAL SPHINCTER;  Surgeon: Andria Meuse, MD;  Location: [facility];  Service: General;  Laterality: N/A;   COLONOSCOPY N/A 12/19/2012   ZOX:WRUEAV 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 Normal appearing colon   COLONOSCOPY WITH PROPOFOL N/A 10/23/2019   Procedure: COLONOSCOPY WITH PROPOFOL;  Surgeon: Corbin Ade, MD; external and grade 2 internal hemorrhoids, abnormal rectal blood vessels consistent with radiation proctitis s/p APC therapy, otherwise normal exam.   EVALUATION UNDER ANESTHESIA  WITH ANAL FISTULECTOMY N/A 01/14/2020   Procedure: ANORECTAL EXAM UNDER ANESTHESIA;  Surgeon: Andria Meuse, MD;  Location: [facility];  Service: General;  Laterality: N/A;   HOT HEMOSTASIS  10/23/2019   Procedure: HOT HEMOSTASIS (ARGON PLASMA COAGULATION/BICAP);  Surgeon: Corbin Ade, MD;  Location: [facility] SUITE;  Service: Endoscopy;;  apc rectal proctitis     KIDNEY SURGERY  1982   kidney tube collapse repair   RADIOACTIVE SEED IMPLANT     Prostate   SVT ABLATION N/A 12/12/2022   Procedure: SVT ABLATION;  Surgeon: Marinus Maw, MD;  Location: [facility] INVASIVE CV LAB;  Service: Cardiovascular;  Laterality: N/A;    Allergies  Allergies  Allergen Reactions   Latex Rash    Possible reaction to latex gloves per patient   Meclizine     Other reaction(s): Abdominal Pain, Other    EKGs/Labs/Other Studies Reviewed:   The following studies were reviewed today: Cardiac Studies & Procedures     STRESS TESTS  NM MYOCAR MULTI W/SPECT W 09/20/2022  Narrative   Stress ECG is negative for ischemia.   LV perfusion is normal. There is no evidence of ischemia. There is no evidence of infarction.   Left ventricular function is normal. Nuclear stress EF: 78 %.   The study is normal. The study is low risk.   ECHOCARDIOGRAM  ECHOCARDIOGRAM COMPLETE 08/22/2022  Narrative ECHOCARDIOGRAM REPORT    Patient Name:   Steven [name] Date of Exam: 08/22/2022 Medical Rec #:  [id number]      Height:       67.0 in Accession #:    [id number]     Weight:       186.6 lb Date of Birth:  [birth year]      BSA:          1.964 m Patient Age:    72 years       BP:           128/77 mmHg Patient Gender: M              HR:           62 bpm. [facility]  Procedure: 2D Echo, Cardiac Doppler and Color Doppler  Indications:    Atrial Flutter I48.92  History:        Patient has prior history of Echocardiogram examinations, most recent 03/02/2019. Risk Factors:Hypertension and  Dyslipidemia.  Sonographer:    Celesta Gentile RCS Referring Phys: [id number] ALLISON WOLFE  IMPRESSIONS   1. Left ventricular ejection fraction, by estimation, is 60 to 65%. The left ventricle has  normal function. The left ventricle has no regional wall motion abnormalities. Left ventricular diastolic parameters are consistent with Grade I diastolic dysfunction (impaired relaxation). 2. Right ventricular systolic function is normal. The right ventricular size is normal. Tricuspid regurgitation signal is inadequate for assessing PA pressure. 3. Left atrial size was mildly dilated. 4. The mitral valve is normal in structure. No evidence of mitral valve regurgitation. No evidence of mitral stenosis. 5. The aortic valve is tricuspid. There is moderate calcification of the aortic valve. Aortic valve regurgitation is not visualized. No aortic stenosis is present. 6. The inferior vena cava is normal in size with greater than 50% respiratory variability, suggesting right atrial pressure of 3 mmHg.  Comparison(s): No significant change from prior study.  FINDINGS Left Ventricle: Left ventricular ejection fraction, by estimation, is 60 to 65%. The left ventricle has normal function. The left ventricle has no regional wall motion abnormalities. The left ventricular internal cavity size was normal in size. There is no left ventricular hypertrophy. Left ventricular diastolic parameters are consistent with Grade I diastolic dysfunction (impaired relaxation).  Right Ventricle: The right ventricular size is normal. No increase in right ventricular wall thickness. Right ventricular systolic function is normal. Tricuspid regurgitation signal is inadequate for assessing PA pressure.  Left [facility]: Left atrial size was mildly dilated.  Right [facility]: Right atrial size was normal in size.  Pericardium: There is no evidence of pericardial effusion.  Mitral Valve: The mitral valve is normal in structure. No  evidence of mitral valve regurgitation. No evidence of mitral valve stenosis.  Tricuspid Valve: The tricuspid valve is grossly normal. Tricuspid valve regurgitation is not demonstrated. No evidence of tricuspid stenosis.  Aortic Valve: The aortic valve is tricuspid. There is moderate calcification of the aortic valve. Aortic valve regurgitation is not visualized. No aortic stenosis is present.  Pulmonic Valve: The pulmonic valve was not well visualized. Pulmonic valve regurgitation is mild. No evidence of pulmonic stenosis.  Aorta: The aortic root is normal in size and structure.  Venous: The inferior vena cava is normal in size with greater than 50% respiratory variability, suggesting right atrial pressure of 3 mmHg.  IAS/Shunts: No atrial level shunt detected by color flow Doppler.   LEFT VENTRICLE PLAX 2D LVIDd:         4.80 cm   Diastology LVIDs:         2.90 cm   LV e' medial:    7.83 cm/s LV PW:         0.90 cm   LV E/e' medial:  10.4 LV IVS:        0.90 cm   LV e' lateral:   8.59 cm/s LVOT diam:     1.80 cm   LV E/e' lateral: 9.5 LV SV:         79 LV SV Index:   40 LVOT Area:     2.54 cm   RIGHT VENTRICLE RV S prime:     11.50 cm/s TAPSE (M-mode): 2.1 cm  LEFT ATRIUM           Index        RIGHT ATRIUM           Index LA diam:      3.50 cm 1.78 cm/m   RA Area:     15.70 cm LA Vol (A4C): 72.9 ml 37.13 ml/m  RA Volume:   46.10 ml  23.48 ml/m AORTIC VALVE LVOT Vmax:   135.00 cm/s LVOT Vmean:  88.900 cm/s  LVOT VTI:    0.312 m  AORTA Ao Root diam: 3.30 cm  MITRAL VALVE MV Area (PHT): 4.06 cm     SHUNTS MV Decel Time: 187 msec     Systemic VTI:  0.31 m MV E velocity: 81.80 cm/s   Systemic Diam: 1.80 cm MV A velocity: 101.00 cm/s MV E/A ratio:  0.81  Vishnu Priya Mallipeddi Electronically signed by Winfield Rast Mallipeddi Signature Date/Time: 08/22/2022/1:18:59 PM    Final    MONITORS  CARDIAC EVENT MONITOR 09/21/2022  Narrative NSR PAC/PVC One  episode of SVT lasting > 1 minute HR > 200 auto-triggerred no symptoms NSR with no arrhythmias during symptoms of dizziness and palpitations  Charlton Haws MD [facility]            EKG:  EKG is  ordered today.  The ekg ordered today demonstrates normal sinus rhythm, 64 bpm  Recent Labs: 08/13/2022: B Natriuretic Peptide 8.0 12/04/2022: Magnesium 2.1 12/20/2022: TSH 3.950 12/21/2022: ALT 15; BUN 6; Creatinine, Ser 0.76; Hemoglobin 12.6; Platelets 182; Potassium 3.5; Sodium 137  Recent Lipid Panel    Component Value Date/Time   CHOL 142 04/16/2020 0000   TRIG 74 04/16/2020 0000   HDL 48 04/16/2020 0000   CHOLHDL 4.4 03/02/2019 0329   VLDL 18 03/02/2019 0329   LDLCALC 79 04/16/2020 0000    Risk Assessment/Calculations:   CHA2DS2-VASc Score = 2   This indicates a 2.2% annual risk of stroke. The patient's score is based upon: CHF History: 0 HTN History: 1 Diabetes History: 0 Stroke History: 0 Vascular Disease History: 0 Age Score: 1 Gender Score: 0   Home Medications   Current Meds  Medication Sig   acetaminophen (TYLENOL) 500 MG tablet Take 500 mg by mouth every 6 (six) hours as needed for moderate pain.   alfuzosin (UROXATRAL) 10 MG 24 hr tablet Take 1 tablet (10 mg total) by mouth daily with breakfast.   buPROPion (WELLBUTRIN XL) 150 MG 24 hr tablet Take 150 mg by mouth daily.   Cholecalciferol (VITAMIN D3) 125 MCG (5000 UT) CAPS Take 1 capsule by mouth daily.    cloNIDine (CATAPRES) 0.1 MG tablet Take 1 tablet (0.1 mg total) by mouth 2 (two) times daily as needed (SBP>160). (Patient taking differently: Take 0.1 mg by mouth 3 (three) times daily.)   ELIQUIS 5 MG TABS tablet TAKE 1 TABLET(5 MG) BY MOUTH TWICE DAILY (Patient taking differently: Take 5 mg by mouth 2 (two) times daily.)   furosemide (LASIX) 20 MG tablet Take 1 tablet (20 mg total) by mouth daily. Take with or after meal.   gabapentin (NEURONTIN) 300 MG capsule Take 300 mg by mouth 3 (three) times daily as needed  (nerve pain).   Galcanezumab-gnlm (EMGALITY) 120 MG/ML SOAJ Inject 1 Pen into the skin every 30 (thirty) days.   hydrALAZINE (APRESOLINE) 25 MG tablet Take 25 mg by mouth 3 (three) times daily.   hydrOXYzine (ATARAX) 10 MG tablet Take 1 tablet (10 mg total) by mouth 3 (three) times daily as needed for anxiety.   losartan (COZAAR) 100 MG tablet Take 100 mg by mouth daily.   meloxicam (MOBIC) 7.5 MG tablet Take 7.5 mg by mouth 2 (two) times daily as needed.   methimazole (TAPAZOLE) 10 MG tablet Take 1 tablet (10 mg total) by mouth daily with breakfast.   metoprolol tartrate (LOPRESSOR) 25 MG tablet Take 0.5 tablets (12.5 mg total) by mouth 2 (two) times daily.   NURTEC 75 MG TBDP Take 75 mg by  mouth daily as needed (pain).   ondansetron (ZOFRAN-ODT) 4 MG disintegrating tablet Take 1 tablet (4 mg total) by mouth every 8 (eight) hours as needed for nausea or vomiting.   polyethylene glycol (MIRALAX / GLYCOLAX) 17 g packet Take 17 g by mouth daily.   potassium chloride SA (KLOR-CON M) 20 MEQ tablet Take 1 tablet (20 mEq total) by mouth daily.   Probiotic Product (PROBIOTIC-10 PO) Take 1 capsule by mouth daily.   traZODone (DESYREL) 50 MG tablet Take 50-150 mg by mouth at bedtime.   Ubrogepant (UBRELVY) 100 MG TABS Take 1 tablet (100 mg total) by mouth as needed (for headaches). May repeat a dose in 2 hours if needed. Max dose 2 pills in 24 hours   vitamin B-12 (CYANOCOBALAMIN) 100 MCG tablet Take 100 mcg by mouth daily.     Review of Systems      All other systems reviewed and are otherwise negative except as noted above.  Physical Exam    VS:  BP 138/80   Pulse 64   Ht 5\' 7"  (1.702 m)   Wt 177 lb 9.6 oz (80.6 kg)   SpO2 96%   BMI 27.82 kg/m  , BMI Body mass index is 27.82 kg/m.  Wt Readings from Last 3 Encounters:  12/22/22 177 lb 9.6 oz (80.6 kg)  12/21/22 180 lb (81.6 kg)  12/12/22 180 lb (81.6 kg)     GEN: Well nourished, well developed, in no acute distress. HEENT:  normal. Neck: Supple, no JVD, carotid bruits, or masses. Cardiac: RRR, no murmurs, rubs, or gallops. No clubbing, cyanosis, edema.  Radials/PT 2+ and equal bilaterally.  Respiratory:  Respirations regular and unlabored, clear to auscultation bilaterally. GI: Soft, nontender, nondistended. MS: No deformity or atrophy. Skin: Warm and dry, no rash. Neuro:  Strength and sensation are intact. Psych: Normal affect.  Assessment & Plan    SVT s/p ablation -Continue current medications which include Eliquis 5 mg twice a day, clonidine 0.5 mg up to 4 times a day, Lasix 20 mg daily, hydralazine 25 mg 3 times a day, losartan 100 mg daily, Lopressor now on 25 mg twice a day, potassium 20 mEq daily -He can take an extra 12.5 mg of metoprolol for SVT occurrences -Discussed avoiding straining since this seems to be the aggravating factor -Discussed avoiding alcohol and caffeine -Discussed proper hydration greater than 64 ounces of water a day -Most recent lab work reviewed  PAF -Sometimes occurs as a precursor to his SVT -May also take an extra half of metoprolol if this occurs -Same precautions as above  Hypertension -Well-controlled today in the clinic, 138/80 Continue medication management clonidine 0.1 mg to 4 doses a day grams daily Lopressor 20 twice a day -Continue to track blood pressure an hour after morning medicine and evening medicines       Disposition: Follow up 3 months with Charlton Haws, MD or APP. Follow-up ASAP with EP  Signed, Sharlene Dory, PA-C 12/22/2022, 2:58 PM [facility]

## 2022-12-25 ENCOUNTER — Ambulatory Visit: Payer: Medicare HMO | Attending: Physician Assistant

## 2022-12-25 ENCOUNTER — Other Ambulatory Visit: Payer: Self-pay | Admitting: Physician Assistant

## 2022-12-25 DIAGNOSIS — I471 Supraventricular tachycardia, unspecified: Secondary | ICD-10-CM

## 2022-12-25 DIAGNOSIS — R197 Diarrhea, unspecified: Secondary | ICD-10-CM | POA: Diagnosis not present

## 2022-12-25 NOTE — Progress Notes (Unsigned)
Enrolled for [employer] to mail a ZIO XT long term holter monitor to the patients address on file.   Dr. Nishan to read. 

## 2022-12-27 ENCOUNTER — Telehealth: Payer: Self-pay | Admitting: *Deleted

## 2022-12-27 ENCOUNTER — Telehealth: Payer: Self-pay | Admitting: Internal Medicine

## 2022-12-27 ENCOUNTER — Encounter: Payer: Self-pay | Admitting: *Deleted

## 2022-12-27 DIAGNOSIS — I471 Supraventricular tachycardia, unspecified: Secondary | ICD-10-CM | POA: Diagnosis not present

## 2022-12-27 NOTE — Telephone Encounter (Signed)
Pt would like a callback regarding Heart monitor, as he has questions. Please advise

## 2022-12-27 NOTE — Telephone Encounter (Signed)
Spoke to patient who had questions about placement of cardiac monitor- patients questions were answered and patient advised if he had further questions to call heartcare office back.

## 2022-12-27 NOTE — Progress Notes (Signed)
  Care Coordination Note  12/27/2022 Name: [name] MRN: [id number] DOB: 15-Jun-1951  Steven [name] is a 72 y.o. year old male who is a primary care patient of Elfredia Nevins, MD and is actively engaged with the care management team. I reached out to [name] by phone today to assist with scheduling an initial visit with the Licensed Clinical Social Worker  Follow up plan: Telephone appointment with care management team member scheduled for:01/08/23 and follow up with [facility] 12/27/22  [facility] Coordination Care Guide  Direct Dial: [phone]

## 2022-12-28 DIAGNOSIS — K8020 Calculus of gallbladder without cholecystitis without obstruction: Secondary | ICD-10-CM | POA: Diagnosis not present

## 2022-12-28 DIAGNOSIS — R197 Diarrhea, unspecified: Secondary | ICD-10-CM | POA: Diagnosis not present

## 2022-12-28 NOTE — Patient Outreach (Signed)
Care Coordination   Follow Up Visit Note   12/27/2022 Name: Steven [name] MRN: [id number] DOB: [birth year]  Steven [name] is a 72 y.o. year old male who sees Elfredia Nevins, MD for primary care. I spoke with  [name] by phone today.  What matters to the patients health and wellness today?  Managing heart conditions and having gallbladder examined    Goals Addressed             This Visit's Progress    Manage Blood Pressure & Heart Rate/Rhythm   On track    Care Coordination Goals: Patient will take medications as directed and report any negative side effects to provider  Patient will use a pill box/organizer to help keep up with when to take medications Patient will monitor and record blood pressure and heart rate twice and as needed and will call PCP with any readings outside of recommended range Patient will document any symptoms on blood pressure/heart rate log Patient will keep all recommended follow-up appointments with PCP and cardiologist Patient will keep all medical procedure appointments Patient will apply heart monitor that he received in the mail today.  Provided guidance on application. Encouraged to reach out to cardio office with any additional questions Patient will take blood pressure log to PCP and specialty appointments for review Patient will follow a low sodium/DASH diet  Patient will move carefully and change positions slowly to decrease risk of orthostatic hypotension and falls Patient will consider talking with LCSW re: health related anxiety Patient will reach out to RN Care Coordinator [phone] with any care coordination or resource needs          SDOH assessments and interventions completed:  Yes  SDOH Interventions Today    Flowsheet Row Most Recent Value  SDOH Interventions   Transportation Interventions Intervention Not Indicated  Financial Strain Interventions Other (Comment)  [referral to [facility] LCSW re: medical debt. Also  encouraged to reach out to [facility] DSS Case Worker re: current Medicaid coverage and to see if he qualifies for emergency medicaid based on medical debt]        Care Coordination Interventions:  Yes, provided  Interventions Today    Flowsheet Row Most Recent Value  Chronic Disease   Chronic disease during today's visit Hypertension (HTN), Atrial Fibrillation (AFib)  General Interventions   General Interventions Discussed/Reviewed General Interventions Discussed, General Interventions Reviewed, Durable Medical Equipment (DME), Doctor Visits, Labs, Communication with  Doctor Visits Discussed/Reviewed Doctor Visits Discussed, Doctor Visits Reviewed, PCP, Specialist  [follow-up with PCP re: blood pressure management. [facility]t [facility] for 12[facility]28/22 for "gallbladder test" (likely HIDA scan)]  Durable Medical Equipment (DME) BP Cuff  [heart monitor]  PCP/Specialist Visits Compliance with follow-up visit  Communication with --  [consulted by scheduling care guide because patient had some health related concerns while she was scheduling him a follow-up appt. Telephone call transferred to me.]  Education Interventions   Education Provided Provided Education  Provided Verbal Education On When to see the doctor, Mental Health/Coping with Illness, Medication  [heart monitor application, upcoming gallbaldder study, possible health related anxiety]  Mental Health Interventions   Mental Health Discussed/Reviewed Anxiety, Mental Health Discussed, Mental Health Reviewed  [consider LCSW referral]  Pharmacy Interventions   Pharmacy Dicussed/Reviewed Pharmacy Topics Discussed, Medications and their functions, Pharmacy Topics Reviewed  Safety Interventions   Safety Discussed/Reviewed Safety Discussed, Safety Reviewed       Follow up plan: Follow up call scheduled for 01/01/23  Encounter Outcome:  Pt. Visit Completed   Demetrios Loll, BSN, RN-BC RN Care Coordinator [facility]  [facility]  [facility] Direct Dial: [phone] Main #: [phone]

## 2022-12-29 DIAGNOSIS — E0590 Thyrotoxicosis, unspecified without thyrotoxic crisis or storm: Secondary | ICD-10-CM | POA: Diagnosis not present

## 2022-12-29 DIAGNOSIS — M7989 Other specified soft tissue disorders: Secondary | ICD-10-CM | POA: Diagnosis not present

## 2022-12-29 DIAGNOSIS — I4891 Unspecified atrial fibrillation: Secondary | ICD-10-CM | POA: Diagnosis not present

## 2022-12-29 DIAGNOSIS — G629 Polyneuropathy, unspecified: Secondary | ICD-10-CM | POA: Diagnosis not present

## 2022-12-29 DIAGNOSIS — I471 Supraventricular tachycardia, unspecified: Secondary | ICD-10-CM | POA: Diagnosis not present

## 2022-12-29 DIAGNOSIS — I10 Essential (primary) hypertension: Secondary | ICD-10-CM | POA: Diagnosis not present

## 2022-12-29 DIAGNOSIS — G43909 Migraine, unspecified, not intractable, without status migrainosus: Secondary | ICD-10-CM | POA: Diagnosis not present

## 2022-12-29 DIAGNOSIS — K589 Irritable bowel syndrome without diarrhea: Secondary | ICD-10-CM | POA: Diagnosis not present

## 2022-12-29 DIAGNOSIS — E876 Hypokalemia: Secondary | ICD-10-CM | POA: Diagnosis not present

## 2023-01-01 ENCOUNTER — Encounter: Payer: Self-pay | Admitting: *Deleted

## 2023-01-01 ENCOUNTER — Ambulatory Visit: Payer: Self-pay | Admitting: *Deleted

## 2023-01-02 ENCOUNTER — Telehealth: Payer: Self-pay | Admitting: Psychiatry

## 2023-01-02 ENCOUNTER — Telehealth: Payer: Self-pay | Admitting: Cardiovascular Disease

## 2023-01-02 NOTE — Telephone Encounter (Signed)
Called patient back about his message. Patient complaining of BP going up and down. Patient is symptomatic when standing and feels his BP drop and gets dizzy, lightheaded, and SOB. Patient then stated when he lays down at night his BP and HR is just high and racing. Encouraged patient to take his medications as prescribed. Patient has clonidine to take for SBP over 160. Patient stated he took 5 clonidine yesterday. Informed patient that he needs to give his medications time to work and if he BP drops too low to drink fluids to help bring it up. Spent 25 minutes on the phone with patient, trying to explain how his medications work. Patient stated he already called his PCP who told him something else. Patient has appointment with Dr. Ladona Ridgel in [place] on Thursday. Encouraged patient to keep his appointment and call our office if his medications do not work as prescribed. Will send to Dr. Ladona Ridgel for further advisement.

## 2023-01-02 NOTE — Telephone Encounter (Signed)
Pt c/o BP issue: STAT if pt c/o blurred vision, one-sided weakness or slurred speech  1. What are your last 5 BP readings?  100/65 80/58 111/65 172/87 177/89 129/76 200/139 (laying down 5:00am this morning)  Pt states his hr is staying between 57-60   2. Are you having any other symptoms (ex. Dizziness, headache, blurred vision, passed out)? Blurred vision (not currently), lightheadedness, weakness, slight chest pain a few times, some fluttering   3. What is your BP issue?  Pt called in concerned about his bp, please advise.

## 2023-01-02 NOTE — Telephone Encounter (Signed)
Called pt back. Reports he has been on gabapentin rx'd from other neurologist. Taking gabapentin 300mg  po TID. Reports arms/hands tingle, gets sharp/pinching pain anywhere in body. Feels nerve pain causing high BP.   Pt reports he has been on BP meds long term, prescribed by PCP. BP will elevate when he lays down. Has Afib. Recently hospitalized back in May 2024 [facility]). Had heart ablation 12/12/22. BP during night will be around 200's/100's.Has f/u w/ Cardiology this Thursday. Recommended he keep this appt and discuss with them. He states he was told they will not manage BP meds. I recommended he then call PCP. He states he already called them this am and waiting on a call back. If they feel he needs to f/u with neurology after speaking with them, to let us know. He verbalized understanding.

## 2023-01-02 NOTE — Telephone Encounter (Signed)
Pt stated he needs to speak with nurse about blood pressure running high. Pt said he think it's coming from nerve pain.

## 2023-01-03 DIAGNOSIS — H9071 Mixed conductive and sensorineural hearing loss, unilateral, right ear, with unrestricted hearing on the contralateral side: Secondary | ICD-10-CM | POA: Diagnosis not present

## 2023-01-04 ENCOUNTER — Ambulatory Visit: Payer: Medicare HMO | Attending: Internal Medicine | Admitting: Internal Medicine

## 2023-01-04 ENCOUNTER — Other Ambulatory Visit: Payer: Self-pay

## 2023-01-04 VITALS — BP 110/68 | HR 59 | Ht 67.0 in | Wt 178.6 lb

## 2023-01-04 DIAGNOSIS — I480 Paroxysmal atrial fibrillation: Secondary | ICD-10-CM

## 2023-01-04 NOTE — Progress Notes (Signed)
HPI Steven [name] returns today for followup of his SVT. He is a pleasant 72 yo man with multiple medical problems who had SVT at over 200/min. He underwent successful ablation of AVNRT and was non-inducible after his procedure. I saw him several weeks ago and he described recurrent symptoms that he thought might be due to SVT. He has just finished wearing a cardiac monitor which is pending. He has a lot of GI complaints.  Allergies  Allergen Reactions   Latex Rash    Possible reaction to latex gloves per patient   Meclizine     Other reaction(s): Abdominal Pain, Other     Current Outpatient Medications  Medication Sig Dispense Refill   acetaminophen (TYLENOL) 500 MG tablet Take 500 mg by mouth every 6 (six) hours as needed for moderate pain.     alfuzosin (UROXATRAL) 10 MG 24 hr tablet Take 1 tablet (10 mg total) by mouth daily with breakfast. 90 tablet 3   buPROPion (WELLBUTRIN XL) 150 MG 24 hr tablet Take 150 mg by mouth daily.     Cholecalciferol (VITAMIN D3) 125 MCG (5000 UT) CAPS Take 1 capsule by mouth daily.      cloNIDine (CATAPRES) 0.1 MG tablet Take 1 tablet (0.1 mg total) by mouth 2 (two) times daily as needed (SBP>160). (Patient taking differently: Take 0.1 mg by mouth 3 (three) times daily.) 60 tablet 11   ELIQUIS 5 MG TABS tablet TAKE 1 TABLET(5 MG) BY MOUTH TWICE DAILY (Patient taking differently: Take 5 mg by mouth 2 (two) times daily.) 60 tablet 5   furosemide (LASIX) 20 MG tablet Take 1 tablet (20 mg total) by mouth daily. Take with or after meal. 90 tablet 3   gabapentin (NEURONTIN) 300 MG capsule Take 300 mg by mouth 3 (three) times daily as needed (nerve pain).     Galcanezumab-gnlm (EMGALITY) 120 MG/ML SOAJ Inject 1 Pen into the skin every 30 (thirty) days. 1.12 mL 7   hydrALAZINE (APRESOLINE) 25 MG tablet Take 25 mg by mouth 3 (three) times daily.     hydrOXYzine (ATARAX) 10 MG tablet Take 1 tablet (10 mg total) by mouth 3 (three) times daily as needed for  anxiety. 30 tablet 0   losartan (COZAAR) 100 MG tablet Take 100 mg by mouth daily.     meloxicam (MOBIC) 7.5 MG tablet Take 7.5 mg by mouth 2 (two) times daily as needed.     methimazole (TAPAZOLE) 10 MG tablet Take 1 tablet (10 mg total) by mouth daily with breakfast. 30 tablet 2   metoprolol tartrate (LOPRESSOR) 25 MG tablet Take 1 tablet (25 mg total) by mouth 2 (two) times daily. TAKE EXTRA HALF TABLET  AS NEEDED 180 tablet 2   NURTEC 75 MG TBDP Take 75 mg by mouth daily as needed (pain).     ondansetron (ZOFRAN-ODT) 4 MG disintegrating tablet Take 1 tablet (4 mg total) by mouth every 8 (eight) hours as needed for nausea or vomiting. 20 tablet 1   polyethylene glycol (MIRALAX / GLYCOLAX) 17 g packet Take 17 g by mouth daily. 30 each 2   potassium chloride SA (KLOR-CON M) 20 MEQ tablet Take 1 tablet (20 mEq total) by mouth daily. 30 tablet 0   Probiotic Product (PROBIOTIC-10 PO) Take 1 capsule by mouth daily.     traZODone (DESYREL) 50 MG tablet Take 50-150 mg by mouth at bedtime.     Ubrogepant (UBRELVY) 100 MG TABS Take 1 tablet (100 mg total)  by mouth as needed (for headaches). May repeat a dose in 2 hours if needed. Max dose 2 pills in 24 hours 16 tablet 6   vitamin B-12 (CYANOCOBALAMIN) 100 MCG tablet Take 100 mcg by mouth daily.     No current facility-administered medications for this visit.     Past Medical History:  Diagnosis Date   Anxiety    Arthritis    Atrial fibrillation (HCC)    Atrial flutter (HCC)    BPH (benign prostatic hyperplasia)    Chest pain    Dizziness    Dyspnea    laying down occ   Fracture 08/17/2015   MULTIPLE RIB FRACTURES     FROM FALL    GERD (gastroesophageal reflux disease)    Hemorrhoids    History of kidney stones    noted on CT scan   History of radiation therapy 08/22/11-10/13/11   prostate   Hyperlipemia    Hypertension    Hyperthyroidism    IBS (irritable bowel syndrome)    Insomnia    Light headedness    Migraine    Numbness and  tingling in left arm    Numbness and tingling of both legs    Open fracture of left elbow 08/18/2015   Prostate cancer [facility])    prostate s/p radiation Mar 2013   Rib fractures 08/17/2015   Tinnitus     ROS:   All systems reviewed and negative except as noted in the HPI.   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 Normal appearing colon   COLONOSCOPY WITH PROPOFOL N/A 10/23/2019   Procedure: COLONOSCOPY WITH PROPOFOL;  Surgeon: Corbin Ade, MD; external and grade 2 internal hemorrhoids, abnormal rectal blood vessels consistent with radiation proctitis s/p APC therapy, otherwise normal exam.   EVALUATION UNDER ANESTHESIA WITH ANAL FISTULECTOMY N/A 01/14/2020   Procedure: ANORECTAL EXAM UNDER ANESTHESIA;  Surgeon: Andria Meuse, MD;  Location: [facility];  Service: General;  Laterality: N/A;   HOT HEMOSTASIS  10/23/2019   Procedure: HOT HEMOSTASIS (ARGON PLASMA COAGULATION/BICAP);  Surgeon: Corbin Ade, MD;  Location: [facility];  Service: Endoscopy;;  apc rectal proctitis     KIDNEY SURGERY  1982   kidney tube collapse repair   RADIOACTIVE SEED IMPLANT     Prostate   SVT ABLATION N/A 12/12/2022   Procedure: SVT ABLATION;  Surgeon: Marinus Maw, MD;  Location: [facility] INVASIVE CV LAB;  Service: Cardiovascular;  Laterality: N/A;     Family History  Problem Relation Age of Onset   Aneurysm Mother        aortic   Hypertension Mother    Headache Mother    Prostate cancer Father    Hypertension Father    Colon cancer Neg Hx    Colon polyps Neg Hx      Social History   Socioeconomic History   Marital status: Single    Spouse name: Not on file    Number of children: 2   Years of education: Not on file   Highest education level: 12th grade  Occupational History   Occupation: Custodian     Employer: [employer]  Tobacco Use   Smoking  status: Former    Packs/day: 1.00    Years: 20.00    Additional pack years: 0.00    Total pack years: 20.00    Types: Cigarettes    Quit date: 2020    Years since quitting: 4.4   Smokeless tobacco: Never   Tobacco comments:    Quit smoking x 2 years    07/2015   SOMETIIMES i USE VAPOR   Vaping Use   Vaping Use: Never used  Substance and Sexual Activity   Alcohol use: Never   Drug use: Never   Sexual activity: Not Currently  Other Topics Concern   Not on file  Social History Narrative   Lives with friend   Divorced   No caffeine   Social Determinants of Health   Financial Resource Strain: Medium Risk (01/01/2023)   Overall Financial Resource Strain (CARDIA)    Difficulty of Paying Living Expenses: Somewhat hard  Food Insecurity: No Food Insecurity (11/24/2022)   Hunger Vital Sign    Worried About Running Out of Food in the Last Year: Never true    Ran Out of Food in the Last Year: Never true  Transportation Needs: No Transportation Needs (01/01/2023)   PRAPARE - Administrator, Civil Service (Medical): No    Lack of Transportation (Non-Medical): No  Physical Activity: Inactive (12/14/2022)   Exercise Vital Sign    Days of Exercise per Week: 0 days    Minutes of Exercise per Session: 0 min  Stress: No Stress Concern Present (11/03/2020)   [employer] of Occupational Health - Occupational Stress Questionnaire    Feeling of Stress : Not at all  Social Connections: Socially Isolated (11/03/2020)   Social Connection and Isolation Panel [NHANES]    Frequency of Communication with Friends and Family: Twice a week    Frequency of Social Gatherings with Friends and Family: Once a week    Attends Religious Services: Never    Database administrator or Organizations:  No    Attends Banker Meetings: Never    Marital Status: Divorced  Catering manager Violence: Not At Risk (11/24/2022)   Humiliation, Afraid, Rape, and Kick questionnaire    Fear of Current or Ex-Partner: No    Emotionally Abused: No    Physically Abused: No    Sexually Abused: No     BP 110/68   Pulse (!) 59   Ht 5\' 7"  (1.702 m)   Wt 178 lb 9.6 oz (81 kg)   SpO2 97%   BMI 27.97 kg/m   Physical Exam:  Well appearing NAD HEENT: Unremarkable Neck:  No JVD, no thyromegally Lymphatics:  No adenopathy Back:  No CVA tenderness Lungs:  Clear HEART:  Regular rate rhythm, no murmurs, no rubs, no clicks Abd:  soft, positive bowel sounds, no organomegally, no rebound, no guarding Ext:  2 plus pulses, no edema, no cyanosis, no clubbing Skin:  No rashes no nodules Neuro:  CN II through XII intact, motor grossly intact  EKG - nsr  Assess/Plan: SVT - he is s/p ablation. We have not seen any documented recurrent SVT but we will be able to review his heart monitor in the coming days.  HTN - his bp is well controlled today. We will follow.  Sharlot Gowda Ezrah Panning,MD

## 2023-01-04 NOTE — Patient Instructions (Signed)
Medication Instructions:  Your physician recommends that you continue on your current medications as directed. Please refer to the Current Medication list given to you today.  *If you need a refill on your cardiac medications before your next appointment, please call your pharmacy*   Lab Work: NONE   If you have labs (blood work) drawn today and your tests are completely normal, you will receive your results only by: MyChart Message (if you have MyChart) OR A paper copy in the mail If you have any lab test that is abnormal or we need to change your treatment, we will call you to review the results.   Testing/Procedures: NONE    Follow-Up: At [facility], you and your health needs are our priority.  As part of our continuing mission to provide you with exceptional heart care, we have created designated Provider Care Teams.  These Care Teams include your primary Cardiologist (physician) and Advanced Practice Providers (APPs -  Physician Assistants and Nurse Practitioners) who all work together to provide you with the care you need, when you need it.  We recommend signing up for the patient portal called "MyChart".  Sign up information is provided on this After Visit Summary.  MyChart is used to connect with patients for Virtual Visits (Telemedicine).  Patients are able to view lab/test results, encounter notes, upcoming appointments, etc.  Non-urgent messages can be sent to your provider as well.   To learn more about what you can do with MyChart, go to [email or web address].    Your next appointment:   3 month(s)  Provider:   Gregg Taylor, MD    Other Instructions Thank you for choosing [facility]!    

## 2023-01-04 NOTE — Patient Outreach (Signed)
Care Coordination   Follow Up Visit Note   01/01/2023 Name: Steven [name] MRN: [id number] DOB: [birth year]  Steven [name] is a 72 y.o. year old male who sees Steven Nevins, MD for primary care. I spoke with  Steven [name] by phone today.  What matters to the patients health and wellness today?  Managing blood pressure and heart rate/rhythm    Goals Addressed             This Visit's Progress    Manage Blood Pressure & Heart Rate/Rhythm   On track    Care Coordination Goals: Patient will take medications as directed and report any negative side effects to provider  Patient will use a pill box/organizer to help keep up with when to take medications Patient will monitor and record blood pressure and heart rate twice and as needed and will call PCP with any readings outside of recommended range Reports that blood pressure is generally worse when lying down. Recent reading of 202/101 when supine.  Recommended that he prop up on pillows instead of laying more flat Patient will document any symptoms on blood pressure/heart rate log Patient will keep all recommended follow-up appointments with PCP and cardiologist Patient will keep all medical procedure appointments Follow-up with cardio Patient will return heart monitor via package provider Patient will take blood pressure log to PCP and specialty appointments for review Patient will follow a low sodium/DASH diet  Patient will move carefully and change positions slowly to decrease risk of orthostatic hypotension and falls Patient will consider talking with LCSW re: health related anxiety Patient will reach out to RN Care Coordinator [phone] with any care coordination or resource needs          SDOH assessments and interventions completed:  Yes  SDOH Interventions Today    Flowsheet Row Most Recent Value  SDOH Interventions   Transportation Interventions Intervention Not Indicated  Financial Strain Interventions Other  (Comment)  [referral to [facility] LCSW re: medical debt. Also encouraged to reach out to [facility] DSS Case Worker re: current Medicaid coverage and to see if he qualifies for emergency medicaid based on medical debt]        Care Coordination Interventions:  Yes, provided  Interventions Today    Flowsheet Row Most Recent Value  Chronic Disease   Chronic disease during today's visit Hypertension (HTN), Atrial Fibrillation (AFib)  General Interventions   General Interventions Discussed/Reviewed General Interventions Discussed, General Interventions Reviewed, Labs, Doctor Visits, Durable Medical Equipment (DME)  Labs Kidney Function  Doctor Visits Discussed/Reviewed Doctor Visits Discussed, Specialist, Doctor Visits Reviewed, PCP  Durable Medical Equipment (DME) BP Cuff  PCP/Specialist Visits Compliance with follow-up visit  [scheduled with Dr Ladona Ridgel, cardio, 01/04/23]  Education Interventions   Education Provided Provided Education  Provided Verbal Education On Mental Health/Coping with Illness, Nutrition, Labs, Medication, When to see the doctor, Exercise  [monitoring blood pressure]  Labs Reviewed Kidney Function, Lipid Profile  Nutrition Interventions   Nutrition Discussed/Reviewed Nutrition Discussed, Nutrition Reviewed, Fluid intake, Decreasing salt  Pharmacy Interventions   Pharmacy Dicussed/Reviewed Pharmacy Topics Discussed, Pharmacy Topics Reviewed, Medications and their functions  Safety Interventions   Safety Discussed/Reviewed Safety Discussed, Safety Reviewed, Home Safety, Fall Risk  Home Safety Assistive Devices       Follow up plan: Follow up call scheduled for 01/11/23    Encounter Outcome:  Pt. Visit Completed   Steven [name], BSN, RN-BC RN Care Coordinator [facility]  [facility] Direct Dial: [phone]  Main #: [phone]

## 2023-01-05 ENCOUNTER — Other Ambulatory Visit: Payer: Self-pay | Admitting: "Endocrinology

## 2023-01-05 DIAGNOSIS — I471 Supraventricular tachycardia, unspecified: Secondary | ICD-10-CM | POA: Diagnosis not present

## 2023-01-07 ENCOUNTER — Encounter (HOSPITAL_COMMUNITY): Payer: Self-pay | Admitting: Emergency Medicine

## 2023-01-07 ENCOUNTER — Other Ambulatory Visit: Payer: Self-pay

## 2023-01-07 ENCOUNTER — Emergency Department (HOSPITAL_COMMUNITY)
Admission: EM | Admit: 2023-01-07 | Discharge: 2023-01-08 | Disposition: A | Discharge status: Home / Self Care | Payer: Medicare HMO | Attending: Emergency Medicine | Admitting: Emergency Medicine

## 2023-01-07 ENCOUNTER — Emergency Department (HOSPITAL_COMMUNITY): Payer: Medicare HMO

## 2023-01-07 DIAGNOSIS — Z7901 Long term (current) use of anticoagulants: Secondary | ICD-10-CM | POA: Insufficient documentation

## 2023-01-07 DIAGNOSIS — R0602 Shortness of breath: Secondary | ICD-10-CM | POA: Insufficient documentation

## 2023-01-07 DIAGNOSIS — R0789 Other chest pain: Secondary | ICD-10-CM | POA: Diagnosis not present

## 2023-01-07 DIAGNOSIS — I10 Essential (primary) hypertension: Secondary | ICD-10-CM | POA: Insufficient documentation

## 2023-01-07 DIAGNOSIS — R072 Precordial pain: Secondary | ICD-10-CM | POA: Insufficient documentation

## 2023-01-07 DIAGNOSIS — Z91040 Latex allergy status: Secondary | ICD-10-CM | POA: Insufficient documentation

## 2023-01-07 DIAGNOSIS — R079 Chest pain, unspecified: Secondary | ICD-10-CM | POA: Diagnosis not present

## 2023-01-07 DIAGNOSIS — R002 Palpitations: Secondary | ICD-10-CM | POA: Insufficient documentation

## 2023-01-07 DIAGNOSIS — R110 Nausea: Secondary | ICD-10-CM | POA: Diagnosis not present

## 2023-01-07 DIAGNOSIS — Z79899 Other long term (current) drug therapy: Secondary | ICD-10-CM | POA: Diagnosis not present

## 2023-01-07 LAB — COMPREHENSIVE METABOLIC PANEL
ALT: 12 U/L (ref 0–44)
AST: 15 U/L (ref 15–41)
Albumin: 3.6 g/dL (ref 3.5–5.0)
Alkaline Phosphatase: 68 U/L (ref 38–126)
Anion gap: 7 (ref 5–15)
BUN: 8 mg/dL (ref 8–23)
CO2: 29 mmol/L (ref 22–32)
Calcium: 9.2 mg/dL (ref 8.9–10.3)
Chloride: 104 mmol/L (ref 98–111)
Creatinine, Ser: 0.85 mg/dL (ref 0.61–1.24)
GFR, Estimated: >60 mL/min (ref >60)
Glucose, Bld: 109 mg/dL — ABNORMAL HIGH (ref 70–99)
Potassium: 3.9 mmol/L (ref 3.5–5.1)
Sodium: 140 mmol/L (ref 135–145)
Total Bilirubin: 0.4 mg/dL (ref 0.3–1.2)
Total Protein: 6.8 g/dL (ref 6.5–8.1)

## 2023-01-07 LAB — CBC WITH DIFFERENTIAL/PLATELET
Abs Immature Granulocytes: 0.01 10*3/uL (ref 0.00–0.07)
Basophils Absolute: 0 10*3/uL (ref 0.0–0.1)
Basophils Relative: 1 %
Eosinophils Absolute: 0.1 10*3/uL (ref 0.0–0.5)
Eosinophils Relative: 3 %
HCT: 41.4 % (ref 39.0–52.0)
Hemoglobin: 13.3 g/dL (ref 13.0–17.0)
Immature Granulocytes: 0 %
Lymphocytes Relative: 39 %
Lymphs Abs: 1.3 10*3/uL (ref 0.7–4.0)
MCH: 29.2 pg (ref 26.0–34.0)
MCHC: 32.1 g/dL (ref 30.0–36.0)
MCV: 90.8 fL (ref 80.0–100.0)
Monocytes Absolute: 0.2 10*3/uL (ref 0.1–1.0)
Monocytes Relative: 6 %
Neutro Abs: 1.7 10*3/uL (ref 1.7–7.7)
Neutrophils Relative %: 51 %
Platelets: 197 10*3/uL (ref 150–400)
RBC: 4.56 MIL/uL (ref 4.22–5.81)
RDW: 12.7 % (ref 11.5–15.5)
WBC: 3.4 10*3/uL — ABNORMAL LOW (ref 4.0–10.5)
nRBC: 0 % (ref 0.0–0.2)

## 2023-01-07 LAB — URINALYSIS, ROUTINE W REFLEX MICROSCOPIC
Bilirubin Urine: NEGATIVE
Glucose, UA: NEGATIVE mg/dL
Hgb urine dipstick: NEGATIVE
Ketones, ur: NEGATIVE mg/dL
Leukocytes,Ua: NEGATIVE
Nitrite: NEGATIVE
Protein, ur: NEGATIVE mg/dL
Specific Gravity, Urine: 1.008 (ref 1.005–1.030)
pH: 7 (ref 5.0–8.0)

## 2023-01-07 LAB — TROPONIN I (HIGH SENSITIVITY)
Troponin I (High Sensitivity): 9 ng/L (ref <18)
Troponin I (High Sensitivity): 12 ng/L (ref <18)

## 2023-01-07 LAB — BRAIN NATRIURETIC PEPTIDE: B Natriuretic Peptide: 63 pg/mL (ref 0.0–100.0)

## 2023-01-07 MED ORDER — ACETAMINOPHEN 325 MG PO TABS
650.0000 mg | ORAL_TABLET | Freq: Once | ORAL | Status: AC
  Administered 2023-01-07: 650 mg via ORAL
  Filled 2023-01-07: qty 2

## 2023-01-07 MED ORDER — HYDRALAZINE HCL 20 MG/ML IJ SOLN
5.0000 mg | Freq: Once | INTRAMUSCULAR | Status: AC
  Administered 2023-01-07: 5 mg via INTRAVENOUS
  Filled 2023-01-07: qty 1

## 2023-01-07 MED ORDER — MORPHINE SULFATE (PF) 4 MG/ML IV SOLN
4.0000 mg | Freq: Once | INTRAVENOUS | Status: AC
  Administered 2023-01-07: 4 mg via INTRAVENOUS
  Filled 2023-01-07: qty 1

## 2023-01-07 MED ORDER — ONDANSETRON 4 MG PO TBDP
4.0000 mg | ORAL_TABLET | Freq: Once | ORAL | Status: AC
  Administered 2023-01-07: 4 mg via ORAL
  Filled 2023-01-07: qty 1

## 2023-01-07 MED ORDER — METOPROLOL TARTRATE 25 MG PO TABS
25.0000 mg | ORAL_TABLET | Freq: Once | ORAL | Status: AC
  Administered 2023-01-07: 25 mg via ORAL
  Filled 2023-01-07: qty 1

## 2023-01-07 NOTE — Discharge Instructions (Signed)
Record your blood pressure in the morning and in the evening every day.  Follow-up with your family doctor this week and let him know what your blood pressure readings have been

## 2023-01-07 NOTE — ED Triage Notes (Signed)
Pt c/o chest pain since 1700.

## 2023-01-07 NOTE — ED Provider Notes (Signed)
[place] EMERGENCY DEPARTMENT AT [facility] Provider Note   CSN: [id number] Arrival date & time: 01/07/23  1733     History  Chief Complaint  Patient presents with   Chest Pain    Steven [name] is a 72 y.o. male.  Patient had palpitations and chest discomfort today.  He has a history of SVT and hypertension  The history is provided by the patient and medical records. No language interpreter was used.  Chest Pain Pain location:  Substernal area Pain quality: aching   Pain radiates to:  Does not radiate Pain severity:  Mild Onset quality:  Gradual Timing:  Intermittent Progression:  Resolved Chronicity:  Recurrent Relieved by:  Nothing Worsened by:  Nothing Ineffective treatments:  None tried Associated symptoms: no abdominal pain, no back pain, no cough, no fatigue and no headache        Home Medications Prior to Admission medications   Medication Sig Start Date End Date Taking? Authorizing Provider  acetaminophen (TYLENOL) 500 MG tablet Take 500 mg by mouth every 6 (six) hours as needed for moderate pain.    [provider]  alfuzosin (UROXATRAL) 10 MG 24 hr tablet Take 1 tablet (10 mg total) by mouth daily with breakfast. 04/07/22   [name], Mardene Celeste, MD  buPROPion (WELLBUTRIN XL) 150 MG 24 hr tablet Take 150 mg by mouth daily.    Elfredia Nevins, MD  Cholecalciferol (VITAMIN D3) 125 MCG (5000 UT) CAPS Take 1 capsule by mouth daily.     [provider]  cloNIDine (CATAPRES) 0.1 MG tablet Take 1 tablet (0.1 mg total) by mouth 2 (two) times daily as needed (SBP>160). Patient taking differently: Take 0.1 mg by mouth 3 (three) times daily. 11/27/22   Sherryll Burger, Pratik D, DO  ELIQUIS 5 MG TABS tablet TAKE 1 TABLET(5 MG) BY MOUTH TWICE DAILY Patient taking differently: Take 5 mg by mouth 2 (two) times daily. 10/18/22   Wendall Stade, MD  furosemide (LASIX) 20 MG tablet Take 1 tablet (20 mg total) by mouth daily. Take with or after meal. 11/07/22    Marinus Maw, MD  gabapentin (NEURONTIN) 300 MG capsule Take 300 mg by mouth 3 (three) times daily as needed (nerve pain). 02/24/20   [provider]  Galcanezumab-gnlm (EMGALITY) 120 MG/ML SOAJ Inject 1 Pen into the skin every 30 (thirty) days. 09/25/22   Ocie Doyne, MD  hydrALAZINE (APRESOLINE) 25 MG tablet Take 25 mg by mouth 3 (three) times daily. 10/09/22   [provider]  hydrOXYzine (ATARAX) 10 MG tablet Take 1 tablet (10 mg total) by mouth 3 (three) times daily as needed for anxiety. 11/26/22   Sherryll Burger, Pratik D, DO  losartan (COZAAR) 100 MG tablet Take 100 mg by mouth daily.    [provider]  meloxicam (MOBIC) 7.5 MG tablet Take 7.5 mg by mouth 2 (two) times daily as needed. 12/20/22   [provider]  methimazole (TAPAZOLE) 10 MG tablet TAKE 1 TABLET(10 MG) BY MOUTH DAILY WITH BREAKFAST 01/05/23   Roma Kayser, MD  metoprolol tartrate (LOPRESSOR) 25 MG tablet Take 1 tablet (25 mg total) by mouth 2 (two) times daily. TAKE EXTRA HALF TABLET  AS NEEDED 12/22/22 01/21/23  Sharlene Dory, PA-C  NURTEC 75 MG TBDP Take 75 mg by mouth daily as needed (pain). 08/14/22   [provider]  ondansetron (ZOFRAN-ODT) 4 MG disintegrating tablet Take 1 tablet (4 mg total) by mouth every 8 (eight) hours as needed  for nausea or vomiting. 08/23/22   Vassie Loll, MD  polyethylene glycol (MIRALAX / GLYCOLAX) 17 g packet Take 17 g by mouth daily. 08/23/22   Vassie Loll, MD  potassium chloride SA (KLOR-CON M) 20 MEQ tablet Take 1 tablet (20 mEq total) by mouth daily. 12/04/22   Jacalyn Lefevre, MD  Probiotic Product (PROBIOTIC-10 PO) Take 1 capsule by mouth daily.    [provider]  traZODone (DESYREL) 50 MG tablet Take 50-150 mg by mouth at bedtime. 11/14/22   [provider]  Ubrogepant (UBRELVY) 100 MG TABS Take 1 tablet (100 mg total) by mouth as needed (for headaches). May repeat a dose in 2 hours if needed. Max dose 2 pills in 24 hours  09/25/22   Ocie Doyne, MD  vitamin B-12 (CYANOCOBALAMIN) 100 MCG tablet Take 100 mcg by mouth daily.    [provider]      Allergies    Latex and Meclizine    Review of Systems   Review of Systems  Constitutional:  Negative for appetite change and fatigue.  HENT:  Negative for congestion, ear discharge and sinus pressure.   Eyes:  Negative for discharge.  Respiratory:  Negative for cough.   Cardiovascular:  Positive for chest pain.  Gastrointestinal:  Negative for abdominal pain and diarrhea.  Genitourinary:  Negative for frequency and hematuria.  Musculoskeletal:  Negative for back pain.  Skin:  Negative for rash.  Neurological:  Negative for seizures and headaches.  Psychiatric/Behavioral:  Negative for hallucinations.     Physical Exam Updated Vital Signs BP (!) 171/78   Pulse 64   Temp (!) 97.3 F (36.3 C) (Oral)   Resp 17   Ht 5\' 7"  (1.702 m)   Wt 81 kg   SpO2 98%   BMI 27.97 kg/m  Physical Exam Vitals and nursing note reviewed.  Constitutional:      Appearance: He is well-developed.  HENT:     Head: Normocephalic.     Nose: Nose normal.  Eyes:     General: No scleral icterus.    Conjunctiva/sclera: Conjunctivae normal.  Neck:     Thyroid: No thyromegaly.  Cardiovascular:     Rate and Rhythm: Normal rate and regular rhythm.     Heart sounds: No murmur heard.    No friction rub. No gallop.  Pulmonary:     Breath sounds: No stridor. No wheezing or rales.  Chest:     Chest wall: No tenderness.  Abdominal:     General: There is no distension.     Tenderness: There is no abdominal tenderness. There is no rebound.  Musculoskeletal:        General: Normal range of motion.     Cervical back: Neck supple.  Lymphadenopathy:     Cervical: No cervical adenopathy.  Skin:    Findings: No erythema or rash.  Neurological:     Mental Status: He is alert and oriented to person, place, and time.     Motor: No abnormal muscle tone.     Coordination:  Coordination normal.  Psychiatric:        Behavior: Behavior normal.     ED Results / Procedures / Treatments   Labs (all labs ordered are listed, but only abnormal results are displayed) Labs Reviewed  CBC WITH DIFFERENTIAL/PLATELET - Abnormal; Notable for the following components:      Result Value   WBC 3.4 (*)    All other components within normal limits  COMPREHENSIVE METABOLIC PANEL -  Abnormal; Notable for the following components:   Glucose, Bld 109 (*)    All other components within normal limits  URINALYSIS, ROUTINE W REFLEX MICROSCOPIC - Abnormal; Notable for the following components:   Color, Urine STRAW (*)    All other components within normal limits  BRAIN NATRIURETIC PEPTIDE  TROPONIN I (HIGH SENSITIVITY)  TROPONIN I (HIGH SENSITIVITY)    EKG EKG Interpretation  Date/Time:  Sunday January 07 2023 19:49:59 EDT Ventricular Rate:  65 PR Interval:  161 QRS Duration: 89 QT Interval:  410 QTC Calculation: 427 R Axis:   21 Text Interpretation: Sinus rhythm Minimal ST elevation, anterior leads Confirmed by Bethann Berkshire [phone]) on 01/07/2023 10:50:59 PM  Radiology DG Chest Port 1 View  Result Date: 01/07/2023 CLINICAL DATA:  Shortness of breath. EXAM: PORTABLE CHEST 1 VIEW COMPARISON:  Chest radiograph dated 12/08/2022. FINDINGS: Left lung base atelectasis/scarring. There are lung is clear. No pleural effusion pneumothorax. Stable cardiac silhouette. No acute osseous pathology. IMPRESSION: Left lung base atelectasis/scarring. Electronically Signed   By: Elgie Collard M.D.   On: 01/07/2023 20:14    Procedures Procedures    Medications Ordered in ED Medications  morphine (PF) 4 MG/ML injection 4 mg (has no administration in time range)  acetaminophen (TYLENOL) tablet 650 mg (has no administration in time range)  metoprolol tartrate (LOPRESSOR) tablet 25 mg (has no administration in time range)  hydrALAZINE (APRESOLINE) injection 5 mg (5 mg Intravenous Given  01/07/23 2028)    ED Course/ Medical Decision Making/ A&P                             Medical Decision Making Amount and/or Complexity of Data Reviewed Labs: ordered. Radiology: ordered. ECG/medicine tests: ordered.  Risk OTC drugs. Prescription drug management.  Patient with poorly controlled high blood pressure.  He improved with hydralazine and will follow-up with his family doctor this week for blood pressure control        Final Clinical Impression(s) / ED Diagnoses Final diagnoses:  Atypical chest pain  Palpitations    Rx / DC Orders ED Discharge Orders     None         Bethann Berkshire, MD 01/10/23 1027

## 2023-01-07 NOTE — ED Notes (Signed)
Pt advised that he is discharged.  Advised to call family for ride home from hospital

## 2023-01-07 NOTE — ED Notes (Signed)
CP is gone after morphine.  Pt has pressure in head and "feeling like bugs crawling" which is what he gets when BP is up

## 2023-01-07 NOTE — ED Notes (Signed)
Pt arrives by ems for chest tightness, "pounding heart", sob.  He also states that he has rectal pain, slight headache and worries about having UTI and also reports sciatica pain.  Pt is hypertensive.  He reports taking his medication as prescribed

## 2023-01-07 NOTE — ED Notes (Signed)
Pt ride has arrived, brought pt wheelchair and d/c paperwork but pt becomes very upset stating that he is not ready to leave.  He feels concerned about having recently had morphine and continued BP elevated and some nausea after morphine.  Spoke with pt about ED process and pt seems to calm down, will give nausea medication per protocol.  Pt is on phone with family who are encouraging

## 2023-01-08 ENCOUNTER — Encounter: Payer: Self-pay | Admitting: *Deleted

## 2023-01-08 ENCOUNTER — Telehealth: Payer: Self-pay | Admitting: Internal Medicine

## 2023-01-08 ENCOUNTER — Other Ambulatory Visit: Payer: Self-pay

## 2023-01-08 ENCOUNTER — Ambulatory Visit: Payer: Self-pay | Admitting: *Deleted

## 2023-01-08 MED ORDER — METHIMAZOLE 10 MG PO TABS: ORAL_TABLET | ORAL | 0 refills | Status: DC

## 2023-01-08 NOTE — Telephone Encounter (Signed)
Steven Stade, MD  You; Steven [name], MD2 minutes ago (11:48 AM)   He has anxiety and is in ED all the time Monitor was benign can f/u with primary for BP Does not need to see me    Called patient back. Patient stated Dr. Eden Emms always tells him this. Canceled patient appointment with Dr. Eden Emms. Patient stated his PCP is out this week. Patient stated his BP goes up when he lays down and goes down when he sits up or stands. Made patient [name] appointment to see Steven An PA this Friday since he wants to see someone to follow-up after being in the ED and for his BP.

## 2023-01-08 NOTE — ED Notes (Signed)
Pt is feeling better, helped out in wheelchair.  Home with friend.

## 2023-01-08 NOTE — Telephone Encounter (Signed)
Called patient back about his message. Patient stated he went to the ED yesterday, because he all of a sudden got sick. Symptoms included mild chest pain, BP elevated (240/140), nauseated, and his throat felt like it was going to close up. Patient stated his heart was just pounding. Patient had a negative work-up in the ED and advised to follow-up outpatient. Patient's monitor is in his chart. Patient would like results of monitor. Patient stated he is still having some of these same symptoms. Will send message to Dr. Eden Emms and Dr. Ladona Ridgel for advisement (last saw Dr. Ladona Ridgel on 6/13). Patient on Dr. Fabio Bering schedule on 01/18/23 for follow-up.

## 2023-01-08 NOTE — Patient Outreach (Signed)
Care Coordination   Initial Visit Note   01/08/2023  Name: Steven [name] MRN: [id number] DOB: 1951-07-23  Steven [name] is a 72 y.o. year old male who sees Elfredia Nevins, MD for primary care. I spoke with [name] by phone today.  What matters to the patients health and wellness today?  Receive Counseling & Supportive Services for Symptoms of Anxiety.   Goals Addressed               This Visit's Progress     Receive Counseling & Supportive Services for Symptoms of Anxiety. (pt-stated)   On track     Care Coordination Interventions:  Interventions Today    Flowsheet Row Most Recent Value  Chronic Disease   Chronic disease during today's visit Hypertension (HTN), Atrial Fibrillation (AFib), Other  [Uncontrolled Blood Pressure, Financial Insecurities & Anxiety]  General Interventions   General Interventions Discussed/Reviewed General Interventions Discussed, Labs, Vaccines, Doctor Visits, Health Screening, Annual Foot Exam, General Interventions Reviewed, Annual Eye Exam, Durable Medical Equipment (DME), [employer], Level of Care, Communication with  [Encouraged]  Labs Kidney Function  [Encouraged]  Vaccines COVID-19, Flu, Pneumonia, RSV, Tetanus/Pertussis/Diphtheria, Shingles  [Encouraged]  Doctor Visits Discussed/Reviewed Doctor Visits Discussed, Specialist, Doctor Visits Reviewed, Annual Wellness Visits, PCP  [Encouraged]  Health Screening Bone Density, Colonoscopy, Prostate  [Encouraged]  Durable Medical Equipment (DME) BP Cuff, Wheelchair  [Encouraged]  Wheelchair Standard  [Encouraged]  PCP/Specialist Visits Compliance with follow-up visit  [Encouraged]  Communication with RN, PCP/Specialists  [Encouraged]  Level of Care Adult Daycare, Applications, Assisted Living, Personal Care Services  [Encouraged]  Applications Medicaid, Personal Care Services  [Encouraged]  Exercise Interventions   Exercise Discussed/Reviewed Exercise Discussed, Assistive device  use and maintanence, Exercise Reviewed, Physical Activity, Weight Managment  [Encouraged]  Physical Activity Discussed/Reviewed Physical Activity Discussed, Home Exercise Program (HEP), Physical Activity Reviewed, Types of exercise  [Encouraged]  Weight Management Weight loss  [Encouraged]  Education Interventions   Education Provided Provided Therapist, sports, Provided Web-based Education, Provided Education  [Encouraged]  Provided Verbal Education On Nutrition, Mental Health/Coping with Illness, When to see the doctor, Foot Care, Eye Care, Labs, Applications, Exercise, Medication, Development worker, community, [employer] Resources  [Encouraged]  Labs Reviewed Kidney Function, Lipid Profile  [Encouraged]  Applications Medicaid, Personal Care Services  [Encouraged]  Mental Health Interventions   Mental Health Discussed/Reviewed Mental Health Discussed, Anxiety, Depression, Mental Health Reviewed, Grief and Loss, Substance Abuse, Coping Strategies, Suicide, Crisis, Other  [Domestic Violence]  Nutrition Interventions   Nutrition Discussed/Reviewed Nutrition Discussed, Adding fruits and vegetables, Increasing proteins, Decreasing fats, Decreasing salt, Nutrition Reviewed, Fluid intake, Carbohydrate meal planning, Portion sizes, Decreasing sugar intake  [Encouraged]  Pharmacy Interventions   Pharmacy Dicussed/Reviewed Pharmacy Topics Discussed, Medications and their functions, Medication Adherence, Pharmacy Topics Reviewed, Affording Medications  [Encouraged]  Safety Interventions   Safety Discussed/Reviewed Safety Discussed, Safety Reviewed, Fall Risk, Home Safety  [Encouraged]  Home Safety Assistive Devices, Need for home safety assessment, Refer for community resources  [Encouraged]  Advanced Directive Interventions   Advanced Directives Discussed/Reviewed Advanced Directives Discussed  [Encouraged]     Assessed Social Determinant of Health Barriers. Discussed Plans for Ongoing Care Management Follow  Up. Provided Careers information officer Information for Care Management Team Members. Screened for Signs & Symptoms of Depression, Related to Chronic Disease State.  PHQ2 & PHQ9 Depression Screen Completed & Results Reviewed.  Suicidal Ideation & Homicidal Ideation Assessed - None Present.   Domestic Violence Assessed - None Present. Access to Weapons Assessed -  None Present.   Active Listening & Reflection Utilized.  Verbalization of Feelings Encouraged.  Emotional Support Provided. Feelings of Frustration Validated. Symptoms of Anxiety & Panic Attacks Acknowledged. Caregiver Resources Reviewed. Caregiver Support Groups Mailed. Crisis Support Information, Agencies, Services & Resources Discussed. Problem Solving Interventions Identified. Task-Centered Solutions Implemented.   Solution-Focused Strategies Developed. Acceptance & Commitment Therapy Introduced. Brief Cognitive Behavioral Therapy Initiated. Client-Centered Therapy Enacted. Reviewed Prescription Medications & Discussed Importance of Compliance. Quality of Sleep Assessed & Sleep Hygiene Techniques Promoted. Deep Breathing Exercises, Relaxation Techniques & Mindfulness Meditation Strategies Taught & Implementation Encouraged Daily. Participation in Counseling Encouraged. Discussed Referral to Psychiatrist for Psychotropic Medication Administration & Management. Discussed Referral to Therapist for Psychotherapeutic Counseling & Supportive Services. Emphasized Importance of Quitting Vaping & Offered Smoking Cessation Classes, Services, Agencies & Resources. Discussed Higher Level of Care Options (I.e. Assisted Living, Family Care, Senior Living, Etc.) & Encouraged Consideration. Verified No In-Home Care Services, [employer], Warden/ranger, Etc., Covered Under Firefighter through [employer].  Reviewed Materials engineer through [facility] & Encouraged Completion of Application for  Medicaid & Submission to [facility] of Social Services [phone]) for Processing. Verified No Long-Term Care Insurance Benefits, Secondary Insurance Policies, Plans, Coverage, Etc.  Confirmed Ineligibility to Apply for Aid & Attendance Benefits, Through [facility]. Requested Review of The Following List of [employer], [employer], Wellsite geologist, Emailed on 01/08/2023: ~ Adult Day Care Programs  ~ In-Home Care & Respite Agencies ~ Home Health Care Agencies ~ Respite Care Agencies & Facilities ~ Personal Care Services Application  ~ Development worker, international aid Agency Providers ~ 2023 Medicaid Tips ~ Medicaid Application ~ [employer] Financial Assistance Application  Encouraged Careers information officer with [employer], Services & Resources of Interest, in An Effort to Obtain Care & Supervision in The Home [employer]. Encouraged Completion of Application for Personal Care Services & Submission to [employer] [phone]) for Processing, Once Approved for Medicaid, through [facility] of Social Services [phone]). Control and instrumentation engineer with CSW (# [phone]), if You Have Questions, Need Assistance, or If Additional Social Work Needs Are Identified Between Now & Our Next Scheduled Telephone [employer].        SDOH assessments and interventions completed:  Yes.  SDOH Interventions Today    Flowsheet Row Most Recent Value  SDOH Interventions   Food Insecurity Interventions Intervention Not Indicated  Housing Interventions Intervention Not Indicated  Transportation Interventions Intervention Not Indicated, Patient Resources (Friends/Family), Payor Benefit  Utilities Interventions Intervention Not Indicated  Alcohol Usage Interventions Intervention Not Indicated (Score <7)  Financial Strain Interventions Artist, Other (Comment)  [Provided Air traffic controller for [employer], Medicaid, Supplemental Nutrition Assistance Program, Etc.]  Physical Activity Interventions Patient Declined  Stress Interventions Offered [employer], Provide Counseling, Other (Comment)  [Provided Counseling Agencies, Services & Resources]  Social Connections Interventions Intervention Not Indicated     Care Coordination Interventions:  Yes, provided.   Follow up plan: Follow up call scheduled for 01/22/2023 at 2:30 pm.  Encounter Outcome:  Pt. Visit Completed.   Danford Bad, BSW, MSW, LCSW  Licensed Restaurant manager, [facility] food [facility]  Mailing [place] [address] [place], [place] [address] Physical Address-[address] [place], [place] [address] Toll Free Main # [phone] Fax # [phone] Cell # [phone] [email or web address]

## 2023-01-08 NOTE — ED Provider Notes (Signed)
Called to patient's room for re-evaluation prior to discharge. He expresses continued concerns regarding his blood pressure. He was reassured that his ED workup was negative and no signs of end organ damage. He reports that his blood pressure is 'high every night'. I explained to him the role of the ED in evaluating hypertension and that with a negative ED workup he will need further management in the outpatient setting.    Pollyann Savoy, MD 01/08/23 [phone]

## 2023-01-08 NOTE — Telephone Encounter (Signed)
Pt states she was in the ED this weekend and his bp was really high. He states it was "240 over 140 something". He states his heart is still fluttering. He is nauseous and has a headache. Pt also concerned about his monitor results and wondering if they are in yet. Please advise.

## 2023-01-08 NOTE — ED Notes (Signed)
Pt is calmer now, his visitor is at the bedside.  Ice water given

## 2023-01-08 NOTE — Patient Instructions (Signed)
Visit Information  Thank you for taking time to visit with me today. Please don't hesitate to contact me if I can be of assistance to you.   Following are the goals we discussed today:   Goals Addressed               This Visit's Progress     Receive Counseling & Supportive Services for Symptoms of Anxiety. (pt-stated)   On track     Care Coordination Interventions:  Interventions Today    Flowsheet Row Most Recent Value  Chronic Disease   Chronic disease during today's visit Hypertension (HTN), Atrial Fibrillation (AFib), Other  [Uncontrolled Blood Pressure, Financial Insecurities & Anxiety]  General Interventions   General Interventions Discussed/Reviewed General Interventions Discussed, Labs, Vaccines, Doctor Visits, Health Screening, Annual Foot Exam, General Interventions Reviewed, Annual Eye Exam, Durable Medical Equipment (DME), [employer], Level of Care, Communication with  [Encouraged]  Labs Kidney Function  [Encouraged]  Vaccines COVID-19, Flu, Pneumonia, RSV, Tetanus/Pertussis/Diphtheria, Shingles  [Encouraged]  Doctor Visits Discussed/Reviewed Doctor Visits Discussed, Specialist, Doctor Visits Reviewed, Annual Wellness Visits, PCP  [Encouraged]  Health Screening Bone Density, Colonoscopy, Prostate  [Encouraged]  Durable Medical Equipment (DME) BP Cuff, Wheelchair  [Encouraged]  Wheelchair Standard  [Encouraged]  PCP/Specialist Visits Compliance with follow-up visit  [Encouraged]  Communication with RN, PCP/Specialists  [Encouraged]  Level of Care Adult Daycare, Applications, Assisted Living, Personal Care Services  [Encouraged]  Applications Medicaid, Personal Care Services  [Encouraged]  Exercise Interventions   Exercise Discussed/Reviewed Exercise Discussed, Assistive device use and maintanence, Exercise Reviewed, Physical Activity, Weight Managment  [Encouraged]  Physical Activity Discussed/Reviewed Physical Activity Discussed, Home Exercise Program (HEP),  Physical Activity Reviewed, Types of exercise  [Encouraged]  Weight Management Weight loss  [Encouraged]  Education Interventions   Education Provided Provided Therapist, sports, Provided Web-based Education, Provided Education  [Encouraged]  Provided Verbal Education On Nutrition, Mental Health/Coping with Illness, When to see the doctor, Foot Care, Eye Care, Labs, Applications, Exercise, Medication, Development worker, community, [employer] Resources  [Encouraged]  Labs Reviewed Kidney Function, Lipid Profile  [Encouraged]  Applications Medicaid, Personal Care Services  [Encouraged]  Mental Health Interventions   Mental Health Discussed/Reviewed Mental Health Discussed, Anxiety, Depression, Mental Health Reviewed, Grief and Loss, Substance Abuse, Coping Strategies, Suicide, Crisis, Other  [Domestic Violence]  Nutrition Interventions   Nutrition Discussed/Reviewed Nutrition Discussed, Adding fruits and vegetables, Increasing proteins, Decreasing fats, Decreasing salt, Nutrition Reviewed, Fluid intake, Carbohydrate meal planning, Portion sizes, Decreasing sugar intake  [Encouraged]  Pharmacy Interventions   Pharmacy Dicussed/Reviewed Pharmacy Topics Discussed, Medications and their functions, Medication Adherence, Pharmacy Topics Reviewed, Affording Medications  [Encouraged]  Safety Interventions   Safety Discussed/Reviewed Safety Discussed, Safety Reviewed, Fall Risk, Home Safety  [Encouraged]  Home Safety Assistive Devices, Need for home safety assessment, Refer for community resources  [Encouraged]  Advanced Directive Interventions   Advanced Directives Discussed/Reviewed Advanced Directives Discussed  [Encouraged]     Assessed Social Determinant of Health Barriers. Discussed Plans for Ongoing Care Management Follow Up. Provided Careers information officer Information for Care Management Team Members. Screened for Signs & Symptoms of Depression, Related to Chronic Disease State.  PHQ2 & PHQ9 Depression Screen  Completed & Results Reviewed.  Suicidal Ideation & Homicidal Ideation Assessed - None Present.   Domestic Violence Assessed - None Present. Access to Weapons Assessed - None Present.   Active Listening & Reflection Utilized.  Verbalization of Feelings Encouraged.  Emotional Support Provided. Feelings of Frustration Validated. Symptoms of Anxiety & Panic Attacks Acknowledged. Caregiver Resources  Reviewed. Caregiver Support Groups Mailed. Crisis Support Information, Agencies, Services & Resources Discussed. Problem Solving Interventions Identified. Task-Centered Solutions Implemented.   Solution-Focused Strategies Developed. Acceptance & Commitment Therapy Introduced. Brief Cognitive Behavioral Therapy Initiated. Client-Centered Therapy Enacted. Reviewed Prescription Medications & Discussed Importance of Compliance. Quality of Sleep Assessed & Sleep Hygiene Techniques Promoted. Deep Breathing Exercises, Relaxation Techniques & Mindfulness Meditation Strategies Taught & Implementation Encouraged Daily. Participation in Counseling Encouraged. Discussed Referral to Psychiatrist for Psychotropic Medication Administration & Management. Discussed Referral to Therapist for Psychotherapeutic Counseling & Supportive Services. Emphasized Importance of Quitting Vaping & Offered Smoking Cessation Classes, Services, Agencies & Resources. Discussed Higher Level of Care Options (I.e. Assisted Living, Family Care, Senior Living, Etc.) & Encouraged Consideration. Verified No In-Home Care Services, [employer], Warden/ranger, Etc., Covered Under Firefighter through [employer].  Reviewed Materials engineer through [facility] & Encouraged Completion of Application for Medicaid & Submission to [facility] of Social Services [phone]) for Processing. Verified No Long-Term Care Insurance Benefits, Secondary Insurance Policies,  Plans, Coverage, Etc.  Confirmed Ineligibility to Apply for Aid & Attendance Benefits, Through [facility]. Requested Review of The Following List of [employer], [employer], Wellsite geologist, Emailed on 01/08/2023: ~ Adult Day Care Programs  ~ In-Home Care & Respite Agencies ~ Home Health Care Agencies ~ Respite Care Agencies & Facilities ~ Personal Care Services Application  ~ Development worker, international aid Agency Providers ~ 2023 Medicaid Tips ~ Medicaid Application ~ [employer] Financial Assistance Application  Encouraged Careers information officer with [employer], Services & Resources of Interest, in An Effort to Obtain Care & Supervision in The Home [employer]. Encouraged Completion of Application for Personal Care Services & Submission to [facility] [phone]) for Processing, Once Approved for Medicaid, through [facility] of Social Services [phone]). Control and instrumentation engineer with CSW (# [phone]), if You Have Questions, Need Assistance, or If Additional Social Work Needs Are Identified Between Now & Our Next Scheduled Telephone [employer].      Our next appointment is by telephone on 01/22/2023 at 2:30 pm.  Please call the care guide team at [phone] if you need to cancel or reschedule your appointment.   If you are experiencing a Mental Health or Behavioral Health Crisis or need someone to talk to, please call the Suicide and Crisis Lifeline: 988 call the [place] National Suicide Prevention Lifeline: 785-404-3890 or TTY: 719 381 8535 TTY 763-076-1235) to talk to a trained counselor call 1-800-273-TALK (toll free, 24 hour hotline) go to [facility] [address], [place] [phone]) call the [facility] Crisis Line: [phone] call 911  Patient verbalizes understanding of instructions and care plan provided today and agrees to view in MyChart. Active  MyChart status and patient understanding of how to access instructions and care plan via MyChart confirmed with patient.     Telephone follow up appointment with care management team member scheduled for:  01/22/2023 at 2:30 pm.  Danford Bad, BSW, MSW, LCSW  Licensed Clinical Social Worker  [employer] [facility] [facility]  Mailing [place] [place], [place] [address] Physical Address-[address], [place], [place] [address] Toll Free Main # [phone] Fax # [phone] Cell # [phone] [email or web address]

## 2023-01-09 ENCOUNTER — Telehealth: Payer: Self-pay | Admitting: Internal Medicine

## 2023-01-09 DIAGNOSIS — I10 Essential (primary) hypertension: Secondary | ICD-10-CM | POA: Diagnosis not present

## 2023-01-09 DIAGNOSIS — Z6828 Body mass index (BMI) 28.0-28.9, adult: Secondary | ICD-10-CM | POA: Diagnosis not present

## 2023-01-09 DIAGNOSIS — E663 Overweight: Secondary | ICD-10-CM | POA: Diagnosis not present

## 2023-01-09 NOTE — Telephone Encounter (Signed)
I saw him last week. His heart monitor had not been returned. Will review when available.

## 2023-01-09 NOTE — Telephone Encounter (Signed)
Wants to be reinstated at office unsure of why pt has been dismissed.

## 2023-01-10 NOTE — Telephone Encounter (Signed)
Can you tell why he was dismissed from our practice?

## 2023-01-10 NOTE — Telephone Encounter (Signed)
Upon research, there is no appropriate reason for the dismissal of patient.  Discharge is removed from chart and can establish care with any provider accepting NP.

## 2023-01-11 ENCOUNTER — Ambulatory Visit: Payer: Self-pay | Admitting: *Deleted

## 2023-01-11 ENCOUNTER — Encounter: Payer: Self-pay | Admitting: *Deleted

## 2023-01-11 NOTE — Telephone Encounter (Signed)
Appointment scheduled.

## 2023-01-12 ENCOUNTER — Other Ambulatory Visit: Payer: Self-pay

## 2023-01-12 ENCOUNTER — Encounter: Payer: Self-pay | Admitting: Student

## 2023-01-12 ENCOUNTER — Ambulatory Visit: Payer: Medicare HMO | Attending: Student | Admitting: Student

## 2023-01-12 VITALS — BP 128/80 | HR 61 | Ht 67.0 in | Wt 179.2 lb

## 2023-01-12 DIAGNOSIS — I10 Essential (primary) hypertension: Secondary | ICD-10-CM | POA: Diagnosis not present

## 2023-01-12 DIAGNOSIS — I471 Supraventricular tachycardia, unspecified: Secondary | ICD-10-CM | POA: Diagnosis not present

## 2023-01-12 DIAGNOSIS — R079 Chest pain, unspecified: Secondary | ICD-10-CM | POA: Diagnosis not present

## 2023-01-12 DIAGNOSIS — I480 Paroxysmal atrial fibrillation: Secondary | ICD-10-CM | POA: Diagnosis not present

## 2023-01-12 DIAGNOSIS — E0590 Thyrotoxicosis, unspecified without thyrotoxic crisis or storm: Secondary | ICD-10-CM | POA: Diagnosis not present

## 2023-01-12 MED ORDER — AMLODIPINE BESYLATE 5 MG PO TABS: 5.0000 mg | ORAL_TABLET | Freq: Every day | ORAL | 3 refills | Status: DC

## 2023-01-12 NOTE — Patient Instructions (Signed)
Medication Instructions:  Your physician has recommended you make the following change in your medication:   -Stop Hydralazine  -Start Amlodipine 5 mg tablet once daily.   *If you need a refill on your cardiac medications before your next appointment, please call your pharmacy*   Lab Work: None If you have labs (blood work) drawn today and your tests are completely normal, you will receive your results only by: MyChart Message (if you have MyChart) OR A paper copy in the mail If you have any lab test that is abnormal or we need to change your treatment, we will call you to review the results.   Testing/Procedures: None   Follow-Up: At [facility], you and your health needs are our priority.  As part of our continuing mission to provide you with exceptional heart care, we have created designated Provider Care Teams.  These Care Teams include your primary Cardiologist (physician) and Advanced Practice Providers (APPs -  Physician Assistants and Nurse Practitioners) who all work together to provide you with the care you need, when you need it.  We recommend signing up for the patient portal called "MyChart".  Sign up information is provided on this After Visit Summary.  MyChart is used to connect with patients for Virtual Visits (Telemedicine).  Patients are able to view lab/test results, encounter notes, upcoming appointments, etc.  Non-urgent messages can be sent to your provider as well.   To learn more about what you can do with MyChart, go to [email or web address].    Your next appointment:   3-4 month(s)  Provider:   Charlton Haws, MD    Other Instructions Your physician has requested that you regularly monitor and record your blood pressure readings at home. Please use the same machine at the same time of day to check your readings and record them to bring to your follow-up visit.

## 2023-01-12 NOTE — Progress Notes (Signed)
Cardiology Office Note    Date:  01/12/2023  ID:  Steven [name], DOB [birth year], MRN [id number] Cardiologist: Charlton Haws, MD   EP: Dr. Ladona Ridgel  History of Present Illness:    Steven [name] is a 72 y.o. male with past medical history of paroxysmal atrial fibrillation/flutter, SVT, hyperthyroidism, HTN, HLD and IBS who presents to the office today for Emergency Department follow-up.  He was examined by Jari Favre, PA-C in 11/2022 following his recent ablation and reported that he did not feel better after the procedure and was having headaches and his blood pressure had been significantly variable, at 200/100 when checked at home. He was continued on Lopressor 25 mg twice daily and was advised to take an extra 12.5 if needed for recurrent SVT. A follow-up monitor was arranged to assess for any recurrent arrhythmias. He did see Dr. Ladona Ridgel on 01/04/2023 and was mostly reporting GI issues at that time. His monitor had not yet resulted but this has returned in the interim and showed predominantly normal sinus rhythm with an average heart rate of 58 bpm and rare PAC's and PVC's but less than 1% burden.  He presented back to the ED on 01/07/2023 for evaluation of palpitations and chest discomfort.  BNP was normal at 63 and Hs Troponin values were negative at 9 and 12. He was in normal sinus rhythm but BP was elevated at 171/78. It appears he received Hydralazine and was discharged home.  In talking with the patient today, he reports episodes of "getting sick" during which he feels sick on his stomach and can have diarrhea but then has a headache and nerve pains throughout his upper extremities. Reports he does have some discomfort along his epigastric region and sternal area when this occurs but says symptoms occur at rest and no association with exertion. He denies any specific dyspnea on exertion but says he does have episodes of feeling like his throat is closing shut which occur with the episodes of  diarrhea as discussed above. He does plan to arrange for follow-up with GI at [facility]. No specific orthopnea, PND or pitting edema. Still having some palpitations which he describes as a "flipping" sensation and we reviewed this likely correlates with his PAC's and PVC's by recent monitor.   Studies Reviewed:   EKG: EKG is not ordered today. EKG from 01/07/2023 is reviewed and demonstrates NSR, HR 65 with isolated TWI along Lead III which is similar to prior tracings.   Echocardiogram: 07/2022 IMPRESSIONS     1. Left ventricular ejection fraction, by estimation, is 60 to 65%. The  left ventricle has normal function. The left ventricle has no regional  wall motion abnormalities. Left ventricular diastolic parameters are  consistent with Grade I diastolic  dysfunction (impaired relaxation).   2. Right ventricular systolic function is normal. The right ventricular  size is normal. Tricuspid regurgitation signal is inadequate for assessing  PA pressure.   3. Left atrial size was mildly dilated.   4. The mitral valve is normal in structure. No evidence of mitral valve  regurgitation. No evidence of mitral stenosis.   5. The aortic valve is tricuspid. There is moderate calcification of the  aortic valve. Aortic valve regurgitation is not visualized. No aortic  stenosis is present.   6. The inferior vena cava is normal in size with greater than 50%  respiratory variability, suggesting right atrial pressure of 3 mmHg.   Comparison(s): No significant change from prior study.   NST:  08/2022   Stress ECG is negative for ischemia.   LV perfusion is normal. There is no evidence of ischemia. There is no evidence of infarction.   Left ventricular function is normal. Nuclear stress EF: 78 %.   The study is normal. The study is low risk.  SVT Ablation: 11/2022 CONCLUSIONS:  1. Sinus rhythm upon presentation.  2. The patient had dual AV nodal physiology with easily inducible classic AV  nodal reentrant tachycardia, there were no other accessory pathways or arrhythmias induced  3. Successful radiofrequency modification of the slow AV nodal pathway  4. No inducible arrhythmias following ablation.  5. No early apparent complications.   Zio Patch: 12/2022 Patch Wear Time:  4 days and 3 hours (2024-06-05T16:37:45-0400 to 2024-06-09T19:39:03-399)   Patient had a min HR of 43 bpm, max HR of 123 bpm, and avg HR of 58 bpm. Predominant underlying rhythm was Sinus Rhythm. Isolated SVEs were rare (<1.0%), SVE Couplets were rare (<1.0%), and SVE Triplets were rare (<1.0%). Isolated VEs were rare (<1.0%),  and no VE Couplets or VE Triplets were present. Ventricular Trigeminy was present.    Physical Exam:   VS:  BP 128/80   Pulse 61   Ht 5\' 7"  (1.702 m)   Wt 179 lb 3.2 oz (81.3 kg)   SpO2 98%   BMI 28.07 kg/m    Wt Readings from Last 3 Encounters:  01/12/23 179 lb 3.2 oz (81.3 kg)  01/07/23 178 lb 9.2 oz (81 kg)  01/04/23 178 lb 9.6 oz (81 kg)     GEN: Well nourished, well developed male appearing in no acute distress NECK: No JVD; No carotid bruits CARDIAC: RRR, no murmurs, rubs, gallops RESPIRATORY:  Clear to auscultation without rales, wheezing or rhonchi  ABDOMEN: Appears non-distended. No obvious abdominal masses. EXTREMITIES: No clubbing or cyanosis. No pitting edema.  Distal pedal pulses are 2+ bilaterally.   Assessment and Plan:   1. Paroxysmal Atrial Fibrillation/Flutter - He was maintaining normal sinus rhythm by recent event monitor and is in NSR by examination today. Remains on Lopressor 25 mg twice daily for rate-control and he is on Eliquis 5 mg twice daily for anticoagulation. Recent CBC earlier this month showed his hemoglobin was stable at 13.3 with platelets at 197 K.  2. SVT - He did meet with Dr. Ladona Ridgel in 10/2022 and underwent successful SVT ablation in 11/2022 with no immediate complications noted. Follow-up monitor earlier this month showed  predominantly normal sinus rhythm with no episodes of SVT. - Reviewed monitor results in detail with the patient today and by the description of his symptoms, I suspect he is feeling his PVC's and reviewed that this was less than 1% burden by his monitor which is benign. Continue Lopressor 25 mg twice daily. Would not further titrate given his heart rate in the low-60's. He is aware he can take an extra half-tablet if needed.  3. HTN - His blood pressure is well-controlled at 120/80 during today's visit but he reports this has been variable when checked at home and he has been utilizing Clonidine several times a day at least a few days each week. He feels like Hydralazine is causing headaches, therefore we will stop this and switch to Amlodipine 5 mg daily. He will keep a BP log and if readings remain elevated, would titrate to 10 mg daily. Continue Losartan 100 mg daily and Lopressor 25 mg twice daily.  Reviewed with the patient we would prefer to have stable control over his  blood pressure as compared to using PRN Clonidine since this can cause rebound HTN.   4. History of Chest Pain - He is s/p multiple stress tests with the most recent being in 08/2022 which showed no evidence of ischemia and was a low-risk study. This was previously reviewed with Dr. Eden Emms and if he had recurrent pain, it was recommended to consider a cardiac catheterization with Dr. Lynnette Caffey to do a vasoactive study to assess for small vessel disease once his thyroid function normalized. - At this time, his episodes of pain seem very atypical for a cardiac etiology and he has multiple GI issues. We reviewed that since his thyroid has normalized, we could consider a Coronary CT or cardiac catheterization as previously discussed but he wishes to hold off on this for now which I think is very reasonable given his atypical symptoms.   Signed, Ellsworth Lennox, PA-C

## 2023-01-13 LAB — T3, FREE: T3, Free: 3.5 pg/mL (ref 2.0–4.4)

## 2023-01-13 LAB — T4, FREE: Free T4: 0.97 ng/dL (ref 0.82–1.77)

## 2023-01-13 LAB — TSH: TSH: 8.49 u[IU]/mL — ABNORMAL HIGH (ref 0.450–4.500)

## 2023-01-15 ENCOUNTER — Telehealth: Payer: Self-pay | Admitting: Psychiatry

## 2023-01-15 ENCOUNTER — Telehealth: Payer: Self-pay

## 2023-01-15 NOTE — Telephone Encounter (Signed)
Phone room: please call back. Not sure why he got that call? He may want to follow up with the pharmacy for clarification.

## 2023-01-15 NOTE — Telephone Encounter (Signed)
Noted  

## 2023-01-15 NOTE — Telephone Encounter (Signed)
Phone room: I reviewed pt chart. I see documentation from PA team that there is approval on file for Emgality. He should be able to pick this up from pharmacy. I called [facility] at [phone]. Spoke w/ tech. He last refilled Emgality 01/03/23. If he is trying to fill now, its too soon.  Prior Authorization for [employer] 120MG /ML auto-injectors (migraine) has been approved by [facility] (ins).    PA #  [id number]. Effective dates: 10/19/2022 through 07/24/2023.

## 2023-01-15 NOTE — Telephone Encounter (Signed)
Pt is asking for a call to discuss how he has told he would only be able to remain on Aimovig temporally and that Emgality is not formulary thru [facility], pt is asking for a call to discuss his options.

## 2023-01-15 NOTE — Telephone Encounter (Signed)
Pt was called and informed. Pt states that he is not needing a refill he was just wondering why he received a call informing him that he no longer would be able to fill the Rx due to it only being temporary.

## 2023-01-15 NOTE — Telephone Encounter (Signed)
Pt called stating he had his labs drawn, requested results. Pt has an appt tomorrow at 2:30.

## 2023-01-15 NOTE — Patient Outreach (Signed)
  Care Coordination   Follow Up Visit Note   01/11/2023 Name: Steven [name] MRN: [id number] DOB: [birth year]  Steven [name] is a 72 y.o. year old male who sees Steven Nevins, MD for primary care. I spoke with  Steven [name] by phone today.  What matters to the patients health and wellness today?   Manage blood pressure and heart reate    Goals Addressed             This Visit's Progress    Manage Blood Pressure & Heart Rate/Rhythm       Care Coordination Goals: Patient will take medications as directed and report any negative side effects to provider  Patient will monitor and record blood pressure and heart rate twice and as needed and will call PCP with any readings outside of recommended range Reports that blood pressure is generally worse when lying down. Recent reading of 202/101 when supine. Cardiologist is aware. Recommended that he prop up on pillows instead of laying more flat Patient will document any symptoms on blood pressure/heart rate log Patient will keep all recommended follow-up appointments with PCP and cardiologist Patient will keep all medical procedure appointments Follow-up with cardio on 01/12/23 Patient will take blood pressure log to PCP and specialty appointments for review Patient will follow a low sodium/DASH diet  Patient will move carefully and change positions slowly to decrease risk of orthostatic hypotension and falls Patient will consider talking with LCSW re: health related anxiety Patient will reach out to RN Care Coordinator [phone] with any care coordination or resource needs          SDOH assessments and interventions completed:  Yes    SDOH Interventions Today    Flowsheet Row Most Recent Value  SDOH Interventions   Housing Interventions Intervention Not Indicated  Transportation Interventions Patient Resources (Friends/Family)        Care Coordination Interventions:  Yes, provided  Interventions Today    Flowsheet  Row Most Recent Value  Chronic Disease   Chronic disease during today's visit Hypertension (HTN), Atrial Fibrillation (AFib)  General Interventions   General Interventions Discussed/Reviewed General Interventions Discussed, General Interventions Reviewed, Doctor Visits, Durable Medical Equipment (DME)  Doctor Visits Discussed/Reviewed Doctor Visits Discussed, Specialist, Doctor Visits Reviewed, PCP  [cardio on 01/12/23]  Durable Medical Equipment (DME) BP Cuff  PCP/Specialist Visits Compliance with follow-up visit  Exercise Interventions   Exercise Discussed/Reviewed Physical Activity  Physical Activity Discussed/Reviewed Physical Activity Discussed, Physical Activity Reviewed  Education Interventions   Education Provided Provided Education  Provided Verbal Education On Mental Health/Coping with Illness, Medication, When to see the doctor, Other  [blood pressure management]  Nutrition Interventions   Nutrition Discussed/Reviewed Nutrition Discussed, Nutrition Reviewed, Decreasing salt, Portion sizes, Fluid intake  Pharmacy Interventions   Pharmacy Dicussed/Reviewed Pharmacy Topics Discussed, Pharmacy Topics Reviewed, Medications and their functions  Safety Interventions   Safety Discussed/Reviewed Safety Discussed, Safety Reviewed       Follow up plan: Follow up call scheduled for 02/02/23    Encounter Outcome:  Pt. Visit Completed   Demetrios Loll, BSN, RN-BC RN Care Coordinator [facility]  [facility] Direct Dial: [phone] Main #: [phone]

## 2023-01-16 ENCOUNTER — Telehealth: Payer: Self-pay | Admitting: "Endocrinology

## 2023-01-16 ENCOUNTER — Ambulatory Visit (INDEPENDENT_AMBULATORY_CARE_PROVIDER_SITE_OTHER): Payer: Medicare HMO | Admitting: "Endocrinology

## 2023-01-16 ENCOUNTER — Encounter: Payer: Self-pay | Admitting: "Endocrinology

## 2023-01-16 VITALS — BP 132/76 | HR 60 | Ht 67.0 in | Wt 178.0 lb

## 2023-01-16 DIAGNOSIS — E0590 Thyrotoxicosis, unspecified without thyrotoxic crisis or storm: Secondary | ICD-10-CM

## 2023-01-16 MED ORDER — METHIMAZOLE 5 MG PO TABS
5.0000 mg | ORAL_TABLET | Freq: Every day | ORAL | 0 refills | Status: DC
Start: 2023-01-16 — End: 2023-03-14

## 2023-01-16 NOTE — Telephone Encounter (Signed)
Pt said that you told him his thyroid was overactive. He is now asking what can he eat?   Should we refer him to [facility]?

## 2023-01-16 NOTE — Progress Notes (Signed)
01/16/2023     Endocrinology follow-up note   Subjective:    Patient ID: Steven [name], male    DOB: 11-15-1950, PCP Elfredia Nevins, MD.   Past Medical History:  Diagnosis Date   Anxiety    Arthritis    Atrial fibrillation [facility])    Atrial flutter (HCC)    BPH (benign prostatic hyperplasia)    Chest pain    Dizziness    Dyspnea    laying down occ   Fracture 08/17/2015   MULTIPLE RIB FRACTURES     FROM FALL    GERD (gastroesophageal reflux disease)    Hemorrhoids    History of kidney stones    noted on CT scan   History of radiation therapy 08/22/11-10/13/11   prostate   Hyperlipemia    Hypertension    Hyperthyroidism    IBS (irritable bowel syndrome)    Insomnia    Light headedness    Migraine    Numbness and tingling in left arm    Numbness and tingling of both legs    Open fracture of left elbow 08/18/2015   Prostate cancer [facility])    prostate s/p radiation Mar 2013   Rib fractures 08/17/2015   Tinnitus     Past Surgical History:  Procedure Laterality Date   BOTOX INJECTION N/A 01/14/2020   Procedure: INJECTION OF BOTOX INTO ANAL SPHINCTER;  Surgeon: Andria Meuse, MD;  Location: [facility] ORS;  Service: General;  Laterality: N/A;   COLONOSCOPY N/A 12/19/2012   NFA:OZHYQM 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 Normal appearing colon   COLONOSCOPY WITH PROPOFOL N/A 10/23/2019   Procedure: COLONOSCOPY WITH PROPOFOL;  Surgeon: Corbin Ade, MD; external and grade 2 internal hemorrhoids, abnormal rectal blood vessels consistent with radiation proctitis s/p APC therapy, otherwise normal exam.   EVALUATION UNDER ANESTHESIA WITH ANAL FISTULECTOMY N/A 01/14/2020   Procedure: ANORECTAL EXAM UNDER ANESTHESIA;  Surgeon: Andria Meuse, MD;  Location: [facility] ORS;  Service: General;  Laterality: N/A;   HOT HEMOSTASIS  10/23/2019   Procedure: HOT HEMOSTASIS (ARGON PLASMA COAGULATION/BICAP);  Surgeon:  Corbin Ade, MD;  Location: [facility] SUITE;  Service: Endoscopy;;  apc rectal proctitis     KIDNEY SURGERY  1982   kidney tube collapse repair   RADIOACTIVE SEED IMPLANT     Prostate   SVT ABLATION N/A 12/12/2022   Procedure: SVT ABLATION;  Surgeon: Marinus Maw, MD;  Location: [facility] INVASIVE CV LAB;  Service: Cardiovascular;  Laterality: N/A;    Social History   Socioeconomic History   Marital status: Single    Spouse name: Not on file   Number of children: 2   Years of education: 12   Highest education level: 12th grade  Occupational History   Occupation: Custodian     Employer: [employer]  Tobacco Use   Smoking status: Former    Packs/day: 1.00    Years: 20.00    Additional pack years: 0.00    Total pack years: 20.00    Types: Cigarettes    Quit date: 2020    Years since quitting: 4.4    Passive exposure: Past   Smokeless tobacco: Never   Tobacco comments:    Quit smoking x 2 years    07/2015   SOMETIIMES i USE VAPOR     Smoking Cessation Classes, Agencies, Public librarian  Vaping Use   Vaping Use: Never used  Substance and Sexual Activity   Alcohol use: Never   Drug use: Never   Sexual activity: Not Currently    Partners: Female  Other Topics Concern   Not on file  Social History Narrative   Lives with friend   Divorced   No caffeine   Social Determinants of Health   Financial Resource Strain: Medium Risk (01/08/2023)   Overall Financial Resource Strain (CARDIA)    Difficulty of Paying Living Expenses: Somewhat hard  Food Insecurity: No Food Insecurity (01/08/2023)   Hunger Vital Sign    Worried About Running Out of Food in the Last Year: Never true    Ran Out of Food in the Last Year: Never true  Transportation Needs: No Transportation Needs (01/11/2023)   PRAPARE - Administrator, Civil Service (Medical): No    Lack of Transportation (Non-Medical): No  Physical Activity: Inactive (01/08/2023)   Exercise Vital Sign     Days of Exercise per Week: 0 days    Minutes of Exercise per Session: 0 min  Stress: Stress Concern Present (01/08/2023)   [employer] of Occupational Health - Occupational Stress Questionnaire    Feeling of Stress : Rather much  Social Connections: Unknown (01/08/2023)   Social Connection and Isolation Panel [NHANES]    Frequency of Communication with Friends and Family: More than three times a week    Frequency of Social Gatherings with Friends and Family: More than three times a week    Attends Religious Services: 1 to 4 times per year    Active Member of [employer] or Organizations: No    Attends Banker Meetings: Never    Marital Status: Not on file    Family History  Problem Relation Age of Onset   Aneurysm Mother        aortic   Hypertension Mother    Headache Mother    Prostate cancer Father    Hypertension Father    Colon cancer Neg Hx    Colon polyps Neg Hx     Outpatient Encounter Medications as of 01/16/2023  Medication Sig   methimazole (TAPAZOLE) 5 MG tablet Take 1 tablet (5 mg total) by mouth daily.   Ondansetron HCl (ZOFRAN PO) Take by mouth as needed.   acetaminophen (TYLENOL) 500 MG tablet Take 500 mg by mouth every 6 (six) hours as needed for moderate pain.   alfuzosin (UROXATRAL) 10 MG 24 hr tablet Take 1 tablet (10 mg total) by mouth daily with breakfast.   amLODipine (NORVASC) 5 MG tablet Take 1 tablet (5 mg total) by mouth daily.   buPROPion (WELLBUTRIN XL) 150 MG 24 hr tablet Take 150 mg by mouth daily. (Patient not taking: Reported on 01/16/2023)   Cholecalciferol (VITAMIN D3) 125 MCG (5000 UT) CAPS Take 1 capsule by mouth daily.    cloNIDine (CATAPRES) 0.1 MG tablet Take 1 tablet (0.1 mg total) by mouth 2 (two) times daily as needed (SBP>160). (Patient taking differently: Take 0.1 mg by mouth 3 (three) times daily.)   ELIQUIS 5 MG TABS tablet TAKE 1 TABLET(5 MG) BY MOUTH TWICE DAILY (Patient taking differently: Take 5 mg by mouth 2 (two)  times daily.)   furosemide (LASIX) 20 MG tablet Take 1 tablet (20 mg total) by mouth daily. Take with or after meal.   gabapentin (NEURONTIN) 300 MG capsule Take 300 mg by mouth 3 (three) times daily as needed (nerve pain).   Galcanezumab-gnlm (EMGALITY) 120 MG/ML SOAJ Inject 1 Pen into the skin  every 30 (thirty) days.   hydrOXYzine (ATARAX) 10 MG tablet Take 1 tablet (10 mg total) by mouth 3 (three) times daily as needed for anxiety.   losartan (COZAAR) 100 MG tablet Take 100 mg by mouth daily.   meloxicam (MOBIC) 7.5 MG tablet Take 7.5 mg by mouth 2 (two) times daily as needed.   metoprolol tartrate (LOPRESSOR) 25 MG tablet Take 1 tablet (25 mg total) by mouth 2 (two) times daily. TAKE EXTRA HALF TABLET  AS NEEDED   NURTEC 75 MG TBDP Take 75 mg by mouth daily as needed (pain).   ondansetron (ZOFRAN-ODT) 4 MG disintegrating tablet Take 1 tablet (4 mg total) by mouth every 8 (eight) hours as needed for nausea or vomiting.   polyethylene glycol (MIRALAX / GLYCOLAX) 17 g packet Take 17 g by mouth daily.   potassium chloride SA (KLOR-CON M) 20 MEQ tablet Take 1 tablet (20 mEq total) by mouth daily.   Probiotic Product (PROBIOTIC-10 PO) Take 1 capsule by mouth daily.   traZODone (DESYREL) 50 MG tablet Take 50-150 mg by mouth at bedtime.   Ubrogepant (UBRELVY) 100 MG TABS Take 1 tablet (100 mg total) by mouth as needed (for headaches). May repeat a dose in 2 hours if needed. Max dose 2 pills in 24 hours   vitamin B-12 (CYANOCOBALAMIN) 100 MCG tablet Take 100 mcg by mouth daily.   [DISCONTINUED] methimazole (TAPAZOLE) 10 MG tablet TAKE 1 TABLET(10 MG) BY MOUTH DAILY WITH BREAKFAST   No facility-administered encounter medications on file as of 01/16/2023.    ALLERGIES: Allergies  Allergen Reactions   Latex Rash    Possible reaction to latex gloves per patient   Meclizine     Other reaction(s): Abdominal Pain, Other    VACCINATION STATUS: Immunization History  Administered Date(s) Administered    Influenza,inj,quad, With Preservative 04/03/2019   Influenza-Unspecified 05/26/2020   Moderna Sars-Covid-2 Vaccination 09/07/2019, 10/12/2019     HPI  Steven [name] is 72 y.o. male who was previously seen in 2021 for subclinical hypothyroidism and mild hypocortisolemia.  His subsequent workup did not reveal adrenal dysfunction.  After appropriate workup, he was diagnosed with hypothyroidism, without elevated thyroid uptake and scan evidence.  He was put on methimazole currently 10 mg p.o. once a day at breakfast.  He presents with repeat thyroid function tests which are consistent with over treatment.     He has no new complaints today.  He still complains of fatigue, poorly localized neck pain.  He has more stable body weight currently. he denies dysphagia, choking, shortness of breath, no recent voice change.    he denies  family history of thyroid dysfunction. He  denies family hx of thyroid cancer. he denies personal history of goiter.                           Review of systems  Limited as above.   Objective:    BP 132/76   Pulse 60   Ht 5\' 7"  (1.702 m)   Wt 178 lb (80.7 kg)   BMI 27.88 kg/m   Wt Readings from Last 3 Encounters:  01/16/23 178 lb (80.7 kg)  01/12/23 179 lb 3.2 oz (81.3 kg)  01/07/23 178 lb 9.2 oz (81 kg)     His physical exam is negative for goiter.  CMP     Component Value Date/Time   NA 140 01/07/2023 1953   NA 141 11/10/2019 1051   K 3.9  01/07/2023 1953   CL 104 01/07/2023 1953   CO2 29 01/07/2023 1953   GLUCOSE 109 (H) 01/07/2023 1953   BUN 8 01/07/2023 1953   BUN 8 04/16/2020 0000   CREATININE 0.85 01/07/2023 1953   CALCIUM 9.2 01/07/2023 1953   PROT 6.8 01/07/2023 1953   ALBUMIN 3.6 01/07/2023 1953   AST 15 01/07/2023 1953   ALT 12 01/07/2023 1953   ALKPHOS 68 01/07/2023 1953   BILITOT 0.4 01/07/2023 1953   GFRNONAA >60 01/07/2023 1953   GFRAA >60 02/19/2020 1622     CBC    Component Value Date/Time   WBC 3.4 (L)  01/07/2023 1953   RBC 4.56 01/07/2023 1953   HGB 13.3 01/07/2023 1953   HCT 41.4 01/07/2023 1953   PLT 197 01/07/2023 1953   MCV 90.8 01/07/2023 1953   MCH 29.2 01/07/2023 1953   MCHC 32.1 01/07/2023 1953   RDW 12.7 01/07/2023 1953   LYMPHSABS 1.3 01/07/2023 1953   MONOABS 0.2 01/07/2023 1953   EOSABS 0.1 01/07/2023 1953   BASOSABS 0.0 01/07/2023 1953   Lipid Panel     Component Value Date/Time   CHOL 142 04/16/2020 0000   TRIG 74 04/16/2020 0000   HDL 48 04/16/2020 0000   CHOLHDL 4.4 03/02/2019 0329   VLDL 18 03/02/2019 0329   LDLCALC 79 04/16/2020 0000     Lab Results  Component Value Date   TSH 8.490 (H) 01/12/2023   TSH 3.950 12/20/2022   TSH 0.119 (L) 10/06/2022   TSH 0.015 (L) 09/11/2022   TSH 0.011 (L) 09/08/2022   TSH 0.018 (L) 08/22/2022   TSH 0.030 (L) 08/13/2022   TSH 0.036 (L) 08/10/2022   TSH 0.271 (L) 08/01/2022   TSH 0.415 07/14/2021   FREET4 0.97 01/12/2023   FREET4 1.10 12/20/2022   FREET4 1.48 10/06/2022   FREET4 1.77 09/11/2022   FREET4 2.16 (H) 08/21/2022   FREET4 1.71 (H) 08/01/2022   FREET4 1.19 07/14/2020   FREET4 1.37 01/01/2020   FREET4 1.37 (H) 10/22/2019    Thyroid uptake and scan on Nov 28, 2019 FINDINGS: There is fairly homogeneous thyroid uptake. The left lobe extends more inferiorly than the right, although no focal hot or cold nodules are identified.   4 hour I-123 uptake = 4.1% (normal 5-20%)  24 hour I-123 uptake = 12.9% (normal 10-30%)   IMPRESSION: 1. Low normal radioactive iodine uptake by the thyroid gland. 2. No focal hot or cold nodules identified.    Assessment & Plan:   1. Hyperthyroidism- His previsit labs are consistent with treatment effect with improving thyroid function profile.  He is advised to lower methimazole to 5 mg p.o. once daily at breakfast.  His pulse rate is 60, he was already on beta-blocker-metoprolol 25 mg p.o. twice daily for treatment of hypertension. He will return to clinic for  follow-up with repeat thyroid function tests in 8 weeks. His recent adrenal assessment is indicated  sufficient adrenal response including random cortisol and ACTH stimulation test ruling out adrenal insufficiency.  -Patient is advised to maintain close follow up with Elfredia Nevins, MD for primary care needs.   I spent  19  minutes in the care of the patient today including review of labs from Thyroid Function, CMP, and other relevant labs ; imaging/biopsy records (current and previous including abstractions from other facilities); face-to-face time discussing  his lab results and symptoms, medications doses, his options of short and long term treatment based on the latest standards of care /  guidelines;   and documenting the encounter.  Steven [name]  participated in the discussions, expressed understanding, and voiced agreement with the above plans.  All questions were answered to his satisfaction. he is encouraged to contact clinic should he have any questions or concerns prior to his return visit.    Follow up plan: Return in about 8 weeks (around 03/13/2023) for Fasting Labs  in AM B4 8.   Thank you for involving me in the care of this pleasant patient, and I will continue to update you with his progress.  Marquis Lunch, MD [facility] Phone: [phone]  Fax: [phone]   01/16/2023, 3:51 PM  This note was partially dictated with voice recognition software. Similar sounding words can be transcribed inadequately or may not  be corrected upon review.

## 2023-01-17 NOTE — Telephone Encounter (Signed)
Pt made aware

## 2023-01-18 ENCOUNTER — Ambulatory Visit: Payer: Medicare HMO | Admitting: Cardiovascular Disease

## 2023-01-22 ENCOUNTER — Encounter: Payer: Self-pay | Admitting: *Deleted

## 2023-01-22 ENCOUNTER — Ambulatory Visit: Payer: Self-pay | Admitting: *Deleted

## 2023-01-22 NOTE — Patient Outreach (Signed)
Care Coordination   Follow Up Visit Note   01/22/2023  Name: AZAZEL EZERNACK MRN: [id number] DOB: May 28, 1951  MANARD GAILES is a 72 y.o. year old male who sees Elfredia Nevins, MD for primary care. I spoke with [name] by phone today.  What matters to the patients health and wellness today?  Receive Counseling & Supportive Services for Symptoms of Anxiety.   Goals Addressed               This Visit's Progress     Receive Counseling & Supportive Services for Symptoms of Anxiety. (pt-stated)   On track     Care Coordination Interventions:  Interventions Today    Flowsheet Row Most Recent Value  Chronic Disease   Chronic disease during today's visit Hypertension (HTN), Atrial Fibrillation (AFib), Other  [Uncontrolled Blood Pressure, Financial Insecurities & Anxiety]  General Interventions   General Interventions Discussed/Reviewed General Interventions Discussed, Labs, Vaccines, Doctor Visits, Health Screening, Annual Foot Exam, General Interventions Reviewed, Annual Eye Exam, Durable Medical Equipment (DME), [employer], Level of Care, Communication with  [Encouraged]  Labs Kidney Function  [Encouraged]  Vaccines COVID-19, Flu, Pneumonia, RSV, Tetanus/Pertussis/Diphtheria, Shingles  [Encouraged]  Doctor Visits Discussed/Reviewed Doctor Visits Discussed, Specialist, Doctor Visits Reviewed, Annual Wellness Visits, PCP  [Encouraged]  Health Screening Bone Density, Colonoscopy, Prostate  [Encouraged]  Durable Medical Equipment (DME) BP Cuff, Wheelchair  [Encouraged]  Wheelchair Standard  [Encouraged]  PCP/Specialist Visits Compliance with follow-up visit  [Encouraged]  Communication with RN, PCP/Specialists  [Encouraged]  Level of Care Adult Daycare, Applications, Assisted Living, Personal Care Services  [Encouraged]  Applications Medicaid, Personal Care Services  [Encouraged]  Exercise Interventions   Exercise Discussed/Reviewed Exercise Discussed, Assistive device  use and maintanence, Exercise Reviewed, Physical Activity, Weight Managment  [Encouraged]  Physical Activity Discussed/Reviewed Physical Activity Discussed, Home Exercise Program (HEP), Physical Activity Reviewed, Types of exercise  [Encouraged]  Weight Management Weight loss  [Encouraged]  Education Interventions   Education Provided Provided Therapist, sports, Provided Web-based Education, Provided Education  [Encouraged]  Provided Verbal Education On Nutrition, Mental Health/Coping with Illness, When to see the doctor, Foot Care, Eye Care, Labs, Applications, Exercise, Medication, Development worker, community, [employer] Resources  [Encouraged]  Labs Reviewed Kidney Function, Lipid Profile  [Encouraged]  Applications Medicaid, Personal Care Services  [Encouraged]  Mental Health Interventions   Mental Health Discussed/Reviewed Mental Health Discussed, Anxiety, Depression, Mental Health Reviewed, Grief and Loss, Substance Abuse, Coping Strategies, Suicide, Crisis, Other  [Domestic Violence]  Nutrition Interventions   Nutrition Discussed/Reviewed Nutrition Discussed, Adding fruits and vegetables, Increasing proteins, Decreasing fats, Decreasing salt, Nutrition Reviewed, Fluid intake, Carbohydrate meal planning, Portion sizes, Decreasing sugar intake  [Encouraged]  Pharmacy Interventions   Pharmacy Dicussed/Reviewed Pharmacy Topics Discussed, Medications and their functions, Medication Adherence, Pharmacy Topics Reviewed, Affording Medications  [Encouraged]  Safety Interventions   Safety Discussed/Reviewed Safety Discussed, Safety Reviewed, Fall Risk, Home Safety  [Encouraged]  Home Safety Assistive Devices, Need for home safety assessment, Refer for community resources  [Encouraged]  Advanced Directive Interventions   Advanced Directives Discussed/Reviewed Advanced Directives Discussed  [Encouraged]     Active Listening & Reflection Utilized.  Verbalization of Feelings Encouraged.  Emotional Support  Provided. Feelings of Frustration Validated. Symptoms of Anxiety & Panic Attacks Acknowledged. Caregiver Resources Reviewed. Caregiver Support Groups Provided. Crisis Support Information, Agencies, Services, & Resources Revisited. Problem Solving Interventions Indicated. Task-Centered Solutions Activated.   Solution-Focused Strategies Employed. Acceptance & Commitment Therapy Performed. Cognitive Behavioral Therapy Initiated. Client-Centered Therapy Introduced. Deep Breathing Exercises,  Relaxation Techniques, & Mindfulness Meditation Strategies Reviewed & Implementation Encouraged Daily. Encouraged Referral to Psychiatrist for Psychotropic Medication Administration & Management, from List Provided. Encouraged Referral to Therapist for Psychotherapeutic Counseling & Supportive Services, from List Provided. Confirmed Receipt & Thoroughly Reviewed the Following List of [employer], Nurse, adult, Resources, Occupational psychologist, to [employer] Understanding: ~ Adult Day Care Programs  ~ [employer] & Respite Agencies ~ Home Health Care Agencies ~ Respite Care Agencies & Facilities ~ Personal Care Services Application  ~ Development worker, international aid Agency Providers ~ 2023 Medicaid Tips ~ [employer] Application ~ [employer] Financial Assistance Application  Encouraged Careers information officer with [employer], Nurse, adult, & Resources of Interest, in An Effort to Obtain Care & Supervision in [facility] [employer]. Encouraged Completion of Medicaid Application & Submission to [facility] of Social Services [phone]), for Processing. Encouraged Completion of Application for Personal Care Services & Submission to [facility] [phone]) for Processing, Once Approved for Medicaid, through [facility] of Social Services [phone]). Control and instrumentation engineer with CSW (# [phone]), if You Have Questions, Need Assistance, or If Additional Social Work  Needs Are Identified Between Now & Our Next Scheduled Telephone [employer].      SDOH assessments and interventions completed:  Yes.  Care Coordination Interventions:  Yes, provided.   Follow up plan: Follow up call scheduled for 02/05/2023 at 3:15 pm.   Encounter Outcome:  Pt. Visit Completed.   Danford Bad, BSW, MSW, LCSW  Licensed Restaurant manager, fast food [facility]  [address] [place] [address], [place], [place] [address] Physical Address-[address], [place], [place] [address] Toll Free Main # [phone] Fax # [phone] Cell # [phone] [email or web address]

## 2023-01-22 NOTE — Patient Instructions (Signed)
Visit Information  Thank you for taking time to visit with me today. Please don't hesitate to contact me if I can be of assistance to you.   Following are the goals we discussed today:   Goals Addressed               This Visit's Progress     Receive Counseling & Supportive Services for Symptoms of Anxiety. (pt-stated)   On track     Care Coordination Interventions:  Interventions Today    Flowsheet Row Most Recent Value  Chronic Disease   Chronic disease during today's visit Hypertension (HTN), Atrial Fibrillation (AFib), Other  [Uncontrolled Blood Pressure, Financial Insecurities & Anxiety]  General Interventions   General Interventions Discussed/Reviewed General Interventions Discussed, Labs, Vaccines, Doctor Visits, Health Screening, Annual Foot Exam, General Interventions Reviewed, Annual Eye Exam, Durable Medical Equipment (DME), [employer], Level of Care, Communication with  [Encouraged]  Labs Kidney Function  [Encouraged]  Vaccines COVID-19, Flu, Pneumonia, RSV, Tetanus/Pertussis/Diphtheria, Shingles  [Encouraged]  Doctor Visits Discussed/Reviewed Doctor Visits Discussed, Specialist, Doctor Visits Reviewed, Annual Wellness Visits, PCP  [Encouraged]  Health Screening Bone Density, Colonoscopy, Prostate  [Encouraged]  Durable Medical Equipment (DME) BP Cuff, Wheelchair  [Encouraged]  Wheelchair Standard  [Encouraged]  PCP/Specialist Visits Compliance with follow-up visit  [Encouraged]  Communication with RN, PCP/Specialists  [Encouraged]  Level of Care Adult Daycare, Applications, Assisted Living, Personal Care Services  [Encouraged]  Applications Medicaid, Personal Care Services  [Encouraged]  Exercise Interventions   Exercise Discussed/Reviewed Exercise Discussed, Assistive device use and maintanence, Exercise Reviewed, Physical Activity, Weight Managment  [Encouraged]  Physical Activity Discussed/Reviewed Physical Activity Discussed, Home Exercise Program (HEP),  Physical Activity Reviewed, Types of exercise  [Encouraged]  Weight Management Weight loss  [Encouraged]  Education Interventions   Education Provided Provided Therapist, sports, Provided Web-based Education, Provided Education  [Encouraged]  Provided Verbal Education On Nutrition, Mental Health/Coping with Illness, When to see the doctor, Foot Care, Eye Care, Labs, Applications, Exercise, Medication, Development worker, community, [employer] Resources  [Encouraged]  Labs Reviewed Kidney Function, Lipid Profile  [Encouraged]  Applications Medicaid, Personal Care Services  [Encouraged]  Mental Health Interventions   Mental Health Discussed/Reviewed Mental Health Discussed, Anxiety, Depression, Mental Health Reviewed, Grief and Loss, Substance Abuse, Coping Strategies, Suicide, Crisis, Other  [Domestic Violence]  Nutrition Interventions   Nutrition Discussed/Reviewed Nutrition Discussed, Adding fruits and vegetables, Increasing proteins, Decreasing fats, Decreasing salt, Nutrition Reviewed, Fluid intake, Carbohydrate meal planning, Portion sizes, Decreasing sugar intake  [Encouraged]  Pharmacy Interventions   Pharmacy Dicussed/Reviewed Pharmacy Topics Discussed, Medications and their functions, Medication Adherence, Pharmacy Topics Reviewed, Affording Medications  [Encouraged]  Safety Interventions   Safety Discussed/Reviewed Safety Discussed, Safety Reviewed, Fall Risk, Home Safety  [Encouraged]  Home Safety Assistive Devices, Need for home safety assessment, Refer for community resources  [Encouraged]  Advanced Directive Interventions   Advanced Directives Discussed/Reviewed Advanced Directives Discussed  [Encouraged]     Active Listening & Reflection Utilized.  Verbalization of Feelings Encouraged.  Emotional Support Provided. Feelings of Frustration Validated. Symptoms of Anxiety & Panic Attacks Acknowledged. Caregiver Resources Reviewed. Caregiver Support Groups Provided. Crisis Support Information,  Agencies, Services, & Resources Revisited. Problem Solving Interventions Indicated. Task-Centered Solutions Activated.   Solution-Focused Strategies Employed. Acceptance & Commitment Therapy Performed. Cognitive Behavioral Therapy Initiated. Client-Centered Therapy Introduced. Deep Breathing Exercises, Relaxation Techniques, & Mindfulness Meditation Strategies Reviewed & Implementation Encouraged Daily. Encouraged Referral to Psychiatrist for Psychotropic Medication Administration & Management, from List Provided. Encouraged Referral to Therapist for Psychotherapeutic Counseling &  Supportive Services, from List Provided. Confirmed Receipt & Thoroughly Reviewed the Following List of [employer], Nurse, adult, Resources, Occupational psychologist, to [employer] Understanding: ~ Adult Day Care Programs  ~ [employer] & Respite Agencies ~ Home Health Care Agencies ~ Respite Care Agencies & Facilities ~ Personal Care Services Application  ~ Development worker, international aid Agency Providers ~ 2023 Medicaid Tips ~ [employer] Application ~ [employer] Financial Assistance Application  Encouraged Careers information officer with [employer], Nurse, adult, & Resources of Interest, in An Effort to Obtain Care & Supervision in [facility] [employer]. Encouraged Completion of Medicaid Application & Submission to The [facility] of Social Services [phone]), for Processing. Encouraged Completion of Application for Personal Care Services & Submission to [facility] [phone]) for Processing, Once Approved for Medicaid, through [facility] of Social Services [phone]). Control and instrumentation engineer with CSW (# [phone]), if You Have Questions, Need Assistance, or If Additional Social Work Needs Are Identified Between Now & Our Next Scheduled Telephone [employer].      Our next appointment is by telephone on 02/05/2023 at 3:15 pm.   Please call the care guide team at  [phone] if you need to cancel or reschedule your appointment.   If you are experiencing a Mental Health or Behavioral Health Crisis or need someone to talk to, please call the Suicide and Crisis Lifeline: 988 call the [place] National Suicide Prevention Lifeline: 660 344 5290 or TTY: 3328055461 TTY 615-405-7564) to talk to a trained counselor call 1-800-273-TALK (toll free, 24 hour hotline) go to [facility] [address] [place] [phone]) call the [facility] Crisis Line: [phone] call 911  Patient verbalizes understanding of instructions and care plan provided today and agrees to view in MyChart. Active MyChart status and patient understanding of how to access instructions and care plan via MyChart confirmed with patient.     Telephone follow up appointment with care management team member scheduled for:  02/05/2023 at 3:15 pm.   Danford Bad, BSW, MSW, LCSW  Licensed Clinical Social Worker  [facility]  Mailing [place]. [address], [place], [place] [address] Physical Address-[address] [place], [place] [address] Toll Free Main # [phone] Fax # [phone] Cell # [phone] [email or web address]

## 2023-01-23 DIAGNOSIS — K581 Irritable bowel syndrome with constipation: Secondary | ICD-10-CM | POA: Diagnosis not present

## 2023-01-24 ENCOUNTER — Ambulatory Visit (INDEPENDENT_AMBULATORY_CARE_PROVIDER_SITE_OTHER): Payer: Medicare HMO | Admitting: Family Medicine

## 2023-01-24 ENCOUNTER — Encounter: Payer: Self-pay | Admitting: Family Medicine

## 2023-01-24 VITALS — BP 129/74 | HR 60 | Ht 67.0 in | Wt 177.0 lb

## 2023-01-24 DIAGNOSIS — I10 Essential (primary) hypertension: Secondary | ICD-10-CM | POA: Diagnosis not present

## 2023-01-24 DIAGNOSIS — E559 Vitamin D deficiency, unspecified: Secondary | ICD-10-CM | POA: Diagnosis not present

## 2023-01-24 DIAGNOSIS — F5104 Psychophysiologic insomnia: Secondary | ICD-10-CM | POA: Diagnosis not present

## 2023-01-24 DIAGNOSIS — Z131 Encounter for screening for diabetes mellitus: Secondary | ICD-10-CM

## 2023-01-24 DIAGNOSIS — Z6828 Body mass index (BMI) 28.0-28.9, adult: Secondary | ICD-10-CM | POA: Diagnosis not present

## 2023-01-24 DIAGNOSIS — E538 Deficiency of other specified B group vitamins: Secondary | ICD-10-CM

## 2023-01-24 DIAGNOSIS — M47812 Spondylosis without myelopathy or radiculopathy, cervical region: Secondary | ICD-10-CM | POA: Diagnosis not present

## 2023-01-24 DIAGNOSIS — Z1159 Encounter for screening for other viral diseases: Secondary | ICD-10-CM

## 2023-01-24 DIAGNOSIS — Z13220 Encounter for screening for lipoid disorders: Secondary | ICD-10-CM

## 2023-01-24 MED ORDER — LORATADINE 10 MG PO TABS: 10.0000 mg | ORAL_TABLET | Freq: Every day | ORAL | 11 refills | Status: DC

## 2023-01-24 MED ORDER — MIRTAZAPINE 7.5 MG PO TABS
7.5000 mg | ORAL_TABLET | Freq: Every day | ORAL | 1 refills | Status: DC
Start: 2023-01-24 — End: 2023-04-18

## 2023-01-24 MED ORDER — AZELASTINE-FLUTICASONE 137-50 MCG/ACT NA SUSP: 1.0000 | Freq: Two times a day (BID) | NASAL | 1 refills | Status: DC

## 2023-01-24 NOTE — Assessment & Plan Note (Signed)
Patient reported has tried Trazodone 50 mg will no relief Trial on Remeron 7.5 mg at bedtime  Explained to go to bed at the same time each night and get up at the same time each morning, including on the weekends. Make sure your bedroom is quiet, dark, relaxing, and at a comfortable temperature. Remove electronic devices, such as TVs, computers, and smart phones, from the bedroom.  Follow up in 6 weeks

## 2023-01-24 NOTE — Progress Notes (Signed)
New Patient Office Visit   Subjective   Patient ID: [name], male    DOB: 12/13/1950  Age: 72 y.o. MRN: [id number]  CC:  Chief Complaint  Patient presents with   Establish Care    Patient complains of issues with IBS, hyperthyroid, HTN.     HPI Steven [name] 41 one year old male, presents to establish care. He  has a past medical history of Anxiety, Arthritis, Atrial fibrillation (HCC), Atrial flutter (HCC), BPH (benign prostatic hyperplasia), Chest pain, Dizziness, Dyspnea, Fracture (08/17/2015), GERD (gastroesophageal reflux disease), Hemorrhoids, History of kidney stones, History of radiation therapy (08/22/11-10/13/11), Hyperlipemia, Hypertension, Hyperthyroidism, IBS (irritable bowel syndrome), Insomnia, Light headedness, Migraine, Numbness and tingling in left arm, Numbness and tingling of both legs, Open fracture of left elbow (08/18/2015), Prostate cancer (HCC), Rib fractures (08/17/2015), and Tinnitus.  Patient here for HTN management. He is not exercising and is adherent to low salt diet.  Blood pressure is well controlled at home. Cardiac symptoms chest pain, dyspnea, fatigue, and palpitations. Patient denies lower extremity edema, near-syncope, and tachypnea.  Cardiovascular risk factors: advanced age  family history of premature cardiovascular disease, hypertension, and male gender.    Insomnia Primary symptoms: sleep disturbance, difficulty falling asleep. Sleeping difficulty occurs nightly and has been gradually worsening since onset. The symptoms are aggravated by anxiety. The symptoms are relieved by relaxation and certain positions  and night light. Treatments tried: trazodone. The treatment provided no relief. PMH includes: hypertension, family stress or anxiety, chronic pain.          Outpatient Encounter Medications as of 01/24/2023  Medication Sig   acetaminophen (TYLENOL) 500 MG tablet Take 500 mg by mouth every 6 (six) hours as needed for moderate pain.    alfuzosin (UROXATRAL) 10 MG 24 hr tablet Take 1 tablet (10 mg total) by mouth daily with breakfast.   amLODipine (NORVASC) 5 MG tablet Take 1 tablet (5 mg total) by mouth daily.   Azelastine-Fluticasone 137-50 MCG/ACT SUSP Place 1 spray into the nose every 12 (twelve) hours.   Cholecalciferol (VITAMIN D3) 125 MCG (5000 UT) CAPS Take 1 capsule by mouth daily.    cloNIDine (CATAPRES) 0.1 MG tablet Take 1 tablet (0.1 mg total) by mouth 2 (two) times daily as needed (SBP>160). (Patient taking differently: Take 0.1 mg by mouth 3 (three) times daily.)   ELIQUIS 5 MG TABS tablet TAKE 1 TABLET(5 MG) BY MOUTH TWICE DAILY (Patient taking differently: Take 5 mg by mouth 2 (two) times daily.)   furosemide (LASIX) 20 MG tablet Take 1 tablet (20 mg total) by mouth daily. Take with or after meal.   gabapentin (NEURONTIN) 300 MG capsule Take 300 mg by mouth 3 (three) times daily as needed (nerve pain).   Galcanezumab-gnlm (EMGALITY) 120 MG/ML SOAJ Inject 1 Pen into the skin every 30 (thirty) days.   hydrOXYzine (ATARAX) 10 MG tablet Take 1 tablet (10 mg total) by mouth 3 (three) times daily as needed for anxiety.   loratadine (CLARITIN) 10 MG tablet Take 1 tablet (10 mg total) by mouth daily.   losartan (COZAAR) 100 MG tablet Take 100 mg by mouth daily.   meloxicam (MOBIC) 7.5 MG tablet Take 7.5 mg by mouth 2 (two) times daily as needed.   methimazole (TAPAZOLE) 5 MG tablet Take 1 tablet (5 mg total) by mouth daily.   mirtazapine (REMERON) 7.5 MG tablet Take 1 tablet (7.5 mg total) by mouth at bedtime.   NURTEC 75 MG TBDP Take 75  mg by mouth daily as needed (pain).   ondansetron (ZOFRAN-ODT) 4 MG disintegrating tablet Take 1 tablet (4 mg total) by mouth every 8 (eight) hours as needed for nausea or vomiting.   Ondansetron HCl (ZOFRAN PO) Take by mouth as needed.   polyethylene glycol (MIRALAX / GLYCOLAX) 17 g packet Take 17 g by mouth daily.   potassium chloride SA (KLOR-CON M) 20 MEQ tablet Take 1 tablet (20 mEq  total) by mouth daily.   Probiotic Product (PROBIOTIC-10 PO) Take 1 capsule by mouth daily.   Ubrogepant (UBRELVY) 100 MG TABS Take 1 tablet (100 mg total) by mouth as needed (for headaches). May repeat a dose in 2 hours if needed. Max dose 2 pills in 24 hours   vitamin B-12 (CYANOCOBALAMIN) 100 MCG tablet Take 100 mcg by mouth daily.   [DISCONTINUED] buPROPion (WELLBUTRIN XL) 150 MG 24 hr tablet Take 150 mg by mouth daily.   [DISCONTINUED] traZODone (DESYREL) 50 MG tablet Take 50-150 mg by mouth at bedtime.   metoprolol tartrate (LOPRESSOR) 25 MG tablet Take 1 tablet (25 mg total) by mouth 2 (two) times daily. TAKE EXTRA HALF TABLET  AS NEEDED   No facility-administered encounter medications on file as of 01/24/2023.    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 Normal appearing colon   COLONOSCOPY WITH PROPOFOL N/A 10/23/2019   Procedure: COLONOSCOPY WITH PROPOFOL;  Surgeon: Corbin Ade, MD; external and grade 2 internal hemorrhoids, abnormal rectal blood vessels consistent with radiation proctitis s/p APC therapy, otherwise normal exam.   EVALUATION UNDER ANESTHESIA WITH ANAL FISTULECTOMY N/A 01/14/2020   Procedure: ANORECTAL EXAM UNDER ANESTHESIA;  Surgeon: Andria Meuse, MD;  Location: [facility];  Service: General;  Laterality: N/A;   HOT HEMOSTASIS  10/23/2019   Procedure: HOT HEMOSTASIS (ARGON PLASMA COAGULATION/BICAP);  Surgeon: Corbin Ade, MD;  Location: [facility];  Service: Endoscopy;;  apc rectal proctitis     KIDNEY SURGERY  1982   kidney tube collapse repair   RADIOACTIVE SEED IMPLANT     Prostate   SVT ABLATION N/A 12/12/2022   Procedure: SVT  ABLATION;  Surgeon: Marinus Maw, MD;  Location: [facility];  Service: Cardiovascular;  Laterality: N/A;    Review of Systems  Constitutional:  Negative for chills and fever.  HENT:  Positive for congestion.   Eyes:  Negative for blurred vision.  Respiratory:  Positive for shortness of breath.   Cardiovascular:  Positive for chest pain.  Gastrointestinal:  Negative for abdominal pain.  Genitourinary:  Negative for dysuria.  Neurological:  Negative for dizziness and headaches.      Objective    BP 129/74   Pulse 60   Ht 5\' 7"  (1.702 m)   Wt 177 lb (80.3 kg)   SpO2 94%   BMI 27.72 kg/m   Physical Exam Vitals reviewed.  Constitutional:      General: He is not in acute distress.    Appearance: Normal appearance. He is not ill-appearing, toxic-appearing or diaphoretic.  HENT:     Head: Normocephalic.     Right Ear: Tympanic membrane normal.     Left Ear: Tympanic membrane normal.  Eyes:  General:        Right eye: No discharge.        Left eye: No discharge.     Conjunctiva/sclera: Conjunctivae normal.  Cardiovascular:     Rate and Rhythm: Normal rate.     Pulses: Normal pulses.     Heart sounds: Normal heart sounds.  Pulmonary:     Effort: Pulmonary effort is normal. No respiratory distress.     Breath sounds: Normal breath sounds.  Abdominal:     General: Bowel sounds are normal.     Palpations: Abdomen is soft.     Tenderness: There is no abdominal tenderness. There is no guarding.  Musculoskeletal:     Cervical back: Normal range of motion.     Lumbar back: Decreased range of motion.  Skin:    General: Skin is warm and dry.     Capillary Refill: Capillary refill takes less than 2 seconds.  Neurological:     General: No focal deficit present.     Mental Status: He is alert and oriented to person, place, and time.     Coordination: Coordination normal.     Gait: Gait normal.  Psychiatric:        Mood and Affect: Mood normal.        Behavior:  Behavior normal.       Assessment & Plan:  Screening for diabetes mellitus -     Hemoglobin A1c  Screening for lipid disorders -     Lipid panel  Primary hypertension Assessment & Plan: Vitals:   01/24/23 0817  BP: 129/74   Patient reported currently taking Amlodipine 5 mg daily, lasix 20 mg daily, losartan 100 mg, Metoprolol 25 mg twice daily Continued discussion on DASH diet, low sodium diet and maintain a exercise routine for 150 minutes per week.   Orders: -     Microalbumin / creatinine urine ratio  Need for hepatitis C screening test -     Hepatitis C antibody  Vitamin D deficiency -     VITAMIN D 25 Hydroxy (Vit-D Deficiency, Fractures)  Vitamin B12 deficiency -     Vitamin B12  Psychophysiologic insomnia Assessment & Plan: Patient reported has tried Trazodone 50 mg will no relief Trial on Remeron 7.5 mg at bedtime  Explained to go to bed at the same time each night and get up at the same time each morning, including on the weekends. Make sure your bedroom is quiet, dark, relaxing, and at a comfortable temperature. Remove electronic devices, such as TVs, computers, and smart phones, from the bedroom.  Follow up in 6 weeks    Other orders -     Azelastine-Fluticasone; Place 1 spray into the nose every 12 (twelve) hours.  Dispense: 23 g; Refill: 1 -     Loratadine; Take 1 tablet (10 mg total) by mouth daily.  Dispense: 30 tablet; Refill: 11 -     Mirtazapine; Take 1 tablet (7.5 mg total) by mouth at bedtime.  Dispense: 30 tablet; Refill: 1    Return in about 6 weeks (around 03/07/2023), or if symptoms worsen or fail to improve, for Insomnia.   Cruzita Lederer Newman Nip, FNP

## 2023-01-24 NOTE — Patient Instructions (Signed)

## 2023-01-24 NOTE — Assessment & Plan Note (Signed)
Vitals:   01/24/23 0817  BP: 129/74   Patient reported currently taking Amlodipine 5 mg daily, lasix 20 mg daily, losartan 100 mg, Metoprolol 25 mg twice daily Continued discussion on DASH diet, low sodium diet and maintain a exercise routine for 150 minutes per week.

## 2023-01-29 DIAGNOSIS — Z131 Encounter for screening for diabetes mellitus: Secondary | ICD-10-CM | POA: Diagnosis not present

## 2023-01-29 DIAGNOSIS — E559 Vitamin D deficiency, unspecified: Secondary | ICD-10-CM | POA: Diagnosis not present

## 2023-01-29 DIAGNOSIS — I10 Essential (primary) hypertension: Secondary | ICD-10-CM | POA: Diagnosis not present

## 2023-01-29 DIAGNOSIS — Z1159 Encounter for screening for other viral diseases: Secondary | ICD-10-CM | POA: Diagnosis not present

## 2023-01-29 DIAGNOSIS — E538 Deficiency of other specified B group vitamins: Secondary | ICD-10-CM | POA: Diagnosis not present

## 2023-01-29 DIAGNOSIS — Z13220 Encounter for screening for lipoid disorders: Secondary | ICD-10-CM | POA: Diagnosis not present

## 2023-01-30 LAB — HEMOGLOBIN A1C
Est. average glucose Bld gHb Est-mCnc: 117 mg/dL
Hgb A1c MFr Bld: 5.7 % — ABNORMAL HIGH (ref 4.8–5.6)

## 2023-01-30 LAB — VITAMIN D 25 HYDROXY (VIT D DEFICIENCY, FRACTURES): Vit D, 25-Hydroxy: 32.8 ng/mL (ref 30.0–100.0)

## 2023-01-30 LAB — LIPID PANEL
Chol/HDL Ratio: 3.2 ratio (ref 0.0–5.0)
HDL: 49 mg/dL (ref >39)
LDL Chol Calc (NIH): 97 mg/dL (ref 0–99)
VLDL Cholesterol Cal: 12 mg/dL (ref 5–40)

## 2023-01-30 LAB — VITAMIN B12: Vitamin B-12: 1572 pg/mL — ABNORMAL HIGH (ref 232–1245)

## 2023-01-30 LAB — MICROALBUMIN / CREATININE URINE RATIO

## 2023-01-31 LAB — LIPID PANEL
Cholesterol, Total: 158 mg/dL (ref 100–199)
Triglycerides: 58 mg/dL (ref 0–149)

## 2023-01-31 LAB — HEPATITIS C ANTIBODY: Hep C Virus Ab: NONREACTIVE

## 2023-01-31 LAB — MICROALBUMIN / CREATININE URINE RATIO: Creatinine, Urine: 13.2 mg/dL

## 2023-02-01 ENCOUNTER — Telehealth: Payer: Self-pay | Admitting: Psychiatry

## 2023-02-01 ENCOUNTER — Encounter: Payer: Self-pay | Admitting: Family Medicine

## 2023-02-01 ENCOUNTER — Ambulatory Visit (INDEPENDENT_AMBULATORY_CARE_PROVIDER_SITE_OTHER): Payer: Medicare HMO | Admitting: Family Medicine

## 2023-02-01 VITALS — BP 137/76 | HR 53 | Ht 67.0 in | Wt 180.0 lb

## 2023-02-01 DIAGNOSIS — G8929 Other chronic pain: Secondary | ICD-10-CM | POA: Diagnosis not present

## 2023-02-01 DIAGNOSIS — M4802 Spinal stenosis, cervical region: Secondary | ICD-10-CM | POA: Diagnosis not present

## 2023-02-01 DIAGNOSIS — M47812 Spondylosis without myelopathy or radiculopathy, cervical region: Secondary | ICD-10-CM | POA: Diagnosis not present

## 2023-02-01 MED ORDER — GABAPENTIN 600 MG PO TABS
600.0000 mg | ORAL_TABLET | Freq: Three times a day (TID) | ORAL | 2 refills | Status: DC | PRN
Start: 2023-02-01 — End: 2023-02-19

## 2023-02-01 NOTE — Telephone Encounter (Signed)
Pt said in the last week having worsening body pain, tingling, aches. Taking Gabapentin and Tylenol and not working. Would like a call from the nurse.

## 2023-02-01 NOTE — Assessment & Plan Note (Signed)
Widespread pain, Possible Fibromyalgia Increased Gabapentin 600 mg 3 times PRN Advise to follow up with Neurology for further evaluation.

## 2023-02-01 NOTE — Patient Instructions (Signed)
        Great to see you today.   - Please take medications as prescribed. - Follow up with your primary health provider if any health concerns arises. - If symptoms worsen please contact your primary care provider and/or visit the emergency department.  

## 2023-02-01 NOTE — Telephone Encounter (Signed)
Pt returned call but had to LVM. Please call pt back when available.

## 2023-02-01 NOTE — Telephone Encounter (Signed)
LVM for pt to call back before 5pm today 

## 2023-02-01 NOTE — Progress Notes (Signed)
Patient Office Visit   Subjective   Patient ID: Steven [name], male    DOB: 04-Aug-1950  Age: 72 y.o. MRN: [id number]  CC:  Chief Complaint  Patient presents with   Pain    Patient complains of sharp, stabbing pains all over his body. Burning and tingling in arms down to fingers. All starting about 3 months ago, getting worse and mostly at night. With L arm and shoulder pain, with heart fluttering. Sees cardiology and they are aware, had several EKGs last month.     HPI Steven [name] 72 year old male, presents to the clinic for chronic pain . He  has a past medical history of Anxiety, Arthritis, Atrial fibrillation (HCC), Atrial flutter (HCC), BPH (benign prostatic hyperplasia), Chest pain, Dizziness, Dyspnea, Fracture (08/17/2015), GERD (gastroesophageal reflux disease), Hemorrhoids, History of kidney stones, History of radiation therapy (08/22/11-10/13/11), Hyperlipemia, Hypertension, Hyperthyroidism, IBS (irritable bowel syndrome), Insomnia, Light headedness, Migraine, Numbness and tingling in left arm, Numbness and tingling of both legs, Open fracture of left elbow (08/18/2015), Prostate cancer (HCC), Rib fractures (08/17/2015), and Tinnitus.For the details of today's visit, please refer to assessment and plan.   HPI    Outpatient Encounter Medications as of 02/01/2023  Medication Sig   acetaminophen (TYLENOL) 500 MG tablet Take 500 mg by mouth every 6 (six) hours as needed for moderate pain.   alfuzosin (UROXATRAL) 10 MG 24 hr tablet Take 1 tablet (10 mg total) by mouth daily with breakfast.   amLODipine (NORVASC) 5 MG tablet Take 1 tablet (5 mg total) by mouth daily.   Azelastine-Fluticasone 137-50 MCG/ACT SUSP Place 1 spray into the nose every 12 (twelve) hours.   Cholecalciferol (VITAMIN D3) 125 MCG (5000 UT) CAPS Take 1 capsule by mouth daily.    cloNIDine (CATAPRES) 0.1 MG tablet Take 1 tablet (0.1 mg total) by mouth 2 (two) times daily as needed (SBP>160). (Patient taking  differently: Take 0.1 mg by mouth 3 (three) times daily.)   ELIQUIS 5 MG TABS tablet TAKE 1 TABLET(5 MG) BY MOUTH TWICE DAILY (Patient taking differently: Take 5 mg by mouth 2 (two) times daily.)   furosemide (LASIX) 20 MG tablet Take 1 tablet (20 mg total) by mouth daily. Take with or after meal.   gabapentin (NEURONTIN) 600 MG tablet Take 1 tablet (600 mg total) by mouth 3 (three) times daily as needed.   Galcanezumab-gnlm (EMGALITY) 120 MG/ML SOAJ Inject 1 Pen into the skin every 30 (thirty) days.   hydrOXYzine (ATARAX) 10 MG tablet Take 1 tablet (10 mg total) by mouth 3 (three) times daily as needed for anxiety.   hyoscyamine (LEVBID) 0.375 MG 12 hr tablet Take 0.375 mg by mouth 2 (two) times daily.   loratadine (CLARITIN) 10 MG tablet Take 1 tablet (10 mg total) by mouth daily.   losartan (COZAAR) 100 MG tablet Take 100 mg by mouth daily.   meloxicam (MOBIC) 7.5 MG tablet Take 7.5 mg by mouth 2 (two) times daily as needed.   methimazole (TAPAZOLE) 5 MG tablet Take 1 tablet (5 mg total) by mouth daily.   mirtazapine (REMERON) 7.5 MG tablet Take 1 tablet (7.5 mg total) by mouth at bedtime.   NURTEC 75 MG TBDP Take 75 mg by mouth daily as needed (pain).   ondansetron (ZOFRAN-ODT) 4 MG disintegrating tablet Take 1 tablet (4 mg total) by mouth every 8 (eight) hours as needed for nausea or vomiting.   Ondansetron HCl (ZOFRAN PO) Take by mouth as needed.  polyethylene glycol (MIRALAX / GLYCOLAX) 17 g packet Take 17 g by mouth daily.   potassium chloride SA (KLOR-CON M) 20 MEQ tablet Take 1 tablet (20 mEq total) by mouth daily.   Probiotic Product (PROBIOTIC-10 PO) Take 1 capsule by mouth daily.   Ubrogepant (UBRELVY) 100 MG TABS Take 1 tablet (100 mg total) by mouth as needed (for headaches). May repeat a dose in 2 hours if needed. Max dose 2 pills in 24 hours   vitamin B-12 (CYANOCOBALAMIN) 100 MCG tablet Take 100 mcg by mouth daily.   [DISCONTINUED] DULoxetine (CYMBALTA) 20 MG capsule Take by  mouth.   [DISCONTINUED] gabapentin (NEURONTIN) 300 MG capsule Take 300 mg by mouth 3 (three) times daily as needed (nerve pain).   metoprolol tartrate (LOPRESSOR) 25 MG tablet Take 1 tablet (25 mg total) by mouth 2 (two) times daily. TAKE EXTRA HALF TABLET  AS NEEDED   No facility-administered encounter medications on file as of 02/01/2023.    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 Normal appearing colon   COLONOSCOPY WITH PROPOFOL N/A 10/23/2019   Procedure: COLONOSCOPY WITH PROPOFOL;  Surgeon: Corbin Ade, MD; external and grade 2 internal hemorrhoids, abnormal rectal blood vessels consistent with radiation proctitis s/p APC therapy, otherwise normal exam.   EVALUATION UNDER ANESTHESIA WITH ANAL FISTULECTOMY N/A 01/14/2020   Procedure: ANORECTAL EXAM UNDER ANESTHESIA;  Surgeon: Andria Meuse, MD;  Location: [facility];  Service: General;  Laterality: N/A;   HOT HEMOSTASIS  10/23/2019   Procedure: HOT HEMOSTASIS (ARGON PLASMA COAGULATION/BICAP);  Surgeon: Corbin Ade, MD;  Location: [facility];  Service: Endoscopy;;  apc rectal proctitis     KIDNEY SURGERY  1982   kidney tube collapse repair   RADIOACTIVE SEED IMPLANT     Prostate   SVT ABLATION N/A 12/12/2022   Procedure: SVT ABLATION;  Surgeon: Marinus Maw, MD;  Location: [facility];  Service: Cardiovascular;  Laterality: N/A;    Review of Systems  Constitutional:  Negative for chills and fever.  Respiratory:  Negative for shortness of breath.   Gastrointestinal:  Positive for abdominal pain.  Genitourinary:  Negative for dysuria.  Musculoskeletal:  Positive for back pain, joint pain and  myalgias. Negative for falls.  Neurological:  Negative for dizziness and headaches.      Objective    BP 137/76   Pulse (!) 53   Ht 5\' 7"  (1.702 m)   Wt 180 lb (81.6 kg)   SpO2 97%   BMI 28.19 kg/m   Physical Exam Vitals reviewed.  Constitutional:      General: He is not in acute distress.    Appearance: Normal appearance. He is not ill-appearing, toxic-appearing or diaphoretic.  HENT:     Head: Normocephalic.  Eyes:     General:        Right eye: No discharge.        Left eye: No discharge.     Conjunctiva/sclera: Conjunctivae normal.  Cardiovascular:     Rate and Rhythm: Normal rate.     Pulses: Normal pulses.     Heart sounds: Normal heart sounds.  Pulmonary:     Effort: Pulmonary effort is  normal. No respiratory distress.     Breath sounds: Normal breath sounds.  Musculoskeletal:        General: Normal range of motion.  Skin:    General: Skin is warm and dry.     Capillary Refill: Capillary refill takes less than 2 seconds.  Neurological:     General: No focal deficit present.     Mental Status: He is alert and oriented to person, place, and time.     Coordination: Coordination normal.     Gait: Gait normal.  Psychiatric:        Mood and Affect: Mood normal.        Behavior: Behavior normal.       Assessment & Plan:  Other chronic pain Assessment & Plan: Widespread pain, Possible Fibromyalgia Increased Gabapentin 600 mg 3 times PRN Advise to follow up with Neurology for further evaluation.      Orders: -     Gabapentin; Take 1 tablet (600 mg total) by mouth 3 (three) times daily as needed.  Dispense: 90 tablet; Refill: 2    Return if symptoms worsen or fail to improve.   Cruzita Lederer Newman Nip, FNP

## 2023-02-02 ENCOUNTER — Encounter: Payer: Self-pay | Admitting: *Deleted

## 2023-02-02 ENCOUNTER — Ambulatory Visit: Payer: Self-pay | Admitting: *Deleted

## 2023-02-02 NOTE — Patient Outreach (Signed)
  Care Coordination   Follow Up Visit Note   02/02/2023 Name: Steven [name] MRN: [id number] DOB: [birth year]  Steven [name] is a 72 y.o. year old male who sees Del Newman Nip, [place], FNP for primary care. I spoke with  Steven [name] by phone today.  What matters to the patients health and wellness today?  Effectively managing pain    Goals Addressed             This Visit's Progress    Manage Pain Effectively       Care Coordination Goals: Patient will take medications as prescribed PCP increased gabapentin from 300mg  TID to 600mg  TID, yesterday Patient will follow-up with neurologist and PCP as recommended and sooner if needed Awaiting a telephone call back from neurology office Patient will follow-up with neurosurgeon regarding results of cervical MRI done yesterday. Per patient's report, this was done through [facility].  Unable to locate MRI results through Care Everywhere Patient will schedule physical therapy as recommended by neurosurgeon Patient will await results of MRI and have physical therapy before considering chiropractic services. He said that was recommended by neurosurgeon as well. Patient will reach out to provider with any new or worsening symptoms Patient will reach out to RN Care Coordinator at [phone] with any resource or care coordination needs        SDOH assessments and interventions completed:  Yes SDOH Interventions Today    Flowsheet Row Most Recent Value  SDOH Interventions   Transportation Interventions Patient Resources (Friends/Family)  Physical Activity Interventions Patient Declined        Care Coordination Interventions:  Yes, provided  Interventions Today    Flowsheet Row Most Recent Value  Chronic Disease   Chronic disease during today's visit Other  [cervical spondylosis]  General Interventions   General Interventions Discussed/Reviewed General Interventions Discussed, General Interventions Reviewed, Doctor Visits   [Discussed cervical MRI done yesterday. unable to locate report in Care Everywhere]  Doctor Visits Discussed/Reviewed Doctor Visits Discussed, Doctor Visits Reviewed, PCP, Specialist  Steven [name] and discussed recent PCP visit]  PCP/Specialist Visits Compliance with follow-up visit  [F/u with PCP on 03/07/23, physical therapy eval on 02/27/23]  Exercise Interventions   Exercise Discussed/Reviewed Physical Activity, Exercise Discussed, Exercise Reviewed  Physical Activity Discussed/Reviewed Physical Activity Discussed, Physical Activity Reviewed  [encouraged but limited by neuropathy and other chronic pain]  Education Interventions   Education Provided Provided Education  Provided Verbal Education On When to see the doctor, Mental Health/Coping with Illness, Medication, Nutrition  Nutrition Interventions   Nutrition Discussed/Reviewed Nutrition Discussed, Nutrition Reviewed  Pharmacy Interventions   Pharmacy Dicussed/Reviewed Pharmacy Topics Discussed, Pharmacy Topics Reviewed, Medications and their functions  [Gabapentin yesterday by PCP from 300mg  TID to 600mg  TID. He has started that dose. He's awaiting a follow-up call from neurology office.]  Safety Interventions   Safety Discussed/Reviewed Safety Discussed, Safety Reviewed, Fall Risk, Home Safety       Follow up plan: Follow up call scheduled for 02/16/23    Encounter Outcome:  Pt. Visit Completed   Steven [name], BSN, RN-BC RN Care Coordinator [facility]  [facility] Direct Dial: [phone] Main #: [phone]

## 2023-02-05 ENCOUNTER — Telehealth: Payer: Self-pay

## 2023-02-05 ENCOUNTER — Ambulatory Visit: Payer: Self-pay | Admitting: *Deleted

## 2023-02-05 ENCOUNTER — Other Ambulatory Visit (HOSPITAL_COMMUNITY): Payer: Self-pay

## 2023-02-05 ENCOUNTER — Encounter (HOSPITAL_COMMUNITY): Payer: Self-pay | Admitting: "Endocrinology

## 2023-02-05 ENCOUNTER — Encounter: Payer: Self-pay | Admitting: *Deleted

## 2023-02-05 NOTE — Patient Instructions (Signed)
Visit Information  Thank you for taking time to visit with me today. Please don't hesitate to contact me if I can be of assistance to you.   Following are the goals we discussed today:   Goals Addressed               This Visit's Progress     Receive Counseling & Supportive Services for Symptoms of Anxiety. (pt-stated)   On track     Care Coordination Interventions:  Interventions Today    Flowsheet Row Most Recent Value  Chronic Disease   Chronic disease during today's visit Hypertension (HTN), Atrial Fibrillation (AFib), Other  [Uncontrolled Blood Pressure, Financial Insecurities & Anxiety]  General Interventions   General Interventions Discussed/Reviewed General Interventions Discussed, Labs, Vaccines, Doctor Visits, Health Screening, Annual Foot Exam, General Interventions Reviewed, Annual Eye Exam, Durable Medical Equipment (DME), [employer], Level of Care, Communication with  [Encouraged]  Labs Kidney Function  [Encouraged]  Vaccines COVID-19, Flu, Pneumonia, RSV, Tetanus/Pertussis/Diphtheria, Shingles  [Encouraged]  Doctor Visits Discussed/Reviewed Doctor Visits Discussed, Specialist, Doctor Visits Reviewed, Annual Wellness Visits, PCP  [Encouraged]  Health Screening Bone Density, Colonoscopy, Prostate  [Encouraged]  Durable Medical Equipment (DME) BP Cuff, Wheelchair  [Encouraged]  Wheelchair Standard  [Encouraged]  PCP/Specialist Visits Compliance with follow-up visit  [Encouraged]  Communication with RN, PCP/Specialists  [Encouraged]  Level of Care Adult Daycare, Applications, Assisted Living, Personal Care Services  [Encouraged]  Applications Medicaid, Personal Care Services  [Encouraged]  Exercise Interventions   Exercise Discussed/Reviewed Exercise Discussed, Assistive device use and maintanence, Exercise Reviewed, Physical Activity, Weight Managment  [Encouraged]  Physical Activity Discussed/Reviewed Physical Activity Discussed, Home Exercise Program (HEP),  Physical Activity Reviewed, Types of exercise  [Encouraged]  Weight Management Weight loss  [Encouraged]  Education Interventions   Education Provided Provided Therapist, sports, Provided Web-based Education, Provided Education  [Encouraged]  Provided Verbal Education On Nutrition, Mental Health/Coping with Illness, When to see the doctor, Foot Care, Eye Care, Labs, Applications, Exercise, Medication, Development worker, community, [employer] Resources  [Encouraged]  Labs Reviewed Kidney Function, Lipid Profile  [Encouraged]  Applications Medicaid, Personal Care Services  [Encouraged]  Mental Health Interventions   Mental Health Discussed/Reviewed Mental Health Discussed, Anxiety, Depression, Mental Health Reviewed, Grief and Loss, Substance Abuse, Coping Strategies, Suicide, Crisis, Other  [Domestic Violence]  Nutrition Interventions   Nutrition Discussed/Reviewed Nutrition Discussed, Adding fruits and vegetables, Increasing proteins, Decreasing fats, Decreasing salt, Nutrition Reviewed, Fluid intake, Carbohydrate meal planning, Portion sizes, Decreasing sugar intake  [Encouraged]  Pharmacy Interventions   Pharmacy Dicussed/Reviewed Pharmacy Topics Discussed, Medications and their functions, Medication Adherence, Pharmacy Topics Reviewed, Affording Medications  [Encouraged]  Safety Interventions   Safety Discussed/Reviewed Safety Discussed, Safety Reviewed, Fall Risk, Home Safety  [Encouraged]  Home Safety Assistive Devices, Need for home safety assessment, Refer for community resources  [Encouraged]  Advanced Directive Interventions   Advanced Directives Discussed/Reviewed Advanced Directives Discussed  [Encouraged]      Active Listening & Reflection Utilized.  Verbalization of Feelings Encouraged.  Emotional Support Provided. Feelings of Frustration Validated. Symptoms of Anxiety & Panic Attacks Acknowledged. Caregiver Resources Reviewed. Caregiver Support Groups Provided. Crisis Support Information,  Agencies, Services, & Resources Revisited. Problem Solving Interventions Indicated. Task-Centered Solutions Activated.   Solution-Focused Strategies Employed. Acceptance & Commitment Therapy Performed. Cognitive Behavioral Therapy Initiated. Client-Centered Therapy Introduced. CSW Collaboration with Primary Care Provider, Family Nurse Practitioner, [place] Records with [facility] Primary Care [phone]), Via Secure Chat Message in Epic, to Report Inability to Take Increased Dosage  of Gabapentin Due to Constipation.   Implementation of Deep Breathing Exercises, Relaxation Techniques, & Mindfulness Meditation Strategies Encouraged Daily. Reviewed List of Psychiatrists in [facility] & Encouraged Self-Enrollment with Psychiatrist of Interest, to Obtain Psychotropic Medication Administration & Management. Reviewed List of Therapists in [facility] & Encouraged Self-Enrollment with Therapist of Interest, to Obtain Psychotherapeutic Counseling & Supportive Services, from List Provided. Continued Review of The Following List of [employer], Nurse, adult, Resources, Occupational psychologist, to [employer] & Entertain Questions: ~ Adult Day Care Programs  ~ In-Home Care & Respite Agencies ~ Home Health Care Agencies ~ Respite Care Agencies & Facilities ~ Personal Care Services Application  ~ Development worker, international aid Agency Providers ~ 2023 Medicaid Tips ~ [employer] Application ~ [employer] Financial Assistance Application  Encouraged Careers information officer with [employer], Nurse, adult, & Resources of Interest, in An Effort to Obtain Care & Supervision in [facility] [employer]. Encouraged Completion of Medicaid Application & Submission to [facility] of Social Services [phone]), for Processing. Encouraged Completion of Application for Personal Care Services & Submission to [facility] [phone]) for Processing, Once Approved  for Medicaid, through [facility] of Social Services [phone]). Control and instrumentation engineer with CSW (# [phone]), if You Have Questions, Need Assistance, or If Additional Social Work Needs Are Identified Between Now & Our Next Scheduled Telephone [employer].      Our next appointment is by telephone on 02/20/2023 at 11:00 am.  Please call the care guide team at [phone] if you need to cancel or reschedule your appointment.   If you are experiencing a Mental Health or Behavioral Health Crisis or need someone to talk to, please call the Suicide and Crisis Lifeline: 988 call the [place] National Suicide Prevention Lifeline: 408-489-2989 or TTY: (234)727-9237 TTY (215)823-7570) to talk to a trained counselor call 1-800-273-TALK (toll free, 24 hour hotline) go to [facility] [address], [place] [phone]) call the [facility] Crisis Line: [phone] call 911  Patient verbalizes understanding of instructions and care plan provided today and agrees to view in MyChart. Active MyChart status and patient understanding of how to access instructions and care plan via MyChart confirmed with patient.     Telephone follow up appointment with care management team member scheduled for:  02/20/2023 at 11:00 am.  Danford Bad, BSW, MSW, LCSW  Licensed Clinical Social Worker  [facility]  [place]. [address] [place], [place] [address] Physical Address-[address] [place], [place] [address] Toll Free Main # [phone] Fax # [phone] Cell # [phone] [email or web address]

## 2023-02-05 NOTE — Telephone Encounter (Signed)
Pharmacy Patient Advocate Encounter   Received notification from Physician's Office that prior authorization for Emgality 120MG /ML auto-injectors (migraine)  is required/requested.   Insurance verification completed.   The patient is insured through [employer] .   Per test claim: PA submitted to [facility] via CoverMyMeds Key/confirmation #/EOC [id number] Status is pending

## 2023-02-05 NOTE — Telephone Encounter (Signed)
I called pt.  He had  worseniag body pain, tingling, aches.  I asked if neuropathy , he was not specific.  He was taking gabepentin 600mg  po tid tablets which causing constipation, this he received from hi pcp.  He started taking old capsules of 300mg  (2 caps ) which is better tolerated for him.  He mentioned fibromyalgia I relayed that we do not see pt for this diagnosis. I relayed that is rheumatology.  He then mentioned about his medication emaglity not be covered  (? Aimovig).  Stil c/o dizziness/ headaches at times.  I told him will see about the neuropathy fibromylgia issue and then get back with hi,  Dr. Delena Bali out of the office till Wednesday.  Be glad to address when she returns.

## 2023-02-05 NOTE — Telephone Encounter (Signed)
PA request has been Submitted. New Encounter created for follow up. For additional info see Pharmacy Prior Auth telephone encounter from 02/05/2023.

## 2023-02-05 NOTE — Patient Outreach (Signed)
Care Coordination   Follow Up Visit Note   02/05/2023  Name: Steven [name] MRN: [id number] DOB: [birth year]  Steven [name] is a 72 y.o. year old male who sees Del Newman Nip, [place], FNP for primary care. I spoke with [name] by phone today.  What matters to the patients health and wellness today?  Receive Counseling & Supportive Services for Symptoms of Anxiety.   Goals Addressed               This Visit's Progress     Receive Counseling & Supportive Services for Symptoms of Anxiety. (pt-stated)   On track     Care Coordination Interventions:  Interventions Today    Flowsheet Row Most Recent Value  Chronic Disease   Chronic disease during today's visit Hypertension (HTN), Atrial Fibrillation (AFib), Other  [Uncontrolled Blood Pressure, Financial Insecurities & Anxiety]  General Interventions   General Interventions Discussed/Reviewed General Interventions Discussed, Labs, Vaccines, Doctor Visits, Health Screening, Annual Foot Exam, General Interventions Reviewed, Annual Eye Exam, Durable Medical Equipment (DME), [employer], Level of Care, Communication with  [Encouraged]  Labs Kidney Function  [Encouraged]  Vaccines COVID-19, Flu, Pneumonia, RSV, Tetanus/Pertussis/Diphtheria, Shingles  [Encouraged]  Doctor Visits Discussed/Reviewed Doctor Visits Discussed, Specialist, Doctor Visits Reviewed, Annual Wellness Visits, PCP  [Encouraged]  Health Screening Bone Density, Colonoscopy, Prostate  [Encouraged]  Durable Medical Equipment (DME) BP Cuff, Wheelchair  [Encouraged]  Wheelchair Standard  [Encouraged]  PCP/Specialist Visits Compliance with follow-up visit  [Encouraged]  Communication with RN, PCP/Specialists  [Encouraged]  Level of Care Adult Daycare, Applications, Assisted Living, Personal Care Services  [Encouraged]  Applications Medicaid, Personal Care Services  [Encouraged]  Exercise Interventions   Exercise Discussed/Reviewed Exercise Discussed,  Assistive device use and maintanence, Exercise Reviewed, Physical Activity, Weight Managment  [Encouraged]  Physical Activity Discussed/Reviewed Physical Activity Discussed, Home Exercise Program (HEP), Physical Activity Reviewed, Types of exercise  [Encouraged]  Weight Management Weight loss  [Encouraged]  Education Interventions   Education Provided Provided Therapist, sports, Provided Web-based Education, Provided Education  [Encouraged]  Provided Verbal Education On Nutrition, Mental Health/Coping with Illness, When to see the doctor, Foot Care, Eye Care, Labs, Applications, Exercise, Medication, Development worker, community, [employer] Resources  [Encouraged]  Labs Reviewed Kidney Function, Lipid Profile  [Encouraged]  Applications Medicaid, Personal Care Services  [Encouraged]  Mental Health Interventions   Mental Health Discussed/Reviewed Mental Health Discussed, Anxiety, Depression, Mental Health Reviewed, Grief and Loss, Substance Abuse, Coping Strategies, Suicide, Crisis, Other  [Domestic Violence]  Nutrition Interventions   Nutrition Discussed/Reviewed Nutrition Discussed, Adding fruits and vegetables, Increasing proteins, Decreasing fats, Decreasing salt, Nutrition Reviewed, Fluid intake, Carbohydrate meal planning, Portion sizes, Decreasing sugar intake  [Encouraged]  Pharmacy Interventions   Pharmacy Dicussed/Reviewed Pharmacy Topics Discussed, Medications and their functions, Medication Adherence, Pharmacy Topics Reviewed, Affording Medications  [Encouraged]  Safety Interventions   Safety Discussed/Reviewed Safety Discussed, Safety Reviewed, Fall Risk, Home Safety  [Encouraged]  Home Safety Assistive Devices, Need for home safety assessment, Refer for community resources  [Encouraged]  Advanced Directive Interventions   Advanced Directives Discussed/Reviewed Advanced Directives Discussed  [Encouraged]      Active Listening & Reflection Utilized.  Verbalization of Feelings Encouraged.   Emotional Support Provided. Feelings of Frustration Validated. Symptoms of Anxiety & Panic Attacks Acknowledged. Caregiver Resources Reviewed. Caregiver Support Groups Provided. Crisis Support Information, Agencies, Services, & Resources Revisited. Problem Solving Interventions Indicated. Task-Centered Solutions Activated.   Solution-Focused Strategies Employed. Acceptance & Commitment Therapy Performed. Cognitive Behavioral Therapy Initiated. Client-Centered Therapy Introduced.  CSW Collaboration with Primary Care Provider, Family Nurse Practitioner, [place] Records with [facility] [phone]), Via Secure Chat Message in Epic, to Report Inability to Take Increased Dosage of Gabapentin Due to Constipation.   Implementation of Deep Breathing Exercises, Relaxation Techniques, & Mindfulness Meditation Strategies Encouraged Daily. Reviewed List of Psychiatrists in [facility] & Encouraged Self-Enrollment with Psychiatrist of Interest, to Obtain Psychotropic Medication Administration & Management. Reviewed List of Therapists in [facility] & Encouraged Self-Enrollment with Therapist of Interest, to Obtain Psychotherapeutic Counseling & Supportive Services, from List Provided. Continued Review of The Following List of [employer], Nurse, adult, Resources, Occupational psychologist, to [employer] & Entertain Questions: ~ Adult Day Care Programs  ~ In-Home Care & Respite Agencies ~ Home Health Care Agencies ~ Respite Care Agencies & Facilities ~ Personal Care Services Application  ~ Development worker, international aid Agency Providers ~ 2023 Medicaid Tips ~ [employer] Application ~ [employer] Financial Assistance Application  Encouraged Careers information officer with [employer], Nurse, adult, & Resources of Interest, in An Effort to Obtain Care & Supervision in [facility] [employer]. Encouraged Completion of Medicaid Application & Submission to [facility] of Social Services [phone]), for Processing. Encouraged Completion of Application for Personal Care Services & Submission to [facility] [phone]) for Processing, Once Approved for Medicaid, through [facility] of Social Services [phone]). Control and instrumentation engineer with CSW (# [phone]), if You Have Questions, Need Assistance, or If Additional Social Work Needs Are Identified Between Now & Our Next Scheduled Telephone [employer].      SDOH assessments and interventions completed:  Yes.  Care Coordination Interventions:  Yes, provided.   Follow up plan: Follow up call scheduled for 02/20/2023 at 11:00 am.  Encounter Outcome:  Pt. Visit Completed.   Danford Bad, BSW, MSW, LCSW  Licensed Restaurant manager, [facility] [employer] [facility]  Mailing [address], [place], [place] [address] Physical Address-[address], [place], [place] [address] Toll Free Main # [phone] Fax # [phone] Cell # [phone] [email or web address]

## 2023-02-05 NOTE — Telephone Encounter (Signed)
I called pharmacy and they said needs PA for emagality.

## 2023-02-06 NOTE — Telephone Encounter (Signed)
Pharmacy Patient Advocate Encounter  Received notification from [facility] that Prior Authorization for Emgality 120MG /ML auto-injectors (migraine) has been APPROVED from 07/24/2022 to 07/24/2023.Marland Kitchen  PA #/Case ID/Reference #: PA Case: [id number]

## 2023-02-06 NOTE — Telephone Encounter (Signed)
Phone room please call patient and let him know his PA has been approved.

## 2023-02-07 ENCOUNTER — Other Ambulatory Visit: Payer: Self-pay

## 2023-02-07 ENCOUNTER — Telehealth: Payer: Self-pay

## 2023-02-07 NOTE — Telephone Encounter (Signed)
Spoke with pt, advising him the symptoms he's describing are unlikely to be due to thyroid per Dr.Nida. Also that his vitamin B12 level in his recent labs shows it is excessive and to hold any Vitamin B12 supplement per Dr.Nida. Pt also advised to contact his PCP regarding symptoms and if they were to worsen to go to the ER. Pt voiced understanding and stated he has an appointment with his PCP on Friday.

## 2023-02-07 NOTE — Telephone Encounter (Signed)
I called pt back.  After speaking with Dr Delena Bali, concerning his sx and what his primary MD said that could be fibromyalgia, he is taking gabapentin that good to be checked out by rheumatology to r/o.  We have seen for migraines, and neuropathy.  He spoke to shooting pains. Wrist. Ankles, a lot malaise, feels tired.  On the other end Emaglity approved and he should be able to pick up at the pharmacy for prevention of migraines.  I relcommend for him to check with pcp and if they want to make referral to rheumatology if appropriate. He said ok.

## 2023-02-07 NOTE — Telephone Encounter (Signed)
Pt called stating he's experiencing "stabbing pain", stinging sensations, episodes of feeling hot and then cold and difficulty sleeping.

## 2023-02-09 ENCOUNTER — Ambulatory Visit
Admission: EM | Admit: 2023-02-09 | Discharge: 2023-02-09 | Disposition: A | Discharge status: Home / Self Care | Payer: Medicare HMO

## 2023-02-09 DIAGNOSIS — M792 Neuralgia and neuritis, unspecified: Secondary | ICD-10-CM | POA: Diagnosis not present

## 2023-02-09 NOTE — ED Triage Notes (Signed)
Pt c/o severe body aches that have been going on for a while but in the last month has gotten worse, and more frequent.

## 2023-02-09 NOTE — Discharge Instructions (Signed)
Continue your current medication. As discussed, please follow-up with your primary care physician to discuss further options for treating your pain.  You can talk to her about the medication, Cymbalta, and also discussed that there are other options for your sleep medicine. If symptoms worsen or become more severe, please go to the emergency department immediately for further evaluation. Follow-up as needed.

## 2023-02-09 NOTE — ED Provider Notes (Signed)
[facility]    CSN: [id number] Arrival date & time: 02/09/23  1528      History   Chief Complaint No chief complaint on file.   HPI Steven [name] is a 72 y.o. male.   The history is provided by the patient.   The patient presents for complaints of generalized body pain.  Patient states that symptoms started mostly at night.  He states that the pain is noticeable in his ankles and feet.  He also states that he can have the pain in his arms, legs, and head.  He states that he can feel the pain in his knee, and the pain will radiate into his head.  He states this has been going on for quite some time, but has been worsening over the past month.  He states that he currently takes gabapentin 600 mg, which was recently increased by his PCP as he discussed the same symptoms with her.  Patient denies injury, trauma, fall, joint swelling, fever, chills, chest pain, abdominal pain, nausea, vomiting, or diarrhea.  Patient states that the pain can be described as aching, numbness, burning, stabbing, and stinging.    Past Medical History:  Diagnosis Date   Anxiety    Arthritis    Atrial fibrillation (HCC)    Atrial flutter (HCC)    BPH (benign prostatic hyperplasia)    Chest pain    Dizziness    Dyspnea    laying down occ   Fracture 08/17/2015   MULTIPLE RIB FRACTURES     FROM FALL    GERD (gastroesophageal reflux disease)    Hemorrhoids    History of kidney stones    noted on CT scan   History of radiation therapy 08/22/11-10/13/11   prostate   Hyperlipemia    Hypertension    Hyperthyroidism    IBS (irritable bowel syndrome)    Insomnia    Light headedness    Migraine    Numbness and tingling in left arm    Numbness and tingling of both legs    Open fracture of left elbow 08/18/2015   Prostate cancer [facility])    prostate s/p radiation Mar 2013   Rib fractures 08/17/2015   Tinnitus     Patient Active Problem List   Diagnosis Date Noted   Hypertensive urgency  11/24/2022   Leukopenia 11/24/2022   SVT (supraventricular tachycardia) 11/24/2022   Orthostatic hypotension 11/23/2022   Chest tightness 08/22/2022   Hypokalemia 08/21/2022   Calculus of gallbladder without cholecystitis without obstruction 08/09/2022   Secondary adrenocortical insufficiency (HCC) 07/19/2022   Rash and nonspecific skin eruption 05/10/2022   Dysphagia 05/10/2022   Chronic pain 07/07/2020   Paresthesia of upper limb 03/04/2020   Carcinoma of prostate (HCC) 02/17/2020   Subacute thyroiditis 01/07/2020   Personal history of malignant neoplasm of prostate 12/12/2019   Benign prostatic hyperplasia with urinary obstruction 12/12/2019   Weak urinary stream 12/12/2019   Hyperthyroidism 11/24/2019   Unintentional weight loss 10/21/2019   IBS (irritable bowel syndrome) 10/21/2019   Dizzy spells 09/20/2019   Intractable chronic migraine without aura 09/20/2019   Obstructive sleep apnea syndrome 09/20/2019   Psychophysiologic insomnia 09/20/2019   Rectal pain 08/21/2019   Nausea without vomiting 05/30/2019   Paroxysmal atrial flutter (HCC) 04/17/2019   Chronic atrial fibrillation (HCC) 03/02/2019   HTN (hypertension) 03/02/2019   Constipation 08/02/2017   Hemorrhoids 08/02/2017   Fall 08/18/2015   Radiation proctitis 01/30/2013   Rectal bleeding 12/10/2012   Obese 05/30/2012  Dyspnea 05/30/2012   Elevated lipids 05/30/2012   Palpitations 05/30/2012   Malignant neoplasm of prostate (HCC) 08/13/2011    Past Surgical History:  Procedure Laterality Date   BOTOX INJECTION N/A 01/14/2020   Procedure: INJECTION OF BOTOX INTO ANAL SPHINCTER;  Surgeon: Andria Meuse, MD;  Location: [facility];  Service: General;  Laterality: N/A;   COLONOSCOPY N/A 12/19/2012   XBJ:[id number] 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 Normal appearing colon   COLONOSCOPY WITH PROPOFOL N/A 10/23/2019   Procedure: COLONOSCOPY WITH  PROPOFOL;  Surgeon: Corbin Ade, MD; external and grade 2 internal hemorrhoids, abnormal rectal blood vessels consistent with radiation proctitis s/p APC therapy, otherwise normal exam.   EVALUATION UNDER ANESTHESIA WITH ANAL FISTULECTOMY N/A 01/14/2020   Procedure: ANORECTAL EXAM UNDER ANESTHESIA;  Surgeon: Andria Meuse, MD;  Location: [facility];  Service: General;  Laterality: N/A;   HOT HEMOSTASIS  10/23/2019   Procedure: HOT HEMOSTASIS (ARGON PLASMA COAGULATION/BICAP);  Surgeon: Corbin Ade, MD;  Location: [facility];  Service: Endoscopy;;  apc rectal proctitis     KIDNEY SURGERY  1982   kidney tube collapse repair   RADIOACTIVE SEED IMPLANT     Prostate   SVT ABLATION N/A 12/12/2022   Procedure: SVT ABLATION;  Surgeon: Marinus Maw, MD;  Location: [facility];  Service: Cardiovascular;  Laterality: N/A;       Home Medications    Prior to Admission medications   Medication Sig Start Date End Date Taking? Authorizing Provider  acetaminophen (TYLENOL) 500 MG tablet Take 500 mg by mouth every 6 (six) hours as needed for moderate pain.    [provider]  alfuzosin (UROXATRAL) 10 MG 24 hr tablet Take 1 tablet (10 mg total) by mouth daily with breakfast. 04/07/22   [name], Mardene Celeste, MD  amLODipine (NORVASC) 5 MG tablet Take 1 tablet (5 mg total) by mouth daily. 01/12/23 04/12/23  Strader, Lennart Pall, PA-C  Azelastine-Fluticasone 137-50 MCG/ACT SUSP Place 1 spray into the nose every 12 (twelve) hours. 01/24/23   Del Nigel Berthold, FNP  Cholecalciferol (VITAMIN D3) 125 MCG (5000 UT) CAPS Take 1 capsule by mouth daily.     [provider]  cloNIDine (CATAPRES) 0.1 MG tablet Take 1 tablet (0.1 mg total) by mouth 2 (two) times daily as needed (SBP>160). Patient taking differently: Take 0.1 mg by mouth 3 (three) times daily. 11/27/22   Sherryll Burger, Pratik D, DO  ELIQUIS 5 MG TABS tablet TAKE 1 TABLET(5 MG) BY MOUTH TWICE DAILY Patient taking differently: Take  5 mg by mouth 2 (two) times daily. 10/18/22   Wendall Stade, MD  furosemide (LASIX) 20 MG tablet Take 1 tablet (20 mg total) by mouth daily. Take with or after meal. 11/07/22   Marinus Maw, MD  gabapentin (NEURONTIN) 600 MG tablet Take 1 tablet (600 mg total) by mouth 3 (three) times daily as needed. 02/01/23   Del Orbe Polanco, Tenna Child, FNP  Galcanezumab-gnlm (EMGALITY) 120 MG/ML SOAJ Inject 1 Pen into the skin every 30 (thirty) days. 09/25/22   Ocie Doyne, MD  hydrOXYzine (ATARAX) 10 MG tablet Take 1 tablet (10 mg total) by mouth 3 (three) times daily as needed for anxiety. 11/26/22   Sherryll Burger, Pratik D, DO  hyoscyamine (LEVBID) 0.375 MG 12 hr tablet Take 0.375 mg by mouth 2 (two) times daily. 01/24/23   [provider]  loratadine (CLARITIN) 10 MG tablet Take 1 tablet (  10 mg total) by mouth daily. 01/24/23   Del Nigel Berthold, FNP  losartan (COZAAR) 100 MG tablet Take 100 mg by mouth daily.    [provider]  meloxicam (MOBIC) 7.5 MG tablet Take 7.5 mg by mouth 2 (two) times daily as needed. 12/20/22   [provider]  methimazole (TAPAZOLE) 5 MG tablet Take 1 tablet (5 mg total) by mouth daily. 01/16/23   Roma Kayser, MD  metoprolol tartrate (LOPRESSOR) 25 MG tablet Take 1 tablet (25 mg total) by mouth 2 (two) times daily. TAKE EXTRA HALF TABLET  AS NEEDED 12/22/22 01/21/23  Sharlene Dory, PA-C  mirtazapine (REMERON) 7.5 MG tablet Take 1 tablet (7.5 mg total) by mouth at bedtime. 01/24/23   Del Orbe Polanco, Iliana, FNP  NURTEC 75 MG TBDP Take 75 mg by mouth daily as needed (pain). 08/14/22   [provider]  ondansetron (ZOFRAN-ODT) 4 MG disintegrating tablet Take 1 tablet (4 mg total) by mouth every 8 (eight) hours as needed for nausea or vomiting. 08/23/22   Vassie Loll, MD  Ondansetron HCl (ZOFRAN PO) Take by mouth as needed.    [provider]  polyethylene glycol (MIRALAX / GLYCOLAX) 17 g packet Take 17 g by mouth daily. 08/23/22   Vassie Loll, MD  potassium chloride SA (KLOR-CON M) 20 MEQ tablet Take 1 tablet (20 mEq total) by mouth daily. 12/04/22   Jacalyn Lefevre, MD  Probiotic Product (PROBIOTIC-10 PO) Take 1 capsule by mouth daily.    [provider]  Ubrogepant (UBRELVY) 100 MG TABS Take 1 tablet (100 mg total) by mouth as needed (for headaches). May repeat a dose in 2 hours if needed. Max dose 2 pills in 24 hours 09/25/22   Ocie Doyne, MD  vitamin B-12 (CYANOCOBALAMIN) 100 MCG tablet Take 100 mcg by mouth daily.    [provider]    Family History Family History  Problem Relation Age of Onset   Aneurysm Mother        aortic   Hypertension Mother    Headache Mother    Prostate cancer Father    Hypertension Father    Colon cancer Neg Hx    Colon polyps Neg Hx     Social History Social History   Tobacco Use   Smoking status: Former    Current packs/day: 0.00    Average packs/day: 1 pack/day for 20.0 years (20.0 ttl pk-yrs)    Types: Cigarettes    Start date: 2000    Quit date: 2020    Years since quitting: 4.5    Passive exposure: Past   Smokeless tobacco: Never   Tobacco comments:    Quit smoking x 2 years    07/2015   SOMETIIMES i USE VAPOR     Smoking Cessation Classes, Agencies, Services & Resources Offered  Vaping Use   Vaping status: Never Used  Substance Use Topics   Alcohol use: Never   Drug use: Never     Allergies   Latex and Meclizine   Review of Systems Review of Systems Per HPI  Physical Exam Triage Vital Signs ED Triage Vitals [02/09/23 1639]  Encounter Vitals Group     BP 137/75     Systolic BP Percentile      Diastolic BP Percentile      Pulse Rate (!) 59     Resp 13     Temp 98.4 F (36.9 C)     Temp Source Oral  SpO2 96 %     Weight      Height      Head Circumference      Peak Flow      Pain Score 8     Pain Loc      Pain Education      Exclude from Growth Chart    No data found.  Updated Vital Signs BP 137/75 (BP Location:  Right Arm)   Pulse (!) 59   Temp 98.4 F (36.9 C) (Oral)   Resp 13   SpO2 96%   Visual Acuity Right Eye Distance:   Left Eye Distance:   Bilateral Distance:    Right Eye Near:   Left Eye Near:    Bilateral Near:     Physical Exam Vitals and nursing note reviewed.  Constitutional:      General: He is not in acute distress.    Appearance: Normal appearance.  HENT:     Head: Normocephalic.  Eyes:     Extraocular Movements: Extraocular movements intact.     Pupils: Pupils are equal, round, and reactive to light.  Cardiovascular:     Rate and Rhythm: Regular rhythm. Bradycardia present.     Pulses: Normal pulses.     Heart sounds: Normal heart sounds.  Pulmonary:     Effort: Pulmonary effort is normal. No respiratory distress.     Breath sounds: Normal breath sounds. No stridor. No wheezing, rhonchi or rales.  Abdominal:     General: Bowel sounds are normal.     Palpations: Abdomen is soft.     Tenderness: There is no abdominal tenderness.  Musculoskeletal:     Cervical back: Normal range of motion.  Skin:    General: Skin is warm and dry.  Neurological:     General: No focal deficit present.     Mental Status: He is alert and oriented to person, place, and time.  Psychiatric:        Mood and Affect: Mood normal.        Behavior: Behavior normal.      UC Treatments / Results  Labs (all labs ordered are listed, but only abnormal results are displayed) Labs Reviewed - No data to display  EKG   Radiology No results found.  Procedures Procedures (including critical care time)  Medications Ordered in UC Medications - No data to display  Initial Impression / Assessment and Plan / UC Course  I have reviewed the triage vital signs and the nursing notes.  Pertinent labs & imaging results that were available during my care of the patient were reviewed by me and considered in my medical decision making (see chart for details).  The patient is well-appearing,  he is in no acute distress, vital signs are stable.  Patient presents with chronic neuropathic pain.  Patient is currently taking gabapentin 600 mg that was prescribed by his PCP.  Patient states pain has worsened over the last several weeks.  Given the patient's chronic symptoms, and worsening, patient was advised to follow-up with his PCP for further evaluation and workup.  Patient was in agreement with this plan of care and verbalized understanding.  All questions were answered.  Patient stable for discharge.   Final Clinical Impressions(s) / UC Diagnoses   Final diagnoses:  Neuropathic pain     Discharge Instructions      Continue your current medication. As discussed, please follow-up with your primary care physician to discuss further options for treating your pain.  You can talk to her about the medication, Cymbalta, and also discussed that there are other options for your sleep medicine. If symptoms worsen or become more severe, please go to the emergency department immediately for further evaluation. Follow-up as needed.     ED Prescriptions   None    PDMP not reviewed this encounter.   Abran Cantor, NP 02/09/23 1733

## 2023-02-12 DIAGNOSIS — Z6829 Body mass index (BMI) 29.0-29.9, adult: Secondary | ICD-10-CM | POA: Diagnosis not present

## 2023-02-12 DIAGNOSIS — M47812 Spondylosis without myelopathy or radiculopathy, cervical region: Secondary | ICD-10-CM | POA: Diagnosis not present

## 2023-02-16 ENCOUNTER — Ambulatory Visit: Payer: Self-pay | Admitting: *Deleted

## 2023-02-16 ENCOUNTER — Encounter: Payer: Self-pay | Admitting: *Deleted

## 2023-02-16 NOTE — Patient Outreach (Signed)
  Care Coordination   Follow Up Visit Note   02/16/2023 Name: Steven [name] MRN: [id number] DOB: [birth year]  Steven [name] is a 72 y.o. year old male who sees Del Newman Nip, [place], FNP for primary care. I spoke with  [name] by phone today.  What matters to the patients health and wellness today? Improvement in pain symptoms    Goals Addressed             This Visit's Progress    Manage Pain Effectively   Not on track    Care Coordination Goals: Patient will take medications as prescribed PCP increased gabapentin from 300mg  TID to 600mg  TID recently Patient will follow-up with neurologist and PCP as recommended and sooner if needed Scheduled for 05/28/23 Patient will follow-up with neurosurgeon regarding results of cervical MRI. Per patient's report, this was done through [place].  Patient will work with physical therapy starting on 02/27/23 at [facility] Patient will follow-up with PCP on 03/07/23 and discuss urgent care recommendation for adding Cymbalta to help with pain management. Will need to stop Remeron if starts Cymbalta.  Patient will reach out to provider with any new or worsening symptoms Patient will reach out to RN Care Coordinator at [phone] with any resource or care coordination needs        SDOH assessments and interventions completed:  Yes  SDOH Interventions Today    Flowsheet Row Most Recent Value  SDOH Interventions   Transportation Interventions Patient Resources (Friends/Family)  Physical Activity Interventions Patient Declined        Care Coordination Interventions:  Yes, provided  Interventions Today    Flowsheet Row Most Recent Value  Chronic Disease   Chronic disease during today's visit Other  [Chronic neuropathic pain]  General Interventions   General Interventions Discussed/Reviewed General Interventions Discussed, General Interventions Reviewed, Doctor Visits, Communication with  Doctor Visits Discussed/Reviewed Doctor  Visits Discussed, Doctor Visits Reviewed, PCP, Specialist  [Urgent care visit on 02/07/23 for neuropathic pain]  PCP/Specialist Visits Compliance with follow-up visit  [PCP scheduled for 8/14. Working to reschedule sooner if possible.]  Communication with --  Steven [name] guide re: scheduling f/u with PCP]  Exercise Interventions   Exercise Discussed/Reviewed Physical Activity  Physical Activity Discussed/Reviewed Physical Activity Discussed, Physical Activity Reviewed  [starting physical therapy on 02/27/23 at [facility]]  Education Interventions   Education Provided Provided Education  Provided Verbal Education On Exercise, Medication, When to see the doctor  Macy Mis out to PCP or go to urgent care for new or worsening symptoms. keep PT appt. Follow-up with neurologist. Advised against chiropractor at this time due to unknown cause of pain]  Mental Health Interventions   Mental Health Discussed/Reviewed Mental Health Discussed, Mental Health Reviewed  [Has a follow-up scheduled with LCSW on 02/20/23]  Pharmacy Interventions   Pharmacy Dicussed/Reviewed Pharmacy Topics Discussed, Pharmacy Topics Reviewed, Medications and their functions  [discussed recomendation from urgent care vist to add cymbalta for pain managment, will help with anxiety/depression as well, and to change remeron if cymbalta is added.]  Safety Interventions   Safety Discussed/Reviewed Safety Discussed, Safety Reviewed, Fall Risk, Home Safety       Follow up plan: Follow up call scheduled for 03/12/23    Encounter Outcome:  Pt. Visit Completed   Demetrios Loll, BSN, RN-BC RN Care Coordinator [facility]  [facility] Direct Dial: [phone] Main #: [phone]

## 2023-02-19 ENCOUNTER — Encounter: Payer: Self-pay | Admitting: Family Medicine

## 2023-02-19 ENCOUNTER — Ambulatory Visit (INDEPENDENT_AMBULATORY_CARE_PROVIDER_SITE_OTHER): Payer: Medicare HMO | Admitting: Family Medicine

## 2023-02-19 VITALS — BP 134/70 | HR 66 | Ht 67.0 in | Wt 189.0 lb

## 2023-02-19 DIAGNOSIS — R52 Pain, unspecified: Secondary | ICD-10-CM | POA: Insufficient documentation

## 2023-02-19 MED ORDER — GABAPENTIN 300 MG PO CAPS
300.0000 mg | ORAL_CAPSULE | Freq: Three times a day (TID) | ORAL | 3 refills | Status: DC | PRN
Start: 2023-02-19 — End: 2023-03-09

## 2023-02-19 MED ORDER — DULOXETINE HCL 20 MG PO CPEP: 20.0000 mg | ORAL_CAPSULE | Freq: Every day | ORAL | 3 refills | Status: DC

## 2023-02-19 NOTE — Assessment & Plan Note (Addendum)
Possible Fibromyalgia Continue Gabapentin 300 mg PRN Labs ordered ESR, RF, CCP Referral placed to Rheumatology for further evaluation  Advise Heat and Cold Therapy: Apply heat pads or cold packs to sore muscles to relieve pain and stiffness. Low-Impact Activities: Engage in low-impact exercises like walking, swimming, or cycling to improve overall fitness and reduce symptoms.

## 2023-02-19 NOTE — Patient Instructions (Signed)

## 2023-02-19 NOTE — Progress Notes (Signed)
Patient Office Visit   Subjective   Patient ID: Steven [name], male    DOB: [birth year]  Age: 72 y.o. MRN: [id number]  CC:  Chief Complaint  Patient presents with   Pain    Patient complains of continued stabbing pains all over body.     HPI Steven [name] 72 year old male, presents to the clinic for continued stabbing pains all over his body. He  has a past medical history of Anxiety, Arthritis, Atrial fibrillation (HCC), Atrial flutter (HCC), BPH (benign prostatic hyperplasia), Chest pain, Dizziness, Dyspnea, Fracture (08/17/2015), GERD (gastroesophageal reflux disease), Hemorrhoids, History of kidney stones, History of radiation therapy (08/22/11-10/13/11), Hyperlipemia, Hypertension, Hyperthyroidism, IBS (irritable bowel syndrome), Insomnia, Light headedness, Migraine, Numbness and tingling in left arm, Numbness and tingling of both legs, Open fracture of left elbow (08/18/2015), Prostate cancer (HCC), Rib fractures (08/17/2015), and Tinnitus.  This is a recurrent problem for the past 3 months. Pain occurs constantly and has been gradually worsening since onset. The context of the pain is unknown. Pain location: diffuse all over, quality of pain stabbing, burning, tingling. Patient reports starts from his ankle all the way to his head. . The symptoms are aggravated by inactivity. Associated symptoms include abdominal pain, eye pain, fatigue, headaches, stiffness and weakness. Pertinent negatives include no rash. Past treatments include rest and gabapentin. The treatment provided mild to moderate relief. There is no swelling present. He has been behaving normally. His past medical history is significant for chronic back pain.        Outpatient Encounter Medications as of 02/19/2023  Medication Sig   acetaminophen (TYLENOL) 500 MG tablet Take 500 mg by mouth every 6 (six) hours as needed for moderate pain.   alfuzosin (UROXATRAL) 10 MG 24 hr tablet Take 1 tablet (10 mg total) by mouth  daily with breakfast.   amLODipine (NORVASC) 5 MG tablet Take 1 tablet (5 mg total) by mouth daily.   Azelastine-Fluticasone 137-50 MCG/ACT SUSP Place 1 spray into the nose every 12 (twelve) hours.   Cholecalciferol (VITAMIN D3) 125 MCG (5000 UT) CAPS Take 1 capsule by mouth daily.    cloNIDine (CATAPRES) 0.1 MG tablet Take 1 tablet (0.1 mg total) by mouth 2 (two) times daily as needed (SBP>160). (Patient taking differently: Take 0.1 mg by mouth 3 (three) times daily.)   ELIQUIS 5 MG TABS tablet TAKE 1 TABLET(5 MG) BY MOUTH TWICE DAILY (Patient taking differently: Take 5 mg by mouth 2 (two) times daily.)   furosemide (LASIX) 20 MG tablet Take 1 tablet (20 mg total) by mouth daily. Take with or after meal.   gabapentin (NEURONTIN) 300 MG capsule Take 1 capsule (300 mg total) by mouth 3 (three) times daily as needed.   Galcanezumab-gnlm (EMGALITY) 120 MG/ML SOAJ Inject 1 Pen into the skin every 30 (thirty) days.   hydrOXYzine (ATARAX) 10 MG tablet Take 1 tablet (10 mg total) by mouth 3 (three) times daily as needed for anxiety.   hyoscyamine (LEVBID) 0.375 MG 12 hr tablet Take 0.375 mg by mouth 2 (two) times daily.   loratadine (CLARITIN) 10 MG tablet Take 1 tablet (10 mg total) by mouth daily.   losartan (COZAAR) 100 MG tablet Take 100 mg by mouth daily.   methimazole (TAPAZOLE) 5 MG tablet Take 1 tablet (5 mg total) by mouth daily.   mirtazapine (REMERON) 7.5 MG tablet Take 1 tablet (7.5 mg total) by mouth at bedtime.   NURTEC 75 MG TBDP Take 75 mg by mouth  daily as needed (pain).   ondansetron (ZOFRAN-ODT) 4 MG disintegrating tablet Take 1 tablet (4 mg total) by mouth every 8 (eight) hours as needed for nausea or vomiting.   Ondansetron HCl (ZOFRAN PO) Take by mouth as needed.   polyethylene glycol (MIRALAX / GLYCOLAX) 17 g packet Take 17 g by mouth daily.   potassium chloride SA (KLOR-CON M) 20 MEQ tablet Take 1 tablet (20 mEq total) by mouth daily.   Probiotic Product (PROBIOTIC-10 PO) Take 1  capsule by mouth daily.   Ubrogepant (UBRELVY) 100 MG TABS Take 1 tablet (100 mg total) by mouth as needed (for headaches). May repeat a dose in 2 hours if needed. Max dose 2 pills in 24 hours   vitamin B-12 (CYANOCOBALAMIN) 100 MCG tablet Take 100 mcg by mouth daily.   [DISCONTINUED] DULoxetine (CYMBALTA) 20 MG capsule Take 1 capsule (20 mg total) by mouth daily.   [DISCONTINUED] gabapentin (NEURONTIN) 600 MG tablet Take 1 tablet (600 mg total) by mouth 3 (three) times daily as needed.   [DISCONTINUED] meloxicam (MOBIC) 7.5 MG tablet Take 7.5 mg by mouth 2 (two) times daily as needed.   metoprolol tartrate (LOPRESSOR) 25 MG tablet Take 1 tablet (25 mg total) by mouth 2 (two) times daily. TAKE EXTRA HALF TABLET  AS NEEDED   No facility-administered encounter medications on file as of 02/19/2023.    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 Normal appearing colon   COLONOSCOPY WITH PROPOFOL N/A 10/23/2019   Procedure: COLONOSCOPY WITH PROPOFOL;  Surgeon: Corbin Ade, MD; external and grade 2 internal hemorrhoids, abnormal rectal blood vessels consistent with radiation proctitis s/p APC therapy, otherwise normal exam.   EVALUATION UNDER ANESTHESIA WITH ANAL FISTULECTOMY N/A 01/14/2020   Procedure: ANORECTAL EXAM UNDER ANESTHESIA;  Surgeon: Andria Meuse, MD;  Location: [facility];  Service: General;  Laterality: N/A;   HOT HEMOSTASIS  10/23/2019   Procedure: HOT HEMOSTASIS (ARGON PLASMA COAGULATION/BICAP);  Surgeon: Corbin Ade, MD;  Location: [facility];  Service: Endoscopy;;  apc rectal proctitis     KIDNEY SURGERY  1982   kidney tube collapse repair    RADIOACTIVE SEED IMPLANT     Prostate   SVT ABLATION N/A 12/12/2022   Procedure: SVT ABLATION;  Surgeon: Marinus Maw, MD;  Location: [facility] INVASIVE CV LAB;  Service: Cardiovascular;  Laterality: N/A;    Review of Systems  Constitutional:  Negative for chills and fever.  Eyes:  Negative for blurred vision.  Respiratory:  Negative for shortness of breath.   Cardiovascular:  Negative for palpitations.  Musculoskeletal:  Positive for back pain, joint pain, myalgias and neck pain.  Neurological:  Negative for dizziness.      Objective    BP 134/70   Pulse 66   Ht 5\' 7"  (1.702 m)   Wt 189 lb (85.7 kg)   SpO2 95%   BMI 29.60 kg/m   Physical Exam Vitals reviewed.  Constitutional:      General: He is not in acute distress.    Appearance: Normal appearance. He is not ill-appearing, toxic-appearing or diaphoretic.  HENT:     Head: Normocephalic.  Eyes:     General:  Right eye: No discharge.        Left eye: No discharge.     Conjunctiva/sclera: Conjunctivae normal.  Cardiovascular:     Rate and Rhythm: Normal rate.     Pulses: Normal pulses.     Heart sounds: Normal heart sounds.  Pulmonary:     Effort: Pulmonary effort is normal. No respiratory distress.     Breath sounds: Normal breath sounds.  Musculoskeletal:     Cervical back: Normal range of motion.     Right lower leg: No edema.     Left lower leg: No edema.  Skin:    General: Skin is warm and dry.     Capillary Refill: Capillary refill takes less than 2 seconds.  Neurological:     General: No focal deficit present.     Mental Status: He is alert and oriented to person, place, and time.     Coordination: Coordination normal.     Gait: Gait normal.  Psychiatric:        Mood and Affect: Mood normal.       Assessment & Plan:  Diffuse pain Assessment & Plan: Possible Fibromyalgia Continue Gabapentin 300 mg PRN Labs ordered ESR, RF, CCP Referral placed to Rheumatology for further evaluation  Advise  Heat and Cold Therapy: Apply heat pads or cold packs to sore muscles to relieve pain and stiffness. Low-Impact Activities: Engage in low-impact exercises like walking, swimming, or cycling to improve overall fitness and reduce symptoms.  Orders: -     Ambulatory referral to Rheumatology -     Sedimentation rate -     CYCLIC CITRUL PEPTIDE ANTIBODY, IGG/IGA -     Rheumatoid factor -     Gabapentin; Take 1 capsule (300 mg total) by mouth 3 (three) times daily as needed.  Dispense: 90 capsule; Refill: 3    Return in 4 months (on 06/22/2023), or if symptoms worsen or fail to improve, for chronic follow-up, hypertension.   Cruzita Lederer Newman Nip, FNP

## 2023-02-20 ENCOUNTER — Encounter: Payer: Self-pay | Admitting: *Deleted

## 2023-02-20 ENCOUNTER — Ambulatory Visit: Payer: Self-pay | Admitting: *Deleted

## 2023-02-20 NOTE — Patient Outreach (Signed)
Care Coordination   Follow Up Visit Note   02/20/2023  Name: FLAVIL MCMORRAN MRN: [id number] DOB: 06-28-1951  JUDITH KEMPE is a 72 y.o. year old male who sees Del Newman Nip, [place], FNP for primary care. I spoke with [name] by phone today.  What matters to the patients health and wellness today?  Receive Counseling & Supportive Services for Symptoms of Anxiety.   Goals Addressed               This Visit's Progress     Receive Counseling & Supportive Services for Symptoms of Anxiety. (pt-stated)   On track     Care Coordination Interventions:  Interventions Today    Flowsheet Row Most Recent Value  Chronic Disease   Chronic disease during today's visit Hypertension (HTN), Atrial Fibrillation (AFib), Other  [Uncontrolled Blood Pressure, Financial Insecurities & Anxiety]  General Interventions   General Interventions Discussed/Reviewed General Interventions Discussed, Labs, Vaccines, Doctor Visits, Health Screening, Annual Foot Exam, General Interventions Reviewed, Annual Eye Exam, Durable Medical Equipment (DME), [employer], Level of Care, Communication with  [Encouraged]  Labs Kidney Function  [Encouraged]  Vaccines COVID-19, Flu, Pneumonia, RSV, Tetanus/Pertussis/Diphtheria, Shingles  [Encouraged]  Doctor Visits Discussed/Reviewed Doctor Visits Discussed, Specialist, Doctor Visits Reviewed, Annual Wellness Visits, PCP  [Encouraged]  Health Screening Bone Density, Colonoscopy, Prostate  [Encouraged]  Durable Medical Equipment (DME) BP Cuff, Wheelchair  [Encouraged]  Wheelchair Standard  [Encouraged]  PCP/Specialist Visits Compliance with follow-up visit  [Encouraged]  Communication with RN, PCP/Specialists  [Encouraged]  Level of Care Adult Daycare, Applications, Assisted Living, Personal Care Services  [Encouraged]  Applications Medicaid, Personal Care Services  [Encouraged]  Exercise Interventions   Exercise Discussed/Reviewed Exercise Discussed,  Assistive device use and maintanence, Exercise Reviewed, Physical Activity, Weight Managment  [Encouraged]  Physical Activity Discussed/Reviewed Physical Activity Discussed, Home Exercise Program (HEP), Physical Activity Reviewed, Types of exercise  [Encouraged]  Weight Management Weight loss  [Encouraged]  Education Interventions   Education Provided Provided Therapist, sports, Provided Web-based Education, Provided Education  [Encouraged]  Provided Verbal Education On Nutrition, Mental Health/Coping with Illness, When to see the doctor, Foot Care, Eye Care, Labs, Applications, Exercise, Medication, Development worker, community, [employer]  [Encouraged]  Labs Reviewed Kidney Function, Lipid Profile  [Encouraged]  Applications Medicaid, Personal Care Services  [Encouraged]  Mental Health Interventions   Mental Health Discussed/Reviewed Mental Health Discussed, Anxiety, Depression, Mental Health Reviewed, Grief and Loss, Substance Abuse, Coping Strategies, Suicide, Crisis, Other  [Domestic Violence]  Nutrition Interventions   Nutrition Discussed/Reviewed Nutrition Discussed, Adding fruits and vegetables, Increasing proteins, Decreasing fats, Decreasing salt, Nutrition Reviewed, Fluid intake, Carbohydrate meal planning, Portion sizes, Decreasing sugar intake  [Encouraged]  Pharmacy Interventions   Pharmacy Dicussed/Reviewed Pharmacy Topics Discussed, Medications and their functions, Medication Adherence, Pharmacy Topics Reviewed, Affording Medications  [Encouraged]  Safety Interventions   Safety Discussed/Reviewed Safety Discussed, Safety Reviewed, Fall Risk, Home Safety  [Encouraged]  Home Safety Assistive Devices, Need for home safety assessment, Refer for community resources  [Encouraged]  Advanced Directive Interventions   Advanced Directives Discussed/Reviewed Advanced Directives Discussed  [Encouraged]      Active Listening & Reflection Utilized.  Verbalization of Feelings Encouraged.   Emotional Support Provided. Feelings of Frustration Validated. Symptoms of Anxiety & Panic Attacks Acknowledged. Problem Solving Interventions Employed. Task-Centered Solutions Activated.   Solution-Focused Strategies Enacted. Acceptance & Commitment Therapy Performed. Cognitive Behavioral Therapy Initiated. Encouraged Increased Level of Low-Impact Activities & Exercise, Like Walking, Swimming, Cycling, Etc., to Improve Overall Fitness &  Reduce Chronic Pain Symptoms. Encouraged Engagement with Physical Therapist, [place] Organ with [facility], Beginning on 02/27/2023 at 4:00 PM. Encouraged Administration of Medications, Exactly as Prescribed. Encouraged Continued Implementation of Deep Breathing Exercises, Relaxation Techniques, & Mindfulness Meditation Strategies Daily. Encouraged Self-Enrollment with Psychiatrist of Interest in [facility], from List Provided, in An Effort to Receive Psychotropic Medication Administration & Management, to Reduce & Manage Symptoms of Anxiety. Encouraged Self-Enrollment with Therapist of Interest in [facility], from List Provided, in An Effort to Receive Psychotherapeutic Counseling & Supportive Services, to Reduce & Manage Symptoms of Anxiety. Encouraged Re-enrollment with Extra Help Program, to Receive Financial Assistance to Pay for Prescription Medications. Encouraged Completion of Medicaid Application & Submission to [facility] of Social Services [phone]) for Processing. Encouraged Completion of Application for Personal Care Services & Submission to [facility] [phone]) for Processing, Once Approved for Medicaid, through [facility] of Social Services [phone]). Encouraged Contact with CSW (# [phone]), if You Have Questions, Need Assistance, or If Additional Social Work Needs Are Identified Between Now & Our Next Scheduled  Follow-Up Outreach Call.      SDOH assessments and interventions completed:  Yes.  Care Coordination Interventions:  Yes, provided.   Follow up plan: Follow up call scheduled for 03/20/2023 at 11:00 am.  Encounter Outcome:  Pt. Visit Completed.   Danford Bad, BSW, MSW, LCSW  Licensed Restaurant manager, [employer] [facility]  Mailing [place] [address] [place], [place] [address] Physical Address-[address] [place], [place] [address] Toll Free Main # [phone] Fax # [phone] Cell # [phone] [email or web address]

## 2023-02-20 NOTE — Patient Instructions (Signed)
Visit Information  Thank you for taking time to visit with me today. Please don't hesitate to contact me if I can be of assistance to you.   Following are the goals we discussed today:   Goals Addressed               This Visit's Progress     Receive Counseling & Supportive Services for Symptoms of Anxiety. (pt-stated)   On track     Care Coordination Interventions:  Interventions Today    Flowsheet Row Most Recent Value  Chronic Disease   Chronic disease during today's visit Hypertension (HTN), Atrial Fibrillation (AFib), Other  [Uncontrolled Blood Pressure, Financial Insecurities & Anxiety]  General Interventions   General Interventions Discussed/Reviewed General Interventions Discussed, Labs, Vaccines, Doctor Visits, Health Screening, Annual Foot Exam, General Interventions Reviewed, Annual Eye Exam, Durable Medical Equipment (DME), [employer], Level of Care, Communication with  [Encouraged]  Labs Kidney Function  [Encouraged]  Vaccines COVID-19, Flu, Pneumonia, RSV, Tetanus/Pertussis/Diphtheria, Shingles  [Encouraged]  Doctor Visits Discussed/Reviewed Doctor Visits Discussed, Specialist, Doctor Visits Reviewed, Annual Wellness Visits, PCP  [Encouraged]  Health Screening Bone Density, Colonoscopy, Prostate  [Encouraged]  Durable Medical Equipment (DME) BP Cuff, Wheelchair  [Encouraged]  Wheelchair Standard  [Encouraged]  PCP/Specialist Visits Compliance with follow-up visit  [Encouraged]  Communication with RN, PCP/Specialists  [Encouraged]  Level of Care Adult Daycare, Applications, Assisted Living, Personal Care Services  [Encouraged]  Applications Medicaid, Personal Care Services  [Encouraged]  Exercise Interventions   Exercise Discussed/Reviewed Exercise Discussed, Assistive device use and maintanence, Exercise Reviewed, Physical Activity, Weight Managment  [Encouraged]  Physical Activity Discussed/Reviewed Physical Activity Discussed, Home Exercise Program (HEP),  Physical Activity Reviewed, Types of exercise  [Encouraged]  Weight Management Weight loss  [Encouraged]  Education Interventions   Education Provided Provided Therapist, sports, Provided Web-based Education, Provided Education  [Encouraged]  Provided Verbal Education On Nutrition, Mental Health/Coping with Illness, When to see the doctor, Foot Care, Eye Care, Labs, Applications, Exercise, Medication, Development worker, community, [employer]  [Encouraged]  Labs Reviewed Kidney Function, Lipid Profile  [Encouraged]  Applications Medicaid, Personal Care Services  [Encouraged]  Mental Health Interventions   Mental Health Discussed/Reviewed Mental Health Discussed, Anxiety, Depression, Mental Health Reviewed, Grief and Loss, Substance Abuse, Coping Strategies, Suicide, Crisis, Other  [Domestic Violence]  Nutrition Interventions   Nutrition Discussed/Reviewed Nutrition Discussed, Adding fruits and vegetables, Increasing proteins, Decreasing fats, Decreasing salt, Nutrition Reviewed, Fluid intake, Carbohydrate meal planning, Portion sizes, Decreasing sugar intake  [Encouraged]  Pharmacy Interventions   Pharmacy Dicussed/Reviewed Pharmacy Topics Discussed, Medications and their functions, Medication Adherence, Pharmacy Topics Reviewed, Affording Medications  [Encouraged]  Safety Interventions   Safety Discussed/Reviewed Safety Discussed, Safety Reviewed, Fall Risk, Home Safety  [Encouraged]  Home Safety Assistive Devices, Need for home safety assessment, Refer for community resources  [Encouraged]  Advanced Directive Interventions   Advanced Directives Discussed/Reviewed Advanced Directives Discussed  [Encouraged]      Active Listening & Reflection Utilized.  Verbalization of Feelings Encouraged.  Emotional Support Provided. Feelings of Frustration Validated. Symptoms of Anxiety & Panic Attacks Acknowledged. Problem Solving Interventions Employed. Task-Centered Solutions Activated.    Solution-Focused Strategies Enacted. Acceptance & Commitment Therapy Performed. Cognitive Behavioral Therapy Initiated. Encouraged Increased Level of Low-Impact Activities & Exercise, Like Walking, Swimming, Cycling, Etc., to Improve Overall Fitness & Reduce Chronic Pain Symptoms. Encouraged Engagement with Physical Therapist, [place] [facility] with [facility], Beginning on 02/27/2023 at 4:00 PM. Encouraged Administration of Medications, Exactly as Prescribed. Encouraged Continued Implementation  of Deep Breathing Exercises, Relaxation Techniques, & Mindfulness Meditation Strategies Daily. Encouraged Self-Enrollment with Psychiatrist of Interest in [facility], from List Provided, in An Effort to Receive Psychotropic Medication Administration & Management, to Reduce & Manage Symptoms of Anxiety. Encouraged Self-Enrollment with Therapist of Interest in [facility], from List Provided, in An Effort to Receive Psychotherapeutic Counseling & Supportive Services, to Reduce & Manage Symptoms of Anxiety. Encouraged Re-enrollment with Extra Help Program, to Receive Financial Assistance to Pay for Prescription Medications. Encouraged Completion of Medicaid Application & Submission to [facility] of Social Services [phone]) for Processing. Encouraged Completion of Application for Personal Care Services & Submission to [facility] [phone]) for Processing, Once Approved for Medicaid, through [facility] of Social Services [phone]). Encouraged Contact with CSW (# [phone]), if You Have Questions, Need Assistance, or If Additional Social Work Needs Are Identified Between Now & Our Next Scheduled Follow-Up Outreach Call.      Our next appointment is by telephone on 03/20/2023 at 11:00 am.  Please call the care guide team at [phone] if you need to cancel or reschedule your  appointment.   If you are experiencing a Mental Health or Behavioral Health Crisis or need someone to talk to, please call the Suicide and Crisis Lifeline: 988 call the [place] National Suicide Prevention Lifeline: 541-707-5403 or TTY: 5618627174 TTY 423-780-6832) to talk to a trained counselor call 1-800-273-TALK (toll free, 24 hour hotline) go to [facility] [address], [place] [phone]) call the [facility] Crisis Line: [phone] call 911  Patient verbalizes understanding of instructions and care plan provided today and agrees to view in MyChart. Active MyChart status and patient understanding of how to access instructions and care plan via MyChart confirmed with patient.     Telephone follow up appointment with care management team member scheduled for:  03/20/2023 at 11:00 am.  Danford Bad, BSW, MSW, LCSW  Licensed Clinical Social Worker  [facility]  Mailing [place]. [address], [place], [place] [address] Physical Address-[address], [place], [place] [address] Toll Free Main # [phone] Fax # [phone] Cell # [phone] [email or web address]

## 2023-02-27 ENCOUNTER — Ambulatory Visit (HOSPITAL_COMMUNITY): Payer: Medicare HMO | Admitting: Physical Therapy

## 2023-03-05 ENCOUNTER — Other Ambulatory Visit: Payer: Self-pay | Admitting: Cardiovascular Disease

## 2023-03-05 NOTE — Telephone Encounter (Signed)
Prescription refill request for Eliquis received. Indication:afib Last office visit:6/24 Scr:0.85  6/24 Age: 72 Weight:85.7  kg  Prescription refilled

## 2023-03-06 ENCOUNTER — Telehealth: Payer: Self-pay | Admitting: Family Medicine

## 2023-03-06 NOTE — Telephone Encounter (Signed)
Prescription Request  03/06/2023  LOV: 02/19/2023  What is the name of the medication or equipment? gabapentin (NEURONTIN) 300 MG capsule   Have you contacted your pharmacy to request a refill? No   Which pharmacy would you like this sent to?  [facility] [address]   Patient notified that their request is being sent to the clinical staff for review and that they should receive a response within 2 business days.   Please advise at Mobile [phone] (mobile)   Patient says keep saying too soon but the 600 mg block the patient up, can not take these.

## 2023-03-07 ENCOUNTER — Ambulatory Visit: Payer: Medicare HMO | Admitting: Family Medicine

## 2023-03-09 ENCOUNTER — Telehealth: Payer: Self-pay | Admitting: Family Medicine

## 2023-03-09 ENCOUNTER — Ambulatory Visit: Payer: Self-pay | Admitting: *Deleted

## 2023-03-09 MED ORDER — GABAPENTIN 300 MG PO CAPS: 300.0000 mg | ORAL_CAPSULE | Freq: Three times a day (TID) | ORAL | 0 refills | Status: DC

## 2023-03-09 NOTE — Telephone Encounter (Signed)
Patient called said his pharmacy has not sent any of his medicines to [facility] [address]    Patient saying cannot take the 600 mg Gabapentin, needs to be the 300 mg.  (NEEDS ASAP only has 4 pills left)   ondansetron (ZOFRAN-ODT) 4 MG disintegrating tablet  (no longer gets from urgent care doctor  Nexletol 180 mg  Pharmacy: [facility] [address] [place]

## 2023-03-09 NOTE — Telephone Encounter (Signed)
Spoke with pharmacy

## 2023-03-09 NOTE — Addendum Note (Signed)
Addended by: Syliva Overman E on: 03/09/2023 11:44 AM   Modules accepted: Orders

## 2023-03-09 NOTE — Patient Outreach (Signed)
  Care Coordination   Follow Up Visit Note   03/09/2023 Name: DEZMAN SCHOENING MRN: [id number] DOB: 03-Jan-1951  CARVIS MANKIEWICZ is a 72 y.o. year old male who sees Del Newman Nip, [place], FNP for primary care. I spoke with  [name] by phone today.  What matters to the patients health and wellness today?  Pt wants to know where to send his Medicaid application since he has now completed it. Pt reports he has over $20,000 in unpaid medical bills and is hoping to get approved for Medicaid.    Goals Addressed   None     SDOH assessments and interventions completed:  Yes    Care Coordination Interventions:  Yes, provided   Follow up plan: Follow up call scheduled with Mardene Celeste, assigned CSW, previously. Encounter Outcome:  Pt. Visit Completed

## 2023-03-09 NOTE — Telephone Encounter (Signed)
[facility] calling needing clarification on Gabapentin 300 mg. Please advise [phone] Thank you

## 2023-03-12 ENCOUNTER — Telehealth: Payer: Self-pay | Admitting: Cardiovascular Disease

## 2023-03-12 ENCOUNTER — Encounter: Payer: Self-pay | Admitting: *Deleted

## 2023-03-12 ENCOUNTER — Ambulatory Visit: Payer: Self-pay | Admitting: *Deleted

## 2023-03-12 NOTE — Telephone Encounter (Signed)
Pt reports getting too hot yesterday. States "humidity got to me", that the a/c in his car would not push out enough air to cool him down. "Felt like I was choking in my throat". No CP, radiating pain, edema or syncope. Pt does not think he was in afib/abn rhythm. Much improved today. Pt advised to monitor and if reoccurs to call PCP for evaluation and/or urgent care/ED. Patient verbalized understanding and agreeable to plan.

## 2023-03-12 NOTE — Telephone Encounter (Signed)
Patient is calling because yesterday he had gotten very nauseous, weak, lightheadedness, and short of breathe (Like the air was getting cut off in his throat). Patient stated he started to feel better, but he can still feel effects from this event. Patient is unsure if it was because of the heat and humidity.

## 2023-03-12 NOTE — Patient Outreach (Signed)
Care Coordination   Follow Up Visit Note   03/12/2023 Name: KARDIER FRINK MRN: [id number] DOB: 05-29-1951  Steven [name] is a 72 y.o. year old male who sees Del Newman Nip, [place], FNP for primary care. I spoke with  [name] by phone today.  What matters to the patients health and wellness today?  Managing medical bills and qualifying for Medicaid. Will talk with CSW.    Goals Addressed             This Visit's Progress    Manage Blood Pressure & Heart Rate/Rhythm   Not on track    Care Coordination Goals: Patient will continue to monitor and record blood pressure and heart rate daily and will call PCP with readings outside of recommended range Patient will have that log available to discuss with RN Care Coordinator at each call Patient will follow a low sodium/DASH diet  Patient will move carefully and change positions slowly to decrease risk of orthostatic hypotension and falls Patient will reach out to RN Care Coordinator [phone] with any care coordination or resource needs       Manage Pain Effectively   Not on track    Care Coordination Goals: Patient will schedule sooner appointment with PCP if needed Appt. In August was cancelled by patient and rescheduled for November Patient will talk with PCP about urgent care recommendation to try Cymbalta. Will need to stop Remeron and consider a different sleep aid if starts Cymbalta Patient will work with physical therapy starting on 03/15/23 at [facility] Patient will reach out to provider with any new or worsening symptoms Patient will reach out to RN Care Coordinator at [phone] with any resource or care coordination needs        SDOH assessments and interventions completed:  Yes SDOH Interventions Today    Flowsheet Row Most Recent Value  SDOH Interventions   Transportation Interventions Patient Resources (Friends/Family)  Physical Activity Interventions Other (Comments)  [starting physical therapy  on 03/15/23]        Care Coordination Interventions:  Yes, provided  Interventions Today    Flowsheet Row Most Recent Value  Chronic Disease   Chronic disease during today's visit Atrial Fibrillation (AFib), Hypertension (HTN), Other  [chronic neuropathic pain]  General Interventions   General Interventions Discussed/Reviewed General Interventions Discussed, General Interventions Reviewed, Durable Medical Equipment (DME), Doctor Visits, Vaccines, Referral to Nurse, Communication with  Vaccines Pneumonia, RSV, Tetanus/Pertussis/Diphtheria, Shingles  [PCP has recommended these but he hasn't had them. He's considering it. Will provide educational matieral by mail.]  Doctor Visits Discussed/Reviewed Doctor Visits Discussed, Doctor Visits Reviewed, Annual Wellness Visits, PCP, Specialist  [Discussed recent specialty visits and chiropractic visit. Aug F/U with PCP cancelled by patient. Follow-up sooner than scheduled if needed. Urgent care on 7/19. Recommended that he talk with PCP about cymbalta and something else for sleep.]  Durable Medical Equipment (DME) BP Cuff  [Pt was not prepared with blood pressure readings for call today. Encouraged to have those for next telephone visit.]  PCP/Specialist Visits Compliance with follow-up visit  [Dr Nida (endocrinologist) on 03/14/23, Danford Bad, LCSW, Dr Ronne Binning (urologist) on 04/11/23, Dr Eden Emms (cardiology) on 04/27/23, Dr Ladona Ridgel (cardiology) on 05/03/23, Dr Terrace Arabia (neurology) on 05/28/23, PCP on 06/22/23. Start Physical therapy on 03/15/23.]  Communication with RN  [Patient's PCP is now [facility]. Transferring Care Coordination services to Edd Arbour, RN. Kim notified via staff message. Pt aware.]  Applications Medicaid  Exercise Interventions   Exercise  Discussed/Reviewed Exercise Discussed, Exercise Reviewed, Physical Activity  [starting physical therapy this week. Not currently exercising]  Physical Activity Discussed/Reviewed Physical  Activity Discussed, Physical Activity Reviewed  Education Interventions   Education Provided Provided Printed Education, Provided Education  [Printed education on Vaccines]  Provided Verbal Education On Medication, When to see the doctor, Nutrition, Applications, Mental Health/Coping with Illness, Labs  [Medicaid application has been submitted to local DSS]  Labs Reviewed Hgb A1c, Kidney Function  [01/29/23 A1C 5.7%, 01/07/23 Creatinine 0.85, GFR >60]  Applications Medicaid  Mental Health Interventions   Mental Health Discussed/Reviewed Mental Health Discussed, Mental Health Reviewed  [continue talking with CSW regarding anxiety, resources, and Medicaid application]  Nutrition Interventions   Nutrition Discussed/Reviewed Nutrition Discussed, Nutrition Reviewed, Fluid intake, Decreasing salt  Pharmacy Interventions   Pharmacy Dicussed/Reviewed Pharmacy Topics Discussed, Pharmacy Topics Reviewed, Medications and their functions  [patient is taking medications regularly without any concerns. Talk with PCP about Cymbalta that was recommended by urgent care for pain and anxiety/depression]  Safety Interventions   Safety Discussed/Reviewed Safety Discussed, Safety Reviewed, Fall Risk, Home Safety  Home Safety Assistive Devices  [physical therapy starts this week]       Follow up plan: Follow up call scheduled for 04/03/23 at 11:15 with Marval Regal, RN    Encounter Outcome:  Pt. Visit Completed   Demetrios Loll, BSN, RN-BC RN Care Coordinator [facility]  [facility] Direct Dial: [phone] Main #: [phone]

## 2023-03-13 NOTE — Therapy (Signed)
Marland Kitchen   OUTPATIENT PHYSICAL THERAPY CERVICAL EVALUATION   Patient Name: Steven [name] MRN: [id number] DOB:[birth year], 72 y.o., male Today's Date: 03/15/2023  END OF SESSION:  PT End of Session - 03/15/23 0737     Visit Number 1    Number of Visits 4    Date for PT Re-Evaluation 04/12/23    Authorization Type [employer]; please check auth    Progress Note Due on Visit 4    PT Start Time 0735    PT Stop Time 0810    PT Time Calculation (min) 35 min    Activity Tolerance Patient tolerated treatment well    Behavior During Therapy [facility] for tasks assessed/performed             Past Medical History:  Diagnosis Date   Anxiety    Arthritis    Atrial fibrillation (HCC)    Atrial flutter (HCC)    BPH (benign prostatic hyperplasia)    Chest pain    Dizziness    Dyspnea    laying down occ   Fracture 08/17/2015   MULTIPLE RIB FRACTURES     FROM FALL    GERD (gastroesophageal reflux disease)    Hemorrhoids    History of kidney stones    noted on CT scan   History of radiation therapy 08/22/11-10/13/11   prostate   Hyperlipemia    Hypertension    Hyperthyroidism    IBS (irritable bowel syndrome)    Insomnia    Light headedness    Migraine    Numbness and tingling in left arm    Numbness and tingling of both legs    Open fracture of left elbow 08/18/2015   Prostate cancer [facility])    prostate s/p radiation Mar 2013   Rib fractures 08/17/2015   Tinnitus    Past Surgical History:  Procedure Laterality Date   BOTOX INJECTION N/A 01/14/2020   Procedure: INJECTION OF BOTOX INTO ANAL SPHINCTER;  Surgeon: Andria Meuse, MD;  Location: [facility] ORS;  Service: General;  Laterality: N/A;   COLONOSCOPY N/A 12/19/2012   [id number] 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 Normal appearing colon   COLONOSCOPY WITH PROPOFOL N/A 10/23/2019   Procedure: COLONOSCOPY WITH PROPOFOL;  Surgeon: Corbin Ade, MD; external and  grade 2 internal hemorrhoids, abnormal rectal blood vessels consistent with radiation proctitis s/p APC therapy, otherwise normal exam.   EVALUATION UNDER ANESTHESIA WITH ANAL FISTULECTOMY N/A 01/14/2020   Procedure: ANORECTAL EXAM UNDER ANESTHESIA;  Surgeon: Andria Meuse, MD;  Location: [facility] ORS;  Service: General;  Laterality: N/A;   HOT HEMOSTASIS  10/23/2019   Procedure: HOT HEMOSTASIS (ARGON PLASMA COAGULATION/BICAP);  Surgeon: Corbin Ade, MD;  Location: [facility];  Service: Endoscopy;;  apc rectal proctitis     KIDNEY SURGERY  1982   kidney tube collapse repair   RADIOACTIVE SEED IMPLANT     Prostate   SVT ABLATION N/A 12/12/2022   Procedure: SVT ABLATION;  Surgeon: Marinus Maw, MD;  Location: [facility] INVASIVE CV LAB;  Service: Cardiovascular;  Laterality: N/A;   Patient Active Problem List   Diagnosis Date Noted   Mixed hyperlipidemia 03/14/2023   Prediabetes 03/14/2023   Diffuse pain 02/19/2023   Hypertensive urgency 11/24/2022   Leukopenia 11/24/2022   SVT (supraventricular tachycardia) 11/24/2022   Orthostatic hypotension 11/23/2022   Chest tightness 08/22/2022   Hypokalemia 08/21/2022   Calculus of gallbladder without cholecystitis without obstruction 08/09/2022  Secondary adrenocortical insufficiency (HCC) 07/19/2022   Rash and nonspecific skin eruption 05/10/2022   Dysphagia 05/10/2022   Chronic pain 07/07/2020   Paresthesia of upper limb 03/04/2020   Carcinoma of prostate (HCC) 02/17/2020   Subacute thyroiditis 01/07/2020   Personal history of malignant neoplasm of prostate 12/12/2019   Benign prostatic hyperplasia with urinary obstruction 12/12/2019   Weak urinary stream 12/12/2019   Hyperthyroidism 11/24/2019   Unintentional weight loss 10/21/2019   IBS (irritable bowel syndrome) 10/21/2019   Dizzy spells 09/20/2019   Intractable chronic migraine without aura 09/20/2019   Obstructive sleep apnea syndrome 09/20/2019   Psychophysiologic insomnia  09/20/2019   Rectal pain 08/21/2019   Nausea without vomiting 05/30/2019   Paroxysmal atrial flutter (HCC) 04/17/2019   Chronic atrial fibrillation (HCC) 03/02/2019   HTN (hypertension) 03/02/2019   Constipation 08/02/2017   Hemorrhoids 08/02/2017   Fall 08/18/2015   Radiation proctitis 01/30/2013   Rectal bleeding 12/10/2012   Obese 05/30/2012   Dyspnea 05/30/2012   Elevated lipids 05/30/2012   Palpitations 05/30/2012   Malignant neoplasm of prostate (HCC) 08/13/2011    PCP: William Hamburger, FNP  REFERRING PROVIDER: Bedelia Person, MD  REFERRING DIAG:  Diagnosis  [phone] (ICD-10-CM) - Spondylosis without myelopathy or radiculopathy, cervical region    THERAPY DIAG:  Cervicalgia - Plan: PT plan of care cert/re-cert  Rationale for Evaluation and Treatment: Rehabilitation  ONSET DATE: off and on for about a year  SUBJECTIVE:                                                                                                                                                                                                         SUBJECTIVE STATEMENT: Neck pain off and on for about a year; saw Dr. Maisie Fus who tells him that it's mostly arthritic changes and degenerative changes; sometimes has some numbness and tingling in hands. Reports he is also seeing a chiropractor Hand dominance: Right  PERTINENT HISTORY:  Heart ablation May 2024 had a fall at work in 2017   PAIN:  Are you having pain? Yes: NPRS scale: 0-5/10 Pain location: neck Pain description: stiff, tired, sore Aggravating factors: worse at night Relieving factors: change positions  PRECAUTIONS: None   WEIGHT BEARING RESTRICTIONS: No  FALLS:  Has patient fallen in last 6 months? No  OCCUPATION: retired  PLOF: Independent  PATIENT GOALS: relieve pain  NEXT MD VISIT: PRN  OBJECTIVE:   DIAGNOSTIC FINDINGS:  None recent in epic  PATIENT SURVEYS:  FOTO 78  COGNITION: Overall cognitive  status: Within functional limits  for tasks assessed  SENSATION: Some numbness and tingling in hands  POSTURE: rounded shoulders, forward head, and increased thoracic kyphosis  PALPATION: Tight bilateral upper traps R > L; general soreness cervical paraspinals   CERVICAL ROM:   Active ROM AROM (deg) eval  Flexion 51  Extension 18  Right lateral flexion 26  Left lateral flexion 32  Right rotation 50*  Left rotation 65   (Blank rows = not tested)  UPPER EXTREMITY ROM:  Active ROM Right eval Left eval  Shoulder flexion (sitting) 132   Shoulder extension    Shoulder abduction    Shoulder adduction    Shoulder extension    Shoulder internal rotation    Shoulder external rotation    Elbow flexion    Elbow extension    Wrist flexion    Wrist extension    Wrist ulnar deviation    Wrist radial deviation    Wrist pronation    Wrist supination     (Blank rows = not tested)  UPPER EXTREMITY MMT:  MMT Right eval Left eval  Shoulder flexion 5 4+  Shoulder extension    Shoulder abduction    Shoulder adduction    Shoulder extension    Shoulder internal rotation    Shoulder external rotation    Middle trapezius    Lower trapezius    Elbow flexion    Elbow extension    Wrist flexion    Wrist extension    Wrist ulnar deviation    Wrist radial deviation    Wrist pronation    Wrist supination    Grip strength     (Blank rows = not tested)  CERVICAL SPECIAL TESTS:  Distraction test: Negative  FUNCTIONAL TESTS:  5 times sit to stand: 15.78 using hands 1st repetition.  TODAY'S TREATMENT:                                                                                                                              DATE: 03/15/23 physical therapy evaluation and HEP instruction   PATIENT EDUCATION:  Education details: Patient educated on exam findings, POC, scope of PT, HEP, and what to expect next visit. Person educated: Patient Education method: Explanation,  Demonstration, and Handouts Education comprehension: verbalized understanding, returned demonstration, verbal cues required, and tactile cues required  HOME EXERCISE PROGRAM: Access Code: [email or web address] URL: [email or web address] Date: 03/15/2023 Prepared by: [facility] - Rehab  Exercises - Seated Cervical Sidebending Stretch  - 2 x daily - 7 x weekly - 1 sets - 5 reps - 20 sec hold - Seated Cervical Retraction  - 2 x daily - 7 x weekly - 1 sets - 10 reps - Seated Correct Posture  - 1 x daily - 7 x weekly - 1 sets - 1 reps  ASSESSMENT:  CLINICAL IMPRESSION: Patient is a 72 y.o. male who was seen today for physical therapy evaluation and treatment for 47.812 (ICD-10-CM) - Spondylosis without myelopathy or radiculopathy, cervical  region.  Patient demonstrates decreased strength, ROM restriction, reduced flexibility, increased tenderness to palpation and postural abnormalities which are likely contributing to symptoms of pain and are negatively impacting patient ability to perform ADLs. Patient will benefit from skilled physical therapy services to address these deficits to reduce pain and improve level of function with ADLs   OBJECTIVE IMPAIRMENTS: decreased activity tolerance, decreased mobility, decreased ROM, decreased strength, hypomobility, increased fascial restrictions, impaired perceived functional ability, postural dysfunction, and pain.   ACTIVITY LIMITATIONS: carrying, lifting, sitting, standing, and reach over head  PARTICIPATION LIMITATIONS: meal prep, cleaning, laundry, driving, shopping, community activity, and yard work  [facility] POTENTIAL: Good  CLINICAL DECISION MAKING: Stable/uncomplicated  EVALUATION COMPLEXITY: Low   GOALS: Goals reviewed with patient? No  SHORT TERM GOALS: Target date: 03/29/2023  patient will be independent with initial HEP  Baseline:  Goal status: INITIAL  2.  Patient will self report 30% improvement to improve tolerance for functional  activity   Baseline:  Goal status: INITIAL  LONG TERM GOALS: Target date: 04/12/2023  Patient will be independent in self management strategies to improve quality of life and functional outcomes Baseline:  Goal status: INITIAL  2.  Patient will self report 50% improvement to improve tolerance for functional activity   Baseline:  Goal status: INITIAL  3.  Patient will improve FOTO score by 5 points to demonstrate improved perceived functional mobility  Baseline: 61 Goal status: INITIAL  4.  Patient will improve cervical mobility by 20 degrees total to improve ability to scan for safety Baseline: see above Goal status: INITIAL  5.  Patient will demonstrate good sitting posture without cues x 15' to be able to perform short drives without increased neck pain Baseline:  Goal status: INITIAL   PLAN:  PT FREQUENCY: 1x/week  PT DURATION: 4 weeks  PLANNED INTERVENTIONS: Therapeutic exercises, Therapeutic activity, Neuromuscular re-education, Balance training, Gait training, Patient/Family education, Joint manipulation, Joint mobilization, Stair training, Orthotic/Fit training, DME instructions, Aquatic Therapy, Dry Needling, Electrical stimulation, Spinal manipulation, Spinal mobilization, Cryotherapy, Moist heat, Compression bandaging, scar mobilization, Splintting, Taping, Traction, Ultrasound, Ionotophoresis 4mg /ml Dexamethasone, and Manual therapy   PLAN FOR NEXT SESSION:would recommend 2 x a week but due to copay patient requests 1x a week.   Review HEP and goals; cervical mobility, postural strengthening; manual as needed  8:18 AM, 03/15/23 Berthold Glace Small Anja Neuzil MPT [facility] [phone] [phone]

## 2023-03-14 ENCOUNTER — Ambulatory Visit: Payer: Medicare PPO | Admitting: "Endocrinology

## 2023-03-14 ENCOUNTER — Encounter: Payer: Self-pay | Admitting: "Endocrinology

## 2023-03-14 VITALS — BP 128/76 | HR 62 | Ht 67.0 in | Wt 195.0 lb

## 2023-03-14 DIAGNOSIS — E0590 Thyrotoxicosis, unspecified without thyrotoxic crisis or storm: Secondary | ICD-10-CM

## 2023-03-14 DIAGNOSIS — R7303 Prediabetes: Secondary | ICD-10-CM

## 2023-03-14 DIAGNOSIS — E782 Mixed hyperlipidemia: Secondary | ICD-10-CM

## 2023-03-14 MED ORDER — METHIMAZOLE 5 MG PO TABS: 2.5000 mg | ORAL_TABLET | Freq: Every day | ORAL | 1 refills | Status: DC

## 2023-03-14 NOTE — Telephone Encounter (Signed)
Patient calling needing refill on ondansetron (ZOFRAN-ODT) 4 MG disintegrating tablet [[id number]] and Nexletol 180mg . Please advise [facility] on [address] Thank you

## 2023-03-14 NOTE — Progress Notes (Signed)
03/14/2023     Endocrinology follow-up note   Subjective:    Patient ID: Steven [name], male    DOB: 03-29-1951, PCP Del Nigel Berthold, FNP.   Past Medical History:  Diagnosis Date   Anxiety    Arthritis    Atrial fibrillation (HCC)    Atrial flutter (HCC)    BPH (benign prostatic hyperplasia)    Chest pain    Dizziness    Dyspnea    laying down occ   Fracture 08/17/2015   MULTIPLE RIB FRACTURES     FROM FALL    GERD (gastroesophageal reflux disease)    Hemorrhoids    History of kidney stones    noted on CT scan   History of radiation therapy 08/22/11-10/13/11   prostate   Hyperlipemia    Hypertension    Hyperthyroidism    IBS (irritable bowel syndrome)    Insomnia    Light headedness    Migraine    Numbness and tingling in left arm    Numbness and tingling of both legs    Open fracture of left elbow 08/18/2015   Prostate cancer [facility])    prostate s/p radiation Mar 2013   Rib fractures 08/17/2015   Tinnitus     Past Surgical History:  Procedure Laterality Date   BOTOX INJECTION N/A 01/14/2020   Procedure: INJECTION OF BOTOX INTO ANAL SPHINCTER;  Surgeon: Andria Meuse, MD;  Location: [facility];  Service: General;  Laterality: N/A;   COLONOSCOPY N/A 12/19/2012   [id number] 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 Normal appearing colon   COLONOSCOPY WITH PROPOFOL N/A 10/23/2019   Procedure: COLONOSCOPY WITH PROPOFOL;  Surgeon: Corbin Ade, MD; external and grade 2 internal hemorrhoids, abnormal rectal blood vessels consistent with radiation proctitis s/p APC therapy, otherwise normal exam.   EVALUATION UNDER ANESTHESIA WITH ANAL FISTULECTOMY N/A 01/14/2020   Procedure: ANORECTAL EXAM UNDER ANESTHESIA;  Surgeon: Andria Meuse, MD;  Location: [facility];  Service: General;  Laterality: N/A;   HOT HEMOSTASIS  10/23/2019   Procedure: HOT HEMOSTASIS (ARGON PLASMA COAGULATION/BICAP);   Surgeon: Corbin Ade, MD;  Location: [facility];  Service: Endoscopy;;  apc rectal proctitis     KIDNEY SURGERY  1982   kidney tube collapse repair   RADIOACTIVE SEED IMPLANT     Prostate   SVT ABLATION N/A 12/12/2022   Procedure: SVT ABLATION;  Surgeon: Marinus Maw, MD;  Location: [facility] INVASIVE CV LAB;  Service: Cardiovascular;  Laterality: N/A;    Social History   Socioeconomic History   Marital status: Single    Spouse name: Not on file   Number of children: 2   Years of education: 12   Highest education level: 12th grade  Occupational History   Occupation: Custodian     Employer: [employer]  Tobacco Use   Smoking status: Former    Current packs/day: 0.00    Average packs/day: 1 pack/day for 20.0 years (20.0 ttl pk-yrs)    Types: Cigarettes    Start date: 2000    Quit date: 2020    Years since quitting: 4.6    Passive exposure: Past   Smokeless tobacco: Never   Tobacco comments:    Quit smoking x 2 years    07/2015   SOMETIIMES i USE VAPOR     Smoking Cessation Classes, Agencies, Public librarian  Vaping Use   Vaping status:  Never Used  Substance and Sexual Activity   Alcohol use: Never   Drug use: Never   Sexual activity: Not Currently    Partners: Female  Other Topics Concern   Not on file  Social History Narrative   Lives with friend   Divorced   No caffeine   Social Determinants of Health   Financial Resource Strain: Medium Risk (01/08/2023)   Overall Financial Resource Strain (CARDIA)    Difficulty of Paying Living Expenses: Somewhat hard  Food Insecurity: No Food Insecurity (01/08/2023)   Hunger Vital Sign    Worried About Running Out of Food in the Last Year: Never true    Ran Out of Food in the Last Year: Never true  Transportation Needs: No Transportation Needs (03/12/2023)   PRAPARE - Administrator, Civil Service (Medical): No    Lack of Transportation (Non-Medical): No  Physical Activity: Inactive  (03/12/2023)   Exercise Vital Sign    Days of Exercise per Week: 0 days    Minutes of Exercise per Session: 0 min  Stress: Stress Concern Present (01/08/2023)   [employer] of Occupational Health - Occupational Stress Questionnaire    Feeling of Stress : Rather much  Social Connections: Unknown (01/08/2023)   Social Connection and Isolation Panel [NHANES]    Frequency of Communication with Friends and Family: More than three times a week    Frequency of Social Gatherings with Friends and Family: More than three times a week    Attends Religious Services: 1 to 4 times per year    Active Member of [employer] or Organizations: No    Attends Banker Meetings: Never    Marital Status: Not on file    Family History  Problem Relation Age of Onset   Aneurysm Mother        aortic   Hypertension Mother    Headache Mother    Prostate cancer Father    Hypertension Father    Colon cancer Neg Hx    Colon polyps Neg Hx     Outpatient Encounter Medications as of 03/14/2023  Medication Sig   acetaminophen (TYLENOL) 500 MG tablet Take 500 mg by mouth every 6 (six) hours as needed for moderate pain.   alfuzosin (UROXATRAL) 10 MG 24 hr tablet Take 1 tablet (10 mg total) by mouth daily with breakfast.   amLODipine (NORVASC) 5 MG tablet Take 1 tablet (5 mg total) by mouth daily.   Azelastine-Fluticasone 137-50 MCG/ACT SUSP Place 1 spray into the nose every 12 (twelve) hours.   Cholecalciferol (VITAMIN D3) 125 MCG (5000 UT) CAPS Take 1 capsule by mouth daily.    cloNIDine (CATAPRES) 0.1 MG tablet Take 1 tablet (0.1 mg total) by mouth 2 (two) times daily as needed (SBP>160). (Patient taking differently: Take 0.1 mg by mouth 3 (three) times daily.)   ELIQUIS 5 MG TABS tablet TAKE 1 TABLET(5 MG) BY MOUTH TWICE DAILY   furosemide (LASIX) 20 MG tablet Take 1 tablet (20 mg total) by mouth daily. Take with or after meal.   gabapentin (NEURONTIN) 300 MG capsule Take 1 capsule (300 mg total) by  mouth 3 (three) times daily.   Galcanezumab-gnlm (EMGALITY) 120 MG/ML SOAJ Inject 1 Pen into the skin every 30 (thirty) days.   hydrOXYzine (ATARAX) 10 MG tablet Take 1 tablet (10 mg total) by mouth 3 (three) times daily as needed for anxiety.   hyoscyamine (LEVBID) 0.375 MG 12 hr tablet Take 0.375 mg by mouth 2 (two)  times daily.   loratadine (CLARITIN) 10 MG tablet Take 1 tablet (10 mg total) by mouth daily.   losartan (COZAAR) 100 MG tablet Take 100 mg by mouth daily.   methimazole (TAPAZOLE) 5 MG tablet Take 0.5 tablets (2.5 mg total) by mouth daily.   metoprolol tartrate (LOPRESSOR) 25 MG tablet Take 1 tablet (25 mg total) by mouth 2 (two) times daily. TAKE EXTRA HALF TABLET  AS NEEDED   mirtazapine (REMERON) 7.5 MG tablet Take 1 tablet (7.5 mg total) by mouth at bedtime.   NURTEC 75 MG TBDP Take 75 mg by mouth daily as needed (pain).   ondansetron (ZOFRAN-ODT) 4 MG disintegrating tablet Take 1 tablet (4 mg total) by mouth every 8 (eight) hours as needed for nausea or vomiting.   Ondansetron HCl (ZOFRAN PO) Take by mouth as needed.   polyethylene glycol (MIRALAX / GLYCOLAX) 17 g packet Take 17 g by mouth daily.   potassium chloride SA (KLOR-CON M) 20 MEQ tablet Take 1 tablet (20 mEq total) by mouth daily.   Probiotic Product (PROBIOTIC-10 PO) Take 1 capsule by mouth daily.   Ubrogepant (UBRELVY) 100 MG TABS Take 1 tablet (100 mg total) by mouth as needed (for headaches). May repeat a dose in 2 hours if needed. Max dose 2 pills in 24 hours   vitamin B-12 (CYANOCOBALAMIN) 100 MCG tablet Take 100 mcg by mouth daily.   [DISCONTINUED] gabapentin (NEURONTIN) 300 MG capsule Take 1 capsule (300 mg total) by mouth 3 (three) times daily as needed.   [DISCONTINUED] methimazole (TAPAZOLE) 5 MG tablet Take 1 tablet (5 mg total) by mouth daily.   No facility-administered encounter medications on file as of 03/14/2023.    ALLERGIES: Allergies  Allergen Reactions   Latex Rash    Possible reaction to  latex gloves per patient   Meclizine     Other reaction(s): Abdominal Pain, Other    VACCINATION STATUS: Immunization History  Administered Date(s) Administered   Influenza,inj,quad, With Preservative 04/03/2019   Influenza-Unspecified 05/26/2020   Moderna Sars-Covid-2 Vaccination 09/07/2019, 10/12/2019     HPI  Steven [name] is 72 y.o. male who was previously seen in 2021 for subclinical hypothyroidism and mild hypocortisolemia.  His subsequent workup did not reveal adrenal dysfunction.  After appropriate workup, he was diagnosed with hypothyroidism, without elevated thyroid uptake and scan evidence.  He was put on methimazole which was progressively lowered to 5 mg p.o. daily with clinical and biochemical response.    He has reversed his symptoms thyrotoxicosis.  He has no new complaints today.   He presents with repeat thyroid function tests which are consistent with over treatment.   He has more stable body weight currently. he denies dysphagia, choking, shortness of breath, no recent voice change.    he denies  family history of thyroid dysfunction. He  denies family hx of thyroid cancer. he denies personal history of goiter.                           Review of systems  Limited as above.   Objective:    BP 128/76   Pulse 62   Ht 5\' 7"  (1.702 m)   Wt 195 lb (88.5 kg)   BMI 30.54 kg/m   Wt Readings from Last 3 Encounters:  03/14/23 195 lb (88.5 kg)  02/19/23 189 lb (85.7 kg)  02/01/23 180 lb (81.6 kg)     His physical exam is negative for goiter.  CMP     Component Value Date/Time   NA 140 01/07/2023 1953   NA 141 11/10/2019 1051   K 3.9 01/07/2023 1953   CL 104 01/07/2023 1953   CO2 29 01/07/2023 1953   GLUCOSE 109 (H) 01/07/2023 1953   BUN 8 01/07/2023 1953   BUN 8 04/16/2020 0000   CREATININE 0.85 01/07/2023 1953   CALCIUM 9.2 01/07/2023 1953   PROT 6.8 01/07/2023 1953   ALBUMIN 3.6 01/07/2023 1953   AST 15 01/07/2023 1953   ALT 12 01/07/2023  1953   ALKPHOS 68 01/07/2023 1953   BILITOT 0.4 01/07/2023 1953   GFRNONAA >60 01/07/2023 1953   GFRAA >60 02/19/2020 1622     CBC    Component Value Date/Time   WBC 3.4 (L) 01/07/2023 1953   RBC 4.56 01/07/2023 1953   HGB 13.3 01/07/2023 1953   HCT 41.4 01/07/2023 1953   PLT 197 01/07/2023 1953   MCV 90.8 01/07/2023 1953   MCH 29.2 01/07/2023 1953   MCHC 32.1 01/07/2023 1953   RDW 12.7 01/07/2023 1953   LYMPHSABS 1.3 01/07/2023 1953   MONOABS 0.2 01/07/2023 1953   EOSABS 0.1 01/07/2023 1953   BASOSABS 0.0 01/07/2023 1953   Lipid Panel     Component Value Date/Time   CHOL 189 03/08/2023 1405   TRIG 103 03/08/2023 1405   HDL 52 03/08/2023 1405   CHOLHDL 3.6 03/08/2023 1405   CHOLHDL 4.4 03/02/2019 0329   VLDL 18 03/02/2019 0329   LDLCALC 118 (H) 03/08/2023 1405   LABVLDL 19 03/08/2023 1405     Lab Results  Component Value Date   TSH 7.020 (H) 03/08/2023   TSH 8.490 (H) 01/12/2023   TSH 3.950 12/20/2022   TSH 0.119 (L) 10/06/2022   TSH 0.015 (L) 09/11/2022   TSH 0.011 (L) 09/08/2022   TSH 0.018 (L) 08/22/2022   TSH 0.030 (L) 08/13/2022   TSH 0.036 (L) 08/10/2022   TSH 0.271 (L) 08/01/2022   FREET4 0.77 (L) 03/08/2023   FREET4 0.97 01/12/2023   FREET4 1.10 12/20/2022   FREET4 1.48 10/06/2022   FREET4 1.77 09/11/2022   FREET4 2.16 (H) 08/21/2022   FREET4 1.71 (H) 08/01/2022   FREET4 1.19 07/14/2020   FREET4 1.37 01/01/2020   FREET4 1.37 (H) 10/22/2019    Thyroid uptake and scan on Nov 28, 2019 FINDINGS: There is fairly homogeneous thyroid uptake. The left lobe extends more inferiorly than the right, although no focal hot or cold nodules are identified.   4 hour I-123 uptake = 4.1% (normal 5-20%)  24 hour I-123 uptake = 12.9% (normal 10-30%)   IMPRESSION: 1. Low normal radioactive iodine uptake by the thyroid gland. 2. No focal hot or cold nodules identified.    Assessment & Plan:   1. Hyperthyroidism-  2.  Hyperlipidemia    3.  Prediabetes    4.  Hypervitaminosis B12 His previsit labs are consistent with treatment effect with improving thyroid function profile.  He is advised to lower methimazole to 2.5 mg p.o. once daily at breakfast.  His pulse rate is 62, he was already on beta-blocker-metoprolol 25 mg p.o. twice daily for treatment of hypertension. -I advised him to lower his methimazole to 2.5 mg p.o. daily at breakfast. He will return to clinic for follow-up with repeat thyroid function tests in 12 weeks. His recent adrenal assessment is indicated  sufficient adrenal response including random cortisol and ACTH stimulation test ruling out adrenal insufficiency.  In light of his metabolic dysfunction indicated  by prediabetes, hyperlipidemia, whole food plant-based diet was discussed with him. -He is also advised to stay off of his vitamin B12 supplements until next measurement.  -Patient is advised to maintain close follow up with Del Nigel Berthold, FNP for primary care needs.    I spent  25  minutes in the care of the patient today including review of labs from Thyroid Function, CMP, and other relevant labs ; imaging/biopsy records (current and previous including abstractions from other facilities); face-to-face time discussing  his lab results and symptoms, medications doses, his options of short and long term treatment based on the latest standards of care / guidelines;   and documenting the encounter.  Steven [name]  participated in the discussions, expressed understanding, and voiced agreement with the above plans.  All questions were answered to his satisfaction. he is encouraged to contact clinic should he have any questions or concerns prior to his return visit.    Follow up plan: Return in about 3 months (around 06/14/2023) for Fasting Labs  in AM B4 8, A1c -NV.   Thank you for involving me in the care of this pleasant patient, and I will continue to update you with his progress.  Marquis Lunch, MD [facility]  [facility] Phone: [phone]  Fax: [phone]   03/14/2023, 10:09 AM  This note was partially dictated with voice recognition software. Similar sounding words can be transcribed inadequately or may not  be corrected upon review.

## 2023-03-15 ENCOUNTER — Ambulatory Visit (HOSPITAL_COMMUNITY): Payer: Medicare PPO | Attending: Neurosurgery

## 2023-03-15 ENCOUNTER — Other Ambulatory Visit: Payer: Self-pay

## 2023-03-15 DIAGNOSIS — M542 Cervicalgia: Secondary | ICD-10-CM | POA: Diagnosis not present

## 2023-03-16 ENCOUNTER — Other Ambulatory Visit: Payer: Self-pay | Admitting: Family Medicine

## 2023-03-16 MED ORDER — ONDANSETRON HCL 4 MG PO TABS: 4.0000 mg | ORAL_TABLET | Freq: Three times a day (TID) | ORAL | 1 refills | Status: AC | PRN

## 2023-03-20 ENCOUNTER — Encounter: Payer: Self-pay | Admitting: *Deleted

## 2023-03-20 ENCOUNTER — Ambulatory Visit: Payer: Self-pay | Admitting: *Deleted

## 2023-03-20 NOTE — Patient Outreach (Signed)
Care Coordination   Follow Up Visit Note   03/20/2023  Name: Steven [name] MRN: [id number] DOB: 11-16-1950  Steven [name] is a 72 y.o. year old male who sees Del Newman Nip, [place], FNP for primary care. I spoke with [name] by phone today.  What matters to the patients health and wellness today?  Receive Counseling & Supportive Services for Symptoms of Anxiety.   Goals Addressed               This Visit's Progress     Receive Counseling & Supportive Services for Symptoms of Anxiety. (pt-stated)   On track     Care Coordination Interventions:  Interventions Today    Flowsheet Row Most Recent Value  Chronic Disease   Chronic disease during today's visit Hypertension (HTN), Atrial Fibrillation (AFib), Other  [Uncontrolled Blood Pressure, Financial Insecurities & Anxiety]  General Interventions   General Interventions Discussed/Reviewed General Interventions Discussed, Labs, Vaccines, Doctor Visits, Health Screening, Annual Foot Exam, General Interventions Reviewed, Annual Eye Exam, Durable Medical Equipment (DME), [employer], Level of Care, Communication with  [Encouraged]  Labs Kidney Function  [Encouraged]  Vaccines COVID-19, Flu, Pneumonia, RSV, Tetanus/Pertussis/Diphtheria, Shingles  [Encouraged]  Doctor Visits Discussed/Reviewed Doctor Visits Discussed, Specialist, Doctor Visits Reviewed, Annual Wellness Visits, PCP  [Encouraged]  Health Screening Bone Density, Colonoscopy, Prostate  [Encouraged]  Durable Medical Equipment (DME) BP Cuff, Wheelchair  [Encouraged]  Wheelchair Standard  [Encouraged]  PCP/Specialist Visits Compliance with follow-up visit  [Encouraged]  Communication with RN, PCP/Specialists  [Encouraged]  Level of Care Adult Daycare, Applications, Assisted Living, Personal Care Services  [Encouraged]  Applications Medicaid, Personal Care Services  [Encouraged]  Exercise Interventions   Exercise Discussed/Reviewed Exercise Discussed,  Assistive device use and maintanence, Exercise Reviewed, Physical Activity, Weight Managment  [Encouraged]  Physical Activity Discussed/Reviewed Physical Activity Discussed, Home Exercise Program (HEP), Physical Activity Reviewed, Types of exercise  [Encouraged]  Weight Management Weight loss  [Encouraged]  Education Interventions   Education Provided Provided Therapist, sports, Provided Web-based Education, Provided Education  [Encouraged]  Provided Verbal Education On Nutrition, Mental Health/Coping with Illness, When to see the doctor, Foot Care, Eye Care, Labs, Applications, Exercise, Medication, Development worker, community, [employer]  [Encouraged]  Labs Reviewed Kidney Function, Lipid Profile  [Encouraged]  Applications Medicaid, Personal Care Services  [Encouraged]  Mental Health Interventions   Mental Health Discussed/Reviewed Mental Health Discussed, Anxiety, Depression, Mental Health Reviewed, Grief and Loss, Substance Abuse, Coping Strategies, Suicide, Crisis, Other  [Domestic Violence]  Nutrition Interventions   Nutrition Discussed/Reviewed Nutrition Discussed, Adding fruits and vegetables, Increasing proteins, Decreasing fats, Decreasing salt, Nutrition Reviewed, Fluid intake, Carbohydrate meal planning, Portion sizes, Decreasing sugar intake  [Encouraged]  Pharmacy Interventions   Pharmacy Dicussed/Reviewed Pharmacy Topics Discussed, Medications and their functions, Medication Adherence, Pharmacy Topics Reviewed, Affording Medications  [Encouraged]  Safety Interventions   Safety Discussed/Reviewed Safety Discussed, Safety Reviewed, Fall Risk, Home Safety  [Encouraged]  Home Safety Assistive Devices, Need for home safety assessment, Refer for community resources  [Encouraged]  Advanced Directive Interventions   Advanced Directives Discussed/Reviewed Advanced Directives Discussed  [Encouraged]      Active Listening & Reflection Utilized.  Verbalization of Feelings Encouraged.   Emotional Support Provided. Problem Solving Interventions Activated. Task-Centered Solutions Indicated.   Solution-Focused Strategies Employed. Acceptance & Commitment Therapy Initiated. Cognitive Behavioral Therapy Performed. Congratulations on Approval for Financial Assistance through [facility] & 50% of Medical Expenses Written Off, Via [facility] Health Financial Assistance Program. CSW Collaboration with Konrad Dolores  [place], Family Nurse Practitioner with [facility] [phone]), Via Secure Chat Message in Epic, to Request Referral to Pain Management Clinic, Per Your Request. Encouraged Continued Engagement with Physical Therapist, [place] Organ with [place] [facility], to Improve Strengthening, Conditioning, Mobility, Safety, Pain Control, Etc. Encouraged Self-Enrollment with Psychiatrist of Interest in [facility], from List Provided, in An Effort to Receive Psychotropic Medication Administration & Management, to Reduce & Manage Symptoms of Anxiety. Encouraged Self-Enrollment with Therapist of Interest in [facility], from List Provided, in An Effort to Receive Psychotherapeutic Counseling & Supportive Services, to Reduce & Manage Symptoms of Anxiety. Encouraged Contact with CSW (# [phone]), if You Have Questions, Need Assistance, or If Additional Social Work Needs Are Identified Between Now & Our Next Scheduled Follow-Up Outreach Call.      SDOH assessments and interventions completed:  Yes.  Care Coordination Interventions:  Yes, provided.   Follow up plan: Follow up call scheduled for 04/03/2023 at 3:30 pm.  Encounter Outcome:  Pt. Visit Completed.   Danford Bad, BSW, MSW, Printmaker Social Work Case Set designer Health  [facility], Population Health Direct Dial: [phone]  Fax: [phone] Email: [email or web address]: [email or web address]

## 2023-03-20 NOTE — Patient Instructions (Signed)
Visit Information  Thank you for taking time to visit with me today. Please don't hesitate to contact me if I can be of assistance to you.   Following are the goals we discussed today:   Goals Addressed               This Visit's Progress     Receive Counseling & Supportive Services for Symptoms of Anxiety. (pt-stated)   On track     Care Coordination Interventions:  Interventions Today    Flowsheet Row Most Recent Value  Chronic Disease   Chronic disease during today's visit Hypertension (HTN), Atrial Fibrillation (AFib), Other  [Uncontrolled Blood Pressure, Financial Insecurities & Anxiety]  General Interventions   General Interventions Discussed/Reviewed General Interventions Discussed, Labs, Vaccines, Doctor Visits, Health Screening, Annual Foot Exam, General Interventions Reviewed, Annual Eye Exam, Durable Medical Equipment (DME), [employer], Level of Care, Communication with  [Encouraged]  Labs Kidney Function  [Encouraged]  Vaccines COVID-19, Flu, Pneumonia, RSV, Tetanus/Pertussis/Diphtheria, Shingles  [Encouraged]  Doctor Visits Discussed/Reviewed Doctor Visits Discussed, Specialist, Doctor Visits Reviewed, Annual Wellness Visits, PCP  [Encouraged]  Health Screening Bone Density, Colonoscopy, Prostate  [Encouraged]  Durable Medical Equipment (DME) BP Cuff, Wheelchair  [Encouraged]  Wheelchair Standard  [Encouraged]  PCP/Specialist Visits Compliance with follow-up visit  [Encouraged]  Communication with RN, PCP/Specialists  [Encouraged]  Level of Care Adult Daycare, Applications, Assisted Living, Personal Care Services  [Encouraged]  Applications Medicaid, Personal Care Services  [Encouraged]  Exercise Interventions   Exercise Discussed/Reviewed Exercise Discussed, Assistive device use and maintanence, Exercise Reviewed, Physical Activity, Weight Managment  [Encouraged]  Physical Activity Discussed/Reviewed Physical Activity Discussed, Home Exercise Program (HEP),  Physical Activity Reviewed, Types of exercise  [Encouraged]  Weight Management Weight loss  [Encouraged]  Education Interventions   Education Provided Provided Therapist, sports, Provided Web-based Education, Provided Education  [Encouraged]  Provided Verbal Education On Nutrition, Mental Health/Coping with Illness, When to see the doctor, Foot Care, Eye Care, Labs, Applications, Exercise, Medication, Development worker, community, [employer]  [Encouraged]  Labs Reviewed Kidney Function, Lipid Profile  [Encouraged]  Applications Medicaid, Personal Care Services  [Encouraged]  Mental Health Interventions   Mental Health Discussed/Reviewed Mental Health Discussed, Anxiety, Depression, Mental Health Reviewed, Grief and Loss, Substance Abuse, Coping Strategies, Suicide, Crisis, Other  [Domestic Violence]  Nutrition Interventions   Nutrition Discussed/Reviewed Nutrition Discussed, Adding fruits and vegetables, Increasing proteins, Decreasing fats, Decreasing salt, Nutrition Reviewed, Fluid intake, Carbohydrate meal planning, Portion sizes, Decreasing sugar intake  [Encouraged]  Pharmacy Interventions   Pharmacy Dicussed/Reviewed Pharmacy Topics Discussed, Medications and their functions, Medication Adherence, Pharmacy Topics Reviewed, Affording Medications  [Encouraged]  Safety Interventions   Safety Discussed/Reviewed Safety Discussed, Safety Reviewed, Fall Risk, Home Safety  [Encouraged]  Home Safety Assistive Devices, Need for home safety assessment, Refer for community resources  [Encouraged]  Advanced Directive Interventions   Advanced Directives Discussed/Reviewed Advanced Directives Discussed  [Encouraged]      Active Listening & Reflection Utilized.  Verbalization of Feelings Encouraged.  Emotional Support Provided. Problem Solving Interventions Activated. Task-Centered Solutions Indicated.   Solution-Focused Strategies Employed. Acceptance & Commitment Therapy Initiated. Cognitive  Behavioral Therapy Performed. Congratulations on Approval for Financial Assistance through [facility] & 50% of Medical Expenses Written Off, Via [facility] Financial Assistance Program. CSW Collaboration with Rica Records, Family Nurse Practitioner with [facility] [phone]), Via Secure Chat Message in Epic, to Request Referral to Pain Management Clinic, Per Your Request. Encouraged Continued Engagement with Physical Therapist,  Virgina Organ with [facility], to Improve Strengthening, Conditioning, Mobility, Safety, Pain Control, Etc. Encouraged Self-Enrollment with Psychiatrist of Interest in [facility], from List Provided, in An Effort to Receive Psychotropic Medication Administration & Management, to Reduce & Manage Symptoms of Anxiety. Encouraged Self-Enrollment with Therapist of Interest in [facility], from List Provided, in An Effort to Receive Psychotherapeutic Counseling & Supportive Services, to Reduce & Manage Symptoms of Anxiety. Encouraged Contact with CSW (# [phone]), if You Have Questions, Need Assistance, or If Additional Social Work Needs Are Identified Between Now & Our Next Scheduled Follow-Up Outreach Call.      Our next appointment is by telephone on 04/03/2023 at 3:30 pm.  Please call the care guide team at [phone] if you need to cancel or reschedule your appointment.   If you are experiencing a Mental Health or Behavioral Health Crisis or need someone to talk to, please call the Suicide and Crisis Lifeline: 988 call the [place] National Suicide Prevention Lifeline: (425)432-6316 or TTY: 815-261-8918 TTY (903) 765-9170) to talk to a trained counselor call 1-800-273-TALK (toll free, 24 hour hotline) go to [facility] [address] [place] [phone]) call the [facility] Crisis Line: [phone] call 911  Patient  verbalizes understanding of instructions and care plan provided today and agrees to view in MyChart. Active MyChart status and patient understanding of how to access instructions and care plan via MyChart confirmed with patient.     Telephone follow up appointment with care management team member scheduled for:  04/03/2023 at 3:30 pm.  Danford Bad, BSW, MSW, LCSW  Embedded Practice Social Work Case Manager  [facility], Population [facility] Direct Dial: [phone]  Fax: [phone] Email: [email or web address]

## 2023-03-22 ENCOUNTER — Ambulatory Visit (HOSPITAL_COMMUNITY): Payer: Medicare PPO

## 2023-03-22 DIAGNOSIS — M542 Cervicalgia: Secondary | ICD-10-CM | POA: Diagnosis not present

## 2023-03-22 NOTE — Therapy (Signed)
Marland Kitchen   OUTPATIENT PHYSICAL THERAPY CERVICAL EVALUATION   Patient Name: Steven [name] MRN: [id number] DOB:[birth year], 72 y.o., male Today's Date: 03/22/2023  END OF SESSION:  PT End of Session - 03/22/23 0741     Visit Number 2    Number of Visits 4    Date for PT Re-Evaluation 04/12/23    Authorization Type [employer]; 4 visits approved 03/15/23 to 04/12/23    Authorization - Visit Number 1    Authorization - Number of Visits 4    Progress Note Due on Visit 4    PT Start Time 0737    PT Stop Time 0815    PT Time Calculation (min) 38 min    Activity Tolerance Patient tolerated treatment well    Behavior During Therapy [facility] for tasks assessed/performed             Past Medical History:  Diagnosis Date   Anxiety    Arthritis    Atrial fibrillation (HCC)    Atrial flutter (HCC)    BPH (benign prostatic hyperplasia)    Chest pain    Dizziness    Dyspnea    laying down occ   Fracture 08/17/2015   MULTIPLE RIB FRACTURES     FROM FALL    GERD (gastroesophageal reflux disease)    Hemorrhoids    History of kidney stones    noted on CT scan   History of radiation therapy 08/22/11-10/13/11   prostate   Hyperlipemia    Hypertension    Hyperthyroidism    IBS (irritable bowel syndrome)    Insomnia    Light headedness    Migraine    Numbness and tingling in left arm    Numbness and tingling of both legs    Open fracture of left elbow 08/18/2015   Prostate cancer [facility])    prostate s/p radiation Mar 2013   Rib fractures 08/17/2015   Tinnitus    Past Surgical History:  Procedure Laterality Date   BOTOX INJECTION N/A 01/14/2020   Procedure: INJECTION OF BOTOX INTO ANAL SPHINCTER;  Surgeon: Andria Meuse, MD;  Location: [facility] ORS;  Service: General;  Laterality: N/A;   COLONOSCOPY N/A 12/19/2012   [id number] 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 Normal appearing colon   COLONOSCOPY WITH PROPOFOL N/A  10/23/2019   Procedure: COLONOSCOPY WITH PROPOFOL;  Surgeon: Corbin Ade, MD; external and grade 2 internal hemorrhoids, abnormal rectal blood vessels consistent with radiation proctitis s/p APC therapy, otherwise normal exam.   EVALUATION UNDER ANESTHESIA WITH ANAL FISTULECTOMY N/A 01/14/2020   Procedure: ANORECTAL EXAM UNDER ANESTHESIA;  Surgeon: Andria Meuse, MD;  Location: [facility] ORS;  Service: General;  Laterality: N/A;   HOT HEMOSTASIS  10/23/2019   Procedure: HOT HEMOSTASIS (ARGON PLASMA COAGULATION/BICAP);  Surgeon: Corbin Ade, MD;  Location: [facility] ENDO SUITE;  Service: Endoscopy;;  apc rectal proctitis     KIDNEY SURGERY  1982   kidney tube collapse repair   RADIOACTIVE SEED IMPLANT     Prostate   SVT ABLATION N/A 12/12/2022   Procedure: SVT ABLATION;  Surgeon: Marinus Maw, MD;  Location: [facility] INVASIVE CV LAB;  Service: Cardiovascular;  Laterality: N/A;   Patient Active Problem List   Diagnosis Date Noted   Mixed hyperlipidemia 03/14/2023   Prediabetes 03/14/2023   Diffuse pain 02/19/2023   Hypertensive urgency 11/24/2022   Leukopenia 11/24/2022   SVT (supraventricular tachycardia) 11/24/2022   Orthostatic hypotension 11/23/2022  Chest tightness 08/22/2022   Hypokalemia 08/21/2022   Calculus of gallbladder without cholecystitis without obstruction 08/09/2022   Secondary adrenocortical insufficiency (HCC) 07/19/2022   Rash and nonspecific skin eruption 05/10/2022   Dysphagia 05/10/2022   Chronic pain 07/07/2020   Paresthesia of upper limb 03/04/2020   Carcinoma of prostate (HCC) 02/17/2020   Subacute thyroiditis 01/07/2020   Personal history of malignant neoplasm of prostate 12/12/2019   Benign prostatic hyperplasia with urinary obstruction 12/12/2019   Weak urinary stream 12/12/2019   Hyperthyroidism 11/24/2019   Unintentional weight loss 10/21/2019   IBS (irritable bowel syndrome) 10/21/2019   Dizzy spells 09/20/2019   Intractable chronic migraine without aura  09/20/2019   Obstructive sleep apnea syndrome 09/20/2019   Psychophysiologic insomnia 09/20/2019   Rectal pain 08/21/2019   Nausea without vomiting 05/30/2019   Paroxysmal atrial flutter (HCC) 04/17/2019   Chronic atrial fibrillation (HCC) 03/02/2019   HTN (hypertension) 03/02/2019   Constipation 08/02/2017   Hemorrhoids 08/02/2017   Fall 08/18/2015   Radiation proctitis 01/30/2013   Rectal bleeding 12/10/2012   Obese 05/30/2012   Dyspnea 05/30/2012   Elevated lipids 05/30/2012   Palpitations 05/30/2012   Malignant neoplasm of prostate (HCC) 08/13/2011    PCP: William Hamburger, FNP  REFERRING PROVIDER: Bedelia Person, MD  REFERRING DIAG:  Diagnosis  [phone] (ICD-10-CM) - Spondylosis without myelopathy or radiculopathy, cervical region    THERAPY DIAG:  Cervicalgia  Neck pain  Rationale for Evaluation and Treatment: Rehabilitation  ONSET DATE: off and on for about a year  SUBJECTIVE:                                                                                                                                                                                                         SUBJECTIVE STATEMENT: Doing ok this morning; had an episode yesterday of almost passing out; wearing life alert.  This morning feels ok; stiffness in neck this morning; 4/10   Eval:Neck pain off and on for about a year; saw Dr. Maisie Fus who tells him that it's mostly arthritic changes and degenerative changes; sometimes has some numbness and tingling in hands. Reports he is also seeing a chiropractor Hand dominance: Right  PERTINENT HISTORY:  Heart ablation May 2024 had a fall at work in 2017   PAIN:  Are you having pain? Yes: NPRS scale: 0-5/10 Pain location: neck Pain description: stiff, tired, sore Aggravating factors: worse at night Relieving factors: change positions  PRECAUTIONS: None   WEIGHT BEARING RESTRICTIONS: No  FALLS:  Has patient fallen in last 6  months? No  OCCUPATION: retired  PLOF: Independent  PATIENT GOALS: relieve pain  NEXT MD VISIT: PRN  OBJECTIVE:   DIAGNOSTIC FINDINGS:  None recent in epic  PATIENT SURVEYS:  FOTO 61  COGNITION: Overall cognitive status: Within functional limits for tasks assessed  SENSATION: Some numbness and tingling in hands  POSTURE: rounded shoulders, forward head, and increased thoracic kyphosis  PALPATION: Tight bilateral upper traps R > L; general soreness cervical paraspinals   CERVICAL ROM:   Active ROM AROM (deg) eval  Flexion 51  Extension 18  Right lateral flexion 26  Left lateral flexion 32  Right rotation 50*  Left rotation 65   (Blank rows = not tested)  UPPER EXTREMITY ROM:  Active ROM Right eval Left eval  Shoulder flexion (sitting) 132   Shoulder extension    Shoulder abduction    Shoulder adduction    Shoulder extension    Shoulder internal rotation    Shoulder external rotation    Elbow flexion    Elbow extension    Wrist flexion    Wrist extension    Wrist ulnar deviation    Wrist radial deviation    Wrist pronation    Wrist supination     (Blank rows = not tested)  UPPER EXTREMITY MMT:  MMT Right eval Left eval  Shoulder flexion 5 4+  Shoulder extension    Shoulder abduction    Shoulder adduction    Shoulder extension    Shoulder internal rotation    Shoulder external rotation    Middle trapezius    Lower trapezius    Elbow flexion    Elbow extension    Wrist flexion    Wrist extension    Wrist ulnar deviation    Wrist radial deviation    Wrist pronation    Wrist supination    Grip strength     (Blank rows = not tested)  CERVICAL SPECIAL TESTS:  Distraction test: Negative  FUNCTIONAL TESTS:  5 times sit to stand: 15.78 using hands 1st repetition.  TODAY'S TREATMENT:                                                                                                                              DATE:  03/22/23  Review of  HEP and goals Seated  Cervical retractions x 10 Upper trap stretch 10" x 5 each Scapular retraction 2" hold x 10 Thoracic extension over back of chair x 10 STM to cervical paraspinals and upper traps to decrease pain and increase tissue extensibility x 8'; no other intervention performed during manual treatment.     03/15/23 physical therapy evaluation and HEP instruction   PATIENT EDUCATION:  Education details: Patient educated on exam findings, POC, scope of PT, HEP, and what to expect next visit. Person educated: Patient Education method: Explanation, Demonstration, and Handouts Education comprehension: verbalized understanding, returned demonstration, verbal cues required, and tactile cues required  HOME EXERCISE PROGRAM: 03/22/23 thoracic  extension, scapular retractions Access Code: [email or web address] URL: [email or web address] Date: 03/15/2023 Prepared by: [facility] - Rehab  Exercises - Seated Cervical Sidebending Stretch  - 2 x daily - 7 x weekly - 1 sets - 5 reps - 20 sec hold - Seated Cervical Retraction  - 2 x daily - 7 x weekly - 1 sets - 10 reps - Seated Correct Posture  - 1 x daily - 7 x weekly - 1 sets - 1 reps  ASSESSMENT:  CLINICAL IMPRESSION: Today's session started with a review of HEP and goals.  Patient verbalizes agreement with set rehab goals.  Added scapular retractions and thoracic extension today; felt some discomfort in low back with thoracic extension but minimal.  Updated HEP.  Added STM to cervical paraspinals and upper traps to decrease pain and increase soft tissue extensibility.  Patient will benefit from continued skilled therapy services to address deficits and promote return to optimal function.      Eval:Patient is a 72 y.o. male who was seen today for physical therapy evaluation and treatment for 47.812 (ICD-10-CM) - Spondylosis without myelopathy or radiculopathy, cervical region.  Patient demonstrates decreased strength, ROM restriction, reduced  flexibility, increased tenderness to palpation and postural abnormalities which are likely contributing to symptoms of pain and are negatively impacting patient ability to perform ADLs. Patient will benefit from skilled physical therapy services to address these deficits to reduce pain and improve level of function with ADLs   OBJECTIVE IMPAIRMENTS: decreased activity tolerance, decreased mobility, decreased ROM, decreased strength, hypomobility, increased fascial restrictions, impaired perceived functional ability, postural dysfunction, and pain.   ACTIVITY LIMITATIONS: carrying, lifting, sitting, standing, and reach over head  PARTICIPATION LIMITATIONS: meal prep, cleaning, laundry, driving, shopping, community activity, and yard work  [facility] POTENTIAL: Good  CLINICAL DECISION MAKING: Stable/uncomplicated  EVALUATION COMPLEXITY: Low   GOALS: Goals reviewed with patient? No  SHORT TERM GOALS: Target date: 03/29/2023  patient will be independent with initial HEP  Baseline:  Goal status: in progress  2.  Patient will self report 30% improvement to improve tolerance for functional activity   Baseline:  Goal status: in progress  LONG TERM GOALS: Target date: 04/12/2023  Patient will be independent in self management strategies to improve quality of life and functional outcomes Baseline:  Goal status: in progress   2.  Patient will self report 50% improvement to improve tolerance for functional activity   Baseline:  Goal status: in progress  3.  Patient will improve FOTO score by 5 points to demonstrate improved perceived functional mobility  Baseline: 61 Goal status: in progress  4.  Patient will improve cervical mobility by 20 degrees total to improve ability to scan for safety Baseline: see above Goal status: in progress  5.  Patient will demonstrate good sitting posture without cues x 15' to be able to perform short drives without increased neck pain Baseline:   Goal status: In progress   PLAN:  PT FREQUENCY: 1x/week  PT DURATION: 4 weeks  PLANNED INTERVENTIONS: Therapeutic exercises, Therapeutic activity, Neuromuscular re-education, Balance training, Gait training, Patient/Family education, Joint manipulation, Joint mobilization, Stair training, Orthotic/Fit training, DME instructions, Aquatic Therapy, Dry Needling, Electrical stimulation, Spinal manipulation, Spinal mobilization, Cryotherapy, Moist heat, Compression bandaging, scar mobilization, Splintting, Taping, Traction, Ultrasound, Ionotophoresis 4mg /ml Dexamethasone, and Manual therapy   PLAN FOR NEXT SESSION:would recommend 2 x a week but due to copay patient requests 1x a week.    cervical mobility, postural strengthening; manual as needed  8:00 AM, 03/22/23 Deacon Gadbois Small Burnadette Peter  MPT [facility] [place] [phone]

## 2023-03-29 ENCOUNTER — Encounter (HOSPITAL_COMMUNITY): Payer: Medicare PPO | Admitting: Physical Therapy

## 2023-03-29 ENCOUNTER — Telehealth: Payer: Self-pay | Admitting: Family Medicine

## 2023-03-29 DIAGNOSIS — K6289 Other specified diseases of anus and rectum: Secondary | ICD-10-CM | POA: Diagnosis not present

## 2023-03-29 DIAGNOSIS — K602 Anal fissure, unspecified: Secondary | ICD-10-CM | POA: Diagnosis not present

## 2023-03-29 DIAGNOSIS — K581 Irritable bowel syndrome with constipation: Secondary | ICD-10-CM | POA: Diagnosis not present

## 2023-03-29 DIAGNOSIS — F4000 Agoraphobia, unspecified: Secondary | ICD-10-CM | POA: Diagnosis not present

## 2023-03-29 DIAGNOSIS — F411 Generalized anxiety disorder: Secondary | ICD-10-CM | POA: Diagnosis not present

## 2023-03-29 NOTE — Telephone Encounter (Signed)
Patient stated that he has seen a RA specialist before and they told him he didn't need to come back unless he needs/wants to.

## 2023-03-29 NOTE — Telephone Encounter (Signed)
patient does not need this referral to [facility] for Rheumatology. Patient seen few weeks ago he said, they called patient asking about referral. Contact patient back patient needs to know why getting this call. [phone].

## 2023-03-30 ENCOUNTER — Ambulatory Visit (INDEPENDENT_AMBULATORY_CARE_PROVIDER_SITE_OTHER): Payer: Medicare PPO

## 2023-03-30 DIAGNOSIS — Z0000 Encounter for general adult medical examination without abnormal findings: Secondary | ICD-10-CM | POA: Diagnosis not present

## 2023-03-30 NOTE — Progress Notes (Signed)
Subjective:   Steven [name] is a 72 y.o. male who presents for Medicare Annual/Subsequent preventive examination.  Visit Complete: Virtual  I connected with  Steven [name] on 03/30/23 by a audio enabled telemedicine application and verified that I am speaking with the correct person using two identifiers.  Patient Location: Home  Provider Location: Office/Clinic  I discussed the limitations of evaluation and management by telemedicine. The patient expressed understanding and agreed to proceed.  Patient Medicare AWV questionnaire was completed by the patient on 03/30/2023; I have confirmed that all information answered by patient is correct and no changes since this date.  Vital Signs: Unable to obtain new vitals due to this being a telehealth visit.   Review of Systems     Steven [name] , Thank you for taking time to come for your Medicare Wellness Visit. I appreciate your ongoing commitment to your health goals. Please review the following plan we discussed and let me know if I can assist you in the future.   These are the goals we discussed:  Goals       Exercise 3x per week (30 min per time)      Manage Blood Pressure & Heart Rate/Rhythm      Care Coordination Goals: Patient will continue to monitor and record blood pressure and heart rate daily and will call PCP with readings outside of recommended range Patient will have that log available to discuss with RN Care Coordinator at each call Patient will follow a low sodium/DASH diet  Patient will move carefully and change positions slowly to decrease risk of orthostatic hypotension and falls Patient will reach out to RN Care Coordinator [phone] with any care coordination or resource needs        Manage Pain Effectively      Care Coordination Goals: Patient will schedule sooner appointment with PCP if needed Appt. In August was cancelled by patient and rescheduled for November Patient will talk with PCP about urgent care  recommendation to try Cymbalta. Will need to stop Remeron and consider a different sleep aid if starts Cymbalta Patient will work with physical therapy starting on 03/15/23 at [facility] Patient will reach out to provider with any new or worsening symptoms Patient will reach out to RN Care Coordinator at [phone] with any resource or care coordination needs      Patient Stated      I would like to feel better so that I can get back to doing things I used to do.      Receive Counseling & Supportive Services for Symptoms of Anxiety. (pt-stated)      Care Coordination Interventions:  Interventions Today    Flowsheet Row Most Recent Value  Chronic Disease   Chronic disease during today's visit Hypertension (HTN), Atrial Fibrillation (AFib), Other  [Uncontrolled Blood Pressure, Financial Insecurities & Anxiety]  General Interventions   General Interventions Discussed/Reviewed General Interventions Discussed, Labs, Vaccines, Doctor Visits, Health Screening, Annual Foot Exam, General Interventions Reviewed, Annual Eye Exam, Durable Medical Equipment (DME), [employer], Level of Care, Communication with  [Encouraged]  Labs Kidney Function  [Encouraged]  Vaccines COVID-19, Flu, Pneumonia, RSV, Tetanus/Pertussis/Diphtheria, Shingles  [Encouraged]  Doctor Visits Discussed/Reviewed Doctor Visits Discussed, Specialist, Doctor Visits Reviewed, Annual Wellness Visits, PCP  [Encouraged]  Health Screening Bone Density, Colonoscopy, Prostate  [Encouraged]  Durable Medical Equipment (DME) BP Cuff, Wheelchair  [Encouraged]  Wheelchair Standard  [Encouraged]  PCP/Specialist Visits Compliance with follow-up visit  [Encouraged]  Communication with RN,  PCP/Specialists  [Encouraged]  Level of Care Adult Daycare, Applications, Assisted Living, Personal Care Services  [Encouraged]  Applications Medicaid, Personal Care Services  [Encouraged]  Exercise Interventions   Exercise Discussed/Reviewed Exercise  Discussed, Assistive device use and maintanence, Exercise Reviewed, Physical Activity, Weight Managment  [Encouraged]  Physical Activity Discussed/Reviewed Physical Activity Discussed, Home Exercise Program (HEP), Physical Activity Reviewed, Types of exercise  [Encouraged]  Weight Management Weight loss  [Encouraged]  Education Interventions   Education Provided Provided Therapist, sports, Provided Web-based Education, Provided Education  [Encouraged]  Provided Verbal Education On Nutrition, Mental Health/Coping with Illness, When to see the doctor, Foot Care, Eye Care, Labs, Applications, Exercise, Medication, Development worker, community, [employer]  [Encouraged]  Labs Reviewed Kidney Function, Lipid Profile  [Encouraged]  Applications Medicaid, Personal Care Services  [Encouraged]  Mental Health Interventions   Mental Health Discussed/Reviewed Mental Health Discussed, Anxiety, Depression, Mental Health Reviewed, Grief and Loss, Substance Abuse, Coping Strategies, Suicide, Crisis, Other  [Domestic Violence]  Nutrition Interventions   Nutrition Discussed/Reviewed Nutrition Discussed, Adding fruits and vegetables, Increasing proteins, Decreasing fats, Decreasing salt, Nutrition Reviewed, Fluid intake, Carbohydrate meal planning, Portion sizes, Decreasing sugar intake  [Encouraged]  Pharmacy Interventions   Pharmacy Dicussed/Reviewed Pharmacy Topics Discussed, Medications and their functions, Medication Adherence, Pharmacy Topics Reviewed, Affording Medications  [Encouraged]  Safety Interventions   Safety Discussed/Reviewed Safety Discussed, Safety Reviewed, Fall Risk, Home Safety  [Encouraged]  Home Safety Assistive Devices, Need for home safety assessment, Refer for community resources  [Encouraged]  Advanced Directive Interventions   Advanced Directives Discussed/Reviewed Advanced Directives Discussed  [Encouraged]      Active Listening & Reflection Utilized.  Verbalization of Feelings  Encouraged.  Emotional Support Provided. Problem Solving Interventions Activated. Task-Centered Solutions Indicated.   Solution-Focused Strategies Employed. Acceptance & Commitment Therapy Initiated. Cognitive Behavioral Therapy Performed. Congratulations on Approval for Financial Assistance through [facility] & 50% of Medical Expenses Written Off, Via [facility] Financial Assistance Program. CSW Collaboration with Rica Records, Family Nurse Practitioner with [facility] [phone]), Via Secure Chat Message in Epic, to Request Referral to Pain Management Clinic, Per Your Request. Encouraged Continued Engagement with Physical Therapist, [place] Organ with [facility], to Improve Strengthening, Conditioning, Mobility, Safety, Pain Control, Etc. Encouraged Self-Enrollment with Psychiatrist of Interest in [facility], from List Provided, in An Effort to Receive Psychotropic Medication Administration & Management, to Reduce & Manage Symptoms of Anxiety. Encouraged Self-Enrollment with Therapist of Interest in [facility], from List Provided, in An Effort to Receive Psychotherapeutic Counseling & Supportive Services, to Reduce & Manage Symptoms of Anxiety. Encouraged Contact with CSW (# [phone]), if You Have Questions, Need Assistance, or If Additional Social Work Needs Are Identified Between Now & Our Next Scheduled Follow-Up Outreach Call.        This is a list of the screening recommended for you and due dates:  Health Maintenance  Topic Date Due   DTaP/Tdap/Td vaccine (1 - Tdap) Never done   Zoster (Shingles) Vaccine (1 of 2) Never done   Pneumonia Vaccine (1 of 1 - PCV) Never done   COVID-19 Vaccine (3 - Moderna risk series) 11/09/2019   Flu Shot  02/22/2023   Screening for Lung Cancer  06/23/2023   Medicare Annual Wellness Visit  03/29/2024   Colon Cancer Screening  04/26/2030    Hepatitis C Screening  Completed   HPV Vaccine  Aged Out    Cardiac Risk Factors include: advanced age (>39men, >  65 women);male gender;hypertension;obesity (BMI >30kg/m2)     Objective:    There were no vitals filed for this visit. There is no height or weight on file to calculate BMI.     03/30/2023    8:47 AM 03/15/2023    7:43 AM 01/08/2023    6:27 PM 01/07/2023    7:45 PM 12/21/2022   11:16 AM 12/08/2022    3:54 PM 12/04/2022    8:43 AM  Advanced Directives  Does Patient Have a Medical Advance Directive? No No No No No No No  Would patient like information on creating a medical advance directive?  No - Patient declined No - Patient declined No - Patient declined No - Patient declined No - Patient declined No - Patient declined    Current Medications (verified) Outpatient Encounter Medications as of 03/30/2023  Medication Sig   acetaminophen (TYLENOL) 500 MG tablet Take 500 mg by mouth every 6 (six) hours as needed for moderate pain.   alfuzosin (UROXATRAL) 10 MG 24 hr tablet Take 1 tablet (10 mg total) by mouth daily with breakfast.   amLODipine (NORVASC) 5 MG tablet Take 1 tablet (5 mg total) by mouth daily.   Azelastine-Fluticasone 137-50 MCG/ACT SUSP Place 1 spray into the nose every 12 (twelve) hours.   Cholecalciferol (VITAMIN D3) 125 MCG (5000 UT) CAPS Take 1 capsule by mouth daily.    cloNIDine (CATAPRES) 0.1 MG tablet Take 1 tablet (0.1 mg total) by mouth 2 (two) times daily as needed (SBP>160). (Patient taking differently: Take 0.1 mg by mouth 3 (three) times daily.)   ELIQUIS 5 MG TABS tablet TAKE 1 TABLET(5 MG) BY MOUTH TWICE DAILY   furosemide (LASIX) 20 MG tablet Take 1 tablet (20 mg total) by mouth daily. Take with or after meal.   gabapentin (NEURONTIN) 300 MG capsule Take 1 capsule (300 mg total) by mouth 3 (three) times daily.   Galcanezumab-gnlm (EMGALITY) 120 MG/ML SOAJ Inject 1 Pen into the skin every 30 (thirty) days.   hydrOXYzine (ATARAX) 10 MG tablet Take 1  tablet (10 mg total) by mouth 3 (three) times daily as needed for anxiety.   hyoscyamine (LEVBID) 0.375 MG 12 hr tablet Take 0.375 mg by mouth 2 (two) times daily.   loratadine (CLARITIN) 10 MG tablet Take 1 tablet (10 mg total) by mouth daily.   losartan (COZAAR) 100 MG tablet Take 100 mg by mouth daily.   methimazole (TAPAZOLE) 5 MG tablet Take 0.5 tablets (2.5 mg total) by mouth daily.   mirtazapine (REMERON) 7.5 MG tablet Take 1 tablet (7.5 mg total) by mouth at bedtime.   NURTEC 75 MG TBDP Take 75 mg by mouth daily as needed (pain).   ondansetron (ZOFRAN) 4 MG tablet Take 1 tablet (4 mg total) by mouth every 8 (eight) hours as needed for nausea or vomiting.   polyethylene glycol (MIRALAX / GLYCOLAX) 17 g packet Take 17 g by mouth daily.   potassium chloride SA (KLOR-CON M) 20 MEQ tablet Take 1 tablet (20 mEq total) by mouth daily.   Probiotic Product (PROBIOTIC-10 PO) Take 1 capsule by mouth daily.   Ubrogepant (UBRELVY) 100 MG TABS Take 1 tablet (100 mg total) by mouth as needed (for headaches). May repeat a dose in 2 hours if needed. Max dose 2 pills in 24 hours   vitamin B-12 (CYANOCOBALAMIN) 100 MCG tablet Take 100 mcg by mouth daily.   metoprolol tartrate (LOPRESSOR) 25 MG tablet Take 1 tablet (25 mg total) by mouth 2 (two)  times daily. TAKE EXTRA HALF TABLET  AS NEEDED   [DISCONTINUED] gabapentin (NEURONTIN) 300 MG capsule Take 1 capsule (300 mg total) by mouth 3 (three) times daily as needed.   No facility-administered encounter medications on file as of 03/30/2023.    Allergies (verified) Latex and Meclizine   History: Past Medical History:  Diagnosis Date   Anxiety    Arthritis    Atrial fibrillation (HCC)    Atrial flutter (HCC)    BPH (benign prostatic hyperplasia)    Chest pain    Dizziness    Dyspnea    laying down occ   Fracture 08/17/2015   MULTIPLE RIB FRACTURES     FROM FALL    GERD (gastroesophageal reflux disease)    Hemorrhoids    History of kidney stones     noted on CT scan   History of radiation therapy 08/22/11-10/13/11   prostate   Hyperlipemia    Hypertension    Hyperthyroidism    IBS (irritable bowel syndrome)    Insomnia    Light headedness    Migraine    Numbness and tingling in left arm    Numbness and tingling of both legs    Open fracture of left elbow 08/18/2015   Prostate cancer [facility])    prostate s/p radiation Mar 2013   Rib fractures 08/17/2015   Tinnitus    Past Surgical History:  Procedure Laterality Date   BOTOX INJECTION N/A 01/14/2020   Procedure: INJECTION OF BOTOX INTO ANAL SPHINCTER;  Surgeon: Andria Meuse, MD;  Location: [facility] ORS;  Service: General;  Laterality: N/A;   COLONOSCOPY N/A 12/19/2012   [id number] 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 Normal appearing colon   COLONOSCOPY WITH PROPOFOL N/A 10/23/2019   Procedure: COLONOSCOPY WITH PROPOFOL;  Surgeon: Corbin Ade, MD; external and grade 2 internal hemorrhoids, abnormal rectal blood vessels consistent with radiation proctitis s/p APC therapy, otherwise normal exam.   EVALUATION UNDER ANESTHESIA WITH ANAL FISTULECTOMY N/A 01/14/2020   Procedure: ANORECTAL EXAM UNDER ANESTHESIA;  Surgeon: Andria Meuse, MD;  Location: [facility] ORS;  Service: General;  Laterality: N/A;   HOT HEMOSTASIS  10/23/2019   Procedure: HOT HEMOSTASIS (ARGON PLASMA COAGULATION/BICAP);  Surgeon: Corbin Ade, MD;  Location: [facility] ENDO SU[facility];  Service: Endoscopy;;  apc rectal proctitis     KIDNEY SURGERY  1982   kidney tube collapse repair   RADIOACTIVE SEED IMPLANT     Prostate   SVT ABLATION N/A 12/12/2022   Procedure: SVT ABLATION;  Surgeon: Marinus Maw, MD;  Location: [facility] INVASIVE CV LAB;  Service: Cardiovascular;  Laterality: N/A;   Family History  Problem Relation Age of Onset   Aneurysm Mother        aortic   Hypertension Mother    Headache Mother    Prostate cancer Father    Hypertension Father     Colon cancer Neg Hx    Colon polyps Neg Hx    Social History   Socioeconomic History   Marital status: Single    Spouse name: Not on file   Number of children: 2   Years of education: 77   Highest education level: 12th grade  Occupational History   Occupation: Custodian     Employer: [employer]  Tobacco Use   Smoking status: Former    Current packs/day: 0.00    Average packs/day: 1 pack/day for 20.0 years (20.0 ttl pk-yrs)  Types: Cigarettes    Start date: 2000    Quit date: 2020    Years since quitting: 4.6    Passive exposure: Past   Smokeless tobacco: Never   Tobacco comments:    Quit smoking x 2 years    07/2015   SOMETIIMES i USE VAPOR     Smoking Cessation Classes, Agencies, Public librarian  Vaping Use   Vaping status: Never Used  Substance and Sexual Activity   Alcohol use: Never   Drug use: Never   Sexual activity: Not Currently    Partners: Female  Other Topics Concern   Not on file  Social History Narrative   Lives with friend   Divorced   No caffeine   Social Determinants of Health   Financial Resource Strain: Low Risk  (03/30/2023)   Overall Financial Resource Strain (CARDIA)    Difficulty of Paying Living Expenses: Not very hard  Recent Concern: Financial Resource Strain - Medium Risk (01/08/2023)   Overall Financial Resource Strain (CARDIA)    Difficulty of Paying Living Expenses: Somewhat hard  Food Insecurity: No Food Insecurity (03/30/2023)   Hunger Vital Sign    Worried About Running Out of Food in the Last Year: Never true    Ran Out of Food in the Last Year: Never true  Transportation Needs: No Transportation Needs (03/30/2023)   PRAPARE - Administrator, Civil Service (Medical): No    Lack of Transportation (Non-Medical): No  Physical Activity: Sufficiently Active (03/30/2023)   Exercise Vital Sign    Days of Exercise per Week: 6 days    Minutes of Exercise per Session: 60 min  Recent Concern: Physical  Activity - Inactive (03/12/2023)   Exercise Vital Sign    Days of Exercise per Week: 0 days    Minutes of Exercise per Session: 0 min  Stress: No Stress Concern Present (03/30/2023)   [employer] of Occupational Health - Occupational Stress Questionnaire    Feeling of Stress : Only a little  Recent Concern: Stress - Stress Concern Present (01/08/2023)   [employer] of Occupational Health - Occupational Stress Questionnaire    Feeling of Stress : Rather much  Social Connections: Moderately Integrated (03/30/2023)   Social Connection and Isolation Panel [NHANES]    Frequency of Communication with Friends and Family: More than three times a week    Frequency of Social Gatherings with Friends and Family: More than three times a week    Attends Religious Services: 1 to 4 times per year    Active Member of [employer] or Organizations: No    Attends Banker Meetings: Never    Marital Status: Living with partner    Tobacco Counseling Counseling given: Not Answered Tobacco comments: Quit smoking x 2 years    07/2015   SOMETIIMES i USE VAPOR  Smoking Cessation Classes, Agencies, Services & Resources Offered   Clinical Intake:     Pain : No/denies pain     BMI - recorded: 30.53 Nutritional Status: BMI > 30  Obese Nutritional Risks: None Diabetes: No  How often do you need to have someone help you when you read instructions, pamphlets, or other written materials from your doctor or pharmacy?: 1 - Never What is the last grade level you completed in school?: 12th grade  Interpreter Needed?: No      Activities of Daily Living    03/30/2023    8:43 AM 01/08/2023    6:28 PM  In your  present state of health, do you have any difficulty performing the following activities:  Hearing? 0 0  Vision? 0 0  Difficulty concentrating or making decisions? 0 0  Walking or climbing stairs? 0 0  Dressing or bathing? 0 0  Doing errands, shopping? 0 0  Preparing Food and eating ? N  N  Using the Toilet? N N  In the past six months, have you accidently leaked urine? N N  Do you have problems with loss of bowel control? N N  Managing your Medications? N N  Managing your Finances? N N  Housekeeping or managing your Housekeeping? N N    Patient Care Team: Del Newman Nip, Tenna Child, FNP as PCP - General (Family Medicine) Wendall Stade, MD as PCP - Cardiology (Cardiology) Marinus Maw, MD as PCP - Electrophysiology (Cardiology) Jena Gauss Gerrit Friends, MD as Attending Physician (Gastroenterology) Saporito, Fanny Dance, LCSW as [facility] Management (Licensed Clinical Social Worker) Clinton Gallant, RN as [facility] Care Management  Indicate any recent Medical Services you may have received from other than [facility] providers in the past year (date may be approximate).     Assessment:   This is a routine wellness examination for Steven [name].  Hearing/Vision screen No results found.   Goals Addressed             This Visit's Progress    Patient Stated   On track    I would like to feel better so that I can get back to doing things I used to do.      Depression Screen    03/30/2023    8:48 AM 03/30/2023    8:47 AM 02/19/2023    1:31 PM 02/01/2023    8:11 AM 01/24/2023    8:18 AM 01/08/2023    6:25 PM 11/03/2020   10:22 AM  PHQ 2/9 Scores  PHQ - 2 Score 2 2 2 2 2  0 0  PHQ- 9 Score   4 5 5   0    Fall Risk    03/30/2023    8:47 AM 02/19/2023    1:31 PM 02/01/2023    8:11 AM 01/24/2023    8:18 AM 01/08/2023    6:27 PM  Fall Risk   Falls in the past year? 0 0 0 0 1  Number falls in past yr: 0 0 0 0 1  Injury with Fall? 0 0 0 0 1  Risk for fall due to : No Fall Risks No Fall Risks No Fall Risks No Fall Risks History of fall(s);Impaired balance/gait;Impaired mobility  Follow up Falls evaluation completed Falls evaluation completed Falls evaluation completed Falls evaluation completed Falls evaluation completed;Education provided;Falls prevention  discussed    MEDICARE RISK AT HOME: Medicare Risk at Home Any stairs in or around the home?: No If so, are there any without handrails?: No Home free of loose throw rugs in walkways, pet beds, electrical cords, etc?: Yes Adequate lighting in your home to reduce risk of falls?: Yes Life alert?: No Use of a cane, walker or w/c?: No Grab bars in the bathroom?: Yes Shower chair or bench in shower?: No Elevated toilet seat or a handicapped toilet?: No  TIMED UP AND GO:  Was the test performed?  No    Cognitive Function:        03/30/2023    8:49 AM  6CIT Screen  What Year? 0 points  What month? 0 points  What time? 0 points  Count  back from 20 0 points  Months in reverse 0 points  Repeat phrase 2 points  Total Score 2 points    Immunizations Immunization History  Administered Date(s) Administered   Influenza,inj,quad, With Preservative 04/03/2019   Influenza-Unspecified 05/26/2020   Moderna Sars-Covid-2 Vaccination 09/07/2019, 10/12/2019    TDAP status: Due, Education has been provided regarding the importance of this vaccine. Advised may receive this vaccine at local pharmacy or Health Dept. Aware to provide a copy of the vaccination record if obtained from local pharmacy or Health Dept. Verbalized acceptance and understanding.  Flu Vaccine status: Due, Education has been provided regarding the importance of this vaccine. Advised may receive this vaccine at local pharmacy or Health Dept. Aware to provide a copy of the vaccination record if obtained from local pharmacy or Health Dept. Verbalized acceptance and understanding.  Pneumococcal vaccine status: Due, Education has been provided regarding the importance of this vaccine. Advised may receive this vaccine at local pharmacy or Health Dept. Aware to provide a copy of the vaccination record if obtained from local pharmacy or Health Dept. Verbalized acceptance and understanding.  Covid-19 vaccine status: Completed  vaccines  Qualifies for Shingles Vaccine? Yes   Zostavax completed Yes   Shingrix Completed?: No.    Education has been provided regarding the importance of this vaccine. Patient has been advised to call insurance company to determine out of pocket expense if they have not yet received this vaccine. Advised may also receive vaccine at local pharmacy or Health Dept. Verbalized acceptance and understanding.  Screening Tests Health Maintenance  Topic Date Due   DTaP/Tdap/Td (1 - Tdap) Never done   Zoster Vaccines- Shingrix (1 of 2) Never done   Pneumonia Vaccine 50+ Years old (1 of 1 - PCV) Never done   COVID-19 Vaccine (3 - Moderna risk series) 11/09/2019   INFLUENZA VACCINE  02/22/2023   Lung Cancer Screening  06/23/2023   Medicare Annual Wellness (AWV)  03/29/2024   Colonoscopy  04/26/2030   Hepatitis C Screening  Completed   HPV VACCINES  Aged Out    Health Maintenance  Health Maintenance Due  Topic Date Due   DTaP/Tdap/Td (1 - Tdap) Never done   Zoster Vaccines- Shingrix (1 of 2) Never done   Pneumonia Vaccine 59+ Years old (1 of 1 - PCV) Never done   COVID-19 Vaccine (3 - Moderna risk series) 11/09/2019   INFLUENZA VACCINE  02/22/2023    Colorectal cancer screening: Type of screening: Colonoscopy. Completed 04/26/2020. Repeat every 10 years  Lung Cancer Screening: (Low Dose CT Chest recommended if Age 89-80 years, 20 pack-year currently smoking OR have quit w/in 15years.) does qualify.   Lung Cancer Screening Referral:   Additional Screening:  Hepatitis C Screening: does qualify; Completed 01/29/2023   Vision Screening: Recommended annual ophthalmology exams for early detection of glaucoma and other disorders of the eye. Is the patient up to date with their annual eye exam?  Yes  Who is the provider or what is the name of the office in which the patient attends annual eye exams? [place] If pt is not established with a provider, would they like to be referred to a  provider to establish care? No .   Dental Screening: Recommended annual dental exams for proper oral hygiene  Diabetic Foot Exam: N/A  Community Resource Referral / Chronic Care Management: CRR required this visit?  No   CCM required this visit?  No     Plan:     I have  personally reviewed and noted the following in the patient's chart:   Medical and social history Use of alcohol, tobacco or illicit drugs  Current medications and supplements including opioid prescriptions. Patient is not currently taking opioid prescriptions. Functional ability and status Nutritional status Physical activity Advanced directives List of other physicians Hospitalizations, surgeries, and ER visits in previous 12 months Vitals Screenings to include cognitive, depression, and falls Referrals and appointments  In addition, I have reviewed and discussed with patient certain preventive protocols, quality metrics, and best practice recommendations. A written personalized care plan for preventive services as well as general preventive health recommendations were provided to patient.     Telford Nab, CMA   03/30/2023   After Visit Summary: (Mail) Due to this being a telephonic visit, the after visit summary with patients personalized plan was offered to patient via mail   Nurse Notes:  Steven [name] , Thank you for taking time to come for your Medicare Wellness Visit. I appreciate your ongoing commitment to your health goals. Please review the following plan we discussed and let me know if I can assist you in the future.   These are the goals we discussed:  Goals       Exercise 3x per week (30 min per time)      Manage Blood Pressure & Heart Rate/Rhythm      Care Coordination Goals: Patient will continue to monitor and record blood pressure and heart rate daily and will call PCP with readings outside of recommended range Patient will have that log available to discuss with RN Care Coordinator  at each call Patient will follow a low sodium/DASH diet  Patient will move carefully and change positions slowly to decrease risk of orthostatic hypotension and falls Patient will reach out to RN Care Coordinator [phone] with any care coordination or resource needs        Manage Pain Effectively      Care Coordination Goals: Patient will schedule sooner appointment with PCP if needed Appt. In August was cancelled by patient and rescheduled for November Patient will talk with PCP about urgent care recommendation to try Cymbalta. Will need to stop Remeron and consider a different sleep aid if starts Cymbalta Patient will work with physical therapy starting on 03/15/23 at [facility] Patient will reach out to provider with any new or worsening symptoms Patient will reach out to RN Care Coordinator at [phone] with any resource or care coordination needs      Patient Stated      I would like to feel better so that I can get back to doing things I used to do.      Receive Counseling & Supportive Services for Symptoms of Anxiety. (pt-stated)      Care Coordination Interventions:  Interventions Today    Flowsheet Row Most Recent Value  Chronic Disease   Chronic disease during today's visit Hypertension (HTN), Atrial Fibrillation (AFib), Other  [Uncontrolled Blood Pressure, Financial Insecurities & Anxiety]  General Interventions   General Interventions Discussed/Reviewed General Interventions Discussed, Labs, Vaccines, Doctor Visits, Health Screening, Annual Foot Exam, General Interventions Reviewed, Annual Eye Exam, Durable Medical Equipment (DME), [employer], Level of Care, Communication with  [Encouraged]  Labs Kidney Function  [Encouraged]  Vaccines COVID-19, Flu, Pneumonia, RSV, Tetanus/Pertussis/Diphtheria, Shingles  [Encouraged]  Doctor Visits Discussed/Reviewed Doctor Visits Discussed, Specialist, Doctor Visits Reviewed, Annual Wellness Visits, PCP  [Encouraged]   Health Screening Bone Density, Colonoscopy, Prostate  [Encouraged]  Durable Medical Equipment (DME) BP Cuff,  Wheelchair  [Encouraged]  Wheelchair Standard  [Encouraged]  PCP/Specialist Visits Compliance with follow-up visit  [Encouraged]  Communication with RN, PCP/Specialists  [Encouraged]  Level of Care Adult Daycare, Applications, Assisted Living, Personal Care Services  [Encouraged]  Applications Medicaid, Personal Care Services  [Encouraged]  Exercise Interventions   Exercise Discussed/Reviewed Exercise Discussed, Assistive device use and maintanence, Exercise Reviewed, Physical Activity, Weight Managment  [Encouraged]  Physical Activity Discussed/Reviewed Physical Activity Discussed, Home Exercise Program (HEP), Physical Activity Reviewed, Types of exercise  [Encouraged]  Weight Management Weight loss  [Encouraged]  Education Interventions   Education Provided Provided Therapist, sports, Provided Web-based Education, Provided Education  [Encouraged]  Provided Verbal Education On Nutrition, Mental Health/Coping with Illness, When to see the doctor, Foot Care, Eye Care, Labs, Applications, Exercise, Medication, Development worker, community, [employer] Resources  [Encouraged]  Labs Reviewed Kidney Function, Lipid Profile  [Encouraged]  Applications Medicaid, Personal Care Services  [Encouraged]  Mental Health Interventions   Mental Health Discussed/Reviewed Mental Health Discussed, Anxiety, Depression, Mental Health Reviewed, Grief and Loss, Substance Abuse, Coping Strategies, Suicide, Crisis, Other  [Domestic Violence]  Nutrition Interventions   Nutrition Discussed/Reviewed Nutrition Discussed, Adding fruits and vegetables, Increasing proteins, Decreasing fats, Decreasing salt, Nutrition Reviewed, Fluid intake, Carbohydrate meal planning, Portion sizes, Decreasing sugar intake  [Encouraged]  Pharmacy Interventions   Pharmacy Dicussed/Reviewed Pharmacy Topics Discussed, Medications and their functions,  Medication Adherence, Pharmacy Topics Reviewed, Affording Medications  [Encouraged]  Safety Interventions   Safety Discussed/Reviewed Safety Discussed, Safety Reviewed, Fall Risk, Home Safety  [Encouraged]  Home Safety Assistive Devices, Need for home safety assessment, Refer for community resources  [Encouraged]  Advanced Directive Interventions   Advanced Directives Discussed/Reviewed Advanced Directives Discussed  [Encouraged]      Active Listening & Reflection Utilized.  Verbalization of Feelings Encouraged.  Emotional Support Provided. Problem Solving Interventions Activated. Task-Centered Solutions Indicated.   Solution-Focused Strategies Employed. Acceptance & Commitment Therapy Initiated. Cognitive Behavioral Therapy Performed. Congratulations on Approval for Financial Assistance through [facility] & 50% of Medical Expenses Written Off, Via [facility] Health Financial Assistance Program. CSW Collaboration with Rica Records, Family Nurse Practitioner with [facility] [phone]), Via Secure Chat Message in Epic, to Request Referral to Pain Management Clinic, Per Your Request. Encouraged Continued Engagement with Physical Therapist, [place] Organ with [facility], to Improve Strengthening, Conditioning, Mobility, Safety, Pain Control, Etc. Encouraged Self-Enrollment with Psychiatrist of Interest in [facility], from List Provided, in An Effort to Receive Psychotropic Medication Administration & Management, to Reduce & Manage Symptoms of Anxiety. Encouraged Self-Enrollment with Therapist of Interest in [facility], from List Provided, in An Effort to Receive Psychotherapeutic Counseling & Supportive Services, to Reduce & Manage Symptoms of Anxiety. Encouraged Contact with CSW (# [phone]), if You Have Questions, Need Assistance, or If Additional Social Work Needs Are Identified Between Now &  Our Next Scheduled Follow-Up Outreach Call.        This is a list of the screening recommended for you and due dates:  Health Maintenance  Topic Date Due   DTaP/Tdap/Td vaccine (1 - Tdap) Never done   Zoster (Shingles) Vaccine (1 of 2) Never done   Pneumonia Vaccine (1 of 1 - PCV) Never done   COVID-19 Vaccine (3 - Moderna risk series) 11/09/2019   Flu Shot  02/22/2023   Screening for Lung Cancer  06/23/2023   Medicare Annual Wellness Visit  03/29/2024   Colon Cancer Screening  04/26/2030   Hepatitis C  Screening  Completed   HPV Vaccine  Aged Out

## 2023-03-30 NOTE — Patient Instructions (Signed)
Health Maintenance, Male Adopting a healthy lifestyle and getting preventive care are important in promoting health and wellness. Ask your health care provider about: The right schedule for you to have regular tests and exams. Things you can do on your own to prevent diseases and keep yourself healthy. What should I know about diet, weight, and exercise? Eat a healthy diet  Eat a diet that includes plenty of vegetables, fruits, low-fat dairy products, and lean protein. Do not eat a lot of foods that are high in solid fats, added sugars, or sodium. Maintain a healthy weight Body mass index (BMI) is a measurement that can be used to identify possible weight problems. It estimates body fat based on height and weight. Your health care provider can help determine your BMI and help you achieve or maintain a healthy weight. Get regular exercise Get regular exercise. This is one of the most important things you can do for your health. Most adults should: Exercise for at least 150 minutes each week. The exercise should increase your heart rate and make you sweat (moderate-intensity exercise). Do strengthening exercises at least twice a week. This is in addition to the moderate-intensity exercise. Spend less time sitting. Even light physical activity can be beneficial. Watch cholesterol and blood lipids Have your blood tested for lipids and cholesterol at 71 years of age, then have this test every 5 years. You may need to have your cholesterol levels checked more often if: Your lipid or cholesterol levels are high. You are older than 71 years of age. You are at high risk for heart disease. What should I know about cancer screening? Many types of cancers can be detected early and may often be prevented. Depending on your health history and family history, you may need to have cancer screening at various ages. This may include screening for: Colorectal cancer. Prostate cancer. Skin cancer. Lung  cancer. What should I know about heart disease, diabetes, and high blood pressure? Blood pressure and heart disease High blood pressure causes heart disease and increases the risk of stroke. This is more likely to develop in people who have high blood pressure readings or are overweight. Talk with your health care provider about your target blood pressure readings. Have your blood pressure checked: Every 3-5 years if you are 18-39 years of age. Every year if you are 40 years old or older. If you are between the ages of 65 and 75 and are a current or former smoker, ask your health care provider if you should have a one-time screening for abdominal aortic aneurysm (AAA). Diabetes Have regular diabetes screenings. This checks your fasting blood sugar level. Have the screening done: Once every three years after age 45 if you are at a normal weight and have a low risk for diabetes. More often and at a younger age if you are overweight or have a high risk for diabetes. What should I know about preventing infection? Hepatitis B If you have a higher risk for hepatitis B, you should be screened for this virus. Talk with your health care provider to find out if you are at risk for hepatitis B infection. Hepatitis C Blood testing is recommended for: Everyone born from 1945 through 1965. Anyone with known risk factors for hepatitis C. Sexually transmitted infections (STIs) You should be screened each year for STIs, including gonorrhea and chlamydia, if: You are sexually active and are younger than 71 years of age. You are older than 71 years of age and your   health care provider tells you that you are at risk for this type of infection. Your sexual activity has changed since you were last screened, and you are at increased risk for chlamydia or gonorrhea. Ask your health care provider if you are at risk. Ask your health care provider about whether you are at high risk for HIV. Your health care provider  may recommend a prescription medicine to help prevent HIV infection. If you choose to take medicine to prevent HIV, you should first get tested for HIV. You should then be tested every 3 months for as long as you are taking the medicine. Follow these instructions at home: Alcohol use Do not drink alcohol if your health care provider tells you not to drink. If you drink alcohol: Limit how much you have to 0-2 drinks a day. Know how much alcohol is in your drink. In the [place] one drink equals one 12 oz bottle of beer (355 mL), one 5 oz glass of wine (148 mL), or one 1 oz glass of hard liquor (44 mL). Lifestyle Do not use any products that contain nicotine or tobacco. These products include cigarettes, chewing tobacco, and vaping devices, such as e-cigarettes. If you need help quitting, ask your health care provider. Do not use street drugs. Do not share needles. Ask your health care provider for help if you need support or information about quitting drugs. General instructions Schedule regular health, dental, and eye exams. Stay current with your vaccines. Tell your health care provider if: You often feel depressed. You have ever been abused or do not feel safe at home. Summary Adopting a healthy lifestyle and getting preventive care are important in promoting health and wellness. Follow your health care provider's instructions about healthy diet, exercising, and getting tested or screened for diseases. Follow your health care provider's instructions on monitoring your cholesterol and blood pressure. This information is not intended to replace advice given to you by your health care provider. Make sure you discuss any questions you have with your health care provider. Document Revised: 11/29/2020 Document Reviewed: 11/29/2020 Elsevier Patient Education  2024 Elsevier Inc.  

## 2023-04-03 ENCOUNTER — Encounter: Payer: Self-pay | Admitting: *Deleted

## 2023-04-03 ENCOUNTER — Telehealth: Payer: Self-pay | Admitting: Psychiatry

## 2023-04-03 ENCOUNTER — Ambulatory Visit: Payer: Self-pay | Admitting: *Deleted

## 2023-04-03 NOTE — Patient Instructions (Signed)
Visit Information  Thank you for taking time to visit with me today. Please don't hesitate to contact me if I can be of assistance to you.   Following are the goals we discussed today:   Goals Addressed               This Visit's Progress     Receive Counseling & Supportive Services for Symptoms of Anxiety. (pt-stated)   On track     Care Coordination Interventions:  Interventions Today    Flowsheet Row Most Recent Value  Chronic Disease   Chronic disease during today's visit Hypertension (HTN), Atrial Fibrillation (AFib), Other  [Uncontrolled Blood Pressure, Financial Insecurities & Anxiety]  General Interventions   General Interventions Discussed/Reviewed General Interventions Discussed, Labs, Vaccines, Doctor Visits, Health Screening, Annual Foot Exam, General Interventions Reviewed, Annual Eye Exam, Durable Medical Equipment (DME), [employer], Level of Care, Communication with  [Encouraged]  Labs Kidney Function  [Encouraged]  Vaccines COVID-19, Flu, Pneumonia, RSV, Tetanus/Pertussis/Diphtheria, Shingles  [Encouraged]  Doctor Visits Discussed/Reviewed Doctor Visits Discussed, Specialist, Doctor Visits Reviewed, Annual Wellness Visits, PCP  [Encouraged]  Health Screening Bone Density, Colonoscopy, Prostate  [Encouraged]  Durable Medical Equipment (DME) BP Cuff, Wheelchair  [Encouraged]  Wheelchair Standard  [Encouraged]  PCP/Specialist Visits Compliance with follow-up visit  [Encouraged]  Communication with RN, PCP/Specialists  [Encouraged]  Level of Care Adult Daycare, Applications, Assisted Living, Personal Care Services  [Encouraged]  Applications Medicaid, Personal Care Services  [Encouraged]  Exercise Interventions   Exercise Discussed/Reviewed Exercise Discussed, Assistive device use and maintanence, Exercise Reviewed, Physical Activity, Weight Managment  [Encouraged]  Physical Activity Discussed/Reviewed Physical Activity Discussed, Home Exercise Program (HEP),  Physical Activity Reviewed, Types of exercise  [Encouraged]  Weight Management Weight loss  [Encouraged]  Education Interventions   Education Provided Provided Therapist, sports, Provided Web-based Education, Provided Education  [Encouraged]  Provided Verbal Education On Nutrition, Mental Health/Coping with Illness, When to see the doctor, Foot Care, Eye Care, Labs, Applications, Exercise, Medication, Development worker, community, [employer]  [Encouraged]  Labs Reviewed Kidney Function, Lipid Profile  [Encouraged]  Applications Medicaid, Personal Care Services  [Encouraged]  Mental Health Interventions   Mental Health Discussed/Reviewed Mental Health Discussed, Anxiety, Depression, Mental Health Reviewed, Grief and Loss, Substance Abuse, Coping Strategies, Suicide, Crisis, Other  [Domestic Violence]  Nutrition Interventions   Nutrition Discussed/Reviewed Nutrition Discussed, Adding fruits and vegetables, Increasing proteins, Decreasing fats, Decreasing salt, Nutrition Reviewed, Fluid intake, Carbohydrate meal planning, Portion sizes, Decreasing sugar intake  [Encouraged]  Pharmacy Interventions   Pharmacy Dicussed/Reviewed Pharmacy Topics Discussed, Medications and their functions, Medication Adherence, Pharmacy Topics Reviewed, Affording Medications  [Encouraged]  Safety Interventions   Safety Discussed/Reviewed Safety Discussed, Safety Reviewed, Fall Risk, Home Safety  [Encouraged]  Home Safety Assistive Devices, Need for home safety assessment, Refer for community resources  [Encouraged]  Advanced Directive Interventions   Advanced Directives Discussed/Reviewed Advanced Directives Discussed  [Encouraged]      Active Listening & Reflection Utilized.  Verbalization of Feelings Encouraged.  Emotional Support Provided. Feelings of Frustration Validated. Symptoms of Chronic Pain Acknowledged. Problem Solving Interventions Revised. Task-Centered Solutions Implemented.  Solution-Focused  Strategies Indicated. Acceptance & Commitment Therapy Conducted. Cognitive Behavioral Therapy Initiated. Client-Centered Therapy Performed. CSW Collaboration with Rica Records, Family Nurse Practitioner with [facility] [phone]), Via Secure Chat Message in Epic, to Report Ineffective Anxiety Medication Regimen, Per Your Request. CSW Collaboration with Rica Records, Family Nurse Practitioner with [facility] [phone]), Via  Secure Chat Message in Epic, to Report Ineffective Pain Medication Regimen, Per Your Request. CSW Collaboration with Rica Records, Family Nurse Practitioner with [facility] [phone]), Via Secure Chat Message in Epic, to Request Referral to Pain Management Clinic, Per Your Request. Encouraged Self-Enrollment with Psychiatrist of Interest in [facility], from List Provided, to Receive Psychotropic Medication Administration & Management, in An Effort to Reduce & Manage Symptoms of Anxiety. Encouraged Self-Enrollment with Therapist of Interest in [facility], from List Provided, to Receive Psychotherapeutic Counseling & Supportive Services, in An Effort to Reduce & Manage Symptoms of Anxiety. Encouraged Attendance at Follow-Up Appointment with Emeline Gins, Physical Therapy Assistant with [facility] at [place] [phone]), Scheduled on 04/05/2023 at 8:15 AM. Encouraged Attendance at Follow-Up Appointment with Dr. Wilkie Aye, Urologist with [facility] [place] [phone]), Scheduled on 04/11/2023 at 11:10 AM. Encouraged Attendance at Follow-Up Appointment with Wilhemena Durie, Physical Therapist with [facility] at [place] [phone]), Scheduled on 04/12/2023 at 8:00 AM. Encouraged Contact with CSW (# [phone]), if You Have Questions, Need Assistance, or If Additional  Social Work Needs Are Identified Between Now & Our Next Follow-Up Outreach Call, Scheduled on 04/18/2023 at 4:00 PM. Encouraged Attendance at Follow-Up Appointment with Dr. Charlton Haws, Cardiologist with [facility] at [facility] [phone]# [phone]), Scheduled on 04/27/2023 at 1:15 PM. Encouraged Attendance at Follow-Up Appointment with Dr. Lewayne Bunting, Cardiologist with [facility] at [facility] (# [phone], Scheduled on 05/03/2023 at 11:15 AM. Encouraged Attendance at Follow-Up Appointment with Dr. Levert Feinstein, Neurologist with [facility] [phone]), Scheduled on 05/28/2023 at 1:30 PM. Encouraged Attendance at Follow-Up Appointment with Dr. Purcell Nails, Endocrinologist with [facility] [phone]), Scheduled on 06/14/2023 at 1:30 PM. Encouraged Attendance at Follow-Up Appointment with Rica Records, Family Nurse Practitioner with [facility] [phone]), Scheduled on 06/22/2023 at 1:40 PM.      Our next appointment is by telephone on 04/18/2023 at 4:00 pm.  Please call the care guide team at [phone] if you need to cancel or reschedule your appointment.   If you are experiencing a Mental Health or Behavioral Health Crisis or need someone to talk to, please call the Suicide and Crisis Lifeline: 988 call the [place] National Suicide Prevention Lifeline: (249) 760-8869 or TTY: 207-074-5368 TTY 319-550-5848) to talk to a trained counselor call 1-800-273-TALK (toll free, 24 hour hotline) go to [facility] [address] [place] [phone]) call the [facility] Crisis Line: ([phone] call 911  Patient verbalizes understanding of instructions and care plan provided today and agrees to view in MyChart. Active MyChart status and patient understanding of how to access instructions and care plan via  MyChart confirmed with patient.     Telephone follow up appointment with care management team member scheduled for:  04/18/2023 at 4:00 pm.  Danford Bad, BSW, MSW, LCSW  Embedded Practice Social Work Case Manager  [facility], Population Health Direct Dial: [phone]  Fax: [phone] Email: [email or web address]: [email or web address]

## 2023-04-03 NOTE — Patient Outreach (Signed)
  Care Coordination   04/03/2023 Name: Steven [name] MRN: [id number] DOB: [birth year]   Care Coordination Outreach Attempts:  An unsuccessful telephone outreach was attempted for a scheduled appointment today.  Follow Up Plan:  Additional outreach attempts will be made to offer the patient care coordination information and services.   Encounter Outcome:  No Answer   Care Coordination Interventions:  No, not indicated    Aariana Shankland L. Noelle Penner, RN, BSN, CCM, Care Management Coordinator [phone]

## 2023-04-03 NOTE — Telephone Encounter (Signed)
At 10:19 pt left a vm that he ran across something on my chart about a MRI.  Pt states he just had a MRI in July.  Pt would like a call to discuss the reason as to why another MRI is needed.  Pt also states he is only open to another open MRI, no other type, please call .

## 2023-04-03 NOTE — Patient Outreach (Signed)
Care Coordination   Follow Up Visit Note   04/03/2023  Name: Steven [name] MRN: [id number] DOB: 01-14-1951  Steven [name] is a 72 y.o. year old male who sees Del Newman Nip, [place], FNP for primary care. I spoke with [name] by phone today.  What matters to the patients health and wellness today?  Receive Counseling & Supportive Services for Symptoms of Anxiety.    Goals Addressed               This Visit's Progress     Receive Counseling & Supportive Services for Symptoms of Anxiety. (pt-stated)   On track     Care Coordination Interventions:  Interventions Today    Flowsheet Row Most Recent Value  Chronic Disease   Chronic disease during today's visit Hypertension (HTN), Atrial Fibrillation (AFib), Other  [Uncontrolled Blood Pressure, Financial Insecurities & Anxiety]  General Interventions   General Interventions Discussed/Reviewed General Interventions Discussed, Labs, Vaccines, Doctor Visits, Health Screening, Annual Foot Exam, General Interventions Reviewed, Annual Eye Exam, Durable Medical Equipment (DME), [employer], Level of Care, Communication with  [Encouraged]  Labs Kidney Function  [Encouraged]  Vaccines COVID-19, Flu, Pneumonia, RSV, Tetanus/Pertussis/Diphtheria, Shingles  [Encouraged]  Doctor Visits Discussed/Reviewed Doctor Visits Discussed, Specialist, Doctor Visits Reviewed, Annual Wellness Visits, PCP  [Encouraged]  Health Screening Bone Density, Colonoscopy, Prostate  [Encouraged]  Durable Medical Equipment (DME) BP Cuff, Wheelchair  [Encouraged]  Wheelchair Standard  [Encouraged]  PCP/Specialist Visits Compliance with follow-up visit  [Encouraged]  Communication with RN, PCP/Specialists  [Encouraged]  Level of Care Adult Daycare, Applications, Assisted Living, Personal Care Services  [Encouraged]  Applications Medicaid, Personal Care Services  [Encouraged]  Exercise Interventions   Exercise Discussed/Reviewed Exercise Discussed,  Assistive device use and maintanence, Exercise Reviewed, Physical Activity, Weight Managment  [Encouraged]  Physical Activity Discussed/Reviewed Physical Activity Discussed, Home Exercise Program (HEP), Physical Activity Reviewed, Types of exercise  [Encouraged]  Weight Management Weight loss  [Encouraged]  Education Interventions   Education Provided Provided Therapist, sports, Provided Web-based Education, Provided Education  [Encouraged]  Provided Verbal Education On Nutrition, Mental Health/Coping with Illness, When to see the doctor, Foot Care, Eye Care, Labs, Applications, Exercise, Medication, Development worker, community, [employer]  [Encouraged]  Labs Reviewed Kidney Function, Lipid Profile  [Encouraged]  Applications Medicaid, Personal Care Services  [Encouraged]  Mental Health Interventions   Mental Health Discussed/Reviewed Mental Health Discussed, Anxiety, Depression, Mental Health Reviewed, Grief and Loss, Substance Abuse, Coping Strategies, Suicide, Crisis, Other  [Domestic Violence]  Nutrition Interventions   Nutrition Discussed/Reviewed Nutrition Discussed, Adding fruits and vegetables, Increasing proteins, Decreasing fats, Decreasing salt, Nutrition Reviewed, Fluid intake, Carbohydrate meal planning, Portion sizes, Decreasing sugar intake  [Encouraged]  Pharmacy Interventions   Pharmacy Dicussed/Reviewed Pharmacy Topics Discussed, Medications and their functions, Medication Adherence, Pharmacy Topics Reviewed, Affording Medications  [Encouraged]  Safety Interventions   Safety Discussed/Reviewed Safety Discussed, Safety Reviewed, Fall Risk, Home Safety  [Encouraged]  Home Safety Assistive Devices, Need for home safety assessment, Refer for community resources  [Encouraged]  Advanced Directive Interventions   Advanced Directives Discussed/Reviewed Advanced Directives Discussed  [Encouraged]      Active Listening & Reflection Utilized.  Verbalization of Feelings Encouraged.   Emotional Support Provided. Feelings of Frustration Validated. Symptoms of Chronic Pain Acknowledged. Problem Solving Interventions Revised. Task-Centered Solutions Implemented.  Solution-Focused Strategies Indicated. Acceptance & Commitment Therapy Conducted. Cognitive Behavioral Therapy Initiated. Client-Centered Therapy Performed. CSW Collaboration with Rica Records, Family Nurse Practitioner with [facility] (#  [phone]), Via Secure Chat Message in Epic, to Report Ineffective Anxiety Medication Regimen, Per Your Request. CSW Collaboration with Rica Records, Family Nurse Practitioner with [facility] [phone]), Via Secure Chat Message in Epic, to Report Ineffective Pain Medication Regimen, Per Your Request. CSW Collaboration with Rica Records, Family Nurse Practitioner with [facility] [phone]# [phone]), Via Secure Chat Message in Epic, to Request Referral to Pain Management Clinic, Per Your Request. Encouraged Self-Enrollment with Psychiatrist of Interest in [facility], from List Provided, to Receive Psychotropic Medication Administration & Management, in An Effort to Reduce & Manage Symptoms of Anxiety. Encouraged Self-Enrollment with Therapist of Interest in [facility], from List Provided, to Receive Psychotherapeutic Counseling & Supportive Services, in An Effort to Reduce & Manage Symptoms of Anxiety. Encouraged Attendance at Follow-Up Appointment with Emeline Gins, Physical Therapy Assistant with [facility] [place] [phone]), Scheduled on 04/05/2023 at 8:15 AM. Encouraged Attendance at Follow-Up Appointment with Dr. Wilkie Aye, Urologist with [facility] [place] [phone]), Scheduled on 04/11/2023 at 11:10 AM. Encouraged Attendance at Follow-Up Appointment with Wilhemena Durie, Physical Therapist with [facility] [place] [phone]), Scheduled on 04/12/2023 at 8:00 AM. Encouraged Contact with CSW (# [phone]), if You Have Questions, Need Assistance, or If Additional Social Work Needs Are Identified Between Now & Our Next Follow-Up Outreach Call, Scheduled on 04/18/2023 at 4:00 PM. Encouraged Attendance at Follow-Up Appointment with Dr. Charlton Haws, Cardiologist with [facility] [phone]# [phone]), Scheduled on 04/27/2023 at 1:15 PM. Encouraged Attendance at Follow-Up Appointment with Dr. Lewayne Bunting, Cardiologist with [facility] (# [phone], Scheduled on 05/03/2023 at 11:15 AM. Encouraged Attendance at Follow-Up Appointment with Dr. Levert Feinstein, Neurologist with [facility] [phone]), Scheduled on 05/28/2023 at 1:30 PM. Encouraged Attendance at Follow-Up Appointment with Dr. Purcell Nails, Endocrinologist with [facility] [phone]), Scheduled on 06/14/2023 at 1:30 PM. Encouraged Attendance at Follow-Up Appointment with Rica Records, Family Nurse Practitioner with [facility] [phone]), Scheduled on 06/22/2023 at 1:40 PM.      SDOH assessments and interventions completed:  Yes.  Care Coordination Interventions:  Yes, provided.   Follow up plan: Follow up call scheduled for 04/18/2023 at 4:00 pm.  Encounter Outcome:  Patient Visit Completed.  Danford Bad, BSW, MSW, Printmaker Social Work Case Set designer Health  [facility], Population Health Direct Dial: [phone]  Fax: [phone] Email: [email or web address]

## 2023-04-03 NOTE — Telephone Encounter (Signed)
I called patient. He reports that he has not heard about scheduling his CTA neck. He would like it done at [facility] and needs an open scanner, cannot tolerate enclosed spaces any longer.

## 2023-04-04 NOTE — Telephone Encounter (Signed)
Cohere Berkley Harvey: [phone] exp. 04/04/23-06/03/23 sent to [facility] [phone]

## 2023-04-05 ENCOUNTER — Telehealth: Payer: Self-pay | Admitting: Family Medicine

## 2023-04-05 ENCOUNTER — Encounter (HOSPITAL_COMMUNITY): Payer: Medicare PPO | Admitting: Physical Therapy

## 2023-04-05 NOTE — Telephone Encounter (Signed)
Patient called in regard to BP States that his BP is going up , especially when laying down ,. When checked today it  was 170/90.  Wants a call back in regard

## 2023-04-05 NOTE — Telephone Encounter (Signed)
Patient said the radiology called him saying he needs to schedule appointment for a  procedure to be done for main artery on leg. He asked if nurse can check with provider, patient does not know anything about this. He said he has contacted her offices and getting no answer. Call back # [phone]

## 2023-04-06 ENCOUNTER — Encounter: Payer: Self-pay | Admitting: Family Medicine

## 2023-04-06 ENCOUNTER — Ambulatory Visit (INDEPENDENT_AMBULATORY_CARE_PROVIDER_SITE_OTHER): Payer: Medicare PPO | Admitting: Family Medicine

## 2023-04-06 VITALS — BP 152/90 | HR 57 | Ht 67.0 in | Wt 194.1 lb

## 2023-04-06 DIAGNOSIS — I10 Essential (primary) hypertension: Secondary | ICD-10-CM | POA: Diagnosis not present

## 2023-04-06 NOTE — Patient Instructions (Addendum)
I appreciate the opportunity to provide care to you today!    Follow up:  1 week  Labs: please stop by the lab today to get your blood drawn (BMP)  Hypertension Management -Take amlodipine 5 mg daily, furosemide 20 mg daily, losartan 100 mg daily, and metoprolol 25 mg twice daily.  -Clonidine 0.1 mg may be taken twice daily as needed for systolic blood pressure greater than 160.  Your current blood pressure is above the target goal of <140/90 mmHg.    Medication Instructions: Take your blood pressure medication at the same time each day. After taking your medication, check your blood pressure at least an hour later. If your first reading is >140/90 mmHg, wait at least 10 minutes and recheck your blood pressure. You may take clonidine 0.1 mg may be taken twice daily as needed for systolic blood pressure greater than 160 Diet and Lifestyle: Adhere to a low-sodium diet, limiting intake to less than 1500 mg daily, and increase your physical activity. Avoid over-the-counter NSAIDs such as ibuprofen and naproxen while on this medication. Hydration and Nutrition: Stay well-hydrated by drinking at least 64 ounces of water daily. Increase your servings of fruits and vegetables and avoid excessive sodium in your diet. Long-Term Considerations: Uncontrolled hypertension can increase the risk of cardiovascular diseases, including stroke, coronary artery disease, and heart failure.     Please continue to a heart-healthy diet and increase your physical activities. Try to exercise for at least five days a week.    It was a pleasure to see you and I look forward to continuing to work together on your health and well-being. Please do not hesitate to call the office if you need care or have questions about your care.  In case of emergency, please visit the Emergency Department for urgent care, or contact our clinic at [phone] to schedule an appointment. We're here to help you!   Have a wonderful  day and week. With Gratitude, Gilmore Laroche MSN, FNP-BC

## 2023-04-06 NOTE — Assessment & Plan Note (Signed)
Uncontrolled likely due to medication noncompliance Hypertension Management -Take amlodipine 5 mg daily, furosemide 20 mg daily, losartan 100 mg daily, and metoprolol 25 mg twice daily.  -Clonidine 0.1 mg may be taken twice daily as needed for systolic blood pressure greater than 160.  Your current blood pressure is above the target goal of <140/90 mmHg.    Medication Instructions: Take your blood pressure medication at the same time each day. After taking your medication, check your blood pressure at least an hour later. If your first reading is >140/90 mmHg, wait at least 10 minutes and recheck your blood pressure. You may take clonidine 0.1 mg may be taken twice daily as needed for systolic blood pressure greater than 160 Diet and Lifestyle: Adhere to a low-sodium diet, limiting intake to less than 1500 mg daily, and increase your physical activity. Avoid over-the-counter NSAIDs such as ibuprofen and naproxen while on this medication. Hydration and Nutrition: Stay well-hydrated by drinking at least 64 ounces of water daily. Increase your servings of fruits and vegetables and avoid excessive sodium in your diet. Long-Term Considerations: Uncontrolled hypertension can increase the risk of cardiovascular diseases, including stroke, coronary artery disease, and heart failure.  BP Readings from Last 3 Encounters:  04/06/23 (!) 152/90  03/14/23 128/76  02/19/23 134/70

## 2023-04-06 NOTE — Telephone Encounter (Signed)
Called to offer him an appt to address (gloria had cancellations) but no answer.

## 2023-04-06 NOTE — Telephone Encounter (Signed)
Appointment scheduled for 09.13.2024

## 2023-04-06 NOTE — Progress Notes (Signed)
Acute Office Visit  Subjective:    Patient ID: Steven [name], male    DOB: [birth year], 72 y.o.   MRN: [id number]  Chief Complaint  Patient presents with   Hypertension    Pt has bp concerns has been getting high readings at home.     HPI Patient is in today for elevated ambulatory blood pressure readings.  Hypertension: The patient reports only taking furosemide 20 mg, losartan 100 mg, and metoprolol 25 mg today. He has not been taking amlodipine 5 mg daily as prescribed, instead using it on an as-needed basis when his blood pressure is elevated. Additionally, although prescribed clonidine 0.1 mg twice daily as needed for systolic blood pressure greater than 160, he has not been taking this medication. The patient is currently asymptomatic in the clinic and reports engaging in minimal physical activity. Past Medical History:  Diagnosis Date   Anxiety    Arthritis    Atrial fibrillation (HCC)    Atrial flutter (HCC)    BPH (benign prostatic hyperplasia)    Chest pain    Dizziness    Dyspnea    laying down occ   Fracture 08/17/2015   MULTIPLE RIB FRACTURES     FROM FALL    GERD (gastroesophageal reflux disease)    Hemorrhoids    History of kidney stones    noted on CT scan   History of radiation therapy 08/22/11-10/13/11   prostate   Hyperlipemia    Hypertension    Hyperthyroidism    IBS (irritable bowel syndrome)    Insomnia    Light headedness    Migraine    Numbness and tingling in left arm    Numbness and tingling of both legs    Open fracture of left elbow 08/18/2015   Prostate cancer [facility])    prostate s/p radiation Mar 2013   Rib fractures 08/17/2015   Tinnitus     Past Surgical History:  Procedure Laterality Date   BOTOX INJECTION N/A 01/14/2020   Procedure: INJECTION OF BOTOX INTO ANAL SPHINCTER;  Surgeon: Andria Meuse, MD;  Location: [facility];  Service: General;  Laterality: N/A;   COLONOSCOPY N/A 12/19/2012   [id number] 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 Normal appearing colon   COLONOSCOPY WITH PROPOFOL N/A 10/23/2019   Procedure: COLONOSCOPY WITH PROPOFOL;  Surgeon: Corbin Ade, MD; external and grade 2 internal hemorrhoids, abnormal rectal blood vessels consistent with radiation proctitis s/p APC therapy, otherwise normal exam.   EVALUATION UNDER ANESTHESIA WITH ANAL FISTULECTOMY N/A 01/14/2020   Procedure: ANORECTAL EXAM UNDER ANESTHESIA;  Surgeon: Andria Meuse, MD;  Location: [facility];  Service: General;  Laterality: N/A;   HOT HEMOSTASIS  10/23/2019   Procedure: HOT HEMOSTASIS (ARGON PLASMA COAGULATION/BICAP);  Surgeon: Corbin Ade, MD;  [facility];  Service: Endoscopy;;  apc rectal proctitis     KIDNEY SURGERY  1982   kidney tube collapse repair   RADIOACTIVE SEED IMPLANT     Prostate   SVT ABLATION N/A 12/12/2022   Procedure: SVT ABLATION;  Surgeon: Marinus Maw, MD;  [facility]: [facility];  Service: Cardiovascular;  Laterality: N/A;    Family History  Problem Relation Age of Onset   Aneurysm Mother        aortic   Hypertension Mother    Headache Mother    Prostate cancer Father    Hypertension Father    Colon cancer Neg  Hx    Colon polyps Neg Hx     Social History   Socioeconomic History   Marital status: Single    Spouse name: Not on file   Number of children: 2   Years of education: 12   Highest education level: 12th grade  Occupational History   Occupation: Custodian     Employer: [employer]  Tobacco Use   Smoking status: Former    Current packs/day: 0.00    Average packs/day: 1 pack/day for 20.0 years (20.0 ttl pk-yrs)    Types: Cigarettes    Start date: 2000    Quit date: 2020    Years since quitting: 4.7    Passive exposure: Past   Smokeless tobacco: Never   Tobacco comments:    Quit smoking x 2 years    07/2015   SOMETIIMES i USE VAPOR     Smoking Cessation Classes, Agencies,  Public librarian  Vaping Use   Vaping status: Never Used  Substance and Sexual Activity   Alcohol use: Never   Drug use: Never   Sexual activity: Not Currently    Partners: Female  Other Topics Concern   Not on file  Social History Narrative   Lives with friend   Divorced   No caffeine   Social Determinants of Health   Financial Resource Strain: Low Risk  (03/30/2023)   Overall Financial Resource Strain (CARDIA)    Difficulty of Paying Living Expenses: Not very hard  Recent Concern: Financial Resource Strain - Medium Risk (01/08/2023)   Overall Financial Resource Strain (CARDIA)    Difficulty of Paying Living Expenses: Somewhat hard  Food Insecurity: No Food Insecurity (03/30/2023)   Hunger Vital Sign    Worried About Running Out of Food in the Last Year: Never true    Ran Out of Food in the Last Year: Never true  Transportation Needs: No Transportation Needs (03/30/2023)   PRAPARE - Administrator, Civil Service (Medical): No    Lack of Transportation (Non-Medical): No  Physical Activity: Sufficiently Active (03/30/2023)   Exercise Vital Sign    Days of Exercise per Week: 6 days    Minutes of Exercise per Session: 60 min  Recent Concern: Physical Activity - Inactive (03/12/2023)   Exercise Vital Sign    Days of Exercise per Week: 0 days    Minutes of Exercise per Session: 0 min  Stress: No Stress Concern Present (03/30/2023)   [employer] of Occupational Health - Occupational Stress Questionnaire    Feeling of Stress : Only a little  Recent Concern: Stress - Stress Concern Present (01/08/2023)   [employer] of Occupational Health - Occupational Stress Questionnaire    Feeling of Stress : Rather much  Social Connections: Moderately Integrated (03/30/2023)   Social Connection and Isolation Panel [NHANES]    Frequency of Communication with Friends and Family: More than three times a week    Frequency of Social Gatherings with Friends and Family: More  than three times a week    Attends Religious Services: 1 to 4 times per year    Active Member of [employer] or Organizations: No    Attends Banker Meetings: Never    Marital Status: Living with partner  Intimate Partner Violence: Not At Risk (03/30/2023)   Humiliation, Afraid, Rape, and Kick questionnaire    Fear of Current or Ex-Partner: No    Emotionally Abused: No    Physically Abused: No    Sexually Abused:  No    Outpatient Medications Prior to Visit  Medication Sig Dispense Refill   acetaminophen (TYLENOL) 500 MG tablet Take 500 mg by mouth every 6 (six) hours as needed for moderate pain.     alfuzosin (UROXATRAL) 10 MG 24 hr tablet Take 1 tablet (10 mg total) by mouth daily with breakfast. 90 tablet 3   amLODipine (NORVASC) 5 MG tablet Take 1 tablet (5 mg total) by mouth daily. 180 tablet 3   Azelastine-Fluticasone 137-50 MCG/ACT SUSP Place 1 spray into the nose every 12 (twelve) hours. 23 g 1   Cholecalciferol (VITAMIN D3) 125 MCG (5000 UT) CAPS Take 1 capsule by mouth daily.      cloNIDine (CATAPRES) 0.1 MG tablet Take 1 tablet (0.1 mg total) by mouth 2 (two) times daily as needed (SBP>160). (Patient taking differently: Take 0.1 mg by mouth 3 (three) times daily.) 60 tablet 11   ELIQUIS 5 MG TABS tablet TAKE 1 TABLET(5 MG) BY MOUTH TWICE DAILY 60 tablet 5   furosemide (LASIX) 20 MG tablet Take 1 tablet (20 mg total) by mouth daily. Take with or after meal. 90 tablet 3   gabapentin (NEURONTIN) 300 MG capsule Take 1 capsule (300 mg total) by mouth 3 (three) times daily. 90 capsule 0   Galcanezumab-gnlm (EMGALITY) 120 MG/ML SOAJ Inject 1 Pen into the skin every 30 (thirty) days. 1.12 mL 7   hydrOXYzine (ATARAX) 10 MG tablet Take 1 tablet (10 mg total) by mouth 3 (three) times daily as needed for anxiety. 30 tablet 0   hyoscyamine (LEVBID) 0.375 MG 12 hr tablet Take 0.375 mg by mouth 2 (two) times daily.     loratadine (CLARITIN) 10 MG tablet Take 1 tablet (10 mg total) by mouth  daily. 30 tablet 11   losartan (COZAAR) 100 MG tablet Take 100 mg by mouth daily.     methimazole (TAPAZOLE) 5 MG tablet Take 0.5 tablets (2.5 mg total) by mouth daily. 45 tablet 1   mirtazapine (REMERON) 7.5 MG tablet Take 1 tablet (7.5 mg total) by mouth at bedtime. 30 tablet 1   NURTEC 75 MG TBDP Take 75 mg by mouth daily as needed (pain).     ondansetron (ZOFRAN) 4 MG tablet Take 1 tablet (4 mg total) by mouth every 8 (eight) hours as needed for nausea or vomiting. 20 tablet 1   polyethylene glycol (MIRALAX / GLYCOLAX) 17 g packet Take 17 g by mouth daily. 30 each 2   potassium chloride SA (KLOR-CON M) 20 MEQ tablet Take 1 tablet (20 mEq total) by mouth daily. 30 tablet 0   Probiotic Product (PROBIOTIC-10 PO) Take 1 capsule by mouth daily.     Ubrogepant (UBRELVY) 100 MG TABS Take 1 tablet (100 mg total) by mouth as needed (for headaches). May repeat a dose in 2 hours if needed. Max dose 2 pills in 24 hours 16 tablet 6   vitamin B-12 (CYANOCOBALAMIN) 100 MCG tablet Take 100 mcg by mouth daily.     metoprolol tartrate (LOPRESSOR) 25 MG tablet Take 1 tablet (25 mg total) by mouth 2 (two) times daily. TAKE EXTRA HALF TABLET  AS NEEDED 180 tablet 2   No facility-administered medications prior to visit.    Allergies  Allergen Reactions   Latex Rash    Possible reaction to latex gloves per patient   Meclizine     Other reaction(s): Abdominal Pain, Other    Review of Systems  Constitutional:  Negative for fatigue and fever.  Eyes:  Negative for visual disturbance.  Respiratory:  Negative for chest tightness and shortness of breath.   Cardiovascular:  Negative for chest pain and palpitations.  Neurological:  Negative for dizziness and headaches.       Objective:    Physical Exam HENT:     Head: Normocephalic.     Right Ear: External ear normal.     Left Ear: External ear normal.     Nose: No congestion or rhinorrhea.     Mouth/Throat:     Mouth: Mucous membranes are moist.   Cardiovascular:     Rate and Rhythm: Regular rhythm.     Heart sounds: No murmur heard. Pulmonary:     Effort: No respiratory distress.     Breath sounds: Normal breath sounds.  Neurological:     Mental Status: He is alert.     BP (!) 152/90 (BP Location: Left Arm)   Pulse (!) 57   Ht 5\' 7"  (1.702 m)   Wt 194 lb 1.3 oz (88 kg)   SpO2 94%   BMI 30.40 kg/m  Wt Readings from Last 3 Encounters:  04/06/23 194 lb 1.3 oz (88 kg)  03/14/23 195 lb (88.5 kg)  02/19/23 189 lb (85.7 kg)       Assessment & Plan:  Primary hypertension Assessment & Plan: Uncontrolled likely due to medication noncompliance Hypertension Management -Take amlodipine 5 mg daily, furosemide 20 mg daily, losartan 100 mg daily, and metoprolol 25 mg twice daily.  -Clonidine 0.1 mg may be taken twice daily as needed for systolic blood pressure greater than 160.  Your current blood pressure is above the target goal of <140/90 mmHg.    Medication Instructions: Take your blood pressure medication at the same time each day. After taking your medication, check your blood pressure at least an hour later. If your first reading is >140/90 mmHg, wait at least 10 minutes and recheck your blood pressure. You may take clonidine 0.1 mg may be taken twice daily as needed for systolic blood pressure greater than 160 Diet and Lifestyle: Adhere to a low-sodium diet, limiting intake to less than 1500 mg daily, and increase your physical activity. Avoid over-the-counter NSAIDs such as ibuprofen and naproxen while on this medication. Hydration and Nutrition: Stay well-hydrated by drinking at least 64 ounces of water daily. Increase your servings of fruits and vegetables and avoid excessive sodium in your diet. Long-Term Considerations: Uncontrolled hypertension can increase the risk of cardiovascular diseases, including stroke, coronary artery disease, and heart failure.  BP Readings from Last 3 Encounters:  04/06/23 (!) 152/90   03/14/23 128/76  02/19/23 134/70      Orders: -     [id number]  Note: This chart has been completed using [employer] software, and while attempts have been made to ensure accuracy, certain words and phrases may not be transcribed as intended.    Gilmore Laroche, FNP

## 2023-04-07 LAB — BMP8+EGFR
BUN/Creatinine Ratio: 10 (ref 10–24)
BUN: 10 mg/dL (ref 8–27)
CO2: 27 mmol/L (ref 20–29)
Calcium: 9.5 mg/dL (ref 8.6–10.2)
Chloride: 102 mmol/L (ref 96–106)
Creatinine, Ser: 1 mg/dL (ref 0.76–1.27)
Glucose: 97 mg/dL (ref 70–99)
Potassium: 3.8 mmol/L (ref 3.5–5.2)
Sodium: 141 mmol/L (ref 134–144)
eGFR: 80 mL/min/{1.73_m2} (ref >59)

## 2023-04-07 NOTE — Progress Notes (Signed)
Please inform the patient that his labs are stable

## 2023-04-11 ENCOUNTER — Ambulatory Visit: Payer: Medicare PPO | Admitting: Urology

## 2023-04-11 ENCOUNTER — Encounter: Payer: Self-pay | Admitting: Pharmacist

## 2023-04-11 ENCOUNTER — Encounter: Payer: Self-pay | Admitting: Urology

## 2023-04-11 VITALS — BP 136/73 | HR 57

## 2023-04-11 DIAGNOSIS — N529 Male erectile dysfunction, unspecified: Secondary | ICD-10-CM | POA: Diagnosis not present

## 2023-04-11 DIAGNOSIS — N401 Enlarged prostate with lower urinary tract symptoms: Secondary | ICD-10-CM

## 2023-04-11 DIAGNOSIS — R3912 Poor urinary stream: Secondary | ICD-10-CM | POA: Diagnosis not present

## 2023-04-11 DIAGNOSIS — N138 Other obstructive and reflux uropathy: Secondary | ICD-10-CM

## 2023-04-11 LAB — URINALYSIS, ROUTINE W REFLEX MICROSCOPIC
Bilirubin, UA: NEGATIVE
Glucose, UA: NEGATIVE
Ketones, UA: NEGATIVE
Leukocytes,UA: NEGATIVE
Nitrite, UA: NEGATIVE
Protein,UA: NEGATIVE
RBC, UA: NEGATIVE
Specific Gravity, UA: 1.015 (ref 1.005–1.030)
Urobilinogen, Ur: 0.2 mg/dL (ref 0.2–1.0)
pH, UA: 6.5 (ref 5.0–7.5)

## 2023-04-11 MED ORDER — ALFUZOSIN HCL ER 10 MG PO TB24
10.0000 mg | ORAL_TABLET | Freq: Every day | ORAL | 3 refills | Status: DC
Start: 2023-04-11 — End: 2024-03-17

## 2023-04-11 NOTE — Progress Notes (Signed)
04/11/2023 11:31 AM   Steven [name] 10-09-1950 [id number]  Referring provider: Elfredia Nevins, MD [address] [place],  [place] [address]  No chief complaint on file.   HPI: [name] is a 72yo here for followup for BPh and erectile dysfunction. IPSS 5 QOL 1 on uroxatral 10mg  at bedtime. He tried PDE5s and is currently off of them. Last time he had intercourse he got chest tightness. He also gets chest tightness with bowel movements. He has had a cardiac ablation since last visit.   PMH: Past Medical History:  Diagnosis Date   Anxiety    Arthritis    Atrial fibrillation (HCC)    Atrial flutter (HCC)    BPH (benign prostatic hyperplasia)    Chest pain    Dizziness    Dyspnea    laying down occ   Fracture 08/17/2015   MULTIPLE RIB FRACTURES     FROM FALL    GERD (gastroesophageal reflux disease)    Hemorrhoids    History of kidney stones    noted on CT scan   History of radiation therapy 08/22/11-10/13/11   prostate   Hyperlipemia    Hypertension    Hyperthyroidism    IBS (irritable bowel syndrome)    Insomnia    Light headedness    Migraine    Numbness and tingling in left arm    Numbness and tingling of both legs    Open fracture of left elbow 08/18/2015   Prostate cancer [facility])    prostate s/p radiation Mar 2013   Rib fractures 08/17/2015   Tinnitus     Surgical History: Past Surgical History:  Procedure Laterality Date   BOTOX INJECTION N/A 01/14/2020   Procedure: INJECTION OF BOTOX INTO ANAL SPHINCTER;  Surgeon: Andria Meuse, MD;  Location: [facility];  Service: General;  Laterality: N/A;   COLONOSCOPY N/A 12/19/2012   VHQ:[id number] 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 Normal appearing colon   COLONOSCOPY WITH PROPOFOL N/A 10/23/2019   Procedure: COLONOSCOPY WITH PROPOFOL;  Surgeon: Corbin Ade, MD; external and grade 2 internal hemorrhoids, abnormal rectal blood vessels consistent  with radiation proctitis s/p APC therapy, otherwise normal exam.   EVALUATION UNDER ANESTHESIA WITH ANAL FISTULECTOMY N/A 01/14/2020   Procedure: ANORECTAL EXAM UNDER ANESTHESIA;  Surgeon: Andria Meuse, MD;  Location: [facility];  Service: General;  Laterality: N/A;   HOT HEMOSTASIS  10/23/2019   Procedure: HOT HEMOSTASIS (ARGON PLASMA COAGULATION/BICAP);  Surgeon: Corbin Ade, MD;  [facility];  Service: Endoscopy;;  apc rectal proctitis     KIDNEY SURGERY  1982   kidney tube collapse repair   RADIOACTIVE SEED IMPLANT     Prostate   SVT ABLATION N/A 12/12/2022   Procedure: SVT ABLATION;  Surgeon: Marinus Maw, MD;  [facility];  Service: Cardiovascular;  Laterality: N/A;    Home Medications:  Allergies as of 04/11/2023       Reactions   Latex Rash   Possible reaction to latex gloves per patient   Meclizine    Other reaction(s): Abdominal Pain, Other        Medication List        Accurate as of April 11, 2023 11:31 AM. If you have any questions, ask your nurse or doctor.          acetaminophen 500 MG tablet Commonly known as: TYLENOL Take 500 mg by mouth every 6 (six)  hours as needed for moderate pain.   alfuzosin 10 MG 24 hr tablet Commonly known as: UROXATRAL Take 1 tablet (10 mg total) by mouth daily with breakfast.   amLODipine 5 MG tablet Commonly known as: NORVASC Take 1 tablet (5 mg total) by mouth daily.   Azelastine-Fluticasone 137-50 MCG/ACT Susp Place 1 spray into the nose every 12 (twelve) hours.   cloNIDine 0.1 MG tablet Commonly known as: CATAPRES Take 1 tablet (0.1 mg total) by mouth 2 (two) times daily as needed (SBP>160). What changed: when to take this   Eliquis 5 MG Tabs tablet Generic drug: apixaban TAKE 1 TABLET(5 MG) BY MOUTH TWICE DAILY   Emgality 120 MG/ML Soaj Generic drug: Galcanezumab-gnlm Inject 1 Pen into the skin every 30 (thirty) days.   furosemide 20 MG tablet Commonly known as:  LASIX Take 1 tablet (20 mg total) by mouth daily. Take with or after meal.   gabapentin 300 MG capsule Commonly known as: NEURONTIN Take 1 capsule (300 mg total) by mouth 3 (three) times daily.   hydrOXYzine 10 MG tablet Commonly known as: ATARAX Take 1 tablet (10 mg total) by mouth 3 (three) times daily as needed for anxiety.   hyoscyamine 0.375 MG 12 hr tablet Commonly known as: LEVBID Take 0.375 mg by mouth 2 (two) times daily.   loratadine 10 MG tablet Commonly known as: CLARITIN Take 1 tablet (10 mg total) by mouth daily.   losartan 100 MG tablet Commonly known as: COZAAR Take 100 mg by mouth daily.   methimazole 5 MG tablet Commonly known as: TAPAZOLE Take 0.5 tablets (2.5 mg total) by mouth daily.   metoprolol tartrate 25 MG tablet Commonly known as: LOPRESSOR Take 1 tablet (25 mg total) by mouth 2 (two) times daily. TAKE EXTRA HALF TABLET  AS NEEDED   mirtazapine 7.5 MG tablet Commonly known as: REMERON Take 1 tablet (7.5 mg total) by mouth at bedtime.   Nurtec 75 MG Tbdp Generic drug: Rimegepant Sulfate Take 75 mg by mouth daily as needed (pain).   ondansetron 4 MG tablet Commonly known as: Zofran Take 1 tablet (4 mg total) by mouth every 8 (eight) hours as needed for nausea or vomiting.   polyethylene glycol 17 g packet Commonly known as: MIRALAX / GLYCOLAX Take 17 g by mouth daily.   potassium chloride SA 20 MEQ tablet Commonly known as: KLOR-CON M Take 1 tablet (20 mEq total) by mouth daily.   PROBIOTIC-10 PO Take 1 capsule by mouth daily.   Ubrelvy 100 MG Tabs Generic drug: Ubrogepant Take 1 tablet (100 mg total) by mouth as needed (for headaches). May repeat a dose in 2 hours if needed. Max dose 2 pills in 24 hours   vitamin B-12 100 MCG tablet Commonly known as: CYANOCOBALAMIN Take 100 mcg by mouth daily.   Vitamin D3 125 MCG (5000 UT) Caps Take 1 capsule by mouth daily.        Allergies:  Allergies  Allergen Reactions   Latex  Rash    Possible reaction to latex gloves per patient   Meclizine     Other reaction(s): Abdominal Pain, Other    Family History: Family History  Problem Relation Age of Onset   Aneurysm Mother        aortic   Hypertension Mother    Headache Mother    Prostate cancer Father    Hypertension Father    Colon cancer Neg Hx    Colon polyps Neg Hx  Social History:  reports that he quit smoking about 4 years ago. His smoking use included cigarettes. He started smoking about 24 years ago. He has a 20 pack-year smoking history. He has been exposed to tobacco smoke. He has never used smokeless tobacco. He reports that he does not drink alcohol and does not use drugs.  ROS: All other review of systems were reviewed and are negative except what is noted above in HPI  Physical Exam: BP 136/73   Pulse (!) 57   Constitutional:  Alert and oriented, No acute distress. HEENT: [place] AT, moist mucus membranes.  Trachea midline, no masses. Cardiovascular: No clubbing, cyanosis, or edema. Respiratory: Normal respiratory effort, no increased work of breathing. GI: Abdomen is soft, nontender, nondistended, no abdominal masses GU: No CVA tenderness.  Lymph: No cervical or inguinal lymphadenopathy. Skin: No rashes, bruises or suspicious lesions. Neurologic: Grossly intact, no focal deficits, moving all 4 extremities. Psychiatric: Normal mood and affect.  Laboratory Data: Lab Results  Component Value Date   WBC 3.4 (L) 01/07/2023   HGB 13.3 01/07/2023   HCT 41.4 01/07/2023   MCV 90.8 01/07/2023   PLT 197 01/07/2023    Lab Results  Component Value Date   CREATININE 1.00 04/06/2023    Lab Results  Component Value Date   PSA 1.8 12/10/2019   PSA 2.3 11/10/2019    No results found for: "TESTOSTERONE"  Lab Results  Component Value Date   HGBA1C 5.7 (H) 01/29/2023    Urinalysis    Component Value Date/Time   COLORURINE STRAW (A) 01/07/2023 1953   APPEARANCEUR CLEAR 01/07/2023  1953   APPEARANCEUR Clear 10/17/2022 1640   LABSPEC 1.008 01/07/2023 1953   PHURINE 7.0 01/07/2023 1953   GLUCOSEU NEGATIVE 01/07/2023 1953   HGBUR NEGATIVE 01/07/2023 1953   BILIRUBINUR NEGATIVE 01/07/2023 1953   BILIRUBINUR Negative 10/17/2022 1640   KETONESUR NEGATIVE 01/07/2023 1953   PROTEINUR NEGATIVE 01/07/2023 1953   UROBILINOGEN negative (A) 12/12/2019 1113   UROBILINOGEN 1.0 08/03/2012 1156   NITRITE NEGATIVE 01/07/2023 1953   LEUKOCYTESUR NEGATIVE 01/07/2023 1953    Lab Results  Component Value Date   LABMICR <3.0 01/29/2023   WBCUA None seen 03/02/2020   LABEPIT None seen 03/02/2020   BACTERIA None seen 03/02/2020    Pertinent Imaging:  Results for orders placed during the hospital encounter of 11/07/19  DG Abdomen 1 View  Narrative CLINICAL DATA:  Constipation. Hemorrhoids. Irritable bowel syndrome. Prostate cancer.  EXAM: ABDOMEN - 1 VIEW  COMPARISON:  CT pelvis 09/25/2019 in CT abdomen 04/29/2019  FINDINGS: Oval-shaped densities along the midline of the upper abdomen, possibly in the stomach. The patient had gallstones on the 04/29/2019 exam but these densities appear more regular and more medially located than expected location for gallstones. There is another oval-shaped density projecting over the transverse colon and 1 along the rectum, again I suspect that these are within stomach/bowel.  The tiny punctate right kidney lower pole renal calculus shown on 04/29/2019 is not readily seen on today's conventional radiographs.  Dextroconvex lumbar scoliosis with rotary component. No dilated bowel. Bowel gas pattern appears otherwise unremarkable.  IMPRESSION: 1. Oval-shaped densities in the abdomen are thought to be in the stomach and bowel. 2. No dilated bowel.  Unremarkable bowel gas pattern. 3. Dextroconvex lumbar scoliosis with rotary component.   Electronically Signed By: Gaylyn Rong M.D. On: 11/07/2019 17:35  No results found  for this or any previous visit.  No results found for this or any previous  visit.  No results found for this or any previous visit.  No results found for this or any previous visit.  No valid procedures specified. No results found for this or any previous visit.  No results found for this or any previous visit.   Assessment & Plan:    1. Erectile dysfunction, unspecified erectile dysfunction type We have deferred therapy this time due to his ongoing cardiac episodes  2. Benign prostatic hyperplasia with urinary obstruction -continue uroxatral 10mg  qhs - Urinalysis, Routine w reflex microscopic  3. Weak urinary stream Continue uroxatral 10mg  qhs   No follow-ups on file.  Wilkie Aye, MD  [facility] [place]

## 2023-04-11 NOTE — Patient Instructions (Signed)

## 2023-04-12 ENCOUNTER — Ambulatory Visit (HOSPITAL_COMMUNITY): Payer: Medicare PPO | Attending: Neurosurgery

## 2023-04-12 DIAGNOSIS — M542 Cervicalgia: Secondary | ICD-10-CM | POA: Diagnosis not present

## 2023-04-12 LAB — PSA: Prostate Specific Ag, Serum: 4.1 ng/mL — ABNORMAL HIGH (ref 0.0–4.0)

## 2023-04-12 NOTE — Therapy (Signed)
Marland Kitchen   OUTPATIENT PHYSICAL THERAPY CERVICAL DISCHARGE PHYSICAL THERAPY DISCHARGE SUMMARY  Visits from Start of Care: 3  Current functional level related to goals / functional outcomes: See below    Remaining deficits: See below   Education / Equipment: HEP   Patient agrees to discharge. Patient goals were met. Patient is being discharged due to meeting the stated rehab goals.    Patient Name: Steven [name] MRN: [id number] DOB:27-Nov-1950, 72 y.o., male Today's Date: 04/12/2023  END OF SESSION:  PT End of Session - 04/12/23 0812     Visit Number 3    Number of Visits 4    Date for PT Re-Evaluation 04/12/23    Authorization Type [employer]; 4 visits approved 03/15/23 to 04/12/23    Authorization - Visit Number 2    Authorization - Number of Visits 4    Progress Note Due on Visit 4    PT Start Time 0807    PT Stop Time 0831    PT Time Calculation (min) 24 min    Activity Tolerance Patient tolerated treatment well    Behavior During Therapy [facility] for tasks assessed/performed             Past Medical History:  Diagnosis Date   Anxiety    Arthritis    Atrial fibrillation (HCC)    Atrial flutter (HCC)    BPH (benign prostatic hyperplasia)    Chest pain    Dizziness    Dyspnea    laying down occ   Fracture 08/17/2015   MULTIPLE RIB FRACTURES     FROM FALL    GERD (gastroesophageal reflux disease)    Hemorrhoids    History of kidney stones    noted on CT scan   History of radiation therapy 08/22/11-10/13/11   prostate   Hyperlipemia    Hypertension    Hyperthyroidism    IBS (irritable bowel syndrome)    Insomnia    Light headedness    Migraine    Numbness and tingling in left arm    Numbness and tingling of both legs    Open fracture of left elbow 08/18/2015   Prostate cancer [facility])    prostate s/p radiation Mar 2013   Rib fractures 08/17/2015   Tinnitus    Past Surgical History:  Procedure Laterality Date   BOTOX INJECTION N/A 01/14/2020    Procedure: INJECTION OF BOTOX INTO ANAL SPHINCTER;  Surgeon: Andria Meuse, MD;  Location: [facility] ORS;  Service: General;  Laterality: N/A;   COLONOSCOPY N/A 12/19/2012   WUJ:[id number] 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 Normal appearing colon   COLONOSCOPY WITH PROPOFOL N/A 10/23/2019   Procedure: COLONOSCOPY WITH PROPOFOL;  Surgeon: Corbin Ade, MD; external and grade 2 internal hemorrhoids, abnormal rectal blood vessels consistent with radiation proctitis s/p APC therapy, otherwise normal exam.   EVALUATION UNDER ANESTHESIA WITH ANAL FISTULECTOMY N/A 01/14/2020   Procedure: ANORECTAL EXAM UNDER ANESTHESIA;  Surgeon: Andria Meuse, MD;  Location: [facility] ORS;  Service: General;  Laterality: N/A;   HOT HEMOSTASIS  10/23/2019   Procedure: HOT HEMOSTASIS (ARGON PLASMA COAGULATION/BICAP);  Surgeon: Corbin Ade, MD;  Location: [facility] ENDO SUITE;  Service: Endoscopy;;  apc rectal proctitis     KIDNEY SURGERY  1982   kidney tube collapse repair   RADIOACTIVE SEED IMPLANT     Prostate   SVT ABLATION N/A 12/12/2022   Procedure: SVT ABLATION;  Surgeon: Marinus Maw,  MD;  Location: [facility] INVASIVE CV LAB;  Service: Cardiovascular;  Laterality: N/A;   Patient Active Problem List   Diagnosis Date Noted   Mixed hyperlipidemia 03/14/2023   Prediabetes 03/14/2023   Diffuse pain 02/19/2023   Hypertensive urgency 11/24/2022   Leukopenia 11/24/2022   SVT (supraventricular tachycardia) 11/24/2022   Orthostatic hypotension 11/23/2022   Chest tightness 08/22/2022   Hypokalemia 08/21/2022   Calculus of gallbladder without cholecystitis without obstruction 08/09/2022   Secondary adrenocortical insufficiency (HCC) 07/19/2022   Rash and nonspecific skin eruption 05/10/2022   Dysphagia 05/10/2022   Chronic pain 07/07/2020   Paresthesia of upper limb 03/04/2020   Carcinoma of prostate (HCC) 02/17/2020   Subacute thyroiditis 01/07/2020    Personal history of malignant neoplasm of prostate 12/12/2019   Benign prostatic hyperplasia with urinary obstruction 12/12/2019   Weak urinary stream 12/12/2019   Hyperthyroidism 11/24/2019   Unintentional weight loss 10/21/2019   IBS (irritable bowel syndrome) 10/21/2019   Dizzy spells 09/20/2019   Intractable chronic migraine without aura 09/20/2019   Obstructive sleep apnea syndrome 09/20/2019   Psychophysiologic insomnia 09/20/2019   Rectal pain 08/21/2019   Nausea without vomiting 05/30/2019   Paroxysmal atrial flutter (HCC) 04/17/2019   Chronic atrial fibrillation (HCC) 03/02/2019   HTN (hypertension) 03/02/2019   Constipation 08/02/2017   Hemorrhoids 08/02/2017   Fall 08/18/2015   Radiation proctitis 01/30/2013   Rectal bleeding 12/10/2012   Obese 05/30/2012   Dyspnea 05/30/2012   Elevated lipids 05/30/2012   Palpitations 05/30/2012   Malignant neoplasm of prostate (HCC) 08/13/2011    PCP: William Hamburger, FNP  REFERRING PROVIDER: Bedelia Person, MD  REFERRING DIAG:  Diagnosis  M47.812 (ICD-10-CM) - Spondylosis without myelopathy or radiculopathy, cervical region    THERAPY DIAG:  Cervicalgia  Neck pain  Rationale for Evaluation and Treatment: Rehabilitation  ONSET DATE: off and on for about a year  SUBJECTIVE:                                                                                                                                                                                                         SUBJECTIVE STATEMENT: Neck is feeling good; no pain.  "Haven't thought about my neck in a while"  Eval:Neck pain off and on for about a year; saw Dr. Maisie Fus who tells him that it's mostly arthritic changes and degenerative changes; sometimes has some numbness and tingling in hands. Reports he is also seeing a chiropractor Hand dominance: Right  PERTINENT HISTORY:  Heart ablation May 2024 had a fall at work in 2017  PAIN:  Are you  having pain? Yes: NPRS scale: 0-5/10 Pain location: neck Pain description: stiff, tired, sore Aggravating factors: worse at night Relieving factors: change positions  PRECAUTIONS: None   WEIGHT BEARING RESTRICTIONS: No  FALLS:  Has patient fallen in last 6 months? No  OCCUPATION: retired  PLOF: Independent  PATIENT GOALS: relieve pain  NEXT MD VISIT: PRN  OBJECTIVE:   DIAGNOSTIC FINDINGS:  None recent in epic  PATIENT SURVEYS:  FOTO 61  COGNITION: Overall cognitive status: Within functional limits for tasks assessed  SENSATION: Some numbness and tingling in hands  POSTURE: rounded shoulders, forward head, and increased thoracic kyphosis  PALPATION: Tight bilateral upper traps R > L; general soreness cervical paraspinals   CERVICAL ROM:   Active ROM AROM (deg) eval AROM 04/12/23  Flexion 51 full  Extension 18 58  Right lateral flexion 26 32  Left lateral flexion 32 33  Right rotation 50* 54  Left rotation 65 82   (Blank rows = not tested)  UPPER EXTREMITY ROM:  Active ROM Right eval Left eval  Shoulder flexion (sitting) 132   Shoulder extension    Shoulder abduction    Shoulder adduction    Shoulder extension    Shoulder internal rotation    Shoulder external rotation    Elbow flexion    Elbow extension    Wrist flexion    Wrist extension    Wrist ulnar deviation    Wrist radial deviation    Wrist pronation    Wrist supination     (Blank rows = not tested)  UPPER EXTREMITY MMT:  MMT Right eval Left eval  Shoulder flexion 5 4+  Shoulder extension    Shoulder abduction    Shoulder adduction    Shoulder extension    Shoulder internal rotation    Shoulder external rotation    Middle trapezius    Lower trapezius    Elbow flexion    Elbow extension    Wrist flexion    Wrist extension    Wrist ulnar deviation    Wrist radial deviation    Wrist pronation    Wrist supination    Grip strength     (Blank rows = not  tested)  CERVICAL SPECIAL TESTS:  Distraction test: Negative  FUNCTIONAL TESTS:  5 times sit to stand: 15.78 using hands 1st repetition.  TODAY'S TREATMENT:                                                                                                                              DATE:  04/12/23 Progress note FOTO 96 5 times sit to stand 15.01 sic no UE assist AROM of Cspine see above  03/22/23  Review of HEP and goals Seated  Cervical retractions x 10 Upper trap stretch 10" x 5 each Scapular retraction 2" hold x 10 Thoracic extension over back of chair x 10 STM to cervical paraspinals and upper traps to decrease pain  and increase tissue extensibility x 8'; no other intervention performed during manual treatment.     03/15/23 physical therapy evaluation and HEP instruction   PATIENT EDUCATION:  Education details: Patient educated on exam findings, POC, scope of PT, HEP, and what to expect next visit. Person educated: Patient Education method: Explanation, Demonstration, and Handouts Education comprehension: verbalized understanding, returned demonstration, verbal cues required, and tactile cues required  HOME EXERCISE PROGRAM: 03/22/23 thoracic extension, scapular retractions Access Code: [email or web address] URL: [email or web address] Date: 03/15/2023 Prepared by: [facility] - Rehab  Exercises - Seated Cervical Sidebending Stretch  - 2 x daily - 7 x weekly - 1 sets - 5 reps - 20 sec hold - Seated Cervical Retraction  - 2 x daily - 7 x weekly - 1 sets - 10 reps - Seated Correct Posture  - 1 x daily - 7 x weekly - 1 sets - 1 reps  ASSESSMENT:  CLINICAL IMPRESSION: Progress note today; good mobility improvement in cervical spine.  Good improvement with FOTO.   Eval:Patient is a 72 y.o. male who was seen today for physical therapy evaluation and treatment for 47.812 (ICD-10-CM) - Spondylosis without myelopathy or radiculopathy, cervical region.  Patient demonstrates decreased  strength, ROM restriction, reduced flexibility, increased tenderness to palpation and postural abnormalities which are likely contributing to symptoms of pain and are negatively impacting patient ability to perform ADLs. Patient will benefit from skilled physical therapy services to address these deficits to reduce pain and improve level of function with ADLs   OBJECTIVE IMPAIRMENTS: decreased activity tolerance, decreased mobility, decreased ROM, decreased strength, hypomobility, increased fascial restrictions, impaired perceived functional ability, postural dysfunction, and pain.   ACTIVITY LIMITATIONS: carrying, lifting, sitting, standing, and reach over head  PARTICIPATION LIMITATIONS: meal prep, cleaning, laundry, driving, shopping, community activity, and yard work  [facility] POTENTIAL: Good  CLINICAL DECISION MAKING: Stable/uncomplicated  EVALUATION COMPLEXITY: Low   GOALS: Goals reviewed with patient? No  SHORT TERM GOALS: Target date: 03/29/2023  patient will be independent with initial HEP  Baseline:  Goal status: med  2.  Patient will self report 30% improvement to improve tolerance for functional activity   Baseline:  Goal status: met  LONG TERM GOALS: Target date: 04/12/2023  Patient will be independent in self management strategies to improve quality of life and functional outcomes Baseline:  Goal status: in progress   2.  Patient will self report 50% improvement to improve tolerance for functional activity   Baseline:  Goal status: met  3.  Patient will improve FOTO score by 5 points to demonstrate improved perceived functional mobility  Baseline: 61; 96  Goal status: met  4.  Patient will improve cervical mobility by 20 degrees total to improve ability to scan for safety Baseline: see above Goal status: met  5.  Patient will demonstrate good sitting posture without cues x 15' to be able to perform short drives without increased neck pain Baseline:   Goal status: met   PLAN:  PT FREQUENCY: 1x/week  PT DURATION: 4 weeks  PLANNED INTERVENTIONS: Therapeutic exercises, Therapeutic activity, Neuromuscular re-education, Balance training, Gait training, Patient/Family education, Joint manipulation, Joint mobilization, Stair training, Orthotic/Fit training, DME instructions, Aquatic Therapy, Dry Needling, Electrical stimulation, Spinal manipulation, Spinal mobilization, Cryotherapy, Moist heat, Compression bandaging, scar mobilization, Splintting, Taping, Traction, Ultrasound, Ionotophoresis 4mg /ml Dexamethasone, and Manual therapy   PLAN FOR NEXT SESSION: discharge  8:29 AM, 04/12/23 Graceyn Fodor Small Trent Theisen MPT [facility] [place] [phone] [phone]

## 2023-04-16 ENCOUNTER — Telehealth: Payer: Self-pay | Admitting: Urology

## 2023-04-16 NOTE — Telephone Encounter (Signed)
Please advise on recommendations for elevated psa

## 2023-04-16 NOTE — Telephone Encounter (Signed)
Patient called he seen his PSA results on mychart and would like to know what they mean.

## 2023-04-18 ENCOUNTER — Other Ambulatory Visit: Payer: Self-pay | Admitting: Family Medicine

## 2023-04-18 ENCOUNTER — Ambulatory Visit: Payer: Self-pay | Admitting: *Deleted

## 2023-04-18 NOTE — Patient Instructions (Signed)
Visit Information  Thank you for taking time to visit with me today. Please don't hesitate to contact me if I can be of assistance to you.   Following are the goals we discussed today:   Goals Addressed               This Visit's Progress     Receive Counseling & Supportive Services for Symptoms of Anxiety. (pt-stated)   On track     Care Coordination Interventions:  Interventions Today    Flowsheet Row Most Recent Value  Chronic Disease   Chronic disease during today's visit Hypertension (HTN), Atrial Fibrillation (AFib), Other  [Uncontrolled Blood Pressure, Financial Insecurities & Anxiety]  General Interventions   General Interventions Discussed/Reviewed General Interventions Discussed, Labs, Vaccines, Doctor Visits, Health Screening, Annual Foot Exam, General Interventions Reviewed, Annual Eye Exam, Durable Medical Equipment (DME), [employer], Level of Care, Communication with  [Encouraged]  Labs Kidney Function  [Encouraged]  Vaccines COVID-19, Flu, Pneumonia, RSV, Tetanus/Pertussis/Diphtheria, Shingles  [Encouraged]  Doctor Visits Discussed/Reviewed Doctor Visits Discussed, Specialist, Doctor Visits Reviewed, Annual Wellness Visits, PCP  [Encouraged]  Health Screening Bone Density, Colonoscopy, Prostate  [Encouraged]  Durable Medical Equipment (DME) BP Cuff, Wheelchair  [Encouraged]  Wheelchair Standard  [Encouraged]  PCP/Specialist Visits Compliance with follow-up visit  [Encouraged]  Communication with RN, PCP/Specialists  [Encouraged]  Level of Care Adult Daycare, Applications, Assisted Living, Personal Care Services  [Encouraged]  Applications Medicaid, Personal Care Services  [Encouraged]  Exercise Interventions   Exercise Discussed/Reviewed Exercise Discussed, Assistive device use and maintanence, Exercise Reviewed, Physical Activity, Weight Managment  [Encouraged]  Physical Activity Discussed/Reviewed Physical Activity Discussed, Home Exercise Program (HEP),  Physical Activity Reviewed, Types of exercise  [Encouraged]  Weight Management Weight loss  [Encouraged]  Education Interventions   Education Provided Provided Therapist, sports, Provided Web-based Education, Provided Education  [Encouraged]  Provided Verbal Education On Nutrition, Mental Health/Coping with Illness, When to see the doctor, Foot Care, Eye Care, Labs, Applications, Exercise, Medication, Development worker, community, [employer]  [Encouraged]  Labs Reviewed Kidney Function, Lipid Profile  [Encouraged]  Applications Medicaid, Personal Care Services  [Encouraged]  Mental Health Interventions   Mental Health Discussed/Reviewed Mental Health Discussed, Anxiety, Depression, Mental Health Reviewed, Grief and Loss, Substance Abuse, Coping Strategies, Suicide, Crisis, Other  [Domestic Violence]  Nutrition Interventions   Nutrition Discussed/Reviewed Nutrition Discussed, Adding fruits and vegetables, Increasing proteins, Decreasing fats, Decreasing salt, Nutrition Reviewed, Fluid intake, Carbohydrate meal planning, Portion sizes, Decreasing sugar intake  [Encouraged]  Pharmacy Interventions   Pharmacy Dicussed/Reviewed Pharmacy Topics Discussed, Medications and their functions, Medication Adherence, Pharmacy Topics Reviewed, Affording Medications  [Encouraged]  Safety Interventions   Safety Discussed/Reviewed Safety Discussed, Safety Reviewed, Fall Risk, Home Safety  [Encouraged]  Home Safety Assistive Devices, Need for home safety assessment, Refer for community resources  [Encouraged]  Advanced Directive Interventions   Advanced Directives Discussed/Reviewed Advanced Directives Discussed  [Encouraged]      Active Listening & Reflection Utilized.  Verbalization of Feelings Encouraged.  Emotional Support Provided. Symptoms of Chronic Pain Acknowledged. Problem Solving Interventions Enacted. Task-Centered Solutions Activated.  Solution-Focused Strategies Employed. Acceptance & Commitment  Therapy Performed. Cognitive Behavioral Therapy Conducted. Client-Centered Therapy Initiated. Encouraged Routine Engagement in Activities of Interest, Inside & Outside the Home. Encouraged Increased Level of Activity & Exercise, as Tolerated. Encouraged Administration of Medications, Exactly as Prescribed. Encouraged Daily Implementation of Deep Breathing Exercises, Relaxation Techniques, & Mindfulness Meditation Strategies, in An Effort to Reduce & Manage Symptoms of Anxiety. Encouraged Journaling as A Means  to Express Feelings & Emotions, in An Effort to Reduce & Manage Symptoms of Anxiety. Encouraged Self-Enrollment with Psychiatrist of Interest in [facility], from List Provided, to Receive Psychotropic Medication Administration & Management, in An Effort to Reduce & Manage Symptoms of Anxiety. Encouraged Self-Enrollment with Therapist of Interest in [facility], from List Provided, to Receive Psychotherapeutic Counseling & Supportive Services, in An Effort to Reduce & Manage Symptoms of Anxiety. Encouraged Attendance at All Upcoming Physician Appointments & Provider Practice Visits, Thoroughly Reviewing Each Appointment to Confirm Accuracy. Encouraged Attendance at Follow-Up Appointment with Rica Records, Family Nurse Practitioner with [facility] [phone]), Scheduled on 04/19/2023 at 11:00 AM. Encouraged Attendance at Follow-Up Appointment with Dr. Charlton Haws, Cardiologist with [facility] [phone]# [phone]), Scheduled on 04/27/2023 at 1:15 PM. Encouraged Attendance at Follow-Up Appointment with Dr. Lewayne Bunting, Cardiologist with [facility] (# [phone], Scheduled on 05/03/2023 at 11:15 AM. Encouraged Contact with CSW (# [phone]), if You Have Questions, Need Assistance, or If Additional Social Work Needs Are Identified Between Now & Our Next Follow-Up Outreach Call, Scheduled on  05/22/2023 at 10:15 AM. Encouraged Attendance at Follow-Up Appointment with Dr. Levert Feinstein, Neurologist with [facility] [phone]), Scheduled on 05/28/2023 at 1:30 PM. Encouraged Attendance at Follow-Up Appointment with Dr. Purcell Nails, Endocrinologist with [facility] [phone]), Scheduled on 06/14/2023 at 1:30 PM. Encouraged Attendance at Follow-Up Appointment with Rica Records, Family Nurse Practitioner with [facility] [phone]), Scheduled on 06/22/2023 at 1:40 PM. Encouraged Attendance at Follow-Up Appointment with [facility] [phone]), to Undergo CT Scan, Scheduled on 04/25/2023 at 8:30 AM.      Our next appointment is by telephone on 05/22/2023 at 10:15 am.  Please call the care guide team at [phone] if you need to cancel or reschedule your appointment.   If you are experiencing a Mental Health or Behavioral Health Crisis or need someone to talk to, please call the Suicide and Crisis Lifeline: 988 call the [place] National Suicide Prevention Lifeline: (970)656-5927 or TTY: 720-187-4433 TTY (931)178-5648) to talk to a trained counselor call 1-800-273-TALK (toll free, 24 hour hotline) go to [facility] [address], [place] [phone]) call the [facility] Crisis Line: [phone] call 911  Patient verbalizes understanding of instructions and care plan provided today and agrees to view in MyChart. Active MyChart status and patient understanding of how to access instructions and care plan via MyChart confirmed with patient.     Telephone follow up appointment with care management team member scheduled for:   05/22/2023 at 10:15 am.  Danford Bad, BSW, MSW, LCSW  Embedded [employer] Social Work Case Manager  [facility], Population  Health Direct Dial: [phone]  Fax: [phone] Email: [email or web address]: [email or web address]

## 2023-04-18 NOTE — Patient Outreach (Signed)
Care Coordination   Follow Up Visit Note   04/18/2023  Name: Steven [name] MRN: [id number] DOB: 1951-07-03  Steven [name] is a 72 y.o. year old male who sees Del Newman Nip, [place], FNP for primary care. I spoke with [name] by phone today.  What matters to the patients health and wellness today?  Receive Counseling & Supportive Services for Symptoms of Anxiety.    Goals Addressed               This Visit's Progress     Receive Counseling & Supportive Services for Symptoms of Anxiety. (pt-stated)   On track     Care Coordination Interventions:  Interventions Today    Flowsheet Row Most Recent Value  Chronic Disease   Chronic disease during today's visit Hypertension (HTN), Atrial Fibrillation (AFib), Other  [Uncontrolled Blood Pressure, Financial Insecurities & Anxiety]  General Interventions   General Interventions Discussed/Reviewed General Interventions Discussed, Labs, Vaccines, Doctor Visits, Health Screening, Annual Foot Exam, General Interventions Reviewed, Annual Eye Exam, Durable Medical Equipment (DME), [employer], Level of Care, Communication with  [Encouraged]  Labs Kidney Function  [Encouraged]  Vaccines COVID-19, Flu, Pneumonia, RSV, Tetanus/Pertussis/Diphtheria, Shingles  [Encouraged]  Doctor Visits Discussed/Reviewed Doctor Visits Discussed, Specialist, Doctor Visits Reviewed, Annual Wellness Visits, PCP  [Encouraged]  Health Screening Bone Density, Colonoscopy, Prostate  [Encouraged]  Durable Medical Equipment (DME) BP Cuff, Wheelchair  [Encouraged]  Wheelchair Standard  [Encouraged]  PCP/Specialist Visits Compliance with follow-up visit  [Encouraged]  Communication with RN, PCP/Specialists  [Encouraged]  Level of Care Adult Daycare, Applications, Assisted Living, Personal Care Services  [Encouraged]  Applications Medicaid, Personal Care Services  [Encouraged]  Exercise Interventions   Exercise Discussed/Reviewed Exercise Discussed,  Assistive device use and maintanence, Exercise Reviewed, Physical Activity, Weight Managment  [Encouraged]  Physical Activity Discussed/Reviewed Physical Activity Discussed, Home Exercise Program (HEP), Physical Activity Reviewed, Types of exercise  [Encouraged]  Weight Management Weight loss  [Encouraged]  Education Interventions   Education Provided Provided Therapist, sports, Provided Web-based Education, Provided Education  [Encouraged]  Provided Verbal Education On Nutrition, Mental Health/Coping with Illness, When to see the doctor, Foot Care, Eye Care, Labs, Applications, Exercise, Medication, Development worker, community, [employer]  [Encouraged]  Labs Reviewed Kidney Function, Lipid Profile  [Encouraged]  Applications Medicaid, Personal Care Services  [Encouraged]  Mental Health Interventions   Mental Health Discussed/Reviewed Mental Health Discussed, Anxiety, Depression, Mental Health Reviewed, Grief and Loss, Substance Abuse, Coping Strategies, Suicide, Crisis, Other  [Domestic Violence]  Nutrition Interventions   Nutrition Discussed/Reviewed Nutrition Discussed, Adding fruits and vegetables, Increasing proteins, Decreasing fats, Decreasing salt, Nutrition Reviewed, Fluid intake, Carbohydrate meal planning, Portion sizes, Decreasing sugar intake  [Encouraged]  Pharmacy Interventions   Pharmacy Dicussed/Reviewed Pharmacy Topics Discussed, Medications and their functions, Medication Adherence, Pharmacy Topics Reviewed, Affording Medications  [Encouraged]  Safety Interventions   Safety Discussed/Reviewed Safety Discussed, Safety Reviewed, Fall Risk, Home Safety  [Encouraged]  Home Safety Assistive Devices, Need for home safety assessment, Refer for community resources  [Encouraged]  Advanced Directive Interventions   Advanced Directives Discussed/Reviewed Advanced Directives Discussed  [Encouraged]      Active Listening & Reflection Utilized.  Verbalization of Feelings Encouraged.   Emotional Support Provided. Symptoms of Chronic Pain Acknowledged. Problem Solving Interventions Enacted. Task-Centered Solutions Activated.  Solution-Focused Strategies Employed. Acceptance & Commitment Therapy Performed. Cognitive Behavioral Therapy Conducted. Client-Centered Therapy Initiated. Encouraged Routine Engagement in Activities of Interest, Inside & Outside the Home. Encouraged Increased Level of Activity & Exercise, as Tolerated.  Encouraged Administration of Medications, Exactly as Prescribed. Encouraged Daily Implementation of Deep Breathing Exercises, Relaxation Techniques, & Mindfulness Meditation Strategies, in An Effort to Reduce & Manage Symptoms of Anxiety. Encouraged Journaling as A Means to Express Feelings & Emotions, in An Effort to Reduce & Manage Symptoms of Anxiety. Encouraged Self-Enrollment with Psychiatrist of Interest in [facility], from List Provided, to Receive Psychotropic Medication Administration & Management, in An Effort to Reduce & Manage Symptoms of Anxiety. Encouraged Self-Enrollment with Therapist of Interest in [facility], from List Provided, to Receive Psychotherapeutic Counseling & Supportive Services, in An Effort to Reduce & Manage Symptoms of Anxiety. Encouraged Attendance at All Upcoming Physician Appointments & Provider Practice Visits, Thoroughly Reviewing Each Appointment to Confirm Accuracy. Encouraged Attendance at Follow-Up Appointment with Rica Records, Family Nurse Practitioner with [facility] [phone]), Scheduled on 04/19/2023 at 11:00 AM. Encouraged Attendance at Follow-Up Appointment with Dr. Charlton Haws, Cardiologist with [facility] [phone]# [phone]), Scheduled on 04/27/2023 at 1:15 PM. Encouraged Attendance at Follow-Up Appointment with Dr. Lewayne Bunting, Cardiologist with [facility] (# [phone], Scheduled on 05/03/2023  at 11:15 AM. Encouraged Contact with CSW (# [phone]), if You Have Questions, Need Assistance, or If Additional Social Work Needs Are Identified Between Now & Our Next Follow-Up Outreach Call, Scheduled on 05/22/2023 at 10:15 AM. Encouraged Attendance at Follow-Up Appointment with Dr. Levert Feinstein, Neurologist with [facility] [phone]), Scheduled on 05/28/2023 at 1:30 PM. Encouraged Attendance at Follow-Up Appointment with Dr. Purcell Nails, Endocrinologist with [facility] [phone]), Scheduled on 06/14/2023 at 1:30 PM. Encouraged Attendance at Follow-Up Appointment with Rica Records, Family Nurse Practitioner with [facility] [phone]), Scheduled on 06/22/2023 at 1:40 PM. Encouraged Attendance at Follow-Up Appointment with [facility] [phone]), to Undergo CT Scan, Scheduled on 04/25/2023 at 8:30 AM.      SDOH assessments and interventions completed:  Yes.  Care Coordination Interventions:  Yes, provided.   Follow up plan: Follow up call scheduled for 05/22/2023 at 10:15 am.  Encounter Outcome:  Patient Visit Completed.   Danford Bad, BSW, MSW, Printmaker Social Work Case Set designer Health  [facility], Population Health Direct Dial: [phone]  Fax: [phone] Email: [email or web address]

## 2023-04-19 ENCOUNTER — Emergency Department (HOSPITAL_COMMUNITY): Payer: Medicare PPO

## 2023-04-19 ENCOUNTER — Encounter: Payer: Self-pay | Admitting: Family Medicine

## 2023-04-19 ENCOUNTER — Other Ambulatory Visit: Payer: Self-pay

## 2023-04-19 ENCOUNTER — Encounter: Payer: Self-pay | Admitting: Cardiovascular Disease

## 2023-04-19 ENCOUNTER — Encounter (HOSPITAL_COMMUNITY): Payer: Self-pay | Admitting: *Deleted

## 2023-04-19 ENCOUNTER — Emergency Department (HOSPITAL_COMMUNITY)
Admission: EM | Admit: 2023-04-19 | Discharge: 2023-04-20 | Disposition: A | Discharge status: Home / Self Care | Payer: Medicare PPO

## 2023-04-19 ENCOUNTER — Ambulatory Visit: Payer: Medicare PPO | Admitting: Family Medicine

## 2023-04-19 ENCOUNTER — Telehealth: Payer: Self-pay | Admitting: Cardiovascular Disease

## 2023-04-19 VITALS — BP 137/84 | HR 60 | Ht 67.0 in | Wt 196.1 lb

## 2023-04-19 DIAGNOSIS — F419 Anxiety disorder, unspecified: Secondary | ICD-10-CM | POA: Insufficient documentation

## 2023-04-19 DIAGNOSIS — R0789 Other chest pain: Secondary | ICD-10-CM | POA: Diagnosis not present

## 2023-04-19 DIAGNOSIS — R519 Headache, unspecified: Secondary | ICD-10-CM | POA: Diagnosis not present

## 2023-04-19 DIAGNOSIS — G894 Chronic pain syndrome: Secondary | ICD-10-CM | POA: Diagnosis not present

## 2023-04-19 DIAGNOSIS — F339 Major depressive disorder, recurrent, unspecified: Secondary | ICD-10-CM

## 2023-04-19 DIAGNOSIS — Z91040 Latex allergy status: Secondary | ICD-10-CM | POA: Diagnosis not present

## 2023-04-19 DIAGNOSIS — F418 Other specified anxiety disorders: Secondary | ICD-10-CM | POA: Insufficient documentation

## 2023-04-19 DIAGNOSIS — Z7901 Long term (current) use of anticoagulants: Secondary | ICD-10-CM | POA: Insufficient documentation

## 2023-04-19 DIAGNOSIS — Z79899 Other long term (current) drug therapy: Secondary | ICD-10-CM | POA: Insufficient documentation

## 2023-04-19 DIAGNOSIS — I700 Atherosclerosis of aorta: Secondary | ICD-10-CM | POA: Diagnosis not present

## 2023-04-19 DIAGNOSIS — R079 Chest pain, unspecified: Secondary | ICD-10-CM | POA: Diagnosis not present

## 2023-04-19 DIAGNOSIS — K627 Radiation proctitis: Secondary | ICD-10-CM | POA: Diagnosis not present

## 2023-04-19 DIAGNOSIS — I10 Essential (primary) hypertension: Secondary | ICD-10-CM | POA: Insufficient documentation

## 2023-04-19 LAB — CBC
HCT: 45 % (ref 39.0–52.0)
Hemoglobin: 14.2 g/dL (ref 13.0–17.0)
MCH: 28.3 pg (ref 26.0–34.0)
MCHC: 31.6 g/dL (ref 30.0–36.0)
MCV: 89.8 fL (ref 80.0–100.0)
Platelets: 189 10*3/uL (ref 150–400)
RBC: 5.01 MIL/uL (ref 4.22–5.81)
RDW: 12.9 % (ref 11.5–15.5)
WBC: 4.1 10*3/uL (ref 4.0–10.5)
nRBC: 0 % (ref 0.0–0.2)

## 2023-04-19 LAB — BASIC METABOLIC PANEL
Anion gap: 10 (ref 5–15)
BUN: 12 mg/dL (ref 8–23)
CO2: 28 mmol/L (ref 22–32)
Calcium: 9.6 mg/dL (ref 8.9–10.3)
Chloride: 103 mmol/L (ref 98–111)
Creatinine, Ser: 0.88 mg/dL (ref 0.61–1.24)
GFR, Estimated: >60 mL/min (ref >60)
Glucose, Bld: 95 mg/dL (ref 70–99)
Potassium: 3.5 mmol/L (ref 3.5–5.1)
Sodium: 141 mmol/L (ref 135–145)

## 2023-04-19 LAB — TROPONIN I (HIGH SENSITIVITY): Troponin I (High Sensitivity): 7 ng/L (ref <18)

## 2023-04-19 MED ORDER — PREGABALIN 25 MG PO CAPS: 25.0000 mg | ORAL_CAPSULE | Freq: Two times a day (BID) | ORAL | 2 refills | Status: DC

## 2023-04-19 MED ORDER — CLONIDINE HCL 0.1 MG PO TABS
0.1000 mg | ORAL_TABLET | Freq: Once | ORAL | Status: AC
  Administered 2023-04-19: 0.1 mg via ORAL
  Filled 2023-04-19: qty 1

## 2023-04-19 NOTE — Telephone Encounter (Signed)
Pt states that he has been having sharp pains throughout his body and head. States that he was seen by PCP today and given another pain medicine. He is currently in the ED. Reports that his systolic BP is in the 160's to 130's. States that he has had 3-4 spells over the last month. When he has these spells he feels dizzy, fatigue, SOB. They often happen after he has a BM. Pt state that he is unable to talk on the phone long because he gets fatigue, SOB and feel as if he is going to pass out. States that it is worse when he talks to a Neurosurgeon".  Encouraged pt to stay in the ER to be seen. Please advise.

## 2023-04-19 NOTE — ED Triage Notes (Signed)
Pt c/o left side head pain; pt states it goes down the side of his head and down his ear that started yesterday

## 2023-04-19 NOTE — ED Provider Notes (Signed)
[place] EMERGENCY DEPARTMENT AT [facility] Provider Note   CSN: [id number] Arrival date & time: 04/19/23  1430     History  Chief Complaint  Patient presents with   Headache    Steven [name] is a 72 y.o. male.  72 year old male with past medical history of hypertension as well as migraine headaches and atrial fibrillation on Eliquis presenting to the emergency department today with multiple complaints.  The patient states that he has been having shooting pains throughout his body.  He states that over the past few days that he has been having shooting pains in his extremities his abdomen, his chest and most recently on the left side of his head.  He states that this started a few days ago and has been happening intermittently now for the past few months.  He has tried to see his doctor regarding this and was referred to a rheumatologist.  His workup was unremarkable.  He states that the episode today happened yesterday as well where he had a sharp pain in the left side of his head that came on very quickly and also left very quickly.  He denies any pain now.  He does report a history of migraine headaches but states that this is different.  He denies any new vision changes with this.  He denies any fevers.  Denies any recent injuries.  He states that he has been having some intermittent chest pain now for weeks to months as well that is very similar and that it comes and goes very quickly.  Came to the emergency department today for further evaluation regarding this.  He states that he has seen a neurologist in the past for his migraine headaches but they did not seem to go into detail in talking to him about his sharp shooting pains that he has been having.   Headache      Home Medications Prior to Admission medications   Medication Sig Start Date End Date Taking? Authorizing Provider  acetaminophen (TYLENOL) 500 MG tablet Take 500 mg by mouth every 6 (six) hours as needed  for moderate pain.    [provider]  alfuzosin (UROXATRAL) 10 MG 24 hr tablet Take 1 tablet (10 mg total) by mouth daily with breakfast. 04/11/23   [name], Mardene Celeste, MD  amLODipine (NORVASC) 5 MG tablet Take 1 tablet (5 mg total) by mouth daily. 01/12/23 04/12/23  Strader, Lennart Pall, PA-C  Azelastine-Fluticasone 137-50 MCG/ACT SUSP Place 1 spray into the nose every 12 (twelve) hours. 01/24/23   Del Nigel Berthold, FNP  Cholecalciferol (VITAMIN D3) 125 MCG (5000 UT) CAPS Take 1 capsule by mouth daily.     [provider]  cloNIDine (CATAPRES) 0.1 MG tablet Take 1 tablet (0.1 mg total) by mouth 2 (two) times daily as needed (SBP>160). Patient taking differently: Take 0.1 mg by mouth 3 (three) times daily. 11/27/22   Sherryll Burger, Pratik D, DO  ELIQUIS 5 MG TABS tablet TAKE 1 TABLET(5 MG) BY MOUTH TWICE DAILY 03/05/23   Wendall Stade, MD  furosemide (LASIX) 20 MG tablet Take 1 tablet (20 mg total) by mouth daily. Take with or after meal. 11/07/22   Marinus Maw, MD  Galcanezumab-gnlm [facility]) 120 MG/ML SOAJ Inject 1 Pen into the skin every 30 (thirty) days. 09/25/22   Ocie Doyne, MD  hydrOXYzine (ATARAX) 10 MG tablet Take 1 tablet (10 mg total) by mouth 3 (three) times daily as needed for anxiety. 11/26/22  Sherryll Burger, Pratik D, DO  hyoscyamine (LEVBID) 0.375 MG 12 hr tablet Take 0.375 mg by mouth 2 (two) times daily. 01/24/23   [provider]  loratadine (CLARITIN) 10 MG tablet Take 1 tablet (10 mg total) by mouth daily. 01/24/23   Del Nigel Berthold, FNP  losartan (COZAAR) 100 MG tablet Take 100 mg by mouth daily.    [provider]  methimazole (TAPAZOLE) 5 MG tablet Take 0.5 tablets (2.5 mg total) by mouth daily. 03/14/23   Roma Kayser, MD  metoprolol tartrate (LOPRESSOR) 25 MG tablet Take 1 tablet (25 mg total) by mouth 2 (two) times daily. TAKE EXTRA HALF TABLET  AS NEEDED 12/22/22 01/21/23  Sharlene Dory, PA-C  mirtazapine (REMERON) 7.5 MG tablet TAKE  1 TABLET(7.5 MG) BY MOUTH AT BEDTIME 04/18/23   Del Orbe Polanco, Iliana, FNP  NURTEC 75 MG TBDP Take 75 mg by mouth daily as needed (pain). 08/14/22   [provider]  ondansetron (ZOFRAN) 4 MG tablet Take 1 tablet (4 mg total) by mouth every 8 (eight) hours as needed for nausea or vomiting. 03/16/23   Del Newman Nip, Tenna Child, FNP  polyethylene glycol (MIRALAX / GLYCOLAX) 17 g packet Take 17 g by mouth daily. 08/23/22   Vassie Loll, MD  potassium chloride SA (KLOR-CON M) 20 MEQ tablet Take 1 tablet (20 mEq total) by mouth daily. 12/04/22   Jacalyn Lefevre, MD  pregabalin (LYRICA) 25 MG capsule Take 1 capsule (25 mg total) by mouth 2 (two) times daily. 04/19/23   Del Nigel Berthold, FNP  Probiotic Product (PROBIOTIC-10 PO) Take 1 capsule by mouth daily.    [provider]  Ubrogepant (UBRELVY) 100 MG TABS Take 1 tablet (100 mg total) by mouth as needed (for headaches). May repeat a dose in 2 hours if needed. Max dose 2 pills in 24 hours 09/25/22   Ocie Doyne, MD  vitamin B-12 (CYANOCOBALAMIN) 100 MCG tablet Take 100 mcg by mouth daily.    [provider]  gabapentin (NEURONTIN) 300 MG capsule Take 1 capsule (300 mg total) by mouth 3 (three) times daily as needed. 02/19/23   Del Nigel Berthold, FNP      Allergies    Latex and Meclizine    Review of Systems   Review of Systems  Cardiovascular:  Positive for chest pain.  Musculoskeletal:        Extremity pain  Neurological:  Positive for headaches.  All other systems reviewed and are negative.   Physical Exam Updated Vital Signs BP (!) 163/80 (BP Location: Left Arm)   Pulse (!) 54   Temp 97.9 F (36.6 C) (Oral)   Resp 17   Ht 5\' 7"  (1.702 m)   Wt 88.9 kg   SpO2 96%   BMI 30.70 kg/m  Physical Exam Vitals and nursing note reviewed.   Gen: NAD Eyes: PERRL, EOMI HEENT: no oropharyngeal swelling Neck: trachea midline, no meningismus Resp: clear to auscultation bilaterally Card: RRR, no  murmurs, rubs, or gallops Abd: nontender, nondistended Extremities: no calf tenderness, no edema Vascular: 2+ radial pulses bilaterally, 2+ DP pulses bilaterally Neuro: Cranial nerves intact, equal strength and sensation throughout bilateral upper and lower extremities Skin: no rashes Psyc: acting appropriately   ED Results / Procedures / Treatments   Labs (all labs ordered are listed, but only abnormal results are displayed) Labs Reviewed  BASIC METABOLIC PANEL  CBC  C-REACTIVE PROTEIN  TROPONIN I (HIGH SENSITIVITY)  TROPONIN I (HIGH SENSITIVITY)  EKG EKG Interpretation Date/Time:  Thursday April 19 2023 21:42:59 EDT Ventricular Rate:  52 PR Interval:  204 QRS Duration:  93 QT Interval:  448 QTC Calculation: 417 R Axis:   -10  Text Interpretation: Sinus rhythm Probable anteroseptal infarct, old Confirmed by Beckey Downing [phone]) on 04/19/2023 9:54:42 PM  Radiology DG Chest Port 1 View  Result Date: 04/19/2023 CLINICAL DATA:  Chest pain. EXAM: PORTABLE CHEST 1 VIEW COMPARISON:  01/07/2023. FINDINGS: The heart size and mediastinal contours are within normal limits. There is atherosclerotic calcification of the aorta. Stable atelectasis or scarring is noted at the left lung base. No consolidation, effusion, or pneumothorax. No acute osseous abnormality. IMPRESSION: No active disease. Electronically Signed   By: Thornell Sartorius M.D.   On: 04/19/2023 22:45   CT Head Wo Contrast  Result Date: 04/19/2023 CLINICAL DATA:  Headache, new onset.  Left-sided head pain. EXAM: CT HEAD WITHOUT CONTRAST TECHNIQUE: Contiguous axial images were obtained from the base of the skull through the vertex without intravenous contrast. RADIATION DOSE REDUCTION: This exam was performed according to the departmental dose-optimization program which includes automated exposure control, adjustment of the mA and/or kV according to patient size and/or use of iterative reconstruction technique. COMPARISON:   12/08/2022. FINDINGS: Brain: No acute intracranial hemorrhage, midline shift or mass effect. No extra-axial fluid collection. Gray-white matter differentiation is within normal limits. No hydrocephalus. Vascular: No hyperdense vessel or unexpected calcification. Skull: Normal. Negative for fracture or focal lesion. Sinuses/Orbits: No acute finding. Other: None. IMPRESSION: No acute intracranial process. Electronically Signed   By: Thornell Sartorius M.D.   On: 04/19/2023 22:43    Procedures Procedures    Medications Ordered in ED Medications  cloNIDine (CATAPRES) tablet 0.1 mg (0.1 mg Oral Given 04/19/23 2138)    ED Course/ Medical Decision Making/ A&P                                 Medical Decision Making 72 year old male with past medical history of atrial fibrillation on Eliquis, hypertension, and hyperlipidemia presenting to the emergency department today with shooting pains in his head, chest, extremities, and other various parts of his body that been going now for months.  Given his risk factors I will further evaluate him here with basic labs Wels and EKG, chest x-ray, and troponin for further evaluation for atypical ACS, pulmonary edema, pulmonary infiltrates, or pneumothorax.  Will also obtain a CT scan of his head to eval for intracranial hemorrhage or mass lesion.  Given the description of his symptoms suspicion for subarachnoid hemorrhage is low at this time.  I will obtain basic labs here as well as a CRP to screen for temporal arteritis although suspicion for this is low as well given the description of his symptoms but given his age and new quality to his headaches I will does get further with this.  The patient's EKG interpreted by me shows a sinus rhythm with a rate of 52 with normal axis, normal intervals, nonspecific ST-T changes.  The patient initial labs are unremarkable.  CRP is pending.  CT scan of his head is negative.  The patient is feeling well on reassessment.  If his CRP  is negative he will be discharged.  Signed out to oncoming provider at 23:30.  Amount and/or Complexity of Data Reviewed Labs: ordered. Radiology: ordered.  Risk Prescription drug management.           Final  Clinical Impression(s) / ED Diagnoses Final diagnoses:  Nonintractable headache, unspecified chronicity pattern, unspecified headache type    Rx / DC Orders ED Discharge Orders     None         Durwin Glaze, MD 04/19/23 2329

## 2023-04-19 NOTE — ED Provider Triage Note (Signed)
Emergency Medicine Provider Triage Evaluation Note  Steven [name] , a 72 y.o. male  was evaluated in triage.  Pt complains of sharp pain to the left temporal and TMJ area that lasted 3 seconds today, has had this pain before but never lasting more than about a second, states he had a second episode that was more brief but usually does not have 2 episodes at a time and is concerned and said to come to the ER.  He has no headache now, no vision changes, no numbness tingling or weakness..  Review of Systems  Positive: Pain to left temporal area Negative: Fever, neck pain, nausea or vomiting, vision changes, jaw claudication  Physical Exam  BP 133/70 (BP Location: Right Arm)   Pulse (!) 55   Temp 97.7 F (36.5 C)   Resp 18   Ht 5\' 7"  (1.702 m)   Wt 88.9 kg   SpO2 100%   BMI 30.70 kg/m  Gen:   Awake, no distress   Resp:  Normal effort  MSK:   Moves extremities without difficulty  Other:    Medical Decision Making  Medically screening exam initiated at 8:24 PM.  Appropriate orders placed.  SVEN TRIBE was informed that the remainder of the evaluation will be completed by another provider, this initial triage assessment does not replace that evaluation, and the importance of remaining in the ED until their evaluation is complete.     Carmel Sacramento A, PA-C 04/19/23 2025

## 2023-04-19 NOTE — Telephone Encounter (Signed)
Error

## 2023-04-19 NOTE — Discharge Instructions (Addendum)
Your workup today was reassuring.  Please follow up with your neurologist for further evaluation.  Return to the ER for worsening symptoms.

## 2023-04-19 NOTE — Assessment & Plan Note (Signed)
Patient interested in therapy On Remeron 7.5 mg at bedtime Patient verbally consented to [facility] services about presenting concerns and psychiatric consultation as appropriate. The services will be billed as appropriate for the patient.    Continued discussion on lifestyle changes, establishing a daily routine, going outdoors, exercise, healthy eating habits, mindfulness and mediatation.

## 2023-04-19 NOTE — Assessment & Plan Note (Signed)
Trial on Lyrica 25 mg twice daily  Referral placed to pain management

## 2023-04-19 NOTE — Progress Notes (Signed)
Patient Office Visit   Subjective   Patient ID: Steven [name], male    DOB: 05/02/1951  Age: 72 y.o. MRN: [id number]  CC:  Chief Complaint  Patient presents with   Follow-up    1 week f/u needs a referral.     HPI Steven [name] 72 year old male, presents to the clinic for worsening chronic pain.He  has a past medical history of Anxiety, Arthritis, Atrial fibrillation (HCC), Atrial flutter (HCC), BPH (benign prostatic hyperplasia), Chest pain, Dizziness, Dyspnea, Fracture (08/17/2015), GERD (gastroesophageal reflux disease), Hemorrhoids, History of kidney stones, History of radiation therapy (08/22/11-10/13/11), Hyperlipemia, Hypertension, Hyperthyroidism, IBS (irritable bowel syndrome), Insomnia, Light headedness, Migraine, Numbness and tingling in left arm, Numbness and tingling of both legs, Open fracture of left elbow (08/18/2015), Prostate cancer (HCC), Rib fractures (08/17/2015), and Tinnitus.  Pain  He reports chronic  pain diffuse states his pain all over his body.. There was no an injury that may have caused the pain. The pain started several months ago and is gradually worsening. The pain does radiate abdominal, lower, upper back, bilateral legs The pain is described as aching, pinching, sharp, stabbing, and throbbing, is 7/10 in intensity, occurring intermittently. Symptoms are worse in the: evening Aggravating factors: when patient is still and can not sleep due the pain Relieving factors: Activity. He has tried acetaminophen and gabapentin  with mild relief.          Outpatient Encounter Medications as of 04/19/2023  Medication Sig   acetaminophen (TYLENOL) 500 MG tablet Take 500 mg by mouth every 6 (six) hours as needed for moderate pain.   alfuzosin (UROXATRAL) 10 MG 24 hr tablet Take 1 tablet (10 mg total) by mouth daily with breakfast.   Azelastine-Fluticasone 137-50 MCG/ACT SUSP Place 1 spray into the nose every 12 (twelve) hours.   Cholecalciferol (VITAMIN D3) 125  MCG (5000 UT) CAPS Take 1 capsule by mouth daily.    cloNIDine (CATAPRES) 0.1 MG tablet Take 1 tablet (0.1 mg total) by mouth 2 (two) times daily as needed (SBP>160). (Patient taking differently: Take 0.1 mg by mouth 3 (three) times daily.)   ELIQUIS 5 MG TABS tablet TAKE 1 TABLET(5 MG) BY MOUTH TWICE DAILY   furosemide (LASIX) 20 MG tablet Take 1 tablet (20 mg total) by mouth daily. Take with or after meal.   Galcanezumab-gnlm (EMGALITY) 120 MG/ML SOAJ Inject 1 Pen into the skin every 30 (thirty) days.   hydrOXYzine (ATARAX) 10 MG tablet Take 1 tablet (10 mg total) by mouth 3 (three) times daily as needed for anxiety.   hyoscyamine (LEVBID) 0.375 MG 12 hr tablet Take 0.375 mg by mouth 2 (two) times daily.   loratadine (CLARITIN) 10 MG tablet Take 1 tablet (10 mg total) by mouth daily.   losartan (COZAAR) 100 MG tablet Take 100 mg by mouth daily.   methimazole (TAPAZOLE) 5 MG tablet Take 0.5 tablets (2.5 mg total) by mouth daily.   mirtazapine (REMERON) 7.5 MG tablet TAKE 1 TABLET(7.5 MG) BY MOUTH AT BEDTIME   NURTEC 75 MG TBDP Take 75 mg by mouth daily as needed (pain).   ondansetron (ZOFRAN) 4 MG tablet Take 1 tablet (4 mg total) by mouth every 8 (eight) hours as needed for nausea or vomiting.   polyethylene glycol (MIRALAX / GLYCOLAX) 17 g packet Take 17 g by mouth daily.   potassium chloride SA (KLOR-CON M) 20 MEQ tablet Take 1 tablet (20 mEq total) by mouth daily.   pregabalin (LYRICA) 25  MG capsule Take 1 capsule (25 mg total) by mouth 2 (two) times daily.   Probiotic Product (PROBIOTIC-10 PO) Take 1 capsule by mouth daily.   Ubrogepant (UBRELVY) 100 MG TABS Take 1 tablet (100 mg total) by mouth as needed (for headaches). May repeat a dose in 2 hours if needed. Max dose 2 pills in 24 hours   vitamin B-12 (CYANOCOBALAMIN) 100 MCG tablet Take 100 mcg by mouth daily.   [DISCONTINUED] gabapentin (NEURONTIN) 300 MG capsule Take 1 capsule (300 mg total) by mouth 3 (three) times daily.    amLODipine (NORVASC) 5 MG tablet Take 1 tablet (5 mg total) by mouth daily.   metoprolol tartrate (LOPRESSOR) 25 MG tablet Take 1 tablet (25 mg total) by mouth 2 (two) times daily. TAKE EXTRA HALF TABLET  AS NEEDED   [DISCONTINUED] gabapentin (NEURONTIN) 300 MG capsule Take 1 capsule (300 mg total) by mouth 3 (three) times daily as needed.   No facility-administered encounter medications on file as of 04/19/2023.    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 Normal appearing colon   COLONOSCOPY WITH PROPOFOL N/A 10/23/2019   Procedure: COLONOSCOPY WITH PROPOFOL;  Surgeon: Corbin Ade, MD; external and grade 2 internal hemorrhoids, abnormal rectal blood vessels consistent with radiation proctitis s/p APC therapy, otherwise normal exam.   EVALUATION UNDER ANESTHESIA WITH ANAL FISTULECTOMY N/A 01/14/2020   Procedure: ANORECTAL EXAM UNDER ANESTHESIA;  Surgeon: Andria Meuse, MD;  Location: [facility];  Service: General;  Laterality: N/A;   HOT HEMOSTASIS  10/23/2019   Procedure: HOT HEMOSTASIS (ARGON PLASMA COAGULATION/BICAP);  Surgeon: Corbin Ade, MD;  Location: [facility];  Service: Endoscopy;;  apc rectal proctitis     KIDNEY SURGERY  1982   kidney tube collapse repair   RADIOACTIVE SEED IMPLANT     Prostate   SVT ABLATION N/A 12/12/2022   Procedure: SVT ABLATION;  Surgeon: Marinus Maw, MD;  Location: [facility];  Service: Cardiovascular;  Laterality: N/A;    Review of Systems  Constitutional:  Negative for chills and fever.  Eyes:  Negative for blurred vision.  Respiratory:  Negative for shortness of breath.   Cardiovascular:  Negative for  palpitations.  Gastrointestinal:  Positive for abdominal pain.  Musculoskeletal:  Positive for back pain, joint pain, myalgias and neck pain.  Neurological:  Positive for dizziness and headaches.      Objective    BP 137/84   Pulse 60   Ht 5\' 7"  (1.702 m)   Wt 196 lb 1.9 oz (89 kg)   SpO2 97%   BMI 30.72 kg/m   Physical Exam Vitals reviewed.  Constitutional:      General: He is not in acute distress.    Appearance: Normal appearance. He is not ill-appearing, toxic-appearing or diaphoretic.  HENT:     Head: Normocephalic.  Eyes:     General:        Right eye: No discharge.        Left eye: No discharge.     Conjunctiva/sclera: Conjunctivae normal.  Cardiovascular:     Rate and Rhythm: Normal rate.     Pulses: Normal pulses.     Heart  sounds: Normal heart sounds.  Pulmonary:     Effort: Pulmonary effort is normal. No respiratory distress.     Breath sounds: Normal breath sounds.  Musculoskeletal:     Cervical back: Normal range of motion.  Skin:    General: Skin is warm and dry.     Capillary Refill: Capillary refill takes less than 2 seconds.  Neurological:     General: No focal deficit present.     Mental Status: He is alert.     Coordination: Coordination normal.     Gait: Gait normal.  Psychiatric:        Mood and Affect: Mood normal.       Assessment & Plan:  Chronic pain syndrome Assessment & Plan: Trial on Lyrica 25 mg twice daily  Referral placed to pain management   Orders: -     Ambulatory referral to Pain Clinic  Anxiety and depression -     Ambulatory referral to Behavioral Health  Depression, recurrent [facility]) Assessment & Plan: Patient interested in therapy On Remeron 7.5 mg at bedtime Patient verbally consented to [facility] services about presenting concerns and psychiatric consultation as appropriate. The services will be billed as appropriate for the patient.    Continued discussion on lifestyle changes,  establishing a daily routine, going outdoors, exercise, healthy eating habits, mindfulness and mediatation.    Other orders -     Pregabalin; Take 1 capsule (25 mg total) by mouth 2 (two) times daily.  Dispense: 60 capsule; Refill: 2    Return if symptoms worsen or fail to improve.   Cruzita Lederer Newman Nip, FNP

## 2023-04-19 NOTE — Telephone Encounter (Signed)
Patient is requesting call back from someone to discuss symptoms he has experienced with sharp pains in different parts of his body, but more specifically in left side of head and neck. He states he is currently at ED and believes they will not be able to answer his questions or concerns. Would like to know if he should be seen in office sooner than 10/04.

## 2023-04-20 LAB — SEDIMENTATION RATE: Sed Rate: 5 mm/h (ref 0–16)

## 2023-04-20 LAB — C-REACTIVE PROTEIN: CRP: 0.6 mg/dL (ref <1.0)

## 2023-04-20 NOTE — Telephone Encounter (Signed)
Patient seen in ER for non intractable headache and d/c'd.

## 2023-04-23 NOTE — Progress Notes (Signed)
Cardiology Office Note    Date:  04/27/2023  ID:  Steven [name], DOB 1950-11-22, MRN [id number] Cardiologist: Charlton Haws, MD   EP: Dr. Ladona Ridgel  History of Present Illness:    Steven [name] is a 72 y.o. male with past medical history of paroxysmal atrial fibrillation/flutter, SVT, hyperthyroidism, HTN, HLD and IBS who presents to the office today for Emergency Department follow-up.  He was examined by Steven Favre, PA-C in 11/2022 following his recent ablation and reported that he did not feel better after the procedure and was having headaches and his blood pressure had been significantly variable, at 200/100 when checked at home. He was continued on Lopressor 25 mg twice daily and was advised to take an extra 12.5 if needed for recurrent SVT. A follow-up monitor was arranged to assess for any recurrent arrhythmias. He did see Dr. Ladona Ridgel on 01/04/2023 and was mostly reporting GI issues at that time. His monitor 62130 showed predominantly normal sinus rhythm with an average heart rate of 58 bpm and rare PAC's and PVC's but less than 1% burden.   He presented back to the ED on 01/07/2023 for evaluation of palpitations and chest discomfort.  BNP was normal at 63 and Hs Troponin values were negative at 9 and 12. He was in normal sinus rhythm but BP was elevated at 171/78. It appears he received Hydralazine and was discharged home.  He has GAD with behavioral health issues Frequent ER visits. Most recently 04/19/23 was d/c home after evaluation for headache  CT head normal   BP up discussed increasing norvasc   Studies Reviewed:   EKG: EKG is not ordered today. EKG from 01/07/2023 is reviewed and demonstrates NSR, HR 65 with isolated TWI along Lead III which is similar to prior tracings.   Echocardiogram: 07/2022 IMPRESSIONS     1. Left ventricular ejection fraction, by estimation, is 60 to 65%. The  left ventricle has normal function. The left ventricle has no regional  wall motion  abnormalities. Left ventricular diastolic parameters are  consistent with Grade I diastolic  dysfunction (impaired relaxation).   2. Right ventricular systolic function is normal. The right ventricular  size is normal. Tricuspid regurgitation signal is inadequate for assessing  PA pressure.   3. Left atrial size was mildly dilated.   4. The mitral valve is normal in structure. No evidence of mitral valve  regurgitation. No evidence of mitral stenosis.   5. The aortic valve is tricuspid. There is moderate calcification of the  aortic valve. Aortic valve regurgitation is not visualized. No aortic  stenosis is present.   6. The inferior vena cava is normal in size with greater than 50%  respiratory variability, suggesting right atrial pressure of 3 mmHg.   Comparison(s): No significant change from prior study.   NST: 08/2022   Stress ECG is negative for ischemia.   LV perfusion is normal. There is no evidence of ischemia. There is no evidence of infarction.   Left ventricular function is normal. Nuclear stress EF: 78 %.   The study is normal. The study is low risk.  SVT Ablation: 11/2022 CONCLUSIONS:  1. Sinus rhythm upon presentation.  2. The patient had dual AV nodal physiology with easily inducible classic AV nodal reentrant tachycardia, there were no other accessory pathways or arrhythmias induced  3. Successful radiofrequency modification of the slow AV nodal pathway  4. No inducible arrhythmias following ablation.  5. No early apparent complications.   Zio Patch: 12/2022 Patch  Wear Time:  4 days and 3 hours (2024-06-05T16:37:45-0400 to 2024-06-09T19:39:03-399)   Patient had a min HR of 43 bpm, max HR of 123 bpm, and avg HR of 58 bpm. Predominant underlying rhythm was Sinus Rhythm. Isolated SVEs were rare (<1.0%), SVE Couplets were rare (<1.0%), and SVE Triplets were rare (<1.0%). Isolated VEs were rare (<1.0%),  and no VE Couplets or VE Triplets were present. Ventricular  Trigeminy was present.    Physical Exam:   VS:  BP (!) 146/94 (BP Location: Right Arm, Patient Position: Sitting, Cuff Size: Normal)   Pulse (!) 56   Ht 5\' 7"  (1.702 m)   Wt 195 lb (88.5 kg)   SpO2 95%   BMI 30.54 kg/m    Wt Readings from Last 3 Encounters:  04/27/23 195 lb (88.5 kg)  04/19/23 195 lb 15.8 oz (88.9 kg)  04/19/23 196 lb 1.9 oz (89 kg)     GEN: Well nourished, well developed male appearing in no acute distress NECK: No JVD; No carotid bruits CARDIAC: RRR, no murmurs, rubs, gallops RESPIRATORY:  Clear to auscultation without rales, wheezing or rhonchi  ABDOMEN: Appears non-distended. No obvious abdominal masses. EXTREMITIES: No clubbing or cyanosis. No pitting edema.  Distal pedal pulses are 2+ bilaterally.   Assessment and Plan:   1. Paroxysmal Atrial Fibrillation/Flutter - He was maintaining normal sinus rhythm by recent event monitor and is in NSR by examination today. Remains on Lopressor 25 mg twice daily for rate-control and he is on Eliquis 5 mg twice daily for anticoagulation. Recent CBC earlier this month showed his hemoglobin was stable at 13.3 with platelets at 197 K.  2. SVT - He did meet with Dr. Ladona Ridgel in 10/2022 and underwent successful SVT ablation in 11/2022 with no immediate complications noted. Follow-up monitor earlier this month showed predominantly normal sinus rhythm with no episodes of SVT. - Reviewed monitor results in detail with the patient today and by the description of his symptoms, I suspect he is feeling his PVC's and reviewed that this was less than 1% burden by his monitor which is benign. Continue Lopressor 25 mg twice daily. Would not further titrate given his heart rate in the low-60's. He is aware he can take an extra half-tablet if needed.  3. HTN - His blood pressure is well-controlled  Hydralazine changed to Amlodipine 5 mg daily. ? Causing headaches.Continue Losartan 100 mg daily and Lopressor 25 mg twice daily.  Increase  norvasc to 10 mg   4. History of Chest Pain - He is s/p multiple stress tests with the most recent being in 08/2022 which showed no evidence of ischemia and was a low-risk study. Consider a cardiac catheterization with Dr. Lynnette Caffey to do a vasoactive study to assess for small vessel disease once his thyroid function normalized. - At this time, his episodes of pain seem very atypical for a cardiac etiology and he has multiple GI issues. We reviewed that since his thyroid has normalized, we could consider a Coronary CT or cardiac catheterization as previously discussed but he wishes to hold off on this for now which I think is very reasonable given his atypical symptoms.  Increase Norvasc 10 mg Importantly he has f/u with psychiatry  F/U in a year    Signed, Charlton Haws, MD

## 2023-04-25 ENCOUNTER — Ambulatory Visit (HOSPITAL_COMMUNITY): Payer: Medicare PPO

## 2023-04-27 ENCOUNTER — Ambulatory Visit: Payer: Medicare PPO | Attending: Cardiovascular Disease | Admitting: Cardiovascular Disease

## 2023-04-27 ENCOUNTER — Encounter: Payer: Self-pay | Admitting: Cardiovascular Disease

## 2023-04-27 VITALS — BP 146/94 | HR 56 | Ht 67.0 in | Wt 195.0 lb

## 2023-04-27 DIAGNOSIS — I471 Supraventricular tachycardia, unspecified: Secondary | ICD-10-CM | POA: Diagnosis not present

## 2023-04-27 DIAGNOSIS — I10 Essential (primary) hypertension: Secondary | ICD-10-CM

## 2023-04-27 DIAGNOSIS — E0590 Thyrotoxicosis, unspecified without thyrotoxic crisis or storm: Secondary | ICD-10-CM

## 2023-04-27 DIAGNOSIS — R0789 Other chest pain: Secondary | ICD-10-CM | POA: Diagnosis not present

## 2023-04-27 DIAGNOSIS — I4892 Unspecified atrial flutter: Secondary | ICD-10-CM

## 2023-04-27 MED ORDER — AMLODIPINE BESYLATE 10 MG PO TABS: 10.0000 mg | ORAL_TABLET | Freq: Every day | ORAL | 3 refills | Status: DC

## 2023-04-27 NOTE — Patient Instructions (Signed)
Medication Instructions:  Your physician has recommended you make the following change in your medication:  -Increase Norvasc to 10 mg once daily.   *If you need a refill on your cardiac medications before your next appointment, please call your pharmacy*   Lab Work: None If you have labs (blood work) drawn today and your tests are completely normal, you will receive your results only by: MyChart Message (if you have MyChart) OR A paper copy in the mail If you have any lab test that is abnormal or we need to change your treatment, we will call you to review the results.   Testing/Procedures: None   Follow-Up: At [facility], you and your health needs are our priority.  As part of our continuing mission to provide you with exceptional heart care, we have created designated Provider Care Teams.  These Care Teams include your primary Cardiologist (physician) and Advanced Practice Providers (APPs -  Physician Assistants and Nurse Practitioners) who all work together to provide you with the care you need, when you need it.  We recommend signing up for the patient portal called "MyChart".  Sign up information is provided on this After Visit Summary.  MyChart is used to connect with patients for Virtual Visits (Telemedicine).  Patients are able to view lab/test results, encounter notes, upcoming appointments, etc.  Non-urgent messages can be sent to your provider as well.   To learn more about what you can do with MyChart, go to [email or web address].    Your next appointment:   1 year(s)  Provider:   You may see Charlton Haws, MD or one of the following Advanced Practice Providers on your designated Care Team:   Randall An, PA-C  Jacolyn Reedy, [place]     Other Instructions

## 2023-05-01 ENCOUNTER — Ambulatory Visit: Payer: Self-pay | Admitting: *Deleted

## 2023-05-01 NOTE — Patient Outreach (Addendum)
 Care Coordination   Initial Visit Note   05/02/2023 Name: Steven [name] MRN: [id number] DOB: 1951-04-26  Steven [name] is a 72 y.o. year old male who sees Del Newman Nip, [place], FNP for primary care. I spoke with  [name] by phone today.  What matters to the patients health and wellness today?  Constipation, neuropathy pain/pain management  Constipation- strains to have a bowel movement Will drink more water with his miralax to assist it with working appropriately  Get nauseated and has difficulty with sweating and increase chest pain when having stools    Pain management- to see new pain management MD at [facility] medical clinic on 05/02/23 as ordered by his pcp  Pain is all over when resting but recently he noted pain in his toe, teeth & finger  Had times when pain went from top of his head radiating down both sides of his head while trying to wash dishes Sometimes it feels like a bolt of lightening and/or like bee stings, Legs get weak. Denies falls He reports in the last few weeks he has been staying in the bed He uses Tylenol like every 4-6 hours  Voices understanding of neurological diagnoses like neuropathy, his risk for a stroke  Past medical history per patient of surgery in 1982, no diabetes, no back surgery He did have radiation in 2012 for   He reports his present neurologist is only treating him for headaches not the other neurological pain he describes   He informs RN CM "no one has been able to find out what this is "    Goals Addressed             This Visit's Progress    Manage Pain Effectively-care coordination services       Care Coordination Goals:   Patient will work with pain management MD  Patient will reach out to provider with any new or worsening symptoms Patient will reach out to RN Care Coordinator at [phone] with any resource or care coordination needs Interventions Today    Flowsheet Row Most Recent Value  Chronic Disease    Chronic disease during today's visit Atrial Fibrillation (AFib), Other  [constipation, chronic pain symptoms/pending pain management services]  General Interventions   General Interventions Discussed/Reviewed General Interventions Discussed, Doctor Visits  Doctor Visits Discussed/Reviewed Doctor Visits Discussed, PCP, Specialist  [discussed and answered questions about upcoming pain managment provider & office visit]  PCP/Specialist Visits Compliance with follow-up visit  Exercise Interventions   Exercise Discussed/Reviewed Exercise Discussed, Physical Activity, Assistive device use and maintanence  [discussed his mobility He confirms painful to walk but confirms he is able to walk and assist a gentleman with his yard work]  Physical Activity Discussed/Reviewed Physical Activity Discussed, Home Exercise Program (HEP)  Education Interventions   Education Provided Provided Education, Provided Web-based Education  [pain managment, neuropathy, nerve medical conditions]  Provided Verbal Education On Mental Health/Coping with Illness, Exercise, Medication  Mental Health Interventions   Mental Health Discussed/Reviewed Mental Health Discussed, Coping Strategies  Pharmacy Interventions   Pharmacy Dicussed/Reviewed Pharmacy Topics Discussed, Medications and their functions, Affording Medications  Safety Interventions   Safety Discussed/Reviewed Safety Discussed, Home Safety  Home Safety Assistive Devices              SDOH assessments and interventions completed:  Yes     Care Coordination Interventions:  Yes, provided   Follow up plan: Follow up call scheduled for 05/08/23    Encounter Outcome:  Patient Visit Completed   Cala Bradford L. Noelle Penner, RN, BSN, [facility]  VBCI Care Management Coordinator  [phone]  Fax: [phone]

## 2023-05-01 NOTE — Patient Instructions (Signed)
 Visit Information  Thank you for taking time to visit with me today. Please don't hesitate to contact me if I can be of assistance to you.   Following are the goals we discussed today:   Goals Addressed             This Visit's Progress    Manage Pain Effectively-care coordination services       Care Coordination Goals:   Patient will work with pain management MD  Patient will reach out to provider with any new or worsening symptoms Patient will reach out to RN Care Coordinator at [phone] with any resource or care coordination needs Interventions Today    Flowsheet Row Most Recent Value  Chronic Disease   Chronic disease during today's visit Atrial Fibrillation (AFib), Other  [constipation, chronic pain symptoms/pending pain management services]  General Interventions   General Interventions Discussed/Reviewed General Interventions Discussed, Doctor Visits  Doctor Visits Discussed/Reviewed Doctor Visits Discussed, PCP, Specialist  [discussed and answered questions about upcoming pain managment provider & office visit]  PCP/Specialist Visits Compliance with follow-up visit  Exercise Interventions   Exercise Discussed/Reviewed Exercise Discussed, Physical Activity, Assistive device use and maintanence  [discussed his mobility He confirms painful to walk but confirms he is able to walk and assist a gentleman with his yard work]  Physical Activity Discussed/Reviewed Physical Activity Discussed, Home Exercise Program (HEP)  Education Interventions   Education Provided Provided Education, Provided Web-based Education  [pain managment, neuropathy, nerve medical conditions]  Provided Verbal Education On Mental Health/Coping with Illness, Exercise, Medication  Mental Health Interventions   Mental Health Discussed/Reviewed Mental Health Discussed, Coping Strategies  Pharmacy Interventions   Pharmacy Dicussed/Reviewed Pharmacy Topics Discussed, Medications and their functions,  Affording Medications  Safety Interventions   Safety Discussed/Reviewed Safety Discussed, Home Safety  Home Safety Assistive Devices              Our next appointment is by telephone on 05/08/23 at 3:30 pm  Please call the care guide team at [phone] if you need to cancel or reschedule your appointment.   If you are experiencing a Mental Health or Behavioral Health Crisis or need someone to talk to, please call the Suicide and Crisis Lifeline: 988 call the [place] National Suicide Prevention Lifeline: (947) 435-4331 or TTY: (380) 721-9387 TTY 608-310-2215) to talk to a trained counselor call 1-800-273-TALK (toll free, 24 hour hotline) call the [facility]: (727) 703-3196 call 911   Patient verbalizes understanding of instructions and care plan provided today and agrees to view in MyChart. Active MyChart status and patient understanding of how to access instructions and care plan via MyChart confirmed with patient.     The patient has been provided with contact information for the care management team and has been advised to call with any health related questions or concerns.   Khristie Sak L. Noelle Penner, RN, BSN, [facility]  VBCI Care Management Coordinator  [phone]  Fax: [phone]

## 2023-05-02 ENCOUNTER — Encounter: Payer: Self-pay | Admitting: *Deleted

## 2023-05-02 ENCOUNTER — Telehealth: Payer: Self-pay | Admitting: Family Medicine

## 2023-05-02 DIAGNOSIS — G894 Chronic pain syndrome: Secondary | ICD-10-CM | POA: Diagnosis not present

## 2023-05-02 NOTE — Telephone Encounter (Signed)
Steven [name], see if [facility] can place another referral for pain management for a different, I did start patient on Lyrica for pain last office visit

## 2023-05-02 NOTE — Telephone Encounter (Signed)
Patient went to a pain management provider today and that provider told patient there is nothing that doctor could do for him they had no idea why he was there, had no information. Patient asking  for a call back to discuss with provider and need a new referral to different location. [phone].

## 2023-05-03 ENCOUNTER — Encounter: Payer: Self-pay | Admitting: Internal Medicine

## 2023-05-03 ENCOUNTER — Ambulatory Visit: Payer: Medicare PPO | Attending: Internal Medicine | Admitting: Internal Medicine

## 2023-05-03 ENCOUNTER — Other Ambulatory Visit: Payer: Self-pay

## 2023-05-03 ENCOUNTER — Ambulatory Visit: Payer: Medicare HMO | Admitting: Internal Medicine

## 2023-05-03 VITALS — BP 138/62 | HR 58 | Ht 67.0 in | Wt 199.0 lb

## 2023-05-03 DIAGNOSIS — R002 Palpitations: Secondary | ICD-10-CM | POA: Diagnosis not present

## 2023-05-03 DIAGNOSIS — G894 Chronic pain syndrome: Secondary | ICD-10-CM

## 2023-05-03 NOTE — Telephone Encounter (Signed)
Patient called back to let provider know his pain is getting worse and at times severe pain

## 2023-05-03 NOTE — Progress Notes (Signed)
HPI [name] returns today for followup of his SVT. He is a pleasant 72 yo man with multiple medical problems who had SVT at over 200/min. He underwent successful ablation of AVNRT and was non-inducible after his procedure. I saw him several months ago and he described recurrent symptoms that he thought might be due to SVT. He has just finished wearing a cardiac monitor which demonstrated NSR and only rare PVC's and PAC's but no SVT. He has multiple non-specific aches and pains from his left foot to his head. Allergies  Allergen Reactions   Latex Rash    Possible reaction to latex gloves per patient   Meclizine     Other reaction(s): Abdominal Pain, Other     Current Outpatient Medications  Medication Sig Dispense Refill   acetaminophen (TYLENOL) 500 MG tablet Take 500 mg by mouth every 6 (six) hours as needed for moderate pain.     alfuzosin (UROXATRAL) 10 MG 24 hr tablet Take 1 tablet (10 mg total) by mouth daily with breakfast. 90 tablet 3   amLODipine (NORVASC) 10 MG tablet Take 1 tablet (10 mg total) by mouth daily. 90 tablet 3   Azelastine-Fluticasone 137-50 MCG/ACT SUSP Place 1 spray into the nose every 12 (twelve) hours. 23 g 1   Cholecalciferol (VITAMIN D3) 125 MCG (5000 UT) CAPS Take 1 capsule by mouth daily.      cloNIDine (CATAPRES) 0.1 MG tablet Take 1 tablet (0.1 mg total) by mouth 2 (two) times daily as needed (SBP>160). (Patient taking differently: Take 0.1 mg by mouth 3 (three) times daily.) 60 tablet 11   ELIQUIS 5 MG TABS tablet TAKE 1 TABLET(5 MG) BY MOUTH TWICE DAILY 60 tablet 5   furosemide (LASIX) 20 MG tablet Take 1 tablet (20 mg total) by mouth daily. Take with or after meal. 90 tablet 3   Galcanezumab-gnlm (EMGALITY) 120 MG/ML SOAJ Inject 1 Pen into the skin every 30 (thirty) days. 1.12 mL 7   hydrOXYzine (ATARAX) 10 MG tablet Take 1 tablet (10 mg total) by mouth 3 (three) times daily as needed for anxiety. 30 tablet 0   hyoscyamine (LEVBID) 0.375 MG 12 hr  tablet Take 0.375 mg by mouth 2 (two) times daily.     loratadine (CLARITIN) 10 MG tablet Take 1 tablet (10 mg total) by mouth daily. 30 tablet 11   losartan (COZAAR) 100 MG tablet Take 100 mg by mouth daily.     methimazole (TAPAZOLE) 5 MG tablet Take 0.5 tablets (2.5 mg total) by mouth daily. 45 tablet 1   mirtazapine (REMERON) 7.5 MG tablet TAKE 1 TABLET(7.5 MG) BY MOUTH AT BEDTIME 30 tablet 1   NURTEC 75 MG TBDP Take 75 mg by mouth daily as needed (pain).     ondansetron (ZOFRAN) 4 MG tablet Take 1 tablet (4 mg total) by mouth every 8 (eight) hours as needed for nausea or vomiting. 20 tablet 1   polyethylene glycol (MIRALAX / GLYCOLAX) 17 g packet Take 17 g by mouth daily. 30 each 2   potassium chloride SA (KLOR-CON M) 20 MEQ tablet Take 1 tablet (20 mEq total) by mouth daily. 30 tablet 0   pregabalin (LYRICA) 25 MG capsule Take 1 capsule (25 mg total) by mouth 2 (two) times daily. 60 capsule 2   Probiotic Product (PROBIOTIC-10 PO) Take 1 capsule by mouth daily.     Ubrogepant (UBRELVY) 100 MG TABS Take 1 tablet (100 mg total) by mouth as needed (for headaches). May  repeat a dose in 2 hours if needed. Max dose 2 pills in 24 hours 16 tablet 6   vitamin B-12 (CYANOCOBALAMIN) 100 MCG tablet Take 100 mcg by mouth daily.     metoprolol tartrate (LOPRESSOR) 25 MG tablet Take 1 tablet (25 mg total) by mouth 2 (two) times daily. TAKE EXTRA HALF TABLET  AS NEEDED 180 tablet 2   No current facility-administered medications for this visit.     Past Medical History:  Diagnosis Date   Anxiety    Arthritis    Atrial fibrillation (HCC)    Atrial flutter (HCC)    BPH (benign prostatic hyperplasia)    Chest pain    Dizziness    Dyspnea    laying down occ   Fracture 08/17/2015   MULTIPLE RIB FRACTURES     FROM FALL    GERD (gastroesophageal reflux disease)    Hemorrhoids    History of kidney stones    noted on CT scan   History of radiation therapy 08/22/11-10/13/11   prostate   Hyperlipemia     Hypertension    Hyperthyroidism    IBS (irritable bowel syndrome)    Insomnia    Light headedness    Migraine    Numbness and tingling in left arm    Numbness and tingling of both legs    Open fracture of left elbow 08/18/2015   Prostate cancer [facility])    prostate s/p radiation Mar 2013   Rib fractures 08/17/2015   Tinnitus     ROS:   All systems reviewed and negative except as noted in the HPI.   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 Normal appearing colon   COLONOSCOPY WITH PROPOFOL N/A 10/23/2019   Procedure: COLONOSCOPY WITH PROPOFOL;  Surgeon: Corbin Ade, MD; external and grade 2 internal hemorrhoids, abnormal rectal blood vessels consistent with radiation proctitis s/p APC therapy, otherwise normal exam.   EVALUATION UNDER ANESTHESIA WITH ANAL FISTULECTOMY N/A 01/14/2020   Procedure: ANORECTAL EXAM UNDER ANESTHESIA;  Surgeon: Andria Meuse, MD;  Location: [facility];  Service: General;  Laterality: N/A;   HOT HEMOSTASIS  10/23/2019   Procedure: HOT HEMOSTASIS (ARGON PLASMA COAGULATION/BICAP);  Surgeon: Corbin Ade, MD;  Location: [facility];  Service: Endoscopy;;  apc rectal proctitis     KIDNEY SURGERY  1982   kidney tube collapse repair   RADIOACTIVE SEED IMPLANT     Prostate   SVT ABLATION N/A 12/12/2022   Procedure: SVT ABLATION;  Surgeon: Marinus Maw, MD;  Location: [facility] INVASIVE CV LAB;  Service: Cardiovascular;  Laterality: N/A;     Family History  Problem Relation Age of Onset   Aneurysm Mother        aortic   Hypertension Mother    Headache Mother    Prostate cancer Father    Hypertension Father    Heart failure  Brother    Colon cancer Neg Hx    Colon polyps Neg Hx      Social History   Socioeconomic History   Marital status: Single    Spouse name: Not on file   Number of children: 2   Years of education: 61  Highest education level: 12th grade  Occupational History   Occupation: Custodian     Employer: [employer]  Tobacco Use   Smoking status: Former    Current packs/day: 0.00    Average packs/day: 1 pack/day for 20.0 years (20.0 ttl pk-yrs)    Types: Cigarettes    Start date: 2000    Quit date: 2020    Years since quitting: 4.7    Passive exposure: Past   Smokeless tobacco: Never   Tobacco comments:    Quit smoking x 2 years    07/2015   SOMETIIMES i USE VAPOR     Smoking Cessation Classes, Agencies, Aeronautical engineer Use   Vaping status: Never Used  Substance and Sexual Activity   Alcohol use: Never   Drug use: Never   Sexual activity: Not Currently    Partners: Female  Other Topics Concern   Not on file  Social History Narrative   Lives with friend   Divorced   No caffeine   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   Social Determinants of Corporate investment banker Strain: Low Risk  (03/30/2023)   Overall Financial Resource Strain (CARDIA)    Difficulty of Paying Living Expenses: Not very hard  Recent Concern: Financial Resource Strain - Medium Risk (01/08/2023)   Overall Financial Resource Strain (CARDIA)    Difficulty of Paying Living Expenses: Somewhat hard  Food Insecurity: No Food Insecurity (03/30/2023)   Hunger Vital Sign    Worried About Running Out of Food in the Last Year: Never true    Ran Out of Food in the Last Year: Never true  Transportation Needs: No Transportation Needs (03/30/2023)   PRAPARE - Administrator, Civil Service (Medical): No    Lack of Transportation (Non-Medical): No  Physical Activity: Sufficiently Active (03/30/2023)   Exercise Vital Sign    Days of Exercise per Week: 6 days     Minutes of Exercise per Session: 60 min  Recent Concern: Physical Activity - Inactive (03/12/2023)   Exercise Vital Sign    Days of Exercise per Week: 0 days    Minutes of Exercise per Session: 0 min  Stress: No Stress Concern Present (03/30/2023)   [employer] of Occupational Health - Occupational Stress Questionnaire    Feeling of Stress : Only a little  Recent Concern: Stress - Stress Concern Present (01/08/2023)   [employer] of Occupational Health - Occupational Stress Questionnaire    Feeling of Stress : Rather much  Social Connections: Moderately Integrated (03/30/2023)   Social Connection and Isolation Panel [NHANES]    Frequency of Communication with Friends and Family: More than three times a week    Frequency of Social Gatherings with Friends and Family: More than three times a week    Attends Religious Services: 1 to 4 times per year    Active Member of [employer] or Organizations: No    Attends Banker Meetings: Never    Marital Status: Living with partner  Intimate Partner Violence: Not At Risk (03/30/2023)   Humiliation, Afraid, Rape, and Kick questionnaire    Fear of Current or Ex-Partner: No    Emotionally Abused: No    Physically Abused: No    Sexually Abused: No     BP 138/62   Pulse (!) 58   Ht 5\' 7"  (1.702 m)   Wt 199 lb (90.3 kg)   SpO2 99%   BMI 31.17 kg/m  Physical Exam:  Well appearing NAD HEENT: Unremarkable Neck:  No JVD, no thyromegally Lymphatics:  No adenopathy Back:  No CVA tenderness Lungs:  Clear HEART:  Regular rate rhythm, no murmurs, no rubs, no clicks Abd:  soft, positive bowel sounds, no organomegally, no rebound, no guarding Ext:  2 plus pulses, no edema, no cyanosis, no clubbing Skin:  No rashes no nodules Neuro:  CN II through XII intact, motor grossly intact  EKG - none  DEVICE  Normal device function.  See PaceArt for details.   Assess/Plan:  SVT - he is s/p ablation. We have not seen any documented  recurrent SVT on his cardiac monitor. He will undergo watchful waiting.  HTN - his bp is well controlled today. We will follow.   Sharlot Gowda Abubakar Crispo,MD

## 2023-05-03 NOTE — Patient Instructions (Signed)
Medication Instructions:  Your physician recommends that you continue on your current medications as directed. Please refer to the Current Medication list given to you today.  *If you need a refill on your cardiac medications before your next appointment, please call your pharmacy*   Lab Work: None If you have labs (blood work) drawn today and your tests are completely normal, you will receive your results only by: MyChart Message (if you have MyChart) OR A paper copy in the mail If you have any lab test that is abnormal or we need to change your treatment, we will call you to review the results.   Testing/Procedures: None   Follow-Up: At [facility], you and your health needs are our priority.  As part of our continuing mission to provide you with exceptional heart care, we have created designated Provider Care Teams.  These Care Teams include your primary Cardiologist (physician) and Advanced Practice Providers (APPs -  Physician Assistants and Nurse Practitioners) who all work together to provide you with the care you need, when you need it.  We recommend signing up for the patient portal called "MyChart".  Sign up information is provided on this After Visit Summary.  MyChart is used to connect with patients for Virtual Visits (Telemedicine).  Patients are able to view lab/test results, encounter notes, upcoming appointments, etc.  Non-urgent messages can be sent to your provider as well.   To learn more about what you can do with MyChart, go to [email or web address].    Your next appointment:   1 year(s)  Provider:   Gregg Taylor, MD    Other Instructions    

## 2023-05-04 ENCOUNTER — Other Ambulatory Visit: Payer: Self-pay | Admitting: Family Medicine

## 2023-05-04 MED ORDER — PREGABALIN 50 MG PO CAPS: 50.0000 mg | ORAL_CAPSULE | Freq: Two times a day (BID) | ORAL | 2 refills | Status: DC

## 2023-05-04 NOTE — Telephone Encounter (Signed)
  Hi Heather,  Please inform the patient that I sent a referral to pain management, which he attended. I would like clarification as to why his pain needs were not addressed during his visit, as that was the primary reason for the referral. The patient has called requesting another referral to pain management, and I have placed the referral as requested.  Additionally, I increased his Lyrica dosage to 50 mg twice daily to help manage his neuropathic pain or fibromyalgia. Please note that we cannot increase the dosage too quickly; the next increase can be made in about 4 weeks. At this time, the only option is to gradually increase the Lyrica dosage every 4 weeks, or the patient will need to work closely with a pain specialist for further management. We need to avoid going back and forth between these approaches.  Thank you

## 2023-05-07 NOTE — Telephone Encounter (Signed)
Spoke with patient. He might have an appointment with [facility], he is finding out and will let us know.

## 2023-05-08 ENCOUNTER — Ambulatory Visit: Payer: Self-pay | Admitting: *Deleted

## 2023-05-08 DIAGNOSIS — G43909 Migraine, unspecified, not intractable, without status migrainosus: Secondary | ICD-10-CM | POA: Insufficient documentation

## 2023-05-08 DIAGNOSIS — F339 Major depressive disorder, recurrent, unspecified: Secondary | ICD-10-CM

## 2023-05-08 DIAGNOSIS — G43719 Chronic migraine without aura, intractable, without status migrainosus: Secondary | ICD-10-CM

## 2023-05-08 DIAGNOSIS — C61 Malignant neoplasm of prostate: Secondary | ICD-10-CM

## 2023-05-08 DIAGNOSIS — Z8546 Personal history of malignant neoplasm of prostate: Secondary | ICD-10-CM

## 2023-05-08 DIAGNOSIS — I482 Chronic atrial fibrillation, unspecified: Secondary | ICD-10-CM

## 2023-05-08 DIAGNOSIS — I4892 Unspecified atrial flutter: Secondary | ICD-10-CM

## 2023-05-08 DIAGNOSIS — G4730 Sleep apnea, unspecified: Secondary | ICD-10-CM

## 2023-05-08 DIAGNOSIS — I10 Essential (primary) hypertension: Secondary | ICD-10-CM

## 2023-05-08 NOTE — Patient Outreach (Addendum)
Care Coordination   Follow Up Visit Note   05/08/2023 Name: Steven [name] MRN: [id number] DOB: 15-Oct-1950  Steven [name] is a 72 y.o. year old male who sees Del Newman Nip, [place], FNP for primary care. I spoke with  Steven [name] by phone today.  What matters to the patients health and wellness today?  "Trying to make it"- reports he has been out of home visiting friends today   Flu [facility] at [facility] and is pending covid vaccine  Atrial fibrillation- still get "pounding and fluttering" of his heart, light headed   Tinnitus especially when he turns his head to the right  Did not go to his recommended vestibular therapy   Sometimes he does not become motivated to exercise but when he does he walks.   Hypertension 170/90 this morning   Left a message for the referral staff at [facility] clinic [phone] requested a return call to RN CM or patient for possible assist with CT scan scheduling outpatient vs in patient as patient voices his financial strain concern    Goals Addressed             This Visit's Progress    Manage Blood Pressure & Heart Rate/Rhythm, pain, financial strain --nurse care coordination   Not on track    Care Coordination Goals: Patient will continue to monitor and record blood pressure and heart rate daily and will call PCP with readings outside of recommended range Patient will reach out to RN Care Coordinator [phone] with any care coordination or resource needs   Interventions Today    Flowsheet Row Most Recent Value  Chronic Disease   Chronic disease during today's visit Atrial Fibrillation (AFib), Other, Hypertension (HTN)  [atrial flutter, CT scan,]  General Interventions   General Interventions Discussed/Reviewed General Interventions Reviewed, [facility], Doctor Visits, Communication with  [to see Dr Durwin Nora on 06/10/23. collaboration with [facility] radiology scheduler- clarifying 05/10/23 CT will be outpatient, unfamiliar  with another outpatient CT provider. Discussed patient seeking SHIIP assist with better 2025 coverage.]  Doctor Visits Discussed/Reviewed Doctor Visits Reviewed, PCP, Specialist  [to see Dr Durwin Nora in 05/2023]  PCP/Specialist Visits Compliance with follow-up visit  Communication with --  [left message for referral staff at [facility] clinic (jennifer I Mariah Milling) to inquire about an outpatient vs inpatient CT scan for patient financial strain concern. Care guide referral for financial strain. collaborated with Hilda Lias [facility] radiology scheduler.]  Exercise Interventions   Exercise Discussed/Reviewed Exercise Reviewed, Physical Activity  [confirms he walks and continue taught Home health PT exercises]  Physical Activity Discussed/Reviewed Home Exercise Program (HEP), Physical Activity Reviewed, Types of exercise  [discussed chair exercises]  Education Interventions   Education Provided Provided Web-based Education, Provided Education  [chair exercises, socialization , CT scan outpatient, cone financial application, vestibular therapy]  Provided Verbal Education On Mental Health/Coping with Illness, Exercise, Medication, Community Resources  Mental Health Interventions   Mental Health Discussed/Reviewed Mental Health Reviewed, Coping Strategies  Nutrition Interventions   Nutrition Discussed/Reviewed Nutrition Discussed, Fluid intake  Pharmacy Interventions   Pharmacy Dicussed/Reviewed Pharmacy Topics Reviewed, Medications and their functions, Affording Medications           COMPLETED: Manage Pain Effectively-care coordination services       Duplicate goal        SDOH assessments and interventions completed:  Yes  SDOH Interventions Today    Flowsheet Row Most Recent Value  SDOH Interventions   Financial Strain Interventions [id number] Referral,  Artist, Other (Comment)  [checking to see if there a resource from Child psychotherapist and a possible counselor]        Care Coordination  Interventions:  Yes, provided   Follow up plan: Follow up call scheduled for 05/09/23    Encounter Outcome:  Patient Visit Completed   Cala Bradford L. Noelle Penner, RN, BSN, [facility]  VBCI Care Management Coordinator  [phone]  Fax: [phone]

## 2023-05-08 NOTE — Patient Instructions (Addendum)
Visit Information  Thank you for taking time to visit with me today. Please don't hesitate to contact me if I can be of assistance to you.   Following are the goals we discussed today:   Goals Addressed             This Visit's Progress    Manage Blood Pressure & Heart Rate/Rhythm, pain, financial strain --nurse care coordination   Not on track    Care Coordination Goals: Patient will continue to monitor and record blood pressure and heart rate daily and will call PCP with readings outside of recommended range Patient will reach out to RN Care Coordinator [phone] with any care coordination or resource needs   Interventions Today    Flowsheet Row Most Recent Value  Chronic Disease   Chronic disease during today's visit Atrial Fibrillation (AFib), Other, Hypertension (HTN)  [atrial flutter, CT scan,]  General Interventions   General Interventions Discussed/Reviewed General Interventions Reviewed, [facility], Doctor Visits, Communication with  [to see Dr Durwin Nora on 06/18/23. collaboration with [facility] radiology scheduler- clarifying 05/10/23 CT will be outpatient, unfamiliar with another outpatient CT provider. Discussed patient seeking SHIIP assist with better 2025 coverage.]  Doctor Visits Discussed/Reviewed Doctor Visits Reviewed, PCP, Specialist  [to see Dr Durwin Nora in 05/2023]  PCP/Specialist Visits Compliance with follow-up visit  Communication with --  [left message for referral staff at [facility] clinic (jennifer I Mariah Milling) to inquire about an outpatient vs inpatient CT scan for patient financial strain concern. Care guide referral for financial strain. collaborated with Hilda Lias [facility] radiology scheduler.]  Exercise Interventions   Exercise Discussed/Reviewed Exercise Reviewed, Physical Activity  [confirms he walks and continue taught Home health PT exercises]  Physical Activity Discussed/Reviewed Home Exercise Program (HEP), Physical Activity Reviewed, Types of exercise  [discussed  chair exercises]  Education Interventions   Education Provided Provided Web-based Education, Provided Education  [chair exercises, socialization , CT scan outpatient, cone financial application, vestibular therapy]  Provided Verbal Education On Mental Health/Coping with Illness, Exercise, Medication, Community Resources  Mental Health Interventions   Mental Health Discussed/Reviewed Mental Health Reviewed, Coping Strategies  Nutrition Interventions   Nutrition Discussed/Reviewed Nutrition Discussed, Fluid intake  Pharmacy Interventions   Pharmacy Dicussed/Reviewed Pharmacy Topics Reviewed, Medications and their functions, Affording Medications           COMPLETED: Manage Pain Effectively-care coordination services       Duplicate goal        Our next appointment is by telephone on 05/09/23 at 0930  Please call the care guide team at [phone] if you need to cancel or reschedule your appointment.   If you are experiencing a Mental Health or Behavioral Health Crisis or need someone to talk to, please call the Suicide and Crisis Lifeline: 988 call the [place] National Suicide Prevention Lifeline: (918) 232-6688 or TTY: 8045579203 TTY 818 527 3141) to talk to a trained counselor call 1-800-273-TALK (toll free, 24 hour hotline) call the [facility]: 5481080717 call 911   Patient verbalizes understanding of instructions and care plan provided today and agrees to view in MyChart. Active MyChart status and patient understanding of how to access instructions and care plan via MyChart confirmed with patient.     The patient has been provided with contact information for the care management team and has been advised to call with any health related questions or concerns.    Jalani Cullifer L. Noelle Penner, RN, BSN, [facility]  VBCI Care Management Coordinator  [phone]  Fax: [phone]

## 2023-05-09 ENCOUNTER — Telehealth: Payer: Self-pay | Admitting: *Deleted

## 2023-05-09 ENCOUNTER — Ambulatory Visit: Payer: Self-pay | Admitting: *Deleted

## 2023-05-09 DIAGNOSIS — I160 Hypertensive urgency: Secondary | ICD-10-CM

## 2023-05-09 DIAGNOSIS — I4892 Unspecified atrial flutter: Secondary | ICD-10-CM

## 2023-05-09 DIAGNOSIS — C61 Malignant neoplasm of prostate: Secondary | ICD-10-CM

## 2023-05-09 DIAGNOSIS — F339 Major depressive disorder, recurrent, unspecified: Secondary | ICD-10-CM

## 2023-05-09 DIAGNOSIS — M797 Fibromyalgia: Secondary | ICD-10-CM

## 2023-05-09 NOTE — Progress Notes (Unsigned)
Care Coordination Note  05/09/2023 Name: [name] MRN: [id number] DOB: [birth year]  Steven [name] is a 72 y.o. year old male who is a primary care patient of Del Nigel Berthold, FNP and is actively engaged with the care management team. I reached out to [name] by phone today to assist with scheduling an initial visit with the BSW  Follow up plan: Unsuccessful telephone outreach attempt made. A HIPAA compliant phone message was left for the patient providing contact information and requesting a return call.   [facility]  Care Coordination Care Guide  Direct Dial: [phone]

## 2023-05-09 NOTE — Patient Outreach (Signed)
Care Coordination   Follow Up Visit Note   05/09/2023 Name: Steven [name] MRN: [id number] DOB: Jul 28, 1950  Steven [name] is a 72 y.o. year old male who sees Del Newman Nip, [place], FNP for primary care. I spoke with  Steven [name] by phone today.  What matters to the patients health and wellness today?  Upcoming CT is outpatient, 2025 coverage  Insurance coverage/financial strain Confirmed he did meet with his male insurance staff on 05/08/23 to review his coverage for 2025. Attempts made to prevent him from having a high deductible upfront. He will also speak with a male friend who has united healthcare coverage and seems to be receiving better coverage. He will then ask his insurance agent to review this plan to see if it is an option for him. He confirms changing plan on 05/08/23 with lower deductibles   Applied for medicaid again a few weeks ago who referred him back SSI - as the SSI office provides extra help for his medications He is working with a [employer] He will try to call his DSS staff to get further help today [phone] Does not pay for his medicines as medicaid covers them  He only gets his supplemental security income (SSI) and small pension He reports his worker's compensation is no longer active He Administrator, sports of RN CM update after speaking with CT scheduler Steven [name] on 05/08/23  CT scan He voices understanding that the CT can on 05/10/23 at [facility] is outpatient NOT inpatient per CT scheduler    He inquired about Legal representation resources     Goals Addressed             This Visit's Progress    Manage Blood Pressure & Heart Rate/Rhythm, pain, financial strain --nurse care coordination   Not on track    Care Coordination Goals: Patient will continue to monitor and record blood pressure and heart rate daily and will call PCP with readings outside of recommended range Patient will reach out to RN Care Coordinator [phone]  [phone] with any care coordination or resource needs   Interventions Today    Flowsheet Row Most Recent Value  Chronic Disease   Chronic disease during today's visit Other, Hypertension (HTN)  [updated patient that his CT will be outpatient. reviewed the outreach results with Steven [name] Ct scheduler on 05/08/23.  Discussed the [facility] PPO coverage concerns voiced by the scheduler. Education provided]  General Interventions   General Interventions Discussed/Reviewed General Interventions Reviewed, Doctor Visits, [facility]  Doctor Visits Discussed/Reviewed Specialist, Doctor Visits Reviewed  PCP/Specialist Visits Compliance with follow-up visit  Education Interventions   Education Provided Provided Education  Provided Verbal Education On [employer], Other, [facility]  [encouraged re applying for [facility] [place], providing all bills to his DSS staff, Judeth Cornfield, Discussed medicare advantage plans Medicare part A, B, C, D, SSI extra medication support services, financial counselor]  Mental Health Interventions   Mental Health Discussed/Reviewed Mental Health Reviewed, Coping Strategies  [remains active with are coordination social worker]              SDOH assessments and interventions completed:  No     Care Coordination Interventions:  Yes, provided   Follow up plan: Follow up call scheduled for 05/23/23    Encounter Outcome:  Patient Visit Completed   Cala Bradford L. Noelle Penner, RN, BSN, [facility]  VBCI Care Management Coordinator  [phone]  Fax: [phone]

## 2023-05-09 NOTE — Patient Instructions (Addendum)
Visit Information  Thank you for taking time to visit with me today. Please don't hesitate to contact me if I can be of assistance to you.   Following are the goals we discussed today:   Goals Addressed             This Visit's Progress    Manage Blood Pressure & Heart Rate/Rhythm, pain, financial strain --nurse care coordination   Not on track    Care Coordination Goals: Patient will continue to monitor and record blood pressure and heart rate daily and will call PCP with readings outside of recommended range Patient will reach out to RN Care Coordinator [phone] with any care coordination or resource needs   Interventions Today    Flowsheet Row Most Recent Value  Chronic Disease   Chronic disease during today's visit Other, Hypertension (HTN)  [updated patient that his CT will be outpatient. reviewed the outreach results with Jeani Hawking Ct scheduler on 05/08/23.  Discussed the [facility] PPO coverage concerns voiced by the scheduler. Education provided]  General Interventions   General Interventions Discussed/Reviewed General Interventions Reviewed, Doctor Visits, [facility]  Doctor Visits Discussed/Reviewed Specialist, Doctor Visits Reviewed  PCP/Specialist Visits Compliance with follow-up visit  Education Interventions   Education Provided Provided Education  Provided Verbal Education On [employer], Other, [facility]  [encouraged re applying for [facility] [place], providing all bills to his DSS staff, Judeth Cornfield, Discussed medicare advantage plans Medicare part A, B, C, D, SSI extra medication support services, financial counselor]  Mental Health Interventions   Mental Health Discussed/Reviewed Mental Health Reviewed, Coping Strategies  [remains active with are coordination social worker]              Our next appointment is by telephone on 05/23/23 at 3:15 pm  Please call the care guide team at [phone] if you need to cancel or reschedule your  appointment.   If you are experiencing a Mental Health or Behavioral Health Crisis or need someone to talk to, please call the Suicide and Crisis Lifeline: 988 call the [place] National Suicide Prevention Lifeline: 438-046-9085 or TTY: 579-113-4956 TTY 865-520-3626) to talk to a trained counselor call 1-800-273-TALK (toll free, 24 hour hotline) call the [facility]: 724-726-2803 call 911   Patient verbalizes understanding of instructions and care plan provided today and agrees to view in MyChart. Active MyChart status and patient understanding of how to access instructions and care plan via MyChart confirmed with patient.     The patient has been provided with contact information for the care management team and has been advised to call with any health related questions or concerns.   Desi Rowe L. Noelle Penner, RN, BSN, [facility]  VBCI Care Management Coordinator  [phone]  Fax: [phone]

## 2023-05-09 NOTE — Patient Outreach (Signed)
Care Coordination   Telephone message  Visit Note   05/09/2023 Name: STEEL VAID MRN: [id number] DOB: 01/20/1951  Steven [name] is a 72 y.o. year old male who sees Del Newman Nip, [place], FNP for primary care. I  received a voice message from Marisue Ivan at Dr Eppie Gibson office in [place] [facility]  What matters to the patients health and wellness today?  A referral was sent to Dr Patrcia Dolly for pain management [facility] is not pain management clinic per [facility] to pcp      Goals Addressed             This Visit's Progress    Manage Blood Pressure & Heart Rate/Rhythm, pain, financial strain --nurse care coordination   Not on track    Care Coordination Goals: Patient will continue to monitor and record blood pressure and heart rate daily and will call PCP with readings outside of recommended range Patient will reach out to RN Care Coordinator [phone] with any care coordination or resource needs   Interventions Today    Flowsheet Row Most Recent Value  Chronic Disease   Chronic disease during today's visit Other, Hypertension (HTN)  [updated patient that his CT will be outpatient. reviewed the outreach results with Jeani Hawking Ct scheduler on 05/08/23.  Discussed the [facility] PPO coverage concerns voiced by the scheduler. Education provided]  General Interventions   General Interventions Discussed/Reviewed General Interventions Reviewed, Doctor Visits, [facility]  Doctor Visits Discussed/Reviewed Specialist, Doctor Visits Reviewed  PCP/Specialist Visits Compliance with follow-up visit  Education Interventions   Education Provided Provided Education  Provided Verbal Education On [employer], Other, [facility]  [encouraged re applying for [facility] [place], providing all bills to his DSS staff, Judeth Cornfield, Discussed medicare advantage plans Medicare part A, B, C, D, SSI extra medication support services, financial counselor]  Mental Health Interventions    Mental Health Discussed/Reviewed Mental Health Reviewed, Coping Strategies  [remains active with are coordination social worker]              SDOH assessments and interventions completed:  No     Care Coordination Interventions:  Yes, provided   Follow up plan: Follow up call scheduled for 05/23/23    Encounter Outcome:  Patient Visit Completed   Cala Bradford L. Noelle Penner, RN, BSN, [facility]  VBCI Care Management Coordinator  [phone]  Fax: [phone]

## 2023-05-10 ENCOUNTER — Ambulatory Visit (HOSPITAL_COMMUNITY)
Admission: RE | Admit: 2023-05-10 | Discharge: 2023-05-10 | Disposition: A | Discharge status: Home / Self Care | Payer: Medicare PPO | Source: Ambulatory Visit | Attending: Psychiatry | Admitting: Psychiatry

## 2023-05-10 DIAGNOSIS — I6521 Occlusion and stenosis of right carotid artery: Secondary | ICD-10-CM | POA: Diagnosis not present

## 2023-05-10 DIAGNOSIS — I700 Atherosclerosis of aorta: Secondary | ICD-10-CM | POA: Diagnosis not present

## 2023-05-10 DIAGNOSIS — R55 Syncope and collapse: Secondary | ICD-10-CM | POA: Insufficient documentation

## 2023-05-10 MED ORDER — IOHEXOL 350 MG/ML SOLN
75.0000 mL | Freq: Once | INTRAVENOUS | Status: AC | PRN
  Administered 2023-05-10: 75 mL via INTRAVENOUS

## 2023-05-10 NOTE — Progress Notes (Signed)
Care Coordination Note  05/10/2023 Name: COUREY MOTA MRN: [id number] DOB: [birth year]  CAVIN TRIPPE is a 72 y.o. year old male who is a primary care patient of Del Nigel Berthold, FNP and is actively engaged with the care management team. I reached out to [name] by phone today to assist with scheduling an initial visit with the BSW  Follow up plan: Telephone appointment with care management team member scheduled for:05/16/23  [facility] Coordination Care Guide  Direct Dial: [phone]

## 2023-05-11 ENCOUNTER — Other Ambulatory Visit: Payer: Self-pay | Admitting: Family Medicine

## 2023-05-11 ENCOUNTER — Ambulatory Visit (INDEPENDENT_AMBULATORY_CARE_PROVIDER_SITE_OTHER): Payer: Medicare PPO | Admitting: Internal Medicine

## 2023-05-11 ENCOUNTER — Encounter: Payer: Self-pay | Admitting: Internal Medicine

## 2023-05-11 VITALS — BP 148/76 | HR 55 | Ht 67.0 in | Wt 199.4 lb

## 2023-05-11 DIAGNOSIS — M25552 Pain in left hip: Secondary | ICD-10-CM | POA: Diagnosis not present

## 2023-05-11 DIAGNOSIS — I10 Essential (primary) hypertension: Secondary | ICD-10-CM

## 2023-05-11 MED ORDER — KETOROLAC TROMETHAMINE 60 MG/2ML IM SOLN
60.0000 mg | Freq: Once | INTRAMUSCULAR | Status: AC
Start: 2023-05-11 — End: 2023-05-11
  Administered 2023-05-11: 60 mg via INTRAMUSCULAR

## 2023-05-11 MED ORDER — PREDNISONE 10 MG (21) PO TBPK
ORAL_TABLET | ORAL | 0 refills | Status: DC
Start: 2023-05-11 — End: 2023-06-01

## 2023-05-11 MED ORDER — TIZANIDINE HCL 4 MG PO TABS
4.0000 mg | ORAL_TABLET | Freq: Four times a day (QID) | ORAL | 0 refills | Status: DC | PRN
Start: 2023-05-11 — End: 2024-01-01

## 2023-05-11 NOTE — Assessment & Plan Note (Signed)
Likely has OA of hip and DDD of lumbar spine Check x-ray of hip and lumbar spine Sterapred taper prescribed Tizanidine as needed for muscle spasms Continue Lyrica Avoid heavy lifting and frequent bending Heating pad and/or back brace as needed

## 2023-05-11 NOTE — Progress Notes (Signed)
Acute Office Visit  Subjective:    Patient ID: Steven [name], male    DOB: 01-29-1951, 72 y.o.   MRN: [id number]  Chief Complaint  Patient presents with   Hip Pain    Left hip pain, worse with walking    HPI Patient is in today for evaluation of acute on chronic left hip pain, worse since 05/04/23.  He initially reported lower abdominal pain, but points towards left hip area.  His pain is worse upon prolonged standing, walking and movement.  He had severe pain while lying down on the exam table.  He denies any injury or fall.  Reports moving the lawn mower with his daughter about a week ago.  He also reports going out for a walk before his pain started.  He has history of chronic pain syndrome from DDD of cervical spine.  He is currently taking Lyrica 50 mg twice daily.  Denies any dysuria, hematuria, urinary or stool incontinence.  Past Medical History:  Diagnosis Date   Anxiety    Arthritis    Atrial fibrillation (HCC)    Atrial flutter (HCC)    BPH (benign prostatic hyperplasia)    Chest pain    Dizziness    Dyspnea    laying down occ   Fracture 08/17/2015   MULTIPLE RIB FRACTURES     FROM FALL    GERD (gastroesophageal reflux disease)    Hemorrhoids    History of kidney stones    noted on CT scan   History of radiation therapy 08/22/11-10/13/11   prostate   Hyperlipemia    Hypertension    Hyperthyroidism    IBS (irritable bowel syndrome)    Insomnia    Light headedness    Migraine    Numbness and tingling in left arm    Numbness and tingling of both legs    Open fracture of left elbow 08/18/2015   Prostate cancer [facility])    prostate s/p radiation Mar 2013   Rib fractures 08/17/2015   Tinnitus     Past Surgical History:  Procedure Laterality Date   BOTOX INJECTION N/A 01/14/2020   Procedure: INJECTION OF BOTOX INTO ANAL SPHINCTER;  Surgeon: Andria Meuse, MD;  Location: [facility] ORS;  Service: General;  Laterality: N/A;   COLONOSCOPY N/A 12/19/2012   [id number]  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 Normal appearing colon   COLONOSCOPY WITH PROPOFOL N/A 10/23/2019   Procedure: COLONOSCOPY WITH PROPOFOL;  Surgeon: Corbin Ade, MD; external and grade 2 internal hemorrhoids, abnormal rectal blood vessels consistent with radiation proctitis s/p APC therapy, otherwise normal exam.   EVALUATION UNDER ANESTHESIA WITH ANAL FISTULECTOMY N/A 01/14/2020   Procedure: ANORECTAL EXAM UNDER ANESTHESIA;  Surgeon: Andria Meuse, MD;  Location: [facility] ORS;  Service: General;  Laterality: N/A;   HOT HEMOSTASIS  10/23/2019   Procedure: HOT HEMOSTASIS (ARGON PLASMA COAGULATION/BICAP);  Surgeon: Corbin Ade, MD;  Location: [facility] ENDO SUITE;  Service: Endoscopy;;  apc rectal proctitis     KIDNEY SURGERY  1982   kidney tube collapse repair   RADIOACTIVE SEED IMPLANT     Prostate   SVT ABLATION N/A 12/12/2022   Procedure: SVT ABLATION;  Surgeon: Marinus Maw, MD;  Location: [facility] INVASIVE CV LAB;  Service: Cardiovascular;  Laterality: N/A;    Family History  Problem Relation Age of Onset   Aneurysm Mother        aortic   Hypertension Mother  Headache Mother    Prostate cancer Father    Hypertension Father    Heart failure Brother    Colon cancer Neg Hx    Colon polyps Neg Hx     Social History   Socioeconomic History   Marital status: Single    Spouse name: Not on file   Number of children: 2   Years of education: 12   Highest education level: 12th grade  Occupational History   Occupation: Custodian     Employer: [employer]  Tobacco Use   Smoking status: Former    Current packs/day: 0.00    Average packs/day: 1 pack/day for 20.0 years (20.0 ttl pk-yrs)    Types: Cigarettes    Start date: 2000    Quit date: 2020    Years since quitting: 4.8    Passive exposure: Past   Smokeless tobacco: Never   Tobacco comments:    Quit smoking x 2 years    07/2015   SOMETIIMES i USE  VAPOR     Smoking Cessation Classes, Agencies, Aeronautical engineer Use   Vaping status: Never Used  Substance and Sexual Activity   Alcohol use: Never   Drug use: Never   Sexual activity: Not Currently    Partners: Female  Other Topics Concern   Not on file  Social History Narrative   Lives with friend   Divorced   No caffeine   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   Social Determinants of Corporate investment banker Strain: High Risk (05/08/2023)   Overall Financial Resource Strain (CARDIA)    Difficulty of Paying Living Expenses: Hard  Food Insecurity: No Food Insecurity (03/30/2023)   Hunger Vital Sign    Worried About Running Out of Food in the Last Year: Never true    Ran Out of Food in the Last Year: Never true  Transportation Needs: No Transportation Needs (03/30/2023)   PRAPARE - Administrator, Civil Service (Medical): No    Lack of Transportation (Non-Medical): No  Physical Activity: Sufficiently Active (03/30/2023)   Exercise Vital Sign    Days of Exercise per Week: 6 days    Minutes of Exercise per Session: 60 min  Recent Concern: Physical Activity - Inactive (03/12/2023)   Exercise Vital Sign    Days of Exercise per Week: 0 days    Minutes of Exercise per Session: 0 min  Stress: No Stress Concern Present (03/30/2023)   [employer] of Occupational Health - Occupational Stress Questionnaire    Feeling of Stress : Only a little  Recent Concern: Stress - Stress Concern Present (01/08/2023)   [employer] of Occupational Health - Occupational Stress Questionnaire    Feeling of Stress : Rather much  Social Connections: Moderately Integrated (03/30/2023)   Social Connection and Isolation Panel [NHANES]    Frequency of Communication with Friends and Family: More than three times a week    Frequency of Social Gatherings with Friends and Family: More than three times a week    Attends Religious Services: 1 to 4 times per  year    Active Member of [employer] or Organizations: No    Attends Banker Meetings: Never    Marital Status: Living with partner  Intimate Partner Violence: Not At Risk (03/30/2023)   Humiliation, Afraid, Rape, and Kick questionnaire    Fear of Current or Ex-Partner: No    Emotionally Abused: No    Physically Abused:  No    Sexually Abused: No    Outpatient Medications Prior to Visit  Medication Sig Dispense Refill   acetaminophen (TYLENOL) 500 MG tablet Take 500 mg by mouth every 6 (six) hours as needed for moderate pain.     alfuzosin (UROXATRAL) 10 MG 24 hr tablet Take 1 tablet (10 mg total) by mouth daily with breakfast. 90 tablet 3   amLODipine (NORVASC) 10 MG tablet Take 1 tablet (10 mg total) by mouth daily. 90 tablet 3   Azelastine-Fluticasone 137-50 MCG/ACT SUSP Place 1 spray into the nose every 12 (twelve) hours. 23 g 1   Cholecalciferol (VITAMIN D3) 125 MCG (5000 UT) CAPS Take 1 capsule by mouth daily.      cloNIDine (CATAPRES) 0.1 MG tablet Take 1 tablet (0.1 mg total) by mouth 2 (two) times daily as needed (SBP>160). (Patient taking differently: Take 0.1 mg by mouth 3 (three) times daily.) 60 tablet 11   ELIQUIS 5 MG TABS tablet TAKE 1 TABLET(5 MG) BY MOUTH TWICE DAILY 60 tablet 5   furosemide (LASIX) 20 MG tablet Take 1 tablet (20 mg total) by mouth daily. Take with or after meal. 90 tablet 3   Galcanezumab-gnlm (EMGALITY) 120 MG/ML SOAJ Inject 1 Pen into the skin every 30 (thirty) days. 1.12 mL 7   hydrOXYzine (ATARAX) 10 MG tablet Take 1 tablet (10 mg total) by mouth 3 (three) times daily as needed for anxiety. 30 tablet 0   hyoscyamine (LEVBID) 0.375 MG 12 hr tablet Take 0.375 mg by mouth 2 (two) times daily.     loratadine (CLARITIN) 10 MG tablet Take 1 tablet (10 mg total) by mouth daily. 30 tablet 11   losartan (COZAAR) 100 MG tablet Take 100 mg by mouth daily.     methimazole (TAPAZOLE) 5 MG tablet Take 0.5 tablets (2.5 mg total) by mouth daily. 45 tablet 1    metoprolol tartrate (LOPRESSOR) 25 MG tablet Take 1 tablet (25 mg total) by mouth 2 (two) times daily. TAKE EXTRA HALF TABLET  AS NEEDED 180 tablet 2   mirtazapine (REMERON) 7.5 MG tablet TAKE 1 TABLET(7.5 MG) BY MOUTH AT BEDTIME 30 tablet 1   NURTEC 75 MG TBDP Take 75 mg by mouth daily as needed (pain).     ondansetron (ZOFRAN) 4 MG tablet Take 1 tablet (4 mg total) by mouth every 8 (eight) hours as needed for nausea or vomiting. 20 tablet 1   polyethylene glycol (MIRALAX / GLYCOLAX) 17 g packet Take 17 g by mouth daily. 30 each 2   potassium chloride SA (KLOR-CON M) 20 MEQ tablet Take 1 tablet (20 mEq total) by mouth daily. 30 tablet 0   pregabalin (LYRICA) 50 MG capsule Take 1 capsule (50 mg total) by mouth 2 (two) times daily. 60 capsule 2   Probiotic Product (PROBIOTIC-10 PO) Take 1 capsule by mouth daily.     Ubrogepant (UBRELVY) 100 MG TABS Take 1 tablet (100 mg total) by mouth as needed (for headaches). May repeat a dose in 2 hours if needed. Max dose 2 pills in 24 hours 16 tablet 6   vitamin B-12 (CYANOCOBALAMIN) 100 MCG tablet Take 100 mcg by mouth daily.     No facility-administered medications prior to visit.    Allergies  Allergen Reactions   Latex Rash    Possible reaction to latex gloves per patient   Meclizine     Other reaction(s): Abdominal Pain, Other    Review of Systems  Constitutional:  Negative for chills and  fever.  HENT:  Negative for congestion and sore throat.   Eyes:  Negative for pain and discharge.  Respiratory:  Negative for cough and shortness of breath.   Cardiovascular:  Negative for chest pain and palpitations.  Gastrointestinal:  Negative for diarrhea, nausea and vomiting.  Endocrine: Negative for polydipsia and polyuria.  Genitourinary:  Negative for dysuria and hematuria.  Musculoskeletal:  Positive for arthralgias (L hip), back pain and neck pain. Negative for neck stiffness.  Skin:  Negative for rash.  Neurological:  Positive for headaches.  Negative for weakness and numbness.  Psychiatric/Behavioral:  Negative for agitation and behavioral problems.        Objective:    Physical Exam Vitals reviewed.  Constitutional:      General: He is not in acute distress.    Appearance: He is obese. He is not diaphoretic.  HENT:     Head: Normocephalic and atraumatic.     Nose: Nose normal.     Mouth/Throat:     Mouth: Mucous membranes are moist.  Eyes:     General: No scleral icterus.    Extraocular Movements: Extraocular movements intact.  Cardiovascular:     Rate and Rhythm: Normal rate and regular rhythm.     Heart sounds: Normal heart sounds. No murmur heard. Pulmonary:     Breath sounds: Normal breath sounds. No wheezing or rales.  Abdominal:     General: Bowel sounds are normal.     Palpations: Abdomen is soft.     Tenderness: There is no abdominal tenderness.  Musculoskeletal:     Cervical back: Neck supple. No tenderness.     Lumbar back: Tenderness present. Decreased range of motion.     Left hip: Tenderness present. Decreased range of motion.     Right lower leg: No edema.     Left lower leg: No edema.  Skin:    General: Skin is warm.     Findings: No rash.  Neurological:     General: No focal deficit present.     Mental Status: He is alert and oriented to person, place, and time.  Psychiatric:        Mood and Affect: Mood normal.        Behavior: Behavior normal.     BP (!) 148/76 (BP Location: Left Arm)   Pulse (!) 55   Ht 5\' 7"  (1.702 m)   Wt 199 lb 6.4 oz (90.4 kg)   SpO2 96%   BMI 31.23 kg/m  Wt Readings from Last 3 Encounters:  05/11/23 199 lb 6.4 oz (90.4 kg)  05/03/23 199 lb (90.3 kg)  04/27/23 195 lb (88.5 kg)        Assessment & Plan:   Problem List Items Addressed This Visit       Cardiovascular and Mediastinum   HTN (hypertension)    BP elevated today, could be due to pain Continue current regimen        Other   Pain of left hip - Primary    Likely has OA of hip and  DDD of lumbar spine Check x-ray of hip and lumbar spine Sterapred taper prescribed Tizanidine as needed for muscle spasms Continue Lyrica Avoid heavy lifting and frequent bending Heating pad and/or back brace as needed      Relevant Medications   predniSONE (STERAPRED UNI-PAK 21 TAB) 10 MG (21) TBPK tablet   tiZANidine (ZANAFLEX) 4 MG tablet   Other Relevant Orders   DG Pelvis 1-2 Views   DG  Lumbar Spine Complete     Meds ordered this encounter  Medications   predniSONE (STERAPRED UNI-PAK 21 TAB) 10 MG (21) TBPK tablet    Sig: Take as package instructions.    Dispense:  1 each    Refill:  0   tiZANidine (ZANAFLEX) 4 MG tablet    Sig: Take 1 tablet (4 mg total) by mouth every 6 (six) hours as needed for muscle spasms.    Dispense:  30 tablet    Refill:  0   ketorolac (TORADOL) injection 60 mg     Anabel Halon, MD

## 2023-05-11 NOTE — Assessment & Plan Note (Signed)
BP elevated today, could be due to pain Continue current regimen

## 2023-05-11 NOTE — Patient Instructions (Signed)
Please start taking Prednisone as prescribed.  Please take Tizanidine as needed only for muscle spasms.  Avoid any heavy lifting and frequent bending.

## 2023-05-16 ENCOUNTER — Ambulatory Visit: Payer: Self-pay | Admitting: *Deleted

## 2023-05-16 ENCOUNTER — Ambulatory Visit: Payer: Self-pay

## 2023-05-16 NOTE — Patient Outreach (Signed)
Care Coordination   Initial Visit Note   05/16/2023 Name: Steven [name] MRN: [id number] DOB: [birth year]  Steven [name] is a 72 y.o. year old male who sees Del Newman Nip, [place], FNP for primary care. I spoke with  Steven [name] by phone today.  What matters to the patients health and wellness today?  Patient wants resources for medical bills and support with Medicaid application.    Goals Addressed             This Visit's Progress    Care Coordination Activities       Interventions Today    Flowsheet Row Most Recent Value  Chronic Disease   Chronic disease during today's visit Hypertension (HTN)  General Interventions   General Interventions Discussed/Reviewed General Interventions Discussed, General Interventions Reviewed, Communication with  [Pt reports $2000 balance on medical bills sent to collections. SW recommends making a payment arrangement. Company asking for larger amount than he can afford with $1300 income. Pt has $100 [employer] extra assistance.]  Doctor Visits Discussed/Reviewed Doctor Visits Discussed  [Pt has heart pounding episode. Encourage to contact triage nurse or 911. Pt needs CTScan. Encourage to speak to nurse to follow up.]  Communication with --  [Pt called Medicaid Worker and Supervisor and left messages with no response. SW t/c Judeth Cornfield [phone] and left vm with patient providing consent. Pt has Medicaid MQB but wants coverage for medical bills.]              SDOH assessments and interventions completed:  Yes  SDOH Interventions Today    Flowsheet Row Most Recent Value  SDOH Interventions   Food Insecurity Interventions Intervention Not Indicated, Other (Comment)  [Use extra benefit from insurance for food]  Housing Interventions Intervention Not Indicated  Transportation Interventions Intervention Not Indicated, Other (Comment)  [Has a car]  Utilities Interventions Intervention Not Indicated        Care Coordination  Interventions:  Yes, provided   Follow up plan: Follow up call scheduled for 05/23/23 at 11am    Encounter Outcome:  Patient Visit Completed

## 2023-05-16 NOTE — Patient Instructions (Signed)
Visit Information  Thank you for taking time to visit with me today. Please don't hesitate to contact me if I can be of assistance to you.   Following are the goals we discussed today:  Patient to contact collection company for payment arrangements. Patient to speak to nurse regarding CT Scan scheduling. SW will follow up DSS Medicaid regarding application.   Our next appointment is by telephone on 05/23/23 at 11am.  Please call the care guide team at [phone] if you need to cancel or reschedule your appointment.   If you are experiencing a Mental Health or Behavioral Health Crisis or need someone to talk to, please call 911  Patient verbalizes understanding of instructions and care plan provided today and agrees to view in MyChart. Active MyChart status and patient understanding of how to access instructions and care plan via MyChart confirmed with patient.     Telephone follow up appointment with care management team member scheduled for: 05/23/23 at 11am.  Lysle Morales, BSW Social Worker [phone]

## 2023-05-18 ENCOUNTER — Telehealth: Payer: Self-pay | Admitting: Family Medicine

## 2023-05-18 NOTE — Telephone Encounter (Signed)
Prescription Request  05/18/2023  LOV: 04/19/2023  What is the name of the medication or equipment? Nexletol 180 mg  (was give to old pcp)  Have you contacted your pharmacy to request a refill? No   Which pharmacy would you like this sent to?   [facility] [address]    Patient notified that their request is being sent to the clinical staff for review and that they should receive a response within 2 business days.   Please advise at Mobile [phone] (mobile)

## 2023-05-22 ENCOUNTER — Ambulatory Visit: Payer: Self-pay | Admitting: *Deleted

## 2023-05-22 ENCOUNTER — Other Ambulatory Visit: Payer: Self-pay | Admitting: Family Medicine

## 2023-05-22 MED ORDER — ATORVASTATIN CALCIUM 40 MG PO TABS: 40.0000 mg | ORAL_TABLET | Freq: Every day | ORAL | 3 refills | Status: DC

## 2023-05-22 NOTE — Patient Outreach (Signed)
Care Coordination   Follow Up Visit Note   05/22/2023  Name: Steven [name] MRN: [id number] DOB: [birth year]  Steven [name] is a 72 y.o. year old male who sees Del Newman [name], [place], FNP for primary care. I spoke with Steven [name] by phone today.  What matters to the patients health and wellness today?  Receive Counseling & Supportive Services for Symptoms of Anxiety   Goals Addressed               This Visit's Progress     Receive Counseling & Supportive Services for Symptoms of Anxiety. (pt-stated)   On track     Care Coordination Interventions:  Interventions Today    Flowsheet Row Most Recent Value  Chronic Disease   Chronic disease during today's visit Hypertension (HTN), Atrial Fibrillation (AFib), Other  [Diffuse Pain, Recurrent Depression, History of Falls & Fall Risk, Dizzy Spells, Fibromyalgia,]  General Interventions   General Interventions Discussed/Reviewed General Interventions Discussed, Durable Medical Equipment (DME), General Interventions Reviewed, Vaccines, Health Screening, Annual Eye Exam, Labs, Lipid Profile, Annual Foot Exam, Doctor Visits, Programmer, applications, Communication with  [employer] with Care Team Members]  Labs Hgb A1c every 3 months  Doctor Visits Discussed/Reviewed Doctor Visits Discussed, Doctor Visits Reviewed, Annual Wellness Visits, PCP, Database administrator (DME) BP Cuff, Environmental consultant, Doctor, general practice  PCP/Specialist Visits Compliance with follow-up visit  Communication with PCP/Specialists, RN  Exercise Interventions   Exercise Discussed/Reviewed Exercise Discussed, Exercise Reviewed, Physical Activity, Weight Managment, Assistive device use and maintanence  Physical Activity Discussed/Reviewed Physical Activity Discussed, Physical Activity Reviewed, Types of exercise, Home Exercise Program (HEP), PREP, Gym  Weight Management Weight loss  Education Interventions   Education  Provided Provided Education  Provided Verbal Education On Nutrition, Exercise, Medication, When to see the doctor, Blood Sugar Monitoring, Labs, Mental Health/Coping with Illness, Community Resources, Foot Care, Eye Care  Labs Reviewed Hgb A1c  Mental Health Interventions   Mental Health Discussed/Reviewed Mental Health Discussed, Substance Abuse, Suicide, Mental Health Reviewed, Coping Strategies, Crisis, Anxiety, Depression, Grief and Loss  Nutrition Interventions   Nutrition Discussed/Reviewed Nutrition Discussed, Nutrition Reviewed, Carbohydrate meal planning, Adding fruits and vegetables, Fluid intake, Portion sizes, Decreasing sugar intake, Increasing proteins, Decreasing fats, Decreasing salt  Pharmacy Interventions   Pharmacy Dicussed/Reviewed Pharmacy Topics Discussed, Affording Medications, Pharmacy Topics Reviewed, Medications and their functions, Medication Adherence  Safety Interventions   Safety Discussed/Reviewed Safety Discussed, Safety Reviewed, Fall Risk  Home Safety Assistive Devices  Advanced Directive Interventions   Advanced Directives Discussed/Reviewed Advanced Directives Discussed      Active Listening & Reflection Utilized.  Verbalization of Feelings Encouraged.  Emotional Support Provided. Problem Solving Interventions Employed. Solution-Focused Strategies Implemented. Acceptance & Commitment Therapy Initiated. Cognitive Behavioral Therapy Performed. CSW Collaboration with Primary Care Provider, Steven [name], Family Nurse Practitioner with [facility] [phone]), Via Secure Chat Message in Epic, to Report the Following Concerns, Per Your Request:          1. Symptoms of Sinus Infection.  2. Over-the-Counter Medications Ineffective.  3. Drug Interaction with Current Medication Regimen, if Antibiotic or Azithromycin is Prescribed. 4. Varying Degrees of Pain Still Affecting All Regions of Body. 5. Blood Pressure Increases  While Lying Down & At Rest. CSW Collaboration with Primary Care Provider, Steven [name], Family Nurse Practitioner with [facility] [phone]), Via Secure Chat Message in Epic, to Report  the Following      Questions, Per Your Request:   1. Can Azithromycin Treat Sinus Infection?  2. Can Prescription Antibiotic or Azithromycin Be Called into Pharmacy?  3. Status of Order for CAT Scan, Per Request of Dr. Trena Platt, During Office Visit on 05/11/2023? 4. Does Head Need to Be Elevated Above Heart While Lying Down & At Rest?  5. Is Sleeping at An Incline Recommended for Better Management of High Blood Pressure?   Encouraged Contact with CSW (# [phone]), if You Have Questions, Need Assistance, or If Additional Social Work Needs Are Identified Between Now & Our Next Follow-Up Outreach Call, Scheduled on 06/04/2023 at 3:30 PM.      SDOH assessments and interventions completed:  Yes.  Care Coordination Interventions:  Yes, provided.   Follow up plan: Follow up call scheduled for 06/04/2023 at 3:30 pm.  Encounter Outcome:  Patient Visit Completed.   Steven [name], BSW, MSW, Printmaker Social Work Case Set designer Health  [facility], Population Health Direct Dial: [phone]  Fax: [phone] Email: [name]: [email or web address]

## 2023-05-22 NOTE — Patient Instructions (Signed)
Visit Information  Thank you for taking time to visit with me today. Please don't hesitate to contact me if I can be of assistance to you.   Following are the goals we discussed today:   Goals Addressed               This Visit's Progress     Receive Counseling & Supportive Services for Symptoms of Anxiety. (pt-stated)   On track     Care Coordination Interventions:  Interventions Today    Flowsheet Row Most Recent Value  Chronic Disease   Chronic disease during today's visit Hypertension (HTN), Atrial Fibrillation (AFib), Other  [Diffuse Pain, Recurrent Depression, History of Falls & Fall Risk, Dizzy Spells, Fibromyalgia,]  General Interventions   General Interventions Discussed/Reviewed General Interventions Discussed, Durable Medical Equipment (DME), General Interventions Reviewed, Vaccines, Health Screening, Annual Eye Exam, Labs, Lipid Profile, Annual Foot Exam, Doctor Visits, Programmer, applications, Communication with  [employer] with Care Team Members]  Labs Hgb A1c every 3 months  Doctor Visits Discussed/Reviewed Doctor Visits Discussed, Doctor Visits Reviewed, Annual Wellness Visits, PCP, Database administrator (DME) BP Cuff, Environmental consultant, Doctor, general practice  PCP/Specialist Visits Compliance with follow-up visit  Communication with PCP/Specialists, RN  Exercise Interventions   Exercise Discussed/Reviewed Exercise Discussed, Exercise Reviewed, Physical Activity, Weight Managment, Assistive device use and maintanence  Physical Activity Discussed/Reviewed Physical Activity Discussed, Physical Activity Reviewed, Types of exercise, Home Exercise Program (HEP), PREP, Gym  Weight Management Weight loss  Education Interventions   Education Provided Provided Education  Provided Verbal Education On Nutrition, Exercise, Medication, When to see the doctor, Blood Sugar Monitoring, Labs, Mental Health/Coping with Illness, Community  Resources, Foot Care, Eye Care  Labs Reviewed Hgb A1c  Mental Health Interventions   Mental Health Discussed/Reviewed Mental Health Discussed, Substance Abuse, Suicide, Mental Health Reviewed, Coping Strategies, Crisis, Anxiety, Depression, Grief and Loss  Nutrition Interventions   Nutrition Discussed/Reviewed Nutrition Discussed, Nutrition Reviewed, Carbohydrate meal planning, Adding fruits and vegetables, Fluid intake, Portion sizes, Decreasing sugar intake, Increasing proteins, Decreasing fats, Decreasing salt  Pharmacy Interventions   Pharmacy Dicussed/Reviewed Pharmacy Topics Discussed, Affording Medications, Pharmacy Topics Reviewed, Medications and their functions, Medication Adherence  Safety Interventions   Safety Discussed/Reviewed Safety Discussed, Safety Reviewed, Fall Risk  Home Safety Assistive Devices  Advanced Directive Interventions   Advanced Directives Discussed/Reviewed Advanced Directives Discussed      Active Listening & Reflection Utilized.  Verbalization of Feelings Encouraged.  Emotional Support Provided. Problem Solving Interventions Employed. Solution-Focused Strategies Implemented. Acceptance & Commitment Therapy Initiated. Cognitive Behavioral Therapy Performed. CSW Collaboration with Primary Care Provider, Cruzita Lederer Newman Nip, Family Nurse Practitioner with [facility] [phone]), Via Secure Chat Message in Epic, to Report the Following Concerns, Per Your Request:   1. Symptoms of Sinus Infection.  2. Over-the-Counter Medications Ineffective.  3. Drug Interaction with Current Medication Regimen, if Antibiotic or Azithromycin is Prescribed. 4. Varying Degrees of Pain Still Affecting All Regions of Body. 5. Blood Pressure Increases While Lying Down & At Rest. CSW Collaboration with Primary Care Provider, Cruzita Lederer Newman Nip, Family Nurse Practitioner with [facility] [phone]), Via  Secure Chat Message in Epic, to Report the Following      Questions, Per Your Request:   1. Can Azithromycin Treat Sinus Infection?  2. Can Prescription Antibiotic or Azithromycin Be Called into Pharmacy?  3. Status of Order for CAT Scan, Per Request of  Dr. Trena Platt, During Office Visit on 05/11/2023? 4. Does Head Need to Be Elevated Above Heart While Lying Down & At Rest?  5. Is Sleeping at An Incline Recommended for Better Management of High Blood Pressure?   Encouraged Contact with CSW (# [phone]), if You Have Questions, Need Assistance, or If Additional Social Work Needs Are Identified Between Now & Our Next Follow-Up Outreach Call, Scheduled on 06/04/2023 at 3:30 PM.      Our next appointment is by telephone on 06/04/2023 at 3:30 pm.  Please call the care guide team at [phone] if you need to cancel or reschedule your appointment.   If you are experiencing a Mental Health or Behavioral Health Crisis or need someone to talk to, please call the Suicide and Crisis Lifeline: 988 call the [place] National Suicide Prevention Lifeline: 386 732 4618 or TTY: 209-056-3170 TTY 913-331-2968) to talk to a trained counselor call 1-800-273-TALK (toll free, 24 hour hotline) go to [facility] [address], [place] [phone]) call the [facility] Crisis Line: [phone] call 911  Patient verbalizes understanding of instructions and care plan provided today and agrees to view in MyChart. Active MyChart status and patient understanding of how to access instructions and care plan via MyChart confirmed with patient.     Telephone follow up appointment with care management team member scheduled for:  06/04/2023 at 3:30 pm.  Danford Bad, BSW, MSW, LCSW  Embedded Practice Social Work Case Manager  [facility], Population Health Direct Dial: [phone]  Fax: [phone] Email:  [email or web address]: [email or web address]

## 2023-05-22 NOTE — Telephone Encounter (Signed)
Medication is not on his active medication list please advice says its for cholesterol.

## 2023-05-22 NOTE — Telephone Encounter (Signed)
Spoke to pt states he will take lipitor.

## 2023-05-22 NOTE — Telephone Encounter (Signed)
Please inform patient Nexletol is the the first line of treat for High LDL First line of treatment is Lipitor if patient can't tolerate this medication then we can proceed with other options

## 2023-05-23 ENCOUNTER — Ambulatory Visit: Payer: Self-pay | Admitting: *Deleted

## 2023-05-23 ENCOUNTER — Ambulatory Visit: Payer: Self-pay

## 2023-05-23 DIAGNOSIS — M1380 Other specified arthritis, unspecified site: Secondary | ICD-10-CM | POA: Insufficient documentation

## 2023-05-23 DIAGNOSIS — M1990 Unspecified osteoarthritis, unspecified site: Secondary | ICD-10-CM | POA: Insufficient documentation

## 2023-05-23 NOTE — Patient Instructions (Signed)
Visit Information  Thank you for taking time to visit with me today. Please don't hesitate to contact me if I can be of assistance to you.   Following are the goals we discussed today:  Patient will contact collection company for payment arrangements and continue to submit medica bills to Medicaid.   If you are experiencing a Mental Health or Behavioral Health Crisis or need someone to talk to, please call 911  Patient verbalizes understanding of instructions and care plan provided today and agrees to view in MyChart. Active MyChart status and patient understanding of how to access instructions and care plan via MyChart confirmed with patient.     No further follow up required: Patient does not request an additional follow up visit,  Lysle Morales, BSW Social Worker  [phone]

## 2023-05-23 NOTE — Patient Instructions (Addendum)
Visit Information  Thank you for taking time to visit with me today. Please don't hesitate to contact me if I can be of assistance to you.   Following are the goals we discussed today:   Goals Addressed             This Visit's Progress    Manage Blood Pressure & Heart Rate/Rhythm, post nasal drainage, pain, financial strain --nurse care coordination   On track    Care Coordination Goals: Patient will continue to monitor and record blood pressure and heart rate daily and will call PCP with readings outside of recommended range Patient will reach out to RN Care Coordinator [phone] with any care coordination or resource needs  Patient will try interventions & suggested over the counter medicine to help with post  Interventions Today    Flowsheet Row Most Recent Value  Chronic Disease   Chronic disease during today's visit Other  [post nasal drainage, 2025 insurance]  General Interventions   General Interventions Discussed/Reviewed General Interventions Reviewed, Durable Medical Equipment (DME), Doctor Visits, Communication with  Doctor Visits Discussed/Reviewed Doctor Visits Reviewed, Specialist  Durable Medical Equipment (DME) Other  [? CPAP He does not have one but confirms Dr Gerilyn Pilgrim had been working on that for him. Has diagnosis of sleep apnea and confirmed symptoms, Navage, humidifier]  PCP/Specialist Visits Compliance with follow-up visit  Communication with Social Work  Insurance claims handler with care coordination Social worker - Patient needs to call L Clemons back related to [employer] He confirms his DSS case worker has not returned a call to him after messages left]  Exercise Interventions   Exercise Discussed/Reviewed Exercise Reviewed, Physical Activity  Physical Activity Discussed/Reviewed Physical Activity Reviewed, Home Exercise Program (HEP)  Education Interventions   Education Provided Provided Education, Provided Web-based Education  [sleep apnea, flonase,  xyzal, navage, saline spray, post nasal drainage & causes]  Provided Verbal Education On Medication, [employer], Insurance Plans  [reviewed coverage decision for 2025,]  Mental Health Interventions   Mental Health Discussed/Reviewed Mental Health Reviewed, Coping Strategies  Pharmacy Interventions   Pharmacy Dicussed/Reviewed Pharmacy Topics Reviewed, Medications and their functions              Our next appointment is by telephone on 06/08/23 at 3:30 pm  Please call the care guide team at [phone] if you need to cancel or reschedule your appointment.   If you are experiencing a Mental Health or Behavioral Health Crisis or need someone to talk to, please call the Suicide and Crisis Lifeline: 988 call the [place] National Suicide Prevention Lifeline: 959-033-5772 or TTY: 854-528-6927 TTY 971-242-8437) to talk to a trained counselor call 1-800-273-TALK (toll free, 24 hour hotline) call the [facility]: [phone] call 911   Patient verbalizes understanding of instructions and care plan provided today and agrees to view in MyChart. Active MyChart status and patient understanding of how to access instructions and care plan via MyChart confirmed with patient.     The patient has been provided with contact information for the care management team and has been advised to call with any health related questions or concerns.   Perri Lamagna L. Noelle Penner, RN, BSN, [facility]  VBCI Care Management Coordinator  [phone]  Fax: [phone]

## 2023-05-23 NOTE — Patient Outreach (Signed)
Care Coordination   Follow Up Visit Note   05/23/2023 Name: RADARIUS SCHLICHER MRN: [id number] DOB: 19-Jun-1951  YOSEPH BUCHANAN is a 72 y.o. year old male who sees Del Newman Nip, [place], FNP for primary care. I spoke with  [name] by phone today.  What matters to the patients health and wellness today?  Patient is not eligible for full Medicaid and will submit bills to qualify for Medicaid to pay large medical expenses.    Goals Addressed             This Visit's Progress    Care Coordination Activities       Interventions Today    Flowsheet Row Most Recent Value  General Interventions   General Interventions Discussed/Reviewed General Interventions Discussed, General Interventions Reviewed  [SW reviewed information from Medicaid. Pt agreed to continue to submit bills. Pt has not contacted collection company to set up billing.]              SDOH assessments and interventions completed:  No     Care Coordination Interventions:  Yes, provided   Follow up plan: No further intervention required.   Encounter Outcome:  Patient Visit Completed

## 2023-05-23 NOTE — Patient Outreach (Addendum)
Care Coordination   Follow Up Visit Note   05/23/2023 Name: Steven [name] MRN: [id number] DOB: 02/08/1951  Steven [name] is a 72 y.o. year old male who sees Del Newman Nip, [place], FNP for primary care. I spoke with  [name] by phone today.  What matters to the patients health and wellness today?  Energy level low today, postnasal drainage Poor sleep at night generally but reports he slept well last night  See neurology on 05/28/23- Dr Levert Feinstein. Will discuss Sleep apnea treatment ? CPAP. Dr Gerilyn Pilgrim had started to assist him to try to get a CPAP prior to retiring Reports symptoms of restless sleep, trouble staying asleep, waking up gasping or choking, concentrating Loud snoring.  Postnasal drainage- Has an humidifier, Navage & azelastine / fluticasone that is not relieving his post nasal drainage. He is on claritin that he can take with his Eliquis. Considering trying another allergy medication. Stuffy nose/allergies- discussed. Will check on xyzal  2025 insurance coverage- He reports he did consult with his insurance agent and has concluded that his medicaid "SMLB"medicine assistance is why he has limitation on certain other insurance plan Has tried to get in touch with   Missed care coordination social work (SW) call - He reports he may have been sleeping. Continues to work on finances with the SW, L Clemons. Aware he is not eligible for Full  medicaid.  He was able to speak with SW in between calls with RN CM  He discussed his concern that he sent money for the ambulance bill but the ambulance company & His bank stated the payments did not go through related to a listed unknown credit card. He was not able to retrieve and email confirmation for paying the bill on his phone. With guidance from RN CM today He confirmed his deleted emails  can not be retrieve, He will check his bank account statements.    Goals Addressed             This Visit's Progress    Manage Blood  Pressure & Heart Rate/Rhythm, post nasal drainage, pain, financial strain --nurse care coordination   On track    Care Coordination Goals: Patient will continue to monitor and record blood pressure and heart rate daily and will call PCP with readings outside of recommended range Patient will reach out to RN Care Coordinator [phone] with any care coordination or resource needs  Patient will try interventions & suggested over the counter medicine to help with post  Interventions Today    Flowsheet Row Most Recent Value  Chronic Disease   Chronic disease during today's visit Other  [post nasal drainage, 2025 insurance]  General Interventions   General Interventions Discussed/Reviewed General Interventions Reviewed, Durable Medical Equipment (DME), Doctor Visits, Communication with  Doctor Visits Discussed/Reviewed Doctor Visits Reviewed, Specialist  Durable Medical Equipment (DME) Other  [? CPAP He does not have one but confirms Dr Gerilyn Pilgrim had been working on that for him. Has diagnosis of sleep apnea and confirmed symptoms, Navage, humidifier]  PCP/Specialist Visits Compliance with follow-up visit  Communication with Social Work  Insurance claims handler with care coordination Social worker - Patient needs to call L Clemons back related to [employer] He confirms his DSS case worker has not returned a call to him after messages left]  Exercise Interventions   Exercise Discussed/Reviewed Exercise Reviewed, Physical Activity  Physical Activity Discussed/Reviewed Physical Activity Reviewed, Home Exercise Program (HEP)  Education Interventions   Education Provided  Provided Education, Provided Web-based Education  [sleep apnea, flonase, xyzal, navage, saline spray, post nasal drainage & causes]  Provided Verbal Education On Medication, [employer], Development worker, community  [reviewed coverage decision for 2025,]  Mental Health Interventions   Mental Health Discussed/Reviewed Mental Health Reviewed,  Coping Strategies  Pharmacy Interventions   Pharmacy Dicussed/Reviewed Pharmacy Topics Reviewed, Medications and their functions              SDOH assessments and interventions completed:  No     Care Coordination Interventions:  Yes, provided   Follow up plan: Follow up call scheduled for 06/08/23    Encounter Outcome:  Patient Visit Completed   Cala Bradford L. Noelle Penner, RN, BSN, [facility]  VBCI Care Management Coordinator  [phone]  Fax: [phone]

## 2023-05-23 NOTE — Patient Outreach (Signed)
Care Coordination   05/23/2023 Name: ROI HJORT MRN: [id number] DOB: Apr 25, 1951   Care Coordination Outreach Attempts:  An unsuccessful telephone outreach was attempted for a scheduled appointment today.  Follow Up Plan:  Additional outreach attempts will be made to offer the patient care coordination information and services.   Encounter Outcome:  Patient Visit Completed   Care Coordination Interventions:  No, not indicated    Lysle Morales, BSW Social Worker [phone]

## 2023-05-23 NOTE — Patient Outreach (Signed)
Care Coordination   Follow Up Visit Note   05/23/2023 Name: JAVANI PFAUTZ MRN: [id number] DOB: 1951/07/01  CLEVE BEYERLE is a 72 y.o. year old male who sees Del Newman Nip, [place], FNP for primary care. I  spoke to Lubertha Sayres with Medicaid to address questions about Medicaid application.  What matters to the patients health and wellness today?  Patient is not currently eligible for full medicaid and has to meet a spend down (6 month period).    Goals Addressed             This Visit's Progress    Care Coordination Activities       Interventions Today    Flowsheet Row Most Recent Value  General Interventions   General Interventions Discussed/Reviewed General Interventions Discussed, General Interventions Reviewed, Communication with  Communication with --  [SW t/c Stephaine with Medicaid. Medicaid pays Medicare premium. Pt has large deductible to qualify for full medicaid and the amount he has submitted is not enough to meet the deductible.]              SDOH assessments and interventions completed:  No     Care Coordination Interventions:  Yes, provided   Follow up plan: No further intervention required.   Encounter Outcome:  Patient Visit Completed

## 2023-05-28 ENCOUNTER — Encounter (HOSPITAL_COMMUNITY): Payer: Self-pay | Admitting: Dentistry

## 2023-05-28 ENCOUNTER — Telehealth: Payer: Self-pay | Admitting: Psychiatry

## 2023-05-28 ENCOUNTER — Other Ambulatory Visit: Payer: Self-pay | Admitting: Gastroenterology

## 2023-05-28 ENCOUNTER — Ambulatory Visit: Payer: 59 | Admitting: Psychiatry

## 2023-05-28 ENCOUNTER — Telehealth: Payer: Self-pay | Admitting: *Deleted

## 2023-05-28 ENCOUNTER — Ambulatory Visit: Payer: 59 | Admitting: Neurology

## 2023-05-28 DIAGNOSIS — R112 Nausea with vomiting, unspecified: Secondary | ICD-10-CM

## 2023-05-28 NOTE — Telephone Encounter (Signed)
Pt is asking for a call with results to CT scan from 10-17

## 2023-05-28 NOTE — Telephone Encounter (Addendum)
Called pt back. Relayed message from Dr. Teresa Coombs. He verbalized understadning. Agreeable to stay on Emgality for now. Has pain during/after infection for about 10 sec and goes away. He has placed in arm/leg. He will try stomach for next injection due at end of the month.  Recommended he keep headache journal. He could not tell me average #of headache/migraines he has been having. Unsure if Emgality helping migraines. He will discuss further at appt with Dr. Terrace Arabia 07/16/23.  He is also eperiencing neuropathy pain all over. Describes as stabbing. On Pregabalin (PCP prescribed). I recommended he call PCP about adjusting dose.   He is also going to follow up with back doctor. Wants him to complete xray lumbar/pelvis. Has not scheduled these yet. I recommended he follow up about this to get scheduled as they were ordered 05/11/23.

## 2023-05-28 NOTE — Telephone Encounter (Signed)
Pt is asking for a call to discuss if there is anything else to be suggested for his ear ringing, pt believes it is vertigo.  Also pt states Galcanezumab-gnlm (EMGALITY) 120 MG/ML SOAJ is painful and he sometimes bleeds after the injection, he would like to know if going back on Aimovig is an option, please call.

## 2023-05-28 NOTE — Telephone Encounter (Signed)
Stanton Kidney, looks like pt completed CT at [facility]. Can you see if they can send results to be reviewed?

## 2023-05-28 NOTE — Telephone Encounter (Signed)
Received result in epic. Sent to Dr. Teresa Coombs for review. Waiting on result back.

## 2023-05-28 NOTE — Progress Notes (Signed)
Please call and advise the patient that the recent CT angiogram did not show any large vessel occlusion, no findings to explain the syncope. Please remind patient to keep any upcoming appointments or tests and to call us with any interim questions, concerns, problems or updates. Thanks,   Windell Norfolk, MD

## 2023-05-28 NOTE — Telephone Encounter (Signed)
CT angiogram has not been read yet.  He had constipation with the Aimovig, that was one of the reason for the switch. He is on Eliquis which is likely the cause of the bleed. If headaches are controlled with Emgality and no constipation, I will recommend to continue with Emgality.

## 2023-05-28 NOTE — Telephone Encounter (Signed)
Dr. Teresa Coombs- you are WID this am. Dr. Delena Bali pt.  He also asked about CT results from 05/10/23. I sent to Stanton Kidney in separate phone note to see if she can get results to be reviewed.   Do you have any recommendations for the other concerns?

## 2023-05-28 NOTE — Telephone Encounter (Signed)
-----   Message from Nurse Hampton Abbot sent at 05/28/2023  4:30 PM EST -----  ----- Message ----- From: Windell Norfolk, MD Sent: 05/28/2023   3:41 PM EST To: Arther Abbott, RN  Please call and advise the patient that the recent CT angiogram did not show any large vessel occlusion, no findings to explain the syncope. Please remind patient to keep any upcoming appointments or tests and to call us with any interim questions, c oncerns, problems or updates. Thanks,   Windell Norfolk, MD

## 2023-05-30 NOTE — Telephone Encounter (Signed)
The patient called back, Steven [name] said to have him listen to the voice mail she left him and to call back if he had any questions. He understood and agreed.

## 2023-05-30 NOTE — Telephone Encounter (Signed)
Called pt. LVM for pt about results. Asked him to call back if he has any further questions/concerns.

## 2023-06-01 ENCOUNTER — Ambulatory Visit: Payer: Medicare PPO

## 2023-06-01 ENCOUNTER — Other Ambulatory Visit: Payer: Self-pay | Admitting: Family Medicine

## 2023-06-01 ENCOUNTER — Ambulatory Visit: Payer: Medicare PPO | Admitting: Internal Medicine

## 2023-06-01 ENCOUNTER — Ambulatory Visit: Payer: Self-pay | Admitting: Family Medicine

## 2023-06-01 DIAGNOSIS — B9789 Other viral agents as the cause of diseases classified elsewhere: Secondary | ICD-10-CM

## 2023-06-01 DIAGNOSIS — R0989 Other specified symptoms and signs involving the circulatory and respiratory systems: Secondary | ICD-10-CM

## 2023-06-01 DIAGNOSIS — J988 Other specified respiratory disorders: Secondary | ICD-10-CM | POA: Diagnosis not present

## 2023-06-01 MED ORDER — PREDNISONE 20 MG PO TABS
20.0000 mg | ORAL_TABLET | Freq: Two times a day (BID) | ORAL | 0 refills | Status: AC
Start: 2023-06-01 — End: 2023-06-06

## 2023-06-01 MED ORDER — NOREL AD 4-10-325 MG PO TABS: ORAL_TABLET | ORAL | 0 refills | Status: DC

## 2023-06-01 NOTE — Telephone Encounter (Signed)
2nd attempt to call patient, no asnwer, left vmail. Through chart review, patient has been seen today at PCP office. Writer will close this encounter.

## 2023-06-01 NOTE — Telephone Encounter (Signed)
Called patient to follow-up and assess need for triage. No answer.

## 2023-06-01 NOTE — Progress Notes (Signed)
Pt received covid flu rsv swab in office and is aware he can see the results when we do on mychart

## 2023-06-01 NOTE — Telephone Encounter (Signed)
Copied from CRM [id number]. Topic: Clinical - Red Word Triage >> Jun 01, 2023  9:01 AM Dennison Nancy wrote: .Red Word that prompted transfer to Nurse Triage: red word shortness of breathe, wheezings, ,chest congestion and stuffy nose , been dealing with allergys try different medications . Callback # [phone]

## 2023-06-03 LAB — COVID-19, FLU A+B AND RSV
Influenza A, NAA: NOT DETECTED
Influenza B, NAA: NOT DETECTED
RSV, NAA: NOT DETECTED
SARS-CoV-2, NAA: NOT DETECTED

## 2023-06-04 ENCOUNTER — Ambulatory Visit: Payer: Self-pay | Admitting: Family Medicine

## 2023-06-04 ENCOUNTER — Ambulatory Visit: Payer: Self-pay | Admitting: *Deleted

## 2023-06-04 ENCOUNTER — Other Ambulatory Visit: Payer: Self-pay | Admitting: Family Medicine

## 2023-06-04 ENCOUNTER — Ambulatory Visit
Admission: EM | Admit: 2023-06-04 | Discharge: 2023-06-04 | Disposition: A | Discharge status: Home / Self Care | Payer: Medicare PPO | Attending: Nurse Practitioner | Admitting: Nurse Practitioner

## 2023-06-04 DIAGNOSIS — G894 Chronic pain syndrome: Secondary | ICD-10-CM | POA: Diagnosis not present

## 2023-06-04 MED ORDER — DEXAMETHASONE SODIUM PHOSPHATE 10 MG/ML IJ SOLN
10.0000 mg | Freq: Once | INTRAMUSCULAR | Status: AC
  Administered 2023-06-04: 10 mg via INTRAMUSCULAR

## 2023-06-04 NOTE — Patient Outreach (Signed)
Care Coordination   Follow Up Visit Note   06/04/2023  Name: Steven [name] MRN: [id number] DOB: January 13, 1951  Steven [name] is a 72 y.o. year old male who sees Del Newman Nip, [place], FNP for primary care. I spoke with Steven [name] by phone today.  What matters to the patients health and wellness today?  Receive Counseling & Supportive Services for Symptoms of Anxiety.    Goals Addressed               This Visit's Progress     Receive Counseling & Supportive Services for Symptoms of Anxiety. (pt-stated)   On track     Care Coordination Interventions:  Interventions Today    Flowsheet Row Most Recent Value  Chronic Disease   Chronic disease during today's visit Other, Hypertension (HTN), Atrial Fibrillation (AFib)  [Diffuse Pain, Recurrent Depression, History of Falls & Fall Risk, Dizzy Spells, Fibromyalgia, Sleep Apnea, Dysphagia, Prediabetes, Obesity, Post Nasal Drip, Nausea]  General Interventions   General Interventions Discussed/Reviewed General Interventions Discussed, Doctor Visits, Durable Medical Equipment (DME), General Interventions Reviewed, Annual Eye Exam, Labs, Annual Foot Exam, [facility], Health Screening, Vaccines, Communication with  [employer] with Care Team Members]  Labs Hgb A1c every 3 months, Kidney Function  [Encouraged]  Vaccines COVID-19, Flu, Pneumonia, RSV, Shingles, Tetanus/Pertussis/Diphtheria  [Encouraged]  Doctor Visits Discussed/Reviewed Doctor Visits Discussed, Doctor Visits Reviewed, Annual Wellness Visits, PCP, Specialist  [Encouraged Routine Engagement]  Health Screening Bone Density, Colonoscopy, Prostate  Durable Medical Equipment (DME) Dan Humphreys, Other  [Cane, Hand-Held Shower [place], Sports administrator, Engineer, materials in Air traffic controller, Environmental manager Standard  PCP/Specialist Visits Compliance with follow-up visit  [Encouraged]  Communication with PCP/Specialists, Charity fundraiser, Pharmacists, Social Work  [employer] Routine Engagement]   Exercise Interventions   Exercise Discussed/Reviewed Exercise Discussed, Exercise Reviewed, Physical Activity, Weight Managment, Assistive device use and maintanence  [Encouraged]  Physical Activity Discussed/Reviewed Physical Activity Discussed, Gym, PREP, Home Exercise Program (HEP), Types of exercise, Physical Activity Reviewed  [Encouraged]  Weight Management Weight loss  [Encouraged]  Education Interventions   Education Provided Provided Education  Provided Verbal Education On Nutrition, Exercise, Foot Care, Medication, When to see the doctor, Eye Care, Labs, Blood Sugar Monitoring, Mental Health/Coping with Illness, [employer], [employer]  Labs Reviewed Hgb A1c  Mental Health Interventions   Mental Health Discussed/Reviewed Mental Health Discussed, Mental Health Reviewed, Coping Strategies, Crisis, Anxiety, Depression, Grief and Loss, Substance Abuse, Suicide  [Assessed]  Nutrition Interventions   Nutrition Discussed/Reviewed Nutrition Discussed, Portion sizes, Decreasing sugar intake, Nutrition Reviewed, Increasing proteins, Carbohydrate meal planning, Decreasing fats, Adding fruits and vegetables, Decreasing salt, Fluid intake  [Encouraged]  Pharmacy Interventions   Pharmacy Dicussed/Reviewed Pharmacy Topics Discussed, Affording Medications, Pharmacy Topics Reviewed, Medications and their functions, Medication Adherence  [Encouraged Compliance]  Medication Adherence --  [Medication Compliance]  Safety Interventions   Safety Discussed/Reviewed Safety Discussed, Safety Reviewed, Fall Risk  [Encouraged Routine Use of Assistive Devices]  Home Safety Assistive Devices  [Encouraged]  Advanced Directive Interventions   Advanced Directives Discussed/Reviewed Advanced Directives Discussed, Advanced Directives Reviewed  [Encouraged Initiation of Advanced Directives, Offering to [employer] & Assist with Completion]      Active Listening & Reflection Utilized.  Verbalization of  Feelings Encouraged.  Emotional Support Provided. Problem Solving Interventions Indicated. Solution-Focused Strategies Employed. Acceptance & Commitment Therapy Performed. Cognitive Behavioral Therapy Conducted. Client-Centered Therapy Initiated. CSW Collaboration with Volney American, Nurse Triage with [facility], Primary Care Provider & Family Nurse Practitioner with [facility] [phone]), Via 3-Way Call, to Address Concerns Regarding the Following Symptoms:  Nausea  Generalized Body Aches & Body Spasms Neurologic Pain, Aching & Throbbing Unsteady Gait/Balance  Extreme Fatigue CSW Collaboration with Volney American, Nurse Triage with [facility], Primary Care Provider & Family Nurse Practitioner with [facility] [phone]), Via 3-Way Call, to Confirm No Available Appointments Prior to Scheduled Appointment on 06/07/2023 at 3:20 PM, But Encouraging Patient to Go to [facility], Due to Severity of Symptoms. CSW Collaboration with Primary Care Provider, Cruzita Lederer Newman Nip, Family Nurse Practitioner with [facility] [phone]), Via Secure Chat Message in Epic, to Report the Following Concerns, Per Your Request: Symptoms of Sinus Infection.  Over-the-Counter Medications Ineffective.  Drug Interaction with Current Medication Regimen, if Antibiotic or Azithromycin is Prescribed. Varying Degrees of Pain Still Affecting All Regions of Body. Blood Pressure Increases While Lying Down & At Rest. CSW Collaboration with Primary Care Provider, Cruzita Lederer Newman Nip, Family Nurse Practitioner with [facility] [phone]), Via Secure Chat Message in Epic, to Report the Following  Questions, Per Your Request: Can Azithromycin Treat Sinus Infection?  Can Prescription Antibiotic or Azithromycin Be Called into Pharmacy?  Status of Order for CAT Scan, Per  Request of Dr. Trena Platt, During Office Visit on 05/11/2023? Does Head Need to Be Elevated Above Heart While Lying Down & At Rest?  Is Sleeping at An Incline Recommended for Better Management of High Blood Pressure?   Encouraged Contact with CSW (# [phone]), if You Have Questions, Need Assistance, or If Additional Social Work Needs Are Identified Between Now & Our Next Follow-Up Outreach Call, Scheduled on 06/25/2023 at 9:45 AM.      SDOH assessments and interventions completed:  Yes.  Care Coordination Interventions:  Yes, provided.   Follow up plan: Follow up call scheduled for 06/25/2023 at 9:45 am.  Encounter Outcome:  Patient Visit Completed.   Danford Bad, BSW, MSW, Printmaker Social Work Case Set designer Health  [facility], Population Health Direct Dial: [phone]  Fax: [phone] Email: [email or web address]: [email or web address]

## 2023-06-04 NOTE — ED Provider Notes (Signed)
[facility]    CSN: [id number] Arrival date & time: 06/04/23  1502      History   Chief Complaint No chief complaint on file.   HPI Steven [name] is a 72 y.o. male.   Patient presents today with 42-month history of intermittent body pain in various areas of his body.  He reports his primary care provider has been adjusting his medications and he has felt more anxious about the pain recently.  He called his primary care provider today who directed him to come to urgent care.  He denies weakness in the upper or lower extremities, fever, cough, congestion, or sore throat.  He does endorse intermittent numbness/tingling in the extremities, but none currently and it does not last.  He has a neurosurgeon, neurologist and has been referred to a pain management specialist.    Past Medical History:  Diagnosis Date   Anxiety    Arthritis    Atrial fibrillation (HCC)    Atrial flutter (HCC)    BPH (benign prostatic hyperplasia)    Chest pain    Dizziness    Dyspnea    laying down occ   Fracture 08/17/2015   MULTIPLE RIB FRACTURES     FROM FALL    GERD (gastroesophageal reflux disease)    Hemorrhoids    History of kidney stones    noted on CT scan   History of radiation therapy 08/22/11-10/13/11   prostate   Hyperlipemia    Hypertension    Hyperthyroidism    IBS (irritable bowel syndrome)    Insomnia    Light headedness    Migraine    Numbness and tingling in left arm    Numbness and tingling of both legs    Open fracture of left elbow 08/18/2015   Prostate cancer [facility])    prostate s/p radiation Mar 2013   Rib fractures 08/17/2015   Tinnitus     Patient Active Problem List   Diagnosis Date Noted   Inflammatory arthritis 05/23/2023   Pain of left hip 05/11/2023   Migraine, unspecified, not intractable, without status migrainosus 05/08/2023   Depression, recurrent (HCC) 04/19/2023   Mixed hyperlipidemia 03/14/2023   Prediabetes 03/14/2023   Diffuse pain  02/19/2023   Hypertensive urgency 11/24/2022   Leukopenia 11/24/2022   SVT (supraventricular tachycardia) (HCC) 11/24/2022   Orthostatic hypotension 11/23/2022   Chest tightness 08/22/2022   Hypokalemia 08/21/2022   Calculus of gallbladder without cholecystitis without obstruction 08/09/2022   Secondary adrenocortical insufficiency (HCC) 07/19/2022   Rash and nonspecific skin eruption 05/10/2022   Dysphagia 05/10/2022   Fibromyalgia 07/07/2020   Paresthesia of upper limb 03/04/2020   Carcinoma of prostate (HCC) 02/17/2020   Subacute thyroiditis 01/07/2020   Personal history of malignant neoplasm of prostate 12/12/2019   Benign prostatic hyperplasia with urinary obstruction 12/12/2019   Weak urinary stream 12/12/2019   Hyperthyroidism 11/24/2019   Unintentional weight loss 10/21/2019   IBS (irritable bowel syndrome) 10/21/2019   Dizzy spells 09/20/2019   Intractable chronic migraine without aura 09/20/2019   Sleep apnea 09/20/2019   Psychophysiologic insomnia 09/20/2019   Rectal pain 08/21/2019   Nausea without vomiting 05/30/2019   Paroxysmal atrial flutter (HCC) 04/17/2019   Chronic atrial fibrillation (HCC) 03/02/2019   HTN (hypertension) 03/02/2019   Constipation 08/02/2017   Hemorrhoids 08/02/2017   Fall 08/18/2015   Radiation proctitis 01/30/2013   Rectal bleeding 12/10/2012   Obese 05/30/2012   Dyspnea 05/30/2012   Elevated lipids 05/30/2012   Palpitations 05/30/2012  Malignant neoplasm of prostate (HCC) 08/13/2011    Past Surgical History:  Procedure Laterality Date   BOTOX INJECTION N/A 01/14/2020   Procedure: INJECTION OF BOTOX INTO ANAL SPHINCTER;  Surgeon: Andria Meuse, MD;  Location: [facility];  Service: General;  Laterality: N/A;   COLONOSCOPY N/A 12/19/2012   WUJ:WJXBJY 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 Normal appearing colon   COLONOSCOPY WITH PROPOFOL N/A 10/23/2019   Procedure:  COLONOSCOPY WITH PROPOFOL;  Surgeon: Corbin Ade, MD; external and grade 2 internal hemorrhoids, abnormal rectal blood vessels consistent with radiation proctitis s/p APC therapy, otherwise normal exam.   EVALUATION UNDER ANESTHESIA WITH ANAL FISTULECTOMY N/A 01/14/2020   Procedure: ANORECTAL EXAM UNDER ANESTHESIA;  Surgeon: Andria Meuse, MD;  Location: [facility];  Service: General;  Laterality: N/A;   HOT HEMOSTASIS  10/23/2019   Procedure: HOT HEMOSTASIS (ARGON PLASMA COAGULATION/BICAP);  Surgeon: Corbin Ade, MD;  Location: [facility];  Service: Endoscopy;;  apc rectal proctitis     KIDNEY SURGERY  1982   kidney tube collapse repair   RADIOACTIVE SEED IMPLANT     Prostate   SVT ABLATION N/A 12/12/2022   Procedure: SVT ABLATION;  Surgeon: Marinus Maw, MD;  Location: [facility];  Service: Cardiovascular;  Laterality: N/A;       Home Medications    Prior to Admission medications   Medication Sig Start Date End Date Taking? Authorizing Provider  alfuzosin (UROXATRAL) 10 MG 24 hr tablet Take 1 tablet (10 mg total) by mouth daily with breakfast. 04/11/23   [name], Mardene Celeste, MD  amLODipine (NORVASC) 10 MG tablet Take 1 tablet (10 mg total) by mouth daily. 04/27/23 07/26/23  Wendall Stade, MD  atorvastatin (LIPITOR) 40 MG tablet Take 1 tablet (40 mg total) by mouth daily. 05/22/23   Del Nigel Berthold, FNP  Azelastine-Fluticasone 137-50 MCG/ACT SUSP Place 1 spray into the nose every 12 (twelve) hours. 01/24/23   Del Nigel Berthold, FNP  Chlorphen-PE-Acetaminophen (NOREL AD) 4-10-325 MG TABS Take 1 tablet every 4 hours while symptoms persists. Do not take more than 6 tablets in 24 hours. 06/01/23   Del Nigel Berthold, FNP  Cholecalciferol (VITAMIN D3) 125 MCG (5000 UT) CAPS Take 1 capsule by mouth daily.     [provider]  cloNIDine (CATAPRES) 0.1 MG tablet Take 1 tablet (0.1 mg total) by mouth 2 (two) times daily as needed (SBP>160). Patient  taking differently: Take 0.1 mg by mouth 3 (three) times daily. 11/27/22   Sherryll Burger, Pratik D, DO  ELIQUIS 5 MG TABS tablet TAKE 1 TABLET(5 MG) BY MOUTH TWICE DAILY 03/05/23   Wendall Stade, MD  furosemide (LASIX) 20 MG tablet Take 1 tablet (20 mg total) by mouth daily. Take with or after meal. 11/07/22   Marinus Maw, MD  Galcanezumab-gnlm [facility]) 120 MG/ML SOAJ Inject 1 Pen into the skin every 30 (thirty) days. 09/25/22   Ocie Doyne, MD  hydrOXYzine (ATARAX) 10 MG tablet Take 1 tablet (10 mg total) by mouth 3 (three) times daily as needed for anxiety. 11/26/22   Sherryll Burger, Pratik D, DO  hyoscyamine (LEVBID) 0.375 MG 12 hr tablet Take 0.375 mg by mouth 2 (two) times daily. 01/24/23   [provider]  loratadine (CLARITIN) 10 MG tablet Take 1 tablet (10 mg total) by mouth daily. 01/24/23   Del Nigel Berthold, FNP  losartan (COZAAR) 100 MG tablet Take 100  mg by mouth daily.    [provider]  methimazole (TAPAZOLE) 5 MG tablet Take 0.5 tablets (2.5 mg total) by mouth daily. 03/14/23   Roma Kayser, MD  metoprolol tartrate (LOPRESSOR) 25 MG tablet Take 1 tablet (25 mg total) by mouth 2 (two) times daily. TAKE EXTRA HALF TABLET  AS NEEDED 12/22/22 04/27/23  Sharlene Dory, PA-C  mirtazapine (REMERON) 7.5 MG tablet TAKE 1 TABLET(7.5 MG) BY MOUTH AT BEDTIME 04/18/23   Del Orbe Polanco, Iliana, FNP  NURTEC 75 MG TBDP Take 75 mg by mouth daily as needed (pain). 08/14/22   [provider]  ondansetron (ZOFRAN) 4 MG tablet Take 1 tablet (4 mg total) by mouth every 8 (eight) hours as needed for nausea or vomiting. 03/16/23   Del Newman Nip, Tenna Child, FNP  polyethylene glycol (MIRALAX / GLYCOLAX) 17 g packet Take 17 g by mouth daily. 08/23/22   Vassie Loll, MD  potassium chloride SA (KLOR-CON M) 20 MEQ tablet Take 1 tablet (20 mEq total) by mouth daily. 12/04/22   Jacalyn Lefevre, MD  predniSONE (DELTASONE) 20 MG tablet Take 1 tablet (20 mg total) by mouth 2 (two) times daily with a  meal for 5 days. 06/01/23 06/06/23  Del Nigel Berthold, FNP  pregabalin (LYRICA) 50 MG capsule Take 1 capsule (50 mg total) by mouth 2 (two) times daily. 05/04/23   Del Nigel Berthold, FNP  Probiotic Product (PROBIOTIC-10 PO) Take 1 capsule by mouth daily.    [provider]  tiZANidine (ZANAFLEX) 4 MG tablet Take 1 tablet (4 mg total) by mouth every 6 (six) hours as needed for muscle spasms. 05/11/23   Anabel Halon, MD  Ubrogepant (UBRELVY) 100 MG TABS Take 1 tablet (100 mg total) by mouth as needed (for headaches). May repeat a dose in 2 hours if needed. Max dose 2 pills in 24 hours 09/25/22   Ocie Doyne, MD  vitamin B-12 (CYANOCOBALAMIN) 100 MCG tablet Take 100 mcg by mouth daily.    [provider]  gabapentin (NEURONTIN) 300 MG capsule Take 1 capsule (300 mg total) by mouth 3 (three) times daily as needed. 02/19/23   Del Nigel Berthold, FNP    Family History Family History  Problem Relation Age of Onset   Aneurysm Mother        aortic   Hypertension Mother    Headache Mother    Prostate cancer Father    Hypertension Father    Heart failure Brother    Colon cancer Neg Hx    Colon polyps Neg Hx     Social History Social History   Tobacco Use   Smoking status: Former    Current packs/day: 0.00    Average packs/day: 1 pack/day for 20.0 years (20.0 ttl pk-yrs)    Types: Cigarettes    Start date: 2000    Quit date: 2020    Years since quitting: 4.8    Passive exposure: Past   Smokeless tobacco: Never   Tobacco comments:    Quit smoking x 2 years    07/2015   SOMETIIMES i USE VAPOR     Smoking Cessation Classes, Agencies, Public librarian  Vaping Use   Vaping status: Never Used  Substance Use Topics   Alcohol use: Never   Drug use: Never     Allergies   Latex and Meclizine   Review of Systems Review of Systems Per HPI  Physical Exam Triage Vital Signs ED Triage Vitals  Encounter Vitals Group     BP 06/04/23  1546 (!) 140/74     Systolic BP Percentile --      Diastolic BP Percentile --      Pulse Rate 06/04/23 1546 62     Resp 06/04/23 1546 16     Temp 06/04/23 1546 98.6 F (37 C)     Temp Source 06/04/23 1546 Oral     SpO2 06/04/23 1546 93 %     Weight --      Height --      Head Circumference --      Peak Flow --      Pain Score 06/04/23 1547 0     Pain Loc --      Pain Education --      Exclude from Growth Chart --    No data found.  Updated Vital Signs BP (!) 140/74 (BP Location: Right Arm)   Pulse 62   Temp 98.6 F (37 C) (Oral)   Resp 16   SpO2 93%   Visual Acuity Right Eye Distance:   Left Eye Distance:   Bilateral Distance:    Right Eye Near:   Left Eye Near:    Bilateral Near:     Physical Exam Vitals and nursing note reviewed.  Constitutional:      General: He is not in acute distress.    Appearance: Normal appearance. He is not toxic-appearing.  HENT:     Mouth/Throat:     Mouth: Mucous membranes are moist.     Pharynx: Oropharynx is clear.  Pulmonary:     Effort: Pulmonary effort is normal. No respiratory distress.  Musculoskeletal:     Comments: Patient moving all 4 extremities equally and without difficulty.  Has normal strength and sensation to upper and lower extremities and is neurovascularly intact in distal upper and lower extremities.  Skin:    General: Skin is warm and dry.     Capillary Refill: Capillary refill takes less than 2 seconds.     Coloration: Skin is not jaundiced or pale.     Findings: No erythema.  Neurological:     Mental Status: He is alert and oriented to person, place, and time.  Psychiatric:        Behavior: Behavior is cooperative.      UC Treatments / Results  Labs (all labs ordered are listed, but only abnormal results are displayed) Labs Reviewed  CBC WITH DIFFERENTIAL/PLATELET    EKG   Radiology No results found.  Procedures Procedures (including critical care time)  Medications Ordered in  UC Medications  dexamethasone (DECADRON) injection 10 mg (10 mg Intramuscular Given 06/04/23 1623)    Initial Impression / Assessment and Plan / UC Course  I have reviewed the triage vital signs and the nursing notes.  Pertinent labs & imaging results that were available during my care of the patient were reviewed by me and considered in my medical decision making (see chart for details).   Patient is well-appearing, normotensive, afebrile, not tachycardic, not tachypneic, oxygenating well on room air.   1. Chronic pain syndrome We discussed that given his pain is chronic in nature, he will need to follow up with PCP and/or specialists regarding treatment for this pain I offered an injection of Decadron today and patient was agreeable Patient requested I check blood work to check "for infection" - CBC pending ; if he does have leukocytosis likely secondary to steroid use by PCP recently unless there are elevated  neutrophils  The patient was given the opportunity to ask questions.  All questions answered to their satisfaction.  The patient is in agreement to this plan.   Final Clinical Impressions(s) / UC Diagnoses   Final diagnoses:  Chronic pain syndrome     Discharge Instructions      We have given you an injection of a steroid medication called Decadron today to help with pain.  Please follow-up with your primary care provider and specialists regarding your chronic pain.  I do not think you need to go to the hospital today unless your symptoms worsen or you develop weakness in any of your limbs.  We have checked blood counts today and will contact you tomorrow if they are abnormal.    ED Prescriptions   None    PDMP not reviewed this encounter.   Valentino Nose, NP 06/04/23 [phone]

## 2023-06-04 NOTE — Patient Instructions (Signed)
Visit Information  Thank you for taking time to visit with me today. Please don't hesitate to contact me if I can be of assistance to you.   Following are the goals we discussed today:   Goals Addressed               This Visit's Progress     Receive Counseling & Supportive Services for Symptoms of Anxiety. (pt-stated)   On track     Care Coordination Interventions:  Interventions Today    Flowsheet Row Most Recent Value  Chronic Disease   Chronic disease during today's visit Other, Hypertension (HTN), Atrial Fibrillation (AFib)  [Diffuse Pain, Recurrent Depression, History of Falls & Fall Risk, Dizzy Spells, Fibromyalgia, Sleep Apnea, Dysphagia, Prediabetes, Obesity, Post Nasal Drip, Nausea]  General Interventions   General Interventions Discussed/Reviewed General Interventions Discussed, Doctor Visits, Durable Medical Equipment (DME), General Interventions Reviewed, Annual Eye Exam, Labs, Annual Foot Exam, [employer], Health Screening, Vaccines, Communication with  [employer] with Care Team Members]  Labs Hgb A1c every 3 months, Kidney Function  [Encouraged]  Vaccines COVID-19, Flu, Pneumonia, RSV, Shingles, Tetanus/Pertussis/Diphtheria  [Encouraged]  Doctor Visits Discussed/Reviewed Doctor Visits Discussed, Doctor Visits Reviewed, Annual Wellness Visits, PCP, Specialist  [Encouraged Routine Engagement]  Health Screening Bone Density, Colonoscopy, Prostate  Durable Medical Equipment (DME) Dan Humphreys, Other  [Cane, Hand-Held Shower [place], Sports administrator, Engineer, materials in Air traffic controller, Environmental manager Standard  PCP/Specialist Visits Compliance with follow-up visit  [Encouraged]  Communication with PCP/Specialists, Charity fundraiser, Pharmacists, Social Work  [employer] Routine Engagement]  Exercise Interventions   Exercise Discussed/Reviewed Exercise Discussed, Exercise Reviewed, Physical Activity, Weight Managment, Assistive device use and maintanence  [Encouraged]  Physical Activity  Discussed/Reviewed Physical Activity Discussed, Gym, PREP, Home Exercise Program (HEP), Types of exercise, Physical Activity Reviewed  [Encouraged]  Weight Management Weight loss  [Encouraged]  Education Interventions   Education Provided Provided Education  Provided Verbal Education On Nutrition, Exercise, Foot Care, Medication, When to see the doctor, Eye Care, Labs, Blood Sugar Monitoring, Mental Health/Coping with Illness, [employer], [employer]  Labs Reviewed Hgb A1c  Mental Health Interventions   Mental Health Discussed/Reviewed Mental Health Discussed, Mental Health Reviewed, Coping Strategies, Crisis, Anxiety, Depression, Grief and Loss, Substance Abuse, Suicide  [Assessed]  Nutrition Interventions   Nutrition Discussed/Reviewed Nutrition Discussed, Portion sizes, Decreasing sugar intake, Nutrition Reviewed, Increasing proteins, Carbohydrate meal planning, Decreasing fats, Adding fruits and vegetables, Decreasing salt, Fluid intake  [Encouraged]  Pharmacy Interventions   Pharmacy Dicussed/Reviewed Pharmacy Topics Discussed, Affording Medications, Pharmacy Topics Reviewed, Medications and their functions, Medication Adherence  [Encouraged Compliance]  Medication Adherence --  [Medication Compliance]  Safety Interventions   Safety Discussed/Reviewed Safety Discussed, Safety Reviewed, Fall Risk  [Encouraged Routine Use of Assistive Devices]  Home Safety Assistive Devices  [Encouraged]  Advanced Directive Interventions   Advanced Directives Discussed/Reviewed Advanced Directives Discussed, Advanced Directives Reviewed  [Encouraged Initiation of Advanced Directives, Offering to [employer] & Assist with Completion]      Active Listening & Reflection Utilized.  Verbalization of Feelings Encouraged.  Emotional Support Provided. Problem Solving Interventions Indicated. Solution-Focused Strategies Employed. Acceptance & Commitment Therapy Performed. Cognitive Behavioral  Therapy Conducted. Client-Centered Therapy Initiated. CSW Collaboration with Volney American, Nurse Triage with [facility], Primary Care Provider & Family Nurse Practitioner with [facility] [phone]), Via 3-Way Call, to Address Concerns Regarding the Following Symptoms:  Nausea  Generalized Body Aches & Body Spasms Neurologic Pain, Aching & Throbbing Unsteady Gait/Balance  Extreme Fatigue CSW  Collaboration with Volney American, Nurse Triage with [facility] Records, Primary Care Provider & Family Nurse Practitioner with [facility] [phone]), Via 3-Way Call, to Confirm No Available Appointments Prior to Scheduled Appointment on 06/07/2023 at 3:20 PM, But Encouraging Patient to Go to [facility], Due to Severity of Symptoms. CSW Collaboration with Primary Care Provider, Cruzita Lederer Newman Nip, Family Nurse Practitioner with [facility] [phone]), Via Secure Chat Message in Epic, to Report the Following Concerns, Per Your Request: Symptoms of Sinus Infection.  Over-the-Counter Medications Ineffective.  Drug Interaction with Current Medication Regimen, if Antibiotic or Azithromycin is Prescribed. Varying Degrees of Pain Still Affecting All Regions of Body. Blood Pressure Increases While Lying Down & At Rest. CSW Collaboration with Primary Care Provider, Cruzita Lederer Newman Nip, Family Nurse Practitioner with [facility] [phone]), Via Secure Chat Message in Epic, to Report the Following  Questions, Per Your Request: Can Azithromycin Treat Sinus Infection?  Can Prescription Antibiotic or Azithromycin Be Called into Pharmacy?  Status of Order for CAT Scan, Per Request of Dr. Trena Platt, During Office Visit on 05/11/2023? Does Head Need to Be Elevated Above Heart While Lying Down & At Rest?  Is Sleeping at An Incline Recommended for Better Management of  High Blood Pressure?   Encouraged Contact with CSW (# [phone]), if You Have Questions, Need Assistance, or If Additional Social Work Needs Are Identified Between Now & Our Next Follow-Up Outreach Call, Scheduled on 06/25/2023 at 9:45 AM.      Our next appointment is by telephone on 06/25/2023 at 9:45 am.  Please call the care guide team at [phone] if you need to cancel or reschedule your appointment.   If you are experiencing a Mental Health or Behavioral Health Crisis or need someone to talk to, please call the Suicide and Crisis Lifeline: 988 call the [place] National Suicide Prevention Lifeline: (580)482-1696 or TTY: 787 638 0339 TTY 616-194-9518) to talk to a trained counselor call 1-800-273-TALK (toll free, 24 hour hotline) go to [facility] [address] [place] [phone]) call the [facility] Crisis Line: ([phone] call 911  Patient verbalizes understanding of instructions and care plan provided today and agrees to view in MyChart. Active MyChart status and patient understanding of how to access instructions and care plan via MyChart confirmed with patient.     Telephone follow up appointment with care management team member scheduled for:  06/25/2023 at 9:45 am.  Danford Bad, BSW, MSW, LCSW  Embedded Practice Social Work Case Manager  [facility], Population Health Direct Dial: [phone]  Fax: [phone] Email: [email or web address]: [email or web address]

## 2023-06-04 NOTE — Telephone Encounter (Signed)
Try incorporating gentle exercises, stretching, and relaxation techniques like deep breathing to help reduce tension and improve flexibility. Additionally, applying heat or cold packs and practicing mindfulness can help manage pain  Next visit we will discuss increasing lyrica medication

## 2023-06-04 NOTE — ED Triage Notes (Signed)
Pt c/o body aches x 1 mo, pt reports tingling in the nerves in his legs

## 2023-06-04 NOTE — Telephone Encounter (Signed)
Copied from CRM [id number]. Topic: Clinical - Red Word Triage >> Jun 04, 2023  1:32 PM Cassiday T wrote: Red Word that prompted transfer to Nurse Triage: patient is calling in stating that he has pain all over the bad sharp pain he is unbalanced and has been feeling nausea's he has pain from his head to his feet.    Chief Complaint: Generalized Body Aches Symptoms: Consistent, Random, Body Spasms, and Neurologic Pain, Aching, Throbbing.  Frequency: Inconsistent, Frequent. Pertinent Negatives: Patient denies SOB Disposition: [] ED /[x] Urgent Care (no appt availability in office) / [] Appointment(In office/virtual)/ []  [facility]/ [] Home Care/ [] Refused Recommended Disposition /[] [place] [facility]/ []  Follow-up with PCP Additional Notes: The patient is experiencing random bouts of intense pain, and body aches, muscle pain, and this is chronic pain.    Reason for Disposition  [1] SEVERE pain AND [2] taking a statin medicine (a lipid or cholesterol lowering drug)  Answer Assessment - Initial Assessment Questions 1. ONSET: "When did the muscle aches or body pains start?"      3 months ago 2. LOCATION: "What part of your body is hurting?" (e.g., entire body, arms, legs)      Entire Body  3. SEVERITY: "How bad is the pain?" (Scale 1-10; or mild, moderate, severe)   - MILD (1-3): doesn't interfere with normal activities    - MODERATE (4-7): interferes with normal activities or awakens from sleep    - SEVERE (8-10):  excruciating pain, unable to do any normal activities      8 4. CAUSE: "What do you think is causing the pains?"     Unsure 5. FEVER: "Have you been having fever?"     No 6. OTHER SYMPTOMS: "Do you have any other symptoms?" (e.g., chest pain, weakness, rash, cold or flu symptoms, weight loss)     Headache, Weight Loss, Chest Pain, Nausea.  Protocols used: Muscle Aches and Body Pain-A-AH

## 2023-06-04 NOTE — Discharge Instructions (Signed)
We have given you an injection of a steroid medication called Decadron today to help with pain.  Please follow-up with your primary care provider and specialists regarding your chronic pain.  I do not think you need to go to the hospital today unless your symptoms worsen or you develop weakness in any of your limbs.  We have checked blood counts today and will contact you tomorrow if they are abnormal.

## 2023-06-05 ENCOUNTER — Ambulatory Visit: Payer: Self-pay | Admitting: Family Medicine

## 2023-06-05 LAB — CBC WITH DIFFERENTIAL/PLATELET
Basophils Absolute: 0 10*3/uL (ref 0.0–0.2)
Basos: 1 %
EOS (ABSOLUTE): 0.1 10*3/uL (ref 0.0–0.4)
Eos: 2 %
Hematocrit: 43.1 % (ref 37.5–51.0)
Hemoglobin: 14.2 g/dL (ref 13.0–17.7)
Immature Grans (Abs): 0 10*3/uL (ref 0.0–0.1)
Immature Granulocytes: 0 %
Lymphocytes Absolute: 1.8 10*3/uL (ref 0.7–3.1)
Lymphs: 42 %
MCH: 29 pg (ref 26.6–33.0)
MCHC: 32.9 g/dL (ref 31.5–35.7)
MCV: 88 fL (ref 79–97)
Monocytes Absolute: 0.3 10*3/uL (ref 0.1–0.9)
Monocytes: 7 %
Neutrophils Absolute: 2.1 10*3/uL (ref 1.4–7.0)
Neutrophils: 48 %
Platelets: 213 10*3/uL (ref 150–450)
RBC: 4.9 x10E6/uL (ref 4.14–5.80)
RDW: 12.4 % (ref 11.6–15.4)
WBC: 4.3 10*3/uL (ref 3.4–10.8)

## 2023-06-05 NOTE — Telephone Encounter (Signed)
Copied from CRM [id number]. Topic: Clinical - Red Word Triage >> Jun 05, 2023  9:37 AM Desma Mcgregor wrote: Red Word that prompted transfer to Nurse Triage: Steven [name] to UC and given a steroid shot. Today, pt has c/o slight chest pain, highest BP today was around 160/90. Dizziness, achy eyes, frequent urination, difficulty breathing, fluttering, migraine, uncontrollable drainage every night. Looking to get advice on otc sinus med he has NOREL AD 325mg . Says it relieved the symptoms, but concerned if its possibly affecting his blood pressure. Looking to get some further instruction or advice on what to take. Cb# [id number]   Chief Complaint: pain all over Symptoms: pain, tingling sensations ongoing, increased urination Frequency: ongoing Pertinent Negatives: Patient denies n/a Disposition: [] ED /[] Urgent Care (no appt availability in office) / [] Appointment(In office/virtual)/ []  [place] [facility]/ [] Home Care/ [] Refused Recommended Disposition /[] [facility]/ [x]  Follow-up with PCP Additional Notes: pt reports pain and tingling all over: currently taking lyrica. States unable to sleep. Increased urination: has appointment with PCP on 06/07/2023  Reason for Disposition  [1] MODERATE pain (e.g., interferes with normal activities) AND [2] present > 3 days  Answer Assessment - Initial Assessment Questions 1. ONSET: "When did the muscle aches or body pains start?"      Going on for a while 2. LOCATION: "What part of your body is hurting?" (e.g., entire body, arms, legs)      All over body 3. SEVERITY: "How bad is the pain?" (Scale 1-10; or mild, moderate, severe)   - MILD (1-3): doesn't interfere with normal activities    - MODERATE (4-7): interferes with normal activities or awakens from sleep    - SEVERE (8-10):  excruciating pain, unable to do any normal activities      moderate 4. CAUSE: "What do you think is causing the pains?"     unknown 5. FEVER: "Have you been having fever?"      No fever 6. OTHER SYMPTOMS: "Do you have any other symptoms?" (e.g., chest pain, weakness, rash, cold or flu symptoms, weight loss)     Pain all over, sinus, headache, eyes irritation 7. PREGNANCY: "Is there any chance you are pregnant?" "When was your last menstrual period?"     no 8. TRAVEL: "Have you traveled out of the country in the last month?" (e.g., travel history, exposures)     no  Protocols used: Muscle Aches and Body Pain-A-AH

## 2023-06-06 ENCOUNTER — Telehealth: Payer: Self-pay | Admitting: Family Medicine

## 2023-06-06 ENCOUNTER — Telehealth: Payer: Self-pay | Admitting: Cardiovascular Disease

## 2023-06-06 DIAGNOSIS — E0590 Thyrotoxicosis, unspecified without thyrotoxic crisis or storm: Secondary | ICD-10-CM | POA: Diagnosis not present

## 2023-06-06 MED ORDER — APIXABAN 5 MG PO TABS: 5.0000 mg | ORAL_TABLET | Freq: Two times a day (BID) | ORAL | 0 refills | Status: DC

## 2023-06-06 MED ORDER — APIXABAN 5 MG PO TABS: 5.0000 mg | ORAL_TABLET | Freq: Two times a day (BID) | ORAL | 5 refills | Status: DC

## 2023-06-06 NOTE — Telephone Encounter (Signed)
Prescription refill request for Eliquis received. Indication: PAF Last office visit: 05/03/23  Rosette Reveal MD Scr: 0.88 on 04/19/23  Epic Age: 72 Weight: 90.3kg  Based on above findings Eliquis 5mg  twice daily is the appropriate dose.  Refill approve.  OK to provide samples from [place] office if available.

## 2023-06-06 NOTE — Telephone Encounter (Signed)
Pt has appointment with me tomorrow.

## 2023-06-06 NOTE — Telephone Encounter (Signed)
Pt came to office to pick medication up.

## 2023-06-06 NOTE — Telephone Encounter (Signed)
Please advise dosage. Pt is in office requesting samples of Eliquis.

## 2023-06-06 NOTE — Patient Instructions (Signed)

## 2023-06-06 NOTE — Telephone Encounter (Signed)
Sample of Eliquis 5mg  tablet left at the front of the office for patient pick up. Left a message for patient to pick samples up in the [place] office.

## 2023-06-06 NOTE — Telephone Encounter (Signed)
Pt came in office and stated that he is completely out of his Eliquis. He stated he was not able to take his pill last night and he is supposed to take it again at 10 this morning. He said the RX is out of refills and needs to be called in. He also asked if he could have samples to hold him over. [phone] is the best number to reach the pt.

## 2023-06-06 NOTE — Addendum Note (Signed)
Addended by: Roseanne Reno on: 06/06/2023 08:56 AM   Modules accepted: Orders

## 2023-06-06 NOTE — Telephone Encounter (Signed)
Copied from CRM [id number]. Topic: Clinical - Medical Advice >> Jun 06, 2023 12:31 PM Steven [name] wrote: Patient would like to speak with Dr.Polanco regarding CT for lower back and to confirm if it will be with or without contrast

## 2023-06-06 NOTE — Progress Notes (Unsigned)
   Established Patient Office Visit   Subjective  Patient ID: Steven [name], male    DOB: 23-May-1951  Age: 72 y.o. MRN: [id number]  No chief complaint on file.   He  has a past medical history of Anxiety, Arthritis, Atrial fibrillation (HCC), Atrial flutter (HCC), BPH (benign prostatic hyperplasia), Chest pain, Dizziness, Dyspnea, Fracture (08/17/2015), GERD (gastroesophageal reflux disease), Hemorrhoids, History of kidney stones, History of radiation therapy (08/22/11-10/13/11), Hyperlipemia, Hypertension, Hyperthyroidism, IBS (irritable bowel syndrome), Insomnia, Light headedness, Migraine, Numbness and tingling in left arm, Numbness and tingling of both legs, Open fracture of left elbow (08/18/2015), Prostate cancer (HCC), Rib fractures (08/17/2015), and Tinnitus.  HPI  ROS    Objective:     There were no vitals taken for this visit. {Vitals History (Optional):23777}  Physical Exam   No results found for any visits on 06/07/23.  The 10-year ASCVD risk score (Arnett DK, et al., 2019) is: 22.2%    Assessment & Plan:  There are no diagnoses linked to this encounter.  No follow-ups on file.   Cruzita Lederer Newman Nip, FNP

## 2023-06-06 NOTE — Telephone Encounter (Signed)
Copied from CRM [id number]. Topic: Clinical - Medication Refill >> Jun 06, 2023 12:25 PM Larwance Sachs wrote: Most Recent Primary Care Visit:  Provider: Abner Greenspan  Department: [facility]  Visit Type: CLINICAL SUPPORT  Date: 06/01/2023  Medication: Nextroel 180 MG  Patient is not 100% sure of spelling stated it is Cholesterol medication   Has the patient contacted their pharmacy? Yes (Agent: If no, request that the patient contact the pharmacy for the refill. If patient does not wish to contact the pharmacy document the reason why and proceed with request.) (Agent: If yes, when and what did the pharmacy advise?) Pharmacy told patient he already has PA in until December 2024 and prescription would need to be sent in order to refill    Is this the correct pharmacy for this prescription? Yes If no, delete pharmacy and type the correct one.  This is the patient's preferred pharmacy:    [id number] - [place], [place] - [address]  [place] [address] Phone: [phone] Fax: [phone]   Has the prescription been filled recently? No  Is the patient out of the medication? Yes  Has the patient been seen for an appointment in the last year OR does the patient have an upcoming appointment? No  Can we respond through MyChart? Yes  Agent: Please be advised that Rx refills may take up to 3 business days. We ask that you follow-up with your pharmacy.

## 2023-06-07 ENCOUNTER — Ambulatory Visit (INDEPENDENT_AMBULATORY_CARE_PROVIDER_SITE_OTHER): Payer: Medicare PPO | Admitting: Family Medicine

## 2023-06-07 ENCOUNTER — Ambulatory Visit (HOSPITAL_COMMUNITY)
Admission: RE | Admit: 2023-06-07 | Discharge: 2023-06-07 | Disposition: A | Discharge status: Home / Self Care | Payer: Medicare PPO | Source: Ambulatory Visit | Attending: Internal Medicine | Admitting: Internal Medicine

## 2023-06-07 ENCOUNTER — Encounter: Payer: Self-pay | Admitting: Family Medicine

## 2023-06-07 VITALS — BP 150/72 | HR 59 | Ht 67.0 in | Wt 206.1 lb

## 2023-06-07 DIAGNOSIS — J069 Acute upper respiratory infection, unspecified: Secondary | ICD-10-CM | POA: Insufficient documentation

## 2023-06-07 DIAGNOSIS — Z122 Encounter for screening for malignant neoplasm of respiratory organs: Secondary | ICD-10-CM

## 2023-06-07 DIAGNOSIS — M545 Low back pain, unspecified: Secondary | ICD-10-CM | POA: Diagnosis not present

## 2023-06-07 DIAGNOSIS — M25552 Pain in left hip: Secondary | ICD-10-CM | POA: Diagnosis not present

## 2023-06-07 DIAGNOSIS — Z789 Other specified health status: Secondary | ICD-10-CM | POA: Diagnosis not present

## 2023-06-07 DIAGNOSIS — T7840XA Allergy, unspecified, initial encounter: Secondary | ICD-10-CM

## 2023-06-07 DIAGNOSIS — M419 Scoliosis, unspecified: Secondary | ICD-10-CM | POA: Diagnosis not present

## 2023-06-07 LAB — LIPID PANEL
Chol/HDL Ratio: 3 {ratio} (ref 0.0–5.0)
Cholesterol, Total: 166 mg/dL (ref 100–199)
HDL: 55 mg/dL (ref >39)
LDL Chol Calc (NIH): 98 mg/dL (ref 0–99)
Triglycerides: 66 mg/dL (ref 0–149)
VLDL Cholesterol Cal: 13 mg/dL (ref 5–40)

## 2023-06-07 LAB — VITAMIN B12: Vitamin B-12: 652 pg/mL (ref 232–1245)

## 2023-06-07 LAB — TSH: TSH: 4.13 u[IU]/mL (ref 0.450–4.500)

## 2023-06-07 LAB — T4, FREE: Free T4: 1.22 ng/dL (ref 0.82–1.77)

## 2023-06-07 MED ORDER — BENZONATATE 200 MG PO CAPS: 200.0000 mg | ORAL_CAPSULE | Freq: Two times a day (BID) | ORAL | 0 refills | Status: DC | PRN

## 2023-06-07 MED ORDER — AZITHROMYCIN 250 MG PO TABS: ORAL_TABLET | ORAL | 0 refills | Status: DC

## 2023-06-07 MED ORDER — PREGABALIN 100 MG PO CAPS: 100.0000 mg | ORAL_CAPSULE | Freq: Two times a day (BID) | ORAL | 2 refills | Status: DC

## 2023-06-07 NOTE — Assessment & Plan Note (Addendum)
Azithromycin 250 mg twice daily x 5 days,  Benzonatate 200 mg PRN Advise patient to rest to support your body's recovery. Stay hydrated by drinking water, tea, or broth. Using a humidifier can help soothe throat irritation and ease nasal congestion. For fever or pain, acetaminophen (Tylenol) is recommended. To relieve other symptoms, try saline nasal sprays, throat lozenges, or gargling with saltwater. Focus on eating light, healthy meals like fruits and vegetables to keep your strength up. Practice good hygiene by washing your hands frequently and covering your mouth when coughing or sneezing.Follow-up for worsening or persistent symptoms. Patient verbalizes understanding regarding plan of care and all questions answered

## 2023-06-07 NOTE — Addendum Note (Signed)
Addended by: Abner Greenspan on: 06/07/2023 05:01 PM   Modules accepted: Orders

## 2023-06-08 ENCOUNTER — Telehealth: Payer: Self-pay | Admitting: Family Medicine

## 2023-06-08 ENCOUNTER — Ambulatory Visit: Payer: Self-pay | Admitting: *Deleted

## 2023-06-08 NOTE — Telephone Encounter (Signed)
Referral placed to pharmacist for cholesterol managment

## 2023-06-08 NOTE — Patient Instructions (Signed)
Visit Information  Thank you for taking time to visit with me today. Please don't hesitate to contact me if I can be of assistance to you.   Following are the goals we discussed today:   Goals Addressed   None     Our next appointment is by telephone on 07/09/23 at 3 pm  Please call the care guide team at [phone] if you need to cancel or reschedule your appointment.   If you are experiencing a Mental Health or Behavioral Health Crisis or need someone to talk to, please call the Suicide and Crisis Lifeline: 988 call the [place] National Suicide Prevention Lifeline: (640)408-7101 or TTY: 816-304-3867 TTY 678-785-3812) to talk to a trained counselor call 1-800-273-TALK (toll free, 24 hour hotline) call the [facility]: (250)346-3968 call 911   Patient verbalizes understanding of instructions and care plan provided today and agrees to view in MyChart. Active MyChart status and patient understanding of how to access instructions and care plan via MyChart confirmed with patient.     The patient has been provided with contact information for the care management team and has been advised to call with any health related questions or concerns.   Laikynn Pollio L. Noelle Penner, RN, BSN, [facility]  VBCI Care Management Coordinator  [phone]  Fax: [phone]

## 2023-06-08 NOTE — Telephone Encounter (Signed)
Patient came by office in regard to NEXLETOL 180 MG TABS [[id number]]  DISCONTINUED  States that he discussed with provider about having med sent in to pharm  Wants a call back to discuss   Also wants a call back when form is completed .

## 2023-06-08 NOTE — Telephone Encounter (Signed)
Pt. Seen in office yesterday.

## 2023-06-08 NOTE — Patient Outreach (Signed)
Care Coordination   Follow Up Visit Note   06/08/2023 Name: Steven [name] MRN: [id number] DOB: 1951-02-27  Steven [name] is a 72 y.o. year old male who sees Del Newman Nip, [place], FNP for primary care. I spoke with  [name] by phone today.  What matters to the patients health and wellness today?  Eye exam & PCP visits completed on 06/07/23   Had back xray on 06/07/23- results pending  New antibiotic and cough medicine purchased today    Trying to get medicine for cholesterol management Reports he stopped by his pcp office today to obtain forms for a handicap sticker & for medicine for cholesterol    Chronic back pain has been seen by [facility] medical. - He went and his assigned MD was not present. He cancelled all visits at [facility] in [place]. He goes to a MD in [place] for pain services   Goals Addressed             This Visit's Progress    Manage Blood Pressure & Heart Rate/Rhythm, post nasal drainage, pain, financial strain --nurse care coordination       Care Coordination Goals: Patient will continue to monitor and record blood pressure and heart rate daily and will call PCP with readings outside of recommended range Patient will reach out to RN Care Coordinator [phone] with any care coordination or resource needs  Patient will try interventions & suggested over the counter medicine to help with post nasal drip, allergies, coughs Patient will attend his pending pharmacy visit to discuss cholesterol management/medicines   Interventions Today    Flowsheet Row Most Recent Value  Chronic Disease   Chronic disease during today's visit Other, Hypertension (HTN)  [cholesterol medicine, forms completion cough medicine]  General Interventions   General Interventions Discussed/Reviewed --  [reviewed ast noted July 2024 HgA1c, 06/07/23 lipid panel and thyroid labs. encouraged him to take medicine for his cough. confirmed he no longer smokes but has noticed  mold in his home. Reviewed MD visits]  Labs Hgb A1c every 6 months  Doctor Visits Discussed/Reviewed Doctor Visits Reviewed, PCP, Specialist  PCP/Specialist Visits Compliance with follow-up visit  Communication with --  [secure chat with pcp related to cholesterol medicine patient visited the office for today. Confirmed he was on Nexletol in 2023, it was discontinued & he will be scheduled to see/speak with a pharmacist]  Education Interventions   Education Provided Provided Education  Provided Verbal Education On Labs, Mental Health/Coping with Illness, Medication, Community Resources  Labs Reviewed Hgb A1c  Mental Health Interventions   Mental Health Discussed/Reviewed Mental Health Reviewed, Coping Strategies  Nutrition Interventions   Nutrition Discussed/Reviewed Nutrition Reviewed, Adding fruits and vegetables, Decreasing sugar intake, Fluid intake  Pharmacy Interventions   Pharmacy Dicussed/Reviewed Pharmacy Topics Reviewed, Medications and their functions, Affording Medications  [safer alternatives Coricidin HBP: cold medicine for people with high blood pressure + DayQuil High Blood Pressure Cold and Flu, Discussed Nexetol and nexlizet for patients not able to tolerate statins. Discussed his referral to speak with a pharmacist]              SDOH assessments and interventions completed:  No     Care Coordination Interventions:  Yes, provided   Follow up plan: Follow up call scheduled for 07/09/23    Encounter Outcome:  Patient Visit Completed   Cala Bradford L. Noelle Penner, RN, BSN, [facility]  VBCI Care Management Coordinator  [phone]  Fax: [phone]

## 2023-06-12 ENCOUNTER — Telehealth: Payer: Self-pay

## 2023-06-12 NOTE — Progress Notes (Signed)
   Care Guide Note  06/12/2023 Name: Steven [name] MRN: [id number] DOB: 03/08/1951  Referred by: Rica Records, FNP Reason for referral : No chief complaint on file.   Steven [name] is a 72 y.o. year old male who is a primary care patient of Del Nigel Berthold, FNP. [name] was referred to the pharmacist for assistance related to HLD.    Successful contact was made with the patient to discuss pharmacy services including being ready for the pharmacist to call at least 5 minutes before the scheduled appointment time, to have medication bottles and any blood sugar or blood pressure readings ready for review. The patient agreed to meet with the pharmacist via with the pharmacist via telephone visit on (date/time).  06/20/2023  Penne Lash , RMA     [place]  [facility], [facility] Guide  Direct Dial: [phone]  Website: [email or web address]

## 2023-06-12 NOTE — Progress Notes (Signed)
error 

## 2023-06-14 ENCOUNTER — Encounter: Payer: Self-pay | Admitting: "Endocrinology

## 2023-06-14 ENCOUNTER — Ambulatory Visit: Payer: Medicare PPO | Admitting: "Endocrinology

## 2023-06-14 ENCOUNTER — Ambulatory Visit: Payer: Medicare PPO

## 2023-06-14 VITALS — BP 126/70 | HR 56 | Ht 67.0 in | Wt 203.2 lb

## 2023-06-14 DIAGNOSIS — E0590 Thyrotoxicosis, unspecified without thyrotoxic crisis or storm: Secondary | ICD-10-CM

## 2023-06-14 DIAGNOSIS — E782 Mixed hyperlipidemia: Secondary | ICD-10-CM | POA: Diagnosis not present

## 2023-06-14 DIAGNOSIS — G43501 Persistent migraine aura without cerebral infarction, not intractable, with status migrainosus: Secondary | ICD-10-CM

## 2023-06-14 MED ORDER — EZETIMIBE 10 MG PO TABS: 10.0000 mg | ORAL_TABLET | Freq: Every day | ORAL | 1 refills | Status: DC

## 2023-06-14 MED ORDER — METHIMAZOLE 5 MG PO TABS: 2.5000 mg | ORAL_TABLET | Freq: Every day | ORAL | 1 refills | Status: DC

## 2023-06-14 MED ORDER — GALCANEZUMAB-GNLM 120 MG/ML ~~LOC~~ SOAJ
120.0000 mg | Freq: Once | SUBCUTANEOUS | Status: AC
  Administered 2023-06-14: 120 mg via SUBCUTANEOUS

## 2023-06-14 NOTE — Consult Note (Signed)
Pt. Received injection in rt. Arm w/ no complication.

## 2023-06-14 NOTE — Progress Notes (Signed)
Pt. Received injection in rt. Arm w/ no complication.

## 2023-06-14 NOTE — Progress Notes (Signed)
06/14/2023     Endocrinology follow-up note   Subjective:    Patient ID: Steven [name], male    DOB: 01-21-1951, PCP Del Nigel Berthold, FNP.   Past Medical History:  Diagnosis Date   Anxiety    Arthritis    Atrial fibrillation (HCC)    Atrial flutter (HCC)    BPH (benign prostatic hyperplasia)    Chest pain    Dizziness    Dyspnea    laying down occ   Fracture 08/17/2015   MULTIPLE RIB FRACTURES     FROM FALL    GERD (gastroesophageal reflux disease)    Hemorrhoids    History of kidney stones    noted on CT scan   History of radiation therapy 08/22/11-10/13/11   prostate   Hyperlipemia    Hypertension    Hyperthyroidism    IBS (irritable bowel syndrome)    Insomnia    Light headedness    Migraine    Numbness and tingling in left arm    Numbness and tingling of both legs    Open fracture of left elbow 08/18/2015   Prostate cancer [facility])    prostate s/p radiation Mar 2013   Rib fractures 08/17/2015   Tinnitus     Past Surgical History:  Procedure Laterality Date   BOTOX INJECTION N/A 01/14/2020   Procedure: INJECTION OF BOTOX INTO ANAL SPHINCTER;  Surgeon: Andria Meuse, MD;  Location: [facility];  Service: General;  Laterality: N/A;   COLONOSCOPY N/A 12/19/2012   YQM:VHQION 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 Normal appearing colon   COLONOSCOPY WITH PROPOFOL N/A 10/23/2019   Procedure: COLONOSCOPY WITH PROPOFOL;  Surgeon: Corbin Ade, MD; external and grade 2 internal hemorrhoids, abnormal rectal blood vessels consistent with radiation proctitis s/p APC therapy, otherwise normal exam.   EVALUATION UNDER ANESTHESIA WITH ANAL FISTULECTOMY N/A 01/14/2020   Procedure: ANORECTAL EXAM UNDER ANESTHESIA;  Surgeon: Andria Meuse, MD;  Location: [facility];  Service: General;  Laterality: N/A;   HOT HEMOSTASIS  10/23/2019   Procedure: HOT HEMOSTASIS (ARGON PLASMA COAGULATION/BICAP);   Surgeon: Corbin Ade, MD;  Location: [facility];  Service: Endoscopy;;  apc rectal proctitis     KIDNEY SURGERY  1982   kidney tube collapse repair   RADIOACTIVE SEED IMPLANT     Prostate   SVT ABLATION N/A 12/12/2022   Procedure: SVT ABLATION;  Surgeon: Marinus Maw, MD;  Location: [facility]VASIVE [facility];  Service: Cardiovascular;  Laterality: N/A;    Social History   Socioeconomic History   Marital status: Single    Spouse name: Not on file   Number of children: 2   Years of education: 12   Highest education level: 12th grade  Occupational History   Occupation: Custodian     Employer: [employer]  Tobacco Use   Smoking status: Former    Current packs/day: 0.00    Average packs/day: 1 pack/day for 20.0 years (20.0 ttl pk-yrs)    Types: Cigarettes    Start date: 2000    Quit date: 2020    Years since quitting: 4.8    Passive exposure: Past   Smokeless tobacco: Never   Tobacco comments:    Quit smoking x 2 years    07/2015   SOMETIIMES i USE VAPOR     Smoking Cessation Classes, Agencies, Public librarian  Vaping Use   Vaping status:  Never Used  Substance and Sexual Activity   Alcohol use: Never   Drug use: Never   Sexual activity: Not Currently    Partners: Female  Other Topics Concern   Not on file  Social History Narrative   Lives with friend   Divorced   No caffeine   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   Social Determinants of Corporate investment banker Strain: High Risk (05/08/2023)   Overall Financial Resource Strain (CARDIA)    Difficulty of Paying Living Expenses: Hard  Food Insecurity: No Food Insecurity (05/16/2023)   Hunger Vital Sign    Worried About Running Out of Food in the Last Year: Never true    Ran Out of Food in the Last Year: Never true  Transportation Needs: No Transportation Needs (05/16/2023)   PRAPARE - Administrator, Civil Service (Medical): No    Lack of Transportation  (Non-Medical): No  Physical Activity: Sufficiently Active (03/30/2023)   Exercise Vital Sign    Days of Exercise per Week: 6 days    Minutes of Exercise per Session: 60 min  Recent Concern: Physical Activity - Inactive (03/12/2023)   Exercise Vital Sign    Days of Exercise per Week: 0 days    Minutes of Exercise per Session: 0 min  Stress: No Stress Concern Present (03/30/2023)   [employer] of Occupational Health - Occupational Stress Questionnaire    Feeling of Stress : Only a little  Recent Concern: Stress - Stress Concern Present (01/08/2023)   [employer] of Occupational Health - Occupational Stress Questionnaire    Feeling of Stress : Rather much  Social Connections: Moderately Integrated (03/30/2023)   Social Connection and Isolation Panel [NHANES]    Frequency of Communication with Friends and Family: More than three times a week    Frequency of Social Gatherings with Friends and Family: More than three times a week    Attends Religious Services: 1 to 4 times per year    Active Member of [employer] or Organizations: No    Attends Banker Meetings: Never    Marital Status: Living with partner    Family History  Problem Relation Age of Onset   Aneurysm Mother        aortic   Hypertension Mother    Headache Mother    Prostate cancer Father    Hypertension Father    Heart failure Brother    Colon cancer Neg Hx    Colon polyps Neg Hx     Outpatient Encounter Medications as of 06/14/2023  Medication Sig   cloNIDine (CATAPRES) 0.1 MG tablet Take 1 tablet (0.1 mg total) by mouth 2 (two) times daily as needed (SBP>160).   ezetimibe (ZETIA) 10 MG tablet Take 1 tablet (10 mg total) by mouth daily.   alfuzosin (UROXATRAL) 10 MG 24 hr tablet Take 1 tablet (10 mg total) by mouth daily with breakfast.   amLODipine (NORVASC) 10 MG tablet Take 1 tablet (10 mg total) by mouth daily.   apixaban (ELIQUIS) 5 MG TABS tablet Take 1 tablet (5 mg total) by mouth 2 (two) times  daily.   apixaban (ELIQUIS) 5 MG TABS tablet Take 1 tablet (5 mg total) by mouth 2 (two) times daily.   Azelastine-Fluticasone 137-50 MCG/ACT SUSP Place 1 spray into the nose every 12 (twelve) hours.   benzonatate (TESSALON) 200 MG capsule Take 1 capsule (200 mg total) by mouth 2 (two) times daily as needed for cough.  Cholecalciferol (VITAMIN D3) 125 MCG (5000 UT) CAPS Take 1 capsule by mouth daily.    furosemide (LASIX) 20 MG tablet Take 1 tablet (20 mg total) by mouth daily. Take with or after meal.   Galcanezumab-gnlm (EMGALITY) 120 MG/ML SOAJ Inject 1 Pen into the skin every 30 (thirty) days.   hydrOXYzine (ATARAX) 10 MG tablet Take 1 tablet (10 mg total) by mouth 3 (three) times daily as needed for anxiety.   hyoscyamine (LEVBID) 0.375 MG 12 hr tablet Take 0.375 mg by mouth 2 (two) times daily.   loratadine (CLARITIN) 10 MG tablet Take 1 tablet (10 mg total) by mouth daily.   losartan (COZAAR) 100 MG tablet Take 100 mg by mouth daily.   methimazole (TAPAZOLE) 5 MG tablet Take 0.5 tablets (2.5 mg total) by mouth daily.   metoprolol tartrate (LOPRESSOR) 25 MG tablet Take 1 tablet (25 mg total) by mouth 2 (two) times daily. TAKE EXTRA HALF TABLET  AS NEEDED   mirtazapine (REMERON) 7.5 MG tablet TAKE 1 TABLET(7.5 MG) BY MOUTH AT BEDTIME   NURTEC 75 MG TBDP Take 75 mg by mouth daily as needed (pain).   ondansetron (ZOFRAN) 4 MG tablet Take 1 tablet (4 mg total) by mouth every 8 (eight) hours as needed for nausea or vomiting.   polyethylene glycol (MIRALAX / GLYCOLAX) 17 g packet Take 17 g by mouth daily.   potassium chloride SA (KLOR-CON M) 20 MEQ tablet Take 1 tablet (20 mEq total) by mouth daily.   pregabalin (LYRICA) 100 MG capsule Take 1 capsule (100 mg total) by mouth 2 (two) times daily.   Probiotic Product (PROBIOTIC-10 PO) Take 1 capsule by mouth daily.   tiZANidine (ZANAFLEX) 4 MG tablet Take 1 tablet (4 mg total) by mouth every 6 (six) hours as needed for muscle spasms.   Ubrogepant  (UBRELVY) 100 MG TABS Take 1 tablet (100 mg total) by mouth as needed (for headaches). May repeat a dose in 2 hours if needed. Max dose 2 pills in 24 hours   vitamin B-12 (CYANOCOBALAMIN) 100 MCG tablet Take 100 mcg by mouth daily.   [DISCONTINUED] atorvastatin (LIPITOR) 40 MG tablet Take 1 tablet (40 mg total) by mouth daily. (Patient not taking: Reported on 06/07/2023)   [DISCONTINUED] azithromycin (ZITHROMAX) 250 MG tablet Take 2 tablets on day 1, then 1 tablet daily on days 2 through 5   [DISCONTINUED] Chlorphen-PE-Acetaminophen (NOREL AD) 4-10-325 MG TABS Take 1 tablet every 4 hours while symptoms persists. Do not take more than 6 tablets in 24 hours.   [DISCONTINUED] gabapentin (NEURONTIN) 300 MG capsule Take 1 capsule (300 mg total) by mouth 3 (three) times daily as needed.   [DISCONTINUED] methimazole (TAPAZOLE) 5 MG tablet Take 0.5 tablets (2.5 mg total) by mouth daily.   No facility-administered encounter medications on file as of 06/14/2023.    ALLERGIES: Allergies  Allergen Reactions   Latex Rash and Other (See Comments)    Possible reaction to latex gloves per patient   Meclizine Other (See Comments)    Other reaction(s): Abdominal Pain, Other    VACCINATION STATUS: Immunization History  Administered Date(s) Administered   Fluad Quad(high Dose 65+) 05/07/2023   Influenza,inj,quad, With Preservative 04/03/2019   Influenza-Unspecified 05/26/2020   Moderna Sars-Covid-2 Vaccination 09/07/2019, 10/12/2019     HPI  JAISON SCHELLE is 72 y.o. male who was previously seen in 2021 for subclinical hypothyroidism and mild hypocortisolemia.  His subsequent workup did not reveal adrenal dysfunction.  After appropriate workup, he was  diagnosed with hypothyroidism, without elevated thyroid uptake and scan evidence.  He was put on methimazole which was progressively lowered to 2.5 mg p.o. daily with clinical and biochemical response.    He has reversed his symptoms thyrotoxicosis.  He  does not have any new complaints today.  He presents with thyroid function test consistent with treatment effect towards target.   He has more stable body weight currently. he denies dysphagia, choking, shortness of breath, no recent voice change.    he denies  family history of thyroid dysfunction. He  denies family hx of thyroid cancer. he denies personal history of goiter.                           Review of systems  Limited as above.   Objective:    BP 126/70   Pulse (!) 56   Ht 5\' 7"  (1.702 m)   Wt 203 lb 3.2 oz (92.2 kg)   BMI 31.83 kg/m   Wt Readings from Last 3 Encounters:  06/14/23 203 lb 3.2 oz (92.2 kg)  06/07/23 206 lb 1.9 oz (93.5 kg)  05/11/23 199 lb 6.4 oz (90.4 kg)     His physical exam is negative for goiter.  CMP     Component Value Date/Time   NA 141 04/19/2023 2150   NA 141 04/06/2023 1135   K 3.5 04/19/2023 2150   CL 103 04/19/2023 2150   CO2 28 04/19/2023 2150   GLUCOSE 95 04/19/2023 2150   BUN 12 04/19/2023 2150   BUN 10 04/06/2023 1135   CREATININE 0.88 04/19/2023 2150   CALCIUM 9.6 04/19/2023 2150   PROT 6.8 01/07/2023 1953   ALBUMIN 3.6 01/07/2023 1953   AST 15 01/07/2023 1953   ALT 12 01/07/2023 1953   ALKPHOS 68 01/07/2023 1953   BILITOT 0.4 01/07/2023 1953   GFRNONAA >60 04/19/2023 2150   GFRAA >60 02/19/2020 1622     CBC    Component Value Date/Time   WBC 4.3 06/04/2023 1615   WBC 4.1 04/19/2023 2150   RBC 4.90 06/04/2023 1615   RBC 5.01 04/19/2023 2150   HGB 14.2 06/04/2023 1615   HCT 43.1 06/04/2023 1615   PLT 213 06/04/2023 1615   MCV 88 06/04/2023 1615   MCH 29.0 06/04/2023 1615   MCH 28.3 04/19/2023 2150   MCHC 32.9 06/04/2023 1615   MCHC 31.6 04/19/2023 2150   RDW 12.4 06/04/2023 1615   LYMPHSABS 1.8 06/04/2023 1615   MONOABS 0.2 01/07/2023 1953   EOSABS 0.1 06/04/2023 1615   BASOSABS 0.0 06/04/2023 1615   Lipid Panel     Component Value Date/Time   CHOL 166 06/06/2023 1531   TRIG 66 06/06/2023 1531    HDL 55 06/06/2023 1531   CHOLHDL 3.0 06/06/2023 1531   CHOLHDL 4.4 03/02/2019 0329   VLDL 18 03/02/2019 0329   LDLCALC 98 06/06/2023 1531   LABVLDL 13 06/06/2023 1531     Lab Results  Component Value Date   TSH 4.130 06/06/2023   TSH 7.020 (H) 03/08/2023   TSH 8.490 (H) 01/12/2023   TSH 3.950 12/20/2022   TSH 0.119 (L) 10/06/2022   TSH 0.015 (L) 09/11/2022   TSH 0.011 (L) 09/08/2022   TSH 0.018 (L) 08/22/2022   TSH 0.030 (L) 08/13/2022   TSH 0.036 (L) 08/10/2022   FREET4 1.22 06/06/2023   FREET4 0.77 (L) 03/08/2023   FREET4 0.97 01/12/2023   FREET4 1.10 12/20/2022   FREET4 1.48  10/06/2022   FREET4 1.77 09/11/2022   FREET4 2.16 (H) 08/21/2022   FREET4 1.71 (H) 08/01/2022   FREET4 1.19 07/14/2020   FREET4 1.37 01/01/2020    Thyroid uptake and scan on Nov 28, 2019 FINDINGS: There is fairly homogeneous thyroid uptake. The left lobe extends more inferiorly than the right, although no focal hot or cold nodules are identified.   4 hour I-123 uptake = 4.1% (normal 5-20%)  24 hour I-123 uptake = 12.9% (normal 10-30%)   IMPRESSION: 1. Low normal radioactive iodine uptake by the thyroid gland. 2. No focal hot or cold nodules identified.    Assessment & Plan:   1. Hyperthyroidism-  2.  Hyperlipidemia    3.  Prediabetes   4.  Hypervitaminosis B12 His previsit labs are consistent with treatment effect with improving thyroid function profile.  He is advised to continue methimazole 2.5 mg p.o. daily with breakfast.    He will return to clinic for follow-up with repeat thyroid function tests in 6 months.   His recent adrenal assessment is indicated  sufficient adrenal response including random cortisol and ACTH stimulation test ruling out adrenal insufficiency.  In light of his metabolic dysfunction indicated by prediabetes, hyperlipidemia, whole food plant-based diet was discussed with him. He did not take his statin and several weeks.  I discussed and initiated Zetia 10 mg  p.o. daily at breakfast to help him control dyslipidemia. -He is also advised to stay off of his vitamin B12 supplements, labs show normal indicate better levels at 652.    -Patient is advised to maintain close follow up with Del Nigel Berthold, FNP for primary care needs.  I spent  22  minutes in the care of the patient today including review of labs from Thyroid Function, CMP, and other relevant labs ; imaging/biopsy records (current and previous including abstractions from other facilities); face-to-face time discussing  his lab results and symptoms, medications doses, his options of short and long term treatment based on the latest standards of care / guidelines;   and documenting the encounter.  Steven [name]  participated in the discussions, expressed understanding, and voiced agreement with the above plans.  All questions were answered to his satisfaction. he is encouraged to contact clinic should he have any questions or concerns prior to his return visit.     Follow up plan: Return in about 4 months (around 10/12/2023) for Fasting Labs  in AM B4 8.   Thank you for involving me in the care of this pleasant patient, and I will continue to update you with his progress.  Marquis Lunch, MD [facility] Phone: [phone]  Fax: [phone]   06/14/2023, 4:51 PM  This note was partially dictated with voice recognition software. Similar sounding words can be transcribed inadequately or may not  be corrected upon review.

## 2023-06-15 ENCOUNTER — Telehealth: Payer: Self-pay | Admitting: Urology

## 2023-06-15 ENCOUNTER — Ambulatory Visit: Payer: Medicare PPO | Admitting: Professional Counselor

## 2023-06-15 DIAGNOSIS — F339 Major depressive disorder, recurrent, unspecified: Secondary | ICD-10-CM

## 2023-06-15 NOTE — BH Specialist Note (Unsigned)
Collaborative Care Initial Assessment  Session Start time: 1:00 pm   Session End time: 2:00 pm  Total time in minutes: 60 min   Type of Contact:  Face to Face Patient consent obtained:  Yes Types of Service: Collaborative care  Summary  Patient is a 72 yo male being referred to collaborative care by his pcp for anxiety and depression. Patient was engaged and cooperative during session.   Reason for referral in patient/family's own words:  "Ive ben told I have anxiety and bouts of depression"  Patient's goal for today's visit: "Not sure"  History of Present illness:   The patient is a 72 year old male with a history of depression and anxiety who presents with difficulties related to aging and adjusting to a recent diagnosis of atrial fibrillation (AFib). He reports feeling low energy, being less active, and having little desire to go out. He describes having panic attacks but is uncertain about their triggers. His AFib diagnosis, along with a diagnosis of IBS and thyroid issues in 2020, has significantly impacted him, and he has struggled to adapt to these health challenges.  The patient shares that his condition requires frequent visits to specialists and extensive testing, which has left him feeling physically and emotionally worn down. Additionally, he is experiencing financial stress due to mounting medical bills. These factors have contributed to his overall difficulty in managing day-to-day life. He spends much of his time alone, as his spouse, though supportive, works long hours.  He denies any psychiatric history, trauma, or substance abuse. His primary goal is to receive supportive counseling to help him cope with the aging process, his health concerns, and associated stressors. A psychiatric consultation is planned to evaluate his needs and develop an appropriate treatment plan. The focus will be on providing tools and support to help him navigate these changes and improve his  overall well-being. Clinical Assessment   PHQ-9 Assessments:    06/15/2023    1:34 PM 05/11/2023    8:06 AM 04/19/2023   11:08 AM 04/06/2023   10:48 AM 03/30/2023    8:48 AM  Depression screen PHQ 2/9  Decreased Interest 1 0 0 0 1  Down, Depressed, Hopeless  0 0 0 1  PHQ - 2 Score 1 0 0 0 2  Altered sleeping 1 0 0 0   Tired, decreased energy 1 0 0 0   Change in appetite 1 0 0 0   Feeling bad or failure about yourself  1 0 0 0   Trouble concentrating  0 0 0   Moving slowly or fidgety/restless  0 0 0   Suicidal thoughts  0 0 0   PHQ-9 Score 5 0 0 0   Difficult doing work/chores  Not difficult at all Not difficult at all Not difficult at all     GAD-7 Assessments:    05/11/2023    8:06 AM 04/19/2023   11:09 AM 04/06/2023   10:48 AM 02/19/2023    1:31 PM  GAD 7 : Generalized Anxiety Score  Nervous, Anxious, on Edge 0 0 0 1  Control/stop worrying 0 0 0 0  Worry too much - different things 0 0 0 1  Trouble relaxing 0 0 0 1  Restless 0 0 0 0  Easily annoyed or irritable 0 0 0 0  Afraid - awful might happen 0 0 0 1  Total GAD 7 Score 0 0 0 4  Anxiety Difficulty Not difficult at all Not difficult at all Not difficult at  all Not difficult at all     Social History:  Household: Lives with spouse Marital status: 18 year relationship Number of Children: 3 children Employment: Custodian. Retired Education: High school  Psychiatric Review of systems: Insomnia: No Changes in appetite: none Decreased need for sleep: No Family history of bipolar disorder: No Hallucinations: No   Paranoia: No    Psychotropic medications: Current medications: None Patient taking medications as prescribed:  Yes or No Side effects reported: Yes or No   Current medications (medication list) Current Outpatient Medications on File Prior to Visit  Medication Sig Dispense Refill   alfuzosin (UROXATRAL) 10 MG 24 hr tablet Take 1 tablet (10 mg total) by mouth daily with breakfast. 90 tablet 3    amLODipine (NORVASC) 10 MG tablet Take 1 tablet (10 mg total) by mouth daily. 90 tablet 3   apixaban (ELIQUIS) 5 MG TABS tablet Take 1 tablet (5 mg total) by mouth 2 (two) times daily. 60 tablet 5   apixaban (ELIQUIS) 5 MG TABS tablet Take 1 tablet (5 mg total) by mouth 2 (two) times daily. 14 tablet 0   Azelastine-Fluticasone 137-50 MCG/ACT SUSP Place 1 spray into the nose every 12 (twelve) hours. 23 g 1   benzonatate (TESSALON) 200 MG capsule Take 1 capsule (200 mg total) by mouth 2 (two) times daily as needed for cough. 20 capsule 0   Cholecalciferol (VITAMIN D3) 125 MCG (5000 UT) CAPS Take 1 capsule by mouth daily.      cloNIDine (CATAPRES) 0.1 MG tablet Take 1 tablet (0.1 mg total) by mouth 2 (two) times daily as needed (SBP>160). 60 tablet 11   ezetimibe (ZETIA) 10 MG tablet Take 1 tablet (10 mg total) by mouth daily. 90 tablet 1   furosemide (LASIX) 20 MG tablet Take 1 tablet (20 mg total) by mouth daily. Take with or after meal. 90 tablet 3   Galcanezumab-gnlm (EMGALITY) 120 MG/ML SOAJ Inject 1 Pen into the skin every 30 (thirty) days. 1.12 mL 7   hydrOXYzine (ATARAX) 10 MG tablet Take 1 tablet (10 mg total) by mouth 3 (three) times daily as needed for anxiety. 30 tablet 0   hyoscyamine (LEVBID) 0.375 MG 12 hr tablet Take 0.375 mg by mouth 2 (two) times daily.     loratadine (CLARITIN) 10 MG tablet Take 1 tablet (10 mg total) by mouth daily. 30 tablet 11   losartan (COZAAR) 100 MG tablet Take 100 mg by mouth daily.     methimazole (TAPAZOLE) 5 MG tablet Take 0.5 tablets (2.5 mg total) by mouth daily. 45 tablet 1   metoprolol tartrate (LOPRESSOR) 25 MG tablet Take 1 tablet (25 mg total) by mouth 2 (two) times daily. TAKE EXTRA HALF TABLET  AS NEEDED 180 tablet 2   mirtazapine (REMERON) 7.5 MG tablet TAKE 1 TABLET(7.5 MG) BY MOUTH AT BEDTIME 30 tablet 1   NURTEC 75 MG TBDP Take 75 mg by mouth daily as needed (pain).     ondansetron (ZOFRAN) 4 MG tablet Take 1 tablet (4 mg total) by mouth  every 8 (eight) hours as needed for nausea or vomiting. 20 tablet 1   polyethylene glycol (MIRALAX / GLYCOLAX) 17 g packet Take 17 g by mouth daily. 30 each 2   potassium chloride SA (KLOR-CON M) 20 MEQ tablet Take 1 tablet (20 mEq total) by mouth daily. 30 tablet 0   pregabalin (LYRICA) 100 MG capsule Take 1 capsule (100 mg total) by mouth 2 (two) times daily. 60 capsule 2  Probiotic Product (PROBIOTIC-10 PO) Take 1 capsule by mouth daily.     tiZANidine (ZANAFLEX) 4 MG tablet Take 1 tablet (4 mg total) by mouth every 6 (six) hours as needed for muscle spasms. 30 tablet 0   Ubrogepant (UBRELVY) 100 MG TABS Take 1 tablet (100 mg total) by mouth as needed (for headaches). May repeat a dose in 2 hours if needed. Max dose 2 pills in 24 hours 16 tablet 6   vitamin B-12 (CYANOCOBALAMIN) 100 MCG tablet Take 100 mcg by mouth daily.     [DISCONTINUED] gabapentin (NEURONTIN) 300 MG capsule Take 1 capsule (300 mg total) by mouth 3 (three) times daily as needed. 90 capsule 3   No current facility-administered medications on file prior to visit.    Psychiatric History: Past psychiatry diagnosis: No Patient currently being seen by therapist/psychiatrist: No  Prior Suicide Attempts: No Past psychiatry Hospitalization(s): No Past history of violence: No  Traumatic Experiences: History or current traumatic events (natural disaster, house fire, etc.)? no History or current physical trauma?  no History or current emotional trauma?  no History or current sexual trauma?  no History or current domestic or intimate partner violence?  no PTSD symptoms if any traumatic experiences no   Alcohol and/or Substance Use History   Tobacco Alcohol Other substances  Current use Smoke pack a day 2-3 beers twice a month   Past use     Past treatment      Withdrawal Potential: None  Self-harm Behaviors Risk Assessment Self-harm risk factors:  None Patient endorses recent thoughts of harming self: Denies  Guns  in the home: Yes.    Protective factors: Family, kids, no past thoughts  Danger to Others Risk Assessment Danger to others risk factors:   Patient endorses recent thoughts of harming others:   Dynamic Appraisal of Situational Aggression (DASA):   BH Counselor discussed emergency crisis plan with client and provided local emergency services resources.  Mental status exam:   General Appearance Luretha Murphy:  Casual Eye Contact:  Good Motor Behavior:  Normal Speech:  Normal Level of Consciousness:  Alert Mood:  Depressed Affect:  Appropriate Anxiety Level:  Minimal Thought Process:  Coherent Thought Content:  WNL Perception:  Normal Judgment:  Good Insight:  Present  Diagnosis:   Goals: Increase healthy adjustment to current life circumstances   Interventions: Behavioral Activation and Supportive Counseling   Follow-up Plan:  Two week follow up

## 2023-06-15 NOTE — Telephone Encounter (Signed)
Patient called he needs his medication called into [facility] on [address]   alfuzosin (UROXATRAL) 10 MG 24

## 2023-06-15 NOTE — Telephone Encounter (Signed)
Lvm notifying Pt that refills are at pharmacy

## 2023-06-18 ENCOUNTER — Encounter: Payer: Self-pay | Admitting: Family Medicine

## 2023-06-18 ENCOUNTER — Ambulatory Visit: Payer: Medicare HMO | Admitting: Internal Medicine

## 2023-06-18 ENCOUNTER — Ambulatory Visit (INDEPENDENT_AMBULATORY_CARE_PROVIDER_SITE_OTHER): Payer: Medicare PPO | Admitting: Family Medicine

## 2023-06-18 VITALS — BP 131/81 | HR 63 | Ht 67.0 in | Wt 204.0 lb

## 2023-06-18 DIAGNOSIS — F32A Depression, unspecified: Secondary | ICD-10-CM | POA: Diagnosis not present

## 2023-06-18 DIAGNOSIS — F419 Anxiety disorder, unspecified: Secondary | ICD-10-CM

## 2023-06-18 MED ORDER — MIRTAZAPINE 15 MG PO TABS: 15.0000 mg | ORAL_TABLET | Freq: Every day | ORAL | 2 refills | Status: DC

## 2023-06-18 MED ORDER — SERTRALINE HCL 25 MG PO TABS: 25.0000 mg | ORAL_TABLET | Freq: Every day | ORAL | 3 refills | Status: DC

## 2023-06-18 NOTE — Progress Notes (Signed)
Established Patient Office Visit   Subjective  Patient ID: Steven [name], male    DOB: 03-13-1951  Age: 72 y.o. MRN: [id number]  Chief Complaint  Patient presents with   Medical Management of Chronic Issues    Infrequent spikes in B/P  Labs ordered if needed, not fasting . Wants to speak about anxiety medication.     He  has a past medical history of Anxiety, Arthritis, Atrial fibrillation (HCC), Atrial flutter (HCC), BPH (benign prostatic hyperplasia), Chest pain, Dizziness, Dyspnea, Fracture (08/17/2015), GERD (gastroesophageal reflux disease), Hemorrhoids, History of kidney stones, History of radiation therapy (08/22/11-10/13/11), Hyperlipemia, Hypertension, Hyperthyroidism, IBS (irritable bowel syndrome), Insomnia, Light headedness, Migraine, Numbness and tingling in left arm, Numbness and tingling of both legs, Open fracture of left elbow (08/18/2015), Prostate cancer (HCC), Rib fractures (08/17/2015), and Tinnitus.  HPI Patient presents to the clinic for anxiety and depression follow up. Patient reports a lack of motivation and describes feeling aimless, as though there is nothing meaningful to engage in or accomplish  For the details of today's visit, please refer to assessment and plan.   Review of Systems  Constitutional:  Negative for chills and fever.  Respiratory:  Negative for shortness of breath.   Cardiovascular:  Negative for chest pain.  Neurological:  Negative for dizziness.  Psychiatric/Behavioral:  Positive for depression. The patient is nervous/anxious.       Objective:     BP 131/81   Pulse 63   Ht 5\' 7"  (1.702 m)   Wt 204 lb (92.5 kg)   SpO2 96%   BMI 31.95 kg/m  BP Readings from Last 3 Encounters:  06/18/23 131/81  06/14/23 126/70  06/07/23 (!) 150/72      Physical Exam Vitals reviewed.  Constitutional:      General: He is not in acute distress.    Appearance: Normal appearance. He is not ill-appearing, toxic-appearing or diaphoretic.  HENT:      Head: Normocephalic.  Eyes:     General:        Right eye: No discharge.        Left eye: No discharge.     Conjunctiva/sclera: Conjunctivae normal.  Cardiovascular:     Rate and Rhythm: Normal rate.     Pulses: Normal pulses.     Heart sounds: Normal heart sounds.  Pulmonary:     Effort: Pulmonary effort is normal. No respiratory distress.     Breath sounds: Normal breath sounds.  Skin:    General: Skin is warm.  Neurological:     Mental Status: He is alert.  Psychiatric:        Mood and Affect: Mood normal.        Behavior: Behavior normal.      No results found for any visits on 06/18/23.  The 10-year ASCVD risk score (Arnett DK, et al., 2019) is: 18.9%    Assessment & Plan:  Anxiety and depression Assessment & Plan: Flowsheet Row Office Visit from 06/18/2023 in [facility]  PHQ-9 Total Score 3          06/18/2023    3:11 PM 05/11/2023    8:06 AM 04/19/2023   11:09 AM 04/06/2023   10:48 AM  GAD 7 : Generalized Anxiety Score  Nervous, Anxious, on Edge 1 0 0 0  Control/stop worrying 1 0 0 0  Worry too much - different things 1 0 0 0  Trouble relaxing 0 0 0 0  Restless 0 0 0 0  Easily annoyed or irritable 1 0 0 0  Afraid - awful might happen 1 0 0 0  Total GAD 7 Score 5 0 0 0  Anxiety Difficulty Somewhat difficult Not difficult at all Not difficult at all Not difficult at all  Increased Remeron 15 mg once daily Follow up with Arlys John on 06/28/23  We discussed several non-pharmacological approaches to managing  anxiety and depression, including:  Establishing a consistent daily routine: This helps create structure and stability. Practicing mindfulness and relaxation techniques: Incorporating meditation, deep breathing exercises, or yoga to manage stress and improve emotional well-being. Engaging in regular physical activity: Aim for at least 30 minutes of exercise most days to boost mood and energy levels. Spending time outdoors:  Exposure to natural light and fresh air can improve mental health. Building a support network: Encouraging social connections with friends, family, or support groups to reduce feelings of isolation. Prioritizing a balanced diet: Eating nutrient-rich foods while avoiding excessive amounts of processed foods, sugar, and unhealthy fats. Follow-up is recommended in 4-8 weeks to assess progress, with a referral to behavioral health for further support if needed.    Other orders -     Mirtazapine; Take 1 tablet (15 mg total) by mouth at bedtime.  Dispense: 30 tablet; Refill: 2    Return in about 4 months (around 10/16/2023), or if symptoms worsen or fail to improve, for chronic follow-up, routine labs.   Cruzita Lederer Newman Nip, FNP

## 2023-06-18 NOTE — Patient Instructions (Signed)
If your symptoms worsen or you have thoughts of suicide/homicide, PLEASE SEEK IMMEDIATE MEDICAL ATTENTION.  You may always call:   National Suicide Hotline: 988 or 812-756-8526 [place] Crisis Line: 854-781-6439 Crisis Recovery in [place]: [phone]     These are available 24 hours a day, 7 days a week.

## 2023-06-18 NOTE — Assessment & Plan Note (Addendum)
Flowsheet Row Office Visit from 06/18/2023 in [facility]  PHQ-9 Total Score 3          06/18/2023    3:11 PM 05/11/2023    8:06 AM 04/19/2023   11:09 AM 04/06/2023   10:48 AM  GAD 7 : Generalized Anxiety Score  Nervous, Anxious, on Edge 1 0 0 0  Control/stop worrying 1 0 0 0  Worry too much - different things 1 0 0 0  Trouble relaxing 0 0 0 0  Restless 0 0 0 0  Easily annoyed or irritable 1 0 0 0  Afraid - awful might happen 1 0 0 0  Total GAD 7 Score 5 0 0 0  Anxiety Difficulty Somewhat difficult Not difficult at all Not difficult at all Not difficult at all  Increased Remeron 15 mg once daily Follow up with Arlys John on 06/28/23  We discussed several non-pharmacological approaches to managing  anxiety and depression, including:  Establishing a consistent daily routine: This helps create structure and stability. Practicing mindfulness and relaxation techniques: Incorporating meditation, deep breathing exercises, or yoga to manage stress and improve emotional well-being. Engaging in regular physical activity: Aim for at least 30 minutes of exercise most days to boost mood and energy levels. Spending time outdoors: Exposure to natural light and fresh air can improve mental health. Building a support network: Encouraging social connections with friends, family, or support groups to reduce feelings of isolation. Prioritizing a balanced diet: Eating nutrient-rich foods while avoiding excessive amounts of processed foods, sugar, and unhealthy fats. Follow-up is recommended in 4-8 weeks to assess progress, with a referral to behavioral health for further support if needed.

## 2023-06-18 NOTE — Patient Instructions (Addendum)
        Great to see you today.  I have refilled the medication(s) we provide.    Please follow up if Remeron medication is not helping via my chart or call.  If labs were collected, we will inform you of lab results once received either by echart message or telephone call.   - echart message- for normal results that have been seen by the patient already.   - telephone call: abnormal results or if patient has not viewed results in their echart.   - Please take medications as prescribed. - Follow up with your primary health provider if any health concerns arises. - If symptoms worsen please contact your primary care provider and/or visit the emergency department.

## 2023-06-19 ENCOUNTER — Telehealth: Payer: Self-pay | Admitting: Cardiovascular Disease

## 2023-06-19 NOTE — Telephone Encounter (Signed)
   Pt c/o of Chest Pain: STAT if active CP, including tightness, pressure, jaw pain, radiating pain to shoulder/upper arm/back, CP unrelieved by Nitro. Symptoms reported of SOB, nausea, vomiting, sweating.  1. Are you having CP right now? No     2. Are you experiencing any other symptoms (ex. SOB, nausea, vomiting, sweating)? Fatigue,sob, nausea   3. Is your CP continuous or coming and going? Coming and going    4. Have you taken Nitroglycerin? no   5. How long have you been experiencing CP? On and off for 2 weeks    6. If NO CP at time of call then end call with telling Pt to call back or call 911 if Chest pain returns prior to return call from triage team.

## 2023-06-19 NOTE — Telephone Encounter (Signed)
I spoke with patient.  He reports recently he has not felt like doing much.  Allergies have been bothering him.  Recently started on Claritin and an antibiotic.  Has been having chest pain more often the last couple of weeks. Did not discuss with PCP at office visit yesterday.  States PCP told him that his wheezing sounded better.  Patient had pain under his rib cage earlier today that lasted about 15 minutes.  Feels more fatigued recently when doing activities such as sweeping the floor. Will forward to Dr Eden Emms for  review/recommendations. ED precautions reviewed with patient.

## 2023-06-19 NOTE — Telephone Encounter (Signed)
Steven Stade, MD     06/19/23  4:48 PM F/U with primary these are not heart issues He has a long history of anxiety and somatic complaints  I spoke with patient and let him know Dr Eden Emms would like him to see his primary care provider

## 2023-06-20 ENCOUNTER — Other Ambulatory Visit: Payer: Self-pay | Admitting: Pharmacist

## 2023-06-22 ENCOUNTER — Ambulatory Visit: Payer: Medicare HMO | Admitting: Family Medicine

## 2023-06-25 ENCOUNTER — Encounter: Payer: Self-pay | Admitting: *Deleted

## 2023-06-25 ENCOUNTER — Ambulatory Visit: Payer: Self-pay | Admitting: *Deleted

## 2023-06-25 NOTE — Patient Outreach (Signed)
Care Coordination   Follow Up Visit Note   06/25/2023  Name: Steven [name] MRN: [id number] DOB: [birth year]  Steven [name] is a 72 y.o. year old male who sees Del Newman Nip, [place], FNP for primary care. I spoke with [name] by phone today.  What matters to the patients health and wellness today?  Receive Counseling & Supportive Services for Symptoms of Anxiety.    Goals Addressed               This Visit's Progress     Receive Counseling & Supportive Services for Symptoms of Anxiety. (pt-stated)   On track     Care Coordination Interventions:  Interventions Today    Flowsheet Row Most Recent Value  Chronic Disease   Chronic disease during today's visit Atrial Fibrillation (AFib), Hypertension (HTN), Other  [Diffuse Pain, Recurrent Depression, History of Falls & Fall Risk, Dizzy Spells, Fibromyalgia, Sleep Apnea, Dysphagia, Prediabetes, Obesity, Post Nasal Drip, Nausea]  General Interventions   General Interventions Discussed/Reviewed General Interventions Discussed, General Interventions Reviewed, Communication with, Doctor Visits, [facility], Level of Care  [Communication with Care Team Members]  Doctor Visits Discussed/Reviewed Doctor Visits Discussed, Doctor Visits Reviewed, Annual Wellness Visits, PCP, Specialist  [Encouraged Routine Engagement]  PCP/Specialist Visits Compliance with follow-up visit  [Encouraged Routine Engagement]  Communication with PCP/Specialists, Charity fundraiser, Pharmacists, Social Work  [employer] Routine Engagement]  Level of Care Adult Daycare, Air traffic controller, Assisted Living, Personal Care Services  [Confirmed Disinterest in Enrollment in Adult Day Care Program, Applying for Personal Care Services, or Pursuing Higher Level of Care Placement Options]  Applications Medicaid, Personal Care Services  [Confirmed Disinterest in Applying for Medicaid or Personal Care Services]  Education Interventions   Education Provided Provided Education   Provided Verbal Education On When to see the doctor, [facility], Mental Health/Coping with Illness, Development worker, community, Production assistant, radio, Personal Care Services  [Confirmed Disinterest in Applying for Medicaid or Personal Care Services]  Mental Health Interventions   Mental Health Discussed/Reviewed Mental Health Discussed, Grief and Loss, Substance Abuse, Mental Health Reviewed, Suicide, Coping Strategies, Crisis, Anxiety, Depression  [Assessed Mental Health Status]  Safety Interventions   Safety Discussed/Reviewed Safety Discussed, Safety Reviewed, Fall Risk, Home Safety  [Encouraged Consideration of Home Safety Evaluation]  Home Safety Assistive Devices, Need for home safety assessment  [Encouraged Routine Use of Assistive Devices]  Advanced Directive Interventions   Advanced Directives Discussed/Reviewed Advanced Directives Discussed, Advanced Directives Reviewed  [Encouraged Initiation of Advanced Directives, Offering to [employer] & Assist with Completion]      Active Listening & Reflection Utilized.  Verbalization of Feelings Encouraged.  Emotional Support Provided. Acceptance & Commitment Therapy Performed. Cognitive Behavioral Therapy Conducted. Client-Centered Therapy Initiated. CSW Collaboration with Rica Records, Nurse Practitioner & Primary Care Provider with [facility] [phone]), Via Routed Note in Epic, to Request Prescription for Psychotropic Medication for Management & Treatment of Depression & Anxiety Symptoms. CSW Collaboration with Rica Records, Nurse Practitioner & Primary Care Provider with [facility] [phone]), Via Routed Note in Epic, to Report Inability to Administer Prescription for Psychotropic Medication, Zoloft for Management & Treatment of Depression & Anxiety Symptoms, Due to Severity of Side Effects, Per Your Request.  CSW Collaboration with Rica Records, Nurse Practitioner & Primary Care Provider with [facility] [phone]), Via Routed Note in Epic, to Request Consideration of Prescribing New Psychotropic Medication for Management &  Treatment of Depression & Anxiety Symptoms, Per Your Request. CSW Collaboration with Dr. Charlton Haws, Cardiologist with [facility] [phone]# [phone]), Via Routed Note in Epic, to Report the Following Symptoms, Per Your Request:  Elevated Blood Pressure Chest Pain Heart Palpitations Heart Flutters Fatigue  Shortness of Breath Encouraged Routine Engagement with Danford Bad, Licensed Clinical Social Worker with [facility] [phone]), if You Have Questions, Need Assistance, or If Additional Social Work Needs Are Identified Between Now & Our Next Follow-Up Outreach Call, Scheduled on 07/12/2023 at 2:15 PM.        SDOH assessments and interventions completed:  Yes.  SDOH Interventions Today    Flowsheet Row Most Recent Value  SDOH Interventions   Food Insecurity Interventions Intervention Not Indicated  Housing Interventions Intervention Not Indicated  Transportation Interventions Intervention Not Indicated  Utilities Interventions Intervention Not Indicated  Alcohol Usage Interventions Intervention Not Indicated (Score <7)  Depression Interventions/Treatment  Medication, Counseling, Currently on Treatment  Financial Strain Interventions Intervention Not Indicated  Physical Activity Interventions Intervention Not Indicated  Stress Interventions Intervention Not Indicated, Offered [employer], Provide Counseling  Social Connections Interventions Intervention Not Indicated  Health Literacy Interventions Intervention Not Indicated     Care Coordination Interventions:  Yes, provided.   Follow up plan: Follow up call scheduled for 07/12/2023 at 2:15 pm.  Encounter Outcome:  Patient Visit Completed.    Danford Bad, BSW, MSW, Printmaker Social Work Case Set designer Health  [facility], Population Health Direct Dial: [phone]  Fax: [phone] Email: [email or web address]: [email or web address]

## 2023-06-25 NOTE — Patient Instructions (Signed)
Visit Information  Thank you for taking time to visit with me today. Please don't hesitate to contact me if I can be of assistance to you.   Following are the goals we discussed today:   Goals Addressed               This Visit's Progress     Receive Counseling & Supportive Services for Symptoms of Anxiety. (pt-stated)   On track     Care Coordination Interventions:  Interventions Today    Flowsheet Row Most Recent Value  Chronic Disease   Chronic disease during today's visit Atrial Fibrillation (AFib), Hypertension (HTN), Other  [Diffuse Pain, Recurrent Depression, History of Falls & Fall Risk, Dizzy Spells, Fibromyalgia, Sleep Apnea, Dysphagia, Prediabetes, Obesity, Post Nasal Drip, Nausea]  General Interventions   General Interventions Discussed/Reviewed General Interventions Discussed, General Interventions Reviewed, Communication with, Doctor Visits, [facility], Level of Care  [Communication with Care Team Members]  Doctor Visits Discussed/Reviewed Doctor Visits Discussed, Doctor Visits Reviewed, Annual Wellness Visits, PCP, Specialist  [Encouraged Routine Engagement]  PCP/Specialist Visits Compliance with follow-up visit  [Encouraged Routine Engagement]  Communication with PCP/Specialists, Charity fundraiser, Pharmacists, Social Work  [employer] Routine Engagement]  Level of Care Adult Daycare, Air traffic controller, Assisted Living, Personal Care Services  [Confirmed Disinterest in Enrollment in Adult Day Care Program, Applying for Personal Care Services, or Pursuing Higher Level of Care Placement Options]  Applications Medicaid, Personal Care Services  [Confirmed Disinterest in Applying for Medicaid or Personal Care Services]  Education Interventions   Education Provided Provided Education  Provided Verbal Education On When to see the doctor, [facility], Mental Health/Coping with Illness, Development worker, community, Production assistant, radio, Personal Care Services  [Confirmed  Disinterest in Applying for Medicaid or Personal Care Services]  Mental Health Interventions   Mental Health Discussed/Reviewed Mental Health Discussed, Grief and Loss, Substance Abuse, Mental Health Reviewed, Suicide, Coping Strategies, Crisis, Anxiety, Depression  [Assessed Mental Health Status]  Safety Interventions   Safety Discussed/Reviewed Safety Discussed, Safety Reviewed, Fall Risk, Home Safety  [Encouraged Consideration of Home Safety Evaluation]  Home Safety Assistive Devices, Need for home safety assessment  [Encouraged Routine Use of Assistive Devices]  Advanced Directive Interventions   Advanced Directives Discussed/Reviewed Advanced Directives Discussed, Advanced Directives Reviewed  [Encouraged Initiation of Advanced Directives, Offering to [employer] & Assist with Completion]      Active Listening & Reflection Utilized.  Verbalization of Feelings Encouraged.  Emotional Support Provided. Acceptance & Commitment Therapy Performed. Cognitive Behavioral Therapy Conducted. Client-Centered Therapy Initiated. CSW Collaboration with Rica Records, Nurse Practitioner & Primary Care Provider with [facility] [phone]), Via Routed Note in Epic, to Request Prescription for Psychotropic Medication for Management & Treatment of Depression & Anxiety Symptoms. CSW Collaboration with Rica Records, Nurse Practitioner & Primary Care Provider with [facility] Care [phone]), Via Routed Note in Epic, to Report Inability to Administer Prescription for Psychotropic Medication, Zoloft for Management & Treatment of Depression & Anxiety Symptoms, Due to Severity of Side Effects, Per Your Request.  CSW Collaboration with Rica Records, Nurse Practitioner & Primary Care Provider with [facility] [phone]), Via Routed Note in Epic, to Request Consideration of Prescribing New Psychotropic  Medication for Management & Treatment of Depression & Anxiety Symptoms, Per Your Request. CSW Collaboration with Dr. Charlton Haws, Cardiologist with [facility] (# [phone]), Via Routed Note in Epic, to Report the Following Symptoms,  Per Your Request:  Elevated Blood Pressure Chest Pain Heart Palpitations Heart Flutters Fatigue  Shortness of Breath Encouraged Routine Engagement with Danford Bad, Licensed Clinical Social Worker with [facility] [phone]), if You Have Questions, Need Assistance, or If Additional Social Work Needs Are Identified Between Now & Our Next Follow-Up Outreach Call, Scheduled on 07/12/2023 at 2:15 PM.      Our next appointment is by telephone on 07/12/2023 at 2:15 pm.  Please call the care guide team at [phone] if you need to cancel or reschedule your appointment.   If you are experiencing a Mental Health or Behavioral Health Crisis or need someone to talk to, please call the Suicide and Crisis Lifeline: 988 call the [place] National Suicide Prevention Lifeline: 437-527-7816 or TTY: 7248526650 TTY 270-830-8885) to talk to a trained counselor call 1-800-273-TALK (toll free, 24 hour hotline) go to [facility] [address] [place] [phone]) call the [facility] Crisis Line: (540)307-9277 call 911  Patient verbalizes understanding of instructions and care plan provided today and agrees to view in MyChart. Active MyChart status and patient understanding of how to access instructions and care plan via MyChart confirmed with patient.     Telephone follow up appointment with care management team member scheduled for:   07/12/2023 at 2:15 pm.  Danford Bad, BSW, MSW, LCSW  Embedded Practice Social Work Case Manager  [facility], Population Health Direct Dial: [phone]  Fax: [phone] Email:  [email or web address]: [email or web address]

## 2023-06-26 NOTE — Patient Instructions (Signed)
        Great to see you today.  I have refilled the medication(s) we provide.    - Please take medications as prescribed. - Follow up with your primary health provider if any health concerns arises. - If symptoms worsen please contact your primary care provider and/or visit the emergency department.  

## 2023-06-26 NOTE — Progress Notes (Unsigned)
   Established Patient Office Visit   Subjective  Patient ID: Steven [name], male    DOB: 02-07-1951  Age: 72 y.o. MRN: [id number]  No chief complaint on file.   He  has a past medical history of Anxiety, Arthritis, Atrial fibrillation (HCC), Atrial flutter (HCC), BPH (benign prostatic hyperplasia), Chest pain, Dizziness, Dyspnea, Fracture (08/17/2015), GERD (gastroesophageal reflux disease), Hemorrhoids, History of kidney stones, History of radiation therapy (08/22/11-10/13/11), Hyperlipemia, Hypertension, Hyperthyroidism, IBS (irritable bowel syndrome), Insomnia, Light headedness, Migraine, Numbness and tingling in left arm, Numbness and tingling of both legs, Open fracture of left elbow (08/18/2015), Prostate cancer (HCC), Rib fractures (08/17/2015), and Tinnitus.  Rash This is a new (2 weeks ago) problem. The problem has been gradually worsening since onset. The affected locations include the face. The rash is characterized by blistering, redness and itchiness. It is unknown if there was an exposure to a precipitant. Pertinent negatives include no cough, facial edema, shortness of breath or sore throat. Past treatments include antihistamine and moisturizer. The treatment provided mild relief. There is no history of allergies.    Review of Systems  HENT:  Negative for sore throat.   Respiratory:  Negative for cough and shortness of breath.   Skin:  Positive for rash.      Objective:     There were no vitals taken for this visit. BP Readings from Last 3 Encounters:  06/27/23 (!) 164/80  06/18/23 131/81  06/14/23 126/70      Physical Exam   No results found for any visits on 06/27/23.  The 10-year ASCVD risk score (Arnett DK, et al., 2019) is: 18.9%    Assessment & Plan:  There are no diagnoses linked to this encounter.  No follow-ups on file.   Cruzita Lederer Newman Nip, FNP

## 2023-06-27 ENCOUNTER — Ambulatory Visit: Payer: Medicare PPO | Admitting: Family Medicine

## 2023-06-27 ENCOUNTER — Telehealth (INDEPENDENT_AMBULATORY_CARE_PROVIDER_SITE_OTHER): Payer: Medicare PPO | Admitting: Professional Counselor

## 2023-06-27 ENCOUNTER — Encounter: Payer: Self-pay | Admitting: Family Medicine

## 2023-06-27 VITALS — BP 164/80 | HR 57 | Ht 67.0 in | Wt 209.0 lb

## 2023-06-27 DIAGNOSIS — F419 Anxiety disorder, unspecified: Secondary | ICD-10-CM | POA: Diagnosis not present

## 2023-06-27 DIAGNOSIS — F32A Depression, unspecified: Secondary | ICD-10-CM

## 2023-06-27 DIAGNOSIS — M545 Low back pain, unspecified: Secondary | ICD-10-CM | POA: Diagnosis not present

## 2023-06-27 DIAGNOSIS — L249 Irritant contact dermatitis, unspecified cause: Secondary | ICD-10-CM | POA: Diagnosis not present

## 2023-06-27 DIAGNOSIS — L259 Unspecified contact dermatitis, unspecified cause: Secondary | ICD-10-CM | POA: Insufficient documentation

## 2023-06-27 DIAGNOSIS — F330 Major depressive disorder, recurrent, mild: Secondary | ICD-10-CM

## 2023-06-27 MED ORDER — CEPHALEXIN 500 MG PO CAPS: 500.0000 mg | ORAL_CAPSULE | Freq: Two times a day (BID) | ORAL | 0 refills | Status: AC

## 2023-06-27 MED ORDER — TRIAMCINOLONE ACETONIDE 0.1 % EX CREA: 1.0000 | TOPICAL_CREAM | Freq: Two times a day (BID) | CUTANEOUS | 0 refills | Status: AC

## 2023-06-27 MED ORDER — BUSPIRONE HCL 15 MG PO TABS: 15.0000 mg | ORAL_TABLET | Freq: Two times a day (BID) | ORAL | 2 refills | Status: DC

## 2023-06-27 NOTE — Assessment & Plan Note (Signed)
Reviewed Xray results with patient Patient agreed to follow up with spine specialist for worsening symptoms Referral placed to physical therapy We discussed the desired effects and potential side effects of the prescribed medication for back pain. Additionally, we reviewed non-pharmacological interventions, including the importance of rest, avoiding twisting, improper bending, and straining the lower back. I demonstrated proper body mechanics to prevent further injury and advised alternating between ice and heat therapy for relief. Stretching exercises for both the back and legs were recommended to improve flexibility and support recovery. The patient was advised to follow up if symptoms worsen or persist. The patient expressed understanding of the treatment plan, and all questions were thoroughly addressed.

## 2023-06-27 NOTE — BH Specialist Note (Addendum)
Virtual Behavioral Health Treatment Plan Team Note  MRN: [id number] NAME: Steven [name]  DATE: 06/27/23  Start time: Start Time: 0135 End time: Stop Time: 0145 Total time: Total Time in Minutes (Visit): 10  Total number of Virtual BH Treatment Team Plan encounters: 1/4  Treatment Team Attendees: Dr. Lolly Mustache and Esmond Harps  Diagnoses:    ICD-10-CM   1. MDD (major depressive disorder), recurrent episode, mild (HCC)  F33.0       Goals, Interventions and Follow-up Plan Goals: Increase healthy adjustment to current life circumstances Interventions: Behavioral Activation Supportive Counseling Medication Management Recommendations: NA Follow-up Plan: Bi-weekly CBT and behavior activation therapy  History of the present illness Presenting Problem/Current Symptoms:  The patient is a 72 year old male with a history of depression and anxiety who presents with difficulties related to aging and adjusting to a recent diagnosis of atrial fibrillation (AFib). He reports feeling low energy, being less active, and having little desire to go out. He describes having panic attacks but is uncertain about their triggers. His AFib diagnosis, along with a diagnosis of IBS and thyroid issues in 2020, has significantly impacted him, and he has struggled to adapt to these health challenges.   The patient shares that his condition requires frequent visits to specialists and extensive testing, which has left him feeling physically and emotionally worn down. Additionally, he is experiencing financial stress due to mounting medical bills. These factors have contributed to his overall difficulty in managing day-to-day life. He spends much of his time alone, as his spouse, though supportive, works long hours.   He denies any psychiatric history, trauma, or substance abuse. His primary goal is to receive supportive counseling to help him cope with the aging process, his health concerns, and associated stressors. A  psychiatric consultation is planned to evaluate his needs and develop an appropriate treatment plan. The focus will be on providing tools and support to help him navigate these changes and improve his overall well-being.  Screenings PHQ-9 Assessments:     06/25/2023    9:55 AM 06/18/2023    3:11 PM 06/15/2023    1:34 PM  Depression screen PHQ 2/9  Decreased Interest 1 1 1   Down, Depressed, Hopeless 0 0   PHQ - 2 Score 1 1 1   Altered sleeping 1 1 1   Tired, decreased energy 1 1 1   Change in appetite 0 0 1  Feeling bad or failure about yourself  0 0 1  Trouble concentrating 0 0   Moving slowly or fidgety/restless 0 0   Suicidal thoughts 0 0   PHQ-9 Score 3 3 5   Difficult doing work/chores Somewhat difficult Somewhat difficult    GAD-7 Assessments:     06/18/2023    3:11 PM 05/11/2023    8:06 AM 04/19/2023   11:09 AM 04/06/2023   10:48 AM  GAD 7 : Generalized Anxiety Score  Nervous, Anxious, on Edge 1 0 0 0  Control/stop worrying 1 0 0 0  Worry too much - different things 1 0 0 0  Trouble relaxing 0 0 0 0  Restless 0 0 0 0  Easily annoyed or irritable 1 0 0 0  Afraid - awful might happen 1 0 0 0  Total GAD 7 Score 5 0 0 0  Anxiety Difficulty Somewhat difficult Not difficult at all Not difficult at all Not difficult at all    Past Medical History Past Medical History:  Diagnosis Date   Anxiety    Arthritis  Atrial fibrillation (HCC)    Atrial flutter (HCC)    BPH (benign prostatic hyperplasia)    Chest pain    Dizziness    Dyspnea    laying down occ   Fracture 08/17/2015   MULTIPLE RIB FRACTURES     FROM FALL    GERD (gastroesophageal reflux disease)    Hemorrhoids    History of kidney stones    noted on CT scan   History of radiation therapy 08/22/11-10/13/11   prostate   Hyperlipemia    Hypertension    Hyperthyroidism    IBS (irritable bowel syndrome)    Insomnia    Light headedness    Migraine    Numbness and tingling in left arm    Numbness and  tingling of both legs    Open fracture of left elbow 08/18/2015   Prostate cancer [facility])    prostate s/p radiation Mar 2013   Rib fractures 08/17/2015   Tinnitus     Vital signs: There were no vitals filed for this visit.  Allergies:  Allergies as of 06/27/2023 - Review Complete 06/27/2023  Allergen Reaction Noted   Latex Rash and Other (See Comments) 03/25/2020   Meclizine Other (See Comments) 09/29/2021    Medication History Current medications:  Outpatient Encounter Medications as of 06/27/2023  Medication Sig   acetaminophen (TYLENOL) 325 MG tablet Take 325 mg by mouth every 6 (six) hours as needed.   alfuzosin (UROXATRAL) 10 MG 24 hr tablet Take 1 tablet (10 mg total) by mouth daily with breakfast.   amLODipine (NORVASC) 10 MG tablet Take 1 tablet (10 mg total) by mouth daily.   amLODipine (NORVASC) 5 MG tablet Take 5 mg by mouth daily.   apixaban (ELIQUIS) 5 MG TABS tablet Take 1 tablet (5 mg total) by mouth 2 (two) times daily.   apixaban (ELIQUIS) 5 MG TABS tablet Take 1 tablet (5 mg total) by mouth 2 (two) times daily.   Azelastine-Fluticasone 137-50 MCG/ACT SUSP Place 1 spray into the nose every 12 (twelve) hours.   benzonatate (TESSALON) 200 MG capsule Take 1 capsule (200 mg total) by mouth 2 (two) times daily as needed for cough.   busPIRone (BUSPAR) 15 MG tablet Take 1 tablet (15 mg total) by mouth 2 (two) times daily.   cephALEXin (KEFLEX) 500 MG capsule Take 1 capsule (500 mg total) by mouth 2 (two) times daily for 5 days.   Cholecalciferol (VITAMIN D3) 125 MCG (5000 UT) CAPS Take 1 capsule by mouth daily.    cloNIDine (CATAPRES - DOSED IN MG/24 HR) 0.1 mg/24hr patch Place 0.1 mg onto the skin once a week.   cloNIDine (CATAPRES) 0.1 MG tablet Take 1 tablet (0.1 mg total) by mouth 2 (two) times daily as needed (SBP>160).   ezetimibe (ZETIA) 10 MG tablet Take 1 tablet (10 mg total) by mouth daily.   furosemide (LASIX) 20 MG tablet Take 1 tablet (20 mg total) by mouth  daily. Take with or after meal.   furosemide (LASIX) 20 MG tablet Take 20 mg by mouth daily.   gabapentin (NEURONTIN) 300 MG capsule Take 300 mg by mouth 2 (two) times daily.   Galcanezumab-gnlm (EMGALITY) 120 MG/ML SOAJ Inject 1 Pen into the skin every 30 (thirty) days.   hydrOXYzine (ATARAX) 10 MG tablet Take 1 tablet (10 mg total) by mouth 3 (three) times daily as needed for anxiety.   hydrOXYzine (ATARAX) 10 MG tablet Take 10 mg by mouth 3 (three) times daily as needed.  hyoscyamine (LEVBID) 0.375 MG 12 hr tablet Take 0.375 mg by mouth 2 (two) times daily.   loratadine (CLARITIN) 10 MG tablet Take 1 tablet (10 mg total) by mouth daily.   loratadine (CLARITIN) 10 MG tablet Take 10 mg by mouth daily.   losartan (COZAAR) 100 MG tablet Take 100 mg by mouth daily.   losartan (COZAAR) 100 MG tablet Take 50 mg by mouth daily.   methimazole (TAPAZOLE) 5 MG tablet Take 0.5 tablets (2.5 mg total) by mouth daily.   metoprolol tartrate (LOPRESSOR) 25 MG tablet Take 1 tablet (25 mg total) by mouth 2 (two) times daily. TAKE EXTRA HALF TABLET  AS NEEDED   metoprolol tartrate (LOPRESSOR) 50 MG tablet Take 50 mg by mouth 2 (two) times daily.   mirtazapine (REMERON) 15 MG tablet Take 1 tablet (15 mg total) by mouth at bedtime.   NURTEC 75 MG TBDP Take 75 mg by mouth daily as needed (pain).   omeprazole (PRILOSEC) 20 MG capsule Take 20 mg by mouth daily.   ondansetron (ZOFRAN) 4 MG tablet Take 1 tablet (4 mg total) by mouth every 8 (eight) hours as needed for nausea or vomiting.   polyethylene glycol (MIRALAX / GLYCOLAX) 17 g packet Take 17 g by mouth daily.   polyethylene glycol (MIRALAX) 17 g packet Take 17 g by mouth once.   potassium chloride (KLOR-CON) 20 MEQ packet Take 20 mEq by mouth once.   potassium chloride SA (KLOR-CON M) 20 MEQ tablet Take 1 tablet (20 mEq total) by mouth daily.   pregabalin (LYRICA) 100 MG capsule Take 1 capsule (100 mg total) by mouth 2 (two) times daily.   Probiotic Product  (PROBIOTIC-10 PO) Take 1 capsule by mouth daily.   saccharomyces boulardii (FLORASTOR) 250 MG capsule Take 250 mg by mouth 2 (two) times daily.   tiZANidine (ZANAFLEX) 4 MG tablet Take 1 tablet (4 mg total) by mouth every 6 (six) hours as needed for muscle spasms.   triamcinolone cream (KENALOG) 0.1 % Apply 1 Application topically 2 (two) times daily.   Ubrogepant (UBRELVY) 100 MG TABS Take 1 tablet (100 mg total) by mouth as needed (for headaches). May repeat a dose in 2 hours if needed. Max dose 2 pills in 24 hours   vitamin B-12 (CYANOCOBALAMIN) 100 MCG tablet Take 100 mcg by mouth daily.   [DISCONTINUED] gabapentin (NEURONTIN) 300 MG capsule Take 1 capsule (300 mg total) by mouth 3 (three) times daily as needed.   No facility-administered encounter medications on file as of 06/27/2023.     Scribe for Treatment Team: Reuel Boom

## 2023-06-27 NOTE — Progress Notes (Signed)
Established Patient Office Visit   Subjective  Patient ID: Steven [name], male    DOB: 1951/04/29  Age: 72 y.o. MRN: [id number]  Chief Complaint  Patient presents with   Follow-up   Medical Management of Chronic Issues    Discuss anxiety and blood pressure    He  has a past medical history of Anxiety, Arthritis, Atrial fibrillation (HCC), Atrial flutter (HCC), BPH (benign prostatic hyperplasia), Chest pain, Dizziness, Dyspnea, Fracture (08/17/2015), GERD (gastroesophageal reflux disease), Hemorrhoids, History of kidney stones, History of radiation therapy (08/22/11-10/13/11), Hyperlipemia, Hypertension, Hyperthyroidism, IBS (irritable bowel syndrome), Insomnia, Light headedness, Migraine, Numbness and tingling in left arm, Numbness and tingling of both legs, Open fracture of left elbow (08/18/2015), Prostate cancer (HCC), Rib fractures (08/17/2015), and Tinnitus.  Patient presents with a two-week history of a gradually worsening rash localized to the face, characterized by blistering, redness, and itchiness. The patient is unsure of any specific exposure to a precipitating factor and reports associated joint pain. Patient deny symptoms such as cough, facial edema, or sore throat. Previous treatments with antihistamines and moisturizers provided only mild relief. There is no history of allergies. The presentation is consistent with infected contact dermatitis.    Review of Systems  Constitutional:  Negative for chills and fever.  Eyes:  Negative for blurred vision.  Cardiovascular:  Negative for palpitations and leg swelling.  Musculoskeletal:  Positive for back pain, joint pain and myalgias.  Skin:  Positive for itching and rash.  Neurological:  Negative for dizziness.  Psychiatric/Behavioral:  Positive for depression. The patient is nervous/anxious.       Objective:     BP (!) 164/80   Pulse (!) 57   Ht 5\' 7"  (1.702 m)   Wt 209 lb 0.6 oz (94.8 kg)   SpO2 96%   BMI 32.74 kg/m   BP Readings from Last 3 Encounters:  06/27/23 (!) 164/80  06/18/23 131/81  06/14/23 126/70      Physical Exam Vitals reviewed.  Constitutional:      General: He is not in acute distress.    Appearance: Normal appearance. He is not ill-appearing, toxic-appearing or diaphoretic.  HENT:     Head: Normocephalic.  Eyes:     General:        Right eye: No discharge.        Left eye: No discharge.     Conjunctiva/sclera: Conjunctivae normal.  Cardiovascular:     Rate and Rhythm: Normal rate.     Pulses: Normal pulses.     Heart sounds: Normal heart sounds.  Pulmonary:     Effort: Pulmonary effort is normal. No respiratory distress.     Breath sounds: Normal breath sounds.  Musculoskeletal:     Lumbar back: Tenderness present. Decreased range of motion.     Right hip: Decreased range of motion. Decreased strength.     Left hip: Decreased range of motion. Decreased strength.  Skin:    General: Skin is warm and dry.     Capillary Refill: Capillary refill takes less than 2 seconds.     Comments: Bilateral facial rash consists of small, fluid-filled papules that are erythematous and tender   Neurological:     Mental Status: He is alert.  Psychiatric:        Mood and Affect: Mood normal.        Behavior: Behavior normal.      No results found for any visits on 06/27/23.  The 10-year ASCVD risk score (Arnett DK, et  al., 2019) is: 27.5%    Assessment & Plan:  Irritant contact dermatitis, unspecified trigger Assessment & Plan: Signs of secondary infection, including increased redness and fluid filled papules surround the bilateral cheek area. Started Cephalexin 500 mg twice daily for 5 days to address the suspected bacterial infection. Patient advised to monitor for worsening symptoms, including fever or spreading redness, and to follow up if no improvement. Advise patient to apply cold , wet cloth or ice pack to the skin that itches, use fragrance-free lotions, soaps, and  detergents to minimize irritation.  Kenalog PRN for itch relief   Orders: -     Triamcinolone Acetonide; Apply 1 Application topically 2 (two) times daily.  Dispense: 30 g; Refill: 0 -     Cephalexin; Take 1 capsule (500 mg total) by mouth 2 (two) times daily for 5 days.  Dispense: 10 capsule; Refill: 0  Anxiety and depression Assessment & Plan: Not controlled Initiated Buspar 15 mg twice daily along with Remeron 15 mg at bedtime Advise continued close follow up with Arlys John for therapy  Continued discussion on lifestyle changes, establishing a daily routine, going outdoors, exercise, healthy eating habits, mindfulness and mediatation.     Orders: -     busPIRone HCl; Take 1 tablet (15 mg total) by mouth 2 (two) times daily.  Dispense: 60 tablet; Refill: 2  Lumbar pain Assessment & Plan: Reviewed Xray results with patient Patient agreed to follow up with spine specialist for worsening symptoms Referral placed to physical therapy We discussed the desired effects and potential side effects of the prescribed medication for back pain. Additionally, we reviewed non-pharmacological interventions, including the importance of rest, avoiding twisting, improper bending, and straining the lower back. I demonstrated proper body mechanics to prevent further injury and advised alternating between ice and heat therapy for relief. Stretching exercises for both the back and legs were recommended to improve flexibility and support recovery. The patient was advised to follow up if symptoms worsen or persist. The patient expressed understanding of the treatment plan, and all questions were thoroughly addressed.   Orders: -     Ambulatory referral to Physical Therapy    Return if symptoms worsen or fail to improve.   Cruzita Lederer Newman Nip, FNP

## 2023-06-27 NOTE — Assessment & Plan Note (Addendum)
Signs of secondary infection, including increased redness and fluid filled papules surround the bilateral cheek area. Started Cephalexin 500 mg twice daily for 5 days to address the suspected bacterial infection. Patient advised to monitor for worsening symptoms, including fever or spreading redness, and to follow up if no improvement. Advise patient to apply cold , wet cloth or ice pack to the skin that itches, use fragrance-free lotions, soaps, and detergents to minimize irritation.  Kenalog PRN for itch relief

## 2023-06-27 NOTE — Assessment & Plan Note (Addendum)
Not controlled Initiated Buspar 15 mg twice daily along with Remeron 15 mg at bedtime Advise continued close follow up with Arlys John for therapy  Continued discussion on lifestyle changes, establishing a daily routine, going outdoors, exercise, healthy eating habits, mindfulness and mediatation.

## 2023-06-28 ENCOUNTER — Ambulatory Visit: Payer: Medicare PPO | Admitting: Professional Counselor

## 2023-06-28 DIAGNOSIS — F330 Major depressive disorder, recurrent, mild: Secondary | ICD-10-CM

## 2023-06-28 NOTE — BH Specialist Note (Signed)
[facility] Telephone Follow-up  MRN: [id number] NAME: Steven [name] Date: 06/28/23  Start time: Start Time: 1030 End time: Stop Time: 1100 Total time: Total Time in Minutes (Visit): 30 Call number: Visit Number: 3- Third Visit  Reason for call today:  The patient is a 72 year old male presenting for a collaborative care follow-up session conducted virtually. Despite ongoing pain and depressive episodes tied to his medical conditions, the patient appeared in a relatively positive mood. He has atrial fibrillation (a-fib) and scoliosis, resulting in intermittent pain in his feet, legs, and hands, which varies between dull and sharp. He also experiences anxiety related to a-fib, particularly when he notices chest pain. These physical symptoms, coupled with limitations in his activity level, have contributed to his depression and anxiety.  During the session, we discussed strategies to manage negative thoughts and fears, particularly those associated with his health concerns. Behavioral activation was introduced to help the patient re-engage in meaningful activities that accommodate his physical limitations. His depressive episodes are believed to be linked to the adjustment process of no longer being able to enjoy previously pleasurable activities, such as working on his cars or spending time in the yard, due to fatigue and concerns about his heart rate. Together, we brainstormed alternative hobbies and interests that require less physical exertion. Additionally, supportive counseling was provided to allow him to process his feelings and emotions related to his symptoms and lifestyle changes.  The patient recently started a trial of buspirone (BuSpar) for anxiety and is awaiting results. He also has several upcoming medical appointments, including with a spine specialist, to explore treatment options such as injections to alleviate his back pain. Overall, he is navigating a challenging period  but remains engaged in addressing his mental health and quality of life. We will continue meeting biweekly, focusing on behavioral activation and cognitive-behavioral therapy (CBT) to support him in coping with his symptoms and finding new sources of fulfillment.  PHQ-9 Scores:     06/25/2023    9:55 AM 06/18/2023    3:11 PM 06/15/2023    1:34 PM 05/11/2023    8:06 AM 04/19/2023   11:08 AM  Depression screen PHQ 2/9  Decreased Interest 1 1 1  0 0  Down, Depressed, Hopeless 0 0  0 0  PHQ - 2 Score 1 1 1  0 0  Altered sleeping 1 1 1  0 0  Tired, decreased energy 1 1 1  0 0  Change in appetite 0 0 1 0 0  Feeling bad or failure about yourself  0 0 1 0 0  Trouble concentrating 0 0  0 0  Moving slowly or fidgety/restless 0 0  0 0  Suicidal thoughts 0 0  0 0  PHQ-9 Score 3 3 5  0 0  Difficult doing work/chores Somewhat difficult Somewhat difficult  Not difficult at all Not difficult at all   GAD-7 Scores:     06/18/2023    3:11 PM 05/11/2023    8:06 AM 04/19/2023   11:09 AM 04/06/2023   10:48 AM  GAD 7 : Generalized Anxiety Score  Nervous, Anxious, on Edge 1 0 0 0  Control/stop worrying 1 0 0 0  Worry too much - different things 1 0 0 0  Trouble relaxing 0 0 0 0  Restless 0 0 0 0  Easily annoyed or irritable 1 0 0 0  Afraid - awful might happen 1 0 0 0  Total GAD 7 Score 5 0 0 0  Anxiety Difficulty  Somewhat difficult Not difficult at all Not difficult at all Not difficult at all    Stress Current stressors:  Medical conditions, pain Sleep:  Fair Appetite:  Good Coping ability:  Good Patient taking medications as prescribed:  Yes  Current medications:  Outpatient Encounter Medications as of 06/28/2023  Medication Sig   acetaminophen (TYLENOL) 325 MG tablet Take 325 mg by mouth every 6 (six) hours as needed.   alfuzosin (UROXATRAL) 10 MG 24 hr tablet Take 1 tablet (10 mg total) by mouth daily with breakfast.   amLODipine (NORVASC) 10 MG tablet Take 1 tablet (10 mg total) by mouth  daily.   amLODipine (NORVASC) 5 MG tablet Take 5 mg by mouth daily.   apixaban (ELIQUIS) 5 MG TABS tablet Take 1 tablet (5 mg total) by mouth 2 (two) times daily.   apixaban (ELIQUIS) 5 MG TABS tablet Take 1 tablet (5 mg total) by mouth 2 (two) times daily.   Azelastine-Fluticasone 137-50 MCG/ACT SUSP Place 1 spray into the nose every 12 (twelve) hours.   benzonatate (TESSALON) 200 MG capsule Take 1 capsule (200 mg total) by mouth 2 (two) times daily as needed for cough.   busPIRone (BUSPAR) 15 MG tablet Take 1 tablet (15 mg total) by mouth 2 (two) times daily.   cephALEXin (KEFLEX) 500 MG capsule Take 1 capsule (500 mg total) by mouth 2 (two) times daily for 5 days.   Cholecalciferol (VITAMIN D3) 125 MCG (5000 UT) CAPS Take 1 capsule by mouth daily.    cloNIDine (CATAPRES - DOSED IN MG/24 HR) 0.1 mg/24hr patch Place 0.1 mg onto the skin once a week.   cloNIDine (CATAPRES) 0.1 MG tablet Take 1 tablet (0.1 mg total) by mouth 2 (two) times daily as needed (SBP>160).   ezetimibe (ZETIA) 10 MG tablet Take 1 tablet (10 mg total) by mouth daily.   furosemide (LASIX) 20 MG tablet Take 1 tablet (20 mg total) by mouth daily. Take with or after meal.   furosemide (LASIX) 20 MG tablet Take 20 mg by mouth daily.   gabapentin (NEURONTIN) 300 MG capsule Take 300 mg by mouth 2 (two) times daily.   Galcanezumab-gnlm (EMGALITY) 120 MG/ML SOAJ Inject 1 Pen into the skin every 30 (thirty) days.   hydrOXYzine (ATARAX) 10 MG tablet Take 1 tablet (10 mg total) by mouth 3 (three) times daily as needed for anxiety.   hydrOXYzine (ATARAX) 10 MG tablet Take 10 mg by mouth 3 (three) times daily as needed.   hyoscyamine (LEVBID) 0.375 MG 12 hr tablet Take 0.375 mg by mouth 2 (two) times daily.   loratadine (CLARITIN) 10 MG tablet Take 1 tablet (10 mg total) by mouth daily.   loratadine (CLARITIN) 10 MG tablet Take 10 mg by mouth daily.   losartan (COZAAR) 100 MG tablet Take 100 mg by mouth daily.   losartan (COZAAR) 100  MG tablet Take 50 mg by mouth daily.   methimazole (TAPAZOLE) 5 MG tablet Take 0.5 tablets (2.5 mg total) by mouth daily.   metoprolol tartrate (LOPRESSOR) 25 MG tablet Take 1 tablet (25 mg total) by mouth 2 (two) times daily. TAKE EXTRA HALF TABLET  AS NEEDED   metoprolol tartrate (LOPRESSOR) 50 MG tablet Take 50 mg by mouth 2 (two) times daily.   mirtazapine (REMERON) 15 MG tablet Take 1 tablet (15 mg total) by mouth at bedtime.   NURTEC 75 MG TBDP Take 75 mg by mouth daily as needed (pain).   omeprazole (PRILOSEC) 20 MG capsule Take  20 mg by mouth daily.   ondansetron (ZOFRAN) 4 MG tablet Take 1 tablet (4 mg total) by mouth every 8 (eight) hours as needed for nausea or vomiting.   polyethylene glycol (MIRALAX / GLYCOLAX) 17 g packet Take 17 g by mouth daily.   polyethylene glycol (MIRALAX) 17 g packet Take 17 g by mouth once.   potassium chloride (KLOR-CON) 20 MEQ packet Take 20 mEq by mouth once.   potassium chloride SA (KLOR-CON M) 20 MEQ tablet Take 1 tablet (20 mEq total) by mouth daily.   pregabalin (LYRICA) 100 MG capsule Take 1 capsule (100 mg total) by mouth 2 (two) times daily.   Probiotic Product (PROBIOTIC-10 PO) Take 1 capsule by mouth daily.   saccharomyces boulardii (FLORASTOR) 250 MG capsule Take 250 mg by mouth 2 (two) times daily.   tiZANidine (ZANAFLEX) 4 MG tablet Take 1 tablet (4 mg total) by mouth every 6 (six) hours as needed for muscle spasms.   triamcinolone cream (KENALOG) 0.1 % Apply 1 Application topically 2 (two) times daily.   Ubrogepant (UBRELVY) 100 MG TABS Take 1 tablet (100 mg total) by mouth as needed (for headaches). May repeat a dose in 2 hours if needed. Max dose 2 pills in 24 hours   vitamin B-12 (CYANOCOBALAMIN) 100 MCG tablet Take 100 mcg by mouth daily.   [DISCONTINUED] gabapentin (NEURONTIN) 300 MG capsule Take 1 capsule (300 mg total) by mouth 3 (three) times daily as needed.   No facility-administered encounter medications on file as of 06/28/2023.      Self-harm Behaviors Risk Assessment Self-harm risk factors:  Medical conditions, depression, aging Patient endorses recent thoughts of harming self:  Denies   Danger to Others Risk Assessment Danger to others risk factors:  None Patient endorses recent thoughts of harming others:  Denies   Substance Use Assessment Patient recently consumed alcohol:  Denies  Alcohol Use Disorder Identification Test (AUDIT):     11/03/2020   10:37 AM 01/08/2023    6:25 PM 03/30/2023    8:46 AM 06/25/2023    9:55 AM  Alcohol Use Disorder Test (AUDIT)  1. How often do you have a drink containing alcohol? 0 0 0 1  2. How many drinks containing alcohol do you have on a typical day when you are drinking? 0 0 0 0  3. How often do you have six or more drinks on one occasion? 0 0 0 0  AUDIT-C Score 0 0 0 1    Goals, Interventions and Follow-up Plan Goals:  Increase pleasurable interests and decrease isolation and negative thought patterns Interventions: Behavioral Activation and CBT Cognitive Behavioral Therapy Follow-up Plan:  Bi weekly cbt and behavior activation    Reuel Boom

## 2023-06-29 DIAGNOSIS — Z6834 Body mass index (BMI) 34.0-34.9, adult: Secondary | ICD-10-CM | POA: Diagnosis not present

## 2023-06-29 DIAGNOSIS — M47812 Spondylosis without myelopathy or radiculopathy, cervical region: Secondary | ICD-10-CM | POA: Diagnosis not present

## 2023-07-01 ENCOUNTER — Other Ambulatory Visit: Payer: Self-pay | Admitting: Family Medicine

## 2023-07-09 ENCOUNTER — Ambulatory Visit: Payer: Self-pay | Admitting: *Deleted

## 2023-07-09 NOTE — Patient Outreach (Signed)
  Care Coordination   Follow Up Visit Note   07/09/2023 Name: Steven [name] MRN: [id number] DOB: 06-Jul-1951  HULBERT HUCKEBY is a 72 y.o. year old male who sees Del Newman Nip, [place], FNP for primary care. I spoke with  [name] by phone today.  What matters to the patients health and wellness today?  Pain (lower back) mainly at night from 7 pm-10 pm  - describe as " like a stinging, stabbing, like a shock, get a weird feeling " site varies, low back, sometimes radiating from his back to his side.  X ray showed per patient what he already is aware of, his scoliosis.  A referral ordered for outpatient therapy on 06/27/23 but it is noted also on 06/27/23 the "Patient states he's gone talk to his neurologist and give it some thought. He confirms he was informed the referral staff would wait to hear from him before proceeding. He states he has returned a call after seeing his neurologist, and informed staff, he would like to proceed with services which "will have to get approval"  He discussed possible home health therapy. He is not home bound . He confirms he has not had home health nor outpatient therapy in the last 30 days  He mentioned one of his medical providers offered injections but he feels they are no longer effective He agreed for RN CM to check the status of his referral to therapy      Goals Addressed             This Visit's Progress    Manage Blood Pressure & Heart Rate/Rhythm, post nasal drainage, back pain, financial strain --nurse care coordination       Care Coordination Goals: Patient will continue to monitor and record blood pressure and heart rate daily and will call PCP with readings outside of recommended range Patient will reach out to RN Care Coordinator [phone] with any care coordination or resource needs  Patient will try interventions & suggested over the counter medicine to help with post nasal drip, allergies, coughs Patient will attend his  pending pharmacy visit to discuss cholesterol management/medicines   Interventions Today    Flowsheet Row Most Recent Value  Chronic Disease   Chronic disease during today's visit Other  [therapy, back pain mainly at night]  General Interventions   General Interventions Discussed/Reviewed General Interventions Reviewed, Sick Day Rules, [facility], Doctor Visits, Communication with  Doctor Visits Discussed/Reviewed Doctor Visits Reviewed, Audiological scientist with RN, PCP/Specialists  [employer] officer to referral coordinators to updated them that Mr Najarro is ready to start therapy outpatient or home health.]  Education Interventions   Education Provided Provided Education  Provided Verbal Education On Exercise, [facility]  [discussed home helath therapy vs outpatient  therapy]              SDOH assessments and interventions completed:  No     Care Coordination Interventions:  Yes, provided   Follow up plan: Follow up call scheduled for 08/08/22    Encounter Outcome:  Patient Visit Completed   Cala Bradford L. Noelle Penner, RN, BSN, [facility]  [facility] Care Management Coordinator  [phone]  Fax: [phone]

## 2023-07-09 NOTE — Patient Instructions (Addendum)
 Visit Information  Thank you for taking time to visit with me today. Please don't hesitate to contact me if I can be of assistance to you.   Following are the goals we discussed today:   Goals Addressed             This Visit's Progress    Care Coordination Activities       Interventions Today    Flowsheet Row Most Recent Value  General Interventions   General Interventions Discussed/Reviewed General Interventions Discussed, General Interventions Reviewed  [SW reviewed information from Medicaid. Pt agreed to continue to submit bills. Pt has not contacted collection company to set up billing.]              Our next appointment is no further scheduled appointments.  On n/a at n/a Willcontinue to work with LCSW  Please call the care guide team at [phone] if you need to cancel or reschedule your appointment.   If you are experiencing a Mental Health or Behavioral Health Crisis or need someone to talk to, please call the Suicide and Crisis Lifeline: 988 call the [place] National Suicide Prevention Lifeline: 925-821-4460 or TTY: (343) 247-7720 TTY 276 379 4696) to talk to a trained counselor call 1-800-273-TALK (toll free, 24 hour hotline) call the [facility]: (432)015-6592 call 911   Patient verbalizes understanding of instructions and care plan provided today and agrees to view in MyChart. Active MyChart status and patient understanding of how to access instructions and care plan via MyChart confirmed with patient.     The patient has been provided with contact information for the care management team and has been advised to call with any health related questions or concerns.   Awais Cobarrubias L. Noelle Penner, RN, BSN, [facility]  VBCI Care Management Coordinator  [phone]  Fax: [phone]

## 2023-07-12 ENCOUNTER — Ambulatory Visit (INDEPENDENT_AMBULATORY_CARE_PROVIDER_SITE_OTHER): Payer: Medicare PPO | Admitting: Professional Counselor

## 2023-07-12 ENCOUNTER — Encounter: Payer: Self-pay | Admitting: Neurology

## 2023-07-12 ENCOUNTER — Ambulatory Visit: Payer: Self-pay | Admitting: *Deleted

## 2023-07-12 ENCOUNTER — Ambulatory Visit: Payer: Medicare PPO | Admitting: Neurology

## 2023-07-12 VITALS — Ht 67.0 in | Wt 213.0 lb

## 2023-07-12 DIAGNOSIS — F418 Other specified anxiety disorders: Secondary | ICD-10-CM | POA: Diagnosis not present

## 2023-07-12 DIAGNOSIS — R519 Headache, unspecified: Secondary | ICD-10-CM

## 2023-07-12 DIAGNOSIS — F330 Major depressive disorder, recurrent, mild: Secondary | ICD-10-CM | POA: Diagnosis not present

## 2023-07-12 DIAGNOSIS — R29818 Other symptoms and signs involving the nervous system: Secondary | ICD-10-CM | POA: Diagnosis not present

## 2023-07-12 MED ORDER — DULOXETINE HCL 60 MG PO CPEP: 60.0000 mg | ORAL_CAPSULE | Freq: Every day | ORAL | 3 refills | Status: DC

## 2023-07-12 MED ORDER — DULOXETINE HCL 30 MG PO CPEP: 30.0000 mg | ORAL_CAPSULE | Freq: Every day | ORAL | 0 refills | Status: DC

## 2023-07-12 NOTE — Progress Notes (Signed)
Chief Complaint  Patient presents with   Dizziness    RM 14 alone  Pt is well, reports he gets lightheaded with positional changes, if he bends over or having a bowel movement. Having ringing in R ear.  Has a headache 2-3 days a week       ASSESSMENT AND PLAN  Steven [name] is a 72 y.o. male   Constellation of complaints, including frequent headaches, body achy pain in the setting of depression anxiety,  Is on Remeron 7.5 mg every night,  Will try Cymbalta 30 mg, titrating to 60 mg daily for symptomatic control,  Essentially normal neurological examination, CT head without contrast,  Suggest him continue to follow-up with his primary care,  DIAGNOSTIC DATA (LABS, IMAGING, TESTING) - I reviewed patient records, labs, notes, testing and imaging myself where available.   MEDICAL HISTORY:  Steven [name] is a 72 year old male, seen by Dr. Delena Bali in March 2024, referred by [facility] [place] N[place], [place], for evaluation of body achy pain, headache,  History is obtained from the patient and review of electronic medical records. I personally reviewed pertinent available imaging films in PACS.   PMHx of  HTN Depression A fib History of fall with multiple left side rib fracture, body achy pain History of kidney stone Prostate cancer, s/p radiation therapy History of Anal fissure, required BOTOX Injection  He complains of depression anxiety symptoms that are not under good control, he has multiple medications on his list, but not taking BuSpar, on low-dose of Remeron, Lyrica 100 mg twice a day, he spent most of time sleeping, no energy to do much, occasionally headache, tried Emgality without helping him, complains of fatigue, intermittent body achy pain involving different spot, but denied persistent motor or sensory deficit, ambulate without assistant  CT head showed no acute abnormality CT Angiogram of  neck 1. Minimal atherosclerosis without a large vessel  occlusion or significant stenosis. 2. Aortic Atherosclerosis (ICD10-I70.0).   Lab in Nov 2021, normal lipid panel, TSH, CBC, ERS, CRP, BMP   PHYSICAL EXAM:   Vitals:   07/12/23 0805  Weight: 213 lb (96.6 kg)  Height: 5\' 7"  (1.702 m)   Not recorded     Body mass index is 33.36 kg/m.  PHYSICAL EXAMNIATION:  Gen: NAD, conversant, well nourised, well groomed                     Cardiovascular: Regular rate rhythm, no peripheral edema, warm, nontender. Eyes: Conjunctivae clear without exudates or hemorrhage Neck: Supple, no carotid bruits. Pulmonary: Clear to auscultation bilaterally   NEUROLOGICAL EXAM:  MENTAL STATUS: Speech/cognition: Depressed looking elderly male, awake, alert, oriented to history taking and casual conversation CRANIAL NERVES: CN II: Visual fields are full to confrontation. Pupils are round equal and briskly reactive to light. CN III, IV, VI: extraocular movement are normal. No ptosis. CN V: Facial sensation is intact to light touch CN VII: Face is symmetric with normal eye closure  CN VIII: Hearing is normal to causal conversation. CN IX, X: Phonation is normal. CN XI: Head turning and shoulder shrug are intact  MOTOR: There is no pronator drift of out-stretched arms. Muscle bulk and tone are normal. Muscle strength is normal.  REFLEXES: Reflexes are 2+ and symmetric at the biceps, triceps, knees, and ankles. Plantar responses are flexor.  SENSORY: Intact to light touch, pinprick and vibratory sensation are intact in fingers and toes.  COORDINATION: There is no trunk or limb  dysmetria noted.  GAIT/STANCE: Posture is normal. Gait is steady  REVIEW OF SYSTEMS:  Full 14 system review of systems performed and notable only for as above All other review of systems were negative.   ALLERGIES: Allergies  Allergen Reactions   Latex Rash and Other (See Comments)    Possible reaction to latex gloves per patient   Meclizine Other (See Comments)     Other reaction(s): Abdominal Pain, Other    HOME MEDICATIONS: Current Outpatient Medications  Medication Sig Dispense Refill   acetaminophen (TYLENOL) 325 MG tablet Take 325 mg by mouth every 6 (six) hours as needed.     alfuzosin (UROXATRAL) 10 MG 24 hr tablet Take 1 tablet (10 mg total) by mouth daily with breakfast. 90 tablet 3   amLODipine (NORVASC) 10 MG tablet Take 1 tablet (10 mg total) by mouth daily. 90 tablet 3   amLODipine (NORVASC) 5 MG tablet Take 5 mg by mouth daily.     apixaban (ELIQUIS) 5 MG TABS tablet Take 1 tablet (5 mg total) by mouth 2 (two) times daily. 60 tablet 5   apixaban (ELIQUIS) 5 MG TABS tablet Take 1 tablet (5 mg total) by mouth 2 (two) times daily. 14 tablet 0   Azelastine-Fluticasone 137-50 MCG/ACT SUSP Place 1 spray into the nose every 12 (twelve) hours. 23 g 1   busPIRone (BUSPAR) 15 MG tablet Take 1 tablet (15 mg total) by mouth 2 (two) times daily. 60 tablet 2   Cholecalciferol (VITAMIN D3) 125 MCG (5000 UT) CAPS Take 1 capsule by mouth daily.      cloNIDine (CATAPRES) 0.1 MG tablet Take 1 tablet (0.1 mg total) by mouth 2 (two) times daily as needed (SBP>160). 60 tablet 11   ezetimibe (ZETIA) 10 MG tablet Take 1 tablet (10 mg total) by mouth daily. 90 tablet 1   furosemide (LASIX) 20 MG tablet Take 1 tablet (20 mg total) by mouth daily. Take with or after meal. 90 tablet 3   gabapentin (NEURONTIN) 300 MG capsule Take 300 mg by mouth 2 (two) times daily.     Galcanezumab-gnlm (EMGALITY) 120 MG/ML SOAJ Inject 1 Pen into the skin every 30 (thirty) days. 1.12 mL 7   hydrOXYzine (ATARAX) 10 MG tablet Take 1 tablet (10 mg total) by mouth 3 (three) times daily as needed for anxiety. 30 tablet 0   hyoscyamine (LEVBID) 0.375 MG 12 hr tablet Take 0.375 mg by mouth 2 (two) times daily.     loratadine (CLARITIN) 10 MG tablet Take 1 tablet (10 mg total) by mouth daily. 30 tablet 11   loratadine (CLARITIN) 10 MG tablet Take 10 mg by mouth daily.     losartan  (COZAAR) 100 MG tablet Take 100 mg by mouth daily.     losartan (COZAAR) 100 MG tablet Take 50 mg by mouth daily.     methimazole (TAPAZOLE) 5 MG tablet Take 0.5 tablets (2.5 mg total) by mouth daily. 45 tablet 1   metoprolol tartrate (LOPRESSOR) 50 MG tablet Take 50 mg by mouth 2 (two) times daily.     mirtazapine (REMERON) 15 MG tablet Take 1 tablet (15 mg total) by mouth at bedtime. 30 tablet 2   mirtazapine (REMERON) 7.5 MG tablet TAKE 1 TABLET(7.5 MG) BY MOUTH AT BEDTIME 30 tablet 1   NURTEC 75 MG TBDP Take 75 mg by mouth daily as needed (pain).     omeprazole (PRILOSEC) 20 MG capsule Take 20 mg by mouth daily.     ondansetron (ZOFRAN)  4 MG tablet Take 1 tablet (4 mg total) by mouth every 8 (eight) hours as needed for nausea or vomiting. 20 tablet 1   polyethylene glycol (MIRALAX / GLYCOLAX) 17 g packet Take 17 g by mouth daily. 30 each 2   polyethylene glycol (MIRALAX) 17 g packet Take 17 g by mouth once.     potassium chloride (KLOR-CON) 20 MEQ packet Take 20 mEq by mouth once.     potassium chloride SA (KLOR-CON M) 20 MEQ tablet Take 1 tablet (20 mEq total) by mouth daily. 30 tablet 0   pregabalin (LYRICA) 100 MG capsule Take 1 capsule (100 mg total) by mouth 2 (two) times daily. 60 capsule 2   Probiotic Product (PROBIOTIC-10 PO) Take 1 capsule by mouth daily.     saccharomyces boulardii (FLORASTOR) 250 MG capsule Take 250 mg by mouth 2 (two) times daily.     tiZANidine (ZANAFLEX) 4 MG tablet Take 1 tablet (4 mg total) by mouth every 6 (six) hours as needed for muscle spasms. 30 tablet 0   triamcinolone cream (KENALOG) 0.1 % Apply 1 Application topically 2 (two) times daily. 30 g 0   Ubrogepant (UBRELVY) 100 MG TABS Take 1 tablet (100 mg total) by mouth as needed (for headaches). May repeat a dose in 2 hours if needed. Max dose 2 pills in 24 hours 16 tablet 6   vitamin B-12 (CYANOCOBALAMIN) 100 MCG tablet Take 100 mcg by mouth daily.     metoprolol tartrate (LOPRESSOR) 25 MG tablet Take 1  tablet (25 mg total) by mouth 2 (two) times daily. TAKE EXTRA HALF TABLET  AS NEEDED 180 tablet 2   No current facility-administered medications for this visit.    PAST MEDICAL HISTORY: Past Medical History:  Diagnosis Date   Anxiety    Arthritis    Atrial fibrillation (HCC)    Atrial flutter (HCC)    BPH (benign prostatic hyperplasia)    Chest pain    Dizziness    Dyspnea    laying down occ   Fracture 08/17/2015   MULTIPLE RIB FRACTURES     FROM FALL    GERD (gastroesophageal reflux disease)    Hemorrhoids    History of kidney stones    noted on CT scan   History of radiation therapy 08/22/11-10/13/11   prostate   Hyperlipemia    Hypertension    Hyperthyroidism    IBS (irritable bowel syndrome)    Insomnia    Light headedness    Migraine    Numbness and tingling in left arm    Numbness and tingling of both legs    Open fracture of left elbow 08/18/2015   Prostate cancer [facility])    prostate s/p radiation Mar 2013   Rib fractures 08/17/2015   Tinnitus     PAST SURGICAL HISTORY: Past Surgical History:  Procedure Laterality Date   BOTOX INJECTION N/A 01/14/2020   Procedure: INJECTION OF BOTOX INTO ANAL SPHINCTER;  Surgeon: Andria Meuse, MD;  Location: [facility];  Service: General;  Laterality: N/A;   COLONOSCOPY N/A 12/19/2012   FAO:ZHYQMV 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 Normal appearing colon   COLONOSCOPY WITH PROPOFOL N/A 10/23/2019   Procedure: COLONOSCOPY WITH PROPOFOL;  Surgeon: Corbin Ade, MD; external and grade 2 internal hemorrhoids, abnormal rectal blood vessels consistent with radiation proctitis s/p APC therapy, otherwise normal exam.   EVALUATION UNDER ANESTHESIA WITH ANAL FISTULECTOMY N/A 01/14/2020   Procedure: ANORECTAL  EXAM UNDER ANESTHESIA;  Surgeon: Andria Meuse, MD;  Location: [facility];  Service: General;  Laterality: N/A;   HOT HEMOSTASIS  10/23/2019   Procedure: HOT  HEMOSTASIS (ARGON PLASMA COAGULATION/BICAP);  Surgeon: Corbin Ade, MD;  Location: [facility];  Service: Endoscopy;;  apc rectal proctitis     KIDNEY SURGERY  1982   kidney tube collapse repair   RADIOACTIVE SEED IMPLANT     Prostate   SVT ABLATION N/A 12/12/2022   Procedure: SVT ABLATION;  Surgeon: Marinus Maw, MD;  Location: [facility] INVASIVE CV LAB;  Service: Cardiovascular;  Laterality: N/A;    FAMILY HISTORY: Family History  Problem Relation Age of Onset   Aneurysm Mother        aortic   Hypertension Mother    Headache Mother    Prostate cancer Father    Hypertension Father    Heart failure Brother    Colon cancer Neg Hx    Colon polyps Neg Hx     SOCIAL HISTORY: Social History   Socioeconomic History   Marital status: Single    Spouse name: Not on file   Number of children: 2   Years of education: 12   Highest education level: 12th grade  Occupational History   Occupation: Custodian     Employer: [employer]  Tobacco Use   Smoking status: Former    Current packs/day: 0.00    Average packs/day: 1 pack/day for 20.0 years (20.0 ttl pk-yrs)    Types: Cigarettes    Start date: 2000    Quit date: 2020    Years since quitting: 4.9    Passive exposure: Past   Smokeless tobacco: Never   Tobacco comments:    Quit smoking x 2 years    07/2015   SOMETIIMES i USE VAPOR     Smoking Cessation Classes, Agencies, Aeronautical engineer Use   Vaping status: Never Used  Substance and Sexual Activity   Alcohol use: Not Currently   Drug use: Never   Sexual activity: Not Currently    Partners: Female  Other Topics Concern   Not on file  Social History Narrative   Lives with friend   Divorced   No caffeine   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   Social Drivers of Corporate investment banker Strain: Low Risk  (06/25/2023)   Overall Financial Resource Strain (CARDIA)    Difficulty of Paying Living Expenses: Not hard at all   Recent Concern: Financial Resource Strain - High Risk (05/08/2023)   Overall Financial Resource Strain (CARDIA)    Difficulty of Paying Living Expenses: Hard  Food Insecurity: No Food Insecurity (06/25/2023)   Hunger Vital Sign    Worried About Running Out of Food in the Last Year: Never true    Ran Out of Food in the Last Year: Never true  Transportation Needs: No Transportation Needs (06/25/2023)   PRAPARE - Administrator, Civil Service (Medical): No    Lack of Transportation (Non-Medical): No  Physical Activity: Sufficiently Active (06/25/2023)   Exercise Vital Sign    Days of Exercise per Week: 7 days    Minutes of Exercise per Session: 50 min  Stress: Stress Concern Present (06/25/2023)   [employer] of Occupational Health - Occupational Stress Questionnaire    Feeling of Stress : To some extent  Social Connections: Moderately Integrated (06/25/2023)   Social Connection and Isolation Panel [NHANES]  Frequency of Communication with Friends and Family: More than three times a week    Frequency of Social Gatherings with Friends and Family: More than three times a week    Attends Religious Services: More than 4 times per year    Active Member of [employer] or Organizations: No    Attends Banker Meetings: Never    Marital Status: Living with partner  Intimate Partner Violence: Not At Risk (06/25/2023)   Humiliation, Afraid, Rape, and Kick questionnaire    Fear of Current or Ex-Partner: No    Emotionally Abused: No    Physically Abused: No    Sexually Abused: No      Levert Feinstein, M.D. Ph.D.  [facility] [address] [place], [place] [address] Ph: [phone] Fax: [phone]  CC:  Elfredia Nevins, MD [address] [place],  [place] [address]  Del Nigel Berthold, FNP

## 2023-07-12 NOTE — Patient Instructions (Signed)
Visit Information  Thank you for taking time to visit with me today. Please don't hesitate to contact me if I can be of assistance to you.   Following are the goals we discussed today:   Goals Addressed               This Visit's Progress     Receive Counseling & Supportive Services for Symptoms of Anxiety. (pt-stated)   On track     Care Coordination Interventions:  Interventions Today    Flowsheet Row Most Recent Value  Chronic Disease   Chronic disease during today's visit Atrial Fibrillation (AFib), Hypertension (HTN), Other  [Diffuse Pain, Recurrent Depression, History of Falls & Fall Risk, Dizzy Spells, Fibromyalgia, Sleep Apnea, Dysphagia, Prediabetes, Obesity, Post Nasal Drip, Nausea.]  General Interventions   General Interventions Discussed/Reviewed General Interventions Discussed, General Interventions Reviewed, [facility], Level of Care, Communication with, Vaccines, Doctor Visits, Health Screening  [Encouraged Routine Engagement with Care Team Members.]  Vaccines COVID-19, Flu, Pneumonia, RSV, Shingles, Tetanus/Pertussis/Diphtheria  [Encouraged Annual Vaccinations.]  Doctor Visits Discussed/Reviewed Doctor Visits Discussed, Specialist, Doctor Visits Reviewed, Annual Wellness Visits, PCP  [Encouraged Routine Engagement with Care Team Members.]  Health Screening Bone Density, Colonoscopy, Prostate  [Encouraged Annual Health Screenings.]  PCP/Specialist Visits Compliance with follow-up visit  [Encouraged Routine Engagement with Care Team Members.]  Communication with PCP/Specialists, RN, Pharmacists, Social Work  [employer] Routine Engagement with Care Team Members.]  Level of Care Adult Daycare, Air traffic controller, Assisted Living, Skilled Nursing Facility  [Confirmed Disinterest in Enrollment in Adult Day Care Program or Receiving Assistance Pursuing Higher Level of Care Placement Options (I.e Assisted Living Versus Skilled Nursing Facility).]  Applications Medicaid,  Personal Care Services  [Confirmed Disinterest in Applying for Medicaid or Personal Care Services.]  Exercise Interventions   Exercise Discussed/Reviewed Exercise Discussed, Assistive device use and maintanence, Exercise Reviewed, Physical Activity, Weight Managment  [Encouraged Daily Exercise Regimen, as Tolerated.]  Physical Activity Discussed/Reviewed Physical Activity Discussed, Home Exercise Program (HEP), Physical Activity Reviewed, PREP, Gym, Types of exercise  [Encouraged Increased Level of Activity & Exercise, Inside & Outside the Home.]  Weight Management Weight loss  [Encouraged Healthy Diet & Drink Cathleen Fears of Fluids.]  Education Interventions   Education Provided Provided Education  [employer] Reviewed Educational Material & Entertained Questions to Ensure Understanding.]  Provided Verbal Education On Nutrition, Mental Health/Coping with Illness, When to see the doctor, Air traffic controller, [facility], [employer], Medication, Exercise  [Encouraged Consideration & Implementation of Educational Material.]  Ship broker, Personal Care Services  [Confirmed Disinterest in Applying for [employer] or Personal Care Services.]  Mental Health Interventions   Mental Health Discussed/Reviewed Mental Health Discussed, Anxiety, Depression, Grief and Loss, Mental Health Reviewed, Substance Abuse, Coping Strategies, Suicide, Crisis, Other  [Assessed Mental Health & Cognitive Status.]  Nutrition Interventions   Nutrition Discussed/Reviewed Nutrition Discussed, Nutrition Reviewed, Carbohydrate meal planning, Increasing proteins, Adding fruits and vegetables, Decreasing fats, Fluid intake, Decreasing salt, Portion sizes, Decreasing sugar intake  [Encouraged Heart-Healthy, Reduced Fat, Low Sodium, Fiber-Rich, Decreased Sugar Diet.]  Pharmacy Interventions   Pharmacy Dicussed/Reviewed Pharmacy Topics Discussed, Medications and their functions, Medication Adherence, Pharmacy Topics Reviewed,  Affording Medications  [Confirmed Ability to Afford Prescription Medications.]  Medication Adherence --  [Confirmed Compliance with Prescription Medications.]  Safety Interventions   Safety Discussed/Reviewed Safety Discussed, Safety Reviewed, Fall Risk, Home Safety  [Encouraged Consideration of Home Safety Evaluation. Encouraged Routine Use of Assistive Devices.]  Home Safety Assistive Devices, Contact provider for referral to PT/OT, Need for home safety assessment  [  Encouraged Consideration of Home Health Physical & Occupational Therapy Services.]  Advanced Directive Interventions   Advanced Directives Discussed/Reviewed Advanced Directives Discussed, Advanced Directives Reviewed  [Encouraged Initiation of Advanced Directives (Living Will & Healthcare Power of Attorney), Offering to [employer], Assist with Completion, Make Copies & Scan into Electronic Medical Record in Epic.]      Active Listening & Reflection Utilized.  Verbalization of Feelings Encouraged.  Emotional Support Provided. Acceptance & Commitment Therapy Performed. Cognitive Behavioral Therapy Conducted. Client-Centered Therapy Initiated. Encouraged Routine Engagement with Danford Bad, Licensed Clinical Social Worker with [facility] [phone]), if You Have Questions, Need Assistance, or If Additional Social Work Needs Are Identified Between Now & Our Next Follow-Up Outreach Call, Scheduled on 08/09/2023 at 11:30 AM.      Our next appointment is by telephone on 08/09/2023 at 11:30 am.  Please call the care guide team at [phone] if you need to cancel or reschedule your appointment.   If you are experiencing a Mental Health or Behavioral Health Crisis or need someone to talk to, please call the Suicide and Crisis Lifeline: 988 call the [place] National Suicide Prevention Lifeline: 732-337-6128 or TTY: 873 048 5130 TTY 6077218585) to talk to a trained counselor call 1-800-273-TALK (toll free,  24 hour hotline) go to [facility] [address] [place] [phone]) call the [facility] Crisis Line: [phone] call 911  Patient verbalizes understanding of instructions and care plan provided today and agrees to view in MyChart. Active MyChart status and patient understanding of how to access instructions and care plan via MyChart confirmed with patient.     Telephone follow up appointment with care management team member scheduled for:  08/09/2023 at 11:30 am.  Danford Bad, BSW, MSW, LCSW  Embedded Practice Social Work Case Manager  [facility], Population Health Direct Dial: [phone]  Fax: [phone] Email: [email or web address]: [email or web address]

## 2023-07-12 NOTE — Patient Outreach (Signed)
Care Coordination   Follow Up Visit Note   07/12/2023  Name: Steven [name] MRN: [id number] DOB: 1951/05/19  Steven [name] is a 72 y.o. year old male who sees Del Newman Nip, Anderson, FNP for primary care. I spoke with [name] by phone today.  What matters to the patients health and wellness today?  Receive Counseling & Supportive Services for Symptoms of Anxiety.    Goals Addressed               This Visit's Progress     Receive Counseling & Supportive Services for Symptoms of Anxiety. (pt-stated)   On track     Care Coordination Interventions:  Interventions Today    Flowsheet Row Most Recent Value  Chronic Disease   Chronic disease during today's visit Atrial Fibrillation (AFib), Hypertension (HTN), Other  [Diffuse Pain, Recurrent Depression, History of Falls & Fall Risk, Dizzy Spells, Fibromyalgia, Sleep Apnea, Dysphagia, Prediabetes, Obesity, Post Nasal Drip, Nausea.]  General Interventions   General Interventions Discussed/Reviewed General Interventions Discussed, General Interventions Reviewed, [employer], Level of Care, Communication with, Vaccines, Doctor Visits, Health Screening  [Encouraged Routine Engagement with Care Team Members.]  Vaccines COVID-19, Flu, Pneumonia, RSV, Shingles, Tetanus/Pertussis/Diphtheria  [Encouraged Annual Vaccinations.]  Doctor Visits Discussed/Reviewed Doctor Visits Discussed, Specialist, Doctor Visits Reviewed, Annual Wellness Visits, PCP  [Encouraged Routine Engagement with Care Team Members.]  Health Screening Bone Density, Colonoscopy, Prostate  [Encouraged Annual Health Screenings.]  PCP/Specialist Visits Compliance with follow-up visit  [Encouraged Routine Engagement with Care Team Members.]  Communication with PCP/Specialists, RN, Pharmacists, Social Work  [employer] Routine Engagement with Care Team Members.]  Level of Care Adult Daycare, Air traffic controller, Assisted Living, Skilled Nursing Facility  [Confirmed  Disinterest in Enrollment in Adult Day Care Program or Receiving Assistance Pursuing Higher Level of Care Placement Options (I.e Assisted Living Versus Skilled Nursing Facility).]  Applications Medicaid, Personal Care Services  [Confirmed Disinterest in Applying for Medicaid or Personal Care Services.]  Exercise Interventions   Exercise Discussed/Reviewed Exercise Discussed, Assistive device use and maintanence, Exercise Reviewed, Physical Activity, Weight Managment  [Encouraged Daily Exercise Regimen, as Tolerated.]  Physical Activity Discussed/Reviewed Physical Activity Discussed, Home Exercise Program (HEP), Physical Activity Reviewed, PREP, Gym, Types of exercise  [Encouraged Increased Level of Activity & Exercise, Inside & Outside the Home.]  Weight Management Weight loss  [Encouraged Healthy Diet & Drink Cathleen Fears of Fluids.]  Education Interventions   Education Provided Provided Education  [employer] Reviewed Educational Material & Entertained Questions to Ensure Understanding.]  Provided Verbal Education On Nutrition, Mental Health/Coping with Illness, When to see the doctor, Air traffic controller, [employer], [employer], Medication, Exercise  [Encouraged Consideration & Implementation of Educational Material.]  Ship broker, Personal Care Services  [Confirmed Disinterest in Applying for [employer] or Personal Care Services.]  Mental Health Interventions   Mental Health Discussed/Reviewed Mental Health Discussed, Anxiety, Depression, Grief and Loss, Mental Health Reviewed, Substance Abuse, Coping Strategies, Suicide, Crisis, Other  [Assessed Mental Health & Cognitive Status.]  Nutrition Interventions   Nutrition Discussed/Reviewed Nutrition Discussed, Nutrition Reviewed, Carbohydrate meal planning, Increasing proteins, Adding fruits and vegetables, Decreasing fats, Fluid intake, Decreasing salt, Portion sizes, Decreasing sugar intake  [Encouraged Heart-Healthy, Reduced Fat, Low Sodium,  Fiber-Rich, Decreased Sugar Diet.]  Pharmacy Interventions   Pharmacy Dicussed/Reviewed Pharmacy Topics Discussed, Medications and their functions, Medication Adherence, Pharmacy Topics Reviewed, Affording Medications  [Confirmed Ability to Afford Prescription Medications.]  Medication Adherence --  [Confirmed Compliance with Prescription Medications.]  Safety Interventions   Safety Discussed/Reviewed Safety Discussed,  Safety Reviewed, Fall Risk, Home Safety  [Encouraged Consideration of Home Safety Evaluation. Encouraged Routine Use of Assistive Devices.]  Home Safety Assistive Devices, Contact provider for referral to PT/OT, Need for home safety assessment  [Encouraged Consideration of Home Health Physical & Occupational Therapy Services.]  Advanced Directive Interventions   Advanced Directives Discussed/Reviewed Advanced Directives Discussed, Advanced Directives Reviewed  [Encouraged Initiation of Advanced Directives (Living Will & Healthcare Power of Attorney), Offering to [employer], Assist with Completion, Make Copies & Scan into Electronic Medical Record in Epic.]      Active Listening & Reflection Utilized.  Verbalization of Feelings Encouraged.  Emotional Support Provided. Acceptance & Commitment Therapy Performed. Cognitive Behavioral Therapy Conducted. Client-Centered Therapy Initiated. Encouraged Routine Engagement with Danford Bad, Licensed Clinical Social Worker with [facility] [phone]), if You Have Questions, Need Assistance, or If Additional Social Work Needs Are Identified Between Now & Our Next Follow-Up Outreach Call, Scheduled on 08/09/2023 at 11:30 AM.      SDOH assessments and interventions completed:  Yes.  Care Coordination Interventions:  Yes, provided.   Follow up plan: Follow up call scheduled for 08/09/2023 at 11:30 am.  Encounter Outcome:  Patient Visit Completed.   Danford Bad, BSW, MSW, Printmaker Social Work  Case Set designer Health  [facility], Population Health Direct Dial: [phone]  Fax: [phone] Email: [email or web address]

## 2023-07-13 NOTE — BH Specialist Note (Unsigned)
[facility] Telephone Follow-up  MRN: [id number] NAME: ALEXI HERRERO Date: 07/13/23  Start time: Start Time: 1100 End time: Stop Time: 1130 Total time: Total Time in Minutes (Visit): 30 Call number: Visit Number: 4- Fourth Visit  Reason for call today:  The patient is a 72 year old male presenting for a collaborative care follow-up. He continues to experience depressive and anxiety symptoms, primarily related to his ongoing medical conditions. He reports persistent pain in his hands and feet, which he believes is neuropathy. Despite numerous visits to specialists, frequent doctor appointments, and various tests, he has not yet received a clear diagnosis or effective treatment plan. This lack of resolution has left him feeling overwhelmed and discouraged, particularly as his symptoms seem to be worsening rather than improving.  The patient shared that his limitations have impacted his ability to engage in activities he once enjoyed. Behavioral counseling focused on exploring potential activities that could provide him with distraction, pleasure, and a sense of purpose. However, he expressed feeling disheartened about trying new things.  The patient will soon be starting Savella, a medication intended to help manage his symptoms. The plan is to monitor his response to the medication and follow up in two weeks to reassess his progress and provide continued support.  PHQ-9 Scores:     06/28/2023   10:39 AM 06/25/2023    9:55 AM 06/18/2023    3:11 PM 06/15/2023    1:34 PM 05/11/2023    8:06 AM  Depression screen PHQ 2/9  Decreased Interest 1 1 1 1  0  Down, Depressed, Hopeless 0 0 0  0  PHQ - 2 Score 1 1 1 1  0  Altered sleeping 1 1 1 1  0  Tired, decreased energy 1 1 1 1  0  Change in appetite  0 0 1 0  Feeling bad or failure about yourself   0 0 1 0  Trouble concentrating  0 0  0  Moving slowly or fidgety/restless  0 0  0  Suicidal thoughts  0 0  0  PHQ-9 Score 3 3 3 5  0   Difficult doing work/chores  Somewhat difficult Somewhat difficult  Not difficult at all   GAD-7 Scores:     06/18/2023    3:11 PM 05/11/2023    8:06 AM 04/19/2023   11:09 AM 04/06/2023   10:48 AM  GAD 7 : Generalized Anxiety Score  Nervous, Anxious, on Edge 1 0 0 0  Control/stop worrying 1 0 0 0  Worry too much - different things 1 0 0 0  Trouble relaxing 0 0 0 0  Restless 0 0 0 0  Easily annoyed or irritable 1 0 0 0  Afraid - awful might happen 1 0 0 0  Total GAD 7 Score 5 0 0 0  Anxiety Difficulty Somewhat difficult Not difficult at all Not difficult at all Not difficult at all    Stress Current stressors:  Medical concerns Sleep:  Disrupted Appetite:  Good Coping ability:  Good Patient taking medications as prescribed:  Yes  Current medications:  Outpatient Encounter Medications as of 07/12/2023  Medication Sig   acetaminophen (TYLENOL) 325 MG tablet Take 325 mg by mouth every 6 (six) hours as needed.   alfuzosin (UROXATRAL) 10 MG 24 hr tablet Take 1 tablet (10 mg total) by mouth daily with breakfast.   amLODipine (NORVASC) 10 MG tablet Take 1 tablet (10 mg total) by mouth daily.   amLODipine (NORVASC) 5 MG tablet Take 5 mg  by mouth daily.   apixaban (ELIQUIS) 5 MG TABS tablet Take 1 tablet (5 mg total) by mouth 2 (two) times daily.   Azelastine-Fluticasone 137-50 MCG/ACT SUSP Place 1 spray into the nose every 12 (twelve) hours.   Cholecalciferol (VITAMIN D3) 125 MCG (5000 UT) CAPS Take 1 capsule by mouth daily.    cloNIDine (CATAPRES) 0.1 MG tablet Take 1 tablet (0.1 mg total) by mouth 2 (two) times daily as needed (SBP>160).   DULoxetine (CYMBALTA) 30 MG capsule Take 1 capsule (30 mg total) by mouth daily.   DULoxetine (CYMBALTA) 60 MG capsule Take 1 capsule (60 mg total) by mouth daily.   ezetimibe (ZETIA) 10 MG tablet Take 1 tablet (10 mg total) by mouth daily.   furosemide (LASIX) 20 MG tablet Take 1 tablet (20 mg total) by mouth daily. Take with or after meal.    Galcanezumab-gnlm (EMGALITY) 120 MG/ML SOAJ Inject 1 Pen into the skin every 30 (thirty) days.   hydrOXYzine (ATARAX) 10 MG tablet Take 1 tablet (10 mg total) by mouth 3 (three) times daily as needed for anxiety.   hyoscyamine (LEVBID) 0.375 MG 12 hr tablet Take 0.375 mg by mouth 2 (two) times daily.   loratadine (CLARITIN) 10 MG tablet Take 1 tablet (10 mg total) by mouth daily.   loratadine (CLARITIN) 10 MG tablet Take 10 mg by mouth daily.   losartan (COZAAR) 100 MG tablet Take 100 mg by mouth daily.   losartan (COZAAR) 100 MG tablet Take 50 mg by mouth daily.   methimazole (TAPAZOLE) 5 MG tablet Take 0.5 tablets (2.5 mg total) by mouth daily.   metoprolol tartrate (LOPRESSOR) 25 MG tablet Take 1 tablet (25 mg total) by mouth 2 (two) times daily. TAKE EXTRA HALF TABLET  AS NEEDED   metoprolol tartrate (LOPRESSOR) 50 MG tablet Take 50 mg by mouth 2 (two) times daily.   mirtazapine (REMERON) 15 MG tablet Take 1 tablet (15 mg total) by mouth at bedtime.   mirtazapine (REMERON) 7.5 MG tablet TAKE 1 TABLET(7.5 MG) BY MOUTH AT BEDTIME   NURTEC 75 MG TBDP Take 75 mg by mouth daily as needed (pain).   omeprazole (PRILOSEC) 20 MG capsule Take 20 mg by mouth daily.   ondansetron (ZOFRAN) 4 MG tablet Take 1 tablet (4 mg total) by mouth every 8 (eight) hours as needed for nausea or vomiting.   polyethylene glycol (MIRALAX / GLYCOLAX) 17 g packet Take 17 g by mouth daily.   polyethylene glycol (MIRALAX) 17 g packet Take 17 g by mouth once.   potassium chloride (KLOR-CON) 20 MEQ packet Take 20 mEq by mouth once.   potassium chloride SA (KLOR-CON M) 20 MEQ tablet Take 1 tablet (20 mEq total) by mouth daily.   pregabalin (LYRICA) 100 MG capsule Take 1 capsule (100 mg total) by mouth 2 (two) times daily.   Probiotic Product (PROBIOTIC-10 PO) Take 1 capsule by mouth daily.   saccharomyces boulardii (FLORASTOR) 250 MG capsule Take 250 mg by mouth 2 (two) times daily.   tiZANidine (ZANAFLEX) 4 MG tablet Take 1  tablet (4 mg total) by mouth every 6 (six) hours as needed for muscle spasms.   triamcinolone cream (KENALOG) 0.1 % Apply 1 Application topically 2 (two) times daily.   Ubrogepant (UBRELVY) 100 MG TABS Take 1 tablet (100 mg total) by mouth as needed (for headaches). May repeat a dose in 2 hours if needed. Max dose 2 pills in 24 hours   vitamin B-12 (CYANOCOBALAMIN) 100 MCG tablet  Take 100 mcg by mouth daily.   [DISCONTINUED] gabapentin (NEURONTIN) 300 MG capsule Take 1 capsule (300 mg total) by mouth 3 (three) times daily as needed.   No facility-administered encounter medications on file as of 07/12/2023.     Self-harm Behaviors Risk Assessment Self-harm risk factors:  Depression and aging Patient endorses recent thoughts of harming self:  Denies   Danger to Others Risk Assessment Danger to others risk factors:  None Patient endorses recent thoughts of harming others:  Denies  Dynamic Appraisal of Situational Aggression (DASA):      No data to display           Substance Use Assessment Patient recently consumed alcohol:  None  Alcohol Use Disorder Identification Test (AUDIT):     11/03/2020   10:37 AM 01/08/2023    6:25 PM 03/30/2023    8:46 AM 06/25/2023    9:55 AM  Alcohol Use Disorder Test (AUDIT)  1. How often do you have a drink containing alcohol? 0 0 0 1  2. How many drinks containing alcohol do you have on a typical day when you are drinking? 0 0 0 0  3. How often do you have six or more drinks on one occasion? 0 0 0 0  AUDIT-C Score 0 0 0 1    Goals, Interventions and Follow-up Plan Goals: Increase healthy adjustment to current life circumstances Interventions: Behavioral Activation and CBT Cognitive Behavioral Therapy Follow-up Plan: Refer to Psychiatrist for Medication Management   Reuel Boom

## 2023-07-16 ENCOUNTER — Other Ambulatory Visit: Payer: Medicare PPO

## 2023-07-16 ENCOUNTER — Ambulatory Visit: Payer: 59 | Admitting: Neurology

## 2023-07-23 ENCOUNTER — Other Ambulatory Visit: Payer: Self-pay

## 2023-07-23 ENCOUNTER — Ambulatory Visit (HOSPITAL_COMMUNITY): Payer: Medicare PPO | Attending: Family Medicine

## 2023-07-23 ENCOUNTER — Other Ambulatory Visit: Payer: Self-pay | Admitting: "Endocrinology

## 2023-07-23 DIAGNOSIS — M6281 Muscle weakness (generalized): Secondary | ICD-10-CM | POA: Insufficient documentation

## 2023-07-23 DIAGNOSIS — M545 Low back pain, unspecified: Secondary | ICD-10-CM | POA: Diagnosis not present

## 2023-07-23 DIAGNOSIS — M5459 Other low back pain: Secondary | ICD-10-CM | POA: Diagnosis not present

## 2023-07-23 DIAGNOSIS — R29898 Other symptoms and signs involving the musculoskeletal system: Secondary | ICD-10-CM | POA: Diagnosis not present

## 2023-07-23 NOTE — Therapy (Signed)
OUTPATIENT PHYSICAL THERAPY THORACOLUMBAR EVALUATION   Patient Name: Steven [name] MRN: [id number] DOB:09-17-1950, 72 y.o., male Today's Date: 07/23/2023  END OF SESSION:  PT End of Session - 07/23/23 1215     Visit Number 1    Number of Visits 4    Date for PT Re-Evaluation 08/20/23    Authorization Type [employer] Medicare Choice PPO (requested 4 visits)    PT Start Time 1025    PT Stop Time 1100    PT Time Calculation (min) 35 min    Activity Tolerance Patient tolerated treatment well    Behavior During Therapy WFL for tasks assessed/performed             Past Medical History:  Diagnosis Date   Anxiety    Arthritis    Atrial fibrillation (HCC)    Atrial flutter (HCC)    BPH (benign prostatic hyperplasia)    Chest pain    Dizziness    Dyspnea    laying down occ   Fracture 08/17/2015   MULTIPLE RIB FRACTURES     FROM FALL    GERD (gastroesophageal reflux disease)    Hemorrhoids    History of kidney stones    noted on CT scan   History of radiation therapy 08/22/11-10/13/11   prostate   Hyperlipemia    Hypertension    Hyperthyroidism    IBS (irritable bowel syndrome)    Insomnia    Light headedness    Migraine    Numbness and tingling in left arm    Numbness and tingling of both legs    Open fracture of left elbow 08/18/2015   Prostate cancer [facility])    prostate s/p radiation Mar 2013   Rib fractures 08/17/2015   Tinnitus    Past Surgical History:  Procedure Laterality Date   BOTOX INJECTION N/A 01/14/2020   Procedure: INJECTION OF BOTOX INTO ANAL SPHINCTER;  Surgeon: Andria Meuse, MD;  Location: [facility];  Service: General;  Laterality: N/A;   COLONOSCOPY N/A 12/19/2012   WUJ:[id number] 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 Normal appearing colon   COLONOSCOPY WITH PROPOFOL N/A 10/23/2019   Procedure: COLONOSCOPY WITH PROPOFOL;  Surgeon: Corbin Ade, MD; external and grade 2 internal  hemorrhoids, abnormal rectal blood vessels consistent with radiation proctitis s/p APC therapy, otherwise normal exam.   EVALUATION UNDER ANESTHESIA WITH ANAL FISTULECTOMY N/A 01/14/2020   Procedure: ANORECTAL EXAM UNDER ANESTHESIA;  Surgeon: Andria Meuse, MD;  Location: [facility];  Service: General;  Laterality: N/A;   HOT HEMOSTASIS  10/23/2019   Procedure: HOT HEMOSTASIS (ARGON PLASMA COAGULATION/BICAP);  Surgeon: Corbin Ade, MD;  Location: [facility];  Service: Endoscopy;;  apc rectal proctitis     KIDNEY SURGERY  1982   kidney tube collapse repair   RADIOACTIVE SEED IMPLANT     Prostate   SVT ABLATION N/A 12/12/2022   Procedure: SVT ABLATION;  Surgeon: Marinus Maw, MD;  Location: [facility];  Service: Cardiovascular;  Laterality: N/A;   Patient Active Problem List   Diagnosis Date Noted   Headache with neurologic deficit 07/12/2023   Contact dermatitis 06/27/2023   Low back pain 06/27/2023   Upper respiratory infection 06/07/2023   Inflammatory arthritis 05/23/2023   Pain of left hip 05/11/2023   Migraine, unspecified, not intractable, without status migrainosus 05/08/2023   Depression with anxiety 04/19/2023   Mixed hyperlipidemia 03/14/2023   Prediabetes 03/14/2023   Diffuse  pain 02/19/2023   Hypertensive urgency 11/24/2022   Leukopenia 11/24/2022   SVT (supraventricular tachycardia) (HCC) 11/24/2022   Orthostatic hypotension 11/23/2022   Chest tightness 08/22/2022   Hypokalemia 08/21/2022   Calculus of gallbladder without cholecystitis without obstruction 08/09/2022   Secondary adrenocortical insufficiency (HCC) 07/19/2022   Rash and nonspecific skin eruption 05/10/2022   Dysphagia 05/10/2022   Fibromyalgia 07/07/2020   Paresthesia of upper limb 03/04/2020   Carcinoma of prostate (HCC) 02/17/2020   Subacute thyroiditis 01/07/2020   Personal history of malignant neoplasm of prostate 12/12/2019   Benign prostatic hyperplasia with urinary obstruction  12/12/2019   Weak urinary stream 12/12/2019   Hyperthyroidism 11/24/2019   Unintentional weight loss 10/21/2019   IBS (irritable bowel syndrome) 10/21/2019   Dizzy spells 09/20/2019   Intractable chronic migraine without aura 09/20/2019   Sleep apnea 09/20/2019   Psychophysiologic insomnia 09/20/2019   Rectal pain 08/21/2019   Nausea without vomiting 05/30/2019   Paroxysmal atrial flutter (HCC) 04/17/2019   Chronic atrial fibrillation (HCC) 03/02/2019   HTN (hypertension) 03/02/2019   Constipation 08/02/2017   Hemorrhoids 08/02/2017   Fall 08/18/2015   Radiation proctitis 01/30/2013   Rectal bleeding 12/10/2012   Obese 05/30/2012   Dyspnea 05/30/2012   Elevated lipids 05/30/2012   Palpitations 05/30/2012   Malignant neoplasm of prostate (HCC) 08/13/2011    PCP: Rica Records, FNP  REFERRING PROVIDER: Rica Records, FNP  REFERRING DIAG: M54.50 (ICD-10-CM) - Lumbar pain  Rationale for Evaluation and Treatment: Rehabilitation  THERAPY DIAG:  Other symptoms and signs involving the musculoskeletal system  Muscle weakness (generalized)  Other low back pain  ONSET DATE: years ago  SUBJECTIVE:                                                                                                                                                                                           SUBJECTIVE STATEMENT: EVAL: Arrives to the clinic with stinging and stabbing pain randomly anywhere on the body. Sometimes the L LE can feel numb and the hips can feel weak and stiff. However, patient denies pain at the time of evaluation. Condition started years ago without apparent reason but patient fell in 2017 where he sustained multiple fx but was still able to work 3 years after. Denies any trauma recently. Imaging was done recently (see below). Patient went to a spine specialist lately and referred him to outpatient PT evaluation and management.  PERTINENT HISTORY:  SVT  ablation  PAIN:  Are you having pain? Yes: NPRS scale: 0 at present, 8-9/10 when it hurts Pain location: bottom of the feet, fingers, low back  Pain description: intermittent, very random, stinging and stabbing pain Aggravating factors: laying down and being still Relieving factors: cannot associate any relieving factor  PRECAUTIONS: None  RED FLAGS: None   WEIGHT BEARING RESTRICTIONS: No  FALLS:  Has patient fallen in last 6 months? No  LIVING ENVIRONMENT: Lives with: lives with an adult companion Lives in: House/apartment Stairs: Yes: External: 5 steps; can reach both Has following equipment at home: Single point cane  OCCUPATION: retired  PLOF: Independent and Independent with basic ADLs  PATIENT GOALS: "to help with these pains"  NEXT MD VISIT: unknown  OBJECTIVE:  Note: Objective measures were completed at Evaluation unless otherwise noted.  DIAGNOSTIC FINDINGS:  06/26/23 LUMBAR SPINE - COMPLETE 4+ VIEW   COMPARISON:  None Available.   FINDINGS: Five non-rib-bearing lumbar vertebra. Moderate dextroscoliotic curvature at the thoracolumbar junction. Straightening of normal lordosis. No evidence of fracture or compression deformity. Moderate anterior spurring with relative preservation of disc spaces. Minor lower lumbar facet hypertrophy. No visible pars defects or focal bone abnormality. The sacroiliac joints are congruent. Small surgical clips in the pelvis   IMPRESSION: 1. Moderate dextroscoliotic curvature at the thoracolumbar junction. 2. Moderate anterior spurring with relative preservation of disc spaces. Minor lower lumbar facet hypertrophy.    PATIENT SURVEYS:  FOTO 55.9  COGNITION: Overall cognitive status: Within functional limits for tasks assessed     SENSATION: Light touch: WFL on B LE  MUSCLE LENGTH: Hamstrings: moderate restriction on B Thomas test: moderate restriction on B Piriformis test: moderate restriction on B  POSTURE:  rounded shoulders, forward head, decreased lumbar lordosis, increased thoracic kyphosis, and knee slightly bent  PALPATION: No tenderness on major muscle mass of the lumbar spine  LUMBAR ROM:   AROM eval  Flexion 75%  Extension 50%  Right lateral flexion   Left lateral flexion   Right rotation 50%  Left rotation 50%   (Blank rows = not tested)  LOWER EXTREMITY ROM:     Active  Right eval Left eval  Hip flexion [facility]  Hip extension [facility]  Hip abduction [facility]  Hip adduction    Hip internal rotation    Hip external rotation    Knee flexion [facility] WFL  Knee extension [facility] WFL  Ankle dorsiflexion [facility] WFL  Ankle plantarflexion [facility] WFL  Ankle inversion    Ankle eversion     (Blank rows = not tested)  LOWER EXTREMITY MMT:    MMT Right eval Left eval  Hip flexion 4 4  Hip extension 3+ 3  Hip abduction 4- 4-  Hip adduction    Hip internal rotation    Hip external rotation    Knee flexion 4 4  Knee extension 4 4  Ankle dorsiflexion 4 4  Ankle plantarflexion 4 4  Ankle inversion    Ankle eversion     (Blank rows = not tested)  LUMBAR SPECIAL TESTS:  Straight leg raise test: Negative  FUNCTIONAL TESTS:  5 times sit to stand: 11.10 sec 2 minute walk test: 415 ft  GAIT: Distance walked: 415 ft Assistive device utilized: None Level of assistance: Complete Independence Comments: done during , knees slightly flexed on midstance, decreased swing phase on B  TREATMENT DATE:  07/23/23 Evaluation and patient education done  PATIENT EDUCATION:  Education details: Educated on the pathoanatomy of low back pain. Educated on the goals and course of rehab.  Person educated: Patient Education method: Explanation Education comprehension: verbalized understanding  HOME EXERCISE PROGRAM: None provided to date  ASSESSMENT:  CLINICAL  IMPRESSION: Patient is a 72 y.o. male who was seen today for physical therapy evaluation and treatment for lumbar pain. Patient's condition is further defined by difficulty with laying down due to pain, weakness, and decreased soft tissue extensibility. Skilled PT is required to address the impairments and functional limitations listed below.    OBJECTIVE IMPAIRMENTS: decreased activity tolerance, decreased ROM, decreased strength, impaired flexibility, and pain.   ACTIVITY LIMITATIONS: carrying, lifting, bending, sitting, standing, squatting, stairs, and bed mobility  PARTICIPATION LIMITATIONS: meal prep, cleaning, laundry, driving, shopping, community activity, and yard work  PERSONAL FACTORS: Time since onset of injury/illness/exacerbation are also affecting patient's functional outcome.   REHAB POTENTIAL: Fair    CLINICAL DECISION MAKING: Stable/uncomplicated  EVALUATION COMPLEXITY: Low   GOALS: Goals reviewed with patient? Yes  SHORT TERM GOALS: Target date: 08/05/22  Pt will demonstrate indep in HEP to facilitate carry-over of skilled services and improve functional outcomes Goal status: INITIAL  LONG TERM GOALS: Target date: 08/20/23  Pt will increase FOTO to at least 58 in order to demonstrate significant improvement in function related to  ADLs Baseline: 55.9 Goal status: INITIAL  2.  Pt will decrease 5TSTS by at least 3 seconds in order to demonstrate clinically significant improvement in LE strength  Baseline: 11.10 sec Goal status: INITIAL  3.  Pt will increase by at least 40 ft in order to demonstrate clinically significant improvement in community ambulation Baseline: 415 ft Goal status: INITIAL  4.  Pt will demonstrate increase in LE strength to 4-/5 to facilitate ease and safety in ambulation Baseline: 3/5 Goal status: INITIAL  PLAN:  PT FREQUENCY: 1x/week  PT DURATION: 4 weeks  PLANNED INTERVENTIONS: 97164- PT Re-evaluation, 97110-Therapeutic  exercises, 97530- Therapeutic activity, [id number]- Neuromuscular re-education, 97535- Self Care, [id number]- Manual therapy, and Patient/Family education.  PLAN FOR NEXT SESSION: Provide HEP. Begin LE flexibility, core, and strengthening activities.   Tish Frederickson. Bailea Beed, PT, DPT, OCS Board-Certified Clinical Specialist in Orthopedic PT PT Compact Privilege # ([place]): [id number] T 07/23/2023, 12:21 PM   [employer] Auth Request  Referring diagnosis code (ICD 10)? M54.50 Treatment diagnosis codes (ICD 10)? (if different than referring diagnosis) R29.898, M62.81, M54.59 What was this (referring dx) caused by? []  Surgery []  Fall []  Ongoing issue []  Arthritis [x]  Other: unknown  Laterality: []  Rt []  Lt [x]  Both  Deficits: [x]  Pain []  Stiffness [x]  Weakness []  Edema []  Balance Deficits []  Coordination []  Gait Disturbance [x]  ROM []  Other   Functional Tool Score: FOTO = 55.9  CPT codes: See Planned Interventions listed in the Plan section of the Evaluation.

## 2023-07-27 ENCOUNTER — Other Ambulatory Visit: Payer: Self-pay

## 2023-07-27 ENCOUNTER — Ambulatory Visit: Payer: Medicare PPO | Admitting: Urology

## 2023-07-27 ENCOUNTER — Other Ambulatory Visit: Payer: Medicare PPO

## 2023-07-27 DIAGNOSIS — N138 Other obstructive and reflux uropathy: Secondary | ICD-10-CM

## 2023-07-27 DIAGNOSIS — N401 Enlarged prostate with lower urinary tract symptoms: Secondary | ICD-10-CM | POA: Diagnosis not present

## 2023-07-28 LAB — PSA: Prostate Specific Ag, Serum: 4.5 ng/mL — ABNORMAL HIGH (ref 0.0–4.0)

## 2023-07-31 DIAGNOSIS — K581 Irritable bowel syndrome with constipation: Secondary | ICD-10-CM | POA: Diagnosis not present

## 2023-07-31 DIAGNOSIS — L309 Dermatitis, unspecified: Secondary | ICD-10-CM | POA: Diagnosis not present

## 2023-08-01 ENCOUNTER — Ambulatory Visit (HOSPITAL_COMMUNITY): Payer: Medicare PPO | Attending: Family Medicine

## 2023-08-01 DIAGNOSIS — R262 Difficulty in walking, not elsewhere classified: Secondary | ICD-10-CM | POA: Insufficient documentation

## 2023-08-01 DIAGNOSIS — M6281 Muscle weakness (generalized): Secondary | ICD-10-CM | POA: Diagnosis not present

## 2023-08-01 DIAGNOSIS — M5459 Other low back pain: Secondary | ICD-10-CM | POA: Diagnosis not present

## 2023-08-01 DIAGNOSIS — R29898 Other symptoms and signs involving the musculoskeletal system: Secondary | ICD-10-CM | POA: Diagnosis not present

## 2023-08-01 NOTE — Therapy (Signed)
 OUTPATIENT PHYSICAL THERAPY THORACOLUMBAR TREATMENT   Patient Name: Steven [name] MRN: [id number] DOB:16-Apr-1951, 73 y.o., male Today's Date: 08/01/2023  END OF SESSION:  PT End of Session - 08/01/23 1024     Visit Number 2    Number of Visits 4    Date for PT Re-Evaluation 08/20/23    Authorization Type [employer] Choice PPO (4 visits approved)    Authorization Time Period 08/01/23 - 08/20/23    Authorization - Visit Number 1    Authorization - Number of Visits 4    PT Start Time 1020    PT Stop Time 1100    PT Time Calculation (min) 40 min    Activity Tolerance Patient tolerated treatment well    Behavior During Therapy WFL for tasks assessed/performed             Past Medical History:  Diagnosis Date   Anxiety    Arthritis    Atrial fibrillation (HCC)    Atrial flutter (HCC)    BPH (benign prostatic hyperplasia)    Chest pain    Dizziness    Dyspnea    laying down occ   Fracture 08/17/2015   MULTIPLE RIB FRACTURES     FROM FALL    GERD (gastroesophageal reflux disease)    Hemorrhoids    History of kidney stones    noted on CT scan   History of radiation therapy 08/22/11-10/13/11   prostate   Hyperlipemia    Hypertension    Hyperthyroidism    IBS (irritable bowel syndrome)    Insomnia    Light headedness    Migraine    Numbness and tingling in left arm    Numbness and tingling of both legs    Open fracture of left elbow 08/18/2015   Prostate cancer [facility])    prostate s/p radiation Mar 2013   Rib fractures 08/17/2015   Tinnitus    Past Surgical History:  Procedure Laterality Date   BOTOX  INJECTION N/A 01/14/2020   Procedure: INJECTION OF BOTOX  INTO ANAL SPHINCTER;  Surgeon: Teresa Lonni HERO, MD;  Location: [facility];  Service: General;  Laterality: N/A;   COLONOSCOPY N/A 12/19/2012   MFM:Mzrujo 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 Normal appearing colon   COLONOSCOPY WITH PROPOFOL  N/A  10/23/2019   Procedure: COLONOSCOPY WITH PROPOFOL ;  Surgeon: Shaaron Lamar HERO, MD; external and grade 2 internal hemorrhoids, abnormal rectal blood vessels consistent with radiation proctitis s/p APC therapy, otherwise normal exam.   EVALUATION UNDER ANESTHESIA WITH ANAL FISTULECTOMY N/A 01/14/2020   Procedure: ANORECTAL EXAM UNDER ANESTHESIA;  Surgeon: Teresa Lonni HERO, MD;  Location: [facility];  Service: General;  Laterality: N/A;   HOT HEMOSTASIS  10/23/2019   Procedure: HOT HEMOSTASIS (ARGON PLASMA COAGULATION/BICAP);  Surgeon: Shaaron Lamar HERO, MD;  Location: [facility];  Service: Endoscopy;;  apc rectal proctitis     KIDNEY SURGERY  1982   kidney tube collapse repair   RADIOACTIVE SEED IMPLANT     Prostate   SVT ABLATION N/A 12/12/2022   Procedure: SVT ABLATION;  Surgeon: Waddell Danelle ORN, MD;  Location: [facility];  Service: Cardiovascular;  Laterality: N/A;   Patient Active Problem List   Diagnosis Date Noted   Headache with neurologic deficit 07/12/2023   Contact dermatitis 06/27/2023   Low back pain 06/27/2023   Upper respiratory infection 06/07/2023   Inflammatory arthritis 05/23/2023   Pain of left hip 05/11/2023  Migraine, unspecified, not intractable, without status migrainosus 05/08/2023   Depression with anxiety 04/19/2023   Mixed hyperlipidemia 03/14/2023   Prediabetes 03/14/2023   Diffuse pain 02/19/2023   Hypertensive urgency 11/24/2022   Leukopenia 11/24/2022   SVT (supraventricular tachycardia) (HCC) 11/24/2022   Orthostatic hypotension 11/23/2022   Chest tightness 08/22/2022   Hypokalemia 08/21/2022   Calculus of gallbladder without cholecystitis without obstruction 08/09/2022   Secondary adrenocortical insufficiency (HCC) 07/19/2022   Rash and nonspecific skin eruption 05/10/2022   Dysphagia 05/10/2022   Fibromyalgia 07/07/2020   Paresthesia of upper limb 03/04/2020   Carcinoma of prostate (HCC) 02/17/2020   Subacute thyroiditis 01/07/2020    Personal history of malignant neoplasm of prostate 12/12/2019   Benign prostatic hyperplasia with urinary obstruction 12/12/2019   Weak urinary stream 12/12/2019   Hyperthyroidism 11/24/2019   Unintentional weight loss 10/21/2019   IBS (irritable bowel syndrome) 10/21/2019   Dizzy spells 09/20/2019   Intractable chronic migraine without aura 09/20/2019   Sleep apnea 09/20/2019   Psychophysiologic insomnia 09/20/2019   Rectal pain 08/21/2019   Nausea without vomiting 05/30/2019   Paroxysmal atrial flutter (HCC) 04/17/2019   Chronic atrial fibrillation (HCC) 03/02/2019   HTN (hypertension) 03/02/2019   Constipation 08/02/2017   Hemorrhoids 08/02/2017   Fall 08/18/2015   Radiation proctitis 01/30/2013   Rectal bleeding 12/10/2012   Obese 05/30/2012   Dyspnea 05/30/2012   Elevated lipids 05/30/2012   Palpitations 05/30/2012   Malignant neoplasm of prostate (HCC) 08/13/2011    PCP: Terry Wilhelmena Lloyd Hilario, FNP  REFERRING PROVIDER: Terry Wilhelmena Lloyd Hilario, FNP  REFERRING DIAG: M54.50 (ICD-10-CM) - Lumbar pain  Rationale for Evaluation and Treatment: Rehabilitation  THERAPY DIAG:  Other symptoms and signs involving the musculoskeletal system  Muscle weakness (generalized)  Other low back pain  ONSET DATE: years ago  SUBJECTIVE:                                                                                                                                                                                           SUBJECTIVE STATEMENT: Doing well today and denies pain.  EVAL: Arrives to the clinic with stinging and stabbing pain randomly anywhere on the body. Sometimes the L LE can feel numb and the hips can feel weak and stiff. However, patient denies pain at the time of evaluation. Condition started years ago without apparent reason but patient fell in 2017 where he sustained multiple fx but was still able to work 3 years after. Denies any trauma recently. Imaging was done  recently (see below). Patient went to a spine specialist lately and referred him to outpatient PT evaluation and management.  PERTINENT HISTORY:  SVT ablation  PAIN:  Are you having pain? Yes: NPRS scale: 0 at present, 8-9/10 when it hurts Pain location: bottom of the feet, fingers, low back  Pain description: intermittent, very random, stinging and stabbing pain Aggravating factors: laying down and being still Relieving factors: cannot associate any relieving factor  PRECAUTIONS: None  RED FLAGS: None   WEIGHT BEARING RESTRICTIONS: No  FALLS:  Has patient fallen in last 6 months? No  LIVING ENVIRONMENT: Lives with: lives with an adult companion Lives in: House/apartment Stairs: Yes: External: 5 steps; can reach both Has following equipment at home: Single point cane  OCCUPATION: retired  PLOF: Independent and Independent with basic ADLs  PATIENT GOALS: to help with these pains  NEXT MD VISIT: unknown  OBJECTIVE:  Note: Objective measures were completed at Evaluation unless otherwise noted.  DIAGNOSTIC FINDINGS:  06/26/23 LUMBAR SPINE - COMPLETE 4+ VIEW   COMPARISON:  None Available.   FINDINGS: Five non-rib-bearing lumbar vertebra. Moderate dextroscoliotic curvature at the thoracolumbar junction. Straightening of normal lordosis. No evidence of fracture or compression deformity. Moderate anterior spurring with relative preservation of disc spaces. Minor lower lumbar facet hypertrophy. No visible pars defects or focal bone abnormality. The sacroiliac joints are congruent. Small surgical clips in the pelvis   IMPRESSION: 1. Moderate dextroscoliotic curvature at the thoracolumbar junction. 2. Moderate anterior spurring with relative preservation of disc spaces. Minor lower lumbar facet hypertrophy.    PATIENT SURVEYS:  FOTO 55.9  COGNITION: Overall cognitive status: Within functional limits for tasks assessed     SENSATION: Light touch: WFL on B  LE  MUSCLE LENGTH: Hamstrings: moderate restriction on B Thomas test: moderate restriction on B Piriformis test: moderate restriction on B  POSTURE: rounded shoulders, forward head, decreased lumbar lordosis, increased thoracic kyphosis, and knee slightly bent  PALPATION: No tenderness on major muscle mass of the lumbar spine  LUMBAR ROM:   AROM eval  Flexion 75%  Extension 50%  Right lateral flexion   Left lateral flexion   Right rotation 50%  Left rotation 50%   (Blank rows = not tested)  LOWER EXTREMITY ROM:     Active  Right eval Left eval  Hip flexion [facility]  Hip extension [facility]  Hip abduction [facility]  Hip adduction    Hip internal rotation    Hip external rotation    Knee flexion [facility] WFL  Knee extension [facility] WFL  Ankle dorsiflexion [facility] WFL  Ankle plantarflexion [facility] WFL  Ankle inversion    Ankle eversion     (Blank rows = not tested)  LOWER EXTREMITY MMT:    MMT Right eval Left eval  Hip flexion 4 4  Hip extension 3+ 3  Hip abduction 4- 4-  Hip adduction    Hip internal rotation    Hip external rotation    Knee flexion 4 4  Knee extension 4 4  Ankle dorsiflexion 4 4  Ankle plantarflexion 4 4  Ankle inversion    Ankle eversion     (Blank rows = not tested)  LUMBAR SPECIAL TESTS:  Straight leg raise test: Negative  FUNCTIONAL TESTS:  5 times sit to stand: 11.10 sec 2 minute walk test: 415 ft  GAIT: Distance walked: 415 ft Assistive device utilized: None Level of assistance: Complete Independence Comments: done during , knees slightly flexed on midstance, decreased swing phase on B  TREATMENT DATE:  08/01/23 Reviewed goals NuStep, seat 9, level 1, > 70 SPM, 5' Seated  hamstring stretch x 30 x 3 Seated piriformis stretch x 30 x 3 Bridging x 3 x 10 x 2 Sidelying clamshells x RTB x 10 x 2 Seated abdominal press with physioball, neutral spine x 10 x 2 Mini squats x 3 x 10 x 2 Standing Heel/toe raises x 10 x  2  07/23/23 Evaluation and patient education done                                                                                                                               PATIENT EDUCATION:  Education details: Educated on the pathoanatomy of low back pain. Educated on the goals and course of rehab.  Person educated: Patient Education method: Explanation Education comprehension: verbalized understanding  HOME EXERCISE PROGRAM: Access Code: [email or web address] URL: [email or web address] Date: 08/01/2023 Prepared by: Vinie Haver  Exercises - Seated Hamstring Stretch  - 1 x daily - 5 x weekly - 3 reps - 30 hold - Seated Piriformis Stretch  - 1 x daily - 5 x weekly - 3 reps - 30 hold - Supine Bridge  - 1 x daily - 5 x weekly - 2 sets - 10 reps - 3 hold - Clam with Resistance  - 1 x daily - 5 x weekly - 2 sets - 10 reps - Seated Abdominal Press into [employer]  - 1 x daily - 5 x weekly - 2 sets - 10 reps - 3 hold - Mini Squat with Counter Support  - 1 x daily - 5 x weekly - 2 sets - 10 reps - 3 hold - Heel Toe Raises with Counter Support  - 1 x daily - 5 x weekly - 2 sets - 10 reps  ASSESSMENT:  CLINICAL IMPRESSION: Interventions today were geared towards LE flexibility, core and LE strength. Tolerated all activities without worsening of symptoms except when doing mini squats where patient reported of L low back pain = 3/10. Demonstrated appropriate levels of fatigue. Provided slight amount of cueing to ensure correct execution of activity with good carry-over. To date, skilled PT is required to address the impairments and improve function.  EVAL: Patient is a 73 y.o. male who was seen today for physical therapy evaluation and treatment for lumbar pain. Patient's condition is further defined by difficulty with laying down due to pain, weakness, and decreased soft tissue extensibility. Skilled PT is required to address the impairments and functional limitations listed below.     OBJECTIVE IMPAIRMENTS: decreased activity tolerance, decreased ROM, decreased strength, impaired flexibility, and pain.   ACTIVITY LIMITATIONS: carrying, lifting, bending, sitting, standing, squatting, stairs, and bed mobility  PARTICIPATION LIMITATIONS: meal prep, cleaning, laundry, driving, shopping, community activity, and yard work  PERSONAL FACTORS: Time since onset of injury/illness/exacerbation are also affecting patient's functional outcome.   REHAB POTENTIAL: Fair    CLINICAL DECISION MAKING: Stable/uncomplicated  EVALUATION COMPLEXITY: Low   GOALS: Goals reviewed with patient? Yes  SHORT TERM GOALS: Target date: 08/05/22  Pt will demonstrate indep in HEP to facilitate carry-over of skilled services and improve functional outcomes Goal status: INITIAL  LONG TERM GOALS: Target date: 08/20/23  Pt will increase FOTO to at least 58 in order to demonstrate significant improvement in function related to  ADLs Baseline: 55.9 Goal status: INITIAL  2.  Pt will decrease 5TSTS by at least 3 seconds in order to demonstrate clinically significant improvement in LE strength  Baseline: 11.10 sec Goal status: INITIAL  3.  Pt will increase by at least 40 ft in order to demonstrate clinically significant improvement in community ambulation Baseline: 415 ft Goal status: INITIAL  4.  Pt will demonstrate increase in LE strength to 4-/5 to facilitate ease and safety in ambulation Baseline: 3/5 Goal status: INITIAL  PLAN:  PT FREQUENCY: 1x/week  PT DURATION: 4 weeks  PLANNED INTERVENTIONS: [id number]- PT Re-evaluation, 97110-Therapeutic exercises, 97530- Therapeutic activity, [id number]- Neuromuscular re-education, 97535- Self Care, [id number]- Manual therapy, and Patient/Family education.  PLAN FOR NEXT SESSION: Continue POC and may progress as tolerated with emphasis on LE flexibility, core, and strengthening activities.   Vinie CROME. Ruvim Risko, PT, DPT, OCS Board-Certified Clinical  Specialist in Orthopedic PT PT Compact Privilege # ([place]): [id number] T 08/01/2023, 10:25 AM

## 2023-08-03 ENCOUNTER — Ambulatory Visit: Payer: Medicare PPO | Admitting: Urology

## 2023-08-03 VITALS — BP 115/72 | HR 67

## 2023-08-03 DIAGNOSIS — N401 Enlarged prostate with lower urinary tract symptoms: Secondary | ICD-10-CM | POA: Diagnosis not present

## 2023-08-03 DIAGNOSIS — N138 Other obstructive and reflux uropathy: Secondary | ICD-10-CM | POA: Diagnosis not present

## 2023-08-03 DIAGNOSIS — R972 Elevated prostate specific antigen [PSA]: Secondary | ICD-10-CM

## 2023-08-03 NOTE — Progress Notes (Signed)
 08/03/2023 12:25 PM   Steven [name] [birth year] [id number]  Referring provider: Terry Wilhelmena Lloyd Hilario, FNP [phone] [address] [place],  [place] [address]  Followup elevated PSA   HPI: [name] is a 73yo here for followup for elevated PSA. PSA increased to 4.5 from 4.1 three months ago. IPSS 12 QOL 3 on uroxatral  10mg  daily. No worsening LUTS. Urine stream strong. No straining to urinate   PMH: Past Medical History:  Diagnosis Date   Anxiety    Arthritis    Atrial fibrillation (HCC)    Atrial flutter (HCC)    BPH (benign prostatic hyperplasia)    Chest pain    Dizziness    Dyspnea    laying down occ   Fracture 08/17/2015   MULTIPLE RIB FRACTURES     FROM FALL    GERD (gastroesophageal reflux disease)    Hemorrhoids    History of kidney stones    noted on CT scan   History of radiation therapy 08/22/11-10/13/11   prostate   Hyperlipemia    Hypertension    Hyperthyroidism    IBS (irritable bowel syndrome)    Insomnia    Light headedness    Migraine    Numbness and tingling in left arm    Numbness and tingling of both legs    Open fracture of left elbow 08/18/2015   Prostate cancer [facility])    prostate s/p radiation Mar 2013   Rib fractures 08/17/2015   Tinnitus     Surgical History: Past Surgical History:  Procedure Laterality Date   BOTOX  INJECTION N/A 01/14/2020   Procedure: INJECTION OF BOTOX  INTO ANAL SPHINCTER;  Surgeon: Teresa Lonni HERO, MD;  Location: [facility];  Service: General;  Laterality: N/A;   COLONOSCOPY N/A 12/19/2012   MFM:Mzrujo 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 Normal appearing colon   COLONOSCOPY WITH PROPOFOL  N/A 10/23/2019   Procedure: COLONOSCOPY WITH PROPOFOL ;  Surgeon: Shaaron Lamar HERO, MD; external and grade 2 internal hemorrhoids, abnormal rectal blood vessels consistent with radiation proctitis s/p APC therapy, otherwise normal exam.   EVALUATION UNDER ANESTHESIA WITH  ANAL FISTULECTOMY N/A 01/14/2020   Procedure: ANORECTAL EXAM UNDER ANESTHESIA;  Surgeon: Teresa Lonni HERO, MD;  Location: [facility];  Service: General;  Laterality: N/A;   HOT HEMOSTASIS  10/23/2019   Procedure: HOT HEMOSTASIS (ARGON PLASMA COAGULATION/BICAP);  Surgeon: Shaaron Lamar HERO, MD;  Location: [facility];  Service: Endoscopy;;  apc rectal proctitis     KIDNEY SURGERY  1982   kidney tube collapse repair   RADIOACTIVE SEED IMPLANT     Prostate   SVT ABLATION N/A 12/12/2022   Procedure: SVT ABLATION;  Surgeon: Steven [name] Steven [name] ORN, MD;  Location: [facility];  Service: Cardiovascular;  Laterality: N/A;    Home Medications:  Allergies as of 08/03/2023       Reactions   Latex Rash, Other (See Comments)   Possible reaction to latex gloves per patient   Meclizine Other (See Comments)   Other reaction(s): Abdominal Pain, Other        Medication List        Accurate as of August 03, 2023 12:25 PM. If you have any questions, ask your nurse or doctor.          alfuzosin  10 MG 24 hr tablet Commonly known as: UROXATRAL  Take 1 tablet (10 mg total) by mouth daily with breakfast.   Norvasc  5 MG tablet  Generic drug: amLODipine  Take 5 mg by mouth daily.   amLODipine  10 MG tablet Commonly known as: NORVASC  Take 1 tablet (10 mg total) by mouth daily.   apixaban  5 MG Tabs tablet Commonly known as: Eliquis  Take 1 tablet (5 mg total) by mouth 2 (two) times daily.   Azelastine -Fluticasone  137-50 MCG/ACT Susp Place 1 spray into the nose every 12 (twelve) hours.   cloNIDine  0.1 MG tablet Commonly known as: CATAPRES  Take 1 tablet (0.1 mg total) by mouth 2 (two) times daily as needed (SBP>160).   DULoxetine  30 MG capsule Commonly known as: Cymbalta  Take 1 capsule (30 mg total) by mouth daily.   DULoxetine  60 MG capsule Commonly known as: Cymbalta  Take 1 capsule (60 mg total) by mouth daily.   Emgality  120 MG/ML Soaj Generic drug: Galcanezumab -gnlm Inject 1 Pen into  the skin every 30 (thirty) days.   ezetimibe  10 MG tablet Commonly known as: Zetia  Take 1 tablet (10 mg total) by mouth daily.   furosemide  20 MG tablet Commonly known as: LASIX  Take 1 tablet (20 mg total) by mouth daily. Take with or after meal.   hydrOXYzine  10 MG tablet Commonly known as: ATARAX  Take 1 tablet (10 mg total) by mouth 3 (three) times daily as needed for anxiety.   hyoscyamine  0.375 MG 12 hr tablet Commonly known as: LEVBID  Take 0.375 mg by mouth 2 (two) times daily.   loratadine  10 MG tablet Commonly known as: CLARITIN  Take 10 mg by mouth daily.   loratadine  10 MG tablet Commonly known as: CLARITIN  Take 1 tablet (10 mg total) by mouth daily.   losartan  100 MG tablet Commonly known as: COZAAR  Take 100 mg by mouth daily.   losartan  100 MG tablet Commonly known as: COZAAR  Take 50 mg by mouth daily.   methimazole  5 MG tablet Commonly known as: TAPAZOLE  Take 0.5 tablets (2.5 mg total) by mouth daily.   metoprolol  tartrate 50 MG tablet Commonly known as: LOPRESSOR  Take 50 mg by mouth 2 (two) times daily.   metoprolol  tartrate 25 MG tablet Commonly known as: LOPRESSOR  Take 1 tablet (25 mg total) by mouth 2 (two) times daily. TAKE EXTRA HALF TABLET  AS NEEDED   mirtazapine  15 MG tablet Commonly known as: REMERON  Take 1 tablet (15 mg total) by mouth at bedtime.   mirtazapine  7.5 MG tablet Commonly known as: REMERON  TAKE 1 TABLET(7.5 MG) BY MOUTH AT BEDTIME   Nurtec 75 MG Tbdp Generic drug: Rimegepant Sulfate Take 75 mg by mouth daily as needed (pain).   omeprazole  20 MG capsule Commonly known as: PRILOSEC Take 20 mg by mouth daily.   ondansetron  4 MG tablet Commonly known as: Zofran  Take 1 tablet (4 mg total) by mouth every 8 (eight) hours as needed for nausea or vomiting.   MiraLax  17 g packet Generic drug: polyethylene glycol Take 17 g by mouth once.   polyethylene glycol 17 g packet Commonly known as: MIRALAX  / GLYCOLAX  Take 17 g by  mouth daily.   potassium chloride  20 MEQ packet Commonly known as: KLOR-CON  Take 20 mEq by mouth once.   potassium chloride  SA 20 MEQ tablet Commonly known as: KLOR-CON  M Take 1 tablet (20 mEq total) by mouth daily.   pregabalin  100 MG capsule Commonly known as: Lyrica  Take 1 capsule (100 mg total) by mouth 2 (two) times daily.   PROBIOTIC-10 PO Take 1 capsule by mouth daily.   saccharomyces boulardii 250 MG capsule Commonly known as: FLORASTOR Take 250 mg by mouth  2 (two) times daily.   tiZANidine  4 MG tablet Commonly known as: Zanaflex  Take 1 tablet (4 mg total) by mouth every 6 (six) hours as needed for muscle spasms.   triamcinolone  cream 0.1 % Commonly known as: KENALOG  Apply 1 Application topically 2 (two) times daily.   Tylenol  325 MG tablet Generic drug: acetaminophen  Take 325 mg by mouth every 6 (six) hours as needed.   Ubrelvy  100 MG Tabs Generic drug: Ubrogepant  Take 1 tablet (100 mg total) by mouth as needed (for headaches). May repeat a dose in 2 hours if needed. Max dose 2 pills in 24 hours   vitamin B-12 100 MCG tablet Commonly known as: CYANOCOBALAMIN  Take 100 mcg by mouth daily.   Vitamin D3 125 MCG (5000 UT) Caps Take 1 capsule by mouth daily.        Allergies:  Allergies  Allergen Reactions   Latex Rash and Other (See Comments)    Possible reaction to latex gloves per patient   Meclizine Other (See Comments)    Other reaction(s): Abdominal Pain, Other    Family History: Family History  Problem Relation Age of Onset   Aneurysm Mother        aortic   Hypertension Mother    Headache Mother    Prostate cancer Father    Hypertension Father    Heart failure Brother    Colon cancer Neg Hx    Colon polyps Neg Hx     Social History:  reports that he quit smoking about 5 years ago. His smoking use included cigarettes. He started smoking about 25 years ago. He has a 20 pack-year smoking history. He has been exposed to tobacco smoke. He  has never used smokeless tobacco. He reports that he does not currently use alcohol. He reports that he does not use drugs.  ROS: All other review of systems were reviewed and are negative except what is noted above in HPI  Physical Exam: BP 115/72   Pulse 67   Constitutional:  Alert and oriented, No acute distress. HEENT: [place] AT, moist mucus membranes.  Trachea midline, no masses. Cardiovascular: No clubbing, cyanosis, or edema. Respiratory: Normal respiratory effort, no increased work of breathing. GI: Abdomen is soft, nontender, nondistended, no abdominal masses GU: No CVA tenderness.  Lymph: No cervical or inguinal lymphadenopathy. Skin: No rashes, bruises or suspicious lesions. Neurologic: Grossly intact, no focal deficits, moving all 4 extremities. Psychiatric: Normal mood and affect.  Laboratory Data: Lab Results  Component Value Date   WBC 4.3 06/04/2023   HGB 14.2 06/04/2023   HCT 43.1 06/04/2023   MCV 88 06/04/2023   PLT 213 06/04/2023    Lab Results  Component Value Date   CREATININE 0.88 04/19/2023    Lab Results  Component Value Date   PSA 1.8 12/10/2019   PSA 2.3 11/10/2019    No results found for: TESTOSTERONE   Lab Results  Component Value Date   HGBA1C 5.7 (H) 01/29/2023    Urinalysis    Component Value Date/Time   COLORURINE STRAW (A) 01/07/2023 1953   APPEARANCEUR Clear 04/11/2023 1119   LABSPEC 1.008 01/07/2023 1953   PHURINE 7.0 01/07/2023 1953   GLUCOSEU Negative 04/11/2023 1119   HGBUR NEGATIVE 01/07/2023 1953   BILIRUBINUR Negative 04/11/2023 1119   KETONESUR NEGATIVE 01/07/2023 1953   PROTEINUR Negative 04/11/2023 1119   PROTEINUR NEGATIVE 01/07/2023 1953   UROBILINOGEN negative (A) 12/12/2019 1113   UROBILINOGEN 1.0 08/03/2012 1156   NITRITE Negative 04/11/2023 1119  NITRITE NEGATIVE 01/07/2023 1953   LEUKOCYTESUR Negative 04/11/2023 1119   LEUKOCYTESUR NEGATIVE 01/07/2023 1953    Lab Results  Component Value Date    LABMICR Comment 04/11/2023   WBCUA None seen 03/02/2020   LABEPIT None seen 03/02/2020   BACTERIA None seen 03/02/2020    Pertinent Imaging:  Results for orders placed during the hospital encounter of 11/07/19  DG Abdomen 1 View  Narrative CLINICAL DATA:  Constipation. Hemorrhoids. Irritable bowel syndrome. Prostate cancer.  EXAM: ABDOMEN - 1 VIEW  COMPARISON:  CT pelvis 09/25/2019 in CT abdomen 04/29/2019  FINDINGS: Oval-shaped densities along the midline of the upper abdomen, possibly in the stomach. The patient had gallstones on the 04/29/2019 exam but these densities appear more regular and more medially located than expected location for gallstones. There is another oval-shaped density projecting over the transverse colon and 1 along the rectum, again I suspect that these are within stomach/bowel.  The tiny punctate right kidney lower pole renal calculus shown on 04/29/2019 is not readily seen on today's conventional radiographs.  Dextroconvex lumbar scoliosis with rotary component. No dilated bowel. Bowel gas pattern appears otherwise unremarkable.  IMPRESSION: 1. Oval-shaped densities in the abdomen are thought to be in the stomach and bowel. 2. No dilated bowel.  Unremarkable bowel gas pattern. 3. Dextroconvex lumbar scoliosis with rotary component.   Electronically Signed By: Ryan Salvage M.D. On: 11/07/2019 17:35  No results found for this or any previous visit.  No results found for this or any previous visit.  No results found for this or any previous visit.  No results found for this or any previous visit.  No results found for this or any previous visit.  No results found for this or any previous visit.  No results found for this or any previous visit.   Assessment & Plan:    1. Benign prostatic hyperplasia with urinary obstruction (Primary) Continue uroxatral  10mg  qhs  2. Elevated PSA IsoPSA and MRI prostater   No follow-ups  on file.  Belvie Clara, MD  [facility] [place]

## 2023-08-08 ENCOUNTER — Ambulatory Visit (HOSPITAL_COMMUNITY): Payer: Medicare PPO

## 2023-08-08 DIAGNOSIS — M5459 Other low back pain: Secondary | ICD-10-CM

## 2023-08-08 DIAGNOSIS — M6281 Muscle weakness (generalized): Secondary | ICD-10-CM | POA: Diagnosis not present

## 2023-08-08 DIAGNOSIS — R29898 Other symptoms and signs involving the musculoskeletal system: Secondary | ICD-10-CM

## 2023-08-08 DIAGNOSIS — R262 Difficulty in walking, not elsewhere classified: Secondary | ICD-10-CM | POA: Diagnosis not present

## 2023-08-08 NOTE — Therapy (Signed)
 OUTPATIENT PHYSICAL THERAPY THORACOLUMBAR TREATMENT   Patient Name: Steven [name] MRN: [id number] DOB:1951-07-01, 73 y.o., male Today's Date: 08/08/2023  END OF SESSION:  PT End of Session - 08/08/23 1018     Visit Number 3    Number of Visits 5    Date for PT Re-Evaluation 08/20/23    Authorization Type [employer] Medicare Choice PPO (4 visits approved)    Authorization Time Period 08/01/23 - 08/20/23    Authorization - Visit Number 2    Authorization - Number of Visits 4    PT Start Time 1015    PT Stop Time 1055    PT Time Calculation (min) 40 min    Activity Tolerance Patient tolerated treatment well    Behavior During Therapy WFL for tasks assessed/performed            Past Medical History:  Diagnosis Date   Anxiety    Arthritis    Atrial fibrillation (HCC)    Atrial flutter (HCC)    BPH (benign prostatic hyperplasia)    Chest pain    Dizziness    Dyspnea    laying down occ   Fracture 08/17/2015   MULTIPLE RIB FRACTURES     FROM FALL    GERD (gastroesophageal reflux disease)    Hemorrhoids    History of kidney stones    noted on CT scan   History of radiation therapy 08/22/11-10/13/11   prostate   Hyperlipemia    Hypertension    Hyperthyroidism    IBS (irritable bowel syndrome)    Insomnia    Light headedness    Migraine    Numbness and tingling in left arm    Numbness and tingling of both legs    Open fracture of left elbow 08/18/2015   Prostate cancer [facility])    prostate s/p radiation Mar 2013   Rib fractures 08/17/2015   Tinnitus    Past Surgical History:  Procedure Laterality Date   BOTOX  INJECTION N/A 01/14/2020   Procedure: INJECTION OF BOTOX  INTO ANAL SPHINCTER;  Surgeon: Melvenia Stabs, MD;  Location: [facility];  Service: General;  Laterality: N/A;   COLONOSCOPY N/A 12/19/2012   WUJ:[id number] 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 Normal appearing colon   COLONOSCOPY WITH PROPOFOL  N/A  10/23/2019   Procedure: COLONOSCOPY WITH PROPOFOL ;  Surgeon: Suzette Espy, MD; external and grade 2 internal hemorrhoids, abnormal rectal blood vessels consistent with radiation proctitis s/p APC therapy, otherwise normal exam.   EVALUATION UNDER ANESTHESIA WITH ANAL FISTULECTOMY N/A 01/14/2020   Procedure: ANORECTAL EXAM UNDER ANESTHESIA;  Surgeon: Melvenia Stabs, MD;  Location: [facility];  Service: General;  Laterality: N/A;   HOT HEMOSTASIS  10/23/2019   Procedure: HOT HEMOSTASIS (ARGON PLASMA COAGULATION/BICAP);  Surgeon: Suzette Espy, MD;  Location: [facility];  Service: Endoscopy;;  apc rectal proctitis     KIDNEY SURGERY  1982   kidney tube collapse repair   RADIOACTIVE SEED IMPLANT     Prostate   SVT ABLATION N/A 12/12/2022   Procedure: SVT ABLATION;  Surgeon: Tammie Fall, MD;  Location: [facility];  Service: Cardiovascular;  Laterality: N/A;   Patient Active Problem List   Diagnosis Date Noted   Headache with neurologic deficit 07/12/2023   Contact dermatitis 06/27/2023   Low back pain 06/27/2023   Upper respiratory infection 06/07/2023   Inflammatory arthritis 05/23/2023   Pain of left hip 05/11/2023   Migraine,  unspecified, not intractable, without status migrainosus 05/08/2023   Depression with anxiety 04/19/2023   Mixed hyperlipidemia 03/14/2023   Prediabetes 03/14/2023   Diffuse pain 02/19/2023   Hypertensive urgency 11/24/2022   Leukopenia 11/24/2022   SVT (supraventricular tachycardia) (HCC) 11/24/2022   Orthostatic hypotension 11/23/2022   Chest tightness 08/22/2022   Hypokalemia 08/21/2022   Calculus of gallbladder without cholecystitis without obstruction 08/09/2022   Secondary adrenocortical insufficiency (HCC) 07/19/2022   Rash and nonspecific skin eruption 05/10/2022   Dysphagia 05/10/2022   Fibromyalgia 07/07/2020   Paresthesia of upper limb 03/04/2020   Carcinoma of prostate (HCC) 02/17/2020   Subacute thyroiditis 01/07/2020    Personal history of malignant neoplasm of prostate 12/12/2019   Benign prostatic hyperplasia with urinary obstruction 12/12/2019   Weak urinary stream 12/12/2019   Hyperthyroidism 11/24/2019   Unintentional weight loss 10/21/2019   IBS (irritable bowel syndrome) 10/21/2019   Dizzy spells 09/20/2019   Intractable chronic migraine without aura 09/20/2019   Sleep apnea 09/20/2019   Psychophysiologic insomnia 09/20/2019   Rectal pain 08/21/2019   Nausea without vomiting 05/30/2019   Paroxysmal atrial flutter (HCC) 04/17/2019   Chronic atrial fibrillation (HCC) 03/02/2019   HTN (hypertension) 03/02/2019   Constipation 08/02/2017   Hemorrhoids 08/02/2017   Fall 08/18/2015   Radiation proctitis 01/30/2013   Rectal bleeding 12/10/2012   Obese 05/30/2012   Dyspnea 05/30/2012   Elevated lipids 05/30/2012   Palpitations 05/30/2012   Malignant neoplasm of prostate (HCC) 08/13/2011    PCP: Rosanna Comment, FNP  REFERRING PROVIDER: Rosanna Comment, FNP  REFERRING DIAG: M54.50 (ICD-10-CM) - Lumbar pain  Rationale for Evaluation and Treatment: Rehabilitation  THERAPY DIAG:  Other low back pain  Other symptoms and signs involving the musculoskeletal system  ONSET DATE: years ago  SUBJECTIVE:                                                                                                                                                                                           SUBJECTIVE STATEMENT: Patient denies pain and is doing well today. Patient has been doing his HEP without any issues.  EVAL: Arrives to the clinic with stinging and stabbing pain randomly anywhere on the body. Sometimes the L LE can feel numb and the hips can feel weak and stiff. However, patient denies pain at the time of evaluation. Condition started years ago without apparent reason but patient fell in 2017 where he sustained multiple fx but was still able to work 3 years after. Denies any trauma  recently. Imaging was done recently (see below). Patient went to a spine specialist lately and referred him  to outpatient PT evaluation and management.  PERTINENT HISTORY:  SVT ablation  PAIN:  Are you having pain? Yes: NPRS scale: 0 at present, 8-9/10 when it hurts Pain location: bottom of the feet, fingers, low back  Pain description: intermittent, very random, stinging and stabbing pain Aggravating factors: laying down and being still Relieving factors: cannot associate any relieving factor  PRECAUTIONS: None  RED FLAGS: None   WEIGHT BEARING RESTRICTIONS: No  FALLS:  Has patient fallen in last 6 months? No  LIVING ENVIRONMENT: Lives with: lives with an adult companion Lives in: House/apartment Stairs: Yes: External: 5 steps; can reach both Has following equipment at home: Single point cane  OCCUPATION: retired  PLOF: Independent and Independent with basic ADLs  PATIENT GOALS: "to help with these pains"  NEXT MD VISIT: unknown  OBJECTIVE:  Note: Objective measures were completed at Evaluation unless otherwise noted.  DIAGNOSTIC FINDINGS:  06/26/23 LUMBAR SPINE - COMPLETE 4+ VIEW   COMPARISON:  None Available.   FINDINGS: Five non-rib-bearing lumbar vertebra. Moderate dextroscoliotic curvature at the thoracolumbar junction. Straightening of normal lordosis. No evidence of fracture or compression deformity. Moderate anterior spurring with relative preservation of disc spaces. Minor lower lumbar facet hypertrophy. No visible pars defects or focal bone abnormality. The sacroiliac joints are congruent. Small surgical clips in the pelvis   IMPRESSION: 1. Moderate dextroscoliotic curvature at the thoracolumbar junction. 2. Moderate anterior spurring with relative preservation of disc spaces. Minor lower lumbar facet hypertrophy.    PATIENT SURVEYS:  FOTO 55.9  COGNITION: Overall cognitive status: Within functional limits for tasks  assessed     SENSATION: Light touch: WFL on B LE  MUSCLE LENGTH: Hamstrings: moderate restriction on B Thomas test: moderate restriction on B Piriformis test: moderate restriction on B  POSTURE: rounded shoulders, forward head, decreased lumbar lordosis, increased thoracic kyphosis, and knee slightly bent  PALPATION: No tenderness on major muscle mass of the lumbar spine  LUMBAR ROM:   AROM eval  Flexion 75%  Extension 50%  Right lateral flexion   Left lateral flexion   Right rotation 50%  Left rotation 50%   (Blank rows = not tested)  LOWER EXTREMITY ROM:     Active  Right eval Left eval  Hip flexion [facility]  Hip extension [facility]  Hip abduction [facility]  Hip adduction    Hip internal rotation    Hip external rotation    Knee flexion [facility] WFL  Knee extension [facility] WFL  Ankle dorsiflexion [facility] WFL  Ankle plantarflexion [facility] WFL  Ankle inversion    Ankle eversion     (Blank rows = not tested)  LOWER EXTREMITY MMT:    MMT Right eval Left eval  Hip flexion 4 4  Hip extension 3+ 3  Hip abduction 4- 4-  Hip adduction    Hip internal rotation    Hip external rotation    Knee flexion 4 4  Knee extension 4 4  Ankle dorsiflexion 4 4  Ankle plantarflexion 4 4  Ankle inversion    Ankle eversion     (Blank rows = not tested)  LUMBAR SPECIAL TESTS:  Straight leg raise test: Negative  FUNCTIONAL TESTS:  5 times sit to stand: 11.10 sec 2 minute walk test: 415 ft  GAIT: Distance walked: 415 ft Assistive device utilized: None Level of assistance: Complete Independence Comments: done during , knees slightly flexed on midstance, decreased swing phase on B  TREATMENT DATE:  08/08/23 NuStep, seat 9, level 2, >  70 SPM, 5' Seated hamstring stretch x 30" x 3 Seated piriformis stretch x 30" x 3 Seated hip flexor stretch x 30" x 2 Bridging with hip abd, RTB x 3" x 10 x 2 Shoulder ext, RTB x 3" x 10 x 2 Mini squats with yellow med ball on chest x 3" x 10 x  2 Standing Heel raises with yellow med ball on chest x 10 x 2  08/01/23 Reviewed goals NuStep, seat 9, level 1, > 70 SPM, 5' Seated hamstring stretch x 30" x 3 Seated piriformis stretch x 30" x 3 Bridging x 3" x 10 x 2 Sidelying clamshells x RTB x 10 x 2 Seated abdominal press with physioball, neutral spine x 10 x 2 Mini squats x 3" x 10 x 2 Standing Heel/toe raises x 10 x 2  07/23/23 Evaluation and patient education done                                                                                                                               PATIENT EDUCATION:  Education details: Educated on the pathoanatomy of low back pain. Educated on the goals and course of rehab.  Person educated: Patient Education method: Explanation Education comprehension: verbalized understanding  HOME EXERCISE PROGRAM: Access Code: [email or web address] URL: [email or web address] 08/08/2023 - Seated Hip Flexor Stretch  - 1 x daily - 5 x weekly - 3 sets - 10 reps - 30 hold  Date: 08/01/2023 Prepared by: Juline Ohara  Exercises - Seated Hamstring Stretch  - 1 x daily - 5 x weekly - 3 reps - 30 hold - Seated Piriformis Stretch  - 1 x daily - 5 x weekly - 3 reps - 30 hold - Supine Bridge  - 1 x daily - 5 x weekly - 2 sets - 10 reps - 3 hold - Clam with Resistance  - 1 x daily - 5 x weekly - 2 sets - 10 reps - Seated Abdominal Press into [employer]  - 1 x daily - 5 x weekly - 2 sets - 10 reps - 3 hold - Mini Squat with Counter Support  - 1 x daily - 5 x weekly - 2 sets - 10 reps - 3 hold - Heel Toe Raises with Counter Support  - 1 x daily - 5 x weekly - 2 sets - 10 reps  ASSESSMENT:  CLINICAL IMPRESSION: Interventions today were geared towards LE flexibility, core and LE strength. Tolerated all activities without worsening of symptoms except when doing mini squats where patient still reported of L low back pain = 2-3/10. Demonstrated appropriate levels of fatigue. Provided slight amount of  cueing to ensure correct execution of activity with good carry-over. To date, skilled PT is required to address the impairments and improve function.  EVAL: Patient is a 73 y.o. male who was seen today for physical therapy evaluation and treatment for lumbar pain. Patient's condition is further defined by difficulty with laying down  due to pain, weakness, and decreased soft tissue extensibility. Skilled PT is required to address the impairments and functional limitations listed below.    OBJECTIVE IMPAIRMENTS: decreased activity tolerance, decreased ROM, decreased strength, impaired flexibility, and pain.   ACTIVITY LIMITATIONS: carrying, lifting, bending, sitting, standing, squatting, stairs, and bed mobility  PARTICIPATION LIMITATIONS: meal prep, cleaning, laundry, driving, shopping, community activity, and yard work  PERSONAL FACTORS: Time since onset of injury/illness/exacerbation are also affecting patient's functional outcome.   REHAB POTENTIAL: Fair    CLINICAL DECISION MAKING: Stable/uncomplicated  EVALUATION COMPLEXITY: Low   GOALS: Goals reviewed with patient? Yes  SHORT TERM GOALS: Target date: 08/05/22  Pt will demonstrate indep in HEP to facilitate carry-over of skilled services and improve functional outcomes Goal status: INITIAL  LONG TERM GOALS: Target date: 08/20/23  Pt will increase FOTO to at least 58 in order to demonstrate significant improvement in function related to  ADLs Baseline: 55.9 Goal status: INITIAL  2.  Pt will decrease 5TSTS by at least 3 seconds in order to demonstrate clinically significant improvement in LE strength  Baseline: 11.10 sec Goal status: INITIAL  3.  Pt will increase by at least 40 ft in order to demonstrate clinically significant improvement in community ambulation Baseline: 415 ft Goal status: INITIAL  4.  Pt will demonstrate increase in LE strength to 4-/5 to facilitate ease and safety in ambulation Baseline: 3/5 Goal  status: INITIAL  PLAN:  PT FREQUENCY: 1x/week  PT DURATION: 4 weeks  PLANNED INTERVENTIONS: 97164- PT Re-evaluation, 97110-Therapeutic exercises, 97530- Therapeutic activity, [id number]- Neuromuscular re-education, 97535- Self Care, 40981- Manual therapy, and Patient/Family education.  PLAN FOR NEXT SESSION: Continue POC and may progress as tolerated with emphasis on LE flexibility, core, and strengthening activities.   Bonita Bussing. Edison Wollschlager, PT, DPT, OCS Board-Certified Clinical Specialist in Orthopedic PT PT Compact Privilege # ([place]): [id number] T 08/08/2023, 10:23 AM

## 2023-08-09 ENCOUNTER — Ambulatory Visit: Payer: Self-pay | Admitting: *Deleted

## 2023-08-09 ENCOUNTER — Encounter: Payer: Self-pay | Admitting: Urology

## 2023-08-09 NOTE — Patient Outreach (Signed)
Care Coordination   Follow Up Visit Note   08/09/2023  Name: Steven [name] MRN: [id number] DOB: [birth year]  Steven [name] is a 73 y.o. year old male who sees Del Newman Nip, [place], FNP for primary care. I spoke with Steven [name] by phone today.  What matters to the patients health and wellness today?  Receive Counseling & Supportive Services for Symptoms of Anxiety.    Goals Addressed               This Visit's Progress     COMPLETED: Receive Counseling & Supportive Services for Symptoms of Anxiety. (pt-stated)   On track     Care Coordination Interventions:  Interventions Today    Flowsheet Row Most Recent Value  Chronic Disease   Chronic disease during today's visit Atrial Fibrillation (AFib), Hypertension (HTN), Other  [Diffuse Pain, Recurrent Depression, History of Falls & Fall Risk, Dizzy Spells, Fibromyalgia, Sleep Apnea, Dysphagia, Prediabetes, Obesity, Post Nasal Drip, Nausea.]  General Interventions   General Interventions Discussed/Reviewed General Interventions Discussed, General Interventions Reviewed, [employer], Level of Care, Communication with, Vaccines, Doctor Visits, Health Screening  [Encouraged Routine Engagement with Care Team Members.]  Vaccines COVID-19, Flu, Pneumonia, RSV, Shingles, Tetanus/Pertussis/Diphtheria  [Encouraged Annual Vaccinations.]  Doctor Visits Discussed/Reviewed Doctor Visits Discussed, Specialist, Doctor Visits Reviewed, Annual Wellness Visits, PCP  [Encouraged Routine Engagement with Care Team Members.]  Health Screening Bone Density, Colonoscopy, Prostate  [Encouraged Annual Health Screenings.]  PCP/Specialist Visits Compliance with follow-up visit  [Encouraged Routine Engagement with Care Team Members.]  Communication with PCP/Specialists, RN, Pharmacists, Social Work  [employer] Routine Engagement with Care Team Members.]  Level of Care Adult Daycare, Air traffic controller, Assisted Living, Skilled Nursing Facility  [Confirmed  Disinterest in Enrollment in Adult Day Care Program or Receiving Assistance Pursuing Higher Level of Care Placement Options (I.e Assisted Living Versus Skilled Nursing Facility).]  Applications Medicaid, Personal Care Services  [Confirmed Disinterest in Applying for Medicaid or Personal Care Services.]  Exercise Interventions   Exercise Discussed/Reviewed Exercise Discussed, Assistive device use and maintanence, Exercise Reviewed, Physical Activity, Weight Managment  [Encouraged Daily Exercise Regimen, as Tolerated.]  Physical Activity Discussed/Reviewed Physical Activity Discussed, Home Exercise Program (HEP), Physical Activity Reviewed, PREP, Gym, Types of exercise  [Encouraged Increased Level of Activity & Exercise, Inside & Outside the Home.]  Weight Management Weight loss  [Encouraged Healthy Diet & Drink Cathleen Fears of Fluids.]  Education Interventions   Education Provided Provided Education  [employer] Reviewed Educational Material & Entertained Questions to Ensure Understanding.]  Provided Verbal Education On Nutrition, Mental Health/Coping with Illness, When to see the doctor, Air traffic controller, [employer], [employer], Medication, Exercise  [Encouraged Consideration & Implementation of Educational Material.]  Ship broker, Personal Care Services  [Confirmed Disinterest in Applying for [employer] or Personal Care Services.]  Mental Health Interventions   Mental Health Discussed/Reviewed Mental Health Discussed, Anxiety, Depression, Grief and Loss, Mental Health Reviewed, Substance Abuse, Coping Strategies, Suicide, Crisis, Other  [Assessed Mental Health & Cognitive Status.]  Nutrition Interventions   Nutrition Discussed/Reviewed Nutrition Discussed, Nutrition Reviewed, Carbohydrate meal planning, Increasing proteins, Adding fruits and vegetables, Decreasing fats, Fluid intake, Decreasing salt, Portion sizes, Decreasing sugar intake  [Encouraged Heart-Healthy, Reduced Fat, Low Sodium,  Fiber-Rich, Decreased Sugar Diet.]  Pharmacy Interventions   Pharmacy Dicussed/Reviewed Pharmacy Topics Discussed, Medications and their functions, Medication Adherence, Pharmacy Topics Reviewed, Affording Medications  [Confirmed Ability to Afford Prescription Medications.]  Medication Adherence --  [Confirmed Compliance with Prescription Medications.]  Safety Interventions   Safety Discussed/Reviewed Safety  Discussed, Safety Reviewed, Fall Risk, Home Safety  [Encouraged Consideration of Home Safety Evaluation. Encouraged Routine Use of Assistive Devices.]  Home Safety Assistive Devices, Contact provider for referral to PT/OT, Need for home safety assessment  [Encouraged Consideration of Home Health Physical & Occupational Therapy Services.]  Advanced Directive Interventions   Advanced Directives Discussed/Reviewed Advanced Directives Discussed, Advanced Directives Reviewed  [Encouraged Initiation of Advanced Directives (Living Will & Healthcare Power of Attorney), Offering to [employer], Assist with Completion, Make Copies & Scan into Electronic Medical Record in Epic.]      Active Listening & Reflection Utilized.  Verbalization of Feelings Encouraged.  Emotional Support Provided. Acceptance & Commitment Therapy Performed. Cognitive Behavioral Therapy Conducted. Client-Centered Therapy Initiated. Encouraged Engagement with Danford Bad, Licensed Clinical Social Worker with [facility] [phone]), if You Have Questions, Need Assistance, or If Additional Social Work Needs Are Identified in The Near Future.       SDOH assessments and interventions completed:  Yes.  Care Coordination Interventions:  Yes, provided.   Follow up plan: No further intervention required.   Encounter Outcome:  Patient Visit Completed.   Danford Bad, BSW, MSW, LCSW  [facility], [facility] Clinical Social Worker II Direct Dial: [phone]  Fax:  [phone] Mailing Address: [facility] [address]  [place]  [address] Website: [email or web address]

## 2023-08-09 NOTE — Patient Instructions (Signed)

## 2023-08-09 NOTE — Patient Instructions (Signed)
Visit Information  Thank you for taking time to visit with me today. Please don't hesitate to contact me if I can be of assistance to you.   Following are the goals we discussed today:   Goals Addressed               This Visit's Progress     COMPLETED: Receive Counseling & Supportive Services for Symptoms of Anxiety. (pt-stated)   On track     Care Coordination Interventions:  Interventions Today    Flowsheet Row Most Recent Value  Chronic Disease   Chronic disease during today's visit Atrial Fibrillation (AFib), Hypertension (HTN), Other  [Diffuse Pain, Recurrent Depression, History of Falls & Fall Risk, Dizzy Spells, Fibromyalgia, Sleep Apnea, Dysphagia, Prediabetes, Obesity, Post Nasal Drip, Nausea.]  General Interventions   General Interventions Discussed/Reviewed General Interventions Discussed, General Interventions Reviewed, [employer], Level of Care, Communication with, Vaccines, Doctor Visits, Health Screening  [Encouraged Routine Engagement with Care Team Members.]  Vaccines COVID-19, Flu, Pneumonia, RSV, Shingles, Tetanus/Pertussis/Diphtheria  [Encouraged Annual Vaccinations.]  Doctor Visits Discussed/Reviewed Doctor Visits Discussed, Specialist, Doctor Visits Reviewed, Annual Wellness Visits, PCP  [Encouraged Routine Engagement with Care Team Members.]  Health Screening Bone Density, Colonoscopy, Prostate  [Encouraged Annual Health Screenings.]  PCP/Specialist Visits Compliance with follow-up visit  [Encouraged Routine Engagement with Care Team Members.]  Communication with PCP/Specialists, RN, Pharmacists, Social Work  [employer] Routine Engagement with Care Team Members.]  Level of Care Adult Daycare, Air traffic controller, Assisted Living, Skilled Nursing Facility  [Confirmed Disinterest in Enrollment in Adult Day Care Program or Receiving Assistance Pursuing Higher Level of Care Placement Options (I.e Assisted Living Versus Skilled Nursing Facility).]  Applications  Medicaid, Personal Care Services  [Confirmed Disinterest in Applying for Medicaid or Personal Care Services.]  Exercise Interventions   Exercise Discussed/Reviewed Exercise Discussed, Assistive device use and maintanence, Exercise Reviewed, Physical Activity, Weight Managment  [Encouraged Daily Exercise Regimen, as Tolerated.]  Physical Activity Discussed/Reviewed Physical Activity Discussed, Home Exercise Program (HEP), Physical Activity Reviewed, PREP, Gym, Types of exercise  [Encouraged Increased Level of Activity & Exercise, Inside & Outside the Home.]  Weight Management Weight loss  [Encouraged Healthy Diet & Drink Cathleen Fears of Fluids.]  Education Interventions   Education Provided Provided Education  [employer] Reviewed Educational Material & Entertained Questions to Ensure Understanding.]  Provided Verbal Education On Nutrition, Mental Health/Coping with Illness, When to see the doctor, Air traffic controller, [employer], [employer], Medication, Exercise  [Encouraged Consideration & Implementation of Educational Material.]  Ship broker, Personal Care Services  [Confirmed Disinterest in Applying for [employer] or Personal Care Services.]  Mental Health Interventions   Mental Health Discussed/Reviewed Mental Health Discussed, Anxiety, Depression, Grief and Loss, Mental Health Reviewed, Substance Abuse, Coping Strategies, Suicide, Crisis, Other  [Assessed Mental Health & Cognitive Status.]  Nutrition Interventions   Nutrition Discussed/Reviewed Nutrition Discussed, Nutrition Reviewed, Carbohydrate meal planning, Increasing proteins, Adding fruits and vegetables, Decreasing fats, Fluid intake, Decreasing salt, Portion sizes, Decreasing sugar intake  [Encouraged Heart-Healthy, Reduced Fat, Low Sodium, Fiber-Rich, Decreased Sugar Diet.]  Pharmacy Interventions   Pharmacy Dicussed/Reviewed Pharmacy Topics Discussed, Medications and their functions, Medication Adherence, Pharmacy Topics  Reviewed, Affording Medications  [Confirmed Ability to Afford Prescription Medications.]  Medication Adherence --  [Confirmed Compliance with Prescription Medications.]  Safety Interventions   Safety Discussed/Reviewed Safety Discussed, Safety Reviewed, Fall Risk, Home Safety  [Encouraged Consideration of Home Safety Evaluation. Encouraged Routine Use of Assistive Devices.]  Home Safety Assistive Devices, Contact provider for referral to PT/OT, Need for home safety  assessment  [Encouraged Consideration of Home Health Physical & Occupational Therapy Services.]  Advanced Directive Interventions   Advanced Directives Discussed/Reviewed Advanced Directives Discussed, Advanced Directives Reviewed  [Encouraged Initiation of Advanced Directives (Living Will & Healthcare Power of Attorney), Offering to [employer], Assist with Completion, Make Copies & Scan into Electronic Medical Record in Epic.]      Active Listening & Reflection Utilized.  Verbalization of Feelings Encouraged.  Emotional Support Provided. Acceptance & Commitment Therapy Performed. Cognitive Behavioral Therapy Conducted. Client-Centered Therapy Initiated. Encouraged Engagement with Danford Bad, Licensed Clinical Social Worker with [facility] [phone]), if You Have Questions, Need Assistance, or If Additional Social Work Needs Are Identified in The Near Future.       Please call the care guide team at [phone] if you need to cancel or reschedule your appointment.   If you are experiencing a Mental Health or Behavioral Health Crisis or need someone to talk to, please call the Suicide and Crisis Lifeline: 988 call the [place] National Suicide Prevention Lifeline: 918-850-8345 or TTY: (352)750-3339 TTY 2540390348) to talk to a trained counselor call 1-800-273-TALK (toll free, 24 hour hotline) go to [facility] [address] [place] [phone]) call the  [facility] Crisis Line: [phone] call 911  Patient verbalizes understanding of instructions and care plan provided today and agrees to view in MyChart. Active MyChart status and patient understanding of how to access instructions and care plan via MyChart confirmed with patient.     No further follow up required.  Danford Bad, BSW, MSW, LCSW  [facility], [facility] Clinical Social Worker II Direct Dial: [phone]  Fax: [phone] Mailing Address: [facility] [address]  [place]  [address] Website: [email or web address]

## 2023-08-10 ENCOUNTER — Telehealth: Payer: Self-pay | Admitting: Urology

## 2023-08-10 NOTE — Telephone Encounter (Signed)
States MRI was too closed in for him. Wants to know if provider can recommend something else.

## 2023-08-10 NOTE — Telephone Encounter (Signed)
Patient is Claustrophobic and would like to know is there any thing that can be prescribe or is there another scan that could be done. Patient is aware a message will be sent to the provider on advisement. Patient voiced understanding

## 2023-08-13 ENCOUNTER — Telehealth: Payer: Self-pay

## 2023-08-13 NOTE — Telephone Encounter (Signed)
Pt called because he doesn't want to get a MRI done do to his claustrophobia asked is there another machine we could use to get the MRI done. Pt stressed he really wanted the open MRI we let him know that open MRI can be down, but we'd have to resubmit everything through the insurance first and wait on a available date.  Asked Pt would he be okay with taking a valium before hand if not he'd have to reschedule new MRI when approval comes back. Pt agreed to take scripted valium before his MRI.

## 2023-08-14 ENCOUNTER — Telehealth: Payer: Self-pay

## 2023-08-14 ENCOUNTER — Other Ambulatory Visit: Payer: Self-pay | Admitting: Urology

## 2023-08-14 ENCOUNTER — Ambulatory Visit (HOSPITAL_COMMUNITY)
Admission: RE | Admit: 2023-08-14 | Discharge: 2023-08-14 | Disposition: A | Discharge status: Home / Self Care | Payer: Medicare PPO | Source: Ambulatory Visit | Attending: Urology | Admitting: Urology

## 2023-08-14 DIAGNOSIS — R972 Elevated prostate specific antigen [PSA]: Secondary | ICD-10-CM | POA: Diagnosis not present

## 2023-08-14 DIAGNOSIS — F418 Other specified anxiety disorders: Secondary | ICD-10-CM

## 2023-08-14 DIAGNOSIS — N134 Hydroureter: Secondary | ICD-10-CM | POA: Diagnosis not present

## 2023-08-14 DIAGNOSIS — N400 Enlarged prostate without lower urinary tract symptoms: Secondary | ICD-10-CM | POA: Diagnosis not present

## 2023-08-14 DIAGNOSIS — N4289 Other specified disorders of prostate: Secondary | ICD-10-CM | POA: Diagnosis not present

## 2023-08-14 MED ORDER — DIAZEPAM 2 MG PO TABS: ORAL_TABLET | ORAL | 0 refills | Status: DC

## 2023-08-14 MED ORDER — DIAZEPAM 10 MG PO TABS
10.0000 mg | ORAL_TABLET | Freq: Once | ORAL | 0 refills | Status: AC
Start: 2023-08-14 — End: 2023-08-14

## 2023-08-14 MED ORDER — GADOBUTROL 1 MMOL/ML IV SOLN
10.0000 mL | Freq: Once | INTRAVENOUS | Status: AC | PRN
  Administered 2023-08-14: 10 mL via INTRAVENOUS

## 2023-08-14 NOTE — Telephone Encounter (Signed)
Valium sent to pharmacy via Evette Georges, FNP

## 2023-08-14 NOTE — Telephone Encounter (Signed)
Patient was made aware that Sarah sent in Valium and voiced understanding.

## 2023-08-14 NOTE — Telephone Encounter (Signed)
Patient called in today about MRI and he needs a valium. Patient is aware Maralyn Sago will send him in a valium.Patient voiced understanding.

## 2023-08-15 ENCOUNTER — Ambulatory Visit (HOSPITAL_COMMUNITY): Payer: Medicare PPO

## 2023-08-15 DIAGNOSIS — M6281 Muscle weakness (generalized): Secondary | ICD-10-CM

## 2023-08-15 DIAGNOSIS — R29898 Other symptoms and signs involving the musculoskeletal system: Secondary | ICD-10-CM | POA: Diagnosis not present

## 2023-08-15 DIAGNOSIS — R262 Difficulty in walking, not elsewhere classified: Secondary | ICD-10-CM

## 2023-08-15 DIAGNOSIS — M5459 Other low back pain: Secondary | ICD-10-CM | POA: Diagnosis not present

## 2023-08-15 NOTE — Therapy (Signed)
OUTPATIENT PHYSICAL THERAPY THORACOLUMBAR TREATMENT   Patient Name: Steven [name] MRN: [id number] DOB:1950-09-27, 73 y.o., male Today's Date: 08/15/2023  END OF SESSION:  PT End of Session - 08/15/23 1016     Visit Number 4    Number of Visits 5    Date for PT Re-Evaluation 08/20/23    Authorization Type [employer] Medicare Choice PPO (4 visits approved)    Authorization Time Period 08/01/23 - 08/20/23    Authorization - Visit Number 3    Authorization - Number of Visits 4    PT Start Time 1015    PT Stop Time 1055    PT Time Calculation (min) 40 min    Activity Tolerance Patient tolerated treatment well    Behavior During Therapy WFL for tasks assessed/performed             Past Medical History:  Diagnosis Date   Anxiety    Arthritis    Atrial fibrillation (HCC)    Atrial flutter (HCC)    BPH (benign prostatic hyperplasia)    Chest pain    Dizziness    Dyspnea    laying down occ   Fracture 08/17/2015   MULTIPLE RIB FRACTURES     FROM FALL    GERD (gastroesophageal reflux disease)    Hemorrhoids    History of kidney stones    noted on CT scan   History of radiation therapy 08/22/11-10/13/11   prostate   Hyperlipemia    Hypertension    Hyperthyroidism    IBS (irritable bowel syndrome)    Insomnia    Light headedness    Migraine    Numbness and tingling in left arm    Numbness and tingling of both legs    Open fracture of left elbow 08/18/2015   Prostate cancer [facility])    prostate s/p radiation Mar 2013   Rib fractures 08/17/2015   Tinnitus    Past Surgical History:  Procedure Laterality Date   BOTOX INJECTION N/A 01/14/2020   Procedure: INJECTION OF BOTOX INTO ANAL SPHINCTER;  Surgeon: Andria Meuse, MD;  Location: [facility] ORS;  Service: General;  Laterality: N/A;   COLONOSCOPY N/A 12/19/2012   NWG:NFAOZH 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 Normal appearing colon   COLONOSCOPY WITH PROPOFOL N/A  10/23/2019   Procedure: COLONOSCOPY WITH PROPOFOL;  Surgeon: Corbin Ade, MD; external and grade 2 internal hemorrhoids, abnormal rectal blood vessels consistent with radiation proctitis s/p APC therapy, otherwise normal exam.   EVALUATION UNDER ANESTHESIA WITH ANAL FISTULECTOMY N/A 01/14/2020   Procedure: ANORECTAL EXAM UNDER ANESTHESIA;  Surgeon: Andria Meuse, MD;  Location: [facility] ORS;  Service: General;  Laterality: N/A;   HOT HEMOSTASIS  10/23/2019   Procedure: HOT HEMOSTASIS (ARGON PLASMA COAGULATION/BICAP);  Surgeon: Corbin Ade, MD;  Location: [facility];  Service: Endoscopy;;  apc rectal proctitis     KIDNEY SURGERY  1982   kidney tube collapse repair   RADIOACTIVE SEED IMPLANT     Prostate   SVT ABLATION N/A 12/12/2022   Procedure: SVT ABLATION;  Surgeon: Marinus Maw, MD;  Location: [facility] INVASIVE CV LAB;  Service: Cardiovascular;  Laterality: N/A;   Patient Active Problem List   Diagnosis Date Noted   Headache with neurologic deficit 07/12/2023   Contact dermatitis 06/27/2023   Low back pain 06/27/2023   Upper respiratory infection 06/07/2023   Inflammatory arthritis 05/23/2023   Pain of left hip 05/11/2023  Migraine, unspecified, not intractable, without status migrainosus 05/08/2023   Depression with anxiety 04/19/2023   Mixed hyperlipidemia 03/14/2023   Prediabetes 03/14/2023   Diffuse pain 02/19/2023   Hypertensive urgency 11/24/2022   Leukopenia 11/24/2022   SVT (supraventricular tachycardia) (HCC) 11/24/2022   Orthostatic hypotension 11/23/2022   Chest tightness 08/22/2022   Hypokalemia 08/21/2022   Calculus of gallbladder without cholecystitis without obstruction 08/09/2022   Secondary adrenocortical insufficiency (HCC) 07/19/2022   Rash and nonspecific skin eruption 05/10/2022   Dysphagia 05/10/2022   Fibromyalgia 07/07/2020   Paresthesia of upper limb 03/04/2020   Carcinoma of prostate (HCC) 02/17/2020   Subacute thyroiditis 01/07/2020    Personal history of malignant neoplasm of prostate 12/12/2019   Benign prostatic hyperplasia with urinary obstruction 12/12/2019   Weak urinary stream 12/12/2019   Hyperthyroidism 11/24/2019   Unintentional weight loss 10/21/2019   IBS (irritable bowel syndrome) 10/21/2019   Dizzy spells 09/20/2019   Intractable chronic migraine without aura 09/20/2019   Sleep apnea 09/20/2019   Psychophysiologic insomnia 09/20/2019   Rectal pain 08/21/2019   Nausea without vomiting 05/30/2019   Paroxysmal atrial flutter (HCC) 04/17/2019   Chronic atrial fibrillation (HCC) 03/02/2019   HTN (hypertension) 03/02/2019   Constipation 08/02/2017   Hemorrhoids 08/02/2017   Fall 08/18/2015   Radiation proctitis 01/30/2013   Rectal bleeding 12/10/2012   Obese 05/30/2012   Dyspnea 05/30/2012   Elevated lipids 05/30/2012   Palpitations 05/30/2012   Malignant neoplasm of prostate (HCC) 08/13/2011    PCP: Rica Records, FNP  REFERRING PROVIDER: Rica Records, FNP  REFERRING DIAG: M54.50 (ICD-10-CM) - Lumbar pain  Rationale for Evaluation and Treatment: Rehabilitation  THERAPY DIAG:  Other low back pain  Other symptoms and signs involving the musculoskeletal system  Muscle weakness (generalized)  Difficulty in walking, not elsewhere classified  ONSET DATE: years ago  SUBJECTIVE:                                                                                                                                                                                           SUBJECTIVE STATEMENT: Patient denies pain and is doing well today. However, patient reports that he had pain last night on his L LE up to his head. Patient states that he's not very compliant with his HEP.  EVAL: Arrives to the clinic with stinging and stabbing pain randomly anywhere on the body. Sometimes the L LE can feel numb and the hips can feel weak and stiff. However, patient denies pain at the time of  evaluation. Condition started years ago without apparent reason but patient fell in 2017 where he sustained multiple  fx but was still able to work 3 years after. Denies any trauma recently. Imaging was done recently (see below). Patient went to a spine specialist lately and referred him to outpatient PT evaluation and management.  PERTINENT HISTORY:  SVT ablation  PAIN:  Are you having pain? Yes: NPRS scale: 0 at present, 8-9/10 when it hurts Pain location: bottom of the feet, fingers, low back  Pain description: intermittent, very random, stinging and stabbing pain Aggravating factors: laying down and being still Relieving factors: cannot associate any relieving factor  PRECAUTIONS: None  RED FLAGS: None   WEIGHT BEARING RESTRICTIONS: No  FALLS:  Has patient fallen in last 6 months? No  LIVING ENVIRONMENT: Lives with: lives with an adult companion Lives in: House/apartment Stairs: Yes: External: 5 steps; can reach both Has following equipment at home: Single point cane  OCCUPATION: retired  PLOF: Independent and Independent with basic ADLs  PATIENT GOALS: "to help with these pains"  NEXT MD VISIT: unknown  OBJECTIVE:  Note: Objective measures were completed at Evaluation unless otherwise noted.  DIAGNOSTIC FINDINGS:  06/26/23 LUMBAR SPINE - COMPLETE 4+ VIEW   COMPARISON:  None Available.   FINDINGS: Five non-rib-bearing lumbar vertebra. Moderate dextroscoliotic curvature at the thoracolumbar junction. Straightening of normal lordosis. No evidence of fracture or compression deformity. Moderate anterior spurring with relative preservation of disc spaces. Minor lower lumbar facet hypertrophy. No visible pars defects or focal bone abnormality. The sacroiliac joints are congruent. Small surgical clips in the pelvis   IMPRESSION: 1. Moderate dextroscoliotic curvature at the thoracolumbar junction. 2. Moderate anterior spurring with relative preservation of  disc spaces. Minor lower lumbar facet hypertrophy.    PATIENT SURVEYS:  FOTO 55.9  COGNITION: Overall cognitive status: Within functional limits for tasks assessed     SENSATION: Light touch: WFL on B LE  MUSCLE LENGTH: Hamstrings: moderate restriction on B Thomas test: moderate restriction on B Piriformis test: moderate restriction on B  POSTURE: rounded shoulders, forward head, decreased lumbar lordosis, increased thoracic kyphosis, and knee slightly bent  PALPATION: No tenderness on major muscle mass of the lumbar spine  LUMBAR ROM:   AROM eval  Flexion 75%  Extension 50%  Right lateral flexion   Left lateral flexion   Right rotation 50%  Left rotation 50%   (Blank rows = not tested)  LOWER EXTREMITY ROM:     Active  Right eval Left eval  Hip flexion [facility]  Hip extension [facility]  Hip abduction [facility]  Hip adduction    Hip internal rotation    Hip external rotation    Knee flexion [facility] WFL  Knee extension [facility]  Ankle dorsiflexion [facility] WFL  Ankle plantarflexion [facility] WFL  Ankle inversion    Ankle eversion     (Blank rows = not tested)  LOWER EXTREMITY MMT:    MMT Right eval Left eval  Hip flexion 4 4  Hip extension 3+ 3  Hip abduction 4- 4-  Hip adduction    Hip internal rotation    Hip external rotation    Knee flexion 4 4  Knee extension 4 4  Ankle dorsiflexion 4 4  Ankle plantarflexion 4 4  Ankle inversion    Ankle eversion     (Blank rows = not tested)  LUMBAR SPECIAL TESTS:  Straight leg raise test: Negative  FUNCTIONAL TESTS:  5 times sit to stand: 11.10 sec 2 minute walk test: 415 ft  GAIT: Distance walked: 415 ft Assistive device utilized:  None Level of assistance: Complete Independence Comments: done during , knees slightly flexed on midstance, decreased swing phase on B  TREATMENT DATE:  08/15/23 NuStep, seat 9, level 3, > 70 SPM, 5' Seated hamstring stretch x 30" x 2 Seated piriformis stretch x 30" x 2 Standing  hip flexor stretch x 30" x 2 Hip vectors, RTB x 10 x 2 on each Shoulder ext, GTB x 3" x 10 x 2 Pallof press x GTB x 10 x 2 Sit-to-stand with arms forward and heel raises x 10 x 2 Standing abdominal press on // bars, 3" x 10 x 2 Educated on the importance of compliance to HEP, reprinted HEP - patient gave excellent understanding  08/08/23 NuStep, seat 9, level 2, > 70 SPM, 5' Seated hamstring stretch x 30" x 3 Seated piriformis stretch x 30" x 3 Seated hip flexor stretch x 30" x 2 Bridging with hip abd, RTB x 3" x 10 x 2 Shoulder ext, RTB x 3" x 10 x 2 Mini squats with yellow med ball on chest x 3" x 10 x 2 Standing Heel raises with yellow med ball on chest x 10 x 2  08/01/23 Reviewed goals NuStep, seat 9, level 1, > 70 SPM, 5' Seated hamstring stretch x 30" x 3 Seated piriformis stretch x 30" x 3 Bridging x 3" x 10 x 2 Sidelying clamshells x RTB x 10 x 2 Seated abdominal press with physioball, neutral spine x 10 x 2 Mini squats x 3" x 10 x 2 Standing Heel/toe raises x 10 x 2  07/23/23 Evaluation and patient education done                                                                                                                               PATIENT EDUCATION:  Education details: Educated on the pathoanatomy of low back pain. Educated on the goals and course of rehab.  Person educated: Patient Education method: Explanation Education comprehension: verbalized understanding  HOME EXERCISE PROGRAM: Access Code: [email or web address] URL: [email or web address] 08/08/2023 - Seated Hip Flexor Stretch  - 1 x daily - 5 x weekly - 3 sets - 10 reps - 30 hold  Date: 08/01/2023 Prepared by: Krystal Clark  Exercises - Seated Hamstring Stretch  - 1 x daily - 5 x weekly - 3 reps - 30 hold - Seated Piriformis Stretch  - 1 x daily - 5 x weekly - 3 reps - 30 hold - Supine Bridge  - 1 x daily - 5 x weekly - 2 sets - 10 reps - 3 hold - Clam with Resistance  - 1 x daily - 5 x weekly  - 2 sets - 10 reps - Seated Abdominal Press into [employer]  - 1 x daily - 5 x weekly - 2 sets - 10 reps - 3 hold - Mini Squat with Counter Support  - 1 x daily - 5 x weekly - 2 sets - 10 reps -  3 hold - Heel Toe Raises with Counter Support  - 1 x daily - 5 x weekly - 2 sets - 10 reps  ASSESSMENT:  CLINICAL IMPRESSION: Interventions today were geared towards LE flexibility, core and LE strength. Tolerated all activities without worsening of symptoms Demonstrated mild levels of fatigue. Rest periods given. Provided slight amount of cueing to ensure correct execution of activity with good carry-over. To date, skilled PT is required to address the impairments and improve function.  EVAL: Patient is a 73 y.o. male who was seen today for physical therapy evaluation and treatment for lumbar pain. Patient's condition is further defined by difficulty with laying down due to pain, weakness, and decreased soft tissue extensibility. Skilled PT is required to address the impairments and functional limitations listed below.    OBJECTIVE IMPAIRMENTS: decreased activity tolerance, decreased ROM, decreased strength, impaired flexibility, and pain.   ACTIVITY LIMITATIONS: carrying, lifting, bending, sitting, standing, squatting, stairs, and bed mobility  PARTICIPATION LIMITATIONS: meal prep, cleaning, laundry, driving, shopping, community activity, and yard work  PERSONAL FACTORS: Time since onset of injury/illness/exacerbation are also affecting patient's functional outcome.   REHAB POTENTIAL: Fair    CLINICAL DECISION MAKING: Stable/uncomplicated  EVALUATION COMPLEXITY: Low   GOALS: Goals reviewed with patient? Yes  SHORT TERM GOALS: Target date: 08/05/22  Pt will demonstrate indep in HEP to facilitate carry-over of skilled services and improve functional outcomes Goal status: INITIAL  LONG TERM GOALS: Target date: 08/20/23  Pt will increase FOTO to at least 58 in order to demonstrate  significant improvement in function related to  ADLs Baseline: 55.9 Goal status: INITIAL  2.  Pt will decrease 5TSTS by at least 3 seconds in order to demonstrate clinically significant improvement in LE strength  Baseline: 11.10 sec Goal status: INITIAL  3.  Pt will increase by at least 40 ft in order to demonstrate clinically significant improvement in community ambulation Baseline: 415 ft Goal status: INITIAL  4.  Pt will demonstrate increase in LE strength to 4-/5 to facilitate ease and safety in ambulation Baseline: 3/5 Goal status: INITIAL  PLAN:  PT FREQUENCY: 1x/week  PT DURATION: 4 weeks  PLANNED INTERVENTIONS: 97164- PT Re-evaluation, 97110-Therapeutic exercises, 97530- Therapeutic activity, [id number]- Neuromuscular re-education, 97535- Self Care, 60454- Manual therapy, and Patient/Family education.  PLAN FOR NEXT SESSION: Continue POC and may progress as tolerated with emphasis on LE flexibility, core, and strengthening activities. Re-assess next visit.   Tish Frederickson. Jaziya Obarr, PT, DPT, OCS Board-Certified Clinical Specialist in Orthopedic PT PT Compact Privilege # ([place]): [id number] T 08/15/2023, 10:17 AM

## 2023-08-17 ENCOUNTER — Other Ambulatory Visit: Payer: Self-pay | Admitting: Family Medicine

## 2023-08-17 MED ORDER — CEPHALEXIN 500 MG PO CAPS: 500.0000 mg | ORAL_CAPSULE | Freq: Two times a day (BID) | ORAL | 0 refills | Status: AC

## 2023-08-17 NOTE — Telephone Encounter (Signed)
sent

## 2023-08-17 NOTE — Telephone Encounter (Signed)
Medication sent to pharmacy. Pt. Informed. Copied from CRM [phone]. Topic: Clinical - Medication Question >> Aug 17, 2023 10:48 AM Carlatta H wrote: Reason for CRM: Patient would like to have an antibiotic called in for the bumps around his mouth//He was prescribed something previously but he didn't know the name of the medication//Please call

## 2023-08-17 NOTE — Telephone Encounter (Signed)
Provider approved antibiotic and pt has been informed.

## 2023-08-20 ENCOUNTER — Ambulatory Visit (HOSPITAL_COMMUNITY): Payer: Medicare PPO | Admitting: Physical Therapy

## 2023-08-20 DIAGNOSIS — R262 Difficulty in walking, not elsewhere classified: Secondary | ICD-10-CM | POA: Diagnosis not present

## 2023-08-20 DIAGNOSIS — M6281 Muscle weakness (generalized): Secondary | ICD-10-CM

## 2023-08-20 DIAGNOSIS — M5459 Other low back pain: Secondary | ICD-10-CM

## 2023-08-20 DIAGNOSIS — R29898 Other symptoms and signs involving the musculoskeletal system: Secondary | ICD-10-CM

## 2023-08-20 NOTE — Therapy (Signed)
OUTPATIENT PHYSICAL THERAPY THORACOLUMBAR TREATMENT/Discharge   Patient Name: Steven [name] MRN: [id number] DOB:11-12-1950, 73 y.o., male Today's Date: 08/20/2023 PHYSICAL THERAPY DISCHARGE SUMMARY  Visits from Start of Care: 4  Current functional level related to goals / functional outcomes: Improved ROM, pain and strength   Remaining deficits: Still has some pain and strength and ROM deficits   Education / Equipment: New HEP    Patient agrees to discharge. Patient goals were met. Patient is being discharged due to being pleased with the current functional level.  END OF SESSION:  PT End of Session - 08/20/23 1457     Visit Number 5    Number of Visits 5    Date for PT Re-Evaluation 08/20/23    Authorization Time Period 08/01/23 - 08/20/23    Authorization - Visit Number 4    Authorization - Number of Visits 4    PT Start Time 1348    PT Stop Time 1430    PT Time Calculation (min) 42 min    Activity Tolerance Patient tolerated treatment well    Behavior During Therapy WFL for tasks assessed/performed              Past Medical History:  Diagnosis Date   Anxiety    Arthritis    Atrial fibrillation (HCC)    Atrial flutter (HCC)    BPH (benign prostatic hyperplasia)    Chest pain    Dizziness    Dyspnea    laying down occ   Fracture 08/17/2015   MULTIPLE RIB FRACTURES     FROM FALL    GERD (gastroesophageal reflux disease)    Hemorrhoids    History of kidney stones    noted on CT scan   History of radiation therapy 08/22/11-10/13/11   prostate   Hyperlipemia    Hypertension    Hyperthyroidism    IBS (irritable bowel syndrome)    Insomnia    Light headedness    Migraine    Numbness and tingling in left arm    Numbness and tingling of both legs    Open fracture of left elbow 08/18/2015   Prostate cancer [facility])    prostate s/p radiation Mar 2013   Rib fractures 08/17/2015   Tinnitus    Past Surgical History:  Procedure Laterality Date   BOTOX  INJECTION N/A 01/14/2020   Procedure: INJECTION OF BOTOX INTO ANAL SPHINCTER;  Surgeon: Andria Meuse, MD;  Location: [facility];  Service: General;  Laterality: N/A;   COLONOSCOPY N/A 12/19/2012   WUJ:[id number] 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 Normal appearing colon   COLONOSCOPY WITH PROPOFOL N/A 10/23/2019   Procedure: COLONOSCOPY WITH PROPOFOL;  Surgeon: Corbin Ade, MD; external and grade 2 internal hemorrhoids, abnormal rectal blood vessels consistent with radiation proctitis s/p APC therapy, otherwise normal exam.   EVALUATION UNDER ANESTHESIA WITH ANAL FISTULECTOMY N/A 01/14/2020   Procedure: ANORECTAL EXAM UNDER ANESTHESIA;  Surgeon: Andria Meuse, MD;  Location: [facility];  Service: General;  Laterality: N/A;   HOT HEMOSTASIS  10/23/2019   Procedure: HOT HEMOSTASIS (ARGON PLASMA COAGULATION/BICAP);  Surgeon: Corbin Ade, MD;  Location: [facility];  Service: Endoscopy;;  apc rectal proctitis     KIDNEY SURGERY  1982   kidney tube collapse repair   RADIOACTIVE SEED IMPLANT     Prostate   SVT ABLATION N/A 12/12/2022   Procedure: SVT ABLATION;  Surgeon: Marinus Maw, MD;  Location:  [facility];  Service: Cardiovascular;  Laterality: N/A;   Patient Active Problem List   Diagnosis Date Noted   Headache with neurologic deficit 07/12/2023   Contact dermatitis 06/27/2023   Low back pain 06/27/2023   Upper respiratory infection 06/07/2023   Inflammatory arthritis 05/23/2023   Pain of left hip 05/11/2023   Migraine, unspecified, not intractable, without status migrainosus 05/08/2023   Depression with anxiety 04/19/2023   Mixed hyperlipidemia 03/14/2023   Prediabetes 03/14/2023   Diffuse pain 02/19/2023   Hypertensive urgency 11/24/2022   Leukopenia 11/24/2022   SVT (supraventricular tachycardia) (HCC) 11/24/2022   Orthostatic hypotension 11/23/2022   Chest tightness 08/22/2022   Hypokalemia  08/21/2022   Calculus of gallbladder without cholecystitis without obstruction 08/09/2022   Secondary adrenocortical insufficiency (HCC) 07/19/2022   Rash and nonspecific skin eruption 05/10/2022   Dysphagia 05/10/2022   Fibromyalgia 07/07/2020   Paresthesia of upper limb 03/04/2020   Carcinoma of prostate (HCC) 02/17/2020   Subacute thyroiditis 01/07/2020   Personal history of malignant neoplasm of prostate 12/12/2019   Benign prostatic hyperplasia with urinary obstruction 12/12/2019   Weak urinary stream 12/12/2019   Hyperthyroidism 11/24/2019   Unintentional weight loss 10/21/2019   IBS (irritable bowel syndrome) 10/21/2019   Dizzy spells 09/20/2019   Intractable chronic migraine without aura 09/20/2019   Sleep apnea 09/20/2019   Psychophysiologic insomnia 09/20/2019   Rectal pain 08/21/2019   Nausea without vomiting 05/30/2019   Paroxysmal atrial flutter (HCC) 04/17/2019   Chronic atrial fibrillation (HCC) 03/02/2019   HTN (hypertension) 03/02/2019   Constipation 08/02/2017   Hemorrhoids 08/02/2017   Fall 08/18/2015   Radiation proctitis 01/30/2013   Rectal bleeding 12/10/2012   Obese 05/30/2012   Dyspnea 05/30/2012   Elevated lipids 05/30/2012   Palpitations 05/30/2012   Malignant neoplasm of prostate (HCC) 08/13/2011    PCP: Rica Records, FNP  REFERRING PROVIDER: Rica Records, FNP  REFERRING DIAG: M54.50 (ICD-10-CM) - Lumbar pain  Rationale for Evaluation and Treatment: Rehabilitation  THERAPY DIAG:  Other low back pain  Other symptoms and signs involving the musculoskeletal system  Difficulty in walking, not elsewhere classified  Muscle weakness (generalized)  ONSET DATE: years ago  SUBJECTIVE:                                                                                                                                                                                           SUBJECTIVE STATEMENT:  Pt states that he is doing the  exercises about once a day.  He is not sure if it is helping or not.   EVAL: Arrives to the clinic with  stinging and stabbing pain randomly anywhere on the body. Sometimes the L LE can feel numb and the hips can feel weak and stiff. However, patient denies pain at the time of evaluation. Condition started years ago without apparent reason but patient fell in 2017 where he sustained multiple fx but was still able to work 3 years after. Denies any trauma recently. Imaging was done recently (see below). Patient went to a spine specialist lately and referred him to outpatient PT evaluation and management.  PERTINENT HISTORY:  SVT ablation  PAIN:  Are you having pain? Yes: NPRS scale: 0 at present, 4-5 at eval was 8-9/10 when it hurts Pain location: bottom of the feet, fingers, low back  Pain description: intermittent, very random, stinging and stabbing pain Aggravating factors: laying down and being still Relieving factors: cannot associate any relieving factor  PRECAUTIONS: None  RED FLAGS: None   WEIGHT BEARING RESTRICTIONS: No  FALLS:  Has patient fallen in last 6 months? No  LIVING ENVIRONMENT: Lives with: lives with an adult companion Lives in: House/apartment Stairs: Yes: External: 5 steps; can reach both Has following equipment at home: Single point cane  OCCUPATION: retired  PLOF: Independent and Independent with basic ADLs  PATIENT GOALS: "to help with these pains"  NEXT MD VISIT: unknown  OBJECTIVE:  Note: Objective measures were completed at Evaluation unless otherwise noted.  DIAGNOSTIC FINDINGS:  06/26/23 LUMBAR SPINE - COMPLETE 4+ VIEW   COMPARISON:  None Available.   FINDINGS: Five non-rib-bearing lumbar vertebra. Moderate dextroscoliotic curvature at the thoracolumbar junction. Straightening of normal lordosis. No evidence of fracture or compression deformity. Moderate anterior spurring with relative preservation of disc spaces. Minor lower lumbar facet  hypertrophy. No visible pars defects or focal bone abnormality. The sacroiliac joints are congruent. Small surgical clips in the pelvis   IMPRESSION: 1. Moderate dextroscoliotic curvature at the thoracolumbar junction. 2. Moderate anterior spurring with relative preservation of disc spaces. Minor lower lumbar facet hypertrophy.    PATIENT SURVEYS:  FOTO 55.9 Current 55.9  COGNITION: Overall cognitive status: Within functional limits for tasks assessed     SENSATION: Light touch: WFL on B LE  MUSCLE LENGTH: Hamstrings: moderate restriction on B Thomas test: moderate restriction on B Piriformis test: moderate restriction on B  POSTURE: rounded shoulders, forward head, decreased lumbar lordosis, increased thoracic kyphosis, and knee slightly bent  PALPATION: No tenderness on major muscle mass of the lumbar spine  LUMBAR ROM:   AROM eval 08/20/23  Flexion 75% 80%of normal   Extension 50% 80% of normal  Right lateral flexion    Left lateral flexion    Right rotation 50% 100% normal  Left rotation 50% 80% normal    (Blank rows = not tested)  LOWER EXTREMITY ROM:     Active  Right eval Left eval  Hip flexion [facility]  Hip extension [facility]  Hip abduction [facility]  Hip adduction    Hip internal rotation    Hip external rotation    Knee flexion [facility]  Knee extension [facility]  Ankle dorsiflexion [facility]  Ankle plantarflexion [facility]  Ankle inversion    Ankle eversion     (Blank rows = not tested)  LOWER EXTREMITY MMT:    MMT Right eval Right  08/19/22 Left eval Left 08/19/22  Hip flexion 4 4+ 4 5  Hip extension 3+ 3- 3 3-  Hip abduction 4- 4+ 4- 4-  Hip adduction      Hip internal  rotation      Hip external rotation      Knee flexion 4 4 4 4   Knee extension 4 5 4 5   Ankle dorsiflexion 4 5 4 5   Ankle plantarflexion 4  4   Ankle inversion      Ankle eversion       (Blank rows = not tested)  LUMBAR SPECIAL TESTS:  Straight leg raise test:  Negative  FUNCTIONAL TESTS:  5 times sit to stand: 11.10 sec;  08/20/23 11.48 2 minute walk test: 415 ft; 08/20/23 460   GAIT: Distance walked: 415 ft Assistive device utilized: None Level of assistance: Complete Independence Comments: done during , knees slightly flexed on midstance, decreased swing phase on B  TREATMENT DATE:  08/15/23 NuStep, seat 9, level 3, > 70 SPM, 5' Seated hamstring stretch x 30" x 2 Seated piriformis stretch x 30" x 2 Standing hip flexor stretch x 30" x 2 Hip vectors, RTB x 10 x 2 on each Shoulder ext, GTB x 3" x 10 x 2 Pallof press x GTB x 10 x 2 Sit-to-stand with arms forward and heel raises x 10 x 2 Standing abdominal press on // bars, 3" x 10 x 2 Educated on the importance of compliance to HEP, reprinted HEP - patient gave excellent understanding  08/08/23 NuStep, seat 9, level 2, > 70 SPM, 5' Seated hamstring stretch x 30" x 3 Seated piriformis stretch x 30" x 3 Seated hip flexor stretch x 30" x 2 Bridging with hip abd, RTB x 3" x 10 x 2 Shoulder ext, RTB x 3" x 10 x 2 Mini squats with yellow med ball on chest x 3" x 10 x 2 Standing Heel raises with yellow med ball on chest x 10 x 2                                                                               PATIENT EDUCATION:  Education details: Educated on the pathoanatomy of low back pain. Educated on the goals and course of rehab.  Person educated: Patient Education method: Explanation Education comprehension: verbalized understanding  HOME EXERCISE PROGRAM: Access Code: [email or web address] - Side Stepping with Resistance at Thighs  - 1 x daily - 7 x weekly - 1 sets - 10 reps - Prone Heel Squeeze  - 1 x daily - 7 x weekly - 1 sets - 10 reps - 3-5" hold - Beginner Prone Single Leg Raise  - 1 x daily - 7 x weekly - 1 sets - 10 reps - 3-5" hold - Supine Bridge  - 1 x daily - 7 x weekly - 1 sets - 10 reps - 3-5 hold - Sit to Stand  - 1 x daily - 7 x weekly - 1 sets - 10 reps - 3-5  hold URL: [email or web address] Date: 08/20/2023 Prepared by: Virgina Organ  Exercises - Seated Hamstring Stretch  - 1 x daily - 5 x weekly - 3 reps - 30 hold - Seated Piriformis Stretch  - 1 x daily - 5 x weekly - 3 reps - 30 hold - Supine Bridge  - 1 x daily - 5 x weekly - 2 sets - 10 reps -  3 hold - Clam with Resistance  - 1 x daily - 5 x weekly - 2 sets - 10 reps - Seated Abdominal Press into [employer]  - 1 x daily - 5 x weekly - 2 sets - 10 reps - 3 hold - Mini Squat with Counter Support  - 1 x daily - 5 x weekly - 2 sets - 10 reps - 3 hold - Heel Toe Raises with Counter Support  - 1 x daily - 5 x weekly - 2 sets - 10 reps - Seated Hip Flexor Stretch  - 1 x daily - 5 x weekly - 3 sets - 10 reps - 30 hold  URL: [email or web address] Date: 08/20/2023 Prepared by: Virgina Organ  Exercises - Seated Hamstring Stretch  - 1 x daily - 5 x weekly - 3 reps - 30 hold - Seated Piriformis Stretch  - 1 x daily - 5 x weekly - 3 reps - 30 hold - Supine Bridge  - 1 x daily - 5 x weekly - 2 sets - 10 reps - 3 hold - Clam with Resistance  - 1 x daily - 5 x weekly - 2 sets - 10 reps - Seated Abdominal Press into [employer]  - 1 x daily - 5 x weekly - 2 sets - 10 reps - 3 hold - Mini Squat with Counter Support  - 1 x daily - 5 x weekly - 2 sets - 10 reps - 3 hold - Heel Toe Raises with Counter Support  - 1 x daily - 5 x weekly - 2 sets - 10 reps - Seated Hip Flexor Stretch  - 1 x daily - 5 x weekly - 3 sets - 10 reps - 30 hold c URL: [email or web address] 08/08/2023 - Seated Hip Flexor Stretch  - 1 x daily - 5 x weekly - 3 sets - 10 reps - 30 hold  Date: 08/01/2023 Prepared by: Krystal Clark  Exercises - Seated Hamstring Stretch  - 1 x daily - 5 x weekly - 3 reps - 30 hold - Seated Piriformis Stretch  - 1 x daily - 5 x weekly - 3 reps - 30 hold - Supine Bridge  - 1 x daily - 5 x weekly - 2 sets - 10 reps - 3 hold - Clam with Resistance  - 1 x  daily - 5 x weekly - 2 sets - 10 reps - Seated Abdominal Press into [employer]  - 1 x daily - 5 x weekly - 2 sets - 10 reps - 3 hold - Mini Squat with Counter Support  - 1 x daily - 5 x weekly - 2 sets - 10 reps - 3 hold - Heel Toe Raises with Counter Support  - 1 x daily - 5 x weekly - 2 sets - 10 reps  ASSESSMENT:  CLINICAL IMPRESSION: EVAL: Patient is a 73 y.o. male who was seen today for physical therapy evaluation and treatment for lumbar pain. Patient's condition is further defined by difficulty with laying down due to pain, weakness, and decreased soft tissue extensibility. Skilled PT is required to address the impairments and functional limitations listed below.    OBJECTIVE IMPAIRMENTS: decreased activity tolerance, decreased ROM, decreased strength, impaired flexibility, and pain.   ACTIVITY LIMITATIONS: carrying, lifting, bending, sitting, standing, squatting, stairs, and bed mobility  PARTICIPATION LIMITATIONS: meal prep, cleaning, laundry, driving, shopping, community activity, and yard work  PERSONAL FACTORS: Time since onset of injury/illness/exacerbation are also affecting patient's functional outcome.  REHAB POTENTIAL: Fair    CLINICAL DECISION MAKING: Stable/uncomplicated  EVALUATION COMPLEXITY: Low   GOALS: Goals reviewed with patient? Yes  SHORT TERM GOALS: Target date: 08/05/22  Pt will demonstrate indep in HEP to facilitate carry-over of skilled services and improve functional outcomes Goal status: met   LONG TERM GOALS: Target date: 08/20/23  Pt will increase FOTO to at least 58 in order to demonstrate significant improvement in function related to  ADLs Baseline: 55.9 Goal status: not met   2.  Pt will decrease 5TSTS by at least 3 seconds in order to demonstrate clinically significant improvement in LE strength  Baseline: 11.10 sec Goal status: not met  3.  Pt will increase by at least 40 ft in order to demonstrate clinically significant  improvement in community ambulation Baseline: 415 ft Goal status: met  4.  Pt will demonstrate increase in LE strength to 4-/5 to facilitate ease and safety in ambulation Baseline: 3/5 Goal status: partially met   PLAN:  PT FREQUENCY: 1x/week  PT DURATION: 4 weeks  PLANNED INTERVENTIONS: 97164- PT Re-evaluation, 97110-Therapeutic exercises, 97530- Therapeutic activity, [id number]- Neuromuscular re-education, 97535- Self Care, 16109- Manual therapy, and Patient/Family education.  PLAN FOR NEXT SESSION:Discharge to a HEP at this time.  Virgina Organ, PT CLT [phone]

## 2023-08-22 ENCOUNTER — Encounter (HOSPITAL_COMMUNITY): Payer: Medicare PPO

## 2023-08-31 ENCOUNTER — Ambulatory Visit: Payer: Self-pay | Admitting: Family Medicine

## 2023-08-31 ENCOUNTER — Other Ambulatory Visit: Payer: Self-pay | Admitting: Family Medicine

## 2023-08-31 NOTE — Telephone Encounter (Signed)
 Copied from CRM [id number]. Topic: Clinical - Medication Refill >> Aug 31, 2023 12:05 PM Benton KIDD wrote: Most Recent Primary Care Visit:  Provider: REIDA REDELL PARAS  Department: [facility]  Visit Type: INTEGRATED BH MYCHART VV  Date: 07/12/2023  Medication: pregabalin  100 mg twice daily  Has the patient contacted their pharmacy? No cause he is requesting higher dosage  (Agent: If no, request that the patient contact the pharmacy for the refill. If patient does not wish to contact the pharmacy document the reason why and proceed with request.) (Agent: If yes, when and what did the pharmacy advise?)  Is this the correct pharmacy for this prescription? Yes If no, delete pharmacy and type the correct one.  This is the patient's preferred pharmacy:  [id number] - [place], [place] - [address] [place] [address] Phone: [phone] Fax: [phone]   Has the prescription been filled recently? No  Is the patient out of the medication? No 1 pill left   Has the patient been seen for an appointment in the last year OR does the patient have an upcoming appointment? Yes  Can we respond through MyChart? Yes  Agent: Please be advised that Rx refills may take up to 3 business days. We ask that you follow-up with your pharmacy.

## 2023-08-31 NOTE — Telephone Encounter (Signed)
 This RN made the third and final attempt to triage patient. No answer, left a message. Routing conversation to office.

## 2023-08-31 NOTE — Telephone Encounter (Signed)
 Copied from CRM [id number]. Topic: Clinical - Prescription Issue >> Aug 31, 2023 12:02 PM Whitney O wrote: Reason for CRM: patient is calling requesting a higher dosage for pregabalin  100 mg twice a day . Patient is still having pain and would like a higher dosage . Patient says unless doctor can give him something else for pain [phone]

## 2023-09-04 ENCOUNTER — Other Ambulatory Visit: Payer: Self-pay | Admitting: Family Medicine

## 2023-09-04 MED ORDER — PREGABALIN 150 MG PO CAPS: 150.0000 mg | ORAL_CAPSULE | Freq: Two times a day (BID) | ORAL | 2 refills | Status: DC

## 2023-09-04 NOTE — Telephone Encounter (Signed)
Increased to 150 mg twice daily

## 2023-09-05 NOTE — Telephone Encounter (Signed)
Pt. Informed of new plan of treatment.

## 2023-09-10 ENCOUNTER — Ambulatory Visit: Payer: Self-pay | Admitting: Family Medicine

## 2023-09-10 NOTE — Telephone Encounter (Signed)
  Chief Complaint: hypertension 180/83 Symptoms: asymptomatic Frequency: over a week Pertinent Negatives: Patient denies Chest Pain, SOB, headache, weakness, dizziness Disposition: [] ED /[] Urgent Care (no appt availability in office) / [] Appointment(In office/virtual)/ []  [facility]/ [] Home Care/ [] Refused Recommended Disposition /[] [place] [facility]/ [x]  Follow-up with PCP Additional Notes: patient calling for high blood pressure readings. Currently BP is 180/83. Patient states his blood pressure has been anywhere from SBP of 140-180. Patient is very anxious and concerned about his BP. Patient has taken all of his BP medications today and took his PRN BP medication prior to calling. Patient is encouraging to relax. Patient endorses he has been very anxious about MRI results and his appointment with urology at the end of the week. Patient would like a phone call from PCP about his BP. Patient verbalized understanding of plan and all questions answered.    Copied from CRM [id number]. Topic: Clinical - Red Word Triage >> Sep 10, 2023  5:01 PM Eunice Blase wrote: Red Word that prompted transfer to Nurse Triage: Pt called stated blood pressure is 180/83, dizzy Reason for Disposition  Systolic BP  >= 180 OR Diastolic >= 110  Answer Assessment - Initial Assessment Questions 1. BLOOD PRESSURE: "What is the blood pressure?" "Did you take at least two measurements 5 minutes apart?"     180/83 2. ONSET: "When did you take your blood pressure?"     Just took it 3. HOW: "How did you take your blood pressure?" (e.g., automatic home BP monitor, visiting nurse)     Automatic BP machine 4. HISTORY: "Do you have a history of high blood pressure?"     Yes 5. MEDICINES: "Are you taking any medicines for blood pressure?" "Have you missed any doses recently?"     Amlodipine, Metoprolol, Losartan 6. OTHER SYMPTOMS: "Do you have any symptoms?" (e.g., blurred vision, chest pain, difficulty breathing,  headache, weakness)     no  Protocols used: Blood Pressure - High-A-AH

## 2023-09-11 NOTE — Telephone Encounter (Signed)
 Office visit or ED, patient can also reach out to his cardiologist

## 2023-09-12 NOTE — Telephone Encounter (Signed)
 Pt. Stated that his pressure has come down to 160/83 he has been informed to contact his cardiologist he understood. He will continue to take his pressure and keep a log.

## 2023-09-14 ENCOUNTER — Ambulatory Visit: Payer: Medicare PPO | Admitting: Urology

## 2023-09-14 VITALS — BP 171/64 | HR 74

## 2023-09-14 DIAGNOSIS — C61 Malignant neoplasm of prostate: Secondary | ICD-10-CM

## 2023-09-14 DIAGNOSIS — R9721 Rising PSA following treatment for malignant neoplasm of prostate: Secondary | ICD-10-CM

## 2023-09-14 DIAGNOSIS — R972 Elevated prostate specific antigen [PSA]: Secondary | ICD-10-CM

## 2023-09-14 LAB — URINALYSIS, ROUTINE W REFLEX MICROSCOPIC
Bilirubin, UA: NEGATIVE
Glucose, UA: NEGATIVE
Ketones, UA: NEGATIVE
Leukocytes,UA: NEGATIVE
Nitrite, UA: NEGATIVE
Protein,UA: NEGATIVE
RBC, UA: NEGATIVE
Specific Gravity, UA: 1.02 (ref 1.005–1.030)
Urobilinogen, Ur: 0.2 mg/dL (ref 0.2–1.0)
pH, UA: 7.5 (ref 5.0–7.5)

## 2023-09-14 NOTE — Progress Notes (Signed)
 09/14/2023 10:08 AM   Steven [name] [birth year] [id number]  Referring provider: Rica Records, FNP [phone] [address] [place],  [place] [address]  Rising PSA after treatment for prostate cancer   HPI: Steven [name] is a 73yo here for followup for prostate cancer. Prostate MRI shows a 0.41cc PIRADS 4 lesion right posterior lateral peripheral zone. He is a hx of IMRT for prostate cancer. No worsening LUTS.    PMH: Past Medical History:  Diagnosis Date   Anxiety    Arthritis    Atrial fibrillation (HCC)    Atrial flutter (HCC)    BPH (benign prostatic hyperplasia)    Chest pain    Dizziness    Dyspnea    laying down occ   Fracture 08/17/2015   MULTIPLE RIB FRACTURES     FROM FALL    GERD (gastroesophageal reflux disease)    Hemorrhoids    History of kidney stones    noted on CT scan   History of radiation therapy 08/22/11-10/13/11   prostate   Hyperlipemia    Hypertension    Hyperthyroidism    IBS (irritable bowel syndrome)    Insomnia    Light headedness    Migraine    Numbness and tingling in left arm    Numbness and tingling of both legs    Open fracture of left elbow 08/18/2015   Prostate cancer [facility])    prostate s/p radiation Mar 2013   Rib fractures 08/17/2015   Tinnitus     Surgical History: Past Surgical History:  Procedure Laterality Date   BOTOX INJECTION N/A 01/14/2020   Procedure: INJECTION OF BOTOX INTO ANAL SPHINCTER;  Surgeon: Andria Meuse, MD;  Location: [facility] ORS;  Service: General;  Laterality: N/A;   COLONOSCOPY N/A 12/19/2012   HYQ:MVHQIO 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 Normal appearing colon   COLONOSCOPY WITH PROPOFOL N/A 10/23/2019   Procedure: COLONOSCOPY WITH PROPOFOL;  Surgeon: Corbin Ade, MD; external and grade 2 internal hemorrhoids, abnormal rectal blood vessels consistent with radiation proctitis s/p APC therapy, otherwise normal exam.   EVALUATION  UNDER ANESTHESIA WITH ANAL FISTULECTOMY N/A 01/14/2020   Procedure: ANORECTAL EXAM UNDER ANESTHESIA;  Surgeon: Andria Meuse, MD;  Location: [facility];  Service: General;  Laterality: N/A;   HOT HEMOSTASIS  10/23/2019   Procedure: HOT HEMOSTASIS (ARGON PLASMA COAGULATION/BICAP);  Surgeon: Corbin Ade, MD;  Location: [facility];  Service: Endoscopy;;  apc rectal proctitis     KIDNEY SURGERY  1982   kidney tube collapse repair   RADIOACTIVE SEED IMPLANT     Prostate   SVT ABLATION N/A 12/12/2022   Procedure: SVT ABLATION;  Surgeon: Marinus Maw, MD;  Location: [facility] INV[facility]IVE CV LAB;  Service: Cardiovascular;  Laterality: N/A;    Home Medications:  Allergies as of 09/14/2023       Reactions   Latex Rash, Other (See Comments)   Possible reaction to latex gloves per patient   Meclizine Other (See Comments)   Other reaction(s): Abdominal Pain, Other        Medication List        Accurate as of September 14, 2023 10:08 AM. If you have any questions, ask your nurse or doctor.          alfuzosin 10 MG 24 hr tablet Commonly known as: UROXATRAL Take 1 tablet (10 mg total) by mouth daily with breakfast.   Norvasc  5 MG tablet Generic drug: amLODipine Take 5 mg by mouth daily.   amLODipine 10 MG tablet Commonly known as: NORVASC Take 1 tablet (10 mg total) by mouth daily.   apixaban 5 MG Tabs tablet Commonly known as: Eliquis Take 1 tablet (5 mg total) by mouth 2 (two) times daily.   Azelastine-Fluticasone 137-50 MCG/ACT Susp Place 1 spray into the nose every 12 (twelve) hours.   cloNIDine 0.1 MG tablet Commonly known as: CATAPRES Take 1 tablet (0.1 mg total) by mouth 2 (two) times daily as needed (SBP>160).   diazepam 2 MG tablet Commonly known as: Valium Take 1 tablet by mouth prior to MRI.   DULoxetine 30 MG capsule Commonly known as: Cymbalta Take 1 capsule (30 mg total) by mouth daily.   DULoxetine 60 MG capsule Commonly known as: Cymbalta Take 1  capsule (60 mg total) by mouth daily.   Emgality 120 MG/ML Soaj Generic drug: Galcanezumab-gnlm Inject 1 Pen into the skin every 30 (thirty) days.   ezetimibe 10 MG tablet Commonly known as: Zetia Take 1 tablet (10 mg total) by mouth daily.   furosemide 20 MG tablet Commonly known as: LASIX Take 1 tablet (20 mg total) by mouth daily. Take with or after meal.   hydrOXYzine 10 MG tablet Commonly known as: ATARAX Take 1 tablet (10 mg total) by mouth 3 (three) times daily as needed for anxiety.   hyoscyamine 0.375 MG 12 hr tablet Commonly known as: LEVBID Take 0.375 mg by mouth 2 (two) times daily.   loratadine 10 MG tablet Commonly known as: CLARITIN Take 10 mg by mouth daily.   loratadine 10 MG tablet Commonly known as: CLARITIN Take 1 tablet (10 mg total) by mouth daily.   losartan 100 MG tablet Commonly known as: COZAAR Take 100 mg by mouth daily.   losartan 100 MG tablet Commonly known as: COZAAR Take 50 mg by mouth daily.   methimazole 5 MG tablet Commonly known as: TAPAZOLE Take 0.5 tablets (2.5 mg total) by mouth daily.   metoprolol tartrate 50 MG tablet Commonly known as: LOPRESSOR Take 50 mg by mouth 2 (two) times daily.   metoprolol tartrate 25 MG tablet Commonly known as: LOPRESSOR Take 1 tablet (25 mg total) by mouth 2 (two) times daily. TAKE EXTRA HALF TABLET  AS NEEDED   mirtazapine 15 MG tablet Commonly known as: REMERON Take 1 tablet (15 mg total) by mouth at bedtime.   mirtazapine 7.5 MG tablet Commonly known as: REMERON TAKE 1 TABLET(7.5 MG) BY MOUTH AT BEDTIME   Nurtec 75 MG Tbdp Generic drug: Rimegepant Sulfate Take 75 mg by mouth daily as needed (pain).   omeprazole 20 MG capsule Commonly known as: PRILOSEC Take 20 mg by mouth daily.   ondansetron 4 MG tablet Commonly known as: Zofran Take 1 tablet (4 mg total) by mouth every 8 (eight) hours as needed for nausea or vomiting.   MiraLax 17 g packet Generic drug: polyethylene  glycol Take 17 g by mouth once.   polyethylene glycol 17 g packet Commonly known as: MIRALAX / GLYCOLAX Take 17 g by mouth daily.   potassium chloride 20 MEQ packet Commonly known as: KLOR-CON Take 20 mEq by mouth once.   potassium chloride SA 20 MEQ tablet Commonly known as: KLOR-CON M Take 1 tablet (20 mEq total) by mouth daily.   pregabalin 150 MG capsule Commonly known as: LYRICA Take 1 capsule (150 mg total) by mouth 2 (two) times daily.   PROBIOTIC-10 PO Take  1 capsule by mouth daily.   saccharomyces boulardii 250 MG capsule Commonly known as: FLORASTOR Take 250 mg by mouth 2 (two) times daily.   tiZANidine 4 MG tablet Commonly known as: Zanaflex Take 1 tablet (4 mg total) by mouth every 6 (six) hours as needed for muscle spasms.   triamcinolone cream 0.1 % Commonly known as: KENALOG Apply 1 Application topically 2 (two) times daily.   Tylenol 325 MG tablet Generic drug: acetaminophen Take 325 mg by mouth every 6 (six) hours as needed.   Ubrelvy 100 MG Tabs Generic drug: Ubrogepant Take 1 tablet (100 mg total) by mouth as needed (for headaches). May repeat a dose in 2 hours if needed. Max dose 2 pills in 24 hours   vitamin B-12 100 MCG tablet Commonly known as: CYANOCOBALAMIN Take 100 mcg by mouth daily.   Vitamin D3 125 MCG (5000 UT) Caps Take 1 capsule by mouth daily.        Allergies:  Allergies  Allergen Reactions   Latex Rash and Other (See Comments)    Possible reaction to latex gloves per patient   Meclizine Other (See Comments)    Other reaction(s): Abdominal Pain, Other    Family History: Family History  Problem Relation Age of Onset   Aneurysm Mother        aortic   Hypertension Mother    Headache Mother    Prostate cancer Father    Hypertension Father    Heart failure Brother    Colon cancer Neg Hx    Colon polyps Neg Hx     Social History:  reports that he quit smoking about 5 years ago. His smoking use included cigarettes.  He started smoking about 25 years ago. He has a 20 pack-year smoking history. He has been exposed to tobacco smoke. He has never used smokeless tobacco. He reports that he does not currently use alcohol. He reports that he does not use drugs.  ROS: All other review of systems were reviewed and are negative except what is noted above in HPI  Physical Exam: BP (!) 171/64   Pulse 74   Constitutional:  Alert and oriented, No acute distress. HEENT: [place] AT, moist mucus membranes.  Trachea midline, no masses. Cardiovascular: No clubbing, cyanosis, or edema. Respiratory: Normal respiratory effort, no increased work of breathing. GI: Abdomen is soft, nontender, nondistended, no abdominal masses GU: No CVA tenderness.  Lymph: No cervical or inguinal lymphadenopathy. Skin: No rashes, bruises or suspicious lesions. Neurologic: Grossly intact, no focal deficits, moving all 4 extremities. Psychiatric: Normal mood and affect.  Laboratory Data: Lab Results  Component Value Date   WBC 4.3 06/04/2023   HGB 14.2 06/04/2023   HCT 43.1 06/04/2023   MCV 88 06/04/2023   PLT 213 06/04/2023    Lab Results  Component Value Date   CREATININE 0.88 04/19/2023    Lab Results  Component Value Date   PSA 1.8 12/10/2019   PSA 2.3 11/10/2019    No results found for: "TESTOSTERONE"  Lab Results  Component Value Date   HGBA1C 5.7 (H) 01/29/2023    Urinalysis    Component Value Date/Time   COLORURINE STRAW (A) 01/07/2023 1953   APPEARANCEUR Clear 04/11/2023 1119   LABSPEC 1.008 01/07/2023 1953   PHURINE 7.0 01/07/2023 1953   GLUCOSEU Negative 04/11/2023 1119   HGBUR NEGATIVE 01/07/2023 1953   BILIRUBINUR Negative 04/11/2023 1119   KETONESUR NEGATIVE 01/07/2023 1953   PROTEINUR Negative 04/11/2023 1119   PROTEINUR NEGATIVE 01/07/2023  1953   UROBILINOGEN negative (A) 12/12/2019 1113   UROBILINOGEN 1.0 08/03/2012 1156   NITRITE Negative 04/11/2023 1119   NITRITE NEGATIVE 01/07/2023 1953    LEUKOCYTESUR Negative 04/11/2023 1119   LEUKOCYTESUR NEGATIVE 01/07/2023 1953    Lab Results  Component Value Date   LABMICR Comment 04/11/2023   WBCUA None seen 03/02/2020   LABEPIT None seen 03/02/2020   BACTERIA None seen 03/02/2020    Pertinent Imaging: MRI prostate : Images reviewed and discussed with the patient  Results for orders placed during the hospital encounter of 11/07/19  DG Abdomen 1 View  Narrative CLINICAL DATA:  Constipation. Hemorrhoids. Irritable bowel syndrome. Prostate cancer.  EXAM: ABDOMEN - 1 VIEW  COMPARISON:  CT pelvis 09/25/2019 in CT abdomen 04/29/2019  FINDINGS: Oval-shaped densities along the midline of the upper abdomen, possibly in the stomach. The patient had gallstones on the 04/29/2019 exam but these densities appear more regular and more medially located than expected location for gallstones. There is another oval-shaped density projecting over the transverse colon and 1 along the rectum, again I suspect that these are within stomach/bowel.  The tiny punctate right kidney lower pole renal calculus shown on 04/29/2019 is not readily seen on today's conventional radiographs.  Dextroconvex lumbar scoliosis with rotary component. No dilated bowel. Bowel gas pattern appears otherwise unremarkable.  IMPRESSION: 1. Oval-shaped densities in the abdomen are thought to be in the stomach and bowel. 2. No dilated bowel.  Unremarkable bowel gas pattern. 3. Dextroconvex lumbar scoliosis with rotary component.   Electronically Signed By: Gaylyn Rong M.D. On: 11/07/2019 17:35  No results found for this or any previous visit.  No results found for this or any previous visit.  No results found for this or any previous visit.  No results found for this or any previous visit.  No results found for this or any previous visit.  No results found for this or any previous visit.  No results found for this or any previous  visit.   Assessment & Plan:    1. Rising PSA after treatment for prostate cancer We will schedule for saturation biopsy. Risks/benefits/alternatives discussed - Urinalysis, Routine w reflex microscopic   No follow-ups on file.  Wilkie Aye, MD  [facility] [place]

## 2023-09-18 ENCOUNTER — Encounter: Payer: Self-pay | Admitting: Urology

## 2023-09-18 NOTE — Patient Instructions (Signed)
 Transrectal Ultrasound-Guided Prostate Biopsy A transrectal ultrasound-guided prostate biopsy is a procedure to remove samples of prostate tissue for testing. The prostate is a walnut-sized gland that is located below the bladder and in front of the rectum. During this procedure, a small device (probe) is lubricated and put inside the rectum. The probe sends out sound waves that make a picture of the prostate and surrounding tissues (transrectal ultrasound). The images are used to help guide the process of removing the samples. The samples are taken to a lab to be checked for prostate cancer. This procedure is usually done to evaluate the prostate gland of men who have raised (elevated) levels of prostate-specific antigen (PSA), which can be a sign of prostate cancer or prostate enlargement related to aging (benign prostatic hyperplasia, or BPH). Tell a health care provider about: Any allergies you have. All medicines you are taking, including vitamins, herbs, eye drops, creams, and over-the-counter medicines. Any problems you or family members have had with anesthetic medicines. Any bleeding problems you have. Any surgeries you have had. Any medical conditions you have. Any prostate infections you have had. What are the risks? Generally, this is a safe procedure. However, problems may occur, including: Prostate infection. Bleeding from the rectum. Blood in the urine. Allergic reactions to medicines. Damage to surrounding structures such as blood vessels, organs, or muscles. Difficulty passing urine. Nerve damage. This is usually temporary. What happens before the procedure? Medicines Ask your health care provider about: Changing or stopping your regular medicines. This is especially important if you are taking diabetes medicines or blood thinners. Taking medicines such as aspirin and ibuprofen. These medicines can thin your blood. Do not take these medicines unless your health care provider  tells you to take them. Taking over-the-counter medicines, vitamins, herbs, and supplements. General instructions Follow instructions from your health care provider about eating and drinking. In most instances, you will not need to stop eating and drinking completely before the procedure. You will be given an enema. During an enema, a liquid is injected into your rectum to clear out waste. You may have a blood or urine sample taken. Ask your health care provider what steps will be taken to help prevent infection. These steps may include: Washing skin with a germ-killing soap. Taking antibiotic medicine. If you will be going home right after the procedure, plan to have a responsible adult: Take you home from the hospital or clinic. You will not be allowed to drive. Care for you for the time you are told. What happens during the procedure?  An IV will be inserted into one of your veins. You will be given one or both of the following: A medicine to help you relax (sedative). A medicine to numb the area (local anesthetic). You will be placed on your left side, and your knees will be bent toward your chest. A probe with lubricated gel will be placed into your rectum, and images will be taken of your prostate and surrounding structures. Numbing medicine will be injected into your prostate. A biopsy needle will be inserted through your rectum or perineum and guided to your prostate using the ultrasound images. Prostate tissue samples will be removed, and the needle and probe will then be removed. The biopsy samples will be sent to a lab to be tested. The procedure may vary among health care providers and hospitals. What happens after the procedure? Your blood pressure, heart rate, breathing rate, and blood oxygen level will be monitored until you leave  the hospital or clinic. You may have some discomfort in the rectal area. You will be given pain medicine as needed. If you were given a sedative  during the procedure, it can affect you for several hours. Do not drive or operate machinery until your health care provider says that it is safe. It is up to you to get the results of your procedure. Ask your health care provider, or the department that is doing the procedure, when your results will be ready. Keep all follow-up visits. This is important. Summary A transrectal ultrasound-guided biopsy removes samples of tissue from your prostate using ultrasound-guided sound waves to help guide the process. This procedure is usually done to evaluate the prostate gland of men who have raised (elevated) levels of prostate-specific antigen (PSA), which can be a sign of prostate cancer or prostate enlargement related to aging. After your procedure, you may feel some discomfort in the rectal area. Plan to have a responsible adult take you home from the hospital or clinic, and follow up with your health care provider for your results. This information is not intended to replace advice given to you by your health care provider. Make sure you discuss any questions you have with your health care provider. Document Revised: 01/03/2021 Document Reviewed: 01/03/2021 Elsevier Patient Education  2024 [employer].

## 2023-09-24 ENCOUNTER — Other Ambulatory Visit: Payer: Self-pay

## 2023-09-24 DIAGNOSIS — R972 Elevated prostate specific antigen [PSA]: Secondary | ICD-10-CM

## 2023-09-30 ENCOUNTER — Other Ambulatory Visit: Payer: Self-pay

## 2023-09-30 ENCOUNTER — Encounter: Payer: Self-pay | Admitting: Emergency Medicine

## 2023-09-30 ENCOUNTER — Ambulatory Visit
Admission: EM | Admit: 2023-09-30 | Discharge: 2023-09-30 | Disposition: A | Discharge status: Home / Self Care | Attending: Family Medicine | Admitting: Family Medicine

## 2023-09-30 DIAGNOSIS — U071 COVID-19: Secondary | ICD-10-CM | POA: Diagnosis not present

## 2023-09-30 DIAGNOSIS — J3089 Other allergic rhinitis: Secondary | ICD-10-CM | POA: Diagnosis not present

## 2023-09-30 LAB — POC COVID19/FLU A&B COMBO
Covid Antigen, POC: POSITIVE — AB
Influenza A Antigen, POC: NEGATIVE
Influenza B Antigen, POC: NEGATIVE

## 2023-09-30 MED ORDER — MOLNUPIRAVIR EUA 200MG CAPSULE: 4.0000 | ORAL_CAPSULE | Freq: Two times a day (BID) | ORAL | 0 refills | Status: AC

## 2023-09-30 MED ORDER — ALBUTEROL SULFATE HFA 108 (90 BASE) MCG/ACT IN AERS: 2.0000 | INHALATION_SPRAY | RESPIRATORY_TRACT | 0 refills | Status: DC | PRN

## 2023-09-30 MED ORDER — BENZONATATE 200 MG PO CAPS: 200.0000 mg | ORAL_CAPSULE | Freq: Three times a day (TID) | ORAL | 0 refills | Status: DC | PRN

## 2023-09-30 MED ORDER — AZELASTINE HCL 0.1 % NA SOLN
1.0000 | Freq: Two times a day (BID) | NASAL | 0 refills | Status: AC
Start: 2023-09-30 — End: ?

## 2023-09-30 NOTE — Discharge Instructions (Signed)
 I suspect your ongoing congestion and cough to be seasonal allergy related.  I have sent in a nasal spray for you to use twice daily consistently and you may take over-the-counter allergy medication daily additionally.  You did test positive for COVID today, I have sent over the antiviral medication molnupiravir as you are not a good candidate for Paxlovid due to medication interactions.  I have also sent over an albuterol inhaler and Tessalon Perles to help with your symptoms further.  Drink plenty of fluids, get lots of rest and follow-up for significantly worsening symptoms.

## 2023-09-30 NOTE — ED Triage Notes (Signed)
 Pt reports nasal congestion x3 months, cough, fever, chills x3 days.

## 2023-09-30 NOTE — ED Provider Notes (Signed)
 [facility]    CSN: [id number] Arrival date & time: 09/30/23  1206      History   Chief Complaint Chief Complaint  Patient presents with   Cough    HPI Steven [name] is a 73 y.o. male.   Patient presenting today with 3-day history of congestion, cough, fever, chills, chest tightness, wheezing, fatigue.  Notes he has been having intermittent congestion and cough waxing and waning for the past few months but the past 2 days symptoms are a bit different and worse.  She denies chest pain, shortness of breath, abdominal pain, vomiting, diarrhea.  So far trying Coricidin HBP with minimal relief.    Past Medical History:  Diagnosis Date   Anxiety    Arthritis    Atrial fibrillation (HCC)    Atrial flutter (HCC)    BPH (benign prostatic hyperplasia)    Chest pain    Dizziness    Dyspnea    laying down occ   Fracture 08/17/2015   MULTIPLE RIB FRACTURES     FROM FALL    GERD (gastroesophageal reflux disease)    Hemorrhoids    History of kidney stones    noted on CT scan   History of radiation therapy 08/22/11-10/13/11   prostate   Hyperlipemia    Hypertension    Hyperthyroidism    IBS (irritable bowel syndrome)    Insomnia    Light headedness    Migraine    Numbness and tingling in left arm    Numbness and tingling of both legs    Open fracture of left elbow 08/18/2015   Prostate cancer [facility])    prostate s/p radiation Mar 2013   Rib fractures 08/17/2015   Tinnitus     Patient Active Problem List   Diagnosis Date Noted   Headache with neurologic deficit 07/12/2023   Contact dermatitis 06/27/2023   Low back pain 06/27/2023   Upper respiratory infection 06/07/2023   Inflammatory arthritis 05/23/2023   Pain of left hip 05/11/2023   Migraine, unspecified, not intractable, without status migrainosus 05/08/2023   Depression with anxiety 04/19/2023   Mixed hyperlipidemia 03/14/2023   Prediabetes 03/14/2023   Diffuse pain 02/19/2023   Hypertensive urgency  11/24/2022   Leukopenia 11/24/2022   SVT (supraventricular tachycardia) (HCC) 11/24/2022   Orthostatic hypotension 11/23/2022   Chest tightness 08/22/2022   Hypokalemia 08/21/2022   Calculus of gallbladder without cholecystitis without obstruction 08/09/2022   Secondary adrenocortical insufficiency (HCC) 07/19/2022   Rash and nonspecific skin eruption 05/10/2022   Dysphagia 05/10/2022   Fibromyalgia 07/07/2020   Paresthesia of upper limb 03/04/2020   Carcinoma of prostate (HCC) 02/17/2020   Subacute thyroiditis 01/07/2020   Personal history of malignant neoplasm of prostate 12/12/2019   Benign prostatic hyperplasia with urinary obstruction 12/12/2019   Weak urinary stream 12/12/2019   Hyperthyroidism 11/24/2019   Unintentional weight loss 10/21/2019   IBS (irritable bowel syndrome) 10/21/2019   Dizzy spells 09/20/2019   Intractable chronic migraine without aura 09/20/2019   Sleep apnea 09/20/2019   Psychophysiologic insomnia 09/20/2019   Rectal pain 08/21/2019   Nausea without vomiting 05/30/2019   Paroxysmal atrial flutter (HCC) 04/17/2019   Chronic atrial fibrillation (HCC) 03/02/2019   HTN (hypertension) 03/02/2019   Constipation 08/02/2017   Hemorrhoids 08/02/2017   Fall 08/18/2015   Radiation proctitis 01/30/2013   Rectal bleeding 12/10/2012   Obese 05/30/2012   Dyspnea 05/30/2012   Elevated lipids 05/30/2012   Palpitations 05/30/2012   Malignant neoplasm of prostate (HCC)  08/13/2011    Past Surgical History:  Procedure Laterality Date   BOTOX INJECTION N/A 01/14/2020   Procedure: INJECTION OF BOTOX INTO ANAL SPHINCTER;  Surgeon: Andria Meuse, MD;  Location: [facility];  Service: General;  Laterality: N/A;   COLONOSCOPY N/A 12/19/2012   [id number] 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 Normal appearing colon   COLONOSCOPY WITH PROPOFOL N/A 10/23/2019   Procedure: COLONOSCOPY WITH PROPOFOL;  Surgeon:  Corbin Ade, MD; external and grade 2 internal hemorrhoids, abnormal rectal blood vessels consistent with radiation proctitis s/p APC therapy, otherwise normal exam.   EVALUATION UNDER ANESTHESIA WITH ANAL FISTULECTOMY N/A 01/14/2020   Procedure: ANORECTAL EXAM UNDER ANESTHESIA;  Surgeon: Andria Meuse, MD;  Location: [facility];  Service: General;  Laterality: N/A;   HOT HEMOSTASIS  10/23/2019   Procedure: HOT HEMOSTASIS (ARGON PLASMA COAGULATION/BICAP);  Surgeon: Corbin Ade, MD;  Location: [facility];  Service: Endoscopy;;  apc rectal proctitis     KIDNEY SURGERY  1982   kidney tube collapse repair   RADIOACTIVE SEED IMPLANT     Prostate   SVT ABLATION N/A 12/12/2022   Procedure: SVT ABLATION;  Surgeon: Marinus Maw, MD;  Location: [facility];  Service: Cardiovascular;  Laterality: N/A;       Home Medications    Prior to Admission medications   Medication Sig Start Date End Date Taking? Authorizing Provider  albuterol (VENTOLIN HFA) 108 (90 Base) MCG/ACT inhaler Inhale 2 puffs into the lungs every 4 (four) hours as needed. 09/30/23  Yes Particia Nearing, PA-C  azelastine (ASTELIN) 0.1 % nasal spray Place 1 spray into both nostrils 2 (two) times daily. Use in each nostril as directed 09/30/23  Yes Particia Nearing, PA-C  benzonatate (TESSALON) 200 MG capsule Take 1 capsule (200 mg total) by mouth 3 (three) times daily as needed for cough. 09/30/23  Yes Particia Nearing, PA-C  molnupiravir EUA (LAGEVRIO) 200 mg CAPS capsule Take 4 capsules (800 mg total) by mouth 2 (two) times daily for 5 days. 09/30/23 10/05/23 Yes Particia Nearing, PA-C  acetaminophen (TYLENOL) 325 MG tablet Take 325 mg by mouth every 6 (six) hours as needed.    [provider]  alfuzosin (UROXATRAL) 10 MG 24 hr tablet Take 1 tablet (10 mg total) by mouth daily with breakfast. 04/11/23   [name], Mardene Celeste, MD  amLODipine (NORVASC) 10 MG tablet Take 1 tablet (10 mg total) by  mouth daily. 04/27/23 07/26/23  Wendall Stade, MD  amLODipine (NORVASC) 5 MG tablet Take 5 mg by mouth daily.    [provider]  apixaban (ELIQUIS) 5 MG TABS tablet Take 1 tablet (5 mg total) by mouth 2 (two) times daily. 06/06/23   Marinus Maw, MD  Azelastine-Fluticasone [phone] MCG/ACT SUSP Place 1 spray into the nose every 12 (twelve) hours. 01/24/23   Del Nigel Berthold, FNP  Cholecalciferol (VITAMIN D3) 125 MCG (5000 UT) CAPS Take 1 capsule by mouth daily.     [provider]  cloNIDine (CATAPRES) 0.1 MG tablet Take 1 tablet (0.1 mg total) by mouth 2 (two) times daily as needed (SBP>160). 11/27/22   Sherryll Burger, Pratik D, DO  diazepam (VALIUM) 2 MG tablet Take 1 tablet by mouth prior to MRI. 08/14/23   Donnita Falls, FNP  DULoxetine (CYMBALTA) 30 MG capsule Take 1 capsule (30 mg total) by mouth daily. 07/12/23   Levert Feinstein, MD  DULoxetine (CYMBALTA) 60 MG capsule Take 1 capsule (60 mg total) by mouth daily. 07/12/23   Levert Feinstein, MD  ezetimibe (ZETIA) 10 MG tablet Take 1 tablet (10 mg total) by mouth daily. 06/14/23   Roma Kayser, MD  furosemide (LASIX) 20 MG tablet Take 1 tablet (20 mg total) by mouth daily. Take with or after meal. 11/07/22   Marinus Maw, MD  Galcanezumab-gnlm [facility]) 120 MG/ML SOAJ Inject 1 Pen into the skin every 30 (thirty) days. 09/25/22   Ocie Doyne, MD  hydrOXYzine (ATARAX) 10 MG tablet Take 1 tablet (10 mg total) by mouth 3 (three) times daily as needed for anxiety. 11/26/22   Sherryll Burger, Pratik D, DO  hyoscyamine (LEVBID) 0.375 MG 12 hr tablet Take 0.375 mg by mouth 2 (two) times daily. 01/24/23   [provider]  loratadine (CLARITIN) 10 MG tablet Take 1 tablet (10 mg total) by mouth daily. 01/24/23   Del Nigel Berthold, FNP  loratadine (CLARITIN) 10 MG tablet Take 10 mg by mouth daily.    [provider]  losartan (COZAAR) 100 MG tablet Take 100 mg by mouth daily.    [provider]  losartan (COZAAR) 100 MG  tablet Take 50 mg by mouth daily.    [provider]  methimazole (TAPAZOLE) 5 MG tablet Take 0.5 tablets (2.5 mg total) by mouth daily. 07/24/23   Roma Kayser, MD  metoprolol tartrate (LOPRESSOR) 25 MG tablet Take 1 tablet (25 mg total) by mouth 2 (two) times daily. TAKE EXTRA HALF TABLET  AS NEEDED 12/22/22 04/27/23  Sharlene Dory, PA-C  metoprolol tartrate (LOPRESSOR) 50 MG tablet Take 50 mg by mouth 2 (two) times daily.    [provider]  mirtazapine (REMERON) 15 MG tablet Take 1 tablet (15 mg total) by mouth at bedtime. 06/18/23   Del Nigel Berthold, FNP  mirtazapine (REMERON) 7.5 MG tablet TAKE 1 TABLET(7.5 MG) BY MOUTH AT BEDTIME 07/02/23   Del Orbe Polanco, Iliana, FNP  NURTEC 75 MG TBDP Take 75 mg by mouth daily as needed (pain). 08/14/22   [provider]  omeprazole (PRILOSEC) 20 MG capsule Take 20 mg by mouth daily. 06/12/23   [provider]  ondansetron (ZOFRAN) 4 MG tablet Take 1 tablet (4 mg total) by mouth every 8 (eight) hours as needed for nausea or vomiting. 03/16/23   Del Newman Nip, Tenna Child, FNP  polyethylene glycol (MIRALAX / GLYCOLAX) 17 g packet Take 17 g by mouth daily. 08/23/22   Vassie Loll, MD  polyethylene glycol (MIRALAX) 17 g packet Take 17 g by mouth once.    [provider]  potassium chloride (KLOR-CON) 20 MEQ packet Take 20 mEq by mouth once.    [provider]  potassium chloride SA (KLOR-CON M) 20 MEQ tablet Take 1 tablet (20 mEq total) by mouth daily. 12/04/22   Jacalyn Lefevre, MD  pregabalin (LYRICA) 150 MG capsule Take 1 capsule (150 mg total) by mouth 2 (two) times daily. 09/04/23   Del Nigel Berthold, FNP  Probiotic Product (PROBIOTIC-10 PO) Take 1 capsule by mouth daily.    [provider]  saccharomyces boulardii (FLORASTOR) 250 MG capsule Take 250 mg by mouth 2 (two) times daily.    [provider]  tiZANidine (ZANAFLEX) 4 MG tablet Take 1 tablet (4 mg total) by  mouth every 6 (six) hours as needed for muscle spasms. 05/11/23   Anabel Halon, MD  triamcinolone cream (KENALOG) 0.1 % Apply  1 Application topically 2 (two) times daily. 06/27/23   Del Newman Nip, Tenna Child, FNP  Ubrogepant (UBRELVY) 100 MG TABS Take 1 tablet (100 mg total) by mouth as needed (for headaches). May repeat a dose in 2 hours if needed. Max dose 2 pills in 24 hours 09/25/22   Ocie Doyne, MD  vitamin B-12 (CYANOCOBALAMIN) 100 MCG tablet Take 100 mcg by mouth daily.    [provider]  gabapentin (NEURONTIN) 300 MG capsule Take 1 capsule (300 mg total) by mouth 3 (three) times daily as needed. 02/19/23   Del Nigel Berthold, FNP    Family History Family History  Problem Relation Age of Onset   Aneurysm Mother        aortic   Hypertension Mother    Headache Mother    Prostate cancer Father    Hypertension Father    Heart failure Brother    Colon cancer Neg Hx    Colon polyps Neg Hx     Social History Social History   Tobacco Use   Smoking status: Former    Current packs/day: 0.00    Average packs/day: 1 pack/day for 20.0 years (20.0 ttl pk-yrs)    Types: Cigarettes    Start date: 2000    Quit date: 2020    Years since quitting: 5.1    Passive exposure: Past   Smokeless tobacco: Never   Tobacco comments:    Quit smoking x 2 years    07/2015   SOMETIIMES i USE VAPOR     Smoking Cessation Classes, Agencies, Services & Resources Offered  Vaping Use   Vaping status: Never Used  Substance Use Topics   Alcohol use: Not Currently   Drug use: Never     Allergies   Latex and Meclizine   Review of Systems Review of Systems Per HPI  Physical Exam Triage Vital Signs ED Triage Vitals [09/30/23 1214]  Encounter Vitals Group     BP      Systolic BP Percentile      Diastolic BP Percentile      Pulse Rate 63     Resp 20     Temp 98.6 F (37 C)     Temp Source Oral     SpO2 95 %     Weight      Height      Head Circumference      Peak Flow       Pain Score 0     Pain Loc      Pain Education      Exclude from Growth Chart    No data found.  Updated Vital Signs Pulse 63   Temp 98.6 F (37 C) (Oral)   Resp 20   SpO2 95%   Visual Acuity Right Eye Distance:   Left Eye Distance:   Bilateral Distance:    Right Eye Near:   Left Eye Near:    Bilateral Near:     Physical Exam Vitals and nursing note reviewed.  Constitutional:      Appearance: He is well-developed.  HENT:     Head: Atraumatic.     Right Ear: External ear normal.     Left Ear: External ear normal.     Nose: Rhinorrhea present.     Mouth/Throat:     Pharynx: Posterior oropharyngeal erythema present. No oropharyngeal exudate.  Eyes:     Conjunctiva/sclera: Conjunctivae normal.     Pupils: Pupils are equal, round, and reactive to light.  Cardiovascular:     Rate and Rhythm: Normal rate and regular rhythm.  Pulmonary:     Effort: Pulmonary effort is normal. No respiratory distress.     Breath sounds: Wheezing present. No rales.  Musculoskeletal:        General: Normal range of motion.     Cervical back: Normal range of motion and neck supple.  Lymphadenopathy:     Cervical: No cervical adenopathy.  Skin:    General: Skin is warm and dry.  Neurological:     Mental Status: He is alert and oriented to person, place, and time.  Psychiatric:        Behavior: Behavior normal.      UC Treatments / Results  Labs (all labs ordered are listed, but only abnormal results are displayed) Labs Reviewed  POC COVID19/FLU A&B COMBO - Abnormal; Notable for the following components:      Result Value   Covid Antigen, POC Positive (*)    All other components within normal limits    EKG   Radiology No results found.  Procedures Procedures (including critical care time)  Medications Ordered in UC Medications - No data to display  Initial Impression / Assessment and Plan / UC Course  I have reviewed the triage vital signs and the nursing  notes.  Pertinent labs & imaging results that were available during my care of the patient were reviewed by me and considered in my medical decision making (see chart for details).     Vitals and exam overall reassuring, COVID-19 test positive today in clinic.  Do suspect the ongoing symptoms to be seasonal allergy related so Astelin, antihistamines for this.  He is a candidate for molnupiravir, Tessalon, supportive over-the-counter medications and home care for the COVID.  Return for worsening symptoms.  Final Clinical Impressions(s) / UC Diagnoses   Final diagnoses:  COVID-19  Seasonal allergic rhinitis due to other allergic trigger     Discharge Instructions      I suspect your ongoing congestion and cough to be seasonal allergy related.  I have sent in a nasal spray for you to use twice daily consistently and you may take over-the-counter allergy medication daily additionally.  You did test positive for COVID today, I have sent over the antiviral medication molnupiravir as you are not a good candidate for Paxlovid due to medication interactions.  I have also sent over an albuterol inhaler and Tessalon Perles to help with your symptoms further.  Drink plenty of fluids, get lots of rest and follow-up for significantly worsening symptoms.    ED Prescriptions     Medication Sig Dispense Auth. Provider   molnupiravir EUA (LAGEVRIO) 200 mg CAPS capsule Take 4 capsules (800 mg total) by mouth 2 (two) times daily for 5 days. 40 capsule Particia Nearing, [place]   benzonatate (TESSALON) 200 MG capsule Take 1 capsule (200 mg total) by mouth 3 (three) times daily as needed for cough. 20 capsule Particia Nearing, [place]   azelastine (ASTELIN) 0.1 % nasal spray Place 1 spray into both nostrils 2 (two) times daily. Use in each nostril as directed 30 mL Particia Nearing, PA-C   albuterol (VENTOLIN HFA) 108 (90 Base) MCG/ACT inhaler Inhale 2 puffs into the lungs every 4 (four) hours as  needed. 18 g Particia Nearing, [place]      PDMP not reviewed this encounter.   Particia Nearing, [place] 09/30/23 1514

## 2023-10-02 ENCOUNTER — Telehealth: Payer: Self-pay | Admitting: *Deleted

## 2023-10-02 NOTE — Telephone Encounter (Signed)
 Patient with diagnosis of afib on Eliquis for anticoagulation.    Procedure: PROSTATE BIOPSY  Date of procedure: 10/11/2023   CHA2DS2-VASc Score = 2   This indicates a 2.2% annual risk of stroke. The patient's score is based upon: CHF History: 0 HTN History: 1 Diabetes History: 0 Stroke History: 0 Vascular Disease History: 0 Age Score: 1 Gender Score: 0     CrCl 104 mL/min Platelet count 213 K    Per office protocol, patient can hold Eliquis for 3 days prior to procedure.     **This guidance is not considered finalized until pre-operative APP has relayed final recommendations.**

## 2023-10-02 NOTE — Telephone Encounter (Signed)
   Name: Steven [name]  DOB: 1950-12-26  MRN: [id number]  Primary Cardiologist: Charlton Haws, MD  Preoperative team, please contact this patient and set up a phone call appointment for further preoperative risk assessment. Please obtain consent and complete medication review. Thank you for your help.  I confirm that guidance regarding antiplatelet and oral anticoagulation therapy has been completed and, if necessary, noted below. -Per office protocol, patient can hold Eliquis for 3 days prior to procedure.   I also confirmed the patient resides in the state of [place]. As per [facility] Medical Board telemedicine laws, the patient must reside in the state in which the provider is licensed.  Tereso Newcomer, PA-C 10/02/2023, 10:24 AM [facility]

## 2023-10-02 NOTE — Telephone Encounter (Signed)
 Will route to PharmD for rec's re: holding anticoagulation. Tereso Newcomer, PA-C    10/02/2023 10:23 AM

## 2023-10-02 NOTE — Telephone Encounter (Signed)
 1st attempt to reach pt regarding surgical clearance and the need for a tele visit.  Left a message for pt to call back.

## 2023-10-02 NOTE — Telephone Encounter (Signed)
   Pre-operative Risk Assessment    Patient Name: Steven [name]  DOB: 1950-08-13 MRN: [id number]   Date of last office visit: 04/27/2023 Date of next office visit: NONE   Request for Surgical Clearance    Procedure:   PROSTATE BIOPSY  Date of Surgery:  Clearance 10/11/23                                Surgeon:  DR. Ronne Binning Surgeon's Group or Practice Name:  [facility] [place] Phone number:   Fax number:  [phone]   Type of Clearance Requested:   - Medical  - Pharmacy:  Hold Apixaban (Eliquis) X'S 3 DAYS  PRIOR AND 2 DAYS POST   Type of Anesthesia:  General    Additional requests/questions:    Signed, Elliot Cousin   10/02/2023, 10:04 AM

## 2023-10-03 ENCOUNTER — Telehealth: Payer: Self-pay

## 2023-10-03 NOTE — Telephone Encounter (Signed)
 Patient called to inform MD that he was diagnosed with covid on Sunday and wanted to know if he needed to reschedule his prostate biopsy scheduled on 10/11/2023.  Verbal from Dr. Ronne Binning, ok to keep procedure as scheduled as long as he is not symptomatic or has a active fever.  I left a detailed voicemail for patient informing him.  I requested him to call back with any questions, direct call back number provided.

## 2023-10-04 ENCOUNTER — Telehealth: Payer: Self-pay | Admitting: *Deleted

## 2023-10-04 NOTE — Telephone Encounter (Signed)
 Pt has been scheduled tele preop 10/08/23 add on ok per Edd Fabian, FNP due to med hold and date of procedure.

## 2023-10-04 NOTE — Telephone Encounter (Signed)
 2nd attempt to reach the pt to schedule tele preop appt.

## 2023-10-04 NOTE — Telephone Encounter (Signed)
 Pt returning call

## 2023-10-04 NOTE — Telephone Encounter (Signed)
 Pt has been scheduled tele preop 10/08/23 add on ok per Edd Fabian, FNP due to med hold and date of procedure.      Patient Consent for Virtual Visit        Steven [name] has provided verbal consent on 10/04/2023 for a virtual visit (video or telephone).   CONSENT FOR VIRTUAL VISIT FOR:  Steven [name]  By participating in this virtual visit I agree to the following:  I hereby voluntarily request, consent and authorize [facility] and its employed or contracted physicians, physician assistants, nurse practitioners or other licensed health care professionals (the Practitioner), to provide me with telemedicine health care services (the "Services") as deemed necessary by the treating Practitioner. I acknowledge and consent to receive the Services by the Practitioner via telemedicine. I understand that the telemedicine visit will involve communicating with the Practitioner through live audiovisual communication technology and the disclosure of certain medical information by electronic transmission. I acknowledge that I have been given the opportunity to request an in-person assessment or other available alternative prior to the telemedicine visit and am voluntarily participating in the telemedicine visit.  I understand that I have the right to withhold or withdraw my consent to the use of telemedicine in the course of my care at any time, without affecting my right to future care or treatment, and that the Practitioner or I may terminate the telemedicine visit at any time. I understand that I have the right to inspect all information obtained and/or recorded in the course of the telemedicine visit and may receive copies of available information for a reasonable fee.  I understand that some of the potential risks of receiving the Services via telemedicine include:  Delay or interruption in medical evaluation due to technological equipment failure or disruption; Information transmitted may  not be sufficient (e.g. poor resolution of images) to allow for appropriate medical decision making by the Practitioner; and/or  In rare instances, security protocols could fail, causing a breach of personal health information.  Furthermore, I acknowledge that it is my responsibility to provide information about my medical history, conditions and care that is complete and accurate to the best of my ability. I acknowledge that Practitioner's advice, recommendations, and/or decision may be based on factors not within their control, such as incomplete or inaccurate data provided by me or distortions of diagnostic images or specimens that may result from electronic transmissions. I understand that the practice of medicine is not an exact science and that Practitioner makes no warranties or guarantees regarding treatment outcomes. I acknowledge that a copy of this consent can be made available to me via my patient portal [facility] MyChart), or I can request a printed copy by calling the office of  [facility].    I understand that my insurance will be billed for this visit.   I have read or had this consent read to me. I understand the contents of this consent, which adequately explains the benefits and risks of the Services being provided via telemedicine.  I have been provided ample opportunity to ask questions regarding this consent and the Services and have had my questions answered to my satisfaction. I give my informed consent for the services to be provided through the use of telemedicine in my medical care

## 2023-10-07 NOTE — Progress Notes (Unsigned)
 Virtual Visit via Telephone Note   Because of Steven [name] co-morbid illnesses, he is at least at moderate risk for complications without adequate follow up.  This format is felt to be most appropriate for this patient at this time.  Due to technical limitations with video connection (technology), today's appointment will be conducted as an audio only telehealth visit, and [name] verbally agreed to proceed in this manner.   All issues noted in this document were discussed and addressed.  No physical exam could be performed with this format.  Evaluation Performed:  Preoperative cardiovascular risk assessment _____________   Date:  10/07/2023   Patient ID:  Steven [name], DOB 06/11/1951, MRN [id number] Patient Location:  Home Provider location:   Office  Primary Care Provider:  Rica Records, FNP Primary Cardiologist:  Charlton Haws, MD  Chief Complaint / Patient Profile   73 y.o. y/o male with a h/o atrial fibrillation, hypertension, SVT who is pending prostate biopsy and presents today for telephonic preoperative cardiovascular risk assessment.  History of Present Illness    Steven [name] is a 73 y.o. male who presents via audio/video conferencing for a telehealth visit today.  Pt was last seen in cardiology clinic on 05/03/2023 by Dr. Ladona Ridgel.  At that time Steven [name] was doing well .  The patient is now pending procedure as outlined above. Since his last visit, he continues to be stable from a cardiac standpoint.  Today he denies chest pain, shortness of breath, lower extremity edema, fatigue, palpitations, melena, hematuria, hemoptysis, diaphoresis, weakness, presyncope, syncope, orthopnea, and PND.    Past Medical History    Past Medical History:  Diagnosis Date   Anxiety    Arthritis    Atrial fibrillation (HCC)    Atrial flutter (HCC)    BPH (benign prostatic hyperplasia)    Chest pain    Dizziness    Dyspnea    laying down occ   Fracture  08/17/2015   MULTIPLE RIB FRACTURES     FROM FALL    GERD (gastroesophageal reflux disease)    Hemorrhoids    History of kidney stones    noted on CT scan   History of radiation therapy 08/22/11-10/13/11   prostate   Hyperlipemia    Hypertension    Hyperthyroidism    IBS (irritable bowel syndrome)    Insomnia    Light headedness    Migraine    Numbness and tingling in left arm    Numbness and tingling of both legs    Open fracture of left elbow 08/18/2015   Prostate cancer [facility])    prostate s/p radiation Mar 2013   Rib fractures 08/17/2015   Tinnitus    Past Surgical History:  Procedure Laterality Date   BOTOX INJECTION N/A 01/14/2020   Procedure: INJECTION OF BOTOX INTO ANAL SPHINCTER;  Surgeon: Andria Meuse, MD;  Location: [facility];  Service: General;  Laterality: N/A;   COLONOSCOPY N/A 12/19/2012   ZDG:LOVFIE 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 Normal appearing colon   COLONOSCOPY WITH PROPOFOL N/A 10/23/2019   Procedure: COLONOSCOPY WITH PROPOFOL;  Surgeon: Corbin Ade, MD; external and grade 2 internal hemorrhoids, abnormal rectal blood vessels consistent with radiation proctitis s/p APC therapy, otherwise normal exam.   EVALUATION UNDER ANESTHESIA WITH ANAL FISTULECTOMY N/A 01/14/2020   Procedure: ANORECTAL EXAM UNDER ANESTHESIA;  Surgeon: Andria Meuse, MD;  Location: [facility];  Service: General;  Laterality: N/A;   HOT HEMOSTASIS  10/23/2019   Procedure: HOT HEMOSTASIS (ARGON PLASMA COAGULATION/BICAP);  Surgeon: Corbin Ade, MD;  Location: [facility];  Service: Endoscopy;;  apc rectal proctitis     KIDNEY SURGERY  1982   kidney tube collapse repair   RADIOACTIVE SEED IMPLANT     Prostate   SVT ABLATION N/A 12/12/2022   Procedure: SVT ABLATION;  Surgeon: Marinus Maw, MD;  Location: [facility];  Service: Cardiovascular;  Laterality: N/A;    Allergies  Allergies  Allergen  Reactions   Latex Rash and Other (See Comments)    Possible reaction to latex gloves per patient   Meclizine Other (See Comments)    Other reaction(s): Abdominal Pain, Other    Home Medications    Prior to Admission medications   Medication Sig Start Date End Date Taking? Authorizing Provider  acetaminophen (TYLENOL) 325 MG tablet Take 325 mg by mouth every 6 (six) hours as needed for moderate pain (pain score 4-6).    [provider]  albuterol (VENTOLIN HFA) 108 (90 Base) MCG/ACT inhaler Inhale 2 puffs into the lungs every 4 (four) hours as needed. 09/30/23   Particia Nearing, PA-C  alfuzosin (UROXATRAL) 10 MG 24 hr tablet Take 1 tablet (10 mg total) by mouth daily with breakfast. 04/11/23   [name], Mardene Celeste, MD  amLODipine (NORVASC) 10 MG tablet Take 1 tablet (10 mg total) by mouth daily. 04/27/23 10/05/23  Wendall Stade, MD  apixaban (ELIQUIS) 5 MG TABS tablet Take 1 tablet (5 mg total) by mouth 2 (two) times daily. 06/06/23   Marinus Maw, MD  azelastine (ASTELIN) 0.1 % nasal spray Place 1 spray into both nostrils 2 (two) times daily. Use in each nostril as directed 09/30/23   Particia Nearing, PA-C  Azelastine-Fluticasone 137-50 MCG/ACT SUSP Place 1 spray into the nose every 12 (twelve) hours. Patient not taking: Reported on 10/05/2023 01/24/23   Del Nigel Berthold, FNP  benzonatate (TESSALON) 200 MG capsule Take 1 capsule (200 mg total) by mouth 3 (three) times daily as needed for cough. Patient not taking: Reported on 10/04/2023 09/30/23   Particia Nearing, PA-C  Cholecalciferol (VITAMIN D3) 125 MCG (5000 UT) CAPS Take 5,000 Units by mouth daily.    [provider]  cloNIDine (CATAPRES) 0.1 MG tablet Take 1 tablet (0.1 mg total) by mouth 2 (two) times daily as needed (SBP>160). 11/27/22   Sherryll Burger, Pratik D, DO  diazepam (VALIUM) 2 MG tablet Take 1 tablet by mouth prior to MRI. Patient not taking: Reported on 10/04/2023 08/14/23   Donnita Falls, FNP   DULoxetine (CYMBALTA) 30 MG capsule Take 1 capsule (30 mg total) by mouth daily. Patient not taking: Reported on 10/04/2023 07/12/23   Levert Feinstein, MD  DULoxetine (CYMBALTA) 60 MG capsule Take 1 capsule (60 mg total) by mouth daily. 07/12/23   Levert Feinstein, MD  ezetimibe (ZETIA) 10 MG tablet Take 1 tablet (10 mg total) by mouth daily. 06/14/23   Roma Kayser, MD  furosemide (LASIX) 20 MG tablet Take 1 tablet (20 mg total) by mouth daily. Take with or after meal. 11/07/22   Marinus Maw, MD  Galcanezumab-gnlm [facility]) 120 MG/ML SOAJ Inject 1 Pen into the skin every 30 (thirty) days. Patient not taking: Reported on 10/04/2023 09/25/22   Ocie Doyne, MD  hydrOXYzine (ATARAX) 10 MG tablet Take 1 tablet (10 mg total) by mouth 3 (three) times daily  as needed for anxiety. Patient not taking: Reported on 10/04/2023 11/26/22   Maurilio Lovely D, DO  hyoscyamine (LEVBID) 0.375 MG 12 hr tablet Take 0.375 mg by mouth every 12 (twelve) hours as needed for cramping. 01/24/23   [provider]  loratadine (CLARITIN) 10 MG tablet Take 1 tablet (10 mg total) by mouth daily. 01/24/23   Del Nigel Berthold, FNP  losartan (COZAAR) 100 MG tablet Take 100 mg by mouth daily.    [provider]  methimazole (TAPAZOLE) 5 MG tablet Take 0.5 tablets (2.5 mg total) by mouth daily. 07/24/23   Roma Kayser, MD  metoprolol tartrate (LOPRESSOR) 25 MG tablet Take 1 tablet (25 mg total) by mouth 2 (two) times daily. TAKE EXTRA HALF TABLET  AS NEEDED 12/22/22 10/05/23  Sharlene Dory, PA-C  mirtazapine (REMERON) 15 MG tablet Take 1 tablet (15 mg total) by mouth at bedtime. 06/18/23   Del Nigel Berthold, FNP  mirtazapine (REMERON) 7.5 MG tablet TAKE 1 TABLET(7.5 MG) BY MOUTH AT BEDTIME Patient not taking: Reported on 10/05/2023 07/02/23   Del Orbe Polanco, Tenna Child, FNP  NURTEC 75 MG TBDP Take 75 mg by mouth daily as needed (migraine). 08/14/22   [provider]  omeprazole (PRILOSEC) 20 MG  capsule Take 20 mg by mouth daily. 06/12/23   [provider]  ondansetron (ZOFRAN) 4 MG tablet Take 1 tablet (4 mg total) by mouth every 8 (eight) hours as needed for nausea or vomiting. 03/16/23   Del Newman Nip, Tenna Child, FNP  polyethylene glycol (MIRALAX / GLYCOLAX) 17 g packet Take 17 g by mouth daily. 08/23/22   Vassie Loll, MD  potassium chloride SA (KLOR-CON M) 20 MEQ tablet Take 1 tablet (20 mEq total) by mouth daily. 12/04/22   Jacalyn Lefevre, MD  pregabalin (LYRICA) 150 MG capsule Take 1 capsule (150 mg total) by mouth 2 (two) times daily. 09/04/23   Del Nigel Berthold, FNP  tiZANidine (ZANAFLEX) 4 MG tablet Take 1 tablet (4 mg total) by mouth every 6 (six) hours as needed for muscle spasms. Patient not taking: Reported on 10/05/2023 05/11/23   Anabel Halon, MD  triamcinolone cream (KENALOG) 0.1 % Apply 1 Application topically 2 (two) times daily. Patient taking differently: Apply 1 Application topically 2 (two) times daily as needed (rash). 06/27/23   Del Newman Nip, Tenna Child, FNP  Ubrogepant (UBRELVY) 100 MG TABS Take 1 tablet (100 mg total) by mouth as needed (for headaches). May repeat a dose in 2 hours if needed. Max dose 2 pills in 24 hours 09/25/22   Ocie Doyne, MD  vitamin B-12 (CYANOCOBALAMIN) 100 MCG tablet Take 100 mcg by mouth daily.    [provider]  gabapentin (NEURONTIN) 300 MG capsule Take 1 capsule (300 mg total) by mouth 3 (three) times daily as needed. 02/19/23   Del Nigel Berthold, FNP    Physical Exam    Vital Signs:  TOBENNA NEEDS does not have vital signs available for review today.  Given telephonic nature of communication, physical exam is limited. AAOx3. NAD. Normal affect.  Speech and respirations are unlabored.  Accessory Clinical Findings    None  Assessment & Plan    1.  Preoperative Cardiovascular Risk Assessment:Procedure:   PROSTATE BIOPSY   Date of Surgery:  [place] 10/11/23  Surgeon:  DR. Ronne Binning Surgeon's Group or Practice Name:  [facility] [place] Phone number:   Fax number:  [phone]      Primary Cardiologist: Charlton Haws, MD  Chart reviewed as part of pre-operative protocol coverage. Given past medical history and time since last visit, based on ACC/AHA guidelines, JAQUESE IRVING would be at acceptable risk for the planned procedure without further cardiovascular testing.   His RCRI is very low risk, 0.4% risk of major cardiac event.  He is able to complete greater than 4 METS of physical activity.  Patient was advised that if he develops new symptoms prior to surgery to contact our office to arrange a follow-up appointment.  He verbalized understanding.  Per office protocol, patient can hold Eliquis for 3 days prior to procedure.   I will route this recommendation to the requesting party via Epic fax function and remove from pre-op pool.       Time:   Today, I have spent 11 minutes with the patient with telehealth technology discussing medical history, symptoms, and management plan.  I spent 10 minutes reviewing his past medical history, cardiac medications, and cardiac tests.   Ronney Asters, NP  10/07/2023, 7:49 PM

## 2023-10-08 ENCOUNTER — Ambulatory Visit: Attending: Cardiology

## 2023-10-08 DIAGNOSIS — Z01810 Encounter for preprocedural cardiovascular examination: Secondary | ICD-10-CM

## 2023-10-08 NOTE — Patient Instructions (Addendum)
 Steven [name]  10/08/2023     @PREFPERIOPPHARMACY @   Your procedure is scheduled on  10/11/2023.   Report to [facility] at  1200 A.M.   Call this number if you have problems the morning of surgery:  [phone]  If you experience any cold or flu symptoms such as cough, fever, chills, shortness of breath, etc. between now and your scheduled surgery, please notify us at the above number.   Remember:        Your last dose of eliquis should have been on 10/07/2023.    Do not eat after midnight.   You may drink clear liquids until 1000 am on 10/11/2023.     Clear liquids allowed are:                    Water, Juice (No red color; non-citric and without pulp; diabetics please choose diet or no sugar options), Carbonated beverages (diabetics please choose diet or no sugar options), Clear Tea (No creamer, milk, or cream, including half & half and powdered creamer), Black Coffee Only (No creamer, milk or cream, including half & half and powdered creamer), and Clear Sports drink (No red color; diabetics please choose diet or no sugar options)    Take these medicines the morning of surgery with A SIP OF WATER        alfuzosin, amlodipine, clonidie, duloxetine, methmazole, metoprolol, nurtec, omeprazole, pregabalin, ubrelvy(if needed), zofran (if needed).    Do not wear jewelry, make-up or nail polish, including gel polish,  artificial nails, or any other type of covering on natural nails (fingers and  toes).  Do not wear lotions, powders, or perfumes, or deodorant.  Do not shave 48 hours prior to surgery.  Men may shave face and neck.  Do not bring valuables to the hospital.  [facility] is not responsible for any belongings or valuables.  Contacts, dentures or bridgework may not be worn into surgery.  Leave your suitcase in the car.  After surgery it may be brought to your room.  For patients admitted to the hospital, discharge time will be determined by your treatment  team.  Patients discharged the day of surgery will not be allowed to drive home and must have someone with them for 24 hours.    Special instructions:   DO NOT smoke tobacco or vape for 24 hours before your procedure.  Please read over the following fact sheets that you were given. Anesthesia Post-op Instructions and Care and Recovery After Surgery         Transrectal Ultrasound-Guided Prostate Biopsy, Care After What can I expect after the procedure? After the procedure, it is common to have: Pain and discomfort near your butt (rectum), especially while sitting. Pink-colored pee (urine). This is due to small amounts of blood in your pee. A burning feeling while peeing. Blood in your poop (stool). Bleeding from your butt. Blood in your semen. Follow these instructions at home: Medicines Take over-the-counter and prescription medicines only as told by your doctor. If you were given a sedative during your procedure, do not drive or use machines until your doctor says that it is safe. A sedative is a medicine that helps you relax. If you were prescribed an antibiotic medicine, take it as told by your doctor. Do not stop taking it even if you start to feel better. Activity  Return to your normal activities when your doctor says that it is safe. Ask  your doctor when it is okay for you to have sex. You may have to avoid lifting. Ask your doctor how much you can safely lift. General instructions  Drink enough water to keep your pee pale yellow. Watch your pee, poop, and semen for new bleeding or bleeding that gets worse. Keep all follow-up visits. Contact a doctor if: You have any of these: Blood clots in your pee or poop. Blood in your pee more than 2 weeks after the procedure. Blood in your semen more than 2 months after the procedure. New or worse bleeding in your pee, poop, or semen. Very bad belly pain. Your pee smells bad or unusual. You have trouble peeing. Your lower  belly feels firm. You have problems getting an erection. You feel like you may vomit (are nauseous), or you vomit. Get help right away if: You have a fever or chills. You have bright red pee. You have very bad pain that does not get better with medicine. You cannot pee. Summary After this procedure, it is common to have pain and discomfort near your butt, especially while sitting. You may have blood in your pee and poop. It is common to have blood in your semen. Get help right away if you have a fever or chills. This information is not intended to replace advice given to you by your health care provider. Make sure you discuss any questions you have with your health care provider. Document Revised: 01/03/2021 Document Reviewed: 01/03/2021 Elsevier Patient Education  2024 [employer].General Anesthesia, Adult, Care After The following information offers guidance on how to care for yourself after your procedure. Your health care provider may also give you more specific instructions. If you have problems or questions, contact your health care provider. What can I expect after the procedure? After the procedure, it is common for people to: Have pain or discomfort at the IV site. Have nausea or vomiting. Have a sore throat or hoarseness. Have trouble concentrating. Feel cold or chills. Feel weak, sleepy, or tired (fatigue). Have soreness and body aches. These can affect parts of the body that were not involved in surgery. Follow these instructions at home: For the time period you were told by your health care provider:  Rest. Do not participate in activities where you could fall or become injured. Do not drive or use machinery. Do not drink alcohol. Do not take sleeping pills or medicines that cause drowsiness. Do not make important decisions or sign legal documents. Do not take care of children on your own. General instructions Drink enough fluid to keep your urine pale yellow. If  you have sleep apnea, surgery and certain medicines can increase your risk for breathing problems. Follow instructions from your health care provider about wearing your sleep device: Anytime you are sleeping, including during daytime naps. While taking prescription pain medicines, sleeping medicines, or medicines that make you drowsy. Return to your normal activities as told by your health care provider. Ask your health care provider what activities are safe for you. Take over-the-counter and prescription medicines only as told by your health care provider. Do not use any products that contain nicotine or tobacco. These products include cigarettes, chewing tobacco, and vaping devices, such as e-cigarettes. These can delay incision healing after surgery. If you need help quitting, ask your health care provider. Contact a health care provider if: You have nausea or vomiting that does not get better with medicine. You vomit every time you eat or drink. You have pain  that does not get better with medicine. You cannot urinate or have bloody urine. You develop a skin rash. You have a fever. Get help right away if: You have trouble breathing. You have chest pain. You vomit blood. These symptoms may be an emergency. Get help right away. Call 911. Do not wait to see if the symptoms will go away. Do not drive yourself to the hospital. Summary After the procedure, it is common to have a sore throat, hoarseness, nausea, vomiting, or to feel weak, sleepy, or fatigue. For the time period you were told by your health care provider, do not drive or use machinery. Get help right away if you have difficulty breathing, have chest pain, or vomit blood. These symptoms may be an emergency. This information is not intended to replace advice given to you by your health care provider. Make sure you discuss any questions you have with your health care provider. Document Revised: 10/07/2021 Document Reviewed:  10/07/2021 Elsevier Patient Education  2024 Elsevier Inc.How to Use Chlorhexidine at Home in the Shower Chlorhexidine gluconate (CHG) is a germ-killing (antiseptic) wash that's used to clean the skin. It can get rid of the germs that normally live on the skin and can keep them away for about 24 hours. If you're having surgery, you may be told to shower with CHG at home the night before surgery. This can help lower your risk for infection. To use CHG wash in the shower, follow the steps below. Supplies needed: CHG body wash. Clean washcloth. Clean towel. How to use CHG in the shower Follow these steps unless you're told to use CHG in a different way: Start the shower. Use your normal soap and shampoo to wash your face and hair. Turn off the shower or move out of the shower stream. Pour CHG onto a clean washcloth. Do not use any type of brush or rough sponge. Start at your neck, washing your body down to your toes. Make sure you: Wash the part of your body where the surgery will be done for at least 1 minute. Do not scrub. Do not use CHG on your head or face unless your health care provider tells you to. If it gets into your ears or eyes, rinse them well with water. Do not wash your genitals with CHG. Wash your back and under your arms. Make sure to wash skin folds. Let the CHG sit on your skin for 1-2 minutes or as long as told. Rinse your entire body in the shower, including all body creases and folds. Turn off the shower. Dry off with a clean towel. Do not put anything on your skin afterward, such as powder, lotion, or perfume. Put on clean clothes or pajamas. If it's the night before surgery, sleep in clean sheets. General tips Use CHG only as told, and follow the instructions on the label. Use the full amount of CHG as told. This is often one bottle. Do not smoke and stay away from flames after using CHG. Your skin may feel sticky after using CHG. This is normal. The sticky feeling  will go away as the CHG dries. Do not use CHG: If you have a chlorhexidine allergy or have reacted to chlorhexidine in the past. On open wounds or areas of skin that have broken skin, cuts, or scrapes. On babies younger than 66 months of age. Contact a health care provider if: You have questions about using CHG. Your skin gets irritated or itchy. You have a rash after  using CHG. You swallow any CHG. Call your local poison control center [phone] in the [place] Your eyes itch badly, or they become very red or swollen. Your hearing changes. You have trouble seeing. If you can't reach your provider, go to an urgent care or emergency room. Do not drive yourself. Get help right away if: You have swelling or tingling in your mouth or throat. You make high-pitched whistling sounds when you breathe, most often when you breathe out (wheeze). You have trouble breathing. These symptoms may be an emergency. Call 911 right away. Do not wait to see if the symptoms will go away. Do not drive yourself to the hospital. This information is not intended to replace advice given to you by your health care provider. Make sure you discuss any questions you have with your health care provider. Document Revised: 01/23/2023 Document Reviewed: 01/19/2022 Elsevier Patient Education  2024 [employer].

## 2023-10-09 ENCOUNTER — Encounter (HOSPITAL_COMMUNITY)
Admission: RE | Admit: 2023-10-09 | Discharge: 2023-10-09 | Disposition: A | Discharge status: Home / Self Care | Source: Ambulatory Visit | Attending: Urology | Admitting: Urology

## 2023-10-09 ENCOUNTER — Other Ambulatory Visit: Payer: Self-pay

## 2023-10-09 ENCOUNTER — Encounter (HOSPITAL_COMMUNITY): Payer: Self-pay

## 2023-10-09 VITALS — BP 131/73 | HR 56 | Temp 98.6°F | Resp 20 | Ht 67.0 in | Wt 230.0 lb

## 2023-10-09 DIAGNOSIS — R7303 Prediabetes: Secondary | ICD-10-CM | POA: Insufficient documentation

## 2023-10-09 DIAGNOSIS — Z01812 Encounter for preprocedural laboratory examination: Secondary | ICD-10-CM | POA: Insufficient documentation

## 2023-10-09 DIAGNOSIS — E0590 Thyrotoxicosis, unspecified without thyrotoxic crisis or storm: Secondary | ICD-10-CM | POA: Diagnosis not present

## 2023-10-09 HISTORY — DX: Prediabetes: R73.03

## 2023-10-09 LAB — BASIC METABOLIC PANEL
Anion gap: 10 (ref 5–15)
BUN: 9 mg/dL (ref 8–23)
CO2: 28 mmol/L (ref 22–32)
Calcium: 9 mg/dL (ref 8.9–10.3)
Chloride: 103 mmol/L (ref 98–111)
Creatinine, Ser: 0.9 mg/dL (ref 0.61–1.24)
GFR, Estimated: >60 mL/min (ref >60)
Glucose, Bld: 100 mg/dL — ABNORMAL HIGH (ref 70–99)
Potassium: 3.3 mmol/L — ABNORMAL LOW (ref 3.5–5.1)
Sodium: 141 mmol/L (ref 135–145)

## 2023-10-09 LAB — HEMOGLOBIN A1C
Hgb A1c MFr Bld: 5.6 % (ref 4.8–5.6)
Mean Plasma Glucose: 114.02 mg/dL

## 2023-10-10 ENCOUNTER — Ambulatory Visit: Payer: Self-pay | Admitting: Family Medicine

## 2023-10-10 LAB — T4, FREE: Free T4: 1 ng/dL (ref 0.82–1.77)

## 2023-10-10 LAB — LIPID PANEL
Chol/HDL Ratio: 3.7 ratio (ref 0.0–5.0)
Cholesterol, Total: 165 mg/dL (ref 100–199)
HDL: 45 mg/dL (ref >39)
LDL Chol Calc (NIH): 95 mg/dL (ref 0–99)
Triglycerides: 139 mg/dL (ref 0–149)
VLDL Cholesterol Cal: 25 mg/dL (ref 5–40)

## 2023-10-10 LAB — T3, FREE: T3, Free: 3.2 pg/mL (ref 2.0–4.4)

## 2023-10-10 LAB — TSH: TSH: 3.87 u[IU]/mL (ref 0.450–4.500)

## 2023-10-10 NOTE — Telephone Encounter (Signed)
 Chief Complaint: Nausea Frequency: Started last night Pertinent Negatives: Patient denies vomiting, fever Disposition: [] ED /[] Urgent Care (no appt availability in office) / [x] Appointment(In office/virtual)/ []  [place] [facility]/ [] Home Care/ [] Refused Recommended Disposition /[] [facility]/ []  Follow-up with PCP Additional Notes: Pt scheduled for virtual appt tmrw morning. This RN educated pt on home care, new-worsening symptoms, when to call back/seek emergent care. Pt verbalized understanding and agrees to plan.   Reason for Disposition  Nausea lasts > 1 week  Answer Assessment - Initial Assessment Questions ONSET: "When did the nausea begin?"     Last night  VOMITING: "Any vomiting?" If Yes, ask: "How many times today?"     Denies  Protocols used: Nausea-A-AH

## 2023-10-10 NOTE — Telephone Encounter (Signed)
 This RN made first attempt to contact patient with no answer. A voicemail was left with call back number provided.   Copied from CRM [id number]. Topic: Appointments - Appointment Scheduling >> Oct 10, 2023  4:32 PM Emylou G wrote: Was diagnosed w/covide on the 9th and still experiencing nausea .Marland Kitchen Pls call patient number on file

## 2023-10-10 NOTE — Telephone Encounter (Signed)
 This RN made second attempt to contact patient with no answer. A voicemail was left with call back number provided.

## 2023-10-11 ENCOUNTER — Encounter: Admitting: Family Medicine

## 2023-10-11 ENCOUNTER — Ambulatory Visit (HOSPITAL_COMMUNITY): Admission: RE | Admit: 2023-10-11 | Payer: Medicare PPO | Source: Ambulatory Visit | Admitting: Urology

## 2023-10-11 ENCOUNTER — Encounter (HOSPITAL_COMMUNITY): Admission: RE | Payer: Self-pay | Source: Ambulatory Visit

## 2023-10-11 ENCOUNTER — Telehealth: Payer: Self-pay

## 2023-10-11 ENCOUNTER — Ambulatory Visit (HOSPITAL_COMMUNITY): Admission: RE | Admit: 2023-10-11 | Source: Ambulatory Visit

## 2023-10-11 DIAGNOSIS — R7303 Prediabetes: Secondary | ICD-10-CM

## 2023-10-11 SURGERY — BIOPSY, PROSTATE
Anesthesia: General

## 2023-10-11 NOTE — Telephone Encounter (Signed)
 Patient called to reschedule his biopsy that was canceled for 10/11/2023.  The next opening is not until 11/15/2023.  He is concerned because he is having nerve pain throughout his body.  He describes the pain as sharp stabbing pain and at times its a dull lingering pain.  He is not sure if this would be related to his diagnosis.  Will he be ok to wait until 11/15/2023 to have biopsy done?

## 2023-10-11 NOTE — Progress Notes (Signed)
 Called pt yesterday afternoon to update arrival time, when pt informed me that he wasn't feeling well. He had complaints of nausea, dizziness, SOB, and general malaise. He said that he would likely go to Urgent care or his primary doctor and asked that he be rescheduled for surgery when he is feeling better.Office and MD notified.

## 2023-10-11 NOTE — Progress Notes (Signed)
 This encounter was created in error - please disregard.  Pt never responded for his virtual visit

## 2023-10-15 ENCOUNTER — Encounter: Payer: Self-pay | Admitting: "Endocrinology

## 2023-10-15 ENCOUNTER — Ambulatory Visit (INDEPENDENT_AMBULATORY_CARE_PROVIDER_SITE_OTHER): Payer: Medicare PPO | Admitting: "Endocrinology

## 2023-10-15 VITALS — BP 132/68 | HR 60 | Ht 67.0 in | Wt 227.4 lb

## 2023-10-15 DIAGNOSIS — E782 Mixed hyperlipidemia: Secondary | ICD-10-CM

## 2023-10-15 DIAGNOSIS — E0590 Thyrotoxicosis, unspecified without thyrotoxic crisis or storm: Secondary | ICD-10-CM | POA: Diagnosis not present

## 2023-10-15 NOTE — Progress Notes (Signed)
 10/15/2023     Endocrinology follow-up note   Subjective:    Patient ID: Steven [name], male    DOB: 72-May-1952, PCP Del Nigel Berthold, FNP.   Past Medical History:  Diagnosis Date   Anxiety    Arthritis    Atrial fibrillation (HCC)    Atrial flutter (HCC)    BPH (benign prostatic hyperplasia)    Chest pain    Dizziness    Dyspnea    laying down occ   Fracture 08/17/2015   MULTIPLE RIB FRACTURES     FROM FALL    GERD (gastroesophageal reflux disease)    Hemorrhoids    History of kidney stones    noted on CT scan   History of radiation therapy 08/22/11-10/13/11   prostate   Hyperlipemia    Hypertension    Hyperthyroidism    IBS (irritable bowel syndrome)    Insomnia    Light headedness    Migraine    Numbness and tingling in left arm    Numbness and tingling of both legs    Open fracture of left elbow 08/18/2015   Pre-diabetes    Prostate cancer [facility])    prostate s/p radiation Mar 2013   Rib fractures 08/17/2015   Tinnitus     Past Surgical History:  Procedure Laterality Date   BOTOX INJECTION N/A 01/14/2020   Procedure: INJECTION OF BOTOX INTO ANAL SPHINCTER;  Surgeon: Andria Meuse, MD;  Location: [facility] ORS;  Service: General;  Laterality: N/A;   COLONOSCOPY N/A 12/19/2012   [id number] 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 Normal appearing colon   COLONOSCOPY WITH PROPOFOL N/A 10/23/2019   Procedure: COLONOSCOPY WITH PROPOFOL;  Surgeon: Corbin Ade, MD; external and grade 2 internal hemorrhoids, abnormal rectal blood vessels consistent with radiation proctitis s/p APC therapy, otherwise normal exam.   EVALUATION UNDER ANESTHESIA WITH ANAL FISTULECTOMY N/A 01/14/2020   Procedure: ANORECTAL EXAM UNDER ANESTHESIA;  Surgeon: Andria Meuse, MD;  Location: [facility]S;  Service: General;  Laterality: N/A;   HOT HEMOSTASIS  10/23/2019   Procedure: HOT HEMOSTASIS (ARGON PLASMA  COAGULATION/BICAP);  Surgeon: Corbin Ade, MD;  Location: [facility];  Service: Endoscopy;;  apc rectal proctitis     KIDNEY SURGERY  1982   kidney tube collapse repair   RADIOACTIVE SEED IMPLANT     Prostate   SVT ABLATION N/A 12/12/2022   Procedure: SVT ABLATION;  Surgeon: Marinus Maw, MD;  Location: [facility] INVASIVE CV LAB;  Service: Cardiovascular;  Laterality: N/A;    Social History   Socioeconomic History   Marital status: Single    Spouse name: Not on file   Number of children: 2   Years of education: 12   Highest education level: 12th grade  Occupational History   Occupation: Custodian     Employer: [employer]  Tobacco Use   Smoking status: Former    Current packs/day: 0.00    Average packs/day: 1 pack/day for 20.0 years (20.0 ttl pk-yrs)    Types: Cigarettes    Start date: 2000    Quit date: 2020    Years since quitting: 5.2    Passive exposure: Past   Smokeless tobacco: Never   Tobacco comments:    Quit smoking x 2 years    07/2015   SOMETIIMES i USE VAPOR     Smoking Cessation Classes, Agencies, Aeronautical engineer Use  Vaping status: Never Used  Substance and Sexual Activity   Alcohol use: Not Currently   Drug use: Never   Sexual activity: Not Currently    Partners: Female  Other Topics Concern   Not on file  Social History Narrative   Lives with friend   Divorced   No caffeine   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   Social Drivers of Corporate investment banker Strain: Low Risk  (06/25/2023)   Overall Financial Resource Strain (CARDIA)    Difficulty of Paying Living Expenses: Not hard at all  Recent Concern: Financial Resource Strain - High Risk (05/08/2023)   Overall Financial Resource Strain (CARDIA)    Difficulty of Paying Living Expenses: Hard  Food Insecurity: No Food Insecurity (06/25/2023)   Hunger Vital Sign    Worried About Running Out of Food in the Last Year: Never true    Ran Out of  Food in the Last Year: Never true  Transportation Needs: No Transportation Needs (06/25/2023)   PRAPARE - Administrator, Civil Service (Medical): No    Lack of Transportation (Non-Medical): No  Physical Activity: Sufficiently Active (06/25/2023)   Exercise Vital Sign    Days of Exercise per Week: 7 days    Minutes of Exercise per Session: 50 min  Stress: Stress Concern Present (06/25/2023)   [employer] of Occupational Health - Occupational Stress Questionnaire    Feeling of Stress : To some extent  Social Connections: Moderately Integrated (06/25/2023)   Social Connection and Isolation Panel [NHANES]    Frequency of Communication with Friends and Family: More than three times a week    Frequency of Social Gatherings with Friends and Family: More than three times a week    Attends Religious Services: More than 4 times per year    Active Member of [employer] or Organizations: No    Attends Banker Meetings: Never    Marital Status: Living with partner    Family History  Problem Relation Age of Onset   Aneurysm Mother        aortic   Hypertension Mother    Headache Mother    Prostate cancer Father    Hypertension Father    Heart failure Brother    Colon cancer Neg Hx    Colon polyps Neg Hx     Outpatient Encounter Medications as of 10/15/2023  Medication Sig   acetaminophen (TYLENOL) 325 MG tablet Take 325 mg by mouth every 6 (six) hours as needed for moderate pain (pain score 4-6).   albuterol (VENTOLIN HFA) 108 (90 Base) MCG/ACT inhaler Inhale 2 puffs into the lungs every 4 (four) hours as needed.   alfuzosin (UROXATRAL) 10 MG 24 hr tablet Take 1 tablet (10 mg total) by mouth daily with breakfast.   amLODipine (NORVASC) 10 MG tablet Take 1 tablet (10 mg total) by mouth daily.   apixaban (ELIQUIS) 5 MG TABS tablet Take 1 tablet (5 mg total) by mouth 2 (two) times daily.   azelastine (ASTELIN) 0.1 % nasal spray Place 1 spray into both nostrils 2 (two)  times daily. Use in each nostril as directed   Azelastine-Fluticasone 137-50 MCG/ACT SUSP Place 1 spray into the nose every 12 (twelve) hours. (Patient not taking: Reported on 10/05/2023)   benzonatate (TESSALON) 200 MG capsule Take 1 capsule (200 mg total) by mouth 3 (three) times daily as needed for cough. (Patient not taking: Reported on 10/04/2023)   Cholecalciferol (VITAMIN D3) 125  MCG (5000 UT) CAPS Take 5,000 Units by mouth daily.   cloNIDine (CATAPRES) 0.1 MG tablet Take 1 tablet (0.1 mg total) by mouth 2 (two) times daily as needed (SBP>160).   diazepam (VALIUM) 2 MG tablet Take 1 tablet by mouth prior to MRI. (Patient not taking: Reported on 10/04/2023)   DULoxetine (CYMBALTA) 60 MG capsule Take 1 capsule (60 mg total) by mouth daily.   ezetimibe (ZETIA) 10 MG tablet Take 1 tablet (10 mg total) by mouth daily.   furosemide (LASIX) 20 MG tablet Take 1 tablet (20 mg total) by mouth daily. Take with or after meal.   Galcanezumab-gnlm (EMGALITY) 120 MG/ML SOAJ Inject 1 Pen into the skin every 30 (thirty) days. (Patient not taking: Reported on 10/04/2023)   hydrOXYzine (ATARAX) 10 MG tablet Take 1 tablet (10 mg total) by mouth 3 (three) times daily as needed for anxiety. (Patient not taking: Reported on 10/04/2023)   hyoscyamine (LEVBID) 0.375 MG 12 hr tablet Take 0.375 mg by mouth every 12 (twelve) hours as needed for cramping.   loratadine (CLARITIN) 10 MG tablet Take 1 tablet (10 mg total) by mouth daily.   losartan (COZAAR) 100 MG tablet Take 100 mg by mouth daily.   methimazole (TAPAZOLE) 5 MG tablet Take 0.5 tablets (2.5 mg total) by mouth daily.   metoprolol tartrate (LOPRESSOR) 25 MG tablet Take 1 tablet (25 mg total) by mouth 2 (two) times daily. TAKE EXTRA HALF TABLET  AS NEEDED   mirtazapine (REMERON) 15 MG tablet Take 1 tablet (15 mg total) by mouth at bedtime.   mirtazapine (REMERON) 7.5 MG tablet TAKE 1 TABLET(7.5 MG) BY MOUTH AT BEDTIME (Patient not taking: Reported on 10/05/2023)    NURTEC 75 MG TBDP Take 75 mg by mouth daily as needed (migraine).   omeprazole (PRILOSEC) 20 MG capsule Take 20 mg by mouth daily.   ondansetron (ZOFRAN) 4 MG tablet Take 1 tablet (4 mg total) by mouth every 8 (eight) hours as needed for nausea or vomiting.   polyethylene glycol (MIRALAX / GLYCOLAX) 17 g packet Take 17 g by mouth daily.   potassium chloride SA (KLOR-CON M) 20 MEQ tablet Take 1 tablet (20 mEq total) by mouth daily.   pregabalin (LYRICA) 150 MG capsule Take 1 capsule (150 mg total) by mouth 2 (two) times daily.   tiZANidine (ZANAFLEX) 4 MG tablet Take 1 tablet (4 mg total) by mouth every 6 (six) hours as needed for muscle spasms. (Patient not taking: Reported on 10/05/2023)   triamcinolone cream (KENALOG) 0.1 % Apply 1 Application topically 2 (two) times daily. (Patient taking differently: Apply 1 Application topically 2 (two) times daily as needed (rash).)   Ubrogepant (UBRELVY) 100 MG TABS Take 1 tablet (100 mg total) by mouth as needed (for headaches). May repeat a dose in 2 hours if needed. Max dose 2 pills in 24 hours   vitamin B-12 (CYANOCOBALAMIN) 100 MCG tablet Take 100 mcg by mouth daily.   [DISCONTINUED] DULoxetine (CYMBALTA) 30 MG capsule Take 1 capsule (30 mg total) by mouth daily. (Patient not taking: Reported on 10/04/2023)   [DISCONTINUED] gabapentin (NEURONTIN) 300 MG capsule Take 1 capsule (300 mg total) by mouth 3 (three) times daily as needed.   No facility-administered encounter medications on file as of 10/15/2023.    ALLERGIES: Allergies  Allergen Reactions   Latex Rash and Other (See Comments)    Possible reaction to latex gloves per patient   Meclizine Other (See Comments)    Other reaction(s): Abdominal  Pain, Other    VACCINATION STATUS: Immunization History  Administered Date(s) Administered   Fluad Quad(high Dose 65+) 05/07/2023   Influenza,inj,quad, With Preservative 04/03/2019   Influenza-Unspecified 05/26/2020   Moderna Sars-Covid-2 Vaccination  09/07/2019, 10/12/2019     HPI  JOHNNELL LIOU is 73 y.o. male who was previously seen in 2021 for subclinical hypothyroidism and mild hypocortisolemia.  His subsequent workup did not reveal adrenal dysfunction.  After appropriate workup, he was diagnosed with hypothyroidism, without elevated thyroid uptake and scan evidence.  He was put on methimazole which was progressively lowered to 2.5 mg p.o. daily with breakfast with clinical and biochemical response.  He has no new complaints today.  His previous thyrotoxicosis symptoms have resolved.    He presents with thyroid function test consistent with treatment effect towards target.   He presents with progressive weight regain, lately stabilizing.    he denies dysphagia, choking, shortness of breath, no recent voice change.    he denies  family history of thyroid dysfunction. He  denies family hx of thyroid cancer. he denies personal history of goiter.                           Review of systems  Limited as above.   Objective:    BP 132/68   Pulse 60   Ht 5\' 7"  (1.702 m)   Wt 227 lb 6.4 oz (103.1 kg)   BMI 35.62 kg/m   Wt Readings from Last 3 Encounters:  10/15/23 227 lb 6.4 oz (103.1 kg)  10/09/23 230 lb (104.3 kg)  07/12/23 213 lb (96.6 kg)     His physical exam is negative for goiter.  CMP     Component Value Date/Time   NA 141 10/09/2023 1311   NA 141 04/06/2023 1135   K 3.3 (L) 10/09/2023 1311   CL 103 10/09/2023 1311   CO2 28 10/09/2023 1311   GLUCOSE 100 (H) 10/09/2023 1311   BUN 9 10/09/2023 1311   BUN 10 04/06/2023 1135   CREATININE 0.90 10/09/2023 1311   CALCIUM 9.0 10/09/2023 1311   PROT 6.8 01/07/2023 1953   ALBUMIN 3.6 01/07/2023 1953   AST 15 01/07/2023 1953   ALT 12 01/07/2023 1953   ALKPHOS 68 01/07/2023 1953   BILITOT 0.4 01/07/2023 1953   GFRNONAA >60 10/09/2023 1311   GFRAA >60 02/19/2020 1622     CBC    Component Value Date/Time   WBC 4.3 06/04/2023 1615   WBC 4.1 04/19/2023 2150    RBC 4.90 06/04/2023 1615   RBC 5.01 04/19/2023 2150   HGB 14.2 06/04/2023 1615   HCT 43.1 06/04/2023 1615   PLT 213 06/04/2023 1615   MCV 88 06/04/2023 1615   MCH 29.0 06/04/2023 1615   MCH 28.3 04/19/2023 2150   MCHC 32.9 06/04/2023 1615   MCHC 31.6 04/19/2023 2150   RDW 12.4 06/04/2023 1615   LYMPHSABS 1.8 06/04/2023 1615   MONOABS 0.2 01/07/2023 1953   EOSABS 0.1 06/04/2023 1615   BASOSABS 0.0 06/04/2023 1615   Lipid Panel     Component Value Date/Time   CHOL 165 10/09/2023 1551   TRIG 139 10/09/2023 1551   HDL 45 10/09/2023 1551   CHOLHDL 3.7 10/09/2023 1551   CHOLHDL 4.4 03/02/2019 0329   VLDL 18 03/02/2019 0329   LDLCALC 95 10/09/2023 1551   LABVLDL 25 10/09/2023 1551     Lab Results  Component Value Date   TSH 3.870 10/09/2023  TSH 4.130 06/06/2023   TSH 7.020 (H) 03/08/2023   TSH 8.490 (H) 01/12/2023   TSH 3.950 12/20/2022   TSH 0.119 (L) 10/06/2022   TSH 0.015 (L) 09/11/2022   TSH 0.011 (L) 09/08/2022   TSH 0.018 (L) 08/22/2022   TSH 0.030 (L) 08/13/2022   FREET4 1.00 10/09/2023   FREET4 1.22 06/06/2023   FREET4 0.77 (L) 03/08/2023   FREET4 0.97 01/12/2023   FREET4 1.10 12/20/2022   FREET4 1.48 10/06/2022   FREET4 1.77 09/11/2022   FREET4 2.16 (H) 08/21/2022   FREET4 1.71 (H) 08/01/2022   FREET4 1.19 07/14/2020    Thyroid uptake and scan on Nov 28, 2019 FINDINGS: There is fairly homogeneous thyroid uptake. The left lobe extends more inferiorly than the right, although no focal hot or cold nodules are identified.   4 hour I-123 uptake = 4.1% (normal 5-20%)  24 hour I-123 uptake = 12.9% (normal 10-30%)   IMPRESSION: 1. Low normal radioactive iodine uptake by the thyroid gland. 2. No focal hot or cold nodules identified.    Assessment & Plan:   1. Hyperthyroidism-  2.  Hyperlipidemia    3.  Prediabetes   4.  Hypervitaminosis B12 His previsit labs are consistent with treatment effect with improved thyroid function profile.  He would  benefit from staying on low-dose methimazole currently at 2.5 mg daily with breakfast.     He will return to clinic for follow-up with repeat thyroid function tests in 6 months.   His recent adrenal assessment has indicated  sufficient adrenal response including random cortisol and ACTH stimulation test ruling out adrenal insufficiency.  In light of his metabolic dysfunction indicated by prediabetes, hyperlipidemia, whole food plant based diet was discussed and recommended to him.    He is hesitant to take statins, however have taken his Zetia 10 mg p.o. daily at breakfast with improved lipid panel.    -He is also advised to stay off of his vitamin B12 supplements, labs show normal indicate better levels at 652.    -Patient is advised to maintain close follow up with Del Nigel Berthold, FNP for primary care needs.   I spent  22  minutes in the care of the patient today including review of labs from Thyroid Function, CMP, and other relevant labs ; imaging/biopsy records (current and previous including abstractions from other facilities); face-to-face time discussing  his lab results and symptoms, medications doses, his options of short and long term treatment based on the latest standards of care / guidelines;   and documenting the encounter.  Steven [name]  participated in the discussions, expressed understanding, and voiced agreement with the above plans.  All questions were answered to his satisfaction. he is encouraged to contact clinic should he have any questions or concerns prior to his return visit.   Follow up plan: Return in about 6 months (around 04/16/2024) for F/U with Pre-visit Labs.   Thank you for involving me in the care of this pleasant patient, and I will continue to update you with his progress.  Marquis Lunch, MD [facility] Phone: [phone]  Fax: [phone]   10/15/2023, 11:23 AM  This note was partially  dictated with voice recognition software. Similar sounding words can be transcribed inadequately or may not  be corrected upon review.

## 2023-10-18 ENCOUNTER — Ambulatory Visit (INDEPENDENT_AMBULATORY_CARE_PROVIDER_SITE_OTHER): Payer: Medicare PPO | Admitting: Family Medicine

## 2023-10-18 ENCOUNTER — Encounter: Payer: Self-pay | Admitting: Family Medicine

## 2023-10-18 VITALS — BP 138/76 | HR 55 | Ht 67.0 in | Wt 228.1 lb

## 2023-10-18 DIAGNOSIS — Z0182 Encounter for allergy testing: Secondary | ICD-10-CM | POA: Diagnosis not present

## 2023-10-18 DIAGNOSIS — R21 Rash and other nonspecific skin eruption: Secondary | ICD-10-CM | POA: Diagnosis not present

## 2023-10-18 DIAGNOSIS — I10 Essential (primary) hypertension: Secondary | ICD-10-CM

## 2023-10-18 NOTE — Patient Instructions (Addendum)
   Great to see you today.  I have refilled the medication(s) we provide.    Dermatology # [phone]   If labs were collected, we will inform you of lab results once received either by echart message or telephone call.   - echart message- for normal results that have been seen by the patient already.   - telephone call: abnormal results or if patient has not viewed results in their echart.   - Please take medications as prescribed. - Follow up with your primary health provider if any health concerns arises. - If symptoms worsen please contact your primary care provider and/or visit the emergency department.

## 2023-10-18 NOTE — Progress Notes (Signed)
 Established Patient Office Visit   Subjective  Patient ID: Steven [name], male    DOB: 04-10-1951  Age: 73 y.o. MRN: [id number]  Chief Complaint  Patient presents with   Medical Management of Chronic Issues    4 month follow up w/ labs  Has been seeing higher B/P at home and is concerned. States it runs higher when he is laying down, as well as in the mornings.  Chronic breakouts around the mouth. Seems to return after medication course is complete . Order lung cancer screening     He  has a past medical history of Anxiety, Arthritis, Atrial fibrillation (HCC), Atrial flutter (HCC), BPH (benign prostatic hyperplasia), Chest pain, Dizziness, Dyspnea, Fracture (08/17/2015), GERD (gastroesophageal reflux disease), Hemorrhoids, History of kidney stones, History of radiation therapy (08/22/11-10/13/11), Hyperlipemia, Hypertension, Hyperthyroidism, IBS (irritable bowel syndrome), Insomnia, Light headedness, Migraine, Numbness and tingling in left arm, Numbness and tingling of both legs, Open fracture of left elbow (08/18/2015), Pre-diabetes, Prostate cancer (HCC), Rib fractures (08/17/2015), and Tinnitus.  Rash The patient presents with a recurrent, waxing and waning rash that has been occurring for several months. The rash clears temporarily with antibiotics but recurs. The patient has used triamcinolone cream (Kenalog) 0.1% with mild relief. Facial rash is diffuse and appears to be associated with shaving cream. Additional symptoms include facial edema, while congestion, cough, fever, and shortness of breath are absent. Past treatments include topical steroids and oral antibiotics, which provided significant but temporary relief, as the facial rash continues to recur. Past medical history is notable for allergies, with no history of eczema.    Review of Systems  Constitutional:  Negative for fever.  HENT:  Negative for congestion.   Respiratory:  Negative for cough and shortness of breath.    Skin:  Positive for rash.      Objective:     BP 138/76   Pulse (!) 55   Ht 5\' 7"  (1.702 m)   Wt 228 lb 1.9 oz (103.5 kg)   SpO2 97%   BMI 35.73 kg/m  BP Readings from Last 3 Encounters:  10/18/23 138/76  10/15/23 132/68  10/09/23 131/73      Physical Exam Vitals reviewed.  Constitutional:      General: He is not in acute distress.    Appearance: Normal appearance. He is not ill-appearing, toxic-appearing or diaphoretic.  HENT:     Head: Normocephalic.  Eyes:     General:        Right eye: No discharge.        Left eye: No discharge.     Conjunctiva/sclera: Conjunctivae normal.  Cardiovascular:     Rate and Rhythm: Normal rate.     Pulses: Normal pulses.     Heart sounds: Normal heart sounds.  Pulmonary:     Effort: Pulmonary effort is normal. No respiratory distress.     Breath sounds: Normal breath sounds.  Abdominal:     General: Bowel sounds are normal.     Palpations: Abdomen is soft.     Tenderness: There is no abdominal tenderness. There is no right CVA tenderness, left CVA tenderness or guarding.  Skin:    General: Skin is warm and dry.     Capillary Refill: Capillary refill takes less than 2 seconds.     Findings: Rash present.     Comments: Facial rash  Neurological:     Mental Status: He is alert.     Coordination: Coordination normal.     Gait:  Gait normal.  Psychiatric:        Mood and Affect: Mood normal.        Behavior: Behavior normal.      No results found for any visits on 10/18/23.  The 10-year ASCVD risk score (Arnett DK, et al., 2019) is: 22.4%    Assessment & Plan:  Encounter for allergy testing -     Ambulatory referral to Allergy  Rash of unknown cause -     Ambulatory referral to Dermatology  Primary hypertension Assessment & Plan: Vitals:   10/18/23 1346 10/18/23 1449  BP: (!) 144/75 138/76  Patient reports elevated blood pressure ar home ranging around 140-150/90s Current regimen includes metoprolol tartrate  50 mg twice daily  Amlodipine 10 mg once daily, Losartan 100 mg once daily Advise patient to follow up in 2 weeks with at home blood pressure readings to monitor trends Continued discussion on DASH diet, low sodium diet and maintain a exercise routine for 150 minutes per week.   Orders: -     CBC with Differential/Platelet  Rash and nonspecific skin eruption Assessment & Plan: Facial rash unknown etiology referral placed to dermatology -  slight facial redness and flat papules surround the bilateral cheek area.  Discussed  apply a thick, fragrance-free moisturizer immediately after washing hands and avoid using harsh soaps or irritants. Wear protective gloves during activities involving water or cleaning products to prevent flare-ups.       Return in about 4 months (around 02/17/2024), or if symptoms worsen or fail to improve, for chronic follow-up.   Cruzita Lederer Newman Nip, FNP

## 2023-10-18 NOTE — Assessment & Plan Note (Signed)
 Facial rash unknown etiology referral placed to dermatology -  slight facial redness and flat papules surround the bilateral cheek area.  Discussed  apply a thick, fragrance-free moisturizer immediately after washing hands and avoid using harsh soaps or irritants. Wear protective gloves during activities involving water or cleaning products to prevent flare-ups.

## 2023-10-18 NOTE — Assessment & Plan Note (Signed)
 Vitals:   10/18/23 1346 10/18/23 1449  BP: (!) 144/75 138/76  Patient reports elevated blood pressure ar home ranging around 140-150/90s Current regimen includes metoprolol tartrate 50 mg twice daily  Amlodipine 10 mg once daily, Losartan 100 mg once daily Advise patient to follow up in 2 weeks with at home blood pressure readings to monitor trends Continued discussion on DASH diet, low sodium diet and maintain a exercise routine for 150 minutes per week.

## 2023-10-19 ENCOUNTER — Encounter: Payer: Self-pay | Admitting: Family Medicine

## 2023-10-19 ENCOUNTER — Ambulatory Visit: Payer: Medicare PPO | Admitting: Urology

## 2023-10-19 LAB — CBC WITH DIFFERENTIAL/PLATELET
Basophils Absolute: 0 10*3/uL (ref 0.0–0.2)
Basos: 1 %
EOS (ABSOLUTE): 0.1 10*3/uL (ref 0.0–0.4)
Eos: 3 %
Hematocrit: 40.6 % (ref 37.5–51.0)
Hemoglobin: 13 g/dL (ref 13.0–17.7)
Immature Grans (Abs): 0 10*3/uL (ref 0.0–0.1)
Immature Granulocytes: 0 %
Lymphocytes Absolute: 2.3 10*3/uL (ref 0.7–3.1)
Lymphs: 57 %
MCH: 27.6 pg (ref 26.6–33.0)
MCHC: 32 g/dL (ref 31.5–35.7)
MCV: 86 fL (ref 79–97)
Monocytes Absolute: 0.4 10*3/uL (ref 0.1–0.9)
Monocytes: 10 %
Neutrophils Absolute: 1.2 10*3/uL — ABNORMAL LOW (ref 1.4–7.0)
Neutrophils: 29 %
Platelets: 247 10*3/uL (ref 150–450)
RBC: 4.71 x10E6/uL (ref 4.14–5.80)
RDW: 12.8 % (ref 11.6–15.4)
WBC: 4.1 10*3/uL (ref 3.4–10.8)

## 2023-10-19 NOTE — Telephone Encounter (Signed)
 I tried to reach patient to inform him that per MD he is ok to proceed with 04/24 procedure date.  No answer at the time of call.  I left a vm requesting a call back, direct call back number provided.  I will document further communication under surgery referral regarding procedure.

## 2023-10-22 ENCOUNTER — Other Ambulatory Visit: Payer: Self-pay | Admitting: Family Medicine

## 2023-10-23 ENCOUNTER — Telehealth: Payer: Self-pay

## 2023-10-23 NOTE — Telephone Encounter (Signed)
 Spoke with patient. He is aware of procedure date.  Follow up and surgery details mailed to patient.

## 2023-10-23 NOTE — Telephone Encounter (Signed)
 Patient returned call to office to discuss biopsy.  Message sent to clinic to advise-and return call back to patient.

## 2023-10-25 DIAGNOSIS — L710 Perioral dermatitis: Secondary | ICD-10-CM | POA: Diagnosis not present

## 2023-10-30 ENCOUNTER — Other Ambulatory Visit: Payer: Self-pay | Admitting: Internal Medicine

## 2023-10-30 DIAGNOSIS — I482 Chronic atrial fibrillation, unspecified: Secondary | ICD-10-CM

## 2023-10-30 DIAGNOSIS — I4892 Unspecified atrial flutter: Secondary | ICD-10-CM

## 2023-10-30 DIAGNOSIS — I471 Supraventricular tachycardia, unspecified: Secondary | ICD-10-CM

## 2023-11-12 ENCOUNTER — Encounter (HOSPITAL_COMMUNITY): Payer: Self-pay

## 2023-11-12 ENCOUNTER — Encounter (HOSPITAL_COMMUNITY)
Admission: RE | Admit: 2023-11-12 | Discharge: 2023-11-12 | Disposition: A | Discharge status: Home / Self Care | Source: Ambulatory Visit | Attending: Urology | Admitting: Urology

## 2023-11-12 ENCOUNTER — Telehealth: Payer: Self-pay | Admitting: Cardiovascular Disease

## 2023-11-12 HISTORY — DX: Cardiac arrhythmia, unspecified: I49.9

## 2023-11-12 NOTE — Telephone Encounter (Signed)
 Left message for patient to call back.  Per Clearance Note "10/08/23" Per office protocol, patient can hold Eliquis  for 3 days prior to procedure.

## 2023-11-12 NOTE — Telephone Encounter (Signed)
 Pt c/o BP issue: STAT if pt c/o blurred vision, one-sided weakness or slurred speech.  STAT if BP is GREATER than 180/120 TODAY.  STAT if BP is LESS than 90/60 and SYMPTOMATIC TODAY  1. What is your BP concern? BP is running 140-150's/80's  2. Have you taken any BP medication today? Yes  3. What are your last 5 BP readings? 11/11/23 144/80 around 11 pm   4. Are you having any other symptoms (ex. Dizziness, headache, blurred vision, passed out)? Weakness in legs  States PCP has him recording readings for 2 weeks that he is to turn into them, but did not have sheet to report readings at time of call. Patient also c/o blurred vision, but is unsure if it is due to the pollen. He also states he was told he has cataracts that are not severe enough for surgery.   Patient is stopping taking blood thinner today for upcoming procedure on 04/24.  Advised a surgical clearance request is needed in order to advise patient on when to begin blood thinners again. He verbalized understanding. Please advise.

## 2023-11-12 NOTE — Patient Instructions (Signed)
 Steven [name]  11/12/2023     @PREFPERIOPPHARMACY @   Your procedure is scheduled on 11/15/2023.   Report to [facility] at 8:30 A.M.   Call this number if you have problems the morning of surgery:   [phone]  If you experience any cold or flu symptoms such as cough, fever, chills, shortness of breath, etc. between now and your scheduled surgery, please notify us  at the above number.   Remember:   Do not eat after midnight.   You may drink clear liquids until 6:30 AM .  Clear liquids allowed are:                    Water , Carbonated beverages (diabetics please choose diet or no sugar options), Clear Tea (No creamer, milk, or cream, including half & half and powdered creamer), Black Coffee Only (No creamer, milk or cream, including half & half and powdered creamer), and Clear Sports drink (No red color; diabetics please choose diet or no sugar options)    Take these medicines the morning of surgery with A SIP OF WATER  : Amlodipine  Alfuzosin  Duloxetine  Claritin  Methimazole  Metoprolol  Omeprazole  and Lyrica     Do not wear jewelry, make-up or nail polish, including gel polish,  artificial nails, or any other type of covering on natural nails (fingers and  toes).  Do not wear lotions, powders, or perfumes, or deodorant.  Do not shave 48 hours prior to surgery.  Men may shave face and neck.  Do not bring valuables to the hospital.  [facility] is not responsible for any belongings or valuables.  Contacts, dentures or bridgework may not be worn into surgery.  Leave your suitcase in the car.  After surgery it may be brought to your room.  For patients admitted to the hospital, discharge time will be determined by your treatment team.  Patients discharged the day of surgery will not be allowed to drive home.   Name and phone number of your driver:   Family  Special instructions:  N/A  Please read over the following fact sheets that you were given.  Care and Recovery After  Surgery   Transrectal Ultrasound-Guided Prostate Biopsy A transrectal ultrasound-guided prostate biopsy is a procedure to remove samples of prostate tissue for testing. The prostate is a walnut-sized gland that is located below the bladder and in front of the rectum. During this procedure, a small device (probe) is lubricated and put inside the rectum. The probe sends out sound waves that make a picture of the prostate and surrounding tissues (transrectal ultrasound). The images are used to help guide the process of removing the samples. The samples are taken to a lab to be checked for prostate cancer. This procedure is usually done to evaluate the prostate gland of men who have raised (elevated) levels of prostate-specific antigen (PSA), which can be a sign of prostate cancer or prostate enlargement related to aging (benign prostatic hyperplasia, or BPH). Tell a health care provider about: Any allergies you have. All medicines you are taking, including vitamins, herbs, eye drops, creams, and over-the-counter medicines. Any problems you or family members have had with anesthetic medicines. Any bleeding problems you have. Any surgeries you have had. Any medical conditions you have. Any prostate infections you have had. What are the risks? Generally, this is a safe procedure. However, problems may occur, including: Prostate infection. Bleeding from the rectum. Blood in the urine. Allergic reactions to medicines. Damage to surrounding structures such as blood  vessels, organs, or muscles. Difficulty passing urine. Nerve damage. This is usually temporary. What happens before the procedure? Medicines Ask your health care provider about: Changing or stopping your regular medicines. This is especially important if you are taking diabetes medicines or blood thinners. Taking medicines such as aspirin  and ibuprofen. These medicines can thin your blood. Do not take these medicines unless your health  care provider tells you to take them. Taking over-the-counter medicines, vitamins, herbs, and supplements. General instructions Follow instructions from your health care provider about eating and drinking. In most instances, you will not need to stop eating and drinking completely before the procedure. You will be given an enema. During an enema, a liquid is injected into your rectum to clear out waste. You may have a blood or urine sample taken. Ask your health care provider what steps will be taken to help prevent infection. These steps may include: Washing skin with a germ-killing soap. Taking antibiotic medicine. If you will be going home right after the procedure, plan to have a responsible adult: Take you home from the hospital or clinic. You will not be allowed to drive. Care for you for the time you are told. What happens during the procedure?  An IV will be inserted into one of your veins. You will be given one or both of the following: A medicine to help you relax (sedative). A medicine to numb the area (local anesthetic). You will be placed on your left side, and your knees will be bent toward your chest. A probe with lubricated gel will be placed into your rectum, and images will be taken of your prostate and surrounding structures. Numbing medicine will be injected into your prostate. A biopsy needle will be inserted through your rectum or perineum and guided to your prostate using the ultrasound images. Prostate tissue samples will be removed, and the needle and probe will then be removed. The biopsy samples will be sent to a lab to be tested. The procedure may vary among health care providers and hospitals. What happens after the procedure? Your blood pressure, heart rate, breathing rate, and blood oxygen  level will be monitored until you leave the hospital or clinic. You may have some discomfort in the rectal area. You will be given pain medicine as needed. If you were  given a sedative during the procedure, it can affect you for several hours. Do not drive or operate machinery until your health care provider says that it is safe. It is up to you to get the results of your procedure. Ask your health care provider, or the department that is doing the procedure, when your results will be ready. Keep all follow-up visits. This is important. Summary A transrectal ultrasound-guided biopsy removes samples of tissue from your prostate using ultrasound-guided sound waves to help guide the process. This procedure is usually done to evaluate the prostate gland of men who have raised (elevated) levels of prostate-specific antigen (PSA), which can be a sign of prostate cancer or prostate enlargement related to aging. After your procedure, you may feel some discomfort in the rectal area. Plan to have a responsible adult take you home from the hospital or clinic, and follow up with your health care provider for your results. This information is not intended to replace advice given to you by your health care provider. Make sure you discuss any questions you have with your health care provider. Document Revised: 01/03/2021 Document Reviewed: 01/03/2021 Elsevier Patient Education  2024 [employer].  General Anesthesia, Adult General anesthesia is the use of medicine to make you fall asleep (unconscious) for a medical procedure. General anesthesia must be used for certain procedures. It is often recommended for surgery or procedures that: Last a long time. Require you to be still or in an unusual position. Are major and can cause blood loss. Affect your breathing. The medicines used for general anesthesia are called general anesthetics. During general anesthesia, these medicines are given along with medicines that: Prevent pain. Control your blood pressure. Relax your muscles. Prevent nausea and vomiting after the procedure. Tell a health care provider about: Any allergies  you have. All medicines you are taking, including vitamins, herbs, eye drops, creams, and over-the-counter medicines. Your history of any: Medical conditions you have, including: High blood pressure. Bleeding problems. Diabetes. Heart or lung conditions, such as: Heart failure. Sleep apnea. Asthma. Chronic obstructive pulmonary disease (COPD). Current or recent illnesses, such as: Upper respiratory, chest, or ear infections. Cough or fever. Tobacco or drug use, including marijuana or alcohol use. Depression or anxiety. Surgeries and types of anesthetics you have had. Problems you or family members have had with anesthetic medicines. Whether you are pregnant or may be pregnant. Whether you have any chipped or loose teeth, dentures, caps, bridgework, or issues with your mouth, swallowing, or choking. What are the risks? Your health care provider will talk with you about risks. These may include: Allergic reaction to the medicines. Lung and heart problems. Inhaling food or liquid from the stomach into the lungs (aspiration). Nerve injury. Injury to the lips, mouth, teeth, or gums. Stroke. Waking up during your procedure and being unable to move. This is rare. These problems are more likely to develop if you are having a major surgery or if you have an advanced or serious medical condition. You can prevent some of these complications by answering all of your health care provider's questions thoroughly and by following all instructions before your procedure. General anesthesia can cause side effects, including: Nausea or vomiting. A sore throat or hoarseness from the breathing tube. Wheezing or coughing. Shaking chills or feeling cold. Body aches. Sleepiness. Confusion, agitation (delirium), or anxiety. What happens before the procedure? When to stop eating and drinking Follow instructions from your health care provider about what you may eat and drink before your procedure. If  you do not follow your health care provider's instructions, your procedure may be delayed or canceled. Medicines Ask your health care provider about: Changing or stopping your regular medicines. These include any diabetes medicines or blood thinners you take. Taking medicines such as aspirin  and ibuprofen. These medicines can thin your blood. Do not take them unless your health care provider tells you to. Taking over-the-counter medicines, vitamins, herbs, and supplements. General instructions Do not use any products that contain nicotine or tobacco for at least 4 weeks before the procedure. These products include cigarettes, chewing tobacco, and vaping devices, such as e-cigarettes. If you need help quitting, ask your health care provider. If you brush your teeth on the morning of the procedure, make sure to spit out all of the water  and toothpaste. If told by your health care provider, bring your sleep apnea device with you to surgery (if applicable). If you will be going home right after the procedure, plan to have a responsible adult: Take you home from the hospital or clinic. You will not be allowed to drive. Care for you for the time you are told. What happens during the procedure?  An IV will be inserted into one of your veins. You will be given one or more of the following through a face mask or IV: A sedative. This helps you relax. Anesthesia. This will: Numb certain areas of your body. Make you fall asleep for surgery. After you are unconscious, a breathing tube may be inserted down your throat to help you breathe. This will be removed before you wake up. An anesthesia provider, such as an anesthesiologist, will stay with you throughout your procedure. The anesthesia provider will: Keep you comfortable and safe by continuing to give you medicines and adjusting the amount of medicine that you get. Monitor your blood pressure, heart rate, and oxygen  levels to make sure that the  anesthetics do not cause any problems. The procedure may vary among health care providers and hospitals. What happens after the procedure? Your blood pressure, temperature, heart rate, breathing rate, and blood oxygen  level will be monitored until you leave the hospital or clinic. You will wake up in a recovery area. You may wake up slowly. You may be given medicine to help you with pain, nausea, or any other side effects from the anesthesia. Summary General anesthesia is the use of medicine to make you fall asleep (unconscious) for a medical procedure. Follow your health care provider's instructions about when to stop eating, drinking, or taking certain medicines before your procedure. Plan to have a responsible adult take you home from the hospital or clinic. This information is not intended to replace advice given to you by your health care provider. Make sure you discuss any questions you have with your health care provider. Document Revised: 10/06/2021 Document Reviewed: 10/06/2021 Elsevier Patient Education  2024 [employer]  How to Use Chlorhexidine  at Home in the Shower Chlorhexidine  gluconate (CHG) is a germ-killing (antiseptic) wash that's used to clean the skin. It can get rid of the germs that normally live on the skin and can keep them away for about 24 hours. If you're having surgery, you may be told to shower with CHG at home the night before surgery. This can help lower your risk for infection. To use CHG wash in the shower, follow the steps below. Supplies needed: CHG body wash. Clean washcloth. Clean towel. How to use CHG in the shower Follow these steps unless you're told to use CHG in a different way: Start the shower. Use your normal soap and shampoo to wash your face and hair. Turn off the shower or move out of the shower stream. Pour CHG onto a clean washcloth. Do not use any type of brush or rough sponge. Start at your neck, washing your body down to your toes.  Make sure you: Wash the part of your body where the surgery will be done for at least 1 minute. Do not scrub. Do not use CHG on your head or face unless your health care provider tells you to. If it gets into your ears or eyes, rinse them well with water . Do not wash your genitals with CHG. Wash your back and under your arms. Make sure to wash skin folds. Let the CHG sit on your skin for 1-2 minutes or as long as told. Rinse your entire body in the shower, including all body creases and folds. Turn off the shower. Dry off with a clean towel. Do not put anything on your skin afterward, such as powder, lotion, or perfume. Put on clean clothes or pajamas. If it's the night before surgery, sleep in clean sheets.  General tips Use CHG only as told, and follow the instructions on the label. Use the full amount of CHG as told. This is often one bottle. Do not smoke and stay away from flames after using CHG. Your skin may feel sticky after using CHG. This is normal. The sticky feeling will go away as the CHG dries. Do not use CHG: If you have a chlorhexidine  allergy or have reacted to chlorhexidine  in the past. On open wounds or areas of skin that have broken skin, cuts, or scrapes. On babies younger than 57 months of age. Contact a health care provider if: You have questions about using CHG. Your skin gets irritated or itchy. You have a rash after using CHG. You swallow any CHG. Call your local poison control center [phone] in the [place] Your eyes itch badly, or they become very red or swollen. Your hearing changes. You have trouble seeing. If you can't reach your provider, go to an urgent care or emergency room. Do not drive yourself. Get help right away if: You have swelling or tingling in your mouth or throat. You make high-pitched whistling sounds when you breathe, most often when you breathe out (wheeze). You have trouble breathing. These symptoms may be an emergency. Call 911  right away. Do not wait to see if the symptoms will go away. Do not drive yourself to the hospital. This information is not intended to replace advice given to you by your health care provider. Make sure you discuss any questions you have with your health care provider. Document Revised: 01/23/2023 Document Reviewed: 01/19/2022 Elsevier Patient Education  2024 [employer].

## 2023-11-12 NOTE — Telephone Encounter (Signed)
 Spoke with patient and he states he is having procedure done on April 24 and wanted to know about holding eliquis .   He has been cleared by preop 3/17  He also states his BP has been elevated lately. PCP has him monitoring BP.  He was in waiting area for pre-admission and needed to hang up he would like a call back

## 2023-11-14 ENCOUNTER — Other Ambulatory Visit: Payer: Self-pay | Admitting: Family Medicine

## 2023-11-14 MED ORDER — GENTAMICIN SULFATE 40 MG/ML IJ SOLN
5.0000 mg/kg | Freq: Once | INTRAVENOUS | Status: AC
  Administered 2023-11-15: 400 mg via INTRAVENOUS
  Filled 2023-11-14: qty 10

## 2023-11-14 MED ORDER — GENTAMICIN SULFATE 40 MG/ML IJ SOLN
5.0000 mg/kg | INTRAVENOUS | Status: DC
  Filled 2023-11-14: qty 10

## 2023-11-15 ENCOUNTER — Ambulatory Visit (HOSPITAL_COMMUNITY): Payer: Self-pay

## 2023-11-15 ENCOUNTER — Ambulatory Visit (HOSPITAL_COMMUNITY)
Admission: RE | Admit: 2023-11-15 | Discharge: 2023-11-15 | Disposition: A | Discharge status: Home / Self Care | Source: Ambulatory Visit | Attending: Urology | Admitting: Urology

## 2023-11-15 ENCOUNTER — Encounter (HOSPITAL_COMMUNITY): Payer: Self-pay | Admitting: Urology

## 2023-11-15 ENCOUNTER — Other Ambulatory Visit: Payer: Self-pay | Admitting: Urology

## 2023-11-15 ENCOUNTER — Ambulatory Visit (HOSPITAL_COMMUNITY)
Admission: RE | Admit: 2023-11-15 | Discharge: 2023-11-15 | Disposition: A | Discharge status: Home / Self Care | Attending: Urology | Admitting: Urology

## 2023-11-15 ENCOUNTER — Other Ambulatory Visit: Payer: Self-pay

## 2023-11-15 ENCOUNTER — Encounter (HOSPITAL_COMMUNITY)
Admission: RE | Disposition: A | Discharge status: Home / Self Care | Payer: Self-pay | Source: Home / Self Care | Attending: Urology

## 2023-11-15 ENCOUNTER — Ambulatory Visit (HOSPITAL_BASED_OUTPATIENT_CLINIC_OR_DEPARTMENT_OTHER): Payer: Self-pay

## 2023-11-15 DIAGNOSIS — R972 Elevated prostate specific antigen [PSA]: Secondary | ICD-10-CM

## 2023-11-15 DIAGNOSIS — I10 Essential (primary) hypertension: Secondary | ICD-10-CM | POA: Diagnosis not present

## 2023-11-15 DIAGNOSIS — R9721 Rising PSA following treatment for malignant neoplasm of prostate: Secondary | ICD-10-CM | POA: Diagnosis not present

## 2023-11-15 DIAGNOSIS — F418 Other specified anxiety disorders: Secondary | ICD-10-CM | POA: Diagnosis not present

## 2023-11-15 DIAGNOSIS — C61 Malignant neoplasm of prostate: Secondary | ICD-10-CM | POA: Insufficient documentation

## 2023-11-15 DIAGNOSIS — N4289 Other specified disorders of prostate: Secondary | ICD-10-CM | POA: Diagnosis not present

## 2023-11-15 DIAGNOSIS — Z87891 Personal history of nicotine dependence: Secondary | ICD-10-CM | POA: Diagnosis not present

## 2023-11-15 DIAGNOSIS — E0590 Thyrotoxicosis, unspecified without thyrotoxic crisis or storm: Secondary | ICD-10-CM | POA: Insufficient documentation

## 2023-11-15 DIAGNOSIS — Z923 Personal history of irradiation: Secondary | ICD-10-CM | POA: Insufficient documentation

## 2023-11-15 DIAGNOSIS — K219 Gastro-esophageal reflux disease without esophagitis: Secondary | ICD-10-CM | POA: Insufficient documentation

## 2023-11-15 DIAGNOSIS — G4730 Sleep apnea, unspecified: Secondary | ICD-10-CM | POA: Diagnosis not present

## 2023-11-15 HISTORY — PX: PROSTATE BIOPSY: SHX241

## 2023-11-15 HISTORY — PX: TRANSRECTAL ULTRASOUND: SHX5146

## 2023-11-15 LAB — GLUCOSE, CAPILLARY: Glucose-Capillary: 101 mg/dL — ABNORMAL HIGH (ref 70–99)

## 2023-11-15 SURGERY — BIOPSY, PROSTATE
Anesthesia: General | Site: Rectum

## 2023-11-15 MED ORDER — TRAMADOL HCL 50 MG PO TABS: 50.0000 mg | ORAL_TABLET | Freq: Four times a day (QID) | ORAL | 0 refills | Status: DC | PRN

## 2023-11-15 MED ORDER — OXYCODONE HCL 5 MG/5ML PO SOLN: 5.0000 mg | Freq: Once | ORAL | Status: AC | PRN

## 2023-11-15 MED ORDER — PROPOFOL 10 MG/ML IV BOLUS
INTRAVENOUS | Status: DC | PRN
  Administered 2023-11-15: 80 mg via INTRAVENOUS
  Administered 2023-11-15: 150 ug/kg/min via INTRAVENOUS

## 2023-11-15 MED ORDER — LACTATED RINGERS IV SOLN: INTRAVENOUS | Status: DC | PRN

## 2023-11-15 MED ORDER — LIDOCAINE 2% (20 MG/ML) 5 ML SYRINGE
INTRAMUSCULAR | Status: AC
  Filled 2023-11-15: qty 5

## 2023-11-15 MED ORDER — OXYCODONE HCL 5 MG PO TABS
5.0000 mg | ORAL_TABLET | Freq: Once | ORAL | Status: AC | PRN
  Administered 2023-11-15: 5 mg via ORAL
  Filled 2023-11-15: qty 1

## 2023-11-15 MED ORDER — LIDOCAINE HCL (PF) 2 % IJ SOLN: 10.0000 mL | Freq: Once | INTRAMUSCULAR | Status: DC

## 2023-11-15 MED ORDER — FENTANYL CITRATE PF 50 MCG/ML IJ SOSY
25.0000 ug | PREFILLED_SYRINGE | INTRAMUSCULAR | Status: DC | PRN
  Administered 2023-11-15: 50 ug via INTRAVENOUS

## 2023-11-15 MED ORDER — LIDOCAINE 2% (20 MG/ML) 5 ML SYRINGE
INTRAMUSCULAR | Status: DC | PRN
  Administered 2023-11-15: 40 mg via INTRAVENOUS

## 2023-11-15 MED ORDER — CHLORHEXIDINE GLUCONATE 0.12 % MT SOLN: 15.0000 mL | Freq: Once | OROMUCOSAL | Status: AC

## 2023-11-15 MED ORDER — CHLORHEXIDINE GLUCONATE 0.12 % MT SOLN
OROMUCOSAL | Status: AC
  Administered 2023-11-15: 15 mL via OROMUCOSAL
  Filled 2023-11-15: qty 15

## 2023-11-15 MED ORDER — ORAL CARE MOUTH RINSE: 15.0000 mL | Freq: Once | OROMUCOSAL | Status: AC

## 2023-11-15 MED ORDER — FENTANYL CITRATE PF 50 MCG/ML IJ SOSY
PREFILLED_SYRINGE | INTRAMUSCULAR | Status: AC
  Filled 2023-11-15: qty 1

## 2023-11-15 MED ORDER — ONDANSETRON HCL 4 MG/2ML IJ SOLN: 4.0000 mg | Freq: Once | INTRAMUSCULAR | Status: DC | PRN

## 2023-11-15 MED ORDER — GENTAMICIN SULFATE 40 MG/ML IJ SOLN: 80.0000 mg | Freq: Once | INTRAMUSCULAR | Status: DC

## 2023-11-15 MED ORDER — LACTATED RINGERS IV SOLN: INTRAVENOUS | Status: DC

## 2023-11-15 SURGICAL SUPPLY — 5 items
GLOVE BIOGEL PI IND STRL 7.0 (GLOVE) ×4 IMPLANT
KIT TURNOVER CYSTO (KITS) ×2 IMPLANT
POSITIONER HEAD 8X9X4 ADT (SOFTGOODS) ×2 IMPLANT
SURGILUBE 2OZ TUBE FLIPTOP (MISCELLANEOUS) ×2 IMPLANT
TOWEL OR 17X26 4PK STRL BLUE (TOWEL DISPOSABLE) ×2 IMPLANT

## 2023-11-15 NOTE — Discharge Instructions (Signed)

## 2023-11-15 NOTE — Progress Notes (Signed)
 Dr Claretta Croft called by RN to get information on when to start Eliquis  back.  Patient was told to restart on Monday.

## 2023-11-15 NOTE — Transfer of Care (Signed)
 Immediate Anesthesia Transfer of Care Note  Patient: ASHAUN GAUGHAN  Procedure(s) Performed: BIOPSY, PROSTATE (Prostate) ULTRASOUND, RECTAL APPROACH (Rectum) BIOPSY, PROSTATE, RECTAL APPROACH, WITH US  GUIDANCE (Prostate)  Patient Location: PACU  Anesthesia Type:General  Level of Consciousness: drowsy  Airway & Oxygen  Therapy: Patient Spontanous Breathing and Patient connected to face mask oxygen   Post-op Assessment: Report given to RN and Post -op Vital signs reviewed and stable  Post vital signs: Reviewed and stable  Last Vitals:  Vitals Value Taken Time  BP 157/89 11/15/23 1432  Temp 36.3 C 11/15/23 1432  Pulse 57 11/15/23 1432  Resp 15 11/15/23 1432  SpO2 95 % 11/15/23 1432    Last Pain:  Vitals:   11/15/23 1439  TempSrc:   PainSc: 0-No pain      Patients Stated Pain Goal: 4 (11/15/23 1425)  Complications: No notable events documented.

## 2023-11-15 NOTE — H&P (Signed)
 HPI: [name] is a 73yo here for followup for prostate cancer. Prostate MRI shows a 0.41cc PIRADS 4 lesion right posterior lateral peripheral zone. He is a hx of IMRT for prostate cancer. No worsening LUTS.      PMH:     Past Medical History:  Diagnosis Date   Anxiety     Arthritis     Atrial fibrillation (HCC)     Atrial flutter (HCC)     BPH (benign prostatic hyperplasia)     Chest pain     Dizziness     Dyspnea      laying down occ   Fracture 08/17/2015    MULTIPLE RIB FRACTURES     FROM FALL    GERD (gastroesophageal reflux disease)     Hemorrhoids     History of kidney stones      noted on CT scan   History of radiation therapy 08/22/11-10/13/11    prostate   Hyperlipemia     Hypertension     Hyperthyroidism     IBS (irritable bowel syndrome)     Insomnia     Light headedness     Migraine     Numbness and tingling in left arm     Numbness and tingling of both legs     Open fracture of left elbow 08/18/2015   Prostate cancer [facility])      prostate s/p radiation Mar 2013   Rib fractures 08/17/2015   Tinnitus            Surgical History:      Past Surgical History:  Procedure Laterality Date   BOTOX  INJECTION N/A 01/14/2020    Procedure: INJECTION OF BOTOX  INTO ANAL SPHINCTER;  Surgeon: Melvenia Stabs, MD;  Location: [facility] ORS;  Service: General;  Laterality: N/A;   COLONOSCOPY N/A 12/19/2012    WUJ:[id number] 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 Normal appearing colon   COLONOSCOPY WITH PROPOFOL  N/A 10/23/2019    Procedure: COLONOSCOPY WITH PROPOFOL ;  Surgeon: Suzette Espy, MD; external and grade 2 internal hemorrhoids, abnormal rectal blood vessels consistent with radiation proctitis s/p APC therapy, otherwise normal exam.   EVALUATION UNDER ANESTHESIA WITH ANAL FISTULECTOMY N/A 01/14/2020    Procedure: ANORECTAL EXAM UNDER ANESTHESIA;  Surgeon: Melvenia Stabs, MD;  Location: [facility];  Service:  General;  Laterality: N/A;   HOT HEMOSTASIS   10/23/2019    Procedure: HOT HEMOSTASIS (ARGON PLASMA COAGULATION/BICAP);  Surgeon: Suzette Espy, MD;  Location: [facility];  Service: Endoscopy;;  apc rectal proctitis      KIDNEY SURGERY   1982    kidney tube collapse repair   RADIOACTIVE SEED IMPLANT        Prostate   SVT ABLATION N/A 12/12/2022    Procedure: SVT ABLATION;  Surgeon: Tammie Fall, MD;  Location: [facility] INVASIVE CV LAB;  Service: Cardiovascular;  Laterality: N/A;          Home Medications:  Allergies as of 09/14/2023         Reactions    Latex Rash, Other (See Comments)    Possible reaction to latex gloves per patient    Meclizine Other (See Comments)    Other reaction(s): Abdominal Pain, Other            Medication List           Accurate as of September 14, 2023 10:08 AM. If you have any questions, ask  your nurse or doctor.              alfuzosin  10 MG 24 hr tablet Commonly known as: UROXATRAL  Take 1 tablet (10 mg total) by mouth daily with breakfast.    Norvasc  5 MG tablet Generic drug: amLODipine  Take 5 mg by mouth daily.    amLODipine  10 MG tablet Commonly known as: NORVASC  Take 1 tablet (10 mg total) by mouth daily.    apixaban  5 MG Tabs tablet Commonly known as: Eliquis  Take 1 tablet (5 mg total) by mouth 2 (two) times daily.    Azelastine -Fluticasone  137-50 MCG/ACT Susp Place 1 spray into the nose every 12 (twelve) hours.    cloNIDine  0.1 MG tablet Commonly known as: CATAPRES  Take 1 tablet (0.1 mg total) by mouth 2 (two) times daily as needed (SBP>160).    diazepam  2 MG tablet Commonly known as: Valium  Take 1 tablet by mouth prior to MRI.    DULoxetine  30 MG capsule Commonly known as: Cymbalta  Take 1 capsule (30 mg total) by mouth daily.    DULoxetine  60 MG capsule Commonly known as: Cymbalta  Take 1 capsule (60 mg total) by mouth daily.    Emgality  120 MG/ML Soaj Generic drug: Galcanezumab -gnlm Inject 1 Pen into the skin  every 30 (thirty) days.    ezetimibe  10 MG tablet Commonly known as: Zetia  Take 1 tablet (10 mg total) by mouth daily.    furosemide  20 MG tablet Commonly known as: LASIX  Take 1 tablet (20 mg total) by mouth daily. Take with or after meal.    hydrOXYzine  10 MG tablet Commonly known as: ATARAX  Take 1 tablet (10 mg total) by mouth 3 (three) times daily as needed for anxiety.    hyoscyamine  0.375 MG 12 hr tablet Commonly known as: LEVBID Take 0.375 mg by mouth 2 (two) times daily.    loratadine  10 MG tablet Commonly known as: CLARITIN  Take 10 mg by mouth daily.    loratadine  10 MG tablet Commonly known as: CLARITIN  Take 1 tablet (10 mg total) by mouth daily.    losartan  100 MG tablet Commonly known as: COZAAR  Take 100 mg by mouth daily.    losartan  100 MG tablet Commonly known as: COZAAR  Take 50 mg by mouth daily.    methimazole  5 MG tablet Commonly known as: TAPAZOLE  Take 0.5 tablets (2.5 mg total) by mouth daily.    metoprolol  tartrate 50 MG tablet Commonly known as: LOPRESSOR  Take 50 mg by mouth 2 (two) times daily.    metoprolol  tartrate 25 MG tablet Commonly known as: LOPRESSOR  Take 1 tablet (25 mg total) by mouth 2 (two) times daily. TAKE EXTRA HALF TABLET  AS NEEDED    mirtazapine  15 MG tablet Commonly known as: REMERON  Take 1 tablet (15 mg total) by mouth at bedtime.    mirtazapine  7.5 MG tablet Commonly known as: REMERON  TAKE 1 TABLET(7.5 MG) BY MOUTH AT BEDTIME    Nurtec 75 MG Tbdp Generic drug: Rimegepant Sulfate Take 75 mg by mouth daily as needed (pain).    omeprazole  20 MG capsule Commonly known as: PRILOSEC Take 20 mg by mouth daily.    ondansetron  4 MG tablet Commonly known as: Zofran  Take 1 tablet (4 mg total) by mouth every 8 (eight) hours as needed for nausea or vomiting.    MiraLax  17 g packet Generic drug: polyethylene glycol Take 17 g by mouth once.    polyethylene glycol 17 g packet Commonly known as: MIRALAX  / GLYCOLAX  Take  17 g by  mouth daily.    potassium chloride  20 MEQ packet Commonly known as: KLOR-CON  Take 20 mEq by mouth once.    potassium chloride  SA 20 MEQ tablet Commonly known as: KLOR-CON  M Take 1 tablet (20 mEq total) by mouth daily.    pregabalin  150 MG capsule Commonly known as: LYRICA  Take 1 capsule (150 mg total) by mouth 2 (two) times daily.    PROBIOTIC-10 PO Take 1 capsule by mouth daily.    saccharomyces boulardii 250 MG capsule Commonly known as: FLORASTOR Take 250 mg by mouth 2 (two) times daily.    tiZANidine  4 MG tablet Commonly known as: Zanaflex  Take 1 tablet (4 mg total) by mouth every 6 (six) hours as needed for muscle spasms.    triamcinolone  cream 0.1 % Commonly known as: KENALOG  Apply 1 Application topically 2 (two) times daily.    Tylenol  325 MG tablet Generic drug: acetaminophen  Take 325 mg by mouth every 6 (six) hours as needed.    Ubrelvy  100 MG Tabs Generic drug: Ubrogepant  Take 1 tablet (100 mg total) by mouth as needed (for headaches). May repeat a dose in 2 hours if needed. Max dose 2 pills in 24 hours    vitamin B-12 100 MCG tablet Commonly known as: CYANOCOBALAMIN  Take 100 mcg by mouth daily.    Vitamin D3 125 MCG (5000 UT) Caps Take 1 capsule by mouth daily.             Allergies:  Allergies       Allergies  Allergen Reactions   Latex Rash and Other (See Comments)      Possible reaction to latex gloves per patient   Meclizine Other (See Comments)      Other reaction(s): Abdominal Pain, Other        Family History:      Family History  Problem Relation Age of Onset   Aneurysm Mother          aortic   Hypertension Mother     Headache Mother     Prostate cancer Father     Hypertension Father     Heart failure Brother     Colon cancer Neg Hx     Colon polyps Neg Hx            Social History:  reports that he quit smoking about 5 years ago. His smoking use included cigarettes. He started smoking about 25 years ago. He has a  20 pack-year smoking history. He has been exposed to tobacco smoke. He has never used smokeless tobacco. He reports that he does not currently use alcohol. He reports that he does not use drugs.   ROS: All other review of systems were reviewed and are negative except what is noted above in HPI   Physical Exam: BP (!) 171/64   Pulse 74   Constitutional:  Alert and oriented, No acute distress. HEENT: [place] AT, moist mucus membranes.  Trachea midline, no masses. Cardiovascular: No clubbing, cyanosis, or edema. Respiratory: Normal respiratory effort, no increased work of breathing. GI: Abdomen is soft, nontender, nondistended, no abdominal masses GU: No CVA tenderness.  Lymph: No cervical or inguinal lymphadenopathy. Skin: No rashes, bruises or suspicious lesions. Neurologic: Grossly intact, no focal deficits, moving all 4 extremities. Psychiatric: Normal mood and affect.   Laboratory Data: Recent Labs       Lab Results  Component Value Date    WBC 4.3 06/04/2023    HGB 14.2 06/04/2023    HCT 43.1 06/04/2023  MCV 88 06/04/2023    PLT 213 06/04/2023        Recent Labs       Lab Results  Component Value Date    CREATININE 0.88 04/19/2023        Recent Labs       Lab Results  Component Value Date    PSA 1.8 12/10/2019    PSA 2.3 11/10/2019        Recent Labs  No results found for: "TESTOSTERONE "     Recent Labs       Lab Results  Component Value Date    HGBA1C 5.7 (H) 01/29/2023        Urinalysis Labs (Brief)          Component Value Date/Time    COLORURINE STRAW (A) 01/07/2023 1953    APPEARANCEUR Clear 04/11/2023 1119    LABSPEC 1.008 01/07/2023 1953    PHURINE 7.0 01/07/2023 1953    GLUCOSEU Negative 04/11/2023 1119    HGBUR NEGATIVE 01/07/2023 1953    BILIRUBINUR Negative 04/11/2023 1119    KETONESUR NEGATIVE 01/07/2023 1953    PROTEINUR Negative 04/11/2023 1119    PROTEINUR NEGATIVE 01/07/2023 1953    UROBILINOGEN negative (A) 12/12/2019 1113     UROBILINOGEN 1.0 08/03/2012 1156    NITRITE Negative 04/11/2023 1119    NITRITE NEGATIVE 01/07/2023 1953    LEUKOCYTESUR Negative 04/11/2023 1119    LEUKOCYTESUR NEGATIVE 01/07/2023 1953        Recent Labs       Lab Results  Component Value Date    LABMICR Comment 04/11/2023    WBCUA None seen 03/02/2020    LABEPIT None seen 03/02/2020    BACTERIA None seen 03/02/2020        Pertinent Imaging: MRI prostate : Images reviewed and discussed with the patient  Results for orders placed during the hospital encounter of 11/07/19   DG Abdomen 1 View   Narrative CLINICAL DATA:  Constipation. Hemorrhoids. Irritable bowel syndrome. Prostate cancer.   EXAM: ABDOMEN - 1 VIEW   COMPARISON:  CT pelvis 09/25/2019 in CT abdomen 04/29/2019   FINDINGS: Oval-shaped densities along the midline of the upper abdomen, possibly in the stomach. The patient had gallstones on the 04/29/2019 exam but these densities appear more regular and more medially located than expected location for gallstones. There is another oval-shaped density projecting over the transverse colon and 1 along the rectum, again I suspect that these are within stomach/bowel.   The tiny punctate right kidney lower pole renal calculus shown on 04/29/2019 is not readily seen on today's conventional radiographs.   Dextroconvex lumbar scoliosis with rotary component. No dilated bowel. Bowel gas pattern appears otherwise unremarkable.   IMPRESSION: 1. Oval-shaped densities in the abdomen are thought to be in the stomach and bowel. 2. No dilated bowel.  Unremarkable bowel gas pattern. 3. Dextroconvex lumbar scoliosis with rotary component.     Electronically Signed By: Freida Jes M.D. On: 11/07/2019 17:35   No results found for this or any previous visit.   No results found for this or any previous visit.   No results found for this or any previous visit.   No results found for this or any previous  visit.   No results found for this or any previous visit.   No results found for this or any previous visit.   No results found for this or any previous visit.     Assessment & Plan:     1.  Rising PSA after treatment for prostate cancer We will schedule for saturation biopsy. Risks/benefits/alternatives discussed - Urinalysis, Routine w reflex microscopic

## 2023-11-15 NOTE — Op Note (Signed)
 Pre op diagnosis: Elevated PSA  Post op diagnosis: Elevated PSA        Procedure: Transrectal ultrasound of the prostate, Ultrasound Guided Prostate needle biopsy.   Attending: Johnie Nailer  Anesthesia: General  EBL: minimal  Antibiotics: Gentamicin   Drains: none  Findings: 15.5g prostate with prominent right lobe. Prostate measurements: width 2.4cm, height 1.9cm, length 2.1cm ho hypoechoic or hyperechoic lesions   Indications: Pt is a 73yo male with a history of rising PSA after IMRT for prostate cancer. After discussing workup he has elected to proceed with prostate biopsy  Procedure in detail: Prior to procedure consent was obtained. The patient was brought to the OR and a brief timeout was done to ensure correct patient, correct procedure, and correct site. General anesthesia was administered and patient was placed in the dorsal lithotomy position. The prostate size was estimated to be 20g by digital rectal exam. The 10 MHz transrectal ultrasound probe was placed into the rectum. Prostate width measured 2.4cm, height of 1.9cm and length of 2.1cm . Prostate volume was measured to be 15.5 cc. The biopsy cores were obtained using direct, real-time ultrasound guidance utilizing a standard 14core pattern with one core from the right apex lateral using real time ultrasound guidance to direct the biopsy core being taken from this location, right apex medial using real time ultrasound guidance to direct the biopsy core being taken from this location, left apex lateral using real time ultrasound guidance to direct the biopsy core being taken from this location, left apex medial using real time ultrasound guidance to direct the biopsy core being taken from this location, 2 right mid lateral using real time ultrasound guidance to direct the biopsy core being taken from this location, 2 right mid medial using real time ultrasound guidance to direct the biopsy core being taken from this location, left  mid lateral using real time ultrasound guidance to direct the biopsy core being taken from this location, left mid medial using real time ultrasound guidance to direct the biopsy core being taken from this location, right base lateral using real time ultrasound guidance to direct the biopsy core being taken from this location, right base medial using real time ultrasound guidance to direct the biopsy core being taken from this location, left base lateral using real time ultrasound guidance to direct the biopsy core being taken from this location and left base medial of the prostate using real time ultrasound guidance to direct the biopsy core being taken from this location. A separate biopsy was taken from the right seminal vesical. The biopsy cores were placed in buffered formalin and sent to pathology. This then concluded the procedure which was well tolerated by the patient.   Complications:. None.   Condition: Stable, extubated, transferred to PACU  Plan: Patient is to be discharged home. They are to followup in 1 week for pathology discussion

## 2023-11-15 NOTE — Anesthesia Preprocedure Evaluation (Signed)
 Anesthesia Evaluation  Patient identified by MRN, date of birth, ID band Patient awake    Reviewed: Allergy & Precautions, H&P , NPO status , Patient's Chart, lab work & pertinent test results, reviewed documented beta blocker date and time   Airway Mallampati: II  TM Distance: >3 FB Neck ROM: full    Dental no notable dental hx.    Pulmonary neg pulmonary ROS, shortness of breath, sleep apnea , former smoker   Pulmonary exam normal breath sounds clear to auscultation       Cardiovascular Exercise Tolerance: Good hypertension, negative cardio ROS + dysrhythmias  Rhythm:regular Rate:Normal     Neuro/Psych  Headaches PSYCHIATRIC DISORDERS Anxiety Depression     Neuromuscular disease negative neurological ROS  negative psych ROS   GI/Hepatic negative GI ROS, Neg liver ROS,GERD  ,,  Endo/Other  negative endocrine ROS Hyperthyroidism   Renal/GU negative Renal ROS  negative genitourinary   Musculoskeletal   Abdominal   Peds  Hematology negative hematology ROS (+)   Anesthesia Other Findings   Reproductive/Obstetrics negative OB ROS                             Anesthesia Physical Anesthesia Plan  ASA: 3  Anesthesia Plan: General   Post-op Pain Management:    Induction:   PONV Risk Score and Plan: Propofol  infusion  Airway Management Planned:   Additional Equipment:   Intra-op Plan:   Post-operative Plan:   Informed Consent: I have reviewed the patients History and Physical, chart, labs and discussed the procedure including the risks, benefits and alternatives for the proposed anesthesia with the patient or authorized representative who has indicated his/her understanding and acceptance.     Dental Advisory Given  Plan Discussed with: CRNA  Anesthesia Plan Comments:        Anesthesia Quick Evaluation

## 2023-11-15 NOTE — Progress Notes (Signed)
 Dr Claretta Croft called by RN to ask about patient starting back on his Eliquis , should start back on Monday per MD.  This was instructed to patient.

## 2023-11-16 LAB — SURGICAL PATHOLOGY

## 2023-11-16 NOTE — Anesthesia Postprocedure Evaluation (Signed)
 Anesthesia Post Note  Patient: Steven [name]  Procedure(s) Performed: BIOPSY, PROSTATE (Prostate) ULTRASOUND, RECTAL APPROACH (Rectum) BIOPSY, PROSTATE, RECTAL APPROACH, WITH US  GUIDANCE (Prostate)  Patient location during evaluation: Phase II Anesthesia Type: General Level of consciousness: awake Pain management: pain level controlled Vital Signs Assessment: post-procedure vital signs reviewed and stable Respiratory status: spontaneous breathing and respiratory function stable Cardiovascular status: blood pressure returned to baseline and stable Postop Assessment: no headache and no apparent nausea or vomiting Anesthetic complications: no Comments: Late entry   No notable events documented.   Last Vitals:  Vitals:   11/15/23 1425 11/15/23 1432  BP: 130/83 (!) 157/89  Pulse: (!) 57 (!) 57  Resp: 12 15  Temp: (!) 36.1 C (!) 36.3 C  SpO2: 99% 95%    Last Pain:  Vitals:   11/16/23 1409  TempSrc:   PainSc: 3                  Coretha Dew

## 2023-11-19 NOTE — Telephone Encounter (Signed)
 Patient did not call back, but had his surgery last week. Will close note.

## 2023-11-20 ENCOUNTER — Telehealth: Payer: Self-pay | Admitting: Urology

## 2023-11-20 NOTE — Telephone Encounter (Signed)
 FYI and advise

## 2023-11-20 NOTE — Telephone Encounter (Signed)
 Had biopsy and wants to know if he can get on his riding mower

## 2023-11-21 NOTE — Telephone Encounter (Signed)
 Called pt to let him know that per MD [name] "okay to get in his riding lawn mower"

## 2023-11-24 ENCOUNTER — Other Ambulatory Visit: Payer: Self-pay | Admitting: Internal Medicine

## 2023-11-26 DIAGNOSIS — L710 Perioral dermatitis: Secondary | ICD-10-CM | POA: Diagnosis not present

## 2023-11-26 NOTE — Telephone Encounter (Signed)
 Prescription refill request for Eliquis  received. Indication: PAF Last office visit: 05/03/23  Alesia Husky MD Scr: 0.90 on 10/09/23  Epic Age: 73 Weight: 90.3kg  Based on above findings Eliquis  5mg  twice daily is the appropriate dose.  Refill approved.

## 2023-11-30 ENCOUNTER — Ambulatory Visit (INDEPENDENT_AMBULATORY_CARE_PROVIDER_SITE_OTHER): Payer: Self-pay | Admitting: Internal Medicine

## 2023-11-30 ENCOUNTER — Telehealth: Payer: Self-pay | Admitting: Pharmacy Technician

## 2023-11-30 ENCOUNTER — Encounter: Payer: Self-pay | Admitting: Internal Medicine

## 2023-11-30 ENCOUNTER — Other Ambulatory Visit (HOSPITAL_COMMUNITY): Payer: Self-pay

## 2023-11-30 VITALS — BP 143/74 | HR 59 | Resp 16 | Ht 67.0 in | Wt 234.0 lb

## 2023-11-30 DIAGNOSIS — J209 Acute bronchitis, unspecified: Secondary | ICD-10-CM | POA: Diagnosis not present

## 2023-11-30 DIAGNOSIS — J329 Chronic sinusitis, unspecified: Secondary | ICD-10-CM | POA: Insufficient documentation

## 2023-11-30 DIAGNOSIS — J42 Unspecified chronic bronchitis: Secondary | ICD-10-CM | POA: Insufficient documentation

## 2023-11-30 MED ORDER — GUAIFENESIN-CODEINE 100-10 MG/5ML PO SOLN: 5.0000 mL | Freq: Three times a day (TID) | ORAL | 0 refills | Status: DC | PRN

## 2023-11-30 MED ORDER — AZITHROMYCIN 250 MG PO TABS: ORAL_TABLET | ORAL | 0 refills | Status: AC

## 2023-11-30 MED ORDER — METHYLPREDNISOLONE 4 MG PO TBPK: ORAL_TABLET | ORAL | 0 refills | Status: DC

## 2023-11-30 NOTE — Progress Notes (Signed)
 Acute Office Visit  Subjective:    Patient ID: Steven [name], male    DOB: Feb 22, 1951, 73 y.o.   MRN: [id number]  Chief Complaint  Patient presents with   Wheezing    Has been having some wheezing  going on for about a month or more. Some congestion in chest, coughs up whitish mucus. Gets in his throat at night and chokes him and causes him to wake up     HPI Patient is in today for complaint of cough with clear expectoration and wheezing for the last 1 month.  He has chronic nasal congestion and uses azelastine  nasal spray for it.  He has felt choking sensation due to mucus sensation in the throat.  Denies any fever or chills.  He used to smoke cigarettes till 2020, has about 20-pack-year history.  He has albuterol  inhaler, but uses it rarely for dyspnea or wheezing.  Past Medical History:  Diagnosis Date   Anxiety    Arthritis    Atrial fibrillation (HCC)    Atrial fibrillation (HCC)    Atrial flutter (HCC)    BPH (benign prostatic hyperplasia)    Chest pain    Dizziness    Dyspnea    laying down occ   Dysrhythmia    Fracture 08/17/2015   MULTIPLE RIB FRACTURES     FROM FALL    GERD (gastroesophageal reflux disease)    Hemorrhoids    History of kidney stones    noted on CT scan   History of radiation therapy 08/22/11-10/13/11   prostate   Hyperlipemia    Hypertension    Hyperthyroidism    IBS (irritable bowel syndrome)    Insomnia    Light headedness    Migraine    Numbness and tingling in left arm    Numbness and tingling of both legs    Open fracture of left elbow 08/18/2015   Pre-diabetes    Prostate cancer [facility])    prostate s/p radiation Mar 2013   Rib fractures 08/17/2015   Tinnitus     Past Surgical History:  Procedure Laterality Date   BOTOX  INJECTION N/A 01/14/2020   Procedure: INJECTION OF BOTOX  INTO ANAL SPHINCTER;  Surgeon: Melvenia Stabs, MD;  Location: [facility];  Service: General;  Laterality: N/A;   COLONOSCOPY N/A 12/19/2012   WUJ:[id number]  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 Normal appearing colon   COLONOSCOPY WITH PROPOFOL  N/A 10/23/2019   Procedure: COLONOSCOPY WITH PROPOFOL ;  Surgeon: Suzette Espy, MD; external and grade 2 internal hemorrhoids, abnormal rectal blood vessels consistent with radiation proctitis s/p APC therapy, otherwise normal exam.   EVALUATION UNDER ANESTHESIA WITH ANAL FISTULECTOMY N/A 01/14/2020   Procedure: ANORECTAL EXAM UNDER ANESTHESIA;  Surgeon: Melvenia Stabs, MD;  Location: [facility];  Service: General;  Laterality: N/A;   HOT HEMOSTASIS  10/23/2019   Procedure: HOT HEMOSTASIS (ARGON PLASMA COAGULATION/BICAP);  Surgeon: Suzette Espy, MD;  Location: [facility];  Service: Endoscopy;;  apc rectal proctitis     KIDNEY SURGERY  1982   kidney tube collapse repair   PROSTATE BIOPSY N/A 11/15/2023   Procedure: BIOPSY, PROSTATE;  Surgeon: Marco Severs, MD;  Location: [facility];  Service: Urology;  Laterality: N/A;   PROSTATE BIOPSY N/A 11/15/2023   Procedure: BIOPSY, PROSTATE, RECTAL APPROACH, WITH US  GUIDANCE;  Surgeon: Marco Severs, MD;  Location: [facility];  Service: Urology;  Laterality: N/A;   RADIOACTIVE SEED  IMPLANT     Prostate   SVT ABLATION N/A 12/12/2022   Procedure: SVT ABLATION;  Surgeon: Tammie Fall, MD;  Location: [facility] INVASIVE CV LAB;  Service: Cardiovascular;  Laterality: N/A;   TRANSRECTAL ULTRASOUND N/A 11/15/2023   Procedure: ULTRASOUND, RECTAL APPROACH;  Surgeon: Marco Severs, MD;  Location: [facility];  Service: Urology;  Laterality: N/A;    Family History  Problem Relation Age of Onset   Aneurysm Mother        aortic   Hypertension Mother    Headache Mother    Prostate cancer Father    Hypertension Father    Heart failure Brother    Colon cancer Neg Hx    Colon polyps Neg Hx     Social History   Socioeconomic History   Marital status: Single    Spouse name: Not on file   Number of  children: 2   Years of education: 12   Highest education level: 12th grade  Occupational History   Occupation: Custodian     Employer: [employer]  Tobacco Use   Smoking status: Former    Current packs/day: 0.00    Average packs/day: 1 pack/day for 20.0 years (20.0 ttl pk-yrs)    Types: Cigarettes    Start date: 2000    Quit date: 2020    Years since quitting: 5.3    Passive exposure: Past   Smokeless tobacco: Never   Tobacco comments:    Quit smoking x 2 years    07/2015   SOMETIIMES i USE VAPOR     Smoking Cessation Classes, Agencies, Aeronautical engineer Use   Vaping status: Never Used  Substance and Sexual Activity   Alcohol use: Not Currently   Drug use: Never   Sexual activity: Not Currently    Partners: Female  Other Topics Concern   Not on file  Social History Narrative   Lives with friend   Divorced   No caffeine   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   Social Drivers of Corporate investment banker Strain: Low Risk  (06/25/2023)   Overall Financial Resource Strain (CARDIA)    Difficulty of Paying Living Expenses: Not hard at all  Recent Concern: Financial Resource Strain - High Risk (05/08/2023)   Overall Financial Resource Strain (CARDIA)    Difficulty of Paying Living Expenses: Hard  Food Insecurity: No Food Insecurity (06/25/2023)   Hunger Vital Sign    Worried About Running Out of Food in the Last Year: Never true    Ran Out of Food in the Last Year: Never true  Transportation Needs: No Transportation Needs (06/25/2023)   PRAPARE - Administrator, Civil Service (Medical): No    Lack of Transportation (Non-Medical): No  Physical Activity: Sufficiently Active (06/25/2023)   Exercise Vital Sign    Days of Exercise per Week: 7 days    Minutes of Exercise per Session: 50 min  Stress: Stress Concern Present (06/25/2023)   [employer] of Occupational Health - Occupational Stress Questionnaire     Feeling of Stress : To some extent  Social Connections: Moderately Integrated (06/25/2023)   Social Connection and Isolation Panel [NHANES]    Frequency of Communication with Friends and Family: More than three times a week    Frequency of Social Gatherings with Friends and Family: More than three times a week    Attends Religious Services: More than 4 times per year  Active Member of Clubs or Organizations: No    Attends Banker Meetings: Never    Marital Status: Living with partner  Intimate Partner Violence: Not At Risk (06/25/2023)   Humiliation, Afraid, Rape, and Kick questionnaire    Fear of Current or Ex-Partner: No    Emotionally Abused: No    Physically Abused: No    Sexually Abused: No    Outpatient Medications Prior to Visit  Medication Sig Dispense Refill   acetaminophen  (TYLENOL ) 500 MG tablet Take 1,000 mg by mouth every 8 (eight) hours as needed for moderate pain (pain score 4-6).     albuterol  (VENTOLIN  HFA) 108 (90 Base) MCG/ACT inhaler Inhale 2 puffs into the lungs every 4 (four) hours as needed. (Patient taking differently: Inhale 2 puffs into the lungs every 4 (four) hours as needed (Congestion/allergies).) 18 g 0   alfuzosin  (UROXATRAL ) 10 MG 24 hr tablet Take 1 tablet (10 mg total) by mouth daily with breakfast. 90 tablet 3   azelastine  (ASTELIN ) 0.1 % nasal spray Place 1 spray into both nostrils 2 (two) times daily. Use in each nostril as directed (Patient taking differently: Place 1 spray into both nostrils 2 (two) times daily as needed (Congestion). Use in each nostril as directed) 30 mL 0   Azelastine -Fluticasone  137-50 MCG/ACT SUSP Place 1 spray into the nose every 12 (twelve) hours. 23 g 1   benzonatate  (TESSALON ) 200 MG capsule Take 1 capsule (200 mg total) by mouth 3 (three) times daily as needed for cough. 20 capsule 0   Cholecalciferol (VITAMIN D3) 125 MCG (5000 UT) CAPS Take 5,000 Units by mouth daily.     cloNIDine  (CATAPRES ) 0.1 MG tablet Take  1 tablet (0.1 mg total) by mouth 2 (two) times daily as needed (SBP>160). 60 tablet 11   DULoxetine  (CYMBALTA ) 60 MG capsule Take 1 capsule (60 mg total) by mouth daily. 90 capsule 3   ELIQUIS  5 MG TABS tablet TAKE 1 TABLET(5 MG) BY MOUTH TWICE DAILY 60 tablet 5   ezetimibe  (ZETIA ) 10 MG tablet Take 1 tablet (10 mg total) by mouth daily. 90 tablet 1   furosemide  (LASIX ) 20 MG tablet Take 1 tablet (20 mg total) by mouth daily. Take with or after meal. 90 tablet 1   Galcanezumab -gnlm (EMGALITY ) 120 MG/ML SOAJ Inject 1 Pen into the skin every 30 (thirty) days. 1.12 mL 7   hydrOXYzine  (ATARAX ) 10 MG tablet Take 1 tablet (10 mg total) by mouth 3 (three) times daily as needed for anxiety. 30 tablet 0   hyoscyamine  (LEVBID) 0.375 MG 12 hr tablet Take 0.375 mg by mouth every 12 (twelve) hours as needed for cramping.     loratadine  (CLARITIN ) 10 MG tablet Take 1 tablet (10 mg total) by mouth daily. (Patient taking differently: Take 10 mg by mouth daily as needed for allergies.) 30 tablet 11   losartan  (COZAAR ) 100 MG tablet Take 100 mg by mouth daily.     methimazole  (TAPAZOLE ) 5 MG tablet Take 0.5 tablets (2.5 mg total) by mouth daily. 90 tablet 0   mirtazapine  (REMERON ) 15 MG tablet TAKE 1 TABLET(15 MG) BY MOUTH AT BEDTIME 30 tablet 2   NURTEC 75 MG TBDP Take 75 mg by mouth daily as needed (migraine).     omeprazole  (PRILOSEC) 20 MG capsule Take 20 mg by mouth daily.     ondansetron  (ZOFRAN ) 4 MG tablet Take 1 tablet (4 mg total) by mouth every 8 (eight) hours as needed for nausea or vomiting. 20  tablet 1   polyethylene glycol (MIRALAX  / GLYCOLAX ) 17 g packet Take 17 g by mouth daily. 30 each 2   potassium chloride  SA (KLOR-CON  M) 20 MEQ tablet Take 1 tablet (20 mEq total) by mouth daily. (Patient taking differently: Take 40 mEq by mouth daily.) 30 tablet 0   pregabalin  (LYRICA ) 150 MG capsule Take 1 capsule (150 mg total) by mouth 2 (two) times daily. 60 capsule 2   tiZANidine  (ZANAFLEX ) 4 MG tablet Take  1 tablet (4 mg total) by mouth every 6 (six) hours as needed for muscle spasms. 30 tablet 0   traMADol  (ULTRAM ) 50 MG tablet Take 1 tablet (50 mg total) by mouth every 6 (six) hours as needed. 15 tablet 0   triamcinolone  cream (KENALOG ) 0.1 % Apply 1 Application topically 2 (two) times daily. 30 g 0   Ubrogepant  (UBRELVY ) 100 MG TABS Take 1 tablet (100 mg total) by mouth as needed (for headaches). May repeat a dose in 2 hours if needed. Max dose 2 pills in 24 hours 16 tablet 6   vitamin B-12 (CYANOCOBALAMIN ) 100 MCG tablet Take 100 mcg by mouth daily.     amLODipine  (NORVASC ) 10 MG tablet Take 1 tablet (10 mg total) by mouth daily. 90 tablet 3   metoprolol  tartrate (LOPRESSOR ) 25 MG tablet Take 1 tablet (25 mg total) by mouth 2 (two) times daily. TAKE EXTRA HALF TABLET  AS NEEDED (Patient taking differently: Take 25 mg by mouth 2 (two) times daily.) 180 tablet 2   mirtazapine  (REMERON ) 7.5 MG tablet TAKE 1 TABLET(7.5 MG) BY MOUTH AT BEDTIME 30 tablet 1   No facility-administered medications prior to visit.    Allergies  Allergen Reactions   Latex Rash and Other (See Comments)    Possible reaction to latex gloves per patient   Meclizine Other (See Comments)    Other reaction(s): Abdominal Pain, Other    Review of Systems  Constitutional:  Negative for chills and fever.  HENT:  Positive for congestion and postnasal drip.   Eyes:  Negative for pain and discharge.  Respiratory:  Positive for cough, shortness of breath and wheezing.   Cardiovascular:  Negative for chest pain and palpitations.  Gastrointestinal:  Negative for constipation, diarrhea, nausea and vomiting.  Endocrine: Negative for polydipsia and polyuria.  Genitourinary:  Negative for dysuria and hematuria.  Musculoskeletal:  Negative for neck pain and neck stiffness.  Skin:  Negative for rash.  Neurological:  Negative for dizziness, weakness, numbness and headaches.  Psychiatric/Behavioral:  Negative for agitation and  behavioral problems.        Objective:     Physical Exam Vitals reviewed.  Constitutional:      General: He is not in acute distress.    Appearance: He is not diaphoretic.  HENT:     Head: Normocephalic and atraumatic.     Nose: Congestion present.     Mouth/Throat:     Mouth: Mucous membranes are moist.  Eyes:     General: No scleral icterus.    Extraocular Movements: Extraocular movements intact.  Cardiovascular:     Rate and Rhythm: Normal rate and regular rhythm.     Heart sounds: Normal heart sounds. No murmur heard. Pulmonary:     Breath sounds: Wheezing (Mild, upper lung fields) present. No rales.  Musculoskeletal:     Cervical back: Neck supple. No tenderness.     Right lower leg: No edema.     Left lower leg: No edema.  Skin:  General: Skin is warm.     Findings: No rash.  Neurological:     General: No focal deficit present.     Mental Status: He is alert and oriented to person, place, and time.  Psychiatric:        Mood and Affect: Mood normal.        Behavior: Behavior normal.     BP (!) 143/74   Pulse (!) 59   Resp 16   Ht 5\' 7"  (1.702 m)   Wt 234 lb (106.1 kg)   SpO2 94%   BMI 36.65 kg/m  Wt Readings from Last 3 Encounters:  11/30/23 234 lb (106.1 kg)  11/15/23 220 lb (99.8 kg)  11/12/23 220 lb (99.8 kg)        Assessment & Plan:   Problem List Items Addressed This Visit       Respiratory   Acute bronchitis - Primary   Started empiric azithromycin  Started Medrol dose pack Albuterol  inhaler as needed for dyspnea or wheezing Guaifenesin -codeine syrup as needed for cough If frequent need for albuterol  inhaler, will need maintenance inhaler      Relevant Medications   methylPREDNISolone (MEDROL DOSEPAK) 4 MG TBPK tablet   azithromycin  (ZITHROMAX ) 250 MG tablet   guaiFENesin -codeine 100-10 MG/5ML syrup   Chronic sinusitis   Chronic nasal congestion, cough and choking sensation likely due to postnasal drip Continue azelastine  nasal  spray for now       Relevant Medications   methylPREDNISolone (MEDROL DOSEPAK) 4 MG TBPK tablet   azithromycin  (ZITHROMAX ) 250 MG tablet   guaiFENesin -codeine 100-10 MG/5ML syrup     Meds ordered this encounter  Medications   methylPREDNISolone (MEDROL DOSEPAK) 4 MG TBPK tablet    Sig: Take as package instructions.    Dispense:  1 each    Refill:  0   azithromycin  (ZITHROMAX ) 250 MG tablet    Sig: Take 2 tablets on day 1, then 1 tablet daily on days 2 through 5    Dispense:  6 tablet    Refill:  0   guaiFENesin -codeine 100-10 MG/5ML syrup    Sig: Take 5 mLs by mouth 3 (three) times daily as needed for cough.    Dispense:  120 mL    Refill:  0     Cristyn Crossno Alyssa Backbone, MD

## 2023-11-30 NOTE — Patient Instructions (Addendum)
 Please start taking Azithromycin  as prescribed.  Please start taking Prednisone  as prescribed.  Please take cough syrup as needed for cough, it can cause sleepiness.  Please use Albuterol  inhaler as needed for shortness of breath or wheezing.

## 2023-11-30 NOTE — Telephone Encounter (Signed)
 Pharmacy Patient Advocate Encounter  Received notification from [employer] that Prior Authorization for CODEINE-GUAIFEN 10-100 MG/5ML  has been DENIED.  Full denial letter will be uploaded to the media tab. See denial reason below.

## 2023-11-30 NOTE — Assessment & Plan Note (Addendum)
 Started empiric azithromycin  Started Medrol dose pack Albuterol  inhaler as needed for dyspnea or wheezing Guaifenesin -codeine syrup as needed for cough If frequent need for albuterol  inhaler, will need maintenance inhaler

## 2023-11-30 NOTE — Assessment & Plan Note (Signed)
 Chronic nasal congestion, cough and choking sensation likely due to postnasal drip Continue azelastine  nasal spray for now

## 2023-12-03 ENCOUNTER — Ambulatory Visit (INDEPENDENT_AMBULATORY_CARE_PROVIDER_SITE_OTHER): Admitting: Urology

## 2023-12-03 ENCOUNTER — Encounter: Payer: Self-pay | Admitting: Urology

## 2023-12-03 ENCOUNTER — Other Ambulatory Visit (HOSPITAL_COMMUNITY): Payer: Self-pay

## 2023-12-03 VITALS — BP 163/71 | HR 74

## 2023-12-03 DIAGNOSIS — C61 Malignant neoplasm of prostate: Secondary | ICD-10-CM | POA: Diagnosis not present

## 2023-12-03 NOTE — Patient Instructions (Signed)

## 2023-12-03 NOTE — Progress Notes (Signed)
 12/03/2023 4:25 PM   Steven [name] 05/12/1951 [id number]  Referring provider: Rosanna Comment, FNP [id number] S. [address] [place],  [place] [address]  Followup prostate cancer    HPI: Steven [name] is a 73yo here for followup after prostate biopsy. Biopsy revealed Gleason 4+3=7 in 2/14 cores and Gleason 3+4=7 in 4/14 cores. He has a hx of radiation therapy 2012. IPSS 14 QOL 2 on uroxatral  10mg  daily. Urine stream stronger on the uroxatral . No straining to urinate. Nocturia 1-3x.    PMH: Past Medical History:  Diagnosis Date   Anxiety    Arthritis    Atrial fibrillation (HCC)    Atrial fibrillation (HCC)    Atrial flutter (HCC)    BPH (benign prostatic hyperplasia)    Chest pain    Dizziness    Dyspnea    laying down occ   Dysrhythmia    Fracture 08/17/2015   MULTIPLE RIB FRACTURES     FROM FALL    GERD (gastroesophageal reflux disease)    Hemorrhoids    History of kidney stones    noted on CT scan   History of radiation therapy 08/22/11-10/13/11   prostate   Hyperlipemia    Hypertension    Hyperthyroidism    IBS (irritable bowel syndrome)    Insomnia    Light headedness    Migraine    Numbness and tingling in left arm    Numbness and tingling of both legs    Open fracture of left elbow 08/18/2015   Pre-diabetes    Prostate cancer [facility])    prostate s/p radiation Mar 2013   Rib fractures 08/17/2015   Tinnitus     Surgical History: Past Surgical History:  Procedure Laterality Date   BOTOX  INJECTION N/A 01/14/2020   Procedure: INJECTION OF BOTOX  INTO ANAL SPHINCTER;  Surgeon: Melvenia Stabs, MD;  Location: [facility];  Service: General;  Laterality: N/A;   COLONOSCOPY N/A 12/19/2012   GEX:BMWUXL 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 Normal appearing colon   COLONOSCOPY WITH PROPOFOL  N/A 10/23/2019   Procedure: COLONOSCOPY WITH PROPOFOL ;  Surgeon: Suzette Espy, MD; external and grade 2  internal hemorrhoids, abnormal rectal blood vessels consistent with radiation proctitis s/p APC therapy, otherwise normal exam.   EVALUATION UNDER ANESTHESIA WITH ANAL FISTULECTOMY N/A 01/14/2020   Procedure: ANORECTAL EXAM UNDER ANESTHESIA;  Surgeon: Melvenia Stabs, MD;  Location: [facility];  Service: General;  Laterality: N/A;   HOT HEMOSTASIS  10/23/2019   Procedure: HOT HEMOSTASIS (ARGON PLASMA COAGULATION/BICAP);  Surgeon: Suzette Espy, MD;  Location: [facility];  Service: Endoscopy;;  apc rectal proctitis     KIDNEY SURGERY  1982   kidney tube collapse repair   PROSTATE BIOPSY N/A 11/15/2023   Procedure: BIOPSY, PROSTATE;  Surgeon: Marco Severs, MD;  Location: [facility];  Service: Urology;  Laterality: N/A;   PROSTATE BIOPSY N/A 11/15/2023   Procedure: BIOPSY, PROSTATE, RECTAL APPROACH, WITH US  GUIDANCE;  Surgeon: Marco Severs, MD;  Location: [facility];  Service: Urology;  Laterality: N/A;   RADIOACTIVE SEED IMPLANT     Prostate   SVT ABLATION N/A 12/12/2022   Procedure: SVT ABLATION;  Surgeon: Tammie Fall, MD;  Location: [facility] INVASIVE CV LAB;  Service: Cardiovascular;  Laterality: N/A;   TRANSRECTAL ULTRASOUND N/A 11/15/2023   Procedure: ULTRASOUND, RECTAL APPROACH;  Surgeon: Marco Severs, MD;  Location: [facility];  Service: Urology;  Laterality:  N/A;    Home Medications:  Allergies as of 12/03/2023       Reactions   Latex Rash, Other (See Comments)   Possible reaction to latex gloves per patient   Meclizine Other (See Comments)   Other reaction(s): Abdominal Pain, Other        Medication List        Accurate as of Dec 03, 2023  4:25 PM. If you have any questions, ask your nurse or doctor.          acetaminophen  500 MG tablet Commonly known as: TYLENOL  Take 1,000 mg by mouth every 8 (eight) hours as needed for moderate pain (pain score 4-6).   albuterol  108 (90 Base) MCG/ACT inhaler Commonly known as: VENTOLIN  HFA Inhale 2 puffs into the lungs  every 4 (four) hours as needed. What changed: reasons to take this   alfuzosin  10 MG 24 hr tablet Commonly known as: UROXATRAL  Take 1 tablet (10 mg total) by mouth daily with breakfast.   amLODipine  10 MG tablet Commonly known as: NORVASC  Take 1 tablet (10 mg total) by mouth daily.   azelastine  0.1 % nasal spray Commonly known as: ASTELIN  Place 1 spray into both nostrils 2 (two) times daily. Use in each nostril as directed What changed:  when to take this reasons to take this   Azelastine -Fluticasone  137-50 MCG/ACT Susp Place 1 spray into the nose every 12 (twelve) hours.   azithromycin  250 MG tablet Commonly known as: ZITHROMAX  Take 2 tablets on day 1, then 1 tablet daily on days 2 through 5   benzonatate  200 MG capsule Commonly known as: TESSALON  Take 1 capsule (200 mg total) by mouth 3 (three) times daily as needed for cough.   cloNIDine  0.1 MG tablet Commonly known as: CATAPRES  Take 1 tablet (0.1 mg total) by mouth 2 (two) times daily as needed (SBP>160).   DULoxetine  60 MG capsule Commonly known as: Cymbalta  Take 1 capsule (60 mg total) by mouth daily.   Eliquis  5 MG Tabs tablet Generic drug: apixaban  TAKE 1 TABLET(5 MG) BY MOUTH TWICE DAILY   Emgality  120 MG/ML Soaj Generic drug: Galcanezumab -gnlm Inject 1 Pen into the skin every 30 (thirty) days.   ezetimibe  10 MG tablet Commonly known as: Zetia  Take 1 tablet (10 mg total) by mouth daily.   furosemide  20 MG tablet Commonly known as: LASIX  Take 1 tablet (20 mg total) by mouth daily. Take with or after meal.   guaiFENesin -codeine 100-10 MG/5ML syrup Take 5 mLs by mouth 3 (three) times daily as needed for cough.   hydrOXYzine  10 MG tablet Commonly known as: ATARAX  Take 1 tablet (10 mg total) by mouth 3 (three) times daily as needed for anxiety.   hyoscyamine  0.375 MG 12 hr tablet Commonly known as: LEVBID Take 0.375 mg by mouth every 12 (twelve) hours as needed for cramping.   loratadine  10 MG  tablet Commonly known as: CLARITIN  Take 1 tablet (10 mg total) by mouth daily. What changed:  when to take this reasons to take this   losartan  100 MG tablet Commonly known as: COZAAR  Take 100 mg by mouth daily.   methimazole  5 MG tablet Commonly known as: TAPAZOLE  Take 0.5 tablets (2.5 mg total) by mouth daily.   methylPREDNISolone 4 MG Tbpk tablet Commonly known as: MEDROL DOSEPAK Take as package instructions.   metoprolol  tartrate 25 MG tablet Commonly known as: LOPRESSOR  Take 1 tablet (25 mg total) by mouth 2 (two) times daily. TAKE EXTRA HALF TABLET  AS  NEEDED What changed: additional instructions   mirtazapine  15 MG tablet Commonly known as: REMERON  TAKE 1 TABLET(15 MG) BY MOUTH AT BEDTIME   Nurtec 75 MG Tbdp Generic drug: Rimegepant Sulfate Take 75 mg by mouth daily as needed (migraine).   omeprazole  20 MG capsule Commonly known as: PRILOSEC Take 20 mg by mouth daily.   ondansetron  4 MG tablet Commonly known as: Zofran  Take 1 tablet (4 mg total) by mouth every 8 (eight) hours as needed for nausea or vomiting.   polyethylene glycol 17 g packet Commonly known as: MIRALAX  / GLYCOLAX  Take 17 g by mouth daily.   potassium chloride  SA 20 MEQ tablet Commonly known as: KLOR-CON  M Take 1 tablet (20 mEq total) by mouth daily. What changed: how much to take   pregabalin  150 MG capsule Commonly known as: LYRICA  Take 1 capsule (150 mg total) by mouth 2 (two) times daily.   tiZANidine  4 MG tablet Commonly known as: Zanaflex  Take 1 tablet (4 mg total) by mouth every 6 (six) hours as needed for muscle spasms.   traMADol  50 MG tablet Commonly known as: Ultram  Take 1 tablet (50 mg total) by mouth every 6 (six) hours as needed.   triamcinolone  cream 0.1 % Commonly known as: KENALOG  Apply 1 Application topically 2 (two) times daily.   Ubrelvy  100 MG Tabs Generic drug: Ubrogepant  Take 1 tablet (100 mg total) by mouth as needed (for headaches). May repeat a dose in  2 hours if needed. Max dose 2 pills in 24 hours   vitamin B-12 100 MCG tablet Commonly known as: CYANOCOBALAMIN  Take 100 mcg by mouth daily.   Vitamin D3 125 MCG (5000 UT) Caps Take 5,000 Units by mouth daily.        Allergies:  Allergies  Allergen Reactions   Latex Rash and Other (See Comments)    Possible reaction to latex gloves per patient   Meclizine Other (See Comments)    Other reaction(s): Abdominal Pain, Other    Family History: Family History  Problem Relation Age of Onset   Aneurysm Mother        aortic   Hypertension Mother    Headache Mother    Prostate cancer Father    Hypertension Father    Heart failure Brother    Colon cancer Neg Hx    Colon polyps Neg Hx     Social History:  reports that he quit smoking about 5 years ago. His smoking use included cigarettes. He started smoking about 25 years ago. He has a 20 pack-year smoking history. He has been exposed to tobacco smoke. He has never used smokeless tobacco. He reports that he does not currently use alcohol. He reports that he does not use drugs.  ROS: All other review of systems were reviewed and are negative except what is noted above in HPI  Physical Exam: BP (!) 163/71   Pulse 74   Constitutional:  Alert and oriented, No acute distress. HEENT: [place] AT, moist mucus membranes.  Trachea midline, no masses. Cardiovascular: No clubbing, cyanosis, or edema. Respiratory: Normal respiratory effort, no increased work of breathing. GI: Abdomen is soft, nontender, nondistended, no abdominal masses GU: No CVA tenderness.  Lymph: No cervical or inguinal lymphadenopathy. Skin: No rashes, bruises or suspicious lesions. Neurologic: Grossly intact, no focal deficits, moving all 4 extremities. Psychiatric: Normal mood and affect.  Laboratory Data: Lab Results  Component Value Date   WBC 4.1 10/18/2023   HGB 13.0 10/18/2023   HCT 40.6 10/18/2023  MCV 86 10/18/2023   PLT 247 10/18/2023    Lab Results   Component Value Date   CREATININE 0.90 10/09/2023    Lab Results  Component Value Date   PSA 1.8 12/10/2019   PSA 2.3 11/10/2019    No results found for: "TESTOSTERONE "  Lab Results  Component Value Date   HGBA1C 5.6 10/09/2023    Urinalysis    Component Value Date/Time   COLORURINE STRAW (A) 01/07/2023 1953   APPEARANCEUR Clear 09/14/2023 0938   LABSPEC 1.008 01/07/2023 1953   PHURINE 7.0 01/07/2023 1953   GLUCOSEU Negative 09/14/2023 0938   HGBUR NEGATIVE 01/07/2023 1953   BILIRUBINUR Negative 09/14/2023 0938   KETONESUR NEGATIVE 01/07/2023 1953   PROTEINUR Negative 09/14/2023 0938   PROTEINUR NEGATIVE 01/07/2023 1953   UROBILINOGEN negative (A) 12/12/2019 1113   UROBILINOGEN 1.0 08/03/2012 1156   NITRITE Negative 09/14/2023 0938   NITRITE NEGATIVE 01/07/2023 1953   LEUKOCYTESUR Negative 09/14/2023 0938   LEUKOCYTESUR NEGATIVE 01/07/2023 1953    Lab Results  Component Value Date   LABMICR Comment 09/14/2023   WBCUA None seen 03/02/2020   LABEPIT None seen 03/02/2020   BACTERIA None seen 03/02/2020    Pertinent Imaging:  Results for orders placed during the hospital encounter of 11/07/19  DG Abdomen 1 View  Narrative CLINICAL DATA:  Constipation. Hemorrhoids. Irritable bowel syndrome. Prostate cancer.  EXAM: ABDOMEN - 1 VIEW  COMPARISON:  CT pelvis 09/25/2019 in CT abdomen 04/29/2019  FINDINGS: Oval-shaped densities along the midline of the upper abdomen, possibly in the stomach. The patient had gallstones on the 04/29/2019 exam but these densities appear more regular and more medially located than expected location for gallstones. There is another oval-shaped density projecting over the transverse colon and 1 along the rectum, again I suspect that these are within stomach/bowel.  The tiny punctate right kidney lower pole renal calculus shown on 04/29/2019 is not readily seen on today's conventional radiographs.  Dextroconvex lumbar  scoliosis with rotary component. No dilated bowel. Bowel gas pattern appears otherwise unremarkable.  IMPRESSION: 1. Oval-shaped densities in the abdomen are thought to be in the stomach and bowel. 2. No dilated bowel.  Unremarkable bowel gas pattern. 3. Dextroconvex lumbar scoliosis with rotary component.   Electronically Signed By: Freida Jes M.D. On: 11/07/2019 17:35  No results found for this or any previous visit.  No results found for this or any previous visit.  No results found for this or any previous visit.  No results found for this or any previous visit.  No results found for this or any previous visit.  No results found for this or any previous visit.  No results found for this or any previous visit.   Assessment & Plan:    1. Prostate cancer (HCC) (Primary) I discussed the natural history of recurrent prostate cancer with the patient and the various treatment options including active surveillance, salvage prostatectomy, cryotherapy, HIFU and ADT. After discussing the options the patient elects for surveillance. Followup 3 months with a PSA      No follow-ups on file.  Johnie Nailer, MD  [facility] [place]

## 2023-12-11 ENCOUNTER — Ambulatory Visit: Payer: Self-pay

## 2023-12-11 ENCOUNTER — Encounter: Payer: Self-pay | Admitting: *Deleted

## 2023-12-11 NOTE — Telephone Encounter (Signed)
Noted, patient scheduled.

## 2023-12-11 NOTE — Telephone Encounter (Signed)
 Called pt - LMOM to return our call.  Copied From CRM [phone]. Reason for Triage: finished all medications but still wheezing, please call [phone]

## 2023-12-11 NOTE — Telephone Encounter (Signed)
  Called pt - left message on machine to return our call. 3 attempts without making contact with pt. I will forward to clinic for follow up.   Copied From CRM [id number]. Reason for Triage: finished all medications but still wheezing, please call [phone]

## 2023-12-11 NOTE — Telephone Encounter (Signed)
 Called pt - left message on machine to return our call.   Copied From CRM [id number]. Reason for Triage: finished all medications but still wheezing, please call [phone]

## 2023-12-11 NOTE — Telephone Encounter (Signed)
 Chief Complaint: wheezing comes and goes , continued cough since completing course of antibiotics from 11/30/23.  Symptoms: cough persists. Not coughing up mucus and if mucus present clear. Noted wheezing comes and goes . No SOB  wheezing noted laying down  Frequency: since 11/30/23 Pertinent Negatives: Patient denies chest pain no difficulty breathing no fever Disposition: [] ED /[] Urgent Care (no appt availability in office) / [x] Appointment(In office/virtual)/ []  [facility]/ [] Home Care/ [] Refused Recommended Disposition /[]  Mobile Bus/ []  Follow-up with PCP Additional Notes:   Please advise if patient should get additional medication since last OV 11/30/23. Almost out of inhaler. Requesting refill or if Dr. Lydia Sams feels he needs inhaler daily. Appt scheduled with PCP on 12/13/23. Please advise if appt needed . Patient requesting call back regarding medication. Recommended if sx worsen go to ED.      1. ONSET: "When did the cough begin?"      On going since last OV 11/30/23 but better 2. SEVERITY: "How bad is the cough today?"      Not severe but noted wheezing at times 3. SPUTUM: "Describe the color of your sputum" (none, dry cough; clear, white, yellow, green)     Clear  4. HEMOPTYSIS: "Are you coughing up any blood?" If so ask: "How much?" (flecks, streaks, tablespoons, etc.)     na 5. DIFFICULTY BREATHING: "Are you having difficulty breathing?" If Yes, ask: "How bad is it?" (e.g., mild, moderate, severe)    - MILD: No SOB at rest, mild SOB with walking, speaks normally in sentences, can lie down, no retractions, pulse < 100.    - MODERATE: SOB at rest, SOB with minimal exertion and prefers to sit, cannot lie down flat, speaks in phrases, mild retractions, audible wheezing, pulse 100-120.    - SEVERE: Very SOB at rest, speaks in single words, struggling to breathe, sitting hunched forward, retractions, pulse > 120      Denies  6. FEVER: "Do you have a fever?" If Yes,  ask: "What is your temperature, how was it measured, and when did it start?"     Na  7. CARDIAC HISTORY: "Do you have any history of heart disease?" (e.g., heart attack, congestive heart failure)      See hx  8. LUNG HISTORY: "Do you have any history of lung disease?"  (e.g., pulmonary embolus, asthma, emphysema)     See hx 9. PE RISK FACTORS: "Do you have a history of blood clots?" (or: recent major surgery, recent prolonged travel, bedridden)     Na  10. OTHER SYMPTOMS: "Do you have any other symptoms?" (e.g., runny nose, wheezing, chest pain)       Cough continues but better, wheezing comes and goes. Worsening laying down  11. PREGNANCY: "Is there any chance you are pregnant?" "When was your last menstrual period?"       NA 12. TRAVEL: "Have you traveled out of the country in the last month?" (e.g., travel history, exposures)       Na Reason for Disposition . Cough has been present for > 3 weeks    Cough since 11/30/23  Answer Assessment - Initial Assessment Questions 1. ONSET: "When did the cough begin?"      On going since last OV 11/30/23 but better 2. SEVERITY: "How bad is the cough today?"      Not severe but noted wheezing at times 3. SPUTUM: "Describe the color of your sputum" (none, dry cough; clear, white, yellow, green)  Clear  4. HEMOPTYSIS: "Are you coughing up any blood?" If so ask: "How much?" (flecks, streaks, tablespoons, etc.)     na 5. DIFFICULTY BREATHING: "Are you having difficulty breathing?" If Yes, ask: "How bad is it?" (e.g., mild, moderate, severe)    - MILD: No SOB at rest, mild SOB with walking, speaks normally in sentences, can lie down, no retractions, pulse < 100.    - MODERATE: SOB at rest, SOB with minimal exertion and prefers to sit, cannot lie down flat, speaks in phrases, mild retractions, audible wheezing, pulse 100-120.    - SEVERE: Very SOB at rest, speaks in single words, struggling to breathe, sitting hunched forward, retractions, pulse > 120       Denies  6. FEVER: "Do you have a fever?" If Yes, ask: "What is your temperature, how was it measured, and when did it start?"     Na  7. CARDIAC HISTORY: "Do you have any history of heart disease?" (e.g., heart attack, congestive heart failure)      See hx  8. LUNG HISTORY: "Do you have any history of lung disease?"  (e.g., pulmonary embolus, asthma, emphysema)     See hx 9. PE RISK FACTORS: "Do you have a history of blood clots?" (or: recent major surgery, recent prolonged travel, bedridden)     Na  10. OTHER SYMPTOMS: "Do you have any other symptoms?" (e.g., runny nose, wheezing, chest pain)       Cough continues but better, wheezing comes and goes. Worsening laying down  11. PREGNANCY: "Is there any chance you are pregnant?" "When was your last menstrual period?"       NA 12. TRAVEL: "Have you traveled out of the country in the last month?" (e.g., travel history, exposures)       Na  Protocols used: Cough - Acute Non-Productive-A-AH

## 2023-12-11 NOTE — Telephone Encounter (Signed)
  This encounter was created in error - please disregard Please see NT encounter from today

## 2023-12-13 ENCOUNTER — Ambulatory Visit (INDEPENDENT_AMBULATORY_CARE_PROVIDER_SITE_OTHER): Admitting: Family Medicine

## 2023-12-13 ENCOUNTER — Ambulatory Visit (HOSPITAL_COMMUNITY)
Admission: RE | Admit: 2023-12-13 | Discharge: 2023-12-13 | Disposition: A | Discharge status: Home / Self Care | Source: Ambulatory Visit | Attending: Family Medicine | Admitting: Family Medicine

## 2023-12-13 VITALS — BP 149/83 | HR 62 | Ht 67.0 in | Wt 236.2 lb

## 2023-12-13 DIAGNOSIS — J069 Acute upper respiratory infection, unspecified: Secondary | ICD-10-CM | POA: Diagnosis not present

## 2023-12-13 DIAGNOSIS — R0602 Shortness of breath: Secondary | ICD-10-CM | POA: Diagnosis not present

## 2023-12-13 DIAGNOSIS — R059 Cough, unspecified: Secondary | ICD-10-CM | POA: Diagnosis not present

## 2023-12-13 DIAGNOSIS — R062 Wheezing: Secondary | ICD-10-CM | POA: Diagnosis not present

## 2023-12-13 MED ORDER — ALBUTEROL SULFATE HFA 108 (90 BASE) MCG/ACT IN AERS: 2.0000 | INHALATION_SPRAY | RESPIRATORY_TRACT | 2 refills | Status: DC | PRN

## 2023-12-13 MED ORDER — CEFDINIR 300 MG PO CAPS
300.0000 mg | ORAL_CAPSULE | Freq: Two times a day (BID) | ORAL | 0 refills | Status: AC
Start: 2023-12-13 — End: 2023-12-20

## 2023-12-13 NOTE — Progress Notes (Signed)
 Established Patient Office Visit   Subjective  Patient ID: Steven [name], male    DOB: [birth year]  Age: 73 y.o. MRN: [id number]  Chief Complaint  Patient presents with   Cough   Wheezing    He  has a past medical history of Anxiety, Arthritis, Atrial fibrillation (HCC), Atrial fibrillation (HCC), Atrial flutter (HCC), BPH (benign prostatic hyperplasia), Chest pain, Dizziness, Dyspnea, Dysrhythmia, Fracture (08/17/2015), GERD (gastroesophageal reflux disease), Hemorrhoids, History of kidney stones, History of radiation therapy (08/22/11-10/13/11), Hyperlipemia, Hypertension, Hyperthyroidism, IBS (irritable bowel syndrome), Insomnia, Light headedness, Migraine, Numbness and tingling in left arm, Numbness and tingling of both legs, Open fracture of left elbow (08/18/2015), Pre-diabetes, Prostate cancer (HCC), Rib fractures (08/17/2015), and Tinnitus.  Patient presents with a persistent dry cough that began a few weeks ago and has remained unchanged. Associated symptoms include shortness of breath at rest, fatigue, malaise, sore throat, and wheezing. The patient denies chest pain, nausea, or vomiting. Two weeks ago, patient were treated with oral azithromycin  and prednisone , which provided only partial relief. Despite treatment, symptoms have continued without significant improvement. The patient's pulmonary history is notable for a prior episode of acute bronchitis    Review of Systems  Constitutional:  Negative for chills and fever.  Respiratory:  Positive for cough, shortness of breath and wheezing.   Cardiovascular:  Negative for chest pain.  Neurological:  Positive for headaches. Negative for dizziness.      Objective:     BP (!) 149/83   Pulse 62   Ht 5\' 7"  (1.702 m)   Wt 236 lb 4 oz (107.2 kg)   SpO2 95%   BMI 37.00 kg/m  BP Readings from Last 3 Encounters:  12/13/23 (!) 149/83  12/03/23 (!) 163/71  11/30/23 (!) 143/74      Physical Exam Vitals reviewed.   Constitutional:      General: He is not in acute distress.    Appearance: Normal appearance. He is not ill-appearing, toxic-appearing or diaphoretic.  HENT:     Head: Normocephalic.  Eyes:     General:        Right eye: No discharge.        Left eye: No discharge.     Conjunctiva/sclera: Conjunctivae normal.  Cardiovascular:     Rate and Rhythm: Normal rate.     Pulses: Normal pulses.  Pulmonary:     Effort: Pulmonary effort is normal. No respiratory distress.     Breath sounds: Wheezing present.  Skin:    General: Skin is warm and dry.     Capillary Refill: Capillary refill takes less than 2 seconds.  Neurological:     Mental Status: He is alert.  Psychiatric:        Mood and Affect: Mood normal.        Behavior: Behavior normal.      No results found for any visits on 12/13/23.  The 10-year ASCVD risk score (Arnett DK, et al., 2019) is: 25.5%    Assessment & Plan:  SOB (shortness of breath) -     DG Chest 2 View; Future  Upper respiratory tract infection, unspecified type Assessment & Plan: Trial on omnicef 300 mg twice daily x 7 days Chest xray ordered  Advise patient to rest to support your body's recovery. Stay hydrated by drinking water , tea, or broth. Using a humidifier can help soothe throat irritation and ease nasal congestion. For fever or pain, acetaminophen  (Tylenol ) is recommended. To relieve other symptoms, try saline nasal sprays,  throat lozenges, or gargling with saltwater. Focus on eating light, healthy meals like fruits and vegetables to keep your strength up. Practice good hygiene by washing your hands frequently and covering your mouth when coughing or sneezing.Follow-up for worsening or persistent symptoms. Patient verbalizes understanding regarding plan of care and all questions answered     Orders: -     Cefdinir; Take 1 capsule (300 mg total) by mouth 2 (two) times daily for 7 days.  Dispense: 14 capsule; Refill: 0 -     Albuterol  Sulfate HFA;  Inhale 2 puffs into the lungs every 4 (four) hours as needed.  Dispense: 18 g; Refill: 2    Return if symptoms worsen or fail to improve.   Avelino Lek Amber Bail, FNP

## 2023-12-13 NOTE — Assessment & Plan Note (Signed)
 Trial on omnicef 300 mg twice daily x 7 days Chest xray ordered  Advise patient to rest to support your body's recovery. Stay hydrated by drinking water , tea, or broth. Using a humidifier can help soothe throat irritation and ease nasal congestion. For fever or pain, acetaminophen  (Tylenol ) is recommended. To relieve other symptoms, try saline nasal sprays, throat lozenges, or gargling with saltwater. Focus on eating light, healthy meals like fruits and vegetables to keep your strength up. Practice good hygiene by washing your hands frequently and covering your mouth when coughing or sneezing.Follow-up for worsening or persistent symptoms. Patient verbalizes understanding regarding plan of care and all questions answered

## 2023-12-13 NOTE — Patient Instructions (Signed)

## 2023-12-20 ENCOUNTER — Ambulatory Visit: Payer: Self-pay | Admitting: Family Medicine

## 2023-12-24 ENCOUNTER — Other Ambulatory Visit: Payer: Self-pay | Admitting: "Endocrinology

## 2023-12-25 ENCOUNTER — Other Ambulatory Visit: Payer: Self-pay | Admitting: Family Medicine

## 2023-12-25 ENCOUNTER — Ambulatory Visit: Payer: Self-pay | Admitting: *Deleted

## 2023-12-25 MED ORDER — PREGABALIN 150 MG PO CAPS: 150.0000 mg | ORAL_CAPSULE | Freq: Two times a day (BID) | ORAL | 2 refills | Status: DC

## 2023-12-25 NOTE — Telephone Encounter (Signed)
  Chief Complaint: pain bilateral knees and hips Symptoms: pain bilateral knees and hips worsening with pain comes and goes can start in toes and move up leg and "takes breath away" has had nausea after pain episodes.  Legs feel weak at times. No falls. Sharp stabbing pain at times. Pain lingers. Gets headache at times after pain episodes. Frequency: on going  Pertinent Negatives: Patient denies fever no chest pain no difficulty breathing reported Disposition: [] ED /[] Urgent Care (no appt availability in office) / [x] Appointment(In office/virtual)/ []  [facility]/ [] Home Care/ [] Refused Recommended Disposition /[] [facility]/ []  Follow-up with PCP Additional Notes:   Patient has been to pain management and "they" could not find source or reason for pain. Appt scheduled with other provider  01/01/24. None available with PCP until July. Recommended if pain worsens call back or go to ED.      Copied from CRM [id number]. Topic: Clinical - Red Word Triage >> Dec 25, 2023 12:15 PM Oddis Bench wrote: Red Word that prompted transfer to Nurse Triage: Patient is calling about pain he is stating that it is all over, he states that sometimes it will take his breath away and it starts in feet. Reason for Disposition  [1] MODERATE pain (e.g., interferes with normal activities) AND [2] present > 3 days  Answer Assessment - Initial Assessment Questions 1. ONSET: "When did the muscle aches or body pains start?"      Pain in knees and hips 2. LOCATION: "What part of your body is hurting?" (e.g., entire body, arms, legs)      Bilateral knees and hips  3. SEVERITY: "How bad is the pain?" (Scale 1-10; or mild, moderate, severe)   - MILD (1-3): doesn't interfere with normal activities    - MODERATE (4-7): interferes with normal activities or awakens from sleep    - SEVERE (8-10):  excruciating pain, unable to do any normal activities      Can walk but knees and hip pain noted 4. CAUSE: "What do  you think is causing the pains?"     Not sure  5. FEVER: "Have you been having fever?"     na 6. OTHER SYMPTOMS: "Do you have any other symptoms?" (e.g., chest pain, weakness, rash, cold or flu symptoms, weight loss)     Weakness in legs but can support self . Sharp stabbing pain at times 7. PREGNANCY: "Is there any chance you are pregnant?" "When was your last menstrual period?"     na 8. TRAVEL: "Have you traveled out of the country in the last month?" (e.g., travel history, exposures)     na  Protocols used: Muscle Aches and Body Pain-A-AH

## 2023-12-25 NOTE — Telephone Encounter (Signed)
 Patient scheduled.

## 2023-12-25 NOTE — Telephone Encounter (Unsigned)
 Copied from CRM [id number]. Topic: Clinical - Medication Refill >> Dec 25, 2023 12:09 PM Oddis Bench wrote: Medication: pregabalin  (LYRICA ) 150 MG capsule  Has the patient contacted their pharmacy? No (Agent: If no, request that the patient contact the pharmacy for the refill. If patient does not wish to contact the pharmacy document the reason why and proceed with request.) (Agent: If yes, when and what did the pharmacy advise?)  This is the patient's preferred pharmacy:  [id number], [place] - [phone] [address] [place]  [address] [place] [address] Phone: [phone]  Patient would like to be moved up to 200mg  Is this the correct pharmacy for this prescription? Yes If no, delete pharmacy and type the correct one.   Has the prescription been filled recently? Yes  Is the patient out of the medication? Yes  Has the patient been seen for an appointment in the last year OR does the patient have an upcoming appointment? Yes  Can we respond through MyChart? Yes  Agent: Please be advised that Rx refills may take up to 3 business days. We ask that you follow-up with your pharmacy.

## 2023-12-28 DIAGNOSIS — Z6838 Body mass index (BMI) 38.0-38.9, adult: Secondary | ICD-10-CM | POA: Diagnosis not present

## 2023-12-28 DIAGNOSIS — M4802 Spinal stenosis, cervical region: Secondary | ICD-10-CM | POA: Diagnosis not present

## 2024-01-01 ENCOUNTER — Ambulatory Visit (INDEPENDENT_AMBULATORY_CARE_PROVIDER_SITE_OTHER): Payer: Self-pay | Admitting: Internal Medicine

## 2024-01-01 ENCOUNTER — Encounter: Payer: Self-pay | Admitting: Internal Medicine

## 2024-01-01 VITALS — BP 132/84 | HR 60 | Ht 67.0 in | Wt 242.6 lb

## 2024-01-01 DIAGNOSIS — G8929 Other chronic pain: Secondary | ICD-10-CM | POA: Insufficient documentation

## 2024-01-01 DIAGNOSIS — R052 Subacute cough: Secondary | ICD-10-CM

## 2024-01-01 DIAGNOSIS — M25561 Pain in right knee: Secondary | ICD-10-CM

## 2024-01-01 DIAGNOSIS — J42 Unspecified chronic bronchitis: Secondary | ICD-10-CM | POA: Diagnosis not present

## 2024-01-01 DIAGNOSIS — M25562 Pain in left knee: Secondary | ICD-10-CM | POA: Diagnosis not present

## 2024-01-01 DIAGNOSIS — M51362 Other intervertebral disc degeneration, lumbar region with discogenic back pain and lower extremity pain: Secondary | ICD-10-CM | POA: Insufficient documentation

## 2024-01-01 MED ORDER — TRAMADOL HCL 50 MG PO TABS: 50.0000 mg | ORAL_TABLET | Freq: Two times a day (BID) | ORAL | 0 refills | Status: DC | PRN

## 2024-01-01 MED ORDER — TIZANIDINE HCL 4 MG PO TABS
4.0000 mg | ORAL_TABLET | Freq: Two times a day (BID) | ORAL | 0 refills | Status: DC | PRN
Start: 2024-01-01 — End: 2024-04-28

## 2024-01-01 MED ORDER — BENZONATATE 200 MG PO CAPS: 200.0000 mg | ORAL_CAPSULE | Freq: Two times a day (BID) | ORAL | 0 refills | Status: DC | PRN

## 2024-01-01 NOTE — Progress Notes (Unsigned)
 Acute Office Visit  Subjective:    Patient ID: Steven [name], male    DOB: 1951/06/21, 73 y.o.   MRN: [id number]  Chief Complaint  Patient presents with   Knee Pain    Pt reports knee pain   Hip Pain    Reports hip pain , ongoing for a few months, mostly on his right side.    Wheezing    Reports slight cough and wheezing in his chest.     HPI Patient is in today for complaint of bilateral knee pain, which is chronic, constant, dull, worse with prolonged standing and walking.  He has noticed swelling of bilateral knee as well.  He has tried taking Tylenol  without much relief.  He is unable to take oral NSAIDs due to anticoagulation with Eliquis .  He also reports chronic low back pain and right hip pain, but points it to the thigh area.  Pain is intermittent, dull and worse with bending.  Denies any recent injury.  Denies saddle anesthesia, urinary or stool incontinence.  He also reports intermittent wheezing and dry cough since for the last 1 month.  He has had 2 courses of antibiotics in the last 1 month.  He initially had oral steroids in the last month.  He has albuterol  inhaler for as needed use, which improves his dyspnea and wheezing.  His chest x-ray did not show any signs of infection about 3 weeks ago, when he had acute symptoms.  Past Medical History:  Diagnosis Date   Anxiety    Arthritis    Atrial fibrillation (HCC)    Atrial fibrillation (HCC)    Atrial flutter (HCC)    BPH (benign prostatic hyperplasia)    Chest pain    Dizziness    Dyspnea    laying down occ   Dysrhythmia    Fracture 08/17/2015   MULTIPLE RIB FRACTURES     FROM FALL    GERD (gastroesophageal reflux disease)    Hemorrhoids    History of kidney stones    noted on CT scan   History of radiation therapy 08/22/11-10/13/11   prostate   Hyperlipemia    Hypertension    Hyperthyroidism    IBS (irritable bowel syndrome)    Insomnia    Light headedness    Migraine    Numbness and tingling in left  arm    Numbness and tingling of both legs    Open fracture of left elbow 08/18/2015   Pre-diabetes    Prostate cancer [facility])    prostate s/p radiation Mar 2013   Rib fractures 08/17/2015   Tinnitus     Past Surgical History:  Procedure Laterality Date   BOTOX  INJECTION N/A 01/14/2020   Procedure: INJECTION OF BOTOX  INTO ANAL SPHINCTER;  Surgeon: Melvenia Stabs, MD;  Location: [facility];  Service: General;  Laterality: N/A;   COLONOSCOPY N/A 12/19/2012   JXB:[id number] 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 Normal appearing colon   COLONOSCOPY WITH PROPOFOL  N/A 10/23/2019   Procedure: COLONOSCOPY WITH PROPOFOL ;  Surgeon: Suzette Espy, MD; external and grade 2 internal hemorrhoids, abnormal rectal blood vessels consistent with radiation proctitis s/p APC therapy, otherwise normal exam.   EVALUATION UNDER ANESTHESIA WITH ANAL FISTULECTOMY N/A 01/14/2020   Procedure: ANORECTAL EXAM UNDER ANESTHESIA;  Surgeon: Melvenia Stabs, MD;  Location: [facility];  Service: General;  Laterality: N/A;   HOT HEMOSTASIS  10/23/2019   Procedure: HOT HEMOSTASIS (ARGON PLASMA  COAGULATION/BICAP);  Surgeon: Suzette Espy, MD;  Location: [facility];  Service: Endoscopy;;  apc rectal proctitis     KIDNEY SURGERY  1982   kidney tube collapse repair   PROSTATE BIOPSY N/A 11/15/2023   Procedure: BIOPSY, PROSTATE;  Surgeon: Marco Severs, MD;  Location: [facility];  Service: Urology;  Laterality: N/A;   PROSTATE BIOPSY N/A 11/15/2023   Procedure: BIOPSY, PROSTATE, RECTAL APPROACH, WITH US  GUIDANCE;  Surgeon: Marco Severs, MD;  Location: [facility];  Service: Urology;  Laterality: N/A;   RADIOACTIVE SEED IMPLANT     Prostate   SVT ABLATION N/A 12/12/2022   Procedure: SVT ABLATION;  Surgeon: Tammie Fall, MD;  Location: [facility] IN[facility]IVE CV LAB;  Service: Cardiovascular;  Laterality: N/A;   TRANSRECTAL ULTRASOUND N/A 11/15/2023   Procedure: ULTRASOUND,  RECTAL APPROACH;  Surgeon: Marco Severs, MD;  Location: [facility];  Service: Urology;  Laterality: N/A;    Family History  Problem Relation Age of Onset   Aneurysm Mother        aortic   Hypertension Mother    Headache Mother    Prostate cancer Father    Hypertension Father    Heart failure Brother    Colon cancer Neg Hx    Colon polyps Neg Hx     Social History   Socioeconomic History   Marital status: Single    Spouse name: Not on file   Number of children: 2   Years of education: 12   Highest education level: 12th grade  Occupational History   Occupation: Custodian     Employer: [employer]  Tobacco Use   Smoking status: Former    Current packs/day: 0.00    Average packs/day: 1 pack/day for 20.0 years (20.0 ttl pk-yrs)    Types: Cigarettes    Start date: 2000    Quit date: 2020    Years since quitting: 5.4    Passive exposure: Past   Smokeless tobacco: Never   Tobacco comments:    Quit smoking x 2 years    07/2015   SOMETIIMES i USE VAPOR     Smoking Cessation Classes, Agencies, Aeronautical engineer Use   Vaping status: Never Used  Substance and Sexual Activity   Alcohol use: Not Currently   Drug use: Never   Sexual activity: Not Currently    Partners: Female  Other Topics Concern   Not on file  Social History Narrative   Lives with friend   Divorced   No caffeine   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   Social Drivers of Corporate investment banker Strain: Low Risk  (06/25/2023)   Overall Financial Resource Strain (CARDIA)    Difficulty of Paying Living Expenses: Not hard at all  Recent Concern: Financial Resource Strain - High Risk (05/08/2023)   Overall Financial Resource Strain (CARDIA)    Difficulty of Paying Living Expenses: Hard  Food Insecurity: No Food Insecurity (06/25/2023)   Hunger Vital Sign    Worried About Running Out of Food in the Last Year: Never true    Ran Out of Food in the Last  Year: Never true  Transportation Needs: No Transportation Needs (06/25/2023)   PRAPARE - Administrator, Civil Service (Medical): No    Lack of Transportation (Non-Medical): No  Physical Activity: Sufficiently Active (06/25/2023)   Exercise Vital Sign    Days of Exercise per Week: 7 days  Minutes of Exercise per Session: 50 min  Stress: Stress Concern Present (06/25/2023)   [employer] of Occupational Health - Occupational Stress Questionnaire    Feeling of Stress : To some extent  Social Connections: Moderately Integrated (06/25/2023)   Social Connection and Isolation Panel    Frequency of Communication with Friends and Family: More than three times a week    Frequency of Social Gatherings with Friends and Family: More than three times a week    Attends Religious Services: More than 4 times per year    Active Member of [employer] or Organizations: No    Attends Banker Meetings: Never    Marital Status: Living with partner  Intimate Partner Violence: Not At Risk (06/25/2023)   Humiliation, Afraid, Rape, and Kick questionnaire    Fear of Current or Ex-Partner: No    Emotionally Abused: No    Physically Abused: No    Sexually Abused: No    Outpatient Medications Prior to Visit  Medication Sig Dispense Refill   acetaminophen  (TYLENOL ) 500 MG tablet Take 1,000 mg by mouth every 8 (eight) hours as needed for moderate pain (pain score 4-6).     albuterol  (VENTOLIN  HFA) 108 (90 Base) MCG/ACT inhaler Inhale 2 puffs into the lungs every 4 (four) hours as needed. 18 g 2   alfuzosin  (UROXATRAL ) 10 MG 24 hr tablet Take 1 tablet (10 mg total) by mouth daily with breakfast. 90 tablet 3   azelastine  (ASTELIN ) 0.1 % nasal spray Place 1 spray into both nostrils 2 (two) times daily. Use in each nostril as directed (Patient taking differently: Place 1 spray into both nostrils 2 (two) times daily as needed (Congestion). Use in each nostril as directed) 30 mL 0    Azelastine -Fluticasone  137-50 MCG/ACT SUSP Place 1 spray into the nose every 12 (twelve) hours. 23 g 1   Cholecalciferol (VITAMIN D3) 125 MCG (5000 UT) CAPS Take 5,000 Units by mouth daily.     cloNIDine  (CATAPRES ) 0.1 MG tablet Take 1 tablet (0.1 mg total) by mouth 2 (two) times daily as needed (SBP>160). 60 tablet 11   DULoxetine  (CYMBALTA ) 60 MG capsule Take 1 capsule (60 mg total) by mouth daily. 90 capsule 3   ELIQUIS  5 MG TABS tablet TAKE 1 TABLET(5 MG) BY MOUTH TWICE DAILY 60 tablet 5   ezetimibe  (ZETIA ) 10 MG tablet TAKE 1 TABLET(10 MG) BY MOUTH DAILY 90 tablet 1   furosemide  (LASIX ) 20 MG tablet Take 1 tablet (20 mg total) by mouth daily. Take with or after meal. 90 tablet 1   Galcanezumab -gnlm (EMGALITY ) 120 MG/ML SOAJ Inject 1 Pen into the skin every 30 (thirty) days. 1.12 mL 7   guaiFENesin -codeine  100-10 MG/5ML syrup Take 5 mLs by mouth 3 (three) times daily as needed for cough. 120 mL 0   hydrOXYzine  (ATARAX ) 10 MG tablet Take 1 tablet (10 mg total) by mouth 3 (three) times daily as needed for anxiety. 30 tablet 0   hyoscyamine  (LEVBID) 0.375 MG 12 hr tablet Take 0.375 mg by mouth every 12 (twelve) hours as needed for cramping.     loratadine  (CLARITIN ) 10 MG tablet Take 1 tablet (10 mg total) by mouth daily. (Patient taking differently: Take 10 mg by mouth daily as needed for allergies.) 30 tablet 11   losartan  (COZAAR ) 100 MG tablet Take 100 mg by mouth daily.     methimazole  (TAPAZOLE ) 5 MG tablet Take 0.5 tablets (2.5 mg total) by mouth daily. 90 tablet 0   mirtazapine  (  REMERON ) 15 MG tablet TAKE 1 TABLET(15 MG) BY MOUTH AT BEDTIME 30 tablet 2   NURTEC 75 MG TBDP Take 75 mg by mouth daily as needed (migraine).     omeprazole  (PRILOSEC) 20 MG capsule Take 20 mg by mouth daily.     ondansetron  (ZOFRAN ) 4 MG tablet Take 1 tablet (4 mg total) by mouth every 8 (eight) hours as needed for nausea or vomiting. 20 tablet 1   polyethylene glycol (MIRALAX  / GLYCOLAX ) 17 g packet Take 17 g by  mouth daily. 30 each 2   potassium chloride  SA (KLOR-CON  M) 20 MEQ tablet Take 1 tablet (20 mEq total) by mouth daily. (Patient taking differently: Take 40 mEq by mouth daily.) 30 tablet 0   pregabalin  (LYRICA ) 150 MG capsule Take 1 capsule (150 mg total) by mouth 2 (two) times daily. 60 capsule 2   triamcinolone  cream (KENALOG ) 0.1 % Apply 1 Application topically 2 (two) times daily. 30 g 0   Ubrogepant  (UBRELVY ) 100 MG TABS Take 1 tablet (100 mg total) by mouth as needed (for headaches). May repeat a dose in 2 hours if needed. Max dose 2 pills in 24 hours 16 tablet 6   vitamin B-12 (CYANOCOBALAMIN ) 100 MCG tablet Take 100 mcg by mouth daily.     benzonatate  (TESSALON ) 200 MG capsule Take 1 capsule (200 mg total) by mouth 3 (three) times daily as needed for cough. 20 capsule 0   methylPREDNISolone  (MEDROL  DOSEPAK) 4 MG TBPK tablet Take as package instructions. 1 each 0   tiZANidine  (ZANAFLEX ) 4 MG tablet Take 1 tablet (4 mg total) by mouth every 6 (six) hours as needed for muscle spasms. 30 tablet 0   traMADol  (ULTRAM ) 50 MG tablet Take 1 tablet (50 mg total) by mouth every 6 (six) hours as needed. 15 tablet 0   amLODipine  (NORVASC ) 10 MG tablet Take 1 tablet (10 mg total) by mouth daily. 90 tablet 3   metoprolol  tartrate (LOPRESSOR ) 25 MG tablet Take 1 tablet (25 mg total) by mouth 2 (two) times daily. TAKE EXTRA HALF TABLET  AS NEEDED (Patient taking differently: Take 25 mg by mouth 2 (two) times daily.) 180 tablet 2   No facility-administered medications prior to visit.    Allergies  Allergen Reactions   Latex Rash and Other (See Comments)    Possible reaction to latex gloves per patient   Meclizine Other (See Comments)    Other reaction(s): Abdominal Pain, Other    Review of Systems  Constitutional:  Negative for chills and fever.  HENT:  Negative for congestion and postnasal drip.   Eyes:  Negative for pain and discharge.  Respiratory:  Positive for cough, shortness of breath and  wheezing (Intermittent).   Cardiovascular:  Negative for chest pain and palpitations.  Gastrointestinal:  Negative for diarrhea, nausea and vomiting.  Endocrine: Negative for polydipsia and polyuria.  Genitourinary:  Negative for dysuria and hematuria.  Musculoskeletal:  Positive for arthralgias (Bilateral knees) and back pain. Negative for neck pain and neck stiffness.  Skin:  Negative for rash.  Neurological:  Negative for dizziness and weakness.  Psychiatric/Behavioral:  Negative for agitation and behavioral problems.        Objective:     Physical Exam Vitals reviewed.  Constitutional:      General: He is not in acute distress.    Appearance: He is not diaphoretic.  HENT:     Head: Normocephalic and atraumatic.     Nose: No congestion.  Mouth/Throat:     Mouth: Mucous membranes are moist.   Eyes:     General: No scleral icterus.    Extraocular Movements: Extraocular movements intact.    Cardiovascular:     Rate and Rhythm: Normal rate and regular rhythm.     Heart sounds: Normal heart sounds. No murmur heard. Pulmonary:     Breath sounds: No wheezing or rales.   Musculoskeletal:     Cervical back: Neck supple. No tenderness.     Lumbar back: Tenderness present. Decreased range of motion.     Right lower leg: No edema.     Left lower leg: No edema.   Skin:    General: Skin is warm.     Findings: No rash.   Neurological:     General: No focal deficit present.     Mental Status: He is alert and oriented to person, place, and time.   Psychiatric:        Mood and Affect: Mood normal.        Behavior: Behavior normal.     BP 132/84   Pulse 60   Ht 5' 7 (1.702 m)   Wt 242 lb 9.6 oz (110 kg)   SpO2 96%   BMI 38.00 kg/m  Wt Readings from Last 3 Encounters:  01/01/24 242 lb 9.6 oz (110 kg)  12/13/23 236 lb 4 oz (107.2 kg)  11/30/23 234 lb (106.1 kg)        Assessment & Plan:   Problem List Items Addressed This Visit       Respiratory    Chronic bronchitis (HCC)   Recently completed azithromycin  and Medrol  dose pack Albuterol  inhaler as needed for dyspnea or wheezing Guaifenesin -codeine  syrup as needed for cough If frequent need for albuterol  inhaler, will need maintenance inhaler        Musculoskeletal and Integument   Degeneration of intervertebral disc of lumbar region with discogenic back pain and lower extremity pain   Chronic low back pain, with radicular symptoms to RLE Previous x-ray of the lumbar spine reviewed On Lyrica  150 mg BID and Cymbalta , can help with neuropathic symptoms Tizanidine  as needed for muscle spasms Unable to take oral NSAIDs Tylenol  arthritis as needed for mild to moderate pain, tramadol  as needed for severe pain Avoid heavy lifting and frequent bending      Relevant Medications   tiZANidine  (ZANAFLEX ) 4 MG tablet   traMADol  (ULTRAM ) 50 MG tablet     Other   Chronic pain of both knees - Primary   Likely due to OA of knee and radicular symptoms from lumbar DDD Tylenol  arthritis as needed for mild to moderate pain, tramadol  as needed for severe pain Check x-ray of bilateral knee      Relevant Medications   tiZANidine  (ZANAFLEX ) 4 MG tablet   traMADol  (ULTRAM ) 50 MG tablet   Other Relevant Orders   DG Knee Complete 4 Views Right   DG Knee Complete 4 Views Left   Other Visit Diagnoses       Subacute cough       Relevant Medications   benzonatate  (TESSALON ) 200 MG capsule        Meds ordered this encounter  Medications   benzonatate  (TESSALON ) 200 MG capsule    Sig: Take 1 capsule (200 mg total) by mouth 2 (two) times daily as needed for cough.    Dispense:  20 capsule    Refill:  0   tiZANidine  (ZANAFLEX ) 4 MG tablet  Sig: Take 1 tablet (4 mg total) by mouth every 12 (twelve) hours as needed for muscle spasms.    Dispense:  30 tablet    Refill:  0   traMADol  (ULTRAM ) 50 MG tablet    Sig: Take 1 tablet (50 mg total) by mouth every 12 (twelve) hours as needed.     Dispense:  20 tablet    Refill:  0     Sheryle Vice Alyssa Backbone, MD

## 2024-01-01 NOTE — Patient Instructions (Signed)
 Please take Tylenol  arthritis for mild-moderate knee pain. Please take Tramadol  for severe pain only.  Please take Tizanidine  as needed for muscle spasms.  Please get x-ray of knees done at [facility].  Please take Tessalon  as needed for cough.

## 2024-01-03 ENCOUNTER — Other Ambulatory Visit: Payer: Self-pay | Admitting: Cardiovascular Disease

## 2024-01-03 NOTE — Assessment & Plan Note (Signed)
 Likely due to OA of knee and radicular symptoms from lumbar DDD Tylenol  arthritis as needed for mild to moderate pain, tramadol  as needed for severe pain Check x-ray of bilateral knee

## 2024-01-03 NOTE — Assessment & Plan Note (Signed)
 Recently completed azithromycin  and Medrol  dose pack Albuterol  inhaler as needed for dyspnea or wheezing Guaifenesin -codeine  syrup as needed for cough If frequent need for albuterol  inhaler, will need maintenance inhaler

## 2024-01-03 NOTE — Assessment & Plan Note (Signed)
 Chronic low back pain, with radicular symptoms to RLE Previous x-ray of the lumbar spine reviewed On Lyrica  150 mg BID and Cymbalta , can help with neuropathic symptoms Tizanidine  as needed for muscle spasms Unable to take oral NSAIDs Tylenol  arthritis as needed for mild to moderate pain, tramadol  as needed for severe pain Avoid heavy lifting and frequent bending

## 2024-01-09 DIAGNOSIS — L0291 Cutaneous abscess, unspecified: Secondary | ICD-10-CM | POA: Diagnosis not present

## 2024-01-09 DIAGNOSIS — C61 Malignant neoplasm of prostate: Secondary | ICD-10-CM | POA: Diagnosis not present

## 2024-01-09 DIAGNOSIS — K581 Irritable bowel syndrome with constipation: Secondary | ICD-10-CM | POA: Diagnosis not present

## 2024-01-13 ENCOUNTER — Other Ambulatory Visit: Payer: Self-pay

## 2024-01-13 ENCOUNTER — Ambulatory Visit (INDEPENDENT_AMBULATORY_CARE_PROVIDER_SITE_OTHER)

## 2024-01-13 ENCOUNTER — Ambulatory Visit
Admission: EM | Admit: 2024-01-13 | Discharge: 2024-01-13 | Disposition: A | Discharge status: Home / Self Care | Attending: Internal Medicine | Admitting: Internal Medicine

## 2024-01-13 DIAGNOSIS — T50905A Adverse effect of unspecified drugs, medicaments and biological substances, initial encounter: Secondary | ICD-10-CM | POA: Diagnosis not present

## 2024-01-13 DIAGNOSIS — J209 Acute bronchitis, unspecified: Secondary | ICD-10-CM | POA: Diagnosis not present

## 2024-01-13 DIAGNOSIS — R053 Chronic cough: Secondary | ICD-10-CM | POA: Insufficient documentation

## 2024-01-13 DIAGNOSIS — Z87891 Personal history of nicotine dependence: Secondary | ICD-10-CM | POA: Insufficient documentation

## 2024-01-13 DIAGNOSIS — R062 Wheezing: Secondary | ICD-10-CM | POA: Diagnosis not present

## 2024-01-13 DIAGNOSIS — R0601 Orthopnea: Secondary | ICD-10-CM | POA: Diagnosis not present

## 2024-01-13 DIAGNOSIS — M7989 Other specified soft tissue disorders: Secondary | ICD-10-CM | POA: Diagnosis not present

## 2024-01-13 DIAGNOSIS — R0602 Shortness of breath: Secondary | ICD-10-CM | POA: Diagnosis not present

## 2024-01-13 MED ORDER — IPRATROPIUM-ALBUTEROL 0.5-2.5 (3) MG/3ML IN SOLN
3.0000 mL | Freq: Once | RESPIRATORY_TRACT | Status: AC
  Administered 2024-01-13: 3 mL via RESPIRATORY_TRACT

## 2024-01-13 MED ORDER — PREGABALIN 50 MG PO CAPS: ORAL_CAPSULE | ORAL | 0 refills | Status: DC

## 2024-01-13 MED ORDER — DEXAMETHASONE SODIUM PHOSPHATE 10 MG/ML IJ SOLN
10.0000 mg | Freq: Once | INTRAMUSCULAR | Status: AC
  Administered 2024-01-13: 10 mg via INTRAMUSCULAR

## 2024-01-13 NOTE — ED Provider Notes (Incomplete)
 [facility]    CSN: [id number] Arrival date & time: 01/13/24  1212      History   Chief Complaint No chief complaint on file.   HPI Steven [name] is a 73 y.o. male.   Steven [name] is a 73 y.o. male presenting for chief complaint of cough, shortness of breath, leg swelling, and weight gain that started 2 months ago. Symptoms have worsened in the last 3-4 weeks.  He has noticed increased wheezing and shortness of breath to the chest in the last 5 to 6 days.  He additionally states he has had new and worsening bilateral leg swelling and has gained a significant amount of weight in the last 6 months.  He estimates he has gained 5 pounds per week in the last 6 months.  States most of his symptoms started after he began taking pregabalin  3 to 4 months ago as prescribed by his PCP for chronic pain.    He reports occasional shortness of breath with laying flat when he goes to sleep at night, this has not worsened in the last 3 to 4 weeks.  History of atrial fibrillation/atrial flutter, takes Eliquis  without missed doses.  He takes Lasix  20 mg daily for leg swelling, denies history of congestive heart failure.  States he tries to elevate his legs and wear compression stockings to help with leg swelling with some relief.   Former smoker, quit 5 years ago.  Denies history of COPD/asthma.  Denies recent intake of foods high in salt.  Denies recent fever, chills, nausea, vomiting, chest pain, heart palpitations, dizziness, headaches, visual disturbance, and rashes to the legs or body.  He read online that pregabalin  can cause weight gain and leg swelling so he stopped taking this medication 2 days ago.  He is using albuterol  inhaler for shortness of breath with minimal relief at home.    Past Medical History:  Diagnosis Date  . Anxiety   . Arthritis   . Atrial fibrillation (HCC)   . Atrial fibrillation (HCC)   . Atrial flutter (HCC)   . BPH (benign prostatic hyperplasia)   . Chest  pain   . Dizziness   . Dyspnea    laying down occ  . Dysrhythmia   . Fracture 08/17/2015   MULTIPLE RIB FRACTURES     FROM FALL   . GERD (gastroesophageal reflux disease)   . Hemorrhoids   . History of kidney stones    noted on CT scan  . History of radiation therapy 08/22/11-10/13/11   prostate  . Hyperlipemia   . Hypertension   . Hyperthyroidism   . IBS (irritable bowel syndrome)   . Insomnia   . Light headedness   . Migraine   . Numbness and tingling in left arm   . Numbness and tingling of both legs   . Open fracture of left elbow 08/18/2015  . Pre-diabetes   . Prostate cancer [facility])    prostate s/p radiation Mar 2013  . Rib fractures 08/17/2015  . Tinnitus     Patient Active Problem List   Diagnosis Date Noted  . Chronic pain of both knees 01/01/2024  . Degeneration of intervertebral disc of lumbar region with discogenic back pain and lower extremity pain 01/01/2024  . Chronic bronchitis (HCC) 11/30/2023  . Chronic sinusitis 11/30/2023  . Elevated PSA 11/15/2023  . Headache with neurologic deficit 07/12/2023  . Contact dermatitis 06/27/2023  . Low back pain 06/27/2023  . Upper respiratory infection 06/07/2023  .  Inflammatory arthritis 05/23/2023  . Pain of left hip 05/11/2023  . Migraine, unspecified, not intractable, without status migrainosus 05/08/2023  . Depression with anxiety 04/19/2023  . Mixed hyperlipidemia 03/14/2023  . Prediabetes 03/14/2023  . Diffuse pain 02/19/2023  . Hypertensive urgency 11/24/2022  . Leukopenia 11/24/2022  . SVT (supraventricular tachycardia) (HCC) 11/24/2022  . Orthostatic hypotension 11/23/2022  . Chest tightness 08/22/2022  . Hypokalemia 08/21/2022  . Calculus of gallbladder without cholecystitis without obstruction 08/09/2022  . 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 Rash and nonspecific skin eruption 05/10/2022  . Dysphagia 05/10/2022  . Fibromyalgia 07/07/2020  . Paresthesia of upper limb  03/04/2020  . Carcinoma of prostate (HCC) 02/17/2020  . Subacute thyroiditis 01/07/2020  . Personal history of malignant neoplasm of prostate 12/12/2019  . Benign prostatic hyperplasia with urinary obstruction 12/12/2019  . Weak urinary stream 12/12/2019  . Hyperthyroidism 11/24/2019  . Unintentional weight loss 10/21/2019  . IBS (irritable bowel syndrome) 10/21/2019  . Dizzy spells 09/20/2019  . Intractable chronic migraine without aura 09/20/2019  . Sleep apnea 09/20/2019  . Psychophysiologic insomnia 09/20/2019  . Rectal pain 08/21/2019  . Nausea without vomiting 05/30/2019  . Paroxysmal atrial flutter (HCC) 04/17/2019  . Chronic atrial fibrillation (HCC) 03/02/2019  . HTN (hypertension) 03/02/2019  . Constipation 08/02/2017  . Hemorrhoids 08/02/2017  . Fall 08/18/2015  . Radiation proctitis 01/30/2013  . Rectal bleeding 12/10/2012  . Obese 05/30/2012  . Dyspnea 05/30/2012  . Elevated lipids 05/30/2012  . Palpitations 05/30/2012  . Malignant neoplasm of prostate (HCC) 08/13/2011    Past Surgical History:  Procedure Laterality Date  . BOTOX  INJECTION N/A 01/14/2020   Procedure: INJECTION OF BOTOX  INTO ANAL SPHINCTER;  Surgeon: Teresa Lonni HERO, MD;  Location: [facility];  Service: General;  Laterality: N/A;  . COLONOSCOPY N/A 12/19/2012   MFM:Mzrujo 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 Normal appearing colon  . COLONOSCOPY WITH PROPOFOL  N/A 10/23/2019   Procedure: COLONOSCOPY WITH PROPOFOL ;  Surgeon: Shaaron Lamar HERO, MD; external and grade 2 internal hemorrhoids, abnormal rectal blood vessels consistent with radiation proctitis s/p APC therapy, otherwise normal exam.  . EVALUATION UNDER ANESTHESIA WITH ANAL FISTULECTOMY N/A 01/14/2020   Procedure: ANORECTAL EXAM UNDER ANESTHESIA;  Surgeon: Teresa Lonni HERO, MD;  Location: [facility];  Service: General;  Laterality: N/A;  . HOT HEMOSTASIS  10/23/2019   Procedure: HOT  HEMOSTASIS (ARGON PLASMA COAGULATION/BICAP);  Surgeon: Shaaron Lamar HERO, MD;  Location: [facility];  Service: Endoscopy;;  apc rectal proctitis    . KIDNEY SURGERY  1982   kidney tube collapse repair  . PROSTATE BIOPSY N/A 11/15/2023   Procedure: BIOPSY, PROSTATE;  Surgeon: Sherrilee Belvie CROME, MD;  Location: [facility];  Service: Urology;  Laterality: N/A;  . PROSTATE BIOPSY N/A 11/15/2023   Procedure: BIOPSY, PROSTATE, RECTAL APPROACH, WITH US  GUIDANCE;  Surgeon: Sherrilee Belvie CROME, MD;  Location: [facility];  Service: Urology;  Laterality: N/A;  . RADIOACTIVE SEED IMPLANT     Prostate  . SVT ABLATION N/A 12/12/2022   Procedure: SVT ABLATION;  Surgeon: Waddell Danelle ORN, MD;  Location: [facility] INVASIVE CV LAB;  Service: Cardiovascular;  Laterality: N/A;  . TRANSRECTAL ULTRASOUND N/A 11/15/2023   Procedure: ULTRASOUND, RECTAL APPROACH;  Surgeon: Sherrilee Belvie CROME, MD;  Location: [facility];  Service: Urology;  Laterality: N/A;       Home Medications    Prior to Admission medications   Medication Sig Start Date End Date Taking?  Authorizing Provider  acetaminophen  (TYLENOL ) 500 MG tablet Take 1,000 mg by mouth every 8 (eight) hours as needed for moderate pain (pain score 4-6).    [provider]  albuterol  (VENTOLIN  HFA) 108 (90 Base) MCG/ACT inhaler Inhale 2 puffs into the lungs every 4 (four) hours as needed. 12/13/23   Del Orbe Polanco, Iliana, FNP  alfuzosin  (UROXATRAL ) 10 MG 24 hr tablet Take 1 tablet (10 mg total) by mouth daily with breakfast. 04/11/23   [name], Belvie CROME, MD  amLODipine  (NORVASC ) 10 MG tablet Take 1 tablet (10 mg total) by mouth daily. 04/27/23 11/12/23  Nishan, Peter C, MD  azelastine  (ASTELIN ) 0.1 % nasal spray Place 1 spray into both nostrils 2 (two) times daily. Use in each nostril as directed Patient taking differently: Place 1 spray into both nostrils 2 (two) times daily as needed (Congestion). Use in each nostril as directed 09/30/23   Stuart Vernell Norris, PA-C   Azelastine -Fluticasone  137-50 MCG/ACT SUSP Place 1 spray into the nose every 12 (twelve) hours. 01/24/23   Del Orbe Polanco, Iliana, FNP  benzonatate  (TESSALON ) 200 MG capsule Take 1 capsule (200 mg total) by mouth 2 (two) times daily as needed for cough. 01/01/24   Tobie Suzzane POUR, MD  Cholecalciferol (VITAMIN D3) 125 MCG (5000 UT) CAPS Take 5,000 Units by mouth daily.    [provider]  cloNIDine  (CATAPRES ) 0.1 MG tablet Take 1 tablet (0.1 mg total) by mouth 2 (two) times daily as needed (SBP>160). 11/27/22   Maree, Pratik D, DO  DULoxetine  (CYMBALTA ) 60 MG capsule Take 1 capsule (60 mg total) by mouth daily. 07/12/23   Onita Duos, MD  ELIQUIS  5 MG TABS tablet TAKE 1 TABLET(5 MG) BY MOUTH TWICE DAILY 11/26/23   Waddell Danelle ORN, MD  ezetimibe  (ZETIA ) 10 MG tablet TAKE 1 TABLET(10 MG) BY MOUTH DAILY 12/24/23   Nida, Gebreselassie W, MD  furosemide  (LASIX ) 20 MG tablet Take 1 tablet (20 mg total) by mouth daily. Take with or after meal. 10/30/23   Waddell Danelle ORN, MD  Galcanezumab -gnlm (EMGALITY ) 120 MG/ML SOAJ Inject 1 Pen into the skin every 30 (thirty) days. 09/25/22   Rush Nest, MD  guaiFENesin -codeine  100-10 MG/5ML syrup Take 5 mLs by mouth 3 (three) times daily as needed for cough. 11/30/23   Tobie Suzzane POUR, MD  hydrOXYzine  (ATARAX ) 10 MG tablet Take 1 tablet (10 mg total) by mouth 3 (three) times daily as needed for anxiety. 11/26/22   Maree, Pratik D, DO  hyoscyamine  (LEVBID ) 0.375 MG 12 hr tablet Take 0.375 mg by mouth every 12 (twelve) hours as needed for cramping. 01/24/23   [provider]  loratadine  (CLARITIN ) 10 MG tablet Take 1 tablet (10 mg total) by mouth daily. Patient taking differently: Take 10 mg by mouth daily as needed for allergies. 01/24/23   Del Orbe Polanco, Iliana, FNP  losartan  (COZAAR ) 100 MG tablet Take 100 mg by mouth daily.    [provider]  methimazole  (TAPAZOLE ) 5 MG tablet Take 0.5 tablets (2.5 mg total) by mouth daily. 07/24/23   Nida, Gebreselassie  W, MD  metoprolol  tartrate (LOPRESSOR ) 25 MG tablet TAKE 1 TABLET(25 MG) BY MOUTH TWICE DAILY 01/04/24   Nishan, Peter C, MD  mirtazapine  (REMERON ) 15 MG tablet TAKE 1 TABLET(15 MG) BY MOUTH AT BEDTIME 11/15/23   Del Orbe Polanco, Iliana, FNP  NURTEC 75 MG TBDP Take 75 mg by mouth daily as needed (migraine). 08/14/22   [provider]  omeprazole  (PRILOSEC) 20 MG  capsule Take 20 mg by mouth daily. 06/12/23   [provider]  ondansetron  (ZOFRAN ) 4 MG tablet Take 1 tablet (4 mg total) by mouth every 8 (eight) hours as needed for nausea or vomiting. 03/16/23   Del Wilhelmena Falter, Iliana, FNP  polyethylene glycol (MIRALAX  / GLYCOLAX ) 17 g packet Take 17 g by mouth daily. 08/23/22   Ricky Fines, MD  potassium chloride  SA (KLOR-CON  M) 20 MEQ tablet Take 1 tablet (20 mEq total) by mouth daily. Patient taking differently: Take 40 mEq by mouth daily. 12/04/22   Dean Clarity, MD  pregabalin  (LYRICA ) 150 MG capsule Take 1 capsule (150 mg total) by mouth 2 (two) times daily. 12/25/23   Del Orbe Polanco, Iliana, FNP  tiZANidine  (ZANAFLEX ) 4 MG tablet Take 1 tablet (4 mg total) by mouth every 12 (twelve) hours as needed for muscle spasms. 01/01/24   Tobie Suzzane POUR, MD  traMADol  (ULTRAM ) 50 MG tablet Take 1 tablet (50 mg total) by mouth every 12 (twelve) hours as needed. 01/01/24   Tobie Suzzane POUR, MD  triamcinolone  cream (KENALOG ) 0.1 % Apply 1 Application topically 2 (two) times daily. 06/27/23   Del Wilhelmena Falter Sola, FNP  Ubrogepant  (UBRELVY ) 100 MG TABS Take 1 tablet (100 mg total) by mouth as needed (for headaches). May repeat a dose in 2 hours if needed. Max dose 2 pills in 24 hours 09/25/22   Rush Nest, MD  vitamin B-12 (CYANOCOBALAMIN ) 100 MCG tablet Take 100 mcg by mouth daily.    [provider]  gabapentin  (NEURONTIN ) 300 MG capsule Take 1 capsule (300 mg total) by mouth 3 (three) times daily as needed. 02/19/23   Del Wilhelmena Falter Sola, FNP    Family History Family  History  Problem Relation Age of Onset  . Aneurysm Mother        aortic  . Hypertension Mother   . Headache Mother   . Prostate cancer Father   . Hypertension Father   . Heart failure Brother   . Colon cancer Neg Hx   . Colon polyps Neg Hx     Social History Social History   Tobacco Use  . Smoking status: Former    Current packs/day: 0.00    Average packs/day: 1 pack/day for 20.0 years (20.0 ttl pk-yrs)    Types: Cigarettes    Start date: 2000    Quit date: 2020    Years since quitting: 5.4    Passive exposure: Past  . Smokeless tobacco: Never  . Tobacco comments:    Quit smoking x 2 years    07/2015   SOMETIIMES i USE VAPOR     Smoking Cessation Classes, Agencies, Emergency planning/management officer  . Vaping status: Never Used  Substance Use Topics  . Alcohol use: Not Currently  . Drug use: Never     Allergies   Latex and Meclizine   Review of Systems Review of Systems   Physical Exam Triage Vital Signs ED Triage Vitals [01/13/24 1227]  Encounter Vitals Group     BP (!) 146/76     Girls Systolic BP Percentile      Girls Diastolic BP Percentile      Boys Systolic BP Percentile      Boys Diastolic BP Percentile      Pulse Rate 64     Resp 17     Temp 98.1 F (36.7 C)     Temp Source Oral     SpO2 96 %  Weight      Height      Head Circumference      Peak Flow      Pain Score      Pain Loc      Pain Education      Exclude from Growth Chart    No data found.  Updated Vital Signs BP (!) 146/76 (BP Location: Right Arm)   Pulse 64   Temp 98.1 F (36.7 C) (Oral)   Resp 17   SpO2 96%   Visual Acuity Right Eye Distance:   Left Eye Distance:   Bilateral Distance:    Right Eye Near:   Left Eye Near:    Bilateral Near:     Physical Exam Vitals and nursing note reviewed.  Constitutional:      Appearance: He is not ill-appearing or toxic-appearing.  HENT:     Head: Normocephalic and atraumatic.     Right Ear: Hearing, tympanic  membrane, ear canal and external ear normal.     Left Ear: Hearing, tympanic membrane, ear canal and external ear normal.     Nose: Nose normal.     Mouth/Throat:     Lips: Pink.     Mouth: Mucous membranes are moist. No injury or oral lesions.     Dentition: Normal dentition.     Tongue: No lesions.     Pharynx: Oropharynx is clear. Uvula midline. No pharyngeal swelling, oropharyngeal exudate, posterior oropharyngeal erythema, uvula swelling or postnasal drip.     Tonsils: No tonsillar exudate.   Eyes:     General: Lids are normal. Vision grossly intact. Gaze aligned appropriately.     Extraocular Movements: Extraocular movements intact.     Conjunctiva/sclera: Conjunctivae normal.   Neck:     Trachea: Trachea and phonation normal.   Cardiovascular:     Rate and Rhythm: Normal rate and regular rhythm.     Heart sounds: Normal heart sounds, S1 normal and S2 normal.  Pulmonary:     Effort: Pulmonary effort is normal. No respiratory distress.     Breath sounds: Normal breath sounds and air entry.   Musculoskeletal:     Cervical back: Neck supple.     Right lower leg: Edema (+2 pitting edema bilaterally) present.     Left lower leg: Edema (+2 pitting edema bilaterally) present.  Lymphadenopathy:     Cervical: No cervical adenopathy.   Skin:    General: Skin is warm and dry.     Capillary Refill: Capillary refill takes less than 2 seconds.     Findings: No rash.   Neurological:     General: No focal deficit present.     Mental Status: He is alert and oriented to person, place, and time. Mental status is at baseline.     Cranial Nerves: No dysarthria or facial asymmetry.   Psychiatric:        Mood and Affect: Mood normal.        Speech: Speech normal.        Behavior: Behavior normal.        Thought Content: Thought content normal.        Judgment: Judgment normal.     UC Treatments / Results  Labs (all labs ordered are listed, but only abnormal results are  displayed) Labs Reviewed - No data to display  EKG   Radiology No results found.  Procedures Procedures (including critical care time)  Medications Ordered in UC Medications  ipratropium-albuterol  (DUONEB) 0.5-2.5 (3) MG/3ML nebulizer  solution 3 mL (3 mLs Nebulization Given 01/13/24 1306)    Initial Impression / Assessment and Plan / UC Course  I have reviewed the triage vital signs and the nursing notes.  Pertinent labs & imaging results that were available during my care of the patient were reviewed by me and considered in my medical decision making (see chart for details).     Week-2 weeks 150 daily for a week 100 daily for week 50 mg daily for a week then done  [place], ER pharmacist  Question leg swelling due to new   [phone]   BNP drawn to rule out CHF  *** Final Clinical Impressions(s) / UC Diagnoses   Final diagnoses:  Shortness of breath   Discharge Instructions   None    ED Prescriptions   None    PDMP not reviewed this encounter.

## 2024-01-13 NOTE — Discharge Instructions (Addendum)
 Your chest x-ray shows findings consistent with acute bronchitis.  Continue using your albuterol  inhaler as needed 2 puffs every 4-6 hours for shortness of breath. I gave you a steroid shot in the clinic today which will help with some of the inflammation to your chest. I do not see any abnormal changes on your chest x-ray showing pneumonia or fluid on the lungs, however I will call you if the radiologist reread the chest x-ray abnormally and requires change in treatment plan.  I suspect some of your leg swelling and weight gain is due to your pregabalin . I spoke with the pharmacist who recommends tapering this medication and not discontinuing it suddenly. Take 150 mg (3  -  50 mg tablets) once daily for the next 7 days, then take 100 mg (2 tablets) daily for 7 days, then 50mg  (1 tablet) once daily for 7 days.  Tapering this medication will prevent withdrawal symptoms from this medicine.  Please continue to use compression socks and elevate your legs to reduce leg swelling.  I have drawn blood work in the clinic today to evaluate for your kidney function, heart function, etc. and we will call you if any of your blood work is abnormal.  Please schedule a follow-up appointment with your primary care provider in the next 5 to 7 days for recheck.  If you develop any new or worsening symptoms or if your symptoms do not start to improve, please return here or follow-up with your primary care provider. If your symptoms are severe, please go to the emergency room.

## 2024-01-13 NOTE — ED Triage Notes (Signed)
 Pt presents to UC for c/o wheezing and cough which worsens at night x2 months. Pt reports he was given antibiotics and prednisone  from PCP w/o relief. Pt reports swelling in both feet and ankles and gaining about 5 lbs a week and stiff pelvis and knees. Reports it started around the time he started pregabalin .

## 2024-01-14 ENCOUNTER — Ambulatory Visit: Payer: Self-pay | Admitting: Internal Medicine

## 2024-01-14 LAB — CBC
Hematocrit: 41.6 % (ref 37.5–51.0)
Hemoglobin: 13.2 g/dL (ref 13.0–17.7)
MCH: 28.4 pg (ref 26.6–33.0)
MCHC: 31.7 g/dL (ref 31.5–35.7)
MCV: 90 fL (ref 79–97)
Platelets: 213 10*3/uL (ref 150–450)
RBC: 4.65 x10E6/uL (ref 4.14–5.80)
RDW: 12.8 % (ref 11.6–15.4)
WBC: 4.1 10*3/uL (ref 3.4–10.8)

## 2024-01-14 LAB — BASIC METABOLIC PANEL WITH GFR
BUN/Creatinine Ratio: 9 — ABNORMAL LOW (ref 10–24)
BUN: 8 mg/dL (ref 8–27)
CO2: 26 mmol/L (ref 20–29)
Calcium: 9.5 mg/dL (ref 8.6–10.2)
Chloride: 105 mmol/L (ref 96–106)
Creatinine, Ser: 0.89 mg/dL (ref 0.76–1.27)
Glucose: 107 mg/dL — ABNORMAL HIGH (ref 70–99)
Potassium: 3.8 mmol/L (ref 3.5–5.2)
Sodium: 144 mmol/L (ref 134–144)
eGFR: 91 mL/min/{1.73_m2} (ref >59)

## 2024-01-14 LAB — BRAIN NATRIURETIC PEPTIDE

## 2024-01-17 ENCOUNTER — Telehealth: Payer: Self-pay | Admitting: Cardiovascular Disease

## 2024-01-17 NOTE — Telephone Encounter (Signed)
 Patient c/o Palpitations: STAT if patient c/o lightheadedness, shortness of breath, or chest pain  How long have you had palpitations/irregular HR/ Afib? Are you having the symptoms now?  Palpitations started around 10:00 am yesterday. Patient thinks it was due to being in the heat. Feels better today, but says she still feels something.  Are you currently experiencing lightheadedness, SOB or CP?  No   Do you have a history of afib (atrial fibrillation) or irregular heart rhythm?  Yes   Have you checked your BP or HR? (document readings if available):  Patient says he checked it last night and it was 138/78  Are you experiencing any other symptoms?  Headache, respiratory issues due to having bronchitis

## 2024-01-17 NOTE — Telephone Encounter (Signed)
 Spoke with pt and yesterday went to grocery store and  had a episode of heart racing had slight headache  and lightheadedness Per pt left car and had someone come and pick him up Pt not sure if episode was brought on by heat Pt also is suffering from bronchitis and is taking a inhaler for this . Per pt B/P this am was 157/84 Instructed if develops worsening S/S to go to ED for eval and tx .Will forward to Dr Nishan for review and recommendations ./cy

## 2024-01-18 NOTE — Telephone Encounter (Signed)
 Pt notified of Dr. Claiborne response and verified understanding

## 2024-01-26 ENCOUNTER — Other Ambulatory Visit: Payer: Self-pay | Admitting: "Endocrinology

## 2024-01-30 ENCOUNTER — Ambulatory Visit: Payer: Self-pay

## 2024-01-30 NOTE — Telephone Encounter (Signed)
 FYI Only or Action Required?: FYI only for provider.  Patient was last seen in primary care on 01/01/2024 by Tobie Suzzane POUR, MD.  Called Nurse Triage reporting Groin Pain.  Symptoms began several days ago.  Interventions attempted: OTC medications: boil relief ointment, Benadryl  topical lotion.  Symptoms are: 2 red bumps with swelling, itchy and painful at base of penis and groin unchanged.  Triage Disposition: See HCP Within 4 Hours (Or PCP Triage) - patient requesting male provider, scheduled by agent for Friday with Dr Tobie and advised patient to call urgent care to see if any male providers available Patient/caregiver understands and will follow disposition?: Unsure             Reason for Triage: Patient has irritation around his genital area for a week now. Scheduled and appointment to see a male doctor. He was offer appointments with his doctor tomorrow but he feels more comfortable with a male doctor. He is very uncomfortable wants to know what he can do for relief especially in the heat.   Reason for Disposition  Scrotum looks infected (e.g., draining sore, ulcer, red rash)  Answer Assessment - Initial Assessment Questions 1. LOCATION and RADIATION: Where is the pain located?      Groin/scrotum.  2. QUALITY: What does the pain feel like?  (e.g., sharp, dull, aching, burning)     Sometimes sharp and then stinging. If you mash on it you can feel the pain  3. SEVERITY: How bad is the pain?  (Scale 1-10; or mild, moderate, severe)   - MILD (1-3): doesn't interfere with normal activities    - MODERATE (4-7): interferes with normal activities (e.g., work or school) or awakens from sleep   - SEVERE (8-10): excruciating pain, unable to do any normal activities, difficulty walking     5-6/10.  4. ONSET: When did the pain start?     X 3-4 days. He started off itching in his groin/scrotal area and then noticed the 2 bumps appear.  5. PATTERN: Does it come  and go, or has it been constant since it started?     Comes and goes at certain times  6. SCROTAL APPEARANCE: What does the scrotum look like? Is there any swelling or redness?      2 red bumps (size of a dime and the other is smaller than that) one is located at base of penis and the other is lower near groin, he thinks they could be boils.  7. HERNIA: Has a doctor ever told you that you have a hernia?     No.  8. OTHER SYMPTOMS: Do you have any other symptoms? (e.g., fever, abdominal pain, vomiting, difficulty passing urine)     Patient denies any genital/groin injury, fever, difficulty passing urine, blood in urine.  Protocols used: Scrotal Pain-A-AH

## 2024-01-30 NOTE — Telephone Encounter (Signed)
 first attempt, LVM for patient to return call to [phone]   Reason for Triage: Patient has irritation around his genital area for a week now. Scheduled and appointment to see a male doctor. He was offer appointments with his doctor tomorrow but he feels more comfortable with a male doctor. He is very uncomfortable wants to know what he can do for relief especially in the heat.

## 2024-02-01 ENCOUNTER — Ambulatory Visit: Payer: Self-pay | Admitting: Internal Medicine

## 2024-02-04 ENCOUNTER — Ambulatory Visit
Admission: EM | Admit: 2024-02-04 | Discharge: 2024-02-04 | Disposition: A | Discharge status: Home / Self Care | Attending: Nurse Practitioner | Admitting: Nurse Practitioner

## 2024-02-04 ENCOUNTER — Other Ambulatory Visit: Payer: Self-pay

## 2024-02-04 ENCOUNTER — Telehealth: Payer: Self-pay

## 2024-02-04 ENCOUNTER — Encounter: Payer: Self-pay | Admitting: Emergency Medicine

## 2024-02-04 DIAGNOSIS — L739 Follicular disorder, unspecified: Secondary | ICD-10-CM

## 2024-02-04 MED ORDER — CEPHALEXIN 500 MG PO CAPS: 500.0000 mg | ORAL_CAPSULE | Freq: Four times a day (QID) | ORAL | 0 refills | Status: DC

## 2024-02-04 MED ORDER — CHLORHEXIDINE GLUCONATE 4 % EX SOLN: CUTANEOUS | 0 refills | Status: AC

## 2024-02-04 NOTE — Telephone Encounter (Signed)
 Pt had concerns about meds not present on his discharge paper. I directed him to where the information was. Pt verbalized understand that he can pick up meds otc if insurance doesn't cover the Hibiclens .

## 2024-02-04 NOTE — Discharge Instructions (Signed)
 You may take the doxycycline that you have at home.  Take the medication twice daily.  Make sure you take the medication with food and water .  If you are unable to tolerate the medication, you may begin the prescription that was prescribed today. You may take over-the-counter Tylenol  as needed for pain or discomfort. Wear underwear that allow for good airflow and prevent sweating. You may apply warm compresses to the area 3-4 times daily as needed while symptoms persist. If symptoms fail to improve with this treatment, please follow-up with your primary care physician for further evaluation. Follow-up as needed.

## 2024-02-04 NOTE — ED Triage Notes (Signed)
 Pt reports possible boil to base of penis and groin x1 week. Pt reports hx of similar and reports they usually go away on their own. Has tried benadryl  cream, boil relief ointment.   Denies any concerns for STD at this time.

## 2024-02-04 NOTE — ED Provider Notes (Signed)
 [facility]    CSN: [id number] Arrival date & time: 02/04/24  0805      History   Chief Complaint Chief Complaint  Patient presents with   Skin Problem    HPI Steven [name] is a 73 y.o. male.   The history is provided by the patient.   Patient presents for a local skin infection to his genitalia that is been present for the past week.  Patient states that he has noticed a boil to his groin area into his scrotal sac.  Patient states that the area is painful to touch.  He states that the area is approximately a quarter size.  Patient states he has used over-the-counter medication such as Benadryl  cream.  Also states that he has used triamcinolone  cream and boil relief ointment.  Patient denies drainage from the site, fever, chills, chest pain, abdominal pain, nausea, vomiting, or diarrhea.  Patient states he has had these in the past, but they usually go away on their own.  Past Medical History:  Diagnosis Date   Anxiety    Arthritis    Atrial fibrillation (HCC)    Atrial fibrillation (HCC)    Atrial flutter (HCC)    BPH (benign prostatic hyperplasia)    Chest pain    Dizziness    Dyspnea    laying down occ   Dysrhythmia    Fracture 08/17/2015   MULTIPLE RIB FRACTURES     FROM FALL    GERD (gastroesophageal reflux disease)    Hemorrhoids    History of kidney stones    noted on CT scan   History of radiation therapy 08/22/11-10/13/11   prostate   Hyperlipemia    Hypertension    Hyperthyroidism    IBS (irritable bowel syndrome)    Insomnia    Light headedness    Migraine    Numbness and tingling in left arm    Numbness and tingling of both legs    Open fracture of left elbow 08/18/2015   Pre-diabetes    Prostate cancer [facility])    prostate s/p radiation Mar 2013   Rib fractures 08/17/2015   Tinnitus     Patient Active Problem List   Diagnosis Date Noted   Chronic pain of both knees 01/01/2024   Degeneration of intervertebral disc of lumbar  region with discogenic back pain and lower extremity pain 01/01/2024   Chronic bronchitis (HCC) 11/30/2023   Chronic sinusitis 11/30/2023   Elevated PSA 11/15/2023   Headache with neurologic deficit 07/12/2023   Contact dermatitis 06/27/2023   Low back pain 06/27/2023   Upper respiratory infection 06/07/2023   Inflammatory arthritis 05/23/2023   Pain of left hip 05/11/2023   Migraine, unspecified, not intractable, without status migrainosus 05/08/2023   Depression with anxiety 04/19/2023   Mixed hyperlipidemia 03/14/2023   Prediabetes 03/14/2023   Diffuse pain 02/19/2023   Hypertensive urgency 11/24/2022   Leukopenia 11/24/2022   SVT (supraventricular tachycardia) (HCC) 11/24/2022   Orthostatic hypotension 11/23/2022   Chest tightness 08/22/2022   Hypokalemia 08/21/2022   Calculus of gallbladder without cholecystitis without obstruction 08/09/2022   Secondary adrenocortical insufficiency (HCC) 07/19/2022   Rash and nonspecific skin eruption 05/10/2022   Dysphagia 05/10/2022   Fibromyalgia 07/07/2020   Paresthesia of upper limb 03/04/2020   Carcinoma of prostate (HCC) 02/17/2020   Subacute thyroiditis 01/07/2020   Personal history of malignant neoplasm of prostate 12/12/2019   Benign prostatic hyperplasia with urinary obstruction 12/12/2019   Weak urinary stream 12/12/2019  Hyperthyroidism 11/24/2019   Unintentional weight loss 10/21/2019   IBS (irritable bowel syndrome) 10/21/2019   Dizzy spells 09/20/2019   Intractable chronic migraine without aura 09/20/2019   Sleep apnea 09/20/2019   Psychophysiologic insomnia 09/20/2019   Rectal pain 08/21/2019   Nausea without vomiting 05/30/2019   Paroxysmal atrial flutter (HCC) 04/17/2019   Chronic atrial fibrillation (HCC) 03/02/2019   HTN (hypertension) 03/02/2019   Constipation 08/02/2017   Hemorrhoids 08/02/2017   Fall 08/18/2015   Radiation proctitis 01/30/2013   Rectal bleeding 12/10/2012   Obese 05/30/2012   Dyspnea  05/30/2012   Elevated lipids 05/30/2012   Palpitations 05/30/2012   Malignant neoplasm of prostate (HCC) 08/13/2011    Past Surgical History:  Procedure Laterality Date   BOTOX  INJECTION N/A 01/14/2020   Procedure: INJECTION OF BOTOX  INTO ANAL SPHINCTER;  Surgeon: Teresa Lonni HERO, MD;  Location: [facility];  Service: General;  Laterality: N/A;   COLONOSCOPY N/A 12/19/2012   MFM:Mzrujo 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 Normal appearing colon   COLONOSCOPY WITH PROPOFOL  N/A 10/23/2019   Procedure: COLONOSCOPY WITH PROPOFOL ;  Surgeon: Shaaron Lamar HERO, MD; external and grade 2 internal hemorrhoids, abnormal rectal blood vessels consistent with radiation proctitis s/p APC therapy, otherwise normal exam.   EVALUATION UNDER ANESTHESIA WITH ANAL FISTULECTOMY N/A 01/14/2020   Procedure: ANORECTAL EXAM UNDER ANESTHESIA;  Surgeon: Teresa Lonni HERO, MD;  Location: [facility];  Service: General;  Laterality: N/A;   HOT HEMOSTASIS  10/23/2019   Procedure: HOT HEMOSTASIS (ARGON PLASMA COAGULATION/BICAP);  Surgeon: Shaaron Lamar HERO, MD;  Location: [facility];  Service: Endoscopy;;  apc rectal proctitis     KIDNEY SURGERY  1982   kidney tube collapse repair   PROSTATE BIOPSY N/A 11/15/2023   Procedure: BIOPSY, PROSTATE;  Surgeon: Sherrilee Belvie CROME, MD;  Location: [facility];  Service: Urology;  Laterality: N/A;   PROSTATE BIOPSY N/A 11/15/2023   Procedure: BIOPSY, PROSTATE, RECTAL APPROACH, WITH US  GUIDANCE;  Surgeon: Sherrilee Belvie CROME, MD;  Location: [facility];  Service: Urology;  Laterality: N/A;   RADIOACTIVE SEED IMPLANT     Prostate   SVT ABLATION N/A 12/12/2022   Procedure: SVT ABLATION;  Surgeon: Waddell Danelle ORN, MD;  Location: [facility];  Service: Cardiovascular;  Laterality: N/A;   TRANSRECTAL ULTRASOUND N/A 11/15/2023   Procedure: ULTRASOUND, RECTAL APPROACH;  Surgeon: Sherrilee Belvie CROME, MD;  Location: [facility];  Service:  Urology;  Laterality: N/A;       Home Medications    Prior to Admission medications   Medication Sig Start Date End Date Taking? Authorizing Provider  cephALEXin  (KEFLEX ) 500 MG capsule Take 1 capsule (500 mg total) by mouth 4 (four) times daily. 02/04/24  Yes Leath-Warren, Etta PARAS, NP  chlorhexidine  (HIBICLENS ) 4 % external liquid Apply a dime size amount of the solution to warm water  and cleanse the affected areas twice daily while symptoms persist. 02/04/24  Yes Leath-Warren, Etta PARAS, NP  acetaminophen  (TYLENOL ) 500 MG tablet Take 1,000 mg by mouth every 8 (eight) hours as needed for moderate pain (pain score 4-6).    [provider]  albuterol  (VENTOLIN  HFA) 108 (90 Base) MCG/ACT inhaler Inhale 2 puffs into the lungs every 4 (four) hours as needed. 12/13/23   Del Wilhelmena Lloyd Sola, FNP  alfuzosin  (UROXATRAL ) 10 MG 24 hr tablet Take 1 tablet (10 mg total) by mouth daily with breakfast. 04/11/23   [name], Belvie CROME, MD  amLODipine  (NORVASC )  10 MG tablet Take 1 tablet (10 mg total) by mouth daily. 04/27/23 11/12/23  Nishan, Peter C, MD  azelastine  (ASTELIN ) 0.1 % nasal spray Place 1 spray into both nostrils 2 (two) times daily. Use in each nostril as directed Patient taking differently: Place 1 spray into both nostrils 2 (two) times daily as needed (Congestion). Use in each nostril as directed 09/30/23   Stuart Vernell Norris, PA-C  Azelastine -Fluticasone  137-50 MCG/ACT SUSP Place 1 spray into the nose every 12 (twelve) hours. 01/24/23   Del Orbe Polanco, Iliana, FNP  benzonatate  (TESSALON ) 200 MG capsule Take 1 capsule (200 mg total) by mouth 2 (two) times daily as needed for cough. 01/01/24   Tobie Suzzane POUR, MD  Cholecalciferol (VITAMIN D3) 125 MCG (5000 UT) CAPS Take 5,000 Units by mouth daily.    [provider]  cloNIDine  (CATAPRES ) 0.1 MG tablet Take 1 tablet (0.1 mg total) by mouth 2 (two) times daily as needed (SBP>160). 11/27/22   Maree, Pratik D, DO  DULoxetine   (CYMBALTA ) 60 MG capsule Take 1 capsule (60 mg total) by mouth daily. 07/12/23   Onita Duos, MD  ELIQUIS  5 MG TABS tablet TAKE 1 TABLET(5 MG) BY MOUTH TWICE DAILY 11/26/23   Waddell Danelle ORN, MD  ezetimibe  (ZETIA ) 10 MG tablet TAKE 1 TABLET(10 MG) BY MOUTH DAILY 12/24/23   Nida, Gebreselassie W, MD  furosemide  (LASIX ) 20 MG tablet Take 1 tablet (20 mg total) by mouth daily. Take with or after meal. 10/30/23   Waddell Danelle ORN, MD  Galcanezumab -gnlm (EMGALITY ) 120 MG/ML SOAJ Inject 1 Pen into the skin every 30 (thirty) days. 09/25/22   Rush Nest, MD  guaiFENesin -codeine  100-10 MG/5ML syrup Take 5 mLs by mouth 3 (three) times daily as needed for cough. 11/30/23   Tobie Suzzane POUR, MD  hydrOXYzine  (ATARAX ) 10 MG tablet Take 1 tablet (10 mg total) by mouth 3 (three) times daily as needed for anxiety. 11/26/22   Maree, Pratik D, DO  hyoscyamine  (LEVBID ) 0.375 MG 12 hr tablet Take 0.375 mg by mouth every 12 (twelve) hours as needed for cramping. 01/24/23   [provider]  loratadine  (CLARITIN ) 10 MG tablet Take 1 tablet (10 mg total) by mouth daily. Patient taking differently: Take 10 mg by mouth daily as needed for allergies. 01/24/23   Del Orbe Polanco, Iliana, FNP  losartan  (COZAAR ) 100 MG tablet Take 100 mg by mouth daily.    [provider]  methimazole  (TAPAZOLE ) 5 MG tablet TAKE 1/2 TABLET BY MOUTH DAILY 01/28/24   Nida, Gebreselassie W, MD  metoprolol  tartrate (LOPRESSOR ) 25 MG tablet TAKE 1 TABLET(25 MG) BY MOUTH TWICE DAILY 01/04/24   Nishan, Peter C, MD  mirtazapine  (REMERON ) 15 MG tablet TAKE 1 TABLET(15 MG) BY MOUTH AT BEDTIME 11/15/23   Del Orbe Polanco, Iliana, FNP  NURTEC 75 MG TBDP Take 75 mg by mouth daily as needed (migraine). 08/14/22   [provider]  omeprazole  (PRILOSEC) 20 MG capsule Take 20 mg by mouth daily. 06/12/23   [provider]  ondansetron  (ZOFRAN ) 4 MG tablet Take 1 tablet (4 mg total) by mouth every 8 (eight) hours as needed for nausea or vomiting.  03/16/23   Del Wilhelmena Falter, Iliana, FNP  polyethylene glycol (MIRALAX  / GLYCOLAX ) 17 g packet Take 17 g by mouth daily. 08/23/22   Ricky Fines, MD  potassium chloride  SA (KLOR-CON  M) 20 MEQ tablet Take 1 tablet (20 mEq total) by mouth daily. Patient taking differently: Take 40 mEq by mouth  daily. 12/04/22   Dean Clarity, MD  pregabalin  (LYRICA ) 50 MG capsule Take 3 capsules (150 mg total) by mouth daily for 7 days, THEN 2 capsules (100 mg total) daily for 7 days, THEN 1 capsule (50 mg total) daily for 7 days. 01/13/24 02/03/24  Enedelia Dorna HERO, FNP  tiZANidine  (ZANAFLEX ) 4 MG tablet Take 1 tablet (4 mg total) by mouth every 12 (twelve) hours as needed for muscle spasms. 01/01/24   Tobie Suzzane POUR, MD  traMADol  (ULTRAM ) 50 MG tablet Take 1 tablet (50 mg total) by mouth every 12 (twelve) hours as needed. 01/01/24   Tobie Suzzane POUR, MD  triamcinolone  cream (KENALOG ) 0.1 % Apply 1 Application topically 2 (two) times daily. 06/27/23   Del Wilhelmena Lloyd Sola, FNP  Ubrogepant  (UBRELVY ) 100 MG TABS Take 1 tablet (100 mg total) by mouth as needed (for headaches). May repeat a dose in 2 hours if needed. Max dose 2 pills in 24 hours 09/25/22   Rush Nest, MD  vitamin B-12 (CYANOCOBALAMIN ) 100 MCG tablet Take 100 mcg by mouth daily.    [provider]  gabapentin  (NEURONTIN ) 300 MG capsule Take 1 capsule (300 mg total) by mouth 3 (three) times daily as needed. 02/19/23   Del Wilhelmena Lloyd Sola, FNP    Family History Family History  Problem Relation Age of Onset   Aneurysm Mother        aortic   Hypertension Mother    Headache Mother    Prostate cancer Father    Hypertension Father    Heart failure Brother    Colon cancer Neg Hx    Colon polyps Neg Hx     Social History Social History   Tobacco Use   Smoking status: Former    Current packs/day: 0.00    Average packs/day: 1 pack/day for 20.0 years (20.0 ttl pk-yrs)    Types: Cigarettes    Start date: 2000    Quit date: 2020     Years since quitting: 5.5    Passive exposure: Past   Smokeless tobacco: Never   Tobacco comments:    Quit smoking x 2 years    07/2015   SOMETIIMES i USE VAPOR     Smoking Cessation Classes, Agencies, Services & Resources Offered  Vaping Use   Vaping status: Never Used  Substance Use Topics   Alcohol use: Not Currently   Drug use: Never     Allergies   Latex and Meclizine   Review of Systems Review of Systems Per HPI  Physical Exam Triage Vital Signs ED Triage Vitals  Encounter Vitals Group     BP 02/04/24 0826 133/80     Girls Systolic BP Percentile --      Girls Diastolic BP Percentile --      Boys Systolic BP Percentile --      Boys Diastolic BP Percentile --      Pulse Rate 02/04/24 0826 63     Resp 02/04/24 0826 18     Temp 02/04/24 0826 97.8 F (36.6 C)     Temp Source 02/04/24 0826 Oral     SpO2 02/04/24 0826 95 %     Weight --      Height --      Head Circumference --      Peak Flow --      Pain Score 02/04/24 0825 2     Pain Loc --      Pain Education --      Jonette  from Growth Chart --    No data found.  Updated Vital Signs BP 133/80 (BP Location: Right Arm)   Pulse 63   Temp 97.8 F (36.6 C) (Oral)   Resp 18   SpO2 95%   Visual Acuity Right Eye Distance:   Left Eye Distance:   Bilateral Distance:    Right Eye Near:   Left Eye Near:    Bilateral Near:     Physical Exam Vitals and nursing note reviewed.  Constitutional:      General: He is not in acute distress.    Appearance: Normal appearance.  HENT:     Head: Normocephalic.  Eyes:     Extraocular Movements: Extraocular movements intact.     Pupils: Pupils are equal, round, and reactive to light.  Cardiovascular:     Rate and Rhythm: Normal rate and regular rhythm.     Pulses: Normal pulses.     Heart sounds: Normal heart sounds.  Pulmonary:     Effort: Pulmonary effort is normal. No respiratory distress.     Breath sounds: Normal breath sounds. No stridor. No wheezing,  rhonchi or rales.  Abdominal:     General: Bowel sounds are normal.     Palpations: Abdomen is soft.     Tenderness: There is no abdominal tenderness.  Musculoskeletal:     Cervical back: Normal range of motion.  Skin:    General: Skin is warm and dry.     Comments: Erythematous induration noted to the left groin region.  There is also erythematous induration noted to the left scrotal sac.  Indurations originating from the hair follicles. The areas are slightly raised and firm.  Areas are tender to palpation.  There is no oozing, fluctuance, or drainage present.  Neurological:     General: No focal deficit present.     Mental Status: He is alert and oriented to person, place, and time.  Psychiatric:        Mood and Affect: Mood normal.        Behavior: Behavior normal.      UC Treatments / Results  Labs (all labs ordered are listed, but only abnormal results are displayed) Labs Reviewed - No data to display  EKG   Radiology No results found.  Procedures Procedures (including critical care time)  Medications Ordered in UC Medications - No data to display  Initial Impression / Assessment and Plan / UC Course  I have reviewed the triage vital signs and the nursing notes.  Pertinent labs & imaging results that were available during my care of the patient were reviewed by me and considered in my medical decision making (see chart for details).  Symptoms consistent with folliculitis.  Patient reports previous prescription of doxycycline that he has at home that he would like to try first.  States that he experienced nausea with taking the doxycycline.  Keflex  500 mg prescribed in the event the patient is unable to tolerate the doxycycline.  Hibiclens  4% solution also prescribed for patient to cleanse the affected areas twice daily.  Supportive care recommendations were provided and discussed with the patient to include over-the-counter analgesics, warm compresses to the area, and  to monitor for worsening.  Patient was given follow-up precautions.  Patient was in agreement with this plan of care and verbalizes understanding.  All questions were answered.  Patient stable for discharge.  Final Clinical Impressions(s) / UC Diagnoses   Final diagnoses:  Folliculitis     Discharge Instructions  You may take the doxycycline that you have at home.  Take the medication twice daily.  Make sure you take the medication with food and water .  If you are unable to tolerate the medication, you may begin the prescription that was prescribed today. You may take over-the-counter Tylenol  as needed for pain or discomfort. Wear underwear that allow for good airflow and prevent sweating. You may apply warm compresses to the area 3-4 times daily as needed while symptoms persist. If symptoms fail to improve with this treatment, please follow-up with your primary care physician for further evaluation. Follow-up as needed.     ED Prescriptions     Medication Sig Dispense Auth. Provider   cephALEXin  (KEFLEX ) 500 MG capsule Take 1 capsule (500 mg total) by mouth 4 (four) times daily. 28 capsule Leath-Warren, Etta PARAS, NP   chlorhexidine  (HIBICLENS ) 4 % external liquid Apply a dime size amount of the solution to warm water  and cleanse the affected areas twice daily while symptoms persist. 118 mL Leath-Warren, Etta PARAS, NP      PDMP not reviewed this encounter.   Gilmer Etta PARAS, NP 02/04/24 0945

## 2024-02-18 ENCOUNTER — Telehealth: Payer: Self-pay | Admitting: Family Medicine

## 2024-02-18 NOTE — Telephone Encounter (Signed)
 FYI Only or Action Required?: Action required by provider: alled to check to the status on if pcp will restart the gabapentin  (NEURONTIN ) 300 MG capsule. An pt would a follow-up by telephone or MyChart...  Patient was last seen in primary care on 01/01/2024 by Tobie Suzzane POUR, MD.  Called Nurse Triage reporting No chief complaint on file..  Symptoms began today.  Interventions attempted: Nothing.  Symptoms are: stable.  Triage Disposition: No disposition on file.  Patient/caregiver understands and will follow disposition?:       Copied from CRM [id number]. Topic: Clinical - Medication Question >> Feb 18, 2024  2:05 PM Precious C wrote: Reason for CRM: Pt called to check to the status on if pcp will restart the gabapentin  (NEURONTIN ) 300 MG capsule. An pt would a follow-up by telephone or MyChart.[email or web address] to call back directly if there are further questions, or if these symptoms fail to improve as anticipated or worsen. [phone].

## 2024-02-19 NOTE — Telephone Encounter (Signed)
 Patient cannot take both Lyrica  and gabapentin  together, as they work similarly and may increase side effects when combined. Please let us  know which one you would prefer to continue

## 2024-02-20 NOTE — Telephone Encounter (Signed)
 Patient advised , he states he will discuss with provider tomorrow at appointment

## 2024-02-21 ENCOUNTER — Ambulatory Visit: Admitting: Family Medicine

## 2024-02-21 ENCOUNTER — Telehealth: Payer: Self-pay | Admitting: Urology

## 2024-02-21 ENCOUNTER — Encounter: Payer: Self-pay | Admitting: Family Medicine

## 2024-02-21 VITALS — BP 132/70 | HR 61 | Ht 67.0 in | Wt 234.0 lb

## 2024-02-21 DIAGNOSIS — B356 Tinea cruris: Secondary | ICD-10-CM

## 2024-02-21 DIAGNOSIS — Z122 Encounter for screening for malignant neoplasm of respiratory organs: Secondary | ICD-10-CM

## 2024-02-21 MED ORDER — GABAPENTIN 100 MG PO CAPS: 100.0000 mg | ORAL_CAPSULE | Freq: Three times a day (TID) | ORAL | 2 refills | Status: DC | PRN

## 2024-02-21 MED ORDER — KETOCONAZOLE 2 % EX CREA: 1.0000 | TOPICAL_CREAM | Freq: Every day | CUTANEOUS | 0 refills | Status: DC

## 2024-02-21 NOTE — Telephone Encounter (Signed)
 Spoke with patient. Patients states he has appointment with PCP and will address the rash with them.  Patient will call back if needed.

## 2024-02-21 NOTE — Assessment & Plan Note (Signed)
 Patient presents with an itchy, erythematous rash in the groin area consistent with tinea cruris. Initiated treatment with Ketoconazole  2% cream, to be applied topically once daily to the affected area for 2-4 weeks. Instructed patient to keep the area clean and dry, avoid tight clothing, and continue use for at least 1 week after symptom resolution to prevent recurrence. Advised to follow up if no improvement within 2 weeks or if symptoms worsen.

## 2024-02-21 NOTE — Patient Instructions (Signed)

## 2024-02-21 NOTE — Telephone Encounter (Signed)
 Patient left message he has rash and pain in groin and pelvic area. He went to UC 2 weeks ago and was given a cream and cleanser. He has an appt here in August and wants to see if he can get something called in until the appt.

## 2024-02-21 NOTE — Progress Notes (Signed)
 Established Patient Office Visit   Subjective  Patient ID: Steven [name], male    DOB: [birth year]  Age: 73 y.o. MRN: [id number]  Chief Complaint  Patient presents with   Care Management    Four month follow up    Rash    Rash with itchiness and redness along groin area    He  has a past medical history of Anxiety, Arthritis, Atrial fibrillation (HCC), Atrial fibrillation (HCC), Atrial flutter (HCC), BPH (benign prostatic hyperplasia), Chest pain, Dizziness, Dyspnea, Dysrhythmia, Fracture (08/17/2015), GERD (gastroesophageal reflux disease), Hemorrhoids, History of kidney stones, History of radiation therapy (08/22/11-10/13/11), Hyperlipemia, Hypertension, Hyperthyroidism, IBS (irritable bowel syndrome), Insomnia, Light headedness, Migraine, Numbness and tingling in left arm, Numbness and tingling of both legs, Open fracture of left elbow (08/18/2015), Pre-diabetes, Prostate cancer (HCC), Rib fractures (08/17/2015), and Tinnitus.  HPI Patient presents to the clinic for follow up. For the details of today's visit, please refer to assessment and plan.  Review of Systems  Constitutional:  Negative for chills and fever.  Cardiovascular:  Negative for chest pain.  Skin:  Positive for itching and rash.  Neurological:  Negative for dizziness and headaches.      Objective:     BP 132/70   Pulse 61   Ht 5' 7 (1.702 m)   Wt 234 lb (106.1 kg)   SpO2 93%   BMI 36.65 kg/m  BP Readings from Last 3 Encounters:  02/21/24 132/70  02/04/24 133/80  01/13/24 (!) 146/76      Physical Exam Vitals reviewed.  Constitutional:      General: He is not in acute distress.    Appearance: Normal appearance. He is not ill-appearing, toxic-appearing or diaphoretic.  HENT:     Head: Normocephalic.  Eyes:     General:        Right eye: No discharge.        Left eye: No discharge.     Conjunctiva/sclera: Conjunctivae normal.  Cardiovascular:     Rate and Rhythm: Normal rate.     Pulses: Normal  pulses.     Heart sounds: Normal heart sounds.  Pulmonary:     Effort: Pulmonary effort is normal. No respiratory distress.     Breath sounds: Normal breath sounds.  Abdominal:     General: Bowel sounds are normal.     Palpations: Abdomen is soft.     Tenderness: There is no abdominal tenderness. There is no right CVA tenderness, left CVA tenderness or guarding.  Skin:    General: Skin is warm and dry.     Capillary Refill: Capillary refill takes less than 2 seconds.     Findings: Erythema and rash present.     Comments:  erythematous rash in the groin are  Neurological:     General: No focal deficit present.     Mental Status: He is alert and oriented to person, place, and time.     Coordination: Coordination normal.     Gait: Gait normal.  Psychiatric:        Mood and Affect: Mood normal.        Behavior: Behavior normal.      No results found for any visits on 02/21/24.  The 10-year ASCVD risk score (Arnett DK, et al., 2019) is: 20.8%    Assessment & Plan:  Tinea cruris Assessment & Plan: Patient presents with an itchy, erythematous rash in the groin area consistent with tinea cruris. Initiated treatment with Ketoconazole  2% cream, to be applied  topically once daily to the affected area for 2-4 weeks. Instructed patient to keep the area clean and dry, avoid tight clothing, and continue use for at least 1 week after symptom resolution to prevent recurrence. Advised to follow up if no improvement within 2 weeks or if symptoms worsen.  Orders: -     Ketoconazole ; Apply 1 Application topically daily. For 2-4 weeks  Dispense: 30 g; Refill: 0  Screening for lung cancer -     Ambulatory Referral for Lung Cancer Scre  Other orders -     Gabapentin ; Take 1 capsule (100 mg total) by mouth 3 (three) times daily as needed.  Dispense: 90 capsule; Refill: 2    Return in about 3 months (around 05/23/2024), or if symptoms worsen or fail to improve, for chronic follow-up.   Hilario Kidd Wilhelmena Falter, FNP

## 2024-02-27 DIAGNOSIS — L728 Other follicular cysts of the skin and subcutaneous tissue: Secondary | ICD-10-CM | POA: Diagnosis not present

## 2024-02-27 DIAGNOSIS — B356 Tinea cruris: Secondary | ICD-10-CM | POA: Diagnosis not present

## 2024-02-27 DIAGNOSIS — L710 Perioral dermatitis: Secondary | ICD-10-CM | POA: Diagnosis not present

## 2024-03-03 ENCOUNTER — Other Ambulatory Visit

## 2024-03-03 DIAGNOSIS — C61 Malignant neoplasm of prostate: Secondary | ICD-10-CM | POA: Diagnosis not present

## 2024-03-04 LAB — PSA, TOTAL AND FREE
PSA, Free Pct: 5.7 %
PSA, Free: 0.29 ng/mL
Prostate Specific Ag, Serum: 5.1 ng/mL — ABNORMAL HIGH (ref 0.0–4.0)

## 2024-03-11 ENCOUNTER — Ambulatory Visit: Payer: Self-pay | Admitting: Urology

## 2024-03-14 ENCOUNTER — Telehealth: Payer: Self-pay

## 2024-03-14 NOTE — Telephone Encounter (Signed)
 Copied from CRM [id number]. Topic: Referral - Status >> Mar 14, 2024 10:51 AM Ivette P wrote: Reason for CRM: Pt calling in about lung screen referral. Case was closed due to no response, pt would like to know if referral can be resent.    Pls fllw up with pt.pt would like to go to [facility].

## 2024-03-17 ENCOUNTER — Other Ambulatory Visit: Payer: Self-pay | Admitting: Family Medicine

## 2024-03-17 ENCOUNTER — Ambulatory Visit: Admitting: Urology

## 2024-03-17 ENCOUNTER — Encounter: Payer: Self-pay | Admitting: Urology

## 2024-03-17 VITALS — BP 167/69 | HR 70

## 2024-03-17 DIAGNOSIS — R3912 Poor urinary stream: Secondary | ICD-10-CM

## 2024-03-17 DIAGNOSIS — C61 Malignant neoplasm of prostate: Secondary | ICD-10-CM | POA: Diagnosis not present

## 2024-03-17 DIAGNOSIS — N138 Other obstructive and reflux uropathy: Secondary | ICD-10-CM | POA: Diagnosis not present

## 2024-03-17 DIAGNOSIS — N401 Enlarged prostate with lower urinary tract symptoms: Secondary | ICD-10-CM | POA: Diagnosis not present

## 2024-03-17 DIAGNOSIS — Z122 Encounter for screening for malignant neoplasm of respiratory organs: Secondary | ICD-10-CM

## 2024-03-17 MED ORDER — ALFUZOSIN HCL ER 10 MG PO TB24
10.0000 mg | ORAL_TABLET | Freq: Every day | ORAL | 3 refills | Status: DC
Start: 2024-03-17 — End: 2024-06-10

## 2024-03-17 NOTE — Progress Notes (Signed)
 03/17/2024 3:03 PM   [place] JONETTA Redman [birth year] [id number]  Referring provider: Terry Wilhelmena Lloyd Hilario, FNP [phone] [address] [place],  [place] [address]  Followup prostate cancer.   HPI: Mr Cordova is a 73yo here for followup for prostate cancer and BPh with nocturia. PSA increased to 5.1 from 4.5. He is active surveillance for recurrent prostate cancer. IPSS 12 QOl 1 on uroxatral  10mg  at bedtime. Nocturia 0-1x depending on fluid consumption. Urine stream strong. NO straining to urinate. No other complaints today   PMH: Past Medical History:  Diagnosis Date   Anxiety    Arthritis    Atrial fibrillation (HCC)    Atrial fibrillation (HCC)    Atrial flutter (HCC)    BPH (benign prostatic hyperplasia)    Chest pain    Dizziness    Dyspnea    laying down occ   Dysrhythmia    Fracture 08/17/2015   MULTIPLE RIB FRACTURES     FROM FALL    GERD (gastroesophageal reflux disease)    Hemorrhoids    History of kidney stones    noted on CT scan   History of radiation therapy 08/22/11-10/13/11   prostate   Hyperlipemia    Hypertension    Hyperthyroidism    IBS (irritable bowel syndrome)    Insomnia    Light headedness    Migraine    Numbness and tingling in left arm    Numbness and tingling of both legs    Open fracture of left elbow 08/18/2015   Pre-diabetes    Prostate cancer [facility])    prostate s/p radiation Mar 2013   Rib fractures 08/17/2015   Tinnitus     Surgical History: Past Surgical History:  Procedure Laterality Date   BOTOX  INJECTION N/A 01/14/2020   Procedure: INJECTION OF BOTOX  INTO ANAL SPHINCTER;  Surgeon: Teresa Lonni HERO, MD;  Location: [facility];  Service: General;  Laterality: N/A;   COLONOSCOPY N/A 12/19/2012   MFM:Mzrujo 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 Normal appearing colon   COLONOSCOPY WITH PROPOFOL  N/A 10/23/2019   Procedure: COLONOSCOPY WITH PROPOFOL ;  Surgeon: Shaaron Lamar HERO, MD; external and grade 2 internal hemorrhoids, abnormal rectal blood vessels consistent with radiation proctitis s/p APC therapy, otherwise normal exam.   EVALUATION UNDER ANESTHESIA WITH ANAL FISTULECTOMY N/A 01/14/2020   Procedure: ANORECTAL EXAM UNDER ANESTHESIA;  Surgeon: Teresa Lonni HERO, MD;  Location: [facility];  Service: General;  Laterality: N/A;   HOT HEMOSTASIS  10/23/2019   Procedure: HOT HEMOSTASIS (ARGON PLASMA COAGULATION/BICAP);  Surgeon: Shaaron Lamar HERO, MD;  Location: [facility];  Service: Endoscopy;;  apc rectal proctitis     KIDNEY SURGERY  1982   kidney tube collapse repair   PROSTATE BIOPSY N/A 11/15/2023   Procedure: BIOPSY, PROSTATE;  Surgeon: Sherrilee Belvie CROME, MD;  Location: [facility];  Service: Urology;  Laterality: N/A;   PROSTATE BIOPSY N/A 11/15/2023   Procedure: BIOPSY, PROSTATE, RECTAL APPROACH, WITH US  GUIDANCE;  Surgeon: Sherrilee Belvie CROME, MD;  Location: [facility];  Service: Urology;  Laterality: N/A;   RADIOACTIVE SEED IMPLANT     Prostate   SVT ABLATION N/A 12/12/2022   Procedure: SVT ABLATION;  Surgeon: Waddell Danelle ORN, MD;  Location[facility] INVASIVE CV [facility];  Service: Cardiovascular;  Laterality: N/A;   TRANSRECTAL ULTRASOUND N/A 11/15/2023   Procedure: ULTRASOUND, RECTAL APPROACH;  Surgeon: Sherrilee Belvie CROME, MD;  Location: [facility];  Service: Urology;  Laterality: N/A;    Home Medications:  Allergies as of 03/17/2024       Reactions   Latex Rash, Other (See Comments)   Possible reaction to latex gloves per patient   Meclizine Other (See Comments)   Other reaction(s): Abdominal Pain, Other        Medication List        Accurate as of March 17, 2024  3:03 PM. If you have any questions, ask your nurse or doctor.          acetaminophen  500 MG tablet Commonly known as: TYLENOL  Take 1,000 mg by mouth every 8 (eight) hours as needed for moderate pain (pain score 4-6).   albuterol  108 (90 Base) MCG/ACT inhaler Commonly known as: VENTOLIN   HFA Inhale 2 puffs into the lungs every 4 (four) hours as needed.   alfuzosin  10 MG 24 hr tablet Commonly known as: UROXATRAL  Take 1 tablet (10 mg total) by mouth daily with breakfast.   amLODipine  10 MG tablet Commonly known as: NORVASC  Take 1 tablet (10 mg total) by mouth daily.   azelastine  0.1 % nasal spray Commonly known as: ASTELIN  Place 1 spray into both nostrils 2 (two) times daily. Use in each nostril as directed What changed:  when to take this reasons to take this   Azelastine -Fluticasone  137-50 MCG/ACT Susp Place 1 spray into the nose every 12 (twelve) hours.   benzonatate  200 MG capsule Commonly known as: TESSALON  Take 1 capsule (200 mg total) by mouth 2 (two) times daily as needed for cough.   cephALEXin  500 MG capsule Commonly known as: KEFLEX  Take 1 capsule (500 mg total) by mouth 4 (four) times daily.   chlorhexidine  4 % external liquid Commonly known as: Hibiclens  Apply a dime size amount of the solution to warm water  and cleanse the affected areas twice daily while symptoms persist.   cloNIDine  0.1 MG tablet Commonly known as: CATAPRES  Take 1 tablet (0.1 mg total) by mouth 2 (two) times daily as needed (SBP>160).   DULoxetine  60 MG capsule Commonly known as: Cymbalta  Take 1 capsule (60 mg total) by mouth daily.   Eliquis  5 MG Tabs tablet Generic drug: apixaban  TAKE 1 TABLET(5 MG) BY MOUTH TWICE DAILY   Emgality  120 MG/ML Soaj Generic drug: Galcanezumab -gnlm Inject 1 Pen into the skin every 30 (thirty) days.   ezetimibe  10 MG tablet Commonly known as: ZETIA  TAKE 1 TABLET(10 MG) BY MOUTH DAILY   furosemide  20 MG tablet Commonly known as: LASIX  Take 1 tablet (20 mg total) by mouth daily. Take with or after meal.   gabapentin  100 MG capsule Commonly known as: NEURONTIN  Take 1 capsule (100 mg total) by mouth 3 (three) times daily as needed.   guaiFENesin -codeine  100-10 MG/5ML syrup Take 5 mLs by mouth 3 (three) times daily as needed for  cough.   hydrOXYzine  10 MG tablet Commonly known as: ATARAX  Take 1 tablet (10 mg total) by mouth 3 (three) times daily as needed for anxiety.   hyoscyamine  0.375 MG 12 hr tablet Commonly known as: LEVBID  Take 0.375 mg by mouth every 12 (twelve) hours as needed for cramping.   ketoconazole  2 % cream Commonly known as: NIZORAL  Apply 1 Application topically daily. For 2-4 weeks   loratadine  10 MG tablet Commonly known as: CLARITIN  Take 1 tablet (10 mg total) by mouth daily. What changed:  when to take this reasons to take this   losartan  100 MG tablet Commonly known as: COZAAR  Take 100 mg by mouth daily.  methimazole  5 MG tablet Commonly known as: TAPAZOLE  TAKE 1/2 TABLET BY MOUTH DAILY   metoprolol  tartrate 25 MG tablet Commonly known as: LOPRESSOR  TAKE 1 TABLET(25 MG) BY MOUTH TWICE DAILY   mirtazapine  15 MG tablet Commonly known as: REMERON  TAKE 1 TABLET(15 MG) BY MOUTH AT BEDTIME   Nurtec 75 MG Tbdp Generic drug: Rimegepant Sulfate Take 75 mg by mouth daily as needed (migraine).   omeprazole  20 MG capsule Commonly known as: PRILOSEC Take 20 mg by mouth daily.   ondansetron  4 MG tablet Commonly known as: Zofran  Take 1 tablet (4 mg total) by mouth every 8 (eight) hours as needed for nausea or vomiting.   polyethylene glycol 17 g packet Commonly known as: MIRALAX  / GLYCOLAX  Take 17 g by mouth daily.   potassium chloride  SA 20 MEQ tablet Commonly known as: KLOR-CON  M Take 1 tablet (20 mEq total) by mouth daily. What changed: how much to take   tiZANidine  4 MG tablet Commonly known as: Zanaflex  Take 1 tablet (4 mg total) by mouth every 12 (twelve) hours as needed for muscle spasms.   traMADol  50 MG tablet Commonly known as: Ultram  Take 1 tablet (50 mg total) by mouth every 12 (twelve) hours as needed.   triamcinolone  cream 0.1 % Commonly known as: KENALOG  Apply 1 Application topically 2 (two) times daily.   Ubrelvy  100 MG Tabs Generic drug:  Ubrogepant  Take 1 tablet (100 mg total) by mouth as needed (for headaches). May repeat a dose in 2 hours if needed. Max dose 2 pills in 24 hours   vitamin B-12 100 MCG tablet Commonly known as: CYANOCOBALAMIN  Take 100 mcg by mouth daily.   Vitamin D3 125 MCG (5000 UT) Caps Take 5,000 Units by mouth daily.        Allergies:  Allergies  Allergen Reactions   Latex Rash and Other (See Comments)    Possible reaction to latex gloves per patient   Meclizine Other (See Comments)    Other reaction(s): Abdominal Pain, Other    Family History: Family History  Problem Relation Age of Onset   Aneurysm Mother        aortic   Hypertension Mother    Headache Mother    Prostate cancer Father    Hypertension Father    Heart failure Brother    Colon cancer Neg Hx    Colon polyps Neg Hx     Social History:  reports that he quit smoking about 5 years ago. His smoking use included cigarettes. He started smoking about 25 years ago. He has a 20 pack-year smoking history. He has been exposed to tobacco smoke. He has never used smokeless tobacco. He reports that he does not currently use alcohol. He reports that he does not use drugs.  ROS: All other review of systems were reviewed and are negative except what is noted above in HPI  Physical Exam: BP (!) 167/69   Pulse 70   Constitutional:  Alert and oriented, No acute distress. HEENT: [place] AT, moist mucus membranes.  Trachea midline, no masses. Cardiovascular: No clubbing, cyanosis, or edema. Respiratory: Normal respiratory effort, no increased work of breathing. GI: Abdomen is soft, nontender, nondistended, no abdominal masses GU: No CVA tenderness.  Lymph: No cervical or inguinal lymphadenopathy. Skin: No rashes, bruises or suspicious lesions. Neurologic: Grossly intact, no focal deficits, moving all 4 extremities. Psychiatric: Normal mood and affect.  Laboratory Data: Lab Results  Component Value Date   WBC 4.1 01/13/2024   HGB  13.2 01/13/2024   HCT 41.6 01/13/2024   MCV 90 01/13/2024   PLT 213 01/13/2024    Lab Results  Component Value Date   CREATININE 0.89 01/13/2024    Lab Results  Component Value Date   PSA 1.8 12/10/2019   PSA 2.3 11/10/2019    No results found for: TESTOSTERONE   Lab Results  Component Value Date   HGBA1C 5.6 10/09/2023    Urinalysis    Component Value Date/Time   COLORURINE STRAW (A) 01/07/2023 1953   APPEARANCEUR Clear 09/14/2023 0938   LABSPEC 1.008 01/07/2023 1953   PHURINE 7.0 01/07/2023 1953   GLUCOSEU Negative 09/14/2023 0938   HGBUR NEGATIVE 01/07/2023 1953   BILIRUBINUR Negative 09/14/2023 0938   KETONESUR NEGATIVE 01/07/2023 1953   PROTEINUR Negative 09/14/2023 0938   PROTEINUR NEGATIVE 01/07/2023 1953   UROBILINOGEN negative (A) 12/12/2019 1113   UROBILINOGEN 1.0 08/03/2012 1156   NITRITE Negative 09/14/2023 0938   NITRITE NEGATIVE 01/07/2023 1953   LEUKOCYTESUR Negative 09/14/2023 0938   LEUKOCYTESUR NEGATIVE 01/07/2023 1953    Lab Results  Component Value Date   LABMICR Comment 09/14/2023   WBCUA None seen 03/02/2020   LABEPIT None seen 03/02/2020   BACTERIA None seen 03/02/2020    Pertinent Imaging:  Results for orders placed during the hospital encounter of 11/07/19  DG Abdomen 1 View  Narrative CLINICAL DATA:  Constipation. Hemorrhoids. Irritable bowel syndrome. Prostate cancer.  EXAM: ABDOMEN - 1 VIEW  COMPARISON:  CT pelvis 09/25/2019 in CT abdomen 04/29/2019  FINDINGS: Oval-shaped densities along the midline of the upper abdomen, possibly in the stomach. The patient had gallstones on the 04/29/2019 exam but these densities appear more regular and more medially located than expected location for gallstones. There is another oval-shaped density projecting over the transverse colon and 1 along the rectum, again I suspect that these are within stomach/bowel.  The tiny punctate right kidney lower pole renal calculus shown  on 04/29/2019 is not readily seen on today's conventional radiographs.  Dextroconvex lumbar scoliosis with rotary component. No dilated bowel. Bowel gas pattern appears otherwise unremarkable.  IMPRESSION: 1. Oval-shaped densities in the abdomen are thought to be in the stomach and bowel. 2. No dilated bowel.  Unremarkable bowel gas pattern. 3. Dextroconvex lumbar scoliosis with rotary component.   Electronically Signed By: Ryan Salvage M.D. On: 11/07/2019 17:35  No results found for this or any previous visit.  No results found for this or any previous visit.  No results found for this or any previous visit.  No results found for this or any previous visit.  No results found for this or any previous visit.  No results found for this or any previous visit.  No results found for this or any previous visit.   Assessment & Plan:    1. Prostate cancer (HCC) (Primary) -PSMA PET - Urinalysis, Routine w reflex microscopic  2. Benign prostatic hyperplasia with urinary obstruction -continue uroxatral  10mg    3. Weak urinary stream Continue uroxatral  10mg  qhs   No follow-ups on file.  Belvie Clara, MD  [facility] [place]

## 2024-03-17 NOTE — Telephone Encounter (Signed)
 Referral sent

## 2024-03-17 NOTE — Patient Instructions (Signed)

## 2024-03-18 LAB — URINALYSIS, ROUTINE W REFLEX MICROSCOPIC
Bilirubin, UA: NEGATIVE
Glucose, UA: NEGATIVE
Ketones, UA: NEGATIVE
Leukocytes,UA: NEGATIVE
Nitrite, UA: NEGATIVE
Protein,UA: NEGATIVE
Specific Gravity, UA: 1.015 (ref 1.005–1.030)
Urobilinogen, Ur: 0.2 mg/dL (ref 0.2–1.0)
pH, UA: 6.5 (ref 5.0–7.5)

## 2024-03-18 LAB — MICROSCOPIC EXAMINATION
Bacteria, UA: NONE SEEN
Epithelial Cells (non renal): NONE SEEN /HPF (ref 0–10)
WBC, UA: NONE SEEN /HPF (ref 0–5)

## 2024-03-19 NOTE — Telephone Encounter (Signed)
 Patient advised.

## 2024-03-21 ENCOUNTER — Other Ambulatory Visit: Payer: Self-pay | Admitting: Neurosurgery

## 2024-03-21 DIAGNOSIS — G992 Myelopathy in diseases classified elsewhere: Secondary | ICD-10-CM

## 2024-03-24 ENCOUNTER — Ambulatory Visit
Admission: EM | Admit: 2024-03-24 | Discharge: 2024-03-24 | Disposition: A | Discharge status: Home / Self Care | Attending: Family Medicine | Admitting: Family Medicine

## 2024-03-24 DIAGNOSIS — J209 Acute bronchitis, unspecified: Secondary | ICD-10-CM

## 2024-03-24 DIAGNOSIS — J3089 Other allergic rhinitis: Secondary | ICD-10-CM | POA: Diagnosis not present

## 2024-03-24 MED ORDER — CETIRIZINE HCL 5 MG PO TABS: 5.0000 mg | ORAL_TABLET | Freq: Every day | ORAL | 2 refills | Status: DC

## 2024-03-24 MED ORDER — BENZONATATE 200 MG PO CAPS: 200.0000 mg | ORAL_CAPSULE | Freq: Three times a day (TID) | ORAL | 0 refills | Status: DC | PRN

## 2024-03-24 MED ORDER — DEXAMETHASONE SODIUM PHOSPHATE 10 MG/ML IJ SOLN
10.0000 mg | Freq: Once | INTRAMUSCULAR | Status: AC
  Administered 2024-03-24: 10 mg via INTRAMUSCULAR

## 2024-03-24 MED ORDER — AZELASTINE HCL 0.1 % NA SOLN: 1.0000 | Freq: Two times a day (BID) | NASAL | 0 refills | Status: DC

## 2024-03-24 NOTE — ED Triage Notes (Signed)
 Pt reports being seen in UC for cough, tightness on L side in abdomen, and intermittent SOB. Pt reports having wheezing at night. Pt denies fevers, n/v, and CP. Pt able to speak in complete sentences. Pt reports having inhaler, using it at night. Pt believes he possible has bronchitis.

## 2024-03-24 NOTE — ED Provider Notes (Signed)
 [facility]    CSN: [id number] Arrival date & time: 03/24/24  1049      History   Chief Complaint Chief Complaint  Patient presents with   Cough    HPI Steven [name] is a 73 y.o. male.   Patient presenting today with about a month of wheezing, chest tightness, cough, sinus drainage, tickling in his throat.  Denies fever, chills, chest pain, abdominal pain, vomiting, diarrhea but does note some soreness to his abdominal muscles from extent of coughing.  Primary care prescribed him an inhaler which he has been using about once to twice daily with some mild relief and he was also given a cough syrup with codeine  per patient that helps somewhat.  Uses Flonase  daily and sometimes Benadryl .    Past Medical History:  Diagnosis Date   Anxiety    Arthritis    Atrial fibrillation (HCC)    Atrial fibrillation (HCC)    Atrial flutter (HCC)    BPH (benign prostatic hyperplasia)    Chest pain    Dizziness    Dyspnea    laying down occ   Dysrhythmia    Fracture 08/17/2015   MULTIPLE RIB FRACTURES     FROM FALL    GERD (gastroesophageal reflux disease)    Hemorrhoids    History of kidney stones    noted on CT scan   History of radiation therapy 08/22/11-10/13/11   prostate   Hyperlipemia    Hypertension    Hyperthyroidism    IBS (irritable bowel syndrome)    Insomnia    Light headedness    Migraine    Numbness and tingling in left arm    Numbness and tingling of both legs    Open fracture of left elbow 08/18/2015   Pre-diabetes    Prostate cancer [facility])    prostate s/p radiation Mar 2013   Rib fractures 08/17/2015   Tinnitus     Patient Active Problem List   Diagnosis Date Noted   Tinea cruris 02/21/2024   Chronic pain of both knees 01/01/2024   Degeneration of intervertebral disc of lumbar region with discogenic back pain and lower extremity pain 01/01/2024   Chronic bronchitis (HCC) 11/30/2023   Chronic sinusitis 11/30/2023   Elevated PSA 11/15/2023    Headache with neurologic deficit 07/12/2023   Contact dermatitis 06/27/2023   Low back pain 06/27/2023   Upper respiratory infection 06/07/2023   Inflammatory arthritis 05/23/2023   Pain of left hip 05/11/2023   Migraine, unspecified, not intractable, without status migrainosus 05/08/2023   Depression with anxiety 04/19/2023   Mixed hyperlipidemia 03/14/2023   Prediabetes 03/14/2023   Diffuse pain 02/19/2023   Hypertensive urgency 11/24/2022   Leukopenia 11/24/2022   SVT (supraventricular tachycardia) (HCC) 11/24/2022   Orthostatic hypotension 11/23/2022   Chest tightness 08/22/2022   Hypokalemia 08/21/2022   Calculus of gallbladder without cholecystitis without obstruction 08/09/2022   Secondary adrenocortical insufficiency (HCC) 07/19/2022   Rash and nonspecific skin eruption 05/10/2022   Dysphagia 05/10/2022   Fibromyalgia 07/07/2020   Paresthesia of upper limb 03/04/2020   Carcinoma of prostate (HCC) 02/17/2020   Subacute thyroiditis 01/07/2020   Personal history of malignant neoplasm of prostate 12/12/2019   Benign prostatic hyperplasia with urinary obstruction 12/12/2019   Weak urinary stream 12/12/2019   Hyperthyroidism 11/24/2019   Unintentional weight loss 10/21/2019   IBS (irritable bowel syndrome) 10/21/2019   Dizzy spells 09/20/2019   Intractable chronic migraine without aura 09/20/2019   Sleep apnea 09/20/2019  Psychophysiologic insomnia 09/20/2019   Rectal pain 08/21/2019   Nausea without vomiting 05/30/2019   Paroxysmal atrial flutter (HCC) 04/17/2019   Chronic atrial fibrillation (HCC) 03/02/2019   HTN (hypertension) 03/02/2019   Constipation 08/02/2017   Hemorrhoids 08/02/2017   Fall 08/18/2015   Radiation proctitis 01/30/2013   Rectal bleeding 12/10/2012   Obese 05/30/2012   Dyspnea 05/30/2012   Elevated lipids 05/30/2012   Palpitations 05/30/2012   Malignant neoplasm of prostate (HCC) 08/13/2011    Past Surgical History:  Procedure Laterality  Date   BOTOX  INJECTION N/A 01/14/2020   Procedure: INJECTION OF BOTOX  INTO ANAL SPHINCTER;  Surgeon: Teresa Lonni HERO, MD;  Location: [facility];  Service: General;  Laterality: N/A;   COLONOSCOPY N/A 12/19/2012   MFM:Mzrujo 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 Normal appearing colon   COLONOSCOPY WITH PROPOFOL  N/A 10/23/2019   Procedure: COLONOSCOPY WITH PROPOFOL ;  Surgeon: Shaaron Lamar HERO, MD; external and grade 2 internal hemorrhoids, abnormal rectal blood vessels consistent with radiation proctitis s/p APC therapy, otherwise normal exam.   EVALUATION UNDER ANESTHESIA WITH ANAL FISTULECTOMY N/A 01/14/2020   Procedure: ANORECTAL EXAM UNDER ANESTHESIA;  Surgeon: Teresa Lonni HERO, MD;  Location: [facility];  Service: General;  Laterality: N/A;   HOT HEMOSTASIS  10/23/2019   Procedure: HOT HEMOSTASIS (ARGON PLASMA COAGULATION/BICAP);  Surgeon: Shaaron Lamar HERO, MD;  Location: [facility];  Service: Endoscopy;;  apc rectal proctitis     KIDNEY SURGERY  1982   kidney tube collapse repair   PROSTATE BIOPSY N/A 11/15/2023   Procedure: BIOPSY, PROSTATE;  Surgeon: Sherrilee Belvie CROME, MD;  Location: [facility];  Service: Urology;  Laterality: N/A;   PROSTATE BIOPSY N/A 11/15/2023   Procedure: BIOPSY, PROSTATE, RECTAL APPROACH, WITH US  GUIDANCE;  Surgeon: Sherrilee Belvie CROME, MD;  Location: [facility];  Service: Urology;  Laterality: N/A;   RADIOACTIVE SEED IMPLANT     Prostate   SVT ABLATION N/A 12/12/2022   Procedure: SVT ABLATION;  Surgeon: Waddell Danelle ORN, MD;  Location: [facility] CV [facility];  Service: Cardiovascular;  Laterality: N/A;   TRANSRECTAL ULTRASOUND N/A 11/15/2023   Procedure: ULTRASOUND, RECTAL APPROACH;  Surgeon: Sherrilee Belvie CROME, MD;  Location: [facility];  Service: Urology;  Laterality: N/A;       Home Medications    Prior to Admission medications   Medication Sig Start Date End Date Taking? Authorizing Provider  azelastine   (ASTELIN ) 0.1 % nasal spray Place 1 spray into both nostrils 2 (two) times daily. Use in each nostril as directed 03/24/24  Yes Stuart Vernell Norris, PA-C  benzonatate  (TESSALON ) 200 MG capsule Take 1 capsule (200 mg total) by mouth 3 (three) times daily as needed for cough. 03/24/24  Yes Stuart Vernell Norris, PA-C  cetirizine  (ZYRTEC ) 5 MG tablet Take 1 tablet (5 mg total) by mouth daily. 03/24/24  Yes Stuart Vernell Norris, PA-C  acetaminophen  (TYLENOL ) 500 MG tablet Take 1,000 mg by mouth every 8 (eight) hours as needed for moderate pain (pain score 4-6).    [provider]  albuterol  (VENTOLIN  HFA) 108 (90 Base) MCG/ACT inhaler Inhale 2 puffs into the lungs every 4 (four) hours as needed. 12/13/23   Del Orbe Polanco, Iliana, FNP  alfuzosin  (UROXATRAL ) 10 MG 24 hr tablet Take 1 tablet (10 mg total) by mouth daily with breakfast. 03/17/24   [name], Belvie CROME, MD  amLODipine  (NORVASC ) 10 MG tablet Take 1 tablet (10 mg total) by mouth daily. 04/27/23 11/12/23  Nishan, Peter C, MD  azelastine  (ASTELIN ) 0.1 % nasal spray Place 1 spray into both nostrils 2 (two) times daily. Use in each nostril as directed Patient taking differently: Place 1 spray into both nostrils 2 (two) times daily as needed (Congestion). Use in each nostril as directed 09/30/23   Stuart Vernell Norris, PA-C  Azelastine -Fluticasone  137-50 MCG/ACT SUSP Place 1 spray into the nose every 12 (twelve) hours. 01/24/23   Del Wilhelmena Lloyd Sola, FNP  benzonatate  (TESSALON ) 200 MG capsule Take 1 capsule (200 mg total) by mouth 2 (two) times daily as needed for cough. 01/01/24   Tobie Suzzane POUR, MD  cephALEXin  (KEFLEX ) 500 MG capsule Take 1 capsule (500 mg total) by mouth 4 (four) times daily. Patient not taking: Reported on 03/24/2024 02/04/24   Leath-Warren, Etta PARAS, NP  chlorhexidine  (HIBICLENS ) 4 % external liquid Apply a dime size amount of the solution to warm water  and cleanse the affected areas twice daily while symptoms persist.  02/04/24   Leath-Warren, Etta PARAS, NP  Cholecalciferol (VITAMIN D3) 125 MCG (5000 UT) CAPS Take 5,000 Units by mouth daily.    [provider]  cloNIDine  (CATAPRES ) 0.1 MG tablet Take 1 tablet (0.1 mg total) by mouth 2 (two) times daily as needed (SBP>160). 11/27/22   Maree, Pratik D, DO  DULoxetine  (CYMBALTA ) 60 MG capsule Take 1 capsule (60 mg total) by mouth daily. 07/12/23   Onita Duos, MD  ELIQUIS  5 MG TABS tablet TAKE 1 TABLET(5 MG) BY MOUTH TWICE DAILY 11/26/23   Waddell Danelle ORN, MD  ezetimibe  (ZETIA ) 10 MG tablet TAKE 1 TABLET(10 MG) BY MOUTH DAILY 12/24/23   Nida, Gebreselassie W, MD  furosemide  (LASIX ) 20 MG tablet Take 1 tablet (20 mg total) by mouth daily. Take with or after meal. 10/30/23   Waddell Danelle ORN, MD  gabapentin  (NEURONTIN ) 100 MG capsule Take 1 capsule (100 mg total) by mouth 3 (three) times daily as needed. 02/21/24   Del Wilhelmena Lloyd Sola, FNP  Galcanezumab -gnlm (EMGALITY ) 120 MG/ML SOAJ Inject 1 Pen into the skin every 30 (thirty) days. Patient not taking: Reported on 03/24/2024 09/25/22   Rush Nest, MD  guaiFENesin -codeine  100-10 MG/5ML syrup Take 5 mLs by mouth 3 (three) times daily as needed for cough. 11/30/23   Tobie Suzzane POUR, MD  hydrOXYzine  (ATARAX ) 10 MG tablet Take 1 tablet (10 mg total) by mouth 3 (three) times daily as needed for anxiety. Patient not taking: Reported on 03/24/2024 11/26/22   Maree Bracken D, DO  hyoscyamine  (LEVBID ) 0.375 MG 12 hr tablet Take 0.375 mg by mouth every 12 (twelve) hours as needed for cramping. 01/24/23   [provider]  ketoconazole  (NIZORAL ) 2 % cream Apply 1 Application topically daily. For 2-4 weeks 02/21/24   Del Wilhelmena Lloyd Sola, FNP  loratadine  (CLARITIN ) 10 MG tablet Take 1 tablet (10 mg total) by mouth daily. Patient taking differently: Take 10 mg by mouth daily as needed for allergies. 01/24/23   Del Orbe Polanco, Iliana, FNP  losartan  (COZAAR ) 100 MG tablet Take 100 mg by mouth daily.    [provider]   methimazole  (TAPAZOLE ) 5 MG tablet TAKE 1/2 TABLET BY MOUTH DAILY 01/28/24   Nida, Gebreselassie W, MD  metoprolol  tartrate (LOPRESSOR ) 25 MG tablet TAKE 1 TABLET(25 MG) BY MOUTH TWICE DAILY 01/04/24   Nishan, Peter C, MD  mirtazapine  (REMERON ) 15 MG tablet TAKE 1 TABLET(15 MG) BY MOUTH AT BEDTIME 11/15/23   Del Wilhelmena Lloyd Sola, FNP  NURTEC 75 MG  TBDP Take 75 mg by mouth daily as needed (migraine). 08/14/22   [provider]  omeprazole  (PRILOSEC) 20 MG capsule Take 20 mg by mouth daily. 06/12/23   [provider]  ondansetron  (ZOFRAN ) 4 MG tablet Take 1 tablet (4 mg total) by mouth every 8 (eight) hours as needed for nausea or vomiting. 03/16/23   Del Wilhelmena Falter, Iliana, FNP  polyethylene glycol (MIRALAX  / GLYCOLAX ) 17 g packet Take 17 g by mouth daily. 08/23/22   Ricky Fines, MD  potassium chloride  SA (KLOR-CON  M) 20 MEQ tablet Take 1 tablet (20 mEq total) by mouth daily. Patient taking differently: Take 40 mEq by mouth daily. 12/04/22   Dean Clarity, MD  tiZANidine  (ZANAFLEX ) 4 MG tablet Take 1 tablet (4 mg total) by mouth every 12 (twelve) hours as needed for muscle spasms. Patient not taking: Reported on 03/24/2024 01/01/24   Tobie Suzzane POUR, MD  traMADol  (ULTRAM ) 50 MG tablet Take 1 tablet (50 mg total) by mouth every 12 (twelve) hours as needed. Patient not taking: Reported on 03/24/2024 01/01/24   Tobie Suzzane POUR, MD  triamcinolone  cream (KENALOG ) 0.1 % Apply 1 Application topically 2 (two) times daily. 06/27/23   Del Wilhelmena Falter Sola, FNP  Ubrogepant  (UBRELVY ) 100 MG TABS Take 1 tablet (100 mg total) by mouth as needed (for headaches). May repeat a dose in 2 hours if needed. Max dose 2 pills in 24 hours Patient not taking: Reported on 03/24/2024 09/25/22   Rush Nest, MD  vitamin B-12 (CYANOCOBALAMIN ) 100 MCG tablet Take 100 mcg by mouth daily.    [provider]  gabapentin  (NEURONTIN ) 300 MG capsule Take 1 capsule (300 mg total) by mouth 3 (three) times  daily as needed. 02/19/23   Del Wilhelmena Falter Sola, FNP    Family History Family History  Problem Relation Age of Onset   Aneurysm Mother        aortic   Hypertension Mother    Headache Mother    Prostate cancer Father    Hypertension Father    Heart failure Brother    Colon cancer Neg Hx    Colon polyps Neg Hx     Social History Social History   Tobacco Use   Smoking status: Former    Current packs/day: 0.00    Average packs/day: 1 pack/day for 20.0 years (20.0 ttl pk-yrs)    Types: Cigarettes    Start date: 2000    Quit date: 2020    Years since quitting: 5.6    Passive exposure: Past   Smokeless tobacco: Never   Tobacco comments:    Quit smoking x 2 years    07/2015   SOMETIIMES i USE VAPOR     Smoking Cessation Classes, Agencies, Services & Resources Offered  Vaping Use   Vaping status: Never Used  Substance Use Topics   Alcohol use: Not Currently   Drug use: Never     Allergies   Latex and Meclizine   Review of Systems Review of Systems Per HPI  Physical Exam Triage Vital Signs ED Triage Vitals  Encounter Vitals Group     BP 03/24/24 1147 (!) 153/77     Girls Systolic BP Percentile --      Girls Diastolic BP Percentile --      Boys Systolic BP Percentile --      Boys Diastolic BP Percentile --      Pulse Rate 03/24/24 1147 67     Resp 03/24/24 1147 20  Temp 03/24/24 1147 98.3 F (36.8 C)     Temp Source 03/24/24 1147 Oral     SpO2 03/24/24 1147 96 %     Weight --      Height --      Head Circumference --      Peak Flow --      Pain Score 03/24/24 1148 0     Pain Loc --      Pain Education --      Exclude from Growth Chart --    No data found.  Updated Vital Signs BP (!) 153/77 (BP Location: Right Arm)   Pulse 67   Temp 98.3 F (36.8 C) (Oral)   Resp 20   SpO2 97%   Visual Acuity Right Eye Distance:   Left Eye Distance:   Bilateral Distance:    Right Eye Near:   Left Eye Near:    Bilateral Near:     Physical  Exam Vitals and nursing note reviewed.  Constitutional:      Appearance: He is well-developed.  HENT:     Head: Atraumatic.     Right Ear: External ear normal.     Left Ear: External ear normal.     Nose: Rhinorrhea present.     Mouth/Throat:     Mouth: Mucous membranes are moist.     Pharynx: No oropharyngeal exudate or posterior oropharyngeal erythema.  Eyes:     Conjunctiva/sclera: Conjunctivae normal.     Pupils: Pupils are equal, round, and reactive to light.  Cardiovascular:     Rate and Rhythm: Normal rate.  Pulmonary:     Effort: Pulmonary effort is normal. No respiratory distress.     Breath sounds: Wheezing present. No rales.  Musculoskeletal:        General: Normal range of motion.     Cervical back: Normal range of motion and neck supple.  Lymphadenopathy:     Cervical: No cervical adenopathy.  Skin:    General: Skin is warm and dry.  Neurological:     Mental Status: He is alert and oriented to person, place, and time.  Psychiatric:        Behavior: Behavior normal.      UC Treatments / Results  Labs (all labs ordered are listed, but only abnormal results are displayed) Labs Reviewed - No data to display  EKG   Radiology No results found.  Procedures Procedures (including critical care time)  Medications Ordered in UC Medications  dexamethasone  (DECADRON ) injection 10 mg (10 mg Intramuscular Given 03/24/24 1245)    Initial Impression / Assessment and Plan / UC Course  I have reviewed the triage vital signs and the nursing notes.  Pertinent labs & imaging results that were available during my care of the patient were reviewed by me and considered in my medical decision making (see chart for details).     Suspect uncontrolled seasonal allergies leading to an allergic bronchitis.  Treat with IM Decadron , Astelin , Zyrtec , Tessalon  Perles, supportive over-the-counter medications and home care.  Return for worsening symptoms. Final Clinical  Impressions(s) / UC Diagnoses   Final diagnoses:  Acute bronchitis, unspecified organism  Seasonal allergic rhinitis due to other allergic trigger     Discharge Instructions      Takes Zyrtec  and Astelin  consistently to keep allergy drainage and inflammation under control.  I suspect this is what is causing your bronchitis flare.  We have given you a steroid shot today to help with inflammation and cough and  I have prescribed some medications to further help your symptoms.  Follow-up for worsening or unresolving symptoms    ED Prescriptions     Medication Sig Dispense Auth. Provider   azelastine  (ASTELIN ) 0.1 % nasal spray Place 1 spray into both nostrils 2 (two) times daily. Use in each nostril as directed 30 mL Stuart Vernell Norris, PA-C   cetirizine  (ZYRTEC ) 5 MG tablet Take 1 tablet (5 mg total) by mouth daily. 30 tablet Stuart Vernell Norris, PA-C   benzonatate  (TESSALON ) 200 MG capsule Take 1 capsule (200 mg total) by mouth 3 (three) times daily as needed for cough. 20 capsule Stuart Vernell Norris, [place]      PDMP not reviewed this encounter.   Stuart Vernell Norris, [place] 03/24/24 1250

## 2024-03-24 NOTE — Discharge Instructions (Signed)
 Takes Zyrtec  and Astelin  consistently to keep allergy drainage and inflammation under control.  I suspect this is what is causing your bronchitis flare.  We have given you a steroid shot today to help with inflammation and cough and I have prescribed some medications to further help your symptoms.  Follow-up for worsening or unresolving symptoms

## 2024-03-26 ENCOUNTER — Telehealth: Payer: Self-pay | Admitting: Family Medicine

## 2024-03-26 NOTE — Telephone Encounter (Signed)
 Patient was calling wanting to be scheduled at [facility]. I advised him of the message from the Lung cancer screening program that because of the prostate cancer he was ineligible for the screening. He wants to know why that is. ALSO, states that since having covid in Jan he has been wheezing at night with some cough. Did a visit with UC 9/1 and was given the tessalon  perles again but the steroid shot seemed to help but its still not gone away. Wants to know if he needs an antibiotic or chest xray since its been going on so long

## 2024-03-26 NOTE — Telephone Encounter (Signed)
 If you're not feeling better after finishing the steroid pack, I can place an order for a chest X-ray. Please keep me updated on how you're feeling

## 2024-03-26 NOTE — Telephone Encounter (Signed)
 Copied from CRM #[id number]. Topic: Referral - Status >> Mar 26, 2024 11:56 AM Zebedee SAUNDERS wrote: Reason for CRM: Pt called to sent referral # [id number] to [facility]. Please call [phone] pt if possible.

## 2024-03-26 NOTE — Telephone Encounter (Signed)
**Note De-identified  [place] Obfuscation** Please advise 

## 2024-03-26 NOTE — Telephone Encounter (Signed)
 I don't see any attached message

## 2024-03-27 ENCOUNTER — Ambulatory Visit (HOSPITAL_COMMUNITY)
Admission: RE | Admit: 2024-03-27 | Discharge: 2024-03-27 | Disposition: A | Discharge status: Home / Self Care | Source: Ambulatory Visit | Attending: Urology | Admitting: Urology

## 2024-03-27 ENCOUNTER — Other Ambulatory Visit: Payer: Self-pay | Admitting: Family Medicine

## 2024-03-27 ENCOUNTER — Telehealth: Payer: Self-pay | Admitting: Family Medicine

## 2024-03-27 DIAGNOSIS — R972 Elevated prostate specific antigen [PSA]: Secondary | ICD-10-CM | POA: Insufficient documentation

## 2024-03-27 DIAGNOSIS — N134 Hydroureter: Secondary | ICD-10-CM | POA: Insufficient documentation

## 2024-03-27 DIAGNOSIS — C61 Malignant neoplasm of prostate: Secondary | ICD-10-CM | POA: Diagnosis not present

## 2024-03-27 DIAGNOSIS — M899 Disorder of bone, unspecified: Secondary | ICD-10-CM | POA: Insufficient documentation

## 2024-03-27 MED ORDER — FLOTUFOLASTAT F 18 GALLIUM 296-5846 MBQ/ML IV SOLN
8.3400 | Freq: Once | INTRAVENOUS | Status: AC
  Administered 2024-03-27: 8.34 via INTRAVENOUS
  Filled 2024-03-27: qty 9

## 2024-03-27 MED ORDER — GABAPENTIN 400 MG PO CAPS: 400.0000 mg | ORAL_CAPSULE | Freq: Three times a day (TID) | ORAL | 3 refills | Status: DC

## 2024-03-27 NOTE — Telephone Encounter (Signed)
Left vm stating rx was sent to pharmacy

## 2024-03-27 NOTE — Telephone Encounter (Signed)
 Copied from CRM #[id number]. Topic: General - Other >> Mar 26, 2024 11:59 AM Zebedee SAUNDERS wrote: Reason for CRM: Pt would like an increase of gabapentin  (NEURONTIN ) 100 MG capsule to 200 mg or 300 mg. Pt went on Monday, Sept. 1 to urgent care because of hip pain. Pt would like a call [phone] back.

## 2024-03-27 NOTE — Telephone Encounter (Signed)
 sent

## 2024-03-27 NOTE — Telephone Encounter (Signed)
Called patient and left message for them to return call at the office   

## 2024-03-31 DIAGNOSIS — L728 Other follicular cysts of the skin and subcutaneous tissue: Secondary | ICD-10-CM | POA: Diagnosis not present

## 2024-03-31 DIAGNOSIS — B356 Tinea cruris: Secondary | ICD-10-CM | POA: Diagnosis not present

## 2024-03-31 DIAGNOSIS — L710 Perioral dermatitis: Secondary | ICD-10-CM | POA: Diagnosis not present

## 2024-04-03 ENCOUNTER — Ambulatory Visit (INDEPENDENT_AMBULATORY_CARE_PROVIDER_SITE_OTHER): Admitting: Family Medicine

## 2024-04-03 VITALS — BP 171/80 | HR 70 | Temp 98.5°F | Resp 16 | Ht 67.0 in | Wt 243.1 lb

## 2024-04-03 DIAGNOSIS — R059 Cough, unspecified: Secondary | ICD-10-CM | POA: Insufficient documentation

## 2024-04-03 DIAGNOSIS — B349 Viral infection, unspecified: Secondary | ICD-10-CM | POA: Diagnosis not present

## 2024-04-03 MED ORDER — PROMETHAZINE-DM 6.25-15 MG/5ML PO SYRP: 5.0000 mL | ORAL_SOLUTION | Freq: Four times a day (QID) | ORAL | 0 refills | Status: AC | PRN

## 2024-04-03 NOTE — Progress Notes (Signed)
 Established Patient Office Visit  Subjective:  Patient ID: Steven [name], male    DOB: 1951-05-26  Age: 73 y.o. MRN: [id number]  CC:  Chief Complaint  Patient presents with   Wheezing    Has been going off and on for months. Had covid in Jan and has been having issues with mucus in his throat and trying to expel it. Wheezing is off and on. Went to urgent care last month for the same issue and was given steroid shot and it started helping but it came right back     HPI Steven [name] is a 73 y.o. male presents for follow-up with the above complaints. He was recently evaluated in urgent care for acute bronchitis and was treated with IM Decadron , Astelin , Zyrtec , and Tessalon  Perles. The patient denies malaise and denies recent sick contacts.  Past Medical History:  Diagnosis Date   Anxiety    Arthritis    Atrial fibrillation (HCC)    Atrial fibrillation (HCC)    Atrial flutter (HCC)    BPH (benign prostatic hyperplasia)    Chest pain    Dizziness    Dyspnea    laying down occ   Dysrhythmia    Fracture 08/17/2015   MULTIPLE RIB FRACTURES     FROM FALL    GERD (gastroesophageal reflux disease)    Hemorrhoids    History of kidney stones    noted on CT scan   History of radiation therapy 08/22/11-10/13/11   prostate   Hyperlipemia    Hypertension    Hyperthyroidism    IBS (irritable bowel syndrome)    Insomnia    Light headedness    Migraine    Numbness and tingling in left arm    Numbness and tingling of both legs    Open fracture of left elbow 08/18/2015   Pre-diabetes    Prostate cancer [facility])    prostate s/p radiation Mar 2013   Rib fractures 08/17/2015   Tinnitus     Past Surgical History:  Procedure Laterality Date   BOTOX  INJECTION N/A 01/14/2020   Procedure: INJECTION OF BOTOX  INTO ANAL SPHINCTER;  Surgeon: Teresa Lonni HERO, MD;  Location: [facility];  Service: General;  Laterality: N/A;   COLONOSCOPY N/A 12/19/2012   MFM:Mzrujo 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 Normal appearing colon   COLONOSCOPY WITH PROPOFOL  N/A 10/23/2019   Procedure: COLONOSCOPY WITH PROPOFOL ;  Surgeon: Shaaron Lamar HERO, MD; external and grade 2 internal hemorrhoids, abnormal rectal blood vessels consistent with radiation proctitis s/p APC therapy, otherwise normal exam.   EVALUATION UNDER ANESTHESIA WITH ANAL FISTULECTOMY N/A 01/14/2020   Procedure: ANORECTAL EXAM UNDER ANESTHESIA;  Surgeon: Teresa Lonni HERO, MD;  Location: [facility];  Service: General;  Laterality: N/A;   HOT HEMOSTASIS  10/23/2019   Procedure: HOT HEMOSTASIS (ARGON PLASMA COAGULATION/BICAP);  Surgeon: Shaaron Lamar HERO, MD;  Location: [facility];  Service: Endoscopy;;  apc rectal proctitis     KIDNEY SURGERY  1982   kidney tube collapse repair   PROSTATE BIOPSY N/A 11/15/2023   Procedure: BIOPSY, PROSTATE;  Surgeon: Sherrilee Belvie CROME, MD;  Location: [facility];  Service: Urology;  Laterality: N/A;   PROSTATE BIOPSY N/A 11/15/2023   Procedure: BIOPSY, PROSTATE, RECTAL APPROACH, WITH US  GUIDANCE;  Surgeon: Sherrilee Belvie CROME, MD;  Location: [facility];  Service: Urology;  Laterality: N/A;   RADIOACTIVE SEED IMPLANT     Prostate   SVT ABLATION  N/A 12/12/2022   Procedure: SVT ABLATION;  Surgeon: Waddell Danelle ORN, MD;  Location: [facility] INVASIVE CV LAB;  Service: Cardiovascular;  Laterality: N/A;   TRANSRECTAL ULTRASOUND N/A 11/15/2023   Procedure: ULTRASOUND, RECTAL APPROACH;  Surgeon: Sherrilee Belvie CROME, MD;  Location: [facility];  Service: Urology;  Laterality: N/A;    Family History  Problem Relation Age of Onset   Aneurysm Mother        aortic   Hypertension Mother    Headache Mother    Prostate cancer Father    Hypertension Father    Heart failure Brother    Colon cancer Neg Hx    Colon polyps Neg Hx     Social History   Socioeconomic History   Marital status: Single    Spouse name: Not on file   Number of children: 2   Years of  education: 12   Highest education level: 12th grade  Occupational History   Occupation: Custodian     Employer: [employer]  Tobacco Use   Smoking status: Former    Current packs/day: 0.00    Average packs/day: 1 pack/day for 20.0 years (20.0 ttl pk-yrs)    Types: Cigarettes    Start date: 2000    Quit date: 2020    Years since quitting: 5.6    Passive exposure: Past   Smokeless tobacco: Never   Tobacco comments:    Quit smoking x 2 years    07/2015   SOMETIIMES i USE VAPOR     Smoking Cessation Classes, Agencies, Aeronautical engineer Use   Vaping status: Never Used  Substance and Sexual Activity   Alcohol use: Not Currently   Drug use: Never   Sexual activity: Not Currently    Partners: Female  Other Topics Concern   Not on file  Social History Narrative   Lives with friend   Divorced   No caffeine   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   Social Drivers of Corporate investment banker Strain: Low Risk  (06/25/2023)   Overall Financial Resource Strain (CARDIA)    Difficulty of Paying Living Expenses: Not hard at all  Recent Concern: Financial Resource Strain - High Risk (05/08/2023)   Overall Financial Resource Strain (CARDIA)    Difficulty of Paying Living Expenses: Hard  Food Insecurity: No Food Insecurity (06/25/2023)   Hunger Vital Sign    Worried About Running Out of Food in the Last Year: Never true    Ran Out of Food in the Last Year: Never true  Transportation Needs: No Transportation Needs (06/25/2023)   PRAPARE - Administrator, Civil Service (Medical): No    Lack of Transportation (Non-Medical): No  Physical Activity: Sufficiently Active (06/25/2023)   Exercise Vital Sign    Days of Exercise per Week: 7 days    Minutes of Exercise per Session: 50 min  Stress: Stress Concern Present (06/25/2023)   [employer] of Occupational Health - Occupational Stress Questionnaire    Feeling of Stress : To some  extent  Social Connections: Moderately Integrated (06/25/2023)   Social Connection and Isolation Panel    Frequency of Communication with Friends and Family: More than three times a week    Frequency of Social Gatherings with Friends and Family: More than three times a week    Attends Religious Services: More than 4 times per year    Active Member of Clubs or Organizations: No  Attends Banker Meetings: Never    Marital Status: Living with partner  Intimate Partner Violence: Not At Risk (06/25/2023)   Humiliation, Afraid, Rape, and Kick questionnaire    Fear of Current or Ex-Partner: No    Emotionally Abused: No    Physically Abused: No    Sexually Abused: No    Outpatient Medications Prior to Visit  Medication Sig Dispense Refill   acetaminophen  (TYLENOL ) 500 MG tablet Take 1,000 mg by mouth every 8 (eight) hours as needed for moderate pain (pain score 4-6).     albuterol  (VENTOLIN  HFA) 108 (90 Base) MCG/ACT inhaler Inhale 2 puffs into the lungs every 4 (four) hours as needed. 18 g 2   alfuzosin  (UROXATRAL ) 10 MG 24 hr tablet Take 1 tablet (10 mg total) by mouth daily with breakfast. 90 tablet 3   amLODipine  (NORVASC ) 10 MG tablet Take 1 tablet (10 mg total) by mouth daily. 90 tablet 3   azelastine  (ASTELIN ) 0.1 % nasal spray Place 1 spray into both nostrils 2 (two) times daily. Use in each nostril as directed (Patient taking differently: Place 1 spray into both nostrils 2 (two) times daily as needed (Congestion). Use in each nostril as directed) 30 mL 0   azelastine  (ASTELIN ) 0.1 % nasal spray Place 1 spray into both nostrils 2 (two) times daily. Use in each nostril as directed 30 mL 0   Azelastine -Fluticasone  137-50 MCG/ACT SUSP Place 1 spray into the nose every 12 (twelve) hours. 23 g 1   benzonatate  (TESSALON ) 200 MG capsule Take 1 capsule (200 mg total) by mouth 2 (two) times daily as needed for cough. 20 capsule 0   cetirizine  (ZYRTEC ) 5 MG tablet Take 1 tablet (5 mg  total) by mouth daily. 30 tablet 2   chlorhexidine  (HIBICLENS ) 4 % external liquid Apply a dime size amount of the solution to warm water  and cleanse the affected areas twice daily while symptoms persist. 118 mL 0   Cholecalciferol (VITAMIN D3) 125 MCG (5000 UT) CAPS Take 5,000 Units by mouth daily.     cloNIDine  (CATAPRES ) 0.1 MG tablet Take 1 tablet (0.1 mg total) by mouth 2 (two) times daily as needed (SBP>160). 60 tablet 11   DULoxetine  (CYMBALTA ) 60 MG capsule Take 1 capsule (60 mg total) by mouth daily. 90 capsule 3   ELIQUIS  5 MG TABS tablet TAKE 1 TABLET(5 MG) BY MOUTH TWICE DAILY 60 tablet 5   ezetimibe  (ZETIA ) 10 MG tablet TAKE 1 TABLET(10 MG) BY MOUTH DAILY 90 tablet 1   furosemide  (LASIX ) 20 MG tablet Take 1 tablet (20 mg total) by mouth daily. Take with or after meal. 90 tablet 1   gabapentin  (NEURONTIN ) 400 MG capsule Take 1 capsule (400 mg total) by mouth 3 (three) times daily. 90 capsule 3   Galcanezumab -gnlm (EMGALITY ) 120 MG/ML SOAJ Inject 1 Pen into the skin every 30 (thirty) days. 1.12 mL 7   hydrOXYzine  (ATARAX ) 10 MG tablet Take 1 tablet (10 mg total) by mouth 3 (three) times daily as needed for anxiety. 30 tablet 0   hyoscyamine  (LEVBID ) 0.375 MG 12 hr tablet Take 0.375 mg by mouth every 12 (twelve) hours as needed for cramping.     ketoconazole  (NIZORAL ) 2 % cream Apply 1 Application topically daily. For 2-4 weeks 30 g 0   loratadine  (CLARITIN ) 10 MG tablet Take 1 tablet (10 mg total) by mouth daily. (Patient taking differently: Take 10 mg by mouth daily as needed for allergies.) 30 tablet 11  losartan  (COZAAR ) 100 MG tablet Take 100 mg by mouth daily.     methimazole  (TAPAZOLE ) 5 MG tablet TAKE 1/2 TABLET BY MOUTH DAILY 90 tablet 0   metoprolol  tartrate (LOPRESSOR ) 25 MG tablet TAKE 1 TABLET(25 MG) BY MOUTH TWICE DAILY 180 tablet 1   mirtazapine  (REMERON ) 15 MG tablet TAKE 1 TABLET(15 MG) BY MOUTH AT BEDTIME 30 tablet 2   NURTEC 75 MG TBDP Take 75 mg by mouth daily as  needed (migraine).     omeprazole  (PRILOSEC) 20 MG capsule Take 20 mg by mouth daily.     ondansetron  (ZOFRAN ) 4 MG tablet Take 1 tablet (4 mg total) by mouth every 8 (eight) hours as needed for nausea or vomiting. 20 tablet 1   polyethylene glycol (MIRALAX  / GLYCOLAX ) 17 g packet Take 17 g by mouth daily. 30 each 2   potassium chloride  SA (KLOR-CON  M) 20 MEQ tablet Take 1 tablet (20 mEq total) by mouth daily. (Patient taking differently: Take 40 mEq by mouth daily.) 30 tablet 0   tiZANidine  (ZANAFLEX ) 4 MG tablet Take 1 tablet (4 mg total) by mouth every 12 (twelve) hours as needed for muscle spasms. 30 tablet 0   traMADol  (ULTRAM ) 50 MG tablet Take 1 tablet (50 mg total) by mouth every 12 (twelve) hours as needed. 20 tablet 0   triamcinolone  cream (KENALOG ) 0.1 % Apply 1 Application topically 2 (two) times daily. 30 g 0   Ubrogepant  (UBRELVY ) 100 MG TABS Take 1 tablet (100 mg total) by mouth as needed (for headaches). May repeat a dose in 2 hours if needed. Max dose 2 pills in 24 hours 16 tablet 6   vitamin B-12 (CYANOCOBALAMIN ) 100 MCG tablet Take 100 mcg by mouth daily.     benzonatate  (TESSALON ) 200 MG capsule Take 1 capsule (200 mg total) by mouth 3 (three) times daily as needed for cough. (Patient not taking: Reported on 04/03/2024) 20 capsule 0   cephALEXin  (KEFLEX ) 500 MG capsule Take 1 capsule (500 mg total) by mouth 4 (four) times daily. (Patient not taking: Reported on 04/03/2024) 28 capsule 0   guaiFENesin -codeine  100-10 MG/5ML syrup Take 5 mLs by mouth 3 (three) times daily as needed for cough. (Patient not taking: Reported on 04/03/2024) 120 mL 0   No facility-administered medications prior to visit.    Allergies  Allergen Reactions   Latex Rash and Other (See Comments)    Possible reaction to latex gloves per patient   Meclizine Other (See Comments)    Other reaction(s): Abdominal Pain, Other    ROS Review of Systems  Constitutional:  Negative for fatigue and fever.  HENT:   Positive for postnasal drip. Negative for ear pain, sinus pressure and sinus pain.   Respiratory:  Positive for cough. Negative for chest tightness and shortness of breath.   Cardiovascular:  Negative for chest pain and palpitations.      Objective:    Physical Exam HENT:     Head: Normocephalic.     Right Ear: External ear normal.     Left Ear: External ear normal.     Nose: No congestion or rhinorrhea.     Mouth/Throat:     Mouth: Mucous membranes are moist.  Cardiovascular:     Rate and Rhythm: Regular rhythm.     Heart sounds: No murmur heard. Pulmonary:     Effort: No respiratory distress.     Breath sounds: Normal breath sounds.  Neurological:     Mental Status: He is alert.  BP (!) 171/80   Pulse 70   Temp 98.5 F (36.9 C) (Oral)   Resp 16   Ht 5' 7 (1.702 m)   Wt 243 lb 1.9 oz (110.3 kg)   SpO2 94%   BMI 38.08 kg/m  Wt Readings from Last 3 Encounters:  04/03/24 243 lb 1.9 oz (110.3 kg)  02/21/24 234 lb (106.1 kg)  01/13/24 240 lb 9.6 oz (109.1 kg)    Lab Results  Component Value Date   TSH 3.870 10/09/2023   Lab Results  Component Value Date   WBC 4.1 01/13/2024   HGB 13.2 01/13/2024   HCT 41.6 01/13/2024   MCV 90 01/13/2024   PLT 213 01/13/2024   Lab Results  Component Value Date   NA 144 01/13/2024   K 3.8 01/13/2024   CO2 26 01/13/2024   GLUCOSE 107 (H) 01/13/2024   BUN 8 01/13/2024   CREATININE 0.89 01/13/2024   BILITOT 0.4 01/07/2023   ALKPHOS 68 01/07/2023   AST 15 01/07/2023   ALT 12 01/07/2023   PROT 6.8 01/07/2023   ALBUMIN 3.6 01/07/2023   CALCIUM  9.5 01/13/2024   ANIONGAP 10 10/09/2023   EGFR 91 01/13/2024   Lab Results  Component Value Date   CHOL 165 10/09/2023   Lab Results  Component Value Date   HDL 45 10/09/2023   Lab Results  Component Value Date   LDLCALC 95 10/09/2023   Lab Results  Component Value Date   TRIG 139 10/09/2023   Lab Results  Component Value Date   CHOLHDL 3.7 10/09/2023   Lab  Results  Component Value Date   HGBA1C 5.6 10/09/2023      Assessment & Plan:  Cough, unspecified type Assessment & Plan: Start taking Promethazine  DM 5 mL by mouth every 4 hours as needed for cough and cold symptoms. Increase fluid intake and allow for plenty of rest. Take Tylenol  as needed for pain, fever, or general discomfort. Ginger tea: Reduces throat irritation and can help with nausea. Look for sugar-free or honey-based throat lozenges to help with a sore throat. Use a humidifier at bedtime to help with cough and nasal congestion. For nasal congestion: The use of heated humidified air is a safe and effective therapy. Saline nasal sprays may also help alleviate nasal symptoms of the common cold.   Orders: -     Promethazine -DM; Take 5 mLs by mouth 4 (four) times daily as needed.  Dispense: 118 mL; Refill: 0  Viral illness -     COVID-19, Flu A+B and RSV   Note: This chart has been completed using Engineer, civil (consulting) software, and while attempts have been made to ensure accuracy, certain words and phrases may not be transcribed as intended.   Follow-up: No follow-ups on file.   Dajuan Turnley, FNP

## 2024-04-03 NOTE — Patient Instructions (Addendum)
 I appreciate the opportunity to provide care to you today!  Viral Illness:  I recommend symptomatic treatments with the following: Start taking Promethazine  DM 5 mL by mouth every 4 hours as needed for cough and cold symptoms. Increase fluid intake and allow for plenty of rest. Take Tylenol  as needed for pain, fever, or general discomfort. Perform warm saltwater gargles 3-4 times daily to help with throat pain or discomfort. (Mix 1/2 teaspoon of salt in a glass of warm water  and gargle several times daily to reduce throat inflammation and soothe irritation.) Ginger tea: Reduces throat irritation and can help with nausea. Look for sugar-free or honey-based throat lozenges to help with a sore throat. Use a humidifier at bedtime to help with cough and nasal congestion. For nasal congestion: The use of heated humidified air is a safe and effective therapy. Saline nasal sprays may also help alleviate nasal symptoms of the common cold. For cough: Treatment with honey may reduce cough frequency and severity.   Please follow up if your symptoms worsen or fail to improve.    Please continue to a heart-healthy diet and increase your physical activities. Try to exercise for at least five days a week.    It was a pleasure to see you and I look forward to continuing to work together on your health and well-being. Please do not hesitate to call the office if you need care or have questions about your care.  In case of emergency, please visit the Emergency Department for urgent care, or contact our clinic at [phone] to schedule an appointment. We're here to help you!   Have a wonderful day and week. With Gratitude, Toccara Alford MSN, FNP-BC

## 2024-04-03 NOTE — Assessment & Plan Note (Signed)
 Start taking Promethazine  DM 5 mL by mouth every 4 hours as needed for cough and cold symptoms. Increase fluid intake and allow for plenty of rest. Take Tylenol  as needed for pain, fever, or general discomfort. Ginger tea: Reduces throat irritation and can help with nausea. Look for sugar-free or honey-based throat lozenges to help with a sore throat. Use a humidifier at bedtime to help with cough and nasal congestion. For nasal congestion: The use of heated humidified air is a safe and effective therapy. Saline nasal sprays may also help alleviate nasal symptoms of the common cold.

## 2024-04-05 ENCOUNTER — Ambulatory Visit: Payer: Self-pay | Admitting: Family Medicine

## 2024-04-05 LAB — COVID-19, FLU A+B AND RSV
Influenza A, NAA: NOT DETECTED
Influenza B, NAA: NOT DETECTED
RSV, NAA: NOT DETECTED
SARS-CoV-2, NAA: NOT DETECTED

## 2024-04-05 NOTE — Progress Notes (Signed)
 Please inform the patient: Your test results are back and they are negative for COVID-19, influenza, and RSV.

## 2024-04-09 DIAGNOSIS — E0590 Thyrotoxicosis, unspecified without thyrotoxic crisis or storm: Secondary | ICD-10-CM | POA: Diagnosis not present

## 2024-04-10 LAB — T3, FREE: T3, Free: 3 pg/mL (ref 2.0–4.4)

## 2024-04-10 LAB — TSH: TSH: 3.24 u[IU]/mL (ref 0.450–4.500)

## 2024-04-10 LAB — T4, FREE: Free T4: 1 ng/dL (ref 0.82–1.77)

## 2024-04-11 ENCOUNTER — Ambulatory Visit: Payer: Medicare PPO | Admitting: Urology

## 2024-04-11 ENCOUNTER — Telehealth: Payer: Self-pay | Admitting: Urology

## 2024-04-11 NOTE — Telephone Encounter (Signed)
 Patient returned call requesting Pet Scan results.   Test was completed on 03/28/2024.   Best number to contact patient [phone] Call transferred to clinical basket.

## 2024-04-11 NOTE — Telephone Encounter (Signed)
 Return call to pt and made him aware results have been forward to Dr. Sherrilee to review. Once review someone will reach out with Md recommendations. Verbalized understanding.

## 2024-04-15 ENCOUNTER — Ambulatory Visit: Payer: Self-pay

## 2024-04-16 ENCOUNTER — Encounter: Payer: Self-pay | Admitting: "Endocrinology

## 2024-04-16 ENCOUNTER — Ambulatory Visit: Admitting: "Endocrinology

## 2024-04-16 VITALS — BP 94/56 | HR 68 | Ht 67.0 in | Wt 238.4 lb

## 2024-04-16 DIAGNOSIS — E0590 Thyrotoxicosis, unspecified without thyrotoxic crisis or storm: Secondary | ICD-10-CM | POA: Diagnosis not present

## 2024-04-16 DIAGNOSIS — R7303 Prediabetes: Secondary | ICD-10-CM | POA: Diagnosis not present

## 2024-04-16 DIAGNOSIS — E782 Mixed hyperlipidemia: Secondary | ICD-10-CM | POA: Diagnosis not present

## 2024-04-16 MED ORDER — METHIMAZOLE 5 MG PO TABS: 2.5000 mg | ORAL_TABLET | Freq: Every day | ORAL | 0 refills | Status: DC

## 2024-04-16 NOTE — Telephone Encounter (Signed)
 Patient called back wanting results

## 2024-04-16 NOTE — Telephone Encounter (Signed)
 Pt called to ask if he needed a sooner appt and clarity on PET scan results pt advised a message would be sent to the provider and as far as the PET scan pt was advised the PET scan was to insure the cancer had not spread pt was  given results per MD [name] response. Pt voiced his understanding and advised we would call back if he needs a sooner appt pt also stated he had taken some tumeric and his urine was a darker yellow, but he knew that taking tumeric could change urine color just wanted to let us  know he took it once and stopped and since he stopped taking tumeric his urine has cleared

## 2024-04-16 NOTE — Progress Notes (Signed)
 04/16/2024     Endocrinology follow-up note   Subjective:    Patient ID: Steven [name], male    DOB: 02-25-1951, PCP Del Wilhelmena Lloyd Sola, FNP.   Past Medical History:  Diagnosis Date   Anxiety    Arthritis    Atrial fibrillation (HCC)    Atrial fibrillation (HCC)    Atrial flutter (HCC)    BPH (benign prostatic hyperplasia)    Chest pain    Dizziness    Dyspnea    laying down occ   Dysrhythmia    Fracture 08/17/2015   MULTIPLE RIB FRACTURES     FROM FALL    GERD (gastroesophageal reflux disease)    Hemorrhoids    History of kidney stones    noted on CT scan   History of radiation therapy 08/22/11-10/13/11   prostate   Hyperlipemia    Hypertension    Hyperthyroidism    IBS (irritable bowel syndrome)    Insomnia    Light headedness    Migraine    Numbness and tingling in left arm    Numbness and tingling of both legs    Open fracture of left elbow 08/18/2015   Pre-diabetes    Prostate cancer [facility])    prostate s/p radiation Mar 2013   Rib fractures 08/17/2015   Tinnitus     Past Surgical History:  Procedure Laterality Date   BOTOX  INJECTION N/A 01/14/2020   Procedure: INJECTION OF BOTOX  INTO ANAL SPHINCTER;  Surgeon: Teresa Lonni HERO, MD;  Location: [facility];  Service: General;  Laterality: N/A;   COLONOSCOPY N/A 12/19/2012   MFM:Mzrujo 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 Normal appearing colon   COLONOSCOPY WITH PROPOFOL  N/A 10/23/2019   Procedure: COLONOSCOPY WITH PROPOFOL ;  Surgeon: Shaaron Lamar HERO, MD; external and grade 2 internal hemorrhoids, abnormal rectal blood vessels consistent with radiation proctitis s/p APC therapy, otherwise normal exam.   EVALUATION UNDER ANESTHESIA WITH ANAL FISTULECTOMY N/A 01/14/2020   Procedure: ANORECTAL EXAM UNDER ANESTHESIA;  Surgeon: Teresa Lonni HERO, MD;  Location: [facility];  Service: General;  Laterality: N/A;   HOT HEMOSTASIS  10/23/2019    Procedure: HOT HEMOSTASIS (ARGON PLASMA COAGULATION/BICAP);  Surgeon: Shaaron Lamar HERO, MD;  Location: [facility];  Service: Endoscopy;;  apc rectal proctitis     KIDNEY SURGERY  1982   kidney tube collapse repair   PROSTATE BIOPSY N/A 11/15/2023   Procedure: BIOPSY, PROSTATE;  Surgeon: Sherrilee Belvie CROME, MD;  Location: [facility];  Service: Urology;  Laterality: N/A;   PROSTATE BIOPSY N/A 11/15/2023   Procedure: BIOPSY, PROSTATE, RECTAL APPROACH, WITH US  GUIDANCE;  Surgeon: Sherrilee Belvie CROME, MD;  Location: [facility];  Service: Urology;  Laterality: N/A;   RADIOACTIVE SEED IMPLANT     Prostate   SVT ABLATION N/A 12/12/2022   Procedure: SVT ABLATION;  Surgeon: Waddell Danelle ORN, MD;  Location: [facility];  Service: Cardiovascular;  Laterality: N/A;   TRANSRECTAL ULTRASOUND N/A 11/15/2023   Procedure: ULTRASOUND, RECTAL APPROACH;  Surgeon: Sherrilee Belvie CROME, MD;  Location: [facility];  Service: Urology;  Laterality: N/A;    Social History   Socioeconomic History   Marital status: Single    Spouse name: Not on file   Number of children: 2   Years of education: 12   Highest education level: 12th grade  Occupational History   Occupation: Custodian     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, therapeutic  Tobacco  Use   Smoking status: Former    Current packs/day: 0.00    Average packs/day: 1 pack/day for 20.0 years (20.0 ttl pk-yrs)    Types: Cigarettes    Start date: 2000    Quit date: 2020    Years since quitting: 5.7    Passive exposure: Past   Smokeless tobacco: Never   Tobacco comments:    Quit smoking x 2 years    07/2015   SOMETIIMES i USE VAPOR     Smoking Cessation Classes, Agencies, Aeronautical engineer Use   Vaping status: Never Used  Substance and Sexual Activity   Alcohol use: Not Currently   Drug use: Never   Sexual activity: Not Currently    Partners: Female  Other Topics Concern   Not on file  Social History Narrative   Lives with friend   Divorced   No  caffeine   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   Social Drivers of Corporate investment banker Strain: Low Risk  (06/25/2023)   Overall Financial Resource Strain (CARDIA)    Difficulty of Paying Living Expenses: Not hard at all  Recent Concern: Financial Resource Strain - High Risk (05/08/2023)   Overall Financial Resource Strain (CARDIA)    Difficulty of Paying Living Expenses: Hard  Food Insecurity: No Food Insecurity (06/25/2023)   Hunger Vital Sign    Worried About Running Out of Food in the Last Year: Never true    Ran Out of Food in the Last Year: Never true  Transportation Needs: No Transportation Needs (06/25/2023)   PRAPARE - Administrator, Civil Service (Medical): No    Lack of Transportation (Non-Medical): No  Physical Activity: Sufficiently Active (06/25/2023)   Exercise Vital Sign    Days of Exercise per Week: 7 days    Minutes of Exercise per Session: 50 min  Stress: Stress Concern Present (06/25/2023)   [employer] of Occupational Health - Occupational Stress Questionnaire    Feeling of Stress : To some extent  Social Connections: Moderately Integrated (06/25/2023)   Social Connection and Isolation Panel    Frequency of Communication with Friends and Family: More than three times a week    Frequency of Social Gatherings with Friends and Family: More than three times a week    Attends Religious Services: More than 4 times per year    Active Member of [employer] or Organizations: No    Attends Banker Meetings: Never    Marital Status: Living with partner    Family History  Problem Relation Age of Onset   Aneurysm Mother        aortic   Hypertension Mother    Headache Mother    Prostate cancer Father    Hypertension Father    Heart failure Brother    Colon cancer Neg Hx    Colon polyps Neg Hx     Outpatient Encounter Medications as of 04/16/2024  Medication Sig   acetaminophen  (TYLENOL ) 500 MG tablet Take 1,000 mg by  mouth every 8 (eight) hours as needed for moderate pain (pain score 4-6).   albuterol  (VENTOLIN  HFA) 108 (90 Base) MCG/ACT inhaler Inhale 2 puffs into the lungs every 4 (four) hours as needed.   alfuzosin  (UROXATRAL ) 10 MG 24 hr tablet Take 1 tablet (10 mg total) by mouth daily with breakfast.   amLODipine  (NORVASC ) 10 MG tablet Take 1 tablet (10 mg total) by mouth daily.   azelastine  (  ASTELIN ) 0.1 % nasal spray Place 1 spray into both nostrils 2 (two) times daily. Use in each nostril as directed (Patient taking differently: Place 1 spray into both nostrils 2 (two) times daily as needed (Congestion). Use in each nostril as directed)   azelastine  (ASTELIN ) 0.1 % nasal spray Place 1 spray into both nostrils 2 (two) times daily. Use in each nostril as directed   Azelastine -Fluticasone  137-50 MCG/ACT SUSP Place 1 spray into the nose every 12 (twelve) hours.   benzonatate  (TESSALON ) 200 MG capsule Take 1 capsule (200 mg total) by mouth 2 (two) times daily as needed for cough.   benzonatate  (TESSALON ) 200 MG capsule Take 1 capsule (200 mg total) by mouth 3 (three) times daily as needed for cough. (Patient not taking: Reported on 04/03/2024)   cephALEXin  (KEFLEX ) 500 MG capsule Take 1 capsule (500 mg total) by mouth 4 (four) times daily. (Patient not taking: Reported on 04/03/2024)   cetirizine  (ZYRTEC ) 5 MG tablet Take 1 tablet (5 mg total) by mouth daily.   chlorhexidine  (HIBICLENS ) 4 % external liquid Apply a dime size amount of the solution to warm water  and cleanse the affected areas twice daily while symptoms persist.   Cholecalciferol (VITAMIN D3) 125 MCG (5000 UT) CAPS Take 5,000 Units by mouth daily.   cloNIDine  (CATAPRES ) 0.1 MG tablet Take 1 tablet (0.1 mg total) by mouth 2 (two) times daily as needed (SBP>160).   DULoxetine  (CYMBALTA ) 60 MG capsule Take 1 capsule (60 mg total) by mouth daily.   ELIQUIS  5 MG TABS tablet TAKE 1 TABLET(5 MG) BY MOUTH TWICE DAILY   ezetimibe  (ZETIA ) 10 MG tablet TAKE  1 TABLET(10 MG) BY MOUTH DAILY   furosemide  (LASIX ) 20 MG tablet Take 1 tablet (20 mg total) by mouth daily. Take with or after meal.   gabapentin  (NEURONTIN ) 400 MG capsule Take 1 capsule (400 mg total) by mouth 3 (three) times daily.   Galcanezumab -gnlm (EMGALITY ) 120 MG/ML SOAJ Inject 1 Pen into the skin every 30 (thirty) days.   guaiFENesin -codeine  100-10 MG/5ML syrup Take 5 mLs by mouth 3 (three) times daily as needed for cough. (Patient not taking: Reported on 04/03/2024)   hydrOXYzine  (ATARAX ) 10 MG tablet Take 1 tablet (10 mg total) by mouth 3 (three) times daily as needed for anxiety.   hyoscyamine  (LEVBID ) 0.375 MG 12 hr tablet Take 0.375 mg by mouth every 12 (twelve) hours as needed for cramping.   ketoconazole  (NIZORAL ) 2 % cream Apply 1 Application topically daily. For 2-4 weeks   loratadine  (CLARITIN ) 10 MG tablet Take 1 tablet (10 mg total) by mouth daily. (Patient taking differently: Take 10 mg by mouth daily as needed for allergies.)   losartan  (COZAAR ) 100 MG tablet Take 100 mg by mouth daily.   methimazole  (TAPAZOLE ) 5 MG tablet Take 0.5 tablets (2.5 mg total) by mouth daily.   metoprolol  tartrate (LOPRESSOR ) 25 MG tablet TAKE 1 TABLET(25 MG) BY MOUTH TWICE DAILY   mirtazapine  (REMERON ) 15 MG tablet TAKE 1 TABLET(15 MG) BY MOUTH AT BEDTIME   NURTEC 75 MG TBDP Take 75 mg by mouth daily as needed (migraine).   omeprazole  (PRILOSEC) 20 MG capsule Take 20 mg by mouth daily.   ondansetron  (ZOFRAN ) 4 MG tablet Take 1 tablet (4 mg total) by mouth every 8 (eight) hours as needed for nausea or vomiting.   polyethylene glycol (MIRALAX  / GLYCOLAX ) 17 g packet Take 17 g by mouth daily.   potassium chloride  SA (KLOR-CON  M) 20 MEQ tablet Take  1 tablet (20 mEq total) by mouth daily. (Patient taking differently: Take 40 mEq by mouth daily.)   promethazine -dextromethorphan  (PROMETHAZINE -DM) 6.25-15 MG/5ML syrup Take 5 mLs by mouth 4 (four) times daily as needed.   tiZANidine  (ZANAFLEX ) 4 MG  tablet Take 1 tablet (4 mg total) by mouth every 12 (twelve) hours as needed for muscle spasms.   traMADol  (ULTRAM ) 50 MG tablet Take 1 tablet (50 mg total) by mouth every 12 (twelve) hours as needed.   triamcinolone  cream (KENALOG ) 0.1 % Apply 1 Application topically 2 (two) times daily.   Ubrogepant  (UBRELVY ) 100 MG TABS Take 1 tablet (100 mg total) by mouth as needed (for headaches). May repeat a dose in 2 hours if needed. Max dose 2 pills in 24 hours   vitamin B-12 (CYANOCOBALAMIN ) 100 MCG tablet Take 100 mcg by mouth daily.   [DISCONTINUED] gabapentin  (NEURONTIN ) 300 MG capsule Take 1 capsule (300 mg total) by mouth 3 (three) times daily as needed.   [DISCONTINUED] methimazole  (TAPAZOLE ) 5 MG tablet TAKE 1/2 TABLET BY MOUTH DAILY   No facility-administered encounter medications on file as of 04/16/2024.    ALLERGIES: Allergies  Allergen Reactions   Latex Rash and Other (See Comments)    Possible reaction to latex gloves per patient   Meclizine Other (See Comments)    Other reaction(s): Abdominal Pain, Other    VACCINATION STATUS: Immunization History  Administered Date(s) Administered   Fluad Quad(high Dose 65+) 05/07/2023   Influenza,inj,quad, With Preservative 04/03/2019   Influenza-Unspecified 05/26/2020   Moderna Sars-Covid-2 Vaccination 09/07/2019, 10/12/2019     HPI  LAROY MUSTARD is 73 y.o. male who was previously seen in 2021 for subclinical hypothyroidism and mild hypocortisolemia.  His subsequent workup did not reveal adrenal dysfunction.  After appropriate workup, he was diagnosed with hypothyroidism, without elevated thyroid  uptake and scan evidence.  He was put on methimazole  which was progressively lowered to 2.5 mg p.o. daily with breakfast with clinical and biochemical response.  He continued to respond to this intervention with normalization of his thyroid  function tests.  He has no new complaints today.  His previous thyrotoxicosis symptoms have resolved.     He presents with thyroid  function test consistent with treatment effect towards target.   He presents with progressive weight regain, lately stabilizing.    he denies dysphagia, choking, shortness of breath, no recent voice change.    he denies  family history of thyroid  dysfunction. He  denies family hx of thyroid  cancer. he denies personal history of goiter.                           Review of systems  Limited as above.   Objective:    BP (!) 94/56   Pulse 68   Ht 5' 7 (1.702 m)   Wt 238 lb 6.4 oz (108.1 kg)   BMI 37.34 kg/m   Wt Readings from Last 3 Encounters:  04/16/24 238 lb 6.4 oz (108.1 kg)  04/03/24 243 lb 1.9 oz (110.3 kg)  02/21/24 234 lb (106.1 kg)     His physical exam is negative for goiter.  CMP     Component Value Date/Time   NA 144 01/13/2024 1341   K 3.8 01/13/2024 1341   CL 105 01/13/2024 1341   CO2 26 01/13/2024 1341   GLUCOSE 107 (H) 01/13/2024 1341   GLUCOSE 100 (H) 10/09/2023 1311   BUN 8 01/13/2024 1341   CREATININE 0.89 01/13/2024 1341  CALCIUM  9.5 01/13/2024 1341   PROT 6.8 01/07/2023 1953   ALBUMIN 3.6 01/07/2023 1953   AST 15 01/07/2023 1953   ALT 12 01/07/2023 1953   ALKPHOS 68 01/07/2023 1953   BILITOT 0.4 01/07/2023 1953   GFRNONAA >60 10/09/2023 1311   GFRAA >60 02/19/2020 1622     CBC    Component Value Date/Time   WBC 4.1 01/13/2024 1341   WBC 4.1 04/19/2023 2150   RBC 4.65 01/13/2024 1341   RBC 5.01 04/19/2023 2150   HGB 13.2 01/13/2024 1341   HCT 41.6 01/13/2024 1341   PLT 213 01/13/2024 1341   MCV 90 01/13/2024 1341   MCH 28.4 01/13/2024 1341   MCH 28.3 04/19/2023 2150   MCHC 31.7 01/13/2024 1341   MCHC 31.6 04/19/2023 2150   RDW 12.8 01/13/2024 1341   LYMPHSABS 2.3 10/18/2023 1501   MONOABS 0.2 01/07/2023 1953   EOSABS 0.1 10/18/2023 1501   BASOSABS 0.0 10/18/2023 1501   Lipid Panel     Component Value Date/Time   CHOL 165 10/09/2023 1551   TRIG 139 10/09/2023 1551   HDL 45 10/09/2023 1551    CHOLHDL 3.7 10/09/2023 1551   CHOLHDL 4.4 03/02/2019 0329   VLDL 18 03/02/2019 0329   LDLCALC 95 10/09/2023 1551   LABVLDL 25 10/09/2023 1551     Lab Results  Component Value Date   TSH 3.240 04/09/2024   TSH 3.870 10/09/2023   TSH 4.130 06/06/2023   TSH 7.020 (H) 03/08/2023   TSH 8.490 (H) 01/12/2023   TSH 3.950 12/20/2022   TSH 0.119 (L) 10/06/2022   TSH 0.015 (L) 09/11/2022   TSH 0.011 (L) 09/08/2022   TSH 0.018 (L) 08/22/2022   FREET4 1.00 04/09/2024   FREET4 1.00 10/09/2023   FREET4 1.22 06/06/2023   FREET4 0.77 (L) 03/08/2023   FREET4 0.97 01/12/2023   FREET4 1.10 12/20/2022   FREET4 1.48 10/06/2022   FREET4 1.77 09/11/2022   FREET4 2.16 (H) 08/21/2022   FREET4 1.71 (H) 08/01/2022    Thyroid  uptake and scan on Nov 28, 2019 FINDINGS: There is fairly homogeneous thyroid  uptake. The left lobe extends more inferiorly than the right, although no focal hot or cold nodules are identified.   4 hour I-123 uptake = 4.1% (normal 5-20%)  24 hour I-123 uptake = 12.9% (normal 10-30%)   IMPRESSION: 1. Low normal radioactive iodine uptake by the thyroid  gland. 2. No focal hot or cold nodules identified.    Assessment & Plan:   1. Hyperthyroidism-  2.  Hyperlipidemia    3.  Prediabetes   4.  Hypervitaminosis B12 His previsit labs are consistent with treatment effect with improved thyroid  function tests.  He was given a choice of coming off of methimazole  and repeat labs in 3 months, or stay on low-dose methimazole .  She chose option 2 staying on low-dose methimazole  until next measurement in 4 months.  He is advised to continue methimazole  2.5 mg p.o. daily at breakfast.  He will return to clinic for follow-up with repeat thyroid  function tests in 4 months.   His recent adrenal assessment has indicated  sufficient adrenal response including random cortisol and ACTH  stimulation test ruling out adrenal insufficiency.  In light of his metabolic dysfunction indicated by  prediabetes, hyperlipidemia, whole food plant based diet was discussed and recommended to him.    He is hesitant to take statins, however have taken his Zetia  10 mg p.o. daily at breakfast with improved lipid panel.  He is advised to  stay on Zetia  10 mg p.o. daily at bedtime.  His most recent lipid panel showed LDL at 95.  -Patient is advised to maintain close follow up with Del Wilhelmena Lloyd Sola, FNP for primary care needs.   I spent  25  minutes in the care of the patient today including review of labs from Thyroid  Function, CMP, and other relevant labs ; imaging/biopsy records (current and previous including abstractions from other facilities); face-to-face time discussing  his lab results and symptoms, medications doses, his options of short and long term treatment based on the latest standards of care / guidelines;   and documenting the encounter.  Bert D Feuerstein  participated in the discussions, expressed understanding, and voiced agreement with the above plans.  All questions were answered to his satisfaction. he is encouraged to contact clinic should he have any questions or concerns prior to his return visit.   Follow up plan: Return in about 4 months (around 08/16/2024) for F/U with Pre-visit Labs.   Thank you for involving me in the care of this pleasant patient, and I will continue to update you with his progress.  Ranny Earl, MD [facility] Phone: [phone]  Fax: [phone]   04/16/2024, 11:02 AM  This note was partially dictated with voice recognition software. Similar sounding words can be transcribed inadequately or may not  be corrected upon review.

## 2024-04-21 ENCOUNTER — Emergency Department (HOSPITAL_COMMUNITY)
Admission: EM | Admit: 2024-04-21 | Discharge: 2024-04-21 | Disposition: A | Discharge status: Home / Self Care | Attending: Emergency Medicine | Admitting: Emergency Medicine

## 2024-04-21 ENCOUNTER — Emergency Department (HOSPITAL_COMMUNITY)

## 2024-04-21 ENCOUNTER — Other Ambulatory Visit: Payer: Self-pay

## 2024-04-21 ENCOUNTER — Encounter (HOSPITAL_COMMUNITY): Payer: Self-pay

## 2024-04-21 DIAGNOSIS — I471 Supraventricular tachycardia, unspecified: Secondary | ICD-10-CM | POA: Insufficient documentation

## 2024-04-21 DIAGNOSIS — I10 Essential (primary) hypertension: Secondary | ICD-10-CM | POA: Diagnosis not present

## 2024-04-21 DIAGNOSIS — K627 Radiation proctitis: Secondary | ICD-10-CM | POA: Diagnosis not present

## 2024-04-21 DIAGNOSIS — J069 Acute upper respiratory infection, unspecified: Secondary | ICD-10-CM

## 2024-04-21 DIAGNOSIS — I4891 Unspecified atrial fibrillation: Secondary | ICD-10-CM | POA: Diagnosis not present

## 2024-04-21 DIAGNOSIS — Z91040 Latex allergy status: Secondary | ICD-10-CM | POA: Insufficient documentation

## 2024-04-21 DIAGNOSIS — R079 Chest pain, unspecified: Secondary | ICD-10-CM | POA: Diagnosis not present

## 2024-04-21 DIAGNOSIS — Z7901 Long term (current) use of anticoagulants: Secondary | ICD-10-CM | POA: Diagnosis not present

## 2024-04-21 DIAGNOSIS — J40 Bronchitis, not specified as acute or chronic: Secondary | ICD-10-CM | POA: Insufficient documentation

## 2024-04-21 DIAGNOSIS — Z79899 Other long term (current) drug therapy: Secondary | ICD-10-CM | POA: Diagnosis not present

## 2024-04-21 DIAGNOSIS — R0602 Shortness of breath: Secondary | ICD-10-CM | POA: Diagnosis present

## 2024-04-21 DIAGNOSIS — R0789 Other chest pain: Secondary | ICD-10-CM | POA: Diagnosis present

## 2024-04-21 LAB — CBC WITH DIFFERENTIAL/PLATELET
Abs Immature Granulocytes: 0.02 K/uL (ref 0.00–0.07)
Basophils Absolute: 0 K/uL (ref 0.0–0.1)
Basophils Relative: 1 %
Eosinophils Absolute: 0.1 K/uL (ref 0.0–0.5)
Eosinophils Relative: 3 %
HCT: 46.5 % (ref 39.0–52.0)
Hemoglobin: 14.7 g/dL (ref 13.0–17.0)
Immature Granulocytes: 0 %
Lymphocytes Relative: 34 %
Lymphs Abs: 1.7 K/uL (ref 0.7–4.0)
MCH: 28.7 pg (ref 26.0–34.0)
MCHC: 31.6 g/dL (ref 30.0–36.0)
MCV: 90.6 fL (ref 80.0–100.0)
Monocytes Absolute: 0.5 K/uL (ref 0.1–1.0)
Monocytes Relative: 10 %
Neutro Abs: 2.7 K/uL (ref 1.7–7.7)
Neutrophils Relative %: 52 %
Platelets: 262 K/uL (ref 150–400)
RBC: 5.13 MIL/uL (ref 4.22–5.81)
RDW: 13.1 % (ref 11.5–15.5)
WBC: 5.2 K/uL (ref 4.0–10.5)
nRBC: 0 % (ref 0.0–0.2)

## 2024-04-21 LAB — TROPONIN I (HIGH SENSITIVITY)
Troponin I (High Sensitivity): 8 ng/L (ref <18)
Troponin I (High Sensitivity): 8 ng/L (ref <18)

## 2024-04-21 LAB — COMPREHENSIVE METABOLIC PANEL WITH GFR
ALT: 14 U/L (ref 0–44)
AST: 16 U/L (ref 15–41)
Albumin: 3.9 g/dL (ref 3.5–5.0)
Alkaline Phosphatase: 86 U/L (ref 38–126)
Anion gap: 7 (ref 5–15)
BUN: 8 mg/dL (ref 8–23)
CO2: 31 mmol/L (ref 22–32)
Calcium: 9.2 mg/dL (ref 8.9–10.3)
Chloride: 99 mmol/L (ref 98–111)
Creatinine, Ser: 0.93 mg/dL (ref 0.61–1.24)
GFR, Estimated: >60 mL/min (ref >60)
Glucose, Bld: 101 mg/dL — ABNORMAL HIGH (ref 70–99)
Potassium: 3.5 mmol/L (ref 3.5–5.1)
Sodium: 137 mmol/L (ref 135–145)
Total Bilirubin: 0.9 mg/dL (ref 0.0–1.2)
Total Protein: 7.8 g/dL (ref 6.5–8.1)

## 2024-04-21 LAB — BRAIN NATRIURETIC PEPTIDE: B Natriuretic Peptide: 27 pg/mL (ref 0.0–100.0)

## 2024-04-21 LAB — MAGNESIUM: Magnesium: 2.2 mg/dL (ref 1.7–2.4)

## 2024-04-21 MED ORDER — IPRATROPIUM-ALBUTEROL 0.5-2.5 (3) MG/3ML IN SOLN
3.0000 mL | Freq: Once | RESPIRATORY_TRACT | Status: AC
  Administered 2024-04-21: 3 mL via RESPIRATORY_TRACT
  Filled 2024-04-21: qty 3

## 2024-04-21 MED ORDER — ALBUTEROL SULFATE HFA 108 (90 BASE) MCG/ACT IN AERS: 2.0000 | INHALATION_SPRAY | RESPIRATORY_TRACT | 2 refills | Status: AC | PRN

## 2024-04-21 MED ORDER — PREDNISONE 20 MG PO TABS
40.0000 mg | ORAL_TABLET | Freq: Once | ORAL | Status: AC
  Administered 2024-04-21: 40 mg via ORAL
  Filled 2024-04-21: qty 2

## 2024-04-21 MED ORDER — PREDNISONE 20 MG PO TABS: 40.0000 mg | ORAL_TABLET | Freq: Every day | ORAL | 0 refills | Status: AC

## 2024-04-21 MED ORDER — IPRATROPIUM-ALBUTEROL 0.5-2.5 (3) MG/3ML IN SOLN: 3.0000 mL | RESPIRATORY_TRACT | 2 refills | Status: AC | PRN

## 2024-04-21 NOTE — ED Provider Notes (Signed)
 [facility] EMERGENCY DEPARTMENT AT [facility] Provider Note   CSN: [id number] Arrival date & time: 04/21/24  1726     Patient presents with: Hypertension   Steven [name] is a 73 y.o. male.   73 year old male with a history of bronchitis, SVT, atrial fibrillation/flutter on metoprolol  and Eliquis  who presents emergency department with chest discomfort.  Patient reports that he was at home and started having choking sensation in his throat.  This was at 1:30 PM.  Says that he also had some mild chest discomfort.  No palpitations.  Says that he checked his blood pressure and it was in the 170 systolic.  Says it was worse when he laid down.  Given his blood pressure decided to come in to be evaluated.  No diaphoresis or vomiting.  Discomfort does not appear to be exertional.  Back to baseline now.  Is currently on amlodipine , metoprolol , clonidine , and losartan  and has been compliant with his medications       Prior to Admission medications   Medication Sig Start Date End Date Taking? Authorizing Provider  ipratropium-albuterol  (DUONEB) 0.5-2.5 (3) MG/3ML SOLN Take 3 mLs by nebulization every 4 (four) hours as needed. 04/21/24  Yes Yolande Lamar BROCKS, MD  predniSONE  (DELTASONE ) 20 MG tablet Take 2 tablets (40 mg total) by mouth daily for 4 days. 04/21/24 04/25/24 Yes Yolande Lamar BROCKS, MD  acetaminophen  (TYLENOL ) 500 MG tablet Take 1,000 mg by mouth every 8 (eight) hours as needed for moderate pain (pain score 4-6).    [provider]  albuterol  (VENTOLIN  HFA) 108 (90 Base) MCG/ACT inhaler Inhale 2 puffs into the lungs every 4 (four) hours as needed. 04/21/24   Yolande Lamar BROCKS, MD  alfuzosin  (UROXATRAL ) 10 MG 24 hr tablet Take 1 tablet (10 mg total) by mouth daily with breakfast. 03/17/24   [name], Belvie CROME, MD  amLODipine  (NORVASC ) 10 MG tablet Take 1 tablet (10 mg total) by mouth daily. 04/27/23 04/03/24  Delford Maude BROCKS, MD  azelastine  (ASTELIN ) 0.1 % nasal spray Place  1 spray into both nostrils 2 (two) times daily. Use in each nostril as directed Patient taking differently: Place 1 spray into both nostrils 2 (two) times daily as needed (Congestion). Use in each nostril as directed 09/30/23   Stuart Vernell Norris, PA-C  azelastine  (ASTELIN ) 0.1 % nasal spray Place 1 spray into both nostrils 2 (two) times daily. Use in each nostril as directed 03/24/24   Stuart Vernell Norris, PA-C  Azelastine -Fluticasone  137-50 MCG/ACT SUSP Place 1 spray into the nose every 12 (twelve) hours. 01/24/23   Del Orbe Polanco, Iliana, FNP  benzonatate  (TESSALON ) 200 MG capsule Take 1 capsule (200 mg total) by mouth 2 (two) times daily as needed for cough. 01/01/24   Tobie Suzzane POUR, MD  benzonatate  (TESSALON ) 200 MG capsule Take 1 capsule (200 mg total) by mouth 3 (three) times daily as needed for cough. Patient not taking: Reported on 04/03/2024 03/24/24   Stuart Vernell Norris, PA-C  cephALEXin  (KEFLEX ) 500 MG capsule Take 1 capsule (500 mg total) by mouth 4 (four) times daily. Patient not taking: Reported on 04/03/2024 02/04/24   Leath-Warren, Etta PARAS, NP  cetirizine  (ZYRTEC ) 5 MG tablet Take 1 tablet (5 mg total) by mouth daily. 03/24/24   Stuart Vernell Norris, PA-C  chlorhexidine  (HIBICLENS ) 4 % external liquid Apply a dime size amount of the solution to warm water  and cleanse the affected areas twice daily while symptoms persist. 02/04/24   Leath-Warren, Etta PARAS,  NP  Cholecalciferol (VITAMIN D3) 125 MCG (5000 UT) CAPS Take 5,000 Units by mouth daily.    [provider]  cloNIDine  (CATAPRES ) 0.1 MG tablet Take 1 tablet (0.1 mg total) by mouth 2 (two) times daily as needed (SBP>160). 11/27/22   Maree, Pratik D, DO  DULoxetine  (CYMBALTA ) 60 MG capsule Take 1 capsule (60 mg total) by mouth daily. 07/12/23   Onita Duos, MD  ELIQUIS  5 MG TABS tablet TAKE 1 TABLET(5 MG) BY MOUTH TWICE DAILY 11/26/23   Waddell Danelle ORN, MD  ezetimibe  (ZETIA ) 10 MG tablet TAKE 1 TABLET(10 MG) BY MOUTH DAILY  12/24/23   Nida, Gebreselassie W, MD  furosemide  (LASIX ) 20 MG tablet Take 1 tablet (20 mg total) by mouth daily. Take with or after meal. 10/30/23   Waddell Danelle ORN, MD  gabapentin  (NEURONTIN ) 400 MG capsule Take 1 capsule (400 mg total) by mouth 3 (three) times daily. 03/27/24   Del Wilhelmena Lloyd Sola, FNP  Galcanezumab -gnlm (EMGALITY ) 120 MG/ML SOAJ Inject 1 Pen into the skin every 30 (thirty) days. 09/25/22   Rush Nest, MD  guaiFENesin -codeine  100-10 MG/5ML syrup Take 5 mLs by mouth 3 (three) times daily as needed for cough. Patient not taking: Reported on 04/03/2024 11/30/23   Tobie Suzzane POUR, MD  hydrOXYzine  (ATARAX ) 10 MG tablet Take 1 tablet (10 mg total) by mouth 3 (three) times daily as needed for anxiety. 11/26/22   Maree, Pratik D, DO  hyoscyamine  (LEVBID ) 0.375 MG 12 hr tablet Take 0.375 mg by mouth every 12 (twelve) hours as needed for cramping. 01/24/23   [provider]  ketoconazole  (NIZORAL ) 2 % cream Apply 1 Application topically daily. For 2-4 weeks 02/21/24   Del Wilhelmena Lloyd Sola, FNP  loratadine  (CLARITIN ) 10 MG tablet Take 1 tablet (10 mg total) by mouth daily. Patient taking differently: Take 10 mg by mouth daily as needed for allergies. 01/24/23   Del Orbe Polanco, Iliana, FNP  losartan  (COZAAR ) 100 MG tablet Take 100 mg by mouth daily.    [provider]  methimazole  (TAPAZOLE ) 5 MG tablet Take 0.5 tablets (2.5 mg total) by mouth daily. 04/16/24   Nida, Gebreselassie W, MD  metoprolol  tartrate (LOPRESSOR ) 25 MG tablet TAKE 1 TABLET(25 MG) BY MOUTH TWICE DAILY 01/04/24   Nishan, Peter C, MD  mirtazapine  (REMERON ) 15 MG tablet TAKE 1 TABLET(15 MG) BY MOUTH AT BEDTIME 11/15/23   Del Orbe Polanco, Iliana, FNP  NURTEC 75 MG TBDP Take 75 mg by mouth daily as needed (migraine). 08/14/22   [provider]  omeprazole  (PRILOSEC) 20 MG capsule Take 20 mg by mouth daily. 06/12/23   [provider]  ondansetron  (ZOFRAN ) 4 MG tablet Take 1 tablet (4 mg total) by  mouth every 8 (eight) hours as needed for nausea or vomiting. 03/16/23   Del Wilhelmena Lloyd, Iliana, FNP  polyethylene glycol (MIRALAX  / GLYCOLAX ) 17 g packet Take 17 g by mouth daily. 08/23/22   Ricky Fines, MD  potassium chloride  SA (KLOR-CON  M) 20 MEQ tablet Take 1 tablet (20 mEq total) by mouth daily. Patient taking differently: Take 40 mEq by mouth daily. 12/04/22   Dean Clarity, MD  promethazine -dextromethorphan  (PROMETHAZINE -DM) 6.25-15 MG/5ML syrup Take 5 mLs by mouth 4 (four) times daily as needed. 04/03/24   Bacchus, Meade PEDLAR, FNP  tiZANidine  (ZANAFLEX ) 4 MG tablet Take 1 tablet (4 mg total) by mouth every 12 (twelve) hours as needed for muscle spasms. 01/01/24   Tobie Suzzane POUR, MD  traMADol  (ULTRAM ) 50  MG tablet Take 1 tablet (50 mg total) by mouth every 12 (twelve) hours as needed. 01/01/24   Tobie Suzzane POUR, MD  triamcinolone  cream (KENALOG ) 0.1 % Apply 1 Application topically 2 (two) times daily. 06/27/23   Del Orbe Polanco, Iliana, FNP  Ubrogepant  (UBRELVY ) 100 MG TABS Take 1 tablet (100 mg total) by mouth as needed (for headaches). May repeat a dose in 2 hours if needed. Max dose 2 pills in 24 hours 09/25/22   Rush Nest, MD  vitamin B-12 (CYANOCOBALAMIN ) 100 MCG tablet Take 100 mcg by mouth daily.    [provider]  gabapentin  (NEURONTIN ) 300 MG capsule Take 1 capsule (300 mg total) by mouth 3 (three) times daily as needed. 02/19/23   Del Orbe Polanco, Iliana, FNP    Allergies: Latex and Meclizine    Review of Systems  Updated Vital Signs BP (!) 153/76   Pulse (!) 51   Temp 98.1 F (36.7 C) (Oral)   Resp 12   Ht 5' 7 (1.702 m)   Wt 108.1 kg   SpO2 96%   BMI 37.34 kg/m   Physical Exam Vitals and nursing note reviewed.  Constitutional:      General: He is not in acute distress.    Appearance: He is well-developed.  HENT:     Head: Normocephalic and atraumatic.     Right Ear: External ear normal.     Left Ear: External ear normal.     Nose: Nose normal.   Eyes:     Extraocular Movements: Extraocular movements intact.     Conjunctiva/sclera: Conjunctivae normal.     Pupils: Pupils are equal, round, and reactive to light.  Cardiovascular:     Rate and Rhythm: Normal rate and regular rhythm.     Heart sounds: Normal heart sounds.     Comments: Radial pulses 2+ bilaterally Pulmonary:     Effort: Pulmonary effort is normal. No respiratory distress.     Breath sounds: Wheezing present.  Abdominal:     General: There is no distension.     Palpations: Abdomen is soft. There is no mass.     Tenderness: There is no abdominal tenderness. There is no guarding.  Musculoskeletal:     Cervical back: Normal range of motion and neck supple.     Right lower leg: No edema.     Left lower leg: No edema.  Skin:    General: Skin is warm and dry.  Neurological:     Mental Status: He is alert. Mental status is at baseline.  Psychiatric:        Mood and Affect: Mood normal.        Behavior: Behavior normal.     (all labs ordered are listed, but only abnormal results are displayed) Labs Reviewed  COMPREHENSIVE METABOLIC PANEL WITH GFR - Abnormal; Notable for the following components:      Result Value   Glucose, Bld 101 (*)    All other components within normal limits  BRAIN NATRIURETIC PEPTIDE  CBC WITH DIFFERENTIAL/PLATELET  MAGNESIUM   TROPONIN I (HIGH SENSITIVITY)  TROPONIN I (HIGH SENSITIVITY)    EKG: EKG Interpretation Date/Time:  Monday April 21 2024 18:10:10 EDT Ventricular Rate:  54 PR Interval:  164 QRS Duration:  88 QT Interval:  426 QTC Calculation: 403 R Axis:   -19  Text Interpretation: Sinus bradycardia Otherwise normal ECG When compared with ECG of 13-Jan-2024 13:46, Criteria for Septal infarct are no longer Present Confirmed by Yolande Charleston [phone]) on  04/21/2024 6:43:31 PM  Radiology: DG Chest 2 View Result Date: 04/21/2024 CLINICAL DATA:  Chest pain and shortness of breath. EXAM: CHEST - 2 VIEW COMPARISON:   01/13/2024 FINDINGS: Lungs are somewhat hypoinflated without focal airspace consolidation or effusion. Chronic change left base. Cardiomediastinal silhouette and remainder of the exam is unchanged. IMPRESSION: Hypoinflation without acute cardiopulmonary disease. Electronically Signed   By: Toribio Agreste M.D.   On: 04/21/2024 18:43     Procedures   Medications Ordered in the ED  ipratropium-albuterol  (DUONEB) 0.5-2.5 (3) MG/3ML nebulizer solution 3 mL (3 mLs Nebulization Given 04/21/24 1848)  predniSONE  (DELTASONE ) tablet 40 mg (40 mg Oral Given 04/21/24 2214)                                    Medical Decision Making Amount and/or Complexity of Data Reviewed Labs: ordered. Radiology: ordered.  Risk Prescription drug management.   74 year old male with a history of bronchitis, SVT, atrial fibrillation/flutter on metoprolol  and Eliquis  who presents emergency department with chest discomfort.   Initial Ddx:  Upper airway obstruction, bronchitis/COPD, arrhythmia, MI, PE  MDM/Course:  Patient presented to the emergency department with chest discomfort.  Said that he had choking sensation in his upper chest and throat that was transient.  Did not notice any palpitations with it.  His blood pressure was somewhat elevated when this happened as well.  On exam not in acute distress.  Sinus bradycardia.  Satting well on room air.  Does have some expiratory wheezing.  Not currently having any chest pain.  EKG with sinus pericardia but no other ischemic changes.  Serial troponins were stable at 8 making MI highly unlikely.  Chest x-ray without any pulmonary edema or infiltrate concerning for pneumonia.  BNP WNL.  Given the transient nature of his symptoms low concern for PE.  Also is not hypoxic or tachycardic.  Could potentially of had a paroxysmal arrhythmia such as SVT.  Upon re-evaluation was feeling better after the DuoNeb.  Given prednisone  as well.  Will send home with short course of prednisone   and refilled his inhalers and DuoNebs and will have him follow-up with his primary doctor and cardiology  This patient presents to the ED for concern of complaints listed in HPI, this involves an extensive number of treatment options, and is a complaint that carries with it a high risk of complications and morbidity. Disposition including potential need for admission considered.   Dispo: DC Home. Return precautions discussed including, but not limited to, those listed in the AVS. Allowed pt time to ask questions which were answered fully prior to dc.  Additional history obtained from spouse Records reviewed Outpatient Clinic Notes The following labs were independently interpreted: Chemistry and show no acute abnormality I independently reviewed the following imaging with scope of interpretation limited to determining acute life threatening conditions related to emergency care: Chest x-ray and agree with the radiologist interpretation with the following exceptions: none I personally reviewed and interpreted cardiac monitoring: Sinus bradycardia I personally reviewed and interpreted the pt's EKG: see above for interpretation  I have reviewed the patients home medications and made adjustments as needed Social Determinants of health:  Geriatric  Portions of this note were generated with Scientist, clinical (histocompatibility and immunogenetics). Dictation errors may occur despite best attempts at proofreading.     Final diagnoses:  Chest discomfort  Bronchitis    ED Discharge Orders  Ordered    ipratropium-albuterol  (DUONEB) 0.5-2.5 (3) MG/3ML SOLN  Every 4 hours PRN        04/21/24 2149    albuterol  (VENTOLIN  HFA) 108 (90 Base) MCG/ACT inhaler  Every 4 hours PRN        04/21/24 2149    predniSONE  (DELTASONE ) 20 MG tablet  Daily        04/21/24 2149    Ambulatory referral to Cardiology        04/21/24 2150               Yolande Lamar BROCKS, MD 04/21/24 2242

## 2024-04-21 NOTE — ED Triage Notes (Signed)
 Pt arrived via POV c/o hypertension of 170's/90's for the past few days. Pt also reports intermittent sensations of feeling like he is choking, nausea, SOB and chest discomfort.

## 2024-04-21 NOTE — ED Notes (Signed)
 Patient transported to X-ray

## 2024-04-21 NOTE — ED Notes (Signed)
 ED Provider at bedside.

## 2024-04-21 NOTE — ED Notes (Signed)
 Called lab regarding pt's blood work

## 2024-04-21 NOTE — Discharge Instructions (Signed)
 You were seen for your chest discomfort and your bronchitis in the emergency department.   At home, please take the steroids and use your inhaler as needed.    Check your MyChart online for the results of any tests that had not resulted by the time you left the emergency department.   Follow-up with your primary doctor in 2-3 days regarding your visit.  Follow-up with cardiology.  I have put in the referral so they may call you about an appointment  Return immediately to the emergency department if you experience any of the following: Chest discomfort, palpitations, difficulty breathing, or any other concerning symptoms.    Thank you for visiting our Emergency Department. It was a pleasure taking care of you today.

## 2024-04-22 NOTE — Telephone Encounter (Signed)
 Patient called with no answer. Message left to return call to office concerning appointment change.

## 2024-04-25 ENCOUNTER — Other Ambulatory Visit: Payer: Self-pay | Admitting: Family Medicine

## 2024-04-25 DIAGNOSIS — B356 Tinea cruris: Secondary | ICD-10-CM

## 2024-04-28 ENCOUNTER — Other Ambulatory Visit: Payer: Self-pay | Admitting: Neurosurgery

## 2024-04-28 ENCOUNTER — Ambulatory Visit: Payer: Self-pay

## 2024-04-28 ENCOUNTER — Other Ambulatory Visit: Payer: Self-pay

## 2024-04-28 VITALS — BP 151/78 | Ht 67.0 in | Wt 230.0 lb

## 2024-04-28 DIAGNOSIS — B356 Tinea cruris: Secondary | ICD-10-CM

## 2024-04-28 DIAGNOSIS — M4802 Spinal stenosis, cervical region: Secondary | ICD-10-CM

## 2024-04-28 DIAGNOSIS — Z0000 Encounter for general adult medical examination without abnormal findings: Secondary | ICD-10-CM | POA: Diagnosis not present

## 2024-04-28 DIAGNOSIS — I4892 Unspecified atrial flutter: Secondary | ICD-10-CM

## 2024-04-28 DIAGNOSIS — I482 Chronic atrial fibrillation, unspecified: Secondary | ICD-10-CM

## 2024-04-28 DIAGNOSIS — I471 Supraventricular tachycardia, unspecified: Secondary | ICD-10-CM

## 2024-04-28 MED ORDER — KETOCONAZOLE 2 % EX CREA: TOPICAL_CREAM | Freq: Two times a day (BID) | CUTANEOUS | 3 refills | Status: AC

## 2024-04-28 MED ORDER — FUROSEMIDE 20 MG PO TABS: 20.0000 mg | ORAL_TABLET | Freq: Every day | ORAL | 1 refills | Status: AC

## 2024-04-28 NOTE — Progress Notes (Signed)
 Patient is requesting a refill on furosemide  and ketoconazole  2% cream. He needs an acute visit for blood pressure. Seen in the ED on 04/21/24 for chest pain. Told to follow up with pcp in 3 days. BP during AWV was 151/78 per home bp monitor. Please call patient with an appointment for this week.   Please attest and cosign this visit due to patients primary care provider not being immediately available at the time the visit was completed.   Subjective:   Steven [name] is a 73 y.o. who presents for a Medicare Wellness preventive visit. As a reminder, Annual Wellness Visits don't include a physical exam, and some assessments may be limited, especially if this visit is performed virtually. We may recommend an in-person follow-up visit with your provider if needed.  Visit Complete: Virtual I connected with  [name] on 04/28/24 by a audio enabled telemedicine application and verified that I am speaking with the correct person using two identifiers.  Patient Location: Home  Provider Location: Home Office  I discussed the limitations of evaluation and management by telemedicine. The patient expressed understanding and agreed to proceed.  Vital Signs: Because this visit was a virtual/telehealth visit, some criteria may be missing or patient reported. Any vitals not documented were not able to be obtained and vitals that have been documented are patient reported. VideoDeclined- This patient declined Librarian, academic. Therefore the visit was completed with audio only.  Persons Participating in Visit: Patient.  AWV Questionnaire: No: Patient Medicare AWV questionnaire was not completed prior to this visit. Cardiac Risk Factors include: advanced age (>63men, >70 women);hypertension;obesity (BMI >30kg/m2);sedentary lifestyle;male gender;dyslipidemia;Other (see comment), Risk factor comments: AFib     Objective:    Today's Vitals   04/28/24 1107 04/28/24 1120   BP: (!) 151/78   Weight: 230 lb (104.3 kg)   Height: 5' 7 (1.702 m)   PainSc:  0-No pain   Body mass index is 36.02 kg/m.    04/28/2024   11:21 AM 04/21/2024    6:06 PM 11/12/2023    1:07 PM 10/09/2023    1:05 PM 07/23/2023   10:28 AM 06/25/2023    9:58 AM 06/04/2023    1:16 PM  Advanced Directives  Does Patient Have a Medical Advance Directive? No No No No No No No  Would patient like information on creating a medical advance directive? No - Patient declined No - Patient declined No - Patient declined No - Patient declined Yes (MAU/Ambulatory/Procedural Areas - Information given) No - Patient declined No - Patient declined    Current Medications (verified) Outpatient Encounter Medications as of 04/28/2024  Medication Sig   acetaminophen  (TYLENOL ) 500 MG tablet Take 1,000 mg by mouth every 8 (eight) hours as needed for moderate pain (pain score 4-6).   albuterol  (VENTOLIN  HFA) 108 (90 Base) MCG/ACT inhaler Inhale 2 puffs into the lungs every 4 (four) hours as needed.   alfuzosin  (UROXATRAL ) 10 MG 24 hr tablet Take 1 tablet (10 mg total) by mouth daily with breakfast.   amLODipine  (NORVASC ) 10 MG tablet Take 1 tablet (10 mg total) by mouth daily.   azelastine  (ASTELIN ) 0.1 % nasal spray Place 1 spray into both nostrils 2 (two) times daily. Use in each nostril as directed (Patient taking differently: Place 1 spray into both nostrils 2 (two) times daily as needed (Congestion). Use in each nostril as directed)   azelastine  (ASTELIN ) 0.1 % nasal spray Place 1 spray into both nostrils 2 (  two) times daily. Use in each nostril as directed   cetirizine  (ZYRTEC ) 5 MG tablet Take 1 tablet (5 mg total) by mouth daily.   chlorhexidine  (HIBICLENS ) 4 % external liquid Apply a dime size amount of the solution to warm water  and cleanse the affected areas twice daily while symptoms persist.   Cholecalciferol (VITAMIN D3) 125 MCG (5000 UT) CAPS Take 5,000 Units by mouth daily.   cloNIDine  (CATAPRES ) 0.1  MG tablet Take 1 tablet (0.1 mg total) by mouth 2 (two) times daily as needed (SBP>160).   DULoxetine  (CYMBALTA ) 60 MG capsule Take 1 capsule (60 mg total) by mouth daily.   ELIQUIS  5 MG TABS tablet TAKE 1 TABLET(5 MG) BY MOUTH TWICE DAILY   ezetimibe  (ZETIA ) 10 MG tablet TAKE 1 TABLET(10 MG) BY MOUTH DAILY   furosemide  (LASIX ) 20 MG tablet Take 1 tablet (20 mg total) by mouth daily. Take with or after meal.   gabapentin  (NEURONTIN ) 400 MG capsule Take 1 capsule (400 mg total) by mouth 3 (three) times daily.   ipratropium-albuterol  (DUONEB) 0.5-2.5 (3) MG/3ML SOLN Take 3 mLs by nebulization every 4 (four) hours as needed.   ketoconazole  (NIZORAL ) 2 % cream APPLY TOPICALLY TO THE AFFECTED AREA DAILY FOR 2 TO 4 WEEKS   loratadine  (CLARITIN ) 10 MG tablet Take 1 tablet (10 mg total) by mouth daily. (Patient taking differently: Take 10 mg by mouth daily as needed for allergies.)   losartan  (COZAAR ) 100 MG tablet Take 100 mg by mouth daily.   methimazole  (TAPAZOLE ) 5 MG tablet Take 0.5 tablets (2.5 mg total) by mouth daily.   metoprolol  tartrate (LOPRESSOR ) 25 MG tablet TAKE 1 TABLET(25 MG) BY MOUTH TWICE DAILY   mirtazapine  (REMERON ) 15 MG tablet TAKE 1 TABLET(15 MG) BY MOUTH AT BEDTIME   omeprazole  (PRILOSEC) 20 MG capsule Take 20 mg by mouth daily.   ondansetron  (ZOFRAN ) 4 MG tablet Take 1 tablet (4 mg total) by mouth every 8 (eight) hours as needed for nausea or vomiting.   polyethylene glycol (MIRALAX  / GLYCOLAX ) 17 g packet Take 17 g by mouth daily.   potassium chloride  SA (KLOR-CON  M) 20 MEQ tablet Take 1 tablet (20 mEq total) by mouth daily. (Patient taking differently: Take 40 mEq by mouth daily.)   promethazine -dextromethorphan  (PROMETHAZINE -DM) 6.25-15 MG/5ML syrup Take 5 mLs by mouth 4 (four) times daily as needed.   triamcinolone  cream (KENALOG ) 0.1 % Apply 1 Application topically 2 (two) times daily.   vitamin B-12 (CYANOCOBALAMIN ) 100 MCG tablet Take 100 mcg by mouth daily.    Azelastine -Fluticasone  137-50 MCG/ACT SUSP Place 1 spray into the nose every 12 (twelve) hours. (Patient not taking: Reported on 04/28/2024)   benzonatate  (TESSALON ) 200 MG capsule Take 1 capsule (200 mg total) by mouth 3 (three) times daily as needed for cough. (Patient not taking: Reported on 04/28/2024)   Galcanezumab -gnlm (EMGALITY ) 120 MG/ML SOAJ Inject 1 Pen into the skin every 30 (thirty) days. (Patient not taking: Reported on 04/28/2024)   guaiFENesin -codeine  100-10 MG/5ML syrup Take 5 mLs by mouth 3 (three) times daily as needed for cough. (Patient not taking: Reported on 04/28/2024)   hydrOXYzine  (ATARAX ) 10 MG tablet Take 1 tablet (10 mg total) by mouth 3 (three) times daily as needed for anxiety. (Patient not taking: Reported on 04/28/2024)   hyoscyamine  (LEVBID ) 0.375 MG 12 hr tablet Take 0.375 mg by mouth every 12 (twelve) hours as needed for cramping. (Patient not taking: Reported on 04/28/2024)   NURTEC 75 MG TBDP Take 75 mg  by mouth daily as needed (migraine). (Patient not taking: Reported on 04/28/2024)   tiZANidine  (ZANAFLEX ) 4 MG tablet Take 1 tablet (4 mg total) by mouth every 12 (twelve) hours as needed for muscle spasms. (Patient not taking: Reported on 04/28/2024)   traMADol  (ULTRAM ) 50 MG tablet Take 1 tablet (50 mg total) by mouth every 12 (twelve) hours as needed. (Patient not taking: Reported on 04/28/2024)   Ubrogepant  (UBRELVY ) 100 MG TABS Take 1 tablet (100 mg total) by mouth as needed (for headaches). May repeat a dose in 2 hours if needed. Max dose 2 pills in 24 hours (Patient not taking: Reported on 04/28/2024)   [DISCONTINUED] benzonatate  (TESSALON ) 200 MG capsule Take 1 capsule (200 mg total) by mouth 2 (two) times daily as needed for cough.   [DISCONTINUED] cephALEXin  (KEFLEX ) 500 MG capsule Take 1 capsule (500 mg total) by mouth 4 (four) times daily. (Patient not taking: Reported on 04/03/2024)   [DISCONTINUED] gabapentin  (NEURONTIN ) 300 MG capsule Take 1 capsule (300 mg total)  by mouth 3 (three) times daily as needed.   No facility-administered encounter medications on file as of 04/28/2024.    Allergies (verified) Latex and Meclizine   History: Past Medical History:  Diagnosis Date   Anxiety    Arthritis    Atrial fibrillation (HCC)    Atrial fibrillation (HCC)    Atrial flutter (HCC)    BPH (benign prostatic hyperplasia)    Chest pain    Dizziness    Dyspnea    laying down occ   Dysrhythmia    Fracture 08/17/2015   MULTIPLE RIB FRACTURES     FROM FALL    GERD (gastroesophageal reflux disease)    Hemorrhoids    History of kidney stones    noted on CT scan   History of radiation therapy 08/22/11-10/13/11   prostate   Hyperlipemia    Hypertension    Hyperthyroidism    IBS (irritable bowel syndrome)    Insomnia    Light headedness    Migraine    Numbness and tingling in left arm    Numbness and tingling of both legs    Open fracture of left elbow 08/18/2015   Pre-diabetes    Prostate cancer [facility])    prostate s/p radiation Mar 2013   Rib fractures 08/17/2015   Tinnitus    Past Surgical History:  Procedure Laterality Date   BOTOX  INJECTION N/A 01/14/2020   Procedure: INJECTION OF BOTOX  INTO ANAL SPHINCTER;  Surgeon: Teresa Lonni HERO, MD;  Location: [facility];  Service: General;  Laterality: N/A;   COLONOSCOPY N/A 12/19/2012   MFM:Mzrujo 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 Normal appearing colon   COLONOSCOPY WITH PROPOFOL  N/A 10/23/2019   Procedure: COLONOSCOPY WITH PROPOFOL ;  Surgeon: Shaaron Lamar HERO, MD; external and grade 2 internal hemorrhoids, abnormal rectal blood vessels consistent with radiation proctitis s/p APC therapy, otherwise normal exam.   EVALUATION UNDER ANESTHESIA WITH ANAL FISTULECTOMY N/A 01/14/2020   Procedure: ANORECTAL EXAM UNDER ANESTHESIA;  Surgeon: Teresa Lonni HERO, MD;  Location: [facility];  Service: General;  Laterality: N/A;   HOT HEMOSTASIS  10/23/2019    Procedure: HOT HEMOSTASIS (ARGON PLASMA COAGULATION/BICAP);  Surgeon: Shaaron Lamar HERO, MD;  Location: [facility];  Service: Endoscopy;;  apc rectal proctitis     KIDNEY SURGERY  1982   kidney tube collapse repair   PROSTATE BIOPSY N/A 11/15/2023   Procedure: BIOPSY, PROSTATE;  Surgeon: Sherrilee Belvie CROME,  MD;  Location: [facility];  Service: Urology;  Laterality: N/A;   PROSTATE BIOPSY N/A 11/15/2023   Procedure: BIOPSY, PROSTATE, RECTAL APPROACH, WITH US  GUIDANCE;  Surgeon: Sherrilee Belvie CROME, MD;  Location: [facility];  Service: Urology;  Laterality: N/A;   RADIOACTIVE SEED IMPLANT     Prostate   SVT ABLATION N/A 12/12/2022   Procedure: SVT ABLATION;  Surgeon: Waddell Danelle ORN, MD;  Location: [facility];  Service: Cardiovascular;  Laterality: N/A;   TRANSRECTAL ULTRASOUND N/A 11/15/2023   Procedure: ULTRASOUND, RECTAL APPROACH;  Surgeon: Sherrilee Belvie CROME, MD;  Location: [facility];  Service: Urology;  Laterality: N/A;   Family History  Problem Relation Age of Onset   Aneurysm Mother        aortic   Hypertension Mother    Headache Mother    Prostate cancer Father    Hypertension Father    Heart failure Brother    Colon cancer Neg Hx    Colon polyps Neg Hx    Social History   Socioeconomic History   Marital status: Single    Spouse name: Not on file   Number of children: 2   Years of education: 12   Highest education level: 12th grade  Occupational History   Occupation: Custodian     Employer: [employer]  Tobacco Use   Smoking status: Former    Current packs/day: 0.00    Average packs/day: 1 pack/day for 20.0 years (20.0 ttl pk-yrs)    Types: Cigarettes    Start date: 2000    Quit date: 2020    Years since quitting: 5.7    Passive exposure: Past   Smokeless tobacco: Never   Tobacco comments:    Quit smoking x 2 years    07/2015   SOMETIIMES i USE VAPOR     Smoking Cessation Classes, Agencies, Aeronautical engineer Use   Vaping status:  Never Used  Substance and Sexual Activity   Alcohol use: Not Currently   Drug use: Never   Sexual activity: Not Currently    Partners: Female  Other Topics Concern   Not on file  Social History Narrative   Lives with friend   Divorced   No caffeine   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   Social Drivers of Corporate investment banker Strain: Low Risk  (04/28/2024)   Overall Financial Resource Strain (CARDIA)    Difficulty of Paying Living Expenses: Not hard at all  Food Insecurity: No Food Insecurity (04/28/2024)   Hunger Vital Sign    Worried About Running Out of Food in the Last Year: Never true    Ran Out of Food in the Last Year: Never true  Transportation Needs: No Transportation Needs (04/28/2024)   PRAPARE - Administrator, Civil Service (Medical): No    Lack of Transportation (Non-Medical): No  Physical Activity: Inactive (04/28/2024)   Exercise Vital Sign    Days of Exercise per Week: 0 days    Minutes of Exercise per Session: 0 min  Stress: No Stress Concern Present (04/28/2024)   [employer] of Occupational Health - Occupational Stress Questionnaire    Feeling of Stress: Only a little  Social Connections: Socially Isolated (04/28/2024)   Social Connection and Isolation Panel    Frequency of Communication with Friends and Family: More than three times a week    Frequency of Social Gatherings with Friends and Family: Once a week  Attends Religious Services: Never    Active Member of Clubs or Organizations: No    Attends Banker Meetings: Never    Marital Status: Widowed    Tobacco Counseling Counseling given: No Tobacco comments: Quit smoking x 2 years    07/2015   SOMETIIMES i USE VAPOR  Smoking Cessation Classes, Agencies, Services & Resources Offered   Clinical Intake: Pre-visit preparation completed: Yes Pain : No/denies pain Pain Score: 0-No pain   BMI - recorded: 36.02 Nutritional Status: BMI > 30   Obese Nutritional Risks: None Diabetes: No Lab Results  Component Value Date   HGBA1C 5.6 10/09/2023   HGBA1C 5.7 (H) 01/29/2023    How often do you need to have someone help you when you read instructions, pamphlets, or other written materials from your doctor or pharmacy?: 1 - Never Interpreter Needed?: No Information entered by :: Zakeria Kulzer W CMA (AAMA)  Activities of Daily Living     04/28/2024   11:21 AM 11/12/2023   12:59 PM  In your present state of health, do you have any difficulty performing the following activities:  Hearing? 0   Vision? 0   Difficulty concentrating or making decisions? 0   Walking or climbing stairs? 0   Dressing or bathing? 0   Doing errands, shopping? 0 0  Preparing Food and eating ? N   Using the Toilet? N   In the past six months, have you accidently leaked urine? N   Do you have problems with loss of bowel control? N   Managing your Medications? N   Managing your Finances? N   Housekeeping or managing your Housekeeping? N    Patient Care Team: Del Wilhelmena Falter, Hilario, FNP as PCP - General (Family Medicine) Shaaron Lamar HERO, MD as Attending Physician (Gastroenterology) Kennedy Charmaine CROME, NP as Nurse Practitioner (Gastroenterology) Dodson Delon FERNS, MD as Referring Physician (Physical Medicine and Rehabilitation) Delford Maude BROCKS, MD as Consulting Physician (Cardiology) Waddell Danelle ORN, MD as Consulting Physician (Cardiology) Ladora Ross Lacy Phebe, MD as Referring Physician (Optometry) [name], Belvie CROME, MD as Consulting Physician (Urology) Lenis Ethelle ORN, MD as Consulting Physician (Endocrinology)   I have updated your Care Teams any recent Medical Services you may have received from other providers in the past year.     Assessment:   This is a routine wellness examination for Mead.  Hearing/Vision screen Hearing Screening - Comments:: Patient denies any hearing difficulties.   Vision Screening - Comments:: Wears rx glasses - up to  date with routine eye exams with  Dr[facility] Ladora at [facility]  Goals Addressed               This Visit's Progress     I'd like to lose weight (pt-stated)          Depression Screen     04/28/2024   11:23 AM 02/21/2024    1:03 PM 01/01/2024    1:37 PM 11/30/2023    8:13 AM 10/18/2023    2:02 PM 06/28/2023   10:39 AM 06/25/2023    9:55 AM  PHQ 2/9 Scores  PHQ - 2 Score 0 1 0 0 2 1 1   PHQ- 9 Score 0 2 0 0 4 3 3      Fall Risk     04/28/2024   11:23 AM 02/21/2024    1:03 PM 01/01/2024    1:37 PM 11/30/2023    8:13 AM 10/18/2023    2:01 PM  Fall Risk  Falls in the past year? 0 0 0 0 0  Number falls in past yr: 0 0 0 0 0  Injury with Fall? 0 0 0 0 0  Risk for fall due to : No Fall Risks No Fall Risks No Fall Risks  No Fall Risks  Follow up Falls evaluation completed;Education provided;Falls prevention discussed Falls evaluation completed Falls evaluation completed Falls evaluation completed Falls evaluation completed    MEDICARE RISK AT HOME:  Medicare Risk at Home Any stairs in or around the home?: Yes If so, are there any without handrails?: Yes Home free of loose throw rugs in walkways, pet beds, electrical cords, etc?: Yes Adequate lighting in your home to reduce risk of falls?: Yes Life alert?: No Use of a cane, walker or w/c?: No Grab bars in the bathroom?: Yes Shower chair or bench in shower?: Yes Elevated toilet seat or a handicapped toilet?: Yes  TIMED UP AND GO: Was the test performed?  No  Cognitive Function: 6CIT completed        04/28/2024   11:23 AM 03/30/2023    8:49 AM  6CIT Screen  What Year? 0 points 0 points  What month? 0 points 0 points  What time? 0 points 0 points  Count back from 20 0 points 0 points  Months in reverse 0 points 0 points  Repeat phrase 0 points 2 points  Total Score 0 points 2 points    Immunizations Immunization History  Administered Date(s) Administered   Fluad Quad(high Dose 65+) 05/07/2023   Influenza,inj,quad, With  Preservative 04/03/2019   Influenza-Unspecified 05/26/2020   Moderna Sars-Covid-2 Vaccination 09/07/2019, 10/12/2019    Screening Tests Health Maintenance  Topic Date Due   Zoster Vaccines- Shingrix (1 of 2) Never done   COVID-19 Vaccine (3 - Moderna risk series) 11/09/2019   Lung Cancer Screening  06/23/2023   DTaP/Tdap/Td (1 - Tdap) 06/06/2024 (Originally 08/08/1969)   Pneumococcal Vaccine: 50+ Years (1 of 2 - PCV) 06/06/2024 (Originally 08/08/1969)   Influenza Vaccine  10/21/2024 (Originally 02/22/2024)   Medicare Annual Wellness (AWV)  04/28/2025   Colonoscopy  04/26/2030   Hepatitis C Screening  Completed   Meningococcal B Vaccine  Aged Out   Mammogram  Discontinued    Health Maintenance Health Maintenance Due  Topic Date Due   Zoster Vaccines- Shingrix (1 of 2) Never done   COVID-19 Vaccine (3 - Moderna risk series) 11/09/2019   Lung Cancer Screening  06/23/2023   Health Maintenance Items Addressed: Lung Cancer Screening deferred. Patient stated provider doesn't want to complete it right now due to cancer   Additional Screening: Vision Screening: Recommended annual ophthalmology exams for early detection of glaucoma and other disorders of the eye. Would you like a referral to an eye doctor? A list of eye care providers were provided to the patient  Dental Screening: Recommended annual dental exams for proper oral hygiene  Community Resource Referral / Chronic Care Management: CRR required this visit?  No   CCM required this visit?  No  Plan:   I have personally reviewed and noted the following in the patient's chart:   Medical and social history Use of alcohol, tobacco or illicit drugs  Current medications and supplements including opioid prescriptions. Patient is not currently taking opioid prescriptions. Functional ability and status Nutritional status Physical activity Advanced directives List of other physicians Hospitalizations, surgeries, and ER visits in  previous 12 months Vitals Screenings to include cognitive, depression, and falls Referrals and appointments  In addition,  I have reviewed and discussed with patient certain preventive protocols, quality metrics, and best practice recommendations. A written personalized care plan for preventive services as well as general preventive health recommendations were provided to patient.   Rosmery Duggin, CMA   04/28/2024   After Visit Summary: (MyChart) Due to this being a telephonic visit, the after visit summary with patients personalized plan was offered to patient via MyChart   Notes: Please refer to Routing Comments.

## 2024-04-28 NOTE — Patient Instructions (Signed)
 Steven [name],  Thank you for taking the time for your Medicare Wellness Visit. I appreciate your continued commitment to your health goals. Please review the care plan we discussed, and feel free to reach out if I can assist you further.  I've sent a message to the provider letting the know you need refills.  Someone will call with an appointment to follow up on your elevated blood pressures.   Medicare recommends these wellness visits once per year to help you and your care team stay ahead of potential health issues. These visits are designed to focus on prevention, allowing your provider to concentrate on managing your acute and chronic conditions during your regular appointments.  Please note that Annual Wellness Visits do not include a physical exam. Some assessments may be limited, especially if the visit was conducted virtually. If needed, we may recommend a separate in-person follow-up with your provider.  Wishing you excellent health and many blessings in the year to come!  -Ugochi Henzler, CMA  Ongoing Care Seeing your primary care provider every 3 to 6 months helps us  monitor your health and provide consistent, personalized care.   Recommended Screenings:  Health Maintenance  Topic Date Due   Zoster (Shingles) Vaccine (1 of 2) Never done   COVID-19 Vaccine (3 - Moderna risk series) 11/09/2019   Screening for Lung Cancer  06/23/2023   DTaP/Tdap/Td vaccine (1 - Tdap) 06/06/2024*   Pneumococcal Vaccine for age over 52 (1 of 2 - PCV) 06/06/2024*   Flu Shot  10/21/2024*   Medicare Annual Wellness Visit  04/28/2025   Colon Cancer Screening  04/26/2030   Hepatitis C Screening  Completed   Meningitis B Vaccine  Aged Out   Breast Cancer Screening  Discontinued  *Topic was postponed. The date shown is not the original due date.       04/28/2024   11:21 AM  Advanced Directives  Does Patient Have a Medical Advance Directive? No  Would patient like information on creating a medical advance  directive? No - Patient declined   Advance Care Planning is important because it: Ensures you receive medical care that aligns with your values, goals, and preferences. Provides guidance to your family and loved ones, reducing the emotional burden of decision-making during critical moments.  Vision: Annual vision screenings are recommended for early detection of glaucoma, cataracts, and diabetic retinopathy. These exams can also reveal signs of chronic conditions such as diabetes and high blood pressure.  Dental: Annual dental screenings help detect early signs of oral cancer, gum disease, and other conditions linked to overall health, including heart disease and diabetes.  Please see the attached documents for additional preventive care recommendations.

## 2024-04-30 ENCOUNTER — Encounter: Payer: Self-pay | Admitting: Internal Medicine

## 2024-04-30 ENCOUNTER — Ambulatory Visit (INDEPENDENT_AMBULATORY_CARE_PROVIDER_SITE_OTHER): Admitting: Internal Medicine

## 2024-04-30 VITALS — BP 123/71 | HR 74 | Ht 67.0 in | Wt 237.2 lb

## 2024-04-30 DIAGNOSIS — Z23 Encounter for immunization: Secondary | ICD-10-CM | POA: Diagnosis not present

## 2024-04-30 DIAGNOSIS — I10 Essential (primary) hypertension: Secondary | ICD-10-CM

## 2024-04-30 DIAGNOSIS — J209 Acute bronchitis, unspecified: Secondary | ICD-10-CM | POA: Insufficient documentation

## 2024-04-30 DIAGNOSIS — Z09 Encounter for follow-up examination after completed treatment for conditions other than malignant neoplasm: Secondary | ICD-10-CM | POA: Diagnosis not present

## 2024-04-30 DIAGNOSIS — I471 Supraventricular tachycardia, unspecified: Secondary | ICD-10-CM | POA: Diagnosis not present

## 2024-04-30 DIAGNOSIS — E876 Hypokalemia: Secondary | ICD-10-CM | POA: Diagnosis not present

## 2024-04-30 NOTE — Assessment & Plan Note (Signed)
 BP Readings from Last 1 Encounters:  04/30/24 123/71   Well-controlled with amlodipine , losartan , metoprolol  and clonidine  Would prefer to discontinue clonidine  as it can cause reflex tachycardia, advised to discuss about spironolactone with his cardiologist which can improve his hypokalemia as well Counseled for compliance with the medications Advised DASH diet and moderate exercise/walking, at least 150 mins/week

## 2024-04-30 NOTE — Assessment & Plan Note (Signed)
 Recent episode of chest discomfort likely due to SVT at home BP was WNL during ER visit and today EKG showed sinus bradycardia during ER visit, he is on metoprolol  for history of SVT and paroxysmal a flutter On Eliquis  for [facility] Followed by cardiology

## 2024-04-30 NOTE — Progress Notes (Signed)
 Established Patient Office Visit  Subjective:  Patient ID: Steven [name], male    DOB: 12-01-1950  Age: 73 y.o. MRN: [id number]  CC:  Chief Complaint  Patient presents with   Hypertension    Reports doing well, taking medications as prescribed.    HPI Steven [name] is a 73 y.o. male with past medical history of HTN, SVT, paroxysmal atrial flutter, chronic bronchitis, prostate carcinoma, allergic rhinitis and HLD who presents for f/u of recent ER visit on 04/20/24.  He went to ER with an episode of chest discomfort.  He checked his BP at home, it was in the 170 systolic.  It was worse when he laid down.  He went to ER for further evaluation.  His BP was WNL in the ER.  His chest discomfort had also resolved by the time.  He had serial troponins, which were WNL.  EKG showed sinus bradycardia without any signs of active ischemia.  His BP is WNL today.  He takes amlodipine , metoprolol , losartan  and clonidine  currently and has been compliant to his medications.  He denies any episode of chest discomfort since the ER visit.  Denies dyspnea or palpitations currently.  He reports having muscle cramps in the upper and lower extremities, which are intermittent.  Of note, he has a history of hypokalemia and has been taking Klor-Con  20 mEq twice daily instead of 40 mEq.  Past Medical History:  Diagnosis Date   Anxiety    Arthritis    Atrial fibrillation (HCC)    Atrial fibrillation (HCC)    Atrial flutter (HCC)    BPH (benign prostatic hyperplasia)    Chest pain    Dizziness    Dyspnea    laying down occ   Dysrhythmia    Fracture 08/17/2015   MULTIPLE RIB FRACTURES     FROM FALL    GERD (gastroesophageal reflux disease)    Hemorrhoids    History of kidney stones    noted on CT scan   History of radiation therapy 08/22/11-10/13/11   prostate   Hyperlipemia    Hypertension    Hyperthyroidism    IBS (irritable bowel syndrome)    Insomnia    Light headedness    Migraine    Numbness  and tingling in left arm    Numbness and tingling of both legs    Open fracture of left elbow 08/18/2015   Pre-diabetes    Prostate cancer [facility])    prostate s/p radiation Mar 2013   Rib fractures 08/17/2015   Tinnitus     Past Surgical History:  Procedure Laterality Date   BOTOX  INJECTION N/A 01/14/2020   Procedure: INJECTION OF BOTOX  INTO ANAL SPHINCTER;  Surgeon: Teresa Lonni HERO, MD;  Location: [facility];  Service: General;  Laterality: N/A;   COLONOSCOPY N/A 12/19/2012   MFM:Mzrujo 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 Normal appearing colon   COLONOSCOPY WITH PROPOFOL  N/A 10/23/2019   Procedure: COLONOSCOPY WITH PROPOFOL ;  Surgeon: Shaaron Lamar HERO, MD; external and grade 2 internal hemorrhoids, abnormal rectal blood vessels consistent with radiation proctitis s/p APC therapy, otherwise normal exam.   EVALUATION UNDER ANESTHESIA WITH ANAL FISTULECTOMY N/A 01/14/2020   Procedure: ANORECTAL EXAM UNDER ANESTHESIA;  Surgeon: Teresa Lonni HERO, MD;  Location: [facility];  Service: General;  Laterality: N/A;   HOT HEMOSTASIS  10/23/2019   Procedure: HOT HEMOSTASIS (ARGON PLASMA COAGULATION/BICAP);  Surgeon: Shaaron Lamar HERO, MD;  Location: [facility];  Service: Endoscopy;;  apc rectal proctitis     KIDNEY SURGERY  1982   kidney tube collapse repair   PROSTATE BIOPSY N/A 11/15/2023   Procedure: BIOPSY, PROSTATE;  Surgeon: Sherrilee Belvie CROME, MD;  Location: [facility];  Service: Urology;  Laterality: N/A;   PROSTATE BIOPSY N/A 11/15/2023   Procedure: BIOPSY, PROSTATE, RECTAL APPROACH, WITH US  GUIDANCE;  Surgeon: Sherrilee Belvie CROME, MD;  Location: [facility];  Service: Urology;  Laterality: N/A;   RADIOACTIVE SEED IMPLANT     Prostate   SVT ABLATION N/A 12/12/2022   Procedure: SVT ABLATION;  Surgeon: Waddell Danelle ORN, MD;  Location: [facility] INVASIVE CV LAB;  Service: Cardiovascular;  Laterality: N/A;   TRANSRECTAL ULTRASOUND N/A 11/15/2023    Procedure: ULTRASOUND, RECTAL APPROACH;  Surgeon: Sherrilee Belvie CROME, MD;  Location: [facility];  Service: Urology;  Laterality: N/A;    Family History  Problem Relation Age of Onset   Aneurysm Mother        aortic   Hypertension Mother    Headache Mother    Prostate cancer Father    Hypertension Father    Heart failure Brother    Colon cancer Neg Hx    Colon polyps Neg Hx     Social History   Socioeconomic History   Marital status: Single    Spouse name: Not on file   Number of children: 2   Years of education: 12   Highest education level: 12th grade  Occupational History   Occupation: Custodian     Employer: [employer]  Tobacco Use   Smoking status: Former    Current packs/day: 0.00    Average packs/day: 1 pack/day for 20.0 years (20.0 ttl pk-yrs)    Types: Cigarettes    Start date: 2000    Quit date: 2020    Years since quitting: 5.7    Passive exposure: Past   Smokeless tobacco: Never   Tobacco comments:    Quit smoking x 2 years    07/2015   SOMETIIMES i USE VAPOR     Smoking Cessation Classes, Agencies, Aeronautical engineer Use   Vaping status: Never Used  Substance and Sexual Activity   Alcohol use: Not Currently   Drug use: Never   Sexual activity: Not Currently    Partners: Female  Other Topics Concern   Not on file  Social History Narrative   Lives with friend   Divorced   No caffeine   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   Social Drivers of Corporate investment banker Strain: Low Risk  (04/28/2024)   Overall Financial Resource Strain (CARDIA)    Difficulty of Paying Living Expenses: Not hard at all  Food Insecurity: No Food Insecurity (04/28/2024)   Hunger Vital Sign    Worried About Running Out of Food in the Last Year: Never true    Ran Out of Food in the Last Year: Never true  Transportation Needs: No Transportation Needs (04/28/2024)   PRAPARE - Administrator, Civil Service (Medical):  No    Lack of Transportation (Non-Medical): No  Physical Activity: Inactive (04/28/2024)   Exercise Vital Sign    Days of Exercise per Week: 0 days    Minutes of Exercise per Session: 0 min  Stress: No Stress Concern Present (04/28/2024)   [employer] of Occupational Health - Occupational Stress Questionnaire    Feeling of Stress: Only a little  Social Connections:  Socially Isolated (04/28/2024)   Social Connection and Isolation Panel    Frequency of Communication with Friends and Family: More than three times a week    Frequency of Social Gatherings with Friends and Family: Once a week    Attends Religious Services: Never    Database administrator or Organizations: No    Attends Banker Meetings: Never    Marital Status: Widowed  Intimate Partner Violence: Not At Risk (04/28/2024)   Humiliation, Afraid, Rape, and Kick questionnaire    Fear of Current or Ex-Partner: No    Emotionally Abused: No    Physically Abused: No    Sexually Abused: No    Outpatient Medications Prior to Visit  Medication Sig Dispense Refill   acetaminophen  (TYLENOL ) 500 MG tablet Take 1,000 mg by mouth every 8 (eight) hours as needed for moderate pain (pain score 4-6).     albuterol  (VENTOLIN  HFA) 108 (90 Base) MCG/ACT inhaler Inhale 2 puffs into the lungs every 4 (four) hours as needed. 18 g 2   alfuzosin  (UROXATRAL ) 10 MG 24 hr tablet Take 1 tablet (10 mg total) by mouth daily with breakfast. 90 tablet 3   amLODipine  (NORVASC ) 10 MG tablet Take 1 tablet (10 mg total) by mouth daily. 90 tablet 3   azelastine  (ASTELIN ) 0.1 % nasal spray Place 1 spray into both nostrils 2 (two) times daily. Use in each nostril as directed (Patient taking differently: Place 1 spray into both nostrils 2 (two) times daily as needed (Congestion). Use in each nostril as directed) 30 mL 0   azelastine  (ASTELIN ) 0.1 % nasal spray Place 1 spray into both nostrils 2 (two) times daily. Use in each nostril as directed 30 mL 0    cetirizine  (ZYRTEC ) 5 MG tablet Take 1 tablet (5 mg total) by mouth daily. 30 tablet 2   chlorhexidine  (HIBICLENS ) 4 % external liquid Apply a dime size amount of the solution to warm water  and cleanse the affected areas twice daily while symptoms persist. 118 mL 0   Cholecalciferol (VITAMIN D3) 125 MCG (5000 UT) CAPS Take 5,000 Units by mouth daily.     cloNIDine  (CATAPRES ) 0.1 MG tablet Take 1 tablet (0.1 mg total) by mouth 2 (two) times daily as needed (SBP>160). 60 tablet 11   DULoxetine  (CYMBALTA ) 60 MG capsule Take 1 capsule (60 mg total) by mouth daily. 90 capsule 3   ELIQUIS  5 MG TABS tablet TAKE 1 TABLET(5 MG) BY MOUTH TWICE DAILY 60 tablet 5   ezetimibe  (ZETIA ) 10 MG tablet TAKE 1 TABLET(10 MG) BY MOUTH DAILY 90 tablet 1   furosemide  (LASIX ) 20 MG tablet Take 1 tablet (20 mg total) by mouth daily. Take with or after meal. 90 tablet 1   gabapentin  (NEURONTIN ) 400 MG capsule Take 1 capsule (400 mg total) by mouth 3 (three) times daily. 90 capsule 3   ipratropium-albuterol  (DUONEB) 0.5-2.5 (3) MG/3ML SOLN Take 3 mLs by nebulization every 4 (four) hours as needed. 360 mL 2   ketoconazole  (NIZORAL ) 2 % cream Apply topically 2 (two) times daily. 30 g 3   loratadine  (CLARITIN ) 10 MG tablet Take 1 tablet (10 mg total) by mouth daily. (Patient taking differently: Take 10 mg by mouth daily as needed for allergies.) 30 tablet 11   losartan  (COZAAR ) 100 MG tablet Take 100 mg by mouth daily.     methimazole  (TAPAZOLE ) 5 MG tablet Take 0.5 tablets (2.5 mg total) by mouth daily. 45 tablet 0   metoprolol  tartrate (  LOPRESSOR ) 25 MG tablet TAKE 1 TABLET(25 MG) BY MOUTH TWICE DAILY 180 tablet 1   mirtazapine  (REMERON ) 15 MG tablet TAKE 1 TABLET(15 MG) BY MOUTH AT BEDTIME 30 tablet 2   omeprazole  (PRILOSEC) 20 MG capsule Take 20 mg by mouth daily.     ondansetron  (ZOFRAN ) 4 MG tablet Take 1 tablet (4 mg total) by mouth every 8 (eight) hours as needed for nausea or vomiting. 20 tablet 1   polyethylene  glycol (MIRALAX  / GLYCOLAX ) 17 g packet Take 17 g by mouth daily. 30 each 2   potassium chloride  SA (KLOR-CON  M) 20 MEQ tablet Take 1 tablet (20 mEq total) by mouth daily. (Patient taking differently: Take 40 mEq by mouth daily.) 30 tablet 0   promethazine -dextromethorphan  (PROMETHAZINE -DM) 6.25-15 MG/5ML syrup Take 5 mLs by mouth 4 (four) times daily as needed. 118 mL 0   triamcinolone  cream (KENALOG ) 0.1 % Apply 1 Application topically 2 (two) times daily. 30 g 0   vitamin B-12 (CYANOCOBALAMIN ) 100 MCG tablet Take 100 mcg by mouth daily.     No facility-administered medications prior to visit.    Allergies  Allergen Reactions   Latex Rash and Other (See Comments)    Possible reaction to latex gloves per patient   Meclizine Other (See Comments)    Other reaction(s): Abdominal Pain, Other    ROS Review of Systems  Constitutional:  Negative for chills and fever.  HENT:  Negative for congestion and sore throat.   Eyes:  Negative for pain and discharge.  Respiratory:  Negative for cough and shortness of breath.   Cardiovascular:  Negative for chest pain and palpitations.  Gastrointestinal:  Negative for diarrhea, nausea and vomiting.  Endocrine: Negative for polydipsia and polyuria.  Genitourinary:  Negative for dysuria and hematuria.  Musculoskeletal:  Positive for arthralgias. Negative for neck pain and neck stiffness.  Skin:  Negative for rash.  Neurological:  Negative for dizziness and weakness.  Psychiatric/Behavioral:  Negative for agitation and behavioral problems.       Objective:    Physical Exam Vitals reviewed.  Constitutional:      General: He is not in acute distress.    Appearance: He is not diaphoretic.  HENT:     Head: Normocephalic and atraumatic.     Nose: Nose normal.     Mouth/Throat:     Mouth: Mucous membranes are moist.  Eyes:     General: No scleral icterus.    Extraocular Movements: Extraocular movements intact.  Cardiovascular:     Rate and  Rhythm: Normal rate and regular rhythm.     Heart sounds: Normal heart sounds. No murmur heard. Pulmonary:     Breath sounds: Normal breath sounds. No wheezing or rales.  Musculoskeletal:     Cervical back: Neck supple. No tenderness.     Right lower leg: No edema.     Left lower leg: No edema.  Skin:    General: Skin is warm.     Findings: No rash.  Neurological:     General: No focal deficit present.     Mental Status: He is alert and oriented to person, place, and time.  Psychiatric:        Mood and Affect: Mood normal.        Behavior: Behavior normal.     BP 123/71   Pulse 74   Ht 5' 7 (1.702 m)   Wt 237 lb 3.2 oz (107.6 kg)   SpO2 98%   BMI 37.15 kg/m  Wt Readings from Last 3 Encounters:  04/30/24 237 lb 3.2 oz (107.6 kg)  04/28/24 230 lb (104.3 kg)  04/21/24 238 lb 6.4 oz (108.1 kg)    Lab Results  Component Value Date   TSH 3.240 04/09/2024   Lab Results  Component Value Date   WBC 5.2 04/21/2024   HGB 14.7 04/21/2024   HCT 46.5 04/21/2024   MCV 90.6 04/21/2024   PLT 262 04/21/2024   Lab Results  Component Value Date   NA 137 04/21/2024   K 3.5 04/21/2024   CO2 31 04/21/2024   GLUCOSE 101 (H) 04/21/2024   BUN 8 04/21/2024   CREATININE 0.93 04/21/2024   BILITOT 0.9 04/21/2024   ALKPHOS 86 04/21/2024   AST 16 04/21/2024   ALT 14 04/21/2024   PROT 7.8 04/21/2024   ALBUMIN 3.9 04/21/2024   CALCIUM  9.2 04/21/2024   ANIONGAP 7 04/21/2024   EGFR 91 01/13/2024   Lab Results  Component Value Date   CHOL 165 10/09/2023   Lab Results  Component Value Date   HDL 45 10/09/2023   Lab Results  Component Value Date   LDLCALC 95 10/09/2023   Lab Results  Component Value Date   TRIG 139 10/09/2023   Lab Results  Component Value Date   CHOLHDL 3.7 10/09/2023   Lab Results  Component Value Date   HGBA1C 5.6 10/09/2023      Assessment & Plan:   Problem List Items Addressed This Visit       Cardiovascular and Mediastinum   HTN  (hypertension)   BP Readings from Last 1 Encounters:  04/30/24 123/71   Well-controlled with amlodipine , losartan , metoprolol  and clonidine  Would prefer to discontinue clonidine  as it can cause reflex tachycardia, advised to discuss about spironolactone with his cardiologist which can improve his hypokalemia as well Counseled for compliance with the medications Advised DASH diet and moderate exercise/walking, at least 150 mins/week      SVT (supraventricular tachycardia)   Recent episode of chest discomfort likely due to SVT at home BP was WNL during ER visit and today EKG showed sinus bradycardia during ER visit, he is on metoprolol  for history of SVT and paroxysmal a flutter On Eliquis  for [facility] Followed by cardiology        Respiratory   Acute bronchitis   Was treated with oral prednisone  during ER visit DuoNeb as needed for dyspnea or wheezing        Other   Hypokalemia   His muscle cramps are likely due to hypokalemia He is on diuretics currently Advised to take Klor-Con  40 mEq twice daily as prescribed      Encounter for examination following treatment at hospital - Primary   ER chart reviewed, including blood tests and EKG Chest discomfort likely due to episode of SVT, had resolved by the time he reached ER Follow-up with cardiology      Other Visit Diagnoses       Encounter for immunization       Relevant Orders   Flu vaccine HIGH DOSE PF(Fluzone Trivalent) (Completed)       No orders of the defined types were placed in this encounter.   Follow-up: Return if symptoms worsen or fail to improve.    Suzzane MARLA Blanch, MD

## 2024-04-30 NOTE — Patient Instructions (Signed)
 Please discuss with your Cardiologist to start Spironolactone instead of Clonidine .  Please take potassium 2 tablets twice daily.  Please continue to take medications as prescribed.  Please continue to follow low salt diet and ambulate as tolerated.

## 2024-04-30 NOTE — Assessment & Plan Note (Signed)
 ER chart reviewed, including blood tests and EKG Chest discomfort likely due to episode of SVT, had resolved by the time he reached ER Follow-up with cardiology

## 2024-04-30 NOTE — Assessment & Plan Note (Signed)
 His muscle cramps are likely due to hypokalemia He is on diuretics currently Advised to take Klor-Con  40 mEq twice daily as prescribed

## 2024-04-30 NOTE — Assessment & Plan Note (Signed)
 Was treated with oral prednisone  during ER visit DuoNeb as needed for dyspnea or wheezing

## 2024-05-01 NOTE — Progress Notes (Signed)
 Cardiology Office Note    Date:  05/09/2024  ID:  Steven [name], DOB 10-10-1950, MRN [id number] Cardiologist: None   EP: Steven [name]  History of Present Illness:    Steven [name] is a 73 y.o. male with past medical history of paroxysmal atrial fibrillation/flutter, SVT, hyperthyroidism, HTN, HLD and IBS who presents to the office today for Emergency Department follow-up.  He was examined by Steven Fabry, PA-C in 11/2022 following his ablation and reported that he did not feel better after the procedure and was having headaches and his blood pressure had been significantly variable, at 200/100 when checked at home. He was continued on Lopressor  25 mg twice daily and was advised to take an extra 12.5 if needed for recurrent SVT. A follow-up monitor was arranged to assess for any recurrent arrhythmias. He did see Steven [name] on 01/04/2023 and was mostly reporting GI issues at that time. His monitor [id number] showed predominantly normal sinus rhythm with an average heart rate of 58 bpm and rare PAC's and PVC's but less than 1% burden.   He presented back to the ED on 01/07/2023 for evaluation of palpitations and chest discomfort.  BNP was normal at 63 and Hs Troponin values were negative at 9 and 12. He was in normal sinus rhythm but BP was elevated at 171/78. It appears he received Hydralazine  and was discharged home.  He has GAD with behavioral health issues Frequent ER visits. 04/19/23 was d/c home after evaluation for headache  CT head normal   A year ago 10/06/22 TSH was suppressed On Tapzole and lopressor  TSH normal 04/09/24  Seen in ED 9.2025 for chest pain. R/O ECG NSR no acute changes BP up at time K runs low on replacement Klor Con 40 bid.  Primary seen and wanted to d/c clonidine  and start aldactone   Seeing Steven [name]  for elevated PSA. He has had seed implants before Most recent PSA 5.1     Studies Reviewed:   EKG: EKG is not ordered today. EKG from 01/07/2023 is reviewed and demonstrates  NSR, HR 65 with isolated TWI along Lead III which is similar to prior tracings.   Echocardiogram: 07/2022 IMPRESSIONS     1. Left ventricular ejection fraction, by estimation, is 60 to 65%. The  left ventricle has normal function. The left ventricle has no regional  wall motion abnormalities. Left ventricular diastolic parameters are  consistent with Grade I diastolic  dysfunction (impaired relaxation).   2. Right ventricular systolic function is normal. The right ventricular  size is normal. Tricuspid regurgitation signal is inadequate for assessing  PA pressure.   3. Left atrial size was mildly dilated.   4. The mitral valve is normal in structure. No evidence of mitral valve  regurgitation. No evidence of mitral stenosis.   5. The aortic valve is tricuspid. There is moderate calcification of the  aortic valve. Aortic valve regurgitation is not visualized. No aortic  stenosis is present.   6. The inferior vena cava is normal in size with greater than 50%  respiratory variability, suggesting right atrial pressure of 3 mmHg.   Comparison(s): No significant change from prior study.   NST: 08/2022   Stress ECG is negative for ischemia.   LV perfusion is normal. There is no evidence of ischemia. There is no evidence of infarction.   Left ventricular function is normal. Nuclear stress EF: 78 %.   The study is normal. The study is low risk.  SVT Ablation: 11/2022 CONCLUSIONS:  1. Sinus rhythm upon presentation.  2. The patient had dual AV nodal physiology with easily inducible classic AV nodal reentrant tachycardia, there were no other accessory pathways or arrhythmias induced  3. Successful radiofrequency modification of the slow AV nodal pathway  4. No inducible arrhythmias following ablation.  5. No early apparent complications.   Zio Patch: 12/2022 Patch Wear Time:  4 days and 3 hours (2024-06-05T16:37:45-0400 to 2024-06-09T19:39:03-399)   Patient had a min HR of 43 bpm, max  HR of 123 bpm, and avg HR of 58 bpm. Predominant underlying rhythm was Sinus Rhythm. Isolated SVEs were rare (<1.0%), SVE Couplets were rare (<1.0%), and SVE Triplets were rare (<1.0%). Isolated VEs were rare (<1.0%),  and no VE Couplets or VE Triplets were present. Ventricular Trigeminy was present.    Physical Exam:   VS:  BP 122/68   Pulse 71   Ht 5' 7 (1.702 m)   Wt 234 lb 9.6 oz (106.4 kg)   SpO2 96%   BMI 36.74 kg/m    Wt Readings from Last 3 Encounters:  05/09/24 234 lb 9.6 oz (106.4 kg)  04/30/24 237 lb 3.2 oz (107.6 kg)  04/28/24 230 lb (104.3 kg)     GEN: Well nourished, well developed male appearing in no acute distress NECK: No JVD; No carotid bruits CARDIAC: RRR, no murmurs, rubs, gallops RESPIRATORY:  Clear to auscultation without rales, wheezing or rhonchi  ABDOMEN: Appears non-distended. No obvious abdominal masses. EXTREMITIES: No clubbing or cyanosis. No pitting edema.  Distal pedal pulses are 2+ bilaterally.   Assessment and Plan:   1. Paroxysmal Atrial Fibrillation/Flutter - He was maintaining normal sinus rhythm by recent event monitor and is in NSR by examination today. Remains on Lopressor  25 mg twice daily for rate-control and he is on Eliquis  5 mg twice daily for anticoagulation. Recent CBC earlier this month showed his hemoglobin was stable at 13.3 with platelets at 197 K.  2. SVT - He did meet with Steven [name] in 10/2022 and underwent successful SVT ablation in 11/2022 with no immediate complications noted. Follow-up monitor earlier this month showed predominantly normal sinus rhythm with no episodes of SVT. - Reviewed monitor 01/05/23  results in detail with the patient today and by the description of his symptoms, I suspect he is feeling his PVC's and reviewed that this was less than 1% burden by his monitor which is benign. Continue Lopressor  25 mg twice daily. Would not further titrate given his heart rate in the low-60's. He is aware he can take an  extra half-tablet if needed.  3. HTN - His blood pressure is well-controlled  Hydralazine  changed to Amlodipine  10 mg daily. ? Causing headaches.Continue Losartan  100 mg daily and Lopressor  25 mg twice daily.    4. History of Chest Pain - He is s/p multiple stress tests with the most recent being in 08/2022 which showed no evidence of ischemia and was a low-risk study.  - At this time, his episodes of pain seem very atypical for a cardiac etiology and he has multiple GI issues. We reviewed that since his thyroid  has normalized, we could consider a Coronary CT or cardiac catheterization as previously discussed but he wishes to hold off on this for now which I think is very reasonable given his atypical symptoms.  5. Urology:  history of prostate cancer with seed implants F/U Steven [name] for elevated PSA on surveillance now   F/U GT November F/U with me in a year  Importantly he has  f/u with psychiatry  F/U in a year    Signed, Maude Emmer, MD

## 2024-05-03 ENCOUNTER — Ambulatory Visit
Admission: EM | Admit: 2024-05-03 | Discharge: 2024-05-03 | Disposition: A | Discharge status: Home / Self Care | Attending: Family Medicine | Admitting: Family Medicine

## 2024-05-03 ENCOUNTER — Encounter: Payer: Self-pay | Admitting: Emergency Medicine

## 2024-05-03 DIAGNOSIS — R300 Dysuria: Secondary | ICD-10-CM | POA: Diagnosis not present

## 2024-05-03 DIAGNOSIS — C61 Malignant neoplasm of prostate: Secondary | ICD-10-CM | POA: Diagnosis not present

## 2024-05-03 LAB — POCT URINE DIPSTICK
Bilirubin, UA: NEGATIVE
Blood, UA: NEGATIVE
Glucose, UA: NEGATIVE mg/dL
Ketones, POC UA: NEGATIVE mg/dL
Leukocytes, UA: NEGATIVE
Nitrite, UA: NEGATIVE
POC PROTEIN,UA: NEGATIVE
Spec Grav, UA: 1.02 (ref 1.010–1.025)
Urobilinogen, UA: 0.2 U/dL
pH, UA: 7 (ref 5.0–8.0)

## 2024-05-03 MED ORDER — PHENAZOPYRIDINE HCL 200 MG PO TABS: 200.0000 mg | ORAL_TABLET | Freq: Three times a day (TID) | ORAL | 0 refills | Status: AC | PRN

## 2024-05-03 NOTE — Discharge Instructions (Signed)
 I have sent over a medication called Pyridium  to see if this helps with the burning and recommend drinking plenty of fluids, emptying your bladder fully and frequently and calling your urologist first thing Monday morning.  Return sooner for significantly worsening symptoms.

## 2024-05-03 NOTE — ED Provider Notes (Signed)
 [facility]    CSN: [id number] Arrival date & time: 05/03/24  1301      History   Chief Complaint No chief complaint on file.   HPI Steven [name] is a 73 y.o. male.   Patient presenting today with 2-day history of dysuria particularly toward the end of his urine stream, slight lower abdominal discomfort.  Denies hematuria, fevers, chills, flank pain, nausea, vomiting.  Complicated past medical history to include history of kidney stones, history of prostate radiation status post recurrent prostatitis, IBS among numerous other chronic conditions.  He has been trying to get back in with his urologist for ongoing urinary symptoms and is supposed to see them next week.  So far not trying anything over-the-counter for symptoms since onset.    Past Medical History:  Diagnosis Date   Anxiety    Arthritis    Atrial fibrillation (HCC)    Atrial fibrillation (HCC)    Atrial flutter (HCC)    BPH (benign prostatic hyperplasia)    Chest pain    Dizziness    Dyspnea    laying down occ   Dysrhythmia    Fracture 08/17/2015   MULTIPLE RIB FRACTURES     FROM FALL    GERD (gastroesophageal reflux disease)    Hemorrhoids    History of kidney stones    noted on CT scan   History of radiation therapy 08/22/11-10/13/11   prostate   Hyperlipemia    Hypertension    Hyperthyroidism    IBS (irritable bowel syndrome)    Insomnia    Light headedness    Migraine    Numbness and tingling in left arm    Numbness and tingling of both legs    Open fracture of left elbow 08/18/2015   Pre-diabetes    Prostate cancer [facility])    prostate s/p radiation Mar 2013   Rib fractures 08/17/2015   Tinnitus     Patient Active Problem List   Diagnosis Date Noted   Encounter for examination following treatment at hospital 04/30/2024   Acute bronchitis 04/30/2024   Cough 04/03/2024   Tinea cruris 02/21/2024   Chronic pain of both knees 01/01/2024   Degeneration of intervertebral disc of  lumbar region with discogenic back pain and lower extremity pain 01/01/2024   Chronic bronchitis (HCC) 11/30/2023   Chronic sinusitis 11/30/2023   Elevated PSA 11/15/2023   Headache with neurologic deficit 07/12/2023   Contact dermatitis 06/27/2023   Low back pain 06/27/2023   Upper respiratory infection 06/07/2023   Inflammatory arthritis 05/23/2023   Pain of left hip 05/11/2023   Migraine, unspecified, not intractable, without status migrainosus 05/08/2023   Depression with anxiety 04/19/2023   Mixed hyperlipidemia 03/14/2023   Prediabetes 03/14/2023   Diffuse pain 02/19/2023   Hypertensive urgency 11/24/2022   Leukopenia 11/24/2022   SVT (supraventricular tachycardia) 11/24/2022   Orthostatic hypotension 11/23/2022   Chest tightness 08/22/2022   Hypokalemia 08/21/2022   Calculus of gallbladder without cholecystitis without obstruction 08/09/2022   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   Rash and nonspecific skin eruption 05/10/2022   Dysphagia 05/10/2022   Fibromyalgia 07/07/2020   Paresthesia of upper limb 03/04/2020   Carcinoma of prostate (HCC) 02/17/2020   Subacute thyroiditis 01/07/2020   Personal history of malignant neoplasm of prostate 12/12/2019   Benign prostatic hyperplasia with urinary obstruction 12/12/2019   Weak urinary stream 12/12/2019   Hyperthyroidism 11/24/2019   Unintentional weight loss 10/21/2019   IBS (irritable bowel syndrome) 10/21/2019   Dizzy  spells 09/20/2019   Intractable chronic migraine without aura 09/20/2019   Sleep apnea 09/20/2019   Psychophysiologic insomnia 09/20/2019   Rectal pain 08/21/2019   Nausea without vomiting 05/30/2019   Paroxysmal atrial flutter (HCC) 04/17/2019   Chronic atrial fibrillation (HCC) 03/02/2019   HTN (hypertension) 03/02/2019   Constipation 08/02/2017   Hemorrhoids 08/02/2017   Fall 08/18/2015   Radiation proctitis 01/30/2013   Rectal bleeding 12/10/2012   Obese 05/30/2012   Dyspnea  05/30/2012   Elevated lipids 05/30/2012   Palpitations 05/30/2012   Malignant neoplasm of prostate (HCC) 08/13/2011    Past Surgical History:  Procedure Laterality Date   BOTOX  INJECTION N/A 01/14/2020   Procedure: INJECTION OF BOTOX  INTO ANAL SPHINCTER;  Surgeon: Teresa Lonni HERO, MD;  Location: [facility];  Service: General;  Laterality: N/A;   COLONOSCOPY N/A 12/19/2012   MFM:Mzrujo 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 Normal appearing colon   COLONOSCOPY WITH PROPOFOL  N/A 10/23/2019   Procedure: COLONOSCOPY WITH PROPOFOL ;  Surgeon: Shaaron Lamar HERO, MD; external and grade 2 internal hemorrhoids, abnormal rectal blood vessels consistent with radiation proctitis s/p APC therapy, otherwise normal exam.   EVALUATION UNDER ANESTHESIA WITH ANAL FISTULECTOMY N/A 01/14/2020   Procedure: ANORECTAL EXAM UNDER ANESTHESIA;  Surgeon: Teresa Lonni HERO, MD;  Location: [facility];  Service: General;  Laterality: N/A;   HOT HEMOSTASIS  10/23/2019   Procedure: HOT HEMOSTASIS (ARGON PLASMA COAGULATION/BICAP);  Surgeon: Shaaron Lamar HERO, MD;  Location: [facility];  Service: Endoscopy;;  apc rectal proctitis     KIDNEY SURGERY  1982   kidney tube collapse repair   PROSTATE BIOPSY N/A 11/15/2023   Procedure: BIOPSY, PROSTATE;  Surgeon: Sherrilee Belvie CROME, MD;  Location: [facility];  Service: Urology;  Laterality: N/A;   PROSTATE BIOPSY N/A 11/15/2023   Procedure: BIOPSY, PROSTATE, RECTAL APPROACH, WITH US  GUIDANCE;  Surgeon: Sherrilee Belvie CROME, MD;  Location: [facility];  Service: Urology;  Laterality: N/A;   RADIOACTIVE SEED IMPLANT     Prostate   SVT ABLATION N/A 12/12/2022   Procedure: SVT ABLATION;  Surgeon: Waddell Danelle ORN, MD;  Location: [facility] CV [facility];  Service: Cardiovascular;  Laterality: N/A;   TRANSRECTAL ULTRASOUND N/A 11/15/2023   Procedure: ULTRASOUND, RECTAL APPROACH;  Surgeon: Sherrilee Belvie CROME, MD;  Location: [facility];  Service:  Urology;  Laterality: N/A;       Home Medications    Prior to Admission medications   Medication Sig Start Date End Date Taking? Authorizing Provider  phenazopyridine  (PYRIDIUM ) 200 MG tablet Take 1 tablet (200 mg total) by mouth 3 (three) times daily as needed for pain. 05/03/24  Yes Stuart Vernell Norris, PA-C  acetaminophen  (TYLENOL ) 500 MG tablet Take 1,000 mg by mouth every 8 (eight) hours as needed for moderate pain (pain score 4-6).    [provider]  albuterol  (VENTOLIN  HFA) 108 (90 Base) MCG/ACT inhaler Inhale 2 puffs into the lungs every 4 (four) hours as needed. 04/21/24   Yolande Lamar BROCKS, MD  alfuzosin  (UROXATRAL ) 10 MG 24 hr tablet Take 1 tablet (10 mg total) by mouth daily with breakfast. 03/17/24   [name], Belvie CROME, MD  amLODipine  (NORVASC ) 10 MG tablet Take 1 tablet (10 mg total) by mouth daily. 04/27/23 04/30/24  Nishan, Peter C, MD  azelastine  (ASTELIN ) 0.1 % nasal spray Place 1 spray into both nostrils 2 (two) times daily. Use in each nostril as directed Patient taking differently: Place 1 spray into both  nostrils 2 (two) times daily as needed (Congestion). Use in each nostril as directed 09/30/23   Stuart Vernell Norris, PA-C  azelastine  (ASTELIN ) 0.1 % nasal spray Place 1 spray into both nostrils 2 (two) times daily. Use in each nostril as directed 03/24/24   Stuart Vernell Norris, PA-C  cetirizine  (ZYRTEC ) 5 MG tablet Take 1 tablet (5 mg total) by mouth daily. 03/24/24   Stuart Vernell Norris, PA-C  chlorhexidine  (HIBICLENS ) 4 % external liquid Apply a dime size amount of the solution to warm water  and cleanse the affected areas twice daily while symptoms persist. 02/04/24   Leath-Warren, Etta PARAS, NP  Cholecalciferol (VITAMIN D3) 125 MCG (5000 UT) CAPS Take 5,000 Units by mouth daily.    [provider]  cloNIDine  (CATAPRES ) 0.1 MG tablet Take 1 tablet (0.1 mg total) by mouth 2 (two) times daily as needed (SBP>160). 11/27/22   Maree, Pratik D, DO   DULoxetine  (CYMBALTA ) 60 MG capsule Take 1 capsule (60 mg total) by mouth daily. 07/12/23   Onita Duos, MD  ELIQUIS  5 MG TABS tablet TAKE 1 TABLET(5 MG) BY MOUTH TWICE DAILY 11/26/23   Waddell Danelle ORN, MD  ezetimibe  (ZETIA ) 10 MG tablet TAKE 1 TABLET(10 MG) BY MOUTH DAILY 12/24/23   Nida, Gebreselassie W, MD  furosemide  (LASIX ) 20 MG tablet Take 1 tablet (20 mg total) by mouth daily. Take with or after meal. 04/28/24   Bevely Doffing, FNP  gabapentin  (NEURONTIN ) 400 MG capsule Take 1 capsule (400 mg total) by mouth 3 (three) times daily. 03/27/24   Del Wilhelmena Lloyd Sola, FNP  ipratropium-albuterol  (DUONEB) 0.5-2.5 (3) MG/3ML SOLN Take 3 mLs by nebulization every 4 (four) hours as needed. 04/21/24   Yolande Lamar BROCKS, MD  ketoconazole  (NIZORAL ) 2 % cream Apply topically 2 (two) times daily. 04/28/24   Bevely Doffing, FNP  loratadine  (CLARITIN ) 10 MG tablet Take 1 tablet (10 mg total) by mouth daily. Patient taking differently: Take 10 mg by mouth daily as needed for allergies. 01/24/23   Del Orbe Polanco, Iliana, FNP  losartan  (COZAAR ) 100 MG tablet Take 100 mg by mouth daily.    [provider]  methimazole  (TAPAZOLE ) 5 MG tablet Take 0.5 tablets (2.5 mg total) by mouth daily. 04/16/24   Nida, Gebreselassie W, MD  metoprolol  tartrate (LOPRESSOR ) 25 MG tablet TAKE 1 TABLET(25 MG) BY MOUTH TWICE DAILY 01/04/24   Nishan, Peter C, MD  mirtazapine  (REMERON ) 15 MG tablet TAKE 1 TABLET(15 MG) BY MOUTH AT BEDTIME 11/15/23   Del Orbe Polanco, Iliana, FNP  omeprazole  (PRILOSEC) 20 MG capsule Take 20 mg by mouth daily. 06/12/23   [provider]  ondansetron  (ZOFRAN ) 4 MG tablet Take 1 tablet (4 mg total) by mouth every 8 (eight) hours as needed for nausea or vomiting. 03/16/23   Del Wilhelmena Lloyd, Iliana, FNP  polyethylene glycol (MIRALAX  / GLYCOLAX ) 17 g packet Take 17 g by mouth daily. 08/23/22   Ricky Fines, MD  potassium chloride  SA (KLOR-CON  M) 20 MEQ tablet Take 1 tablet (20 mEq total) by mouth  daily. Patient taking differently: Take 40 mEq by mouth daily. 12/04/22   Dean Clarity, MD  promethazine -dextromethorphan  (PROMETHAZINE -DM) 6.25-15 MG/5ML syrup Take 5 mLs by mouth 4 (four) times daily as needed. 04/03/24   Bacchus, Gloria Z, FNP  triamcinolone  cream (KENALOG ) 0.1 % Apply 1 Application topically 2 (two) times daily. 06/27/23   Del Orbe Polanco, Iliana, FNP  vitamin B-12 (CYANOCOBALAMIN ) 100 MCG tablet Take 100 mcg by mouth daily.  [provider]  gabapentin  (NEURONTIN ) 300 MG capsule Take 1 capsule (300 mg total) by mouth 3 (three) times daily as needed. 02/19/23   Del Wilhelmena Lloyd Sola, FNP    Family History Family History  Problem Relation Age of Onset   Aneurysm Mother        aortic   Hypertension Mother    Headache Mother    Prostate cancer Father    Hypertension Father    Heart failure Brother    Colon cancer Neg Hx    Colon polyps Neg Hx     Social History Social History   Tobacco Use   Smoking status: Former    Current packs/day: 0.00    Average packs/day: 1 pack/day for 20.0 years (20.0 ttl pk-yrs)    Types: Cigarettes    Start date: 2000    Quit date: 2020    Years since quitting: 5.7    Passive exposure: Past   Smokeless tobacco: Never   Tobacco comments:    Quit smoking x 2 years    07/2015   SOMETIIMES i USE VAPOR     Smoking Cessation Classes, Agencies, Services & Resources Offered  Vaping Use   Vaping status: Never Used  Substance Use Topics   Alcohol use: Not Currently   Drug use: Never     Allergies   Latex and Meclizine   Review of Systems Review of Systems Per HPI  Physical Exam Triage Vital Signs ED Triage Vitals  Encounter Vitals Group     BP 05/03/24 1341 128/71     Girls Systolic BP Percentile --      Girls Diastolic BP Percentile --      Boys Systolic BP Percentile --      Boys Diastolic BP Percentile --      Pulse Rate 05/03/24 1341 64     Resp 05/03/24 1341 16     Temp 05/03/24 1341 98.7 F (37.1  C)     Temp Source 05/03/24 1341 Oral     SpO2 05/03/24 1341 93 %     Weight --      Height --      Head Circumference --      Peak Flow --      Pain Score 05/03/24 1342 1     Pain Loc --      Pain Education --      Exclude from Growth Chart --    No data found.  Updated Vital Signs BP 128/71 (BP Location: Right Arm)   Pulse 64   Temp 98.7 F (37.1 C) (Oral)   Resp 16   SpO2 93%   Visual Acuity Right Eye Distance:   Left Eye Distance:   Bilateral Distance:    Right Eye Near:   Left Eye Near:    Bilateral Near:     Physical Exam Vitals and nursing note reviewed.  Constitutional:      Appearance: Normal appearance.  HENT:     Head: Atraumatic.  Eyes:     Extraocular Movements: Extraocular movements intact.     Conjunctiva/sclera: Conjunctivae normal.  Cardiovascular:     Rate and Rhythm: Normal rate.  Pulmonary:     Effort: Pulmonary effort is normal.  Abdominal:     General: Bowel sounds are normal. There is no distension.     Palpations: Abdomen is soft.     Tenderness: There is no abdominal tenderness. There is no right CVA tenderness, left CVA tenderness or guarding.  Musculoskeletal:  General: Normal range of motion.     Cervical back: Normal range of motion and neck supple.  Skin:    General: Skin is warm and dry.  Neurological:     Mental Status: He is oriented to person, place, and time.  Psychiatric:        Mood and Affect: Mood normal.        Thought Content: Thought content normal.        Judgment: Judgment normal.      UC Treatments / Results  Labs (all labs ordered are listed, but only abnormal results are displayed) Labs Reviewed  POCT URINE DIPSTICK    EKG   Radiology No results found.  Procedures Procedures (including critical care time)  Medications Ordered in UC Medications - No data to display  Initial Impression / Assessment and Plan / UC Course  I have reviewed the triage vital signs and the nursing  notes.  Pertinent labs & imaging results that were available during my care of the patient were reviewed by me and considered in my medical decision making (see chart for details).     Urinalysis today without any abnormalities, discussed to push fluids, trial Pyridium  as needed to see if this provides any relief, and call urologist first thing Monday morning for follow-up.  Return sooner for worsening symptoms Final Clinical Impressions(s) / UC Diagnoses   Final diagnoses:  Dysuria  Recurrent prostate cancer [facility])     Discharge Instructions      I have sent over a medication called Pyridium  to see if this helps with the burning and recommend drinking plenty of fluids, emptying your bladder fully and frequently and calling your urologist first thing Monday morning.  Return sooner for significantly worsening symptoms.    ED Prescriptions     Medication Sig Dispense Auth. Provider   phenazopyridine  (PYRIDIUM ) 200 MG tablet Take 1 tablet (200 mg total) by mouth 3 (three) times daily as needed for pain. 6 tablet Stuart Vernell Norris, [place]      PDMP not reviewed this encounter.   Stuart Vernell Norris, [place] 05/03/24 1426

## 2024-05-03 NOTE — ED Triage Notes (Signed)
 Burning on urination x 2 days.  States feels some lower ABD pressure

## 2024-05-09 ENCOUNTER — Encounter: Payer: Self-pay | Admitting: Cardiovascular Disease

## 2024-05-09 ENCOUNTER — Ambulatory Visit: Admitting: Urology

## 2024-05-09 ENCOUNTER — Ambulatory Visit: Attending: Cardiovascular Disease | Admitting: Cardiovascular Disease

## 2024-05-09 VITALS — BP 122/68 | HR 71 | Ht 67.0 in | Wt 234.6 lb

## 2024-05-09 DIAGNOSIS — R079 Chest pain, unspecified: Secondary | ICD-10-CM

## 2024-05-09 DIAGNOSIS — E0590 Thyrotoxicosis, unspecified without thyrotoxic crisis or storm: Secondary | ICD-10-CM

## 2024-05-09 DIAGNOSIS — I471 Supraventricular tachycardia, unspecified: Secondary | ICD-10-CM

## 2024-05-09 DIAGNOSIS — I4892 Unspecified atrial flutter: Secondary | ICD-10-CM

## 2024-05-09 NOTE — Patient Instructions (Signed)
 Medication Instructions:  Your physician recommends that you continue on your current medications as directed. Please refer to the Current Medication list given to you today.   Labwork: None today  Testing/Procedures: None today  Follow-Up: 1 year  Any Other Special Instructions Will Be Listed Below (If Applicable).  If you need a refill on your cardiac medications before your next appointment, please call your pharmacy.

## 2024-05-16 ENCOUNTER — Telehealth: Payer: Self-pay | Admitting: Family Medicine

## 2024-05-16 NOTE — Telephone Encounter (Signed)
 Copied from CRM [id number]. Topic: Clinical - Medication Refill >> May 16, 2024 11:53 AM Avram MATSU wrote: Medication: losartan  (COZAAR ) 100 MG tablet [[id number]]  Has the patient contacted their pharmacy? Yes (Agent: If no, request that the patient contact the pharmacy for the refill. If patient does not wish to contact the pharmacy document the reason why and proceed with request.) (Agent: If yes, when and what did the pharmacy advise?)  This is the patient's preferred pharmacy:  [id number] - [place], [place] - [address] AT SEC [address] [place] [address] Phone: [phone] Fax: [phone]    Is this the correct pharmacy for this prescription? Yes If no, delete pharmacy and type the correct one.   Has the prescription been filled recently? No  Is the patient out of the medication? No  Has the patient been seen for an appointment in the last year OR does the patient have an upcoming appointment? Yes  Can we respond through MyChart? Yes  Agent: Please be advised that Rx refills may take up to 3 business days. We ask that you follow-up with your pharmacy.

## 2024-05-22 ENCOUNTER — Ambulatory Visit

## 2024-05-22 VITALS — BP 112/69 | HR 98 | Ht 67.0 in | Wt 229.1 lb

## 2024-05-22 DIAGNOSIS — J301 Allergic rhinitis due to pollen: Secondary | ICD-10-CM | POA: Diagnosis not present

## 2024-05-22 DIAGNOSIS — I10 Essential (primary) hypertension: Secondary | ICD-10-CM

## 2024-05-22 DIAGNOSIS — G4712 Idiopathic hypersomnia without long sleep time: Secondary | ICD-10-CM

## 2024-05-22 DIAGNOSIS — G4700 Insomnia, unspecified: Secondary | ICD-10-CM

## 2024-05-22 MED ORDER — LOSARTAN POTASSIUM 100 MG PO TABS: 100.0000 mg | ORAL_TABLET | Freq: Every day | ORAL | 1 refills | Status: AC

## 2024-05-22 NOTE — Progress Notes (Signed)
 Established Patient Office Visit  Subjective   Patient ID: Steven [name], male    DOB: 1951-03-06  Age: 73 y.o. MRN: [id number]  Chief Complaint  Patient presents with   Medical Management of Chronic Issues    3 month follow up      HPI Discussed the use of AI scribe software for clinical note transcription with the patient, who gave verbal consent to proceed.  History of Present Illness   Steven [name] is a 73 year old male who presents with sleep disturbances and nasal congestion.  Sleep disturbance - Frequent nocturnal awakenings characterized by gasping for air - Observed by others to 'jump in his sleep' - No prior sleep study performed - Currently takes mirtazapine , titrated from 1.5 mg to 15 mg, with partial improvement in sleep quality - Nocturia, waking to urinate one to three times per night  Nasal congestion - Persistent nasal congestion for an extended duration - Uses a nasal spray but finds it costly and is considering alternatives - Trial of Claritin  without significant relief - Uses Mucinex  and increases water  intake to alleviate symptoms  Hypertension - History of elevated blood pressure - Currently awaiting a refill of antihypertensive medication      Patient Active Problem List   Diagnosis Date Noted   Hypersomnia without long sleep time, idiopathic 05/30/2024   Allergic rhinitis 05/30/2024   Encounter for examination following treatment at hospital 04/30/2024   Acute bronchitis 04/30/2024   Cough 04/03/2024   Tinea cruris 02/21/2024   Chronic pain of both knees 01/01/2024   Degeneration of intervertebral disc of lumbar region with discogenic back pain and lower extremity pain 01/01/2024   Chronic bronchitis (HCC) 11/30/2023   Chronic sinusitis 11/30/2023   Elevated PSA 11/15/2023   Headache with neurologic deficit 07/12/2023   Contact dermatitis 06/27/2023   Low back pain 06/27/2023   Upper respiratory infection 06/07/2023   Inflammatory  arthritis 05/23/2023   Pain of left hip 05/11/2023   Migraine, unspecified, not intractable, without status migrainosus 05/08/2023   Depression with anxiety 04/19/2023   Mixed hyperlipidemia 03/14/2023   Prediabetes 03/14/2023   Diffuse pain 02/19/2023   Hypertensive urgency 11/24/2022   Leukopenia 11/24/2022   SVT (supraventricular tachycardia) 11/24/2022   Orthostatic hypotension 11/23/2022   Chest tightness 08/22/2022   Hypokalemia 08/21/2022   Calculus of gallbladder without cholecystitis without obstruction 08/09/2022   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   Rash and nonspecific skin eruption 05/10/2022   Dysphagia 05/10/2022   Fibromyalgia 07/07/2020   Paresthesia of upper limb 03/04/2020   Carcinoma of prostate (HCC) 02/17/2020   Subacute thyroiditis 01/07/2020   Personal history of malignant neoplasm of prostate 12/12/2019   Benign prostatic hyperplasia with urinary obstruction 12/12/2019   Weak urinary stream 12/12/2019   Hyperthyroidism 11/24/2019   Unintentional weight loss 10/21/2019   IBS (irritable bowel syndrome) 10/21/2019   Dizzy spells 09/20/2019   Intractable chronic migraine without aura 09/20/2019   Sleep apnea 09/20/2019   Insomnia 09/20/2019   Rectal pain 08/21/2019   Nausea without vomiting 05/30/2019   Paroxysmal atrial flutter (HCC) 04/17/2019   Chronic atrial fibrillation (HCC) 03/02/2019   HTN (hypertension) 03/02/2019   Constipation 08/02/2017   Hemorrhoids 08/02/2017   Fall 08/18/2015   Radiation proctitis 01/30/2013   Rectal bleeding 12/10/2012   Obese 05/30/2012   Dyspnea 05/30/2012   Elevated lipids 05/30/2012   Palpitations 05/30/2012   Malignant neoplasm of prostate (HCC) 08/13/2011    ROS    Objective:  BP 112/69   Pulse 98   Ht 5' 7 (1.702 m)   Wt 229 lb 1.3 oz (103.9 kg)   SpO2 90%   BMI 35.88 kg/m  BP Readings from Last 3 Encounters:  05/27/24 (!) 143/81  05/22/24 112/69  05/09/24 122/68   Wt  Readings from Last 3 Encounters:  05/27/24 232 lb (105.2 kg)  05/22/24 229 lb 1.3 oz (103.9 kg)  05/09/24 234 lb 9.6 oz (106.4 kg)     Physical Exam Vitals and nursing note reviewed.  Constitutional:      Appearance: Normal appearance.  HENT:     Head: Normocephalic.  Eyes:     Extraocular Movements: Extraocular movements intact.     Pupils: Pupils are equal, round, and reactive to light.  Cardiovascular:     Rate and Rhythm: Normal rate and regular rhythm.  Pulmonary:     Effort: Pulmonary effort is normal.     Breath sounds: Normal breath sounds.  Musculoskeletal:     Cervical back: Normal range of motion and neck supple.  Neurological:     Mental Status: He is alert and oriented to person, place, and time.  Psychiatric:        Mood and Affect: Mood normal.        Thought Content: Thought content normal.      No results found for any visits on 05/22/24.    The 10-year ASCVD risk score (Arnett DK, et al., 2019) is: 23.8%    Assessment & Plan:   Problem List Items Addressed This Visit       Cardiovascular and Mediastinum   HTN (hypertension) - Primary   BP Readings from Last 1 Encounters:  05/27/24 (!) 143/81   Elevated today, but he has not had medications this morning.  Counseled for compliance with the medications Advised DASH diet and moderate exercise/walking, at least 150 mins/week      Relevant Medications   losartan  (COZAAR ) 100 MG tablet     Respiratory   Allergic rhinitis (Chronic)   Persistent nasal congestion. Claritin  ineffective. - Recommend Zyrtec  or Allegra for allergy relief. - Advise consistent use of allergy medications.        Other   Insomnia   Stable with mirtazapine  15 mg.  No medication changes made today.       Relevant Orders   Ambulatory referral to Pulmonology   Hypersomnia without long sleep time, idiopathic   Suspected sleep apnea with nocturnal gasping and insomnia. Mirtazapine  15 mg provides inconsistent relief.  No prior sleep study. - Refer to pulmonary group for sleep study evaluation, possibly home study based on insurance.      Relevant Orders   Ambulatory referral to Pulmonology    No follow-ups on file.    Leita Longs, FNP

## 2024-05-24 ENCOUNTER — Other Ambulatory Visit: Payer: Self-pay | Admitting: Internal Medicine

## 2024-05-26 NOTE — Telephone Encounter (Signed)
 Pt last saw Dr Delford 05/09/24, last labs 04/21/24 Creat 0.93, age 73, weight 103.9kg, based on specified criteria pt is on appropriate dosage of Eliquis  5mg  BID for aflutter.  Will refill rx.

## 2024-05-27 ENCOUNTER — Ambulatory Visit (INDEPENDENT_AMBULATORY_CARE_PROVIDER_SITE_OTHER)

## 2024-05-27 ENCOUNTER — Encounter (HOSPITAL_BASED_OUTPATIENT_CLINIC_OR_DEPARTMENT_OTHER): Payer: Self-pay

## 2024-05-27 VITALS — BP 143/81 | HR 79 | Ht 67.0 in | Wt 232.0 lb

## 2024-05-27 DIAGNOSIS — G4719 Other hypersomnia: Secondary | ICD-10-CM | POA: Diagnosis not present

## 2024-05-27 DIAGNOSIS — J42 Unspecified chronic bronchitis: Secondary | ICD-10-CM | POA: Diagnosis not present

## 2024-05-27 NOTE — Patient Instructions (Signed)
 Complete home sleep test; ordered today.  Complete PFTs; may have done at [facility] or at [facility] based on patient preference.  Follow up in 8-10 weeks for sleep study and PFT results review.  Continue Albuterol  or Duonebs for shortness of breath as needed.  Follow sleep hygiene; see attached.

## 2024-05-27 NOTE — Progress Notes (Signed)
 @Patient  ID: Steven [name], male    DOB: Jul 11, 1951, 73 y.o.   MRN: [id number]  Chief Complaint  Patient presents with   Establish Care    New Sleep      Referring provider: Del Orbe Polanco, Ilian*  HPI: Discussed the use of AI scribe software for clinical note transcription with the patient, who gave verbal consent to proceed.  History of Present Illness Steven [name] is a 73 year old male with chronic bronchitis who presents with sleep disturbances.  He experiences significant sleep disturbances, including nightmares, sleep talking, and performing actions such as 'looking for keys' while asleep. His fiance has observed loud snoring and episodes of jumping in his sleep. No witnessed apneas, but he occasionally wakes up coughing or gasping for breath. He typically goes to bed between 11 PM and 12 AM and wakes up in the afternoon, often feeling unrested and requiring naps during the day.  He has a history of chronic bronchitis and reports ongoing congestion, which he attributes to allergies and environmental factors. He has received prednisone  shots in the past and currently uses a nasal spray (Astelin ) and a portable nebulizer with Duonebs, though he does not find these treatments particularly effective. He has not had a recent pulmonary function test but recalls using an incentive spirometer during a past hospital stay. He experiences shortness of breath with activity, which he attributes to his respiratory issues.  His past medical history includes atrial flutter, for which he underwent cardiac ablation in May of the previous year. He also has a history of high blood pressure and was treated for prostate cancer. He quit smoking five years ago after a 20-year history of smoking.  He has a history of IBS and thyroid  issues, which he believes contribute to fluctuations in his weight. His weight recently dropped to 170 pounds, though he does not perceive himself as eating excessively. No  palpitations or heart rhythm problems currently. No history of heart attacks or strokes.     TEST/EVENTS : PET scan 03/27/2024:  IMPRESSION: 1. Intense radiotracer activity within the prostate gland consistent with recurrent primary prostate adenocarcinoma. 2. No evidence of metastatic adenopathy in the pelvis or periaortic retroperitoneum. 3. No evidence of visceral metastasis. 4. Single focus of radiotracer activity in the anterior LEFT rib with subtle sclerotic lesion on CT portion exam. Differential includes prostate cancer skeletal metastasis versus benign fibro-osseous lesion or posttraumatic site. 5. Mild dilatation of the LEFT ureter with ectopic insertion.  Allergies  Allergen Reactions   Latex Rash and Other (See Comments)    Possible reaction to latex gloves per patient   Meclizine Other (See Comments)    Other reaction(s): Abdominal Pain, Other    Immunization History  Administered Date(s) Administered   Fluad Quad(high Dose 65+) 05/07/2023   INFLUENZA, HIGH DOSE SEASONAL PF 04/30/2024   Influenza,inj,quad, With Preservative 04/03/2019   Influenza-Unspecified 05/26/2020   Moderna Sars-Covid-2 Vaccination 09/07/2019, 10/12/2019    Past Medical History:  Diagnosis Date   Anxiety    Arthritis    Atrial fibrillation (HCC)    Atrial fibrillation (HCC)    Atrial flutter (HCC)    BPH (benign prostatic hyperplasia)    Chest pain    Dizziness    Dyspnea    laying down occ   Dysrhythmia    Fracture 08/17/2015   MULTIPLE RIB FRACTURES     FROM FALL    GERD (gastroesophageal reflux disease)    Hemorrhoids    History of  kidney stones    noted on CT scan   History of radiation therapy 08/22/11-10/13/11   prostate   Hyperlipemia    Hypertension    Hyperthyroidism    IBS (irritable bowel syndrome)    Insomnia    Light headedness    Migraine    Numbness and tingling in left arm    Numbness and tingling of both legs    Open fracture of left elbow 08/18/2015    Pre-diabetes    Prostate cancer [facility])    prostate s/p radiation Mar 2013   Rib fractures 08/17/2015   Tinnitus     Tobacco History: Social History   Tobacco Use  Smoking Status Former   Current packs/day: 0.00   Average packs/day: 1 pack/day for 20.0 years (20.0 ttl pk-yrs)   Types: Cigarettes   Start date: 2000   Quit date: 2020   Years since quitting: 5.8   Passive exposure: Past  Smokeless Tobacco Never  Tobacco Comments   Quit smoking x 2 years    07/2015   SOMETIIMES i USE VAPOR    Smoking Cessation Classes, Agencies, Public Librarian   Counseling given: Not Answered Tobacco comments: Quit smoking x 2 years    07/2015   SOMETIIMES i USE VAPOR  Smoking Cessation Classes, Agencies, Services & Resources Offered   Outpatient Medications Prior to Visit  Medication Sig Dispense Refill   acetaminophen  (TYLENOL ) 500 MG tablet Take 1,000 mg by mouth every 8 (eight) hours as needed for moderate pain (pain score 4-6).     albuterol  (VENTOLIN  HFA) 108 (90 Base) MCG/ACT inhaler Inhale 2 puffs into the lungs every 4 (four) hours as needed. 18 g 2   alfuzosin  (UROXATRAL ) 10 MG 24 hr tablet Take 1 tablet (10 mg total) by mouth daily with breakfast. 90 tablet 3   amLODipine  (NORVASC ) 10 MG tablet Take 1 tablet (10 mg total) by mouth daily. 90 tablet 3   azelastine  (ASTELIN ) 0.1 % nasal spray Place 1 spray into both nostrils 2 (two) times daily. Use in each nostril as directed 30 mL 0   chlorhexidine  (HIBICLENS ) 4 % external liquid Apply a dime size amount of the solution to warm water  and cleanse the affected areas twice daily while symptoms persist. 118 mL 0   Cholecalciferol (VITAMIN D3) 125 MCG (5000 UT) CAPS Take 5,000 Units by mouth daily.     cloNIDine  (CATAPRES ) 0.1 MG tablet Take 1 tablet (0.1 mg total) by mouth 2 (two) times daily as needed (SBP>160). 60 tablet 11   DULoxetine  (CYMBALTA ) 60 MG capsule Take 1 capsule (60 mg total) by mouth daily. 90 capsule 3   ELIQUIS  5  MG TABS tablet TAKE 1 TABLET(5 MG) BY MOUTH TWICE DAILY 60 tablet 5   ezetimibe  (ZETIA ) 10 MG tablet TAKE 1 TABLET(10 MG) BY MOUTH DAILY 90 tablet 1   furosemide  (LASIX ) 20 MG tablet Take 1 tablet (20 mg total) by mouth daily. Take with or after meal. 90 tablet 1   gabapentin  (NEURONTIN ) 400 MG capsule Take 1 capsule (400 mg total) by mouth 3 (three) times daily. 90 capsule 3   ipratropium-albuterol  (DUONEB) 0.5-2.5 (3) MG/3ML SOLN Take 3 mLs by nebulization every 4 (four) hours as needed. 360 mL 2   ketoconazole  (NIZORAL ) 2 % cream Apply topically 2 (two) times daily. 30 g 3   losartan  (COZAAR ) 100 MG tablet Take 1 tablet (100 mg total) by mouth daily. 90 tablet 1   metoprolol  tartrate (LOPRESSOR ) 25 MG  tablet TAKE 1 TABLET(25 MG) BY MOUTH TWICE DAILY 180 tablet 1   mirtazapine  (REMERON ) 15 MG tablet TAKE 1 TABLET(15 MG) BY MOUTH AT BEDTIME 30 tablet 2   omeprazole  (PRILOSEC) 20 MG capsule Take 20 mg by mouth daily.     ondansetron  (ZOFRAN ) 4 MG tablet Take 1 tablet (4 mg total) by mouth every 8 (eight) hours as needed for nausea or vomiting. 20 tablet 1   phenazopyridine  (PYRIDIUM ) 200 MG tablet Take 1 tablet (200 mg total) by mouth 3 (three) times daily as needed for pain. 6 tablet 0   polyethylene glycol (MIRALAX  / GLYCOLAX ) 17 g packet Take 17 g by mouth daily. 30 each 2   potassium chloride  SA (KLOR-CON  M) 20 MEQ tablet Take 1 tablet (20 mEq total) by mouth daily. 30 tablet 0   promethazine -dextromethorphan  (PROMETHAZINE -DM) 6.25-15 MG/5ML syrup Take 5 mLs by mouth 4 (four) times daily as needed. 118 mL 0   triamcinolone  cream (KENALOG ) 0.1 % Apply 1 Application topically 2 (two) times daily. 30 g 0   vitamin B-12 (CYANOCOBALAMIN ) 100 MCG tablet Take 100 mcg by mouth daily.     No facility-administered medications prior to visit.     Review of Systems: as per HPI  Constitutional:   No  weight loss, night sweats,  Fevers, chills, fatigue, or  lassitude.  HEENT:   No headaches,   Difficulty swallowing,  Tooth/dental problems, or  Sore throat,                No sneezing, itching, ear ache, nasal congestion, post nasal drip,   CV:  No chest pain,  Orthopnea, PND, swelling in lower extremities, anasarca, dizziness, palpitations, syncope.   GI  No heartburn, indigestion, abdominal pain, nausea, vomiting, diarrhea, change in bowel habits, loss of appetite, bloody stools.   Resp: No shortness of breath with exertion or at rest.  No excess mucus, no productive cough,  No non-productive cough,  No coughing up of blood.  No change in color of mucus.  No wheezing.  No chest wall deformity  Skin: no rash or lesions.  GU: no dysuria, change in color of urine, no urgency or frequency.  No flank pain, no hematuria   MS:  No joint pain or swelling.  No decreased range of motion.  No back pain.    Physical Exam  BP (!) 143/81   Pulse 79   Ht 5' 7 (1.702 m)   Wt 232 lb (105.2 kg)   SpO2 95%   BMI 36.34 kg/m   GEN: A/Ox3; pleasant , NAD, well nourished    HEENT:  [place]/AT,  EACs-clear, TMs-wnl, NOSE-clear, THROAT-clear, no lesions, no postnasal drip or exudate noted. Mallampati 4  NECK:  Supple w/ fair ROM; no JVD; normal carotid impulses w/o bruits; no thyromegaly or nodules palpated; no lymphadenopathy.    RESP  Few scattered wheezes with fairly good aeration P & A; w/o, rales/ or rhonchi. no accessory muscle use, no dullness to percussion  CARD:  RRR, no m/r/g, no peripheral edema, pulses intact, no cyanosis or clubbing.  GI:   Soft & nt; nml bowel sounds; no organomegaly or masses detected.   Musco: Warm bil, no deformities or joint swelling noted.   Neuro: alert, no focal deficits noted.    Skin: Warm, no lesions or rashes    Lab Results:  CBC    Component Value Date/Time   WBC 5.2 04/21/2024 1835   RBC 5.13 04/21/2024 1835   HGB 14.7  04/21/2024 1835   HGB 13.2 01/13/2024 1341   HCT 46.5 04/21/2024 1835   HCT 41.6 01/13/2024 1341   PLT 262  04/21/2024 1835   PLT 213 01/13/2024 1341   MCV 90.6 04/21/2024 1835   MCV 90 01/13/2024 1341   MCH 28.7 04/21/2024 1835   MCHC 31.6 04/21/2024 1835   RDW 13.1 04/21/2024 1835   RDW 12.8 01/13/2024 1341   LYMPHSABS 1.7 04/21/2024 1835   LYMPHSABS 2.3 10/18/2023 1501   MONOABS 0.5 04/21/2024 1835   EOSABS 0.1 04/21/2024 1835   EOSABS 0.1 10/18/2023 1501   BASOSABS 0.0 04/21/2024 1835   BASOSABS 0.0 10/18/2023 1501    BMET    Component Value Date/Time   NA 137 04/21/2024 1835   NA 144 01/13/2024 1341   K 3.5 04/21/2024 1835   CL 99 04/21/2024 1835   CO2 31 04/21/2024 1835   GLUCOSE 101 (H) 04/21/2024 1835   BUN 8 04/21/2024 1835   BUN 8 01/13/2024 1341   CREATININE 0.93 04/21/2024 1835   CALCIUM  9.2 04/21/2024 1835   GFRNONAA >60 04/21/2024 1835   GFRAA >60 02/19/2020 1622    BNP    Component Value Date/Time   BNP 27.0 04/21/2024 1835    ProBNP No results found for: PROBNP  Imaging: No results found.  Administration History     None           No data to display          No results found for: NITRICOXIDE   Assessment & Plan:   Assessment & Plan Excessive daytime sleepiness  Chronic bronchitis, unspecified chronic bronchitis type (HCC)  Assessment and Plan Assessment & Plan Sleep disturbance with possible sleep apnea Reports snoring, nightmares, sleep talking, and jumping during sleep. Experiences daytime fatigue. Epworth Sleepiness Scale indicates mild daytime sleepiness. - Ordered home sleep test to evaluate for sleep apnea. - Consider in-lab sleep study if home test inconclusive.  Chronic bronchitis with allergic rhinitis Reports congestion and bronchitis symptoms. Uses Duonebs and Astelin  nasal spray with no significant improvement. Smoking history with cessation five years ago. - Ordered pulmonary function tests to assess lung function and volumes. - Consider long-acting maintenance therapy for bronchitis if pulmonary function tests  indicate need.  History of prostate cancer Recent PET scan in September showed no lung nodules. - Plan for yearly low-dose CT scan for lung cancer screening starting next September.  History of tobacco use Smoked for 20 years, quit five years ago. Smoking history increases risk for lung issues. - Continue to monitor lung health due to smoking history. - Plan for yearly low-dose CT scan for lung cancer screening starting next September.   Return in about 2 months (around 07/27/2024) for sleep study review, PFT.  Candis Dandy, PA-C 05/27/2024

## 2024-05-28 ENCOUNTER — Ambulatory Visit: Admitting: Urology

## 2024-05-30 DIAGNOSIS — J309 Allergic rhinitis, unspecified: Secondary | ICD-10-CM | POA: Insufficient documentation

## 2024-05-30 DIAGNOSIS — G4712 Idiopathic hypersomnia without long sleep time: Secondary | ICD-10-CM | POA: Insufficient documentation

## 2024-05-30 NOTE — Progress Notes (Signed)
 Epworth Sleepiness Scale  Use the following scale to choose the most appropriate number for each situation. 0 Would never nod off 1  Slight  chance of nodding off 2 Moderate chance of nodding off 3 High chance of nodding off  Sitting and reading: 1 Watching TV: 3 Sitting, inactive, in a public place (e.g., in a meeting, theater, or dinner event): 3 As a passenger in a car for an hour or more without stopping for a break: 0 Lying down to rest when circumstances permit:3 Sitting and talking to someone: 2 Sitting quietly after a meal without alcohol: 2 In a car, while stopped for a few  minutes in traffic or at a light: 2  TOTOAL:14

## 2024-05-30 NOTE — Assessment & Plan Note (Signed)
 Persistent nasal congestion. Claritin  ineffective. - Recommend Zyrtec  or Allegra for allergy relief. - Advise consistent use of allergy medications.

## 2024-05-30 NOTE — Assessment & Plan Note (Signed)
 Stable with mirtazapine  15 mg.  No medication changes made today.

## 2024-05-30 NOTE — Assessment & Plan Note (Signed)
 BP Readings from Last 1 Encounters:  05/27/24 (!) 143/81   Elevated today, but he has not had medications this morning.  Counseled for compliance with the medications Advised DASH diet and moderate exercise/walking, at least 150 mins/week

## 2024-05-30 NOTE — Assessment & Plan Note (Signed)
 Suspected sleep apnea with nocturnal gasping and insomnia. Mirtazapine  15 mg provides inconsistent relief. No prior sleep study. - Refer to pulmonary group for sleep study evaluation, possibly home study based on insurance.

## 2024-06-09 ENCOUNTER — Other Ambulatory Visit: Payer: Self-pay | Admitting: Cardiovascular Disease

## 2024-06-09 DIAGNOSIS — H25813 Combined forms of age-related cataract, bilateral: Secondary | ICD-10-CM | POA: Diagnosis not present

## 2024-06-09 DIAGNOSIS — H01001 Unspecified blepharitis right upper eyelid: Secondary | ICD-10-CM | POA: Diagnosis not present

## 2024-06-09 DIAGNOSIS — H01002 Unspecified blepharitis right lower eyelid: Secondary | ICD-10-CM | POA: Diagnosis not present

## 2024-06-09 DIAGNOSIS — H40023 Open angle with borderline findings, high risk, bilateral: Secondary | ICD-10-CM | POA: Diagnosis not present

## 2024-06-10 ENCOUNTER — Ambulatory Visit: Attending: Internal Medicine | Admitting: Internal Medicine

## 2024-06-10 ENCOUNTER — Encounter: Payer: Self-pay | Admitting: Internal Medicine

## 2024-06-10 ENCOUNTER — Telehealth: Payer: Self-pay

## 2024-06-10 VITALS — BP 134/80 | HR 74 | Ht 67.0 in | Wt 232.2 lb

## 2024-06-10 DIAGNOSIS — N401 Enlarged prostate with lower urinary tract symptoms: Secondary | ICD-10-CM

## 2024-06-10 DIAGNOSIS — R002 Palpitations: Secondary | ICD-10-CM | POA: Diagnosis not present

## 2024-06-10 MED ORDER — SPIRONOLACTONE 25 MG PO TABS: 25.0000 mg | ORAL_TABLET | Freq: Every day | ORAL | 3 refills | Status: AC

## 2024-06-10 MED ORDER — ALFUZOSIN HCL ER 10 MG PO TB24: 10.0000 mg | ORAL_TABLET | Freq: Every day | ORAL | 3 refills | Status: AC

## 2024-06-10 NOTE — Patient Instructions (Signed)
 Medication Instructions:   Stop Clonidine   Start Aldactone 25 mg Daily   *If you need a refill on your cardiac medications before your next appointment, please call your pharmacy*  Lab Work: NONE   If you have labs (blood work) drawn today and your tests are completely normal, you will receive your results only by: MyChart Message (if you have MyChart) OR A paper copy in the mail If you have any lab test that is abnormal or we need to change your treatment, we will call you to review the results.  Testing/Procedures: NONE   Follow-Up: At [facility], you and your health needs are our priority.  As part of our continuing mission to provide you with exceptional heart care, our providers are all part of one team.  This team includes your primary Cardiologist (physician) and Advanced Practice Providers or APPs (Physician Assistants and Nurse Practitioners) who all work together to provide you with the care you need, when you need it.  Your next appointment:   1 year(s)  Provider:   Donnice Primus, MD    We recommend signing up for the patient portal called MyChart.  Sign up information is provided on this After Visit Summary.  MyChart is used to connect with patients for Virtual Visits (Telemedicine).  Patients are able to view lab/test results, encounter notes, upcoming appointments, etc.  Non-urgent messages can be sent to your provider as well.   To learn more about what you can do with MyChart, go to [email or web address].   Other Instructions Thank you for choosing [facility]!

## 2024-06-10 NOTE — Progress Notes (Signed)
 HPI Steven [name] returns today for followup of his SVT. He is a pleasant 73 yo man with multiple medical problems who had SVT at over 200/min. He underwent successful ablation of AVNRT and was non-inducible after his procedure. I saw him several months ago and he described recurrent symptoms that he thought might be due to SVT. He has just finished wearing a cardiac monitor which demonstrated NSR and only rare PVC's and PAC's but no SVT. He continues to have some palpitations but I cannot find any documented SVT. No syncope. He has been bothered by hypokalemia. He feels fatigue.  Allergies  Allergen Reactions   Latex Rash and Other (See Comments)    Possible reaction to latex gloves per patient   Meclizine Other (See Comments)    Other reaction(s): Abdominal Pain, Other     Current Outpatient Medications  Medication Sig Dispense Refill   acetaminophen  (TYLENOL ) 500 MG tablet Take 1,000 mg by mouth every 8 (eight) hours as needed for moderate pain (pain score 4-6).     albuterol  (VENTOLIN  HFA) 108 (90 Base) MCG/ACT inhaler Inhale 2 puffs into the lungs every 4 (four) hours as needed. 18 g 2   alfuzosin  (UROXATRAL ) 10 MG 24 hr tablet Take 1 tablet (10 mg total) by mouth daily with breakfast. 90 tablet 3   amLODipine  (NORVASC ) 10 MG tablet Take 1 tablet (10 mg total) by mouth daily. 90 tablet 3   azelastine  (ASTELIN ) 0.1 % nasal spray Place 1 spray into both nostrils 2 (two) times daily. Use in each nostril as directed 30 mL 0   chlorhexidine  (HIBICLENS ) 4 % external liquid Apply a dime size amount of the solution to warm water  and cleanse the affected areas twice daily while symptoms persist. 118 mL 0   Cholecalciferol (VITAMIN D3) 125 MCG (5000 UT) CAPS Take 5,000 Units by mouth daily.     DULoxetine  (CYMBALTA ) 60 MG capsule Take 1 capsule (60 mg total) by mouth daily. 90 capsule 3   ELIQUIS  5 MG TABS tablet TAKE 1 TABLET(5 MG) BY MOUTH TWICE DAILY 60 tablet 5   ezetimibe  (ZETIA ) 10 MG  tablet TAKE 1 TABLET(10 MG) BY MOUTH DAILY 90 tablet 1   furosemide  (LASIX ) 20 MG tablet Take 1 tablet (20 mg total) by mouth daily. Take with or after meal. 90 tablet 1   gabapentin  (NEURONTIN ) 400 MG capsule Take 1 capsule (400 mg total) by mouth 3 (three) times daily. 90 capsule 3   ipratropium-albuterol  (DUONEB) 0.5-2.5 (3) MG/3ML SOLN Take 3 mLs by nebulization every 4 (four) hours as needed. 360 mL 2   ketoconazole  (NIZORAL ) 2 % cream Apply topically 2 (two) times daily. 30 g 3   losartan  (COZAAR ) 100 MG tablet Take 1 tablet (100 mg total) by mouth daily. 90 tablet 1   metoprolol  tartrate (LOPRESSOR ) 25 MG tablet TAKE 1 TABLET(25 MG) BY MOUTH TWICE DAILY 180 tablet 1   mirtazapine  (REMERON ) 15 MG tablet TAKE 1 TABLET(15 MG) BY MOUTH AT BEDTIME 30 tablet 2   omeprazole  (PRILOSEC) 20 MG capsule Take 20 mg by mouth daily.     ondansetron  (ZOFRAN ) 4 MG tablet Take 1 tablet (4 mg total) by mouth every 8 (eight) hours as needed for nausea or vomiting. 20 tablet 1   phenazopyridine  (PYRIDIUM ) 200 MG tablet Take 1 tablet (200 mg total) by mouth 3 (three) times daily as needed for pain. 6 tablet 0   polyethylene glycol (MIRALAX  / GLYCOLAX ) 17 g packet Take  17 g by mouth daily. 30 each 2   potassium chloride  SA (KLOR-CON  M) 20 MEQ tablet Take 1 tablet (20 mEq total) by mouth daily. 30 tablet 0   promethazine -dextromethorphan  (PROMETHAZINE -DM) 6.25-15 MG/5ML syrup Take 5 mLs by mouth 4 (four) times daily as needed. 118 mL 0   spironolactone (ALDACTONE) 25 MG tablet Take 1 tablet (25 mg total) by mouth daily. 90 tablet 3   triamcinolone  cream (KENALOG ) 0.1 % Apply 1 Application topically 2 (two) times daily. 30 g 0   vitamin B-12 (CYANOCOBALAMIN ) 100 MCG tablet Take 100 mcg by mouth daily.     No current facility-administered medications for this visit.     Past Medical History:  Diagnosis Date   Anxiety    Arthritis    Atrial fibrillation (HCC)    Atrial fibrillation (HCC)    Atrial flutter  (HCC)    BPH (benign prostatic hyperplasia)    Chest pain    Dizziness    Dyspnea    laying down occ   Dysrhythmia    Fracture 08/17/2015   MULTIPLE RIB FRACTURES     FROM FALL    GERD (gastroesophageal reflux disease)    Hemorrhoids    History of kidney stones    noted on CT scan   History of radiation therapy 08/22/11-10/13/11   prostate   Hyperlipemia    Hypertension    Hyperthyroidism    IBS (irritable bowel syndrome)    Insomnia    Light headedness    Migraine    Numbness and tingling in left arm    Numbness and tingling of both legs    Open fracture of left elbow 08/18/2015   Pre-diabetes    Prostate cancer [facility])    prostate s/p radiation Mar 2013   Rib fractures 08/17/2015   Tinnitus     ROS:   All systems reviewed and negative except as noted in the HPI.   Past Surgical History:  Procedure Laterality Date   BOTOX  INJECTION N/A 01/14/2020   Procedure: INJECTION OF BOTOX  INTO ANAL SPHINCTER;  Surgeon: Teresa Lonni HERO, MD;  Location: [facility];  Service: General;  Laterality: N/A;   COLONOSCOPY N/A 12/19/2012   MFM:Mzrujo 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 Normal appearing colon   COLONOSCOPY WITH PROPOFOL  N/A 10/23/2019   Procedure: COLONOSCOPY WITH PROPOFOL ;  Surgeon: Shaaron Lamar HERO, MD; external and grade 2 internal hemorrhoids, abnormal rectal blood vessels consistent with radiation proctitis s/p APC therapy, otherwise normal exam.   EVALUATION UNDER ANESTHESIA WITH ANAL FISTULECTOMY N/A 01/14/2020   Procedure: ANORECTAL EXAM UNDER ANESTHESIA;  Surgeon: Teresa Lonni HERO, MD;  Location: [facility];  Service: General;  Laterality: N/A;   HOT HEMOSTASIS  10/23/2019   Procedure: HOT HEMOSTASIS (ARGON PLASMA COAGULATION/BICAP);  Surgeon: Shaaron Lamar HERO, MD;  Location: [facility];  Service: Endoscopy;;  apc rectal proctitis     KIDNEY SURGERY  1982   kidney tube collapse repair   PROSTATE BIOPSY N/A  11/15/2023   Procedure: BIOPSY, PROSTATE;  Surgeon: Sherrilee Belvie CROME, MD;  Location: [facility];  Service: Urology;  Laterality: N/A;   PROSTATE BIOPSY N/A 11/15/2023   Procedure: BIOPSY, PROSTATE, RECTAL APPROACH, WITH US  GUIDANCE;  Surgeon: Sherrilee Belvie CROME, MD;  Location: [facility];  Service: Urology;  Laterality: N/A;   RADIOACTIVE SEED IMPLANT     Prostate   SVT ABLATION N/A 12/12/2022   Procedure: SVT ABLATION;  Surgeon: Waddell Steven ORN, MD;  Location: [facility];  Service: Cardiovascular;  Laterality: N/A;   TRANSRECTAL ULTRASOUND N/A 11/15/2023   Procedure: ULTRASOUND, RECTAL APPROACH;  Surgeon: Sherrilee Belvie CROME, MD;  Location: [facility];  Service: Urology;  Laterality: N/A;     Family History  Problem Relation Age of Onset   Aneurysm Mother        aortic   Hypertension Mother    Headache Mother    Prostate cancer Father    Hypertension Father    Heart failure Brother    Colon cancer Neg Hx    Colon polyps Neg Hx      Social History   Socioeconomic History   Marital status: Single    Spouse name: Not on file   Number of children: 2   Years of education: 12   Highest education level: 12th grade  Occupational History   Occupation: Custodian     Employer: [employer]  Tobacco Use   Smoking status: Former    Current packs/day: 0.00    Average packs/day: 1 pack/day for 20.0 years (20.0 ttl pk-yrs)    Types: Cigarettes    Start date: 2000    Quit date: 2020    Years since quitting: 5.8    Passive exposure: Past   Smokeless tobacco: Never   Tobacco comments:    Quit smoking x 2 years    07/2015   SOMETIIMES i USE VAPOR     Smoking Cessation Classes, Agencies, Aeronautical Engineer Use   Vaping status: Never Used  Substance and Sexual Activity   Alcohol use: Not Currently   Drug use: Never   Sexual activity: Not Currently    Partners: Female  Other Topics Concern   Not on file  Social History Narrative   Lives with friend    Divorced   No caffeine   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   Social Drivers of Corporate Investment Banker Strain: Low Risk  (04/28/2024)   Overall Financial Resource Strain (CARDIA)    Difficulty of Paying Living Expenses: Not hard at all  Food Insecurity: No Food Insecurity (04/28/2024)   Hunger Vital Sign    Worried About Running Out of Food in the Last Year: Never true    Ran Out of Food in the Last Year: Never true  Transportation Needs: No Transportation Needs (04/28/2024)   PRAPARE - Administrator, Civil Service (Medical): No    Lack of Transportation (Non-Medical): No  Physical Activity: Inactive (04/28/2024)   Exercise Vital Sign    Days of Exercise per Week: 0 days    Minutes of Exercise per Session: 0 min  Stress: No Stress Concern Present (04/28/2024)   [employer] of Occupational Health - Occupational Stress Questionnaire    Feeling of Stress: Only a little  Social Connections: Socially Isolated (04/28/2024)   Social Connection and Isolation Panel    Frequency of Communication with Friends and Family: More than three times a week    Frequency of Social Gatherings with Friends and Family: Once a week    Attends Religious Services: Never    Database Administrator or Organizations: No    Attends Banker Meetings: Never    Marital Status: Widowed  Intimate Partner Violence: Not At Risk (04/28/2024)   Humiliation, Afraid, Rape, and Kick questionnaire    Fear of Current or Ex-Partner: No    Emotionally Abused: No    Physically Abused:  No    Sexually Abused: No     BP 134/80   Pulse 74   Ht 5' 7 (1.702 m)   Wt 232 lb 3.2 oz (105.3 kg)   SpO2 97%   BMI 36.37 kg/m   Physical Exam:  Well appearing NAD HEENT: Unremarkable Neck:  No JVD, no thyromegally Lymphatics:  No adenopathy Back:  No CVA tenderness Lungs:  Clear with no wheezes HEART:  Regular rate rhythm, no murmurs, no rubs, no clicks Abd:  soft, positive  bowel sounds, no organomegally, no rebound, no guarding Ext:  2 plus pulses, no edema, no cyanosis, no clubbing Skin:  No rashes no nodules Neuro:  CN II through XII intact, motor grossly intact   Assess/Plan:  SVT - he is s/p ablation. We have not seen any documented recurrent SVT on his cardiac monitor. He will undergo watchful waiting.  HTN - his bp is well controlled today. We will follow. Hypokalemia - he will start aldactone 25 mg daily and stop clonidine . Hopefully stopping clonidine  will help with his fatigue.    Steven Savan Ruta,MD

## 2024-06-10 NOTE — Telephone Encounter (Signed)
 Per OV notes on 03/17/2024 MD [name] wrote  Benign prostatic hyperplasia with urinary obstruction -continue uroxatral  10mg .

## 2024-06-18 ENCOUNTER — Ambulatory Visit: Payer: Self-pay

## 2024-06-18 ENCOUNTER — Telehealth: Payer: Self-pay | Admitting: Cardiovascular Disease

## 2024-06-18 NOTE — Telephone Encounter (Signed)
 Refills were sent to the patient's pharmacy of choice on 06-11-24.

## 2024-06-18 NOTE — Telephone Encounter (Signed)
 FYI Only or Action Required?: FYI only for provider: appointment scheduled on 06/24/24.  Patient was last seen in primary care on 05/22/2024 by Bevely Doffing, FNP.  Called Nurse Triage reporting Rash.  Symptoms began several days ago.  Interventions attempted: Prescription medications: Ketoconazole .  Symptoms are: stable.  Triage Disposition: See PCP When Office is Open (Within 3 Days)  Patient/caregiver understands and will follow disposition?: Yes Reason for Disposition  [1] Pimples (localized) AND DOMITILLA.DISTEL ] no improvement after using Care Advice  Answer Assessment - Initial Assessment Questions Patient states earlier this year had it on his scalp. Put ketoconazole  on it.  Noticed 1 or 2 on genital area couple of weeks ago, got better, but still there. Patient sees dermatologist but unsure when he can get an appointment. Patient was looking for a refill on Minocycline, Advised patient he should be seen by a provider.    1. APPEARANCE of RASH: What does the rash look like? (e.g., blisters, dry flaky skin, red spots, redness, sores)     Red, white then wiped it off and then it bled a bit  2. LOCATION: Where is the rash located?      Around the mouth  3. NUMBER: How many spots are there?      2  4. SIZE: How big are the spots? (e.g., inches, cm; or compare to size of pinhead, tip of pen, eraser, pea)      Little bigger than the tip of a pen  5. ONSET: When did the rash start?      Couple days ago  6. ITCHING: Does the rash itch? If Yes, ask: How bad is the itch?  (Scale 0-10; or none, mild, moderate, severe)     Itchy  7. PAIN: Does the rash hurt? If Yes, ask: How bad is the pain?  (Scale 0-10; or none, mild, moderate, severe)     Little sore  8. OTHER SYMPTOMS: Do you have any other symptoms? (e.g., fever)   Denies  Protocols used: Rash or Redness - Localized-A-AH  Copied from CRM #[id number]. Topic: Clinical - Medication Question >> Jun 18, 2024  4:11 PM  Hadassah PARAS wrote: Reason for CRM: Pt is wanting to refill minocycline for skin irritation. Please advise #[phone]

## 2024-06-18 NOTE — Telephone Encounter (Signed)
*  STAT* If patient is at the pharmacy, call can be transferred to refill team.   1. Which medications need to be refilled? (please list name of each medication and dose if known)   amLODipine  (NORVASC ) 10 MG tablet   2. Would you like to learn more about the convenience, safety, & potential cost savings by using the [facility]?   3. Are you open to using the [facility] (Type [facility]. ).  4. Which pharmacy/location (including street and city if local pharmacy) is medication to be sent to?  [id number] - [place], [place] - [address]   5. Do they need a 30 day or 90 day supply?   90 day  Patient stated he has 5 tablets left.

## 2024-06-23 ENCOUNTER — Telehealth: Payer: Self-pay | Admitting: Family Medicine

## 2024-06-23 DIAGNOSIS — E782 Mixed hyperlipidemia: Secondary | ICD-10-CM

## 2024-06-23 NOTE — Telephone Encounter (Unsigned)
 Copied from CRM #[id number]. Topic: Clinical - Medication Refill >> Jun 23, 2024 11:51 AM Deaijah H wrote: Medication: ezetimibe  (ZETIA ) 10 MG tablet  Has the patient contacted their pharmacy? Yes (Agent: If no, request that the patient contact the pharmacy for the refill. If patient does not wish to contact the pharmacy document the reason why and proceed with request.) Need request from docotor (Agent: If yes, when and what did the pharmacy advise?)  This is the patient's preferred pharmacy:  [id number] - [place], [place] - [address] [place] [address] Phone: [phone] Fax: [phone]    Is this the correct pharmacy for this prescription? Yes If no, delete pharmacy and type the correct one.   Has the prescription been filled recently? No  Is the patient out of the medication? Yes  Has the patient been seen for an appointment in the last year OR does the patient have an upcoming appointment? Yes  Can we respond through MyChart? Yes  Agent: Please be advised that Rx refills may take up to 3 business days. We ask that you follow-up with your pharmacy.

## 2024-06-23 NOTE — Telephone Encounter (Signed)
Noted.  Appt made. 

## 2024-06-24 ENCOUNTER — Encounter: Payer: Self-pay | Admitting: Family Medicine

## 2024-06-24 ENCOUNTER — Ambulatory Visit: Payer: Self-pay | Admitting: Family Medicine

## 2024-06-24 ENCOUNTER — Other Ambulatory Visit: Payer: Self-pay

## 2024-06-24 VITALS — BP 134/75 | HR 99 | Ht 67.0 in | Wt 237.0 lb

## 2024-06-24 DIAGNOSIS — L710 Perioral dermatitis: Secondary | ICD-10-CM | POA: Insufficient documentation

## 2024-06-24 DIAGNOSIS — E782 Mixed hyperlipidemia: Secondary | ICD-10-CM

## 2024-06-24 MED ORDER — EZETIMIBE 10 MG PO TABS: 10.0000 mg | ORAL_TABLET | Freq: Every day | ORAL | 0 refills | Status: AC

## 2024-06-24 MED ORDER — DOXYCYCLINE HYCLATE 100 MG PO CAPS: 100.0000 mg | ORAL_CAPSULE | Freq: Two times a day (BID) | ORAL | 1 refills | Status: DC

## 2024-06-24 NOTE — Progress Notes (Signed)
 Subjective:  Patient ID: Steven [name], male    DOB: 12-05-1950  Age: 73 y.o. MRN: [id number]  CC:   Chief Complaint  Patient presents with   boils    Boils around mouth and genital area    HPI:  73 year old male presents for evaluation of the above.  Patient has had issues with ongoing perioral dermatitis since earlier in the year.  For saw dermatology in April.  He states that he has had recurrence of breakouts around his mouth for the past 2 weeks.  He also states that he is having some bumps in the genital region.  Will examine today.  He states that he has been on topical treatment for perioral dermatitis previously.  Patient Active Problem List   Diagnosis Date Noted   Perioral dermatitis 06/24/2024   Hypersomnia without long sleep time, idiopathic 05/30/2024   Allergic rhinitis 05/30/2024   Acute bronchitis 04/30/2024   Tinea cruris 02/21/2024   Chronic pain of both knees 01/01/2024   Degeneration of intervertebral disc of lumbar region with discogenic back pain and lower extremity pain 01/01/2024   Chronic bronchitis (HCC) 11/30/2023   Chronic sinusitis 11/30/2023   Elevated PSA 11/15/2023   Contact dermatitis 06/27/2023   Inflammatory arthritis 05/23/2023   Migraine, unspecified, not intractable, without status migrainosus 05/08/2023   Depression with anxiety 04/19/2023   Mixed hyperlipidemia 03/14/2023   Prediabetes 03/14/2023   Leukopenia 11/24/2022   SVT (supraventricular tachycardia) 11/24/2022   Calculus of gallbladder without cholecystitis without obstruction 08/09/2022   Rash and nonspecific skin eruption 05/10/2022   Fibromyalgia 07/07/2020   Paresthesia of upper limb 03/04/2020   Carcinoma of prostate (HCC) 02/17/2020   Personal history of malignant neoplasm of prostate 12/12/2019   Benign prostatic hyperplasia with urinary obstruction 12/12/2019   Weak urinary stream 12/12/2019   Hyperthyroidism 11/24/2019   IBS (irritable bowel syndrome)  10/21/2019   Sleep apnea 09/20/2019   Insomnia 09/20/2019   Paroxysmal atrial flutter (HCC) 04/17/2019   Chronic atrial fibrillation (HCC) 03/02/2019   HTN (hypertension) 03/02/2019   Constipation 08/02/2017   Hemorrhoids 08/02/2017   Radiation proctitis 01/30/2013   Obese 05/30/2012   Dyspnea 05/30/2012   Elevated lipids 05/30/2012   Palpitations 05/30/2012   Malignant neoplasm of prostate (HCC) 08/13/2011    Social Hx   Social History   Socioeconomic History   Marital status: Single    Spouse name: Not on file   Number of children: 2   Years of education: 12   Highest education level: 12th grade  Occupational History   Occupation: Custodian     Employer: [employer]  Tobacco Use   Smoking status: Former    Current packs/day: 0.00    Average packs/day: 1 pack/day for 20.0 years (20.0 ttl pk-yrs)    Types: Cigarettes    Start date: 2000    Quit date: 2020    Years since quitting: 5.9    Passive exposure: Past   Smokeless tobacco: Never   Tobacco comments:    Quit smoking x 2 years    07/2015   SOMETIIMES i USE VAPOR     Smoking Cessation Classes, Agencies, Public Librarian  Vaping Use   Vaping status: Never Used  Substance and Sexual Activity   Alcohol use: Not Currently   Drug use: Never   Sexual activity: Not Currently    Partners: Female  Other Topics Concern   Not on file  Social History Narrative   Lives with friend  Divorced   No caffeine   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   Social Drivers of Corporate Investment Banker Strain: Low Risk  (04/28/2024)   Overall Financial Resource Strain (CARDIA)    Difficulty of Paying Living Expenses: Not hard at all  Food Insecurity: No Food Insecurity (04/28/2024)   Hunger Vital Sign    Worried About Running Out of Food in the Last Year: Never true    Ran Out of Food in the Last Year: Never true  Transportation Needs: No Transportation Needs (04/28/2024)   PRAPARE -  Administrator, Civil Service (Medical): No    Lack of Transportation (Non-Medical): No  Physical Activity: Inactive (04/28/2024)   Exercise Vital Sign    Days of Exercise per Week: 0 days    Minutes of Exercise per Session: 0 min  Stress: No Stress Concern Present (04/28/2024)   [employer] of Occupational Health - Occupational Stress Questionnaire    Feeling of Stress: Only a little  Social Connections: Socially Isolated (04/28/2024)   Social Connection and Isolation Panel    Frequency of Communication with Friends and Family: More than three times a week    Frequency of Social Gatherings with Friends and Family: Once a week    Attends Religious Services: Never    Database Administrator or Organizations: No    Attends Banker Meetings: Never    Marital Status: Widowed    Review of Systems Per HPI  Objective:  BP 134/75   Pulse 99   Ht 5' 7 (1.702 m)   Wt 237 lb (107.5 kg)   SpO2 90%   BMI 37.12 kg/m      06/24/2024    9:37 AM 06/10/2024    1:42 PM 05/27/2024   10:41 AM  BP/Weight  Systolic BP 134 134 143  Diastolic BP 75 80 81  Wt. (Lbs) 237 232.2 232  BMI 37.12 kg/m2 36.37 kg/m2 36.34 kg/m2    Physical Exam Vitals and nursing note reviewed.  Constitutional:      General: He is not in acute distress. HENT:     Head: Normocephalic and atraumatic.     Mouth/Throat:     Comments: Erythematous papules noted around the mouth. Genitourinary:    Comments: 1 firm papule noted on the scrotum.  Appears to be consistent with a cyst. Neurological:     Mental Status: He is alert.     Lab Results  Component Value Date   WBC 5.2 04/21/2024   HGB 14.7 04/21/2024   HCT 46.5 04/21/2024   PLT 262 04/21/2024   GLUCOSE 101 (H) 04/21/2024   CHOL 165 10/09/2023   TRIG 139 10/09/2023   HDL 45 10/09/2023   LDLCALC 95 10/09/2023   ALT 14 04/21/2024   AST 16 04/21/2024   NA 137 04/21/2024   K 3.5 04/21/2024   CL 99 04/21/2024   CREATININE  0.93 04/21/2024   BUN 8 04/21/2024   CO2 31 04/21/2024   TSH 3.240 04/09/2024   PSA 1.8 12/10/2019   INR 1.2 08/19/2022   HGBA1C 5.6 10/09/2023     Assessment & Plan:  Perioral dermatitis Assessment & Plan: Treating with doxycycline.  Orders: -     Doxycycline Hyclate; Take 1 capsule (100 mg total) by mouth 2 (two) times daily.  Dispense: 60 capsule; Refill: 1    Follow-up: Needs follow-up with his primary care provider  Jacqulyn Ahle DO [facility]

## 2024-06-24 NOTE — Patient Instructions (Signed)
 This is perioral dermatitis.  Medication as directed.  Follow up with Dermatology.

## 2024-06-24 NOTE — Assessment & Plan Note (Signed)
Treating with doxycycline. 

## 2024-06-28 ENCOUNTER — Other Ambulatory Visit: Payer: Self-pay | Admitting: Cardiovascular Disease

## 2024-06-30 ENCOUNTER — Ambulatory Visit: Admitting: Urology

## 2024-07-03 ENCOUNTER — Telehealth: Payer: Self-pay | Admitting: Cardiovascular Disease

## 2024-07-03 NOTE — Telephone Encounter (Signed)
°*  STAT* If patient is at the pharmacy, call can be transferred to refill team.   1. Which medications need to be refilled? (please list name of each medication and dose if known)   metoprolol  tartrate (LOPRESSOR ) 25 MG tablet    2. Which pharmacy/location (including street and city if local pharmacy) is medication to be sent to?  [id number] - [place], [place] - [address]      3. Do they need a 30 day or 90 day supply? 90 day    Pt is out of medication

## 2024-07-04 NOTE — Telephone Encounter (Signed)
 Placed call to pt, left a message that refill was sent 07/02/2024.

## 2024-07-04 NOTE — Telephone Encounter (Signed)
 Refill was sent 07/02/24.

## 2024-07-14 ENCOUNTER — Ambulatory Visit: Admitting: Urology

## 2024-07-14 VITALS — BP 123/64 | HR 69

## 2024-07-14 DIAGNOSIS — N138 Other obstructive and reflux uropathy: Secondary | ICD-10-CM | POA: Diagnosis not present

## 2024-07-14 DIAGNOSIS — R3912 Poor urinary stream: Secondary | ICD-10-CM

## 2024-07-14 DIAGNOSIS — C61 Malignant neoplasm of prostate: Secondary | ICD-10-CM

## 2024-07-14 DIAGNOSIS — N401 Enlarged prostate with lower urinary tract symptoms: Secondary | ICD-10-CM

## 2024-07-14 MED ORDER — ALFUZOSIN HCL ER 10 MG PO TB24: 10.0000 mg | ORAL_TABLET | Freq: Every day | ORAL | 3 refills | Status: AC

## 2024-07-14 NOTE — Progress Notes (Signed)
 "  07/14/2024 10:22 AM   Steven [name] 24-Dec-1950 [id number]  Referring provider: Terry Wilhelmena Lloyd Hilario, FNP [phone] S. [address] [place],  [place] [address]  Followup prostate cancer.    HPI: [name] is a 73yo here for followup for prostate cancer and BPh with weak stream. IPSS 2 QOL 2 on uroxatral  10mg  at bedtime. Urine stream strong. No straining to urinate. Nocturia 1-2x. He underwent PSMA PET in 03/2024 which showed recurrance in the prostate gland. No recent PSA    PMH: Past Medical History:  Diagnosis Date   Anxiety    Arthritis    Atrial fibrillation (HCC)    Atrial fibrillation (HCC)    Atrial flutter (HCC)    BPH (benign prostatic hyperplasia)    Chest pain    Dizziness    Dyspnea    laying down occ   Dysrhythmia    Fracture 08/17/2015   MULTIPLE RIB FRACTURES     FROM FALL    GERD (gastroesophageal reflux disease)    Hemorrhoids    History of kidney stones    noted on CT scan   History of radiation therapy 08/22/11-10/13/11   prostate   Hyperlipemia    Hypertension    Hyperthyroidism    IBS (irritable bowel syndrome)    Insomnia    Light headedness    Migraine    Numbness and tingling in left arm    Numbness and tingling of both legs    Open fracture of left elbow 08/18/2015   Pre-diabetes    Prostate cancer [facility])    prostate s/p radiation Mar 2013   Rib fractures 08/17/2015   Tinnitus     Surgical History: Past Surgical History:  Procedure Laterality Date   BOTOX  INJECTION N/A 01/14/2020   Procedure: INJECTION OF BOTOX  INTO ANAL SPHINCTER;  Surgeon: Steven Lonni HERO, MD;  Location: [facility];  Service: General;  Laterality: N/A;   COLONOSCOPY N/A 12/19/2012   MFM:Mzrujo 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 Normal appearing colon   COLONOSCOPY WITH PROPOFOL  N/A 10/23/2019   Procedure: COLONOSCOPY WITH PROPOFOL ;  Surgeon: Steven Lamar HERO, MD; external and grade 2 internal  hemorrhoids, abnormal rectal blood vessels consistent with radiation proctitis s/p APC therapy, otherwise normal exam.   EVALUATION UNDER ANESTHESIA WITH ANAL FISTULECTOMY N/A 01/14/2020   Procedure: ANORECTAL EXAM UNDER ANESTHESIA;  Surgeon: Steven Lonni HERO, MD;  Location: [facility];  Service: General;  Laterality: N/A;   HOT HEMOSTASIS  10/23/2019   Procedure: HOT HEMOSTASIS (ARGON PLASMA COAGULATION/BICAP);  Surgeon: Steven Lamar HERO, MD;  Location: [facility];  Service: Endoscopy;;  apc rectal proctitis     KIDNEY SURGERY  1982   kidney tube collapse repair   PROSTATE BIOPSY N/A 11/15/2023   Procedure: BIOPSY, PROSTATE;  Surgeon: Steven Belvie CROME, MD;  Location: [facility];  Service: Urology;  Laterality: N/A;   PROSTATE BIOPSY N/A 11/15/2023   Procedure: BIOPSY, PROSTATE, RECTAL APPROACH, WITH US  GUIDANCE;  Surgeon: Steven Belvie CROME, MD;  Location: [facility];  Service: Urology;  Laterality: N/A;   RADIOACTIVE SEED IMPLANT     Prostate   SVT ABLATION N/A 12/12/2022   Procedure: SVT ABLATION;  Surgeon: Steven Danelle ORN, MD;  Location: [facility]IVE CV [facility];  Service: Cardiovascular;  Laterality: N/A;   TRANSRECTAL ULTRASOUND N/A 11/15/2023   Procedure: ULTRASOUND, RECTAL APPROACH;  Surgeon: Steven Belvie CROME, MD;  Location: [facility];  Service: Urology;  Laterality: N/A;  Home Medications:  Allergies as of 07/14/2024       Reactions   Latex Rash, Other (See Comments)   Possible reaction to latex gloves per patient   Meclizine Other (See Comments)   Other reaction(s): Abdominal Pain, Other        Medication List        Accurate as of July 14, 2024 10:22 AM. If you have any questions, ask your nurse or doctor.          acetaminophen  500 MG tablet Commonly known as: TYLENOL  Take 1,000 mg by mouth every 8 (eight) hours as needed for moderate pain (pain score 4-6).   albuterol  108 (90 Base) MCG/ACT inhaler Commonly known as: VENTOLIN  HFA Inhale 2 puffs into the lungs every  4 (four) hours as needed.   alfuzosin  10 MG 24 hr tablet Commonly known as: UROXATRAL  Take 1 tablet (10 mg total) by mouth daily with breakfast.   amLODipine  10 MG tablet Commonly known as: NORVASC  Take 1 tablet (10 mg total) by mouth daily.   azelastine  0.1 % nasal spray Commonly known as: ASTELIN  Place 1 spray into both nostrils 2 (two) times daily. Use in each nostril as directed   chlorhexidine  4 % external liquid Commonly known as: Hibiclens  Apply a dime size amount of the solution to warm water  and cleanse the affected areas twice daily while symptoms persist.   doxycycline  100 MG capsule Commonly known as: VIBRAMYCIN  Take 1 capsule (100 mg total) by mouth 2 (two) times daily.   DULoxetine  60 MG capsule Commonly known as: Cymbalta  Take 1 capsule (60 mg total) by mouth daily.   Eliquis  5 MG Tabs tablet Generic drug: apixaban  TAKE 1 TABLET(5 MG) BY MOUTH TWICE DAILY   ezetimibe  10 MG tablet Commonly known as: ZETIA  Take 1 tablet (10 mg total) by mouth daily.   furosemide  20 MG tablet Commonly known as: LASIX  Take 1 tablet (20 mg total) by mouth daily. Take with or after meal.   gabapentin  400 MG capsule Commonly known as: Neurontin  Take 1 capsule (400 mg total) by mouth 3 (three) times daily.   ipratropium-albuterol  0.5-2.5 (3) MG/3ML Soln Commonly known as: DUONEB Take 3 mLs by nebulization every 4 (four) hours as needed.   ketoconazole  2 % cream Commonly known as: NIZORAL  Apply topically 2 (two) times daily.   losartan  100 MG tablet Commonly known as: COZAAR  Take 1 tablet (100 mg total) by mouth daily.   metoprolol  tartrate 25 MG tablet Commonly known as: LOPRESSOR  TAKE 1 TABLET(25 MG) BY MOUTH TWICE DAILY   mirtazapine  15 MG tablet Commonly known as: REMERON  TAKE 1 TABLET(15 MG) BY MOUTH AT BEDTIME   omeprazole  20 MG capsule Commonly known as: PRILOSEC Take 20 mg by mouth daily.   ondansetron  4 MG tablet Commonly known as: Zofran  Take 1  tablet (4 mg total) by mouth every 8 (eight) hours as needed for nausea or vomiting.   phenazopyridine  200 MG tablet Commonly known as: PYRIDIUM  Take 1 tablet (200 mg total) by mouth 3 (three) times daily as needed for pain.   polyethylene glycol 17 g packet Commonly known as: MIRALAX  / GLYCOLAX  Take 17 g by mouth daily.   potassium chloride  SA 20 MEQ tablet Commonly known as: KLOR-CON  M Take 1 tablet (20 mEq total) by mouth daily.   promethazine -dextromethorphan  6.25-15 MG/5ML syrup Commonly known as: PROMETHAZINE -DM Take 5 mLs by mouth 4 (four) times daily as needed.   spironolactone  25 MG tablet Commonly known as: ALDACTONE  Take  1 tablet (25 mg total) by mouth daily.   triamcinolone  cream 0.1 % Commonly known as: KENALOG  Apply 1 Application topically 2 (two) times daily.   vitamin B-12 100 MCG tablet Commonly known as: CYANOCOBALAMIN  Take 100 mcg by mouth daily.   Vitamin D3 125 MCG (5000 UT) Caps Take 5,000 Units by mouth daily.        Allergies: Allergies[1]  Family History: Family History  Problem Relation Age of Onset   Aneurysm Mother        aortic   Hypertension Mother    Headache Mother    Prostate cancer Father    Hypertension Father    Heart failure Brother    Colon cancer Neg Hx    Colon polyps Neg Hx     Social History:  reports that he quit smoking about 5 years ago. His smoking use included cigarettes. He started smoking about 25 years ago. He has a 20 pack-year smoking history. He has been exposed to tobacco smoke. He has never used smokeless tobacco. He reports that he does not currently use alcohol. He reports that he does not use drugs.  ROS: All other review of systems were reviewed and are negative except what is noted above in HPI  Physical Exam: BP 123/64   Pulse 69   Constitutional:  Alert and oriented, No acute distress. HEENT: [place] AT, moist mucus membranes.  Trachea midline, no masses. Cardiovascular: No clubbing, cyanosis, or  edema. Respiratory: Normal respiratory effort, no increased work of breathing. GI: Abdomen is soft, nontender, nondistended, no abdominal masses GU: No CVA tenderness.  Lymph: No cervical or inguinal lymphadenopathy. Skin: No rashes, bruises or suspicious lesions. Neurologic: Grossly intact, no focal deficits, moving all 4 extremities. Psychiatric: Normal mood and affect.  Laboratory Data: Lab Results  Component Value Date   WBC 5.2 04/21/2024   HGB 14.7 04/21/2024   HCT 46.5 04/21/2024   MCV 90.6 04/21/2024   PLT 262 04/21/2024    Lab Results  Component Value Date   CREATININE 0.93 04/21/2024    Lab Results  Component Value Date   PSA 1.8 12/10/2019   PSA 2.3 11/10/2019    No results found for: TESTOSTERONE   Lab Results  Component Value Date   HGBA1C 5.6 10/09/2023    Urinalysis    Component Value Date/Time   COLORURINE STRAW (A) 01/07/2023 1953   APPEARANCEUR Clear 03/17/2024 1454   LABSPEC 1.008 01/07/2023 1953   PHURINE 7.0 01/07/2023 1953   GLUCOSEU Negative 03/17/2024 1454   HGBUR NEGATIVE 01/07/2023 1953   BILIRUBINUR negative 05/03/2024 1349   BILIRUBINUR Negative 03/17/2024 1454   KETONESUR negative 05/03/2024 1349   KETONESUR NEGATIVE 01/07/2023 1953   PROTEINUR Negative 03/17/2024 1454   PROTEINUR NEGATIVE 01/07/2023 1953   UROBILINOGEN 0.2 05/03/2024 1349   UROBILINOGEN 1.0 08/03/2012 1156   NITRITE Negative 05/03/2024 1349   NITRITE Negative 03/17/2024 1454   NITRITE NEGATIVE 01/07/2023 1953   LEUKOCYTESUR Negative 05/03/2024 1349   LEUKOCYTESUR Negative 03/17/2024 1454   LEUKOCYTESUR NEGATIVE 01/07/2023 1953    Lab Results  Component Value Date   LABMICR See below: 03/17/2024   WBCUA None seen 03/17/2024   LABEPIT None seen 03/17/2024   BACTERIA None seen 03/17/2024    Pertinent Imaging: PSMA PET: Images reviewed and discussed with the patient  Results for orders placed during the hospital encounter of 11/07/19  DG Abdomen 1  View  Narrative CLINICAL DATA:  Constipation. Hemorrhoids. Irritable bowel syndrome. Prostate cancer.  EXAM: ABDOMEN -  1 VIEW  COMPARISON:  CT pelvis 09/25/2019 in CT abdomen 04/29/2019  FINDINGS: Oval-shaped densities along the midline of the upper abdomen, possibly in the stomach. The patient had gallstones on the 04/29/2019 exam but these densities appear more regular and more medially located than expected location for gallstones. There is another oval-shaped density projecting over the transverse colon and 1 along the rectum, again I suspect that these are within stomach/bowel.  The tiny punctate right kidney lower pole renal calculus shown on 04/29/2019 is not readily seen on today's conventional radiographs.  Dextroconvex lumbar scoliosis with rotary component. No dilated bowel. Bowel gas pattern appears otherwise unremarkable.  IMPRESSION: 1. Oval-shaped densities in the abdomen are thought to be in the stomach and bowel. 2. No dilated bowel.  Unremarkable bowel gas pattern. 3. Dextroconvex lumbar scoliosis with rotary component.   Electronically Signed By: Ryan Salvage M.D. On: 11/07/2019 17:35  No results found for this or any previous visit.  No results found for this or any previous visit.  No results found for this or any previous visit.  No results found for this or any previous visit.  No results found for this or any previous visit.  No results found for this or any previous visit.  No results found for this or any previous visit.   Assessment & Plan:    1. Prostate cancer (HCC) (Primary) I discussed the natural history of high risk recurrent prostate cancer with the patient and the various treatment options including active surveillance, cryotherapy, HIFU and ADT. After discussing the options the patient elects for cryotherapy.    2. Benign prostatic hyperplasia with urinary obstruction Continue uroxatral  10mg  qhs  3. Weak urinary  stream Continue uroxatral  10mg  qhs   No follow-ups on file.  Belvie Clara, MD  [facility] [place]      [1]  Allergies Allergen Reactions   Latex Rash and Other (See Comments)    Possible reaction to latex gloves per patient   Meclizine Other (See Comments)    Other reaction(s): Abdominal Pain, Other   "

## 2024-07-15 LAB — PSA: Prostate Specific Ag, Serum: 5 ng/mL — ABNORMAL HIGH (ref 0.0–4.0)

## 2024-07-28 ENCOUNTER — Telehealth (HOSPITAL_BASED_OUTPATIENT_CLINIC_OR_DEPARTMENT_OTHER): Payer: Self-pay | Admitting: *Deleted

## 2024-07-28 ENCOUNTER — Encounter: Payer: Self-pay | Admitting: Urology

## 2024-07-28 NOTE — Telephone Encounter (Signed)
 Call patient ,VM left requesting a return call to the office regarding appt on 07/29/2024 .Calling to confirm if sleep study has been completed.

## 2024-07-28 NOTE — Patient Instructions (Signed)
 Prostate Cancer  The prostate is a small gland that helps make semen. It is located below a man's bladder, in front of the rectum. Prostate cancer is when abnormal cells grow in this gland. What are the causes? The cause of this condition is not known. What increases the risk? Being age 73 or older. Having a family history of prostate cancer. Having a family history of cancer of the breasts or ovaries. Having genes that are passed from parent to child (inherited). Having Lynch syndrome. African American men and men of African descent are diagnosed with prostate cancer at higher rates than other men. What are the signs or symptoms? Problems peeing (urinating). This may include: A stream that is weak, or pee that stops and starts. Trouble starting or stopping your pee. Trouble emptying all of your pee. Needing to pee more often, especially at night. Blood in your pee or semen. Pain in the: Lower back. Lower belly (abdomen). Hips. Trouble getting an erection. Weakness or numbness in the legs or feet. How is this treated? Treatment for this condition depends on: How much the cancer has spread. Your age. The kind of treatment you want. Your health. Treatments include: Being watched. This is called observation. You will be tested from time to time, but you will not get treated. Tests are to make sure that the cancer is not growing. Surgery. This may be done to: Take out (remove) the prostate. Freeze and kill cancer cells. Radiation. This uses a strong beam of energy to kill cancer cells. Chemotherapy. This uses medicines that stop cancer cells from increasing. This kills cancer cells and healthy cells. Targeted therapy. This kills cancer cells only. Healthy cells are not affected. Hormone treatment. This stops the body from making hormones that help the cancer cells grow. Follow these instructions at home: Lifestyle Do not smoke or use any products that contain nicotine or  tobacco. If you need help quitting, ask your doctor. Eat a healthy diet. Treatment may affect your ability to have sex. If you have a partner, touch, hold, hug, and caress your partner to have intimate moments. Get plenty of sleep. Ask your doctor for help to find a support group for men with prostate cancer. General instructions Take over-the-counter and prescription medicines only as told by your doctor. If you have to go to the hospital, let your cancer doctor (oncologist) know. Keep all follow-up visits. Where to find more information American Cancer Society: www.cancer.org American Society of Clinical Oncology: www.cancer.net [employer]: www.cancer.gov Contact a doctor if: You have new or more trouble peeing. You have new or more blood in your pee. You have new or more pain in your hips, back, or chest. Get help right away if: You have weakness in your legs. You lose feeling in your legs. You cannot control your pee or your poop (stool). You have chills or a fever. Summary The prostate is a male gland that helps make semen. Prostate cancer is when abnormal cells grow in this gland. Treatment includes doing surgery, using medicines, using strong beams of energy, or watching without treatment. Ask your doctor for help to find a support group for men with prostate cancer. Contact a doctor if you have problems peeing or have any new pain that you did not have before. This information is not intended to replace advice given to you by your health care provider. Make sure you discuss any questions you have with your health care provider. Document Revised: 10/06/2020 Document Reviewed: 10/06/2020  Elsevier Patient Education  2024 [employer].

## 2024-07-29 ENCOUNTER — Encounter (HOSPITAL_BASED_OUTPATIENT_CLINIC_OR_DEPARTMENT_OTHER): Payer: Self-pay

## 2024-07-29 ENCOUNTER — Ambulatory Visit (HOSPITAL_BASED_OUTPATIENT_CLINIC_OR_DEPARTMENT_OTHER)

## 2024-07-29 ENCOUNTER — Encounter (HOSPITAL_BASED_OUTPATIENT_CLINIC_OR_DEPARTMENT_OTHER)

## 2024-07-29 ENCOUNTER — Ambulatory Visit (INDEPENDENT_AMBULATORY_CARE_PROVIDER_SITE_OTHER)

## 2024-07-29 VITALS — BP 117/69 | HR 72 | Ht 67.0 in | Wt 229.0 lb

## 2024-07-29 DIAGNOSIS — J42 Unspecified chronic bronchitis: Secondary | ICD-10-CM | POA: Diagnosis not present

## 2024-07-29 DIAGNOSIS — G4719 Other hypersomnia: Secondary | ICD-10-CM

## 2024-07-29 DIAGNOSIS — R942 Abnormal results of pulmonary function studies: Secondary | ICD-10-CM

## 2024-07-29 DIAGNOSIS — Z87891 Personal history of nicotine dependence: Secondary | ICD-10-CM

## 2024-07-29 DIAGNOSIS — G4710 Hypersomnia, unspecified: Secondary | ICD-10-CM | POA: Diagnosis not present

## 2024-07-29 LAB — PULMONARY FUNCTION TEST
DL/VA % pred: 129 %
DL/VA: 5.28 ml/min/mmHg/L
DLCO unc % pred: 73 %
DLCO unc: 17.05 ml/min/mmHg
FEF 25-75 Post: 1.16 L/s
FEF 25-75 Pre: 1.04 L/s
FEF2575-%Change-Post: 11 %
FEF2575-%Pred-Post: 55 %
FEF2575-%Pred-Pre: 49 %
FEV1-%Change-Post: 7 %
FEV1-%Pred-Post: 52 %
FEV1-%Pred-Pre: 49 %
FEV1-Post: 1.49 L
FEV1-Pre: 1.38 L
FEV1FVC-%Change-Post: -1 %
FEV1FVC-%Pred-Pre: 99 %
FEV6-%Change-Post: 10 %
FEV6-%Pred-Post: 57 %
FEV6-%Pred-Pre: 52 %
FEV6-Post: 2.08 L
FEV6-Pre: 1.88 L
FEV6FVC-%Pred-Post: 106 %
FEV6FVC-%Pred-Pre: 106 %
FVC-%Change-Post: 9 %
FVC-%Pred-Post: 53 %
FVC-%Pred-Pre: 49 %
FVC-Post: 2.08 L
FVC-Pre: 1.9 L
Post FEV1/FVC ratio: 72 %
Post FEV6/FVC ratio: 100 %
Pre FEV1/FVC ratio: 73 %
Pre FEV6/FVC Ratio: 100 %
RV % pred: 127 %
RV: 2.97 L
TLC % pred: 82 %
TLC: 5.27 L

## 2024-07-29 MED ORDER — TIOTROPIUM BROMIDE-OLODATEROL 2.5-2.5 MCG/ACT IN AERS: 2.0000 | INHALATION_SPRAY | Freq: Every day | RESPIRATORY_TRACT | 3 refills | Status: AC

## 2024-07-29 NOTE — Progress Notes (Signed)
 Full PFT performed today.

## 2024-07-29 NOTE — Patient Instructions (Addendum)
 Complete home sleep test; plan for follow up in 6-8 weeks to review results.  Start Stiolto 2 puffs inhaled once daily.  Complete Chest CT prior to follow up appointment.

## 2024-07-29 NOTE — Progress Notes (Signed)
 "  @Patient  ID: Steven [name], male    DOB: [birth year], 74 y.o.   MRN: [id number]  Chief Complaint  Patient presents with   Follow-up    Referring provider: Terry Wilhelmena Lloyd Hilario, FNP  HPI: Discussed the use of AI scribe software for clinical note transcription with the patient, who gave verbal consent to proceed.  History of Present Illness Steven [name] is a 74 year old male with chronic bronchitis who presents with ongoing breathing issues and sleep disturbances. He was referred by his family physician for evaluation of sleep apnea and chronic bronchitis.  He experiences ongoing breathing difficulties, including chronic bronchitis and congestion, with a persistent cough and occasional wheezing, particularly at night. He describes a sensation in his throat akin to 'blowing bubbles under water ' or a 'growl.' He uses a portable nebulizer that provides some relief, although he has not used it recently. He is currently using albuterol  and DuoNebs as needed, typically once during the day and before bed.  He has a significant history of smoking, having quit approximately six years ago after smoking for about 20 to 30 years since the age of 61. He notes that he never fully inhaled due to coughing.  He experiences fatigue and gets tired easily when performing activities such as cleaning his car, taking long walks, or doing household chores like washing dishes or mopping the floor. No significant shortness of breath but he does feel tired easily. No recent significant changes in his breathing patterns.  He is scheduled for cataract surgery and has been advised by his neck and spine doctor to undergo an MRI. He feels overwhelmed by the number of tests and procedures he is undergoing. He has not completed his HST yet.  He completed PFTs at today's appt were reviewed with him.  Last OV 05/27/2024: Steven [name] is a 74 year old male with chronic bronchitis who presents with sleep disturbances.    He experiences significant sleep disturbances, including nightmares, sleep talking, and performing actions such as 'looking for keys' while asleep. His fiance has observed loud snoring and episodes of jumping in his sleep. No witnessed apneas, but he occasionally wakes up coughing or gasping for breath. He typically goes to bed between 11 PM and 12 AM and wakes up in the afternoon, often feeling unrested and requiring naps during the day.   He has a history of chronic bronchitis and reports ongoing congestion, which he attributes to allergies and environmental factors. He has received prednisone  shots in the past and currently uses a nasal spray (Astelin ) and a portable nebulizer with Duonebs, though he does not find these treatments particularly effective. He has not had a recent pulmonary function test but recalls using an incentive spirometer during a past hospital stay. He experiences shortness of breath with activity, which he attributes to his respiratory issues.   His past medical history includes atrial flutter, for which he underwent cardiac ablation in May of the previous year. He also has a history of high blood pressure and was treated for prostate cancer. He quit smoking five years ago after a 20-year history of smoking.   He has a history of IBS and thyroid  issues, which he believes contribute to fluctuations in his weight. His weight recently dropped to 170 pounds, though he does not perceive himself as eating excessively. No palpitations or heart rhythm problems currently. No history of heart attacks or strokes.         TEST/EVENTS :  PET  scan 03/27/2024:  IMPRESSION: 1. Intense radiotracer activity within the prostate gland consistent with recurrent primary prostate adenocarcinoma. 2. No evidence of metastatic adenopathy in the pelvis or periaortic retroperitoneum. 3. No evidence of visceral metastasis. 4. Single focus of radiotracer activity in the anterior LEFT rib with subtle  sclerotic lesion on CT portion exam. Differential includes prostate cancer skeletal metastasis versus benign fibro-osseous lesion or posttraumatic site. 5. Mild dilatation of the LEFT ureter with ectopic insertion.  Allergies[1]  Immunization History  Administered Date(s) Administered   Fluad Quad(high Dose 65+) 05/07/2023   INFLUENZA, HIGH DOSE SEASONAL PF 04/30/2024   Influenza,inj,quad, With Preservative 04/03/2019   Influenza-Unspecified 05/26/2020   Moderna Sars-Covid-2 Vaccination 09/07/2019, 10/12/2019    Past Medical History:  Diagnosis Date   Anxiety    Arthritis    Atrial fibrillation (HCC)    Atrial fibrillation (HCC)    Atrial flutter (HCC)    BPH (benign prostatic hyperplasia)    Chest pain    Dizziness    Dyspnea    laying down occ   Dysrhythmia    Fracture 08/17/2015   MULTIPLE RIB FRACTURES     FROM FALL    GERD (gastroesophageal reflux disease)    Hemorrhoids    History of kidney stones    noted on CT scan   History of radiation therapy 08/22/11-10/13/11   prostate   Hyperlipemia    Hypertension    Hyperthyroidism    IBS (irritable bowel syndrome)    Insomnia    Light headedness    Migraine    Numbness and tingling in left arm    Numbness and tingling of both legs    Open fracture of left elbow 08/18/2015   Pre-diabetes    Prostate cancer (HCC)    prostate s/p radiation Mar 2013   Rib fractures 08/17/2015   Tinnitus     Tobacco History: Tobacco Use History[2] Counseling given: Not Answered Tobacco comments: Quit smoking x 2 years    07/2015   SOMETIIMES i USE VAPOR  Smoking Cessation Classes, Agencies, Services & Resources Offered   Outpatient Medications Prior to Visit  Medication Sig Dispense Refill   acetaminophen  (TYLENOL ) 500 MG tablet Take 1,000 mg by mouth every 8 (eight) hours as needed for moderate pain (pain score 4-6).     albuterol  (VENTOLIN  HFA) 108 (90 Base) MCG/ACT inhaler Inhale 2 puffs into the lungs every 4 (four) hours  as needed. 18 g 2   alfuzosin  (UROXATRAL ) 10 MG 24 hr tablet Take 1 tablet (10 mg total) by mouth daily with breakfast. 90 tablet 3   amLODipine  (NORVASC ) 10 MG tablet Take 1 tablet (10 mg total) by mouth daily. 90 tablet 3   azelastine  (ASTELIN ) 0.1 % nasal spray Place 1 spray into both nostrils 2 (two) times daily. Use in each nostril as directed 30 mL 0   chlorhexidine  (HIBICLENS ) 4 % external liquid Apply a dime size amount of the solution to warm water  and cleanse the affected areas twice daily while symptoms persist. 118 mL 0   Cholecalciferol (VITAMIN D3) 125 MCG (5000 UT) CAPS Take 5,000 Units by mouth daily.     doxycycline  (VIBRAMYCIN ) 100 MG capsule Take 1 capsule (100 mg total) by mouth 2 (two) times daily. 60 capsule 1   DULoxetine  (CYMBALTA ) 60 MG capsule Take 1 capsule (60 mg total) by mouth daily. 90 capsule 3   ELIQUIS  5 MG TABS tablet TAKE 1 TABLET(5 MG) BY MOUTH TWICE DAILY 60 tablet 5  ezetimibe  (ZETIA ) 10 MG tablet Take 1 tablet (10 mg total) by mouth daily. 90 tablet 0   furosemide  (LASIX ) 20 MG tablet Take 1 tablet (20 mg total) by mouth daily. Take with or after meal. 90 tablet 1   gabapentin  (NEURONTIN ) 400 MG capsule Take 1 capsule (400 mg total) by mouth 3 (three) times daily. 90 capsule 3   ipratropium-albuterol  (DUONEB) 0.5-2.5 (3) MG/3ML SOLN Take 3 mLs by nebulization every 4 (four) hours as needed. 360 mL 2   ketoconazole  (NIZORAL ) 2 % cream Apply topically 2 (two) times daily. 30 g 3   losartan  (COZAAR ) 100 MG tablet Take 1 tablet (100 mg total) by mouth daily. 90 tablet 1   metoprolol  tartrate (LOPRESSOR ) 25 MG tablet TAKE 1 TABLET(25 MG) BY MOUTH TWICE DAILY 180 tablet 1   mirtazapine  (REMERON ) 15 MG tablet TAKE 1 TABLET(15 MG) BY MOUTH AT BEDTIME 30 tablet 2   omeprazole  (PRILOSEC) 20 MG capsule Take 20 mg by mouth daily.     ondansetron  (ZOFRAN ) 4 MG tablet Take 1 tablet (4 mg total) by mouth every 8 (eight) hours as needed for nausea or vomiting. 20 tablet 1    phenazopyridine  (PYRIDIUM ) 200 MG tablet Take 1 tablet (200 mg total) by mouth 3 (three) times daily as needed for pain. 6 tablet 0   polyethylene glycol (MIRALAX  / GLYCOLAX ) 17 g packet Take 17 g by mouth daily. 30 each 2   potassium chloride  SA (KLOR-CON  M) 20 MEQ tablet Take 1 tablet (20 mEq total) by mouth daily. 30 tablet 0   promethazine -dextromethorphan  (PROMETHAZINE -DM) 6.25-15 MG/5ML syrup Take 5 mLs by mouth 4 (four) times daily as needed. 118 mL 0   spironolactone  (ALDACTONE ) 25 MG tablet Take 1 tablet (25 mg total) by mouth daily. 90 tablet 3   triamcinolone  cream (KENALOG ) 0.1 % Apply 1 Application topically 2 (two) times daily. 30 g 0   vitamin B-12 (CYANOCOBALAMIN ) 100 MCG tablet Take 100 mcg by mouth daily.     No facility-administered medications prior to visit.     Review of Systems: as per hpi  Constitutional:   No  weight loss, night sweats,  Fevers, chills, fatigue, or  lassitude.  HEENT:   No headaches,  Difficulty swallowing,  Tooth/dental problems, or  Sore throat,                No sneezing, itching, ear ache, nasal congestion, post nasal drip,   CV:  No chest pain,  Orthopnea, PND, swelling in lower extremities, anasarca, dizziness, palpitations, syncope.   GI  No heartburn, indigestion, abdominal pain, nausea, vomiting, diarrhea, change in bowel habits, loss of appetite, bloody stools.   Resp: No shortness of breath with exertion or at rest.  No excess mucus, no productive cough,  No non-productive cough,  No coughing up of blood.  No change in color of mucus.  No wheezing.  No chest wall deformity  Skin: no rash or lesions.  GU: no dysuria, change in color of urine, no urgency or frequency.  No flank pain, no hematuria   MS:  No joint pain or swelling.  No decreased range of motion.  No back pain.    Physical Exam  BP 117/69   Pulse 72   Ht 5' 7 (1.702 m)   Wt 229 lb (103.9 kg)   SpO2 96%   BMI 35.87 kg/m   GEN: A/Ox3; pleasant , NAD, well  nourished    HEENT:  Brown/AT,  EACs-clear, TMs-wnl, NOSE-clear,  THROAT-clear, no lesions, no postnasal drip or exudate noted.   NECK:  Supple w/ fair ROM; no JVD; normal carotid impulses w/o bruits; no thyromegaly or nodules palpated; no lymphadenopathy.    RESP  Few scattered wheezes throughout P & A; w/o,ales/ or rhonchi. no accessory muscle use, no dullness to percussion  CARD:  RRR, no m/r/g, no peripheral edema, pulses intact, no cyanosis or clubbing.  GI:   Soft & nt; nml bowel sounds; no organomegaly or masses detected.   Musco: Warm bil, no deformities or joint swelling noted.   Neuro: alert, no focal deficits noted.    Skin: Warm, no lesions or rashes    Lab Results:  CBC    Component Value Date/Time   WBC 5.2 04/21/2024 1835   RBC 5.13 04/21/2024 1835   HGB 14.7 04/21/2024 1835   HGB 13.2 01/13/2024 1341   HCT 46.5 04/21/2024 1835   HCT 41.6 01/13/2024 1341   PLT 262 04/21/2024 1835   PLT 213 01/13/2024 1341   MCV 90.6 04/21/2024 1835   MCV 90 01/13/2024 1341   MCH 28.7 04/21/2024 1835   MCHC 31.6 04/21/2024 1835   RDW 13.1 04/21/2024 1835   RDW 12.8 01/13/2024 1341   LYMPHSABS 1.7 04/21/2024 1835   LYMPHSABS 2.3 10/18/2023 1501   MONOABS 0.5 04/21/2024 1835   EOSABS 0.1 04/21/2024 1835   EOSABS 0.1 10/18/2023 1501   BASOSABS 0.0 04/21/2024 1835   BASOSABS 0.0 10/18/2023 1501    BMET    Component Value Date/Time   NA 137 04/21/2024 1835   NA 144 01/13/2024 1341   K 3.5 04/21/2024 1835   CL 99 04/21/2024 1835   CO2 31 04/21/2024 1835   GLUCOSE 101 (H) 04/21/2024 1835   BUN 8 04/21/2024 1835   BUN 8 01/13/2024 1341   CREATININE 0.93 04/21/2024 1835   CALCIUM  9.2 04/21/2024 1835   GFRNONAA >60 04/21/2024 1835   GFRAA >60 02/19/2020 1622    BNP    Component Value Date/Time   BNP 27.0 04/21/2024 1835    ProBNP No results found for: PROBNP  Imaging: No results found.  Administration History     None          Latest Ref Rng &  Units 07/29/2024   10:03 AM  PFT Results  FVC-Pre L 1.90  P  FVC-Predicted Pre % 49  P  FVC-Post L 2.08  P  FVC-Predicted Post % 53  P  Pre FEV1/FVC % % 73  P  Post FEV1/FCV % % 72  P  FEV1-Pre L 1.38  P  FEV1-Predicted Pre % 49  P  FEV1-Post L 1.49  P  DLCO uncorrected ml/min/mmHg 17.05  P  DLCO UNC% % 73  P  DLVA Predicted % 129  P  TLC L 5.27  P  TLC % Predicted % 82  P  RV % Predicted % 127  P    P Preliminary result    No results found for: NITRICOXIDE   Assessment & Plan:   Assessment & Plan Excessive daytime sleepiness  Chronic bronchitis, unspecified chronic bronchitis type (HCC)  Abnormal PFT  Assessment and Plan Assessment & Plan Sleep disturbance with possible sleep apnea Home sleep test was ordered but not yet completed due to confusion about the process. - Coordinated with patient care coordinator to set up home sleep test pickup and return process. - He was sent home with a Watchpat device today.  Chronic bronchitis Likely secondary to smoking history. Lung  function tests show restrictive and obstructive components with decreased oxygen  and carbon dioxide exchange. Current treatment with albuterol  and DuoNeb provides some relief but not clinically significant improvement noted on PFT. Discussed potential benefit of a long-acting inhaler (Stiolto) to manage symptoms more effectively. Consideration of chest CT to evaluate for structural lung disease due to smoking history. - Prescribed Stiolto inhaler for once daily use. - Ordered chest CT to evaluate for structural lung disease. - Continue albuterol  and DuoNeb as needed for acute symptoms.  Return in about 8 weeks (around 09/23/2024) for CT results, sleep study review.  Candis Dandy, PA-C 07/29/2024      [1]  Allergies Allergen Reactions   Latex Rash and Other (See Comments)    Possible reaction to latex gloves per patient   Meclizine Other (See Comments)    Other reaction(s): Abdominal Pain, Other   [2]  Social History Tobacco Use  Smoking Status Former   Current packs/day: 0.00   Average packs/day: 1 pack/day for 20.0 years (20.0 ttl pk-yrs)   Types: Cigarettes   Start date: 2000   Quit date: 2020   Years since quitting: 6.0   Passive exposure: Past  Smokeless Tobacco Never  Tobacco Comments   Quit smoking x 2 years    07/2015   SOMETIIMES i USE VAPOR    Smoking Cessation Classes, Agencies, [employer] Offered   "

## 2024-07-29 NOTE — Patient Instructions (Signed)
 Full PFT performed today.

## 2024-07-30 ENCOUNTER — Other Ambulatory Visit: Payer: Self-pay

## 2024-07-30 ENCOUNTER — Encounter (HOSPITAL_COMMUNITY): Payer: Self-pay

## 2024-07-30 ENCOUNTER — Encounter (HOSPITAL_COMMUNITY)
Admission: RE | Admit: 2024-07-30 | Discharge: 2024-07-30 | Disposition: A | Discharge status: Home / Self Care | Source: Ambulatory Visit | Attending: Ophthalmology | Admitting: Ophthalmology

## 2024-07-31 NOTE — H&P (Signed)
 Surgical History & Physical  Patient Name: Steven [name]  DOB: [birth year]  Surgery: Cataract extraction with intraocular lens implant phacoemulsification; Right Eye Surgeon: Lynwood Hermann MD Surgery Date: 08/04/2024 Pre-Op Date: 06/09/2024  HPI: A 84 Yr. old male patient 1. The patient is here for a cataract evaluation. Ref: [facility] in [place]. Pt. complains of difficulty when driving due to glare from headlights or sun. Both eyes are affected. The episode is constant. Symptoms occur when the patient is driving and reading. Near vision is worse than distance. The condition is better when transitioning from dark to light. Pt. has been having irritated eyes, watery, itching x several months. The complaint is associated with blurry vision. This is negatively affecting the patient's quality of life and the patient is unable to function adequately in life with the current level of vision. HPI Completed by Dr. Lynwood Hermann  Medical History: Cataracts  Heart Problem High Blood Pressure LDL Thyroid  Problems  Review of Systems Cardiovascular High Blood Pressure, Irregular Heart beat, A-fib Endocrine Hyperthyroidism, cholesterol  Social Former smoker of Cigarettes   Medication Prednisolone-moxiflox-bromfen,  Cetirizine , Ipratropium-albuterol , Promethazine -DM, Benzonatate , Gabapentin , Amlodipine , Azelastine , Albuterol  sulfate, Prednisone , Ketoconazole , Methimazole , Losartan , Diazepam , Furosemide , Ezetimibe , Metoprolol  tartrate, Eliquis , Duloxetine , Alfuzosin   Sx/Procedures Biopsy  Drug Allergies  Meclizine, Latex  History & Physical: Heent: cataracts NECK: supple without bruits LUNGS: lungs clear to auscultation CV: regular rate and rhythm Abdomen: soft and non-tender  Impression & Plan: Assessment: 1.  COMBINED FORMS AGE RELATED CATARACT; Both Eyes (H25.813) 2.  OAG BORDERLINE FINDINGS HIGH RISK; Both Eyes (H40.023) 3.  BLEPHARITIS; Right Upper Lid, Right Lower Lid, Left Upper  Lid, Left Lower Lid (H01.001, H01.002,H01.004,H01.005) 4.  CONJUNCTIVOCHALASIS; Both Eyes (H11.823) 5.  ARCUS SENILIS; Both Eyes (H18.413) 6.  ASTIGMATISM, REGULAR; Both Eyes (H52.223)  Plan: 1.  Cataract accounts for the patient's decreased vision. This visual impairment is not correctable with a tolerable change in glasses or contact lenses. Cataract surgery with an implantation of a new lens should significantly improve the visual and functional status of the patient. Recommend phacoemulsification with intraocular lens. Discussed all risks, benefits, alternatives, and potential complications. Discussed the procedures and recovery. The patient desires to have surgery. A-scan/Biometry ordered and will be performed for intraocular lens calculations. The surgery will be performed in order to improve vision for driving, reading, and for eye examinations. Recommend Dextenza  for post-operative pain and inflammation. Educational materials provided: Cataract. History of corneal refractive Surgery: None History of Previous Ocular Surgery (PPV, other): None History of ocular trauma: None Use of Eye Pressure Lowering Drops: None No current contact lens use. Pupil Status: Dilates well - shugarcaine or Lidocaine +Omidira by protocol Right Eye worse. OD first, then OS. Recommend Toric Lens OU.  2.  Based on cup-to-disc ratio. Negative Family history. OCT rNFL today shows: thinning OU. Will require HVF 24-2 and repeat OCT rNFL after Surgery. Detailed discussion about glaucoma today including importance of maintaining good follow up and following treatment plan, and the possibility of irreversible blindness as part of this disease process.  3.  Blepharitis is present - recommend regular lid cleaning.  4.  Asymptomatic. Preservative Free Artificial tears 1 drop 2-3x/day.  5.  Discussed significance of finding  6.  Explained astigmatism to patient. Recommend Toric Lens OU.

## 2024-08-04 ENCOUNTER — Encounter (HOSPITAL_COMMUNITY): Payer: Self-pay | Admitting: Anesthesiology

## 2024-08-04 ENCOUNTER — Ambulatory Visit (HOSPITAL_COMMUNITY)
Admission: RE | Admit: 2024-08-04 | Discharge: 2024-08-04 | Disposition: A | Attending: Ophthalmology | Admitting: Ophthalmology

## 2024-08-04 ENCOUNTER — Encounter (HOSPITAL_COMMUNITY): Admission: RE | Disposition: A | Payer: Self-pay | Source: Home / Self Care | Attending: Ophthalmology

## 2024-08-04 ENCOUNTER — Encounter (HOSPITAL_COMMUNITY): Payer: Self-pay | Admitting: Ophthalmology

## 2024-08-04 DIAGNOSIS — F32A Depression, unspecified: Secondary | ICD-10-CM | POA: Diagnosis not present

## 2024-08-04 DIAGNOSIS — I4891 Unspecified atrial fibrillation: Secondary | ICD-10-CM | POA: Diagnosis not present

## 2024-08-04 DIAGNOSIS — H18413 Arcus senilis, bilateral: Secondary | ICD-10-CM | POA: Diagnosis not present

## 2024-08-04 DIAGNOSIS — Z87891 Personal history of nicotine dependence: Secondary | ICD-10-CM | POA: Insufficient documentation

## 2024-08-04 DIAGNOSIS — I10 Essential (primary) hypertension: Secondary | ICD-10-CM | POA: Insufficient documentation

## 2024-08-04 DIAGNOSIS — H25811 Combined forms of age-related cataract, right eye: Secondary | ICD-10-CM | POA: Diagnosis present

## 2024-08-04 DIAGNOSIS — H40023 Open angle with borderline findings, high risk, bilateral: Secondary | ICD-10-CM | POA: Diagnosis not present

## 2024-08-04 DIAGNOSIS — G4730 Sleep apnea, unspecified: Secondary | ICD-10-CM | POA: Diagnosis not present

## 2024-08-04 DIAGNOSIS — H25813 Combined forms of age-related cataract, bilateral: Secondary | ICD-10-CM | POA: Insufficient documentation

## 2024-08-04 DIAGNOSIS — H0100B Unspecified blepharitis left eye, upper and lower eyelids: Secondary | ICD-10-CM | POA: Diagnosis not present

## 2024-08-04 DIAGNOSIS — H52223 Regular astigmatism, bilateral: Secondary | ICD-10-CM | POA: Insufficient documentation

## 2024-08-04 DIAGNOSIS — H0100A Unspecified blepharitis right eye, upper and lower eyelids: Secondary | ICD-10-CM | POA: Insufficient documentation

## 2024-08-04 DIAGNOSIS — F419 Anxiety disorder, unspecified: Secondary | ICD-10-CM | POA: Insufficient documentation

## 2024-08-04 DIAGNOSIS — H11823 Conjunctivochalasis, bilateral: Secondary | ICD-10-CM | POA: Diagnosis not present

## 2024-08-04 HISTORY — PX: CATARACT EXTRACTION W/PHACO: SHX586

## 2024-08-04 LAB — GLUCOSE, CAPILLARY: Glucose-Capillary: 104 mg/dL — ABNORMAL HIGH (ref 70–99)

## 2024-08-04 SURGERY — PHACOEMULSIFICATION, CATARACT, WITH IOL INSERTION
Anesthesia: Monitor Anesthesia Care | Site: Eye | Laterality: Right

## 2024-08-04 MED ORDER — PHENYLEPHRINE HCL 2.5 % OP SOLN
1.0000 [drp] | OPHTHALMIC | Status: AC | PRN
  Administered 2024-08-04 (×3): 1 [drp] via OPHTHALMIC

## 2024-08-04 MED ORDER — SODIUM HYALURONATE 10 MG/ML IO SOLUTION
PREFILLED_SYRINGE | INTRAOCULAR | Status: DC | PRN
  Administered 2024-08-04: .85 mL via INTRAOCULAR

## 2024-08-04 MED ORDER — SODIUM HYALURONATE 23MG/ML IO SOSY
PREFILLED_SYRINGE | INTRAOCULAR | Status: DC | PRN
  Administered 2024-08-04: .6 mL via INTRAOCULAR

## 2024-08-04 MED ORDER — TROPICAMIDE 1 % OP SOLN
1.0000 [drp] | OPHTHALMIC | Status: AC | PRN
  Administered 2024-08-04 (×3): 1 [drp] via OPHTHALMIC

## 2024-08-04 MED ORDER — TETRACAINE HCL 0.5 % OP SOLN
1.0000 [drp] | OPHTHALMIC | Status: AC | PRN
  Administered 2024-08-04 (×3): 1 [drp] via OPHTHALMIC

## 2024-08-04 MED ORDER — MOXIFLOXACIN HCL 5 MG/ML IO SOLN
INTRAOCULAR | Status: DC | PRN
  Administered 2024-08-04: .2 mL via INTRACAMERAL

## 2024-08-04 MED ORDER — POVIDONE-IODINE 5 % OP SOLN
OPHTHALMIC | Status: DC | PRN
  Administered 2024-08-04: 1 via OPHTHALMIC

## 2024-08-04 MED ORDER — LACTATED RINGERS IV SOLN: INTRAVENOUS | Status: DC

## 2024-08-04 MED ORDER — TETRACAINE 0.5 % OP SOLN OPTIME - NO CHARGE
OPHTHALMIC | Status: DC | PRN
  Administered 2024-08-04: 4 [drp] via OPHTHALMIC

## 2024-08-04 MED ORDER — LIDOCAINE HCL 3.5 % OP GEL
1.0000 | Freq: Once | OPHTHALMIC | Status: AC
  Administered 2024-08-04: 1 via OPHTHALMIC

## 2024-08-04 MED ORDER — LIDOCAINE HCL (PF) 1 % IJ SOLN
INTRAMUSCULAR | Status: DC | PRN
  Administered 2024-08-04: 1 mL

## 2024-08-04 MED ORDER — PHENYLEPHRINE-KETOROLAC 1-0.3 % IO SOLN
INTRAOCULAR | Status: DC | PRN
  Administered 2024-08-04: 500 mL via OPHTHALMIC

## 2024-08-04 MED ORDER — BSS IO SOLN
INTRAOCULAR | Status: DC | PRN
  Administered 2024-08-04: 15 mL via INTRAOCULAR

## 2024-08-04 MED ORDER — STERILE WATER FOR IRRIGATION IR SOLN
Status: DC | PRN
  Administered 2024-08-04: 1

## 2024-08-04 SURGICAL SUPPLY — 10 items
CLOTH BEACON ORANGE TIMEOUT ST (SAFETY) ×1 IMPLANT
EYE SHIELD UNIVERSAL CLEAR (GAUZE/BANDAGES/DRESSINGS) IMPLANT
FEE CATARACT SUITE SIGHTPATH (MISCELLANEOUS) ×1 IMPLANT
GLOVE BIOGEL PI IND STRL 7.0 (GLOVE) ×2 IMPLANT
LENS IOL TECNIS EYHANCE 21.0 (Intraocular Lens) IMPLANT
NEEDLE HYPO 18GX1.5 BLUNT FILL (NEEDLE) ×1 IMPLANT
PAD ARMBOARD POSITIONER FOAM (MISCELLANEOUS) ×1 IMPLANT
SYR TB 1ML LL NO SAFETY (SYRINGE) ×1 IMPLANT
TAPE SURG TRANSPORE 1 IN (GAUZE/BANDAGES/DRESSINGS) IMPLANT
WATER STERILE IRR 250ML POUR (IV SOLUTION) ×1 IMPLANT

## 2024-08-04 NOTE — Transfer of Care (Signed)
 Immediate Anesthesia Transfer of Care Note  Patient: Steven [name]  Procedure(s) Performed: PHACOEMULSIFICATION, CATARACT, WITH IOL INSERTION (Right: Eye)  Patient Location: Short Stay  Anesthesia Type:MAC  Level of Consciousness: awake, alert , oriented, and patient cooperative  Airway & Oxygen  Therapy: Patient Spontanous Breathing  Post-op Assessment: Report given to RN and Post -op Vital signs reviewed and stable  Post vital signs: Reviewed and stable  Last Vitals:  Vitals Value Taken Time  BP 134/75 08/04/24 11:07  Temp    Pulse 66 08/04/24 11:07  Resp 12 08/04/24 11:07  SpO2 98% on RA 08/04/24 11:07    Last Pain:  Vitals:   08/04/24 1001  TempSrc: Oral  PainSc: 0-No pain         Complications: No notable events documented.

## 2024-08-04 NOTE — Anesthesia Preprocedure Evaluation (Signed)
"                                    Anesthesia Evaluation  Patient identified by MRN, date of birth, ID band Patient awake    Reviewed: Allergy & Precautions, H&P , NPO status , Patient's Chart, lab work & pertinent test results, reviewed documented beta blocker date and time   Airway Mallampati: II  TM Distance: >3 FB Neck ROM: full    Dental no notable dental hx.    Pulmonary neg pulmonary ROS, shortness of breath, sleep apnea , former smoker   Pulmonary exam normal breath sounds clear to auscultation       Cardiovascular Exercise Tolerance: Good hypertension, negative cardio ROS + dysrhythmias Atrial Fibrillation  Rhythm:regular Rate:Normal     Neuro/Psych  Headaches PSYCHIATRIC DISORDERS Anxiety Depression     Neuromuscular disease negative neurological ROS  negative psych ROS   GI/Hepatic negative GI ROS, Neg liver ROS,GERD  ,,  Endo/Other  negative endocrine ROS Hyperthyroidism   Renal/GU negative Renal ROS  negative genitourinary   Musculoskeletal   Abdominal   Peds  Hematology negative hematology ROS (+)   Anesthesia Other Findings   Reproductive/Obstetrics negative OB ROS                              Anesthesia Physical Anesthesia Plan  ASA: 3  Anesthesia Plan: MAC   Post-op Pain Management:    Induction:   PONV Risk Score and Plan:   Airway Management Planned:   Additional Equipment:   Intra-op Plan:   Post-operative Plan:   Informed Consent: I have reviewed the patients History and Physical, chart, labs and discussed the procedure including the risks, benefits and alternatives for the proposed anesthesia with the patient or authorized representative who has indicated his/her understanding and acceptance.     Dental Advisory Given  Plan Discussed with: CRNA  Anesthesia Plan Comments:         Anesthesia Quick Evaluation  "

## 2024-08-04 NOTE — Discharge Instructions (Signed)
 Please discharge patient when stable, will follow up today with Dr. June Leap at the [facility] office immediately following discharge.  Leave shield in place until visit.  All paperwork with discharge instructions will be given at the office.  [facility] Address:  [address]  [place], [place] [address]

## 2024-08-04 NOTE — Interval H&P Note (Signed)
 History and Physical Interval Note:  08/04/2024 10:03 AM  Steven [name]  has presented today for surgery, with the diagnosis of combined forms age related cataract, right eye.  The various methods of treatment have been discussed with the patient and family. After consideration of risks, benefits and other options for treatment, the patient has consented to  Procedures: PHACOEMULSIFICATION, CATARACT, WITH IOL INSERTION (Right) as a surgical intervention.  The patient's history has been reviewed, patient examined, no change in status, stable for surgery.  I have reviewed the patient's chart and labs.  Questions were answered to the patient's satisfaction.     HARRIE AGENT

## 2024-08-04 NOTE — Op Note (Signed)
 Date of procedure: 08/04/2024  Pre-operative diagnosis:  Visually significant combined form age-related cataract, Right Eye (H25.811)  Post-operative diagnosis:  Visually significant combined form age-related cataract, Right Eye (H25.811)  Procedure: Removal of cataract via phacoemulsification and insertion of intra-ocular lens [employer] DIB00 +21.0D into the capsular bag of the Right Eye  Attending surgeon: Lynwood LABOR. Nariyah Osias, MD, MA  Anesthesia: MAC, Topical Akten   Complications: None  Estimated Blood Loss: <32mL (minimal)  Specimens: None  Implants: As above  Indications:  Visually significant age-related cataract, Right Eye  Procedure:  The patient was seen and identified in the pre-operative area. The operative eye was identified and dilated.  The operative eye was marked.  Topical anesthesia was administered to the operative eye.     The patient was then to the operative suite and placed in the supine position.  A timeout was performed confirming the patient, procedure to be performed, and all other relevant information.   The patient's face was prepped and draped in the usual fashion for intra-ocular surgery.  A lid speculum was placed into the operative eye and the surgical microscope moved into place and focused.  A superotemporal paracentesis was created using a 20 gauge paracentesis blade. Omidria  was injected into the anterior chamber. Shugarcaine was injected into the anterior chamber.  Viscoelastic was injected into the anterior chamber.  A temporal clear-corneal main wound incision was created using a 2.38mm microkeratome.  A continuous curvilinear capsulorrhexis was initiated using an irrigating cystitome and completed using capsulorrhexis forceps.  Hydrodissection and hydrodeliniation were performed.  Viscoelastic was injected into the anterior chamber.  A phacoemulsification handpiece and a chopper as a second instrument were used to remove the nucleus and epinucleus.  The irrigation/aspiration handpiece was used to remove any remaining cortical material.   The capsular bag was reinflated with viscoelastic, checked, and found to be intact.  The intraocular lens was inserted into the capsular bag.  The irrigation/aspiration handpiece was used to remove any remaining viscoelastic.  The clear corneal wound and paracentesis wounds were then hydrated and checked with Weck-Cels to be watertight. 0.1mL of Moxfloxacin was injected into the anterior chamber. The lid-speculum was removed.  The drape was removed.  The patient's face was cleaned with a wet and dry 4x4. A clear shield was taped over the eye. The patient was taken to the post-operative care unit in good condition, having tolerated the procedure well.  Post-Op Instructions: The patient will follow up at [facility] for a same day post-operative evaluation and will receive all other orders and instructions.

## 2024-08-05 ENCOUNTER — Encounter (HOSPITAL_COMMUNITY): Payer: Self-pay | Admitting: Ophthalmology

## 2024-08-07 ENCOUNTER — Other Ambulatory Visit: Payer: Self-pay | Admitting: Family Medicine

## 2024-08-08 ENCOUNTER — Ambulatory Visit
Admission: EM | Admit: 2024-08-08 | Discharge: 2024-08-08 | Disposition: A | Discharge status: Home / Self Care | Attending: Nurse Practitioner | Admitting: Nurse Practitioner

## 2024-08-08 DIAGNOSIS — Z872 Personal history of diseases of the skin and subcutaneous tissue: Secondary | ICD-10-CM

## 2024-08-08 DIAGNOSIS — L02821 Furuncle of head [any part, except face]: Secondary | ICD-10-CM | POA: Diagnosis not present

## 2024-08-08 MED ORDER — CLOBETASOL PROPIONATE 0.05 % EX SOLN: 1.0000 | Freq: Two times a day (BID) | CUTANEOUS | 0 refills | Status: AC

## 2024-08-08 MED ORDER — SULFAMETHOXAZOLE-TRIMETHOPRIM 800-160 MG PO TABS: 1.0000 | ORAL_TABLET | Freq: Two times a day (BID) | ORAL | 0 refills | Status: AC

## 2024-08-08 NOTE — Discharge Instructions (Signed)
 Take medication as prescribed. Keep the scalp clean and dry.  Continue to avoid use of oils, hair sprays, or lotions to the hair and scalp. Apply cool compresses to the hair and scalp as needed for inflammation. Do not pick or disrupt the areas in the scalp while symptoms persist. As discussed, if symptoms fail to improve with this treatment, recommend follow-up with your primary care physician or with dermatology for further evaluation. Follow-up as needed.

## 2024-08-08 NOTE — ED Triage Notes (Signed)
 Pt reports he has an itchy stinging scalp x 3 month

## 2024-08-08 NOTE — ED Provider Notes (Signed)
 " [facility]    CSN: [id number] Arrival date & time: 08/08/24  1710      History   Chief Complaint Chief Complaint  Patient presents with   Scalp sores    HPI Steven [name] is a 74 y.o. male.   The history is provided by the patient.   Patient presents for a 12-month history of scalp irritation.  Patient has been seen by dermatology for the symptoms, states that he was prescribed doxycycline , but that he cannot tolerate the doxycycline  because it causes him to have reflux symptoms.  States that he did take the medication with food and water .  He states the areas in his scalp are painful and itchy.  States that he has not washed his hair because he was told not to use any also in his hair or scalp.  Patient also complains of irritation on his face.  Per review of his chart he has a long history of skin disorders to include perioral dermatitis, folliculitis, and tinea. Past Medical History:  Diagnosis Date   Anxiety    Arthritis    Atrial fibrillation (HCC)    Atrial fibrillation (HCC)    Atrial flutter (HCC)    BPH (benign prostatic hyperplasia)    Chest pain    Dizziness    Dyspnea    laying down occ   Dysrhythmia    Fracture 08/17/2015   MULTIPLE RIB FRACTURES     FROM FALL    GERD (gastroesophageal reflux disease)    Hemorrhoids    History of kidney stones    noted on CT scan   History of radiation therapy 08/22/11-10/13/11   prostate   Hyperlipemia    Hypertension    Hyperthyroidism    IBS (irritable bowel syndrome)    Insomnia    Light headedness    Migraine    Numbness and tingling in left arm    Numbness and tingling of both legs    Open fracture of left elbow 08/18/2015   Pre-diabetes    Prostate cancer (HCC)    prostate s/p radiation Mar 2013   Rib fractures 08/17/2015   Tinnitus     Patient Active Problem List   Diagnosis Date Noted   Perioral dermatitis 06/24/2024   Hypersomnia without long sleep time, idiopathic 05/30/2024   Allergic  rhinitis 05/30/2024   Acute bronchitis 04/30/2024   Tinea cruris 02/21/2024   Chronic pain of both knees 01/01/2024   Degeneration of intervertebral disc of lumbar region with discogenic back pain and lower extremity pain 01/01/2024   Chronic bronchitis (HCC) 11/30/2023   Chronic sinusitis 11/30/2023   Elevated PSA 11/15/2023   Contact dermatitis 06/27/2023   Inflammatory arthritis 05/23/2023   Migraine, unspecified, not intractable, without status migrainosus 05/08/2023   Depression with anxiety 04/19/2023   Mixed hyperlipidemia 03/14/2023   Prediabetes 03/14/2023   Leukopenia 11/24/2022   SVT (supraventricular tachycardia) 11/24/2022   Calculus of gallbladder without cholecystitis without obstruction 08/09/2022   Rash and nonspecific skin eruption 05/10/2022   Fibromyalgia 07/07/2020   Paresthesia of upper limb 03/04/2020   Carcinoma of prostate (HCC) 02/17/2020   Personal history of malignant neoplasm of prostate 12/12/2019   Benign prostatic hyperplasia with urinary obstruction 12/12/2019   Weak urinary stream 12/12/2019   Hyperthyroidism 11/24/2019   IBS (irritable bowel syndrome) 10/21/2019   Sleep apnea 09/20/2019   Insomnia 09/20/2019   Paroxysmal atrial flutter (HCC) 04/17/2019   Chronic atrial fibrillation (HCC) 03/02/2019   HTN (hypertension) 03/02/2019  Constipation 08/02/2017   Hemorrhoids 08/02/2017   Radiation proctitis 01/30/2013   Obese 05/30/2012   Dyspnea 05/30/2012   Elevated lipids 05/30/2012   Palpitations 05/30/2012   Malignant neoplasm of prostate (HCC) 08/13/2011    Past Surgical History:  Procedure Laterality Date   BOTOX  INJECTION N/A 01/14/2020   Procedure: INJECTION OF BOTOX  INTO ANAL SPHINCTER;  Surgeon: Teresa Lonni HERO, MD;  Location: [facility];  Service: General;  Laterality: N/A;   CATARACT EXTRACTION W/PHACO Right 08/04/2024   Procedure: PHACOEMULSIFICATION, CATARACT, WITH IOL INSERTION;  Surgeon: Harrie Agent, MD;  Location: [facility];   Service: Ophthalmology;  Laterality: Right;  CDE: 9.95   COLONOSCOPY N/A 12/19/2012   MFM:Mzrujo 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 Normal appearing colon   COLONOSCOPY WITH PROPOFOL  N/A 10/23/2019   Procedure: COLONOSCOPY WITH PROPOFOL ;  Surgeon: Shaaron Lamar HERO, MD; external and grade 2 internal hemorrhoids, abnormal rectal blood vessels consistent with radiation proctitis s/p APC therapy, otherwise normal exam.   EVALUATION UNDER ANESTHESIA WITH ANAL FISTULECTOMY N/A 01/14/2020   Procedure: ANORECTAL EXAM UNDER ANESTHESIA;  Surgeon: Teresa Lonni HERO, MD;  Location: [facility];  Service: General;  Laterality: N/A;   HOT HEMOSTASIS  10/23/2019   Procedure: HOT HEMOSTASIS (ARGON PLASMA COAGULATION/BICAP);  Surgeon: Shaaron Lamar HERO, MD;  Location: [facility];  Service: Endoscopy;;  apc rectal proctitis     KIDNEY SURGERY  1982   kidney tube collapse repair   PROSTATE BIOPSY N/A 11/15/2023   Procedure: BIOPSY, PROSTATE;  Surgeon: Sherrilee Belvie CROME, MD;  Location: [facility];  Service: Urology;  Laterality: N/A;   PROSTATE BIOPSY N/A 11/15/2023   Procedure: BIOPSY, PROSTATE, RECTAL APPROACH, WITH US  GUIDANCE;  Surgeon: Sherrilee Belvie CROME, MD;  Location: [facility];  Service: Urology;  Laterality: N/A;   RADIOACTIVE SEED IMPLANT     Prostate   SVT ABLATION N/A 12/12/2022   Procedure: SVT ABLATION;  Surgeon: Waddell Danelle ORN, MD;  Location: [facility];  Service: Cardiovascular;  Laterality: N/A;   TRANSRECTAL ULTRASOUND N/A 11/15/2023   Procedure: ULTRASOUND, RECTAL APPROACH;  Surgeon: Sherrilee Belvie CROME, MD;  Location: [facility];  Service: Urology;  Laterality: N/A;       Home Medications    Prior to Admission medications  Medication Sig Start Date End Date Taking? Authorizing Provider  clobetasol  (TEMOVATE ) 0.05 % external solution Apply 1 Application topically 2 (two) times daily. 08/08/24  Yes Leath-Warren, Etta PARAS, NP   sulfamethoxazole -trimethoprim  (BACTRIM  DS) 800-160 MG tablet Take 1 tablet by mouth 2 (two) times daily for 7 days. 08/08/24 08/15/24 Yes Leath-Warren, Etta PARAS, NP  acetaminophen  (TYLENOL ) 500 MG tablet Take 1,000 mg by mouth every 8 (eight) hours as needed for moderate pain (pain score 4-6).    [provider]  albuterol  (VENTOLIN  HFA) 108 (90 Base) MCG/ACT inhaler Inhale 2 puffs into the lungs every 4 (four) hours as needed. 04/21/24   Yolande Lamar BROCKS, MD  alfuzosin  (UROXATRAL ) 10 MG 24 hr tablet Take 1 tablet (10 mg total) by mouth daily with breakfast. 07/14/24   [name], Belvie CROME, MD  amLODipine  (NORVASC ) 10 MG tablet Take 1 tablet (10 mg total) by mouth daily. 06/11/24   Nishan, Peter C, MD  azelastine  (ASTELIN ) 0.1 % nasal spray Place 1 spray into both nostrils 2 (two) times daily. Use in each nostril as directed 09/30/23   Stuart Vernell Norris, PA-C  chlorhexidine  (HIBICLENS ) 4 % external liquid Apply a dime size  amount of the solution to warm water  and cleanse the affected areas twice daily while symptoms persist. 02/04/24   Leath-Warren, Etta PARAS, NP  Cholecalciferol (VITAMIN D3) 125 MCG (5000 UT) CAPS Take 5,000 Units by mouth daily.    [provider]  doxycycline  (VIBRAMYCIN ) 100 MG capsule Take 1 capsule (100 mg total) by mouth 2 (two) times daily. 06/24/24   Cook, Jayce G, DO  DULoxetine  (CYMBALTA ) 60 MG capsule Take 1 capsule (60 mg total) by mouth daily. 07/12/23   Onita Duos, MD  ELIQUIS  5 MG TABS tablet TAKE 1 TABLET(5 MG) BY MOUTH TWICE DAILY 05/26/24   Delford Maude BROCKS, MD  ezetimibe  (ZETIA ) 10 MG tablet Take 1 tablet (10 mg total) by mouth daily. 06/24/24   Nida, Gebreselassie W, MD  furosemide  (LASIX ) 20 MG tablet Take 1 tablet (20 mg total) by mouth daily. Take with or after meal. 04/28/24   Bevely Doffing, FNP  gabapentin  (NEURONTIN ) 400 MG capsule TAKE 1 CAPSULE(400 MG) BY MOUTH THREE TIMES DAILY 08/07/24   Del Wilhelmena Falter, Hilario, FNP   ipratropium-albuterol  (DUONEB) 0.5-2.5 (3) MG/3ML SOLN Take 3 mLs by nebulization every 4 (four) hours as needed. 04/21/24   Yolande Lamar BROCKS, MD  ketoconazole  (NIZORAL ) 2 % cream Apply topically 2 (two) times daily. 04/28/24   Bevely Doffing, FNP  losartan  (COZAAR ) 100 MG tablet Take 1 tablet (100 mg total) by mouth daily. 05/22/24   Bevely Doffing, FNP  metoprolol  tartrate (LOPRESSOR ) 25 MG tablet TAKE 1 TABLET(25 MG) BY MOUTH TWICE DAILY 07/02/24   Nishan, Peter C, MD  mirtazapine  (REMERON ) 15 MG tablet TAKE 1 TABLET(15 MG) BY MOUTH AT BEDTIME 11/15/23   Del Orbe Polanco, Iliana, FNP  omeprazole  (PRILOSEC) 20 MG capsule Take 20 mg by mouth daily. 06/12/23   [provider]  ondansetron  (ZOFRAN ) 4 MG tablet Take 1 tablet (4 mg total) by mouth every 8 (eight) hours as needed for nausea or vomiting. 03/16/23   Del Orbe Polanco, Iliana, FNP  phenazopyridine  (PYRIDIUM ) 200 MG tablet Take 1 tablet (200 mg total) by mouth 3 (three) times daily as needed for pain. 05/03/24   Stuart Vernell Norris, PA-C  polyethylene glycol (MIRALAX  / GLYCOLAX ) 17 g packet Take 17 g by mouth daily. 08/23/22   Ricky Fines, MD  potassium chloride  SA (KLOR-CON  M) 20 MEQ tablet Take 1 tablet (20 mEq total) by mouth daily. 12/04/22   Dean Clarity, MD  promethazine -dextromethorphan  (PROMETHAZINE -DM) 6.25-15 MG/5ML syrup Take 5 mLs by mouth 4 (four) times daily as needed. 04/03/24   Bacchus, Meade PEDLAR, FNP  spironolactone  (ALDACTONE ) 25 MG tablet Take 1 tablet (25 mg total) by mouth daily. 06/10/24 09/08/24  Waddell Danelle ORN, MD  Tiotropium Bromide -Olodaterol 2.5-2.5 MCG/ACT AERS Inhale 2 puffs into the lungs daily. 07/29/24   Charley Conger, PA-C  triamcinolone  cream (KENALOG ) 0.1 % Apply 1 Application topically 2 (two) times daily. 06/27/23   Del Orbe Polanco, Iliana, FNP  vitamin B-12 (CYANOCOBALAMIN ) 100 MCG tablet Take 100 mcg by mouth daily.    [provider]  gabapentin  (NEURONTIN ) 300 MG capsule Take 1  capsule (300 mg total) by mouth 3 (three) times daily as needed. 02/19/23   Del Wilhelmena Falter Hilario, FNP    Family History Family History  Problem Relation Age of Onset   Aneurysm Mother        aortic   Hypertension Mother    Headache Mother    Prostate cancer Father    Hypertension Father    Heart  failure Brother    Colon cancer Neg Hx    Colon polyps Neg Hx     Social History Social History[1]   Allergies   Doxycycline , Latex, and Meclizine   Review of Systems Review of Systems Per HPI  Physical Exam Triage Vital Signs ED Triage Vitals  Encounter Vitals Group     BP 08/08/24 1801 (!) 140/73     Girls Systolic BP Percentile --      Girls Diastolic BP Percentile --      Boys Systolic BP Percentile --      Boys Diastolic BP Percentile --      Pulse Rate 08/08/24 1801 66     Resp 08/08/24 1801 20     Temp 08/08/24 1801 98.2 F (36.8 C)     Temp Source 08/08/24 1801 Oral     SpO2 08/08/24 1801 96 %     Weight --      Height --      Head Circumference --      Peak Flow --      Pain Score 08/08/24 1800 4     Pain Loc --      Pain Education --      Exclude from Growth Chart --    No data found.  Updated Vital Signs BP (!) 140/73 (BP Location: Right Arm)   Pulse 66   Temp 98.2 F (36.8 C) (Oral)   Resp 20   SpO2 96%   Visual Acuity Right Eye Distance:   Left Eye Distance:   Bilateral Distance:    Right Eye Near:   Left Eye Near:    Bilateral Near:     Physical Exam Vitals and nursing note reviewed.  Constitutional:      Appearance: Normal appearance.  HENT:     Head: Normocephalic.  Pulmonary:     Effort: Pulmonary effort is normal.  Musculoskeletal:     Cervical back: Normal range of motion.  Skin:    General: Skin is warm and dry.     Findings: Rash present.     Comments: Erythematous lesions noted to the frontal scalp and along the hairline.  The lesions are in no congruent pattern, they do not extend into the upper septal region.  The  areas are tender to palpation.  There is no oozing, fluctuance, or drainage present.  Neurological:     General: No focal deficit present.     Mental Status: He is alert and oriented to person, place, and time.  Psychiatric:        Mood and Affect: Mood normal.        Behavior: Behavior normal.      UC Treatments / Results  Labs (all labs ordered are listed, but only abnormal results are displayed) Labs Reviewed - No data to display  EKG   Radiology No results found.  Procedures Procedures (including critical care time)  Medications Ordered in UC Medications - No data to display  Initial Impression / Assessment and Plan / UC Course  I have reviewed the triage vital signs and the nursing notes.  Pertinent labs & imaging results that were available during my care of the patient were reviewed by me and considered in my medical decision making (see chart for details).  Symptoms consistent with ongoing folliculitis.  Patient unable to tolerate doxycycline .  Will start Bactrim  DS 800/160 mg for folliculitis (review of the patient's lab work dated 04/21/2024 shows GFR greater than 60, creatinine 0.93).  Will  also treat with clobetasol  external solution for the patient to apply topically to help with inflammation in the scalp.  Supportive care recommendations were provided and discussed with the patient to include continuing to keep the scalp clean and dry and avoiding use of the oils hairsprays or lotions.  Patient was advised if symptoms fail to improve with this treatment, recommend follow-up with his primary care physician or with dermatology for further evaluation.  The patient was in agreement with this plan of care and verbalizes understanding.  All questions were answered.  Patient stable for discharge.  Final Clinical Impressions(s) / UC Diagnoses   Final diagnoses:  Folliculitis of scalp     Discharge Instructions      Take medication as prescribed. Keep the scalp clean  and dry.  Continue to avoid use of oils, hair sprays, or lotions to the hair and scalp. Apply cool compresses to the hair and scalp as needed for inflammation. Do not pick or disrupt the areas in the scalp while symptoms persist. As discussed, if symptoms fail to improve with this treatment, recommend follow-up with your primary care physician or with dermatology for further evaluation. Follow-up as needed.   ED Prescriptions     Medication Sig Dispense Auth. Provider   clobetasol  (TEMOVATE ) 0.05 % external solution Apply 1 Application topically 2 (two) times daily. 50 mL Leath-Warren, Etta PARAS, NP   sulfamethoxazole -trimethoprim  (BACTRIM  DS) 800-160 MG tablet Take 1 tablet by mouth 2 (two) times daily for 7 days. 14 tablet Leath-Warren, Etta PARAS, NP      PDMP not reviewed this encounter.    [1]  Social History Tobacco Use   Smoking status: Former    Current packs/day: 0.00    Average packs/day: 1 pack/day for 20.0 years (20.0 ttl pk-yrs)    Types: Cigarettes    Start date: 2000    Quit date: 2020    Years since quitting: 6.0    Passive exposure: Past   Smokeless tobacco: Never   Tobacco comments:    Quit smoking x 2 years    07/2015   SOMETIIMES i USE VAPOR     Smoking Cessation Classes, Agencies, Services & Resources Offered  Vaping Use   Vaping status: Never Used  Substance Use Topics   Alcohol use: Not Currently   Drug use: Never     Gilmer Etta PARAS, NP 08/08/24 1859  "

## 2024-08-08 NOTE — Anesthesia Postprocedure Evaluation (Signed)
"   Anesthesia Post Note  Patient: Steven [name]  Procedure(s) Performed: PHACOEMULSIFICATION, CATARACT, WITH IOL INSERTION (Right: Eye)  Patient location during evaluation: Phase II Anesthesia Type: MAC Level of consciousness: awake Pain management: pain level controlled Vital Signs Assessment: post-procedure vital signs reviewed and stable Respiratory status: spontaneous breathing and respiratory function stable Cardiovascular status: blood pressure returned to baseline and stable Postop Assessment: no headache and no apparent nausea or vomiting Anesthetic complications: no Comments: Late entry   No notable events documented.   Last Vitals:  Vitals:   08/04/24 1001 08/04/24 1108  BP: 137/72 134/75  Pulse: 68 66  Resp: 14 13  Temp: 37.1 C 36.6 C  SpO2: 100% 100%    Last Pain:  Vitals:   08/05/24 1434  TempSrc:   PainSc: 0-No pain                 Yvonna PARAS Brendy Ficek      "

## 2024-08-13 ENCOUNTER — Encounter (HOSPITAL_COMMUNITY)
Admission: RE | Admit: 2024-08-13 | Discharge: 2024-08-13 | Disposition: A | Discharge status: Home / Self Care | Source: Ambulatory Visit | Attending: Ophthalmology | Admitting: Ophthalmology

## 2024-08-18 ENCOUNTER — Ambulatory Visit (HOSPITAL_COMMUNITY)

## 2024-08-19 ENCOUNTER — Ambulatory Visit: Admitting: "Endocrinology

## 2024-08-19 ENCOUNTER — Encounter: Payer: Self-pay | Admitting: Pulmonary Disease

## 2024-08-19 ENCOUNTER — Telehealth (INDEPENDENT_AMBULATORY_CARE_PROVIDER_SITE_OTHER): Admitting: Pulmonary Disease

## 2024-08-19 ENCOUNTER — Encounter (HOSPITAL_BASED_OUTPATIENT_CLINIC_OR_DEPARTMENT_OTHER): Payer: Self-pay

## 2024-08-19 DIAGNOSIS — G4719 Other hypersomnia: Secondary | ICD-10-CM

## 2024-08-19 DIAGNOSIS — G4733 Obstructive sleep apnea (adult) (pediatric): Secondary | ICD-10-CM

## 2024-08-19 NOTE — Telephone Encounter (Signed)
 Sleep study interpreted: moderate OSA identified. Cc'ing ordering provider.  Lamar JINNY Dales, MD

## 2024-08-19 NOTE — Progress Notes (Signed)
 This encounter was created in error - please disregard.

## 2024-08-25 ENCOUNTER — Ambulatory Visit: Payer: Self-pay

## 2024-08-25 NOTE — Telephone Encounter (Signed)
 FYI Only or Action Required?: FYI only for provider: appointment scheduled on 08/27/24.  Patient was last seen in primary care on 06/24/2024 by Cook, Jayce G, DO.  Called Nurse Triage reporting Neurologic Problem.  Symptoms began several days ago.  Interventions attempted: Nothing.  Symptoms are: unchanged.  Triage Disposition: See PCP When Office is Open (Within 3 Days)  Patient/caregiver understands and will follow disposition?: Yes    Message from [facility] sent at 08/25/2024 10:07 AM EST  Pt feels like he's getting a brain zap like a shock in his head thinks it could be a side effect from the medication due to not taking it regurlary. Mostly happens at night when trying to go to sleep sounds like a door is slamming / boom and says it's scary.   Reason for Disposition  [1] Numbness or tingling on both sides of body AND [2] is a new symptom present > 24 hours    Denies numbness and tingling but patient has new neurological symptoms  Answer Assessment - Initial Assessment Questions Patient reports prescribed Cymbalta  60mg  for anxiety, stress and pain. Patient hasn't taken in the last 3-4 days. Reports that he forgets to take his medicine and that he is concerned he has too many pills.    1. SYMPTOM: What is the main symptom you are concerned about? (e.g., weakness, numbness)    Brain zaps - described as hearing something in your head, feels like a shock, sounds like a door is shutting  2. ONSET: When did this start? (e.g., minutes, hours, days; while sleeping)     3-4 days ago  4. PATTERN Does this come and go, or has it been constant since it started?  Is it present now?     Comes and goes, during times of sleep  6. NEUROLOGIC SYMPTOMS: Have you had any of the following symptoms: headache, dizziness, vision loss, double vision, changes in speech, unsteady on your feet?     Denies headache, weakness, numbness, visual changes, changes with speech; has nightmares and   jumping in my sleep  7. OTHER SYMPTOMS: Do you have any other symptoms?     Becomes fatigued more easily, feelings of lightheadedness, high blood pressure last night - 161/90; 133/74 during time of call  Protocols used: Neurologic Deficit-A-AH

## 2024-08-25 NOTE — Telephone Encounter (Signed)
 noted

## 2024-08-26 ENCOUNTER — Encounter (HOSPITAL_COMMUNITY): Payer: Self-pay

## 2024-08-26 ENCOUNTER — Encounter (HOSPITAL_COMMUNITY)
Admission: RE | Admit: 2024-08-26 | Discharge: 2024-08-26 | Disposition: A | Source: Ambulatory Visit | Attending: Ophthalmology

## 2024-08-27 ENCOUNTER — Encounter: Payer: Self-pay | Admitting: Internal Medicine

## 2024-08-27 ENCOUNTER — Ambulatory Visit: Payer: Self-pay | Admitting: Internal Medicine

## 2024-08-27 VITALS — BP 103/66 | HR 64 | Ht 67.0 in | Wt 232.0 lb

## 2024-08-27 DIAGNOSIS — K219 Gastro-esophageal reflux disease without esophagitis: Secondary | ICD-10-CM | POA: Insufficient documentation

## 2024-08-27 DIAGNOSIS — M51362 Other intervertebral disc degeneration, lumbar region with discogenic back pain and lower extremity pain: Secondary | ICD-10-CM | POA: Diagnosis not present

## 2024-08-27 DIAGNOSIS — F411 Generalized anxiety disorder: Secondary | ICD-10-CM | POA: Diagnosis not present

## 2024-08-27 DIAGNOSIS — F410 Panic disorder [episodic paroxysmal anxiety] without agoraphobia: Secondary | ICD-10-CM | POA: Insufficient documentation

## 2024-08-27 LAB — T3: T3, Total: 129 ng/dL (ref 71–180)

## 2024-08-27 LAB — T4, FREE: Free T4: 1.14 ng/dL (ref 0.82–1.77)

## 2024-08-27 LAB — TSH: TSH: 2.85 u[IU]/mL (ref 0.450–4.500)

## 2024-08-27 MED ORDER — LORAZEPAM 0.5 MG PO TABS: 0.5000 mg | ORAL_TABLET | Freq: Two times a day (BID) | ORAL | 0 refills | Status: AC | PRN

## 2024-08-27 MED ORDER — DULOXETINE HCL 30 MG PO CPEP: 30.0000 mg | ORAL_CAPSULE | Freq: Every day | ORAL | 1 refills | Status: AC

## 2024-08-27 MED ORDER — OMEPRAZOLE 20 MG PO CPDR: 20.0000 mg | DELAYED_RELEASE_CAPSULE | Freq: Every day | ORAL | 1 refills | Status: AC

## 2024-08-27 NOTE — Patient Instructions (Addendum)
 Please start taking Duloxetine  30 mg regularly.  Please take Lorazepam  as needed for severe anxiety/panic episode.  Please do not take Mirtazepine.

## 2024-08-27 NOTE — Assessment & Plan Note (Signed)
 Reports overall improvement with Cymbalta , discussed about continuing at the lower dose and taking it regularly Decrease dose of Cymbalta  to 30 mg QD for now Simple relaxation techniques advised Ativan  as needed for severe anxiety/panic episode

## 2024-08-27 NOTE — Assessment & Plan Note (Signed)
 Overall well controlled with omeprazole , refilled Avoid hot and spicy food

## 2024-08-27 NOTE — Progress Notes (Signed)
 "  Acute Office Visit  Subjective:    Patient ID: Steven [name], male    DOB: 1950/08/23, 74 y.o.   MRN: [id number]  Chief Complaint  Patient presents with   Panic Attack    Hearing loud noise at nighttime, has been taking Cymbalta  inconsistently    HPI Patient is in today for follow-up after an episode of severe anxiety 2 days ago at nighttime.  Reports hearing loud thudding noise, as if the door was banging for about 30 minutes.  He also reports chest pressure and sweating.  He reports that he has not been taking Cymbalta  60 mg regularly, and has been skipping doses.  He is concerned about the dose of Cymbalta , and had decided to not take it regularly.  Of note, he reports improvement in neuropathic pain and anxiety with it, but prefers to take a lower dose of it or discontinue it.  After discussion, he agrees to take lower dose of Cymbalta  regularly.  Past Medical History:  Diagnosis Date   Anxiety    Arthritis    Atrial fibrillation (HCC)    Atrial fibrillation (HCC)    Atrial flutter (HCC)    BPH (benign prostatic hyperplasia)    Chest pain    Dizziness    Dyspnea    laying down occ   Dysrhythmia    Fracture 08/17/2015   MULTIPLE RIB FRACTURES     FROM FALL    GERD (gastroesophageal reflux disease)    Hemorrhoids    History of kidney stones    noted on CT scan   History of radiation therapy 08/22/11-10/13/11   prostate   Hyperlipemia    Hypertension    Hyperthyroidism    IBS (irritable bowel syndrome)    Insomnia    Light headedness    Migraine    Numbness and tingling in left arm    Numbness and tingling of both legs    Open fracture of left elbow 08/18/2015   Pre-diabetes    Prostate cancer [facility])    prostate s/p radiation Mar 2013   Rib fractures 08/17/2015   Tinnitus     Past Surgical History:  Procedure Laterality Date   BOTOX  INJECTION N/A 01/14/2020   Procedure: INJECTION OF BOTOX  INTO ANAL SPHINCTER;  Surgeon: Teresa Lonni HERO, MD;  Location: [facility]  ORS;  Service: General;  Laterality: N/A;   CATARACT EXTRACTION W/PHACO Right 08/04/2024   Procedure: PHACOEMULSIFICATION, CATARACT, WITH IOL INSERTION;  Surgeon: Harrie Lynwood, MD;  Location: [facility] ORS;  Service: Ophthalmology;  Laterality: Right;  CDE: 9.95   COLONOSCOPY N/A 12/19/2012   MFM:Mzrujo 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 Normal appearing colon   COLONOSCOPY WITH PROPOFOL  N/A 10/23/2019   Procedure: COLONOSCOPY WITH PROPOFOL ;  Surgeon: Shaaron Lamar HERO, MD; external and grade 2 internal hemorrhoids, abnormal rectal blood vessels consistent with radiation proctitis s/p APC therapy, otherwise normal exam.   EVALUATION UNDER ANESTHESIA WITH ANAL FISTULECTOMY N/A 01/14/2020   Procedure: ANORECTAL EXAM UNDER ANESTHESIA;  Surgeon: Teresa Lonni HERO, MD;  Location: [facility] ORS;  Service: General;  Laterality: N/A;   HOT HEMOSTASIS  10/23/2019   Procedure: HOT HEMOSTASIS (ARGON PLASMA COAGULATION/BICAP);  Surgeon: Shaaron Lamar HERO, MD;  Location: [facility] ENDO SUITE;  Service: Endoscopy;;  apc rectal proctitis     KIDNEY SURGERY  1982   kidney tube collapse repair   PROSTATE BIOPSY N/A 11/15/2023   Procedure: BIOPSY, PROSTATE;  Surgeon: Sherrilee Belvie CROME, MD;  Location: [facility] ORS;  Service: Urology;  Laterality: N/A;   PROSTATE BIOPSY N/A 11/15/2023   Procedure: BIOPSY, PROSTATE, RECTAL APPROACH, WITH US  GUIDANCE;  Surgeon: Sherrilee Belvie CROME, MD;  Location: [facility] ORS;  Service: Urology;  Laterality: N/A;   RADIOACTIVE SEED IMPLANT     Prostate   SVT ABLATION N/A 12/12/2022   Procedure: SVT ABLATION;  Surgeon: Waddell Danelle ORN, MD;  Location: [facility] INVASIVE CV LAB;  Service: Cardiovascular;  Laterality: N/A;   TRANSRECTAL ULTRASOUND N/A 11/15/2023   Procedure: ULTRASOUND, RECTAL APPROACH;  Surgeon: Sherrilee Belvie CROME, MD;  Location: [facility] ORS;  Service: Urology;  Laterality: N/A;    Family History  Problem Relation Age of Onset   Aneurysm Mother         aortic   Hypertension Mother    Headache Mother    Prostate cancer Father    Hypertension Father    Heart failure Brother    Colon cancer Neg Hx    Colon polyps Neg Hx     Social History   Socioeconomic History   Marital status: Single    Spouse name: Not on file   Number of children: 2   Years of education: 12   Highest education level: 12th grade  Occupational History   Occupation: Custodian     Employer: [employer]  Tobacco Use   Smoking status: Former    Current packs/day: 0.00    Average packs/day: 1 pack/day for 20.0 years (20.0 ttl pk-yrs)    Types: Cigarettes    Start date: 2000    Quit date: 2020    Years since quitting: 6.0    Passive exposure: Past   Smokeless tobacco: Never   Tobacco comments:    Quit smoking x 2 years    07/2015   SOMETIIMES i USE VAPOR     Smoking Cessation Classes, Agencies, Aeronautical Engineer Use   Vaping status: Never Used  Substance and Sexual Activity   Alcohol use: Not Currently   Drug use: Never   Sexual activity: Not Currently    Partners: Female  Other Topics Concern   Not on file  Social History Narrative   Lives with friend   Divorced   No caffeine   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   Social Drivers of Health   Tobacco Use: Medium Risk (08/27/2024)   Patient History    Smoking Tobacco Use: Former    Smokeless Tobacco Use: Never    Passive Exposure: Past  Physicist, Medical Strain: Low Risk (04/28/2024)   Overall Financial Resource Strain (CARDIA)    Difficulty of Paying Living Expenses: Not hard at all  Food Insecurity: Low Risk (07/10/2024)   Received from [facility]   Epic    Within the past 12 months, you worried that your food would run out before you got money to buy more: Never true    Within the past 12 months, the food you bought just didn't last and you didn't have money to get more. : Never true  Transportation Needs: No Transportation Needs  (07/10/2024)   Received from [facility]    In the past 12 months, has lack of reliable transportation kept you from medical appointments, meetings, work or from getting things needed for daily living? : No  Physical Activity: Inactive (04/28/2024)   Exercise Vital Sign    Days of Exercise per Week: 0 days    Minutes of Exercise per  Session: 0 min  Stress: No Stress Concern Present (04/28/2024)   [employer] of Occupational Health - Occupational Stress Questionnaire    Feeling of Stress: Only a little  Social Connections: Socially Isolated (04/28/2024)   Social Connection and Isolation Panel    Frequency of Communication with Friends and Family: More than three times a week    Frequency of Social Gatherings with Friends and Family: Once a week    Attends Religious Services: Never    Database Administrator or Organizations: No    Attends Banker Meetings: Never    Marital Status: Widowed  Intimate Partner Violence: Not At Risk (04/28/2024)   Epic    Fear of Current or Ex-Partner: No    Emotionally Abused: No    Physically Abused: No    Sexually Abused: No  Depression (PHQ2-9): Low Risk (08/27/2024)   Depression (PHQ2-9)    PHQ-2 Score: 0  Alcohol Screen: Low Risk (04/28/2024)   Alcohol Screen    Last Alcohol Screening Score (AUDIT): 0  Housing: Low Risk (07/10/2024)   Received from [facility]   Epic    What is your living situation today?: I have a steady place to live    Think about the place you live. Do you have problems with any of the following? Choose all that apply:: None/None on this list  Utilities: Low Risk (07/10/2024)   Received from [facility]   Utilities    In the past 12 months has the electric, gas, oil, or water  company threatened to shut off services in your home? : No  Health Literacy: Adequate Health Literacy (04/28/2024)   B1300 Health Literacy    Frequency of need for help with medical instructions: Never     Outpatient Medications Prior to Visit  Medication Sig Dispense Refill   acetaminophen  (TYLENOL ) 500 MG tablet Take 1,000 mg by mouth every 8 (eight) hours as needed for moderate pain (pain score 4-6).     albuterol  (VENTOLIN  HFA) 108 (90 Base) MCG/ACT inhaler Inhale 2 puffs into the lungs every 4 (four) hours as needed. 18 g 2   alfuzosin  (UROXATRAL ) 10 MG 24 hr tablet Take 1 tablet (10 mg total) by mouth daily with breakfast. 90 tablet 3   amLODipine  (NORVASC ) 10 MG tablet Take 1 tablet (10 mg total) by mouth daily. 90 tablet 3   azelastine  (ASTELIN ) 0.1 % nasal spray Place 1 spray into both nostrils 2 (two) times daily. Use in each nostril as directed 30 mL 0   chlorhexidine  (HIBICLENS ) 4 % external liquid Apply a dime size amount of the solution to warm water  and cleanse the affected areas twice daily while symptoms persist. 118 mL 0   Cholecalciferol (VITAMIN D3) 125 MCG (5000 UT) CAPS Take 5,000 Units by mouth daily.     clobetasol  (TEMOVATE ) 0.05 % external solution Apply 1 Application topically 2 (two) times daily. 50 mL 0   ELIQUIS  5 MG TABS tablet TAKE 1 TABLET(5 MG) BY MOUTH TWICE DAILY 60 tablet 5   ezetimibe  (ZETIA ) 10 MG tablet Take 1 tablet (10 mg total) by mouth daily. 90 tablet 0   furosemide  (LASIX ) 20 MG tablet Take 1 tablet (20 mg total) by mouth daily. Take with or after meal. 90 tablet 1   gabapentin  (NEURONTIN ) 400 MG capsule TAKE 1 CAPSULE(400 MG) BY MOUTH THREE TIMES DAILY 90 capsule 3   ipratropium-albuterol  (DUONEB) 0.5-2.5 (3) MG/3ML SOLN Take 3 mLs by nebulization every 4 (four) hours as needed.  360 mL 2   ketoconazole  (NIZORAL ) 2 % cream Apply topically 2 (two) times daily. 30 g 3   losartan  (COZAAR ) 100 MG tablet Take 1 tablet (100 mg total) by mouth daily. 90 tablet 1   metoprolol  tartrate (LOPRESSOR ) 25 MG tablet TAKE 1 TABLET(25 MG) BY MOUTH TWICE DAILY 180 tablet 1   ondansetron  (ZOFRAN ) 4 MG tablet Take 1 tablet (4 mg total) by mouth every 8 (eight) hours as  needed for nausea or vomiting. 20 tablet 1   phenazopyridine  (PYRIDIUM ) 200 MG tablet Take 1 tablet (200 mg total) by mouth 3 (three) times daily as needed for pain. 6 tablet 0   polyethylene glycol (MIRALAX  / GLYCOLAX ) 17 g packet Take 17 g by mouth daily. 30 each 2   potassium chloride  SA (KLOR-CON  M) 20 MEQ tablet Take 1 tablet (20 mEq total) by mouth daily. 30 tablet 0   promethazine -dextromethorphan  (PROMETHAZINE -DM) 6.25-15 MG/5ML syrup Take 5 mLs by mouth 4 (four) times daily as needed. 118 mL 0   spironolactone  (ALDACTONE ) 25 MG tablet Take 1 tablet (25 mg total) by mouth daily. 90 tablet 3   Tiotropium Bromide -Olodaterol 2.5-2.5 MCG/ACT AERS Inhale 2 puffs into the lungs daily. 4 g 3   triamcinolone  cream (KENALOG ) 0.1 % Apply 1 Application topically 2 (two) times daily. 30 g 0   vitamin B-12 (CYANOCOBALAMIN ) 100 MCG tablet Take 100 mcg by mouth daily.     doxycycline  (VIBRAMYCIN ) 100 MG capsule Take 1 capsule (100 mg total) by mouth 2 (two) times daily. 60 capsule 1   DULoxetine  (CYMBALTA ) 60 MG capsule Take 1 capsule (60 mg total) by mouth daily. 90 capsule 3   mirtazapine  (REMERON ) 15 MG tablet TAKE 1 TABLET(15 MG) BY MOUTH AT BEDTIME 30 tablet 2   omeprazole  (PRILOSEC) 20 MG capsule Take 20 mg by mouth daily.     No facility-administered medications prior to visit.    Allergies[1]  Review of Systems  Constitutional:  Negative for chills and fever.  HENT:  Negative for congestion and sore throat.   Eyes:  Negative for pain and discharge.  Respiratory:  Negative for cough and shortness of breath.   Cardiovascular:  Negative for chest pain and palpitations.  Gastrointestinal:  Negative for diarrhea, nausea and vomiting.  Endocrine: Negative for polydipsia and polyuria.  Genitourinary:  Negative for dysuria and hematuria.  Musculoskeletal:  Positive for arthralgias. Negative for neck pain and neck stiffness.  Skin:  Negative for rash.  Neurological:  Negative for dizziness and  weakness.  Psychiatric/Behavioral:  Positive for sleep disturbance. Negative for agitation and behavioral problems. The patient is nervous/anxious.        Objective:    Physical Exam Vitals reviewed.  Constitutional:      General: He is not in acute distress.    Appearance: He is not diaphoretic.  HENT:     Head: Normocephalic and atraumatic.     Nose: Nose normal.     Mouth/Throat:     Mouth: Mucous membranes are moist.  Eyes:     General: No scleral icterus.    Extraocular Movements: Extraocular movements intact.  Cardiovascular:     Rate and Rhythm: Normal rate and regular rhythm.     Heart sounds: Normal heart sounds. No murmur heard. Pulmonary:     Breath sounds: Normal breath sounds. No wheezing or rales.  Musculoskeletal:     Cervical back: Neck supple. No tenderness.     Right lower leg: No edema.  Left lower leg: No edema.  Skin:    General: Skin is warm.     Findings: No rash.  Neurological:     General: No focal deficit present.     Mental Status: He is alert and oriented to person, place, and time.  Psychiatric:        Mood and Affect: Mood is anxious.        Behavior: Behavior normal.     BP 103/66   Pulse 64   Ht 5' 7 (1.702 m)   Wt 232 lb (105.2 kg)   SpO2 98%   BMI 36.34 kg/m  Wt Readings from Last 3 Encounters:  08/27/24 232 lb (105.2 kg)  08/04/24 229 lb (103.9 kg)  07/29/24 229 lb (103.9 kg)        Assessment & Plan:   Problem List Items Addressed This Visit       Digestive   Gastroesophageal reflux disease   Overall well controlled with omeprazole , refilled Avoid hot and spicy food      Relevant Medications   omeprazole  (PRILOSEC) 20 MG capsule     Musculoskeletal and Integument   Degeneration of intervertebral disc of lumbar region with discogenic back pain and lower extremity pain   Chronic low back pain, with radicular symptoms to RLE Previous x-ray of the lumbar spine reviewed On gabapentin  400 mg 3 times daily and  Cymbalta , can help with neuropathic symptoms Unable to take oral NSAIDs Tylenol  arthritis as needed for mild to moderate pain Avoid heavy lifting and frequent bending        Other   GAD (generalized anxiety disorder) - Primary   Reports overall improvement with Cymbalta , discussed about continuing at the lower dose and taking it regularly Decrease dose of Cymbalta  to 30 mg QD for now Simple relaxation techniques advised Ativan  as needed for severe anxiety/panic episode      Relevant Medications   DULoxetine  (CYMBALTA ) 30 MG capsule   LORazepam  (ATIVAN ) 0.5 MG tablet   Panic attack   His recent episode of hearing loud noises, chest pressure and sweating likely due to panic attack Needs to take Cymbalta  regularly Prescribed Ativan  as needed for severe anxiety/panic episode-PDMP reviewed      Relevant Medications   DULoxetine  (CYMBALTA ) 30 MG capsule   LORazepam  (ATIVAN ) 0.5 MG tablet     Meds ordered this encounter  Medications   DULoxetine  (CYMBALTA ) 30 MG capsule    Sig: Take 1 capsule (30 mg total) by mouth daily.    Dispense:  90 capsule    Refill:  1    Dose change - 08/27/24   LORazepam  (ATIVAN ) 0.5 MG tablet    Sig: Take 1 tablet (0.5 mg total) by mouth 2 (two) times daily as needed for anxiety.    Dispense:  10 tablet    Refill:  0   omeprazole  (PRILOSEC) 20 MG capsule    Sig: Take 1 capsule (20 mg total) by mouth daily.    Dispense:  90 capsule    Refill:  1     Cinthya Bors MARLA Blanch, MD     [1]  Allergies Allergen Reactions   Doxycycline      Causes acid reflux    Latex Rash and Other (See Comments)    Possible reaction to latex gloves per patient   Meclizine Other (See Comments)    Other reaction(s): Abdominal Pain, Other   "

## 2024-08-27 NOTE — Assessment & Plan Note (Signed)
 His recent episode of hearing loud noises, chest pressure and sweating likely due to panic attack Needs to take Cymbalta  regularly Prescribed Ativan  as needed for severe anxiety/panic episode-PDMP reviewed

## 2024-08-27 NOTE — Assessment & Plan Note (Signed)
 Chronic low back pain, with radicular symptoms to RLE Previous x-ray of the lumbar spine reviewed On gabapentin  400 mg 3 times daily and Cymbalta , can help with neuropathic symptoms Unable to take oral NSAIDs Tylenol  arthritis as needed for mild to moderate pain Avoid heavy lifting and frequent bending

## 2024-08-29 ENCOUNTER — Ambulatory Visit (HOSPITAL_COMMUNITY): Admitting: Anesthesiology

## 2024-08-29 ENCOUNTER — Encounter (HOSPITAL_COMMUNITY): Payer: Self-pay | Admitting: Ophthalmology

## 2024-08-29 ENCOUNTER — Other Ambulatory Visit: Payer: Self-pay

## 2024-08-29 ENCOUNTER — Ambulatory Visit (HOSPITAL_COMMUNITY)
Admission: RE | Admit: 2024-08-29 | Discharge: 2024-08-29 | Disposition: A | Source: Home / Self Care | Attending: Ophthalmology | Admitting: Ophthalmology

## 2024-08-29 ENCOUNTER — Encounter (HOSPITAL_COMMUNITY): Admission: RE | Payer: Self-pay | Source: Home / Self Care

## 2024-08-29 MED ORDER — BSS IO SOLN
INTRAOCULAR | Status: DC | PRN
  Administered 2024-08-29: 15 mL via INTRAOCULAR

## 2024-08-29 MED ORDER — SODIUM HYALURONATE 10 MG/ML IO SOLUTION
PREFILLED_SYRINGE | INTRAOCULAR | Status: DC | PRN
  Administered 2024-08-29: .85 mL via INTRAOCULAR

## 2024-08-29 MED ORDER — SODIUM HYALURONATE 23MG/ML IO SOSY
PREFILLED_SYRINGE | INTRAOCULAR | Status: DC | PRN
  Administered 2024-08-29: .6 mL via INTRAOCULAR

## 2024-08-29 MED ORDER — MOXIFLOXACIN HCL 5 MG/ML IO SOLN
INTRAOCULAR | Status: DC | PRN
  Administered 2024-08-29: .2 mL via INTRACAMERAL

## 2024-08-29 MED ORDER — LACTATED RINGERS IV SOLN: INTRAVENOUS | Status: DC

## 2024-08-29 MED ORDER — TETRACAINE HCL 0.5 % OP SOLN
1.0000 [drp] | OPHTHALMIC | Status: AC | PRN
  Administered 2024-08-29 (×3): 1 [drp] via OPHTHALMIC

## 2024-08-29 MED ORDER — TROPICAMIDE 1 % OP SOLN
1.0000 [drp] | OPHTHALMIC | Status: AC | PRN
  Administered 2024-08-29 (×3): 1 [drp] via OPHTHALMIC

## 2024-08-29 MED ORDER — SODIUM CHLORIDE 0.9% FLUSH
INTRAVENOUS | Status: DC | PRN
  Administered 2024-08-29: 5 mL via INTRAVENOUS

## 2024-08-29 MED ORDER — LIDOCAINE HCL 3.5 % OP GEL
1.0000 | Freq: Once | OPHTHALMIC | Status: AC
  Administered 2024-08-29: 1 via OPHTHALMIC

## 2024-08-29 MED ORDER — STERILE WATER FOR IRRIGATION IR SOLN
Status: DC | PRN
  Administered 2024-08-29: 1

## 2024-08-29 MED ORDER — PHENYLEPHRINE HCL 2.5 % OP SOLN
1.0000 [drp] | OPHTHALMIC | Status: AC | PRN
  Administered 2024-08-29 (×3): 1 [drp] via OPHTHALMIC

## 2024-08-29 MED ORDER — POVIDONE-IODINE 5 % OP SOLN
OPHTHALMIC | Status: DC | PRN
  Administered 2024-08-29: 1 via OPHTHALMIC

## 2024-08-29 MED ORDER — MIDAZOLAM HCL (PF) 2 MG/2ML IJ SOLN
INTRAMUSCULAR | Status: DC | PRN
  Administered 2024-08-29: 1 mg via INTRAVENOUS

## 2024-08-29 MED ORDER — MIDAZOLAM HCL 2 MG/2ML IJ SOLN
INTRAMUSCULAR | Status: AC
  Filled 2024-08-29: qty 2

## 2024-08-29 MED ORDER — PHENYLEPHRINE-KETOROLAC 1-0.3 % IO SOLN
INTRAOCULAR | Status: DC | PRN
  Administered 2024-08-29: 500 mL via OPHTHALMIC

## 2024-08-29 MED ORDER — LIDOCAINE HCL (PF) 1 % IJ SOLN
INTRAMUSCULAR | Status: DC | PRN
  Administered 2024-08-29: 1 mL

## 2024-08-29 NOTE — Anesthesia Procedure Notes (Signed)
 Date/Time: 08/29/2024 10:03 AM  Performed by: Barbarann Verneita RAMAN, CRNAPre-anesthesia Checklist: Patient identified, Emergency Drugs available, Suction available, Timeout performed and Patient being monitored Patient Re-evaluated:Patient Re-evaluated prior to induction Oxygen  Delivery Method: Nasal Cannula

## 2024-08-29 NOTE — Anesthesia Postprocedure Evaluation (Signed)
"   Anesthesia Post Note  Patient: Steven [name]  Procedure(s) Performed: PHACOEMULSIFICATION, CATARACT, WITH IOL INSERTION (Left: Eye)  Patient location during evaluation: Phase II Anesthesia Type: MAC Level of consciousness: awake Pain management: pain level controlled Vital Signs Assessment: post-procedure vital signs reviewed and stable Respiratory status: spontaneous breathing and respiratory function stable Cardiovascular status: blood pressure returned to baseline and stable Postop Assessment: no headache and no apparent nausea or vomiting Anesthetic complications: no Comments: Late entry   No notable events documented.   Last Vitals:  Vitals:   08/29/24 0915 08/29/24 1026  BP: (!) 141/64 121/67  Pulse: 61 64  Resp: 16 19  Temp: 36.9 C 37 C  SpO2: 100% 100%    Last Pain:  Vitals:   08/29/24 1026  TempSrc: Oral  PainSc: 0-No pain                 Yvonna PARAS Anysa Tacey      "

## 2024-08-29 NOTE — Interval H&P Note (Signed)
 History and Physical Interval Note:  08/29/2024 9:56 AM  Steven [name] D Moscoso  has presented today for surgery, with the diagnosis of combined forms age related cataract, left eye.  The various methods of treatment have been discussed with the patient and family. After consideration of risks, benefits and other options for treatment, the patient has consented to  Procedures with comments: PHACOEMULSIFICATION, CATARACT, WITH IOL INSERTION (Left) - CDE: as a surgical intervention.  The patient's history has been reviewed, patient examined, no change in status, stable for surgery.  I have reviewed the patient's chart and labs.  Questions were answered to the patient's satisfaction.     HARRIE AGENT

## 2024-08-29 NOTE — Discharge Instructions (Signed)
 Please discharge patient when stable, will follow up today with Dr. June Leap at the [facility] office immediately following discharge.  Leave shield in place until visit.  All paperwork with discharge instructions will be given at the office.  [facility] Address:  [address]  [place], [place] [address]

## 2024-08-29 NOTE — Transfer of Care (Signed)
 Immediate Anesthesia Transfer of Care Note  Patient: Steven [name]  Procedure(s) Performed: PHACOEMULSIFICATION, CATARACT, WITH IOL INSERTION (Left: Eye)  Patient Location: Endoscopy Unit  Anesthesia Type:MAC  Level of Consciousness: awake and patient cooperative  Airway & Oxygen  Therapy: Patient Spontanous Breathing  Post-op Assessment: Report given to RN and Post -op Vital signs reviewed and stable  Post vital signs: Reviewed and stable  Last Vitals:  Vitals Value Taken Time  BP 121/67 08/29/24 10:26  Temp 37 C 08/29/24 10:26  Pulse 64 08/29/24 10:26  Resp 19 08/29/24 10:26  SpO2 100 % 08/29/24 10:26    Last Pain:  Vitals:   08/29/24 1026  TempSrc: Oral  PainSc: 0-No pain         Complications: No notable events documented.

## 2024-08-29 NOTE — Anesthesia Preprocedure Evaluation (Signed)
"                                    Anesthesia Evaluation  Patient identified by MRN, date of birth, ID band Patient awake    Reviewed: Allergy & Precautions, H&P , NPO status , Patient's Chart, lab work & pertinent test results, reviewed documented beta blocker date and time   Airway Mallampati: II  TM Distance: >3 FB Neck ROM: full    Dental no notable dental hx.    Pulmonary neg pulmonary ROS, shortness of breath, sleep apnea , former smoker   Pulmonary exam normal breath sounds clear to auscultation       Cardiovascular Exercise Tolerance: Good hypertension, negative cardio ROS + dysrhythmias Atrial Fibrillation  Rhythm:regular Rate:Normal     Neuro/Psych  Headaches PSYCHIATRIC DISORDERS Anxiety Depression     Neuromuscular disease negative neurological ROS  negative psych ROS   GI/Hepatic negative GI ROS, Neg liver ROS,GERD  ,,  Endo/Other  negative endocrine ROS Hyperthyroidism   Renal/GU negative Renal ROS  negative genitourinary   Musculoskeletal   Abdominal   Peds  Hematology negative hematology ROS (+)   Anesthesia Other Findings   Reproductive/Obstetrics negative OB ROS                              Anesthesia Physical Anesthesia Plan  ASA: 3  Anesthesia Plan: MAC   Post-op Pain Management:    Induction:   PONV Risk Score and Plan:   Airway Management Planned:   Additional Equipment:   Intra-op Plan:   Post-operative Plan:   Informed Consent: I have reviewed the patients History and Physical, chart, labs and discussed the procedure including the risks, benefits and alternatives for the proposed anesthesia with the patient or authorized representative who has indicated his/her understanding and acceptance.     Dental Advisory Given  Plan Discussed with: CRNA  Anesthesia Plan Comments:         Anesthesia Quick Evaluation  "

## 2024-08-29 NOTE — Op Note (Signed)
 Date of procedure: 08/29/24  Pre-operative diagnosis: Visually significant age-related combined cataract, Left Eye (H25.812)  Post-operative diagnosis: Visually significant age-related combined cataract, Left Eye (H25.812)  Procedure: Removal of cataract via phacoemulsification and insertion of intra-ocular lens Vicci and Johnson DIB00 +21.5D into the capsular bag of the Left Eye  Attending surgeon: Lynwood LABOR. Keyunna Coco, MD, MA  Anesthesia: MAC, Topical Akten   Complications: None  Estimated Blood Loss: <72mL (minimal)  Specimens: None  Implants: As above  Indications:  Visually significant age-related cataract, Left Eye  Procedure:  The patient was seen and identified in the pre-operative area. The operative eye was identified and dilated.  The operative eye was marked.  Topical anesthesia was administered to the operative eye.     The patient was then to the operative suite and placed in the supine position.  A timeout was performed confirming the patient, procedure to be performed, and all other relevant information.   The patient's face was prepped and draped in the usual fashion for intra-ocular surgery.  A lid speculum was placed into the operative eye and the surgical microscope moved into place and focused.  An inferotemporal paracentesis was created using a 20 gauge paracentesis blade. Omidria  was injected into the anterior chamber. Shugarcaine was injected into the anterior chamber.  Viscoelastic was injected into the anterior chamber.  A temporal clear-corneal main wound incision was created using a 2.37mm microkeratome.  A continuous curvilinear capsulorrhexis was initiated using an irrigating cystitome and completed using capsulorrhexis forceps.  Hydrodissection and hydrodeliniation were performed.  Viscoelastic was injected into the anterior chamber.  A phacoemulsification handpiece and a chopper as a second instrument were used to remove the nucleus and epinucleus. The  irrigation/aspiration handpiece was used to remove any remaining cortical material.   The capsular bag was reinflated with viscoelastic, checked, and found to be intact.  The intraocular lens was inserted into the capsular bag.  The irrigation/aspiration handpiece was used to remove any remaining viscoelastic.  The clear corneal wound and paracentesis wounds were then hydrated and checked with Weck-Cels to be watertight. 0.1mL of Moxfloxacin was injected into the anterior chamber. The lid-speculum was removed.  The drape was removed.  The patient's face was cleaned with a wet and dry 4x4.    A clear shield was taped over the eye. The patient was taken to the post-operative care unit in good condition, having tolerated the procedure well.  Post-Op Instructions: The patient will follow up at [facility] for a same day post-operative evaluation and will receive all other orders and instructions.

## 2024-09-04 ENCOUNTER — Ambulatory Visit: Admitting: "Endocrinology

## 2024-09-13 ENCOUNTER — Ambulatory Visit (HOSPITAL_COMMUNITY)

## 2024-09-18 ENCOUNTER — Ambulatory Visit

## 2025-01-13 ENCOUNTER — Other Ambulatory Visit

## 2025-01-28 ENCOUNTER — Ambulatory Visit: Admitting: Urology

## 2025-04-29 ENCOUNTER — Ambulatory Visit
